# Patient Record
Sex: Male | Born: 1985
Health system: Southern US, Community
[De-identification: ages and names within clinical notes are randomized; demographics above are authoritative.]

## PROBLEM LIST (undated history)

## (undated) DIAGNOSIS — E7800 Pure hypercholesterolemia, unspecified: Secondary | ICD-10-CM

## (undated) DIAGNOSIS — I10 Essential (primary) hypertension: Secondary | ICD-10-CM

## (undated) DIAGNOSIS — F909 Attention-deficit hyperactivity disorder, unspecified type: Secondary | ICD-10-CM

## (undated) DIAGNOSIS — Z992 Dependence on renal dialysis: Secondary | ICD-10-CM

## (undated) DIAGNOSIS — N186 End stage renal disease: Secondary | ICD-10-CM

## (undated) HISTORY — PX: FRACTURE SURGERY: SHX138

## (undated) HISTORY — DX: End stage renal disease: N18.6

## (undated) HISTORY — DX: End stage renal disease: Z99.2

---
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
## 2006-03-03 ENCOUNTER — Emergency Department (HOSPITAL_COMMUNITY): Admission: EM | Admit: 2006-03-03 | Discharge: 2006-03-03 | Payer: Self-pay | Admitting: Emergency Medicine

## 2007-10-11 ENCOUNTER — Inpatient Hospital Stay (HOSPITAL_COMMUNITY): Admission: EM | Admit: 2007-10-11 | Discharge: 2007-10-14 | Payer: Self-pay | Admitting: Emergency Medicine

## 2008-05-30 ENCOUNTER — Emergency Department (HOSPITAL_COMMUNITY): Admission: EM | Admit: 2008-05-30 | Discharge: 2008-05-30 | Payer: Self-pay | Admitting: Emergency Medicine

## 2009-02-17 ENCOUNTER — Observation Stay (HOSPITAL_COMMUNITY): Admission: EM | Admit: 2009-02-17 | Discharge: 2009-02-17 | Payer: Self-pay | Admitting: Emergency Medicine

## 2010-10-17 LAB — POCT I-STAT, CHEM 8
Glucose, Bld: 643 mg/dL (ref 70–99)
HCT: 47 % (ref 39.0–52.0)
Hemoglobin: 16 g/dL (ref 13.0–17.0)
Potassium: 4 mEq/L (ref 3.5–5.1)
TCO2: 22 mmol/L (ref 0–100)

## 2010-10-17 LAB — URINALYSIS, ROUTINE W REFLEX MICROSCOPIC
Leukocytes, UA: NEGATIVE
Nitrite: NEGATIVE
Specific Gravity, Urine: 1.042 — ABNORMAL HIGH (ref 1.005–1.030)
Urobilinogen, UA: 0.2 mg/dL (ref 0.0–1.0)
pH: 5 (ref 5.0–8.0)

## 2010-10-17 LAB — CBC
HCT: 42.7 % (ref 39.0–52.0)
MCHC: 33.5 g/dL (ref 30.0–36.0)
MCV: 80.9 fL (ref 78.0–100.0)
Platelets: 276 10*3/uL (ref 150–400)
RBC: 5.27 MIL/uL (ref 4.22–5.81)

## 2010-10-17 LAB — DIFFERENTIAL
Basophils Relative: 0 % (ref 0–1)
Eosinophils Absolute: 0 10*3/uL (ref 0.0–0.7)
Eosinophils Relative: 1 % (ref 0–5)
Monocytes Relative: 7 % (ref 3–12)
Neutrophils Relative %: 69 % (ref 43–77)

## 2010-10-17 LAB — GLUCOSE, CAPILLARY
Glucose-Capillary: 161 mg/dL — ABNORMAL HIGH (ref 70–99)
Glucose-Capillary: 290 mg/dL — ABNORMAL HIGH (ref 70–99)
Glucose-Capillary: 351 mg/dL — ABNORMAL HIGH (ref 70–99)

## 2010-10-17 LAB — POCT I-STAT 3, VENOUS BLOOD GAS (G3P V)
Bicarbonate: 25.1 mEq/L — ABNORMAL HIGH (ref 20.0–24.0)
TCO2: 26 mmol/L (ref 0–100)
pCO2, Ven: 40 mmHg — ABNORMAL LOW (ref 45.0–50.0)
pH, Ven: 7.405 — ABNORMAL HIGH (ref 7.250–7.300)
pO2, Ven: 48 mmHg — ABNORMAL HIGH (ref 30.0–45.0)

## 2010-10-17 LAB — BASIC METABOLIC PANEL
CO2: 24 mEq/L (ref 19–32)
Chloride: 95 mEq/L — ABNORMAL LOW (ref 96–112)
Creatinine, Ser: 0.79 mg/dL (ref 0.4–1.5)
GFR calc Af Amer: >60 mL/min (ref >60)
Potassium: 4 mEq/L (ref 3.5–5.1)
Sodium: 130 mEq/L — ABNORMAL LOW (ref 135–145)

## 2010-10-17 LAB — CULTURE, ROUTINE-ABSCESS

## 2010-10-17 LAB — URINE MICROSCOPIC-ADD ON

## 2010-10-17 LAB — KETONES, QUALITATIVE: Acetone, Bld: NEGATIVE

## 2010-11-24 NOTE — H&P (Signed)
NAME:  [name], [name]               ACCOUNT NO.:  [id number]   MEDICAL RECORD NO.:  [id number]          PATIENT TYPE:  INP   LOCATION:  [id number]                         FACILITY:  [facility]   PHYSICIAN:  Hind I Elsaid, MD      DATE OF BIRTH:  [birth year]   DATE OF ADMISSION:  10/11/2007  DATE OF DISCHARGE:                              HISTORY & PHYSICAL   PRIMARY CARE PHYSICIAN:  Has no primary care.   CHIEF COMPLAINT:  _weakness  and polyuria for three weeks.   HISTORY OF PRESENT ILLNESS:  This is a 25 year old African-American male  with no previous medical history admitted through the emergency room  today with chief complaint of excessive thirst and fatigue.  Also the  patient noticed polyuria and dry mouth.  The patient has to check his  finger stick from his grandfather.  Yesterday he was found elevated at  400 and decided to come to the emergency room for further evaluation.  The patient denies any nausea, vomiting, or abdominal pain.  He denies  any fever.   PAST MEDICAL HISTORY:  None.   ALLERGIES:  No known drug allergies.   PAST SURGICAL HISTORY:  Status post left wrist fracture four years ago.   FAMILY HISTORY:  Significant for diabetes mellitus in his grandfather  and his sister.   SOCIAL HISTORY:  He lives with his grandfather.  No history of smoking,  no history of drug abuse, no history of alcohol.  He drinks alcohol  occasionally.   PHYSICAL EXAMINATION:  VITAL SIGNS:  Temperature 97, blood pressure  155/69, pulse rate 83, respiratory rate 20, saturating 97% on room air.  HEENT:  No pallor, no jaundice, not cyanotic.  RESPIRATORY:  _normal .  HEART:  S1/S2.  No added sound on examination.  LUNGS:  Normal vesicular breathing with equal air entry.  ABDOMEN:  Soft, nontender.  Bowel sounds positive.  EXTREMITIES:  Peripheral pulses intact.  No edema.  CENTRAL NERVOUS SYSTEM:  The patient is alert and oriented x3 with no  focal neurological findings.   LABORATORY  DATA:  Ouk blood cells 6.7, hemoglobin 13.7, hematocrit  40.5, platelets 267.  Sodium 134, potassium 4.7, chloride 100, carbon  dioxide 27, glucose 494, BUN 7, creatinine 0.99, calcium 8.6, total  protein 6.6, and albumin 3.8.   ASSESSMENT/PLAN:  1. New onset diabetes mellitus.  The patient will be admitted to      Med/Surg floor.  Continue with Glucommander.  Aggressive IV fluids.      Will start the patient on Lantus insulin 20 units subcu at bedtime.      Will get hemoglobin      123456, metabolic profile, and TSH.  The __________ location will be      addressed as inpatient.  2. Mild elevated blood pressure.  Will hold off on any blood pressure      medication at this time.  We may need to start ACE inhibitors.  DVT      and GI prophylaxis.      Hind I Laurie Panda, MD  Electronically Signed  HIE/MEDQ  D:  10/11/2007  T:  10/11/2007  Job:  [id number]
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
## 2010-11-27 NOTE — Discharge Summary (Signed)
NAME:  [name], [name]               ACCOUNT NO.:  [id number]   MEDICAL RECORD NO.:  [id number]          PATIENT TYPE:  INP   LOCATION:  [id number]                         FACILITY:  [facility]   PHYSICIAN:  Hind I Elsaid, MD      DATE OF BIRTH:  [birth year]   DATE OF ADMISSION:  10/11/2007  DATE OF DISCHARGE:  10/14/2007                               DISCHARGE SUMMARY   DISCHARGE DIAGNOSES:  1. Diabetic ketoacidosis.  2. New-onset diabetes mellitus.  3. Hyperlipidemia.   DISCHARGE MEDICATIONS:  1. Insulin Lantus 75 units subcu nightly.  2. Zocor 20 mg daily.   CONSULTATIONS:  None.   PROCEDURES:  None.   HISTORY OF PRESENT ILLNESS:  Please review the history and physical done  by Dr. Donia Ast.   SUMMARY:  This is a 26 year old African American male with no  significant medical history, admitted to the emergency room with chief  complaint excessive thirst and fatigue, found to have elevated blood  sugar around 400.  The patient was admitted for evaluation of elevated  diabetes and the patient was diagnosed as new-onset diabetes mellitus.  The patient was started on aggressive IV fluids with gluco monitor.  Hemoglobin C1 was found to be 12.8.  The patient was started on insulin  Lantus 75 units subcu nightly.  With insulin sliding scale with coverage  t.i.d. a.c. and nightly.  Diabetic education was done during  hospitalization.  The patient was prescribed Accu-Chek machine and  advised to follow with his primary care physician for further adjustment  of his insulin.  Diabetic education was done during hospitalization.  Hyperlipidemia, the patient found to have LDL of 127 and HDL of 22.  Accordingly, the patient was started on Zocor 20 mg p.o. nightly.  The  patient was advised and informed about complications of statin and the  patient was advised to follow regular LFTs with his primary care  physician.  It was felt that the patient received enough hospital  medical management,  further recommendation to be addressed as  outpatient.      Hind Franco Collet, MD  Electronically Signed     HIE/MEDQ  D:  11/09/2007  T:  11/09/2007  Job:  [id number]

## 2011-04-06 LAB — URINALYSIS, ROUTINE W REFLEX MICROSCOPIC
Bilirubin Urine: NEGATIVE
Glucose, UA: >1000 — AB
Ketones, ur: >80 — AB
Protein, ur: NEGATIVE
pH: 5.5

## 2011-04-06 LAB — COMPREHENSIVE METABOLIC PANEL
ALT: 42
ALT: 42
AST: 25
AST: 29
Albumin: 3.1 — ABNORMAL LOW
Alkaline Phosphatase: 83
CO2: 25
CO2: 27
Calcium: 8.3 — ABNORMAL LOW
Calcium: 8.6
GFR calc Af Amer: >60
GFR calc Af Amer: >60
GFR calc non Af Amer: >60
GFR calc non Af Amer: >60
Glucose, Bld: 494 — ABNORMAL HIGH
Potassium: 4.7
Sodium: 134 — ABNORMAL LOW
Sodium: 136
Total Protein: 6.1
Total Protein: 6.6

## 2011-04-06 LAB — CBC
HCT: 38.3 — ABNORMAL LOW
Hemoglobin: 12.8 — ABNORMAL LOW
Hemoglobin: 13.7
MCHC: 33.7
RBC: 4.87
RBC: 5.18
WBC: 6.7
WBC: 7

## 2011-04-06 LAB — URINE MICROSCOPIC-ADD ON

## 2011-04-06 LAB — DIFFERENTIAL
Basophils Relative: 0
Eosinophils Absolute: 0.1
Eosinophils Relative: 1
Lymphs Abs: 2.1
Monocytes Relative: 6
Neutrophils Relative %: 62

## 2011-04-06 LAB — BASIC METABOLIC PANEL
BUN: 7
CO2: 26
Chloride: 103
Chloride: 98
Glucose, Bld: 129 — ABNORMAL HIGH
Glucose, Bld: 310 — ABNORMAL HIGH
Potassium: 3.2 — ABNORMAL LOW
Potassium: 3.4 — ABNORMAL LOW
Sodium: 137

## 2011-04-06 LAB — TSH: TSH: 0.957

## 2011-04-06 LAB — LIPID PANEL: Cholesterol: 165

## 2011-04-06 LAB — PHOSPHORUS: Phosphorus: 4

## 2011-04-06 LAB — PROTIME-INR
INR: 1
Prothrombin Time: 13

## 2011-04-13 LAB — GLUCOSE, CAPILLARY

## 2011-10-25 ENCOUNTER — Emergency Department (HOSPITAL_COMMUNITY)
Admission: EM | Admit: 2011-10-25 | Discharge: 2011-10-25 | Disposition: A | Discharge status: Home / Self Care | Payer: Self-pay | Attending: Emergency Medicine | Admitting: Emergency Medicine

## 2011-10-25 ENCOUNTER — Encounter (HOSPITAL_COMMUNITY): Payer: Self-pay | Admitting: Emergency Medicine

## 2011-10-25 DIAGNOSIS — R739 Hyperglycemia, unspecified: Secondary | ICD-10-CM

## 2011-10-25 DIAGNOSIS — E119 Type 2 diabetes mellitus without complications: Secondary | ICD-10-CM | POA: Insufficient documentation

## 2011-10-25 DIAGNOSIS — Z794 Long term (current) use of insulin: Secondary | ICD-10-CM | POA: Insufficient documentation

## 2011-10-25 HISTORY — DX: Pure hypercholesterolemia, unspecified: E78.00

## 2011-10-25 LAB — CBC
HCT: 47.7 % (ref 39.0–52.0)
Hemoglobin: 16.1 g/dL (ref 13.0–17.0)
RDW: 13 % (ref 11.5–15.5)
WBC: 5.8 10*3/uL (ref 4.0–10.5)

## 2011-10-25 LAB — DIFFERENTIAL
Basophils Absolute: 0 10*3/uL (ref 0.0–0.1)
Lymphocytes Relative: 43 % (ref 12–46)
Monocytes Absolute: 0.5 10*3/uL (ref 0.1–1.0)
Monocytes Relative: 8 % (ref 3–12)
Neutro Abs: 2.8 10*3/uL (ref 1.7–7.7)
Neutrophils Relative %: 48 % (ref 43–77)

## 2011-10-25 LAB — BASIC METABOLIC PANEL
CO2: 27 mEq/L (ref 19–32)
Chloride: 94 mEq/L — ABNORMAL LOW (ref 96–112)
Creatinine, Ser: 0.64 mg/dL (ref 0.50–1.35)
GFR calc Af Amer: >90 mL/min (ref >90)
Potassium: 4.1 mEq/L (ref 3.5–5.1)

## 2011-10-25 LAB — GLUCOSE, CAPILLARY: Glucose-Capillary: 397 mg/dL — ABNORMAL HIGH (ref 70–99)

## 2011-10-25 MED ORDER — SODIUM CHLORIDE 0.9 % IV BOLUS (SEPSIS)
1000.0000 mL | Freq: Once | INTRAVENOUS | Status: AC
  Administered 2011-10-25: 1000 mL via INTRAVENOUS

## 2011-10-25 MED ORDER — INSULIN ASPART 100 UNIT/ML ~~LOC~~ SOLN
6.0000 [IU] | Freq: Once | SUBCUTANEOUS | Status: AC
  Administered 2011-10-25: 6 [IU] via SUBCUTANEOUS
  Filled 2011-10-25: qty 1

## 2011-10-25 MED ORDER — INSULIN GLARGINE 100 UNIT/ML ~~LOC~~ SOLN: 10.0000 [IU] | Freq: Every day | SUBCUTANEOUS | Status: DC

## 2011-10-25 NOTE — ED Provider Notes (Signed)
History     CSN: [id number]  Arrival date & time 10/25/11  1253   First MD Initiated Contact with Patient 10/25/11 1444      Chief Complaint  Patient presents with  . Hyperglycemia     The history is provided by the patient.  hyperglycemia Onset - an unknown time ago Course - worsening Improved by - nothing Worsened by - nothing Associated symptoms - nausea, polyuria, polydipsia  Pt reports his glucose has been elevated for "awhile" with associated polyuria/polydipsia Also reports mild nausea No cp/sob/cough Reports mild abd pain He also reports weight loss but does not quantify this He is supposed to take lantus 35 units at bedtime but has not done this recently. Denies vomiting/diarrhea   Past Medical History  Diagnosis Date  . Diabetes mellitus   . High cholesterol     No past surgical history on file.  No family history on file.  History  Substance Use Topics  . Smoking status: Never Smoker   . Smokeless tobacco: Not on file  . Alcohol Use: Yes      Review of Systems  All other systems reviewed and are negative.    Allergies  Review of patient's allergies indicates no known allergies.  Home Medications  No current outpatient prescriptions on file.  BP 127/83  Pulse 109  Temp(Src) 98 F (36.7 C) (Oral)  SpO2 100% BP 122/74  Pulse 100  Temp(Src) 98 F (36.7 C) (Oral)  Resp 20  SpO2 100%  Physical Exam CONSTITUTIONAL: Well developed/well nourished HEAD AND FACE: Normocephalic/atraumatic EYES: EOMI/PERRL ENMT: Mucous membranes dry NECK: supple no meningeal signs SPINE:entire spine nontender CV: S1/S2 noted, no murmurs/rubs/gallops noted LUNGS: Lungs are clear to auscultation bilaterally, no apparent distress ABDOMEN: soft, nontender, no rebound or guarding GU:no cva tenderness NEURO: Pt is awake/alert, moves all extremitiesx4 EXTREMITIES: pulses normal, full ROM.  No abscess/cellultiis to his extremities SKIN: warm, color  normal PSYCH: no abnormalities of mood noted  ED Course  Procedures   Labs Reviewed  GLUCOSE, CAPILLARY - Abnormal; Notable for the following:    Glucose-Capillary 397 (*)    All other components within normal limits  CBC  DIFFERENTIAL  BASIC METABOLIC PANEL   [id number] PM Pt with hyperglycemia due to medical noncompliance Will check labs and reassess   Pt well appearing, he feels improved no signs of DKA I refilled his lantus (started at 10units QHS) and advised close f/u with PCP and referral info given  MDM  Nursing notes reviewed and considered in documentation All labs/vitals reviewed and considered         Sharyon Cable, MD 10/25/11 1836

## 2011-10-25 NOTE — Discharge Instructions (Signed)
RESOURCE GUIDE  Dental Problems  Patients with Medicaid: [place] [facility] Phone:  [phone]                                                  Phone:  [phone]  If unable to pay or uninsured, contact:  [facility] or [facility]. to become qualified for the adult dental clinic.  Chronic Pain Problems Contact [facility]  [phone] Patients need to be referred by their primary care doctor.  Insufficient Money for Medicine Contact [employer]:  call "211" or [place] [phone].  No Primary Care Doctor Call [facility]  [phone] Other agencies that provide inexpensive medical care    [place]  [phone]    [facility]  [place]  [phone]    [facility]  [phone]    [facility]  [place]  [place]  [phone] [place]   [phone] (emergency services [phone])  [facility]  [phone] [facility] [phone] [facility] [place] [phone] [place] [phone] Residential & Outpatient Substance Abuse Program  [phone]  Abuse/Neglect [place] [phone] [place] [phone] (After Hours)  Emergency [place] [phone]  [place] [facility] [place] [phone] [place] [phone]  MRSA Hotline #:   6406149115    [facility] Dept. [address]      [place] Phone:  [phone]                                   Phone:  [phone]                 Phone:  [place] Phone:  [place] [phone] (After Hours)

## 2011-10-25 NOTE — ED Notes (Signed)
For 3-4 week has had freq urination has lost weight has  Been voiding a lot. States was dx w/ high sugar  3 years ago does not flollow up w/anyone

## 2013-10-02 ENCOUNTER — Encounter (HOSPITAL_COMMUNITY): Payer: Self-pay | Admitting: Emergency Medicine

## 2013-10-02 ENCOUNTER — Emergency Department (HOSPITAL_COMMUNITY)
Admission: EM | Admit: 2013-10-02 | Discharge: 2013-10-02 | Disposition: A | Discharge status: Home / Self Care | Payer: Self-pay | Attending: Emergency Medicine | Admitting: Emergency Medicine

## 2013-10-02 DIAGNOSIS — R739 Hyperglycemia, unspecified: Secondary | ICD-10-CM

## 2013-10-02 DIAGNOSIS — Z9119 Patient's noncompliance with other medical treatment and regimen: Secondary | ICD-10-CM | POA: Insufficient documentation

## 2013-10-02 DIAGNOSIS — Z794 Long term (current) use of insulin: Secondary | ICD-10-CM | POA: Insufficient documentation

## 2013-10-02 DIAGNOSIS — Z91199 Patient's noncompliance with other medical treatment and regimen due to unspecified reason: Secondary | ICD-10-CM | POA: Insufficient documentation

## 2013-10-02 DIAGNOSIS — E1165 Type 2 diabetes mellitus with hyperglycemia: Principal | ICD-10-CM

## 2013-10-02 DIAGNOSIS — E119 Type 2 diabetes mellitus without complications: Secondary | ICD-10-CM

## 2013-10-02 DIAGNOSIS — E860 Dehydration: Secondary | ICD-10-CM | POA: Insufficient documentation

## 2013-10-02 DIAGNOSIS — E7800 Pure hypercholesterolemia, unspecified: Secondary | ICD-10-CM | POA: Insufficient documentation

## 2013-10-02 DIAGNOSIS — R630 Anorexia: Secondary | ICD-10-CM | POA: Insufficient documentation

## 2013-10-02 DIAGNOSIS — IMO0001 Reserved for inherently not codable concepts without codable children: Secondary | ICD-10-CM | POA: Insufficient documentation

## 2013-10-02 LAB — COMPREHENSIVE METABOLIC PANEL
ALBUMIN: 4.1 g/dL (ref 3.5–5.2)
ALT: 16 U/L (ref 0–53)
AST: 14 U/L (ref 0–37)
Alkaline Phosphatase: 84 U/L (ref 39–117)
BUN: 15 mg/dL (ref 6–23)
CALCIUM: 9.4 mg/dL (ref 8.4–10.5)
CO2: 23 mEq/L (ref 19–32)
CREATININE: 0.59 mg/dL (ref 0.50–1.35)
Chloride: 93 mEq/L — ABNORMAL LOW (ref 96–112)
GFR calc Af Amer: >90 mL/min (ref >90)
GFR calc non Af Amer: >90 mL/min (ref >90)
Glucose, Bld: 316 mg/dL — ABNORMAL HIGH (ref 70–99)
Potassium: 4.1 mEq/L (ref 3.7–5.3)
Sodium: 135 mEq/L — ABNORMAL LOW (ref 137–147)
TOTAL PROTEIN: 7.6 g/dL (ref 6.0–8.3)
Total Bilirubin: 0.5 mg/dL (ref 0.3–1.2)

## 2013-10-02 LAB — CBC
HCT: 43.3 % (ref 39.0–52.0)
Hemoglobin: 15 g/dL (ref 13.0–17.0)
MCH: 27.4 pg (ref 26.0–34.0)
MCHC: 34.6 g/dL (ref 30.0–36.0)
MCV: 79 fL (ref 78.0–100.0)
PLATELETS: 293 10*3/uL (ref 150–400)
RBC: 5.48 MIL/uL (ref 4.22–5.81)
RDW: 13.5 % (ref 11.5–15.5)
WBC: 7.9 10*3/uL (ref 4.0–10.5)

## 2013-10-02 LAB — URINALYSIS, ROUTINE W REFLEX MICROSCOPIC
Bilirubin Urine: NEGATIVE
HGB URINE DIPSTICK: NEGATIVE
Ketones, ur: >80 mg/dL — AB
Leukocytes, UA: NEGATIVE
Nitrite: NEGATIVE
Protein, ur: 30 mg/dL — AB
SPECIFIC GRAVITY, URINE: 1.03 (ref 1.005–1.030)
UROBILINOGEN UA: 0.2 mg/dL (ref 0.0–1.0)
pH: 5 (ref 5.0–8.0)

## 2013-10-02 LAB — CBG MONITORING, ED
GLUCOSE-CAPILLARY: 256 mg/dL — AB (ref 70–99)
GLUCOSE-CAPILLARY: 266 mg/dL — AB (ref 70–99)
Glucose-Capillary: 302 mg/dL — ABNORMAL HIGH (ref 70–99)

## 2013-10-02 LAB — URINE MICROSCOPIC-ADD ON

## 2013-10-02 MED ORDER — SODIUM CHLORIDE 0.9 % IV BOLUS (SEPSIS)
1000.0000 mL | Freq: Once | INTRAVENOUS | Status: AC
  Administered 2013-10-02: 1000 mL via INTRAVENOUS

## 2013-10-02 NOTE — ED Notes (Addendum)
Pt reports not feeling well for a few days, feels like cbg is high but doesn't check it at home. Reports that he hasnt taken his lantus in over a month bc he doesn't like sticking himself. Having thirst, fatigue and nausea. cbg 302 at triage

## 2013-10-02 NOTE — Discharge Instructions (Signed)
Please call and set-up an appointment with [facility] [place] Please rest and stay hydrated Please continue to monitor glucose levels daily - this is very important in diabetic patients.  Please continue to take Lantus as prescribed daily  Please continue to stick with a proper diet for diabetes Please continue to monitor symptoms and if symptoms are to worsen or change (fever greater than 101, chills, sweating, nausea, vomiting, diarrhea, chest pain, shortness of breath difficulty breathing, elevated blood glucose levels, neck pain, neck stiffness, stomach pain, weakness, numbness, tingling, fainting, blurred vision, sudden loss of vision, headache) please report back to the ED immediately    Blood Glucose Monitoring, Adult Monitoring your blood glucose (also know as blood sugar) helps you to manage your diabetes. It also helps you and your health care provider monitor your diabetes and determine how well your treatment plan is working. WHY SHOULD YOU MONITOR YOUR BLOOD GLUCOSE?  It can help you understand how food, exercise, and medicine affect your blood glucose.  It allows you to know what your blood glucose is at any given moment. You can quickly tell if you are having low blood glucose (hypoglycemia) or high blood glucose (hyperglycemia).  It can help you and your health care provider know how to adjust your medicines.  It can help you understand how to manage an illness or adjust medicine for exercise. WHEN SHOULD YOU TEST? Your health care provider will help you decide how often you should check your blood glucose. This may depend on the type of diabetes you have, your diabetes control, or the types of medicines you are taking. Be sure to write down all of your blood glucose readings so that this information can be reviewed with your health care provider. See below for examples of testing times that your health care provider may suggest. Type 1 Diabetes  Test 4 times a day if  you are in good control, using an insulin pump, or perform multiple daily injections.  If your diabetes is not well-controlled or if you are sick, you may need to monitor more often.  It is a good idea to also monitor:  Before and after exercise.  Between meals and 2 hours after a meal.  Occasionally between 2:00 to 3:00 am. Type 2 Diabetes  It can vary with each person, but generally, if you are on insulin, test 4 times a day.  If you take medicines by mouth (orally), test 2 times a day.  If you are on a controlled diet, test once a day.  If your diabetes is not well controlled or if you are sick, you may need to monitor more often. HOW TO MONITOR YOUR BLOOD GLUCOSE Supplies Needed  Blood glucose meter.  Test strips for your meter. Each meter has its own strips. You must use the strips that go with your own meter.  A pricking needle (lancet).  A device that holds the lancet (lancing device).  A journal or log book to write down your results. Procedure  Wash your hands with soap and water. Alcohol is not preferred.  Prick the side of your finger (not the tip) with the lancet.  Gently milk the finger until a small drop of blood appears.  Follow the instructions that come with your meter for inserting the test strip, applying blood to the strip, and using your blood glucose meter. Other Areas to Get Blood for Testing Some meters allow you to use other areas of your body (other than your  finger) to test your blood. These areas are called alternative sites. The most common alternative sites are:  The forearm.  The thigh.  The back area of the lower leg.  The palm of the hand. The blood flow in these areas is slower. Therefore, the blood glucose values you get may be delayed, and the numbers are different from what you would get from your fingers. Do not use alternative sites if you think you are having hypoglycemia. Your reading will not be accurate. Always use a finger  if you are having hypoglycemia. Also, if you cannot feel your lows (hypoglycemia unawareness), always use your fingers for your blood glucose checks. ADDITIONAL TIPS FOR GLUCOSE MONITORING  Do not reuse lancets.  Always carry your supplies with you.  All blood glucose meters have a 24-hour "hotline" number to call if you have questions or need help.  Adjust (calibrate) your blood glucose meter with a control solution after finishing a few boxes of strips. BLOOD GLUCOSE RECORD KEEPING It is a good idea to keep a daily record or log of your blood glucose readings. Most glucose meters, if not all, keep your glucose records stored in the meter. Some meters come with the ability to download your records to your home computer. Keeping a record of your blood glucose readings is especially helpful if you are wanting to look for patterns. Make notes to go along with the blood glucose readings because you might forget what happened at that exact time. Keeping good records helps you and your health care provider to work together to achieve good diabetes management.  Document Released: 07/01/2003 Document Revised: 02/28/2013 Document Reviewed: 11/20/2012 [facility] Patient Information 2014 [place]. Type 2 Diabetes Mellitus, Adult Type 2 diabetes mellitus, often simply referred to as type 2 diabetes, is a long-lasting (chronic) disease. In type 2 diabetes, the pancreas does not make enough insulin (a hormone), the cells are less responsive to the insulin that is made (insulin resistance), or both. Normally, insulin moves sugars from food into the tissue cells. The tissue cells use the sugars for energy. The lack of insulin or the lack of normal response to insulin causes excess sugars to build up in the blood instead of going into the tissue cells. As a result, high blood sugar (hyperglycemia) develops. The effect of high sugar (glucose) levels can cause many complications. Type 2 diabetes was also  previously called adult-onset diabetes but it can occur at any age.  RISK FACTORS  A person is predisposed to developing type 2 diabetes if someone in the family has the disease and also has one or more of the following primary risk factors:  Overweight.  An inactive lifestyle.  A history of consistently eating high-calorie foods. Maintaining a normal weight and regular physical activity can reduce the chance of developing type 2 diabetes. SYMPTOMS  A person with type 2 diabetes may not show symptoms initially. The symptoms of type 2 diabetes appear slowly. The symptoms include:  Increased thirst (polydipsia).  Increased urination (polyuria).  Increased urination during the night (nocturia).  Weight loss. This weight loss may be rapid.  Frequent, recurring infections.  Tiredness (fatigue).  Weakness.  Vision changes, such as blurred vision.  Fruity smell to your breath.  Abdominal pain.  Nausea or vomiting.  Cuts or bruises which are slow to heal.  Tingling or numbness in the hands or feet. DIAGNOSIS Type 2 diabetes is frequently not diagnosed until complications of diabetes are present. Type 2 diabetes is diagnosed  when symptoms or complications are present and when blood glucose levels are increased. Your blood glucose level may be checked by one or more of the following blood tests:  A fasting blood glucose test. You will not be allowed to eat for at least 8 hours before a blood sample is taken.  A random blood glucose test. Your blood glucose is checked at any time of the day regardless of when you ate.  A hemoglobin A1c blood glucose test. A hemoglobin A1c test provides information about blood glucose control over the previous 3 months.  An oral glucose tolerance test (OGTT). Your blood glucose is measured after you have not eaten (fasted) for 2 hours and then after you drink a glucose-containing beverage. TREATMENT   You may need to take insulin or diabetes  medicine daily to keep blood glucose levels in the desired range.  You will need to match insulin dosing with exercise and healthy food choices. The treatment goal is to maintain the before meal blood sugar (preprandial glucose) level at 70 130 mg/dL. HOME CARE INSTRUCTIONS   Have your hemoglobin A1c level checked twice a year.  Perform daily blood glucose monitoring as directed by your caregiver.  Monitor urine ketones when you are ill and as directed by your caregiver.  Take your diabetes medicine or insulin as directed by your caregiver to maintain your blood glucose levels in the desired range.  Never run out of diabetes medicine or insulin. It is needed every day.  Adjust insulin based on your intake of carbohydrates. Carbohydrates can raise blood glucose levels but need to be included in your diet. Carbohydrates provide vitamins, minerals, and fiber which are an essential part of a healthy diet. Carbohydrates are found in fruits, vegetables, whole grains, dairy products, legumes, and foods containing added sugars.    Eat healthy foods. Alternate 3 meals with 3 snacks.  Lose weight if overweight.  Carry a medical alert card or wear your medical alert jewelry.  Carry a 15 gram carbohydrate snack with you at all times to treat low blood glucose (hypoglycemia). Some examples of 15 gram carbohydrate snacks include:  Glucose tablets, 3 or 4   Glucose gel, 15 gram tube  Raisins, 2 tablespoons (24 grams)  Jelly beans, 6  Animal crackers, 8  Regular pop, 4 ounces (120 mL)  Gummy treats, 9  Recognize hypoglycemia. Hypoglycemia occurs with blood glucose levels of 70 mg/dL and below. The risk for hypoglycemia increases when fasting or skipping meals, during or after intense exercise, and during sleep. Hypoglycemia symptoms can include:  Tremors or shakes.  Decreased ability to concentrate.  Sweating.  Increased heart rate.  Headache.  Dry  mouth.  Hunger.  Irritability.  Anxiety.  Restless sleep.  Altered speech or coordination.  Confusion.  Treat hypoglycemia promptly. If you are alert and able to safely swallow, follow the 15:15 rule:  Take 15 20 grams of rapid-acting glucose or carbohydrate. Rapid-acting options include glucose gel, glucose tablets, or 4 ounces (120 mL) of fruit juice, regular soda, or low fat milk.  Check your blood glucose level 15 minutes after taking the glucose.  Take 15 20 grams more of glucose if the repeat blood glucose level is still 70 mg/dL or below.  Eat a meal or snack within 1 hour once blood glucose levels return to normal.    Be alert to polyuria and polydipsia which are early signs of hyperglycemia. An early awareness of hyperglycemia allows for prompt treatment. Treat hyperglycemia as directed  by your caregiver.  Engage in at least 150 minutes of moderate-intensity physical activity a week, spread over at least 3 days of the week or as directed by your caregiver. In addition, you should engage in resistance exercise at least 2 times a week or as directed by your caregiver.  Adjust your medicine and food intake as needed if you start a new exercise or sport.  Follow your sick day plan at any time you are unable to eat or drink as usual.  Avoid tobacco use.  Limit alcohol intake to no more than 1 drink per day for nonpregnant women and 2 drinks per day for men. You should drink alcohol only when you are also eating food. Talk with your caregiver whether alcohol is safe for you. Tell your caregiver if you drink alcohol several times a week.  Follow up with your caregiver regularly.  Schedule an eye exam soon after the diagnosis of type 2 diabetes and then annually.  Perform daily skin and foot care. Examine your skin and feet daily for cuts, bruises, redness, nail problems, bleeding, blisters, or sores. A foot exam by a caregiver should be done annually.  Brush your teeth  and gums at least twice a day and floss at least once a day. Follow up with your dentist regularly.  Share your diabetes management plan with your workplace or school.  Stay up-to-date with immunizations.  Learn to manage stress.  Obtain ongoing diabetes education and support as needed.  Participate in, or seek rehabilitation as needed to maintain or improve independence and quality of life. Request a physical or occupational therapy referral if you are having foot or hand numbness or difficulties with grooming, dressing, eating, or physical activity. SEEK MEDICAL CARE IF:   You are unable to eat food or drink fluids for more than 6 hours.  You have nausea and vomiting for more than 6 hours.  Your blood glucose level is over 240 mg/dL.  There is a change in mental status.  You develop an additional serious illness.  You have diarrhea for more than 6 hours.  You have been sick or have had a fever for a couple of days and are not getting better.  You have pain during any physical activity.  SEEK IMMEDIATE MEDICAL CARE IF:  You have difficulty breathing.  You have moderate to large ketone levels. MAKE SURE YOU:  Understand these instructions.  Will watch your condition.  Will get help right away if you are not doing well or get worse. Document Released: 06/28/2005 Document Revised: 03/22/2012 Document Reviewed: 01/25/2012 [facility] Patient Information 2014 [place]. Diabetes Meal Planning Guide The diabetes meal planning guide is a tool to help you plan your meals and snacks. It is important for people with diabetes to manage their blood glucose (sugar) levels. Choosing the right foods and the right amounts throughout your day will help control your blood glucose. Eating right can even help you improve your blood pressure and reach or maintain a healthy weight. CARBOHYDRATE COUNTING MADE EASY When you eat carbohydrates, they turn to sugar. This raises your blood  glucose level. Counting carbohydrates can help you control this level so you feel better. When you plan your meals by counting carbohydrates, you can have more flexibility in what you eat and balance your medicine with your food intake. Carbohydrate counting simply means adding up the total amount of carbohydrate grams in your meals and snacks. Try to eat about the same amount at each meal.  Foods with carbohydrates are listed below. Each portion below is 1 carbohydrate serving or 15 grams of carbohydrates. Ask your dietician how many grams of carbohydrates you should eat at each meal or snack. Grains and Starches  1 slice bread.   English muffin or hotdog/hamburger bun.   cup cold cereal (unsweetened).   cup cooked pasta or rice.   cup starchy vegetables (corn, potatoes, peas, beans, winter squash).  1 tortilla (6 inches).   bagel.  1 waffle or pancake (size of a CD).   cup cooked cereal.  4 to 6 small crackers. *Whole grain is recommended. Fruit  1 cup fresh unsweetened berries, melon, papaya, pineapple.  1 small fresh fruit.   banana or mango.   cup fruit juice (4 oz unsweetened).   cup canned fruit in natural juice or water.  2 tbs dried fruit.  12 to 15 grapes or cherries. Milk and Yogurt  1 cup fat-free or 1% milk.  1 cup soy milk.  6 oz light yogurt with sugar-free sweetener.  6 oz low-fat soy yogurt.  6 oz plain yogurt. Vegetables  1 cup raw or  cup cooked is counted as 0 carbohydrates or a "free" food.  If you eat 3 or more servings at 1 meal, count them as 1 carbohydrate serving. Other Carbohydrates   oz chips or pretzels.   cup ice cream or frozen yogurt.   cup sherbet or sorbet.  2 inch square cake, no frosting.  1 tbs honey, sugar, jam, jelly, or syrup.  2 small cookies.  3 squares of graham crackers.  3 cups popcorn.  6 crackers.  1 cup broth-based soup.  Count 1 cup casserole or other mixed foods as 2 carbohydrate  servings.  Foods with less than 20 calories in a serving may be counted as 0 carbohydrates or a "free" food. You may want to purchase a book or computer software that lists the carbohydrate gram counts of different foods. In addition, the nutrition facts panel on the labels of the foods you eat are a good source of this information. The label will tell you how big the serving size is and the total number of carbohydrate grams you will be eating per serving. Divide this number by 15 to obtain the number of carbohydrate servings in a portion. Remember, 1 carbohydrate serving equals 15 grams of carbohydrate. SERVING SIZES Measuring foods and serving sizes helps you make sure you are getting the right amount of food. The list below tells how big or small some common serving sizes are.  1 oz.........4 stacked dice.  3 oz........Marland KitchenDeck of cards.  1 tsp.......Marland KitchenTip of little finger.  1 tbs......Marland KitchenMarland KitchenThumb.  2 tbs.......Marland KitchenGolf ball.   cup......Marland KitchenHalf of a fist.  1 cup.......Marland KitchenA fist. SAMPLE DIABETES MEAL PLAN Below is a sample meal plan that includes foods from the grain and starches, dairy, vegetable, fruit, and meat groups. A dietician can individualize a meal plan to fit your calorie needs and tell you the number of servings needed from each food group. However, controlling the total amount of carbohydrates in your meal or snack is more important than making sure you include all of the food groups at every meal. You may interchange carbohydrate containing foods (dairy, starches, and fruits). The meal plan below is an example of a 2000 calorie diet using carbohydrate counting. This meal plan has 17 carbohydrate servings. Breakfast  1 cup oatmeal (2 carb servings).   cup light yogurt (1 carb serving).  1 cup blueberries (1 carb  serving).   cup almonds. Snack  1 large apple (2 carb servings).  1 low-fat string cheese stick. Lunch  Chicken breast salad.  1 cup spinach.   cup chopped  tomatoes.  2 oz chicken breast, sliced.  2 tbs low-fat [place] dressing.  12 whole-wheat crackers (2 carb servings).  12 to 15 grapes (1 carb serving).  1 cup low-fat milk (1 carb serving). Snack  1 cup carrots.   cup hummus (1 carb serving). Dinner  3 oz broiled salmon.  1 cup brown rice (3 carb servings). Snack  1  cups steamed broccoli (1 carb serving) drizzled with 1 tsp olive oil and lemon juice.  1 cup light pudding (2 carb servings). DIABETES MEAL PLANNING WORKSHEET Your dietician can use this worksheet to help you decide how many servings of foods and what types of foods are right for you.  BREAKFAST Food Group and Servings / Carb Servings Grain/Starches __________________________________ Dairy __________________________________________ Vegetable ______________________________________ Fruit ___________________________________________ Meat __________________________________________ Fat ____________________________________________ LUNCH Food Group and Servings / Carb Servings Grain/Starches ___________________________________ Dairy ___________________________________________ Fruit ____________________________________________ Meat ___________________________________________ Fat _____________________________________________ Wonda Cheng Food Group and Servings / Carb Servings Grain/Starches ___________________________________ Dairy ___________________________________________ Fruit ____________________________________________ Meat ___________________________________________ Fat _____________________________________________ SNACKS Food Group and Servings / Carb Servings Grain/Starches ___________________________________ Dairy ___________________________________________ Vegetable _______________________________________ Fruit ____________________________________________ Meat ___________________________________________ Fat  _____________________________________________ DAILY TOTALS Starches _________________________ Vegetable ________________________ Fruit ____________________________ Dairy ____________________________ Meat ____________________________ Fat ______________________________ Document Released: 03/25/2005 Document Revised: 09/20/2011 Document Reviewed: 02/03/2009 [employer] Patient Information 2014 [employer].   Emergency Department Resource Guide 1) Find a Doctor and Pay Out of Pocket Although you won't have to find out who is covered by your insurance plan, it is a good idea to ask around and get recommendations. You will then need to call the office and see if the doctor you have chosen will accept you as a new patient and what types of options they offer for patients who are self-pay. Some doctors offer discounts or will set up payment plans for their patients who do not have insurance, but you will need to ask so you aren't surprised when you get to your appointment.  2) Contact Your Local Health Department Not all health departments have doctors that can see patients for sick visits, but many do, so it is worth a call to see if yours does. If you don't know where your local health department is, you can check in your phone book. The CDC also has a tool to help you locate your state's health department, and many state websites also have listings of all of their local health departments.  3) Find a [facility] If your illness is not likely to be very severe or complicated, you may want to try a walk in clinic. These are popping up all over the country in pharmacies, drugstores, and shopping centers. They're usually staffed by nurse practitioners or physician assistants that have been trained to treat common illnesses and complaints. They're usually fairly quick and inexpensive. However, if you have serious medical issues or chronic medical problems, these are probably not your best option.  No  Primary Care Doctor: - Call [facility] at  [phone] - they can help you locate a primary care doctor that  accepts your insurance, provides certain services, etc. - Physician Referral Service- [phone]  Chronic Pain Problems: Organization         Address  Phone   Notes  [facility]  [phone] Patients need to be referred by  their primary care doctor.   Medication Assistance: Organization         Address  Phone   Notes  [facility] Medication [facility] [address] [place], [place] [address] [phone] --Must be a resident of [facility] -- Must have NO insurance coverage whatsoever (no Medicaid/ Medicare, etc.) -- The pt. MUST have a primary care doctor that directs their care regularly and follows them in the community   [facility]  [phone]   [employer]  [phone]    Agencies that provide inexpensive medical care: Organization         Address  Phone   Notes  [facility]  [phone]   [facility]    [phone]   [facility] [place], [place] [address] [phone]   [place] [address] [place] [phone]   [facility]    [phone]   [facility]    [phone]   [place] [address], [place] Phone:  [phone], Fax:  [phone] Hours of Operation:  9 am - 6 pm, M-F.  Also accepts Medicaid/Medicare and self-pay.  [facility] for [place] [address] [place], [address], [place] Phone: [phone], Fax: [phone]. Hours of Operation:  8:30 am - 5:30 pm, M-F.  Also accepts Medicaid and self-pay.  [facility] [place] [address] [place] Phone: [phone]   [place], [place], [place] [phone], Ext. [phone] Mondays & Thursdays: 7-9 AM.  First 15 patients are seen on  a first come, first serve basis.    [place] Providers:  Organization         Address  Phone   Notes  [facility] [address], [place] [phone] Also accepts self-pay patients.  [facility] [id number] [place], [place]  [phone]   [place], [address], [place] [phone]   [facility] [address], [place] [phone]   Lucianne Lei [address], [place]   [phone] Only accepts [place] patients after they have their name applied to their card.   Self-Pay (no insurance) in [facility]:  Organization         Address  Phone   Notes  Sickle Cell Patients, [facility] [place] [phone]   [facility] [place] [phone]   [facility] [place]  [place], [place], [place] [phone]   [facility]/Dr. Osei-Bonsu  [address] [place] or [address], [place] [phone] Phone number for both [place] and [place] locations is the same.  [facility] [address] [place] [phone]   [facility] [address] [place] or [address] [phone]   [facility] [address] [place] [phone], phone; [phone], fax Sees patients 1st and 3rd Saturday of every month.  Must not qualify for public or private insurance (i.e. Medicaid, Medicare, [place] Health Choice, Veterans' Benefits)  Household income should be no more than 200%  of the poverty level The clinic cannot treat you if you are pregnant or think you are pregnant  Sexually transmitted diseases are not treated at the clinic.    Dental Care: Organization          Address  Phone  Notes  [facility] Department of [facility] [phone] Accepts children up to age 59 who are enrolled in [place] or [place]; pregnant women with a Medicaid card; and children who have applied for Medicaid or [place] Health Choice, but were declined, whose parents can pay a reduced fee at time of service.  [facility] Department of [facility]  [address], [place] [phone] Accepts children up to age 37 who are enrolled in [place] or [place]; pregnant women with a Medicaid card; and children who have applied for Medicaid or [place] Health Choice, but were declined, whose parents can pay a reduced fee at time of service.  [place] [facility]  [place] [phone] Patients are seen by appointment only. Walk-ins are not accepted. [place] will see patients 48 years of age and older. Monday - Tuesday (8am-5pm) Most Wednesdays (8:30-5pm) $30 per visit, cash only  [facility] Adult Dental Access PROGRAM  [address], [facility] [phone] Patients are seen by appointment only. Walk-ins are not accepted. [place] will see patients 62 years of age and older. One Wednesday Evening (Monthly: Volunteer Based).  $30 per visit, cash only  [place]  [phone] for adults; Children under age 16, call Graduate Pediatric Dentistry at [phone]. Children aged 70-14, please call [phone] to request a pediatric application.  Dental services are provided in all areas of dental care including fillings, crowns and bridges, complete and partial dentures, implants, gum treatment, root canals, and extractions. Preventive care is also provided. Treatment is provided to both adults and children. Patients are selected via a lottery and there is often a waiting list.   [facility] [address], [place]  [phone] [email or web address]   [facility] [address] [place], [place] [phone], Ext. [phone] Second and Fourth Thursday of each month, opens at 6:30 AM; Clinic ends at 9 AM.  Patients are seen on a first-come first-served basis, and a limited number are seen during each clinic.   [facility]  [address] Hillard Danker [place], [place] [phone]   Eligibility Requirements You must have lived in [place], [place], or [place] counties for at least the last three months.   You cannot be eligible for state or federal sponsored [employer], including [employer], [place], or [employer].   You generally cannot be eligible for healthcare insurance through your employer.    How to apply: Eligibility screenings are held every Tuesday and Wednesday afternoon from 1:00 pm until 4:00 pm. You do not need an appointment for the interview!  [facility] [address] [place], [employer]   [place]  [place] Department  [place]  [phone]    Behavioral Health Resources in the Community: Intensive Outpatient Programs Organization         Address  Phone  Notes  [place]. [address] [place], [place]   [facility]  [address] [place], [place]   [facility] [address], [place], [place]   [place] [address]  [place], [place] or [phone]   Substance Abuse Resources Organization         Address  Phone  Notes  [facility]  [phone]   [place]  [phone]   [facility]   Chinita Pester  [phone]   Residential & Outpatient Substance Abuse Program  [phone]   Psychological  Services Organization         Address  Phone  Notes  [facility] [place]  [place]  [phone]   [place] [address], [place] or [phone]    Mobile Crisis Teams Organization         Address  Phone  Notes  [facility], Mobile Crisis Care Unit  [phone]   [facility]  [address] [place], [place]   [place] [address], [place] [phone]    Self-Help/Support Groups Organization         Address  Phone             Notes  [place]. of [place] - variety of support groups  [place] Call for more information  Narcotics Anonymous (NA), [facility] [address], [employer] [place]  2 meetings at this location   Special educational needs teacher         Address  Phone  Notes  [facility] [place],    [place]  [phone]   [facility]  [address] [place] [address], [place] [id number], [place], [place]   [place], [place] [phone] Admissions: 8am-3pm M-F  Incentives Substance [place] [address]    [place], [place] [id number]   [facility] [address] [place], [place], [place]   [facility] [address],  [place], [place]   [facility] - Intensive Outpatient [address] Kristeen Mans [address], [place], [place]   [facility] ([place].,  [place], [place] [phone] or [phone]   [facility] ([facility]) [address], [place], [place] Accepts Medicaid  [facility] [place] [address],  [place] [phone] Substance Abuse/Addiction Treatment   [facility] Organization         Address  Phone  Notes  [facility]  [phone]   Domenic Schwab, PhD [address] Arlis Porta [place], [place]   [phone] or [phone]   [place], [place] [phone]   [facility] [address] [place], [place] [phone] Insurance/Medicaid/sponsorship through [facility] and Families [address] [place]                                    [place], [place] [phone] [place] [address], [place] [phone]    Dr. Adele Schilder  [phone]   [facility] [place] Dept. 1) [address], [place] 2) [place] 3)  [address]  [place], [place] [phone]  [phone]   [facility] Child Abuse Hotline 934-019-4671 or [phone] (After Hours)

## 2013-10-02 NOTE — ED Provider Notes (Signed)
CSN: [id number]     Arrival date & time 10/02/13  1545 History   First MD Initiated Contact with Patient 10/02/13 1705     Chief Complaint  Patient presents with  . Hyperglycemia     (Consider location/radiation/quality/duration/timing/severity/associated sxs/prior Treatment) The history is provided by the patient. No language interpreter was used.  [name] is a 28 year old male with past history of diabetes type 2 and hypercholesterolemia presenting to the ED with hyperglycemia. Patient reported that he's been feeling dizzy, weak, decreased appetite starting yesterday. Stated that he normally runs his blood sugar levels at 140. When asked when patient last checked his glucose level he said in December 2014. Reported that he takes his Lantus 30 units IM before bedtime every day. Reported that he has not seen a physician regarding his diabetes and has not seen anyone regarding monitoring. Denied nausea, vomiting, dizziness, abdominal pain, chest pain, shortness of breath, difficulty breathing, loss of vision, visual distortions, syncopal episode. PCP none  Past Medical History  Diagnosis Date  . Diabetes mellitus   . High cholesterol    History reviewed. No pertinent past surgical history. History reviewed. No pertinent family history. History  Substance Use Topics  . Smoking status: Never Smoker   . Smokeless tobacco: Not on file  . Alcohol Use: Yes    Review of Systems  Constitutional: Negative for fever, chills and fatigue.  Eyes: Negative for visual disturbance.  Respiratory: Negative for chest tightness and shortness of breath.   Cardiovascular: Negative for chest pain.  Gastrointestinal: Negative for nausea, vomiting, abdominal pain, diarrhea, constipation and blood in stool.  Genitourinary: Negative for hematuria and decreased urine volume.  Musculoskeletal: Negative for back pain and neck pain.  Neurological: Positive for weakness. Negative for headaches.  All  other systems reviewed and are negative.      Allergies  Review of patient's allergies indicates no known allergies.  Home Medications  No current outpatient prescriptions on file. BP 133/79  Pulse 77  Temp(Src) 98.5 F (36.9 C) (Oral)  Resp 15  SpO2 99% Physical Exam  Nursing note and vitals reviewed. Constitutional: He is oriented to person, place, and time. He appears well-developed and well-nourished. No distress.  HENT:  Head: Normocephalic and atraumatic.  Mouth/Throat: Oropharynx is clear and moist. No oropharyngeal exudate.  Dry mucus membranes noted  Eyes: Conjunctivae and EOM are normal. Pupils are equal, round, and reactive to light. Right eye exhibits no discharge. Left eye exhibits no discharge.  Negative nystagmus Visual fields grossly intact  Neck: Normal range of motion. Neck supple. No tracheal deviation present.  Negative neck stiffness Negative nuchal rigidity Negative cervical lymphadenopathy Negative meningeal signs  Cardiovascular: Normal rate, regular rhythm and normal heart sounds.  Exam reveals no friction rub.   No murmur heard. Pulses:      Radial pulses are 2+ on the right side, and 2+ on the left side.       Dorsalis pedis pulses are 2+ on the right side, and 2+ on the left side.  Cap refill less than 3 seconds  Pulmonary/Chest: Effort normal and breath sounds normal. No respiratory distress. He has no wheezes. He has no rales.  Abdominal: Soft. Bowel sounds are normal. There is no tenderness. There is no guarding.  Musculoskeletal: Normal range of motion.  Full ROM to upper and lower extremities without difficulty noted, negative ataxia noted.  Lymphadenopathy:    He has no cervical adenopathy.  Neurological: He is alert and oriented to person,  place, and time. No cranial nerve deficit. He exhibits normal muscle tone. Coordination normal.  Cranial nerves III-XII grossly intact Strength 5+/5+ to upper and lower extremities bilaterally with  resistance applied, equal distribution noted Sensation intact with differentiation sharp and dull touch Equal grip strength Negative arm drift Fine motor skills intact Heel to knee down shin normal bilaterally Gait proper, proper balance - negative sway, negative drift, negative step-offs  Skin: Skin is warm and dry. No rash noted. He is not diaphoretic. No erythema.  Psychiatric: He has a normal mood and affect. His behavior is normal. Thought content normal.    ED Course  Procedures (including critical care time)  6:43 PM Patient seen and assessed by attending physician who recommended that patient can be discharged - as per attending physician, cleared patient for discharge.  Results for orders placed during the hospital encounter of 10/02/13  CBC      Result Value Ref Range   WBC 7.9  4.0 - 10.5 K/uL   RBC 5.48  4.22 - 5.81 MIL/uL   Hemoglobin 15.0  13.0 - 17.0 g/dL   HCT 43.3  39.0 - 52.0 %   MCV 79.0  78.0 - 100.0 fL   MCH 27.4  26.0 - 34.0 pg   MCHC 34.6  30.0 - 36.0 g/dL   RDW 13.5  11.5 - 15.5 %   Platelets 293  150 - 400 K/uL  COMPREHENSIVE METABOLIC PANEL      Result Value Ref Range   Sodium 135 (*) 137 - 147 mEq/L   Potassium 4.1  3.7 - 5.3 mEq/L   Chloride 93 (*) 96 - 112 mEq/L   CO2 23  19 - 32 mEq/L   Glucose, Bld 316 (*) 70 - 99 mg/dL   BUN 15  6 - 23 mg/dL   Creatinine, Ser 0.59  0.50 - 1.35 mg/dL   Calcium 9.4  8.4 - 10.5 mg/dL   Total Protein 7.6  6.0 - 8.3 g/dL   Albumin 4.1  3.5 - 5.2 g/dL   AST 14  0 - 37 U/L   ALT 16  0 - 53 U/L   Alkaline Phosphatase 84  39 - 117 U/L   Total Bilirubin 0.5  0.3 - 1.2 mg/dL   GFR calc non Af Amer >90  >90 mL/min   GFR calc Af Amer >90  >90 mL/min  URINALYSIS, ROUTINE W REFLEX MICROSCOPIC      Result Value Ref Range   Color, Urine YELLOW  YELLOW   APPearance CLEAR  CLEAR   Specific Gravity, Urine 1.030  1.005 - 1.030   pH 5.0  5.0 - 8.0   Glucose, UA >1000 (*) NEGATIVE mg/dL   Hgb urine dipstick NEGATIVE   NEGATIVE   Bilirubin Urine NEGATIVE  NEGATIVE   Ketones, ur >80 (*) NEGATIVE mg/dL   Protein, ur 30 (*) NEGATIVE mg/dL   Urobilinogen, UA 0.2  0.0 - 1.0 mg/dL   Nitrite NEGATIVE  NEGATIVE   Leukocytes, UA NEGATIVE  NEGATIVE  URINE MICROSCOPIC-ADD ON      Result Value Ref Range   WBC, UA 0-2  <3 WBC/hpf   RBC / HPF 0-2  <3 RBC/hpf   Bacteria, UA RARE  RARE  CBG MONITORING, ED      Result Value Ref Range   Glucose-Capillary 302 (*) 70 - 99 mg/dL  CBG MONITORING, ED      Result Value Ref Range   Glucose-Capillary 256 (*) 70 - 99 mg/dL  CBG  MONITORING, ED      Result Value Ref Range   Glucose-Capillary 266 (*) 70 - 99 mg/dL    Labs Review Labs Reviewed  COMPREHENSIVE METABOLIC PANEL - Abnormal; Notable for the following:    Sodium 135 (*)    Chloride 93 (*)    Glucose, Bld 316 (*)    All other components within normal limits  URINALYSIS, ROUTINE W REFLEX MICROSCOPIC - Abnormal; Notable for the following:    Glucose, UA >1000 (*)    Ketones, ur >80 (*)    Protein, ur 30 (*)    All other components within normal limits  CBG MONITORING, ED - Abnormal; Notable for the following:    Glucose-Capillary 302 (*)    All other components within normal limits  CBG MONITORING, ED - Abnormal; Notable for the following:    Glucose-Capillary 256 (*)    All other components within normal limits  CBG MONITORING, ED - Abnormal; Notable for the following:    Glucose-Capillary 266 (*)    All other components within normal limits  CBC  URINE MICROSCOPIC-ADD ON   Imaging Review No results found.   EKG Interpretation None      MDM   Final diagnoses:  Hyperglycemia  Dehydration  DM (diabetes mellitus)  Noncompliance    Filed Vitals:   10/02/13 2030 10/02/13 2045 10/02/13 2100 10/02/13 2115  BP: 145/71 139/75 140/68 132/71  Pulse: 69 62 72 71  Temp:      TempSrc:      Resp:      SpO2: 95% 99% 99% 98%   Patient presenting to the ED with episode of hyperglycemia with a  glucose level of 302. Patient's history of diabetes type 2 and currently is on Lantus 30 units IM before bedtime. Stated he's been feeling dizzy, weak, decreased appetite that started last night into this morning. When asked the last time patient checked his blood glucose level he reported that was back in December 2014. Stated that he does not have a primary care provider for following his glucose level. Alert and oriented. GCS 15. Heart rate and rhythm normal. Lungs clear to auscultation to upper and lower lobes bilaterally. Radial and DP pulses 2+ bilaterally. Cap refill less than 3 seconds. Full range of motion to upper and lower extremities bilaterally without difficulty or ataxia noted. Negative nystagmus, visual fields grossly intact. Equal grip strength. Cranial nerves grossly intact. Sensation intact with differentiation sharp and dull touch. Patient able to bring finger to nose bilaterally. Patient didn't bring heel to knee down shin bilaterally without difficulty or ataxia. CBC negative elevated [place] blood cell count. CMP negative findings-normal functioning kidneys and liver. Anion gap of 19.0 mEq/L. CBG 302. After 1 L of fluid CBG down to 256. UA noted mild ketones with elevated glucose - negative nitrites or leukocytes noted.  Doubt DKA. Suspicion to be hyperglycemia secondary to poor control and monitoring of diabetes-noncompliance. Patient has not checked his blood sugar since December 2014. Patient stable, afebrile. Patient aseptic-appearing. This provider wanted to admit the patient secondary to poor complaince and recurrent hyperglycemia - attending physician saw and assessed patient who recommended patient to be discharged. Discharge patient. Discussed with patient importance of monitoring diabetes and importance of following medication and diet. Discussed consequences of poor diabetes control with patient in great detail and possible fatality-patient understood. Referred patient to health  and wellness Center. Referral for Diabetes counseling. Discussed with patient to closely monitor symptoms and if symptoms are to worsen or  change to report back to the ED - strict return instructions given.  Patient agreed to plan of care, understood, all questions answered.   As this provider reviewed the patient's chart further - patient has a long history of poor medical compliance. Patient has been seen in the ED setting numerous times for not taking medication and for elevated glucose levels. Glucose levels have been as high as 646. This provider reviewed patient's chart again and patient has not been on medications - this provider was under the assumption that patient has been taking Lantus, since patient reported that he has been taking his Lantus everyday 30 Units before bedtime. This provider called the patient and spoke with the patient via telephone - patient reported that he has not taken any medication for DM since the end of last year. This provider prescribed patient Lantus 10 Units to be administered before bedtime - patient reported that he will pickup the medications. Patient understood and agreed to plan. Patient verbally agreed to picking up medications. Recommended patient to get re-assessed.   Jamse Mead, PA-C 10/03/13 [phone]

## 2013-10-03 ENCOUNTER — Encounter (HOSPITAL_COMMUNITY): Payer: Self-pay | Admitting: Emergency Medicine

## 2013-10-03 ENCOUNTER — Emergency Department (HOSPITAL_COMMUNITY)
Admission: EM | Admit: 2013-10-03 | Discharge: 2013-10-03 | Disposition: A | Discharge status: Home / Self Care | Payer: Self-pay | Attending: Emergency Medicine | Admitting: Emergency Medicine

## 2013-10-03 DIAGNOSIS — Z91199 Patient's noncompliance with other medical treatment and regimen due to unspecified reason: Secondary | ICD-10-CM | POA: Insufficient documentation

## 2013-10-03 DIAGNOSIS — R739 Hyperglycemia, unspecified: Secondary | ICD-10-CM

## 2013-10-03 DIAGNOSIS — E119 Type 2 diabetes mellitus without complications: Secondary | ICD-10-CM | POA: Insufficient documentation

## 2013-10-03 DIAGNOSIS — Z794 Long term (current) use of insulin: Secondary | ICD-10-CM | POA: Insufficient documentation

## 2013-10-03 DIAGNOSIS — Z9119 Patient's noncompliance with other medical treatment and regimen: Secondary | ICD-10-CM

## 2013-10-03 LAB — CBG MONITORING, ED: GLUCOSE-CAPILLARY: 243 mg/dL — AB (ref 70–99)

## 2013-10-03 MED ORDER — INSULIN GLARGINE 100 UNIT/ML ~~LOC~~ SOLN: 10.0000 [IU] | Freq: Every day | SUBCUTANEOUS | Status: DC

## 2013-10-03 MED ORDER — METFORMIN HCL 1000 MG PO TABS: 1000.0000 mg | ORAL_TABLET | Freq: Two times a day (BID) | ORAL | Status: DC

## 2013-10-03 MED ORDER — GLYBURIDE 5 MG PO TABS: 5.0000 mg | ORAL_TABLET | Freq: Two times a day (BID) | ORAL | Status: DC

## 2013-10-03 NOTE — ED Notes (Signed)
Pt was seen here 3/24 for same. Carlynn Purl, PA-c, saw and evaluated him at that time; called him after he left to let him know she had a prescription for him for Lantus that he needed to pick up and wanted him to be re-evaluated for possible hospital amission.  Reports he has been out of his Lantus since the end of 2014 "I don't like taking medicines."  Reviewed some of the complications that come along with diabetes.  He stated he was aware "I have read a lot about it."

## 2013-10-03 NOTE — ED Provider Notes (Signed)
CSN: [id number]     Arrival date & time 10/03/13  [id number] History   First MD Initiated Contact with Patient 10/03/13 0554     Chief Complaint  Patient presents with  . Hyperglycemia     (Consider location/radiation/quality/duration/timing/severity/associated sxs/prior Treatment) HPI Patient is a 28 yo type II diabetic man who has a very long history of medical non-compliance and is, unfortunately, uninsured. He was seen in the ED yesterday and treated for hyperglycemia. He was discharged with script for Lantus. However, he says the prescription costs > $300 and he can't afford the medication. He is asymptomatic. He came back to the ED to request a less expensive prescription medication.   Patient does not follow an ADA diet. He says, "I feel fine". No polyuria or polydypsia.   Past Medical History  Diagnosis Date  . Diabetes mellitus   . High cholesterol    History reviewed. No pertinent past surgical history. No family history on file. History  Substance Use Topics  . Smoking status: Never Smoker   . Smokeless tobacco: Not on file  . Alcohol Use: Yes    Review of Systems  Ten point review of symptoms performed and is negative with the exception of symptoms noted above.   Allergies  Review of patient's allergies indicates no known allergies.  Home Medications   Current Outpatient Rx  Name  Route  Sig  Dispense  Refill  . insulin glargine (LANTUS) 100 UNIT/ML injection   Subcutaneous   Inject 0.1 mLs (10 Units total) into the skin at bedtime.   10 mL   3    BP 132/70  Pulse 81  Temp(Src) 98.2 F (36.8 C) (Oral)  Resp 18  SpO2 98% Physical Exam Gen: well developed and well nourished appearing Head: NCAT Eyes: PERL, EOMI Nose: no epistaixis or rhinorrhea Mouth/throat: mucosa is moist and pink Neck: supple, no stridor Lungs: CTA B, no wheezing, rhonchi or rales CV: RRR, no murmur, extremities appear well perfused.  Abd: soft, notender, nondistended Back: no  ttp, no cva ttp Skin: warm and dry Ext: normal to inspection, no dependent edema Neuro: CN ii-xii grossly intact, no focal deficits Psyche; normal affect,  calm and cooperative.   ED Course  Procedures (including critical care time) Labs Review Labs Reviewed  CBG MONITORING, ED - Abnormal; Notable for the following:    Glucose-Capillary 243 (*)    All other components within normal limits    MDM   I counseled the patient at length regarding the ultimate consequences of end organ damage secondary to prolonged and persistent hyperglycemia. He really seemed quite indifferent. HE said he intended to continue to drink sodas and juice. Says that diet products give him a headache. Says he does not like to take medications. However, I suggested that we try a combination of metformin and glyburide which will provide an affordable price point. I have referred the patient to the [facility] for outpatient f/u.    Elyn Peers, MD 10/03/13 [phone]

## 2013-10-03 NOTE — ED Notes (Signed)
Pt. requesting blood sugar check seen here yesterday for hyperglycemia , denies SOB / alert and oriented / no pain or discomfort.

## 2013-10-04 NOTE — ED Provider Notes (Signed)
Medical screening examination/treatment/procedure(s) were conducted as a shared visit with non-physician practitioner(s) or resident and myself. I personally evaluated the patient during the encounter and agree with the findings and plan unless otherwise indicated.  I have personally reviewed any xrays and/ or EKG's with the provider and I agree with interpretation.  28 year old male with diabetic type II history presents with mild fatigue, polydipsia, polyuria. Patient has been noncompliant with his Lantus for the past 3-1/2 months. Long discussion with patient regarding noncompliance with diabetes treatment and the long-term risks including renal damage, stroke, heart attack, etc. Patient has a mild anion gap in the ER fluid bolus given. Patient feels improved in the ER and glucose improving. I feel at this time this is more noncompliance and dehydration issue. Stress close followup outpatient and reasons to return. Exam dry mucous membranes, normal heart rate and rhythm, abdomen soft nontender, no rashes visualized, well-appearing, cranial nerves intact.  Diabetes uncontrolled, noncompliant with medications, hyperglycemia, dehydration   Mariea Clonts, MD 10/04/13 1301

## 2013-12-11 ENCOUNTER — Ambulatory Visit: Payer: Self-pay | Admitting: *Deleted

## 2014-01-21 ENCOUNTER — Encounter (HOSPITAL_COMMUNITY): Payer: Self-pay | Admitting: Emergency Medicine

## 2014-01-21 DIAGNOSIS — Y929 Unspecified place or not applicable: Secondary | ICD-10-CM | POA: Insufficient documentation

## 2014-01-21 DIAGNOSIS — E7800 Pure hypercholesterolemia, unspecified: Secondary | ICD-10-CM | POA: Insufficient documentation

## 2014-01-21 DIAGNOSIS — S6290XA Unspecified fracture of unspecified wrist and hand, initial encounter for closed fracture: Secondary | ICD-10-CM | POA: Insufficient documentation

## 2014-01-21 DIAGNOSIS — E119 Type 2 diabetes mellitus without complications: Secondary | ICD-10-CM | POA: Insufficient documentation

## 2014-01-21 DIAGNOSIS — W2209XA Striking against other stationary object, initial encounter: Secondary | ICD-10-CM | POA: Insufficient documentation

## 2014-01-21 DIAGNOSIS — Y939 Activity, unspecified: Secondary | ICD-10-CM | POA: Insufficient documentation

## 2014-01-21 DIAGNOSIS — Z794 Long term (current) use of insulin: Secondary | ICD-10-CM | POA: Insufficient documentation

## 2014-01-21 DIAGNOSIS — Z79899 Other long term (current) drug therapy: Secondary | ICD-10-CM | POA: Insufficient documentation

## 2014-01-21 NOTE — ED Notes (Signed)
Pt states that he hit a wall with his hand and is now having right hand pain

## 2014-01-22 ENCOUNTER — Emergency Department (HOSPITAL_COMMUNITY)
Admission: EM | Admit: 2014-01-22 | Discharge: 2014-01-22 | Disposition: A | Discharge status: Home / Self Care | Payer: Self-pay | Attending: Emergency Medicine | Admitting: Emergency Medicine

## 2014-01-22 ENCOUNTER — Emergency Department (HOSPITAL_COMMUNITY): Payer: Self-pay

## 2014-01-22 DIAGNOSIS — S62318A Displaced fracture of base of other metacarpal bone, initial encounter for closed fracture: Secondary | ICD-10-CM

## 2014-01-22 DIAGNOSIS — S62308A Unspecified fracture of other metacarpal bone, initial encounter for closed fracture: Secondary | ICD-10-CM

## 2014-01-22 MED ORDER — OXYCODONE-ACETAMINOPHEN 5-325 MG PO TABS: 2.0000 | ORAL_TABLET | Freq: Four times a day (QID) | ORAL | Status: DC | PRN

## 2014-01-22 NOTE — ED Provider Notes (Signed)
Medical screening examination/treatment/procedure(s) were performed by non-physician practitioner and as supervising physician I was immediately available for consultation/collaboration.    Johnna Acosta, MD 01/22/14 [phone]

## 2014-01-22 NOTE — ED Provider Notes (Signed)
CSN: [id number]     Arrival date & time 01/21/14  2336 History   First MD Initiated Contact with Patient 01/22/14 0022     Chief Complaint  Patient presents with  . Hand Pain     (Consider location/radiation/quality/duration/timing/severity/associated sxs/prior Treatment) HPI Comments: Patient presents emergency department with chief complaint of hand pain. Patient states that he punched a wall. He is now complaining of right hand pain. Pain is mild. There is obvious swelling to the hand. He has not taken anything to alleviate his symptoms. Pain is aggravated with movement. It does not radiate.  The history is provided by the patient. No language interpreter was used.    Past Medical History  Diagnosis Date  . Diabetes mellitus   . High cholesterol    History reviewed. No pertinent past surgical history. No family history on file. History  Substance Use Topics  . Smoking status: Never Smoker   . Smokeless tobacco: Not on file  . Alcohol Use: Yes    Review of Systems  All other systems reviewed and are negative.     Allergies  Review of patient's allergies indicates no known allergies.  Home Medications   Prior to Admission medications   Medication Sig Start Date End Date Taking? Authorizing Provider  glyBURIDE (DIABETA) 5 MG tablet Take 1 tablet (5 mg total) by mouth 2 (two) times daily with a meal. 10/03/13   Elyn Peers, MD  insulin glargine (LANTUS) 100 UNIT/ML injection Inject 0.1 mLs (10 Units total) into the skin at bedtime. 10/03/13   Marissa Sciacca, PA-C  metFORMIN (GLUCOPHAGE) 1000 MG tablet Take 1 tablet (1,000 mg total) by mouth 2 (two) times daily. 10/03/13   Elyn Peers, MD   BP 145/80  Temp(Src) 98.8 F (37.1 C) (Oral)  Resp 18  Wt 174 lb 7 oz (79.124 kg)  SpO2 100% Physical Exam  Nursing note and vitals reviewed. Constitutional: He is oriented to person, place, and time. He appears well-developed and well-nourished.  HENT:  Head: Normocephalic and  atraumatic.  Eyes: Conjunctivae and EOM are normal. Pupils are equal, round, and reactive to light. Right eye exhibits no discharge. Left eye exhibits no discharge. No scleral icterus.  Neck: Normal range of motion. Neck supple. No JVD present.  Cardiovascular: Normal rate, regular rhythm, normal heart sounds and intact distal pulses.  Exam reveals no gallop and no friction rub.   No murmur heard. Intact distal pulses with brisk capillary refill  Pulmonary/Chest: Effort normal and breath sounds normal. No respiratory distress. He has no wheezes. He has no rales. He exhibits no tenderness.  Abdominal: Soft. He exhibits no distension and no mass. There is no tenderness. There is no rebound and no guarding.  Musculoskeletal: Normal range of motion. He exhibits no edema and no tenderness.  Right hand tender to palpation over the fourth and fifth MCP, and metacarpals, range of motion and strength limited secondary to pain  Neurological: He is alert and oriented to person, place, and time.  Sensation intact  Skin: Skin is warm and dry.  Small scrape on right fifth metacarpal, but this is shallow, no evidence of open fracture  Psychiatric: He has a normal mood and affect. His behavior is normal. Judgment and thought content normal.    ED Course  Procedures (including critical care time) Labs Review Labs Reviewed - No data to display  Imaging Review No results found.   EKG Interpretation None      MDM   Final diagnoses:  Fracture of fourth metacarpal bone, closed, initial encounter  Fracture of fifth metacarpal bone, closed, initial encounter    Patient with fractures of the distal fourth and fifth metacarpals. Will splint the patient in an ulnar gutter length, and recommend hand followup. Neurovascularly intact.  Patient with call Dr. Grandville Silos tomorrow for an appointment. Will discharge with pain medicine. Patient understands and agrees to plan. He is stable and ready for  discharge.    Montine Circle, PA-C 01/22/14 [phone]

## 2014-01-22 NOTE — Discharge Instructions (Signed)
Boxer's Fracture You have a break (fracture) of the fifth metacarpal bone. This is commonly called a boxer's fracture. This is the bone in the hand where the little finger attaches. The fracture is in the end of that bone, closest to the little finger. It is usually caused when you hit an object with a clenched fist. Often, the knuckle is pushed down by the impact. Sometimes, the fracture rotates out of position. A boxer's fracture will usually heal within 6 weeks, if it is treated properly and protected from re-injury. Surgery is sometimes needed. A cast, splint, or bulky hand dressing may be used to protect and immobilize a boxer's fracture. Do not remove this device or dressing until your caregiver approves. Keep your hand elevated, and apply ice packs for 15-20 minutes every 2 hours, for the first 2 days. Elevation and ice help reduce swelling and relieve pain. See your caregiver, or an orthopedic specialist, for follow-up care within the next 10 days. This is to make sure your fracture is healing properly. Document Released: 06/28/2005 Document Revised: 09/20/2011 Document Reviewed: 12/16/2006 [facility] Patient Information 2015 [place], [place]. This information is not intended to replace advice given to you by your health care provider. Make sure you discuss any questions you have with your health care provider.

## 2014-07-16 ENCOUNTER — Encounter (HOSPITAL_COMMUNITY)
Admission: EM | Disposition: A | Discharge status: Home / Self Care | Payer: Self-pay | Source: Home / Self Care | Attending: Emergency Medicine

## 2014-07-16 ENCOUNTER — Emergency Department (HOSPITAL_COMMUNITY): Payer: Self-pay

## 2014-07-16 ENCOUNTER — Emergency Department (HOSPITAL_COMMUNITY): Payer: Self-pay | Admitting: Certified Registered"

## 2014-07-16 ENCOUNTER — Ambulatory Visit (HOSPITAL_COMMUNITY)
Admission: EM | Admit: 2014-07-16 | Discharge: 2014-07-16 | Disposition: A | Discharge status: Home / Self Care | Payer: Self-pay | Attending: Emergency Medicine | Admitting: Emergency Medicine

## 2014-07-16 ENCOUNTER — Encounter (HOSPITAL_COMMUNITY): Payer: Self-pay

## 2014-07-16 DIAGNOSIS — E780 Pure hypercholesterolemia: Secondary | ICD-10-CM | POA: Insufficient documentation

## 2014-07-16 DIAGNOSIS — L02512 Cutaneous abscess of left hand: Secondary | ICD-10-CM

## 2014-07-16 DIAGNOSIS — Z794 Long term (current) use of insulin: Secondary | ICD-10-CM | POA: Insufficient documentation

## 2014-07-16 DIAGNOSIS — L03012 Cellulitis of left finger: Secondary | ICD-10-CM | POA: Insufficient documentation

## 2014-07-16 DIAGNOSIS — E119 Type 2 diabetes mellitus without complications: Secondary | ICD-10-CM | POA: Insufficient documentation

## 2014-07-16 HISTORY — PX: I & D EXTREMITY: SHX5045

## 2014-07-16 LAB — CBC WITH DIFFERENTIAL/PLATELET
BASOS PCT: 0 % (ref 0–1)
Basophils Absolute: 0 10*3/uL (ref 0.0–0.1)
EOS ABS: 0 10*3/uL (ref 0.0–0.7)
EOS PCT: 0 % (ref 0–5)
HCT: 43.5 % (ref 39.0–52.0)
HEMOGLOBIN: 14.5 g/dL (ref 13.0–17.0)
Lymphocytes Relative: 26 % (ref 12–46)
Lymphs Abs: 1.7 10*3/uL (ref 0.7–4.0)
MCH: 26.1 pg (ref 26.0–34.0)
MCHC: 33.3 g/dL (ref 30.0–36.0)
MCV: 78.2 fL (ref 78.0–100.0)
Monocytes Absolute: 0.5 10*3/uL (ref 0.1–1.0)
Monocytes Relative: 7 % (ref 3–12)
Neutro Abs: 4.4 10*3/uL (ref 1.7–7.7)
Neutrophils Relative %: 67 % (ref 43–77)
Platelets: 315 10*3/uL (ref 150–400)
RBC: 5.56 MIL/uL (ref 4.22–5.81)
RDW: 13 % (ref 11.5–15.5)
WBC: 6.6 10*3/uL (ref 4.0–10.5)

## 2014-07-16 LAB — URINALYSIS, ROUTINE W REFLEX MICROSCOPIC
Bilirubin Urine: NEGATIVE
HGB URINE DIPSTICK: NEGATIVE
KETONES UR: NEGATIVE mg/dL
LEUKOCYTES UA: NEGATIVE
NITRITE: NEGATIVE
PH: 6.5 (ref 5.0–8.0)
Protein, ur: NEGATIVE mg/dL
Specific Gravity, Urine: 1.01 (ref 1.005–1.030)
UROBILINOGEN UA: 0.2 mg/dL (ref 0.0–1.0)

## 2014-07-16 LAB — COMPREHENSIVE METABOLIC PANEL
ALK PHOS: 102 U/L (ref 39–117)
ALT: 18 U/L (ref 0–53)
ANION GAP: 8 (ref 5–15)
AST: 16 U/L (ref 0–37)
Albumin: 3.7 g/dL (ref 3.5–5.2)
BILIRUBIN TOTAL: 0.6 mg/dL (ref 0.3–1.2)
BUN: 12 mg/dL (ref 6–23)
CHLORIDE: 99 meq/L (ref 96–112)
CO2: 28 mmol/L (ref 19–32)
CREATININE: 0.62 mg/dL (ref 0.50–1.35)
Calcium: 9.1 mg/dL (ref 8.4–10.5)
GFR calc Af Amer: >90 mL/min (ref >90)
GFR calc non Af Amer: >90 mL/min (ref >90)
Glucose, Bld: 442 mg/dL — ABNORMAL HIGH (ref 70–99)
Potassium: 4.3 mmol/L (ref 3.5–5.1)
Sodium: 135 mmol/L (ref 135–145)
Total Protein: 7.1 g/dL (ref 6.0–8.3)

## 2014-07-16 LAB — CBG MONITORING, ED
GLUCOSE-CAPILLARY: 283 mg/dL — AB (ref 70–99)
Glucose-Capillary: 384 mg/dL — ABNORMAL HIGH (ref 70–99)
Glucose-Capillary: 275 mg/dL — ABNORMAL HIGH (ref 70–99)

## 2014-07-16 LAB — URINE MICROSCOPIC-ADD ON

## 2014-07-16 LAB — GLUCOSE, CAPILLARY: Glucose-Capillary: 285 mg/dL — ABNORMAL HIGH (ref 70–99)

## 2014-07-16 SURGERY — IRRIGATION AND DEBRIDEMENT EXTREMITY
Anesthesia: Monitor Anesthesia Care | Site: Hand | Laterality: Left

## 2014-07-16 MED ORDER — LIDOCAINE HCL (CARDIAC) 20 MG/ML IV SOLN
INTRAVENOUS | Status: DC | PRN
  Administered 2014-07-16: 100 mg via INTRAVENOUS

## 2014-07-16 MED ORDER — BUPIVACAINE HCL (PF) 0.25 % IJ SOLN
INTRAMUSCULAR | Status: DC | PRN
  Administered 2014-07-16: 5 mL

## 2014-07-16 MED ORDER — 0.9 % SODIUM CHLORIDE (POUR BTL) OPTIME
TOPICAL | Status: DC | PRN
  Administered 2014-07-16: 1000 mL

## 2014-07-16 MED ORDER — LIDOCAINE HCL (PF) 1 % IJ SOLN
INTRAMUSCULAR | Status: AC
  Filled 2014-07-16: qty 30

## 2014-07-16 MED ORDER — BUPIVACAINE HCL (PF) 0.25 % IJ SOLN
INTRAMUSCULAR | Status: AC
  Filled 2014-07-16: qty 30

## 2014-07-16 MED ORDER — SODIUM CHLORIDE 0.9 % IV BOLUS (SEPSIS)
500.0000 mL | Freq: Once | INTRAVENOUS | Status: AC
  Administered 2014-07-16: 500 mL via INTRAVENOUS

## 2014-07-16 MED ORDER — ONDANSETRON HCL 4 MG/2ML IJ SOLN
INTRAMUSCULAR | Status: DC | PRN
  Administered 2014-07-16: 4 mg via INTRAVENOUS

## 2014-07-16 MED ORDER — SUFENTANIL CITRATE 50 MCG/ML IV SOLN
INTRAVENOUS | Status: AC
  Filled 2014-07-16: qty 1

## 2014-07-16 MED ORDER — LACTATED RINGERS IV SOLN
INTRAVENOUS | Status: DC | PRN
  Administered 2014-07-16: 19:00:00 via INTRAVENOUS

## 2014-07-16 MED ORDER — PROPOFOL 10 MG/ML IV BOLUS
INTRAVENOUS | Status: AC
  Filled 2014-07-16: qty 20

## 2014-07-16 MED ORDER — MIDAZOLAM HCL 2 MG/2ML IJ SOLN
INTRAMUSCULAR | Status: DC | PRN
  Administered 2014-07-16: 2 mg via INTRAVENOUS

## 2014-07-16 MED ORDER — LIDOCAINE HCL (CARDIAC) 20 MG/ML IV SOLN
INTRAVENOUS | Status: AC
  Filled 2014-07-16: qty 5

## 2014-07-16 MED ORDER — CEFAZOLIN SODIUM-DEXTROSE 2-3 GM-% IV SOLR
INTRAVENOUS | Status: DC | PRN
  Administered 2014-07-16: 2 g via INTRAVENOUS

## 2014-07-16 MED ORDER — MIDAZOLAM HCL 2 MG/2ML IJ SOLN
INTRAMUSCULAR | Status: AC
  Filled 2014-07-16: qty 2

## 2014-07-16 MED ORDER — ONDANSETRON HCL 4 MG/2ML IJ SOLN
INTRAMUSCULAR | Status: AC
  Filled 2014-07-16: qty 2

## 2014-07-16 MED ORDER — LIDOCAINE HCL (PF) 1 % IJ SOLN
INTRAMUSCULAR | Status: DC | PRN
  Administered 2014-07-16: 5 mL

## 2014-07-16 MED ORDER — PROPOFOL 10 MG/ML IV BOLUS
INTRAVENOUS | Status: DC | PRN
  Administered 2014-07-16: 50 mg via INTRAVENOUS

## 2014-07-16 MED ORDER — CEFAZOLIN SODIUM-DEXTROSE 2-3 GM-% IV SOLR
INTRAVENOUS | Status: AC
  Filled 2014-07-16: qty 50

## 2014-07-16 MED ORDER — SULFAMETHOXAZOLE-TRIMETHOPRIM 800-160 MG PO TABS: 1.0000 | ORAL_TABLET | Freq: Two times a day (BID) | ORAL | Status: DC

## 2014-07-16 MED ORDER — OXYCODONE-ACETAMINOPHEN 5-325 MG PO TABS: ORAL_TABLET | ORAL | Status: DC

## 2014-07-16 SURGICAL SUPPLY — 53 items
BANDAGE COBAN STERILE 2 (GAUZE/BANDAGES/DRESSINGS) IMPLANT
BANDAGE ELASTIC 3 VELCRO ST LF (GAUZE/BANDAGES/DRESSINGS) ×1 IMPLANT
BANDAGE ELASTIC 4 VELCRO ST LF (GAUZE/BANDAGES/DRESSINGS) ×1 IMPLANT
BNDG CMPR 9X4 STRL LF SNTH (GAUZE/BANDAGES/DRESSINGS) ×1
BNDG COHESIVE 1X5 TAN STRL LF (GAUZE/BANDAGES/DRESSINGS) ×2 IMPLANT
BNDG CONFORM 2 STRL LF (GAUZE/BANDAGES/DRESSINGS) ×2 IMPLANT
BNDG ESMARK 4X9 LF (GAUZE/BANDAGES/DRESSINGS) ×2 IMPLANT
BNDG GAUZE ELAST 4 BULKY (GAUZE/BANDAGES/DRESSINGS) ×1 IMPLANT
CORDS BIPOLAR (ELECTRODE) ×3 IMPLANT
COVER SURGICAL LIGHT HANDLE (MISCELLANEOUS) ×3 IMPLANT
DECANTER SPIKE VIAL GLASS SM (MISCELLANEOUS) ×3 IMPLANT
DRAIN PENROSE 1/4X12 LTX STRL (WOUND CARE) IMPLANT
DRSG ADAPTIC 3X8 NADH LF (GAUZE/BANDAGES/DRESSINGS) IMPLANT
DRSG EMULSION OIL 3X3 NADH (GAUZE/BANDAGES/DRESSINGS) ×1 IMPLANT
DRSG PAD ABDOMINAL 8X10 ST (GAUZE/BANDAGES/DRESSINGS) ×2 IMPLANT
GAUZE PACKING IODOFORM 1/4X15 (GAUZE/BANDAGES/DRESSINGS) ×4 IMPLANT
GAUZE SPONGE 4X4 12PLY STRL (GAUZE/BANDAGES/DRESSINGS) ×3 IMPLANT
GAUZE XEROFORM 1X8 LF (GAUZE/BANDAGES/DRESSINGS) ×3 IMPLANT
GLOVE BIO SURGEON STRL SZ7.5 (GLOVE) ×3 IMPLANT
GLOVE BIOGEL PI IND STRL 8 (GLOVE) ×1 IMPLANT
GLOVE BIOGEL PI INDICATOR 8 (GLOVE) ×2
GOWN STRL REIN XL XLG (GOWN DISPOSABLE) ×3 IMPLANT
HANDPIECE INTERPULSE COAX TIP (DISPOSABLE)
KIT BASIN OR (CUSTOM PROCEDURE TRAY) ×3 IMPLANT
KIT ROOM TURNOVER OR (KITS) ×3 IMPLANT
LOOP VESSEL MAXI BLUE (MISCELLANEOUS) IMPLANT
LOOP VESSEL MINI RED (MISCELLANEOUS) IMPLANT
MANIFOLD NEPTUNE II (INSTRUMENTS) ×3 IMPLANT
NDL HYPO 25X1 1.5 SAFETY (NEEDLE) IMPLANT
NEEDLE HYPO 25X1 1.5 SAFETY (NEEDLE) ×3 IMPLANT
NS IRRIG 1000ML POUR BTL (IV SOLUTION) ×3 IMPLANT
PACK ORTHO EXTREMITY (CUSTOM PROCEDURE TRAY) ×3 IMPLANT
PAD ARMBOARD 7.5X6 YLW CONV (MISCELLANEOUS) ×6 IMPLANT
SCRUB BETADINE 4OZ XXX (MISCELLANEOUS) ×1 IMPLANT
SET HNDPC FAN SPRY TIP SCT (DISPOSABLE) IMPLANT
SOLUTION BETADINE 4OZ (MISCELLANEOUS) ×1 IMPLANT
SPLINT FINGER (SOFTGOODS) ×2 IMPLANT
SPONGE GAUZE 4X4 12PLY STER LF (GAUZE/BANDAGES/DRESSINGS) ×2 IMPLANT
SPONGE LAP 18X18 X RAY DECT (DISPOSABLE) ×3 IMPLANT
SPONGE LAP 4X18 X RAY DECT (DISPOSABLE) ×1 IMPLANT
SUCTION FRAZIER TIP 10 FR DISP (SUCTIONS) ×3 IMPLANT
SUT ETHILON 4 0 PS 2 18 (SUTURE) ×3 IMPLANT
SUT MON AB 5-0 P3 18 (SUTURE) IMPLANT
SYR CONTROL 10ML LL (SYRINGE) IMPLANT
TOWEL OR 17X24 6PK STRL BLUE (TOWEL DISPOSABLE) ×3 IMPLANT
TOWEL OR 17X26 10 PK STRL BLUE (TOWEL DISPOSABLE) ×3 IMPLANT
TUBE ANAEROBIC SPECIMEN COL (MISCELLANEOUS) ×2 IMPLANT
TUBE CONNECTING 12'X1/4 (SUCTIONS) ×1
TUBE CONNECTING 12X1/4 (SUCTIONS) ×2 IMPLANT
TUBE FEEDING 5FR 15 INCH (TUBING) IMPLANT
UNDERPAD 30X30 INCONTINENT (UNDERPADS AND DIAPERS) ×3 IMPLANT
WATER STERILE IRR 1000ML POUR (IV SOLUTION) ×1 IMPLANT
YANKAUER SUCT BULB TIP NO VENT (SUCTIONS) ×3 IMPLANT

## 2014-07-16 NOTE — Transfer of Care (Signed)
Immediate Anesthesia Transfer of Care Note  Patient: [name]  Procedure(s) Performed: Procedure(s): IRRIGATION AND DEBRIDEMENT EXTREMITY/LEFT INDEX FINGER (Left)  Patient Location: PACU  Anesthesia Type:MAC and Left index digital block  Level of Consciousness: awake, alert , oriented and patient cooperative  Airway & Oxygen Therapy: Patient Spontanous Breathing  Post-op Assessment: Report given to PACU RN, Post -op Vital signs reviewed and stable and Patient moving all extremities X 4  Post vital signs: Reviewed and stable  Complications: No apparent anesthesia complications
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
## 2014-07-16 NOTE — Op Note (Signed)
[id number] 
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
## 2014-07-16 NOTE — Anesthesia Preprocedure Evaluation (Addendum)
Anesthesia Evaluation  Patient identified by MRN, date of birth, ID band Patient awake    Reviewed: Allergy & Precautions, NPO status Preop documentation limited or incomplete due to emergent nature of procedure.  History of Anesthesia Complications Negative for: history of anesthetic complications  Airway Mallampati: II  TM Distance: >3 FB Neck ROM: Full    Dental  (+) Poor Dentition, Dental Advisory Given   Pulmonary neg pulmonary ROS,    Pulmonary exam normal       Cardiovascular negative cardio ROS      Neuro/Psych negative neurological ROS  negative psych ROS   GI/Hepatic negative GI ROS, Neg liver ROS,   Endo/Other  diabetes, Insulin Dependent  Renal/GU negative Renal ROS     Musculoskeletal   Abdominal   Peds  Hematology   Anesthesia Other Findings   Reproductive/Obstetrics                            Anesthesia Physical Anesthesia Plan  ASA: II and emergent  Anesthesia Plan: MAC   Post-op Pain Management:    Induction: Intravenous  Airway Management Planned: Simple Face Mask  Additional Equipment:   Intra-op Plan:   Post-operative Plan:   Informed Consent: I have reviewed the patients History and Physical, chart, labs and discussed the procedure including the risks, benefits and alternatives for the proposed anesthesia with the patient or authorized representative who has indicated his/her understanding and acceptance.   Dental advisory given  Plan Discussed with: CRNA, Anesthesiologist and Surgeon  Anesthesia Plan Comments:         Anesthesia Quick Evaluation

## 2014-07-16 NOTE — Discharge Instructions (Signed)

## 2014-07-16 NOTE — Anesthesia Postprocedure Evaluation (Signed)
Anesthesia Post Note  Patient: [name]  Procedure(s) Performed: Procedure(s) (LRB): IRRIGATION AND DEBRIDEMENT EXTREMITY/LEFT INDEX FINGER (Left)  Anesthesia type: MAC  Patient location: PACU  Post pain: Pain level controlled  Post assessment: Patient's Cardiovascular Status Stable  Last Vitals:  Filed Vitals:   07/16/14 2021  BP: 131/82  Pulse: 74  Temp: 36.7 C  Resp: 16    Post vital signs: Reviewed and stable  Level of consciousness: sedated  Complications: No apparent anesthesia complications

## 2014-07-16 NOTE — Brief Op Note (Signed)
07/16/2014  8:12 PM  PATIENT:  Carlus Pavlov Munn  29 y.o. male  PRE-OPERATIVE DIAGNOSIS:  Infected Left Index Finger  POST-OPERATIVE DIAGNOSIS: Left Index finger felon and paronychia  PROCEDURE:  Procedure(s): IRRIGATION AND DEBRIDEMENT EXTREMITY/LEFT INDEX FINGER (Left)  SURGEON:  Surgeon(s) and Role:    * Leanora Cover, MD - Primary  PHYSICIAN ASSISTANT:   ASSISTANTS: none   ANESTHESIA:   local and MAC  EBL:     BLOOD ADMINISTERED:none  DRAINS: iodoform packing  LOCAL MEDICATIONS USED:  MARCAINE    and LIDOCAINE   SPECIMEN:  Source of Specimen:  left index finger  DISPOSITION OF SPECIMEN:  micro  COUNTS:  YES  TOURNIQUET:   Total Tourniquet Time Documented: Upper Arm (Left) - 15 minutes Total: Upper Arm (Left) - 15 minutes   DICTATION: .Other Dictation: Dictation Number [id number]  PLAN OF CARE: Discharge to home after PACU  PATIENT DISPOSITION:  PACU - hemodynamically stable.

## 2014-07-16 NOTE — ED Notes (Signed)
Pt's left index finger has been painful and swollen since this past weekend. Squeezed some pus out of it and the finger is swollen. Working outside hasn't made it feel any better.

## 2014-07-17 NOTE — Op Note (Signed)
[name], [name]               ACCOUNT NO.:  [id number]  MEDICAL RECORD NO.:  [id number]  LOCATION:  [facility]                         FACILITY:  [facility]  PHYSICIAN:  Leanora Cover, MD        DATE OF BIRTH:  [birth year]  DATE OF PROCEDURE:  07/16/2014 DATE OF DISCHARGE:  07/16/2014                              OPERATIVE REPORT   PREOPERATIVE DIAGNOSIS:  Left index finger felon and paronychia.  POSTOPERATIVE DIAGNOSIS:  Left index finger felon and paronychia.  PROCEDURE:  Left index finger incision and drainage of felon and paronychia with removal of nail plate.  SURGEON:  Leanora Cover, MD  ASSISTANT:  None.  ANESTHESIA:  MAC with local.  IV FLUIDS:  Per anesthesia flow sheet.  ESTIMATED BLOOD LOSS:  Minimal.  COMPLICATIONS:  None.  SPECIMENS:  Left index finger cultures to Micro.  TOURNIQUET TIME:  15 minutes.  DISPOSITION:  Stable to PACU.  INDICATIONS:  [name] is a 29 year old right-hand dominant male who states he has had issues with left index finger for approximately 1 week.  It has been swollen and painful.  His sister poked it with a needle approximately 5 days ago and purulence drained, the swelling decreased, but has since recurred.  No fevers, chills or night sweats. He presented to the [facility] Emergency Department, was evaluated and I was consulted for management of the injury.  I recommended incision and drainage in the operating room.  Risks, benefits and alternatives of the surgery were discussed including the risk of blood loss; infection; damage to nerves, vessels, tendons, ligaments, bone; failure of surgery; need for additional surgery; complications with wound healing; continued pain; continued infection; need for repeat irrigation and debridement and nail deformity.  He voiced understanding of these risks and elected to proceed.  OPERATIVE COURSE:  After being identified preoperatively by myself, the patient and I agreed upon the procedure and  site of procedure.  Surgical site was marked.  The risks, benefits, and alternatives of the surgery were reviewed and he wished to proceed.  Surgical consent had been signed.  He was transferred to the operating room and placed on the operating room table in supine position with left upper extremity on an armboard.  MAC anesthesia was induced by anesthesiologist.  Digital block was performed in the left index finger with 10 mL of half and half solution of 1% lidocaine and 0.25% plain Marcaine.  A surgical pause had been performed by the surgeons, anesthesia and operating room staff, and all were in agreement as to the patient, procedure and site or procedure.  The left upper extremity was prepped and draped in normal sterile orthopedic fashion.  A surgical pause was again performed between the surgeons, anesthesia, [place] room staff, and all were in agreement as to the patient, procedure, and site of procedure. Tourniquet at the proximal aspect of the extremity was inflated to 250 mmHg after exsanguination of the limb with an Esmarch bandage.  The area was palpated.  There was gross purulence from under the nailfold.  An incision was made at the ulnar side of the distal phalanx and carried into the subcutaneous tissues by spreading technique.  [name]  purulence was encountered.  Cultures were taken for aerobes, anaerobes, and Gram stain.  The septi of the pad of the finger were spread with a hemostat.  The nail was removed with a Soil scientist.  It already been elevated from the infection on the ulnar side.  A tract from the ulnar side of the undersurface of the nail to the pad of the finger was found. An incision was made underneath the nailfold and no purulence was encountered in this area.  The wounds were copiously irrigated with sterile saline, then packed with 0.25-inch iodoform gauze.  A Xeroform was placed in nail fold and the piece of Xeroform placed in the incision underneath the  nailfold as well.  The wounds were all dressed with sterile Xeroform, 4x4s, and wrapped with a Coban, Kling dressing lightly.  An AlumaFoam splint was placed and wrapped with the Coban dressing lightly.  Tourniquet was deflated at 15 minutes.  Fingertips were pink with brisk capillary refill after deflation of the tourniquet. The operative drapes were broken down and the patient was awoken from anesthesia safely.  He was transferred back to the stretcher and taken to PACU in stable condition.  I will see him back in the office in 3 days for postoperative followup.  I will give him Percocet 5/325, 1-2 p.o. q.6 hours p.r.n. pain, dispensed #30 and Bactrim DS 1 p.o. b.i.d. x7 days.     Leanora Cover, MD     KK/MEDQ  D:  07/16/2014  T:  07/17/2014  Job:  [id number]

## 2014-07-18 ENCOUNTER — Encounter (HOSPITAL_COMMUNITY): Payer: Self-pay | Admitting: Orthopedic Surgery

## 2014-07-19 LAB — CULTURE, ROUTINE-ABSCESS

## 2014-07-22 LAB — ANAEROBIC CULTURE

## 2015-11-18 ENCOUNTER — Ambulatory Visit (INDEPENDENT_AMBULATORY_CARE_PROVIDER_SITE_OTHER): Payer: Self-pay

## 2015-11-18 ENCOUNTER — Ambulatory Visit
Admission: EM | Admit: 2015-11-18 | Discharge: 2015-11-18 | Disposition: A | Discharge status: Home / Self Care | Payer: Self-pay | Attending: Family Medicine | Admitting: Family Medicine

## 2015-11-18 ENCOUNTER — Encounter: Payer: Self-pay | Admitting: *Deleted

## 2015-11-18 DIAGNOSIS — H0011 Chalazion right upper eyelid: Secondary | ICD-10-CM

## 2015-11-18 DIAGNOSIS — S39012A Strain of muscle, fascia and tendon of lower back, initial encounter: Secondary | ICD-10-CM

## 2015-11-18 MED ORDER — NAPROXEN 500 MG PO TABS: 500.0000 mg | ORAL_TABLET | Freq: Two times a day (BID) | ORAL | Status: DC

## 2015-11-18 MED ORDER — METAXALONE 800 MG PO TABS: 800.0000 mg | ORAL_TABLET | Freq: Three times a day (TID) | ORAL | Status: DC

## 2015-11-18 NOTE — Discharge Instructions (Signed)
Back Injury Prevention °Back injuries can be very painful. They can also be difficult to heal. After having one back injury, you are more likely to injure your back again. It is important to learn how to avoid injuring or re-injuring your back. The following tips can help you to prevent a back injury. °WHAT SHOULD I KNOW ABOUT PHYSICAL FITNESS? °· Exercise for 30 minutes per day on most days of the week or as directed by your health care provider. Make sure to: °· Do aerobic exercises, such as walking, jogging, biking, or swimming. °· Do exercises that increase balance and strength, such as tai chi and yoga. These can decrease your risk of falling and injuring your back. °· Do stretching exercises to help with flexibility. °· Try to develop strong abdominal muscles. Your abdominal muscles provide a lot of the support that is needed by your back. °· Maintain a healthy weight.  This helps to decrease your risk of a back injury. °WHAT SHOULD I KNOW ABOUT MY DIET? °· Talk with your health care provider about your overall diet. Take supplements and vitamins only as directed by your health care provider. °· Talk with your health care provider about how much calcium and vitamin D you need each day. These nutrients help to prevent weakening of the bones (osteoporosis). Osteoporosis can cause broken (fractured) bones, which lead to back pain. °· Include good sources of calcium in your diet, such as dairy products, green leafy vegetables, and products that have had calcium added to them (fortified). °· Include good sources of vitamin D in your diet, such as milk and foods that are fortified with vitamin D. °WHAT SHOULD I KNOW ABOUT MY POSTURE? °· Sit up straight and stand up straight. Avoid leaning forward when you sit or hunching over when you stand. °· Choose chairs that have good low-back (lumbar) support. °· If you work at a desk, sit close to it so you do not need to lean over. Keep your chin tucked in. Keep your neck  drawn back, and keep your elbows bent at a right angle. Your arms should look like the letter "L." °· Sit high and close to the steering wheel when you drive. Add a lumbar support to your car seat, if needed. °· Avoid sitting or standing in one position for very long. Take breaks to get up, stretch, and walk around at least one time every hour. Take breaks every hour if you are driving for long periods of time. °· Sleep on your side with your knees slightly bent, or sleep on your back with a pillow under your knees. Do not lie on the front of your body to sleep. °WHAT SHOULD I KNOW ABOUT LIFTING, TWISTING, AND REACHING? °Lifting and Heavy Lifting °· Avoid heavy lifting, especially repetitive heavy lifting. If you must do heavy lifting: °· Stretch before lifting. °· Work slowly. °· Rest between lifts. °· Use a tool such as a cart or a dolly to move objects if one is available. °· Make several small trips instead of carrying one heavy load. °· Ask for help when you need it, especially when moving big objects. °· Follow these steps when lifting: °· Stand with your feet shoulder-width apart. °· Get as close to the object as you can. Do not try to pick up a heavy object that is far from your body. °· Use handles or lifting straps if they are available. °· Bend at your knees. Squat down, but keep your heels off the floor. °·   Keep your shoulders pulled back, your chin tucked in, and your back straight.  Lift the object slowly while you tighten the muscles in your legs, abdomen, and buttocks. Keep the object as close to the center of your body as possible.  Follow these steps when putting down a heavy load:  Stand with your feet shoulder-width apart.  Lower the object slowly while you tighten the muscles in your legs, abdomen, and buttocks. Keep the object as close to the center of your body as possible.  Keep your shoulders pulled back, your chin tucked in, and your back straight.  Bend at your knees. Squat  down, but keep your heels off the floor.  Use handles or lifting straps if they are available. Twisting and Reaching  Avoid lifting heavy objects above your waist.  Do not twist at your waist while you are lifting or carrying a load. If you need to turn, move your feet.  Do not bend over without bending at your knees.  Avoid reaching over your head, across a table, or for an object on a high surface. WHAT ARE SOME OTHER TIPS?  Avoid wet floors and icy ground. Keep sidewalks clear of ice to prevent falls.  Do not sleep on a mattress that is too soft or too hard.  Keep items that are used frequently within easy reach.  Put heavier objects on shelves at waist level, and put lighter objects on lower or higher shelves.  Find ways to decrease your stress, such as exercise, massage, or relaxation techniques. Stress can build up in your muscles. Tense muscles are more vulnerable to injury.  Talk with your health care provider if you feel anxious or depressed. These conditions can make back pain worse.  Wear flat heel shoes with cushioned soles.  Avoid sudden movements.  Use both shoulder straps when carrying a backpack.  Do not use any tobacco products, including cigarettes, chewing tobacco, or electronic cigarettes. If you need help quitting, ask your health care provider.   This information is not intended to replace advice given to you by your health care provider. Make sure you discuss any questions you have with your health care provider.   Document Released: 08/05/2004 Document Revised: 11/12/2014 Document Reviewed: 07/02/2014 Elsevier Interactive Patient Education 2016 [place] A chalazion is a swelling or lump on the eyelid. It can affect the upper or lower eyelid. CAUSES This condition may be caused by:  Long-lasting (chronic) inflammation of the eyelid glands.  A blocked oil gland in the eyelid. SYMPTOMS Symptoms of this condition include:  A  swelling on the eyelid. The swelling may spread to areas around the eye.  A hard lump on the eyelid. This lump may make it hard to see out of the eye. DIAGNOSIS This condition is diagnosed with an examination of the eye. TREATMENT This condition is treated by applying a warm compress to the eyelid. If the condition does not improve after two days, it may be treated with:  Surgery.  Medicine that is injected into the chalazion by a health care provider.  Medicine that is applied to the eye. HOME CARE INSTRUCTIONS  Do not touch the chalazion.  Do not try to remove the pus, such as by squeezing the chalazion or sticking it with a pin or needle.  Do not rub your eyes.  Wash your hands often. Dry your hands with a clean towel.  Keep your face, scalp, and eyebrows clean.  Avoid wearing eye makeup.  Apply a warm, moist compress to the eyelid 4-6 times a day for 10-15 minutes at a time. This will help to open any blocked glands and help to reduce redness and swelling.  Apply over-the-counter and prescription medicines only as told by your health care provider.  If the chalazion does not break open (rupture) on its own in a month, return to your health care provider.  Keep all follow-up appointments as told by your health care provider. This is important. SEEK MEDICAL CARE IF:  Your eyelid has not improved in 4 weeks.  Your eyelid is getting worse.  You have a fever.  The chalazion does not rupture on its own with home treatment in a month. SEEK IMMEDIATE MEDICAL CARE IF:  You have pain in your eye.  Your vision changes.  The chalazion becomes painful or red  The chalazion gets bigger.   This information is not intended to replace advice given to you by your health care provider. Make sure you discuss any questions you have with your health care provider.   Document Released: 06/25/2000 Document Revised: 03/19/2015 Document Reviewed: 10/21/2014 Elsevier Interactive  Patient Education 2016 Elsevier Inc[employer]  Lumbosacral Strain Lumbosacral strain is a strain of any of the parts that make up your lumbosacral vertebrae. Your lumbosacral vertebrae are the bones that make up the lower third of your backbone. Your lumbosacral vertebrae are held together by muscles and tough, fibrous tissue (ligaments).  CAUSES  A sudden blow to your back can cause lumbosacral strain. Also, anything that causes an excessive stretch of the muscles in the low back can cause this strain. This is typically seen when people exert themselves strenuously, fall, lift heavy objects, bend, or crouch repeatedly. RISK FACTORS  Physically demanding work.  Participation in pushing or pulling sports or sports that require a sudden twist of the back (tennis, golf, baseball).  Weight lifting.  Excessive lower back curvature.  Forward-tilted pelvis.  Weak back or abdominal muscles or both.  Tight hamstrings. SIGNS AND SYMPTOMS  Lumbosacral strain may cause pain in the area of your injury or pain that moves (radiates) down your leg.  DIAGNOSIS Your health care provider can often diagnose lumbosacral strain through a physical exam. In some cases, you may need tests such as X-ray exams.  TREATMENT  Treatment for your lower back injury depends on many factors that your clinician will have to evaluate. However, most treatment will include the use of anti-inflammatory medicines. HOME CARE INSTRUCTIONS   Avoid hard physical activities (tennis, racquetball, waterskiing) if you are not in proper physical condition for it. This may aggravate or create problems.  If you have a back problem, avoid sports requiring sudden body movements. Swimming and walking are generally safer activities.  Maintain good posture.  Maintain a healthy weight.  For acute conditions, you may put ice on the injured area.  Put ice in a plastic bag.  Place a towel between your skin and the bag.  Leave the ice on for  20 minutes, 2-3 times a day.  When the low back starts healing, stretching and strengthening exercises may be recommended. SEEK MEDICAL CARE IF:  Your back pain is getting worse.  You experience severe back pain not relieved with medicines. SEEK IMMEDIATE MEDICAL CARE IF:   You have numbness, tingling, weakness, or problems with the use of your arms or legs.  There is a change in bowel or bladder control.  You have increasing pain in any area of the body, including  your belly (abdomen).  You notice shortness of breath, dizziness, or feel faint.  You feel sick to your stomach (nauseous), are throwing up (vomiting), or become sweaty.  You notice discoloration of your toes or legs, or your feet get very cold. MAKE SURE YOU:   Understand these instructions.  Will watch your condition.  Will get help right away if you are not doing well or get worse.   This information is not intended to replace advice given to you by your health care provider. Make sure you discuss any questions you have with your health care provider.   Document Released: 04/07/2005 Document Revised: 07/19/2014 Document Reviewed: 02/14/2013 Elsevier Interactive Patient Education [employer].

## 2015-11-18 NOTE — ED Notes (Signed)
Pt fell last Sunday, landed on back. C/o low back pain. Also "bump" in right eyelid x3 months.

## 2015-11-18 NOTE — ED Provider Notes (Signed)
CSN: [id number]     Arrival date & time 11/18/15  1821 History   First MD Initiated Contact with Patient 11/18/15 1854     Chief Complaint  Patient presents with  . Back Pain  . Eye Problem   (Consider location/radiation/quality/duration/timing/severity/associated sxs/prior Treatment) HPI   This a 30 year old male who presents with low back pain. 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 At that time the pain has just worsened. Nonradiating. Worse whenever he extends his back. He has no incontinence.  Second problem is a bump that he has on his upper eyelid. It does not affect his vision and vision was recorded as normal today.     Past Medical History  Diagnosis Date  . Diabetes mellitus   . High cholesterol    Past Surgical History  Procedure Laterality Date  . I&d extremity Left 07/16/2014    Procedure: IRRIGATION AND DEBRIDEMENT EXTREMITY/LEFT INDEX FINGER;  Surgeon: James Cover, MD;  Location: [facility];  Service: Orthopedics;  Laterality: Left;  . Fracture surgery     Family History  Problem Relation Age of Onset  . Cancer Mother   . Stroke Father   . Diabetes Father   . Hypertension Father    Social History  Substance Use Topics  . Smoking status: Never Smoker   . Smokeless tobacco: None  . Alcohol Use: Yes    Review of Systems  Constitutional: Positive for activity change. Negative for fever, chills and fatigue.  Eyes: Positive for pain.  All other systems reviewed and are negative.   Allergies  Review of patient's allergies indicates no known allergies.  Home Medications   Prior to Admission medications   Medication Sig Start Date End Date Taking? Authorizing Provider  glyBURIDE (DIABETA) 5 MG tablet Take 1 tablet (5 mg total) by mouth 2 (two) times daily with a meal. Patient not taking: Reported on 07/16/2014 10/03/13   James Peers, MD  insulin glargine (LANTUS)  100 UNIT/ML injection Inject 0.1 mLs (10 Units total) into the skin at bedtime. Patient not taking: Reported on 07/16/2014 10/03/13   James Sciacca, PA-C  metaxalone (SKELAXIN) 800 MG tablet Take 1 tablet (800 mg total) by mouth 3 (three) times daily. 11/18/15   James Picket, PA-C  metFORMIN (GLUCOPHAGE) 1000 MG tablet Take 1 tablet (1,000 mg total) by mouth 2 (two) times daily. Patient not taking: Reported on 07/16/2014 10/03/13   James Peers, MD  naproxen (NAPROSYN) 500 MG tablet Take 1 tablet (500 mg total) by mouth 2 (two) times daily with a meal. 11/18/15   James Picket, PA-C  oxyCODONE-acetaminophen (PERCOCET) 5-325 MG per tablet 1-2 tabs po q6 hours prn pain 07/16/14   James Cover, MD  sulfamethoxazole-trimethoprim (BACTRIM DS) 800-160 MG per tablet Take 1 tablet by mouth 2 (two) times daily. 07/16/14   James Cover, MD   Meds Ordered and Administered this Visit  Medications - No data to display  BP 132/86 mmHg  Pulse 83  Temp(Src) 98.1 F (36.7 C) (Oral)  Resp 16  Ht 6' (1.829 m)  Wt 175 lb (79.379 kg)  BMI 23.73 kg/m2  SpO2 100% No data found.   Physical Exam  Constitutional: He is oriented to person, place, and time. He appears well-developed and well-nourished. No distress.  HENT:  Head: Normocephalic and atraumatic.  Eyes: Conjunctivae are normal. Pupils are equal, round, and reactive to  light.  Neck: Normal range of motion. Neck supple.  Musculoskeletal: Normal range of motion. He exhibits no edema or tenderness.  Examination of lumbar spine shows a level pelvis in stance. Patient is able to forward flex fully and lateral flexion without discomfort. Able to toe and heel walk adequately. DTRs are trace over 4 with reinforcement. There is tenderness to the lower lumbar segments.  Neurological: He is alert and oriented to person, place, and time.  Skin: Skin is warm and dry. He is not diaphoretic.  Psychiatric: He has a normal mood and affect. His behavior is normal. Judgment  and thought content normal.  Nursing note and vitals reviewed.   ED Course  Procedures (including critical care time)  Labs Review Labs Reviewed - No data to display  Imaging Review Dg Lumbar Spine Complete  11/18/2015  CLINICAL DATA:  30 year old male with fall and low back pain. EXAM: LUMBAR SPINE - COMPLETE 4+ VIEW COMPARISON:  None. FINDINGS: There is no acute fracture or subluxation. There is minimal loss of vertebral body height and anterior wedging of the T11-L1, age indeterminate, likely old. Clinical correlation is recommended. The visualized transverse and spinous processes are intact. The soft tissues appear unremarkable. IMPRESSION: No definite acute fracture or subluxation. Minimal compression deformity of the T11-L1 vertebrae, age indeterminate, likely old. Clinical correlation is recommended. Electronically Signed   By: [name] M.D.   On: 11/18/2015 20:09     Visual Acuity Review  Right Eye Distance: 20/25 Left Eye Distance: 20/25 Bilateral Distance: 20/25  Right Eye Near:   Left Eye Near:    Bilateral Near:         MDM   1. Chalazion of right upper eyelid   2. Lumbar strain, initial encounter    Discharge Medication List as of 11/18/2015  8:29 PM    START taking these medications   Details  metaxalone (SKELAXIN) 800 MG tablet Take 1 tablet (800 mg total) by mouth 3 (three) times daily., Starting 11/18/2015, Until Discontinued, Print    naproxen (NAPROSYN) 500 MG tablet Take 1 tablet (500 mg total) by mouth 2 (two) times daily with a meal., Starting 11/18/2015, Until Discontinued, Print      Plan: 1. Test/x-ray results and diagnosis reviewed with patient 2. rx as per orders; risks, benefits, potential side effects reviewed with patient 3. Recommend supportive treatment with Rest and symptom avoidance. Recommended heat or ice as necessary. Replacement Naprosyn and Skelaxin. Not to take the Skelaxin with activities are required judgment and concentration  certainly while not driving. For his chalazion, recommended continued warm compresses which he is not doing and if it does not to decompress he should see an ophthalmologist for drainage. Given him the name of [place] [facility]. 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 4. F/u prn if symptoms worsen or don't improve     James Picket, PA-C 11/18/15 2037

## 2017-03-23 DIAGNOSIS — R109 Unspecified abdominal pain: Secondary | ICD-10-CM | POA: Insufficient documentation

## 2017-03-23 DIAGNOSIS — R6881 Early satiety: Secondary | ICD-10-CM | POA: Insufficient documentation

## 2017-03-23 DIAGNOSIS — R197 Diarrhea, unspecified: Secondary | ICD-10-CM | POA: Insufficient documentation

## 2017-03-23 DIAGNOSIS — R1084 Generalized abdominal pain: Secondary | ICD-10-CM | POA: Insufficient documentation

## 2019-12-02 ENCOUNTER — Encounter (HOSPITAL_COMMUNITY): Payer: Self-pay

## 2019-12-02 ENCOUNTER — Inpatient Hospital Stay (HOSPITAL_COMMUNITY)
Admission: EM | Admit: 2019-12-02 | Discharge: 2019-12-05 | DRG: 682 | Disposition: A | Discharge status: Home / Self Care | Payer: BC Managed Care – PPO | Attending: Family Medicine | Admitting: Family Medicine

## 2019-12-02 ENCOUNTER — Other Ambulatory Visit: Payer: Self-pay

## 2019-12-02 ENCOUNTER — Emergency Department (HOSPITAL_COMMUNITY): Payer: BC Managed Care – PPO

## 2019-12-02 ENCOUNTER — Inpatient Hospital Stay (HOSPITAL_COMMUNITY): Payer: BC Managed Care – PPO

## 2019-12-02 DIAGNOSIS — I10 Essential (primary) hypertension: Secondary | ICD-10-CM

## 2019-12-02 DIAGNOSIS — Z803 Family history of malignant neoplasm of breast: Secondary | ICD-10-CM

## 2019-12-02 DIAGNOSIS — Z8616 Personal history of COVID-19: Secondary | ICD-10-CM | POA: Diagnosis not present

## 2019-12-02 DIAGNOSIS — N2881 Hypertrophy of kidney: Secondary | ICD-10-CM | POA: Diagnosis present

## 2019-12-02 DIAGNOSIS — N1831 Chronic kidney disease, stage 3a: Secondary | ICD-10-CM | POA: Diagnosis present

## 2019-12-02 DIAGNOSIS — E119 Type 2 diabetes mellitus without complications: Secondary | ICD-10-CM | POA: Diagnosis not present

## 2019-12-02 DIAGNOSIS — Z794 Long term (current) use of insulin: Secondary | ICD-10-CM

## 2019-12-02 DIAGNOSIS — R0902 Hypoxemia: Secondary | ICD-10-CM

## 2019-12-02 DIAGNOSIS — N184 Chronic kidney disease, stage 4 (severe): Secondary | ICD-10-CM | POA: Diagnosis present

## 2019-12-02 DIAGNOSIS — E7800 Pure hypercholesterolemia, unspecified: Secondary | ICD-10-CM | POA: Diagnosis present

## 2019-12-02 DIAGNOSIS — Z20822 Contact with and (suspected) exposure to covid-19: Secondary | ICD-10-CM | POA: Diagnosis present

## 2019-12-02 DIAGNOSIS — I5033 Acute on chronic diastolic (congestive) heart failure: Secondary | ICD-10-CM | POA: Diagnosis present

## 2019-12-02 DIAGNOSIS — Z823 Family history of stroke: Secondary | ICD-10-CM

## 2019-12-02 DIAGNOSIS — Z8249 Family history of ischemic heart disease and other diseases of the circulatory system: Secondary | ICD-10-CM | POA: Diagnosis not present

## 2019-12-02 DIAGNOSIS — R61 Generalized hyperhidrosis: Secondary | ICD-10-CM | POA: Diagnosis present

## 2019-12-02 DIAGNOSIS — Z79899 Other long term (current) drug therapy: Secondary | ICD-10-CM | POA: Diagnosis not present

## 2019-12-02 DIAGNOSIS — I130 Hypertensive heart and chronic kidney disease with heart failure and stage 1 through stage 4 chronic kidney disease, or unspecified chronic kidney disease: Secondary | ICD-10-CM | POA: Diagnosis present

## 2019-12-02 DIAGNOSIS — Z833 Family history of diabetes mellitus: Secondary | ICD-10-CM | POA: Diagnosis not present

## 2019-12-02 DIAGNOSIS — J9811 Atelectasis: Secondary | ICD-10-CM | POA: Diagnosis present

## 2019-12-02 DIAGNOSIS — E1122 Type 2 diabetes mellitus with diabetic chronic kidney disease: Secondary | ICD-10-CM

## 2019-12-02 DIAGNOSIS — E109 Type 1 diabetes mellitus without complications: Secondary | ICD-10-CM

## 2019-12-02 DIAGNOSIS — N179 Acute kidney failure, unspecified: Principal | ICD-10-CM

## 2019-12-02 DIAGNOSIS — R0602 Shortness of breath: Secondary | ICD-10-CM

## 2019-12-02 DIAGNOSIS — K30 Functional dyspepsia: Secondary | ICD-10-CM | POA: Diagnosis present

## 2019-12-02 DIAGNOSIS — R112 Nausea with vomiting, unspecified: Secondary | ICD-10-CM | POA: Diagnosis present

## 2019-12-02 DIAGNOSIS — M7989 Other specified soft tissue disorders: Secondary | ICD-10-CM

## 2019-12-02 DIAGNOSIS — I160 Hypertensive urgency: Secondary | ICD-10-CM | POA: Diagnosis present

## 2019-12-02 DIAGNOSIS — R609 Edema, unspecified: Secondary | ICD-10-CM

## 2019-12-02 DIAGNOSIS — D638 Anemia in other chronic diseases classified elsewhere: Secondary | ICD-10-CM

## 2019-12-02 DIAGNOSIS — D649 Anemia, unspecified: Secondary | ICD-10-CM

## 2019-12-02 DIAGNOSIS — D631 Anemia in chronic kidney disease: Secondary | ICD-10-CM | POA: Diagnosis present

## 2019-12-02 LAB — BASIC METABOLIC PANEL
Anion gap: 8 (ref 5–15)
BUN: 33 mg/dL — ABNORMAL HIGH (ref 6–20)
CO2: 24 mmol/L (ref 22–32)
Calcium: 8 mg/dL — ABNORMAL LOW (ref 8.9–10.3)
Chloride: 109 mmol/L (ref 98–111)
Creatinine, Ser: 2.58 mg/dL — ABNORMAL HIGH (ref 0.61–1.24)
GFR calc Af Amer: 36 mL/min — ABNORMAL LOW (ref >60)
GFR calc non Af Amer: 31 mL/min — ABNORMAL LOW (ref >60)
Glucose, Bld: 132 mg/dL — ABNORMAL HIGH (ref 70–99)
Potassium: 4.1 mmol/L (ref 3.5–5.1)
Sodium: 141 mmol/L (ref 135–145)

## 2019-12-02 LAB — LIPID PANEL
Cholesterol: 248 mg/dL — ABNORMAL HIGH (ref 0–200)
HDL: 39 mg/dL — ABNORMAL LOW (ref >40)
LDL Cholesterol: 187 mg/dL — ABNORMAL HIGH (ref 0–99)
Total CHOL/HDL Ratio: 6.4 RATIO
Triglycerides: 108 mg/dL (ref <150)
VLDL: 22 mg/dL (ref 0–40)

## 2019-12-02 LAB — CBC WITH DIFFERENTIAL/PLATELET
Abs Immature Granulocytes: 0.02 10*3/uL (ref 0.00–0.07)
Basophils Absolute: 0 10*3/uL (ref 0.0–0.1)
Basophils Relative: 1 %
Eosinophils Absolute: 0.2 10*3/uL (ref 0.0–0.5)
Eosinophils Relative: 2 %
HCT: 24.5 % — ABNORMAL LOW (ref 39.0–52.0)
Hemoglobin: 7.5 g/dL — ABNORMAL LOW (ref 13.0–17.0)
Immature Granulocytes: 0 %
Lymphocytes Relative: 17 %
Lymphs Abs: 1.5 10*3/uL (ref 0.7–4.0)
MCH: 25.2 pg — ABNORMAL LOW (ref 26.0–34.0)
MCHC: 30.6 g/dL (ref 30.0–36.0)
MCV: 82.2 fL (ref 80.0–100.0)
Monocytes Absolute: 0.7 10*3/uL (ref 0.1–1.0)
Monocytes Relative: 8 %
Neutro Abs: 6.2 10*3/uL (ref 1.7–7.7)
Neutrophils Relative %: 72 %
Platelets: 383 10*3/uL (ref 150–400)
RBC: 2.98 MIL/uL — ABNORMAL LOW (ref 4.22–5.81)
RDW: 15 % (ref 11.5–15.5)
WBC: 8.6 10*3/uL (ref 4.0–10.5)
nRBC: 0 % (ref 0.0–0.2)

## 2019-12-02 LAB — SARS CORONAVIRUS 2 BY RT PCR (HOSPITAL ORDER, PERFORMED IN ~~LOC~~ HOSPITAL LAB): SARS Coronavirus 2: NEGATIVE

## 2019-12-02 LAB — TSH: TSH: 1.953 u[IU]/mL (ref 0.350–4.500)

## 2019-12-02 LAB — HEMOGLOBIN AND HEMATOCRIT, BLOOD
HCT: 24.3 % — ABNORMAL LOW (ref 39.0–52.0)
Hemoglobin: 7.5 g/dL — ABNORMAL LOW (ref 13.0–17.0)

## 2019-12-02 LAB — GLUCOSE, CAPILLARY
Glucose-Capillary: 113 mg/dL — ABNORMAL HIGH (ref 70–99)
Glucose-Capillary: 111 mg/dL — ABNORMAL HIGH (ref 70–99)

## 2019-12-02 LAB — IRON AND TIBC
Iron: 42 ug/dL — ABNORMAL LOW (ref 45–182)
Saturation Ratios: 15 % — ABNORMAL LOW (ref 17.9–39.5)
TIBC: 271 ug/dL (ref 250–450)
UIBC: 229 ug/dL

## 2019-12-02 LAB — FOLATE: Folate: 9.9 ng/mL (ref >5.9)

## 2019-12-02 LAB — HEMOGLOBIN A1C
Hgb A1c MFr Bld: 6.8 % — ABNORMAL HIGH (ref 4.8–5.6)
Mean Plasma Glucose: 148.46 mg/dL

## 2019-12-02 LAB — VITAMIN B12: Vitamin B-12: 392 pg/mL (ref 180–914)

## 2019-12-02 LAB — FERRITIN: Ferritin: 196 ng/mL (ref 24–336)

## 2019-12-02 LAB — POC OCCULT BLOOD, ED: Fecal Occult Bld: NEGATIVE

## 2019-12-02 LAB — BRAIN NATRIURETIC PEPTIDE: B Natriuretic Peptide: 451.7 pg/mL — ABNORMAL HIGH (ref 0.0–100.0)

## 2019-12-02 MED ORDER — INSULIN ASPART 100 UNIT/ML ~~LOC~~ SOLN: 0.0000 [IU] | Freq: Three times a day (TID) | SUBCUTANEOUS | Status: DC

## 2019-12-02 MED ORDER — HYDRALAZINE HCL 25 MG PO TABS: 25.0000 mg | ORAL_TABLET | ORAL | Status: DC | PRN

## 2019-12-02 MED ORDER — ACETAMINOPHEN 325 MG PO TABS
650.0000 mg | ORAL_TABLET | ORAL | Status: DC | PRN
  Administered 2019-12-02: 650 mg via ORAL
  Filled 2019-12-02: qty 2

## 2019-12-02 MED ORDER — LABETALOL HCL 5 MG/ML IV SOLN
10.0000 mg | INTRAVENOUS | Status: DC | PRN
  Administered 2019-12-02 – 2019-12-03 (×3): 10 mg via INTRAVENOUS
  Filled 2019-12-02 (×3): qty 4

## 2019-12-02 MED ORDER — INSULIN GLARGINE 100 UNIT/ML ~~LOC~~ SOLN
30.0000 [IU] | Freq: Every day | SUBCUTANEOUS | Status: DC
  Filled 2019-12-02: qty 0.3

## 2019-12-02 MED ORDER — SODIUM CHLORIDE 0.9% FLUSH
3.0000 mL | Freq: Two times a day (BID) | INTRAVENOUS | Status: DC
  Administered 2019-12-02 – 2019-12-04 (×4): 3 mL via INTRAVENOUS

## 2019-12-02 MED ORDER — SODIUM CHLORIDE 0.9 % IV SOLN: INTRAVENOUS | Status: DC

## 2019-12-02 MED ORDER — ONDANSETRON HCL 4 MG/2ML IJ SOLN
4.0000 mg | Freq: Four times a day (QID) | INTRAMUSCULAR | Status: DC | PRN
  Administered 2019-12-02: 4 mg via INTRAVENOUS
  Filled 2019-12-02 (×2): qty 2

## 2019-12-02 MED ORDER — HEPARIN SODIUM (PORCINE) 5000 UNIT/ML IJ SOLN
5000.0000 [IU] | Freq: Three times a day (TID) | INTRAMUSCULAR | Status: DC
  Filled 2019-12-02 (×2): qty 1

## 2019-12-02 MED ORDER — AMLODIPINE BESYLATE 10 MG PO TABS
10.0000 mg | ORAL_TABLET | Freq: Every day | ORAL | Status: DC
  Administered 2019-12-02 – 2019-12-05 (×4): 10 mg via ORAL
  Filled 2019-12-02 (×2): qty 1
  Filled 2019-12-02: qty 2
  Filled 2019-12-02: qty 1

## 2019-12-02 MED ORDER — INSULIN GLARGINE 100 UNIT/ML ~~LOC~~ SOLN
30.0000 [IU] | Freq: Every day | SUBCUTANEOUS | Status: DC
  Administered 2019-12-03: 30 [IU] via SUBCUTANEOUS
  Filled 2019-12-02 (×2): qty 0.3

## 2019-12-02 NOTE — Progress Notes (Signed)
Pt has reported emesis x2 since 1900 this evening. Reports that he does not feel nauseas, but as soon as he eats anything he vomits. Refuses medication for nausea and vomiting at this time. Pt encouraged to let this RN know if he changes his mind about medication. During nighttime medication administration, pt reported that he takes his Lantus in the morning at home. NP on call, [place], notified and administration time changed to tomorrow morning.

## 2019-12-02 NOTE — ED Notes (Signed)
Vascular at bedside

## 2019-12-02 NOTE — Assessment & Plan Note (Signed)
-  Last available records in care everywhere, patient had normal creatinine in September 2018 and does not appear to have had renal dysfunction prior to that either.  Differential includes prerenal/volume depletion from ongoing nausea/vomiting and diarrhea over the last several weeks vs hypertensive nephropathy from uncontrolled hypertension (elevated today) vs diabetic nephropathy from uncontrolled diabetes over the years vs combo -Obtain renal ultrasound -Start on IV fluids -will avoid nephrotoxic agents as able; will control hypertension and glucose levels as indicated

## 2019-12-02 NOTE — Plan of Care (Signed)
  Problem: Education: Goal: Knowledge of General Education information will improve Description: Including pain rating scale, medication(s)/side effects and non-pharmacologic comfort measures Outcome: Progressing   Problem: Health Behavior/Discharge Planning: Goal: Ability to manage health-related needs will improve Outcome: Progressing   Problem: Clinical Measurements: Goal: Ability to maintain clinical measurements within normal limits will improve Outcome: Progressing Goal: Will remain free from infection Outcome: Progressing Goal: Diagnostic test results will improve Outcome: Progressing Goal: Respiratory complications will improve Outcome: Progressing Goal: Cardiovascular complication will be avoided Outcome: Progressing   Problem: Activity: Goal: Risk for activity intolerance will decrease Outcome: Progressing   Problem: Coping: Goal: Level of anxiety will decrease Outcome: Progressing   Problem: Elimination: Goal: Will not experience complications related to urinary retention Outcome: Progressing   Problem: Pain Managment: Goal: General experience of comfort will improve Outcome: Progressing   Problem: Safety: Goal: Ability to remain free from injury will improve Outcome: Progressing   Problem: Skin Integrity: Goal: Risk for impaired skin integrity will decrease Outcome: Progressing   

## 2019-12-02 NOTE — Assessment & Plan Note (Addendum)
-  Hemoglobin 7.5 g/dL on admission.  No prior history of anemia seen in epic however patient does state he was put on an oral iron pill through his PCP semirecently.  No records available for review.  He does follow with [facility] in [facility].  He has not transitioned care here yet -Trend H/H every 6 hours -Larger suspicion at this time that his shortness of breath over the last couple weeks is due to his anemia rather than an underlying PE (but this still is possible) -Check iron levels, ferritin, folate, B12. Will replete as necessary. No FH of colon cancer and no concerning B symptoms except for night sweats; may still warrant further GI workup given substantial Hgb drop -Transfuse for Hgb <7 g/dL

## 2019-12-02 NOTE — Hospital Course (Signed)
[name] is a 34 yo AA male with PMH obesity, DMII, HTN who presents to the ER with worsening shortness of breath over the past 2 weeks.  He does endorse that he had COVID-19 in September 2020, he did not require hospitalization and recovered well at home.  He has not received vaccinations.  He did not develop any clots associated either. He is mainly endorsing an intermittent cough, shortness of breath, nausea with occasional vomiting in the mornings.  If he does not pack lunch for work and eats poorly, he has runny stools occasionally.  He also takes Pepto-Bismol occasionally for indigestion and does note that his stools turn dark/black after Pepto-Bismol but have been brown at other times otherwise.   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  He has tried to manage his diabetes over the years (diagnosed 10 years ago).  He did not tolerate Metformin and stopped it in December 2020 due to GI symptoms.  He also stopped taking his blood pressure medication in January 2021, also due to associated diarrheal symptoms that he perceived.  He states he was on 3 medications but cannot recall the names.  Family history is notable for dad having hypertension, diabetes, stroke, MI.  Sister has hypertension.  Maternal grandma has breast cancer.  There is no family history of colon cancers.  He has no allergies.  He does not use tobacco products nor illicit drug use.  He endorses rare/occasional alcohol use. He does endorse intermittent night sweats which he associates with possible fluctuations in his glucose levels.  He denies any unintentional weight loss and states that he has in fact been gaining some weight and today he is at his heaviest he says.  He underwent CXR in the ER which was notable for some vascular congestion but no obvious interstitial edema nor effusions.  A lower extremity duplex was also obtained which was negative for DVT. Notable labs include: BUN 33, creatinine 2.58 Hemoglobin  7.5, hematocrit 24.5 BNP 451 Bedside FOBT performed by ER provider, negative  Vitals: Temp 99.1, heart rate 92, respirations 16, BP 186/109, 92% on room air  He is admitted for further work-up of his anemia and AKI.

## 2019-12-02 NOTE — ED Provider Notes (Signed)
[facility] DEPT Provider Note   CSN: [id number] Arrival date & time: 12/02/19  9024     History Chief Complaint  Patient presents with  . Shortness of Breath    [name] is a 34 y.o. male with a past medical history of diabetes, hyperlipidemia presenting to the ED with a chief complaint of shortness of breath.  Patient had a Covid infection in September 2020.  States that he recovered and was back to his baseline.  2 weeks ago started having cold-like symptoms including runny nose, congestion and cough.  States that this gradually improved but he continues to have significant shortness of breath worse with exertion.  He reports chest pressure when he experiences shortness of breath.  States that he cannot walk or last for long periods of time without feeling short of breath.  He has been taking DayQuil and NyQuil with some improvement in his cold symptoms.  States that his family members also told him that his left leg looks swollen. He has also been having nausea/vomiting almost every morning since his cold symptoms began.  He denies chest pain at rest, history of DVT, PE, MI, recent immobilization, abdominal pain, urinary symptoms, fever or sick contacts with similar symptoms. He has noticed dark stools for the past 2-3 days but he thought this was due to taking Pepto-Bismol.  HPI     Past Medical History:  Diagnosis Date  . Diabetes mellitus   . High cholesterol     There are no problems to display for this patient.   Past Surgical History:  Procedure Laterality Date  . FRACTURE SURGERY    . I & D EXTREMITY Left 07/16/2014   Procedure: IRRIGATION AND DEBRIDEMENT EXTREMITY/LEFT INDEX FINGER;  Surgeon: Leanora Cover, MD;  Location: [facility];  Service: Orthopedics;  Laterality: Left;       Family History  Problem Relation Age of Onset  . Cancer Mother   . Stroke Father   . Diabetes Father   . Hypertension Father     Social History    Tobacco Use  . Smoking status: Never Smoker  Substance Use Topics  . Alcohol use: Yes  . Drug use: No    Home Medications Prior to Admission medications   Medication Sig Start Date End Date Taking? Authorizing Provider  insulin detemir (LEVEMIR) 100 UNIT/ML injection Inject 30 Units into the skin daily.   Yes [provider]  glyBURIDE (DIABETA) 5 MG tablet Take 1 tablet (5 mg total) by mouth 2 (two) times daily with a meal. Patient not taking: Reported on 12/02/2019 10/03/13   Elyn Peers, MD  insulin glargine (LANTUS) 100 UNIT/ML injection Inject 0.1 mLs (10 Units total) into the skin at bedtime. Patient not taking: Reported on 07/16/2014 10/03/13   Jamse Mead, PA-C  metaxalone (SKELAXIN) 800 MG tablet Take 1 tablet (800 mg total) by mouth 3 (three) times daily. Patient not taking: Reported on 12/02/2019 11/18/15   Lorin Picket, PA-C  metFORMIN (GLUCOPHAGE) 1000 MG tablet Take 1 tablet (1,000 mg total) by mouth 2 (two) times daily. Patient not taking: Reported on 07/16/2014 10/03/13   Elyn Peers, MD  naproxen (NAPROSYN) 500 MG tablet Take 1 tablet (500 mg total) by mouth 2 (two) times daily with a meal. Patient not taking: Reported on 12/02/2019 11/18/15   Lorin Picket, PA-C  oxyCODONE-acetaminophen (PERCOCET) 5-325 MG per tablet 1-2 tabs po q6 hours prn pain Patient not taking: Reported on 12/02/2019 07/16/14  Leanora Cover, MD  sulfamethoxazole-trimethoprim (BACTRIM DS) 800-160 MG per tablet Take 1 tablet by mouth 2 (two) times daily. Patient not taking: Reported on 12/02/2019 07/16/14   Leanora Cover, MD    Allergies    Patient has no known allergies.  Review of Systems   Review of Systems  Constitutional: Negative for appetite change, chills and fever.  HENT: Negative for ear pain, rhinorrhea, sneezing and sore throat.   Eyes: Negative for photophobia and visual disturbance.  Respiratory: Positive for cough, chest tightness and shortness of breath. Negative for  wheezing.   Cardiovascular: Negative for chest pain and palpitations.  Gastrointestinal: Positive for nausea and vomiting. Negative for abdominal pain, blood in stool, constipation and diarrhea.  Genitourinary: Negative for dysuria, hematuria and urgency.  Musculoskeletal: Negative for myalgias.  Skin: Negative for rash.  Neurological: Negative for dizziness, weakness and light-headedness.    Physical Exam Updated Vital Signs BP (!) 153/108   Pulse 98   Temp 99.1 F (37.3 C) (Oral)   Resp 15   Ht 6' (1.829 m)   Wt 105.5 kg   SpO2 95%   BMI 31.55 kg/m   Physical Exam Vitals and nursing note reviewed.  Constitutional:      General: He is not in acute distress.    Appearance: He is well-developed.  HENT:     Head: Normocephalic and atraumatic.     Nose: Nose normal.  Eyes:     General: No scleral icterus.       Right eye: No discharge.        Left eye: No discharge.     Conjunctiva/sclera: Conjunctivae normal.  Cardiovascular:     Rate and Rhythm: Normal rate and regular rhythm.     Heart sounds: Normal heart sounds. No murmur. No friction rub. No gallop.   Pulmonary:     Effort: Pulmonary effort is normal. Tachypnea present. No respiratory distress.     Breath sounds: Normal breath sounds.  Abdominal:     General: Bowel sounds are normal. There is no distension.     Palpations: Abdomen is soft.     Tenderness: There is no abdominal tenderness. There is no guarding.  Genitourinary:    Comments: Chaperone present for DRE.  Brown stool noted on exam. Musculoskeletal:        General: Normal range of motion.     Cervical back: Normal range of motion and neck supple.     Left lower leg: Edema present.     Comments: 2+ DP pulses noted bilaterally.  No calf tenderness.  Normal temperature.  Skin:    General: Skin is warm and dry.     Findings: No rash.  Neurological:     Mental Status: He is alert.     Motor: No abnormal muscle tone.     Coordination: Coordination  normal.     ED Results / Procedures / Treatments   Labs (all labs ordered are listed, but only abnormal results are displayed) Labs Reviewed  BASIC METABOLIC PANEL - Abnormal; Notable for the following components:      Result Value   Glucose, Bld 132 (*)    BUN 33 (*)    Creatinine, Ser 2.58 (*)    Calcium 8.0 (*)    GFR calc non Af Amer 31 (*)    GFR calc Af Amer 36 (*)    All other components within normal limits  CBC WITH DIFFERENTIAL/PLATELET - Abnormal; Notable for the following components:   RBC 2.98 (*)  Hemoglobin 7.5 (*)    HCT 24.5 (*)    MCH 25.2 (*)    All other components within normal limits  BRAIN NATRIURETIC PEPTIDE - Abnormal; Notable for the following components:   B Natriuretic Peptide 451.7 (*)    All other components within normal limits  POC OCCULT BLOOD, ED    EKG EKG Interpretation  Date/Time:  Sunday Dec 02 2019 07:38:23 EDT Ventricular Rate:  91 PR Interval:    QRS Duration: 94 QT Interval:  370 QTC Calculation: 456 R Axis:   85 Text Interpretation: Sinus rhythm Nonspecific T abnormalities, lateral leads No old tracing to compare Confirmed by Aletta Edouard [id number]) on 12/02/2019 7:40:40 AM   Radiology DG Chest 2 View  Result Date: 12/02/2019 CLINICAL DATA:  Shortness of breath EXAM: CHEST - 2 VIEW COMPARISON:  None. FINDINGS: Mild bibasilar opacities, favoring atelectasis. Pulmonary vascular congestion without frank interstitial edema. No pleural effusion or pneumothorax. The heart is normal in size. Visualized osseous structures are within normal limits. IMPRESSION: Pulmonary vascular congestion without frank interstitial edema. Mild bibasilar opacities favor atelectasis. Electronically Signed   By: Julian Hy M.D.   On: 12/02/2019 07:39   VAS [place] LOWER EXTREMITY VENOUS (DVT) (ONLY [facility] & [facility] 7a-7p)  Result Date: 12/02/2019  Lower Venous DVTStudy Indications: Swelling, and SOB.  Comparison Study: No prior study Performing Technologist:  Maudry Mayhew MHA, RDMS, RVT, RDCS  Examination Guidelines: A complete evaluation includes B-mode imaging, spectral Doppler, color Doppler, and power Doppler as needed of all accessible portions of each vessel. Bilateral testing is considered an integral part of a complete examination. Limited examinations for reoccurring indications may be performed as noted. The reflux portion of the exam is performed with the patient in reverse Trendelenburg.  +-----+---------------+---------+-----------+----------+--------------+ RIGHTCompressibilityPhasicitySpontaneityPropertiesThrombus Aging +-----+---------------+---------+-----------+----------+--------------+ CFV  Full           Yes      Yes                                 +-----+---------------+---------+-----------+----------+--------------+   +---------+---------------+---------+-----------+----------+--------------+ LEFT     CompressibilityPhasicitySpontaneityPropertiesThrombus Aging +---------+---------------+---------+-----------+----------+--------------+ CFV      Full           Yes      Yes                                 +---------+---------------+---------+-----------+----------+--------------+ SFJ      Full                                                        +---------+---------------+---------+-----------+----------+--------------+ FV Prox  Full                                                        +---------+---------------+---------+-----------+----------+--------------+ FV Mid   Full                                                        +---------+---------------+---------+-----------+----------+--------------+  FV DistalFull                                                        +---------+---------------+---------+-----------+----------+--------------+ PFV      Full                                                         +---------+---------------+---------+-----------+----------+--------------+ POP      Full           Yes      Yes                                 +---------+---------------+---------+-----------+----------+--------------+ PTV      Full                                                        +---------+---------------+---------+-----------+----------+--------------+ PERO     Full                                                        +---------+---------------+---------+-----------+----------+--------------+     Summary: RIGHT: - No evidence of common femoral vein obstruction.  LEFT: - There is no evidence of deep vein thrombosis in the lower extremity.  - No cystic structure found in the popliteal fossa.  *See table(s) above for measurements and observations.    Preliminary     Procedures .Critical Care Performed by: Delia Heady, PA-C Authorized by: Delia Heady, PA-C   Critical care provider statement:    Critical care time (minutes):  35   Critical care time was exclusive of:  Separately billable procedures and treating other patients   Critical care was necessary to treat or prevent imminent or life-threatening deterioration of the following conditions:  Circulatory failure, renal failure, respiratory failure and cardiac failure   Critical care was time spent personally by me on the following activities:  Development of treatment plan with patient or surrogate, discussions with consultants, evaluation of patient's response to treatment, examination of patient, obtaining history from patient or surrogate, ordering and review of laboratory studies, ordering and review of radiographic studies, pulse oximetry, re-evaluation of patient's condition and review of old charts   I assumed direction of critical care for this patient from another provider in my specialty: no     (including critical care time)  Medications Ordered in ED Medications - No data to display  ED Course  I  have reviewed the triage vital signs and the nursing notes.  Pertinent labs & imaging results that were available during my care of the patient were reviewed by me and considered in my medical decision making (see chart for details).  Clinical Course as of Dec 02 950  Sun Dec 01, 8253  6085 34 year old male here with few weeks of  increased shortness of breath dyspnea on exertion.  Remote history of Covid.  States he had a cold with a cough and thinks that is what his shortness of breath is from.  Transiently desats at times in the room off of oxygen and with a good waveform.  Lab work showing low hemoglobin.  He said he is been told he is anemic before.   [MB]  [id number] Fecal Occult Blood, POC: NEGATIVE [HK]  0924 Negative for DVT.  VAS [place] LOWER EXTREMITY VENOUS (DVT) (ONLY [facility] & [facility] 7a-7p) [HK]  0925 Up from 1 in 2018.  Creatinine(!): 2.58 [HK]    Clinical Course User Index [HK] Delia Heady, PA-C [MB] Hayden Rasmussen, MD   MDM Rules/Calculators/A&P                      34 year old male with past medical history of diabetes, hyperlipidemia presenting to the ED with a chief complaint of shortness of breath.  Patient had Covid infection in September 2020.  States that he recovered and was back to baseline until about 2 weeks ago.  Reports cold-like symptoms including runny nose, congestion and cough.  He reports shortness of breath that is been significant for the past week and worse with exertion.  Reports chest pressure when he has a shortness of breath.  Also reports nausea, vomiting almost every morning since cold symptoms began.  On exam there is some edema noted of the left lower extremity.  No calf tenderness.  Patient oxygen saturations 89 to 93% on room air with good waveform.  EKG shows sinus rhythm with nonspecific T wave abnormalities.  Chest x-ray shows pulmonary congestion without edema.  DVT study is negative.  Lab work significant for hemoglobin of 7.5, most recent for comparison was  13 back in 2019.  BMP significant for creatinine of 2.5, BUN of 33, elevated from normal in 2019.  Hemoccult is negative.  BNP slightly elevated to 450. Patient will require admission for his AKI, symptomatic anemia and possible new onset of CHF requiring an echo. We will asked hospitalist to admit.   All imaging, if done today, including plain films, CT scans, and ultrasounds, independently reviewed by me, and interpretations confirmed via formal radiology reads.   Portions of this note were generated with [employer]. Dictation errors may occur despite best attempts at proofreading.  Final Clinical Impression(s) / ED Diagnoses Final diagnoses:  Hypoxia  AKI (acute kidney injury) ([place])  Peripheral edema  Symptomatic anemia    Rx / DC Orders ED Discharge Orders    None       Delia Heady, PA-C 12/02/19 [id number]    Hayden Rasmussen, MD 12/02/19 Einar Crow

## 2019-12-02 NOTE — Assessment & Plan Note (Signed)
-   check A1c, last A1c 12.8% in 2009 -Start on Lantus 30 units nightly for now and adjust further as necessary -Start on SSI -Carb consistent diet

## 2019-12-02 NOTE — Assessment & Plan Note (Addendum)
-   see anemia and HTN - differential includes viral illness (cold like sxms) vs anemia vs CHF vs uncontrolled HTN vs PE (unable to obtain CTA due to renal function, LE duplex negative for DVT) - treating above disease states

## 2019-12-02 NOTE — ED Triage Notes (Signed)
Pt reports shortness of breath and nausea in the morning for the last couple of weeks. He reports that he had COVID in Sept and recovered. About 2 weeks ago, he states that he had a cold and that's when the breathing issues started. He states that it is specifically worse at night. A&Ox4. Ambulatory.

## 2019-12-02 NOTE — H&P (Addendum)
History and Physical    [name]  UEA:[id number]  DOB: Oct 25, 1985  DOA: 12/02/2019  PCP: Patient, No Pcp Per---Previously following with [facility] in [place], [place] Patient coming from: home  Chief Complaint: SOB x 2 weeks  HPI:  [name] is a 34 yo AA male with PMH obesity, DMII, HTN who presents to the ER with worsening shortness of breath over the past 2 weeks.  He does endorse that he had COVID-19 in September 2020, he did not require hospitalization and recovered well at home.  He has not received vaccinations.  He did not develop any clots associated either. He is mainly endorsing an intermittent cough, shortness of breath, nausea with occasional vomiting in the mornings.  If he does not pack lunch for work and eats poorly, he has runny stools occasionally.  He also takes Pepto-Bismol occasionally for indigestion and does note that his stools turn dark/black after Pepto-Bismol but have been brown at other times otherwise.   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  He has tried to manage his diabetes over the years (diagnosed 10 years ago).  He did not tolerate Metformin and stopped it in December 2020 due to GI symptoms.  He also stopped taking his blood pressure medication in January 2021, also due to associated diarrheal symptoms that he perceived.  He states he was on 3 medications but cannot recall the names.  Family history is notable for dad having hypertension, diabetes, stroke, MI.  Sister has hypertension.  Maternal grandma has breast cancer.  There is no family history of colon cancers.  He has no allergies.  He does not use tobacco products nor illicit drug use.  He endorses rare/occasional alcohol use. He does endorse intermittent night sweats which he associates with possible fluctuations in his glucose levels.  He denies any unintentional weight loss and states that he has in fact been gaining some weight and today he is at his  heaviest he says.  He underwent CXR in the ER which was notable for some vascular congestion but no obvious interstitial edema nor effusions.  A lower extremity duplex was also obtained which was negative for DVT. Notable labs include: BUN 33, creatinine 2.58 Hemoglobin 7.5, hematocrit 24.5 BNP 451 Bedside FOBT performed by ER provider, negative  Vitals: Temp 99.1, heart rate 92, respirations 16, BP 186/109, 92% on room air  He is admitted for further work-up of his anemia and AKI.    I have personally briefly reviewed patient's old medical records in [place]  Assessment/Plan: AKI (acute kidney injury) ([place]) -Last available records in care everywhere, patient had normal creatinine in September 2018 and does not appear to have had renal dysfunction prior to that either.  Differential includes prerenal/volume depletion from ongoing nausea/vomiting and diarrhea over the last several weeks vs hypertensive nephropathy from uncontrolled hypertension (elevated today) vs diabetic nephropathy from uncontrolled diabetes over the years vs combo -Obtain renal ultrasound -Start on IV fluids -will avoid nephrotoxic agents as able; will control hypertension and glucose levels as indicated  Anemia -Hemoglobin 7.5 g/dL on admission.  No prior history of anemia seen in epic however patient does state he was put on an oral iron pill through his PCP semirecently.  No records available for review.  He does follow with [facility] in [facility].  He has not transitioned care here yet -Trend H/H every 6 hours -Larger suspicion at this time  that his shortness of breath over the last couple weeks is due to his anemia rather than an underlying PE (but this still is possible) -Check iron levels, ferritin, folate, B12. Will replete as necessary. No FH of colon cancer and no concerning B symptoms except for night sweats; may still warrant further GI workup given substantial  Hgb drop -Transfuse for Hgb <7 g/dL  Essential hypertension - BP uncontrolled on admission; patient states he stopped taking 3 blood pressure pills in approximately January 2021 due to GI symptoms, however this may have been lingering GI symptoms from his Metformin which he had also just stopped the month prior -Will resume back on a blood pressure regimen and adjust as necessary. Start with amlodipine (unable to use ACEi/ARB nor diuretic at this time, but possibly in future if renal fxn improves) -Also use labetalol, hydralazine as needed - no LVH on EKG; BNP elevated which may be in setting of renal dysfunction;  Hold off on echo at this time; if develops worsening SOB or edema after IVF for AKI then may need to further consider CHF also and obtain echo   SOB (shortness of breath) - see anemia and HTN - differential includes viral illness (cold like sxms) vs anemia vs CHF vs uncontrolled HTN vs PE (unable to obtain CTA due to renal function, LE duplex negative for DVT) - treating above disease states  Type 2 diabetes mellitus without complication ([place]) - check A1c, last A1c 12.8% in 2009 -Start on Lantus 30 units nightly for now and adjust further as necessary -Start on SSI -Carb consistent diet   Code Status: Full DVT Prophylaxis:unfractionated SQ heparin 5000 units 2 hours prior to surgery then every 12 hours Anticipated disposition is to home  History: Past Medical History:  Diagnosis Date  . Diabetes mellitus   . High cholesterol     Past Surgical History:  Procedure Laterality Date  . FRACTURE SURGERY    . I & D EXTREMITY Left 07/16/2014   Procedure: IRRIGATION AND DEBRIDEMENT EXTREMITY/LEFT INDEX FINGER;  Surgeon: Leanora Cover, MD;  Location: [facility];  Service: Orthopedics;  Laterality: Left;     reports that he has never smoked. He does not have any smokeless tobacco history on file. He reports current alcohol use. He reports that he does not use drugs.  No Known  Allergies  Family History  Problem Relation Age of Onset  . Cancer Mother   . Stroke Father   . Diabetes Father   . Hypertension Father    Home Medications: Prior to Admission medications   Medication Sig Start Date End Date Taking? Authorizing Provider  insulin detemir (LEVEMIR) 100 UNIT/ML injection Inject 30 Units into the skin daily.   Yes [provider]  glyBURIDE (DIABETA) 5 MG tablet Take 1 tablet (5 mg total) by mouth 2 (two) times daily with a meal. Patient not taking: Reported on 12/02/2019 10/03/13   Elyn Peers, MD  insulin glargine (LANTUS) 100 UNIT/ML injection Inject 0.1 mLs (10 Units total) into the skin at bedtime. Patient not taking: Reported on 07/16/2014 10/03/13   Jamse Mead, PA-C  metaxalone (SKELAXIN) 800 MG tablet Take 1 tablet (800 mg total) by mouth 3 (three) times daily. Patient not taking: Reported on 12/02/2019 11/18/15   Lorin Picket, PA-C  metFORMIN (GLUCOPHAGE) 1000 MG tablet Take 1 tablet (1,000 mg total) by mouth 2 (two) times daily. Patient not taking: Reported on 07/16/2014 10/03/13   Elyn Peers, MD  naproxen (NAPROSYN) 500 MG tablet  Take 1 tablet (500 mg total) by mouth 2 (two) times daily with a meal. Patient not taking: Reported on 12/02/2019 11/18/15   Lorin Picket, PA-C  oxyCODONE-acetaminophen (PERCOCET) 5-325 MG per tablet 1-2 tabs po q6 hours prn pain Patient not taking: Reported on 12/02/2019 07/16/14   Leanora Cover, MD  sulfamethoxazole-trimethoprim (BACTRIM DS) 800-160 MG per tablet Take 1 tablet by mouth 2 (two) times daily. Patient not taking: Reported on 12/02/2019 07/16/14   Leanora Cover, MD    Review of Systems:  Pertinent items noted in HPI and remainder of comprehensive ROS otherwise negative.  Physical Exam: Vitals:   12/02/19 0627 12/02/19 0637 12/02/19 0941 12/02/19 1000  BP: (!) 186/109  (!) 153/108 (!) 175/107  Pulse: 92  98 91  Resp: 16  15 (!) 21  Temp: 99.1 F (37.3 C)     TempSrc: Oral     SpO2: 92% (!)  89% 95% (!) 89%  Weight: 105.5 kg     Height: 6' (1.829 m)      General appearance: alert, cooperative, fatigued and no distress Head: Normocephalic, without obvious abnormality Eyes: EOMI Lungs: clear to auscultation bilaterally Heart: regular rate and rhythm and S1, S2 normal Abdomen: normal findings: bowel sounds normal and soft, non-tender Extremities: no edema Skin: mobility and turgor normal Neurologic: Grossly normal  Labs on Admission:  I have personally reviewed following labs and imaging studies Results for orders placed or performed during the hospital encounter of 12/02/19 (from the past 24 hour(s))  Basic metabolic panel     Status: Abnormal   Collection Time: 12/02/19  7:37 AM  Result Value Ref Range   Sodium 141 135 - 145 mmol/L   Potassium 4.1 3.5 - 5.1 mmol/L   Chloride 109 98 - 111 mmol/L   CO2 24 22 - 32 mmol/L   Glucose, Bld 132 (H) 70 - 99 mg/dL   BUN 33 (H) 6 - 20 mg/dL   Creatinine, Ser 2.58 (H) 0.61 - 1.24 mg/dL   Calcium 8.0 (L) 8.9 - 10.3 mg/dL   GFR calc non Af Amer 31 (L) >60 mL/min   GFR calc Af Amer 36 (L) >60 mL/min   Anion gap 8 5 - 15  CBC with Differential     Status: Abnormal   Collection Time: 12/02/19  7:37 AM  Result Value Ref Range   WBC 8.6 4.0 - 10.5 K/uL   RBC 2.98 (L) 4.22 - 5.81 MIL/uL   Hemoglobin 7.5 (L) 13.0 - 17.0 g/dL   HCT 24.5 (L) 39.0 - 52.0 %   MCV 82.2 80.0 - 100.0 fL   MCH 25.2 (L) 26.0 - 34.0 pg   MCHC 30.6 30.0 - 36.0 g/dL   RDW 15.0 11.5 - 15.5 %   Platelets 383 150 - 400 K/uL   nRBC 0.0 0.0 - 0.2 %   Neutrophils Relative % 72 %   Neutro Abs 6.2 1.7 - 7.7 K/uL   Lymphocytes Relative 17 %   Lymphs Abs 1.5 0.7 - 4.0 K/uL   Monocytes Relative 8 %   Monocytes Absolute 0.7 0.1 - 1.0 K/uL   Eosinophils Relative 2 %   Eosinophils Absolute 0.2 0.0 - 0.5 K/uL   Basophils Relative 1 %   Basophils Absolute 0.0 0.0 - 0.1 K/uL   Immature Granulocytes 0 %   Abs Immature Granulocytes 0.02 0.00 - 0.07 K/uL  Brain  natriuretic peptide     Status: Abnormal   Collection Time: 12/02/19  7:44 AM  Result Value Ref Range   B Natriuretic Peptide 451.7 (H) 0.0 - 100.0 pg/mL  POC occult blood, ED     Status: None   Collection Time: 12/02/19  9:09 AM  Result Value Ref Range   Fecal Occult Bld NEGATIVE NEGATIVE     Radiological Exams on Admission: DG Chest 2 View  Result Date: 12/02/2019 CLINICAL DATA:  Shortness of breath EXAM: CHEST - 2 VIEW COMPARISON:  None. FINDINGS: Mild bibasilar opacities, favoring atelectasis. Pulmonary vascular congestion without frank interstitial edema. No pleural effusion or pneumothorax. The heart is normal in size. Visualized osseous structures are within normal limits. IMPRESSION: Pulmonary vascular congestion without frank interstitial edema. Mild bibasilar opacities favor atelectasis. Electronically Signed   By: Julian Hy M.D.   On: 12/02/2019 07:39   VAS [place] LOWER EXTREMITY VENOUS (DVT) (ONLY [facility] & [facility] 7a-7p)  Result Date: 12/02/2019  Lower Venous DVTStudy Indications: Swelling, and SOB.  Comparison Study: No prior study Performing Technologist: Maudry Mayhew MHA, RDMS, RVT, RDCS  Examination Guidelines: A complete evaluation includes B-mode imaging, spectral Doppler, color Doppler, and power Doppler as needed of all accessible portions of each vessel. Bilateral testing is considered an integral part of a complete examination. Limited examinations for reoccurring indications may be performed as noted. The reflux portion of the exam is performed with the patient in reverse Trendelenburg.  +-----+---------------+---------+-----------+----------+--------------+ RIGHTCompressibilityPhasicitySpontaneityPropertiesThrombus Aging +-----+---------------+---------+-----------+----------+--------------+ CFV  Full           Yes      Yes                                 +-----+---------------+---------+-----------+----------+--------------+    +---------+---------------+---------+-----------+----------+--------------+ LEFT     CompressibilityPhasicitySpontaneityPropertiesThrombus Aging +---------+---------------+---------+-----------+----------+--------------+ CFV      Full           Yes      Yes                                 +---------+---------------+---------+-----------+----------+--------------+ SFJ      Full                                                        +---------+---------------+---------+-----------+----------+--------------+ FV Prox  Full                                                        +---------+---------------+---------+-----------+----------+--------------+ FV Mid   Full                                                        +---------+---------------+---------+-----------+----------+--------------+ FV DistalFull                                                        +---------+---------------+---------+-----------+----------+--------------+  PFV      Full                                                        +---------+---------------+---------+-----------+----------+--------------+ POP      Full           Yes      Yes                                 +---------+---------------+---------+-----------+----------+--------------+ PTV      Full                                                        +---------+---------------+---------+-----------+----------+--------------+ PERO     Full                                                        +---------+---------------+---------+-----------+----------+--------------+     Summary: RIGHT: - No evidence of common femoral vein obstruction.  LEFT: - There is no evidence of deep vein thrombosis in the lower extremity.  - No cystic structure found in the popliteal fossa.  *See table(s) above for measurements and observations.    Preliminary    VAS [place] LOWER EXTREMITY VENOUS (DVT) (ONLY [facility] & [facility] 7a-7p)      DG Chest 2  View  Final Result      Consults called:  n/a   EKG: Independently reviewed. NSR    Dwyane Dee, MD [facility] Pager: Secure chat via [place] pager # : [phone]  If 7PM-7AM, please contact night-coverage [email or web address] Use universal [place] password for that web site. If you do not have the password, please call the hospital operator.  12/02/2019, 11:54 AM

## 2019-12-02 NOTE — Assessment & Plan Note (Addendum)
-   BP uncontrolled on admission; patient states he stopped taking 3 blood pressure pills in approximately January 2021 due to GI symptoms, however this may have been lingering GI symptoms from his Metformin which he had also just stopped the month prior -Will resume back on a blood pressure regimen and adjust as necessary. Start with amlodipine (unable to use ACEi/ARB nor diuretic at this time, but possibly in future if renal fxn improves) -Also use labetalol, hydralazine as needed - no LVH on EKG; BNP elevated which may be in setting of renal dysfunction;  Hold off on echo at this time; if develops worsening SOB or edema after IVF for AKI then may need to further consider CHF also and obtain echo

## 2019-12-02 NOTE — Progress Notes (Signed)
Left lower extremity venous duplex completed. Refer to "CV Proc" under chart review to view preliminary results.  12/02/2019 9:33 AM Kelby Aline., MHA, RVT, RDCS, RDMS

## 2019-12-03 ENCOUNTER — Encounter (HOSPITAL_COMMUNITY): Payer: Self-pay | Admitting: Internal Medicine

## 2019-12-03 ENCOUNTER — Other Ambulatory Visit (HOSPITAL_COMMUNITY): Payer: BC Managed Care – PPO

## 2019-12-03 LAB — PREPARE RBC (CROSSMATCH)

## 2019-12-03 LAB — CBC
HCT: 29.5 % — ABNORMAL LOW (ref 39.0–52.0)
Hemoglobin: 9.2 g/dL — ABNORMAL LOW (ref 13.0–17.0)
MCH: 25.5 pg — ABNORMAL LOW (ref 26.0–34.0)
MCHC: 31.2 g/dL (ref 30.0–36.0)
MCV: 81.7 fL (ref 80.0–100.0)
Platelets: 355 10*3/uL (ref 150–400)
RBC: 3.61 MIL/uL — ABNORMAL LOW (ref 4.22–5.81)
RDW: 15.2 % (ref 11.5–15.5)
WBC: 13.9 10*3/uL — ABNORMAL HIGH (ref 4.0–10.5)
nRBC: 0 % (ref 0.0–0.2)

## 2019-12-03 LAB — GLUCOSE, CAPILLARY
Glucose-Capillary: 109 mg/dL — ABNORMAL HIGH (ref 70–99)
Glucose-Capillary: 105 mg/dL — ABNORMAL HIGH (ref 70–99)
Glucose-Capillary: 103 mg/dL — ABNORMAL HIGH (ref 70–99)
Glucose-Capillary: 91 mg/dL (ref 70–99)

## 2019-12-03 LAB — BASIC METABOLIC PANEL
Anion gap: 9 (ref 5–15)
BUN: 29 mg/dL — ABNORMAL HIGH (ref 6–20)
CO2: 26 mmol/L (ref 22–32)
Calcium: 8.3 mg/dL — ABNORMAL LOW (ref 8.9–10.3)
Chloride: 107 mmol/L (ref 98–111)
Creatinine, Ser: 2.53 mg/dL — ABNORMAL HIGH (ref 0.61–1.24)
GFR calc Af Amer: 37 mL/min — ABNORMAL LOW (ref >60)
GFR calc non Af Amer: 32 mL/min — ABNORMAL LOW (ref >60)
Glucose, Bld: 106 mg/dL — ABNORMAL HIGH (ref 70–99)
Potassium: 3.6 mmol/L (ref 3.5–5.1)
Sodium: 142 mmol/L (ref 135–145)

## 2019-12-03 LAB — ABO/RH: ABO/RH(D): O POS

## 2019-12-03 LAB — HEMOGLOBIN AND HEMATOCRIT, BLOOD
HCT: 21.9 % — ABNORMAL LOW (ref 39.0–52.0)
Hemoglobin: 6.7 g/dL — CL (ref 13.0–17.0)

## 2019-12-03 MED ORDER — HYDRALAZINE HCL 50 MG PO TABS
50.0000 mg | ORAL_TABLET | Freq: Three times a day (TID) | ORAL | Status: DC
  Administered 2019-12-03 – 2019-12-05 (×7): 50 mg via ORAL
  Filled 2019-12-03 (×7): qty 1

## 2019-12-03 MED ORDER — SODIUM CHLORIDE 0.9% IV SOLUTION: Freq: Once | INTRAVENOUS | Status: DC

## 2019-12-03 MED ORDER — SODIUM CHLORIDE 0.9% IV SOLUTION: Freq: Once | INTRAVENOUS | Status: AC

## 2019-12-03 MED ORDER — HYDRALAZINE HCL 10 MG PO TABS: 10.0000 mg | ORAL_TABLET | Freq: Three times a day (TID) | ORAL | Status: DC

## 2019-12-03 MED ORDER — PANTOPRAZOLE SODIUM 40 MG PO TBEC
40.0000 mg | DELAYED_RELEASE_TABLET | Freq: Every day | ORAL | Status: DC
  Administered 2019-12-03 – 2019-12-05 (×3): 40 mg via ORAL
  Filled 2019-12-03 (×3): qty 1

## 2019-12-03 MED ORDER — FUROSEMIDE 10 MG/ML IJ SOLN
60.0000 mg | Freq: Once | INTRAMUSCULAR | Status: AC
  Administered 2019-12-03: 60 mg via INTRAVENOUS
  Filled 2019-12-03: qty 6

## 2019-12-03 NOTE — Progress Notes (Signed)
PROGRESS NOTE    RHYDER KOEGEL  SWN:[id number] DOB: 1986-02-28 DOA: 12/02/2019 PCP: Patient, No Pcp Per    Brief Narrative:  Patient was admitted to the hospital with a working diagnosis of volume overload due to acute kidney injury on chronic kidney disease, in the setting of uncontrolled hypertension, hypertensive urgency.   34 year old male with past medical history of type 2 diabetes mellitus, hypertension, obesity and chronic kidney disease.  Patient presents with 2 weeks of dyspnea, apparently he stopped taking his antihypertensive in January 2021 along with Metformin due to gastrointestinal side effects. On his initial physical examination his blood pressure was 186/109, heart rate 98, respiratory rate 21, temperature 99.1, oxygen saturation 89%, his lungs had scattered rales, heart S1-S2, present rhythm, soft abdomen, positive pitting lower extremity edema. Sodium 141, potassium 4.1, chloride 109, bicarb 24, glucose 132, BUN 33, creatinine 2.58, [place] count 8.6, hemoglobin 7.5, hematocrit 24.5, platelets 383.  SARS COVID-19 was negative.  His chest radiograph had bilateral interstitial infiltrates and bilateral hilar vascular congestion.  EKG 91 bpm, normal axis, normal intervals, sinus rhythm, J-point elevation V1 through V3, poor R wave progression, V6 T wave inversion.    Assessment & Plan:   Principal Problem:   AKI (acute kidney injury) ([place]) Active Problems:   Anemia   Essential hypertension   SOB (shortness of breath)   Type 2 diabetes mellitus without complication ([place])   1. AKI on suspected CKD (not clear stage). Patient with signs of volume overload. Will hold on IV fluids and will order 60 mg IV furosemide.   Will follow on renal panel in am, avoid hypotension and nephrotoxic medications.   2. Uncontrolled HTN/ hypertensive urgency. Will continue blood pressure control with amlodipine 10 mg daily, will add hydralazine 50 mg tid and will start patient on diuresis  with furosemide.   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 (acute on chronic diastolic heart  failure), continue blood pressure control.   3. T2DM. Capillary glucose in the lowe 100's, will hold on long acting insulin and will continue glucose control and monitoring with insulin sliding scale.   Patient is tolerating po well.   4. Symptomatic anemia, anemia of chronic renal disease associated with iron deficiency. Patient will get 2 units PRBC transfusion, will continue to follow up on Hgb and Hct.   Will plan for IV iron on this admission.    Status is: Inpatient  Remains inpatient appropriate because:IV treatments appropriate due to intensity of illness or inability to take PO   Dispo: The patient is from: Home              Anticipated d/c is to: Home              Anticipated d/c date is: 2 days              Patient currently is not medically stable to d/c.   DVT prophylaxis: scd  (patient declined heparin) Code Status:    full  Family Communication:  No family at the bedside      Subjective: Patient continue to have dyspnea, moderate in intensity, no chest pain, no nausea or vomiting.   Objective: Vitals:   12/03/19 1139 12/03/19 1200 12/03/19 1201 12/03/19 1307  BP: (!) 196/111 (!) 164/101  (!) 164/95  Pulse: 87 90  91  Resp: (!) 23 20    Temp: 98.4 F (36.9 C) 98.5 F (36.9 C)    TempSrc: Oral Oral  SpO2: 90% (!) 86% 97%   Weight:      Height:        Intake/Output Summary (Last 24 hours) at 12/03/2019 1529 Last data filed at 12/03/2019 0805 Gross per 24 hour  Intake 2114.48 ml  Output --  Net 2114.48 ml   Filed Weights   12/02/19 0627  Weight: 105.5 kg    Examination:   General: Not in pain. Deconditioned  Neurology: Awake and alert, non focal  E ENT: no pallor, no icterus, oral mucosa moist Cardiovascular: No JVD. S1-S2 present, rhythmic, no gallops, rubs, or murmurs. No lower extremity  edema. Pulmonary: positive breath sounds bilaterally, no wheezing, or rhonchi positive rales at bases. Gastrointestinal. Abdomen with no organomegaly, non tender, no rebound or guarding Skin. No rashes Musculoskeletal: no joint deformities     Data Reviewed: I have personally reviewed following labs and imaging studies  CBC: Recent Labs  Lab 12/02/19 0737 12/02/19 1800 12/03/19 0025  WBC 8.6  --   --   NEUTROABS 6.2  --   --   HGB 7.5* 7.5* 6.7*  HCT 24.5* 24.3* 21.9*  MCV 82.2  --   --   PLT 383  --   --    Basic Metabolic Panel: Recent Labs  Lab 12/02/19 0737  NA 141  K 4.1  CL 109  CO2 24  GLUCOSE 132*  BUN 33*  CREATININE 2.58*  CALCIUM 8.0*   GFR: Estimated Creatinine Clearance: 50.7 mL/min (A) (by C-G formula based on SCr of 2.58 mg/dL (H)). Liver Function Tests: No results for input(s): AST, ALT, ALKPHOS, BILITOT, PROT, ALBUMIN in the last 168 hours. No results for input(s): LIPASE, AMYLASE in the last 168 hours. No results for input(s): AMMONIA in the last 168 hours. Coagulation Profile: No results for input(s): INR, PROTIME in the last 168 hours. Cardiac Enzymes: No results for input(s): CKTOTAL, CKMB, CKMBINDEX, TROPONINI in the last 168 hours. BNP (last 3 results) No results for input(s): PROBNP in the last 8760 hours. HbA1C: Recent Labs    12/02/19 1311  HGBA1C 6.8*   CBG: Recent Labs  Lab 12/02/19 1633 12/02/19 2118 12/03/19 0720 12/03/19 1121  GLUCAP 113* 111* 109* 105*   Lipid Profile: Recent Labs    12/02/19 1311  CHOL 248*  HDL 39*  LDLCALC 187*  TRIG 108  CHOLHDL 6.4   Thyroid Function Tests: Recent Labs    12/02/19 1311  TSH 1.953   Anemia Panel: Recent Labs    12/02/19 1311  VITAMINB12 392  FOLATE 9.9  FERRITIN 196  TIBC 271  IRON 42*      Radiology Studies: I have reviewed all of the imaging during this hospital visit personally     Scheduled Meds: . sodium chloride   Intravenous Once  .  amLODipine  10 mg Oral Daily  . hydrALAZINE  50 mg Oral Q8H  . insulin aspart  0-20 Units Subcutaneous TID AC & HS  . sodium chloride flush  3 mL Intravenous Q12H   Continuous Infusions:   LOS: 1 day        Samera Macy Gerome Apley, MD

## 2019-12-03 NOTE — Progress Notes (Addendum)
Patient complained of sudden upper epigastric pain. Nausea was denied. Antiemetic offered, pt refused. Hospitalist paged to assist.

## 2019-12-03 NOTE — Progress Notes (Signed)
PROGRESS NOTE  KERNEY HOPFENSPERGER ZOX:[id number] DOB: 1986/07/10 DOA: 12/02/2019 PCP: Patient, No Pcp Per   LOS: 1 day   Brief Narrative / Interim history: [name] is a 34 y.o. A.A. male with PMH obesity, DMII, HTN who presents to the ER with worsening shortness of breath over the past 2 weeks.  He does endorse that he had COVID-19 in September 2020, he did not require hospitalization and recovered well at home.  He has not received vaccinations.  He did not develop any clots associated either.   Patient recently moved from [place] to [place] after losing his position with [employer] along with healthcare coverage. He has not established primary care in [place] yet. He is mainly endorsing an intermittent cough, shortness of breath, nausea with occasional vomiting in the mornings.  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  He has tried to manage his diabetes over the years (diagnosed 10 years ago).  He did not tolerate Metformin and stopped it in December 2020 due to GI symptoms.  He also stopped taking his blood pressure medication in January 2021, also due to associated diarrheal symptoms that he perceived.  He states he was on 3 medications but cannot recall the names.  Subjective / 24h Interval events: Pt sitting out of bed on morning rounds. Pt noted for breathing pauses while speaking.   Assessment & Plan: Principal Problem 1. Acute kidney injury - Per care everywhere, last available creatinine was at baseline on 7/18 without renal dysfunction. On 5/23, BUN 33, creatinine 2.58. Differential includes hypertensive nephropathy from uncontrolled hypertension vs. diabetic nephropathy from uncontrolled T2DM vs. a combination of the above. Renal ultrasound indicates an asymmetrically enlarged left kidney and diffusely increased echogenicity of the renal parenchyma likely secondary to medical renal disease. IVF initiated in ED and d/c on 5/24. Repeat BMP today not resulted yet.  Administer furosemide 60mg  IV. Avoid nephrotoxic agents, and address hypertension and diabetes as indicated.   Active Problems 2. Shortness of breath - ED O2 saturation 92%. Greater suspicion for SOB secondary to CHF (DG CXR indicative of pulmonary vascular congestion without frank interstitial edema; BNP 451.7; bilateral pedal edema) vs. severe anemia vs. PE (unable to order CTA due to AKI - LE duplex negative for DVT).  Administer Lasix 60 IV push. Supplement O2 if O2 saturation <93.  3. Anemia - Hemoglobin 7.5 on admission on 5/23 and 6.7 on 5/24, today. FOBT negative. Patient states he has been previously diagnosed with anemia by PCP in [place] and placed on iron pills. Patient has not been followed for "6 months" since unemployment and move from [place] to [place]. Iron 42, Ferritin 196, Folate 9.9, Vit B 392 and TIBC 271. Transfuse for Hgb <7.  4. Essential hypertension - BP uncontrolled since admission. Patient endorses PCP placing him on antihypertensives but patient stopped his medication in January/February 2021 due to adverse GI symptoms (along with his Metformin on 06/2019). Resume treatment with amplodipine and titrate as needed with labetalol and hydralizine as needed (ACEi/ARB/diuretics currently contraindicated due to poor renal function).   5. Type 2 diabetes mellitus - A1c on 5/23 is 6.8. Serum glucose 132. Hold long acting insuling due to AKI, continue sliding scale insulin. Carb modified diet.     Scheduled Meds: . amLODipine  10 mg Oral Daily  . heparin  5,000 Units Subcutaneous Q8H  . insulin aspart  0-20 Units Subcutaneous TID AC & HS  . insulin glargine  30 Units Subcutaneous Daily  .  sodium chloride flush  3 mL Intravenous Q12H   Continuous Infusions: . sodium chloride Stopped (12/03/19 0440)   PRN Meds:.acetaminophen, hydrALAZINE, labetalol, ondansetron (ZOFRAN) IV  DVT prophylaxis: Heparin SQ Code Status: Full code Family Communication: No family at  bedside.  Status is: Inpatient  Remains inpatient appropriate because:Inpatient level of care appropriate due to severity of illness   Dispo: The patient is from: Home              Anticipated d/c is to: Home              Anticipated d/c date is: 2 days              Patient currently is not medically stable to d/c.   Consultants:  None  Procedures:  2D echo: None Foley: None BiPAP: None HD: None  Microbiology  Covid negative.  Antimicrobials: None   Objective: Vitals:   12/03/19 0241 12/03/19 0441 12/03/19 0505 12/03/19 0804  BP: (!) 150/91 (!) 154/86 (!) 155/91 (!) 167/100  Pulse: 82 91 92 88  Resp:  18  20  Temp:  98.6 F (37 C) 98.3 F (36.8 C) 98.3 F (36.8 C)  TempSrc:  Oral Oral Oral  SpO2:  96% 96% 99%  Weight:      Height:        Intake/Output Summary (Last 24 hours) at 12/03/2019 1021 Last data filed at 12/03/2019 0805 Gross per 24 hour  Intake 2214.48 ml  Output --  Net 2214.48 ml   Filed Weights   12/02/19 0627  Weight: 105.5 kg    Examination:  Constitutional: NAD, but speech noted to be mildly limited by breathing requirement.  Eyes: no scleral icterus ENMT: Mucous membranes are moist.  Neck: normal, supple Respiratory: clear to auscultation bilaterally, no wheezing, no crackles. Normal respiratory effort. No accessory muscle use.  Cardiovascular: Regular rate and rhythm, no murmurs / rubs / gallops. Mild bilateral LE non-pitting edema. Good peripheral pulses Abdomen: non distended, no tenderness. Bowel sounds positive.  Musculoskeletal: no clubbing / cyanosis.  Skin: no rashes Neurologic: grossly non-focal. Psychiatric: Normal judgment and insight. Alert and oriented x 3. Normal mood.    Data Reviewed: I have independently reviewed following labs and imaging studies  CBC: Recent Labs  Lab 12/02/19 0737 12/02/19 1800 12/03/19 0025  WBC 8.6  --   --   NEUTROABS 6.2  --   --   HGB 7.5* 7.5* 6.7*  HCT 24.5* 24.3* 21.9*  MCV  82.2  --   --   PLT 383  --   --    Basic Metabolic Panel: Recent Labs  Lab 12/02/19 0737  NA 141  K 4.1  CL 109  CO2 24  GLUCOSE 132*  BUN 33*  CREATININE 2.58*  CALCIUM 8.0*   Liver Function Tests: No results for input(s): AST, ALT, ALKPHOS, BILITOT, PROT, ALBUMIN in the last 168 hours. Coagulation Profile: No results for input(s): INR, PROTIME in the last 168 hours. HbA1C: Recent Labs    12/02/19 1311  HGBA1C 6.8*   CBG: Recent Labs  Lab 12/02/19 1633 12/02/19 2118 12/03/19 0720  GLUCAP 113* 111* 109*    Recent Results (from the past 240 hour(s))  SARS Coronavirus 2 by RT PCR (hospital order, performed in [facility] hospital lab) Nasopharyngeal Nasopharyngeal Swab     Status: None   Collection Time: 12/02/19  9:53 AM   Specimen: Nasopharyngeal Swab  Result Value Ref Range Status   SARS Coronavirus 2 NEGATIVE  NEGATIVE Final    Comment: (NOTE) SARS-CoV-2 target nucleic acids are NOT DETECTED. The SARS-CoV-2 RNA is generally detectable in upper and lower respiratory specimens during the acute phase of infection. The lowest concentration of SARS-CoV-2 viral copies this assay can detect is 250 copies / mL. A negative result does not preclude SARS-CoV-2 infection and should not be used as the sole basis for treatment or other patient management decisions.  A negative result may occur with improper specimen collection / handling, submission of specimen other than nasopharyngeal swab, presence of viral mutation(s) within the areas targeted by this assay, and inadequate number of viral copies (<250 copies / mL). A negative result must be combined with clinical observations, patient history, and epidemiological information. Fact Sheet for Patients:   StrictlyIdeas.no Fact Sheet for Healthcare Providers: BankingDealers.co.za This test is not yet approved or cleared  by the [place] FDA and has been authorized for  detection and/or diagnosis of SARS-CoV-2 by FDA under an Emergency Use Authorization (EUA).  This EUA will remain in effect (meaning this test can be used) for the duration of the COVID-19 declaration under Section 564(b)(1) of the Act, 21 U.S.C. section 360bbb-3(b)(1), unless the authorization is terminated or revoked sooner. Performed at [facility], [place] [address], [place], [place] [address]      Radiology Studies: US RENAL  Result Date: 12/02/2019 CLINICAL DATA:  Acute renal insufficiency. EXAM: RENAL / URINARY TRACT ULTRASOUND COMPLETE COMPARISON:  None. FINDINGS: Right Kidney: Renal measurements: 12.3 cm x 7.6 cm x 5.3 cm = volume: 26.1 mL. There is diffusely increased echogenicity of the renal parenchyma. 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 No mass or hydronephrosis visualized. Left Kidney: Renal measurements: 16.3 cm x 8.0 cm x 6.9 cm = volume: 467.5 mL. There is diffusely increased echogenicity of the renal parenchyma. No mass or hydronephrosis visualized. Bladder: Appears normal for degree of bladder distention. Other: None. IMPRESSION: 1. Asymmetrically enlarged left kidney. 2. Diffusely increased echogenicity of the renal parenchyma likely secondary to medical renal disease. Electronically Signed   By: Virgina Norfolk M.D.   On: 12/02/2019 15:04

## 2019-12-03 NOTE — Progress Notes (Signed)
CRITICAL VALUE ALERT  Critical Value:  hgb 6.7  Date & Time Notied: 12/03/19 0052  Provider Notified: Kennon Holter, NP  Orders Received/Actions taken: Awaiting new orders.

## 2019-12-03 NOTE — TOC Initial Note (Signed)
Transition of Care [facility]) - Initial/Assessment Note    Patient Details  Name: [name] MRN: [id number] Date of Birth: 14-Aug-1985  Transition [facility]) CM/SW Contact:    Joaquin Courts, RN Phone Number: 12/03/2019, 3:23 PM  Clinical Narrative:   Patient has been set up with [facility] for pcp follow-up.  Information placed on AVS.                 Expected Discharge Plan: Home/Self Care Barriers to Discharge: Continued Medical Work up   Patient Goals and CMS Choice Patient states their goals for this hospitalization and ongoing recovery are:: to go home      Expected Discharge Plan and Services Expected Discharge Plan: Home/Self Care   Discharge Planning Services: CM Consult                     DME Arranged: N/A DME Agency: NA       HH Arranged: NA [place] Agency: NA        Prior Living Arrangements/Services     Patient language and need for interpreter reviewed:: Yes Do you feel safe going back to the place where you live?: Yes      Need for Family Participation in Patient Care: Yes (Comment) Care giver support system in place?: Yes (comment)   Criminal Activity/Legal Involvement Pertinent to Current Situation/Hospitalization: No - Comment as needed  Activities of Daily Living Home Assistive Devices/Equipment: None ADL Screening (condition at time of admission) Patient's cognitive ability adequate to safely complete daily activities?: Yes Is the patient deaf or have difficulty hearing?: No Does the patient have difficulty seeing, even when wearing glasses/contacts?: No Does the patient have difficulty concentrating, remembering, or making decisions?: No Patient able to express need for assistance with ADLs?: Yes Does the patient have difficulty dressing or bathing?: No Independently performs ADLs?: Yes (appropriate for developmental age) Does the patient have difficulty walking or climbing stairs?: No Weakness of Legs: None Weakness of Arms/Hands:  None  Permission Sought/Granted                  Emotional Assessment           Psych Involvement: No (comment)  Admission diagnosis:  Peripheral edema [R60.9] Hypoxia [R09.02] AKI (acute kidney injury) ([place]) [N17.9] Symptomatic anemia [D64.9] Patient Active Problem List   Diagnosis Date Noted  . AKI (acute kidney injury) ([place]) 12/02/2019  . Anemia 12/02/2019  . Essential hypertension 12/02/2019  . SOB (shortness of breath) 12/02/2019  . Type 2 diabetes mellitus without complication ([place]) 49/67/5916   PCP:  Patient, No Pcp Per Pharmacy:   [facility] [place] [address] [place] ([place]) [place] [address] Phone: [phone] Fax: [phone]  [facility] [address] [place] [address] Phone: [phone] Fax: [place], [place] [address] [place] [address] Phone: [phone] Fax: [phone]  [id number] - [place], [place] [address] [place] [address] Phone: [phone] Fax: [phone]     Social Determinants of Health (SDOH) Interventions    Readmission Risk Interventions No flowsheet data found.

## 2019-12-04 ENCOUNTER — Inpatient Hospital Stay (HOSPITAL_COMMUNITY): Payer: BC Managed Care – PPO

## 2019-12-04 DIAGNOSIS — R0602 Shortness of breath: Secondary | ICD-10-CM

## 2019-12-04 LAB — TYPE AND SCREEN
ABO/RH(D): O POS
Antibody Screen: NEGATIVE
Unit division: 0
Unit division: 0

## 2019-12-04 LAB — BPAM RBC
Blood Product Expiration Date: 202106222359
Blood Product Expiration Date: 202106222359
ISSUE DATE / TIME: 202105240445
ISSUE DATE / TIME: 202105241131
Unit Type and Rh: 5100
Unit Type and Rh: 5100

## 2019-12-04 LAB — GLUCOSE, CAPILLARY
Glucose-Capillary: 79 mg/dL (ref 70–99)
Glucose-Capillary: 95 mg/dL (ref 70–99)
Glucose-Capillary: 110 mg/dL — ABNORMAL HIGH (ref 70–99)
Glucose-Capillary: 103 mg/dL — ABNORMAL HIGH (ref 70–99)

## 2019-12-04 LAB — CBC WITH DIFFERENTIAL/PLATELET
Abs Immature Granulocytes: 0.02 10*3/uL (ref 0.00–0.07)
Basophils Absolute: 0 10*3/uL (ref 0.0–0.1)
Basophils Relative: 0 %
Eosinophils Absolute: 0.2 10*3/uL (ref 0.0–0.5)
Eosinophils Relative: 2 %
HCT: 29.4 % — ABNORMAL LOW (ref 39.0–52.0)
Hemoglobin: 9 g/dL — ABNORMAL LOW (ref 13.0–17.0)
Immature Granulocytes: 0 %
Lymphocytes Relative: 18 %
Lymphs Abs: 2 10*3/uL (ref 0.7–4.0)
MCH: 25.4 pg — ABNORMAL LOW (ref 26.0–34.0)
MCHC: 30.6 g/dL (ref 30.0–36.0)
MCV: 83.1 fL (ref 80.0–100.0)
Monocytes Absolute: 0.8 10*3/uL (ref 0.1–1.0)
Monocytes Relative: 7 %
Neutro Abs: 7.9 10*3/uL — ABNORMAL HIGH (ref 1.7–7.7)
Neutrophils Relative %: 73 %
Platelets: 359 10*3/uL (ref 150–400)
RBC: 3.54 MIL/uL — ABNORMAL LOW (ref 4.22–5.81)
RDW: 15.4 % (ref 11.5–15.5)
WBC: 11 10*3/uL — ABNORMAL HIGH (ref 4.0–10.5)
nRBC: 0 % (ref 0.0–0.2)

## 2019-12-04 LAB — BASIC METABOLIC PANEL
Anion gap: 4 — ABNORMAL LOW (ref 5–15)
BUN: 29 mg/dL — ABNORMAL HIGH (ref 6–20)
CO2: 28 mmol/L (ref 22–32)
Calcium: 8 mg/dL — ABNORMAL LOW (ref 8.9–10.3)
Chloride: 108 mmol/L (ref 98–111)
Creatinine, Ser: 2.72 mg/dL — ABNORMAL HIGH (ref 0.61–1.24)
GFR calc Af Amer: 34 mL/min — ABNORMAL LOW (ref >60)
GFR calc non Af Amer: 29 mL/min — ABNORMAL LOW (ref >60)
Glucose, Bld: 116 mg/dL — ABNORMAL HIGH (ref 70–99)
Potassium: 3.5 mmol/L (ref 3.5–5.1)
Sodium: 140 mmol/L (ref 135–145)

## 2019-12-04 LAB — ECHOCARDIOGRAM COMPLETE
Height: 72 in
Weight: 3721.6 oz

## 2019-12-04 MED ORDER — FERROUS SULFATE 325 (65 FE) MG PO TABS
325.0000 mg | ORAL_TABLET | Freq: Every day | ORAL | Status: DC
  Administered 2019-12-05: 325 mg via ORAL
  Filled 2019-12-04: qty 1

## 2019-12-04 NOTE — Progress Notes (Signed)
PROGRESS NOTE    [name]  AST:[id number] DOB: 05/05/1986 DOA: 12/02/2019 PCP: Patient, No Pcp Per    Brief Narrative:  Patient was admitted to the hospital with a working diagnosis of volume overload due to acute kidney injury on chronic kidney disease, in the setting of uncontrolled hypertension, hypertensive urgency.   34 year old male with past medical history of type 2 diabetes mellitus, hypertension, obesity and chronic kidney disease.  Patient presents with 2 weeks of dyspnea, apparently he stopped taking his antihypertensive in January 2021 along with Metformin due to gastrointestinal side effects. On his initial physical examination his blood pressure was 186/109, heart rate 98, respiratory rate 21, temperature 99.1, oxygen saturation 89%, his lungs had scattered rales, heart S1-S2, present rhythm, soft abdomen, positive pitting lower extremity edema. Sodium 141, potassium 4.1, chloride 109, bicarb 24, glucose 132, BUN 33, creatinine 2.58, [place] count 8.6, hemoglobin 7.5, hematocrit 24.5, platelets 383.  SARS COVID-19 was negative.  His chest radiograph had bilateral interstitial infiltrates and bilateral hilar vascular congestion.  EKG 91 bpm, normal axis, normal intervals, sinus rhythm, J-point elevation V1 through V3, poor R wave progression, V6 T wave inversion.  Patient's blood pressure has improved, continue to have elevated serum cr. Improvement in dyspnea and edema.   If continue to be stable renal function in am, plan for dc home with close follow up.    Assessment & Plan:   Principal Problem:   AKI (acute kidney injury) ([place]) Active Problems:   Anemia   Essential hypertension   SOB (shortness of breath)   Type 2 diabetes mellitus without complication ([place])   1. AKI on suspected CKD (not clear stage). Improved volume status, decreased dyspnea and edema. Renal function with serum cr at 2.72 with K at 3,5 and serum bicarbonate at 28. Patient tolerating po well,  with no nausea or vomiting. Documented urine output over last 24 H 500 cc.   I suspect patient has CKD, possible base cr 2,5 to 2,7, consistent with CKD stage 3b. Will follow on renal function in am, if continue to be stable will plan for dc home with close follow up. Will check P in am.   2. Uncontrolled HTN/ hypertensive urgency. Blood pressure improved down to 154/85 this am.  Echocardiogram with preserved LV systolic function and moderate left ventricle hypertrophy.   Will continue blood pressure control with amlodipine 10 mg daily and TID hydralazine 50 mg. Will hold on furosemide for now.  3. T2DM. Fastin glucose this am 116, will continue with insulin sliding scale for glucose cover and monitoring.   Patient is tolerating po well.   4. Symptomatic anemia, anemia of chronic renal disease associated with iron deficiency. SP 2 units PRBC transfusion. Hgb is 9.0 and Hct at 29.4  Iron panel with serum iron 42, TIBC 271, Ferritin 196 and transferrin saturation 15.   Will add oral iron supplementation, and follow up as outpatient. If no improvement may need IV iron.   Status is: Inpatient  Remains inpatient appropriate because:Ongoing active pain requiring inpatient pain management and Ongoing diagnostic testing needed not appropriate for outpatient work up, patient need close monitoring of renal function before discharge.    Dispo: The patient is from: Home              Anticipated d/c is to: Home              Anticipated d/c date is: 1 day  Patient currently is not medically stable to d/c.   DVT prophylaxis: scd   Code Status:   full  Family Communication:  No family at the bedside      Subjective: Patient is feeling better with improved dyspnea and lower extremity edema, but not yet back to baseline, no nausea or vomiting and tolerating po well.   Objective: Vitals:   12/03/19 2028 12/04/19 0510 12/04/19 1010 12/04/19 1306  BP: (!) 167/96 (!) 145/83 (!)  159/94 (!) 154/85  Pulse: 93 91 93 88  Resp: 18 17 18 18   Temp: 99.5 F (37.5 C) 99.2 F (37.3 C) 98.4 F (36.9 C) 98.5 F (36.9 C)  TempSrc: Oral Oral Oral Oral  SpO2: 98% 93% 91% 93%  Weight:      Height:        Intake/Output Summary (Last 24 hours) at 12/04/2019 1431 Last data filed at 12/04/2019 1344 Gross per 24 hour  Intake 950 ml  Output 1575 ml  Net -625 ml   Filed Weights   12/02/19 6063  Weight: 105.5 kg    Examination:   General: Not in pain or dyspnea, deconditioned  Neurology: Awake and alert, non focal  E ENT: mild pallor, no icterus, oral mucosa moist Cardiovascular: No JVD. S1-S2 present, rhythmic, no gallops, rubs, or murmurs. tarce lower extremity edema more at the left foot.  Pulmonary: positive breath sounds bilaterally, adequate air movement, no wheezing, rhonchi or rales. Gastrointestinal. Abdomen soft , no organomegaly, non tender, no rebound or guarding Skin. No rashes Musculoskeletal: no joint deformities     Data Reviewed: I have personally reviewed following labs and imaging studies  CBC: Recent Labs  Lab 12/02/19 0737 12/02/19 1800 12/03/19 0025 12/03/19 2006 12/04/19 0428  WBC 8.6  --   --  13.9* 11.0*  NEUTROABS 6.2  --   --   --  7.9*  HGB 7.5* 7.5* 6.7* 9.2* 9.0*  HCT 24.5* 24.3* 21.9* 29.5* 29.4*  MCV 82.2  --   --  81.7 83.1  PLT 383  --   --  355 016   Basic Metabolic Panel: Recent Labs  Lab 12/02/19 0737 12/03/19 1631 12/04/19 0428  NA 141 142 140  K 4.1 3.6 3.5  CL 109 107 108  CO2 24 26 28   GLUCOSE 132* 106* 116*  BUN 33* 29* 29*  CREATININE 2.58* 2.53* 2.72*  CALCIUM 8.0* 8.3* 8.0*   GFR: Estimated Creatinine Clearance: 48.1 mL/min (A) (by C-G formula based on SCr of 2.72 mg/dL (H)). Liver Function Tests: No results for input(s): AST, ALT, ALKPHOS, BILITOT, PROT, ALBUMIN in the last 168 hours. No results for input(s): LIPASE, AMYLASE in the last 168 hours. No results for input(s): AMMONIA in the last 168  hours. Coagulation Profile: No results for input(s): INR, PROTIME in the last 168 hours. Cardiac Enzymes: No results for input(s): CKTOTAL, CKMB, CKMBINDEX, TROPONINI in the last 168 hours. BNP (last 3 results) No results for input(s): PROBNP in the last 8760 hours. HbA1C: Recent Labs    12/02/19 1311  HGBA1C 6.8*   CBG: Recent Labs  Lab 12/03/19 1121 12/03/19 1611 12/03/19 2110 12/04/19 0735 12/04/19 1114  GLUCAP 105* 103* 91 79 95   Lipid Profile: Recent Labs    12/02/19 1311  CHOL 248*  HDL 39*  LDLCALC 187*  TRIG 108  CHOLHDL 6.4   Thyroid Function Tests: Recent Labs    12/02/19 1311  TSH 1.953   Anemia Panel: Recent Labs    12/02/19  1311  VITAMINB12 392  FOLATE 9.9  FERRITIN 196  TIBC 271  IRON 42*      Radiology Studies: I have reviewed all of the imaging during this hospital visit personally     Scheduled Meds: . sodium chloride   Intravenous Once  . amLODipine  10 mg Oral Daily  . hydrALAZINE  50 mg Oral Q8H  . insulin aspart  0-20 Units Subcutaneous TID AC & HS  . pantoprazole  40 mg Oral Daily  . sodium chloride flush  3 mL Intravenous Q12H   Continuous Infusions:   LOS: 2 days        Elanah Osmanovic Gerome Apley, MD

## 2019-12-04 NOTE — Progress Notes (Signed)
  Echocardiogram 2D Echocardiogram has been performed.  [name] 12/04/2019, 8:43 AM

## 2019-12-05 DIAGNOSIS — R0602 Shortness of breath: Secondary | ICD-10-CM

## 2019-12-05 LAB — GLUCOSE, CAPILLARY
Glucose-Capillary: 96 mg/dL (ref 70–99)
Glucose-Capillary: 110 mg/dL — ABNORMAL HIGH (ref 70–99)

## 2019-12-05 LAB — BASIC METABOLIC PANEL
Anion gap: 6 (ref 5–15)
BUN: 32 mg/dL — ABNORMAL HIGH (ref 6–20)
CO2: 26 mmol/L (ref 22–32)
Calcium: 7.9 mg/dL — ABNORMAL LOW (ref 8.9–10.3)
Chloride: 105 mmol/L (ref 98–111)
Creatinine, Ser: 2.73 mg/dL — ABNORMAL HIGH (ref 0.61–1.24)
GFR calc Af Amer: 34 mL/min — ABNORMAL LOW (ref >60)
GFR calc non Af Amer: 29 mL/min — ABNORMAL LOW (ref >60)
Glucose, Bld: 92 mg/dL (ref 70–99)
Potassium: 3.7 mmol/L (ref 3.5–5.1)
Sodium: 137 mmol/L (ref 135–145)

## 2019-12-05 MED ORDER — AMLODIPINE BESYLATE 10 MG PO TABS: 10.0000 mg | ORAL_TABLET | Freq: Every day | ORAL | 0 refills | Status: DC

## 2019-12-05 MED ORDER — HYDRALAZINE HCL 50 MG PO TABS: 50.0000 mg | ORAL_TABLET | Freq: Three times a day (TID) | ORAL | 0 refills | Status: DC

## 2019-12-05 MED ORDER — FERROUS SULFATE 325 (65 FE) MG PO TABS: 325.0000 mg | ORAL_TABLET | Freq: Every day | ORAL | 0 refills | Status: DC

## 2019-12-05 MED FILL — hydrALAZINE HCL 50 MG TABS: 50 | 30 days supply | Qty: 90 | Fill #0

## 2019-12-05 MED FILL — AMLODIPINE BESYLATE 10 MG T: 10 | 30 days supply | Qty: 30 | Fill #0

## 2019-12-05 NOTE — Progress Notes (Signed)
No change from am assessment. Pt is A&Ox4, ambulatory without assistance. Discharge instructions reviewed. Questions, concerns denied at this time. Pt encouraged to keep follow up visit and pick up medications from the [facility].

## 2019-12-05 NOTE — Discharge Summary (Signed)
Physician Discharge Summary  PRESS CASALE XIH:[id number] DOB: Dec 18, 1985 DOA: 12/02/2019  PCP: Patient, No Pcp Per  Admit date: 12/02/2019 Discharge date: 12/05/2019  Admitted From: Home Disposition: Home   Recommendations for Outpatient Follow-up:  1. Follow up with PCP to establish care as scheduled prior to discharge.  2. Please obtain BMP and CBC at follow up. Recommend addition/change to ACE or ARB given HTN and DM, though with prolonged time to follow up and stage III CKD, these are not prescribed anew at discharge.  3. continue T2DM management.  Home Health: None Equipment/Devices: None Discharge Condition: Stable CODE STATUS: Full Diet recommendation: Heart healthy, carb-modfied  Brief/Interim Summary: Patient was admitted to the hospital with a working diagnosis of volume overload due to acute kidney injury on chronic kidney disease,in the setting of uncontrolled hypertension, hypertensiveurgency.  34 year old male with past medical history of type 2 diabetes mellitus, hypertension, obesity and chronic kidney disease. Patient presents with 2 weeks of dyspnea, apparently he stopped taking his antihypertensive in January 2021 along with Metformin due to gastrointestinal side effects. On his initial physical examination his blood pressure was 186/109, heart rate 98, respiratory rate 21, temperature 99.1, oxygen saturation 89%, his lungs had scattered rales, heart S1-S2, present rhythm, soft abdomen, positivepittinglower extremity edema. Sodium 141, potassium 4.1, chloride 109, bicarb 24, glucose 132, BUN 33, creatinine 2.58, Dykman count 8.6, hemoglobin 7.5, hematocrit 24.5, platelets 383. SARS COVID-19 was negative. His chest radiograph had bilateral interstitial infiltrates and bilateral hilar vascular congestion. EKG 91 bpm, normal axis, normal intervals, sinus rhythm, J-point elevation V1 through V3, poor R wave progression,V6 T wave inversion.  Patient's blood  pressure has improved, continue to have elevated serum cr. Improvement in dyspnea and edema.   He did continue to be stable with unchanged renal function and was discharged on 12/05/2019.  Discharge Diagnoses:  Principal Problem:   AKI (acute kidney injury) ([place]) Active Problems:   Anemia   Essential hypertension   SOB (shortness of breath)   Type 2 diabetes mellitus without complication ([place])  AKI on suspected CKD stage IIIa: This is based on available creatinine values. Improved volume status, decreased dyspnea and edema. Renal function stable on day of discharge. CrCl is ~47 at time of discharge. Avoid nephrotoxins.  Uncontrolled HTN/ hypertensive urgency: Improved with norvasc and hydralazine.  - Recommend adding/substituting ACE or ARB per PCP (given need for lab monitoring which is not available until establishing with PCP).   T2DM: Continue home medications and PCP follow up.  Symptomatic anemia, anemia of chronic renal disease associated with iron deficiency. SP 2 units PRBC transfusion. Hgb is 9.0. Iron panel with serum iron 42, TIBC 271, Ferritin 196 and transferrin saturation 15.  - Continue iron and PCP follow up.  Discharge Instructions Discharge Instructions    Diet - low sodium heart healthy   Complete by: As directed    Discharge instructions   Complete by: As directed    Continue taking norvasc and hydralazine to treat high blood pressure and medications as you were taking for diabetes. Follow up if very important in managing these conditions to prevent progression of chronic kidney disease. A follow up appointment has been scheduled and [facility] on Jun 17. You can also go there to fill the prescriptions. You should also take iron supplement daily to help rebuild your blood counts. If your symptoms return, seek medical attention right away.   Increase activity slowly   Complete by: As directed  Allergies as of 12/05/2019   No Known  Allergies     Medication List    STOP taking these medications   glyBURIDE 5 MG tablet Commonly known as: DIABETA   insulin glargine 100 UNIT/ML injection Commonly known as: LANTUS   metaxalone 800 MG tablet Commonly known as: SKELAXIN   metFORMIN 1000 MG tablet Commonly known as: GLUCOPHAGE   naproxen 500 MG tablet Commonly known as: NAPROSYN   oxyCODONE-acetaminophen 5-325 MG tablet Commonly known as: Percocet   sulfamethoxazole-trimethoprim 800-160 MG tablet Commonly known as: Bactrim DS     TAKE these medications   amLODipine 10 MG tablet Commonly known as: NORVASC Take 1 tablet (10 mg total) by mouth daily.   ferrous sulfate 325 (65 FE) MG tablet Take 1 tablet (325 mg total) by mouth daily with breakfast.   hydrALAZINE 50 MG tablet Commonly known as: APRESOLINE Take 1 tablet (50 mg total) by mouth every 8 (eight) hours.   insulin detemir 100 UNIT/ML injection Commonly known as: LEVEMIR Inject 30 Units into the skin daily.      Follow-up [facility] on 12/27/2019.   Why: you have an appointment on June 17 at 9 am to establish primary care services. Contact information: [place] [phone]         No Known Allergies  Consultations:  None  Procedures/Studies: DG Chest 2 View  Result Date: 12/02/2019 CLINICAL DATA:  Shortness of breath EXAM: CHEST - 2 VIEW COMPARISON:  None. FINDINGS: Mild bibasilar opacities, favoring atelectasis. Pulmonary vascular congestion without frank interstitial edema. No pleural effusion or pneumothorax. The heart is normal in size. Visualized osseous structures are within normal limits. IMPRESSION: Pulmonary vascular congestion without frank interstitial edema. Mild bibasilar opacities favor atelectasis. Electronically Signed   By: Julian Hy M.D.   On: 12/02/2019 07:39   US RENAL  Result Date:  12/02/2019 CLINICAL DATA:  Acute renal insufficiency. EXAM: RENAL / URINARY TRACT ULTRASOUND COMPLETE COMPARISON:  None. FINDINGS: Right Kidney: Renal measurements: 12.3 cm x 7.6 cm x 5.3 cm = volume: 26.1 mL. There is diffusely increased echogenicity of the renal parenchyma. 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 No mass or hydronephrosis visualized. Left Kidney: Renal measurements: 16.3 cm x 8.0 cm x 6.9 cm = volume: 467.5 mL. There is diffusely increased echogenicity of the renal parenchyma. No mass or hydronephrosis visualized. Bladder: Appears normal for degree of bladder distention. Other: None. IMPRESSION: 1. Asymmetrically enlarged left kidney. 2. Diffusely increased echogenicity of the renal parenchyma likely secondary to medical renal disease. Electronically Signed   By: Virgina Norfolk M.D.   On: 12/02/2019 15:04   ECHOCARDIOGRAM COMPLETE  Result Date: 12/04/2019    ECHOCARDIOGRAM REPORT   Patient Name:   LISTER BRIZZI Ing Date of Exam: 12/04/2019 Medical Rec #:  [id number]       Height:       72.0 in Accession #:    [id number]      Weight:       232.6 lb Date of Birth:  1986-06-01       BSA:          2.272 m Patient Age:    33 years        BP:           145/83 mmHg Patient Gender: M               HR:  91 bpm. Exam Location:  Inpatient Procedure: 2D Echo, Cardiac Doppler and Color Doppler Indications:     Dyspnea  History:         Patient has no prior history of Echocardiogram examinations.                  Signs/Symptoms:Shortness of Breath; Risk Factors:Hypertension                  and Diabetes. Covid, Anemia.  Sonographer:     Dustin Flock Referring Phys:  [id number] [place] Diagnosing Phys: Loralie Champagne MD IMPRESSIONS  1. Left ventricular ejection fraction, by estimation, is 55 to 60%. The left ventricle has normal function. The left ventricle has no regional wall motion abnormalities. There is moderate left ventricular hypertrophy. Left ventricular  diastolic parameters were normal.  2. Right ventricular systolic function is normal. The right ventricular size is normal. There is moderately elevated pulmonary artery systolic pressure. The estimated right ventricular systolic pressure is 28.4 mmHg.  3. Left atrial size was mildly dilated.  4. The mitral valve is normal in structure. Trivial mitral valve regurgitation. No evidence of mitral stenosis.  5. The aortic valve is tricuspid. Aortic valve regurgitation is not visualized. No aortic stenosis is present.  6. The inferior vena cava is normal in size with <50% respiratory variability, suggesting right atrial pressure of 8 mmHg. FINDINGS  Left Ventricle: Left ventricular ejection fraction, by estimation, is 55 to 60%. The left ventricle has normal function. The left ventricle has no regional wall motion abnormalities. The left ventricular internal cavity size was normal in size. There is  moderate left ventricular hypertrophy. Left ventricular diastolic parameters were normal. Right Ventricle: The right ventricular size is normal. No increase in right ventricular wall thickness. Right ventricular systolic function is normal. There is moderately elevated pulmonary artery systolic pressure. The tricuspid regurgitant velocity is 3.07 m/s, and with an assumed right atrial pressure of 8 mmHg, the estimated right ventricular systolic pressure is 13.2 mmHg. Left Atrium: Left atrial size was mildly dilated. Right Atrium: Right atrial size was normal in size. Pericardium: A small pericardial effusion is present. There is no evidence of cardiac tamponade. Mitral Valve: The mitral valve is normal in structure. Trivial mitral valve regurgitation. No evidence of mitral valve stenosis. Tricuspid Valve: The tricuspid valve is normal in structure. Tricuspid valve regurgitation is trivial. Aortic Valve: The aortic valve is tricuspid. Aortic valve regurgitation is not visualized. No aortic stenosis is present. Pulmonic Valve:  The pulmonic valve was normal in structure. Pulmonic valve regurgitation is not visualized. Aorta: The aortic root is normal in size and structure. Venous: The inferior vena cava is normal in size with less than 50% respiratory variability, suggesting right atrial pressure of 8 mmHg. IAS/Shunts: No atrial level shunt detected by color flow Doppler.  LEFT VENTRICLE PLAX 2D LVIDd:         5.00 cm  Diastology LVIDs:         3.80 cm  LV e' lateral:   12.20 cm/s LV PW:         1.60 cm  LV E/e' lateral: 10.2 LV IVS:        1.50 cm  LV e' medial:    9.03 cm/s LVOT diam:     2.50 cm  LV E/e' medial:  13.7 LV SV:         103 LV SV Index:   45 LVOT Area:     4.91 cm  RIGHT  VENTRICLE RV Basal diam:  3.10 cm RV S prime:     11.20 cm/s TAPSE (M-mode): 2.8 cm LEFT ATRIUM             Index       RIGHT ATRIUM           Index LA diam:        3.90 cm 1.72 cm/m  RA Area:     17.10 cm LA Vol (A2C):   88.1 ml 38.78 ml/m RA Volume:   52.10 ml  22.93 ml/m LA Vol (A4C):   88.8 ml 39.09 ml/m LA Biplane Vol: 93.6 ml 41.20 ml/m  AORTIC VALVE LVOT Vmax:   110.00 cm/s LVOT Vmean:  76.600 cm/s LVOT VTI:    0.210 m  AORTA Ao Root diam: 3.20 cm MITRAL VALVE                TRICUSPID VALVE MV Area (PHT): 4.21 cm     TR Peak grad:   37.7 mmHg MV Decel Time: 180 msec     TR Vmax:        307.00 cm/s MV E velocity: 124.00 cm/s                             SHUNTS                             Systemic VTI:  0.21 m                             Systemic Diam: 2.50 cm Loralie Champagne MD Electronically signed by Loralie Champagne MD Signature Date/Time: 12/04/2019/10:00:18 AM    Final (Updated)    VAS [place] LOWER EXTREMITY VENOUS (DVT) (ONLY [facility] & [facility] 7a-7p)  Result Date: 12/03/2019  Lower Venous DVTStudy Indications: Swelling, and SOB.  Comparison Study: No prior study Performing Technologist: Maudry Mayhew MHA, RDMS, RVT, RDCS  Examination Guidelines: A complete evaluation includes B-mode imaging, spectral Doppler, color Doppler, and power Doppler as  needed of all accessible portions of each vessel. Bilateral testing is considered an integral part of a complete examination. Limited examinations for reoccurring indications may be performed as noted. The reflux portion of the exam is performed with the patient in reverse Trendelenburg.  +-----+---------------+---------+-----------+----------+--------------+ RIGHTCompressibilityPhasicitySpontaneityPropertiesThrombus Aging +-----+---------------+---------+-----------+----------+--------------+ CFV  Full           Yes      Yes                                 +-----+---------------+---------+-----------+----------+--------------+   +---------+---------------+---------+-----------+----------+--------------+ LEFT     CompressibilityPhasicitySpontaneityPropertiesThrombus Aging +---------+---------------+---------+-----------+----------+--------------+ CFV      Full           Yes      Yes                                 +---------+---------------+---------+-----------+----------+--------------+ SFJ      Full                                                        +---------+---------------+---------+-----------+----------+--------------+  FV Prox  Full                                                        +---------+---------------+---------+-----------+----------+--------------+ FV Mid   Full                                                        +---------+---------------+---------+-----------+----------+--------------+ FV DistalFull                                                        +---------+---------------+---------+-----------+----------+--------------+ PFV      Full                                                        +---------+---------------+---------+-----------+----------+--------------+ POP      Full           Yes      Yes                                 +---------+---------------+---------+-----------+----------+--------------+ PTV       Full                                                        +---------+---------------+---------+-----------+----------+--------------+ PERO     Full                                                        +---------+---------------+---------+-----------+----------+--------------+     Summary: RIGHT: - No evidence of common femoral vein obstruction.  LEFT: - There is no evidence of deep vein thrombosis in the lower extremity.  - No cystic structure found in the popliteal fossa.  *See table(s) above for measurements and observations. Electronically signed by Servando Snare MD on 12/03/2019 at 12:52:48 AM.    Final        Subjective: Feels better, swelling in legs significantly improved. No chest pain or dyspnea. No HA, vision changes. Tolerating diet.   Discharge Exam: Vitals:   12/05/19 0609 12/05/19 1036  BP: (!) 151/86 (!) 164/95  Pulse: 84 93  Resp: 16 16  Temp: 98.3 F (36.8 C) 98.4 F (36.9 C)  SpO2: 96% 98%   General: Pt is alert, awake, not in acute distress Cardiovascular: RRR, S1/S2 +, no rubs, no gallops Respiratory: CTA bilaterally, no wheezing, no rhonchi Abdominal: Soft, NT, ND, bowel sounds + Extremities: Trace nonpitting LE edema, no cyanosis  Labs: BNP (last 3 results) Recent Labs  12/02/19 0744  BNP 350.0*   Basic Metabolic Panel: Recent Labs  Lab 12/02/19 0737 12/03/19 1631 12/04/19 0428 12/05/19 0352  NA 141 142 140 137  K 4.1 3.6 3.5 3.7  CL 109 107 108 105  CO2 24 26 28 26   GLUCOSE 132* 106* 116* 92  BUN 33* 29* 29* 32*  CREATININE 2.58* 2.53* 2.72* 2.73*  CALCIUM 8.0* 8.3* 8.0* 7.9*   Liver Function Tests: No results for input(s): AST, ALT, ALKPHOS, BILITOT, PROT, ALBUMIN in the last 168 hours. No results for input(s): LIPASE, AMYLASE in the last 168 hours. No results for input(s): AMMONIA in the last 168 hours. CBC: Recent Labs  Lab 12/02/19 0737 12/02/19 1800 12/03/19 0025 12/03/19 2006 12/04/19 0428  WBC 8.6  --   --   13.9* 11.0*  NEUTROABS 6.2  --   --   --  7.9*  HGB 7.5* 7.5* 6.7* 9.2* 9.0*  HCT 24.5* 24.3* 21.9* 29.5* 29.4*  MCV 82.2  --   --  81.7 83.1  PLT 383  --   --  355 359   Cardiac Enzymes: No results for input(s): CKTOTAL, CKMB, CKMBINDEX, TROPONINI in the last 168 hours. BNP: Invalid input(s): POCBNP CBG: Recent Labs  Lab 12/04/19 0735 12/04/19 1114 12/04/19 1615 12/04/19 2108 12/05/19 0703  GLUCAP 79 95 110* 103* 96   D-Dimer No results for input(s): DDIMER in the last 72 hours. Hgb A1c Recent Labs    12/02/19 1311  HGBA1C 6.8*   Lipid Profile Recent Labs    12/02/19 1311  CHOL 248*  HDL 39*  LDLCALC 187*  TRIG 108  CHOLHDL 6.4   Thyroid function studies Recent Labs    12/02/19 1311  TSH 1.953   Anemia work up Recent Labs    12/02/19 1311  VITAMINB12 392  FOLATE 9.9  FERRITIN 196  TIBC 271  IRON 42*   Urinalysis    Component Value Date/Time   COLORURINE YELLOW 07/16/2014 [place] 07/16/2014 1747   LABSPEC 1.010 07/16/2014 1747   PHURINE 6.5 07/16/2014 1747   GLUCOSEU >1000 (A) 07/16/2014 1747   HGBUR NEGATIVE 07/16/2014 1747   BILIRUBINUR NEGATIVE 07/16/2014 1747   KETONESUR NEGATIVE 07/16/2014 1747   PROTEINUR NEGATIVE 07/16/2014 1747   UROBILINOGEN 0.2 07/16/2014 1747   NITRITE NEGATIVE 07/16/2014 1747   LEUKOCYTESUR NEGATIVE 07/16/2014 1747    Microbiology Recent Results (from the past 240 hour(s))  SARS Coronavirus 2 by RT PCR (hospital order, performed in [place] hospital lab) Nasopharyngeal Nasopharyngeal Swab     Status: None   Collection Time: 12/02/19  9:53 AM   Specimen: Nasopharyngeal Swab  Result Value Ref Range Status   SARS Coronavirus 2 NEGATIVE NEGATIVE Final    Comment: (NOTE) SARS-CoV-2 target nucleic acids are NOT DETECTED. The SARS-CoV-2 RNA is generally detectable in upper and lower respiratory specimens during the acute phase of infection. The lowest concentration of SARS-CoV-2 viral copies  this assay can detect is 250 copies / mL. A negative result does not preclude SARS-CoV-2 infection and should not be used as the sole basis for treatment or other patient management decisions.  A negative result may occur with improper specimen collection / handling, submission of specimen other than nasopharyngeal swab, presence of viral mutation(s) within the areas targeted by this assay, and inadequate number of viral copies (<250 copies / mL). A negative result must be combined with clinical observations, patient history, and epidemiological information. Fact Sheet for Patients:   StrictlyIdeas.no Fact  Sheet for Healthcare Providers: BankingDealers.co.za This test is not yet approved or cleared  by the [place] and has been authorized for detection and/or diagnosis of SARS-CoV-2 by FDA under an Emergency Use Authorization (EUA).  This EUA will remain in effect (meaning this test can be used) for the duration of the COVID-19 declaration under Section 564(b)(1) of the Act, 21 U.S.C. section 360bbb-3(b)(1), unless the authorization is terminated or revoked sooner. Performed at [facility], [place] [address], [place], [place] [address]     Time coordinating discharge: Approximately 40 minutes  Patrecia Pour, MD  [facility] 12/05/2019, 10:38 AM

## 2019-12-05 NOTE — TOC Progression Note (Signed)
Transition of Care [facility]) - Progression Note    Patient Details  Name: [name] MRN: [id number] Date of Birth: [birth year]  Transition of Care [facility]) CM/SW Contact  Purcell Mouton, RN Phone Number: 12/05/2019, 11:59 AM  Clinical Narrative:    Pt may pick his medications up at [facility] on [address].   Expected Discharge Plan: Home/Self Care Barriers to Discharge: Continued Medical Work up  Expected Discharge Plan and Services Expected Discharge Plan: Home/Self Care   Discharge Planning Services: CM Consult     Expected Discharge Date: 12/05/19               DME Arranged: N/A DME Agency: NA       HH Arranged: NA HH Agency: NA         Social Determinants of Health (SDOH) Interventions    Readmission Risk Interventions No flowsheet data found.

## 2019-12-27 ENCOUNTER — Inpatient Hospital Stay: Payer: BC Managed Care – PPO | Admitting: Critical Care Medicine

## 2019-12-27 NOTE — Progress Notes (Deleted)
   Subjective:    Patient ID: [name], male    DOB: Sep 05, 1985, 34 y.o.   MRN: [id number]   Admit date: 12/02/2019 Discharge date: 12/05/2019  Admitted From: Home Disposition: Home   Recommendations for Outpatient Follow-up:  1. Follow up with PCP to establish care as scheduled prior to discharge.  2. Please obtain BMP and CBC at follow up. Recommend addition/change to ACE or ARB given HTN and DM, though with prolonged time to follow up and stage III CKD, these are not prescribed anew at discharge.  3. continue T2DM management.  Home Health: None Equipment/Devices: None Discharge Condition: Stable CODE STATUS: Full Diet recommendation: Heart healthy, carb-modfied  Brief/Interim Summary: Patient was admitted to the hospital with a working diagnosis of volume overload due to acute kidney injury on chronic kidney disease,in the setting of uncontrolled hypertension, hypertensiveurgency.  34 year old male with past medical history of type 2 diabetes mellitus, hypertension, obesity and chronic kidney disease. Patient presents with 2 weeks of dyspnea, apparently he stopped taking his antihypertensive in January 2021 along with Metformin due to gastrointestinal side effects. On his initial physical examination his blood pressure was 186/109, heart rate 98, respiratory rate 21, temperature 99.1, oxygen saturation 89%, his lungs had scattered rales, heart S1-S2, present rhythm, soft abdomen, positivepittinglower extremity edema. Sodium 141, potassium 4.1, chloride 109, bicarb 24, glucose 132, BUN 33, creatinine 2.58, Ferreri count 8.6, hemoglobin 7.5, hematocrit 24.5, platelets 383. SARS COVID-19 was negative. His chest radiograph had bilateral interstitial infiltrates and bilateral hilar vascular congestion. EKG 91 bpm, normal axis, normal intervals, sinus rhythm, J-point elevation V1 through V3, poor R wave progression,V6 T wave inversion.  Patient's blood pressure has  improved, continue to have elevated serum cr. Improvement in dyspnea and edema.   He did continue to be stable with unchanged renal function and was discharged on 12/05/2019.  Discharge Diagnoses:  Principal Problem:   AKI (acute kidney injury) ([place]) Active Problems:   Anemia   Essential hypertension   SOB (shortness of breath)   Type 2 diabetes mellitus without complication ([place])  AKI on suspected CKD stage IIIa: This is based on available creatinine values. Improved volume status, decreased dyspnea and edema. Renal function stable on day of discharge. CrCl is ~47 at time of discharge. Avoid nephrotoxins.  Uncontrolled HTN/ hypertensive urgency: Improved with norvasc and hydralazine.  - Recommend adding/substituting ACE or ARB per PCP (given need for lab monitoring which is not available until establishing with PCP).   T2DM: Continue home medications and PCP follow up.  Symptomatic anemia, anemia of chronic renal disease associated with iron deficiency.SP2 units PRBC transfusion. Hgb is 9.0. Iron panel with serum iron 42, TIBC 271, Ferritin 196 and transferrin saturation 15.  - Continue iron and PCP follow up.       Review of Systems     Objective:   Physical Exam        Assessment & Plan:

## 2020-02-19 ENCOUNTER — Inpatient Hospital Stay: Payer: BC Managed Care – PPO | Admitting: Critical Care Medicine

## 2020-02-19 NOTE — Progress Notes (Deleted)
   Subjective:    Patient ID: [name], male    DOB: 30-Dec-1985, 34 y.o.   MRN: [id number]  Admit date: 12/02/2019 Discharge date: 12/05/2019  Admitted From: Home Disposition: Home   Recommendations for Outpatient Follow-up:  1. Follow up with PCP to establish care as scheduled prior to discharge.  2. Please obtain BMP and CBC at follow up. Recommend addition/change to ACE or ARB given HTN and DM, though with prolonged time to follow up and stage III CKD, these are not prescribed anew at discharge.  3. continue T2DM management.  Home Health: None Equipment/Devices: None Discharge Condition: Stable CODE STATUS: Full Diet recommendation: Heart healthy, carb-modfied  Brief/Interim Summary: Patient was admitted to the hospital with a working diagnosis of volume overload due to acute kidney injury on chronic kidney disease,in the setting of uncontrolled hypertension, hypertensiveurgency.  34 year old male with past medical history of type 2 diabetes mellitus, hypertension, obesity and chronic kidney disease. Patient presents with 2 weeks of dyspnea, apparently he stopped taking his antihypertensive in January 2021 along with Metformin due to gastrointestinal side effects. On his initial physical examination his blood pressure was 186/109, heart rate 98, respiratory rate 21, temperature 99.1, oxygen saturation 89%, his lungs had scattered rales, heart S1-S2, present rhythm, soft abdomen, positivepittinglower extremity edema. Sodium 141, potassium 4.1, chloride 109, bicarb 24, glucose 132, BUN 33, creatinine 2.58, Span count 8.6, hemoglobin 7.5, hematocrit 24.5, platelets 383. SARS COVID-19 was negative. His chest radiograph had bilateral interstitial infiltrates and bilateral hilar vascular congestion. EKG 91 bpm, normal axis, normal intervals, sinus rhythm, J-point elevation V1 through V3, poor R wave progression,V6 T wave inversion.  Patient's blood pressure has improved,  continue to have elevated serum cr. Improvement in dyspnea and edema.   He did continue to be stable with unchanged renal function and was discharged on 12/05/2019.  Discharge Diagnoses:  Principal Problem:   AKI (acute kidney injury) ([place]) Active Problems:   Anemia   Essential hypertension   SOB (shortness of breath)   Type 2 diabetes mellitus without complication ([place])  AKI on suspected CKD stage IIIa: This is based on available creatinine values. Improved volume status, decreased dyspnea and edema. Renal function stable on day of discharge. CrCl is ~47 at time of discharge. Avoid nephrotoxins.  Uncontrolled HTN/ hypertensive urgency: Improved with norvasc and hydralazine.  - Recommend adding/substituting ACE or ARB per PCP (given need for lab monitoring which is not available until establishing with PCP).   T2DM: Continue home medications and PCP follow up.  Symptomatic anemia, anemia of chronic renal disease associated with iron deficiency.SP2 units PRBC transfusion. Hgb is 9.0. Iron panel with serum iron 42, TIBC 271, Ferritin 196 and transferrin saturation 15.  - Continue iron and PCP follow up.     Review of Systems     Objective:   Physical Exam        Assessment & Plan:

## 2020-02-25 ENCOUNTER — Emergency Department (HOSPITAL_COMMUNITY): Payer: BC Managed Care – PPO

## 2020-02-25 ENCOUNTER — Inpatient Hospital Stay (HOSPITAL_COMMUNITY)
Admission: EM | Admit: 2020-02-25 | Discharge: 2020-03-01 | DRG: 305 | Disposition: A | Discharge status: Home / Self Care | Payer: BC Managed Care – PPO | Attending: Internal Medicine | Admitting: Internal Medicine

## 2020-02-25 ENCOUNTER — Encounter (HOSPITAL_COMMUNITY): Payer: Self-pay | Admitting: Emergency Medicine

## 2020-02-25 ENCOUNTER — Other Ambulatory Visit: Payer: Self-pay

## 2020-02-25 DIAGNOSIS — I160 Hypertensive urgency: Secondary | ICD-10-CM | POA: Diagnosis not present

## 2020-02-25 DIAGNOSIS — E1165 Type 2 diabetes mellitus with hyperglycemia: Secondary | ICD-10-CM | POA: Diagnosis present

## 2020-02-25 DIAGNOSIS — Z823 Family history of stroke: Secondary | ICD-10-CM

## 2020-02-25 DIAGNOSIS — N1832 Chronic kidney disease, stage 3b: Secondary | ICD-10-CM | POA: Diagnosis present

## 2020-02-25 DIAGNOSIS — Z79899 Other long term (current) drug therapy: Secondary | ICD-10-CM

## 2020-02-25 DIAGNOSIS — Z20822 Contact with and (suspected) exposure to covid-19: Secondary | ICD-10-CM | POA: Diagnosis present

## 2020-02-25 DIAGNOSIS — E860 Dehydration: Secondary | ICD-10-CM | POA: Diagnosis not present

## 2020-02-25 DIAGNOSIS — D649 Anemia, unspecified: Secondary | ICD-10-CM | POA: Diagnosis present

## 2020-02-25 DIAGNOSIS — E109 Type 1 diabetes mellitus without complications: Secondary | ICD-10-CM

## 2020-02-25 DIAGNOSIS — Z794 Long term (current) use of insulin: Secondary | ICD-10-CM

## 2020-02-25 DIAGNOSIS — Z9119 Patient's noncompliance with other medical treatment and regimen: Secondary | ICD-10-CM | POA: Diagnosis not present

## 2020-02-25 DIAGNOSIS — I10 Essential (primary) hypertension: Secondary | ICD-10-CM | POA: Diagnosis not present

## 2020-02-25 DIAGNOSIS — I129 Hypertensive chronic kidney disease with stage 1 through stage 4 chronic kidney disease, or unspecified chronic kidney disease: Secondary | ICD-10-CM | POA: Diagnosis present

## 2020-02-25 DIAGNOSIS — N179 Acute kidney failure, unspecified: Secondary | ICD-10-CM | POA: Diagnosis not present

## 2020-02-25 DIAGNOSIS — R7989 Other specified abnormal findings of blood chemistry: Secondary | ICD-10-CM | POA: Diagnosis present

## 2020-02-25 DIAGNOSIS — E1122 Type 2 diabetes mellitus with diabetic chronic kidney disease: Secondary | ICD-10-CM

## 2020-02-25 DIAGNOSIS — R042 Hemoptysis: Secondary | ICD-10-CM

## 2020-02-25 DIAGNOSIS — J811 Chronic pulmonary edema: Secondary | ICD-10-CM | POA: Diagnosis present

## 2020-02-25 DIAGNOSIS — N184 Chronic kidney disease, stage 4 (severe): Secondary | ICD-10-CM | POA: Diagnosis present

## 2020-02-25 DIAGNOSIS — E611 Iron deficiency: Secondary | ICD-10-CM | POA: Diagnosis present

## 2020-02-25 DIAGNOSIS — Z833 Family history of diabetes mellitus: Secondary | ICD-10-CM | POA: Diagnosis not present

## 2020-02-25 DIAGNOSIS — R140 Abdominal distension (gaseous): Secondary | ICD-10-CM

## 2020-02-25 DIAGNOSIS — R319 Hematuria, unspecified: Secondary | ICD-10-CM | POA: Diagnosis present

## 2020-02-25 DIAGNOSIS — E119 Type 2 diabetes mellitus without complications: Secondary | ICD-10-CM

## 2020-02-25 DIAGNOSIS — Z809 Family history of malignant neoplasm, unspecified: Secondary | ICD-10-CM

## 2020-02-25 DIAGNOSIS — R809 Proteinuria, unspecified: Secondary | ICD-10-CM

## 2020-02-25 DIAGNOSIS — Z8249 Family history of ischemic heart disease and other diseases of the circulatory system: Secondary | ICD-10-CM | POA: Diagnosis not present

## 2020-02-25 HISTORY — DX: Essential (primary) hypertension: I10

## 2020-02-25 LAB — COMPREHENSIVE METABOLIC PANEL
ALT: 18 U/L (ref 0–44)
AST: 17 U/L (ref 15–41)
Albumin: 2.9 g/dL — ABNORMAL LOW (ref 3.5–5.0)
Alkaline Phosphatase: 70 U/L (ref 38–126)
Anion gap: 10 (ref 5–15)
BUN: 55 mg/dL — ABNORMAL HIGH (ref 6–20)
CO2: 25 mmol/L (ref 22–32)
Calcium: 8.1 mg/dL — ABNORMAL LOW (ref 8.9–10.3)
Chloride: 105 mmol/L (ref 98–111)
Creatinine, Ser: 4.26 mg/dL — ABNORMAL HIGH (ref 0.61–1.24)
GFR calc Af Amer: 20 mL/min — ABNORMAL LOW (ref >60)
GFR calc non Af Amer: 17 mL/min — ABNORMAL LOW (ref >60)
Glucose, Bld: 119 mg/dL — ABNORMAL HIGH (ref 70–99)
Potassium: 4.2 mmol/L (ref 3.5–5.1)
Sodium: 140 mmol/L (ref 135–145)
Total Bilirubin: 0.8 mg/dL (ref 0.3–1.2)
Total Protein: 6.5 g/dL (ref 6.5–8.1)

## 2020-02-25 LAB — CBC
HCT: 24.7 % — ABNORMAL LOW (ref 39.0–52.0)
Hemoglobin: 7.8 g/dL — ABNORMAL LOW (ref 13.0–17.0)
MCH: 25.4 pg — ABNORMAL LOW (ref 26.0–34.0)
MCHC: 31.6 g/dL (ref 30.0–36.0)
MCV: 80.5 fL (ref 80.0–100.0)
Platelets: 301 10*3/uL (ref 150–400)
RBC: 3.07 MIL/uL — ABNORMAL LOW (ref 4.22–5.81)
RDW: 15 % (ref 11.5–15.5)
WBC: 7.7 10*3/uL (ref 4.0–10.5)
nRBC: 0 % (ref 0.0–0.2)

## 2020-02-25 LAB — GLUCOSE, CAPILLARY: Glucose-Capillary: 110 mg/dL — ABNORMAL HIGH (ref 70–99)

## 2020-02-25 LAB — URINALYSIS, ROUTINE W REFLEX MICROSCOPIC
Bacteria, UA: NONE SEEN
Bilirubin Urine: NEGATIVE
Glucose, UA: NEGATIVE mg/dL
Ketones, ur: NEGATIVE mg/dL
Leukocytes,Ua: NEGATIVE
Nitrite: NEGATIVE
Protein, ur: >=300 mg/dL — AB
Specific Gravity, Urine: 1.011 (ref 1.005–1.030)
pH: 5 (ref 5.0–8.0)

## 2020-02-25 LAB — CBG MONITORING, ED: Glucose-Capillary: 91 mg/dL (ref 70–99)

## 2020-02-25 LAB — HEMOGLOBIN A1C
Hgb A1c MFr Bld: 6.5 % — ABNORMAL HIGH (ref 4.8–5.6)
Mean Plasma Glucose: 139.85 mg/dL

## 2020-02-25 LAB — POC OCCULT BLOOD, ED: Fecal Occult Bld: NEGATIVE

## 2020-02-25 LAB — SARS CORONAVIRUS 2 BY RT PCR (HOSPITAL ORDER, PERFORMED IN ~~LOC~~ HOSPITAL LAB): SARS Coronavirus 2: NEGATIVE

## 2020-02-25 LAB — CREATININE, URINE, RANDOM: Creatinine, Urine: 100.65 mg/dL

## 2020-02-25 LAB — SODIUM, URINE, RANDOM: Sodium, Ur: 48 mmol/L

## 2020-02-25 LAB — BRAIN NATRIURETIC PEPTIDE: B Natriuretic Peptide: 694.9 pg/mL — ABNORMAL HIGH (ref 0.0–100.0)

## 2020-02-25 LAB — HIV ANTIBODY (ROUTINE TESTING W REFLEX): HIV Screen 4th Generation wRfx: NONREACTIVE

## 2020-02-25 LAB — LIPASE, BLOOD: Lipase: 21 U/L (ref 11–51)

## 2020-02-25 MED ORDER — ONDANSETRON 4 MG PO TBDP
4.0000 mg | ORAL_TABLET | Freq: Once | ORAL | Status: AC
  Administered 2020-02-25: 4 mg via ORAL
  Filled 2020-02-25: qty 1

## 2020-02-25 MED ORDER — INSULIN DETEMIR 100 UNIT/ML ~~LOC~~ SOLN
5.0000 [IU] | Freq: Two times a day (BID) | SUBCUTANEOUS | Status: DC
  Administered 2020-02-25 – 2020-03-01 (×10): 5 [IU] via SUBCUTANEOUS
  Filled 2020-02-25 (×11): qty 0.05

## 2020-02-25 MED ORDER — AMLODIPINE BESYLATE 5 MG PO TABS
10.0000 mg | ORAL_TABLET | Freq: Once | ORAL | Status: AC
  Administered 2020-02-25: 10 mg via ORAL
  Filled 2020-02-25: qty 2

## 2020-02-25 MED ORDER — SODIUM CHLORIDE 0.9 % IV SOLN
1000.0000 mL | INTRAVENOUS | Status: DC
  Administered 2020-02-25: 1000 mL via INTRAVENOUS

## 2020-02-25 MED ORDER — POLYETHYLENE GLYCOL 3350 17 G PO PACK
17.0000 g | PACK | Freq: Every day | ORAL | Status: DC | PRN
  Filled 2020-02-25: qty 1

## 2020-02-25 MED ORDER — INSULIN ASPART 100 UNIT/ML ~~LOC~~ SOLN
2.0000 [IU] | Freq: Three times a day (TID) | SUBCUTANEOUS | Status: DC
  Administered 2020-02-25 – 2020-02-27 (×2): 2 [IU] via SUBCUTANEOUS
  Filled 2020-02-25: qty 0.02

## 2020-02-25 MED ORDER — CARVEDILOL 6.25 MG PO TABS
6.2500 mg | ORAL_TABLET | Freq: Two times a day (BID) | ORAL | Status: DC
  Administered 2020-02-25 – 2020-03-01 (×11): 6.25 mg via ORAL
  Filled 2020-02-25 (×7): qty 1
  Filled 2020-02-25: qty 2
  Filled 2020-02-25 (×3): qty 1

## 2020-02-25 MED ORDER — ALBUTEROL SULFATE (2.5 MG/3ML) 0.083% IN NEBU: 2.5000 mg | INHALATION_SOLUTION | RESPIRATORY_TRACT | Status: DC | PRN

## 2020-02-25 MED ORDER — ACETAMINOPHEN 325 MG PO TABS
650.0000 mg | ORAL_TABLET | Freq: Four times a day (QID) | ORAL | Status: DC | PRN
  Administered 2020-02-26: 16:00:00 650 mg via ORAL
  Filled 2020-02-25: qty 2

## 2020-02-25 MED ORDER — SODIUM CHLORIDE 0.9 % IV SOLN: INTRAVENOUS | Status: DC

## 2020-02-25 MED ORDER — SODIUM CHLORIDE 0.9 % IV SOLN: 1000.0000 mL | INTRAVENOUS | Status: DC

## 2020-02-25 MED ORDER — ISOSORB DINITRATE-HYDRALAZINE 20-37.5 MG PO TABS
1.0000 | ORAL_TABLET | Freq: Three times a day (TID) | ORAL | Status: DC
  Administered 2020-02-26 – 2020-03-01 (×14): 1 via ORAL
  Filled 2020-02-25 (×15): qty 1

## 2020-02-25 MED ORDER — ONDANSETRON HCL 4 MG PO TABS: 4.0000 mg | ORAL_TABLET | Freq: Four times a day (QID) | ORAL | Status: DC | PRN

## 2020-02-25 MED ORDER — LABETALOL HCL 5 MG/ML IV SOLN
20.0000 mg | Freq: Once | INTRAVENOUS | Status: AC
  Administered 2020-02-25: 20 mg via INTRAVENOUS
  Filled 2020-02-25: qty 4

## 2020-02-25 MED ORDER — ENOXAPARIN SODIUM 40 MG/0.4ML ~~LOC~~ SOLN
40.0000 mg | SUBCUTANEOUS | Status: DC
  Administered 2020-02-25 – 2020-02-26 (×2): 40 mg via SUBCUTANEOUS
  Filled 2020-02-25 (×2): qty 0.4

## 2020-02-25 MED ORDER — ACETAMINOPHEN 650 MG RE SUPP: 650.0000 mg | Freq: Four times a day (QID) | RECTAL | Status: DC | PRN

## 2020-02-25 MED ORDER — CARVEDILOL 3.125 MG PO TABS: 3.1250 mg | ORAL_TABLET | Freq: Two times a day (BID) | ORAL | Status: DC

## 2020-02-25 MED ORDER — HYDRALAZINE HCL 20 MG/ML IJ SOLN
10.0000 mg | Freq: Four times a day (QID) | INTRAMUSCULAR | Status: DC | PRN
  Administered 2020-02-29 – 2020-03-01 (×3): 10 mg via INTRAVENOUS
  Filled 2020-02-25 (×3): qty 1

## 2020-02-25 MED ORDER — ONDANSETRON HCL 4 MG/2ML IJ SOLN
4.0000 mg | Freq: Four times a day (QID) | INTRAMUSCULAR | Status: DC | PRN
  Administered 2020-02-26: 16:00:00 4 mg via INTRAVENOUS
  Filled 2020-02-25: qty 2

## 2020-02-25 MED ORDER — SODIUM CHLORIDE 0.9 % IV BOLUS (SEPSIS)
1000.0000 mL | Freq: Once | INTRAVENOUS | Status: AC
  Administered 2020-02-25: 1000 mL via INTRAVENOUS

## 2020-02-25 MED ORDER — INSULIN ASPART 100 UNIT/ML ~~LOC~~ SOLN
0.0000 [IU] | Freq: Three times a day (TID) | SUBCUTANEOUS | Status: DC
  Administered 2020-02-27: 15:00:00 1 [IU] via SUBCUTANEOUS
  Filled 2020-02-25: qty 0.09

## 2020-02-25 NOTE — ED Provider Notes (Signed)
[facility] DEPT Provider Note   CSN: [id number] Arrival date & time: 02/25/20  1025     History Chief Complaint  Patient presents with  . Emesis  . Fatigue    [name] is a 34 y.o. male.  HPI   Pt states for the last week he has been feeling hungry but every time he eats he will vomit.  This morning he started vomiting again and he noticed some blood in his emesis.    Over the past week he has vomited 5 times.  No diarrhea.  No abd pain but states he feels hunger pains.    No pain with eating.  Pt was in the hospital in May and was admitted for troubles with his kidney and diabetes..  Pt has not seen anyone since then but has an appointment in September.  No covid vaccination.  Patient states he was started on blood pressure medications but has not been taking any because of the side effects.  Past Medical History:  Diagnosis Date  . Diabetes mellitus   . High cholesterol   . Hypertension     Patient Active Problem List   Diagnosis Date Noted  . AKI (acute kidney injury) ([place]) 12/02/2019  . Anemia 12/02/2019  . Essential hypertension 12/02/2019  . SOB (shortness of breath) 12/02/2019  . Type 2 diabetes mellitus without complication ([place]) 58/52/7782    Past Surgical History:  Procedure Laterality Date  . FRACTURE SURGERY    . I & D EXTREMITY Left 07/16/2014   Procedure: IRRIGATION AND DEBRIDEMENT EXTREMITY/LEFT INDEX FINGER;  Surgeon: Leanora Cover, MD;  Location: [facility];  Service: Orthopedics;  Laterality: Left;       Family History  Problem Relation Age of Onset  . Cancer Mother   . Stroke Father   . Diabetes Father   . Hypertension Father     Social History   Tobacco Use  . Smoking status: Never Smoker  . Smokeless tobacco: Never Used  Vaping Use  . Vaping Use: Never used  Substance Use Topics  . Alcohol use: Yes  . Drug use: No    Home Medications Prior to Admission medications   Medication Sig Start Date End Date  Taking? Authorizing Provider  insulin detemir (LEVEMIR) 100 UNIT/ML injection Inject 30 Units into the skin daily.   Yes [provider]  amLODipine (NORVASC) 10 MG tablet Take 1 tablet (10 mg total) by mouth daily. Patient not taking: Reported on 02/25/2020 12/05/19   Patrecia Pour, MD  ferrous sulfate 325 (65 FE) MG tablet Take 1 tablet (325 mg total) by mouth daily with breakfast. Patient not taking: Reported on 02/25/2020 12/05/19   Patrecia Pour, MD  hydrALAZINE (APRESOLINE) 50 MG tablet Take 1 tablet (50 mg total) by mouth every 8 (eight) hours. Patient not taking: Reported on 02/25/2020 12/05/19   Patrecia Pour, MD    Allergies    Patient has no known allergies.  Review of Systems   Review of Systems  Constitutional: Negative for fever.  Respiratory: Positive for shortness of breath.   Gastrointestinal: Negative for diarrhea.  Genitourinary: Negative for dysuria.  All other systems reviewed and are negative.   Physical Exam Updated Vital Signs BP (!) 191/118   Pulse 88   Temp 97.8 F (36.6 C) (Oral)   Resp (!) 22   Ht 1.829 m (6')   Wt 106.6 kg   SpO2 96%   BMI 31.87 kg/m   Physical  Exam Vitals and nursing note reviewed.  Constitutional:      General: He is not in acute distress.    Appearance: He is well-developed.  HENT:     Head: Normocephalic and atraumatic.     Right Ear: External ear normal.     Left Ear: External ear normal.  Eyes:     General: No scleral icterus.       Right eye: No discharge.        Left eye: No discharge.     Conjunctiva/sclera: Conjunctivae normal.  Neck:     Trachea: No tracheal deviation.  Cardiovascular:     Rate and Rhythm: Normal rate and regular rhythm.  Pulmonary:     Effort: Pulmonary effort is normal. No respiratory distress.     Breath sounds: Normal breath sounds. No stridor. No wheezing or rales.  Abdominal:     General: Bowel sounds are normal. There is no distension.     Palpations: Abdomen is soft.      Tenderness: There is no abdominal tenderness. There is no guarding or rebound.  Musculoskeletal:        General: No tenderness.     Cervical back: Neck supple.  Skin:    General: Skin is warm and dry.     Findings: No rash.  Neurological:     Mental Status: He is alert.     Cranial Nerves: No cranial nerve deficit (no facial droop, extraocular movements intact, no slurred speech).     Sensory: No sensory deficit.     Motor: No abnormal muscle tone or seizure activity.     Coordination: Coordination normal.     ED Results / Procedures / Treatments   Labs (all labs ordered are listed, but only abnormal results are displayed) Labs Reviewed  COMPREHENSIVE METABOLIC PANEL - Abnormal; Notable for the following components:      Result Value   Glucose, Bld 119 (*)    BUN 55 (*)    Creatinine, Ser 4.26 (*)    Calcium 8.1 (*)    Albumin 2.9 (*)    GFR calc non Af Amer 17 (*)    GFR calc Af Amer 20 (*)    All other components within normal limits  CBC - Abnormal; Notable for the following components:   RBC 3.07 (*)    Hemoglobin 7.8 (*)    HCT 24.7 (*)    MCH 25.4 (*)    All other components within normal limits  URINALYSIS, ROUTINE W REFLEX MICROSCOPIC - Abnormal; Notable for the following components:   Color, Urine STRAW (*)    Hgb urine dipstick MODERATE (*)    Protein, ur >=300 (*)    All other components within normal limits  SARS CORONAVIRUS 2 BY RT PCR (HOSPITAL ORDER, [facility])  LIPASE, BLOOD  POC OCCULT BLOOD, ED    EKG EKG Interpretation  Date/Time:  Monday February 25 2020 12:26:09 EDT Ventricular Rate:  88 PR Interval:    QRS Duration: 97 QT Interval:  383 QTC Calculation: 464 R Axis:   77 Text Interpretation: Sinus rhythm Probable left ventricular hypertrophy Abnormal T, consider ischemia, lateral leads Anterior ST elevation, probably due to LVH No significant change since last tracing Confirmed by Dorie Rank [phone]) on 02/25/2020  12:35:47 PM   Radiology DG Chest Portable 1 View  Result Date: 02/25/2020 CLINICAL DATA:  Dyspnea, vomiting EXAM: PORTABLE CHEST 1 VIEW COMPARISON:  12/02/2019 FINDINGS: The lungs are well expanded and are  symmetric. Trace interstitial pulmonary edema within the lung bases persists, similar to that noted on prior examination. Mild cardiomegaly, however, has slightly progressed since prior exam. No pneumothorax or pleural effusion. No acute bone abnormality. IMPRESSION: Mild progressive cardiomegaly. Stable trace interstitial pulmonary edema, possibly cardiogenic in nature. Electronically Signed   By: Fidela Salisbury MD   On: 02/25/2020 13:09    Procedures .Critical Care Performed by: Dorie Rank, MD Authorized by: Dorie Rank, MD   Critical care provider statement:    Critical care time (minutes):  30   Critical care was time spent personally by me on the following activities:  Discussions with consultants, evaluation of patient's response to treatment, examination of patient, ordering and performing treatments and interventions, ordering and review of laboratory studies, ordering and review of radiographic studies, pulse oximetry, re-evaluation of patient's condition, obtaining history from patient or surrogate and review of old charts   (including critical care time)  Medications Ordered in ED Medications  sodium chloride 0.9 % bolus 1,000 mL (has no administration in time range)    Followed by  0.9 %  sodium chloride infusion (has no administration in time range)  labetalol (NORMODYNE) injection 20 mg (has no administration in time range)  ondansetron (ZOFRAN-ODT) disintegrating tablet 4 mg (4 mg Oral Given 02/25/20 1220)  amLODipine (NORVASC) tablet 10 mg (10 mg Oral Given 02/25/20 1220)    ED Course  I have reviewed the triage vital signs and the nursing notes.  Pertinent labs & imaging results that were available during my care of the patient were reviewed by me and considered in my  medical decision making (see chart for details).  Clinical Course as of Feb 24 1402  Mon Feb 25, 2020  1158 Hemoglobin decreased at 7.8   [JK]  1158 Down from 2 months ago   [JK]  1332 Labs reviewed.  Hemoccult is negative   [JK]  1332 Renal function has worsened significantly   [JK]  1333 Have ordered IV fluids   [JK]    Clinical Course User Index [JK] Dorie Rank, MD   MDM Rules/Calculators/A&P                         Patient presented to ED for evaluation of nausea vomiting.  Patient does have history of diabetes as well as chronic kidney disease and hypertension.  Patient has not been compliant with his medications since his hospital discharge.  Patient is notably hypertensive.  His laboratory tests do show worsening anemia but no signs of melena his Hemoccult is negative.  Still could consider GI consultation.  Patient does have worsening renal insufficiency.  Patient also was notably hypertensive.  I suspect his hypertension is contributing to his kidney disease.  Is possible his nausea vomiting may be related to his uremia although the vomiting and dehydration could be causing his acute kidney injury as well.  I have ordered IV fluids.  I will consult with the medical service for admission and further treatment.  Final Clinical Impression(s) / ED Diagnoses Final diagnoses:  AKI (acute kidney injury) ([place])  Dehydration  Hypertensive urgency     Dorie Rank, MD 02/25/20 [phone]

## 2020-02-25 NOTE — H&P (Addendum)
History and Physical  LEWIS KEATS NLG:[id number] DOB: 20-Mar-1986 DOA: 02/25/2020  PCP: Patient, No Pcp Per Patient coming from: Home  I have personally briefly reviewed patient's old medical records in [facility]   Chief Complaint: SOB  HPI: [name] is a 34 y.o. male past medical history of hypertension and diabetes he was recently discharged from the hospital on 12/05/2019 for acute kidney injury and hypertensive emergency due to noncompliance who comes in to the hospital because he has been vomiting over the past week at least five or six times he denies any diarrhea abdominal pain or chest pain. Patient has not seen a primary care doctor since discharge he relates he quit taking his medication as it was making him feel bad.  In the ED: He was found to have a blood pressure of 190/129 the satting greater 94% on room air, weight, UA negative chest x-ray showed a medically stable interstitial pulmonary edema.   Review of Systems: All systems reviewed and apart from history of presenting illness, are negative.  Past Medical History:  Diagnosis Date  . Diabetes mellitus   . High cholesterol   . Hypertension    Past Surgical History:  Procedure Laterality Date  . FRACTURE SURGERY    . I & D EXTREMITY Left 07/16/2014   Procedure: IRRIGATION AND DEBRIDEMENT EXTREMITY/LEFT INDEX FINGER;  Surgeon: Leanora Cover, MD;  Location: [facility];  Service: Orthopedics;  Laterality: Left;   Social History:  reports that he has never smoked. He has never used smokeless tobacco. He reports current alcohol use. He reports that he does not use drugs.   No Known Allergies  Family History  Problem Relation Age of Onset  . Cancer Mother   . Stroke Father   . Diabetes Father   . Hypertension Father     Prior to Admission medications   Medication Sig Start Date End Date Taking? Authorizing Provider  insulin detemir (LEVEMIR) 100 UNIT/ML injection Inject 30 Units into the skin daily.    Yes [provider]  amLODipine (NORVASC) 10 MG tablet Take 1 tablet (10 mg total) by mouth daily. Patient not taking: Reported on 02/25/2020 12/05/19   Patrecia Pour, MD  ferrous sulfate 325 (65 FE) MG tablet Take 1 tablet (325 mg total) by mouth daily with breakfast. Patient not taking: Reported on 02/25/2020 12/05/19   Patrecia Pour, MD  hydrALAZINE (APRESOLINE) 50 MG tablet Take 1 tablet (50 mg total) by mouth every 8 (eight) hours. Patient not taking: Reported on 02/25/2020 12/05/19   Patrecia Pour, MD   Physical Exam: Vitals:   02/25/20 1315 02/25/20 1345 02/25/20 1438 02/25/20 1500  BP: (!) 193/123 (!) 191/118 140/82 (!) 149/95  Pulse: 94 88 83 89  Resp: 16 (!) 22 (!) 21 18  Temp:      TempSrc:      SpO2: 95% 96% 94% 94%  Weight:      Height:         General exam: Obese male moderately built  Head, eyes and ENT: Nontraumatic and normocephalic. Pupils equally reacting to light and accommodation. Oral mucosa moist.  Neck: Supple. No JVD  Lymphatics: No lymphadenopathy.  Respiratory system: Clear to auscultation. Crackles at bases  Cardiovascular system: S1 and S2 heard, RRR. No JVD.  Gastrointestinal system: Soft nontender nondistended  Central nervous system: Alert and oriented. No focal neurological deficits.  Extremities: Symmetric 5 x 5 power. Peripheral pulses symmetrically felt.   Skin:  No rashes or acute findings.  Musculoskeletal system: Negative exam.  Psychiatry: Pleasant and cooperative.   Labs on Admission:  Basic Metabolic Panel: Recent Labs  Lab 02/25/20 1127  NA 140  K 4.2  CL 105  CO2 25  GLUCOSE 119*  BUN 55*  CREATININE 4.26*  CALCIUM 8.1*   Liver Function Tests: Recent Labs  Lab 02/25/20 1127  AST 17  ALT 18  ALKPHOS 70  BILITOT 0.8  PROT 6.5  ALBUMIN 2.9*   Recent Labs  Lab 02/25/20 1127  LIPASE 21   No results for input(s): AMMONIA in the last 168 hours. CBC: Recent Labs  Lab 02/25/20 1127  WBC 7.7  HGB  7.8*  HCT 24.7*  MCV 80.5  PLT 301   Cardiac Enzymes: No results for input(s): CKTOTAL, CKMB, CKMBINDEX, TROPONINI in the last 168 hours.  BNP (last 3 results) No results for input(s): PROBNP in the last 8760 hours. CBG: No results for input(s): GLUCAP in the last 168 hours.  Radiological Exams on Admission: DG Chest Portable 1 View  Result Date: 02/25/2020 CLINICAL DATA:  Dyspnea, vomiting EXAM: PORTABLE CHEST 1 VIEW COMPARISON:  12/02/2019 FINDINGS: The lungs are well expanded and are symmetric. Trace interstitial pulmonary edema within the lung bases persists, similar to that noted on prior examination. Mild cardiomegaly, however, has slightly progressed since prior exam. No pneumothorax or pleural effusion. No acute bone abnormality. IMPRESSION: Mild progressive cardiomegaly. Stable trace interstitial pulmonary edema, possibly cardiogenic in nature. Electronically Signed   By: Fidela Salisbury MD   On: 02/25/2020 13:09    EKG: Independently reviewed. Sinus rhythm normal axis LVH preserved intervals  Assessment/Plan Hypertensive urgency/  Essential hypertension: I think there is probably contributing to his nausea and vomiting it becomes a difficult situation as he is crackles on Chest x-ray that shows mild pulmonary edema and he has new acute renal failure which is probably due to his uncontrolled hypertension and diabetes. Pressure is improved here in the ED I will go ahead and start on Coreg and BiDil. Use hydralazine IV as needed for blood pressure greater than 160/100. And continue to monitor his blood pressure closely we will try not to decrease more than 25% as he relates last time they put him on two blood pressure medications and that was making him throw up, will use this was because of the sudden drop with his blood pressure.  AKI (acute kidney injury) ([place]) Probably secondary to noncompliance of his diabetes and hypertension medication. Renal US on 5/ 23 2021 showed diffuse  increased echogenicity likely due to chronic medical disease. We will start him on gentle IV fluid hydration he received a liter bolus of normal saline in the ED monitor strict I's and O's and daily weights. We will try to add a urinary sodium and urine creatinine to his UA. UA shows specific gravity of 1011 which does not point towards dehydration showed there is acute kidney injury is most likely due to his uncontrolled hypertension.  Type 2 diabetes mellitus without complication ([facility]) 2 months ago his last A1c was 6.8 (higher, will go ahead and start on long-acting insulin plus sliding scale.   DVT Prophylaxis: lovenox Code Status: full  Family Communication: none  Disposition Plan: inpatient       It is my clinical opinion that admission to INPATIENT is reasonable and necessary in this 34 y.o. male past medical history of hypertension uncontrolled diabetes mellitus comes in with hypertensive emergency with endorgan damage.  Given the  aforementioned, the predictability of an adverse outcome is felt to be significant. I expect that the patient will require at least 2 midnights in the hospital to treat this condition.  Charlynne Cousins MD [facility]   02/25/2020, 3:16 PM

## 2020-02-25 NOTE — ED Triage Notes (Signed)
Pt reports for week been vomiting everyday. Reports felling weak and tired as well. Denies any urinary or bowel problems. Reports noticed red blood in emesis this morning.  Reports hx HTn but not able to take the medications due to making him feel bad.

## 2020-02-25 NOTE — ED Notes (Signed)
ED TO INPATIENT HANDOFF REPORT  Name/Age/Gender [name] 34 y.o. male  Code Status    Code Status Orders  (From admission, onward)         Start     Ordered   02/25/20 1559  Full code  Continuous        02/25/20 1558        Code Status History    Date Active Date Inactive Code Status Order ID Comments User Context   12/02/2019 1253 12/05/2019 1858 Full Code [id number]  Dwyane Dee, MD Inpatient   Advance Care Planning Activity      Home/SNF/Other Home  Chief Complaint Hypertensive urgency [I16.0]  Level of Care/Admitting Diagnosis ED Disposition    ED Disposition Condition [facility] Area: [facility] [[id number]]  Level of Care: Med-Surg [16]  Covid Evaluation: Asymptomatic Screening Protocol (No Symptoms)  Diagnosis: Hypertensive urgency [683419]  Admitting Physician: Charlynne Cousins [[id number]]  Attending Physician: Charlynne Cousins [[id number]]  Estimated length of stay: past midnight tomorrow  Certification:: I certify this patient will need inpatient services for at least 2 midnights       Medical History Past Medical History:  Diagnosis Date  . Diabetes mellitus   . High cholesterol   . Hypertension     Allergies No Known Allergies  IV Location/Drains/Wounds Patient Lines/Drains/Airways Status    Active Line/Drains/Airways    Name Placement date Placement time Site Days   Peripheral IV 02/25/20 Right;Posterior Forearm 02/25/20  1418  Forearm  less than 1   Incision (Closed) 07/16/14 Finger (Comment which one) Left 07/16/14  2009   2050          Labs/Imaging Results for orders placed or performed during the hospital encounter of 02/25/20 (from the past 48 hour(s))  Lipase, blood     Status: None   Collection Time: 02/25/20 11:27 AM  Result Value Ref Range   Lipase 21 11 - 51 U/L    Comment: Performed at [facility], [place] [address] [place], [place] [address]  Comprehensive  metabolic panel     Status: Abnormal   Collection Time: 02/25/20 11:27 AM  Result Value Ref Range   Sodium 140 135 - 145 mmol/L   Potassium 4.2 3.5 - 5.1 mmol/L   Chloride 105 98 - 111 mmol/L   CO2 25 22 - 32 mmol/L   Glucose, Bld 119 (H) 70 - 99 mg/dL    Comment: Glucose reference range applies only to samples taken after fasting for at least 8 hours.   BUN 55 (H) 6 - 20 mg/dL   Creatinine, Ser 4.26 (H) 0.61 - 1.24 mg/dL   Calcium 8.1 (L) 8.9 - 10.3 mg/dL   Total Protein 6.5 6.5 - 8.1 g/dL   Albumin 2.9 (L) 3.5 - 5.0 g/dL   AST 17 15 - 41 U/L   ALT 18 0 - 44 U/L   Alkaline Phosphatase 70 38 - 126 U/L   Total Bilirubin 0.8 0.3 - 1.2 mg/dL   GFR calc non Af Amer 17 (L) >60 mL/min   GFR calc Af Amer 20 (L) >60 mL/min   Anion gap 10 5 - 15    Comment: Performed at [facility], [place] [address] [place], [place] [address]  CBC     Status: Abnormal   Collection Time: 02/25/20 11:27 AM  Result Value Ref Range   WBC 7.7 4.0 - 10.5 K/uL   RBC 3.07 (L)  4.22 - 5.81 MIL/uL   Hemoglobin 7.8 (L) 13.0 - 17.0 g/dL   HCT 24.7 (L) 39 - 52 %   MCV 80.5 80.0 - 100.0 fL   MCH 25.4 (L) 26.0 - 34.0 pg   MCHC 31.6 30.0 - 36.0 g/dL   RDW 15.0 11.5 - 15.5 %   Platelets 301 150 - 400 K/uL   nRBC 0.0 0.0 - 0.2 %    Comment: Performed at [facility], [place] [address], [place], [place], [place] [address]  Brain natriuretic peptide     Status: Abnormal   Collection Time: 02/25/20 11:27 AM  Result Value Ref Range   B Natriuretic Peptide 694.9 (H) 0.0 - 100.0 pg/mL    Comment: Performed at [facility], [place] [address] [place], [place] [address]  SARS Coronavirus 2 by RT PCR (hospital order, performed in [facility] hospital lab) Nasopharyngeal Nasopharyngeal Swab     Status: None   Collection Time: 02/25/20 11:49 AM   Specimen: Nasopharyngeal Swab  Result Value Ref Range   SARS Coronavirus 2 NEGATIVE NEGATIVE    Comment: (NOTE) SARS-CoV-2 target nucleic  acids are NOT DETECTED.  The SARS-CoV-2 RNA is generally detectable in upper and lower respiratory specimens during the acute phase of infection. The lowest concentration of SARS-CoV-2 viral copies this assay can detect is 250 copies / mL. A negative result does not preclude SARS-CoV-2 infection and should not be used as the sole basis for treatment or other patient management decisions.  A negative result may occur with improper specimen collection / handling, submission of specimen other than nasopharyngeal swab, presence of viral mutation(s) within the areas targeted by this assay, and inadequate number of viral copies (<250 copies / mL). A negative result must be combined with clinical observations, patient history, and epidemiological information.  Fact Sheet for Patients:   StrictlyIdeas.no  Fact Sheet for Healthcare Providers: BankingDealers.co.za  This test is not yet approved or  cleared by the [place] FDA and has been authorized for detection and/or diagnosis of SARS-CoV-2 by FDA under an Emergency Use Authorization (EUA).  This EUA will remain in effect (meaning this test can be used) for the duration of the COVID-19 declaration under Section 564(b)(1) of the Act, 21 U.S.C. section 360bbb-3(b)(1), unless the authorization is terminated or revoked sooner.  Performed at [facility], [place] [address], [place], [place] [address]   Urinalysis, Routine w reflex microscopic Urine, Clean Catch     Status: Abnormal   Collection Time: 02/25/20 12:37 PM  Result Value Ref Range   Color, Urine STRAW (A) YELLOW   APPearance CLEAR CLEAR   Specific Gravity, Urine 1.011 1.005 - 1.030   pH 5.0 5.0 - 8.0   Glucose, UA NEGATIVE NEGATIVE mg/dL   Hgb urine dipstick MODERATE (A) NEGATIVE   Bilirubin Urine NEGATIVE NEGATIVE   Ketones, ur NEGATIVE NEGATIVE mg/dL   Protein, ur >=300 (A) NEGATIVE mg/dL   Nitrite NEGATIVE  NEGATIVE   Leukocytes,Ua NEGATIVE NEGATIVE   RBC / HPF 6-10 0 - 5 RBC/hpf   WBC, UA 6-10 0 - 5 WBC/hpf   Bacteria, UA NONE SEEN NONE SEEN   Mucus PRESENT     Comment: Performed at [facility], [place] [address], [place], [place] [address]  POC occult blood, ED     Status: None   Collection Time: 02/25/20 12:57 PM  Result Value Ref Range   Fecal Occult Bld NEGATIVE NEGATIVE  CBG monitoring, ED  Status: None   Collection Time: 02/25/20  4:46 PM  Result Value Ref Range   Glucose-Capillary 91 70 - 99 mg/dL    Comment: Glucose reference range applies only to samples taken after fasting for at least 8 hours.  Creatinine, urine, random     Status: None   Collection Time: 02/25/20  5:56 PM  Result Value Ref Range   Creatinine, Urine 100.65 mg/dL    Comment: Performed at [facility], [place] [address], [place], [place] [address]  Sodium, urine, random     Status: None   Collection Time: 02/25/20  5:56 PM  Result Value Ref Range   Sodium, Ur 48 mmol/L    Comment: Performed at [facility], [place] [address], [place], [place] [address]   DG Chest Portable 1 View  Result Date: 02/25/2020 CLINICAL DATA:  Dyspnea, vomiting EXAM: PORTABLE CHEST 1 VIEW COMPARISON:  12/02/2019 FINDINGS: The lungs are well expanded and are symmetric. Trace interstitial pulmonary edema within the lung bases persists, similar to that noted on prior examination. Mild cardiomegaly, however, has slightly progressed since prior exam. No pneumothorax or pleural effusion. No acute bone abnormality. IMPRESSION: Mild progressive cardiomegaly. Stable trace interstitial pulmonary edema, possibly cardiogenic in nature. Electronically Signed   By: Fidela Salisbury MD   On: 02/25/2020 13:09    Pending Labs Unresulted Labs (From admission, onward) Comment          Start     Ordered   03/03/20 0500  Creatinine, serum  (enoxaparin (LOVENOX)    CrCl >/= 30 ml/min)  Weekly,   R      Comments: while on enoxaparin therapy    02/25/20 1558   02/26/20 0500  CBC  Tomorrow morning,   R        02/25/20 1558   02/26/20 4034  Basic metabolic panel  Tomorrow morning,   R        02/25/20 1558   02/25/20 1559  HIV Antibody (routine testing w rflx)  (HIV Antibody (Routine testing w reflex) panel)  Once,   STAT        02/25/20 1558   02/25/20 1559  Hemoglobin A1c  Once,   STAT       Comments: To assess prior glycemic control    02/25/20 1558          Vitals/Pain Today's Vitals   02/25/20 1741 02/25/20 1745 02/25/20 1857 02/25/20 2011  BP: (!) 167/106 (!) 158/99 (!) 150/95 (!) 157/99  Pulse: 92 93 93 87  Resp:  18 16 20   Temp:    98.6 F (37 C)  TempSrc:    Oral  SpO2:  99% 100% 100%  Weight:      Height:      PainSc:        Isolation Precautions No active isolations  Medications Medications  hydrALAZINE (APRESOLINE) injection 10 mg (has no administration in time range)  isosorbide-hydrALAZINE (BIDIL) 20-37.5 MG per tablet 1 tablet (has no administration in time range)  enoxaparin (LOVENOX) injection 40 mg (40 mg Subcutaneous Given 02/25/20 1743)  0.9 %  sodium chloride infusion ( Intravenous New Bag/Given 02/25/20 1747)  acetaminophen (TYLENOL) tablet 650 mg (has no administration in time range)    Or  acetaminophen (TYLENOL) suppository 650 mg (has no administration in time range)  polyethylene glycol (MIRALAX / GLYCOLAX) packet 17 g (has no administration in time range)  ondansetron (ZOFRAN) tablet 4 mg (has no administration in time range)  Or  ondansetron (ZOFRAN) injection 4 mg (has no administration in time range)  albuterol (PROVENTIL) (2.5 MG/3ML) 0.083% nebulizer solution 2.5 mg (has no administration in time range)  insulin detemir (LEVEMIR) injection 5 Units (has no administration in time range)  insulin aspart (novoLOG) injection 0-9 Units (0 Units Subcutaneous Not Given 02/25/20 1722)  insulin aspart (novoLOG) injection 2 Units (2 Units  Subcutaneous Given 02/25/20 1743)  carvedilol (COREG) tablet 6.25 mg (6.25 mg Oral Given 02/25/20 1741)  ondansetron (ZOFRAN-ODT) disintegrating tablet 4 mg (4 mg Oral Given 02/25/20 1220)  amLODipine (NORVASC) tablet 10 mg (10 mg Oral Given 02/25/20 1220)  sodium chloride 0.9 % bolus 1,000 mL (0 mLs Intravenous Stopped 02/25/20 1538)  labetalol (NORMODYNE) injection 20 mg (20 mg Intravenous Given 02/25/20 1420)    Mobility walks

## 2020-02-26 ENCOUNTER — Inpatient Hospital Stay (HOSPITAL_COMMUNITY): Payer: BC Managed Care – PPO

## 2020-02-26 DIAGNOSIS — E860 Dehydration: Secondary | ICD-10-CM

## 2020-02-26 LAB — BASIC METABOLIC PANEL
Anion gap: 7 (ref 5–15)
BUN: 47 mg/dL — ABNORMAL HIGH (ref 6–20)
CO2: 23 mmol/L (ref 22–32)
Calcium: 7.5 mg/dL — ABNORMAL LOW (ref 8.9–10.3)
Chloride: 109 mmol/L (ref 98–111)
Creatinine, Ser: 4.28 mg/dL — ABNORMAL HIGH (ref 0.61–1.24)
GFR calc Af Amer: 19 mL/min — ABNORMAL LOW (ref >60)
GFR calc non Af Amer: 17 mL/min — ABNORMAL LOW (ref >60)
Glucose, Bld: 105 mg/dL — ABNORMAL HIGH (ref 70–99)
Potassium: 3.9 mmol/L (ref 3.5–5.1)
Sodium: 139 mmol/L (ref 135–145)

## 2020-02-26 LAB — HEPATITIS C ANTIBODY: HCV Ab: NONREACTIVE

## 2020-02-26 LAB — GLUCOSE, CAPILLARY
Glucose-Capillary: 87 mg/dL (ref 70–99)
Glucose-Capillary: 94 mg/dL (ref 70–99)
Glucose-Capillary: 116 mg/dL — ABNORMAL HIGH (ref 70–99)
Glucose-Capillary: 111 mg/dL — ABNORMAL HIGH (ref 70–99)

## 2020-02-26 LAB — CBC
HCT: 22.3 % — ABNORMAL LOW (ref 39.0–52.0)
Hemoglobin: 7 g/dL — ABNORMAL LOW (ref 13.0–17.0)
MCH: 25.6 pg — ABNORMAL LOW (ref 26.0–34.0)
MCHC: 31.4 g/dL (ref 30.0–36.0)
MCV: 81.7 fL (ref 80.0–100.0)
Platelets: 263 10*3/uL (ref 150–400)
RBC: 2.73 MIL/uL — ABNORMAL LOW (ref 4.22–5.81)
RDW: 15.1 % (ref 11.5–15.5)
WBC: 7.6 10*3/uL (ref 4.0–10.5)
nRBC: 0 % (ref 0.0–0.2)

## 2020-02-26 LAB — IRON AND TIBC
Iron: 40 ug/dL — ABNORMAL LOW (ref 45–182)
Saturation Ratios: 17 % — ABNORMAL LOW (ref 17.9–39.5)
TIBC: 240 ug/dL — ABNORMAL LOW (ref 250–450)
UIBC: 200 ug/dL

## 2020-02-26 LAB — HEPATITIS B CORE ANTIBODY, TOTAL: Hep B Core Total Ab: NONREACTIVE

## 2020-02-26 LAB — RETICULOCYTES
Immature Retic Fract: 15.7 % (ref 2.3–15.9)
RBC.: 2.85 MIL/uL — ABNORMAL LOW (ref 4.22–5.81)
Retic Count, Absolute: 30.5 10*3/uL (ref 19.0–186.0)
Retic Ct Pct: 1.1 % (ref 0.4–3.1)

## 2020-02-26 LAB — RAPID URINE DRUG SCREEN, HOSP PERFORMED
Amphetamines: NOT DETECTED
Barbiturates: NOT DETECTED
Benzodiazepines: NOT DETECTED
Cocaine: NOT DETECTED
Opiates: NOT DETECTED
Tetrahydrocannabinol: NOT DETECTED

## 2020-02-26 LAB — VITAMIN B12: Vitamin B-12: 506 pg/mL (ref 180–914)

## 2020-02-26 LAB — LACTATE DEHYDROGENASE: LDH: 251 U/L — ABNORMAL HIGH (ref 98–192)

## 2020-02-26 LAB — FERRITIN: Ferritin: 262 ng/mL (ref 24–336)

## 2020-02-26 LAB — PROTEIN / CREATININE RATIO, URINE
Creatinine, Urine: 128.02 mg/dL
Protein Creatinine Ratio: 2.86 mg/mg{Cre} — ABNORMAL HIGH (ref 0.00–0.15)
Total Protein, Urine: 366 mg/dL

## 2020-02-26 LAB — PREPARE RBC (CROSSMATCH)

## 2020-02-26 LAB — HEPATITIS B SURFACE ANTIGEN: Hepatitis B Surface Ag: NONREACTIVE

## 2020-02-26 LAB — FOLATE: Folate: 7.3 ng/mL (ref >5.9)

## 2020-02-26 MED ORDER — SODIUM CHLORIDE 0.9 % IV SOLN: INTRAVENOUS | Status: DC

## 2020-02-26 MED ORDER — SODIUM CHLORIDE 0.9% IV SOLUTION: Freq: Once | INTRAVENOUS | Status: AC

## 2020-02-26 MED ORDER — PROMETHAZINE HCL 25 MG/ML IJ SOLN: 12.5000 mg | Freq: Four times a day (QID) | INTRAMUSCULAR | Status: DC | PRN

## 2020-02-26 MED ORDER — HYDROMORPHONE HCL 1 MG/ML IJ SOLN
0.5000 mg | Freq: Once | INTRAMUSCULAR | Status: DC
  Filled 2020-02-26: qty 0.5

## 2020-02-26 MED ORDER — FUROSEMIDE 10 MG/ML IJ SOLN
40.0000 mg | Freq: Two times a day (BID) | INTRAMUSCULAR | Status: DC
  Administered 2020-02-26 – 2020-02-27 (×2): 40 mg via INTRAVENOUS
  Filled 2020-02-26 (×2): qty 4

## 2020-02-26 NOTE — Consult Note (Signed)
Nephrology Consult  [facility]  Requesting provider: Georgette Shell, MD Reason for consult: [name], ckd   Assessment/Recommendations: [name] is a/an 34 y.o. male with a past medical history notable for AKI    AKI, possible underlying CKD3: -CKD can likely be secondary to diabetic nephropathy however I am concerned that his AKI is secondary to hypertensive emergency vs collapsing fsgs from HTN +/- diabetes -significant proteinuria on UA, will quantify with UPC and UACR -renal ultrasound -give hematuria will proceed with ANA and ANCA -if no improvement in renal function, will proceed with native renal biopsy in the next day or so with IR. Informed the patient of the possibility of needing this and will think about  -Continue to monitor daily Cr, Dose meds for GFR<15 -Monitor Daily I/Os, Daily weight  -Maintain MAP>65 for optimal renal perfusion.  -Agree with holding ACE-I, avoid further nephrotoxins including NSAIDS, Morphine.  Unless absolutely necessary, avoid CT with contrast and/or MRI with gadolinium.     Pulmonary edema -on lasix 40mg  iv bid  Proteinuria, workup as above  Hematuria -unknown etiology, will proceed with anca and ana serologies  Hypertension, improved: -on coreg and bidil as well as lasix 40mg  iv bid -can uptitrate coreg to 12.5mg  bid as the next step if needed  Worsening anemia (normocytic) Given hypertensive, will pursue hemolytic workup. plts stable -Transfuse for Hgb<7 g/dL  Uncontrolled Diabetes Mellitus Type 2 with Hyperglycemia -mgmt per primary service  Recommendations conveyed to primary service.   [facility] 02/26/2020 2:34 PM _____________________________________________________________________________________   History of Present Illness: [name] is a 34 y.o. black male with a past medical history of longstanding/uncontrolled diabetes mellitus, diabetic retinopathy, recent  diagnosis of hypertension, medication noncompliance/nonadherence who presents to [facility] with nausea/vomiting for the last week prior to admission along with some "hunger pains".  He had a similar presentation back in May for which she had acute kidney injury and hypertensive emergency due to noncompliance and at that time his creatinine was around 2.  He did have an echocardiogram done back then which revealed an EF of 55 to 60% with moderate left ventricular hypertrophy.  His renal ultrasound at that time also showed asymmetrically enlarged left kidney and Diffusely increased echogenicity of renal parenchyma likely secondary to medical renal disease.asdf No NSAID use. No family history of AI disease or kidney diseases. Does report some orthopnea and DOE. Denies hematuria, rash, epistaxis, hemoptysis, changes in vision, flank pain, diarrhea.   Medications:  Current Facility-Administered Medications  Medication Dose Route Frequency Provider Last Rate Last Admin  . 0.9 %  sodium chloride infusion (Manually program via Guardrails IV Fluids)   Intravenous Once Georgette Shell, MD      . acetaminophen (TYLENOL) tablet 650 mg  650 mg Oral Q6H PRN Charlynne Cousins, MD       Or  . acetaminophen (TYLENOL) suppository 650 mg  650 mg Rectal Q6H PRN Charlynne Cousins, MD      . albuterol (PROVENTIL) (2.5 MG/3ML) 0.083% nebulizer solution 2.5 mg  2.5 mg Nebulization Q2H PRN Charlynne Cousins, MD      . carvedilol (COREG) tablet 6.25 mg  6.25 mg Oral BID WC Charlynne Cousins, MD   6.25 mg at 02/26/20 0924  . enoxaparin (LOVENOX) injection 40 mg  40 mg Subcutaneous Q24H Charlynne Cousins, MD   40 mg at 02/25/20 1743  . furosemide (LASIX) injection 40 mg  40 mg Intravenous BID Landis Gandy  G, MD      . hydrALAZINE (APRESOLINE) injection 10 mg  10 mg Intravenous Q6H PRN Charlynne Cousins, MD      . insulin aspart (novoLOG) injection 0-9 Units  0-9 Units Subcutaneous TID WC Charlynne Cousins, MD      . insulin aspart (novoLOG) injection 2 Units  2 Units Subcutaneous TID WC Charlynne Cousins, MD   2 Units at 02/25/20 1743  . insulin detemir (LEVEMIR) injection 5 Units  5 Units Subcutaneous BID Charlynne Cousins, MD   5 Units at 02/26/20 [phone]  . isosorbide-hydrALAZINE (BIDIL) 20-37.5 MG per tablet 1 tablet  1 tablet Oral TID Charlynne Cousins, MD   1 tablet at 02/26/20 [phone]  . ondansetron (ZOFRAN) tablet 4 mg  4 mg Oral Q6H PRN Charlynne Cousins, MD       Or  . ondansetron [facility]) injection 4 mg  4 mg Intravenous Q6H PRN Charlynne Cousins, MD      . polyethylene glycol [facility] Floria Raveling) packet 17 g  17 g Oral Daily PRN Charlynne Cousins, MD         ALLERGIES Patient has no known allergies.  MEDICAL HISTORY Past Medical History:  Diagnosis Date  . Diabetes mellitus   . High cholesterol   . Hypertension      SOCIAL HISTORY Social History   Socioeconomic History  . Marital status: Single    Spouse name: Not on file  . Number of children: Not on file  . Years of education: Not on file  . Highest education level: Not on file  Occupational History  . Not on file  Tobacco Use  . Smoking status: Never Smoker  . Smokeless tobacco: Never Used  Vaping Use  . Vaping Use: Never used  Substance and Sexual Activity  . Alcohol use: Yes  . Drug use: No  . Sexual activity: Yes  Other Topics Concern  . Not on file  Social History Narrative  . Not on file   Social Determinants of Health   Financial Resource Strain:   . Difficulty of Paying Living Expenses:   Food Insecurity:   . Worried About Charity fundraiser in the Last Year:   . Arboriculturist in the Last Year:   Transportation Needs:   . Film/video editor (Medical):   Marland Kitchen Lack of Transportation (Non-Medical):   Physical Activity:   . Days of Exercise per Week:   . Minutes of Exercise per Session:   Stress:   . Feeling of Stress :   Social Connections:   . Frequency of  Communication with Friends and Family:   . Frequency of Social Gatherings with Friends and Family:   . Attends Religious Services:   . Active Member of Clubs or Organizations:   . Attends Archivist Meetings:   Marland Kitchen Marital Status:   Intimate Partner Violence:   . Fear of Current or Ex-Partner:   . Emotionally Abused:   Marland Kitchen Physically Abused:   . Sexually Abused:      FAMILY HISTORY Family History  Problem Relation Age of Onset  . Cancer Mother   . Stroke Father   . Diabetes Father   . Hypertension Father      No family history of kidney disease  Review of Systems: 12 systems reviewed Otherwise as per HPI, all other systems reviewed and negative  Physical Exam: Vitals:   02/26/20 0921 02/26/20 1345  BP: (!) 165/110 138/86  Pulse: 87 81  Resp: 16 18  Temp: 98.5 F (36.9 C) 97.8 F (36.6 C)  SpO2: 99% 96%   Total I/O In: 1288.8 [P.O.:480; I.V.:808.8] Out: -   Intake/Output Summary (Last 24 hours) at 02/26/2020 1434 Last data filed at 02/26/2020 1400 Gross per 24 hour  Intake 3406.26 ml  Output 0 ml  Net 3406.26 ml   General: well-appearing, no acute distress HEENT: anicteric sclera, oropharynx clear without lesions, MMM, no oral ulcers CV: regular rate, normal rhythm, no murmurs, no gallops, no rubs Lungs: clear to auscultation bilaterally, normal work of breathing Abd: soft, non-tender, non-distended Skin: no visible lesions or rashes Psych: alert, engaged, appropriate mood and affect Musculoskeletal: trace edema b/l LEs Neuro: normal speech, no gross focal deficits, aaox3  Test Results Reviewed Lab Results  Component Value Date   NA 139 02/26/2020   K 3.9 02/26/2020   CL 109 02/26/2020   CO2 23 02/26/2020   BUN 47 (H) 02/26/2020   CREATININE 4.28 (H) 02/26/2020   CALCIUM 7.5 (L) 02/26/2020   ALBUMIN 2.9 (L) 02/25/2020   PHOS 4.0 10/12/2007     I have reviewed all relevant outside healthcare records related to the patient's kidney injury.

## 2020-02-26 NOTE — Progress Notes (Addendum)
PROGRESS NOTE    [name]  NKN:[id number] DOB: 1986-01-18 DOA: 02/25/2020 PCP: Patient, No Pcp Per    Brief Narrative:[name] is a 34 y.o. male past medical history of hypertension and diabetes he was recently discharged from the hospital on 12/05/2019 for acute kidney injury and hypertensive emergency due to noncompliance who comes in to the hospital because he has been vomiting over the past week at least five or six times he denies any diarrhea abdominal pain or chest pain. Patient has not seen a primary care doctor since discharge he relates he quit taking his medication as it was making him feel bad.  In the ED: He was found to have a blood pressure of 190/129 the satting greater 94% on room air, weight, UA negative chest x-ray showed a medically stable interstitial pulmonary edema.   Assessment & Plan:   Active Problems:   AKI (acute kidney injury) ([place])   Essential hypertension   Type 2 diabetes mellitus without complication ([place])   Hypertensive urgency  #1 AKI on CKD stage IIIb likely-DC IV fluids due to elevated BNP and chest x-ray findings consistent with stable pulmonary edema. Consulted nephrology. He is on 2 L of oxygen saturating 96%. Patient continues to complain of nausea and decreased appetite which could be related to AKI.   Added a urine drug screen. Renal ultrasound May 2021 showed consistent with medical renal disease.  #2 hypertensive urgency he is on hydralazine 50 mg every 8 and Norvasc 10 mg at home. His blood pressure is 138/86 currently on Coreg 6.25 mg twice a day, BiDil 3 times a day, added Lasix 40 mg twice a day.  He is positive by 2.6 L.  #3 type 2 diabetes he is on Levemir insulin 30 units daily at home.  Currently on Levemir 5 units and NovoLog prior to meals.  Check A1c.  CBG (last 3)  Recent Labs    02/25/20 2105 02/26/20 0749 02/26/20 1138  GLUCAP 110* 87 94   #4 normocytic anemia hemoglobin 7.0 down from 9.0 in May 2021.   This is likely related to CKD. Will check FOBT and anemia panel  and arrange for 1 unit of blood transfusion. Patient received blood transfusion during his recent hospital stay in May 2021.  Estimated body mass index is 31.87 kg/m as calculated from the following:   Height as of this encounter: 6' (1.829 m).   Weight as of this encounter: 106.6 kg.  DVT prophylaxis: Lovenox  code Status: Full code Family Communication: None at bedside Disposition Plan:  Status is: Inpatient  Dispo: The patient is from: Home              Anticipated d/c is to: Home              Anticipated d/c date is: 2 days              Patient currently is not medically stable to d/c.  He is admitted with hypertensive urgency and AKI still need to be on hydration to resolve AKI.  Continues to be nauseated   Consultants: None  Procedures: None Antimicrobials: None Subjective: Patient is resting in bed continues to complain of nausea with decreased p.o. intake decreased appetite. C/o sob  Objective: Vitals:   02/25/20 2229 02/26/20 0212 02/26/20 0517 02/26/20 0921  BP: (!) 154/91 (!) 140/92 140/85 (!) 165/110  Pulse: 93 89 83 87  Resp: 18 20 18 16   Temp: 98.5 F (36.9 C) 98.9  F (37.2 C) 98.5 F (36.9 C) 98.5 F (36.9 C)  TempSrc: Oral Oral Oral Oral  SpO2: 95% 100% 94% 99%  Weight:      Height:        Intake/Output Summary (Last 24 hours) at 02/26/2020 1326 Last data filed at 02/26/2020 1000 Gross per 24 hour  Intake 2668.43 ml  Output 0 ml  Net 2668.43 ml   Filed Weights   02/25/20 1227  Weight: 106.6 kg    Examination:  General exam: Appears calm and comfortable  Respiratory system: Crackles to auscultation. Respiratory effort normal. Cardiovascular system: S1 & S2 heard, RRR. No JVD, murmurs, rubs, gallops or clicks. No pedal edema. Gastrointestinal system: Abdomen is nondistended, soft and nontender. No organomegaly or masses felt. Normal bowel sounds heard. Central nervous system:  Alert and oriented. No focal neurological deficits. Extremities: Symmetric 5 x 5 power. Skin: No rashes, lesions or ulcers Psychiatry: Judgement and insight appear normal. Mood & affect appropriate.     Data Reviewed: I have personally reviewed following labs and imaging studies  CBC: Recent Labs  Lab 02/25/20 1127 02/26/20 0524  WBC 7.7 7.6  HGB 7.8* 7.0*  HCT 24.7* 22.3*  MCV 80.5 81.7  PLT 301 017   Basic Metabolic Panel: Recent Labs  Lab 02/25/20 1127 02/26/20 0524  NA 140 139  K 4.2 3.9  CL 105 109  CO2 25 23  GLUCOSE 119* 105*  BUN 55* 47*  CREATININE 4.26* 4.28*  CALCIUM 8.1* 7.5*   GFR: Estimated Creatinine Clearance: 30.4 mL/min (A) (by C-G formula based on SCr of 4.28 mg/dL (H)). Liver Function Tests: Recent Labs  Lab 02/25/20 1127  AST 17  ALT 18  ALKPHOS 70  BILITOT 0.8  PROT 6.5  ALBUMIN 2.9*   Recent Labs  Lab 02/25/20 1127  LIPASE 21   No results for input(s): AMMONIA in the last 168 hours. Coagulation Profile: No results for input(s): INR, PROTIME in the last 168 hours. Cardiac Enzymes: No results for input(s): CKTOTAL, CKMB, CKMBINDEX, TROPONINI in the last 168 hours. BNP (last 3 results) No results for input(s): PROBNP in the last 8760 hours. HbA1C: Recent Labs    02/25/20 1730  HGBA1C 6.5*   CBG: Recent Labs  Lab 02/25/20 1646 02/25/20 2105 02/26/20 0749 02/26/20 1138  GLUCAP 91 110* 87 94   Lipid Profile: No results for input(s): CHOL, HDL, LDLCALC, TRIG, CHOLHDL, LDLDIRECT in the last 72 hours. Thyroid Function Tests: No results for input(s): TSH, T4TOTAL, FREET4, T3FREE, THYROIDAB in the last 72 hours. Anemia Panel: No results for input(s): VITAMINB12, FOLATE, FERRITIN, TIBC, IRON, RETICCTPCT in the last 72 hours. Sepsis Labs: No results for input(s): PROCALCITON, LATICACIDVEN in the last 168 hours.  Recent Results (from the past 240 hour(s))  SARS Coronavirus 2 by RT PCR (hospital order, performed in [facility] hospital lab) Nasopharyngeal Nasopharyngeal Swab     Status: None   Collection Time: 02/25/20 11:49 AM   Specimen: Nasopharyngeal Swab  Result Value Ref Range Status   SARS Coronavirus 2 NEGATIVE NEGATIVE Final    Comment: (NOTE) SARS-CoV-2 target nucleic acids are NOT DETECTED.  The SARS-CoV-2 RNA is generally detectable in upper and lower respiratory specimens during the acute phase of infection. The lowest concentration of SARS-CoV-2 viral copies this assay can detect is 250 copies / mL. A negative result does not preclude SARS-CoV-2 infection and should not be used as the sole basis for treatment or other patient management decisions.  A negative result  may occur with improper specimen collection / handling, submission of specimen other than nasopharyngeal swab, presence of viral mutation(s) within the areas targeted by this assay, and inadequate number of viral copies (<250 copies / mL). A negative result must be combined with clinical observations, patient history, and epidemiological information.  Fact Sheet for Patients:   StrictlyIdeas.no  Fact Sheet for Healthcare Providers: BankingDealers.co.za  This test is not yet approved or  cleared by the [place] FDA and has been authorized for detection and/or diagnosis of SARS-CoV-2 by FDA under an Emergency Use Authorization (EUA).  This EUA will remain in effect (meaning this test can be used) for the duration of the COVID-19 declaration under Section 564(b)(1) of the Act, 21 U.S.C. section 360bbb-3(b)(1), unless the authorization is terminated or revoked sooner.  Performed at [facility], [place] [address] [place], [place] [address]          Radiology Studies: DG Chest Portable 1 View  Result Date: 02/25/2020 CLINICAL DATA:  Dyspnea, vomiting EXAM: PORTABLE CHEST 1 VIEW COMPARISON:  12/02/2019 FINDINGS: The lungs are well expanded and are  symmetric. Trace interstitial pulmonary edema within the lung bases persists, similar to that noted on prior examination. Mild cardiomegaly, however, has slightly progressed since prior exam. No pneumothorax or pleural effusion. No acute bone abnormality. IMPRESSION: Mild progressive cardiomegaly. Stable trace interstitial pulmonary edema, possibly cardiogenic in nature. Electronically Signed   By: Fidela Salisbury MD   On: 02/25/2020 13:09        Scheduled Meds: . carvedilol  6.25 mg Oral BID WC  . enoxaparin (LOVENOX) injection  40 mg Subcutaneous Q24H  . insulin aspart  0-9 Units Subcutaneous TID WC  . insulin aspart  2 Units Subcutaneous TID WC  . insulin detemir  5 Units Subcutaneous BID  . isosorbide-hydrALAZINE  1 tablet Oral TID   Continuous Infusions: . sodium chloride 125 mL/hr at 02/26/20 0942     LOS: 1 day     Georgette Shell, MD 02/26/2020, 1:26 PM

## 2020-02-27 LAB — PROTEIN ELECTROPHORESIS, SERUM
A/G Ratio: 0.8 (ref 0.7–1.7)
Albumin ELP: 2.4 g/dL — ABNORMAL LOW (ref 2.9–4.4)
Alpha-1-Globulin: 0.2 g/dL (ref 0.0–0.4)
Alpha-2-Globulin: 0.8 g/dL (ref 0.4–1.0)
Beta Globulin: 0.8 g/dL (ref 0.7–1.3)
Gamma Globulin: 1.2 g/dL (ref 0.4–1.8)
Globulin, Total: 3.1 g/dL (ref 2.2–3.9)
Total Protein ELP: 5.5 g/dL — ABNORMAL LOW (ref 6.0–8.5)

## 2020-02-27 LAB — CBC
HCT: 23.9 % — ABNORMAL LOW (ref 39.0–52.0)
Hemoglobin: 7.6 g/dL — ABNORMAL LOW (ref 13.0–17.0)
MCH: 26 pg (ref 26.0–34.0)
MCHC: 31.8 g/dL (ref 30.0–36.0)
MCV: 81.8 fL (ref 80.0–100.0)
Platelets: 267 10*3/uL (ref 150–400)
RBC: 2.92 MIL/uL — ABNORMAL LOW (ref 4.22–5.81)
RDW: 15.4 % (ref 11.5–15.5)
WBC: 8 10*3/uL (ref 4.0–10.5)
nRBC: 0 % (ref 0.0–0.2)

## 2020-02-27 LAB — GLUCOSE, CAPILLARY
Glucose-Capillary: 76 mg/dL (ref 70–99)
Glucose-Capillary: 122 mg/dL — ABNORMAL HIGH (ref 70–99)
Glucose-Capillary: 81 mg/dL (ref 70–99)
Glucose-Capillary: 141 mg/dL — ABNORMAL HIGH (ref 70–99)

## 2020-02-27 LAB — TYPE AND SCREEN
ABO/RH(D): O POS
Antibody Screen: NEGATIVE
Unit division: 0

## 2020-02-27 LAB — BPAM RBC
Blood Product Expiration Date: 202109152359
ISSUE DATE / TIME: 202108171856
Unit Type and Rh: 5100

## 2020-02-27 LAB — ANA W/REFLEX IF POSITIVE: Anti Nuclear Antibody (ANA): NEGATIVE

## 2020-02-27 LAB — RENAL FUNCTION PANEL
Albumin: 2.6 g/dL — ABNORMAL LOW (ref 3.5–5.0)
Anion gap: 8 (ref 5–15)
BUN: 47 mg/dL — ABNORMAL HIGH (ref 6–20)
CO2: 22 mmol/L (ref 22–32)
Calcium: 7.7 mg/dL — ABNORMAL LOW (ref 8.9–10.3)
Chloride: 109 mmol/L (ref 98–111)
Creatinine, Ser: 4.42 mg/dL — ABNORMAL HIGH (ref 0.61–1.24)
GFR calc Af Amer: 19 mL/min — ABNORMAL LOW (ref >60)
GFR calc non Af Amer: 16 mL/min — ABNORMAL LOW (ref >60)
Glucose, Bld: 81 mg/dL (ref 70–99)
Phosphorus: 4.7 mg/dL — ABNORMAL HIGH (ref 2.5–4.6)
Potassium: 3.8 mmol/L (ref 3.5–5.1)
Sodium: 139 mmol/L (ref 135–145)

## 2020-02-27 LAB — MICROALBUMIN / CREATININE URINE RATIO
Creatinine, Urine: 106.4 mg/dL
Microalb Creat Ratio: 2189 mg/g creat — ABNORMAL HIGH (ref 0–29)
Microalb, Ur: 2328.8 ug/mL — ABNORMAL HIGH

## 2020-02-27 LAB — KAPPA/LAMBDA LIGHT CHAINS
Kappa free light chain: 131.2 mg/L — ABNORMAL HIGH (ref 3.3–19.4)
Kappa, lambda light chain ratio: 2.96 — ABNORMAL HIGH (ref 0.26–1.65)
Lambda free light chains: 44.3 mg/L — ABNORMAL HIGH (ref 5.7–26.3)

## 2020-02-27 LAB — ANTI-DNA ANTIBODY, DOUBLE-STRANDED: ds DNA Ab: <1 IU/mL (ref 0–9)

## 2020-02-27 LAB — ANCA TITERS
Atypical P-ANCA titer: <1:20 {titer}
C-ANCA: <1:20 {titer}
P-ANCA: <1:20 {titer}

## 2020-02-27 LAB — HAPTOGLOBIN: Haptoglobin: 170 mg/dL (ref 17–317)

## 2020-02-27 LAB — HEMOGLOBIN A1C
Hgb A1c MFr Bld: 6 % — ABNORMAL HIGH (ref 4.8–5.6)
Mean Plasma Glucose: 125.5 mg/dL

## 2020-02-27 LAB — HEPATITIS B SURFACE ANTIBODY, QUANTITATIVE: Hep B S AB Quant (Post): 40.8 m[IU]/mL (ref >9.9)

## 2020-02-27 MED ORDER — ENOXAPARIN SODIUM 30 MG/0.3ML ~~LOC~~ SOLN
30.0000 mg | SUBCUTANEOUS | Status: DC
  Filled 2020-02-27: qty 0.3

## 2020-02-27 NOTE — Consult Note (Signed)
Chief Complaint: AKI. Request is for native renal biopsy  Referring Physician(s): Dr. Loyal Gambler  Supervising Physician: Jacqulynn Cadet  Patient Status: [facility] - In-pt  History of Present Illness: [name] is a 34 y.o. male History of HTN, DM. Discharged from the hospital on 5.26.21. for hypertensive emergency and AKI. Presented to the ED with vomiting X 1 week. Found to be in hypertensive urgency with AKI with proteinuria and hematuria. US renal from 8.17.21 reads Diffusely increased renal echogenicity consistent with chronic medical renal disease. Prominent sized kidneys, with left larger than right, also seen on prior exam Team is requesting native kidney biopsy for further evaluation of the patient's AKI.Marland Kitchen  Past Medical History:  Diagnosis Date  . Diabetes mellitus   . High cholesterol   . Hypertension     Past Surgical History:  Procedure Laterality Date  . FRACTURE SURGERY    . I & D EXTREMITY Left 07/16/2014   Procedure: IRRIGATION AND DEBRIDEMENT EXTREMITY/LEFT INDEX FINGER;  Surgeon: Leanora Cover, MD;  Location: [place];  Service: Orthopedics;  Laterality: Left;    Allergies: Patient has no known allergies.  Medications: Prior to Admission medications   Medication Sig Start Date End Date Taking? Authorizing Provider  insulin detemir (LEVEMIR) 100 UNIT/ML injection Inject 30 Units into the skin daily.   Yes [provider]  amLODipine (NORVASC) 10 MG tablet Take 1 tablet (10 mg total) by mouth daily. Patient not taking: Reported on 02/25/2020 12/05/19   Patrecia Pour, MD  ferrous sulfate 325 (65 FE) MG tablet Take 1 tablet (325 mg total) by mouth daily with breakfast. Patient not taking: Reported on 02/25/2020 12/05/19   Patrecia Pour, MD  hydrALAZINE (APRESOLINE) 50 MG tablet Take 1 tablet (50 mg total) by mouth every 8 (eight) hours. Patient not taking: Reported on 02/25/2020 12/05/19   Patrecia Pour, MD     Family History  Problem Relation Age of Onset    . Cancer Mother   . Stroke Father   . Diabetes Father   . Hypertension Father     Social History   Socioeconomic History  . Marital status: Single    Spouse name: Not on file  . Number of children: Not on file  . Years of education: Not on file  . Highest education level: Not on file  Occupational History  . Not on file  Tobacco Use  . Smoking status: Never Smoker  . Smokeless tobacco: Never Used  Vaping Use  . Vaping Use: Never used  Substance and Sexual Activity  . Alcohol use: Yes  . Drug use: No  . Sexual activity: Yes  Other Topics Concern  . Not on file  Social History Narrative  . Not on file   Social Determinants of Health   Financial Resource Strain:   . Difficulty of Paying Living Expenses:   Food Insecurity:   . Worried About Charity fundraiser in the Last Year:   . Arboriculturist in the Last Year:   Transportation Needs:   . Film/video editor (Medical):   Marland Kitchen Lack of Transportation (Non-Medical):   Physical Activity:   . Days of Exercise per Week:   . Minutes of Exercise per Session:   Stress:   . Feeling of Stress :   Social Connections:   . Frequency of Communication with Friends and Family:   . Frequency of Social Gatherings with Friends and Family:   . Attends Religious Services:   .  Active Member of Clubs or Organizations:   . Attends Archivist Meetings:   Marland Kitchen Marital Status:     Review of Systems: A 12 point ROS discussed and pertinent positives are indicated in the HPI above.  All other systems are negative.  Review of Systems  Constitutional: Positive for fatigue. Negative for fever.  HENT: Negative for congestion.   Respiratory: Negative for cough and shortness of breath.   Cardiovascular: Negative for chest pain.  Gastrointestinal: Negative for abdominal pain.  Neurological: Negative for headaches.  Psychiatric/Behavioral: Negative for behavioral problems and confusion.    Vital Signs: BP (!) 147/85 (BP Location:  Left Arm)   Pulse 84   Temp 98.3 F (36.8 C)   Resp 16   Ht 6' (1.829 m)   Wt 235 lb (106.6 kg)   SpO2 99%   BMI 31.87 kg/m   Physical Exam Vitals and nursing note reviewed.  Constitutional:      Appearance: He is well-developed.  HENT:     Head: Normocephalic.  Cardiovascular:     Rate and Rhythm: Normal rate and regular rhythm.     Heart sounds: Normal heart sounds.  Pulmonary:     Effort: Pulmonary effort is normal.     Breath sounds: Normal breath sounds.  Musculoskeletal:        General: Normal range of motion.     Cervical back: Normal range of motion.  Skin:    General: Skin is dry.  Neurological:     Mental Status: He is alert and oriented to person, place, and time.     Imaging: DG Abd 1 View  Result Date: 02/26/2020 CLINICAL DATA:  34 year old male with abdominal distension, nausea vomiting. EXAM: ABDOMEN - 1 VIEW COMPARISON:  Portable chest radiograph yesterday. FINDINGS: Portable AP supine view at 1638 hours. Non obstructed bowel gas pattern. External artifact projects over the pelvis on the initial view, not present on image #2. Incidental pelvic phleboliths. Normal abdominal and pelvic visceral contours. No osseous abnormality identified. IMPRESSION: Normal bowel gas pattern. Electronically Signed   By: Genevie Ann M.D.   On: 02/26/2020 16:59   US RENAL  Result Date: 02/26/2020 CLINICAL DATA:  Acute kidney injury. EXAM: RENAL / URINARY TRACT ULTRASOUND COMPLETE COMPARISON:  Renal ultrasound 12/02/2019 FINDINGS: Right Kidney: Renal measurements: 13.9 x 5.0 x 7.2 cm = volume: 260 mL. Diffusely increased renal echogenicity. No mass or hydronephrosis visualized. Suspect an area of cortical scarring in the mid lower kidney. Left Kidney: Renal measurements: 17.6 x 7.0 x 7.3 cm = volume: 474 mL. Diffusely increased renal echogenicity. No mass or hydronephrosis visualized. Bladder: Appears normal for degree of bladder distention. Other: None. IMPRESSION: 1. No obstructive  uropathy. 2. Diffusely increased renal echogenicity consistent with chronic medical renal disease. Prominent sized kidneys, with left larger than right, also seen on prior exam. 3. Suspect an area of cortical scarring in the mid lower right kidney. Electronically Signed   By: Keith Rake M.D.   On: 02/26/2020 19:59   DG Chest Portable 1 View  Result Date: 02/25/2020 CLINICAL DATA:  Dyspnea, vomiting EXAM: PORTABLE CHEST 1 VIEW COMPARISON:  12/02/2019 FINDINGS: The lungs are well expanded and are symmetric. Trace interstitial pulmonary edema within the lung bases persists, similar to that noted on prior examination. Mild cardiomegaly, however, has slightly progressed since prior exam. No pneumothorax or pleural effusion. No acute bone abnormality. IMPRESSION: Mild progressive cardiomegaly. Stable trace interstitial pulmonary edema, possibly cardiogenic in nature. Electronically Signed  By: Fidela Salisbury MD   On: 02/25/2020 13:09    Labs:  CBC: Recent Labs    12/04/19 0428 02/25/20 1127 02/26/20 0524 02/27/20 0725  WBC 11.0* 7.7 7.6 8.0  HGB 9.0* 7.8* 7.0* 7.6*  HCT 29.4* 24.7* 22.3* 23.9*  PLT 359 301 263 267    COAGS: No results for input(s): INR, APTT in the last 8760 hours.  BMP: Recent Labs    12/05/19 0352 02/25/20 1127 02/26/20 0524 02/27/20 0725  NA 137 140 139 139  K 3.7 4.2 3.9 3.8  CL 105 105 109 109  CO2 26 25 23 22   GLUCOSE 92 119* 105* 81  BUN 32* 55* 47* 47*  CALCIUM 7.9* 8.1* 7.5* 7.7*  CREATININE 2.73* 4.26* 4.28* 4.42*  GFRNONAA 29* 17* 17* 16*  GFRAA 34* 20* 19* 19*    LIVER FUNCTION TESTS: Recent Labs    02/25/20 1127 02/27/20 0725  BILITOT 0.8  --   AST 17  --   ALT 18  --   ALKPHOS 70  --   PROT 6.5  --   ALBUMIN 2.9* 2.6*    Assessment and Plan:  34 y.o, male inpatient. History of HTN, DM. Discharged from the hospital on 5.26.21. for hypertensive emergency and AKI. Presented to the ED with vomiting X 1 week. Found to be in  hypertensive urgency with AKI with proteinuria and hematuria. US renal from 8.17.21 reads Diffusely increased renal echogenicity consistent with chronic medical renal disease. Prominent sized kidneys, with left larger than right, also seen on prior exam Team is requesting native kidney biopsy for further evaluation of the patient's AKI.Marland Kitchen  BUN 47, Cr 4.42, Hgb 7.6, Phosphorus 4.7. All other labs and medications are within acceptable parameters. Patient has no known drug allergies.  IR consulted for possible native kidney biopsy. Case has been reviewed and procedure approved by Dr. Laurence Ferrari.  Patient tentatively scheduled for 8.20.21.  Team instructed to: Keep Patient to be NPO after midnight Hold prophylactic anticoagulation 24 hours prior to scheduled procedure if restarted prior to the procedure.  IR will call patient when ready.  Risks and benefits of native kidney biopsy was discussed with the patient and/or patient's family including, but not limited to bleeding, infection, damage to adjacent structures or low yield requiring additional tests.  All of the questions were answered and there is agreement to proceed.  Consent signed and in chart.    Thank you for this interesting consult.  I greatly enjoyed meeting [name] and look forward to participating in their care.  A copy of this report was sent to the requesting provider on this date.  Electronically Signed: Jacqualine Mau, NP 02/27/2020, 6:19 PM   I spent a total of 40 Minutes  in face to face in clinical consultation, greater than 50% of which was counseling/coordinating care for native kidney biopsy

## 2020-02-27 NOTE — Progress Notes (Signed)
[facility] Progress Note    Assessment/ Plan:   AKI, possible underlying CKD3: -CKD can likely be secondary to diabetic nephropathy however I am concerned that his AKI is secondary to hypertensive emergency vs collapsing fsgs from HTN +/- diabetes. May very well be secondary to progression of disease but I would like to prove that especially given his age -upc 2.7g, uacr 2328 -renal ultrasound neg for obstruction, interestingly prominent sized kidneys (left>right) which could be from diabetes along with a suspicious area of cortical scarring in the mid lower right kidney -given hematuria will proceed with ANA and ANCA -will proceed with renal biopsy, hopefully tomorrow with IR. Consult placed. Hold lovenox after midnight. NPO after midnight. Recommend still giving him his anti-htn meds in AM. Patient agreeable to getting a biopsy -Continue to monitor daily Cr, Dose meds for GFR<15 -Monitor Daily I/Os, Daily weight  -Maintain MAP>65 for optimal renal perfusion.  -Agree with holding ACE-I, avoid further nephrotoxins including NSAIDS, Morphine.  Unless absolutely necessary, avoid CT with contrast and/or MRI with gadolinium.     Pulmonary edema -stopping lasix, lungs clear on exam on RA. Give as needed  Proteinuria, macroalbuminuria, workup as above -spep w/ if, FLC pending -hep panel and hiv neg  Hematuria -ana and anca pending  Hypertension, improved (uncontrolled in the setting of noncompliance/nonadherence): -on coreg and bidil -can uptitrate coreg to 12.5mg  bid as the next step if needed  Worsening anemia (normocytic) -Neg hemolytic panel. plts stable -check iron panel -Transfuse for Hgb<7 g/dL  Uncontrolled Diabetes Mellitus -mgmt per primary service  Recommendations conveyed to primary service.   Gean Quint, MD [facility] 02/27/2020, 1:31 PM   Subjective:   No complaints, upset with liquid diet. Tearful today when discussing  biopsy. Denies sob, chest pain, dizziness   Objective:   BP (!) 147/85 (BP Location: Left Arm)   Pulse 84   Temp 98.3 F (36.8 C)   Resp 16   Ht 6' (1.829 m)   Wt 106.6 kg   SpO2 99%   BMI 31.87 kg/m   Intake/Output Summary (Last 24 hours) at 02/27/2020 1331 Last data filed at 02/27/2020 0600 Gross per 24 hour  Intake 1081.58 ml  Output 200 ml  Net 881.58 ml   Weight change:   Physical Exam: Gen:nad, well appearing CVS:s1s2, rrr Resp:cta bl, no w/r/r/c, unlabored, bl chest expansion SVX:BLTJ, nt/nd, bs+ QZE:SPQZR pedal edema bl le's Neuro: speech clear and coherent, aaox3, no focal deficits  Imaging: DG Abd 1 View  Result Date: 02/26/2020 CLINICAL DATA:  33 year old male with abdominal distension, nausea vomiting. EXAM: ABDOMEN - 1 VIEW COMPARISON:  Portable chest radiograph yesterday. FINDINGS: Portable AP supine view at 1638 hours. Non obstructed bowel gas pattern. External artifact projects over the pelvis on the initial view, not present on image #2. Incidental pelvic phleboliths. Normal abdominal and pelvic visceral contours. No osseous abnormality identified. IMPRESSION: Normal bowel gas pattern. Electronically Signed   By: Genevie Ann M.D.   On: 02/26/2020 16:59   US RENAL  Result Date: 02/26/2020 CLINICAL DATA:  Acute kidney injury. EXAM: RENAL / URINARY TRACT ULTRASOUND COMPLETE COMPARISON:  Renal ultrasound 12/02/2019 FINDINGS: Right Kidney: Renal measurements: 13.9 x 5.0 x 7.2 cm = volume: 260 mL. Diffusely increased renal echogenicity. No mass or hydronephrosis visualized. Suspect an area of cortical scarring in the mid lower kidney. Left Kidney: Renal measurements: 17.6 x 7.0 x 7.3 cm = volume: 474 mL. Diffusely increased renal echogenicity. No mass or hydronephrosis visualized.  Bladder: Appears normal for degree of bladder distention. Other: None. IMPRESSION: 1. No obstructive uropathy. 2. Diffusely increased renal echogenicity consistent with chronic medical renal  disease. Prominent sized kidneys, with left larger than right, also seen on prior exam. 3. Suspect an area of cortical scarring in the mid lower right kidney. Electronically Signed   By: Keith Rake M.D.   On: 02/26/2020 19:59    Labs: BMET Recent Labs  Lab 02/25/20 1127 02/26/20 0524 02/27/20 0725  NA 140 139 139  K 4.2 3.9 3.8  CL 105 109 109  CO2 25 23 22   GLUCOSE 119* 105* 81  BUN 55* 47* 47*  CREATININE 4.26* 4.28* 4.42*  CALCIUM 8.1* 7.5* 7.7*  PHOS  --   --  4.7*   CBC Recent Labs  Lab 02/25/20 1127 02/26/20 0524 02/27/20 0725  WBC 7.7 7.6 8.0  HGB 7.8* 7.0* 7.6*  HCT 24.7* 22.3* 23.9*  MCV 80.5 81.7 81.8  PLT 301 263 267    Medications:    . sodium chloride   Intravenous Once  . carvedilol  6.25 mg Oral BID WC  . enoxaparin (LOVENOX) injection  30 mg Subcutaneous Q24H  . furosemide  40 mg Intravenous BID  .  HYDROmorphone (DILAUDID) injection  0.5 mg Intravenous Once  . insulin aspart  0-9 Units Subcutaneous TID WC  . insulin aspart  2 Units Subcutaneous TID WC  . insulin detemir  5 Units Subcutaneous BID  . isosorbide-hydrALAZINE  1 tablet Oral TID

## 2020-02-27 NOTE — Plan of Care (Signed)

## 2020-02-27 NOTE — Progress Notes (Signed)
PROGRESS NOTE    [name]  KZS:[id number] DOB: [birth year] DOA: 02/25/2020 PCP: Patient, No Pcp Per    Brief Narrative: [name] is a 34 y.o. male past medical history of hypertension and diabetes, was recently discharged from the hospital on 12/05/2019 for acute kidney injury and hypertensive emergency due to noncompliance who comes in to the hospital because he has been vomiting over the past week at least five or six times he denies any diarrhea abdominal pain or chest pain. Patient has not seen a primary care doctor since discharge he relates he quit taking his medication as it was making him feel bad. In the ED, was found to have a blood pressure of 190/129, creatinine 4.26, UA negative chest x-ray showed a medically stable interstitial pulmonary edema.  Patient admitted for further management.   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 Hepatitis panel and HIV negative Renal ultrasound negative for obstruction Urine noted with hematuria UDS unremarkable Nephrology on board, further work-up pending, ANA/ANCA, plan for renal biopsy with IR on 02/28/2020 Daily renal function panel  Hypertensive crisis/pulmonary edema BP improved, no longer requiring O2 Continue Coreg, bidil Lasix as needed  Diabetes mellitus type 2 A1c 6 Continue SSI, Levemir, NovoLog 3 times daily, Accu-Cheks, hypoglycemic protocol  Normocytic anemia of likely chronic kidney disease  Hemoglobin on a downward trend Anemia panel showed iron 40, sats 17 S/p 1 unit of PRBC on 02/26/2020          Estimated body mass index is 31.87 kg/m as calculated from the following:   Height as of this encounter: 6' (1.829 m).   Weight as  of this encounter: 106.6 kg.  DVT prophylaxis: Lovenox  Code Status: Full code Family Communication: None at bedside Disposition Plan:  Status is: Inpatient  Dispo: The patient is from: Home              Anticipated d/c is to: Home              Anticipated d/c date is: 2 days              Patient currently is not medically stable to d/c.        Consultants: None Procedures: None Antimicrobials: None    Subjective: Patient seen and examined at bedside.  Denies any new complaints.  Requesting to eat more solid food.  Educated patient extensively on the need for medication and appointment compliance.   Objective: Vitals:   02/26/20 2110 02/26/20 2155 02/27/20 0530 02/27/20 1324  BP: (!) 141/90 138/76 (!) 142/92 (!) 147/85  Pulse: 79 76 85 84  Resp: 16 16 17 16   Temp: 98.9 F (37.2 C) 97.7 F (36.5 C) 98.5 F (36.9 C) 98.3 F (36.8 C)  TempSrc: Oral Axillary Oral   SpO2: 100% 100% 95% 99%  Weight:      Height:        Intake/Output Summary (Last 24 hours) at 02/27/2020 1808 Last data filed at 02/27/2020 1300 Gross per 24 hour  Intake 1303.75 ml  Output --  Net 1303.75 ml   Filed Weights   02/25/20 1227  Weight: 106.6 kg    Examination:  General: NAD   Cardiovascular: S1, S2 present  Respiratory: CTAB  Abdomen: Soft, nontender, nondistended, bowel sounds present  Musculoskeletal: No bilateral pedal edema noted  Skin: Normal  Psychiatry: Normal mood    Data Reviewed: I have personally reviewed following labs and imaging studies  CBC: Recent Labs  Lab 02/25/20 1127 02/26/20 0524 02/27/20 0725  WBC 7.7 7.6 8.0  HGB 7.8* 7.0* 7.6*  HCT 24.7* 22.3* 23.9*  MCV 80.5 81.7 81.8  PLT 301 263 025   Basic Metabolic Panel: Recent Labs  Lab 02/25/20 1127 02/26/20 0524 02/27/20 0725  NA 140 139 139  K 4.2 3.9 3.8  CL 105 109 109  CO2 25 23 22   GLUCOSE 119* 105* 81  BUN 55* 47* 47*  CREATININE 4.26* 4.28* 4.42*  CALCIUM 8.1* 7.5* 7.7*  PHOS   --   --  4.7*   GFR: Estimated Creatinine Clearance: 29.4 mL/min (A) (by C-G formula based on SCr of 4.42 mg/dL (H)). Liver Function Tests: Recent Labs  Lab 02/25/20 1127 02/27/20 0725  AST 17  --   ALT 18  --   ALKPHOS 70  --   BILITOT 0.8  --   PROT 6.5  --   ALBUMIN 2.9* 2.6*   Recent Labs  Lab 02/25/20 1127  LIPASE 21   No results for input(s): AMMONIA in the last 168 hours. Coagulation Profile: No results for input(s): INR, PROTIME in the last 168 hours. Cardiac Enzymes: No results for input(s): CKTOTAL, CKMB, CKMBINDEX, TROPONINI in the last 168 hours. BNP (last 3 results) No results for input(s): PROBNP in the last 8760 hours. HbA1C: Recent Labs    02/25/20 1730 02/26/20 1504  HGBA1C 6.5* 6.0*   CBG: Recent Labs  Lab 02/26/20 1632 02/26/20 2107 02/27/20 0720 02/27/20 1128 02/27/20 1706  GLUCAP 116* 111* 76 122* 81   Lipid Profile: No results for input(s): CHOL, HDL, LDLCALC, TRIG, CHOLHDL, LDLDIRECT in the last 72 hours. Thyroid Function Tests: No results for input(s): TSH, T4TOTAL, FREET4, T3FREE, THYROIDAB in the last 72 hours. Anemia Panel: Recent Labs    02/26/20 1504  VITAMINB12 506  FOLATE 7.3  FERRITIN 262  TIBC 240*  IRON 40*  RETICCTPCT 1.1   Sepsis Labs: No results for input(s): PROCALCITON, LATICACIDVEN in the last 168 hours.  Recent Results (from the past 240 hour(s))  SARS Coronavirus 2 by RT PCR (hospital order, performed in [facility] hospital lab) Nasopharyngeal Nasopharyngeal Swab     Status: None   Collection Time: 02/25/20 11:49 AM   Specimen: Nasopharyngeal Swab  Result Value Ref Range Status   SARS Coronavirus 2 NEGATIVE NEGATIVE Final    Comment: (NOTE) SARS-CoV-2 target nucleic acids are NOT DETECTED.  The SARS-CoV-2 RNA is generally detectable in upper and lower respiratory specimens during the acute phase of infection. The lowest concentration of SARS-CoV-2 viral copies this assay can detect is 250 copies /  mL. A negative result does not preclude SARS-CoV-2 infection and should not be used as the sole basis for treatment or other patient management decisions.  A negative result may occur with improper specimen collection / handling, submission of specimen other than nasopharyngeal swab, presence of viral mutation(s) within the areas targeted by this assay, and inadequate number of viral copies (<250 copies / mL). A negative result must be combined with clinical observations, patient history, and epidemiological information.  Fact Sheet for Patients:   StrictlyIdeas.no  Fact Sheet for Healthcare Providers: BankingDealers.co.za  This test is not yet approved or  cleared by the [place] FDA and has been authorized  for detection and/or diagnosis of SARS-CoV-2 by FDA under an Emergency Use Authorization (EUA).  This EUA will remain in effect (meaning this test can be used) for the duration of the COVID-19 declaration under Section 564(b)(1) of the Act, 21 U.S.C. section 360bbb-3(b)(1), unless the authorization is terminated or revoked sooner.  Performed at [facility], [place] [address], [place], [place] [address]          Radiology Studies: DG Abd 1 View  Result Date: 02/26/2020 CLINICAL DATA:  34 year old male with abdominal distension, nausea vomiting. EXAM: ABDOMEN - 1 VIEW COMPARISON:  Portable chest radiograph yesterday. FINDINGS: Portable AP supine view at 1638 hours. Non obstructed bowel gas pattern. External artifact projects over the pelvis on the initial view, not present on image #2. Incidental pelvic phleboliths. Normal abdominal and pelvic visceral contours. No osseous abnormality identified. IMPRESSION: Normal bowel gas pattern. Electronically Signed   By: Genevie Ann M.D.   On: 02/26/2020 16:59   US RENAL  Result Date: 02/26/2020 CLINICAL DATA:  Acute kidney injury. EXAM: RENAL / URINARY TRACT ULTRASOUND  COMPLETE COMPARISON:  Renal ultrasound 12/02/2019 FINDINGS: Right Kidney: Renal measurements: 13.9 x 5.0 x 7.2 cm = volume: 260 mL. Diffusely increased renal echogenicity. No mass or hydronephrosis visualized. Suspect an area of cortical scarring in the mid lower kidney. Left Kidney: Renal measurements: 17.6 x 7.0 x 7.3 cm = volume: 474 mL. Diffusely increased renal echogenicity. No mass or hydronephrosis visualized. Bladder: Appears normal for degree of bladder distention. Other: None. IMPRESSION: 1. No obstructive uropathy. 2. Diffusely increased renal echogenicity consistent with chronic medical renal disease. Prominent sized kidneys, with left larger than right, also seen on prior exam. 3. Suspect an area of cortical scarring in the mid lower right kidney. Electronically Signed   By: Keith Rake M.D.   On: 02/26/2020 19:59        Scheduled Meds: . sodium chloride   Intravenous Once  . carvedilol  6.25 mg Oral BID WC  . enoxaparin (LOVENOX) injection  30 mg Subcutaneous Q24H  .  HYDROmorphone (DILAUDID) injection  0.5 mg Intravenous Once  . insulin aspart  0-9 Units Subcutaneous TID WC  . insulin aspart  2 Units Subcutaneous TID WC  . insulin detemir  5 Units Subcutaneous BID  . isosorbide-hydrALAZINE  1 tablet Oral TID   Continuous Infusions:    LOS: 2 days     Alma Friendly, MD 02/27/2020, 6:08 PM

## 2020-02-28 LAB — CBC WITH DIFFERENTIAL/PLATELET
Abs Immature Granulocytes: 0.03 10*3/uL (ref 0.00–0.07)
Basophils Absolute: 0 10*3/uL (ref 0.0–0.1)
Basophils Relative: 0 %
Eosinophils Absolute: 0.2 10*3/uL (ref 0.0–0.5)
Eosinophils Relative: 2 %
HCT: 24.8 % — ABNORMAL LOW (ref 39.0–52.0)
Hemoglobin: 7.9 g/dL — ABNORMAL LOW (ref 13.0–17.0)
Immature Granulocytes: 0 %
Lymphocytes Relative: 19 %
Lymphs Abs: 1.6 10*3/uL (ref 0.7–4.0)
MCH: 26.2 pg (ref 26.0–34.0)
MCHC: 31.9 g/dL (ref 30.0–36.0)
MCV: 82.4 fL (ref 80.0–100.0)
Monocytes Absolute: 0.6 10*3/uL (ref 0.1–1.0)
Monocytes Relative: 8 %
Neutro Abs: 5.9 10*3/uL (ref 1.7–7.7)
Neutrophils Relative %: 71 %
Platelets: 268 10*3/uL (ref 150–400)
RBC: 3.01 MIL/uL — ABNORMAL LOW (ref 4.22–5.81)
RDW: 15.5 % (ref 11.5–15.5)
WBC: 8.3 10*3/uL (ref 4.0–10.5)
nRBC: 0 % (ref 0.0–0.2)

## 2020-02-28 LAB — RENAL FUNCTION PANEL
Albumin: 2.7 g/dL — ABNORMAL LOW (ref 3.5–5.0)
Anion gap: 5 (ref 5–15)
BUN: 43 mg/dL — ABNORMAL HIGH (ref 6–20)
CO2: 24 mmol/L (ref 22–32)
Calcium: 7.9 mg/dL — ABNORMAL LOW (ref 8.9–10.3)
Chloride: 110 mmol/L (ref 98–111)
Creatinine, Ser: 4.57 mg/dL — ABNORMAL HIGH (ref 0.61–1.24)
GFR calc Af Amer: 18 mL/min — ABNORMAL LOW (ref >60)
GFR calc non Af Amer: 15 mL/min — ABNORMAL LOW (ref >60)
Glucose, Bld: 99 mg/dL (ref 70–99)
Phosphorus: 4.8 mg/dL — ABNORMAL HIGH (ref 2.5–4.6)
Potassium: 3.9 mmol/L (ref 3.5–5.1)
Sodium: 139 mmol/L (ref 135–145)

## 2020-02-28 LAB — GLUCOSE, CAPILLARY
Glucose-Capillary: 92 mg/dL (ref 70–99)
Glucose-Capillary: 87 mg/dL (ref 70–99)
Glucose-Capillary: 96 mg/dL (ref 70–99)
Glucose-Capillary: 93 mg/dL (ref 70–99)

## 2020-02-28 MED ORDER — ALUM & MAG HYDROXIDE-SIMETH 200-200-20 MG/5ML PO SUSP
15.0000 mL | Freq: Four times a day (QID) | ORAL | Status: DC | PRN
  Administered 2020-02-28: 15 mL via ORAL
  Filled 2020-02-28: qty 30

## 2020-02-28 MED ORDER — SODIUM CHLORIDE 0.9 % IV SOLN
125.0000 mg | Freq: Once | INTRAVENOUS | Status: AC
  Administered 2020-02-28: 18:00:00 125 mg via INTRAVENOUS
  Filled 2020-02-28: qty 10

## 2020-02-28 NOTE — Progress Notes (Signed)
PROGRESS NOTE    [name]  POE:[id number] DOB: 13-Sep-1985 DOA: 02/25/2020 PCP: Patient, No Pcp Per    Brief Narrative: [name] is a 34 y.o. male past medical history of hypertension and diabetes, was recently discharged from the hospital on 12/05/2019 for acute kidney injury and hypertensive emergency due to noncompliance who comes in to the hospital because he has been vomiting over the past week at least five or six times he denies any diarrhea abdominal pain or chest pain. Patient has not seen a primary care doctor since discharge he relates he quit taking his medication as it was making him feel bad. In the ED, was found to have a blood pressure of 190/129, creatinine 4.26, UA negative chest x-ray showed a medically stable interstitial pulmonary edema.  Patient admitted for further management.   Assessment & Plan:   Active Problems:   AKI (acute kidney injury) ([place])   Essential hypertension   Type 2 diabetes mellitus without complication ([place])   Hypertensive urgency   Dehydration   AKI on CKD stage IIIb Proteinuria, macroalbuminuria Unknown etiology, likely 2/2 diabetes/hypertensive nephropathy Hepatitis panel and HIV negative Renal ultrasound negative for obstruction Urine noted with hematuria UDS unremarkable Nephrology on board, further work-up pending, ANA/ANCA, plan for renal biopsy with IR on 02/29/2020 Daily renal function panel  Hypertensive crisis/pulmonary edema BP improved, no longer requiring O2 Continue Coreg, bidil Lasix as needed  Diabetes mellitus type 2 A1c 6 Continue SSI, Levemir, NovoLog 3 times daily, Accu-Cheks, hypoglycemic protocol  Normocytic anemia of likely chronic kidney disease  Hemoglobin on a downward trend Anemia panel showed iron 40, sats 17 S/p 1 unit of PRBC on 02/26/2020          Estimated body mass index is 31.87 kg/m as calculated from the following:   Height as of this encounter: 6' (1.829 m).   Weight as  of this encounter: 106.6 kg.  DVT prophylaxis: Lovenox  Code Status: Full code Family Communication: None at bedside Disposition Plan:  Status is: Inpatient  Dispo: The patient is from: Home              Anticipated d/c is to: Home              Anticipated d/c date is: 2 days              Patient currently is not medically stable to d/c.        Consultants: None Procedures: None Antimicrobials: None    Subjective: Denies any new complaints   Objective: Vitals:   02/27/20 1324 02/27/20 2121 02/28/20 0519 02/28/20 1247  BP: (!) 147/85 (!) 144/75 (!) 149/80 (!) 142/79  Pulse: 84 78 87 85  Resp: 16 17 17 18   Temp: 98.3 F (36.8 C) 98.3 F (36.8 C) 98.4 F (36.9 C) 98.2 F (36.8 C)  TempSrc:      SpO2: 99% 99% 97% 99%  Weight:      Height:        Intake/Output Summary (Last 24 hours) at 02/28/2020 1615 Last data filed at 02/28/2020 3154 Gross per 24 hour  Intake 510 ml  Output --  Net 510 ml   Filed Weights   02/25/20 1227  Weight: 106.6 kg    Examination:  General: NAD   Cardiovascular: S1, S2 present  Respiratory: CTAB  Abdomen: Soft, nontender, nondistended, bowel sounds present  Musculoskeletal: No bilateral pedal edema noted  Skin: Normal  Psychiatry: Normal mood    Data  Reviewed: I have personally reviewed following labs and imaging studies  CBC: Recent Labs  Lab 02/25/20 1127 02/26/20 0524 02/27/20 0725 02/28/20 0517  WBC 7.7 7.6 8.0 8.3  NEUTROABS  --   --   --  5.9  HGB 7.8* 7.0* 7.6* 7.9*  HCT 24.7* 22.3* 23.9* 24.8*  MCV 80.5 81.7 81.8 82.4  PLT 301 263 267 034   Basic Metabolic Panel: Recent Labs  Lab 02/25/20 1127 02/26/20 0524 02/27/20 0725 02/28/20 0517  NA 140 139 139 139  K 4.2 3.9 3.8 3.9  CL 105 109 109 110  CO2 25 23 22 24   GLUCOSE 119* 105* 81 99  BUN 55* 47* 47* 43*  CREATININE 4.26* 4.28* 4.42* 4.57*  CALCIUM 8.1* 7.5* 7.7* 7.9*  PHOS  --   --  4.7* 4.8*   GFR: Estimated Creatinine Clearance:  28.5 mL/min (A) (by C-G formula based on SCr of 4.57 mg/dL (H)). Liver Function Tests: Recent Labs  Lab 02/25/20 1127 02/27/20 0725 02/28/20 0517  AST 17  --   --   ALT 18  --   --   ALKPHOS 70  --   --   BILITOT 0.8  --   --   PROT 6.5  --   --   ALBUMIN 2.9* 2.6* 2.7*   Recent Labs  Lab 02/25/20 1127  LIPASE 21   No results for input(s): AMMONIA in the last 168 hours. Coagulation Profile: No results for input(s): INR, PROTIME in the last 168 hours. Cardiac Enzymes: No results for input(s): CKTOTAL, CKMB, CKMBINDEX, TROPONINI in the last 168 hours. BNP (last 3 results) No results for input(s): PROBNP in the last 8760 hours. HbA1C: Recent Labs    02/25/20 1730 02/26/20 1504  HGBA1C 6.5* 6.0*   CBG: Recent Labs  Lab 02/27/20 1128 02/27/20 1706 02/27/20 2122 02/28/20 0717 02/28/20 1150  GLUCAP 122* 81 141* 92 87   Lipid Profile: No results for input(s): CHOL, HDL, LDLCALC, TRIG, CHOLHDL, LDLDIRECT in the last 72 hours. Thyroid Function Tests: No results for input(s): TSH, T4TOTAL, FREET4, T3FREE, THYROIDAB in the last 72 hours. Anemia Panel: Recent Labs    02/26/20 1504  VITAMINB12 506  FOLATE 7.3  FERRITIN 262  TIBC 240*  IRON 40*  RETICCTPCT 1.1   Sepsis Labs: No results for input(s): PROCALCITON, LATICACIDVEN in the last 168 hours.  Recent Results (from the past 240 hour(s))  SARS Coronavirus 2 by RT PCR (hospital order, performed in [facility] hospital lab) Nasopharyngeal Nasopharyngeal Swab     Status: None   Collection Time: 02/25/20 11:49 AM   Specimen: Nasopharyngeal Swab  Result Value Ref Range Status   SARS Coronavirus 2 NEGATIVE NEGATIVE Final    Comment: (NOTE) SARS-CoV-2 target nucleic acids are NOT DETECTED.  The SARS-CoV-2 RNA is generally detectable in upper and lower respiratory specimens during the acute phase of infection. The lowest concentration of SARS-CoV-2 viral copies this assay can detect is 250 copies / mL. A negative  result does not preclude SARS-CoV-2 infection and should not be used as the sole basis for treatment or other patient management decisions.  A negative result may occur with improper specimen collection / handling, submission of specimen other than nasopharyngeal swab, presence of viral mutation(s) within the areas targeted by this assay, and inadequate number of viral copies (<250 copies / mL). A negative result must be combined with clinical observations, patient history, and epidemiological information.  Fact Sheet for Patients:   StrictlyIdeas.no  Fact Sheet  for Healthcare Providers: BankingDealers.co.za  This test is not yet approved or  cleared by the [place] and has been authorized for detection and/or diagnosis of SARS-CoV-2 by FDA under an Emergency Use Authorization (EUA).  This EUA will remain in effect (meaning this test can be used) for the duration of the COVID-19 declaration under Section 564(b)(1) of the Act, 21 U.S.C. section 360bbb-3(b)(1), unless the authorization is terminated or revoked sooner.  Performed at [facility], [place] [address] [place], [place] [address]          Radiology Studies: DG Abd 1 View  Result Date: 02/26/2020 CLINICAL DATA:  34 year old male with abdominal distension, nausea vomiting. EXAM: ABDOMEN - 1 VIEW COMPARISON:  Portable chest radiograph yesterday. FINDINGS: Portable AP supine view at 1638 hours. Non obstructed bowel gas pattern. External artifact projects over the pelvis on the initial view, not present on image #2. Incidental pelvic phleboliths. Normal abdominal and pelvic visceral contours. No osseous abnormality identified. IMPRESSION: Normal bowel gas pattern. Electronically Signed   By: Genevie Ann M.D.   On: 02/26/2020 16:59   US RENAL  Result Date: 02/26/2020 CLINICAL DATA:  Acute kidney injury. EXAM: RENAL / URINARY TRACT ULTRASOUND COMPLETE  COMPARISON:  Renal ultrasound 12/02/2019 FINDINGS: Right Kidney: Renal measurements: 13.9 x 5.0 x 7.2 cm = volume: 260 mL. Diffusely increased renal echogenicity. No mass or hydronephrosis visualized. Suspect an area of cortical scarring in the mid lower kidney. Left Kidney: Renal measurements: 17.6 x 7.0 x 7.3 cm = volume: 474 mL. Diffusely increased renal echogenicity. No mass or hydronephrosis visualized. Bladder: Appears normal for degree of bladder distention. Other: None. IMPRESSION: 1. No obstructive uropathy. 2. Diffusely increased renal echogenicity consistent with chronic medical renal disease. Prominent sized kidneys, with left larger than right, also seen on prior exam. 3. Suspect an area of cortical scarring in the mid lower right kidney. Electronically Signed   By: Keith Rake M.D.   On: 02/26/2020 19:59        Scheduled Meds: . sodium chloride   Intravenous Once  . carvedilol  6.25 mg Oral BID WC  . insulin aspart  0-9 Units Subcutaneous TID WC  . insulin aspart  2 Units Subcutaneous TID WC  . insulin detemir  5 Units Subcutaneous BID  . isosorbide-hydrALAZINE  1 tablet Oral TID   Continuous Infusions: . ferric gluconate (FERRLECIT/NULECIT) IV       LOS: 3 days     Alma Friendly, MD 02/28/2020, 4:15 PM

## 2020-02-28 NOTE — Progress Notes (Signed)
[facility] Progress Note    Assessment/ Plan:   AKI, possible underlying CKD3: -CKD can likely be secondary to diabetic nephropathy however I am concerned that his AKI is secondary to hypertensive emergency vs collapsing fsgs from HTN +/- diabetes. May very well be secondary to progression of disease but I would like to prove that especially given his age -upc 2.7g, uacr 2328 -renal ultrasound neg for obstruction, interestingly prominent sized kidneys (left>right) which could be from diabetes along with a suspicious area of cortical scarring in the mid lower right kidney -given hematuria will proceed with ANA and ANCA--> negative -will proceed with renal biopsy, hopefully tomorrow with IR. Consult placed. Hold lovenox after midnight. NPO after midnight. Recommend still giving him his anti-htn meds in AM. Patient agreeable to getting a biopsy -Continue to monitor daily Cr, Dose meds for GFR<15 -Monitor Daily I/Os, Daily weight  -Maintain MAP>65 for optimal renal perfusion.  -Agree with holding ACE-I, avoid further nephrotoxins including NSAIDS, Morphine.  Unless absolutely necessary, avoid CT with contrast and/or MRI with gadolinium.     Pulmonary edema -stopping lasix, lungs clear on exam on RA. Give as needed  Proteinuria, macroalbuminuria, workup as above -spep w/ if, FLC pending -hep panel and hiv neg  Hematuria -ana and anca negative  Hypertension, improved (uncontrolled in the setting of noncompliance/nonadherence): -on coreg and bidil -can uptitrate coreg to 12.5mg  bid as the next step if needed  Worsening anemia (normocytic) -Neg hemolytic panel. plts stable -check iron panel--> will give venofer -Transfuse for Hgb<7 g/dL  Uncontrolled Diabetes Mellitus -mgmt per primary service  Madelon Lips MD [facility]  Subjective:    For bx tomorrow.  Cr 4.57.  No uremic symptoms.    Objective:   BP (!) 142/79 (BP Location: Left  Arm)   Pulse 85   Temp 98.2 F (36.8 C)   Resp 18   Ht 6' (1.829 m)   Wt 106.6 kg   SpO2 99%   BMI 31.87 kg/m   Intake/Output Summary (Last 24 hours) at 02/28/2020 1603 Last data filed at 02/28/2020 0519 Gross per 24 hour  Intake 510 ml  Output --  Net 510 ml   Weight change:   Physical Exam: Gen:nad, well appearing CVS:s1s2, rrr Resp:clear YYT:KPTW, nt/nd, bs+ Ext: no LE edema Neuro: speech clear and coherent, aaox3, no focal deficits  Imaging: DG Abd 1 View  Result Date: 02/26/2020 CLINICAL DATA:  34 year old male with abdominal distension, nausea vomiting. EXAM: ABDOMEN - 1 VIEW COMPARISON:  Portable chest radiograph yesterday. FINDINGS: Portable AP supine view at 1638 hours. Non obstructed bowel gas pattern. External artifact projects over the pelvis on the initial view, not present on image #2. Incidental pelvic phleboliths. Normal abdominal and pelvic visceral contours. No osseous abnormality identified. IMPRESSION: Normal bowel gas pattern. Electronically Signed   By: Genevie Ann M.D.   On: 02/26/2020 16:59   US RENAL  Result Date: 02/26/2020 CLINICAL DATA:  Acute kidney injury. EXAM: RENAL / URINARY TRACT ULTRASOUND COMPLETE COMPARISON:  Renal ultrasound 12/02/2019 FINDINGS: Right Kidney: Renal measurements: 13.9 x 5.0 x 7.2 cm = volume: 260 mL. Diffusely increased renal echogenicity. No mass or hydronephrosis visualized. Suspect an area of cortical scarring in the mid lower kidney. Left Kidney: Renal measurements: 17.6 x 7.0 x 7.3 cm = volume: 474 mL. Diffusely increased renal echogenicity. No mass or hydronephrosis visualized. Bladder: Appears normal for degree of bladder distention. Other: None. IMPRESSION: 1. No obstructive uropathy. 2. Diffusely increased renal echogenicity  consistent with chronic medical renal disease. Prominent sized kidneys, with left larger than right, also seen on prior exam. 3. Suspect an area of cortical scarring in the mid lower right kidney.  Electronically Signed   By: Keith Rake M.D.   On: 02/26/2020 19:59    Labs: BMET Recent Labs  Lab 02/25/20 1127 02/26/20 0524 02/27/20 0725 02/28/20 0517  NA 140 139 139 139  K 4.2 3.9 3.8 3.9  CL 105 109 109 110  CO2 25 23 22 24   GLUCOSE 119* 105* 81 99  BUN 55* 47* 47* 43*  CREATININE 4.26* 4.28* 4.42* 4.57*  CALCIUM 8.1* 7.5* 7.7* 7.9*  PHOS  --   --  4.7* 4.8*   CBC Recent Labs  Lab 02/25/20 1127 02/26/20 0524 02/27/20 0725 02/28/20 0517  WBC 7.7 7.6 8.0 8.3  NEUTROABS  --   --   --  5.9  HGB 7.8* 7.0* 7.6* 7.9*  HCT 24.7* 22.3* 23.9* 24.8*  MCV 80.5 81.7 81.8 82.4  PLT 301 263 267 268    Medications:    . sodium chloride   Intravenous Once  . carvedilol  6.25 mg Oral BID WC  . insulin aspart  0-9 Units Subcutaneous TID WC  . insulin aspart  2 Units Subcutaneous TID WC  . insulin detemir  5 Units Subcutaneous BID  . isosorbide-hydrALAZINE  1 tablet Oral TID

## 2020-02-29 ENCOUNTER — Inpatient Hospital Stay (HOSPITAL_COMMUNITY): Payer: BC Managed Care – PPO

## 2020-02-29 ENCOUNTER — Other Ambulatory Visit (HOSPITAL_COMMUNITY): Payer: BC Managed Care – PPO

## 2020-02-29 LAB — CBC WITH DIFFERENTIAL/PLATELET
Abs Immature Granulocytes: 0.03 10*3/uL (ref 0.00–0.07)
Basophils Absolute: 0 10*3/uL (ref 0.0–0.1)
Basophils Relative: 0 %
Eosinophils Absolute: 0.1 10*3/uL (ref 0.0–0.5)
Eosinophils Relative: 2 %
HCT: 22.8 % — ABNORMAL LOW (ref 39.0–52.0)
Hemoglobin: 7.1 g/dL — ABNORMAL LOW (ref 13.0–17.0)
Immature Granulocytes: 0 %
Lymphocytes Relative: 20 %
Lymphs Abs: 1.5 10*3/uL (ref 0.7–4.0)
MCH: 26 pg (ref 26.0–34.0)
MCHC: 31.1 g/dL (ref 30.0–36.0)
MCV: 83.5 fL (ref 80.0–100.0)
Monocytes Absolute: 0.8 10*3/uL (ref 0.1–1.0)
Monocytes Relative: 11 %
Neutro Abs: 5.2 10*3/uL (ref 1.7–7.7)
Neutrophils Relative %: 67 %
Platelets: 276 10*3/uL (ref 150–400)
RBC: 2.73 MIL/uL — ABNORMAL LOW (ref 4.22–5.81)
RDW: 15.4 % (ref 11.5–15.5)
WBC: 7.7 10*3/uL (ref 4.0–10.5)
nRBC: 0 % (ref 0.0–0.2)

## 2020-02-29 LAB — PROTIME-INR
INR: 1 (ref 0.8–1.2)
Prothrombin Time: 13.1 seconds (ref 11.4–15.2)

## 2020-02-29 LAB — RENAL FUNCTION PANEL
Albumin: 2.6 g/dL — ABNORMAL LOW (ref 3.5–5.0)
Anion gap: 8 (ref 5–15)
BUN: 41 mg/dL — ABNORMAL HIGH (ref 6–20)
CO2: 23 mmol/L (ref 22–32)
Calcium: 8.1 mg/dL — ABNORMAL LOW (ref 8.9–10.3)
Chloride: 107 mmol/L (ref 98–111)
Creatinine, Ser: 4.3 mg/dL — ABNORMAL HIGH (ref 0.61–1.24)
GFR calc Af Amer: 19 mL/min — ABNORMAL LOW (ref >60)
GFR calc non Af Amer: 17 mL/min — ABNORMAL LOW (ref >60)
Glucose, Bld: 71 mg/dL (ref 70–99)
Phosphorus: 4.9 mg/dL — ABNORMAL HIGH (ref 2.5–4.6)
Potassium: 3.9 mmol/L (ref 3.5–5.1)
Sodium: 138 mmol/L (ref 135–145)

## 2020-02-29 LAB — GLUCOSE, CAPILLARY
Glucose-Capillary: 77 mg/dL (ref 70–99)
Glucose-Capillary: 83 mg/dL (ref 70–99)
Glucose-Capillary: 61 mg/dL — ABNORMAL LOW (ref 70–99)
Glucose-Capillary: 87 mg/dL (ref 70–99)
Glucose-Capillary: 107 mg/dL — ABNORMAL HIGH (ref 70–99)

## 2020-02-29 LAB — C4 COMPLEMENT: Complement C4, Body Fluid: 32 mg/dL (ref 12–38)

## 2020-02-29 LAB — C3 COMPLEMENT: C3 Complement: 122 mg/dL (ref 82–167)

## 2020-02-29 MED ORDER — FENTANYL CITRATE (PF) 100 MCG/2ML IJ SOLN
INTRAMUSCULAR | Status: AC
  Filled 2020-02-29: qty 2

## 2020-02-29 MED ORDER — GELATIN ABSORBABLE 12-7 MM EX MISC
CUTANEOUS | Status: AC
  Filled 2020-02-29: qty 1

## 2020-02-29 MED ORDER — MIDAZOLAM HCL 2 MG/2ML IJ SOLN
INTRAMUSCULAR | Status: AC
  Filled 2020-02-29: qty 2

## 2020-02-29 MED ORDER — MIDAZOLAM HCL 2 MG/2ML IJ SOLN
INTRAMUSCULAR | Status: AC
  Filled 2020-02-29: qty 4

## 2020-02-29 MED ORDER — FENTANYL CITRATE (PF) 100 MCG/2ML IJ SOLN
INTRAMUSCULAR | Status: AC | PRN
Start: 2020-02-29 — End: 2020-02-29
  Administered 2020-02-29 (×2): 50 ug via INTRAVENOUS

## 2020-02-29 MED ORDER — LIDOCAINE HCL 1 % IJ SOLN
INTRAMUSCULAR | Status: AC
  Filled 2020-02-29: qty 20

## 2020-02-29 MED ORDER — FENTANYL CITRATE (PF) 100 MCG/2ML IJ SOLN
INTRAMUSCULAR | Status: DC
Start: 2020-02-29 — End: 2020-02-29
  Filled 2020-02-29: qty 2

## 2020-02-29 MED ORDER — MIDAZOLAM HCL 2 MG/2ML IJ SOLN
INTRAMUSCULAR | Status: AC | PRN
  Administered 2020-02-29 (×3): 1 mg via INTRAVENOUS

## 2020-02-29 NOTE — TOC Initial Note (Signed)
Transition of Care [facility]) - Initial/Assessment Note    Patient Details  Name: [name] MRN: [id number] Date of Birth: Aug 05, 1985  Transition of [facility]) CM/SW Contact:    Servando Snare, LCSW Phone Number: 02/29/2020, 9:32 AM  Clinical Narrative:    TOC received consult for PCP and assistance with meds. LCSW met bedside with patient. Patient works full time and has Film/video editor. PT stopped meds due to how they were making him feel. Patient stated that things got "hectic" when he was discharged in May and he was delayed in getting a PCP. Patient reports that he has an appointment scheduled for September 2, but ended up in the hospital prior to the appointment. LCSW discussed the importance of keeping the appointment. TOC signing off.                 Expected Discharge Plan: Home/Self Care Barriers to Discharge: Continued Medical Work up   Patient Goals and CMS Choice Patient states their goals for this hospitalization and ongoing recovery are:: Go home and get better   Choice offered to / list presented to : NA  Expected Discharge Plan and Services Expected Discharge Plan: Home/Self Care In-house Referral: NA Discharge Planning Services: NA Post Acute Care Choice: NA                   DME Arranged: N/A DME Agency: NA       HH Arranged: NA          Prior Living Arrangements/Services   Lives with:: Significant Other Patient language and need for interpreter reviewed:: Yes Do you feel safe going back to the place where you live?: Yes      Need for Family Participation in Patient Care: No (Comment) Care giver support system in place?: Yes (comment)   Criminal Activity/Legal Involvement Pertinent to Current Situation/Hospitalization: No - Comment as needed  Activities of Daily Living Home Assistive Devices/Equipment: None ADL Screening (condition at time of admission) Patient's cognitive ability adequate to safely complete daily activities?: Yes Is  the patient deaf or have difficulty hearing?: No Does the patient have difficulty seeing, even when wearing glasses/contacts?: No Does the patient have difficulty concentrating, remembering, or making decisions?: Yes Patient able to express need for assistance with ADLs?: Yes Does the patient have difficulty dressing or bathing?: No Independently performs ADLs?: Yes (appropriate for developmental age) Does the patient have difficulty walking or climbing stairs?: No Weakness of Legs: Both Weakness of Arms/Hands: Both  Permission Sought/Granted   Permission granted to share information with : No              Emotional Assessment Appearance:: Appears stated age Attitude/Demeanor/Rapport: Engaged Affect (typically observed): Pleasant Orientation: : Oriented to Self, Oriented to Place, Oriented to Situation, Oriented to  Time Alcohol / Substance Use: Not Applicable Psych Involvement: No (comment)  Admission diagnosis:  Dehydration [E86.0] Hypertensive urgency [I16.0] AKI (acute kidney injury) ([place]) [N17.9] Patient Active Problem List   Diagnosis Date Noted   Dehydration    Hypertensive urgency 02/25/2020   AKI (acute kidney injury) ([place]) 12/02/2019   Anemia 12/02/2019   Essential hypertension 12/02/2019   SOB (shortness of breath) 12/02/2019   Type 2 diabetes mellitus without complication ([place]) 34/74/2595   PCP:  Patient, No Pcp Per Pharmacy:   [facility] [place], [address] AT [address] [place] [address] Phone: [phone] Fax:  [place], [address] [place] [address] Phone: [phone] Fax: [phone]     Social Determinants of Health (SDOH) Interventions    Readmission Risk Interventions Readmission Risk Prevention Plan 02/29/2020  Transportation Screening Complete  Home Care Screening Complete   Some recent data might be hidden

## 2020-02-29 NOTE — Sedation Documentation (Signed)
TC to [facility] in [place] to request patient be transferred to CT for procedure as per Dr Anselm Pancoast. Approval for transfer obtained from [employer].

## 2020-02-29 NOTE — Progress Notes (Signed)
PROGRESS NOTE    [name]  EPP:[id number] DOB: April 18, 1986 DOA: 02/25/2020 PCP: Patient, No Pcp Per    Brief Narrative: [name] is a 34 y.o. male past medical history of hypertension and diabetes, was recently discharged from the hospital on 12/05/2019 for acute kidney injury and hypertensive emergency due to noncompliance who comes in to the hospital because he has been vomiting over the past week at least five or six times he denies any diarrhea abdominal pain or chest pain. Patient has not seen a primary care doctor since discharge he relates he quit taking his medication as it was making him feel bad. In the ED, was found to have a blood pressure of 190/129, creatinine 4.26, UA negative chest x-ray showed a medically stable interstitial pulmonary edema.  Patient admitted for further management.   Assessment & Plan:   Active Problems:   AKI (acute kidney injury) ([place])   Essential hypertension   Type 2 diabetes mellitus without complication (H[facility])   Hypertensive urgency   Dehydration   AKI on CKD stage IIIb Proteinuria, macroalbuminuria Unknown etiology, likely 2/2 diabetes/hypertensive nephropathy Hepatitis panel and HIV negative Renal ultrasound negative for obstruction Urine noted with hematuria UDS unremarkable Nephrology on board, further work-up pending, ANA/ANCA Renal biopsy with IR on 02/29/2020, was canceled based on increased risk of bleeding due to uncontrolled BP and horseshoes kidney Daily renal function panel  Hypertensive crisis/pulmonary edema BP improved, no longer requiring O2 Continue Coreg, bidil Lasix as needed  Diabetes mellitus type 2 A1c 6 Continue SSI, Levemir, NovoLog 3 times daily, Accu-Cheks, hypoglycemic protocol  Normocytic anemia of likely chronic kidney disease  Hemoglobin on a downward trend Anemia panel showed iron 40, sats 17 S/p 1 unit of PRBC on 02/26/2020          Estimated body mass index is 31.87 kg/m as  calculated from the following:   Height as of this encounter: 6' (1.829 m).   Weight as of this encounter: 106.6 kg.  DVT prophylaxis: Lovenox  Code Status: Full code Family Communication: None at bedside Disposition Plan:  Status is: Inpatient  Dispo: The patient is from: Home              Anticipated d/c is to: Home              Anticipated d/c date is: 2 days              Patient currently is not medically stable to d/c.        Consultants: None Procedures: None Antimicrobials: None    Subjective: Patient denies any new complaints.  Just reports he is hungry.   Objective: Vitals:   02/29/20 1305 02/29/20 1310 02/29/20 1320 02/29/20 1348  BP: (!) 157/95 (!) 158/92 (!) 158/96 129/65  Pulse: 84 85 80 82  Resp: 20 20 14 15   Temp:    97.7 F (36.5 C)  TempSrc:      SpO2: 98% 97% 97% 90%  Weight:      Height:        Intake/Output Summary (Last 24 hours) at 02/29/2020 1613 Last data filed at 02/29/2020 1351 Gross per 24 hour  Intake 242.93 ml  Output 0 ml  Net 242.93 ml   Filed Weights   02/25/20 1227  Weight: 106.6 kg    Examination:  General: NAD   Cardiovascular: S1, S2 present  Respiratory: CTAB  Abdomen: Soft, nontender, nondistended, bowel sounds present  Musculoskeletal: No bilateral pedal edema noted  Skin: Normal  Psychiatry: Normal mood    Data Reviewed: I have personally reviewed following labs and imaging studies  CBC: Recent Labs  Lab 02/25/20 1127 02/26/20 0524 02/27/20 0725 02/28/20 0517 02/29/20 0449  WBC 7.7 7.6 8.0 8.3 7.7  NEUTROABS  --   --   --  5.9 5.2  HGB 7.8* 7.0* 7.6* 7.9* 7.1*  HCT 24.7* 22.3* 23.9* 24.8* 22.8*  MCV 80.5 81.7 81.8 82.4 83.5  PLT 301 263 267 268 373   Basic Metabolic Panel: Recent Labs  Lab 02/25/20 1127 02/26/20 0524 02/27/20 0725 02/28/20 0517 02/29/20 0449  NA 140 139 139 139 138  K 4.2 3.9 3.8 3.9 3.9  CL 105 109 109 110 107  CO2 25 23 22 24 23   GLUCOSE 119* 105* 81 99 71  BUN  55* 47* 47* 43* 41*  CREATININE 4.26* 4.28* 4.42* 4.57* 4.30*  CALCIUM 8.1* 7.5* 7.7* 7.9* 8.1*  PHOS  --   --  4.7* 4.8* 4.9*   GFR: Estimated Creatinine Clearance: 30.3 mL/min (A) (by C-G formula based on SCr of 4.3 mg/dL (H)). Liver Function Tests: Recent Labs  Lab 02/25/20 1127 02/27/20 0725 02/28/20 0517 02/29/20 0449  AST 17  --   --   --   ALT 18  --   --   --   ALKPHOS 70  --   --   --   BILITOT 0.8  --   --   --   PROT 6.5  --   --   --   ALBUMIN 2.9* 2.6* 2.7* 2.6*   Recent Labs  Lab 02/25/20 1127  LIPASE 21   No results for input(s): AMMONIA in the last 168 hours. Coagulation Profile: Recent Labs  Lab 02/29/20 0843  INR 1.0   Cardiac Enzymes: No results for input(s): CKTOTAL, CKMB, CKMBINDEX, TROPONINI in the last 168 hours. BNP (last 3 results) No results for input(s): PROBNP in the last 8760 hours. HbA1C: No results for input(s): HGBA1C in the last 72 hours. CBG: Recent Labs  Lab 02/28/20 1150 02/28/20 1725 02/28/20 2217 02/29/20 0723 02/29/20 1109  GLUCAP 87 96 93 77 83   Lipid Profile: No results for input(s): CHOL, HDL, LDLCALC, TRIG, CHOLHDL, LDLDIRECT in the last 72 hours. Thyroid Function Tests: No results for input(s): TSH, T4TOTAL, FREET4, T3FREE, THYROIDAB in the last 72 hours. Anemia Panel: No results for input(s): VITAMINB12, FOLATE, FERRITIN, TIBC, IRON, RETICCTPCT in the last 72 hours. Sepsis Labs: No results for input(s): PROCALCITON, LATICACIDVEN in the last 168 hours.  Recent Results (from the past 240 hour(s))  SARS Coronavirus 2 by RT PCR (hospital order, performed in [facility] hospital lab) Nasopharyngeal Nasopharyngeal Swab     Status: None   Collection Time: 02/25/20 11:49 AM   Specimen: Nasopharyngeal Swab  Result Value Ref Range Status   SARS Coronavirus 2 NEGATIVE NEGATIVE Final    Comment: (NOTE) SARS-CoV-2 target nucleic acids are NOT DETECTED.  The SARS-CoV-2 RNA is generally detectable in upper and  lower respiratory specimens during the acute phase of infection. The lowest concentration of SARS-CoV-2 viral copies this assay can detect is 250 copies / mL. A negative result does not preclude SARS-CoV-2 infection and should not be used as the sole basis for treatment or other patient management decisions.  A negative result may occur with improper specimen collection / handling, submission of specimen other than nasopharyngeal swab, presence of viral mutation(s) within the areas targeted by this assay, and inadequate number of viral  copies (<250 copies / mL). A negative result must be combined with clinical observations, patient history, and epidemiological information.  Fact Sheet for Patients:   StrictlyIdeas.no  Fact Sheet for Healthcare Providers: BankingDealers.co.za  This test is not yet approved or  cleared by the [place] FDA and has been authorized for detection and/or diagnosis of SARS-CoV-2 by FDA under an Emergency Use Authorization (EUA).  This EUA will remain in effect (meaning this test can be used) for the duration of the COVID-19 declaration under Section 564(b)(1) of the Act, 21 U.S.C. section 360bbb-3(b)(1), unless the authorization is terminated or revoked sooner.  Performed at [facility], [place] [address], [place], [place] [address]          Radiology Studies: CT ABDOMEN LIMITED WO CONTRAST  Result Date: 02/29/2020 CLINICAL DATA:  49 year old with acute kidney injury and request for a renal biopsy. Kidneys are difficult to visualize with ultrasound. Therefore, patient was taken to the CT suite for renal biopsy. EXAM: CT ABDOMEN WITHOUT CONTRAST LIMITED TECHNIQUE: Multidetector CT imaging of the abdomen was performed following the standard protocol without IV contrast. Procedure was performed prone in anticipation of biopsy. COMPARISON:  Renal ultrasound 02/26/2020 FINDINGS: Lower chest:  Basilar lung densities are suggestive for atelectasis. There may be trace right pleural fluid. Hepatobiliary: Partial visualization of the liver. No gross abnormality to the gallbladder. Pancreas: Unremarkable Spleen: Normal in size without focal abnormality. Adrenals/Urinary Tract: Adrenal glands appear to be within normal limits. 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 Position and configuration of the left kidney/moiety is near anatomic. Right kidney/moiety is malrotated and more caudal than the left. Right renal pelvis is anterior to the renal parenchyma. Mild fullness in the right renal pelvis. Partial visualization of the urinary bladder. Stomach/Bowel: Visualized bowel structures are unremarkable. Vascular/Lymphatic: Atherosclerotic calcifications involving the SMA. No evidence for an aortic aneurysm. No significant abdominal or pelvic lymphadenopathy. Musculoskeletal: No acute abnormality. IMPRESSION: 1. Horseshoe kidney. 2. Based on the horseshoe kidney and patient's elevated blood pressure, random renal biopsy was not performed. Electronically Signed   By: Markus Daft M.D.   On: 02/29/2020 16:09        Scheduled Meds:  sodium chloride   Intravenous Once   carvedilol  6.25 mg Oral BID WC   gelatin adsorbable       insulin aspart  0-9 Units Subcutaneous TID WC   insulin aspart  2 Units Subcutaneous TID WC   insulin detemir  5 Units Subcutaneous BID   isosorbide-hydrALAZINE  1 tablet Oral TID   lidocaine       lidocaine       Continuous Infusions:    LOS: 4 days     Alma Friendly, MD 02/29/2020, 4:13 PM

## 2020-02-29 NOTE — Progress Notes (Signed)
Pt leaving to IR

## 2020-02-29 NOTE — Sedation Documentation (Signed)
Patient transported by this RN to CT via bed as per Dr Anselm Pancoast. Patient responds to voice and moved self from bed to CT table.

## 2020-02-29 NOTE — Progress Notes (Signed)
Patient ID: [name], male   DOB: 09/14/1985, 34 y.o.   MRN: [id number]  Patient was scheduled for image guided random renal biopsy.  Both kidneys were evaluated with [place].  Right kidney was difficult to see and left kidney had some bowel overlying it.  Therefore, patient was taken to CT. CT demonstrates a horseshoe kidney.  Left mid kidney is amendable for biopsy but probably increased risk of bleeding.  Patient's blood pressure remains elevated with diastolic pressures in upper 90s.  The renal biopsy was canceled based on increased risk of bleeding due to blood pressure and horseshoes kidney.  Discussed with Dr. Candiss Norse in Nephrology.

## 2020-02-29 NOTE — Sedation Documentation (Signed)
Procedure stopped per Dr Anselm Pancoast.

## 2020-02-29 NOTE — Progress Notes (Signed)
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 May very well be secondary to progression of disease but I would like to prove that especially given his age hence biopsy was ordered. Despite this, unable to get a biopsy today given horseshoe kidney and poor windows and BP was a limiting factor. Therefore, if renal function stable would be okay to discharge him home if renal function remains stable especially as he is not having uremic symptoms nor is there an indication to start dialysis right now. -upc 2.7g, uacr 2328 -renal ultrasound neg for obstruction, interestingly prominent sized kidneys (left>right) which could be from diabetes along with a suspicious area of cortical scarring in the mid lower right kidney. Discussed with IR today (Dr. Anselm Pancoast) who demonstrated that he has a horseshoe kidney on CT. -given hematuria will proceed with ANA and ANCA--> negative -will contact our office for follow up appt -Continue to monitor daily Cr, Dose meds for GFR<15 -Monitor Daily I/Os, Daily weight  -Maintain MAP>65 for optimal renal perfusion.  -Agree with holding ACE-I, avoid further nephrotoxins including NSAIDS, Morphine.  Unless absolutely necessary, avoid CT with contrast and/or MRI with gadolinium.     Pulmonary edema -stopped lasix, lungs clear on exam on RA. Give as needed  Proteinuria, macroalbuminuria, workup as above -spep w/ if wnl, FLC ratio 2.96 -hep panel and hiv neg  Hematuria -ana and anca negative. Complements wnl. Dsdna ab wnl  Hypertension, improved (uncontrolled in the setting of noncompliance/nonadherence): -on coreg and bidil -can uptitrate coreg to 12.5mg  bid as the next step if needed  Worsening anemia (normocytic) -Neg  hemolytic panel. plts stable -check iron panel--> will give venofer -Transfuse for Hgb<7 g/dL  Uncontrolled Diabetes Mellitus -mgmt per primary service  Gean Quint, MD [facility]  Subjective:    Unable to do biopsy today given horseshoe kidney, difficult window, and high diastolic pressures therefore procedure aborted (have been in contact with IR) which is reasonable. Patient did receive moderate sedation and currently tired. No complaints, no acute events.  Objective:   BP 129/65 (BP Location: Left Arm)   Pulse 82   Temp 97.7 F (36.5 C)   Resp 15   Ht 6' (1.829 m)   Wt 106.6 kg   SpO2 90%   BMI 31.87 kg/m   Intake/Output Summary (Last 24 hours) at 02/29/2020 1617 Last data filed at 02/29/2020 1351 Gross per 24 hour  Intake 242.93 ml  Output 0 ml  Net 242.93 ml   Weight change:   Physical Exam: Gen:nad, well appearing CVS:s1s2, rrr Resp:clear DQQ:IWLN, nt/nd, bs+ Ext: no LE edema Neuro: speech clear and coherent, aaox3, no focal deficits  Imaging: CT ABDOMEN LIMITED WO CONTRAST  Result Date: 02/29/2020 CLINICAL DATA:  34 year old with acute kidney injury and request for a renal biopsy. Kidneys are difficult to visualize with ultrasound. Therefore, patient was taken to the CT suite for renal biopsy. EXAM: CT ABDOMEN WITHOUT CONTRAST LIMITED TECHNIQUE: Multidetector CT imaging of the abdomen was performed following the standard protocol without IV contrast. Procedure was performed prone in anticipation of biopsy. COMPARISON:  Renal ultrasound 02/26/2020 FINDINGS: Lower chest: Basilar lung densities are suggestive for atelectasis. There may be trace right pleural fluid. Hepatobiliary: Partial visualization of the liver. No gross abnormality to the gallbladder. Pancreas: Unremarkable  Spleen: Normal in size without focal abnormality. Adrenals/Urinary Tract: Adrenal glands appear to be within normal limits. Horseshoe kidney configuration with renal fusion in  the lower abdomen. Position and configuration of the left kidney/moiety is near anatomic. Right kidney/moiety is malrotated and more caudal than the left. Right renal pelvis is anterior to the renal parenchyma. Mild fullness in the right renal pelvis. Partial visualization of the urinary bladder. Stomach/Bowel: Visualized bowel structures are unremarkable. Vascular/Lymphatic: Atherosclerotic calcifications involving the SMA. No evidence for an aortic aneurysm. No significant abdominal or pelvic lymphadenopathy. Musculoskeletal: No acute abnormality. IMPRESSION: 1. Horseshoe kidney. 2. Based on the horseshoe kidney and patient's elevated blood pressure, random renal biopsy was not performed. Electronically Signed   By: Markus Daft M.D.   On: 02/29/2020 16:09    Labs: BMET Recent Labs  Lab 02/25/20 1127 02/26/20 0524 02/27/20 0725 02/28/20 0517 02/29/20 0449  NA 140 139 139 139 138  K 4.2 3.9 3.8 3.9 3.9  CL 105 109 109 110 107  CO2 25 23 22 24 23   GLUCOSE 119* 105* 81 99 71  BUN 55* 47* 47* 43* 41*  CREATININE 4.26* 4.28* 4.42* 4.57* 4.30*  CALCIUM 8.1* 7.5* 7.7* 7.9* 8.1*  PHOS  --   --  4.7* 4.8* 4.9*   CBC Recent Labs  Lab 02/26/20 0524 02/27/20 0725 02/28/20 0517 02/29/20 0449  WBC 7.6 8.0 8.3 7.7  NEUTROABS  --   --  5.9 5.2  HGB 7.0* 7.6* 7.9* 7.1*  HCT 22.3* 23.9* 24.8* 22.8*  MCV 81.7 81.8 82.4 83.5  PLT 263 267 268 276    Medications:    . sodium chloride   Intravenous Once  . carvedilol  6.25 mg Oral BID WC  . gelatin adsorbable      . insulin aspart  0-9 Units Subcutaneous TID WC  . insulin aspart  2 Units Subcutaneous TID WC  . insulin detemir  5 Units Subcutaneous BID  . isosorbide-hydrALAZINE  1 tablet Oral TID  . lidocaine      . lidocaine

## 2020-03-01 ENCOUNTER — Inpatient Hospital Stay (HOSPITAL_COMMUNITY): Payer: BC Managed Care – PPO

## 2020-03-01 LAB — CBC WITH DIFFERENTIAL/PLATELET
Abs Immature Granulocytes: 0.02 10*3/uL (ref 0.00–0.07)
Basophils Absolute: 0 10*3/uL (ref 0.0–0.1)
Basophils Relative: 0 %
Eosinophils Absolute: 0.2 10*3/uL (ref 0.0–0.5)
Eosinophils Relative: 2 %
HCT: 23 % — ABNORMAL LOW (ref 39.0–52.0)
Hemoglobin: 7.1 g/dL — ABNORMAL LOW (ref 13.0–17.0)
Immature Granulocytes: 0 %
Lymphocytes Relative: 17 %
Lymphs Abs: 1.2 10*3/uL (ref 0.7–4.0)
MCH: 25.9 pg — ABNORMAL LOW (ref 26.0–34.0)
MCHC: 30.9 g/dL (ref 30.0–36.0)
MCV: 83.9 fL (ref 80.0–100.0)
Monocytes Absolute: 0.6 10*3/uL (ref 0.1–1.0)
Monocytes Relative: 9 %
Neutro Abs: 5 10*3/uL (ref 1.7–7.7)
Neutrophils Relative %: 72 %
Platelets: 272 10*3/uL (ref 150–400)
RBC: 2.74 MIL/uL — ABNORMAL LOW (ref 4.22–5.81)
RDW: 15.6 % — ABNORMAL HIGH (ref 11.5–15.5)
WBC: 7 10*3/uL (ref 4.0–10.5)
nRBC: 0 % (ref 0.0–0.2)

## 2020-03-01 LAB — CBC
HCT: 28 % — ABNORMAL LOW (ref 39.0–52.0)
Hemoglobin: 9.1 g/dL — ABNORMAL LOW (ref 13.0–17.0)
MCH: 27.2 pg (ref 26.0–34.0)
MCHC: 32.5 g/dL (ref 30.0–36.0)
MCV: 83.8 fL (ref 80.0–100.0)
Platelets: 321 10*3/uL (ref 150–400)
RBC: 3.34 MIL/uL — ABNORMAL LOW (ref 4.22–5.81)
RDW: 15.6 % — ABNORMAL HIGH (ref 11.5–15.5)
WBC: 8.4 10*3/uL (ref 4.0–10.5)
nRBC: 0 % (ref 0.0–0.2)

## 2020-03-01 LAB — RENAL FUNCTION PANEL
Albumin: 2.6 g/dL — ABNORMAL LOW (ref 3.5–5.0)
Anion gap: 7 (ref 5–15)
BUN: 41 mg/dL — ABNORMAL HIGH (ref 6–20)
CO2: 24 mmol/L (ref 22–32)
Calcium: 7.9 mg/dL — ABNORMAL LOW (ref 8.9–10.3)
Chloride: 109 mmol/L (ref 98–111)
Creatinine, Ser: 4.52 mg/dL — ABNORMAL HIGH (ref 0.61–1.24)
GFR calc Af Amer: 18 mL/min — ABNORMAL LOW (ref >60)
GFR calc non Af Amer: 16 mL/min — ABNORMAL LOW (ref >60)
Glucose, Bld: 91 mg/dL (ref 70–99)
Phosphorus: 5.1 mg/dL — ABNORMAL HIGH (ref 2.5–4.6)
Potassium: 4.2 mmol/L (ref 3.5–5.1)
Sodium: 140 mmol/L (ref 135–145)

## 2020-03-01 LAB — PREPARE RBC (CROSSMATCH)

## 2020-03-01 LAB — GLUCOSE, CAPILLARY
Glucose-Capillary: 80 mg/dL (ref 70–99)
Glucose-Capillary: 107 mg/dL — ABNORMAL HIGH (ref 70–99)
Glucose-Capillary: 84 mg/dL (ref 70–99)

## 2020-03-01 MED ORDER — FERROUS SULFATE 325 (65 FE) MG PO TABS
325.0000 mg | ORAL_TABLET | Freq: Every day | ORAL | 0 refills | Status: DC
Start: 2020-03-01 — End: 2020-08-07

## 2020-03-01 MED ORDER — SODIUM CHLORIDE 0.9% IV SOLUTION: Freq: Once | INTRAVENOUS | Status: AC

## 2020-03-01 MED ORDER — ISOSORB DINITRATE-HYDRALAZINE 20-37.5 MG PO TABS: 1.0000 | ORAL_TABLET | Freq: Three times a day (TID) | ORAL | 0 refills | Status: AC

## 2020-03-01 MED ORDER — INSULIN DETEMIR 100 UNIT/ML ~~LOC~~ SOLN: 30.0000 [IU] | Freq: Every day | SUBCUTANEOUS | 0 refills | Status: DC

## 2020-03-01 MED ORDER — CARVEDILOL 6.25 MG PO TABS: 6.2500 mg | ORAL_TABLET | Freq: Two times a day (BID) | ORAL | 0 refills | Status: DC

## 2020-03-01 NOTE — Plan of Care (Signed)
  Problem: Education: Goal: Knowledge of General Education information will improve Description: Including pain rating scale, medication(s)/side effects and non-pharmacologic comfort measures Outcome: Adequate for Discharge   Problem: Health Behavior/Discharge Planning: Goal: Ability to manage health-related needs will improve Outcome: Adequate for Discharge   Problem: Clinical Measurements: Goal: Ability to maintain clinical measurements within normal limits will improve Outcome: Adequate for Discharge Goal: Diagnostic test results will improve Outcome: Adequate for Discharge   Problem: Coping: Goal: Level of anxiety will decrease Outcome: Adequate for Discharge   Problem: Pain Managment: Goal: General experience of comfort will improve Outcome: Adequate for Discharge   Problem: Safety: Goal: Ability to remain free from injury will improve Outcome: Adequate for Discharge   Problem: Skin Integrity: Goal: Risk for impaired skin integrity will decrease Outcome: Adequate for Discharge

## 2020-03-01 NOTE — Progress Notes (Signed)
[name] Progress Note    Assessment/ Plan:   AKI, possible underlying CKD3, stable: -CKD can likely be secondary to diabetic nephropathy/secondary FSGS however I am concerned that his AKI is secondary to hypertensive emergency vs collapsing fsgs from HTN +/- diabetes. May very well be secondary to progression of disease but I would like to prove that especially given his age hence biopsy was ordered. 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 Okay from nephrology perspective for discharge. Setting him up for an outpatient follow up appointment at our office in the couple of weeks. -no indication for renal replacement therapy, not exhibiting any uremic symptoms -upc 2.7g, uacr 2328 -given hematuria will proceed with ANA and ANCA--> negative -will contact our office for follow up appt -Continue to monitor daily Cr, Dose meds for GFR<15 -Monitor Daily I/Os, Daily weight  -Maintain MAP>65 for optimal renal perfusion.  -Agree with holding ACE-I, avoid further nephrotoxins including NSAIDS, Morphine.  Unless absolutely necessary, avoid CT with contrast and/or MRI with gadolinium.     Pulmonary edema, improved -stopped lasix, lungs clear on exam on RA. Give as needed  Hemoptysis -etiology unclear, cxr clear. Mgmt per primary service  Proteinuria, macroalbuminuria, workup as above -spep w/ if wnl, FLC ratio 2.96 -hep panel and hiv neg  Hematuria -ana and anca negative. Complements wnl. Dsdna ab wnl  Hypertension, improved & stable (uncontrolled in the setting of noncompliance/nonadherence): -on coreg and bidil -can uptitrate coreg to 12.5mg  bid as the next step if needed  Worsening anemia (normocytic) -Neg hemolytic panel. plts stable -agree with 1u prbc -iron deficient. S/p nulecit 125mg  on 8/19 -Transfuse for Hgb<7 g/dL  Uncontrolled Diabetes Mellitus -mgmt per primary service  James Quint,  MD [facility]  Subjective:    Unable to do biopsy today given horseshoe kidney, difficult window, and high diastolic pressures therefore procedure aborted (have been in contact with IR) which is reasonable. Patient did receive moderate sedation and currently tired. No complaints, no acute events.  Objective:   BP 121/66   Pulse 80   Temp 98.7 F (37.1 C) (Oral)   Resp 17   Ht 6' (1.829 m)   Wt 106.6 kg   SpO2 92%   BMI 31.87 kg/m   Intake/Output Summary (Last 24 hours) at 03/01/2020 1330 Last data filed at 03/01/2020 1443 Gross per 24 hour  Intake 720 ml  Output --  Net 720 ml   Weight change:   Physical Exam: Gen:nad, well appearing CVS:s1s2, rrr Resp:clear XVQ:MGQQ, nt/nd, bs+ Ext: no LE edema Neuro: speech clear and coherent, aaox3, no focal deficits  Imaging: CT ABDOMEN LIMITED WO CONTRAST  Result Date: 02/29/2020 CLINICAL DATA:  34 year old with acute kidney injury and request for a renal biopsy. Kidneys are difficult to visualize with ultrasound. Therefore, patient was taken to the CT suite for renal biopsy. EXAM: CT ABDOMEN WITHOUT CONTRAST LIMITED TECHNIQUE: Multidetector CT imaging of the abdomen was performed following the standard protocol without IV contrast. Procedure was performed prone in anticipation of biopsy. COMPARISON:  Renal ultrasound 02/26/2020 FINDINGS: Lower chest: Basilar lung densities are suggestive for atelectasis. There may be trace right pleural fluid. Hepatobiliary: Partial visualization of the liver. No gross abnormality to the gallbladder. Pancreas: Unremarkable Spleen: Normal in size without focal abnormality. Adrenals/Urinary Tract: Adrenal glands appear to be within normal limits. 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 Position and configuration of the left  kidney/moiety is near anatomic. Right kidney/moiety is malrotated and more caudal than the left. Right renal pelvis is anterior to the renal  parenchyma. Mild fullness in the right renal pelvis. Partial visualization of the urinary bladder. Stomach/Bowel: Visualized bowel structures are unremarkable. Vascular/Lymphatic: Atherosclerotic calcifications involving the SMA. No evidence for an aortic aneurysm. No significant abdominal or pelvic lymphadenopathy. Musculoskeletal: No acute abnormality. IMPRESSION: 1. Horseshoe kidney. 2. Based on the horseshoe kidney and patient's elevated blood pressure, random renal biopsy was not performed. Electronically Signed   By: Markus Daft M.D.   On: 02/29/2020 16:09   DG Chest Port 1 View  Result Date: 03/01/2020 CLINICAL DATA:  Nasal congestion.  Dry cough. EXAM: PORTABLE CHEST 1 VIEW COMPARISON:  February 25, 2020 FINDINGS: The cardiac silhouette remains enlarged. No pneumothorax. No nodules or masses. No focal infiltrates. Possible tiny effusions. IMPRESSION: 1. The cardiac silhouette appears enlarged but stable. Evaluation of true cardiac size is difficult on AP imaging. Cardiac silhouette enlargement could be due to cardiomegaly or pericardial effusion. Recommend clinical correlation. 2. Possible tiny pleural effusions.  No overt edema. Electronically Signed   By: Dorise Bullion III M.D   On: 03/01/2020 11:11    Labs: BMET Recent Labs  Lab 02/25/20 1127 02/26/20 3825 02/27/20 0725 02/28/20 0517 02/29/20 0449 03/01/20 0545  NA 140 139 139 139 138 140  K 4.2 3.9 3.8 3.9 3.9 4.2  CL 105 109 109 110 107 109  CO2 25 23 22 24 23 24   GLUCOSE 119* 105* 81 99 71 91  BUN 55* 47* 47* 43* 41* 41*  CREATININE 4.26* 4.28* 4.42* 4.57* 4.30* 4.52*  CALCIUM 8.1* 7.5* 7.7* 7.9* 8.1* 7.9*  PHOS  --   --  4.7* 4.8* 4.9* 5.1*   CBC Recent Labs  Lab 02/27/20 0725 02/28/20 0517 02/29/20 0449 03/01/20 0545  WBC 8.0 8.3 7.7 7.0  NEUTROABS  --  5.9 5.2 5.0  HGB 7.6* 7.9* 7.1* 7.1*  HCT 23.9* 24.8* 22.8* 23.0*  MCV 81.8 82.4 83.5 83.9  PLT 267 268 276 272    Medications:    . carvedilol  6.25 mg Oral  BID WC  . insulin aspart  0-9 Units Subcutaneous TID WC  . insulin aspart  2 Units Subcutaneous TID WC  . insulin detemir  5 Units Subcutaneous BID  . isosorbide-hydrALAZINE  1 tablet Oral TID

## 2020-03-01 NOTE — Discharge Summary (Addendum)
Discharge Summary  [name] MPN:[id number] DOB: 1986-02-25  PCP: Patient, No Pcp Per  Admit date: 02/25/2020 Discharge date: 03/01/2020  Time spent: 40 mins  Recommendations for Outpatient Follow-up:  1. PCP in 1 week 2. Nephrology as scheduled    Discharge Diagnoses:  Active Hospital Problems   Diagnosis Date Noted  . Dehydration   . Hypertensive urgency 02/25/2020  . AKI (acute kidney injury) ([place]) 12/02/2019  . Essential hypertension 12/02/2019  . Type 2 diabetes mellitus without complication ([place]) 00/86/7619    Resolved Hospital Problems  No resolved problems to display.    Discharge Condition: Stable  Diet recommendation: Renal diet/mod carb  Vitals:   03/01/20 1305 03/01/20 1345  BP: 121/66 138/88  Pulse: 80 80  Resp: 16 17  Temp: 98.7 F (37.1 C) 98 F (36.7 C)  SpO2: 92% 98%    History of present illness:  [name] a 34 y.o.malepast medical history of hypertension and diabetes, was recently discharged from the hospital on 12/05/2019 for acute kidney injury and hypertensive emergency due to noncompliance who comes in to the hospital because he has been vomiting over the past week at least five or six times he denies any diarrhea abdominal pain or chest pain. Patient has not seen a primary care doctor since discharge he relates he quit taking his medication as it was making him feel bad. In the ED, was found to have a blood pressure of 190/129, creatinine 4.26, UA negative chest x-ray showed a medically stable interstitial pulmonary edema.  Patient admitted for further management.    Yesterday, patient reported an episode of dime to penny size hemoptysis, denied any worsening shortness of breath, saturating well on room air, no further episodes this morning.  Denies any chest pain, abdominal pain, nausea/vomiting, fever/chills.  Patient eager to be discharged.   Hospital Course:  Active Problems:   AKI (acute kidney injury) ([place])    Essential hypertension   Type 2 diabetes mellitus without complication (H[facility])   Hypertensive urgency   Dehydration  AKI/progression of CKD stage IIIb Proteinuria, macroalbuminuria No signs of uremia Unknown etiology, likely 2/2 diabetes/hypertensive nephropathy Hepatitis panel and HIV negative Renal ultrasound negative for obstruction Urine noted with hematuria UDS unremarkable ANA/ANCA negative Nephrology consulted, follow-up as an outpatient as creatinine has stabilized  Renal biopsy with IR on 02/29/2020, was canceled based on increased risk of bleeding due to uncontrolled BP and horseshoes kidney PCP with repeat labs in 1 week Nephrology follow-up as scheduled  ?Hemoptysis 1 episode on 02/29/2020 (patient informed me on 03/01/2020) ANA, ANCA negative Chest x-ray unremarkable Advised to return to the ER if recurrent/persistent  Hypertensive crisis/pulmonary edema BP improved, no longer requiring O2 Continue Coreg, bidil  Diabetes mellitus type 2 A1c 6 Continue home regimen  Normocytic anemia of likely chronic kidney disease  Negative hemolytic panel, platelets stable Hemoglobin on a downward trend Anemia panel showed iron 40, sats 17 S/p 1 unit of PRBC on 02/26/2020 and on 03/01/2020 Follow-up with PCP/nephrology        Malnutrition Type:      Malnutrition Characteristics:      Nutrition Interventions:      Estimated body mass index is 31.87 kg/m as calculated from the following:   Height as of this encounter: 6' (1.829 m).   Weight as of this encounter: 106.6 kg.    Procedures:  None  Consultations:  Nephrology  IR    Discharge Exam: BP 138/88   Pulse 80   Temp  69 F (36.7 C) (Oral)   Resp 17   Ht 6' (1.829 m)   Wt 106.6 kg   SpO2 98%   BMI 31.87 kg/m   General: NAD Cardiovascular: S1, S2 present Respiratory: CTA B  Discharge Instructions You were cared for by a hospitalist during your hospital stay. If you have any  questions about your discharge medications or the care you received while you were in the hospital after you are discharged, you can call the unit and asked to speak with the hospitalist on call if the hospitalist that took care of you is not available. Once you are discharged, your primary care physician will handle any further medical issues. Please note that NO REFILLS for any discharge medications will be authorized once you are discharged, as it is imperative that you return to your primary care physician (or establish a relationship with a primary care physician if you do not have one) for your aftercare needs so that they can reassess your need for medications and monitor your lab values.  Discharge Instructions    Diet - low sodium heart healthy   Complete by: As directed    Increase activity slowly   Complete by: As directed      Allergies as of 03/01/2020   No Known Allergies     Medication List    STOP taking these medications   amLODipine 10 MG tablet Commonly known as: NORVASC   hydrALAZINE 50 MG tablet Commonly known as: APRESOLINE     TAKE these medications   carvedilol 6.25 MG tablet Commonly known as: COREG Take 1 tablet (6.25 mg total) by mouth 2 (two) times daily with a meal.   ferrous sulfate 325 (65 FE) MG tablet Take 1 tablet (325 mg total) by mouth daily with breakfast.   insulin detemir 100 UNIT/ML injection Commonly known as: LEVEMIR Inject 0.3 mLs (30 Units total) into the skin daily.   isosorbide-hydrALAZINE 20-37.5 MG tablet Commonly known as: BIDIL Take 1 tablet by mouth 3 (three) times daily.      No Known Allergies  Follow-up Information    [facility]. Go to.   Contact information: [address] [place] [address] [phone]               The results of significant diagnostics from this hospitalization (including imaging, microbiology, ancillary and laboratory) are listed  below for reference.    Significant Diagnostic Studies: DG Abd 1 View  Result Date: 02/26/2020 CLINICAL DATA:  34 year old male with abdominal distension, nausea vomiting. EXAM: ABDOMEN - 1 VIEW COMPARISON:  Portable chest radiograph yesterday. FINDINGS: Portable AP supine view at 1638 hours. Non obstructed bowel gas pattern. External artifact projects over the pelvis on the initial view, not present on image #2. Incidental pelvic phleboliths. Normal abdominal and pelvic visceral contours. No osseous abnormality identified. IMPRESSION: Normal bowel gas pattern. Electronically Signed   By: Genevie Ann M.D.   On: 02/26/2020 16:59   US RENAL  Result Date: 02/26/2020 CLINICAL DATA:  Acute kidney injury. EXAM: RENAL / URINARY TRACT ULTRASOUND COMPLETE COMPARISON:  Renal ultrasound 12/02/2019 FINDINGS: Right Kidney: Renal measurements: 13.9 x 5.0 x 7.2 cm = volume: 260 mL. Diffusely increased renal echogenicity. No mass or hydronephrosis visualized. Suspect an area of cortical scarring in the mid lower kidney. Left Kidney: Renal measurements: 17.6 x 7.0 x 7.3 cm = volume: 474 mL. Diffusely increased renal echogenicity. No mass or hydronephrosis visualized. Bladder: Appears normal for  degree of bladder distention. Other: None. IMPRESSION: 1. No obstructive uropathy. 2. Diffusely increased renal echogenicity consistent with chronic medical renal disease. Prominent sized kidneys, with left larger than right, also seen on prior exam. 3. Suspect an area of cortical scarring in the mid lower right kidney. Electronically Signed   By: Keith Rake M.D.   On: 02/26/2020 19:59   CT ABDOMEN LIMITED WO CONTRAST  Result Date: 02/29/2020 CLINICAL DATA:  41 year old with acute kidney injury and request for a renal biopsy. Kidneys are difficult to visualize with ultrasound. Therefore, patient was taken to the CT suite for renal biopsy. EXAM: CT ABDOMEN WITHOUT CONTRAST LIMITED TECHNIQUE: Multidetector CT imaging of the  abdomen was performed following the standard protocol without IV contrast. Procedure was performed prone in anticipation of biopsy. COMPARISON:  Renal ultrasound 02/26/2020 FINDINGS: Lower chest: Basilar lung densities are suggestive for atelectasis. There may be trace right pleural fluid. Hepatobiliary: Partial visualization of the liver. No gross abnormality to the gallbladder. Pancreas: Unremarkable Spleen: Normal in size without focal abnormality. Adrenals/Urinary Tract: Adrenal glands appear to be within normal limits. 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 Position and configuration of the left kidney/moiety is near anatomic. Right kidney/moiety is malrotated and more caudal than the left. Right renal pelvis is anterior to the renal parenchyma. Mild fullness in the right renal pelvis. Partial visualization of the urinary bladder. Stomach/Bowel: Visualized bowel structures are unremarkable. Vascular/Lymphatic: Atherosclerotic calcifications involving the SMA. No evidence for an aortic aneurysm. No significant abdominal or pelvic lymphadenopathy. Musculoskeletal: No acute abnormality. IMPRESSION: 1. Horseshoe kidney. 2. Based on the horseshoe kidney and patient's elevated blood pressure, random renal biopsy was not performed. Electronically Signed   By: Markus Daft M.D.   On: 02/29/2020 16:09   DG Chest Port 1 View  Result Date: 03/01/2020 CLINICAL DATA:  Nasal congestion.  Dry cough. EXAM: PORTABLE CHEST 1 VIEW COMPARISON:  February 25, 2020 FINDINGS: The cardiac silhouette remains enlarged. No pneumothorax. No nodules or masses. No focal infiltrates. Possible tiny effusions. IMPRESSION: 1. The cardiac silhouette appears enlarged but stable. Evaluation of true cardiac size is difficult on AP imaging. Cardiac silhouette enlargement could be due to cardiomegaly or pericardial effusion. Recommend clinical correlation. 2. Possible tiny pleural effusions.  No overt edema.  Electronically Signed   By: Dorise Bullion III M.D   On: 03/01/2020 11:11   DG Chest Portable 1 View  Result Date: 02/25/2020 CLINICAL DATA:  Dyspnea, vomiting EXAM: PORTABLE CHEST 1 VIEW COMPARISON:  12/02/2019 FINDINGS: The lungs are well expanded and are symmetric. Trace interstitial pulmonary edema within the lung bases persists, similar to that noted on prior examination. Mild cardiomegaly, however, has slightly progressed since prior exam. No pneumothorax or pleural effusion. No acute bone abnormality. IMPRESSION: Mild progressive cardiomegaly. Stable trace interstitial pulmonary edema, possibly cardiogenic in nature. Electronically Signed   By: Fidela Salisbury MD   On: 02/25/2020 13:09    Microbiology: Recent Results (from the past 240 hour(s))  SARS Coronavirus 2 by RT PCR (hospital order, performed in [facility] hospital lab) Nasopharyngeal Nasopharyngeal Swab     Status: None   Collection Time: 02/25/20 11:49 AM   Specimen: Nasopharyngeal Swab  Result Value Ref Range Status   SARS Coronavirus 2 NEGATIVE NEGATIVE Final    Comment: (NOTE) SARS-CoV-2 target nucleic acids are NOT DETECTED.  The SARS-CoV-2 RNA is generally detectable in upper and lower respiratory specimens during the acute phase of infection. The lowest concentration of SARS-CoV-2  viral copies this assay can detect is 250 copies / mL. A negative result does not preclude SARS-CoV-2 infection and should not be used as the sole basis for treatment or other patient management decisions.  A negative result may occur with improper specimen collection / handling, submission of specimen other than nasopharyngeal swab, presence of viral mutation(s) within the areas targeted by this assay, and inadequate number of viral copies (<250 copies / mL). A negative result must be combined with clinical observations, patient history, and epidemiological information.  Fact Sheet for Patients:    StrictlyIdeas.no  Fact Sheet for Healthcare Providers: BankingDealers.co.za  This test is not yet approved or  cleared by the [place] FDA and has been authorized for detection and/or diagnosis of SARS-CoV-2 by FDA under an Emergency Use Authorization (EUA).  This EUA will remain in effect (meaning this test can be used) for the duration of the COVID-19 declaration under Section 564(b)(1) of the Act, 21 U.S.C. section 360bbb-3(b)(1), unless the authorization is terminated or revoked sooner.  Performed at [facility], [place] [address], [place], [place] [address]      Labs: Basic Metabolic Panel: Recent Labs  Lab 02/26/20 0524 02/27/20 0725 02/28/20 0517 02/29/20 0449 03/01/20 0545  NA 139 139 139 138 140  K 3.9 3.8 3.9 3.9 4.2  CL 109 109 110 107 109  CO2 23 22 24 23 24   GLUCOSE 105* 81 99 71 91  BUN 47* 47* 43* 41* 41*  CREATININE 4.28* 4.42* 4.57* 4.30* 4.52*  CALCIUM 7.5* 7.7* 7.9* 8.1* 7.9*  PHOS  --  4.7* 4.8* 4.9* 5.1*   Liver Function Tests: Recent Labs  Lab 02/25/20 1127 02/27/20 0725 02/28/20 0517 02/29/20 0449 03/01/20 0545  AST 17  --   --   --   --   ALT 18  --   --   --   --   ALKPHOS 70  --   --   --   --   BILITOT 0.8  --   --   --   --   PROT 6.5  --   --   --   --   ALBUMIN 2.9* 2.6* 2.7* 2.6* 2.6*   Recent Labs  Lab 02/25/20 1127  LIPASE 21   No results for input(s): AMMONIA in the last 168 hours. CBC: Recent Labs  Lab 02/26/20 0524 02/27/20 0725 02/28/20 0517 02/29/20 0449 03/01/20 0545  WBC 7.6 8.0 8.3 7.7 7.0  NEUTROABS  --   --  5.9 5.2 5.0  HGB 7.0* 7.6* 7.9* 7.1* 7.1*  HCT 22.3* 23.9* 24.8* 22.8* 23.0*  MCV 81.7 81.8 82.4 83.5 83.9  PLT 263 267 268 276 272   Cardiac Enzymes: No results for input(s): CKTOTAL, CKMB, CKMBINDEX, TROPONINI in the last 168 hours. BNP: BNP (last 3 results) Recent Labs    12/02/19 0744 02/25/20 1127  BNP 451.7* 694.9*     ProBNP (last 3 results) No results for input(s): PROBNP in the last 8760 hours.  CBG: Recent Labs  Lab 02/29/20 1614 02/29/20 1630 02/29/20 2243 03/01/20 0731 03/01/20 1201  GLUCAP 61* 87 107* 80 107*       Signed:  Alma Friendly, MD [facility] 03/01/2020, 3:51 PM

## 2020-03-02 LAB — BPAM RBC
Blood Product Expiration Date: 202109222359
ISSUE DATE / TIME: 202108211258
Unit Type and Rh: 5100

## 2020-03-02 LAB — TYPE AND SCREEN
ABO/RH(D): O POS
Antibody Screen: NEGATIVE
Unit division: 0

## 2020-03-10 ENCOUNTER — Other Ambulatory Visit: Payer: Self-pay

## 2020-03-10 ENCOUNTER — Emergency Department (HOSPITAL_COMMUNITY)
Admission: EM | Admit: 2020-03-10 | Discharge: 2020-03-10 | Disposition: A | Discharge status: Home / Self Care | Payer: BC Managed Care – PPO | Attending: Emergency Medicine | Admitting: Emergency Medicine

## 2020-03-10 ENCOUNTER — Emergency Department (HOSPITAL_COMMUNITY): Payer: BC Managed Care – PPO

## 2020-03-10 DIAGNOSIS — R0602 Shortness of breath: Secondary | ICD-10-CM | POA: Insufficient documentation

## 2020-03-10 DIAGNOSIS — M7989 Other specified soft tissue disorders: Secondary | ICD-10-CM | POA: Insufficient documentation

## 2020-03-10 DIAGNOSIS — R0789 Other chest pain: Secondary | ICD-10-CM

## 2020-03-10 DIAGNOSIS — Z794 Long term (current) use of insulin: Secondary | ICD-10-CM | POA: Diagnosis not present

## 2020-03-10 DIAGNOSIS — I10 Essential (primary) hypertension: Secondary | ICD-10-CM | POA: Insufficient documentation

## 2020-03-10 DIAGNOSIS — R0989 Other specified symptoms and signs involving the circulatory and respiratory systems: Secondary | ICD-10-CM | POA: Diagnosis not present

## 2020-03-10 DIAGNOSIS — E119 Type 2 diabetes mellitus without complications: Secondary | ICD-10-CM | POA: Diagnosis not present

## 2020-03-10 LAB — BASIC METABOLIC PANEL
Anion gap: 9 (ref 5–15)
BUN: 49 mg/dL — ABNORMAL HIGH (ref 6–20)
CO2: 21 mmol/L — ABNORMAL LOW (ref 22–32)
Calcium: 8 mg/dL — ABNORMAL LOW (ref 8.9–10.3)
Chloride: 105 mmol/L (ref 98–111)
Creatinine, Ser: 4.44 mg/dL — ABNORMAL HIGH (ref 0.61–1.24)
GFR calc Af Amer: 19 mL/min — ABNORMAL LOW (ref >60)
GFR calc non Af Amer: 16 mL/min — ABNORMAL LOW (ref >60)
Glucose, Bld: 123 mg/dL — ABNORMAL HIGH (ref 70–99)
Potassium: 4.4 mmol/L (ref 3.5–5.1)
Sodium: 135 mmol/L (ref 135–145)

## 2020-03-10 LAB — TROPONIN I (HIGH SENSITIVITY)
Troponin I (High Sensitivity): 8 ng/L (ref <18)
Troponin I (High Sensitivity): 8 ng/L (ref <18)

## 2020-03-10 LAB — CBC
HCT: 27 % — ABNORMAL LOW (ref 39.0–52.0)
Hemoglobin: 8.3 g/dL — ABNORMAL LOW (ref 13.0–17.0)
MCH: 25.7 pg — ABNORMAL LOW (ref 26.0–34.0)
MCHC: 30.7 g/dL (ref 30.0–36.0)
MCV: 83.6 fL (ref 80.0–100.0)
Platelets: 328 10*3/uL (ref 150–400)
RBC: 3.23 MIL/uL — ABNORMAL LOW (ref 4.22–5.81)
RDW: 15 % (ref 11.5–15.5)
WBC: 7.8 10*3/uL (ref 4.0–10.5)
nRBC: 0 % (ref 0.0–0.2)

## 2020-03-10 LAB — BRAIN NATRIURETIC PEPTIDE: B Natriuretic Peptide: 617 pg/mL — ABNORMAL HIGH (ref 0.0–100.0)

## 2020-03-10 MED ORDER — FUROSEMIDE 10 MG/ML IJ SOLN
40.0000 mg | Freq: Once | INTRAMUSCULAR | Status: AC
  Administered 2020-03-10: 40 mg via INTRAVENOUS
  Filled 2020-03-10: qty 4

## 2020-03-10 NOTE — ED Triage Notes (Signed)
Pt reports 1.5 weeks of intermittent, central, squeezing chest pain and DOE. Bilateral legs are swollen. Went to PCP who advised to come here if pain recurred. Hx hypertension, did not take rx for same yesterday or today.

## 2020-03-10 NOTE — Discharge Instructions (Signed)
As we discussed, your work-up today was reassuring but did show that you have some signs of fluid overload.  As we discussed, it is very important that you control your high blood pressure by taking your medications.  Additionally, take the medication that was prescribed by your kidney doctor to help with fluid overload.  As we discussed, it is important to minimize your salt intake.  I provided you some information to help with dietary choices.  Follow-up with referred to cardiology.  Return the emergency department any worsening chest pain, difficulty breathing or any other worsening concerning symptoms.

## 2020-03-10 NOTE — ED Provider Notes (Signed)
[name] Provider Note   CSN: [id number] Arrival date & time: 03/10/20  1351     History Chief Complaint  Patient presents with  . Chest Pain  . Shortness of Breath  . Leg Swelling    [name] is a 34 y.o. male history of diabetes, hypertension who presents for evaluation of 1.5-week of intermittent chest pain, dyspnea on exertion, bilateral lower extremity swelling.  He reports that he was recently admitted to the hospital for high blood pressure.  He was discharged on 03/01/2020.  He reports that since then, he has had intermittent chest pain.  He states that this pain occurs randomly.  He states that it is a sharp pain in the left chest.  It does not radiate.  He has no associated diaphoresis, nausea/vomiting.  He states he has had it both with activity and with rest and states that it is not associated with any particular exertional component.  His chest pain is not worse with deep inspiration.  He noticed that he was doing his laundry and had to carry his laundry basket up the stairs and became very fatigued and tired.  He also reports that this made him short of breath.  He states that he does not take any medication for the pain.  He states that it will eventually ease off by itself.  He states he is also had bilateral lower extremity edema.  He states that this has been ongoing for the same amount of time.  He states that sometimes, he will have leg swelling but then it usually resolves.  He states the legs have not been red or hot.  He thinks he has gained about 20-25 pounds over the last month or so.  He does not smoke.  Denies any alcohol history. He denies any exogenous hormone use, recent immobilization, prior history of DVT/PE, recent surgery, leg swelling, or long travel.  He does not any prior history of MI.  No family history of 28.  The history is provided by the patient.       Past Medical History:  Diagnosis Date  . Diabetes  mellitus   . High cholesterol   . Hypertension     Patient Active Problem List   Diagnosis Date Noted  . Dehydration   . Hypertensive urgency 02/25/2020  . AKI (acute kidney injury) ([place]) 12/02/2019  . Anemia 12/02/2019  . Essential hypertension 12/02/2019  . SOB (shortness of breath) 12/02/2019  . Type 2 diabetes mellitus without complication ([place]) 40/98/1191    Past Surgical History:  Procedure Laterality Date  . FRACTURE SURGERY    . I & D EXTREMITY Left 07/16/2014   Procedure: IRRIGATION AND DEBRIDEMENT EXTREMITY/LEFT INDEX FINGER;  Surgeon: Leanora Cover, MD;  Location: [place];  Service: Orthopedics;  Laterality: Left;       Family History  Problem Relation Age of Onset  . Cancer Mother   . Stroke Father   . Diabetes Father   . Hypertension Father     Social History   Tobacco Use  . Smoking status: Never Smoker  . Smokeless tobacco: Never Used  Vaping Use  . Vaping Use: Never used  Substance Use Topics  . Alcohol use: Yes  . Drug use: No    Home Medications Prior to Admission medications   Medication Sig Start Date End Date Taking? Authorizing Provider  acetaminophen (TYLENOL) 500 MG tablet Take 500-1,000 mg by mouth every 6 (six) hours as needed for  mild pain or headache.    Yes [provider]  carvedilol (COREG) 6.25 MG tablet Take 1 tablet (6.25 mg total) by mouth 2 (two) times daily with a meal. 03/01/20 03/31/20 Yes Alma Friendly, MD  diphenhydramine-acetaminophen (TYLENOL PM) 25-500 MG TABS tablet Take 2 tablets by mouth at bedtime as needed (for sleep).   Yes [provider]  ferrous sulfate 325 (65 FE) MG tablet Take 1 tablet (325 mg total) by mouth daily with breakfast. 03/01/20  Yes Alma Friendly, MD  insulin detemir (LEVEMIR) 100 UNIT/ML injection Inject 0.3 mLs (30 Units total) into the skin daily. Patient taking differently: Inject 30 Units into the skin in the morning.  03/01/20  Yes Alma Friendly, MD   isosorbide-hydrALAZINE (BIDIL) 20-37.5 MG tablet Take 1 tablet by mouth 3 (three) times daily. 03/01/20 03/31/20 Yes Alma Friendly, MD    Allergies    Amlodipine and Hydralazine  Review of Systems   Review of Systems  Constitutional: Negative for fever.  Respiratory: Positive for shortness of breath. Negative for cough.   Cardiovascular: Positive for chest pain and leg swelling.  Gastrointestinal: Negative for abdominal pain, nausea and vomiting.  Genitourinary: Negative for dysuria and hematuria.  Neurological: Negative for headaches.  All other systems reviewed and are negative.   Physical Exam Updated Vital Signs BP (!) 175/94   Pulse 83   Temp 98.1 F (36.7 C) (Oral)   Resp 18   SpO2 100%   Physical Exam Vitals and nursing note reviewed.  Constitutional:      Appearance: Normal appearance. He is well-developed.     Comments: Sitting comfortably on examination table  HENT:     Head: Normocephalic and atraumatic.  Eyes:     General: Lids are normal.     Conjunctiva/sclera: Conjunctivae normal.     Pupils: Pupils are equal, round, and reactive to light.  Cardiovascular:     Rate and Rhythm: Normal rate and regular rhythm.     Pulses: Normal pulses.     Heart sounds: Normal heart sounds. No murmur heard.  No friction rub. No gallop.   Pulmonary:     Effort: Pulmonary effort is normal.     Breath sounds: Rales present.     Comments: Faint crackles noted at bilateral bases.  No evidence of respiratory distress. Abdominal:     Palpations: Abdomen is soft. Abdomen is not rigid.     Tenderness: There is no abdominal tenderness. There is no guarding.  Musculoskeletal:        General: Normal range of motion.     Cervical back: Full passive range of motion without pain.     Comments: 2+ pitting edema noted bilateral lower extremities.  No overlying warmth, erythema.  Skin:    General: Skin is warm and dry.     Capillary Refill: Capillary refill takes less than 2  seconds.  Neurological:     Mental Status: He is alert and oriented to person, place, and time.  Psychiatric:        Speech: Speech normal.     ED Results / Procedures / Treatments   Labs (all labs ordered are listed, but only abnormal results are displayed) Labs Reviewed  BASIC METABOLIC PANEL - Abnormal; Notable for the following components:      Result Value   CO2 21 (*)    Glucose, Bld 123 (*)    BUN 49 (*)    Creatinine, Ser 4.44 (*)    Calcium  8.0 (*)    GFR calc non Af Amer 16 (*)    GFR calc Af Amer 19 (*)    All other components within normal limits  CBC - Abnormal; Notable for the following components:   RBC 3.23 (*)    Hemoglobin 8.3 (*)    HCT 27.0 (*)    MCH 25.7 (*)    All other components within normal limits  BRAIN NATRIURETIC PEPTIDE - Abnormal; Notable for the following components:   B Natriuretic Peptide 617.0 (*)    All other components within normal limits  TROPONIN I (HIGH SENSITIVITY)  TROPONIN I (HIGH SENSITIVITY)    EKG EKG Interpretation  Date/Time:  Monday March 10 2020 14:01:54 EDT Ventricular Rate:  84 PR Interval:  176 QRS Duration: 88 QT Interval:  372 QTC Calculation: 439 R Axis:   89 Text Interpretation: Normal sinus rhythm Cannot rule out Anterior infarct , age undetermined Abnormal ECG since last tracing no significant change Confirmed by Noemi Chapel [phone]) on 03/10/2020 3:55:32 PM   Radiology DG Chest 2 View  Result Date: 03/10/2020 CLINICAL DATA:  Chest pain and shortness of breath. LOWER extremity swelling. EXAM: CHEST - 2 VIEW COMPARISON:  02/29/2020 chest radiograph and prior studies FINDINGS: Enlargement of the cardiopericardial silhouette again noted with pulmonary vascular congestion. Mild bibasilar opacities are noted which may represent atelectasis versus airspace disease. No pleural effusion or pneumothorax. No acute bony abnormalities are present. IMPRESSION: 1. Enlargement of the cardiopericardial silhouette with  pulmonary vascular congestion. 2. Mild bibasilar opacities which may represent atelectasis versus airspace disease. Electronically Signed   By: Margarette Canada M.D.   On: 03/10/2020 14:22    Procedures Procedures (including critical care time)  Medications Ordered in ED Medications  furosemide (LASIX) injection 40 mg (40 mg Intravenous Given 03/10/20 1726)    ED Course  I have reviewed the triage vital signs and the nursing notes.  Pertinent labs & imaging results that were available during my care of the patient were reviewed by me and considered in my medical decision making (see chart for details).    MDM Rules/Calculators/A&P                          34 year old male who presents for evaluation of chest pain, bilateral lower extremity swelling, shortness of breath x1.5 weeks.  Recent admission for hypertension on 02/25/2020.  He was discharged on 03/01/20.  At that time, he had signs of AKI consistent with CKD stage III.  He was started on Coreg and BiDil.  He comes in today for symptoms have been ongoing for 1.5 weeks.  On arrival, he is afebrile, nontoxic-appearing.  He is slightly hypertensive.  On exam, he has some faint crackles noted at the bases.  He has 2+ pitting edema noted bilateral lower extremities.  Concern for fluid overload such as he has excessive exacerbation.  History/physical exam concerning for DVT, cellulitis.  Suspicion for ACS etiology as it sounds atypical.  Additionally, do not suspect PE.  He does not have any risk factors and is not tachycardic or hypoxic here in the ED.  Labs ordered at triage.  He had a CMP done on 02/25/2020 that showed a creatinine of 4.26 and BUN of 55.  His last blood work was about 3 months ago which showed a BUN/creatinine of 32/2.73 respectively.  BMP shows BUN of 49, creatinine 4.44.  CBC shows no leukocytosis.  Hemoglobin is 8.3.  This is consistent  with his baseline.  Troponin is negative.  Chest x-ray shows enlargement of the  cardiopericardial silhouette with pulmonary vascular congestion.  BNP is elevated at 617.  He has had previous elevations in his BNP previously.  He was 400 about 2 weeks ago.  Delta troponin is negative.  Patient given a dose of IV Lasix here in the ED.  On reevaluation.  Patient is sitting comfortably on bed.  He reports feeling better.  I discussed with him at length regarding blood pressure control by taking his medications.  Additionally, he states that his kidney doctor saw him this morning and started him on 20 mg of Lasix which he has not had a chance to take yet.  I discussed with him the importance of his diet changes, including limiting his salt intake as this can contribute to fluid overload.  At this time, patient is well-appearing after diuresis with no signs of hypoxia, respiratory distress.  Feel that patient can be managed from an outpatient basis.  At this time, given 2 - troponins, reassuring EKG and atypical nature, do not feel that this is ACS etiology that would require admission.  We will plan to give him outpatient cardiology follow-up. At this time, patient exhibits no emergent life-threatening condition that require further evaluation in ED. Discussed patient with Dr. Sabra Heck who is agreeable to plan. Patient had ample opportunity for questions and discussion. All patient's questions were answered with full understanding. Strict return precautions discussed. Patient expresses understanding and agreement to plan.    Portions of this note were generated with [employer]. Dictation errors may occur despite best attempts at proofreading.   Final Clinical Impression(s) / ED Diagnoses Final diagnoses:  Leg swelling  Atypical chest pain  Hypertension, unspecified type    Rx / DC Orders ED Discharge Orders    None       Desma Mcgregor 03/10/20 1917    Noemi Chapel, MD 03/15/20 1255

## 2020-03-13 ENCOUNTER — Inpatient Hospital Stay: Payer: BC Managed Care – PPO | Admitting: Critical Care Medicine

## 2020-03-14 ENCOUNTER — Inpatient Hospital Stay: Payer: BC Managed Care – PPO | Admitting: Family Medicine

## 2020-03-24 ENCOUNTER — Other Ambulatory Visit (HOSPITAL_COMMUNITY): Payer: Self-pay | Admitting: *Deleted

## 2020-03-24 NOTE — Discharge Instructions (Signed)

## 2020-03-25 ENCOUNTER — Encounter (HOSPITAL_COMMUNITY): Payer: Self-pay

## 2020-03-25 ENCOUNTER — Inpatient Hospital Stay (HOSPITAL_COMMUNITY)
Admission: RE | Admit: 2020-03-25 | Discharge: 2020-03-25 | Disposition: A | Discharge status: Home / Self Care | Payer: BC Managed Care – PPO | Source: Ambulatory Visit | Attending: Nephrology | Admitting: Nephrology

## 2020-04-01 ENCOUNTER — Encounter (HOSPITAL_COMMUNITY): Payer: BC Managed Care – PPO

## 2020-05-07 ENCOUNTER — Inpatient Hospital Stay (HOSPITAL_COMMUNITY)
Admission: EM | Admit: 2020-05-07 | Discharge: 2020-05-10 | DRG: 683 | Disposition: A | Discharge status: Home / Self Care | Payer: BC Managed Care – PPO | Attending: Internal Medicine | Admitting: Internal Medicine

## 2020-05-07 ENCOUNTER — Encounter (HOSPITAL_COMMUNITY): Payer: Self-pay

## 2020-05-07 ENCOUNTER — Emergency Department (HOSPITAL_COMMUNITY): Payer: BC Managed Care – PPO

## 2020-05-07 ENCOUNTER — Other Ambulatory Visit: Payer: Self-pay

## 2020-05-07 DIAGNOSIS — Z79899 Other long term (current) drug therapy: Secondary | ICD-10-CM | POA: Diagnosis not present

## 2020-05-07 DIAGNOSIS — E1122 Type 2 diabetes mellitus with diabetic chronic kidney disease: Secondary | ICD-10-CM | POA: Diagnosis present

## 2020-05-07 DIAGNOSIS — E1121 Type 2 diabetes mellitus with diabetic nephropathy: Secondary | ICD-10-CM | POA: Diagnosis present

## 2020-05-07 DIAGNOSIS — N289 Disorder of kidney and ureter, unspecified: Secondary | ICD-10-CM | POA: Diagnosis present

## 2020-05-07 DIAGNOSIS — K3184 Gastroparesis: Secondary | ICD-10-CM | POA: Diagnosis present

## 2020-05-07 DIAGNOSIS — R112 Nausea with vomiting, unspecified: Secondary | ICD-10-CM | POA: Diagnosis present

## 2020-05-07 DIAGNOSIS — E119 Type 2 diabetes mellitus without complications: Secondary | ICD-10-CM

## 2020-05-07 DIAGNOSIS — N189 Chronic kidney disease, unspecified: Secondary | ICD-10-CM

## 2020-05-07 DIAGNOSIS — Z20822 Contact with and (suspected) exposure to covid-19: Secondary | ICD-10-CM | POA: Diagnosis present

## 2020-05-07 DIAGNOSIS — N179 Acute kidney failure, unspecified: Secondary | ICD-10-CM | POA: Diagnosis present

## 2020-05-07 DIAGNOSIS — I120 Hypertensive chronic kidney disease with stage 5 chronic kidney disease or end stage renal disease: Secondary | ICD-10-CM | POA: Diagnosis present

## 2020-05-07 DIAGNOSIS — D631 Anemia in chronic kidney disease: Secondary | ICD-10-CM | POA: Diagnosis present

## 2020-05-07 DIAGNOSIS — Q631 Lobulated, fused and horseshoe kidney: Secondary | ICD-10-CM

## 2020-05-07 DIAGNOSIS — Z888 Allergy status to other drugs, medicaments and biological substances status: Secondary | ICD-10-CM

## 2020-05-07 DIAGNOSIS — Z794 Long term (current) use of insulin: Secondary | ICD-10-CM | POA: Diagnosis not present

## 2020-05-07 DIAGNOSIS — E7800 Pure hypercholesterolemia, unspecified: Secondary | ICD-10-CM | POA: Diagnosis present

## 2020-05-07 DIAGNOSIS — Z8249 Family history of ischemic heart disease and other diseases of the circulatory system: Secondary | ICD-10-CM

## 2020-05-07 DIAGNOSIS — Z833 Family history of diabetes mellitus: Secondary | ICD-10-CM

## 2020-05-07 DIAGNOSIS — D509 Iron deficiency anemia, unspecified: Secondary | ICD-10-CM | POA: Diagnosis present

## 2020-05-07 DIAGNOSIS — E1143 Type 2 diabetes mellitus with diabetic autonomic (poly)neuropathy: Secondary | ICD-10-CM | POA: Diagnosis present

## 2020-05-07 DIAGNOSIS — N186 End stage renal disease: Secondary | ICD-10-CM | POA: Diagnosis present

## 2020-05-07 DIAGNOSIS — E1165 Type 2 diabetes mellitus with hyperglycemia: Secondary | ICD-10-CM | POA: Diagnosis present

## 2020-05-07 DIAGNOSIS — I10 Essential (primary) hypertension: Secondary | ICD-10-CM | POA: Diagnosis not present

## 2020-05-07 DIAGNOSIS — N184 Chronic kidney disease, stage 4 (severe): Secondary | ICD-10-CM | POA: Diagnosis not present

## 2020-05-07 DIAGNOSIS — E11319 Type 2 diabetes mellitus with unspecified diabetic retinopathy without macular edema: Secondary | ICD-10-CM | POA: Diagnosis present

## 2020-05-07 DIAGNOSIS — N250 Renal osteodystrophy: Secondary | ICD-10-CM | POA: Diagnosis present

## 2020-05-07 DIAGNOSIS — E109 Type 1 diabetes mellitus without complications: Secondary | ICD-10-CM

## 2020-05-07 LAB — COMPREHENSIVE METABOLIC PANEL
ALT: 18 U/L (ref 0–44)
AST: 14 U/L — ABNORMAL LOW (ref 15–41)
Albumin: 2.9 g/dL — ABNORMAL LOW (ref 3.5–5.0)
Alkaline Phosphatase: 73 U/L (ref 38–126)
Anion gap: 14 (ref 5–15)
BUN: 52 mg/dL — ABNORMAL HIGH (ref 6–20)
CO2: 22 mmol/L (ref 22–32)
Calcium: 8.3 mg/dL — ABNORMAL LOW (ref 8.9–10.3)
Chloride: 105 mmol/L (ref 98–111)
Creatinine, Ser: 6.5 mg/dL — ABNORMAL HIGH (ref 0.61–1.24)
GFR, Estimated: 11 mL/min — ABNORMAL LOW (ref >60)
Glucose, Bld: 118 mg/dL — ABNORMAL HIGH (ref 70–99)
Potassium: 4.9 mmol/L (ref 3.5–5.1)
Sodium: 141 mmol/L (ref 135–145)
Total Bilirubin: 0.7 mg/dL (ref 0.3–1.2)
Total Protein: 6.8 g/dL (ref 6.5–8.1)

## 2020-05-07 LAB — CK: Total CK: 967 U/L — ABNORMAL HIGH (ref 49–397)

## 2020-05-07 LAB — CBC
HCT: 22.2 % — ABNORMAL LOW (ref 39.0–52.0)
Hemoglobin: 6.9 g/dL — CL (ref 13.0–17.0)
MCH: 26.1 pg (ref 26.0–34.0)
MCHC: 31.1 g/dL (ref 30.0–36.0)
MCV: 84.1 fL (ref 80.0–100.0)
Platelets: 307 10*3/uL (ref 150–400)
RBC: 2.64 MIL/uL — ABNORMAL LOW (ref 4.22–5.81)
RDW: 14.6 % (ref 11.5–15.5)
WBC: 12.9 10*3/uL — ABNORMAL HIGH (ref 4.0–10.5)
nRBC: 0 % (ref 0.0–0.2)

## 2020-05-07 LAB — RESPIRATORY PANEL BY RT PCR (FLU A&B, COVID)
Influenza A by PCR: NEGATIVE
Influenza B by PCR: NEGATIVE
SARS Coronavirus 2 by RT PCR: NEGATIVE

## 2020-05-07 LAB — PREPARE RBC (CROSSMATCH)

## 2020-05-07 LAB — LIPASE, BLOOD: Lipase: 19 U/L (ref 11–51)

## 2020-05-07 LAB — RETICULOCYTES
Immature Retic Fract: 20.9 % — ABNORMAL HIGH (ref 2.3–15.9)
RBC.: 2.37 MIL/uL — ABNORMAL LOW (ref 4.22–5.81)
Retic Count, Absolute: 24.4 10*3/uL (ref 19.0–186.0)
Retic Ct Pct: 1 % (ref 0.4–3.1)

## 2020-05-07 MED ORDER — HEPARIN SODIUM (PORCINE) 5000 UNIT/ML IJ SOLN
5000.0000 [IU] | Freq: Three times a day (TID) | INTRAMUSCULAR | Status: DC
  Administered 2020-05-08 – 2020-05-09 (×4): 5000 [IU] via SUBCUTANEOUS
  Filled 2020-05-07 (×3): qty 1

## 2020-05-07 MED ORDER — INSULIN DETEMIR 100 UNIT/ML ~~LOC~~ SOLN
15.0000 [IU] | Freq: Every day | SUBCUTANEOUS | Status: DC
  Administered 2020-05-08 – 2020-05-10 (×3): 15 [IU] via SUBCUTANEOUS
  Filled 2020-05-07 (×3): qty 0.15

## 2020-05-07 MED ORDER — SODIUM CHLORIDE 0.9 % IV SOLN: INTRAVENOUS | Status: DC

## 2020-05-07 MED ORDER — ACETAMINOPHEN 325 MG PO TABS: 650.0000 mg | ORAL_TABLET | Freq: Four times a day (QID) | ORAL | Status: DC | PRN

## 2020-05-07 MED ORDER — SODIUM CHLORIDE 0.9 % IV BOLUS
500.0000 mL | Freq: Once | INTRAVENOUS | Status: AC
  Administered 2020-05-07: 500 mL via INTRAVENOUS

## 2020-05-07 MED ORDER — ONDANSETRON HCL 4 MG/2ML IJ SOLN
4.0000 mg | Freq: Once | INTRAMUSCULAR | Status: AC
  Administered 2020-05-07: 4 mg via INTRAVENOUS
  Filled 2020-05-07: qty 2

## 2020-05-07 MED ORDER — ONDANSETRON HCL 4 MG PO TABS: 4.0000 mg | ORAL_TABLET | Freq: Four times a day (QID) | ORAL | Status: DC | PRN

## 2020-05-07 MED ORDER — LABETALOL HCL 5 MG/ML IV SOLN: 10.0000 mg | INTRAVENOUS | Status: DC | PRN

## 2020-05-07 MED ORDER — SODIUM CHLORIDE 0.9% IV SOLUTION: Freq: Once | INTRAVENOUS | Status: DC

## 2020-05-07 MED ORDER — ACETAMINOPHEN 650 MG RE SUPP: 650.0000 mg | Freq: Four times a day (QID) | RECTAL | Status: DC | PRN

## 2020-05-07 MED ORDER — FERROUS SULFATE 325 (65 FE) MG PO TABS
325.0000 mg | ORAL_TABLET | Freq: Every day | ORAL | Status: DC
  Administered 2020-05-08 – 2020-05-10 (×3): 325 mg via ORAL
  Filled 2020-05-07 (×3): qty 1

## 2020-05-07 MED ORDER — ACETAMINOPHEN 325 MG PO TABS
650.0000 mg | ORAL_TABLET | Freq: Once | ORAL | Status: AC
  Administered 2020-05-07: 650 mg via ORAL
  Filled 2020-05-07: qty 2

## 2020-05-07 MED ORDER — INSULIN ASPART 100 UNIT/ML ~~LOC~~ SOLN
0.0000 [IU] | Freq: Three times a day (TID) | SUBCUTANEOUS | Status: DC
  Administered 2020-05-08: 1 [IU] via SUBCUTANEOUS
  Filled 2020-05-07: qty 0.09

## 2020-05-07 MED ORDER — CARVEDILOL 6.25 MG PO TABS
6.2500 mg | ORAL_TABLET | Freq: Two times a day (BID) | ORAL | Status: DC
  Administered 2020-05-08 – 2020-05-10 (×4): 6.25 mg via ORAL
  Filled 2020-05-07 (×2): qty 1
  Filled 2020-05-07: qty 2
  Filled 2020-05-07: qty 1
  Filled 2020-05-07: qty 2

## 2020-05-07 MED ORDER — ONDANSETRON HCL 4 MG/2ML IJ SOLN
4.0000 mg | Freq: Four times a day (QID) | INTRAMUSCULAR | Status: DC | PRN
  Filled 2020-05-07: qty 2

## 2020-05-07 NOTE — H&P (Signed)
History and Physical    DARREN CALDRON JOA:[id number] DOB: 06-01-1986 DOA: 05/07/2020  PCP: Pcp, No  Patient coming from: Home  I have personally briefly reviewed patient's old medical records in [place]  Chief Complaint: N/V/D  HPI: [name] is a 34 y.o. male with medical history significant of DM, HTN, CKD.  Pt had admission in Aug with creat 4.x up from 2.x earlier this year.  Initially believed to be AKI but ultimately suspected to be progression of CKD.  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  See Dr. Candiss Norse note on 8/21 for more details.  Pt had 1 office visit follow up with nephrologist.  Supposed to be seen again but hasnt been able to get in he says.  He presents to the ED today with c/o N/V for past several days.  Vomiting NBNB.  No abd pain.  No dysuria.  Symptoms are persistent, says he cant keep anything down.  No treatment tried PTA.  Does have BLE swelling that is ongoing, had gotten better after last hospital stay for a bit but now worse again.  No fever, does have some muscle aches.  No cough nor congestion.   ED Course: Given zofran and 500cc NS bolus.  HGB 6.9 down slightly from prior baseline running in the 7s last admit.  Creat is up to 6.5 today.  BUN 52.  K 4.9.   Review of Systems: As per HPI, otherwise all review of systems negative.  Past Medical History:  Diagnosis Date  . Diabetes mellitus   . High cholesterol   . Hypertension     Past Surgical History:  Procedure Laterality Date  . FRACTURE SURGERY    . I & D EXTREMITY Left 07/16/2014   Procedure: IRRIGATION AND DEBRIDEMENT EXTREMITY/LEFT INDEX FINGER;  Surgeon: Leanora Cover, MD;  Location: [facility];  Service: Orthopedics;  Laterality: Left;     reports that he has never smoked. He has never used smokeless tobacco. He reports current alcohol use. He reports that he does not use drugs.  Allergies  Allergen Reactions   . Amlodipine Nausea And Vomiting and Other (See Comments)    Patient was taking Amlodipine and Hydralazine at the same time, so the reactions came from one of the 2: Lethargy and an all-over feeling of NOT feeling well (also)  . Hydralazine Nausea And Vomiting and Other (See Comments)    Patient was taking Hydralazine AND Amlodipine at the same time, so the reactions came from one of the 2: Lethargy and an all-over feeling of NOT feeling well (also)    Family History  Problem Relation Age of Onset  . Cancer Mother   . Stroke Father   . Diabetes Father   . Hypertension Father      Prior to Admission medications   Medication Sig Start Date End Date Taking? Authorizing Provider  carvedilol (COREG) 6.25 MG tablet Take 1 tablet (6.25 mg total) by mouth 2 (two) times daily with a meal. 03/01/20 05/07/20 Yes Alma Friendly, MD  diphenhydramine-acetaminophen (TYLENOL PM) 25-500 MG TABS tablet Take 2 tablets by mouth at bedtime as needed (for sleep).   Yes [provider]  ferrous sulfate 325 (65 FE) MG tablet Take 1 tablet (325 mg total) by mouth daily with breakfast. 03/01/20  Yes Alma Friendly, MD  insulin detemir (LEVEMIR) 100 UNIT/ML injection Inject 0.3 mLs (30 Units total) into the skin daily. Patient  taking differently: Inject 20 Units into the skin in the morning.  03/01/20  Yes Alma Friendly, MD    Physical Exam: Vitals:   05/07/20 1750 05/07/20 2213  BP: (!) 169/99 (!) 168/95  Pulse: 100 100  Resp: 17 16  Temp: 100.3 F (37.9 C)   TempSrc: Oral   SpO2: 94% 97%    Constitutional: NAD, calm, comfortable Eyes: PERRL, lids and conjunctivae normal ENMT: Mucous membranes are moist. Posterior pharynx clear of any exudate or lesions.Normal dentition.  Neck: normal, supple, no masses, no thyromegaly Respiratory: clear to auscultation bilaterally, no wheezing, no crackles. Normal respiratory effort. No accessory muscle use.  Cardiovascular: Regular rate  and rhythm, no murmurs / rubs / gallops. 2+ BLE edema. 2+ pedal pulses. No carotid bruits.  Abdomen: no tenderness, no masses palpated. No hepatosplenomegaly. Bowel sounds positive.  Musculoskeletal: no clubbing / cyanosis. No joint deformity upper and lower extremities. Good ROM, no contractures. Normal muscle tone.  Skin: no rashes, lesions, ulcers. No induration Neurologic: CN 2-12 grossly intact. Sensation intact, DTR normal. Strength 5/5 in all 4.  Psychiatric: Normal judgment and insight. Alert and oriented x 3. Normal mood.    Labs on Admission: I have personally reviewed following labs and imaging studies  CBC: Recent Labs  Lab 05/07/20 1824  WBC 12.9*  HGB 6.9*  HCT 22.2*  MCV 84.1  PLT 549   Basic Metabolic Panel: Recent Labs  Lab 05/07/20 1824  NA 141  K 4.9  CL 105  CO2 22  GLUCOSE 118*  BUN 52*  CREATININE 6.50*  CALCIUM 8.3*   GFR: CrCl cannot be calculated (Unknown ideal weight.). Liver Function Tests: Recent Labs  Lab 05/07/20 1824  AST 14*  ALT 18  ALKPHOS 73  BILITOT 0.7  PROT 6.8  ALBUMIN 2.9*   Recent Labs  Lab 05/07/20 1824  LIPASE 19   No results for input(s): AMMONIA in the last 168 hours. Coagulation Profile: No results for input(s): INR, PROTIME in the last 168 hours. Cardiac Enzymes: Recent Labs  Lab 05/07/20 1824  CKTOTAL 967*   BNP (last 3 results) No results for input(s): PROBNP in the last 8760 hours. HbA1C: No results for input(s): HGBA1C in the last 72 hours. CBG: No results for input(s): GLUCAP in the last 168 hours. Lipid Profile: No results for input(s): CHOL, HDL, LDLCALC, TRIG, CHOLHDL, LDLDIRECT in the last 72 hours. Thyroid Function Tests: No results for input(s): TSH, T4TOTAL, FREET4, T3FREE, THYROIDAB in the last 72 hours. Anemia Panel: No results for input(s): VITAMINB12, FOLATE, FERRITIN, TIBC, IRON, RETICCTPCT in the last 72 hours. Urine analysis:    Component Value Date/Time   COLORURINE STRAW (A)  02/25/2020 1237   APPEARANCEUR CLEAR 02/25/2020 1237   LABSPEC 1.011 02/25/2020 1237   PHURINE 5.0 02/25/2020 1237   GLUCOSEU NEGATIVE 02/25/2020 1237   HGBUR MODERATE (A) 02/25/2020 1237   BILIRUBINUR NEGATIVE 02/25/2020 1237   KETONESUR NEGATIVE 02/25/2020 1237   PROTEINUR >=300 (A) 02/25/2020 1237   UROBILINOGEN 0.2 07/16/2014 1747   NITRITE NEGATIVE 02/25/2020 1237   LEUKOCYTESUR NEGATIVE 02/25/2020 1237    Radiological Exams on Admission: No results found.  EKG: Independently reviewed.  Assessment/Plan Principal Problem:   Acute renal failure superimposed on stage 4 chronic kidney disease (HCC) Active Problems:   Essential hypertension   DM2 (diabetes mellitus, type 2) (HCC)   Nausea and vomiting   Anemia of chronic kidney failure    1. AKF on CKD4 - 1. Worried that this may  just represent progression of his CKD to stage 5 2. Renal US pending to r/o obstruction (seems less likely) 3. IVF: 500cc bolus in ED + 1u PRBC transfusion overnight 4. UA pending 5. Needs nephrology consult in AM 2. N/V - 1. Zofran PRN 2. Biggest concern right now is that this may be a symptom of his advanced renal disease. 3. Could also be diabetic gastroparesis, but this would be a new diagnosis for patient. 4. Or could be another acute illness, enteritis, etc. 5. IVF: 500cc bolus in ED + 1u PRBC transfusion overnight. 3. Anemia of CKD - 1. 1u PRBC transfusion as above 2. Repeat CBC in AM 4. DM2 - 1. Lantus 15 2. Sensitive SSI AC 5. HTN - 1. Cont coreg  DVT prophylaxis: Heparin [place] Code Status: Full Family Communication: No family in room Disposition Plan: Home after renal function addressed Consults called: Call nephrology in AM Admission status: Admit to inpatient  Severity of Illness: The appropriate patient status for this patient is INPATIENT. Inpatient status is judged to be reasonable and necessary in order to provide the required intensity of service to ensure the patient's  safety. The patient's presenting symptoms, physical exam findings, and initial radiographic and laboratory data in the context of their chronic comorbidities is felt to place them at high risk for further clinical deterioration. Furthermore, it is not anticipated that the patient will be medically stable for discharge from the hospital within 2 midnights of admission. The following factors support the patient status of inpatient.   IP status due to renal failure with creat of 6.5.   * I certify that at the point of admission it is my clinical judgment that the patient will require inpatient hospital care spanning beyond 2 midnights from the point of admission due to high intensity of service, high risk for further deterioration and high frequency of surveillance required.*    Daryle Boyington M. DO [facility]  How to contact the [facility] Attending or Consulting provider [place] or covering provider during after hours [place], for this patient?  1. Check the care team in [facility] and look for a) attending/consulting [facility] provider listed and b) the [facility] team listed 2. Log into [email or web address]  Amion Physician Scheduling and messaging for groups and whole hospitals  On call and physician scheduling software for group practices, residents, hospitalists and other medical providers for call, clinic, rotation and shift schedules. On[employer] is a hospital-wide system for scheduling doctors and paging doctors on call. EasyPlot is for scientific plotting and data analysis.  [email or web address]  and use [place]'s universal password to access. If you do not have the password, please contact the hospital operator.  3. Locate the [facility] provider you are looking for under [facility] and page to a number that you can be directly reached. 4. If you still have difficulty reaching the provider, please page the [facility] (Director on Call) for the Hospitalists listed on amion for assistance.  05/07/2020, 10:53 PM

## 2020-05-07 NOTE — ED Notes (Signed)
Ultrasound at bedside

## 2020-05-07 NOTE — ED Provider Notes (Signed)
[facility] DEPT Provider Note   CSN: [id number] Arrival date & time: 05/07/20  1734     History Chief Complaint  Patient presents with  . Emesis  . Diarrhea    [name] is a 34 y.o. male.  34 year old male with history of chronic kidney disease who presents with emesis times several days.  Did have diarrhea a few days ago but none currently.  Patient's emesis has been nonbilious and nonbloody.  Denies any fever but has had some muscle aches.  Denies any cough or congestion.  States he cannot keep anything down at this time.  Denies any abdominal pain.  Denies any urinary symptoms.  No treatment use prior to arrival        Past Medical History:  Diagnosis Date  . Diabetes mellitus   . High cholesterol   . Hypertension     Patient Active Problem List   Diagnosis Date Noted  . Dehydration   . Hypertensive urgency 02/25/2020  . AKI (acute kidney injury) ([place]) 12/02/2019  . Anemia 12/02/2019  . Essential hypertension 12/02/2019  . SOB (shortness of breath) 12/02/2019  . Type 2 diabetes mellitus without complication ([place]) 36/14/4315    Past Surgical History:  Procedure Laterality Date  . FRACTURE SURGERY    . I & D EXTREMITY Left 07/16/2014   Procedure: IRRIGATION AND DEBRIDEMENT EXTREMITY/LEFT INDEX FINGER;  Surgeon: Leanora Cover, MD;  Location: [facility];  Service: Orthopedics;  Laterality: Left;       Family History  Problem Relation Age of Onset  . Cancer Mother   . Stroke Father   . Diabetes Father   . Hypertension Father     Social History   Tobacco Use  . Smoking status: Never Smoker  . Smokeless tobacco: Never Used  Vaping Use  . Vaping Use: Never used  Substance Use Topics  . Alcohol use: Yes  . Drug use: No    Home Medications Prior to Admission medications   Medication Sig Start Date End Date Taking? Authorizing Provider  acetaminophen (TYLENOL) 500 MG tablet Take 500-1,000 mg by mouth every 6 (six) hours  as needed for mild pain or headache.     [provider]  carvedilol (COREG) 6.25 MG tablet Take 1 tablet (6.25 mg total) by mouth 2 (two) times daily with a meal. 03/01/20 03/31/20  Alma Friendly, MD  diphenhydramine-acetaminophen (TYLENOL PM) 25-500 MG TABS tablet Take 2 tablets by mouth at bedtime as needed (for sleep).    [provider]  ferrous sulfate 325 (65 FE) MG tablet Take 1 tablet (325 mg total) by mouth daily with breakfast. 03/01/20   Alma Friendly, MD  insulin detemir (LEVEMIR) 100 UNIT/ML injection Inject 0.3 mLs (30 Units total) into the skin daily. Patient taking differently: Inject 30 Units into the skin in the morning.  03/01/20   Alma Friendly, MD    Allergies    Amlodipine and Hydralazine  Review of Systems   Review of Systems  All other systems reviewed and are negative.   Physical Exam Updated Vital Signs BP (!) 169/99 (BP Location: Right Arm)   Pulse 100   Temp 100.3 F (37.9 C) (Oral)   Resp 17   SpO2 94%   Physical Exam Vitals and nursing note reviewed.  Constitutional:      General: He is not in acute distress.    Appearance: Normal appearance. He is well-developed. He is not toxic-appearing.  HENT:  Head: Normocephalic and atraumatic.  Eyes:     General: Lids are normal.     Conjunctiva/sclera: Conjunctivae normal.     Pupils: Pupils are equal, round, and reactive to light.  Neck:     Thyroid: No thyroid mass.     Trachea: No tracheal deviation.  Cardiovascular:     Rate and Rhythm: Normal rate and regular rhythm.     Heart sounds: Normal heart sounds. No murmur heard.  No gallop.   Pulmonary:     Effort: Pulmonary effort is normal. No respiratory distress.     Breath sounds: Normal breath sounds. No stridor. No decreased breath sounds, wheezing, rhonchi or rales.  Abdominal:     General: Bowel sounds are normal. There is no distension.     Palpations: Abdomen is soft.     Tenderness: There is no  abdominal tenderness. There is no rebound.  Musculoskeletal:        General: No tenderness. Normal range of motion.     Cervical back: Normal range of motion and neck supple.  Skin:    General: Skin is warm and dry.     Findings: No abrasion or rash.  Neurological:     Mental Status: He is alert and oriented to person, place, and time.     GCS: GCS eye subscore is 4. GCS verbal subscore is 5. GCS motor subscore is 6.     Cranial Nerves: No cranial nerve deficit.     Sensory: No sensory deficit.  Psychiatric:        Speech: Speech normal.        Behavior: Behavior normal.     ED Results / Procedures / Treatments   Labs (all labs ordered are listed, but only abnormal results are displayed) Labs Reviewed  COMPREHENSIVE METABOLIC PANEL - Abnormal; Notable for the following components:      Result Value   Glucose, Bld 118 (*)    BUN 52 (*)    Creatinine, Ser 6.50 (*)    Calcium 8.3 (*)    Albumin 2.9 (*)    AST 14 (*)    GFR, Estimated 11 (*)    All other components within normal limits  CBC - Abnormal; Notable for the following components:   WBC 12.9 (*)    RBC 2.64 (*)    Hemoglobin 6.9 (*)    HCT 22.2 (*)    All other components within normal limits  RESPIRATORY PANEL BY RT PCR (FLU A&B, COVID)  LIPASE, BLOOD  URINALYSIS, ROUTINE W REFLEX MICROSCOPIC  TYPE AND SCREEN    EKG None  Radiology No results found.  Procedures Procedures (including critical care time)  Medications Ordered in ED Medications - No data to display  ED Course  I have reviewed the triage vital signs and the nursing notes.  Pertinent labs & imaging results that were available during my care of the patient were reviewed by me and considered in my medical decision making (see chart for details).    MDM Rules/Calculators/A&P                          Patient's Covid test is negative here.  His hemoglobin is low at 6.9.  Suspect this is chronic.  He is guaiac negative.  Renal function is  also worse to.  He has been having emesis times several days suspect he is dehydrated.  Will consult hospitalist for admission Final Clinical Impression(s) / ED Diagnoses Final  diagnoses:  None    Rx / DC Orders ED Discharge Orders    None       Lacretia Leigh, MD 05/07/20 2214

## 2020-05-07 NOTE — ED Triage Notes (Signed)
Pt c/o diarrhea and vomiting x1 week, states he is unable to tolerate any PO's. Denies any other sx. Denies any sick contacts. Denies any new food/ medications.

## 2020-05-08 LAB — URINALYSIS, ROUTINE W REFLEX MICROSCOPIC
Bilirubin Urine: NEGATIVE
Glucose, UA: NEGATIVE mg/dL
Hgb urine dipstick: NEGATIVE
Ketones, ur: NEGATIVE mg/dL
Leukocytes,Ua: NEGATIVE
Nitrite: NEGATIVE
Protein, ur: >=300 mg/dL — AB
Specific Gravity, Urine: 1.012 (ref 1.005–1.030)
pH: 5 (ref 5.0–8.0)

## 2020-05-08 LAB — FERRITIN: Ferritin: 358 ng/mL — ABNORMAL HIGH (ref 24–336)

## 2020-05-08 LAB — BASIC METABOLIC PANEL
Anion gap: 8 (ref 5–15)
BUN: 51 mg/dL — ABNORMAL HIGH (ref 6–20)
CO2: 22 mmol/L (ref 22–32)
Calcium: 7.5 mg/dL — ABNORMAL LOW (ref 8.9–10.3)
Chloride: 110 mmol/L (ref 98–111)
Creatinine, Ser: 6.5 mg/dL — ABNORMAL HIGH (ref 0.61–1.24)
GFR, Estimated: 11 mL/min — ABNORMAL LOW (ref >60)
Glucose, Bld: 127 mg/dL — ABNORMAL HIGH (ref 70–99)
Potassium: 4.7 mmol/L (ref 3.5–5.1)
Sodium: 140 mmol/L (ref 135–145)

## 2020-05-08 LAB — HEMOGLOBIN A1C
Hgb A1c MFr Bld: 6.1 % — ABNORMAL HIGH (ref 4.8–5.6)
Mean Plasma Glucose: 128 mg/dL

## 2020-05-08 LAB — CBC
HCT: 22.8 % — ABNORMAL LOW (ref 39.0–52.0)
Hemoglobin: 7.2 g/dL — ABNORMAL LOW (ref 13.0–17.0)
MCH: 26.9 pg (ref 26.0–34.0)
MCHC: 31.6 g/dL (ref 30.0–36.0)
MCV: 85.1 fL (ref 80.0–100.0)
Platelets: 275 10*3/uL (ref 150–400)
RBC: 2.68 MIL/uL — ABNORMAL LOW (ref 4.22–5.81)
RDW: 14.6 % (ref 11.5–15.5)
WBC: 11.3 10*3/uL — ABNORMAL HIGH (ref 4.0–10.5)
nRBC: 0 % (ref 0.0–0.2)

## 2020-05-08 LAB — CBG MONITORING, ED
Glucose-Capillary: 125 mg/dL — ABNORMAL HIGH (ref 70–99)
Glucose-Capillary: 119 mg/dL — ABNORMAL HIGH (ref 70–99)
Glucose-Capillary: 106 mg/dL — ABNORMAL HIGH (ref 70–99)
Glucose-Capillary: 85 mg/dL (ref 70–99)

## 2020-05-08 LAB — SODIUM, URINE, RANDOM: Sodium, Ur: 31 mmol/L

## 2020-05-08 LAB — IRON AND TIBC
Iron: 12 ug/dL — ABNORMAL LOW (ref 45–182)
Saturation Ratios: 6 % — ABNORMAL LOW (ref 17.9–39.5)
TIBC: 192 ug/dL — ABNORMAL LOW (ref 250–450)
UIBC: 180 ug/dL

## 2020-05-08 LAB — HEPATITIS B SURFACE ANTIGEN: Hepatitis B Surface Ag: NONREACTIVE

## 2020-05-08 LAB — PHOSPHORUS: Phosphorus: 5.5 mg/dL — ABNORMAL HIGH (ref 2.5–4.6)

## 2020-05-08 LAB — MAGNESIUM: Magnesium: 1.9 mg/dL (ref 1.7–2.4)

## 2020-05-08 LAB — GLUCOSE, CAPILLARY: Glucose-Capillary: 98 mg/dL (ref 70–99)

## 2020-05-08 LAB — FOLATE: Folate: 8.9 ng/mL (ref >5.9)

## 2020-05-08 LAB — VITAMIN B12: Vitamin B-12: 564 pg/mL (ref 180–914)

## 2020-05-08 LAB — CREATININE, URINE, RANDOM: Creatinine, Urine: 181.81 mg/dL

## 2020-05-08 LAB — HEPATITIS C ANTIBODY: HCV Ab: NONREACTIVE

## 2020-05-08 LAB — HEPATITIS B CORE ANTIBODY, TOTAL: Hep B Core Total Ab: NONREACTIVE

## 2020-05-08 MED ORDER — SODIUM CHLORIDE 0.9 % IV SOLN
510.0000 mg | Freq: Once | INTRAVENOUS | Status: AC
  Administered 2020-05-08: 510 mg via INTRAVENOUS
  Filled 2020-05-08: qty 510

## 2020-05-08 MED ORDER — MUPIROCIN 2 % EX OINT
TOPICAL_OINTMENT | CUTANEOUS | Status: AC
  Filled 2020-05-08: qty 22

## 2020-05-08 MED ORDER — MUPIROCIN CALCIUM 2 % EX CREA
TOPICAL_CREAM | Freq: Every day | CUTANEOUS | Status: DC
  Filled 2020-05-08: qty 15

## 2020-05-08 NOTE — Consult Note (Signed)
Reason for Consult: Renal failure Referring Physician: Dr. Sloan Leiter  Chief Complaint: Nausea, vomiting  Assessment/Plan: 1. 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 UPC 2.7 with neg ANA, ANCA, DSDNA, neg SPEP with no ACEI bec of advanced renal disease.  - No e/o obstruction on ultrasound. - He's had laser therapy for retinopathy and known DM for at least 10 years.  - He may have progressed to ESRD; unfortunately that we could not biopsy him because of the horseshoe kidney and poor window. - Reasonable to hydrate with isotonic fluids given history of not being able to keep fluids or solids down but if no improvement in renal function then likely has progression of renal disease. 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 - Will also send urine lytes. - If no improvement in renal function by tomorrow then will need to be transfered to [facility] to initiate RRT and CLIP. I am concerned that the nausea and vomiting is from uremia; his appetite has also not been good. 2. Anemia - will need to be loaded with IV iron as well; transfuse as needed. 3. Renal osteodystrophy - will check a phos and PTH. 4. HTN 5. DM   HPI: [name] is an 34 y.o. male IDDM HTN CKD4 with a baseline creatinine of ~4 presenting with nausea and vomiting for a few days. He's had diarrhea as well but that has since resolved. He's also had prior episodes of nausea and vomiting + AKI on CKD. Of note he has bilateral leg swelling and because he was not able to maintain any PO intake he came to the ED. He has been getting fluid boluses and Zofran + creatinine was up to 6.5 He denies cough, fever, chills, myalgias, abdominal pain or dysuria. He is anemic with a negative guaiac in the ED. He is followed by Dr. 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   ROS Pertinent  items are noted in HPI.  Chemistry and CBC: Creatinine, Ser  Date/Time Value Ref Range Status  05/08/2020 07:59 AM 6.50 (H) 0.61 - 1.24 mg/dL Final  05/07/2020 06:24 PM 6.50 (H) 0.61 - 1.24 mg/dL Final  03/10/2020 02:10 PM 4.44 (H) 0.61 - 1.24 mg/dL Final  03/01/2020 05:45 AM 4.52 (H) 0.61 - 1.24 mg/dL Final  02/29/2020 04:49 AM 4.30 (H) 0.61 - 1.24 mg/dL Final  02/28/2020 05:17 AM 4.57 (H) 0.61 - 1.24 mg/dL Final  02/27/2020 07:25 AM 4.42 (H) 0.61 - 1.24 mg/dL Final  02/26/2020 05:24 AM 4.28 (H) 0.61 - 1.24 mg/dL Final  02/25/2020 11:27 AM 4.26 (H) 0.61 - 1.24 mg/dL Final  12/05/2019 03:52 AM 2.73 (H) 0.61 - 1.24 mg/dL Final  12/04/2019 04:28 AM 2.72 (H) 0.61 - 1.24 mg/dL Final  12/03/2019 04:31 PM 2.53 (H) 0.61 - 1.24 mg/dL Final  12/02/2019 07:37 AM 2.58 (H) 0.61 - 1.24 mg/dL Final  07/16/2014 03:28 PM 0.62 0.50 - 1.35 mg/dL Final  10/02/2013 04:07 PM 0.59 0.50 - 1.35 mg/dL Final  10/25/2011 02:45 PM 0.64 0.50 - 1.35 mg/dL Final  02/17/2009 06:35 AM 0.79 0.40 - 1.50 mg/dL Final  02/17/2009 05:40 AM 0.7 0.40 - 1.50 mg/dL Final  10/12/2007 06:15 PM 0.82  Final  10/12/2007 12:02 PM 0.68  Final  10/12/2007 05:30 AM 0.84  Final  10/11/2007 04:46 PM 0.99  Final   Recent  Labs  Lab 05/07/20 1824 05/08/20 0759  NA 141 140  K 4.9 4.7  CL 105 110  CO2 22 22  GLUCOSE 118* 127*  BUN 52* 51*  CREATININE 6.50* 6.50*  CALCIUM 8.3* 7.5*  PHOS  --  5.5*   Recent Labs  Lab 05/07/20 1824 05/08/20 0639  WBC 12.9* 11.3*  HGB 6.9* 7.2*  HCT 22.2* 22.8*  MCV 84.1 85.1  PLT 307 275   Liver Function Tests: Recent Labs  Lab 05/07/20 1824  AST 14*  ALT 18  ALKPHOS 73  BILITOT 0.7  PROT 6.8  ALBUMIN 2.9*   Recent Labs  Lab 05/07/20 1824  LIPASE 19   No results for input(s): AMMONIA in the last 168 hours. Cardiac Enzymes: Recent Labs  Lab 05/07/20 1824  CKTOTAL 967*   Iron Studies:  Recent Labs    05/07/20 2244  IRON 12*  TIBC 192*  FERRITIN 358*    PT/INR: @LABRCNTIP (inr:5)  Xrays/Other Studies: ) Results for orders placed or performed during the hospital encounter of 05/07/20 (from the past 48 hour(s))  Lipase, blood     Status: None   Collection Time: 05/07/20  6:24 PM  Result Value Ref Range   Lipase 19 11 - 51 U/L    Comment: Performed at [facility], [place] [address], [place], [place] [address]  Comprehensive metabolic panel     Status: Abnormal   Collection Time: 05/07/20  6:24 PM  Result Value Ref Range   Sodium 141 135 - 145 mmol/L   Potassium 4.9 3.5 - 5.1 mmol/L   Chloride 105 98 - 111 mmol/L   CO2 22 22 - 32 mmol/L   Glucose, Bld 118 (H) 70 - 99 mg/dL    Comment: Glucose reference range applies only to samples taken after fasting for at least 8 hours.   BUN 52 (H) 6 - 20 mg/dL   Creatinine, Ser 6.50 (H) 0.61 - 1.24 mg/dL   Calcium 8.3 (L) 8.9 - 10.3 mg/dL   Total Protein 6.8 6.5 - 8.1 g/dL   Albumin 2.9 (L) 3.5 - 5.0 g/dL   AST 14 (L) 15 - 41 U/L   ALT 18 0 - 44 U/L   Alkaline Phosphatase 73 38 - 126 U/L   Total Bilirubin 0.7 0.3 - 1.2 mg/dL   GFR, Estimated 11 (L) >60 mL/min    Comment: (NOTE) Calculated using the CKD-EPI Creatinine Equation (2021)    Anion gap 14 5 - 15    Comment: Performed at [facility], [place] [address], [place], [place] [address]  CBC     Status: Abnormal   Collection Time: 05/07/20  6:24 PM  Result Value Ref Range   WBC 12.9 (H) 4.0 - 10.5 K/uL   RBC 2.64 (L) 4.22 - 5.81 MIL/uL   Hemoglobin 6.9 (LL) 13.0 - 17.0 g/dL    Comment: REPEATED TO VERIFY THIS CRITICAL RESULT HAS VERIFIED AND BEEN CALLED TO P.DOWD BY NATHAN THOMPSON ON 10 27 2021 AT 1848, AND HAS BEEN READ BACK. CRITICAL RESULT VERIFIED    HCT 22.2 (L) 39 - 52 %   MCV 84.1 80.0 - 100.0 fL   MCH 26.1 26.0 - 34.0 pg   MCHC 31.1 30.0 - 36.0 g/dL   RDW 14.6 11.5 - 15.5 %   Platelets 307 150 - 400 K/uL   nRBC 0.0 0.0 - 0.2 %    Comment: Performed at [facility],  [address] [place], [place]  [address]  CK     Status: Abnormal   Collection Time: 05/07/20  6:24 PM  Result Value Ref Range   Total CK 967 (H) 49.0 - 397.0 U/L    Comment: Performed at [facility], [place] [address] [place], [place] [address]  Type and screen     Status: None (Preliminary result)   Collection Time: 05/07/20  8:12 PM  Result Value Ref Range   ABO/RH(D) O POS    Antibody Screen NEG    Sample Expiration 05/10/2020,2359    Unit Number [id number]    Blood Component Type RED CELLS,LR    Unit division 00    Status of Unit ISSUED    Transfusion Status OK TO TRANSFUSE    Crossmatch Result      Compatible Performed at [place] [address] [place], [place] [address]   Prepare RBC (crossmatch)     Status: None   Collection Time: 05/07/20  8:12 PM  Result Value Ref Range   Order Confirmation      ORDER PROCESSED BY BLOOD BANK Performed at [place] [address] [place],  [address]   Respiratory Panel by RT PCR (Flu A&B, Covid) - Nasopharyngeal Swab     Status: None   Collection Time: 05/07/20  8:24 PM   Specimen: Nasopharyngeal Swab  Result Value Ref Range   SARS Coronavirus 2 by RT PCR NEGATIVE NEGATIVE    Comment: (NOTE) SARS-CoV-2 target nucleic acids are NOT DETECTED.  The SARS-CoV-2 RNA is generally detectable in upper respiratoy specimens during the acute phase of infection. The lowest concentration of SARS-CoV-2 viral copies this assay can detect is 131 copies/mL. A negative result does not preclude SARS-Cov-2 infection and should not be used as the sole basis for treatment or other patient management decisions. A negative result may occur with  improper specimen collection/handling, submission of specimen other than nasopharyngeal swab, presence of viral mutation(s) within the areas targeted by this assay, and inadequate number of viral copies (<131 copies/mL). A negative  result must be combined with clinical observations, patient history, and epidemiological information. The expected result is Negative.  Fact Sheet for Patients:  PinkCheek.be  Fact Sheet for Healthcare Providers:  GravelBags.it  This test is no t yet approved or cleared by the [place] FDA and  has been authorized for detection and/or diagnosis of SARS-CoV-2 by FDA under an Emergency Use Authorization (EUA). This EUA will remain  in effect (meaning this test can be used) for the duration of the COVID-19 declaration under Section 564(b)(1) of the Act, 21 U.S.C. section 360bbb-3(b)(1), unless the authorization is terminated or revoked sooner.     Influenza A by PCR NEGATIVE NEGATIVE   Influenza B by PCR NEGATIVE NEGATIVE    Comment: (NOTE) The Xpert Xpress SARS-CoV-2/FLU/RSV assay is intended as an aid in  the diagnosis of influenza from Nasopharyngeal swab specimens and  should not be used as a sole basis for treatment. Nasal washings and  aspirates are unacceptable for Xpert Xpress SARS-CoV-2/FLU/RSV  testing.  Fact Sheet for Patients: PinkCheek.be  Fact Sheet for Healthcare Providers: GravelBags.it  This test is not yet approved or cleared by the [place] FDA and  has been authorized for detection and/or diagnosis of SARS-CoV-2 by  FDA under an Emergency Use Authorization (EUA). This EUA will remain  in effect (meaning this test can be used) for the duration of the  Covid-19 declaration under Section 564(b)(1) of the Act,  21  U.S.C. section 360bbb-3(b)(1), unless the authorization is  terminated or revoked. Performed at [facility], [place] [address] [place], [place] [address]   Vitamin B12     Status: None   Collection Time: 05/07/20 10:44 PM  Result Value Ref Range   Vitamin B-12 564 180 - 914 pg/mL    Comment: (NOTE) This  assay is not validated for testing neonatal or myeloproliferative syndrome specimens for Vitamin B12 levels. Performed at [facility], [place] [address], [place], [place] [address]   Folate     Status: None   Collection Time: 05/07/20 10:44 PM  Result Value Ref Range   Folate 8.9 >5.9 ng/mL    Comment: Performed at [facility], [place] [address] [place], [place] [address]  Iron and TIBC     Status: Abnormal   Collection Time: 05/07/20 10:44 PM  Result Value Ref Range   Iron 12 (L) 45 - 182 ug/dL   TIBC 192 (L) 250 - 450 ug/dL   Saturation Ratios 6 (L) 17.9 - 39.5 %   UIBC 180 ug/dL    Comment: Performed at [facility], [place] [address], [place], [place] [address]  Ferritin     Status: Abnormal   Collection Time: 05/07/20 10:44 PM  Result Value Ref Range   Ferritin 358 (H) 24 - 336 ng/mL    Comment: Performed at [facility], [place] [address], [place], [place] [address]  Reticulocytes     Status: Abnormal   Collection Time: 05/07/20 10:44 PM  Result Value Ref Range   Retic Ct Pct 1.0 0.4 - 3.1 %   RBC. 2.37 (L) 4.22 - 5.81 MIL/uL   Retic Count, Absolute 24.4 19.0 - 186.0 K/uL   Immature Retic Fract 20.9 (H) 2.3 - 15.9 %    Comment: Performed at [facility], [place] [address] [place], [place] [address]  Urinalysis, Routine w reflex microscopic Urine, Random     Status: Abnormal   Collection Time: 05/08/20  6:39 AM  Result Value Ref Range   Color, Urine YELLOW YELLOW   APPearance HAZY (A) CLEAR   Specific Gravity, Urine 1.012 1.005 - 1.030   pH 5.0 5.0 - 8.0   Glucose, UA NEGATIVE NEGATIVE mg/dL   Hgb urine dipstick NEGATIVE NEGATIVE   Bilirubin Urine NEGATIVE NEGATIVE   Ketones, ur NEGATIVE NEGATIVE mg/dL   Protein, ur >=300 (A) NEGATIVE mg/dL   Nitrite NEGATIVE NEGATIVE   Leukocytes,Ua NEGATIVE NEGATIVE   RBC / HPF 6-10 0 - 5 RBC/hpf   WBC, UA 0-5 0 - 5 WBC/hpf   Bacteria, UA RARE (A)  NONE SEEN   Squamous Epithelial / LPF 0-5 0 - 5   Mucus PRESENT     Comment: Performed at [facility], [place] [address] [place], [place] [address]  CBC     Status: Abnormal   Collection Time: 05/08/20  6:39 AM  Result Value Ref Range   WBC 11.3 (H) 4.0 - 10.5 K/uL   RBC 2.68 (L) 4.22 - 5.81 MIL/uL   Hemoglobin 7.2 (L) 13.0 - 17.0 g/dL   HCT 22.8 (L) 39 - 52 %   MCV 85.1 80.0 - 100.0 fL   MCH 26.9 26.0 - 34.0 pg   MCHC 31.6 30.0 - 36.0 g/dL   RDW 14.6 11.5 - 15.5 %   Platelets 275 150 - 400 K/uL   nRBC 0.0 0.0 - 0.2 %    Comment:  Performed at [facility], [place] [address] [place], [place] [address]  CBG monitoring, ED     Status: Abnormal   Collection Time: 05/08/20  7:35 AM  Result Value Ref Range   Glucose-Capillary 125 (H) 70 - 99 mg/dL    Comment: Glucose reference range applies only to samples taken after fasting for at least 8 hours.  Basic metabolic panel     Status: Abnormal   Collection Time: 05/08/20  7:59 AM  Result Value Ref Range   Sodium 140 135 - 145 mmol/L   Potassium 4.7 3.5 - 5.1 mmol/L   Chloride 110 98 - 111 mmol/L   CO2 22 22 - 32 mmol/L   Glucose, Bld 127 (H) 70 - 99 mg/dL    Comment: Glucose reference range applies only to samples taken after fasting for at least 8 hours.   BUN 51 (H) 6 - 20 mg/dL   Creatinine, Ser 6.50 (H) 0.61 - 1.24 mg/dL   Calcium 7.5 (L) 8.9 - 10.3 mg/dL   GFR, Estimated 11 (L) >60 mL/min    Comment: (NOTE) Calculated using the CKD-EPI Creatinine Equation (2021)    Anion gap 8 5 - 15    Comment: Performed at [facility], [place] [address] [place], [place] [address]  Magnesium     Status: None   Collection Time: 05/08/20  7:59 AM  Result Value Ref Range   Magnesium 1.9 1.7 - 2.4 mg/dL    Comment: Performed at [facility], [place] [address], [place], [place] [address]  Phosphorus     Status: Abnormal   Collection Time: 05/08/20  7:59 AM  Result Value Ref  Range   Phosphorus 5.5 (H) 2.5 - 4.6 mg/dL    Comment: Performed at [facility], [place] [address], [place], [place] [address]  CBG monitoring, ED     Status: Abnormal   Collection Time: 05/08/20 11:33 AM  Result Value Ref Range   Glucose-Capillary 119 (H) 70 - 99 mg/dL    Comment: Glucose reference range applies only to samples taken after fasting for at least 8 hours.   US RENAL  Result Date: 05/07/2020 CLINICAL DATA:  Acute renal injury. EXAM: RENAL / URINARY TRACT ULTRASOUND COMPLETE COMPARISON:  Renal ultrasound 02/26/2020, CT from renal biopsy 02/29/2020 FINDINGS: Right Kidney: Renal measurements: 12.1 x 6.5 x 6.2 cm = volume: 256 mL. 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 Mild increased renal echogenicity. No mass or hydronephrosis visualized. Left Kidney: Renal measurements: 16.3 x 7.6 x 6.7 cm = volume: 436 mL. 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 Borderline increased parenchymal echogenicity. No mass or hydronephrosis visualized. Bladder: Layering debris in the bladder.  No bladder wall thickening. Other: None. IMPRESSION: 1. No obstructive uropathy. 2. 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 3. Mildly increased bilateral renal echogenicity suggesting chronic medical renal disease. 4. Layering debris in the bladder. Electronically Signed   By: Keith Rake M.D.   On: 05/07/2020 23:28    PMH:   Past Medical History:  Diagnosis Date   Diabetes mellitus    High cholesterol    Hypertension     PSH:   Past Surgical History:  Procedure Laterality Date   FRACTURE SURGERY     I & D EXTREMITY Left 07/16/2014   Procedure: IRRIGATION AND DEBRIDEMENT EXTREMITY/LEFT INDEX FINGER;  Surgeon: Leanora Cover, MD;  Location: [facility];  Service:  Orthopedics;  Laterality: Left;    Allergies:  Allergies  Allergen Reactions   Amlodipine Nausea And Vomiting and  Other (See Comments)    Patient was taking Amlodipine and Hydralazine at the same time, so the reactions came from one of the 2: Lethargy and an all-over feeling of NOT feeling well (also)   Hydralazine Nausea And Vomiting and Other (See Comments)    Patient was taking Hydralazine AND Amlodipine at the same time, so the reactions came from one of the 2: Lethargy and an all-over feeling of NOT feeling well (also)    Medications:   Prior to Admission medications   Medication Sig Start Date End Date Taking? Authorizing Provider  carvedilol (COREG) 6.25 MG tablet Take 1 tablet (6.25 mg total) by mouth 2 (two) times daily with a meal. 03/01/20 05/07/20 Yes Alma Friendly, MD  diphenhydramine-acetaminophen (TYLENOL PM) 25-500 MG TABS tablet Take 2 tablets by mouth at bedtime as needed (for sleep).   Yes [provider]  ferrous sulfate 325 (65 FE) MG tablet Take 1 tablet (325 mg total) by mouth daily with breakfast. 03/01/20  Yes Alma Friendly, MD  insulin detemir (LEVEMIR) 100 UNIT/ML injection Inject 0.3 mLs (30 Units total) into the skin daily. Patient taking differently: Inject 20 Units into the skin in the morning.  03/01/20  Yes Alma Friendly, MD    Discontinued Meds:   Medications Discontinued During This Encounter  Medication Reason   acetaminophen (TYLENOL) 500 MG tablet Patient Preference   labetalol (NORMODYNE) injection 10 mg     Social History:  reports that he has never smoked. He has never used smokeless tobacco. He reports current alcohol use. He reports that he does not use drugs.  Family History:   Family History  Problem Relation Age of Onset   Cancer Mother    Stroke Father    Diabetes Father    Hypertension Father     Blood pressure (!) 164/107, pulse 91, temperature 98.9 F (37.2 C), temperature source Oral, resp. rate (!) 24, SpO2 99 %. General appearance: alert, cooperative and appears stated age Head: Normocephalic, without  obvious abnormality, atraumatic Eyes: negative Neck: no adenopathy, no carotid bruit, supple, symmetrical, trachea midline and thyroid not enlarged, symmetric, no tenderness/mass/nodules Back: symmetric, no curvature. ROM normal. No CVA tenderness. Resp: clear to auscultation bilaterally Cardio: regular rate and rhythm GI: soft, non-tender; bowel sounds normal; no masses,  no organomegaly Extremities: edema 2+ Pulses: 2+ and symmetric Skin: Skin color, texture, turgor normal. No rashes or lesions       Ancil Dewan, Hunt Oris, MD 05/08/2020, 12:20 PM

## 2020-05-08 NOTE — Progress Notes (Signed)
PROGRESS NOTE    [name]  EPP:[id number] DOB: 09/21/1985 DOA: 05/07/2020 PCP: Pcp, No    Brief Narrative:  34 year old gentleman with history of type 2 diabetes on insulin, hypertension and chronic kidney disease stage IV.  Recent multiple admissions.  Baseline creatinine about 4.  Admitted to the hospital with nausea and vomiting for past several days.  Previous episodes with nausea vomiting and similar problems with AKI on CKD.  Also has bilateral leg swelling.  He was not able to keep anything down so came to the ER. In the emergency room he was given some fluid boluses and Zofran.  Hemoglobin was 6.9.  Creatinine was 6.5.   Assessment & Plan:   Principal Problem:   Acute renal failure superimposed on stage 4 chronic kidney disease (HCC) Active Problems:   Essential hypertension   DM2 (diabetes mellitus, type 2) (HCC)   Nausea and vomiting   Anemia of chronic kidney failure  Acute renal failure superimposed on stage IV chronic kidney disease: Progressive chronic kidney disease, probably aggravated by nausea vomiting and poor oral intake. Potassium is normal.  No uremic features now. Intake and output monitoring. 500 cc bolus given in ER, 1 unit PRBC transfusion given. Continue to monitor due to significant symptoms. Does not look like patient will need emergent dialysis, called and discussed with nephrology consult. Renal ultrasound without any hydronephrosis.  Nausea vomiting: Probably gastroparesis from diabetes.  Symptomatic treatment.  Currently stabilizing.  Anemia of chronic kidney disease: Hemoglobin less than 7.  Given 1 unit of PRBC.  At about baseline.  Type 2 diabetes, poorly controlled: On Lantus and sliding scale.  Continue.  Hypertension: On Coreg.  Continue.   DVT prophylaxis: heparin injection 5,000 Units Start: 05/08/20 0600   Code Status: Full code Family Communication: None Disposition Plan: Status is: Inpatient  Remains inpatient  appropriate because:Inpatient level of care appropriate due to severity of illness   Dispo: The patient is from: Home              Anticipated d/c is to: Home              Anticipated d/c date is: 3 days              Patient currently is not medically stable to d/c.         Consultants:   Nephrology  Procedures:   None  Antimicrobials:   None   Subjective: Patient seen and examined.  He was still in the emergency room waiting for inpatient bed assignment.  I called and discussed case with nephrology about his presentation. Patient still has some nausea but he was able to drink and eat some food today.   Objective: Vitals:   05/08/20 0730 05/08/20 0800 05/08/20 0830 05/08/20 0900  BP: (!) 165/103 (!) 169/112 (!) 172/111 (!) 164/107  Pulse: 87 94 82 91  Resp: 14 (!) 22 20 (!) 24  Temp:      TempSrc:      SpO2: 100% 99% 99% 99%    Intake/Output Summary (Last 24 hours) at 05/08/2020 1119 Last data filed at 05/08/2020 0640 Gross per 24 hour  Intake 630 ml  Output 400 ml  Net 230 ml   There were no vitals filed for this visit.  Examination:  General exam: Appears calm and comfortable, on supplemental oxygen 1 to 2 L with 100% saturation. Chronically sick looking.  Not in any distress. Respiratory system: Clear to auscultation. Respiratory effort normal.  No  added sounds. Cardiovascular system: S1 & S2 heard, RRR. No JVD, murmurs, rubs, gallops or clicks.  2+ bilateral pedal edema.. Gastrointestinal system: Abdomen is nondistended, soft and nontender. No organomegaly or masses felt. Normal bowel sounds heard. Central nervous system: Alert and oriented. No focal neurological deficits. Extremities: Symmetric 5 x 5 power. Skin: No rashes, lesions or ulcers Psychiatry: Judgement and insight appear normal. Mood & affect appropriate.     Data Reviewed: I have personally reviewed following labs and imaging studies  CBC: Recent Labs  Lab 05/07/20 1824  05/08/20 0639  WBC 12.9* 11.3*  HGB 6.9* 7.2*  HCT 22.2* 22.8*  MCV 84.1 85.1  PLT 307 027   Basic Metabolic Panel: Recent Labs  Lab 05/07/20 1824 05/08/20 0759  NA 141 140  K 4.9 4.7  CL 105 110  CO2 22 22  GLUCOSE 118* 127*  BUN 52* 51*  CREATININE 6.50* 6.50*  CALCIUM 8.3* 7.5*  MG  --  1.9  PHOS  --  5.5*   GFR: CrCl cannot be calculated (Unknown ideal weight.). Liver Function Tests: Recent Labs  Lab 05/07/20 1824  AST 14*  ALT 18  ALKPHOS 73  BILITOT 0.7  PROT 6.8  ALBUMIN 2.9*   Recent Labs  Lab 05/07/20 1824  LIPASE 19   No results for input(s): AMMONIA in the last 168 hours. Coagulation Profile: No results for input(s): INR, PROTIME in the last 168 hours. Cardiac Enzymes: Recent Labs  Lab 05/07/20 1824  CKTOTAL 967*   BNP (last 3 results) No results for input(s): PROBNP in the last 8760 hours. HbA1C: No results for input(s): HGBA1C in the last 72 hours. CBG: Recent Labs  Lab 05/08/20 0735  GLUCAP 125*   Lipid Profile: No results for input(s): CHOL, HDL, LDLCALC, TRIG, CHOLHDL, LDLDIRECT in the last 72 hours. Thyroid Function Tests: No results for input(s): TSH, T4TOTAL, FREET4, T3FREE, THYROIDAB in the last 72 hours. Anemia Panel: Recent Labs    05/07/20 2244  VITAMINB12 564  FOLATE 8.9  FERRITIN 358*  TIBC 192*  IRON 12*  RETICCTPCT 1.0   Sepsis Labs: No results for input(s): PROCALCITON, LATICACIDVEN in the last 168 hours.  Recent Results (from the past 240 hour(s))  Respiratory Panel by RT PCR (Flu A&B, Covid) - Nasopharyngeal Swab     Status: None   Collection Time: 05/07/20  8:24 PM   Specimen: Nasopharyngeal Swab  Result Value Ref Range Status   SARS Coronavirus 2 by RT PCR NEGATIVE NEGATIVE Final    Comment: (NOTE) SARS-CoV-2 target nucleic acids are NOT DETECTED.  The SARS-CoV-2 RNA is generally detectable in upper respiratoy specimens during the acute phase of infection. The lowest concentration of SARS-CoV-2  viral copies this assay can detect is 131 copies/mL. A negative result does not preclude SARS-Cov-2 infection and should not be used as the sole basis for treatment or other patient management decisions. A negative result may occur with  improper specimen collection/handling, submission of specimen other than nasopharyngeal swab, presence of viral mutation(s) within the areas targeted by this assay, and inadequate number of viral copies (<131 copies/mL). A negative result must be combined with clinical observations, patient history, and epidemiological information. The expected result is Negative.  Fact Sheet for Patients:  PinkCheek.be  Fact Sheet for Healthcare Providers:  GravelBags.it  This test is no t yet approved or cleared by the [place] FDA and  has been authorized for detection and/or diagnosis of SARS-CoV-2 by FDA under an Emergency Use Authorization (EUA).  This EUA will remain  in effect (meaning this test can be used) for the duration of the COVID-19 declaration under Section 564(b)(1) of the Act, 21 U.S.C. section 360bbb-3(b)(1), unless the authorization is terminated or revoked sooner.     Influenza A by PCR NEGATIVE NEGATIVE Final   Influenza B by PCR NEGATIVE NEGATIVE Final    Comment: (NOTE) The Xpert Xpress SARS-CoV-2/FLU/RSV assay is intended as an aid in  the diagnosis of influenza from Nasopharyngeal swab specimens and  should not be used as a sole basis for treatment. Nasal washings and  aspirates are unacceptable for Xpert Xpress SARS-CoV-2/FLU/RSV  testing.  Fact Sheet for Patients: PinkCheek.be  Fact Sheet for Healthcare Providers: GravelBags.it  This test is not yet approved or cleared by the [place] FDA and  has been authorized for detection and/or diagnosis of SARS-CoV-2 by  FDA under an Emergency Use Authorization (EUA).  This EUA will remain  in effect (meaning this test can be used) for the duration of the  Covid-19 declaration under Section 564(b)(1) of the Act, 21  U.S.C. section 360bbb-3(b)(1), unless the authorization is  terminated or revoked. Performed at [facility], [place] [address] [place], [place] [address]          Radiology Studies: US RENAL  Result Date: 05/07/2020 CLINICAL DATA:  Acute renal injury. EXAM: RENAL / URINARY TRACT ULTRASOUND COMPLETE COMPARISON:  Renal ultrasound 02/26/2020, CT from renal biopsy 02/29/2020 FINDINGS: Right Kidney: Renal measurements: 12.1 x 6.5 x 6.2 cm = volume: 256 mL. 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 Mild increased renal echogenicity. No mass or hydronephrosis visualized. Left Kidney: Renal measurements: 16.3 x 7.6 x 6.7 cm = volume: 436 mL. 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 Borderline increased parenchymal echogenicity. No mass or hydronephrosis visualized. Bladder: Layering debris in the bladder.  No bladder wall thickening. Other: None. IMPRESSION: 1. No obstructive uropathy. 2. 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 3. Mildly increased bilateral renal echogenicity suggesting chronic medical renal disease. 4. Layering debris in the bladder. Electronically Signed   By: Keith Rake M.D.   On: 05/07/2020 23:28        Scheduled Meds:  sodium chloride   Intravenous Once   carvedilol  6.25 mg Oral BID WC   ferrous sulfate  325 mg Oral Q breakfast   heparin  5,000 Units Subcutaneous Q8H   insulin aspart  0-9 Units Subcutaneous TID WC   insulin detemir  15 Units Subcutaneous Daily   mupirocin cream   Topical Daily   Continuous Infusions:  sodium chloride 125 mL/hr at 05/08/20 0508     LOS: 1 day    Time spent: 30 minutes    Barb Merino, MD [facility] Pager [phone]

## 2020-05-08 NOTE — Progress Notes (Signed)
NEW ADMISSION NOTE New Admission Note:   Arrival Method: [facility] stretcher Mental Orientation: Alert and oriented  X 4 Telemetry: #5 NSR Assessment: Completed Skin: Wound on bilateral feet,dorsal surface ,assessed with Renato R.N.See [place] Nurse documentation.Dressing appplied. GX:IVHS A/C SL Pain:Denies Tubes:None. Safety Measures: Safety Fall Prevention Plan has been given, discussed and signed Admission: Completed [facility] Orientation: Patient has been orientated to the room, unit and staff.  Family:  Orders have been reviewed and implemented. Will continue to monitor the patient. Call light has been placed within reach and bed alarm has been activated.   [place], Zenon Mayo, RN

## 2020-05-08 NOTE — Consult Note (Addendum)
[facility] Nurse Consult Note: Reason for Consult: Consult requested for bilat feet.  Pt states his feet developed sudden swelling and blistering recently, then when the blisters ruptured wounds evolved to those locations. Wound type: Left and right anterior feet with full thickness wounds;  left foot 5X8X.2cm, 100% dry yellow woundbed, no odor, drainage, or fluctuance.  Right anterior foot with full thickness wounds; 9X8X.2cm in patchy areas separated by narrow islands of skin , 100% dry yellow woundbed, no odor, drainage, or fluctuance.  Dressing procedure/placement/frequency: Topical treatment orders provided for bedside nurses to perform as follows to promote healing and provide antimicrobial benefits: Apply Bactroban to bilat foot wounds Q day, then cover with gauze and kerlex. Please re-consult if further assistance is needed.  Thank-you,  Julien Girt MSN, [place], [place], [place], [place]

## 2020-05-08 NOTE — Plan of Care (Signed)
  Problem: Activity: Goal: Risk for activity intolerance will decrease Outcome: Progressing   

## 2020-05-08 NOTE — ED Notes (Signed)
Report given to [place], Gwenlyn Perking. Initiating [facility]

## 2020-05-09 LAB — BASIC METABOLIC PANEL
Anion gap: 11 (ref 5–15)
BUN: 48 mg/dL — ABNORMAL HIGH (ref 6–20)
CO2: 19 mmol/L — ABNORMAL LOW (ref 22–32)
Calcium: 8 mg/dL — ABNORMAL LOW (ref 8.9–10.3)
Chloride: 108 mmol/L (ref 98–111)
Creatinine, Ser: 6.48 mg/dL — ABNORMAL HIGH (ref 0.61–1.24)
GFR, Estimated: 11 mL/min — ABNORMAL LOW (ref >60)
Glucose, Bld: 67 mg/dL — ABNORMAL LOW (ref 70–99)
Potassium: 4.3 mmol/L (ref 3.5–5.1)
Sodium: 138 mmol/L (ref 135–145)

## 2020-05-09 LAB — BPAM RBC
Blood Product Expiration Date: 202111252359
ISSUE DATE / TIME: 202110280012
Unit Type and Rh: 5100

## 2020-05-09 LAB — PROTIME-INR
INR: 1.1 (ref 0.8–1.2)
Prothrombin Time: 14.2 seconds (ref 11.4–15.2)

## 2020-05-09 LAB — GLUCOSE, CAPILLARY
Glucose-Capillary: 94 mg/dL (ref 70–99)
Glucose-Capillary: 93 mg/dL (ref 70–99)
Glucose-Capillary: 103 mg/dL — ABNORMAL HIGH (ref 70–99)

## 2020-05-09 LAB — TYPE AND SCREEN
ABO/RH(D): O POS
Antibody Screen: NEGATIVE
Unit division: 0

## 2020-05-09 LAB — PHOSPHORUS: Phosphorus: 5.4 mg/dL — ABNORMAL HIGH (ref 2.5–4.6)

## 2020-05-09 LAB — APTT: aPTT: 38 seconds — ABNORMAL HIGH (ref 24–36)

## 2020-05-09 MED ORDER — DM-GUAIFENESIN ER 30-600 MG PO TB12: 1.0000 | ORAL_TABLET | Freq: Two times a day (BID) | ORAL | Status: DC | PRN

## 2020-05-09 MED ORDER — ACETAMINOPHEN 325 MG PO TABS: 650.0000 mg | ORAL_TABLET | Freq: Four times a day (QID) | ORAL | Status: DC | PRN

## 2020-05-09 MED ORDER — HEPARIN SODIUM (PORCINE) 5000 UNIT/ML IJ SOLN: 5000.0000 [IU] | Freq: Three times a day (TID) | INTRAMUSCULAR | Status: DC

## 2020-05-09 MED ORDER — SENNOSIDES-DOCUSATE SODIUM 8.6-50 MG PO TABS: 1.0000 | ORAL_TABLET | Freq: Every evening | ORAL | Status: DC | PRN

## 2020-05-09 NOTE — Progress Notes (Addendum)
[facility] Progress Note   34 y.o. male IDDM HTN CKD4 with a baseline creatinine of ~4 p/w w n/v for a few days. He has b/l and was not able to maintain any PO.  He has been getting fluid boluses and Zofran + creatinine was up to 6.5 He denies cough, fever, chills, myalgias, abdominal pain or dysuria. He is anemic with a negative guaiac in the ED. He is followed by Dr. 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 UPC 2.7 with neg ANA, ANCA, DSDNA, neg SPEP with no ACEI bec of advanced renal disease. 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 - No e/o obstruction on ultrasound. - He's had laser therapy for retinopathy and known DM for at least 10 years.  - He may have progressed to ESRD; unfortunately that we could not biopsy him because of the horseshoe kidney and poor window. Very likely has progression of renal disease and already with advanced disease with limited reserve.  - Reasonable to hydrate with isotonic fluids given history of not being able to keep fluids or solids down; labs this am ->  Requested this am and stable renal function but not much improvement.  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  Will give another liter of isotonic fluid for now.  @506P  I counseled and educated the patient again about the various modalities and options for dialysis. His understanding is not great but hopefully with repeated education he will come to a good understanding. Even after discussing dialysis and the long term commitment to TIW 4hr treatments at an outpt center he was asking about removing the catheter after 2-3 treatments. Reiterated  the need for an access (cath, graft and fistula). He did not have further questions and I reassured him that I would be rounding this weekend to answer anymore questions that may come up. He is willing to proceed with the dialysis catheter placement tomorrow.   Will request tunneled catheter by VIR and initiate HD tomorrow   2. Anemia - will need to be loaded with IV iron as well; transfuse as needed. Feraheme 510 10/28; will give 2nd dose next week. 3. Renal osteodystrophy - will check a phos and PTH. 4. HTN 5. DM  Subjective:   Still has nausea but able to keep some food down yesterday.   Objective:   BP (!) 147/87 (BP Location: Left Arm)   Pulse 85   Temp 98.4 F (36.9 C)   Resp 18   Ht 5\' 10"  (1.778 m)   Wt 99.8 kg   SpO2 91%   BMI 31.57 kg/m   Intake/Output Summary (Last 24 hours) at 05/09/2020 0723 Last data filed at 05/09/2020 0715 Gross per 24 hour  Intake 1657.07 ml  Output 800 ml  Net 857.07 ml   Weight change:   Physical Exam: GEN: NAD, A&Ox3, NCAT HEENT: No conjunctival pallor, EOMI NECK: Supple, no thyromegaly LUNGS: CTA B/L no rales, rhonchi or wheezing CV: RRR, No M/R/G ABD: SNDNT +BS  EXT: 1+ lower extremity edema, L>R    Imaging: US RENAL  Result Date: 05/07/2020 CLINICAL DATA:  Acute renal injury. EXAM: RENAL / URINARY TRACT ULTRASOUND COMPLETE COMPARISON:  Renal ultrasound 02/26/2020, CT from renal biopsy 02/29/2020 FINDINGS: Right Kidney: Renal measurements: 12.1 x 6.5 x 6.2 cm = volume: 256 mL. 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 Mild increased renal echogenicity. No mass or hydronephrosis visualized. Left Kidney: Renal measurements: 16.3 x 7.6 x 6.7 cm = volume: 436 mL. 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 Borderline increased parenchymal echogenicity. No mass or hydronephrosis visualized. Bladder: Layering debris in the bladder.  No bladder wall thickening. Other:  None. IMPRESSION: 1. No obstructive uropathy. 2. 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 3. Mildly increased bilateral renal echogenicity suggesting chronic medical renal disease. 4. Layering debris in the bladder. Electronically Signed   By: Keith Rake M.D.   On: 05/07/2020 23:28    Labs: BMET Recent Labs  Lab 05/07/20 1824 05/08/20 0759  NA 141 140  K 4.9 4.7  CL 105 110  CO2 22 22  GLUCOSE 118* 127*  BUN 52* 51*  CREATININE 6.50* 6.50*  CALCIUM 8.3* 7.5*  PHOS  --  5.5*   CBC Recent Labs  Lab 05/07/20 1824 05/08/20 0639  WBC 12.9* 11.3*  HGB 6.9* 7.2*  HCT 22.2* 22.8*  MCV 84.1 85.1  PLT 307 275    Medications:    . sodium chloride   Intravenous Once  . carvedilol  6.25 mg Oral BID WC  . ferrous sulfate  325 mg Oral Q breakfast  . heparin  5,000 Units Subcutaneous Q8H  . insulin aspart  0-9 Units Subcutaneous TID WC  . insulin detemir  15 Units Subcutaneous Daily  . mupirocin cream   Topical Daily      Otelia Santee, MD 05/09/2020, 7:23 AM

## 2020-05-09 NOTE — Progress Notes (Signed)
PROGRESS NOTE    CHARELS Velez  OZH:[id number] DOB: 1986-02-28 DOA: 05/07/2020 PCP: Pcp, No   Brief Narrative:  34 year old gentleman with history of type 2 diabetes on insulin, hypertension and chronic kidney disease stage IV.  Recent multiple admissions.  Baseline creatinine about 4.  Admitted to the hospital with nausea and vomiting for past several days.  Previous episodes with nausea vomiting and similar problems with AKI on CKD.  Also has bilateral leg swelling.  He was not able to keep anything down so came to the ER. In the emergency room he was given some fluid boluses and Zofran.  Hemoglobin was 6.9.  Creatinine was 6.5.   Assessment & Plan:   Principal Problem:   Acute renal failure superimposed on stage 4 chronic kidney disease (HCC) Active Problems:   Essential hypertension   DM2 (diabetes mellitus, type 2) (HCC)   Nausea and vomiting   Anemia of chronic kidney failure  Acute renal failure superimposed on stage IV chronic kidney disease: -Unfortunately this has now progressed to ESRD.  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  No obstruction seen on renal ultrasound. -Plans for tunneled catheter today and HD tomorrow -IV fluids, monitor urine output -Check phosphorus and PTH  Nausea vomiting likely secondary to gastroparesis and underlying uremia -Supportive care  Anemia of chronic kidney disease and iron deficiency -Hemoglobin slightly stable.  Iron supplements  Diabetes mellitus type 2, poorly controlled due to hyperglycemia -Levemir 15 units daily, sliding scale Accu-Chek  Essential hypertension -Coreg twice daily      DVT prophylaxis: Subcu heparin Code Status: Full code Family Communication: None  Status is: Inpatient  Remains inpatient appropriate because:Inpatient level of care appropriate due to severity of illness   Dispo: The patient is from: Home              Anticipated d/c is to: Home              Anticipated d/c  date is: 3 days              Patient currently is not medically stable to d/c.  Plan for dialysis session tomorrow depending on his tolerance still that require outpatient dialysis placement.  Anticipate him being in the hospital released next 3-4 days       Body mass index is 31.57 kg/m.     Subjective: Feels okay no new complaints. Resting at the time waiting for his procedure  Review of Systems Otherwise negative except as per HPI, including: General: Denies fever, chills, night sweats or unintended weight loss. Resp: Denies cough, wheezing, shortness of breath. Cardiac: Denies chest pain, palpitations, orthopnea, paroxysmal nocturnal dyspnea. GI: Denies abdominal pain, nausea, vomiting, diarrhea or constipation GU: Denies dysuria, frequency, hesitancy or incontinence MS: Denies muscle aches, joint pain or swelling Neuro: Denies headache, neurologic deficits (focal weakness, numbness, tingling), abnormal gait Psych: Denies anxiety, depression, SI/HI/AVH Skin: Denies new rashes or lesions ID: Denies sick contacts, exotic exposures, travel  Examination:  General exam: Appears calm and comfortable  Respiratory system: Clear to auscultation. Respiratory effort normal. Cardiovascular system: S1 & S2 heard, RRR. No JVD, murmurs, rubs, gallops or clicks. No pedal edema. Gastrointestinal system: Abdomen is nondistended, soft and nontender. No organomegaly or masses felt. Normal bowel sounds heard. Central nervous system: Alert and oriented. No focal neurological deficits. Extremities: Symmetric 5 x 5 power. Skin: No rashes, lesions or ulcers Psychiatry: Judgement and insight appear normal. Mood & affect appropriate.  Objective: Vitals:   05/08/20 2214 05/09/20 0302 05/09/20 0618 05/09/20 0918  BP: (!) 146/81 (!) 150/90 (!) 147/87 (!) 169/91  Pulse: 88 84 85 89  Resp: 18 18 18 17   Temp: 99.6 F (37.6 C) 98.6 F (37 C) 98.4 F (36.9 C) 97.9 F (36.6 C)  TempSrc: Oral  Oral    SpO2: 93% 95% 91% 94%  Weight: 99.8 kg     Height:        Intake/Output Summary (Last 24 hours) at 05/09/2020 1150 Last data filed at 05/09/2020 0800 Gross per 24 hour  Intake 1777.07 ml  Output 800 ml  Net 977.07 ml   Filed Weights   05/08/20 1817 05/08/20 2214  Weight: 99.8 kg 99.8 kg     Data Reviewed:   CBC: Recent Labs  Lab 05/07/20 1824 05/08/20 0639  WBC 12.9* 11.3*  HGB 6.9* 7.2*  HCT 22.2* 22.8*  MCV 84.1 85.1  PLT 307 259   Basic Metabolic Panel: Recent Labs  Lab 05/07/20 1824 05/08/20 0759 05/09/20 0835  NA 141 140 138  K 4.9 4.7 4.3  CL 105 110 108  CO2 22 22 19*  GLUCOSE 118* 127* 67*  BUN 52* 51* 48*  CREATININE 6.50* 6.50* 6.48*  CALCIUM 8.3* 7.5* 8.0*  MG  --  1.9  --   PHOS  --  5.5* 5.4*   GFR: Estimated Creatinine Clearance: 18.8 mL/min (A) (by C-G formula based on SCr of 6.48 mg/dL (H)). Liver Function Tests: Recent Labs  Lab 05/07/20 1824  AST 14*  ALT 18  ALKPHOS 73  BILITOT 0.7  PROT 6.8  ALBUMIN 2.9*   Recent Labs  Lab 05/07/20 1824  LIPASE 19   No results for input(s): AMMONIA in the last 168 hours. Coagulation Profile: Recent Labs  Lab 05/09/20 0835  INR 1.1   Cardiac Enzymes: Recent Labs  Lab 05/07/20 1824  CKTOTAL 967*   BNP (last 3 results) No results for input(s): PROBNP in the last 8760 hours. HbA1C: Recent Labs    05/07/20 2238  HGBA1C 6.1*   CBG: Recent Labs  Lab 05/08/20 1133 05/08/20 1259 05/08/20 1640 05/08/20 2215 05/09/20 1126  GLUCAP 119* 106* 85 98 94   Lipid Profile: No results for input(s): CHOL, HDL, LDLCALC, TRIG, CHOLHDL, LDLDIRECT in the last 72 hours. Thyroid Function Tests: No results for input(s): TSH, T4TOTAL, FREET4, T3FREE, THYROIDAB in the last 72 hours. Anemia Panel: Recent Labs    05/07/20 2244  VITAMINB12 564  FOLATE 8.9  FERRITIN 358*  TIBC 192*  IRON 12*  RETICCTPCT 1.0   Sepsis Labs: No results for input(s): PROCALCITON, LATICACIDVEN in  the last 168 hours.  Recent Results (from the past 240 hour(s))  Respiratory Panel by RT PCR (Flu A&B, Covid) - Nasopharyngeal Swab     Status: None   Collection Time: 05/07/20  8:24 PM   Specimen: Nasopharyngeal Swab  Result Value Ref Range Status   SARS Coronavirus 2 by RT PCR NEGATIVE NEGATIVE Final    Comment: (NOTE) SARS-CoV-2 target nucleic acids are NOT DETECTED.  The SARS-CoV-2 RNA is generally detectable in upper respiratoy specimens during the acute phase of infection. The lowest concentration of SARS-CoV-2 viral copies this assay can detect is 131 copies/mL. A negative result does not preclude SARS-Cov-2 infection and should not be used as the sole basis for treatment or other patient management decisions. A negative result may occur with  improper specimen collection/handling, submission of specimen other than nasopharyngeal swab, presence of  viral mutation(s) within the areas targeted by this assay, and inadequate number of viral copies (<131 copies/mL). A negative result must be combined with clinical observations, patient history, and epidemiological information. The expected result is Negative.  Fact Sheet for Patients:  PinkCheek.be  Fact Sheet for Healthcare Providers:  GravelBags.it  This test is no t yet approved or cleared by the [place] FDA and  has been authorized for detection and/or diagnosis of SARS-CoV-2 by FDA under an Emergency Use Authorization (EUA). This EUA will remain  in effect (meaning this test can be used) for the duration of the COVID-19 declaration under Section 564(b)(1) of the Act, 21 U.S.C. section 360bbb-3(b)(1), unless the authorization is terminated or revoked sooner.     Influenza A by PCR NEGATIVE NEGATIVE Final   Influenza B by PCR NEGATIVE NEGATIVE Final    Comment: (NOTE) The Xpert Xpress SARS-CoV-2/FLU/RSV assay is intended as an aid in  the diagnosis of  influenza from Nasopharyngeal swab specimens and  should not be used as a sole basis for treatment. Nasal washings and  aspirates are unacceptable for Xpert Xpress SARS-CoV-2/FLU/RSV  testing.  Fact Sheet for Patients: PinkCheek.be  Fact Sheet for Healthcare Providers: GravelBags.it  This test is not yet approved or cleared by the [place] FDA and  has been authorized for detection and/or diagnosis of SARS-CoV-2 by  FDA under an Emergency Use Authorization (EUA). This EUA will remain  in effect (meaning this test can be used) for the duration of the  Covid-19 declaration under Section 564(b)(1) of the Act, 21  U.S.C. section 360bbb-3(b)(1), unless the authorization is  terminated or revoked. Performed at [facility], [place] [address], [place], [place] [address]          Radiology Studies: US RENAL  Result Date: 05/07/2020 CLINICAL DATA:  Acute renal injury. EXAM: RENAL / URINARY TRACT ULTRASOUND COMPLETE COMPARISON:  Renal ultrasound 02/26/2020, CT from renal biopsy 02/29/2020 FINDINGS: Right Kidney: Renal measurements: 12.1 x 6.5 x 6.2 cm = volume: 256 mL. 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 Mild increased renal echogenicity. No mass or hydronephrosis visualized. Left Kidney: Renal measurements: 16.3 x 7.6 x 6.7 cm = volume: 436 mL. 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 Borderline increased parenchymal echogenicity. No mass or hydronephrosis visualized. Bladder: Layering debris in the bladder.  No bladder wall thickening. Other: None. IMPRESSION: 1. No obstructive uropathy. 2. 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 3. Mildly increased bilateral renal echogenicity suggesting chronic medical renal disease. 4. Layering debris in the bladder. Electronically Signed   By: Keith Rake M.D.    On: 05/07/2020 23:28        Scheduled Meds: . sodium chloride   Intravenous Once  . carvedilol  6.25 mg Oral BID WC  . ferrous sulfate  325 mg Oral Q breakfast  . [START ON 05/10/2020] heparin  5,000 Units Subcutaneous Q8H  . insulin aspart  0-9 Units Subcutaneous TID WC  . insulin detemir  15 Units Subcutaneous Daily  . mupirocin cream   Topical Daily   Continuous Infusions: . sodium chloride 150 mL/hr at 05/09/20 1130     LOS: 2 days   Time spent= 35 mins    Mataeo Ingwersen Arsenio Loader, MD [facility]  If 7PM-7AM, please contact night-coverage  05/09/2020, 11:50 AM

## 2020-05-09 NOTE — Plan of Care (Signed)
  Problem: Nutrition: Goal: Adequate nutrition will be maintained Outcome: Progressing   

## 2020-05-09 NOTE — Progress Notes (Signed)
IR requested by Dr. Augustin Coupe for possible image-guided tunneled HD catheter placement.  Went to see patient for assessment prior to HD catheter placement. At this time, patient is refusing placement until he has further discussions with nephrology regarding his dialysis (he has questions regarding how long he will be on dialysis, how long he will have this catheter, stating "I do not want to be lied to, is this something I am going to have to live with forever?"). Discussed with Dr. Augustin Coupe- he is aware and states he will speak with patient again today, he requests we keep patient on schedule for tentative procedure tomorrow in IR. Plan for image-guided tunneled HD catheter in IR tentatively for tomorrow pending IR scheduling and patient's agreement with procedure. Patient's diet restarted for today, patient will be NPO at midnight. INR 1.1 today. Patient will be seen/consented for procedure tomorrow AM. 54M RN aware of above.  IR to follow.   Bea Graff Danetta Prom, PA-C 05/09/2020, 1:00 PM

## 2020-05-10 LAB — BASIC METABOLIC PANEL
Anion gap: 8 (ref 5–15)
BUN: 49 mg/dL — ABNORMAL HIGH (ref 6–20)
CO2: 21 mmol/L — ABNORMAL LOW (ref 22–32)
Calcium: 7.8 mg/dL — ABNORMAL LOW (ref 8.9–10.3)
Chloride: 111 mmol/L (ref 98–111)
Creatinine, Ser: 6.46 mg/dL — ABNORMAL HIGH (ref 0.61–1.24)
GFR, Estimated: 11 mL/min — ABNORMAL LOW (ref >60)
Glucose, Bld: 74 mg/dL (ref 70–99)
Potassium: 4.6 mmol/L (ref 3.5–5.1)
Sodium: 140 mmol/L (ref 135–145)

## 2020-05-10 LAB — GLUCOSE, CAPILLARY
Glucose-Capillary: 74 mg/dL (ref 70–99)
Glucose-Capillary: 68 mg/dL — ABNORMAL LOW (ref 70–99)
Glucose-Capillary: 95 mg/dL (ref 70–99)
Glucose-Capillary: 81 mg/dL (ref 70–99)

## 2020-05-10 LAB — MAGNESIUM: Magnesium: 1.7 mg/dL (ref 1.7–2.4)

## 2020-05-10 MED ORDER — SENNOSIDES-DOCUSATE SODIUM 8.6-50 MG PO TABS: 1.0000 | ORAL_TABLET | Freq: Every evening | ORAL | 0 refills | Status: DC | PRN

## 2020-05-10 MED ORDER — DARBEPOETIN ALFA 150 MCG/0.3ML IJ SOSY: 150.0000 ug | PREFILLED_SYRINGE | Freq: Once | INTRAMUSCULAR | 0 refills | Status: AC

## 2020-05-10 MED ORDER — DEXTROSE 50 % IV SOLN
INTRAVENOUS | Status: AC
  Filled 2020-05-10: qty 50

## 2020-05-10 MED ORDER — INSULIN DETEMIR 100 UNIT/ML ~~LOC~~ SOLN: 15.0000 [IU] | Freq: Every day | SUBCUTANEOUS | 11 refills | Status: DC

## 2020-05-10 MED ORDER — DARBEPOETIN ALFA 150 MCG/0.3ML IJ SOSY
150.0000 ug | PREFILLED_SYRINGE | Freq: Once | INTRAMUSCULAR | Status: AC
  Administered 2020-05-10: 150 ug via SUBCUTANEOUS
  Filled 2020-05-10 (×2): qty 0.3

## 2020-05-10 MED ORDER — DEXTROSE 50 % IV SOLN
12.5000 g | INTRAVENOUS | Status: AC
  Administered 2020-05-10: 12.5 g via INTRAVENOUS

## 2020-05-10 NOTE — Progress Notes (Addendum)
[facility] Progress Note   34 y.o.maleIDDM HTN CKD4 with a baseline creatinine of ~4 p/w w n/v for a few days. He has b/l and was not able to maintain any PO.  He has been getting fluid boluses and Zofran + creatinine was up to 6.5 He denies cough, fever, chills, myalgias, abdominal pain or dysuria. He is anemic with a negative guaiac in the ED. He is followed by Dr. 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 UPC 2.7 with neg ANA, ANCA, DSDNA, neg SPEPwith no ACEI bec of advanced renal disease. 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 - No e/o obstruction on ultrasound. - He's had laser therapy for retinopathy and known DM for at least 10 years.  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 Already with advanced disease with limited reserve.   I counseled and educated the patient on multiple occasions  about the various modalities and options for dialysis. His understanding is not great but I am hopeful that with repeated education he will come to a good understanding. I educated again this AM and he understands that he is closing in on dialysis; he agrees to f/u with Dr. Candiss Norse in next 1-2 weeks as well as having an access placement by [facility].  Appreciate [facility] being willing to place a  tunneled catheter on Saturday but fortunately patient is feeling somewhat better with no further nausea and renal function is not improved but fairly stable. I spoke with Dr. Earleen Newport already this AM.  From renal standpoint he can be followed closely at the [facility] office with Dr. Candiss Norse in the next 2 weeks with labs and we will refer to [facility] for permanent access placement as well. He is about  3-6 mths from initiating dialysisand would be great to avoid a catheter.  2. Anemia - will need to be loaded with IV iron as well; transfuse as needed. Feraheme 510 10/28; will give 2nd dose as outpatient if he is discharged o/w will give 2nd dose next week. - Will also give a dose of Aranesp 3. Renal osteodystrophy - phos 5.4-5.5 on repeat -> no binder for now. 4. HTN 5. DM  Subjective:   No longer has nausea and able to keep  food down yesterday. Denies f/c/ dysnpea   Objective:   BP (!) 152/87 (BP Location: Right Arm)   Pulse 82   Temp 99.4 F (37.4 C) (Oral)   Resp 18   Ht 5\' 10"  (1.778 m)   Wt 104.7 kg   SpO2 93%   BMI 33.12 kg/m   Intake/Output Summary (Last 24 hours) at 05/10/2020 0701 Last data filed at 05/10/2020 0600 Gross per 24 hour  Intake 3174.61 ml  Output 500 ml  Net 2674.61 ml   Weight change: 4.9 kg  Physical Exam:   GEN: NAD, A&Ox3, NCAT HEENT: No conjunctival pallor, EOMI NECK: Supple, no thyromegaly LUNGS: CTA B/L no rales, rhonchi or wheezing CV: RRR, No M/R/G ABD: SNDNT +BS  EXT: 1+ lower extremity edema, L>R     Imaging:  Imaging Results (Last 48 hours)  US RENAL  Result Date: 05/07/2020 CLINICAL DATA:  Acute renal injury. EXAM:  RENAL / URINARY TRACT ULTRASOUND COMPLETE COMPARISON:  Renal ultrasound 02/26/2020, CT from renal biopsy 02/29/2020 FINDINGS: Right Kidney: Renal measurements: 12.1 x 6.5 x 6.2 cm = volume: 256 mL. 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 Mild increased renal echogenicity. No mass or hydronephrosis visualized. Left Kidney: Renal measurements: 16.3 x 7.6 x 6.7 cm = volume: 436 mL. 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 Borderline increased parenchymal echogenicity. No mass or hydronephrosis visualized. Bladder: Layering debris in the bladder.  No bladder wall thickening. Other: None. IMPRESSION: 1. No obstructive uropathy. 2. Horseshoe  kidney configuration on CT is not well demonstrated by ultrasound. 3. Mildly increased bilateral renal echogenicity suggesting chronic medical renal disease. 4. Layering debris in the bladder. Electronically Signed   By: Keith Rake M.D.   On: 05/07/2020 23:28     Labs: BMET Last Labs       Recent Labs  Lab 05/07/20 1824 05/08/20 0759  NA 141 140  K 4.9 4.7  CL 105 110  CO2 22 22  GLUCOSE 118* 127*  BUN 52* 51*  CREATININE 6.50* 6.50*  CALCIUM 8.3* 7.5*  PHOS  --  5.5*     CBC Last Labs       Recent Labs  Lab 05/07/20 1824 05/08/20 0639  WBC 12.9* 11.3*  HGB 6.9* 7.2*  HCT 22.2* 22.8*  MCV 84.1 85.1  PLT 307 275      Medications:    . sodium chloride   Intravenous Once  . carvedilol  6.25 mg Oral BID WC  . ferrous sulfate  325 mg Oral Q breakfast  . heparin  5,000 Units Subcutaneous Q8H  . insulin aspart  0-9 Units Subcutaneous TID WC  . insulin detemir  15 Units Subcutaneous Daily  . mupirocin cream   Topical Daily      Otelia Santee, MD 05/09/2020, 7:23 AM      Revision History                               Note Details  Author Dwana Melena, MD File Time 05/09/2020 5:09 PM  Author Type Physician Status Addendum  Last Editor Dwana Melena, MD Service Nephrology  Hospital Acct # [id number] [place] Date 05/07/2020     Imaging: No results found.  Labs: BMET Recent Labs  Lab 05/07/20 1824 05/08/20 0759 05/09/20 0835 05/10/20 0108  NA 141 140 138 140  K 4.9 4.7 4.3 4.6  CL 105 110 108 111  CO2 22 22 19* 21*  GLUCOSE 118* 127* 67* 74  BUN 52* 51* 48* 49*  CREATININE 6.50* 6.50* 6.48* 6.46*  CALCIUM 8.3* 7.5* 8.0* 7.8*  PHOS  --  5.5* 5.4*  --    CBC Recent Labs  Lab 05/07/20 1824 05/08/20 0639  WBC 12.9* 11.3*  HGB 6.9* 7.2*  HCT 22.2* 22.8*  MCV 84.1 85.1  PLT 307 275    Medications:    . sodium chloride   Intravenous Once  . carvedilol  6.25 mg Oral BID WC  . ferrous sulfate  325 mg Oral Q breakfast  .  heparin  5,000 Units Subcutaneous Q8H  . insulin aspart  0-9 Units Subcutaneous TID WC  . insulin detemir  15 Units Subcutaneous Daily  . mupirocin cream   Topical Daily      Otelia Santee, MD 05/10/2020, 7:01 AM

## 2020-05-10 NOTE — Progress Notes (Signed)
Hypoglycemic Event  CBG: 68  Treatment: D50 25 mL (12.5 gm)  Symptoms: Shaky  Follow-up CBG: [id number] CBG Result:95  Possible Reasons for Event: Inadequate meal intake  Comments/MD notified:Per hypoglycemia protocol    Malahki Gasaway Joselita

## 2020-05-10 NOTE — Discharge Summary (Signed)
Physician Discharge Summary  [name] JAS:[id number] DOB: [birth year] DOA: 05/07/2020  PCP: Pcp, No  Admit date: 05/07/2020 Discharge date: 05/10/2020  Admitted From: Home Disposition:  Home  Recommendations for Outpatient Follow-up:  1. Follow up with PCP in 1-2 weeks 2. Please obtain BMP/CBC in one week your next doctors visit.  3. Detemir reduced to 15 units daily 4. Nephrology to coordinate care outpatient for follow-up for hemodialysis and vascular for access placement   Discharge Condition: Stable CODE STATUS: Full code Diet recommendation: Diabetic/renal  Brief/Interim Summary: 34 year old gentleman with history of type 2 diabetes on insulin, hypertension and chronic kidney disease stage IV. Recent multiple admissions. Baseline creatinine about 4. Admitted to the hospital with nausea and vomiting for past several days. Previous episodes with nausea vomiting and similar problems with AKI on CKD. Also has bilateral leg swelling. He was not able to keep anything down so came to the ER. In the emergency room he was given some fluid boluses and Zofran. Hemoglobin was 6.9. Creatinine was 6.5. Patient symptomatically started doing better with IV fluids therefore after couple days of hospital observation, nephrology recommended outpatient follow-up closely with nephrology and vascular for HD catheter placement. Stable for discharge today. Insulin dose has been reduced.   Assessment & Plan:   Principal Problem:   Acute renal failure superimposed on stage 4 chronic kidney disease (HCC) Active Problems:   Essential hypertension   DM2 (diabetes mellitus, type 2) (HCC)   Nausea and vomiting   Anemia of chronic kidney failure  Acute renal failure superimposed on stage IV chronic kidney disease: -Unfortunately this has now progressed to ESRD.  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  No obstruction seen on renal ultrasound. -Initial plans was to place  catheter in-house for hemodialysis but due to stabilization of his kidney function and improvement in nausea vomiting, nephrology deferred this to completed outpatient with close follow-up. They will also arrange for follow-up with vascular.  Nausea vomiting likely secondary to gastroparesis and underlying uremia -Supportive care  Anemia of chronic kidney disease and iron deficiency -Hemoglobin slightly stable.  Iron supplements  Diabetes mellitus type 2, poorly controlled due to hyperglycemia -Levemir 15 units daily, sliding scale Accu-Chek  Essential hypertension -Coreg twice daily      Body mass index is 33.12 kg/m.         Discharge Diagnoses:  Principal Problem:   Acute renal failure superimposed on stage 4 chronic kidney disease (HCC) Active Problems:   Essential hypertension   DM2 (diabetes mellitus, type 2) (HCC)   Nausea and vomiting   Anemia of chronic kidney failure    Subjective: Feels great no complaints, wishes to go home  Discharge Exam: Vitals:   05/10/20 0436 05/10/20 0949  BP: (!) 152/87 (!) 163/98  Pulse: 82 82  Resp: 18 18  Temp: 99.4 F (37.4 C) 99.2 F (37.3 C)  SpO2: 93% 92%   Vitals:   05/09/20 1739 05/09/20 2032 05/10/20 0436 05/10/20 0949  BP: (!) 158/96 (!) 149/82 (!) 152/87 (!) 163/98  Pulse: 79 88 82 82  Resp: 18 20 18 18   Temp: 99.6 F (37.6 C) 100.3 F (37.9 C) 99.4 F (37.4 C) 99.2 F (37.3 C)  TempSrc: Oral Oral Oral Oral  SpO2: 96% 94% 93% 92%  Weight:  104.7 kg    Height:        General: Pt is alert, awake, not in acute distress Cardiovascular: RRR, S1/S2 +, no rubs, no gallops Respiratory:  CTA bilaterally, no wheezing, no rhonchi Abdominal: Soft, NT, ND, bowel sounds + Extremities: no edema, no cyanosis  Discharge Instructions   Allergies as of 05/10/2020      Reactions   Amlodipine Nausea And Vomiting, Other (See Comments)   Patient was taking Amlodipine and Hydralazine at the same time, so  the reactions came from one of the 2: Lethargy and an all-over feeling of NOT feeling well (also)   Hydralazine Nausea And Vomiting, Other (See Comments)   Patient was taking Hydralazine AND Amlodipine at the same time, so the reactions came from one of the 2: Lethargy and an all-over feeling of NOT feeling well (also)      Medication List    TAKE these medications   carvedilol 6.25 MG tablet Commonly known as: COREG Take 1 tablet (6.25 mg total) by mouth 2 (two) times daily with a meal.   Darbepoetin Alfa 150 MCG/0.3ML Sosy injection Commonly known as: ARANESP Inject 0.3 mLs (150 mcg total) into the skin once for 1 dose.   diphenhydramine-acetaminophen 25-500 MG Tabs tablet Commonly known as: TYLENOL PM Take 2 tablets by mouth at bedtime as needed (for sleep).   ferrous sulfate 325 (65 FE) MG tablet Take 1 tablet (325 mg total) by mouth daily with breakfast.   insulin detemir 100 UNIT/ML injection Commonly known as: LEVEMIR Inject 0.15 mLs (15 Units total) into the skin daily. What changed: how much to take   senna-docusate 8.6-50 MG tablet Commonly known as: Senokot-S Take 1 tablet by mouth at bedtime as needed for moderate constipation.       Allergies  Allergen Reactions  . Amlodipine Nausea And Vomiting and Other (See Comments)    Patient was taking Amlodipine and Hydralazine at the same time, so the reactions came from one of the 2: Lethargy and an all-over feeling of NOT feeling well (also)  . Hydralazine Nausea And Vomiting and Other (See Comments)    Patient was taking Hydralazine AND Amlodipine at the same time, so the reactions came from one of the 2: Lethargy and an all-over feeling of NOT feeling well (also)    You were cared for by a hospitalist during your hospital stay. If you have any questions about your discharge medications or the care you received while you were in the hospital after you are discharged, you can call the unit and asked to speak with the  hospitalist on call if the hospitalist that took care of you is not available. Once you are discharged, your primary care physician will handle any further medical issues. Please note that no refills for any discharge medications will be authorized once you are discharged, as it is imperative that you return to your primary care physician (or establish a relationship with a primary care physician if you do not have one) for your aftercare needs so that they can reassess your need for medications and monitor your lab values.   Procedures/Studies: US RENAL  Result Date: 05/07/2020 CLINICAL DATA:  Acute renal injury. EXAM: RENAL / URINARY TRACT ULTRASOUND COMPLETE COMPARISON:  Renal ultrasound 02/26/2020, CT from renal biopsy 02/29/2020 FINDINGS: Right Kidney: Renal measurements: 12.1 x 6.5 x 6.2 cm = volume: 256 mL. 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 Mild increased renal echogenicity. No mass or hydronephrosis visualized. Left Kidney: Renal measurements: 16.3 x 7.6 x 6.7 cm = volume: 436 mL. 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 Borderline increased parenchymal echogenicity. No mass  or hydronephrosis visualized. Bladder: Layering debris in the bladder.  No bladder wall thickening. Other: None. IMPRESSION: 1. No obstructive uropathy. 2. 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 3. Mildly increased bilateral renal echogenicity suggesting chronic medical renal disease. 4. Layering debris in the bladder. Electronically Signed   By: Keith Rake M.D.   On: 05/07/2020 23:28      The results of significant diagnostics from this hospitalization (including imaging, microbiology, ancillary and laboratory) are listed below for reference.     Microbiology: Recent Results (from the past 240 hour(s))  Respiratory Panel by RT PCR (Flu A&B, Covid) - Nasopharyngeal Swab     Status: None   Collection  Time: 05/07/20  8:24 PM   Specimen: Nasopharyngeal Swab  Result Value Ref Range Status   SARS Coronavirus 2 by RT PCR NEGATIVE NEGATIVE Final    Comment: (NOTE) SARS-CoV-2 target nucleic acids are NOT DETECTED.  The SARS-CoV-2 RNA is generally detectable in upper respiratoy specimens during the acute phase of infection. The lowest concentration of SARS-CoV-2 viral copies this assay can detect is 131 copies/mL. A negative result does not preclude SARS-Cov-2 infection and should not be used as the sole basis for treatment or other patient management decisions. A negative result may occur with  improper specimen collection/handling, submission of specimen other than nasopharyngeal swab, presence of viral mutation(s) within the areas targeted by this assay, and inadequate number of viral copies (<131 copies/mL). A negative result must be combined with clinical observations, patient history, and epidemiological information. The expected result is Negative.  Fact Sheet for Patients:  PinkCheek.be  Fact Sheet for Healthcare Providers:  GravelBags.it  This test is no t yet approved or cleared by the [place] FDA and  has been authorized for detection and/or diagnosis of SARS-CoV-2 by FDA under an Emergency Use Authorization (EUA). This EUA will remain  in effect (meaning this test can be used) for the duration of the COVID-19 declaration under Section 564(b)(1) of the Act, 21 U.S.C. section 360bbb-3(b)(1), unless the authorization is terminated or revoked sooner.     Influenza A by PCR NEGATIVE NEGATIVE Final   Influenza B by PCR NEGATIVE NEGATIVE Final    Comment: (NOTE) The Xpert Xpress SARS-CoV-2/FLU/RSV assay is intended as an aid in  the diagnosis of influenza from Nasopharyngeal swab specimens and  should not be used as a sole basis for treatment. Nasal washings and  aspirates are unacceptable for Xpert Xpress  SARS-CoV-2/FLU/RSV  testing.  Fact Sheet for Patients: PinkCheek.be  Fact Sheet for Healthcare Providers: GravelBags.it  This test is not yet approved or cleared by the [place] FDA and  has been authorized for detection and/or diagnosis of SARS-CoV-2 by  FDA under an Emergency Use Authorization (EUA). This EUA will remain  in effect (meaning this test can be used) for the duration of the  Covid-19 declaration under Section 564(b)(1) of the Act, 21  U.S.C. section 360bbb-3(b)(1), unless the authorization is  terminated or revoked. Performed at [facility], [place] [address] [place], [place] [address]      Labs: BNP (last 3 results) Recent Labs    12/02/19 0744 02/25/20 1127 03/10/20 1410  BNP 451.7* 694.9* 604.5*   Basic Metabolic Panel: Recent Labs  Lab 05/07/20 1824 05/08/20 0759 05/09/20 0835 05/10/20 0108  NA 141 140 138 140  K 4.9 4.7 4.3 4.6  CL 105 110 108 111  CO2 22 22 19* 21*  GLUCOSE 118* 127*  67* 74  BUN 52* 51* 48* 49*  CREATININE 6.50* 6.50* 6.48* 6.46*  CALCIUM 8.3* 7.5* 8.0* 7.8*  MG  --  1.9  --  1.7  PHOS  --  5.5* 5.4*  --    Liver Function Tests: Recent Labs  Lab 05/07/20 1824  AST 14*  ALT 18  ALKPHOS 73  BILITOT 0.7  PROT 6.8  ALBUMIN 2.9*   Recent Labs  Lab 05/07/20 1824  LIPASE 19   No results for input(s): AMMONIA in the last 168 hours. CBC: Recent Labs  Lab 05/07/20 1824 05/08/20 0639  WBC 12.9* 11.3*  HGB 6.9* 7.2*  HCT 22.2* 22.8*  MCV 84.1 85.1  PLT 307 275   Cardiac Enzymes: Recent Labs  Lab 05/07/20 1824  CKTOTAL 967*   BNP: Invalid input(s): POCBNP CBG: Recent Labs  Lab 05/09/20 1624 05/09/20 2031 05/10/20 0632 05/10/20 0712 05/10/20 1122  GLUCAP 93 103* 68* 95 81   D-Dimer No results for input(s): DDIMER in the last 72 hours. Hgb A1c Recent Labs    05/07/20 2238  HGBA1C 6.1*   Lipid Profile No results  for input(s): CHOL, HDL, LDLCALC, TRIG, CHOLHDL, LDLDIRECT in the last 72 hours. Thyroid function studies No results for input(s): TSH, T4TOTAL, T3FREE, THYROIDAB in the last 72 hours.  Invalid input(s): FREET3 Anemia work up Recent Labs    05/07/20 2244  VITAMINB12 564  FOLATE 8.9  FERRITIN 358*  TIBC 192*  IRON 12*  RETICCTPCT 1.0   Urinalysis    Component Value Date/Time   COLORURINE YELLOW 05/08/2020 0639   APPEARANCEUR HAZY (A) 05/08/2020 0639   LABSPEC 1.012 05/08/2020 0639   PHURINE 5.0 05/08/2020 0639   GLUCOSEU NEGATIVE 05/08/2020 0639   HGBUR NEGATIVE 05/08/2020 [place] NEGATIVE 05/08/2020 0639   KETONESUR NEGATIVE 05/08/2020 0639   PROTEINUR >=300 (A) 05/08/2020 0639   UROBILINOGEN 0.2 07/16/2014 1747   NITRITE NEGATIVE 05/08/2020 0639   LEUKOCYTESUR NEGATIVE 05/08/2020 0639   Sepsis Labs Invalid input(s): PROCALCITONIN,  WBC,  LACTICIDVEN Microbiology Recent Results (from the past 240 hour(s))  Respiratory Panel by RT PCR (Flu A&B, Covid) - Nasopharyngeal Swab     Status: None   Collection Time: 05/07/20  8:24 PM   Specimen: Nasopharyngeal Swab  Result Value Ref Range Status   SARS Coronavirus 2 by RT PCR NEGATIVE NEGATIVE Final    Comment: (NOTE) SARS-CoV-2 target nucleic acids are NOT DETECTED.  The SARS-CoV-2 RNA is generally detectable in upper respiratoy specimens during the acute phase of infection. The lowest concentration of SARS-CoV-2 viral copies this assay can detect is 131 copies/mL. A negative result does not preclude SARS-Cov-2 infection and should not be used as the sole basis for treatment or other patient management decisions. A negative result may occur with  improper specimen collection/handling, submission of specimen other than nasopharyngeal swab, presence of viral mutation(s) within the areas targeted by this assay, and inadequate number of viral copies (<131 copies/mL). A negative result must be combined with  clinical observations, patient history, and epidemiological information. The expected result is Negative.  Fact Sheet for Patients:  PinkCheek.be  Fact Sheet for Healthcare Providers:  GravelBags.it  This test is no t yet approved or cleared by the [place] FDA and  has been authorized for detection and/or diagnosis of SARS-CoV-2 by FDA under an Emergency Use Authorization (EUA). This EUA will remain  in effect (meaning this test can be used) for the duration of the COVID-19 declaration under Section 564(b)(1)  of the Act, 21 U.S.C. section 360bbb-3(b)(1), unless the authorization is terminated or revoked sooner.     Influenza A by PCR NEGATIVE NEGATIVE Final   Influenza B by PCR NEGATIVE NEGATIVE Final    Comment: (NOTE) The Xpert Xpress SARS-CoV-2/FLU/RSV assay is intended as an aid in  the diagnosis of influenza from Nasopharyngeal swab specimens and  should not be used as a sole basis for treatment. Nasal washings and  aspirates are unacceptable for Xpert Xpress SARS-CoV-2/FLU/RSV  testing.  Fact Sheet for Patients: PinkCheek.be  Fact Sheet for Healthcare Providers: GravelBags.it  This test is not yet approved or cleared by the [place] FDA and  has been authorized for detection and/or diagnosis of SARS-CoV-2 by  FDA under an Emergency Use Authorization (EUA). This EUA will remain  in effect (meaning this test can be used) for the duration of the  Covid-19 declaration under Section 564(b)(1) of the Act, 21  U.S.C. section 360bbb-3(b)(1), unless the authorization is  terminated or revoked. Performed at [facility], [place] [address], [place], [place] [address]      Time coordinating discharge:  I have spent 35 minutes face to face with the patient and on the ward discussing the patients care, assessment, plan and disposition  with other care givers. >50% of the time was devoted counseling the patient about the risks and benefits of treatment/Discharge disposition and coordinating care.   SIGNED:   Damita Lack, MD  [facility] 05/10/2020, 12:50 PM   If 7PM-7AM, please contact night-coverage

## 2020-05-11 LAB — PARATHYROID HORMONE, INTACT (NO CA): PTH: 77 pg/mL — ABNORMAL HIGH (ref 15–65)

## 2020-05-11 LAB — C3 COMPLEMENT: C3 Complement: 154 mg/dL (ref 82–167)

## 2020-05-11 LAB — C4 COMPLEMENT: Complement C4, Body Fluid: 33 mg/dL (ref 12–38)

## 2020-05-22 ENCOUNTER — Other Ambulatory Visit: Payer: Self-pay

## 2020-05-22 DIAGNOSIS — N184 Chronic kidney disease, stage 4 (severe): Secondary | ICD-10-CM

## 2020-05-22 DIAGNOSIS — N179 Acute kidney failure, unspecified: Secondary | ICD-10-CM

## 2020-06-04 ENCOUNTER — Other Ambulatory Visit (HOSPITAL_COMMUNITY): Payer: BC Managed Care – PPO

## 2020-06-04 ENCOUNTER — Encounter: Payer: BC Managed Care – PPO | Admitting: Vascular Surgery

## 2020-06-04 ENCOUNTER — Encounter (HOSPITAL_COMMUNITY): Payer: BC Managed Care – PPO

## 2020-08-06 ENCOUNTER — Encounter (HOSPITAL_COMMUNITY): Payer: Self-pay

## 2020-08-06 ENCOUNTER — Inpatient Hospital Stay (HOSPITAL_COMMUNITY)
Admission: EM | Admit: 2020-08-06 | Discharge: 2020-08-13 | DRG: 628 | Disposition: A | Discharge status: Home / Self Care | Payer: Medicaid Other | Attending: Internal Medicine | Admitting: Internal Medicine

## 2020-08-06 ENCOUNTER — Other Ambulatory Visit: Payer: Self-pay

## 2020-08-06 DIAGNOSIS — I120 Hypertensive chronic kidney disease with stage 5 chronic kidney disease or end stage renal disease: Secondary | ICD-10-CM | POA: Diagnosis present

## 2020-08-06 DIAGNOSIS — I160 Hypertensive urgency: Secondary | ICD-10-CM | POA: Diagnosis present

## 2020-08-06 DIAGNOSIS — R339 Retention of urine, unspecified: Secondary | ICD-10-CM | POA: Diagnosis not present

## 2020-08-06 DIAGNOSIS — Z888 Allergy status to other drugs, medicaments and biological substances status: Secondary | ICD-10-CM

## 2020-08-06 DIAGNOSIS — R0602 Shortness of breath: Secondary | ICD-10-CM

## 2020-08-06 DIAGNOSIS — R079 Chest pain, unspecified: Secondary | ICD-10-CM

## 2020-08-06 DIAGNOSIS — D631 Anemia in chronic kidney disease: Secondary | ICD-10-CM | POA: Diagnosis present

## 2020-08-06 DIAGNOSIS — E8770 Fluid overload, unspecified: Principal | ICD-10-CM

## 2020-08-06 DIAGNOSIS — Z823 Family history of stroke: Secondary | ICD-10-CM

## 2020-08-06 DIAGNOSIS — E872 Acidosis, unspecified: Secondary | ICD-10-CM | POA: Diagnosis present

## 2020-08-06 DIAGNOSIS — E109 Type 1 diabetes mellitus without complications: Secondary | ICD-10-CM

## 2020-08-06 DIAGNOSIS — Z8249 Family history of ischemic heart disease and other diseases of the circulatory system: Secondary | ICD-10-CM

## 2020-08-06 DIAGNOSIS — Z833 Family history of diabetes mellitus: Secondary | ICD-10-CM

## 2020-08-06 DIAGNOSIS — E785 Hyperlipidemia, unspecified: Secondary | ICD-10-CM | POA: Diagnosis present

## 2020-08-06 DIAGNOSIS — Z809 Family history of malignant neoplasm, unspecified: Secondary | ICD-10-CM

## 2020-08-06 DIAGNOSIS — E1122 Type 2 diabetes mellitus with diabetic chronic kidney disease: Secondary | ICD-10-CM

## 2020-08-06 DIAGNOSIS — E7800 Pure hypercholesterolemia, unspecified: Secondary | ICD-10-CM | POA: Diagnosis present

## 2020-08-06 DIAGNOSIS — Z992 Dependence on renal dialysis: Secondary | ICD-10-CM

## 2020-08-06 DIAGNOSIS — Z20822 Contact with and (suspected) exposure to covid-19: Secondary | ICD-10-CM | POA: Diagnosis present

## 2020-08-06 DIAGNOSIS — Z6833 Body mass index (BMI) 33.0-33.9, adult: Secondary | ICD-10-CM

## 2020-08-06 DIAGNOSIS — G2581 Restless legs syndrome: Secondary | ICD-10-CM | POA: Diagnosis present

## 2020-08-06 DIAGNOSIS — E875 Hyperkalemia: Secondary | ICD-10-CM | POA: Diagnosis present

## 2020-08-06 DIAGNOSIS — N19 Unspecified kidney failure: Secondary | ICD-10-CM | POA: Diagnosis present

## 2020-08-06 DIAGNOSIS — E119 Type 2 diabetes mellitus without complications: Secondary | ICD-10-CM

## 2020-08-06 DIAGNOSIS — I509 Heart failure, unspecified: Secondary | ICD-10-CM

## 2020-08-06 DIAGNOSIS — N189 Chronic kidney disease, unspecified: Secondary | ICD-10-CM | POA: Diagnosis present

## 2020-08-06 DIAGNOSIS — N289 Disorder of kidney and ureter, unspecified: Secondary | ICD-10-CM

## 2020-08-06 DIAGNOSIS — Q631 Lobulated, fused and horseshoe kidney: Secondary | ICD-10-CM

## 2020-08-06 DIAGNOSIS — N186 End stage renal disease: Secondary | ICD-10-CM | POA: Diagnosis present

## 2020-08-06 LAB — COMPREHENSIVE METABOLIC PANEL
ALT: 39 U/L (ref 0–44)
AST: 20 U/L (ref 15–41)
Albumin: 2.3 g/dL — ABNORMAL LOW (ref 3.5–5.0)
Alkaline Phosphatase: 113 U/L (ref 38–126)
Anion gap: 12 (ref 5–15)
BUN: 89 mg/dL — ABNORMAL HIGH (ref 6–20)
CO2: 18 mmol/L — ABNORMAL LOW (ref 22–32)
Calcium: 7.4 mg/dL — ABNORMAL LOW (ref 8.9–10.3)
Chloride: 108 mmol/L (ref 98–111)
Creatinine, Ser: 9.9 mg/dL — ABNORMAL HIGH (ref 0.61–1.24)
GFR, Estimated: 6 mL/min — ABNORMAL LOW (ref >60)
Glucose, Bld: 109 mg/dL — ABNORMAL HIGH (ref 70–99)
Potassium: 5.2 mmol/L — ABNORMAL HIGH (ref 3.5–5.1)
Sodium: 138 mmol/L (ref 135–145)
Total Bilirubin: 0.5 mg/dL (ref 0.3–1.2)
Total Protein: 6 g/dL — ABNORMAL LOW (ref 6.5–8.1)

## 2020-08-06 LAB — CBC
HCT: 22.2 % — ABNORMAL LOW (ref 39.0–52.0)
Hemoglobin: 7 g/dL — ABNORMAL LOW (ref 13.0–17.0)
MCH: 26.4 pg (ref 26.0–34.0)
MCHC: 31.5 g/dL (ref 30.0–36.0)
MCV: 83.8 fL (ref 80.0–100.0)
Platelets: 320 10*3/uL (ref 150–400)
RBC: 2.65 MIL/uL — ABNORMAL LOW (ref 4.22–5.81)
RDW: 16.4 % — ABNORMAL HIGH (ref 11.5–15.5)
WBC: 5.9 10*3/uL (ref 4.0–10.5)
nRBC: 0 % (ref 0.0–0.2)

## 2020-08-06 LAB — LIPASE, BLOOD: Lipase: 23 U/L (ref 11–51)

## 2020-08-06 LAB — SARS CORONAVIRUS 2 BY RT PCR (HOSPITAL ORDER, PERFORMED IN ~~LOC~~ HOSPITAL LAB): SARS Coronavirus 2: NEGATIVE

## 2020-08-06 LAB — BRAIN NATRIURETIC PEPTIDE: B Natriuretic Peptide: 2067.3 pg/mL — ABNORMAL HIGH (ref 0.0–100.0)

## 2020-08-06 NOTE — ED Triage Notes (Signed)
Pt reports that he has leg swelling that started last night, SOB, nausea, cold sweats, nausea and diarrhea

## 2020-08-07 ENCOUNTER — Inpatient Hospital Stay (HOSPITAL_COMMUNITY): Payer: Medicaid Other

## 2020-08-07 ENCOUNTER — Other Ambulatory Visit: Payer: Self-pay

## 2020-08-07 ENCOUNTER — Emergency Department (HOSPITAL_COMMUNITY): Payer: Medicaid Other

## 2020-08-07 ENCOUNTER — Encounter (HOSPITAL_COMMUNITY): Payer: Self-pay | Admitting: Family Medicine

## 2020-08-07 DIAGNOSIS — R339 Retention of urine, unspecified: Secondary | ICD-10-CM | POA: Diagnosis not present

## 2020-08-07 DIAGNOSIS — E875 Hyperkalemia: Secondary | ICD-10-CM | POA: Diagnosis present

## 2020-08-07 DIAGNOSIS — N289 Disorder of kidney and ureter, unspecified: Secondary | ICD-10-CM

## 2020-08-07 DIAGNOSIS — N19 Unspecified kidney failure: Secondary | ICD-10-CM

## 2020-08-07 DIAGNOSIS — N185 Chronic kidney disease, stage 5: Secondary | ICD-10-CM

## 2020-08-07 DIAGNOSIS — Z8249 Family history of ischemic heart disease and other diseases of the circulatory system: Secondary | ICD-10-CM | POA: Diagnosis not present

## 2020-08-07 DIAGNOSIS — E7800 Pure hypercholesterolemia, unspecified: Secondary | ICD-10-CM | POA: Diagnosis present

## 2020-08-07 DIAGNOSIS — N186 End stage renal disease: Secondary | ICD-10-CM | POA: Diagnosis present

## 2020-08-07 DIAGNOSIS — E872 Acidosis, unspecified: Secondary | ICD-10-CM | POA: Diagnosis present

## 2020-08-07 DIAGNOSIS — D631 Anemia in chronic kidney disease: Secondary | ICD-10-CM

## 2020-08-07 DIAGNOSIS — Z888 Allergy status to other drugs, medicaments and biological substances status: Secondary | ICD-10-CM | POA: Diagnosis not present

## 2020-08-07 DIAGNOSIS — I120 Hypertensive chronic kidney disease with stage 5 chronic kidney disease or end stage renal disease: Secondary | ICD-10-CM | POA: Diagnosis present

## 2020-08-07 DIAGNOSIS — E8770 Fluid overload, unspecified: Secondary | ICD-10-CM | POA: Diagnosis present

## 2020-08-07 DIAGNOSIS — Z20822 Contact with and (suspected) exposure to covid-19: Secondary | ICD-10-CM | POA: Diagnosis present

## 2020-08-07 DIAGNOSIS — Z809 Family history of malignant neoplasm, unspecified: Secondary | ICD-10-CM | POA: Diagnosis not present

## 2020-08-07 DIAGNOSIS — R0602 Shortness of breath: Secondary | ICD-10-CM | POA: Diagnosis present

## 2020-08-07 DIAGNOSIS — Z823 Family history of stroke: Secondary | ICD-10-CM | POA: Diagnosis not present

## 2020-08-07 DIAGNOSIS — Z794 Long term (current) use of insulin: Secondary | ICD-10-CM

## 2020-08-07 DIAGNOSIS — Z992 Dependence on renal dialysis: Secondary | ICD-10-CM | POA: Diagnosis not present

## 2020-08-07 DIAGNOSIS — G2581 Restless legs syndrome: Secondary | ICD-10-CM | POA: Diagnosis present

## 2020-08-07 DIAGNOSIS — E785 Hyperlipidemia, unspecified: Secondary | ICD-10-CM | POA: Diagnosis present

## 2020-08-07 DIAGNOSIS — Q631 Lobulated, fused and horseshoe kidney: Secondary | ICD-10-CM | POA: Diagnosis not present

## 2020-08-07 DIAGNOSIS — E1122 Type 2 diabetes mellitus with diabetic chronic kidney disease: Secondary | ICD-10-CM | POA: Diagnosis present

## 2020-08-07 DIAGNOSIS — I160 Hypertensive urgency: Secondary | ICD-10-CM

## 2020-08-07 DIAGNOSIS — Z6833 Body mass index (BMI) 33.0-33.9, adult: Secondary | ICD-10-CM | POA: Diagnosis not present

## 2020-08-07 DIAGNOSIS — Z833 Family history of diabetes mellitus: Secondary | ICD-10-CM | POA: Diagnosis not present

## 2020-08-07 HISTORY — PX: IR US GUIDE VASC ACCESS RIGHT: IMG2390

## 2020-08-07 HISTORY — PX: IR PERC TUN PERIT CATH WO PORT S&I /IMAG: IMG2327

## 2020-08-07 LAB — BASIC METABOLIC PANEL
Anion gap: 14 (ref 5–15)
BUN: 91 mg/dL — ABNORMAL HIGH (ref 6–20)
CO2: 15 mmol/L — ABNORMAL LOW (ref 22–32)
Calcium: 7.6 mg/dL — ABNORMAL LOW (ref 8.9–10.3)
Chloride: 107 mmol/L (ref 98–111)
Creatinine, Ser: 9.77 mg/dL — ABNORMAL HIGH (ref 0.61–1.24)
GFR, Estimated: 7 mL/min — ABNORMAL LOW (ref >60)
Glucose, Bld: 151 mg/dL — ABNORMAL HIGH (ref 70–99)
Potassium: 4.9 mmol/L (ref 3.5–5.1)
Sodium: 136 mmol/L (ref 135–145)

## 2020-08-07 LAB — URINALYSIS, COMPLETE (UACMP) WITH MICROSCOPIC
Bilirubin Urine: NEGATIVE
Glucose, UA: NEGATIVE mg/dL
Ketones, ur: NEGATIVE mg/dL
Leukocytes,Ua: NEGATIVE
Nitrite: NEGATIVE
Protein, ur: >=300 mg/dL — AB
Specific Gravity, Urine: 1.012 (ref 1.005–1.030)
pH: 5 (ref 5.0–8.0)

## 2020-08-07 LAB — CBC
HCT: 25.9 % — ABNORMAL LOW (ref 39.0–52.0)
Hemoglobin: 7.8 g/dL — ABNORMAL LOW (ref 13.0–17.0)
MCH: 26 pg (ref 26.0–34.0)
MCHC: 30.1 g/dL (ref 30.0–36.0)
MCV: 86.3 fL (ref 80.0–100.0)
Platelets: 336 10*3/uL (ref 150–400)
RBC: 3 MIL/uL — ABNORMAL LOW (ref 4.22–5.81)
RDW: 16.4 % — ABNORMAL HIGH (ref 11.5–15.5)
WBC: 6.5 10*3/uL (ref 4.0–10.5)
nRBC: 0 % (ref 0.0–0.2)

## 2020-08-07 LAB — CBG MONITORING, ED
Glucose-Capillary: 143 mg/dL — ABNORMAL HIGH (ref 70–99)
Glucose-Capillary: 83 mg/dL (ref 70–99)
Glucose-Capillary: 82 mg/dL (ref 70–99)
Glucose-Capillary: 100 mg/dL — ABNORMAL HIGH (ref 70–99)

## 2020-08-07 LAB — PHOSPHORUS
Phosphorus: 6.5 mg/dL — ABNORMAL HIGH (ref 2.5–4.6)
Phosphorus: 7 mg/dL — ABNORMAL HIGH (ref 2.5–4.6)

## 2020-08-07 LAB — HEMOGLOBIN A1C
Hgb A1c MFr Bld: 6 % — ABNORMAL HIGH (ref 4.8–5.6)
Mean Plasma Glucose: 125.5 mg/dL

## 2020-08-07 LAB — GLUCOSE, CAPILLARY
Glucose-Capillary: 148 mg/dL — ABNORMAL HIGH (ref 70–99)
Glucose-Capillary: 94 mg/dL (ref 70–99)

## 2020-08-07 LAB — MAGNESIUM: Magnesium: 1.8 mg/dL (ref 1.7–2.4)

## 2020-08-07 LAB — CREATININE, URINE, RANDOM: Creatinine, Urine: 85.89 mg/dL

## 2020-08-07 LAB — SODIUM, URINE, RANDOM: Sodium, Ur: 49 mmol/L

## 2020-08-07 MED ORDER — HEPARIN SODIUM (PORCINE) 5000 UNIT/ML IJ SOLN
5000.0000 [IU] | Freq: Three times a day (TID) | INTRAMUSCULAR | Status: DC
  Administered 2020-08-07 – 2020-08-11 (×13): 5000 [IU] via SUBCUTANEOUS
  Filled 2020-08-07 (×13): qty 1

## 2020-08-07 MED ORDER — MIDAZOLAM HCL 2 MG/2ML IJ SOLN
INTRAMUSCULAR | Status: AC
  Filled 2020-08-07: qty 2

## 2020-08-07 MED ORDER — CARVEDILOL 6.25 MG PO TABS
6.2500 mg | ORAL_TABLET | Freq: Two times a day (BID) | ORAL | Status: DC
  Administered 2020-08-07 – 2020-08-13 (×13): 6.25 mg via ORAL
  Filled 2020-08-07 (×11): qty 1

## 2020-08-07 MED ORDER — LABETALOL HCL 5 MG/ML IV SOLN
10.0000 mg | INTRAVENOUS | Status: DC | PRN
  Administered 2020-08-07 – 2020-08-12 (×7): 10 mg via INTRAVENOUS
  Filled 2020-08-07 (×8): qty 4

## 2020-08-07 MED ORDER — MIDAZOLAM HCL 2 MG/2ML IJ SOLN
INTRAMUSCULAR | Status: AC | PRN
  Administered 2020-08-07 (×2): 1 mg via INTRAVENOUS

## 2020-08-07 MED ORDER — ACETAMINOPHEN 325 MG PO TABS
650.0000 mg | ORAL_TABLET | Freq: Four times a day (QID) | ORAL | Status: DC | PRN
  Administered 2020-08-09: 650 mg via ORAL
  Filled 2020-08-07: qty 2

## 2020-08-07 MED ORDER — CEFAZOLIN SODIUM-DEXTROSE 2-4 GM/100ML-% IV SOLN
2.0000 g | INTRAVENOUS | Status: AC
  Filled 2020-08-07: qty 100

## 2020-08-07 MED ORDER — SODIUM BICARBONATE 650 MG PO TABS
650.0000 mg | ORAL_TABLET | Freq: Three times a day (TID) | ORAL | Status: DC
  Administered 2020-08-07 – 2020-08-10 (×10): 650 mg via ORAL
  Filled 2020-08-07 (×11): qty 1

## 2020-08-07 MED ORDER — FENTANYL CITRATE (PF) 100 MCG/2ML IJ SOLN
INTRAMUSCULAR | Status: AC | PRN
  Administered 2020-08-07: 50 ug via INTRAVENOUS

## 2020-08-07 MED ORDER — FUROSEMIDE 10 MG/ML IJ SOLN
120.0000 mg | Freq: Two times a day (BID) | INTRAVENOUS | Status: DC
  Administered 2020-08-07: 120 mg via INTRAVENOUS
  Filled 2020-08-07: qty 10
  Filled 2020-08-07 (×2): qty 12

## 2020-08-07 MED ORDER — CEFAZOLIN SODIUM-DEXTROSE 2-4 GM/100ML-% IV SOLN
INTRAVENOUS | Status: AC
  Administered 2020-08-07: 2 g via INTRAVENOUS
  Filled 2020-08-07: qty 100

## 2020-08-07 MED ORDER — HEPARIN SODIUM (PORCINE) 1000 UNIT/ML IJ SOLN
INTRAMUSCULAR | Status: AC
  Filled 2020-08-07: qty 1

## 2020-08-07 MED ORDER — SODIUM BICARBONATE 650 MG PO TABS: 1300.0000 mg | ORAL_TABLET | Freq: Two times a day (BID) | ORAL | Status: DC

## 2020-08-07 MED ORDER — ONDANSETRON HCL 4 MG PO TABS: 4.0000 mg | ORAL_TABLET | Freq: Four times a day (QID) | ORAL | Status: DC | PRN

## 2020-08-07 MED ORDER — FENTANYL CITRATE (PF) 100 MCG/2ML IJ SOLN
INTRAMUSCULAR | Status: AC
  Filled 2020-08-07: qty 2

## 2020-08-07 MED ORDER — ACETAMINOPHEN 650 MG RE SUPP: 650.0000 mg | Freq: Four times a day (QID) | RECTAL | Status: DC | PRN

## 2020-08-07 MED ORDER — CHLORHEXIDINE GLUCONATE CLOTH 2 % EX PADS
6.0000 | MEDICATED_PAD | Freq: Every day | CUTANEOUS | Status: DC
  Administered 2020-08-08 – 2020-08-13 (×6): 6 via TOPICAL

## 2020-08-07 MED ORDER — INSULIN ASPART 100 UNIT/ML ~~LOC~~ SOLN: 0.0000 [IU] | SUBCUTANEOUS | Status: DC

## 2020-08-07 MED ORDER — CARVEDILOL 12.5 MG PO TABS
6.2500 mg | ORAL_TABLET | Freq: Two times a day (BID) | ORAL | Status: DC
  Filled 2020-08-07: qty 1

## 2020-08-07 MED ORDER — FUROSEMIDE 10 MG/ML IJ SOLN
60.0000 mg | Freq: Two times a day (BID) | INTRAMUSCULAR | Status: DC
  Administered 2020-08-07: 60 mg via INTRAVENOUS
  Filled 2020-08-07: qty 6

## 2020-08-07 MED ORDER — ONDANSETRON HCL 4 MG/2ML IJ SOLN
4.0000 mg | Freq: Four times a day (QID) | INTRAMUSCULAR | Status: DC | PRN
  Administered 2020-08-07 – 2020-08-13 (×2): 4 mg via INTRAVENOUS
  Filled 2020-08-07 (×2): qty 2

## 2020-08-07 MED ORDER — LIDOCAINE HCL 1 % IJ SOLN
INTRAMUSCULAR | Status: AC
  Filled 2020-08-07: qty 20

## 2020-08-07 MED ORDER — LIDOCAINE HCL 1 % IJ SOLN
INTRAMUSCULAR | Status: AC | PRN
  Administered 2020-08-07: 15 mL

## 2020-08-07 NOTE — Sedation Documentation (Signed)
Vital signs stable. 

## 2020-08-07 NOTE — ED Notes (Signed)
Patient transported to X-ray 

## 2020-08-07 NOTE — ED Notes (Signed)
Attempted to call report x 1  

## 2020-08-07 NOTE — ED Notes (Signed)
Attempted to call report x2

## 2020-08-07 NOTE — Sedation Documentation (Signed)
Patient is resting comfortably. 

## 2020-08-07 NOTE — Consult Note (Signed)
[facility]  INPATIENT CONSULTATION  Reason for Consultation: CKD Requesting Provider: Dr. Lupita Leash  HPI: [name] is an 35 y.o. male with HTN, CKD, horseshoe kidney, DM who is seen for evaluation and management of CKD and volume overload.   Hospitalized 04/2020 similar symptoms, Cr ~4, planned outpt nephrology follow up.  Pt unable to keep appts.  Presented last PM with LE edema, nausea, dyspnea. Hypertensive, edematous. Labs showing BUN 91, Cr 9.7, Bicarb 15, K 4.9.  Being given IV lasix for edema.  Nephrology consulted.   Pt with some dysgeusia and variable appetite, lethargy, progressive edema.  We discussed progressive CKD and I recommended initiation of HD for which he is agreeable.    PMH: Past Medical History:  Diagnosis Date  . Diabetes mellitus   . High cholesterol   . Hypertension    PSH: Past Surgical History:  Procedure Laterality Date  . FRACTURE SURGERY    . I & D EXTREMITY Left 07/16/2014   Procedure: IRRIGATION AND DEBRIDEMENT EXTREMITY/LEFT INDEX FINGER;  Surgeon: Leanora Cover, MD;  Location: [place];  Service: Orthopedics;  Laterality: Left;    Past Medical History:  Diagnosis Date  . Diabetes mellitus   . High cholesterol   . Hypertension     Medications:  I have reviewed the patient's current medications.  (Not in a hospital admission)   ALLERGIES:   Allergies  Allergen Reactions  . Amlodipine Nausea And Vomiting and Other (See Comments)    Patient was taking Amlodipine and Hydralazine at the same time, so the reactions came from one of the 2: Lethargy and an all-over feeling of NOT feeling well (also)  . Hydralazine Nausea And Vomiting and Other (See Comments)    Patient was taking Hydralazine AND Amlodipine at the same time, so the reactions came from one of the 2: Lethargy and an all-over feeling of NOT feeling well (also)    FAM HX: Family History  Problem Relation Age of Onset  . Cancer Mother   . Stroke Father   . Diabetes  Father   . Hypertension Father     Social History:   reports that he has never smoked. He has never used smokeless tobacco. He reports current alcohol use. He reports that he does not use drugs.  ROS: 12 system ROS neg except per HPI  Blood pressure (!) 152/99, pulse 82, temperature 98.1 F (36.7 C), temperature source Oral, resp. rate (!) 22, SpO2 100 %. PHYSICAL EXAM: Gen: non toxic on stretcher  Eyes:  anicteric ENT: MMM Neck: supple CV:  RRR, no rub Abd:  Soft, nontender Lungs: normal WOB, clear ant GU: no foley Extr:  3+ pitting edema to waist Neuro: tremor but no frank asterixis    Results for orders placed or performed during the hospital encounter of 08/06/20 (from the past 48 hour(s))  SARS Coronavirus 2 by RT PCR (hospital order, performed in [place] hospital lab) Nasopharyngeal Nasopharyngeal Swab     Status: None   Collection Time: 08/06/20  8:08 PM   Specimen: Nasopharyngeal Swab  Result Value Ref Range   SARS Coronavirus 2 NEGATIVE NEGATIVE    Comment: (NOTE) SARS-CoV-2 target nucleic acids are NOT DETECTED.  The SARS-CoV-2 RNA is generally detectable in upper and lower respiratory specimens during the acute phase of infection. The lowest concentration of SARS-CoV-2 viral copies this assay can detect is 250 copies / mL. A negative result does not preclude SARS-CoV-2 infection and should not be used as the  sole basis for treatment or other patient management decisions.  A negative result may occur with improper specimen collection / handling, submission of specimen other than nasopharyngeal swab, presence of viral mutation(s) within the areas targeted by this assay, and inadequate number of viral copies (<250 copies / mL). A negative result must be combined with clinical observations, patient history, and epidemiological information.  Fact Sheet for Patients:   StrictlyIdeas.no  Fact Sheet for Healthcare  Providers: BankingDealers.co.za  This test is not yet approved or  cleared by the [place] FDA and has been authorized for detection and/or diagnosis of SARS-CoV-2 by FDA under an Emergency Use Authorization (EUA).  This EUA will remain in effect (meaning this test can be used) for the duration of the COVID-19 declaration under Section 564(b)(1) of the Act, 21 U.S.C. section 360bbb-3(b)(1), unless the authorization is terminated or revoked sooner.  Performed at [facility], [place] [address], [place], [place] [address]   Lipase, blood     Status: None   Collection Time: 08/06/20  8:12 PM  Result Value Ref Range   Lipase 23 11 - 51 U/L    Comment: Performed at [facility] [address], [place], [place] [address]  Comprehensive metabolic panel     Status: Abnormal   Collection Time: 08/06/20  8:12 PM  Result Value Ref Range   Sodium 138 135 - 145 mmol/L   Potassium 5.2 (H) 3.5 - 5.1 mmol/L   Chloride 108 98 - 111 mmol/L   CO2 18 (L) 22 - 32 mmol/L   Glucose, Bld 109 (H) 70 - 99 mg/dL    Comment: Glucose reference range applies only to samples taken after fasting for at least 8 hours.   BUN 89 (H) 6 - 20 mg/dL   Creatinine, Ser 9.90 (H) 0.61 - 1.24 mg/dL   Calcium 7.4 (L) 8.9 - 10.3 mg/dL   Total Protein 6.0 (L) 6.5 - 8.1 g/dL   Albumin 2.3 (L) 3.5 - 5.0 g/dL   AST 20 15 - 41 U/L   ALT 39 0 - 44 U/L   Alkaline Phosphatase 113 38 - 126 U/L   Total Bilirubin 0.5 0.3 - 1.2 mg/dL   GFR, Estimated 6 (L) >60 mL/min    Comment: (NOTE) Calculated using the CKD-EPI Creatinine Equation (2021)    Anion gap 12 5 - 15    Comment: Performed at [facility] [address] [place], [place] [address]  CBC     Status: Abnormal   Collection Time: 08/06/20  8:12 PM  Result Value Ref Range   WBC 5.9 4.0 - 10.5 K/uL   RBC 2.65 (L) 4.22 - 5.81 MIL/uL   Hemoglobin 7.0 (L) 13.0 - 17.0 g/dL   HCT 22.2 (L) 39.0 - 52.0 %   MCV 83.8 80.0 - 100.0 fL    MCH 26.4 26.0 - 34.0 pg   MCHC 31.5 30.0 - 36.0 g/dL   RDW 16.4 (H) 11.5 - 15.5 %   Platelets 320 150 - 400 K/uL   nRBC 0.0 0.0 - 0.2 %    Comment: Performed at [facility] [address] [place], [place] [address]  Brain natriuretic peptide     Status: Abnormal   Collection Time: 08/06/20  8:12 PM  Result Value Ref Range   B Natriuretic Peptide 2,067.3 (H) 0.0 - 100.0 pg/mL    Comment: Performed at [facility] [address], [place], [place] [address]  Urinalysis, Complete w Microscopic Urine, Clean Catch     Status: Abnormal   Collection Time: 08/07/20  2:02 AM  Result Value Ref Range   Color, Urine YELLOW YELLOW   APPearance CLOUDY (A) CLEAR   Specific Gravity, Urine 1.012 1.005 - 1.030   pH 5.0 5.0 - 8.0   Glucose, UA NEGATIVE NEGATIVE mg/dL   Hgb urine dipstick MODERATE (A) NEGATIVE   Bilirubin Urine NEGATIVE NEGATIVE   Ketones, ur NEGATIVE NEGATIVE mg/dL   Protein, ur >=300 (A) NEGATIVE mg/dL   Nitrite NEGATIVE NEGATIVE   Leukocytes,Ua NEGATIVE NEGATIVE   RBC / HPF 0-5 0 - 5 RBC/hpf   WBC, UA 11-20 0 - 5 WBC/hpf   Bacteria, UA RARE (A) NONE SEEN   Squamous Epithelial / LPF 0-5 0 - 5   Mucus PRESENT     Comment: Performed at [facility], [address], [place], [place] [address]  Sodium, urine, random     Status: None   Collection Time: 08/07/20  2:20 AM  Result Value Ref Range   Sodium, Ur 49 mmol/L    Comment: Performed at [facility] [address], [place], [place] [address]  Creatinine, urine, random     Status: None   Collection Time: 08/07/20  2:20 AM  Result Value Ref Range   Creatinine, Urine 85.89 mg/dL    Comment: Performed at [facility] [address] [place], [place] [address]  CBG monitoring, ED     Status: Abnormal   Collection Time: 08/07/20  2:43 AM  Result Value Ref Range   Glucose-Capillary 143 (H) 70 - 99 mg/dL    Comment: Glucose reference range applies only to samples taken after fasting for at least 8  hours.  Type and screen [place]     Status: None   Collection Time: 08/07/20  2:44 AM  Result Value Ref Range   ABO/RH(D) O POS    Antibody Screen NEG    Sample Expiration      08/10/2020,2359 Performed at [facility], [place] [address], [place], [place] [id number]   Basic metabolic panel     Status: Abnormal   Collection Time: 08/07/20  2:58 AM  Result Value Ref Range   Sodium 136 135 - 145 mmol/L   Potassium 4.9 3.5 - 5.1 mmol/L   Chloride 107 98 - 111 mmol/L   CO2 15 (L) 22 - 32 mmol/L   Glucose, Bld 151 (H) 70 - 99 mg/dL    Comment: Glucose reference range applies only to samples taken after fasting for at least 8 hours.   BUN 91 (H) 6 - 20 mg/dL   Creatinine, Ser 9.77 (H) 0.61 - 1.24 mg/dL   Calcium 7.6 (L) 8.9 - 10.3 mg/dL   GFR, Estimated 7 (L) >60 mL/min    Comment: (NOTE) Calculated using the CKD-EPI Creatinine Equation (2021)    Anion gap 14 5 - 15    Comment: Performed at [facility] [address], [place], [place] [address]  Magnesium     Status: None   Collection Time: 08/07/20  2:58 AM  Result Value Ref Range   Magnesium 1.8 1.7 - 2.4 mg/dL    Comment: Performed at [facility] [address], [place], [place] [address]  Phosphorus     Status: Abnormal   Collection Time: 08/07/20  2:58 AM  Result Value Ref Range   Phosphorus 6.5 (H) 2.5 - 4.6  mg/dL    Comment: Performed at [facility], [place] [address], [place], [place] [address]  CBC     Status: Abnormal   Collection Time: 08/07/20  2:58 AM  Result Value Ref Range   WBC 6.5 4.0 - 10.5 K/uL   RBC 3.00 (L) 4.22 - 5.81 MIL/uL   Hemoglobin 7.8 (L) 13.0 - 17.0 g/dL   HCT 25.9 (L) 39.0 - 52.0 %   MCV 86.3 80.0 - 100.0 fL   MCH 26.0 26.0 - 34.0 pg   MCHC 30.1 30.0 - 36.0 g/dL   RDW 16.4 (H) 11.5 - 15.5 %   Platelets 336 150 - 400 K/uL   nRBC 0.0 0.0 - 0.2 %    Comment: Performed at [facility], [place] [address], [place], [place] [address]  Hemoglobin A1c      Status: Abnormal   Collection Time: 08/07/20  2:58 AM  Result Value Ref Range   Hgb A1c MFr Bld 6.0 (H) 4.8 - 5.6 %    Comment: (NOTE) Pre diabetes:          5.7%-6.4%  Diabetes:              >6.4%  Glycemic control for   <7.0% adults with diabetes    Mean Plasma Glucose 125.5 mg/dL    Comment: Performed at [facility] [address], [place], [place] [address]  CBG monitoring, ED     Status: None   Collection Time: 08/07/20  7:47 AM  Result Value Ref Range   Glucose-Capillary 83 70 - 99 mg/dL    Comment: Glucose reference range applies only to samples taken after fasting for at least 8 hours.   Comment 1 Notify RN    Comment 2 Document in Chart     DG Chest 2 View  Result Date: 08/07/2020 CLINICAL DATA:  Leg swelling EXAM: CHEST - 2 VIEW COMPARISON:  March 10, 2020 FINDINGS: Again noted is significant cardiomegaly which appears to have increased from prior study. The bronchi are splayed as before. Again noted is vascular congestion with developing interstitial edema. There are small bilateral pleural effusions. No pneumothorax. IMPRESSION: 1. Worsening cardiomegaly. An underlying pericardial effusion is not excluded. 2. Vascular congestion with interstitial edema and small bilateral pleural effusions. Electronically Signed   By: Constance Holster M.D.   On: 08/07/2020 01:05    Assessment/Plan **Progressive CKD:  Follows with Dr. Daiva Huge but no appt since 02/2020 due to missed appts.  This appears to be progressive renal decline to point of needing to initiate dialysis.  Discussed in detail and pt ok to proceed.  TDC per IR, HD #1.  Vein map then consult VVS for perm access.   **volume overload: lasix 120 IV BID for now, UF with HD   **HTN urgency:  In setting of missed meds.  Improved BP now that meds resumed.  **Metabolic acidosis: po bicarb until HD.  **Anemia:  Hb 7.8 which is recent Baseline.  Check iron indices. Start ESA once BP bette.   **BMM: check phos,  PTH.   **DM: per primrya.   Justin Mend 08/07/2020, 9:43 AM

## 2020-08-07 NOTE — Consult Note (Signed)
Chief Complaint: Patient was seen in consultation today for tunneled hemodialysis catheter placement Chief Complaint  Patient presents with  . Leg Swelling  . Nausea    Referring Physician(s): Leanora Cover  Supervising Physician: Corrie Mckusick  Patient Status: [facility] - ED  History of Present Illness: [name] is a 35 y.o. male with PMH DM,HLD,HTN and worsening/progressive CKD who presented to [facility] ED last night with LE edema, nausea, dyspnea, lethargy. Current labs include K 4.9, creat 9.77, WBC 5.9, hgb 7, plts 320k, COVID neg. CXR with vascular congestion/interstitial edema/small bilat effusions. Pt seen by nephrology and request now received for tunneled HD cath placement. He is afebrile.   Past Medical History:  Diagnosis Date  . Diabetes mellitus   . High cholesterol   . Hypertension     Past Surgical History:  Procedure Laterality Date  . FRACTURE SURGERY    . I & D EXTREMITY Left 07/16/2014   Procedure: IRRIGATION AND DEBRIDEMENT EXTREMITY/LEFT INDEX FINGER;  Surgeon: Leanora Cover, MD;  Location: [facility];  Service: Orthopedics;  Laterality: Left;    Allergies: Amlodipine and Hydralazine  Medications: Prior to Admission medications   Medication Sig Start Date End Date Taking? Authorizing Provider  carvedilol (COREG) 6.25 MG tablet Take 1 tablet (6.25 mg total) by mouth 2 (two) times daily with a meal. 03/01/20 05/07/20  Alma Friendly, MD  diphenhydramine-acetaminophen (TYLENOL PM) 25-500 MG TABS tablet Take 2 tablets by mouth at bedtime as needed (for sleep).    [provider]  ferrous sulfate 325 (65 FE) MG tablet Take 1 tablet (325 mg total) by mouth daily with breakfast. 03/01/20   Alma Friendly, MD  insulin detemir (LEVEMIR) 100 UNIT/ML injection Inject 0.15 mLs (15 Units total) into the skin daily. 05/10/20   Amin, Ankit Chirag, MD  senna-docusate (SENOKOT-S) 8.6-50 MG tablet Take 1 tablet by mouth at bedtime as needed for moderate  constipation. 05/10/20   Damita Lack, MD     Family History  Problem Relation Age of Onset  . Cancer Mother   . Stroke Father   . Diabetes Father   . Hypertension Father     Social History   Socioeconomic History  . Marital status: Single    Spouse name: Not on file  . Number of children: Not on file  . Years of education: Not on file  . Highest education level: Not on file  Occupational History  . Not on file  Tobacco Use  . Smoking status: Never Smoker  . Smokeless tobacco: Never Used  Vaping Use  . Vaping Use: Never used  Substance and Sexual Activity  . Alcohol use: Yes  . Drug use: No  . Sexual activity: Yes  Other Topics Concern  . Not on file  Social History Narrative  . Not on file   Social Determinants of Health   Financial Resource Strain: Not on file  Food Insecurity: Not on file  Transportation Needs: Not on file  Physical Activity: Not on file  Stress: Not on file  Social Connections: Not on file      Review of Systems denies fever,HA,CP, cough, abd pain/back pain, or bleeding  Vital Signs: BP (!) 165/94 (BP Location: Left Arm)   Pulse 88   Temp 97.9 F (36.6 C) (Oral)   Resp 11   SpO2 99%   Physical Exam sl drowsy but arousable, answers questions ok; chest- sl dim BS bases; heart- RRR; abd soft,+BS,NT; 2-3+ bilat LE edema  Imaging:  DG Chest 2 View  Result Date: 08/07/2020 CLINICAL DATA:  Leg swelling EXAM: CHEST - 2 VIEW COMPARISON:  March 10, 2020 FINDINGS: Again noted is significant cardiomegaly which appears to have increased from prior study. The bronchi are splayed as before. Again noted is vascular congestion with developing interstitial edema. There are small bilateral pleural effusions. No pneumothorax. IMPRESSION: 1. Worsening cardiomegaly. An underlying pericardial effusion is not excluded. 2. Vascular congestion with interstitial edema and small bilateral pleural effusions. Electronically Signed   By: Constance Holster  M.D.   On: 08/07/2020 01:05    Labs:  CBC: Recent Labs    05/07/20 1824 05/08/20 0639 08/06/20 2012 08/07/20 0258  WBC 12.9* 11.3* 5.9 6.5  HGB 6.9* 7.2* 7.0* 7.8*  HCT 22.2* 22.8* 22.2* 25.9*  PLT 307 275 320 336    COAGS: Recent Labs    02/29/20 0843 05/09/20 0835  INR 1.0 1.1  APTT  --  38*    BMP: Recent Labs    02/28/20 0517 02/29/20 0449 03/01/20 0545 03/10/20 1410 05/07/20 1824 05/09/20 0835 05/10/20 0108 08/06/20 2012 08/07/20 0258  NA 139 138 140 135   < > 138 140 138 136  K 3.9 3.9 4.2 4.4   < > 4.3 4.6 5.2* 4.9  CL 110 107 109 105   < > 108 111 108 107  CO2 '24 23 24 '$ 21*   < > 19* 21* 18* 15*  GLUCOSE 99 71 91 123*   < > 67* 74 109* 151*  BUN 43* 41* 41* 49*   < > 48* 49* 89* 91*  CALCIUM 7.9* 8.1* 7.9* 8.0*   < > 8.0* 7.8* 7.4* 7.6*  CREATININE 4.57* 4.30* 4.52* 4.44*   < > 6.48* 6.46* 9.90* 9.77*  GFRNONAA 15* 17* 16* 16*   < > 11* 11* 6* 7*  GFRAA 18* 19* 18* 19*  --   --   --   --   --    < > = values in this interval not displayed.    LIVER FUNCTION TESTS: Recent Labs    02/25/20 1127 02/27/20 0725 02/29/20 0449 03/01/20 0545 05/07/20 1824 08/06/20 2012  BILITOT 0.8  --   --   --  0.7 0.5  AST 17  --   --   --  14* 20  ALT 18  --   --   --  18 39  ALKPHOS 70  --   --   --  73 113  PROT 6.5  --   --   --  6.8 6.0*  ALBUMIN 2.9*   < > 2.6* 2.6* 2.9* 2.3*   < > = values in this interval not displayed.    TUMOR MARKERS: No results for input(s): AFPTM, CEA, CA199, CHROMGRNA in the last 8760 hours.  Assessment and Plan: 35 y.o. male with PMH DM,HLD,HTN and worsening/progressive CKD who presented to [facility] ED last night with LE edema, nausea, dyspnea, lethargy. Current labs include K 4.9, creat 9.77, WBC 5.9, hgb 7, plts 320k, COVID neg. CXR with vascular congestion/interstitial edema/small bilat effusions. Pt seen by nephrology and request now received for tunneled HD cath placement. He is afebrile.Risks and benefits discussed with the  patient including bleeding, infection, damage to adjacent structures, thrombosis and sepsis.  All of the patient's questions were answered, patient is agreeable to proceed. Consent signed and in chart.  Will tent plan procedure for later this pm schedule permitting   Thank you for this interesting consult.  I greatly enjoyed meeting [name] and look forward to participating in their care.  A copy of this report was sent to the requesting provider on this date.  Electronically Signed: D. Rowe Robert, PA-C 08/07/2020, 1:54 PM   I spent a total of 25 minutes    in face to face in clinical consultation, greater than 50% of which was counseling/coordinating care for hemodialysis catheter placement

## 2020-08-07 NOTE — Procedures (Signed)
Interventional Radiology Procedure Note  Procedure: Right IJ tunneled HD catheter placement  Complications: None  Estimated Blood Loss: 10 mL  Findings: 23 cm tip to cuff length Palindrome catheter placed via right IJ vein with tip in RA. OK to use.  Venetia Night. Kathlene Cote, M.D Pager:  [phone]

## 2020-08-07 NOTE — H&P (Signed)
History and Physical    [name] [id number] DOB: September 19, 1985 DOA: 08/06/2020  PCP: Patient, No Pcp Per   Patient coming from: Home   Chief Complaint: Swelling, SOB, nausea    HPI: [name] is a 35 y.o. 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  Patient has had difficulty keeping his follow-up appointments since his hospitalization in October, does not have dialysis access yet, has had progressive lower extremity swelling now up to his hips.  He has also begun progressively short of breath, initially just with exertion, but now at rest also.  He reports waking up at night unable to catch his breath and begins to feel better after sitting up for a while.  Denies any abdominal pain, fevers, chills, or significant cough.  No dysuria or flank pain but he has had decreased urination.  ED Course: Upon arrival to the ED, patient is found to be afebrile, saturating mid 90s on room air, and hypertensive to 180/120.  EKG features sinus rhythm and chest x-ray notable for worsening cardiomegaly with vascular congestion, interstitial edema, and small bilateral pleural effusion.  Chemistry panel notable for potassium 5.2, bicarbonate 18, BUN 89, creatinine 9.90, and albumin 2.3.  CBC features a normocytic anemia with hemoglobin 7.0.  BNP elevated to 2067.  COVID-19 PCR is negative.  Patient was given Coreg in the ED.  Review of Systems:  All other systems reviewed and apart from HPI, are negative.  Past Medical History:  Diagnosis Date  . Diabetes mellitus   . High cholesterol   . Hypertension     Past Surgical History:  Procedure Laterality Date  . FRACTURE SURGERY    . I & D EXTREMITY Left 07/16/2014   Procedure: IRRIGATION AND DEBRIDEMENT EXTREMITY/LEFT INDEX FINGER;  Surgeon: Leanora Cover, MD;  Location: [facility];  Service:  Orthopedics;  Laterality: Left;    Social History:   reports that he has never smoked. He has never used smokeless tobacco. He reports current alcohol use. He reports that he does not use drugs.  Allergies  Allergen Reactions  . Amlodipine Nausea And Vomiting and Other (See Comments)    Patient was taking Amlodipine and Hydralazine at the same time, so the reactions came from one of the 2: Lethargy and an all-over feeling of NOT feeling well (also)  . Hydralazine Nausea And Vomiting and Other (See Comments)    Patient was taking Hydralazine AND Amlodipine at the same time, so the reactions came from one of the 2: Lethargy and an all-over feeling of NOT feeling well (also)    Family History  Problem Relation Age of Onset  . Cancer Mother   . Stroke Father   . Diabetes Father   . Hypertension Father      Prior to Admission medications   Medication Sig Start Date End Date Taking? Authorizing Provider  carvedilol (COREG) 6.25 MG tablet Take 1 tablet (6.25 mg total) by mouth 2 (two) times daily with a meal. 03/01/20 05/07/20  Alma Friendly, MD  diphenhydramine-acetaminophen (TYLENOL PM) 25-500 MG TABS tablet Take 2 tablets by mouth at bedtime as needed (for sleep).    [provider]  ferrous sulfate 325 (65 FE) MG tablet Take 1 tablet (325 mg total) by mouth daily with breakfast. 03/01/20   Alma Friendly, MD  insulin detemir (LEVEMIR) 100 UNIT/ML injection Inject  0.15 mLs (15 Units total) into the skin daily. 05/10/20   Amin, Ankit Chirag, MD  senna-docusate (SENOKOT-S) 8.6-50 MG tablet Take 1 tablet by mouth at bedtime as needed for moderate constipation. 05/10/20   Damita Lack, MD    Physical Exam: Vitals:   08/06/20 2357 08/07/20 0105 08/07/20 0145 08/07/20 0147  BP: (!) 174/118 (!) 183/116 (!) 186/119 (!) 186/119  Pulse: 89 90 95 97  Resp: (!) 22 (!) 22 (!) 22   Temp: 98.2 F (36.8 C)     TempSrc: Oral     SpO2: 100% 100% 99%     Constitutional:  NAD, calm  Eyes: PERTLA, lids and conjunctivae normal ENMT: Mucous membranes are moist. Posterior pharynx clear of any exudate or lesions.   Neck: normal, supple, no masses, no thyromegaly Respiratory: Mild tachypnea, no wheezing. No pallor or cyanosis.  Cardiovascular: S1 & S2 heard, regular rate and rhythm. LE edema to hips.  Abdomen: No distension, no tenderness, soft. Bowel sounds active.  Musculoskeletal: no clubbing / cyanosis. No joint deformity upper and lower extremities.   Skin: no significant rashes, lesions, ulcers. Warm, dry, well-perfused. Neurologic: CN 2-12 grossly intact. Sensation intact. Moving all extremities.  Psychiatric: Alert and oriented to person, place, and situation. Very pleasant and cooperative.    Labs and Imaging on Admission: I have personally reviewed following labs and imaging studies  CBC: Recent Labs  Lab 08/06/20 2012  WBC 5.9  HGB 7.0*  HCT 22.2*  MCV 83.8  PLT 99991111   Basic Metabolic Panel: Recent Labs  Lab 08/06/20 2012  NA 138  K 5.2*  CL 108  CO2 18*  GLUCOSE 109*  BUN 89*  CREATININE 9.90*  CALCIUM 7.4*   GFR: CrCl cannot be calculated (Unknown ideal weight.). Liver Function Tests: Recent Labs  Lab 08/06/20 2012  AST 20  ALT 39  ALKPHOS 113  BILITOT 0.5  PROT 6.0*  ALBUMIN 2.3*   Recent Labs  Lab 08/06/20 2012  LIPASE 23   No results for input(s): AMMONIA in the last 168 hours. Coagulation Profile: No results for input(s): INR, PROTIME in the last 168 hours. Cardiac Enzymes: No results for input(s): CKTOTAL, CKMB, CKMBINDEX, TROPONINI in the last 168 hours. BNP (last 3 results) No results for input(s): PROBNP in the last 8760 hours. HbA1C: No results for input(s): HGBA1C in the last 72 hours. CBG: No results for input(s): GLUCAP in the last 168 hours. Lipid Profile: No results for input(s): CHOL, HDL, LDLCALC, TRIG, CHOLHDL, LDLDIRECT in the last 72 hours. Thyroid Function Tests: No results for input(s):  TSH, T4TOTAL, FREET4, T3FREE, THYROIDAB in the last 72 hours. Anemia Panel: No results for input(s): VITAMINB12, FOLATE, FERRITIN, TIBC, IRON, RETICCTPCT in the last 72 hours. Urine analysis:    Component Value Date/Time   COLORURINE YELLOW 05/08/2020 0639   APPEARANCEUR HAZY (A) 05/08/2020 0639   LABSPEC 1.012 05/08/2020 0639   PHURINE 5.0 05/08/2020 0639   GLUCOSEU NEGATIVE 05/08/2020 0639   HGBUR NEGATIVE 05/08/2020 0639   BILIRUBINUR NEGATIVE 05/08/2020 0639   KETONESUR NEGATIVE 05/08/2020 0639   PROTEINUR >=300 (A) 05/08/2020 0639   UROBILINOGEN 0.2 07/16/2014 1747   NITRITE NEGATIVE 05/08/2020 0639   LEUKOCYTESUR NEGATIVE 05/08/2020 0639   Sepsis Labs: '@LABRCNTIP'$ (procalcitonin:4,lacticidven:4) ) Recent Results (from the past 240 hour(s))  SARS Coronavirus 2 by RT PCR (hospital order, performed in [facility] hospital lab) Nasopharyngeal Nasopharyngeal Swab     Status: None   Collection Time: 08/06/20  8:08 PM  Specimen: Nasopharyngeal Swab  Result Value Ref Range Status   SARS Coronavirus 2 NEGATIVE NEGATIVE Final    Comment: (NOTE) SARS-CoV-2 target nucleic acids are NOT DETECTED.  The SARS-CoV-2 RNA is generally detectable in upper and lower respiratory specimens during the acute phase of infection. The lowest concentration of SARS-CoV-2 viral copies this assay can detect is 250 copies / mL. A negative result does not preclude SARS-CoV-2 infection and should not be used as the sole basis for treatment or other patient management decisions.  A negative result may occur with improper specimen collection / handling, submission of specimen other than nasopharyngeal swab, presence of viral mutation(s) within the areas targeted by this assay, and inadequate number of viral copies (<250 copies / mL). A negative result must be combined with clinical observations, patient history, and epidemiological information.  Fact Sheet for Patients:    StrictlyIdeas.no  Fact Sheet for Healthcare Providers: BankingDealers.co.za  This test is not yet approved or  cleared by the [place] FDA and has been authorized for detection and/or diagnosis of SARS-CoV-2 by FDA under an Emergency Use Authorization (EUA).  This EUA will remain in effect (meaning this test can be used) for the duration of the COVID-19 declaration under Section 564(b)(1) of the Act, 21 U.S.C. section 360bbb-3(b)(1), unless the authorization is terminated or revoked sooner.  Performed at [facility], [place] [address], [place], [place] [address]      Radiological Exams on Admission: DG Chest 2 View  Result Date: 08/07/2020 CLINICAL DATA:  Leg swelling EXAM: CHEST - 2 VIEW COMPARISON:  March 10, 2020 FINDINGS: Again noted is significant cardiomegaly which appears to have increased from prior study. The bronchi are splayed as before. Again noted is vascular congestion with developing interstitial edema. There are small bilateral pleural effusions. No pneumothorax. IMPRESSION: 1. Worsening cardiomegaly. An underlying pericardial effusion is not excluded. 2. Vascular congestion with interstitial edema and small bilateral pleural effusions. Electronically Signed   By: Constance Holster M.D.   On: 08/07/2020 01:05    EKG: Independently reviewed. Sinus rhythm.   Assessment/Plan  1. CKD V with hypervolemia, uremia, hyperkalemia, acidosis  - Presents with SOB, nausea, and leg swelling to the hips and is found to have BUN 89 and SCr 9.90 (49 and 6.5 in October '21) with hypervolemia, mild acidosis and borderline hyperkalemia  - Echo from May 2021 with normal EF and normal diastolic parameters; albumin is 2.3  - Diurese with IV Lasix 60 mg IV q12h, renally-dose medications, monitor I/Os and chemistries     2. Hypertensive urgency  - BP 180/120 in ED  - Diurese, continue Coreg, use labetalol as needed    3. Anemia   - Hgb is 7.0 on admission, similar to priors, no overt bleeding, likely secondary to CKD  - Type and screen, monitor, transfuse if needed    4. Insulin-dependent DM  - A1c was 6.1% in October 2021  - Check CBGs and use low-intensity SSI     DVT prophylaxis: sq heparin  Code Status: Full  Level of Care: Level of care: Telemetry Medical Family Communication: Discussed with patient  Disposition Plan:  Patient is from: Home  Anticipated d/c is to: Home  Anticipated d/c date is: 08/13/20 Patient currently: Hypervolemic and uremic, will likely need to start HD this admission  Consults called: None  Admission status: Inpatient     Vianne Bulls, MD [facility]  08/07/2020, 2:27 AM

## 2020-08-07 NOTE — ED Provider Notes (Signed)
[facility] EMERGENCY DEPARTMENT Provider Note   CSN: [id number] Arrival date & time: 08/06/20  1913     History Chief Complaint  Patient presents with  . Leg Swelling  . Nausea    [name] is a 35 y.o. male.  35 y/o male with hx of DM, HLD, HTN, ESRD presents to the ED for evaluation of BLE swelling. States he has had a fairly chronic degree of lower extremity swelling which has acutely worsened in the past 48 hours.  States that swelling has progressed to his thighs and genital region.  While he was previously voiding 3-4 times per day, he has only been voiding 1-2 times per day since his swelling worsened.  He has also been feeling more short of breath with dyspnea upon minimal exertion.  Notes weight gain with recent weight ~240 pounds (baseline around 210).  Has been sleeping propped up in his bed.  Feels fatigued with subjective chills, nausea.  No fever, syncope, chest pain, vomiting.  On chart review, patient was hospitalized in October for progression of his CKD to ESRD.  He was discharged when his kidney function was found to be stable with plan to coordinate outpatient vascular follow-up for HD catheter placement.  Patient states that he was unable to make any of these follow-up appointments as he was training for a new job.  He was compliant with his daily prescriptions until he ran out of his medication in early December.  The history is provided by the patient. No language interpreter was used.       Past Medical History:  Diagnosis Date  . Diabetes mellitus   . High cholesterol   . Hypertension     Patient Active Problem List   Diagnosis Date Noted  . Nausea and vomiting 05/07/2020  . Anemia of chronic kidney failure 05/07/2020  . Dehydration   . Hypertensive urgency 02/25/2020  . Acute renal failure superimposed on stage 4 chronic kidney disease ([place]) 12/02/2019  . Anemia 12/02/2019  . Essential hypertension 12/02/2019  . SOB  (shortness of breath) 12/02/2019  . DM2 (diabetes mellitus, type 2) ([place]) 12/02/2019  . Diarrhea 03/23/2017  . Early satiety 03/23/2017  . Generalized abdominal pain 03/23/2017    Past Surgical History:  Procedure Laterality Date  . FRACTURE SURGERY    . I & D EXTREMITY Left 07/16/2014   Procedure: IRRIGATION AND DEBRIDEMENT EXTREMITY/LEFT INDEX FINGER;  Surgeon: Leanora Cover, MD;  Location: [place];  Service: Orthopedics;  Laterality: Left;       Family History  Problem Relation Age of Onset  . Cancer Mother   . Stroke Father   . Diabetes Father   . Hypertension Father     Social History   Tobacco Use  . Smoking status: Never Smoker  . Smokeless tobacco: Never Used  Vaping Use  . Vaping Use: Never used  Substance Use Topics  . Alcohol use: Yes  . Drug use: No    Home Medications Prior to Admission medications   Medication Sig Start Date End Date Taking? Authorizing Provider  carvedilol (COREG) 6.25 MG tablet Take 1 tablet (6.25 mg total) by mouth 2 (two) times daily with a meal. 03/01/20 05/07/20  Alma Friendly, MD  diphenhydramine-acetaminophen (TYLENOL PM) 25-500 MG TABS tablet Take 2 tablets by mouth at bedtime as needed (for sleep).    [provider]  ferrous sulfate 325 (65 FE) MG tablet Take 1 tablet (325 mg total) by mouth daily with  breakfast. 03/01/20   Alma Friendly, MD  insulin detemir (LEVEMIR) 100 UNIT/ML injection Inject 0.15 mLs (15 Units total) into the skin daily. 05/10/20   Amin, Ankit Chirag, MD  senna-docusate (SENOKOT-S) 8.6-50 MG tablet Take 1 tablet by mouth at bedtime as needed for moderate constipation. 05/10/20   Damita Lack, MD    Allergies    Amlodipine and Hydralazine  Review of Systems   Review of Systems  Ten systems reviewed and are negative for acute change, except as noted in the HPI.    Physical Exam Updated Vital Signs BP (!) 186/119   Pulse 97   Temp 98.2 F (36.8 C) (Oral)   Resp (!) 22   SpO2  100%   Physical Exam Vitals and nursing note reviewed.  Constitutional:      General: He is not in acute distress.    Appearance: He is well-developed and well-nourished. He is not diaphoretic.     Comments: Ill appearing, fatigued, but nontoxic.   HENT:     Head: Normocephalic and atraumatic.  Eyes:     General: No scleral icterus.    Extraocular Movements: EOM normal.     Conjunctiva/sclera: Conjunctivae normal.  Cardiovascular:     Rate and Rhythm: Normal rate and regular rhythm.     Pulses: Normal pulses.  Pulmonary:     Effort: Pulmonary effort is normal. No respiratory distress.     Breath sounds: No stridor. No wheezing.     Comments: Decreased BS in b/l bases. Respirations even and unlabored Musculoskeletal:        General: Normal range of motion.     Cervical back: Normal range of motion.     Right lower leg: Edema present.     Left lower leg: Edema present.     Comments: 2+ pitting edema BLE   Skin:    General: Skin is warm and dry.     Coloration: Skin is not pale.     Findings: No erythema or rash.  Neurological:     Mental Status: He is alert and oriented to person, place, and time.     Coordination: Coordination normal.  Psychiatric:        Mood and Affect: Mood and affect normal.        Behavior: Behavior normal.     ED Results / Procedures / Treatments   Labs (all labs ordered are listed, but only abnormal results are displayed) Labs Reviewed  COMPREHENSIVE METABOLIC PANEL - Abnormal; Notable for the following components:      Result Value   Potassium 5.2 (*)    CO2 18 (*)    Glucose, Bld 109 (*)    BUN 89 (*)    Creatinine, Ser 9.90 (*)    Calcium 7.4 (*)    Total Protein 6.0 (*)    Albumin 2.3 (*)    GFR, Estimated 6 (*)    All other components within normal limits  CBC - Abnormal; Notable for the following components:   RBC 2.65 (*)    Hemoglobin 7.0 (*)    HCT 22.2 (*)    RDW 16.4 (*)    All other components within normal limits  BRAIN  NATRIURETIC PEPTIDE - Abnormal; Notable for the following components:   B Natriuretic Peptide 2,067.3 (*)    All other components within normal limits  SARS CORONAVIRUS 2 BY RT PCR (HOSPITAL ORDER, [facility])  LIPASE, BLOOD  URINALYSIS, ROUTINE W REFLEX MICROSCOPIC  EKG EKG Interpretation  Date/Time:  Thursday August 07 2020 01:34:24 EST Ventricular Rate:  96 PR Interval:    QRS Duration: 94 QT Interval:  373 QTC Calculation: 472 R Axis:   117 Text Interpretation: Sinus rhythm Right axis deviation Borderline prolonged QT interval Otherwise no significant change Confirmed by Addison Lank [phone]) on 08/07/2020 1:42:31 AM   Radiology DG Chest 2 View  Result Date: 08/07/2020 CLINICAL DATA:  Leg swelling EXAM: CHEST - 2 VIEW COMPARISON:  March 10, 2020 FINDINGS: Again noted is significant cardiomegaly which appears to have increased from prior study. The bronchi are splayed as before. Again noted is vascular congestion with developing interstitial edema. There are small bilateral pleural effusions. No pneumothorax. IMPRESSION: 1. Worsening cardiomegaly. An underlying pericardial effusion is not excluded. 2. Vascular congestion with interstitial edema and small bilateral pleural effusions. Electronically Signed   By: Constance Holster M.D.   On: 08/07/2020 01:05    Procedures Procedures     EMERGENCY DEPARTMENT [place] CARDIAC EXAM "Study: Limited Ultrasound of the Heart and Pericardium"  INDICATIONS:Dyspnea Multiple views of the heart and pericardium were obtained in real-time with a multi-frequency probe.  PERFORMED [id number] IMAGES ARCHIVED?: No LIMITATIONS:  Emergent procedure VIEWS USED: Subcostal 4 chamber and Apical 4 chamber  INTERPRETATION: Pericardial effusioin absent   Medications Ordered in ED Medications  carvedilol (COREG) tablet 6.25 mg (6.25 mg Oral Given 08/07/20 0147)    ED Course  I have reviewed the triage vital signs and the  nursing notes.  Pertinent labs & imaging results that were available during my care of the patient were reviewed by me and considered in my medical decision making (see chart for details).  Clinical Course as of 08/07/20 0150  Thu Aug 07, 2020  0130 Bedside US without evidence of pericardial effusion. [KH]    Clinical Course User Index [KH] Antonietta Breach, PA-C   MDM Rules/Calculators/A&P                          35 year old male with a history of ESRD presents to the emergency department for evaluation of worsening lower extremity swelling over the past 48 hours with increased shortness of breath.  Has been feeling generally fatigued with chills, no fever.  Noncompliant with daily medications since early December.  His evaluation today is consistent with new onset CHF secondary to ESRD. He has 2+ pitting edema in his BLE. States this swelling has extended to his genital region; GU exam deferred in ED. BNP 2067 and CXR with vascular congestion and interstitial edema. Cardiomegaly appears worse compared to August; no pericardial effusion noted on bedside US. No respiratory distress, hypoxia. Denies chest pain.   Chronic normocytic anemia is stable and likely attributed to his CKD. Was supposed to be started on HD after last admission in October, but never followed up outpatient with nephrology or vascular. Creatinine has worsened to 9.9 since this time. Likely uremic given c/o fatigue/malaise, chills, nausea. No present concern for SIRS/Sepsis. COVID negative.  Will admit for further management and diuresis. Case discussed with Dr. Myna Hidalgo who will admit.   Final Clinical Impression(s) / ED Diagnoses Final diagnoses:  SOB (shortness of breath)  Acute congestive heart failure, unspecified heart failure type ([place])  ESRD with anemia [facility])    Rx / DC Orders ED Discharge Orders    None       Antonietta Breach, PA-C 08/07/20 0203    Fatima Blank, MD 08/07/20 [phone]

## 2020-08-07 NOTE — Plan of Care (Signed)

## 2020-08-07 NOTE — Progress Notes (Signed)
Patient seen and examined personally, I reviewed the chart, history and physical and admission note, done by admitting physician this morning and agree with the same with following addendum.  Please refer to the morning admission note for more detailed plan of care.  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  Hyperkalemia 5.2 metabolic acidosis BUN 89 creatinine 9.9, anemia COVID-19 negative BNP elevated 2067. Patient placed on IV Lasix and admitted.  On my exam this morning appears fluid overloaded with edema up to thigh, not hypoxic or in any respiratory distress.  Feeling cold Nursing reports he has not made urine after last dose of Lasix at 3 AM.  ISSUES CKD 5 with fluid overload uremia hyperkalemia metabolic acidosis: Continue IV Lasix, nephrology consulted keep n.p.o. until then, echo from May 2021 with normal EF and diastolic parameters.  May be heading near dialysis.  K at 4.9, bicarb 15 BUN/creatinine 91/9.7  Hypertensive urgency: BP 150s now. Normocytic anemia with anemia chronic renal disease Insulin-dependent diabetes mellitus hemoglobin A1c 6.11 April 2020 Obesity  Discussed with nursing staff, Dr. Johnney Ou

## 2020-08-07 NOTE — Sedation Documentation (Signed)
Vital signs stable. Procedure continues °

## 2020-08-07 NOTE — Sedation Documentation (Signed)
Vital signs stable. Procedure started 

## 2020-08-08 ENCOUNTER — Inpatient Hospital Stay (HOSPITAL_COMMUNITY): Payer: Medicaid Other

## 2020-08-08 DIAGNOSIS — N186 End stage renal disease: Secondary | ICD-10-CM

## 2020-08-08 DIAGNOSIS — N185 Chronic kidney disease, stage 5: Secondary | ICD-10-CM

## 2020-08-08 LAB — BASIC METABOLIC PANEL
Anion gap: 12 (ref 5–15)
BUN: 91 mg/dL — ABNORMAL HIGH (ref 6–20)
CO2: 18 mmol/L — ABNORMAL LOW (ref 22–32)
Calcium: 7.3 mg/dL — ABNORMAL LOW (ref 8.9–10.3)
Chloride: 109 mmol/L (ref 98–111)
Creatinine, Ser: 10.32 mg/dL — ABNORMAL HIGH (ref 0.61–1.24)
GFR, Estimated: 6 mL/min — ABNORMAL LOW (ref >60)
Glucose, Bld: 106 mg/dL — ABNORMAL HIGH (ref 70–99)
Potassium: 5.1 mmol/L (ref 3.5–5.1)
Sodium: 139 mmol/L (ref 135–145)

## 2020-08-08 LAB — GLUCOSE, CAPILLARY
Glucose-Capillary: 108 mg/dL — ABNORMAL HIGH (ref 70–99)
Glucose-Capillary: 108 mg/dL — ABNORMAL HIGH (ref 70–99)
Glucose-Capillary: 122 mg/dL — ABNORMAL HIGH (ref 70–99)
Glucose-Capillary: 141 mg/dL — ABNORMAL HIGH (ref 70–99)
Glucose-Capillary: 94 mg/dL (ref 70–99)

## 2020-08-08 LAB — IRON AND TIBC
Iron: 42 ug/dL — ABNORMAL LOW (ref 45–182)
Saturation Ratios: 18 % (ref 17.9–39.5)
TIBC: 231 ug/dL — ABNORMAL LOW (ref 250–450)
UIBC: 189 ug/dL

## 2020-08-08 LAB — CBC
HCT: 22.4 % — ABNORMAL LOW (ref 39.0–52.0)
Hemoglobin: 6.8 g/dL — CL (ref 13.0–17.0)
MCH: 26 pg (ref 26.0–34.0)
MCHC: 30.4 g/dL (ref 30.0–36.0)
MCV: 85.5 fL (ref 80.0–100.0)
Platelets: 311 10*3/uL (ref 150–400)
RBC: 2.62 MIL/uL — ABNORMAL LOW (ref 4.22–5.81)
RDW: 16.3 % — ABNORMAL HIGH (ref 11.5–15.5)
WBC: 5.3 10*3/uL (ref 4.0–10.5)
nRBC: 0 % (ref 0.0–0.2)

## 2020-08-08 LAB — HEMOGLOBIN AND HEMATOCRIT, BLOOD
HCT: 24.1 % — ABNORMAL LOW (ref 39.0–52.0)
Hemoglobin: 7.5 g/dL — ABNORMAL LOW (ref 13.0–17.0)

## 2020-08-08 LAB — PREPARE RBC (CROSSMATCH)

## 2020-08-08 LAB — FERRITIN: Ferritin: 328 ng/mL (ref 24–336)

## 2020-08-08 MED ORDER — SEVELAMER CARBONATE 800 MG PO TABS
800.0000 mg | ORAL_TABLET | Freq: Three times a day (TID) | ORAL | Status: DC
  Administered 2020-08-08 – 2020-08-13 (×13): 800 mg via ORAL
  Filled 2020-08-08 (×13): qty 1

## 2020-08-08 MED ORDER — SODIUM CHLORIDE 0.9% IV SOLUTION: Freq: Once | INTRAVENOUS | Status: AC

## 2020-08-08 MED ORDER — TORSEMIDE 100 MG PO TABS
100.0000 mg | ORAL_TABLET | Freq: Two times a day (BID) | ORAL | Status: DC
  Administered 2020-08-08 (×2): 100 mg via ORAL
  Filled 2020-08-08 (×3): qty 1

## 2020-08-08 MED ORDER — METOLAZONE 5 MG PO TABS
5.0000 mg | ORAL_TABLET | Freq: Every day | ORAL | Status: DC
  Administered 2020-08-08 – 2020-08-13 (×5): 5 mg via ORAL
  Filled 2020-08-08 (×6): qty 1

## 2020-08-08 MED ORDER — SODIUM CHLORIDE 0.9 % IV SOLN
125.0000 mg | INTRAVENOUS | Status: DC
  Filled 2020-08-08 (×2): qty 10

## 2020-08-08 NOTE — Progress Notes (Signed)
Marland Kitchen  PROGRESS NOTE    [name]  [id number] DOB: 09-23-1985 DOA: 08/06/2020 PCP: Patient, No Pcp Per   Chief Complaint  Patient presents with  . Leg Swelling  . 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  Hyperkalemia 5.2 metabolic acidosis BUN 89 creatinine 9.9, anemia COVID-19 negative BNP elevated 2067. Patient placed on IV Lasix and admitted. Nephrology was consulted   Subjective: Seen this morning.  Is resting comfortably.  Leg edema improving. Denies nausea vomiting chest pain.  Assessment & Plan:  Progressive CKD: Followed by Dr. Candiss Norse but had missed appointments since 8/21 now with progressive decline appreciate nephrology input, TDC placed by IR  And for vein mapping for permanent access.  HD per Nephro  Volume overload due to progressive CKD continue oral torsemide, continue dialysis   Metabolic acidosis/uremic symptoms in the setting of #1 continue p.o. bicarb monitor labs  Anemia likely from chronic renal disease follow-up iron studies ESA per nephrology.  H&H this morning 6.8 g Fecal occult blood pending, ferritin is normal, serum iron slightly lower side at 42-will benefit with iron replacement.  He will likelyll benefit with blood transfusion in dialysis today, he is agreeable with 1 unit PRBC transfusion after explaining risks/benefits-last transfusion in Nov 2021. 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  Blood pressure stabilized continue home meds  Insulin-dependent diabetes mellitus hemoglobin A1c 6.11 April 2020, continue to monitor CBG  Morbid obesity BMI 33.  Will benefit with weight loss.  Hopefully weight  will improve after addressing fluid overload.    Nutrition: Diet Order            Diet renal/carb modified with fluid restriction Diet-HS Snack? Nothing; Fluid restriction: 1200 mL Fluid; Room service appropriate? Yes; Fluid consistency: Thin  Diet effective now                 Body mass index is 33.49 kg/m.  DVT prophylaxis: heparin injection 5,000 Units Start: 08/07/20 0600 Code Status:   Code Status: Full Code  Family Communication: plan of care discussed with patient at bedside.  Status is: Inpatient Remains inpatient appropriate because:Inpatient level of care appropriate due to severity of illness and For ongoing management of fluid overload CKD, anemia  Dispo: The patient is from: Home              Anticipated d/c is to: Home              Anticipated d/c date is: 2 days              Patient currently is not medically stable to d/c.   Difficult to place patient No  Consultants:see note  Procedures:see note  Culture/Microbiology    Component Value Date/Time   SDES ABSCESS FINGER LEFT 07/16/2014 2000   SDES ABSCESS FINGER LEFT 07/16/2014 2000   SPECREQUEST NONE 07/16/2014 2000   [place] NONE 07/16/2014 2000   CULT  07/16/2014 2000    NO ANAEROBES ISOLATED Performed at  [facility]    CULT  07/16/2014 2000    MODERATE STREPTOCOCCUS GROUP F Performed at [facility] 07/22/2014 FINAL 07/16/2014 2000   REPTSTATUS 07/19/2014 FINAL 07/16/2014 2000    Other culture-see note  Medications: Scheduled Meds: . carvedilol  6.25 mg Oral BID WC  . Chlorhexidine Gluconate Cloth  6 each Topical Q0600  . heparin  5,000 Units Subcutaneous Q8H  . insulin aspart  0-6 Units Subcutaneous Q4H  .  sevelamer carbonate  800 mg Oral TID WC  . sodium bicarbonate  650 mg Oral TID  . torsemide  100 mg Oral BID   Continuous Infusions: . ferric gluconate (FERRLECIT/NULECIT) IV      Antimicrobials: Anti-infectives (From admission, onward)   Start     Dose/Rate Route Frequency Ordered Stop   08/07/20 1415  ceFAZolin (ANCEF) IVPB 2g/100 mL premix        2 g 200 mL/hr over 30 Minutes Intravenous To Radiology 08/07/20 1405 08/07/20 1649     Objective: Vitals: Today's Vitals   08/07/20 2153 08/07/20 2355 08/08/20 0356 08/08/20 0729  BP:  (!) 151/98 (!) 153/96   Pulse:  72 79   Resp:  16 16   Temp:  98.2 F (36.8 C) 98.1 F (36.7 C)   TempSrc:  Oral Oral   SpO2:  95% 94%   Weight: 112 kg     Height: 6' (1.829 m)     PainSc:    0-No pain    Intake/Output Summary (Last 24 hours) at 08/08/2020 0800 Last data filed at 08/07/2020 2200 Gross per 24 hour  Intake 398.35 ml  Output 1375 ml  Net -976.65 ml   Filed Weights   08/07/20 2153  Weight: 112 kg   Weight change:   Intake/Output from previous day: 01/27 0701 - 01/28 0700 In: 398.4 [P.O.:240; IV Piggyback:158.4] Out: [id number] [Urine:1375] Intake/Output this shift: No intake/output data recorded. Filed Weights   08/07/20 2153  Weight: 112 kg    Examination: General exam: AAOx3 ,NAD, weak appearing. HEENT:Oral mucosa moist, Ear/Nose WNL grossly,dentition normal. Respiratory system: bilaterally diminished,no wheezing or crackles,no use of accessory muscle, non tender. Cardiovascular system: S1 & S2 +, regular, No JVD. Gastrointestinal system: Abdomen soft, NT,ND, BS+. Nervous System:Alert, awake, moving extremities and grossly nonfocal Extremities: Bilateral lower extremity edema improving , distal peripheral pulses palpable.  Skin: No rashes,no icterus. MSK: Normal muscle bulk,tone, power   Data Reviewed: I have personally reviewed following labs and imaging studies CBC: Recent Labs  Lab 08/06/20 2012  08/07/20 0258 08/08/20 0534  WBC 5.9 6.5 5.3  HGB 7.0* 7.8* 6.8*  HCT 22.2* 25.9* 22.4*  MCV 83.8 86.3 85.5  PLT 320 336 [id number]   Basic Metabolic Panel: Recent Labs  Lab 08/06/20 2012 08/07/20 0258 08/07/20 1806 08/08/20 0534  NA 138 136  --  139  K 5.2* 4.9  --  5.1  CL 108 107  --  109  CO2 18* 15*  --  18*  GLUCOSE 109* 151*  --  106*  BUN 89* 91*  --  91*  CREATININE 9.90* 9.77*  --  10.32*  CALCIUM 7.4* 7.6*  --  7.3*  MG  --  1.8  --   --   PHOS  --  6.5* 7.0*  --    GFR: Estimated Creatinine Clearance: 12.9 mL/min (A) (by C-G formula based on SCr of 10.32 mg/dL (H)). Liver Function Tests: Recent Labs  Lab 08/06/20 2012  AST 20  ALT 39  ALKPHOS 113  BILITOT 0.5  PROT 6.0*  ALBUMIN 2.3*   Recent Labs  Lab 08/06/20 2012  LIPASE 23   No results for input(s): AMMONIA in the last 168 hours. Coagulation Profile: No results for input(s): INR, PROTIME in the last 168 hours. Cardiac Enzymes: No results for input(s): CKTOTAL, CKMB, CKMBINDEX, TROPONINI in the last 168 hours. BNP (last 3 results) No results for input(s): PROBNP in the last 8760 hours. HbA1C: Recent Labs    08/07/20 0258  HGBA1C 6.0*   CBG: Recent Labs  Lab 08/07/20 1145 08/07/20 1656 08/07/20 2141 08/07/20 2352 08/08/20 0353  GLUCAP 82 100* 94 148* 108*   Lipid Profile: No results for input(s): CHOL, HDL, LDLCALC, TRIG, CHOLHDL, LDLDIRECT in the last 72 hours. Thyroid Function Tests: No results for input(s): TSH, T4TOTAL, FREET4, T3FREE, THYROIDAB in the last 72 hours. Anemia Panel: Recent Labs    08/08/20 0534  FERRITIN 328  TIBC 231*  IRON 42*   Sepsis Labs: No results for input(s): PROCALCITON, LATICACIDVEN in the last 168 hours.  Recent Results (from the past 240 hour(s))  SARS Coronavirus 2 by RT PCR (hospital order, performed in [facility] hospital lab) Nasopharyngeal Nasopharyngeal Swab     Status: None   Collection Time: 08/06/20  8:08 PM   Specimen:  Nasopharyngeal Swab  Result Value Ref Range Status   SARS Coronavirus 2 NEGATIVE NEGATIVE Final    Comment: (NOTE) SARS-CoV-2 target nucleic acids are NOT DETECTED.  The SARS-CoV-2 RNA is generally detectable in upper and lower respiratory specimens during the acute phase of infection. The lowest concentration of SARS-CoV-2 viral copies this assay can detect is 250 copies / mL. A negative result does not preclude SARS-CoV-2 infection and should not be used as the sole basis for treatment or other patient management decisions.  A negative result may occur with improper specimen collection / handling, submission of specimen other than nasopharyngeal swab, presence of viral mutation(s) within the areas targeted by this assay, and inadequate number of viral copies (<250 copies / mL). A negative result must be combined with clinical observations, patient history, and epidemiological information.  Fact Sheet for Patients:   StrictlyIdeas.no  Fact Sheet for Healthcare Providers: BankingDealers.co.za  This test is not yet approved or  cleared by the [place] FDA and has been authorized for detection and/or diagnosis of SARS-CoV-2 by FDA under an Emergency Use Authorization (EUA).  This EUA will remain in effect (meaning this test can be used) for the duration of the COVID-19 declaration under Section 564(b)(1) of the Act, 21 U.S.C. section 360bbb-3(b)(1), unless the authorization is terminated or revoked sooner.  Performed at [facility], [place] [address] [place], [place] [address]      Radiology Studies: DG Chest 2 View  Result Date: 08/07/2020 CLINICAL DATA:  Leg swelling EXAM: CHEST - 2 VIEW COMPARISON:  March 10, 2020 FINDINGS: Again noted is significant cardiomegaly which appears to have increased from prior study. The bronchi are splayed as before. Again noted is vascular congestion with developing interstitial edema.  There are small bilateral pleural effusions. No pneumothorax. IMPRESSION: 1. Worsening cardiomegaly. An underlying pericardial effusion is not excluded. 2. Vascular congestion with interstitial edema and small bilateral pleural effusions. Electronically Signed   By: Constance Holster M.D.   On: 08/07/2020 01:05   IR US Guide Vasc Access Right  Result Date: 08/07/2020 CLINICAL DATA:  Renal failure and need for tunneled hemodialysis catheter to begin hemodialysis treatment. EXAM:  TUNNELED CENTRAL VENOUS HEMODIALYSIS CATHETER PLACEMENT WITH ULTRASOUND AND FLUOROSCOPIC GUIDANCE ANESTHESIA/SEDATION: 2.0 mg IV Versed; 50 mcg IV Fentanyl. Total Moderate Sedation Time:   28 minutes. The patient's level of consciousness and physiologic status were continuously monitored during the procedure by Radiology nursing. MEDICATIONS: 2 g IV Ancef. FLUOROSCOPY TIME:  36 seconds. 6.1 mGy. PROCEDURE: The procedure, risks, benefits, and alternatives were explained to the patient. Questions regarding the procedure were encouraged and answered. The patient understands and consents to the procedure. A timeout was performed prior to initiating the procedure. Ultrasound was utilized to confirm patency of the right internal jugular vein. The right neck and chest were prepped with chlorhexidine in a sterile fashion, and a sterile drape was applied covering the operative field. Maximum barrier sterile technique with sterile gowns and gloves were used for the procedure. Local anesthesia was provided with 1% lidocaine. After creating a small venotomy incision, a 21 gauge needle was advanced into the right internal jugular vein under direct, real-time ultrasound guidance. Ultrasound image documentation was performed. After securing guidewire access, an 8 Fr dilator was placed. A J-wire was kinked to measure appropriate catheter length. A Palindrome tunneled hemodialysis catheter measuring 23 cm from tip to cuff was chosen for placement. This  was tunneled in a retrograde fashion from the chest wall to the venotomy incision. At the venotomy, serial dilatation was performed and a 15 Fr peel-away sheath was placed over a guidewire. The catheter was then placed through the sheath and the sheath removed. Final catheter positioning was confirmed and documented with a fluoroscopic spot image. The catheter was aspirated, flushed with saline, and injected with appropriate volume heparin dwells. The venotomy incision was closed with subcuticular 4-0 Vicryl. Dermabond was applied to the incision. The catheter exit site was secured with 0-Prolene retention sutures. COMPLICATIONS: None.  No pneumothorax. FINDINGS: After catheter placement, the tip lies in the right atrium. The catheter aspirates normally and is ready for immediate use. IMPRESSION: Placement of tunneled hemodialysis catheter via the right internal jugular vein. The catheter tip lies in the right atrium. The catheter is ready for immediate use. Electronically Signed   By: Aletta Edouard M.D.   On: 08/07/2020 17:01   IR TUNNELED CENTRAL VENOUS CATHETER PLACEMENT  Result Date: 08/07/2020 CLINICAL DATA:  Renal failure and need for tunneled hemodialysis catheter to begin hemodialysis treatment. EXAM: TUNNELED CENTRAL VENOUS HEMODIALYSIS CATHETER PLACEMENT WITH ULTRASOUND AND FLUOROSCOPIC GUIDANCE ANESTHESIA/SEDATION: 2.0 mg IV Versed; 50 mcg IV Fentanyl. Total Moderate Sedation Time:   28 minutes. The patient's level of consciousness and physiologic status were continuously monitored during the procedure by Radiology nursing. MEDICATIONS: 2 g IV Ancef. FLUOROSCOPY TIME:  36 seconds. 6.1 mGy. PROCEDURE: The procedure, risks, benefits, and alternatives were explained to the patient. Questions regarding the procedure were encouraged and answered. The patient understands and consents to the procedure. A timeout was performed prior to initiating the procedure. Ultrasound was utilized to confirm patency of  the right internal jugular vein. The right neck and chest were prepped with chlorhexidine in a sterile fashion, and a sterile drape was applied covering the operative field. Maximum barrier sterile technique with sterile gowns and gloves were used for the procedure. Local anesthesia was provided with 1% lidocaine. After creating a small venotomy incision, a 21 gauge needle was advanced into the right internal jugular vein under direct, real-time ultrasound guidance. Ultrasound image documentation was performed. After securing guidewire access, an 8 Fr dilator was placed. A J-wire was kinked to measure  appropriate catheter length. A Palindrome tunneled hemodialysis catheter measuring 23 cm from tip to cuff was chosen for placement. This was tunneled in a retrograde fashion from the chest wall to the venotomy incision. At the venotomy, serial dilatation was performed and a 15 Fr peel-away sheath was placed over a guidewire. The catheter was then placed through the sheath and the sheath removed. Final catheter positioning was confirmed and documented with a fluoroscopic spot image. The catheter was aspirated, flushed with saline, and injected with appropriate volume heparin dwells. The venotomy incision was closed with subcuticular 4-0 Vicryl. Dermabond was applied to the incision. The catheter exit site was secured with 0-Prolene retention sutures. COMPLICATIONS: None.  No pneumothorax. FINDINGS: After catheter placement, the tip lies in the right atrium. The catheter aspirates normally and is ready for immediate use. IMPRESSION: Placement of tunneled hemodialysis catheter via the right internal jugular vein. The catheter tip lies in the right atrium. The catheter is ready for immediate use. Electronically Signed   By: Aletta Edouard M.D.   On: 08/07/2020 17:01     LOS: 1 day   Antonieta Pert, MD [facility]  08/08/2020, 8:00 AM

## 2020-08-08 NOTE — Progress Notes (Signed)
Lab notified me of critical Hgb 6.8. Saw the doctor was already putting in orders and passed along to day shift nurse Ranger, RN.

## 2020-08-08 NOTE — Progress Notes (Signed)
@   2007 Floor RN Blanch [employer] A. made aware patient will not be done until tomorrow.

## 2020-08-08 NOTE — Progress Notes (Signed)
[facility] Progress Note   Subjective:   Feeling relatively ok just with ongoing LE edema.  No emesis; tolerating po intake ok.   Objective Vitals:   08/07/20 2108 08/07/20 2153 08/07/20 2355 08/08/20 0356  BP: (!) 153/107  (!) 151/98 (!) 153/96  Pulse: 73  72 79  Resp: '18  16 16  '$ Temp: 98.1 F (36.7 C)  98.2 F (36.8 C) 98.1 F (36.7 C)  TempSrc: Oral  Oral Oral  SpO2: 99%  95% 94%  Weight:  112 kg    Height:  6' (1.829 m)     Physical Exam General: comfortable flat in bed Heart: RRR no rub Lungs: normal WOB on RA Abdomen: soft Extremities:3+ edema to hips Dialysis Access:  [facility] c/d/i  Additional Objective Labs: Basic Metabolic Panel: Recent Labs  Lab 08/06/20 2012 08/07/20 0258 08/07/20 1806 08/08/20 0534  NA 138 136  --  139  K 5.2* 4.9  --  5.1  CL 108 107  --  109  CO2 18* 15*  --  18*  GLUCOSE 109* 151*  --  106*  BUN 89* 91*  --  91*  CREATININE 9.90* 9.77*  --  10.32*  CALCIUM 7.4* 7.6*  --  7.3*  PHOS  --  6.5* 7.0*  --    Liver Function Tests: Recent Labs  Lab 08/06/20 2012  AST 20  ALT 39  ALKPHOS 113  BILITOT 0.5  PROT 6.0*  ALBUMIN 2.3*   Recent Labs  Lab 08/06/20 2012  LIPASE 23   CBC: Recent Labs  Lab 08/06/20 2012 08/07/20 0258  WBC 5.9 6.5  HGB 7.0* 7.8*  HCT 22.2* 25.9*  MCV 83.8 86.3  PLT 320 336   Blood Culture    Component Value Date/Time   SDES ABSCESS FINGER LEFT 07/16/2014 2000   SDES ABSCESS FINGER LEFT 07/16/2014 2000   SPECREQUEST NONE 07/16/2014 2000   SPECREQUEST NONE 07/16/2014 2000   CULT  07/16/2014 2000    NO ANAEROBES ISOLATED Performed at [place]  07/16/2014 2000    MODERATE STREPTOCOCCUS GROUP F Performed at [place] 07/22/2014 FINAL 07/16/2014 2000   REPTSTATUS 07/19/2014 FINAL 07/16/2014 2000    Cardiac Enzymes: No results for input(s): CKTOTAL, CKMB, CKMBINDEX, TROPONINI in the last 168 hours. CBG: Recent Labs  Lab  08/07/20 1145 08/07/20 1656 08/07/20 2141 08/07/20 2352 08/08/20 0353  GLUCAP 82 100* 94 148* 108*   Iron Studies: No results for input(s): IRON, TIBC, TRANSFERRIN, FERRITIN in the last 72 hours. '@lablastinr3'$ @ Studies/Results: DG Chest 2 View  Result Date: 08/07/2020 CLINICAL DATA:  Leg swelling EXAM: CHEST - 2 VIEW COMPARISON:  March 10, 2020 FINDINGS: Again noted is significant cardiomegaly which appears to have increased from prior study. The bronchi are splayed as before. Again noted is vascular congestion with developing interstitial edema. There are small bilateral pleural effusions. No pneumothorax. IMPRESSION: 1. Worsening cardiomegaly. An underlying pericardial effusion is not excluded. 2. Vascular congestion with interstitial edema and small bilateral pleural effusions. Electronically Signed   By: Constance Holster M.D.   On: 08/07/2020 01:05   IR US Guide Vasc Access Right  Result Date: 08/07/2020 CLINICAL DATA:  Renal failure and need for tunneled hemodialysis catheter to begin hemodialysis treatment. EXAM: TUNNELED CENTRAL VENOUS HEMODIALYSIS CATHETER PLACEMENT WITH ULTRASOUND AND FLUOROSCOPIC GUIDANCE ANESTHESIA/SEDATION: 2.0 mg IV Versed; 50 mcg IV Fentanyl. Total Moderate Sedation Time:   28 minutes. The patient's level  of consciousness and physiologic status were continuously monitored during the procedure by Radiology nursing. MEDICATIONS: 2 g IV Ancef. FLUOROSCOPY TIME:  36 seconds. 6.1 mGy. PROCEDURE: The procedure, risks, benefits, and alternatives were explained to the patient. Questions regarding the procedure were encouraged and answered. The patient understands and consents to the procedure. A timeout was performed prior to initiating the procedure. Ultrasound was utilized to confirm patency of the right internal jugular vein. The right neck and chest were prepped with chlorhexidine in a sterile fashion, and a sterile drape was applied covering the operative field. Maximum  barrier sterile technique with sterile gowns and gloves were used for the procedure. Local anesthesia was provided with 1% lidocaine. After creating a small venotomy incision, a 21 gauge needle was advanced into the right internal jugular vein under direct, real-time ultrasound guidance. Ultrasound image documentation was performed. After securing guidewire access, an 8 Fr dilator was placed. A J-wire was kinked to measure appropriate catheter length. A Palindrome tunneled hemodialysis catheter measuring 23 cm from tip to cuff was chosen for placement. This was tunneled in a retrograde fashion from the chest wall to the venotomy incision. At the venotomy, serial dilatation was performed and a 15 Fr peel-away sheath was placed over a guidewire. The catheter was then placed through the sheath and the sheath removed. Final catheter positioning was confirmed and documented with a fluoroscopic spot image. The catheter was aspirated, flushed with saline, and injected with appropriate volume heparin dwells. The venotomy incision was closed with subcuticular 4-0 Vicryl. Dermabond was applied to the incision. The catheter exit site was secured with 0-Prolene retention sutures. COMPLICATIONS: None.  No pneumothorax. FINDINGS: After catheter placement, the tip lies in the right atrium. The catheter aspirates normally and is ready for immediate use. IMPRESSION: Placement of tunneled hemodialysis catheter via the right internal jugular vein. The catheter tip lies in the right atrium. The catheter is ready for immediate use. Electronically Signed   By: Aletta Edouard M.D.   On: 08/07/2020 17:01   IR TUNNELED CENTRAL VENOUS CATHETER PLACEMENT  Result Date: 08/07/2020 CLINICAL DATA:  Renal failure and need for tunneled hemodialysis catheter to begin hemodialysis treatment. EXAM: TUNNELED CENTRAL VENOUS HEMODIALYSIS CATHETER PLACEMENT WITH ULTRASOUND AND FLUOROSCOPIC GUIDANCE ANESTHESIA/SEDATION: 2.0 mg IV Versed; 50 mcg IV  Fentanyl. Total Moderate Sedation Time:   28 minutes. The patient's level of consciousness and physiologic status were continuously monitored during the procedure by Radiology nursing. MEDICATIONS: 2 g IV Ancef. FLUOROSCOPY TIME:  36 seconds. 6.1 mGy. PROCEDURE: The procedure, risks, benefits, and alternatives were explained to the patient. Questions regarding the procedure were encouraged and answered. The patient understands and consents to the procedure. A timeout was performed prior to initiating the procedure. Ultrasound was utilized to confirm patency of the right internal jugular vein. The right neck and chest were prepped with chlorhexidine in a sterile fashion, and a sterile drape was applied covering the operative field. Maximum barrier sterile technique with sterile gowns and gloves were used for the procedure. Local anesthesia was provided with 1% lidocaine. After creating a small venotomy incision, a 21 gauge needle was advanced into the right internal jugular vein under direct, real-time ultrasound guidance. Ultrasound image documentation was performed. After securing guidewire access, an 8 Fr dilator was placed. A J-wire was kinked to measure appropriate catheter length. A Palindrome tunneled hemodialysis catheter measuring 23 cm from tip to cuff was chosen for placement. This was tunneled in a retrograde fashion from the chest wall to  the venotomy incision. At the venotomy, serial dilatation was performed and a 15 Fr peel-away sheath was placed over a guidewire. The catheter was then placed through the sheath and the sheath removed. Final catheter positioning was confirmed and documented with a fluoroscopic spot image. The catheter was aspirated, flushed with saline, and injected with appropriate volume heparin dwells. The venotomy incision was closed with subcuticular 4-0 Vicryl. Dermabond was applied to the incision. The catheter exit site was secured with 0-Prolene retention sutures.  COMPLICATIONS: None.  No pneumothorax. FINDINGS: After catheter placement, the tip lies in the right atrium. The catheter aspirates normally and is ready for immediate use. IMPRESSION: Placement of tunneled hemodialysis catheter via the right internal jugular vein. The catheter tip lies in the right atrium. The catheter is ready for immediate use. Electronically Signed   By: Aletta Edouard M.D.   On: 08/07/2020 17:01   Medications: . furosemide Stopped (08/07/20 1800)   . carvedilol  6.25 mg Oral BID WC  . Chlorhexidine Gluconate Cloth  6 each Topical Q0600  . heparin  5,000 Units Subcutaneous Q8H  . insulin aspart  0-6 Units Subcutaneous Q4H  . sodium bicarbonate  650 mg Oral TID    Assessment/Plan **new ESRD:  Follows with Dr. Daiva Huge but no appt since 02/2020 due to missed appts.  This appears to be progressive renal decline to point of needing to initiate dialysis > ESRD.  S/p [facility] placement 08/08/20.  HD #1 tomorrow due to dialysis census.  Will consult [facility] for perm access today, appreciate.  **volume overload: torsemide 100 BID and metolazone 5 daily, UF with HD; if UOP inadequate will switch back to IV furosemide.  **HTN urgency:  In setting of missed meds.  Improved BP now that meds resumed. Has PRN labetalol.  Will follow for additional med needs but I suspect as we diurese his BP wil improve.   **Metabolic acidosis: po bicarb until HD.  **Anemia:  Hb 7.8 which is recent Baseline.  Iron deficient > will give course of IV iron.  Hemoccult.  Start ESA in a few days after iron on board.   **BMM: phos 7.0, PTH pending.  Renal diet and sevelamer 1 TIDAC.   **DM: per primary.  Jannifer Hick MD 08/08/2020, 7:18 AM  [facility] Pager: [phone]

## 2020-08-08 NOTE — Progress Notes (Signed)
Bilateral upper vein map completed    Please see CV Proc for preliminary results.   Vonzell Schlatter, RVT

## 2020-08-08 NOTE — Plan of Care (Signed)
Patient admitted for initiation of HD treatment with progressively worse CKD. Patient has a right tunneled subclavian catheter in place by IR that will be used for dialysis. Patient alert and oriented and agrees with POC. Bilateral lower extremity edema observed. Gave IV labetalol as ordered PRN and per administration parameters. BP has slightly improved. Up ad lib and has voided once so far on my shift. NAD at this time. Says that he is sad and trying to cope with everything but does not want to die. Will continue to monitor and continue current POC.

## 2020-08-08 NOTE — Consult Note (Signed)
Hospital Consult    Reason for Consult:  dialysis access Requesting Physician:  Johnney Ou MRN #:  [id number]  History of Present Illness: This is a 35 y.o. male with PMH significant for hypertension, DM, CKD 5 who has had progressive swelling, SOB and nausea now in need of dialysis access.  Pt had tunneled dialysis catheter placed yesterday by IR.  [facility] has been consulted for permanent HD access.  Vein mapping has been ordered and is pending. He is right dominant.  He feels some better since admission. Hasn't ambulated much.  Past surgical history is significant for ORIF of left wrist/forearm at age 16.  The pt is not on a statin for cholesterol management.  The pt is not on a daily aspirin.   Other AC:  Sq heparin for DVT prophylaxis The pt is on BB for hypertension.   The pt is diabetic.   Tobacco hx:  never  Past Medical History:  Diagnosis Date  . Diabetes mellitus   . High cholesterol   . Hypertension     Past Surgical History:  Procedure Laterality Date  . FRACTURE SURGERY    . I & D EXTREMITY Left 07/16/2014   Procedure: IRRIGATION AND DEBRIDEMENT EXTREMITY/LEFT INDEX FINGER;  Surgeon: Leanora Cover, MD;  Location: [place];  Service: Orthopedics;  Laterality: Left;  . IR PERC TUN PERIT CATH WO PORT S&I Dartha Lodge  08/07/2020  . IR US GUIDE VASC ACCESS RIGHT  08/07/2020    Allergies  Allergen Reactions  . Amlodipine Nausea And Vomiting and Other (See Comments)    Patient was taking Amlodipine and Hydralazine at the same time, so the reactions came from one of the 2: Lethargy and an all-over feeling of NOT feeling well (also)  . Hydralazine Nausea And Vomiting and Other (See Comments)    Patient was taking Hydralazine AND Amlodipine at the same time, so the reactions came from one of the 2: Lethargy and an all-over feeling of NOT feeling well (also)    Prior to Admission medications   Medication Sig Start Date End Date Taking? Authorizing Provider  insulin detemir (LEVEMIR) 100  UNIT/ML injection Inject 0.15 mLs (15 Units total) into the skin daily. Patient taking differently: Inject 10 Units into the skin daily. 05/10/20  Yes Damita Lack, MD    Social History   Socioeconomic History  . Marital status: Single    Spouse name: Not on file  . Number of children: Not on file  . Years of education: Not on file  . Highest education level: Not on file  Occupational History  . Not on file  Tobacco Use  . Smoking status: Never Smoker  . Smokeless tobacco: Never Used  Vaping Use  . Vaping Use: Never used  Substance and Sexual Activity  . Alcohol use: Yes  . Drug use: No  . Sexual activity: Yes  Other Topics Concern  . Not on file  Social History Narrative  . Not on file   Social Determinants of Health   Financial Resource Strain: Not on file  Food Insecurity: Not on file  Transportation Needs: Not on file  Physical Activity: Not on file  Stress: Not on file  Social Connections: Not on file  Intimate Partner Violence: Not on file     Family History  Problem Relation Age of Onset  . Cancer Mother   . Stroke Father   . Diabetes Father   . Hypertension Father     ROS: '[x]'$  Positive   '[ ]'$   Negative   '[ ]'$  All sytems reviewed and are negative  Cardiac: '[x]'$  dyspnea on exertion  Vascular: '[x]'$  swelling in legs  Pulmonary: '[]'$  asthma/wheezing '[]'$  home O2  Neurologic: '[]'$  hx of CVA '[]'$  mini stroke   Hematologic: '[]'$  hx of cancer  Endocrine:   '[x]'$  diabetes '[]'$  thyroid disease  GI '[]'$  GERD  GU: '[x]'$  CKD/renal failure '[]'$  HD--'[]'$  M/W/F or '[]'$  T/T/S  Psychiatric: '[]'$  anxiety '[]'$  depression  Musculoskeletal: '[]'$  arthritis '[]'$  joint pain  Integumentary: '[]'$  rashes '[]'$  ulcers  Constitutional: '[]'$  fever  '[]'$  chills   Physical Examination  Vitals:   08/08/20 0356 08/08/20 0828  BP: (!) 153/96 (!) 165/101  Pulse: 79 76  Resp: 16 18  Temp: 98.1 F (36.7 C) 98.3 F (36.8 C)  SpO2: 94% 100%   Body mass index is 33.49 kg/m.  General:   WDWN in NAD Gait: Not observed. He is currently OOB standing by bed without dyspnea HENT: WNL, normocephalic Pulmonary: normal non-labored breathing Cardiac: irregular, without Murmur Skin: with rashes (dorsum left foot) Vascular Exam/Pulses: 2+ radial and brachial pulse, 1+ ulnar pulse of left Extremities: without ischemic changes, without Gangrene , without cellulitis; without open wounds Musculoskeletal: no muscle wasting or atrophy. 5/5 left hand grip strength. He has good caliber veins at [facility]  Neurologic: A&O X 3; speech is fluent/normal Psychiatric:  The pt has Normal affect.   CBC    Component Value Date/Time   WBC 5.3 08/08/2020 0534   RBC 2.62 (L) 08/08/2020 0534   HGB 6.8 (LL) 08/08/2020 0534   HCT 22.4 (L) 08/08/2020 0534   PLT 311 08/08/2020 0534   MCV 85.5 08/08/2020 0534   MCH 26.0 08/08/2020 0534   MCHC 30.4 08/08/2020 0534   RDW 16.3 (H) 08/08/2020 0534   LYMPHSABS 1.2 03/01/2020 0545   MONOABS 0.6 03/01/2020 0545   EOSABS 0.2 03/01/2020 0545   BASOSABS 0.0 03/01/2020 0545    BMET    Component Value Date/Time   NA 139 08/08/2020 0534   K 5.1 08/08/2020 0534   CL 109 08/08/2020 0534   CO2 18 (L) 08/08/2020 0534   GLUCOSE 106 (H) 08/08/2020 0534   BUN 91 (H) 08/08/2020 0534   CREATININE 10.32 (H) 08/08/2020 0534   CALCIUM 7.3 (L) 08/08/2020 0534   GFRNONAA 6 (L) 08/08/2020 0534   GFRAA 19 (L) 03/10/2020 1410    COAGS: Lab Results  Component Value Date   INR 1.1 05/09/2020   INR 1.0 02/29/2020   INR 1.0 10/12/2007     Non-Invasive Vascular Imaging:   BUE Vein Mapping on 08/08/2020: pending   ASSESSMENT/PLAN: This is a 35 y.o. male with CKD 5 now AKI in need of permanent dialysis access   -pt is right hand dominant -will likely plan for left AV fistula on Monday -will restrict left upper extremity -The risks and rationale of the procedure were explained. Risks, including, but not limited to, bleeding infection, steal syndrome, nerve damage,  failure to mature, thrombosis, cardiac or respiratory event or need for additional procedure(s) discussed.    Dr. Stanford Breed, vascular surgeon on-cal, will evaluate the patient later today and make further recommendations  Barbie Banner, PA-C [facility] [phone] \

## 2020-08-09 ENCOUNTER — Inpatient Hospital Stay (HOSPITAL_COMMUNITY): Payer: Medicaid Other

## 2020-08-09 LAB — BPAM RBC
Blood Product Expiration Date: 202202262359
ISSUE DATE / TIME: 202201281613
Unit Type and Rh: 5100

## 2020-08-09 LAB — BASIC METABOLIC PANEL
Anion gap: 12 (ref 5–15)
BUN: 89 mg/dL — ABNORMAL HIGH (ref 6–20)
CO2: 18 mmol/L — ABNORMAL LOW (ref 22–32)
Calcium: 7.1 mg/dL — ABNORMAL LOW (ref 8.9–10.3)
Chloride: 109 mmol/L (ref 98–111)
Creatinine, Ser: 10.18 mg/dL — ABNORMAL HIGH (ref 0.61–1.24)
GFR, Estimated: 6 mL/min — ABNORMAL LOW (ref >60)
Glucose, Bld: 114 mg/dL — ABNORMAL HIGH (ref 70–99)
Potassium: 4.8 mmol/L (ref 3.5–5.1)
Sodium: 139 mmol/L (ref 135–145)

## 2020-08-09 LAB — TYPE AND SCREEN
ABO/RH(D): O POS
Antibody Screen: NEGATIVE
Unit division: 0

## 2020-08-09 LAB — GLUCOSE, CAPILLARY
Glucose-Capillary: 111 mg/dL — ABNORMAL HIGH (ref 70–99)
Glucose-Capillary: 92 mg/dL (ref 70–99)
Glucose-Capillary: 98 mg/dL (ref 70–99)
Glucose-Capillary: 116 mg/dL — ABNORMAL HIGH (ref 70–99)

## 2020-08-09 LAB — COMPREHENSIVE METABOLIC PANEL
ALT: 12 U/L (ref 0–44)
AST: 11 U/L — ABNORMAL LOW (ref 15–41)
Albumin: 2.1 g/dL — ABNORMAL LOW (ref 3.5–5.0)
Alkaline Phosphatase: 99 U/L (ref 38–126)
Anion gap: 12 (ref 5–15)
BUN: 68 mg/dL — ABNORMAL HIGH (ref 6–20)
CO2: 22 mmol/L (ref 22–32)
Calcium: 6.9 mg/dL — ABNORMAL LOW (ref 8.9–10.3)
Chloride: 103 mmol/L (ref 98–111)
Creatinine, Ser: 8.53 mg/dL — ABNORMAL HIGH (ref 0.61–1.24)
GFR, Estimated: 8 mL/min — ABNORMAL LOW (ref >60)
Glucose, Bld: 121 mg/dL — ABNORMAL HIGH (ref 70–99)
Potassium: 4.5 mmol/L (ref 3.5–5.1)
Sodium: 137 mmol/L (ref 135–145)
Total Bilirubin: 0.5 mg/dL (ref 0.3–1.2)
Total Protein: 5.4 g/dL — ABNORMAL LOW (ref 6.5–8.1)

## 2020-08-09 LAB — CBC
HCT: 22.6 % — ABNORMAL LOW (ref 39.0–52.0)
Hemoglobin: 7.3 g/dL — ABNORMAL LOW (ref 13.0–17.0)
MCH: 26.9 pg (ref 26.0–34.0)
MCHC: 32.3 g/dL (ref 30.0–36.0)
MCV: 83.4 fL (ref 80.0–100.0)
Platelets: 305 10*3/uL (ref 150–400)
RBC: 2.71 MIL/uL — ABNORMAL LOW (ref 4.22–5.81)
RDW: 16.3 % — ABNORMAL HIGH (ref 11.5–15.5)
WBC: 6.8 10*3/uL (ref 4.0–10.5)
nRBC: 0 % (ref 0.0–0.2)

## 2020-08-09 LAB — PTH, INTACT AND CALCIUM
Calcium, Total (PTH): 7.2 mg/dL — ABNORMAL LOW (ref 8.7–10.2)
PTH: 113 pg/mL — ABNORMAL HIGH (ref 15–65)

## 2020-08-09 LAB — TROPONIN I (HIGH SENSITIVITY): Troponin I (High Sensitivity): 5 ng/L (ref <18)

## 2020-08-09 MED ORDER — LIDOCAINE HCL (PF) 1 % IJ SOLN: 5.0000 mL | INTRAMUSCULAR | Status: DC | PRN

## 2020-08-09 MED ORDER — HEPARIN SODIUM (PORCINE) 1000 UNIT/ML IJ SOLN
INTRAMUSCULAR | Status: AC
  Filled 2020-08-09: qty 1

## 2020-08-09 MED ORDER — SODIUM CHLORIDE 0.9 % IV SOLN: 100.0000 mL | INTRAVENOUS | Status: DC | PRN

## 2020-08-09 MED ORDER — HEPARIN SODIUM (PORCINE) 1000 UNIT/ML IJ SOLN
INTRAMUSCULAR | Status: AC
  Administered 2020-08-09: 1000 [IU] via INTRAVENOUS_CENTRAL
  Filled 2020-08-09: qty 3

## 2020-08-09 MED ORDER — ALTEPLASE 2 MG IJ SOLR: 2.0000 mg | Freq: Once | INTRAMUSCULAR | Status: DC | PRN

## 2020-08-09 MED ORDER — LOSARTAN POTASSIUM 50 MG PO TABS
25.0000 mg | ORAL_TABLET | Freq: Every day | ORAL | Status: DC
  Administered 2020-08-09 – 2020-08-12 (×3): 25 mg via ORAL
  Filled 2020-08-09 (×4): qty 1

## 2020-08-09 MED ORDER — PENTAFLUOROPROP-TETRAFLUOROETH EX AERO: 1.0000 "application " | INHALATION_SPRAY | CUTANEOUS | Status: DC | PRN

## 2020-08-09 MED ORDER — NITROGLYCERIN 0.4 MG SL SUBL
0.4000 mg | SUBLINGUAL_TABLET | SUBLINGUAL | Status: AC | PRN
  Administered 2020-08-09 (×2): 0.4 mg via SUBLINGUAL
  Filled 2020-08-09 (×2): qty 1

## 2020-08-09 MED ORDER — HEPARIN SODIUM (PORCINE) 1000 UNIT/ML DIALYSIS: 1000.0000 [IU] | INTRAMUSCULAR | Status: DC | PRN

## 2020-08-09 MED ORDER — LIDOCAINE-PRILOCAINE 2.5-2.5 % EX CREA: 1.0000 "application " | TOPICAL_CREAM | CUTANEOUS | Status: DC | PRN

## 2020-08-09 MED ORDER — FUROSEMIDE 10 MG/ML IJ SOLN
160.0000 mg | Freq: Two times a day (BID) | INTRAVENOUS | Status: DC
  Administered 2020-08-09 – 2020-08-13 (×8): 160 mg via INTRAVENOUS
  Filled 2020-08-09 (×6): qty 16
  Filled 2020-08-09: qty 10
  Filled 2020-08-09 (×5): qty 16

## 2020-08-09 NOTE — Progress Notes (Signed)
[facility] Progress Note   Subjective:   1st HD today.  Has had RLS symptoms lately - we discussed his low iron.  No new issues.  UOP only 392m yesterday but incomplete collection - asked he do full collection.   Objective Vitals:   08/09/20 0409 08/09/20 0600 08/09/20 0815 08/09/20 0826  BP: (!) 170/108 (!) 170/109 (!) 189/112 (!) 186/113  Pulse: 72  76 76  Resp: '18  12 13  '$ Temp: 98.6 F (37 C)  98.4 F (36.9 C)   TempSrc: Oral  Oral   SpO2: 98%  94%   Weight:   112 kg   Height:       Physical Exam General: comfortable flat in bed Heart: RRR no rub Lungs: normal WOB on RA Abdomen: soft Extremities:3+ edema to hips bit some improvement [facility]  Additional Objective Labs: Basic Metabolic Panel: Recent Labs  Lab 08/07/20 0258 08/07/20 1806 08/08/20 0534 08/09/20 0228  NA 136  --  139 139  K 4.9  --  5.1 4.8  CL 107  --  109 109  CO2 15*  --  18* 18*  GLUCOSE 151*  --  106* 114*  BUN 91*  --  91* 89*  CREATININE 9.77*  --  10.32* 10.18*  CALCIUM 7.6*  --  7.3*  7.2* 7.1*  PHOS 6.5* 7.0*  --   --    Liver Function Tests: Recent Labs  Lab 08/06/20 2012  AST 20  ALT 39  ALKPHOS 113  BILITOT 0.5  PROT 6.0*  ALBUMIN 2.3*   Recent Labs  Lab 08/06/20 2012  LIPASE 23   CBC: Recent Labs  Lab 08/06/20 2012 08/07/20 0258 08/08/20 0534 08/08/20 2150 08/09/20 0228  WBC 5.9 6.5 5.3  --  6.8  HGB 7.0* 7.8* 6.8* 7.5* 7.3*  HCT 22.2* 25.9* 22.4* 24.1* 22.6*  MCV 83.8 86.3 85.5  --  83.4  PLT 320 336 311  --  305   Blood Culture    Component Value Date/Time   SDES ABSCESS FINGER LEFT 07/16/2014 2000   SDES ABSCESS FINGER LEFT 07/16/2014 2000   SPECREQUEST NONE 07/16/2014 2000   SPECREQUEST NONE 07/16/2014 2000   CULT  07/16/2014 2000    [place] A[place]EROBES ISOLATED [place] 07[place]16[place] 2000    MODERATE STRE[place]OCOCCUS GROUP F [place] 07/[place]/2014 FINAL 07/16/2014 2000   REPTSTATUS 07/19/2014 FINAL 07/16/2014 2000    Cardiac Enzymes: No results for input(s): CKTOTAL, CKMB, CKMBINDEX, TROPONINI in the last 168 hours. CBG: Recent Labs  Lab 08/08/20 1220 08/08/20 1630 08/08/20 2045 08/08/20 2330 08/09/20 0402  GLUCAP 122* 141* 94 108* 111*   Iron Studies:  Recent Labs    08/08/20 0534  IRON 42*  TIBC 231*  FERRITIN 328   '@lablastinr3'$ @ Studies/Results: IR [place] Vasc Access Right  Result Date: 08/07/2020 CLINICAL DATA:  Renal failure and need for tunneled hemodialysis catheter to begin hemodialysis treatment. EXAM: TUNNELED CENTRAL VENOUS HEMODIALYSIS CATHETER PLACEMENT WITH ULTRASOUND AND FLUOROSCOPIC GUIDANCE ANESTHESIA/SEDATION: 2.0 mg IV Versed; 50 mcg IV Fentanyl. Total Moderate Sedation Time:   28 minutes. The patient's level of consciousness and physiologic status were continuously monitored during the procedure by Radiology nursing. MEDICATIONS: 2 g IV Ancef. FLUOROSCOPY TIME:  36 seconds. 6.1 mGy. PROCEDURE: The procedure, risks, benefits, and alternatives were explained to the patient. Questions regarding the procedure were encouraged and answered.  The patient understands and consents to the procedure. A timeout was performed prior to initiating the procedure. Ultrasound was utilized to confirm patency of the right internal jugular vein. The right neck and chest were prepped with chlorhexidine in a sterile fashion, and a sterile drape was applied covering the operative field. Maximum barrier sterile technique with sterile gowns and gloves were used for the procedure. Local anesthesia was provided with 1% lidocaine. After creating a small venotomy incision, a 21 gauge needle was advanced into the right internal jugular vein under direct, real-time ultrasound guidance. Ultrasound image documentation was performed. After securing guidewire access, an 8 Fr dilator was placed. A J-wire was kinked to measure  appropriate catheter length. A Palindrome tunneled hemodialysis catheter measuring 23 cm from tip to cuff was chosen for placement. This was tunneled in a retrograde fashion from the chest wall to the venotomy incision. At the venotomy, serial dilatation was performed and a 15 Fr peel-away sheath was placed over a guidewire. The catheter was then placed through the sheath and the sheath removed. Final catheter positioning was confirmed and documented with a fluoroscopic spot image. The catheter was aspirated, flushed with saline, and injected with appropriate volume heparin dwells. The venotomy incision was closed with subcuticular 4-0 Vicryl. Dermabond was applied to the incision. The catheter exit site was secured with 0-Prolene retention sutures. COMPLICATIONS: None.  No pneumothorax. FINDINGS: After catheter placement, the tip lies in the right atrium. The catheter aspirates normally and is ready for immediate use. IMPRESSION: Placement of tunneled hemodialysis catheter via the right internal jugular vein. The catheter tip lies in the right atrium. The catheter is ready for immediate use. Electronically Signed   By: Aletta Edouard M.D.   On: 08/07/2020 17:01   VAS [place] UPPER EXT VEIN MAPPING (PRE-OP AVF)  Result Date: 08/08/2020 UPPER EXTREMITY VEIN MAPPING  Indications: Pre-access. Comparison Study: No previous Performing Technologist: Vonzell Schlatter AVF  Examination Guidelines: A complete evaluation includes B-mode imaging, spectral Doppler, color Doppler, and power Doppler as needed of all accessible portions of each vessel. Bilateral testing is considered an integral part of a complete examination. Limited examinations for reoccurring indications may be performed as noted. +-----------------+-------------+----------+---------+ Right Cephalic   Diameter (cm)Depth (cm)Findings  +-----------------+-------------+----------+---------+ Shoulder             0.43        0.44   branching  +-----------------+-------------+----------+---------+ Prox upper arm       0.35        0.38             +-----------------+-------------+----------+---------+ Mid upper arm        0.37        0.28             +-----------------+-------------+----------+---------+ Dist upper arm       0.37        0.23   branching +-----------------+-------------+----------+---------+ Antecubital fossa    0.70        0.23             +-----------------+-------------+----------+---------+ Prox forearm         0.41        0.27             +-----------------+-------------+----------+---------+ Mid forearm          0.32        0.23             +-----------------+-------------+----------+---------+ Dist forearm  0.33        0.26   branching +-----------------+-------------+----------+---------+ Wrist                0.33        0.21   branching +-----------------+-------------+----------+---------+ +-----------------+-------------+----------+---------+ Left Cephalic    Diameter (cm)Depth (cm)Findings  +-----------------+-------------+----------+---------+ Shoulder             0.40        0.57             +-----------------+-------------+----------+---------+ Prox upper arm       0.32        0.27             +-----------------+-------------+----------+---------+ Mid upper arm        0.26        0.29   branching +-----------------+-------------+----------+---------+ Dist upper arm       0.27        0.24             +-----------------+-------------+----------+---------+ Antecubital fossa    0.30        0.18             +-----------------+-------------+----------+---------+ Prox forearm         0.30        0.18             +-----------------+-------------+----------+---------+ Mid forearm          0.33        0.22             +-----------------+-------------+----------+---------+ Dist forearm         0.23        0.23   branching  +-----------------+-------------+----------+---------+ Wrist                0.33        0.30             +-----------------+-------------+----------+---------+ *See table(s) above for measurements and observations.  Diagnosing physician: Jamelle Haring Electronically signed by Jamelle Haring on 08/08/2020 at 5:15:08 PM.    Final    IR TUNNELED CENTRAL VENOUS CATHETER PLACEMENT  Result Date: 08/07/2020 CLINICAL DATA:  Renal failure and need for tunneled hemodialysis catheter to begin hemodialysis treatment. EXAM: TUNNELED CENTRAL VENOUS HEMODIALYSIS CATHETER PLACEMENT WITH ULTRASOUND AND FLUOROSCOPIC GUIDANCE ANESTHESIA/SEDATION: 2.0 mg IV Versed; 50 mcg IV Fentanyl. Total Moderate Sedation Time:   28 minutes. The patient's level of consciousness and physiologic status were continuously monitored during the procedure by Radiology nursing. MEDICATIONS: 2 g IV Ancef. FLUOROSCOPY TIME:  36 seconds. 6.1 mGy. PROCEDURE: The procedure, risks, benefits, and alternatives were explained to the patient. Questions regarding the procedure were encouraged and answered. The patient understands and consents to the procedure. A timeout was performed prior to initiating the procedure. Ultrasound was utilized to confirm patency of the right internal jugular vein. The right neck and chest were prepped with chlorhexidine in a sterile fashion, and a sterile drape was applied covering the operative field. Maximum barrier sterile technique with sterile gowns and gloves were used for the procedure. Local anesthesia was provided with 1% lidocaine. After creating a small venotomy incision, a 21 gauge needle was advanced into the right internal jugular vein under direct, real-time ultrasound guidance. Ultrasound image documentation was performed. After securing guidewire access, an 8 Fr dilator was placed. A J-wire was kinked to measure appropriate catheter length. A Palindrome tunneled hemodialysis catheter measuring 23 cm from tip to  cuff was chosen for placement. This was tunneled in  a retrograde fashion from the chest wall to the venotomy incision. At the venotomy, serial dilatation was performed and a 15 Fr peel-away sheath was placed over a guidewire. The catheter was then placed through the sheath and the sheath removed. Final catheter positioning was confirmed and documented with a fluoroscopic spot image. The catheter was aspirated, flushed with saline, and injected with appropriate volume heparin dwells. The venotomy incision was closed with subcuticular 4-0 Vicryl. Dermabond was applied to the incision. The catheter exit site was secured with 0-Prolene retention sutures. COMPLICATIONS: None.  No pneumothorax. FINDINGS: After catheter placement, the tip lies in the right atrium. The catheter aspirates normally and is ready for immediate use. IMPRESSION: Placement of tunneled hemodialysis catheter via the right internal jugular vein. The catheter tip lies in the right atrium. The catheter is ready for immediate use. Electronically Signed   By: Aletta Edouard M.D.   On: 08/07/2020 17:01   Medications: . sodium chloride    . sodium chloride    . ferric gluconate (FERRLECIT/NULECIT) IV     . carvedilol  6.25 mg Oral BID WC  . Chlorhexidine Gluconate Cloth  6 each Topical Q0600  . heparin  5,000 Units Subcutaneous Q8H  . insulin aspart  0-6 Units Subcutaneous Q4H  . metolazone  5 mg Oral Daily  . sevelamer carbonate  800 mg Oral TID WC  . sodium bicarbonate  650 mg Oral TID  . torsemide  100 mg Oral BID    Assessment/Plan **new ESRD:  Follows with Dr. Daiva Huge but no appt since 02/2020 due to missed appts.  This appears to be progressive renal decline to point of needing to initiate dialysis > ESRD.  S/p [facility] placement 08/08/20.  HD #1 today. VVS for perm access - saw and plan AVF 08/11/20, appreciate.  **volume overload: torsemide 100 BID and metolazone 5 daily, UF with HD; if UOP inadequate will switch back to IV  furosemide. Reiterated need for I/Os with him.   **HTN urgency:  Remains high.  Should improve with UF but in the meantime will add losartan 25 daily - K has been ok and he has intolerance to hydralazine and amlodipine; cont coreg.   **Metabolic acidosis: cont bicarb until HD fully on  **Anemia:  Hb 7.3 which is recent Baseline.  Iron deficient > will give course of IV iron.  Hemoccult.  Start ESA in a few days after iron on board.   **BMM: phos 7.0, PTH pending.  Renal diet and sevelamer 1 TIDAC.   **DM: per primary.  **RLS: replete iron  Jannifer Hick MD 08/09/2020, 9:28 AM  [facility] Pager: [phone]

## 2020-08-09 NOTE — Progress Notes (Signed)
Bladder scanned for >648 ml patient refuse to be I&O cath stated that he will urinate I told him not to empty the urinal so that out put could be measured. Will continue to monitor. Arthor Captain LPN

## 2020-08-09 NOTE — Plan of Care (Signed)

## 2020-08-09 NOTE — Progress Notes (Signed)
[facility]  PROGRESS NOTE    [name]  [id number] DOB: September 30, 1985 DOA: 08/06/2020 PCP: Patient, No Pcp Per   Chief Complaint  Patient presents with  . Leg Swelling  . 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  Hyperkalemia 5.2 metabolic acidosis BUN 89 creatinine 9.9, anemia COVID-19 negative BNP elevated 2067. Patient placed on IV Lasix and admitted. Nephrology was consulted Patient continued ON diuresis  Subjective: Seen this morning after dialysis-tolerated well. Reports he is about to go and pee Has had urine retention but refusing in and out cath this am.  Assessment & Plan:  New ESRD:Followed by Dr. Candiss Norse but had missed appointments since 8/21 now with progressive decline appreciate nephrology input, TDC placed by IR  And s/p vein mapping and AVF planned 1/31.  Had dialysis first session today.  He tolerated well.  Volume overload due to progressive CKD D ESRD, continue oral torsemide/Zaroxylyn, continue dialysis as above.  Urine retention refusing in and out cath, add Flomax  Metabolic acidosis/uremic symptoms in the setting of #1 continue oral bicarb, improved at 18.  Monitor labs.  Anemia likely from chronic renal disease follow-up iron studies with low serum iron. H&H dropped to 6.8, s/p 1 unit PRBC 1/28 H&H improved to 7.5 again downtrending.  IV iron and ESA being planned by  nephrology.  Continue to monitor H&H. 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    Hypertension urgency in the setting of fluid overload and missed meds.  Blood pressure 140s to 170s, borderline controlled . Cont home Coreg 6.25 mg, now on diuresis.  Insulin-dependent diabetes mellitus hemoglobin A1c 6.11 April 2020, continue to monitor CBG  Morbid obesity BMI 33.  Will benefit with weight loss.  Hopefully weight  will improve after addressing fluid overload.    Nutrition: Diet Order            Diet renal/carb modified with fluid restriction Diet-HS Snack? Nothing; Fluid restriction: 1200 mL Fluid; Room service appropriate? Yes; Fluid consistency: Thin  Diet effective now                 Body mass index is 33.49 kg/m.  DVT prophylaxis: heparin injection 5,000 Units Start: 08/07/20 0600 Code Status:   Code Status: Full Code  Family Communication: plan of care discussed with patient at bedside.  Status is: Inpatient Remains inpatient appropriate because:Inpatient level of care appropriate due to severity of illness and For ongoing management of fluid overload CKD, anemia  Dispo: The patient is from: Home              Anticipated d/c is to: Home              Anticipated d/c date is:2-3 days              Patient currently is not medically stable to d/c.   Difficult to place patient No  Consultants:see note  Procedures:see note  Culture/Microbiology    Component Value Date/Time  SDES ABSCESS FINGER LEFT 07/16/2014 2000   SDES ABSCESS FINGER LEFT 07/16/2014 2000   SPECREQUEST NONE 07/16/2014 2000   [place] NONE 07/16/2014 2000   CULT  07/16/2014 2000    NO ANAEROBES ISOLATED Performed at [place]  07/16/2014 2000    MODERATE STREPTOCOCCUS GROUP F Performed at [place] 07/22/2014 FINAL 07/16/2014 2000   REPTSTATUS 07/19/2014 FINAL 07/16/2014 2000    Other culture-see note  Medications: Scheduled Meds: . carvedilol  6.25 mg  Oral BID WC  . Chlorhexidine Gluconate Cloth  6 each Topical Q0600  . heparin  5,000 Units Subcutaneous Q8H  . insulin aspart  0-6 Units Subcutaneous Q4H  . metolazone  5 mg Oral Daily  . sevelamer carbonate  800 mg Oral TID WC  . sodium bicarbonate  650 mg Oral TID  . torsemide  100 mg Oral BID   Continuous Infusions: . sodium chloride    . sodium chloride    . ferric gluconate (FERRLECIT/NULECIT) IV      Antimicrobials: Anti-infectives (From admission, onward)   Start     Dose/Rate Route Frequency Ordered Stop   08/07/20 1415  ceFAZolin (ANCEF) IVPB 2g/100 mL premix        2 g 200 mL/hr over 30 Minutes Intravenous To Radiology 08/07/20 1405 08/07/20 1649     Objective: Vitals: Today's Vitals   08/08/20 1900 08/08/20 2049 08/09/20 0409 08/09/20 0600  BP: (!) 157/101 (!) 154/92 (!) 170/108 (!) 170/109  Pulse: 80 78 72   Resp: '16 18 18   '$ Temp: 97.9 F (36.6 C) 98.7 F (37.1 C) 98.6 F (37 C)   TempSrc: Oral Oral Oral   SpO2: 100% 100% 98%   Weight:      Height:      PainSc:        Intake/Output Summary (Last 24 hours) at 08/09/2020 0815 Last data filed at 08/09/2020 0600 Gross per 24 hour  Intake 802 ml  Output 300 ml  Net 502 ml   Filed Weights   08/07/20 2153  Weight: 112 kg   Weight change:   Intake/Output from previous day: 01/28 0701 - 01/29 0700 In: 802 [P.O.:480; Blood:322] Out: [id number] Intake/Output this shift: No intake/output data recorded. Filed Weights   08/07/20 2153  Weight: 112 kg    Examination: General exam: AAOx3 , NAD, weak appearing. HEENT:Oral mucosa moist, Ear/Nose WNL grossly, dentition normal. Respiratory system: bilaterally clear,no wheezing or crackles,no use of accessory muscle. TDC+ ON CHEST Cardiovascular system: S1 & S2 +, No JVD,. Gastrointestinal system: Abdomen soft, NT,ND, BS+ Nervous System:Alert, awake, moving extremities and grossly nonfocal Extremities: No edema, distal peripheral pulses palpable.   Skin: No rashes,no icterus. MSK: Normal muscle bulk,tone, power   Data Reviewed: I have personally reviewed following labs and imaging studies CBC: Recent Labs  Lab 08/06/20 2012 08/07/20 0258 08/08/20 0534 08/08/20 2150 08/09/20 0228  WBC 5.9 6.5 5.3  --  6.8  HGB 7.0* 7.8* 6.8* 7.5* 7.3*  HCT 22.2* 25.9* 22.4* 24.1* 22.6*  MCV 83.8 86.3 85.5  --  83.4  PLT 320 336 311  --  123456   Basic Metabolic Panel: Recent Labs  Lab 08/06/20 2012 08/07/20 0258 08/07/20 1806 08/08/20 0534 08/09/20 0228  NA 138 136  --  139 139  K 5.2* 4.9  --  5.1 4.8  CL 108 107  --  109 109  CO2 18* 15*  --  18* 18*  GLUCOSE 109* 151*  --  106* 114*  BUN 89* 91*  --  91* 89*  CREATININE 9.90* 9.77*  --  10.32* 10.18*  CALCIUM 7.4* 7.6*  --  7.3* 7.1*  MG  --  1.8  --   --   --   PHOS  --  6.5* 7.0*  --   --    GFR: Estimated Creatinine Clearance: 13.1 mL/min (A) (by C-G formula based on SCr of 10.18 mg/dL (H)). Liver Function Tests: Recent Labs  Lab 08/06/20 2012  AST 20  ALT 39  ALKPHOS 113  BILITOT 0.5  PROT 6.0*  ALBUMIN 2.3*   Recent Labs  Lab 08/06/20 2012  LIPASE 23   No results for input(s): AMMONIA in the last 168 hours. Coagulation Profile: No results for input(s): INR, PROTIME in the last 168 hours. Cardiac Enzymes: No results for input(s): CKTOTAL, CKMB, CKMBINDEX, TROPONINI in the last 168 hours. BNP (last 3 results) No results for input(s): PROBNP in the last 8760 hours. HbA1C: Recent Labs    08/07/20 0258  HGBA1C 6.0*   CBG: Recent Labs  Lab 08/08/20 1220 08/08/20 1630 08/08/20 2045 08/08/20 2330 08/09/20 0402  GLUCAP 122* 141* 94 108* 111*   Lipid Profile: No results for input(s): CHOL, HDL, LDLCALC, TRIG, CHOLHDL, LDLDIRECT in the last 72 hours. Thyroid Function Tests: No results for input(s): TSH, T4TOTAL, FREET4, T3FREE, THYROIDAB in the last 72 hours. Anemia Panel: Recent Labs    08/08/20 0534  FERRITIN 328  TIBC 231*  IRON 42*    Sepsis Labs: No results for input(s): PROCALCITON, LATICACIDVEN in the last 168 hours.  Recent Results (from the past 240 hour(s))  SARS Coronavirus 2 by RT PCR (hospital order, performed in [facility] hospital lab) Nasopharyngeal Nasopharyngeal Swab     Status: None   Collection Time: 08/06/20  8:08 PM   Specimen: Nasopharyngeal Swab  Result Value Ref Range Status   SARS Coronavirus 2 NEGATIVE NEGATIVE Final    Comment: (NOTE) SARS-CoV-2 target nucleic acids are NOT DETECTED.  The SARS-CoV-2 RNA is generally detectable in upper and lower respiratory specimens during the acute phase of infection. The lowest concentration of SARS-CoV-2 viral copies this assay can detect is 250 copies / mL. A negative result does not preclude SARS-CoV-2 infection and should not be used as the sole basis for treatment or other patient management decisions.  A negative result may occur with improper specimen collection / handling, submission of specimen other than nasopharyngeal swab, presence of viral mutation(s) within the areas targeted by this assay, and inadequate number of viral copies (<250 copies / mL). A negative result must be combined with clinical observations, patient history, and epidemiological information.  Fact Sheet for Patients:   StrictlyIdeas.no  Fact Sheet for Healthcare Providers: BankingDealers.co.za  This test is not yet approved or  cleared by the [place] FDA and has been authorized for detection and/or diagnosis of SARS-CoV-2 by FDA under an Emergency Use Authorization (EUA).  This EUA will remain in effect (meaning this test can be used) for the duration of the COVID-19 declaration under Section 564(b)(1) of the Act, 21 U.S.C. section 360bbb-3(b)(1), unless the authorization is terminated or revoked sooner.  Performed at [facility], [place] [address], [place], [place] [address]      Radiology Studies: IR  US Guide Vasc Access Right  Result Date: 08/07/2020 CLINICAL DATA:  Renal failure and need for tunneled hemodialysis catheter to begin hemodialysis treatment. EXAM: TUNNELED CENTRAL VENOUS  HEMODIALYSIS CATHETER PLACEMENT WITH ULTRASOUND AND FLUOROSCOPIC GUIDANCE ANESTHESIA/SEDATION: 2.0 mg IV Versed; 50 mcg IV Fentanyl. Total Moderate Sedation Time:   28 minutes. The patient's level of consciousness and physiologic status were continuously monitored during the procedure by Radiology nursing. MEDICATIONS: 2 g IV Ancef. FLUOROSCOPY TIME:  36 seconds. 6.1 mGy. PROCEDURE: The procedure, risks, benefits, and alternatives were explained to the patient. Questions regarding the procedure were encouraged and answered. The patient understands and consents to the procedure. A timeout was performed prior to initiating the procedure. Ultrasound was utilized to confirm patency of the right internal jugular vein. The right neck and chest were prepped with chlorhexidine in a sterile fashion, and a sterile drape was applied covering the operative field. Maximum barrier sterile technique with sterile gowns and gloves were used for the procedure. Local anesthesia was provided with 1% lidocaine. After creating a small venotomy incision, a 21 gauge needle was advanced into the right internal jugular vein under direct, real-time ultrasound guidance. Ultrasound image documentation was performed. After securing guidewire access, an 8 Fr dilator was placed. A J-wire was kinked to measure appropriate catheter length. A Palindrome tunneled hemodialysis catheter measuring 23 cm from tip to cuff was chosen for placement. This was tunneled in a retrograde fashion from the chest wall to the venotomy incision. At the venotomy, serial dilatation was performed and a 15 Fr peel-away sheath was placed over a guidewire. The catheter was then placed through the sheath and the sheath removed. Final catheter positioning was confirmed and documented with  a fluoroscopic spot image. The catheter was aspirated, flushed with saline, and injected with appropriate volume heparin dwells. The venotomy incision was closed with subcuticular 4-0 Vicryl. Dermabond was applied to the incision. The catheter exit site was secured with 0-Prolene retention sutures. COMPLICATIONS: None.  No pneumothorax. FINDINGS: After catheter placement, the tip lies in the right atrium. The catheter aspirates normally and is ready for immediate use. IMPRESSION: Placement of tunneled hemodialysis catheter via the right internal jugular vein. The catheter tip lies in the right atrium. The catheter is ready for immediate use. Electronically Signed   By: Aletta Edouard M.D.   On: 08/07/2020 17:01   VAS [place] UPPER EXT VEIN MAPPING (PRE-OP AVF)  Result Date: 08/08/2020 UPPER EXTREMITY VEIN MAPPING  Indications: Pre-access. Comparison Study: No previous Performing Technologist: Vonzell Schlatter AVF  Examination Guidelines: A complete evaluation includes B-mode imaging, spectral Doppler, color Doppler, and power Doppler as needed of all accessible portions of each vessel. Bilateral testing is considered an integral part of a complete examination. Limited examinations for reoccurring indications may be performed as noted. +-----------------+-------------+----------+---------+ Right Cephalic   Diameter (cm)Depth (cm)Findings  +-----------------+-------------+----------+---------+ Shoulder             0.43        0.44   branching +-----------------+-------------+----------+---------+ Prox upper arm       0.35        0.38             +-----------------+-------------+----------+---------+ Mid upper arm        0.37        0.28             +-----------------+-------------+----------+---------+ Dist upper arm       0.37        0.23   branching +-----------------+-------------+----------+---------+ Antecubital fossa    0.70        0.23              +-----------------+-------------+----------+---------+ Prox  forearm         0.41        0.27             +-----------------+-------------+----------+---------+ Mid forearm          0.32        0.23             +-----------------+-------------+----------+---------+ Dist forearm         0.33        0.26   branching +-----------------+-------------+----------+---------+ Wrist                0.33        0.21   branching +-----------------+-------------+----------+---------+ +-----------------+-------------+----------+---------+ Left Cephalic    Diameter (cm)Depth (cm)Findings  +-----------------+-------------+----------+---------+ Shoulder             0.40        0.57             +-----------------+-------------+----------+---------+ Prox upper arm       0.32        0.27             +-----------------+-------------+----------+---------+ Mid upper arm        0.26        0.29   branching +-----------------+-------------+----------+---------+ Dist upper arm       0.27        0.24             +-----------------+-------------+----------+---------+ Antecubital fossa    0.30        0.18             +-----------------+-------------+----------+---------+ Prox forearm         0.30        0.18             +-----------------+-------------+----------+---------+ Mid forearm          0.33        0.22             +-----------------+-------------+----------+---------+ Dist forearm         0.23        0.23   branching +-----------------+-------------+----------+---------+ Wrist                0.33        0.30             +-----------------+-------------+----------+---------+ *See table(s) above for measurements and observations.  Diagnosing physician: Jamelle Haring Electronically signed by Jamelle Haring on 08/08/2020 at 5:15:08 PM.    Final    IR TUNNELED CENTRAL VENOUS CATHETER PLACEMENT  Result Date: 08/07/2020 CLINICAL DATA:  Renal failure and need for tunneled  hemodialysis catheter to begin hemodialysis treatment. EXAM: TUNNELED CENTRAL VENOUS HEMODIALYSIS CATHETER PLACEMENT WITH ULTRASOUND AND FLUOROSCOPIC GUIDANCE ANESTHESIA/SEDATION: 2.0 mg IV Versed; 50 mcg IV Fentanyl. Total Moderate Sedation Time:   28 minutes. The patient's level of consciousness and physiologic status were continuously monitored during the procedure by Radiology nursing. MEDICATIONS: 2 g IV Ancef. FLUOROSCOPY TIME:  36 seconds. 6.1 mGy. PROCEDURE: The procedure, risks, benefits, and alternatives were explained to the patient. Questions regarding the procedure were encouraged and answered. The patient understands and consents to the procedure. A timeout was performed prior to initiating the procedure. Ultrasound was utilized to confirm patency of the right internal jugular vein. The right neck and chest were prepped with chlorhexidine in a sterile fashion, and a sterile drape was applied covering the operative field. Maximum barrier sterile technique with sterile gowns and gloves were used for the procedure. Local  anesthesia was provided with 1% lidocaine. After creating a small venotomy incision, a 21 gauge needle was advanced into the right internal jugular vein under direct, real-time ultrasound guidance. Ultrasound image documentation was performed. After securing guidewire access, an 8 Fr dilator was placed. A J-wire was kinked to measure appropriate catheter length. A Palindrome tunneled hemodialysis catheter measuring 23 cm from tip to cuff was chosen for placement. This was tunneled in a retrograde fashion from the chest wall to the venotomy incision. At the venotomy, serial dilatation was performed and a 15 Fr peel-away sheath was placed over a guidewire. The catheter was then placed through the sheath and the sheath removed. Final catheter positioning was confirmed and documented with a fluoroscopic spot image. The catheter was aspirated, flushed with saline, and injected with appropriate  volume heparin dwells. The venotomy incision was closed with subcuticular 4-0 Vicryl. Dermabond was applied to the incision. The catheter exit site was secured with 0-Prolene retention sutures. COMPLICATIONS: None.  No pneumothorax. FINDINGS: After catheter placement, the tip lies in the right atrium. The catheter aspirates normally and is ready for immediate use. IMPRESSION: Placement of tunneled hemodialysis catheter via the right internal jugular vein. The catheter tip lies in the right atrium. The catheter is ready for immediate use. Electronically Signed   By: Aletta Edouard M.D.   On: 08/07/2020 17:01     LOS: 2 days   Antonieta Pert, MD [facility]  08/09/2020, 8:15 AM

## 2020-08-09 NOTE — Progress Notes (Addendum)
1/29 2025 Pt complains of left shoulder pain, chest pain.Bp [id number]), P/81, RO:[id number]. Blount,NP notified. RN awaiting orders. RN will continue to monitor pt.  1/29 2038 Orders received.  1/29 Pt given two doses of nitroglycerin. Pt is observed to be comfortably sleeping. RN will continue to monitor pt.

## 2020-08-10 LAB — CBC
HCT: 22.5 % — ABNORMAL LOW (ref 39.0–52.0)
Hemoglobin: 7.3 g/dL — ABNORMAL LOW (ref 13.0–17.0)
MCH: 26.8 pg (ref 26.0–34.0)
MCHC: 32.4 g/dL (ref 30.0–36.0)
MCV: 82.7 fL (ref 80.0–100.0)
Platelets: 296 10*3/uL (ref 150–400)
RBC: 2.72 MIL/uL — ABNORMAL LOW (ref 4.22–5.81)
RDW: 16.2 % — ABNORMAL HIGH (ref 11.5–15.5)
WBC: 6.2 10*3/uL (ref 4.0–10.5)
nRBC: 0 % (ref 0.0–0.2)

## 2020-08-10 LAB — BASIC METABOLIC PANEL
Anion gap: 11 (ref 5–15)
BUN: 69 mg/dL — ABNORMAL HIGH (ref 6–20)
CO2: 22 mmol/L (ref 22–32)
Calcium: 6.9 mg/dL — ABNORMAL LOW (ref 8.9–10.3)
Chloride: 106 mmol/L (ref 98–111)
Creatinine, Ser: 8.43 mg/dL — ABNORMAL HIGH (ref 0.61–1.24)
GFR, Estimated: 8 mL/min — ABNORMAL LOW (ref >60)
Glucose, Bld: 120 mg/dL — ABNORMAL HIGH (ref 70–99)
Potassium: 4.5 mmol/L (ref 3.5–5.1)
Sodium: 139 mmol/L (ref 135–145)

## 2020-08-10 LAB — GLUCOSE, CAPILLARY
Glucose-Capillary: 108 mg/dL — ABNORMAL HIGH (ref 70–99)
Glucose-Capillary: 111 mg/dL — ABNORMAL HIGH (ref 70–99)
Glucose-Capillary: 111 mg/dL — ABNORMAL HIGH (ref 70–99)
Glucose-Capillary: 116 mg/dL — ABNORMAL HIGH (ref 70–99)
Glucose-Capillary: 84 mg/dL (ref 70–99)
Glucose-Capillary: 86 mg/dL (ref 70–99)

## 2020-08-10 LAB — TROPONIN I (HIGH SENSITIVITY): Troponin I (High Sensitivity): 6 ng/L (ref <18)

## 2020-08-10 MED ORDER — CEFAZOLIN SODIUM-DEXTROSE 2-4 GM/100ML-% IV SOLN
2.0000 g | INTRAVENOUS | Status: DC
  Filled 2020-08-10: qty 100

## 2020-08-10 MED ORDER — TAMSULOSIN HCL 0.4 MG PO CAPS
0.4000 mg | ORAL_CAPSULE | Freq: Every day | ORAL | Status: DC
  Administered 2020-08-10 – 2020-08-12 (×3): 0.4 mg via ORAL
  Filled 2020-08-10 (×3): qty 1

## 2020-08-10 MED ORDER — CEFAZOLIN SODIUM-DEXTROSE 1-4 GM/50ML-% IV SOLN: 1.0000 g | INTRAVENOUS | Status: DC

## 2020-08-10 NOTE — Progress Notes (Signed)
[facility]  PROGRESS NOTE    [name]  [id number] DOB: [birth year] DOA: 08/06/2020 PCP: Patient, No Pcp Per   Chief Complaint  Patient presents with  . Leg Swelling  . 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  Hyperkalemia 5.2 metabolic acidosis BUN 89 creatinine 9.9, anemia COVID-19 negative BNP elevated 2067. Patient placed on IV Lasix and admitted. Nephrology was consulted Patient continued ON diuresis Patient underwent first dialysis 08/09/2020  Subjective: Seen and examined this morning.  Resting comfortably in the bedside chair.  Reports his leg swelling is much improved. Overnight blood pressure 160s to 170s  Assessment & Plan:  New ESRD: Status post HD first session 1/29 with TDC. vvs following for vein mapping aVF access Monday, nephrology following closely for ongoing dialysis needs.  Volume overload due to progressive CKD D ESRD, continue diuresis w/ iv lasix '160mg'$  bed w/Zaroxylyn and HD as epr nephro, I/o and wt improving as below Net IO Since Admission: -3,266.68 mL [08/10/20 0826]  Filed Weights   08/09/20 0815 08/09/20 1029 08/10/20 0441  Weight: 112 kg (P) 109.8 kg 107.4 kg   Hypertension urgency in the setting of fluid overload and missed meds.  Blood pressure borderline controlled continue Coreg, losartan ADDED. Monitor and adjust  Urine retention refusing in and out cath, add Flomax  Metabolic acidosis/uremic symptoms in the setting of #1 continue oral bicarb, bicarb has improved.    Anemia likely from chronic renal disease low serum iron, on ESA and IV iron as per nephrology.  H&H dropped to 6.8, s/p 1 unit PRBC  1/28 H&H.  Monitor hemoglobin and transfuse if less than 7 g.  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    Insulin-dependent diabetes mellitus hemoglobin A1c 6.11 April 2020, blood sugar stable  Morbid obesity BMI 33.  BMI improving at 32.  Advised outpatient weight loss.  Hopefully weight  will improve after addressing fluid overload.    Nutrition: Diet Order            Diet renal/carb modified with fluid restriction Diet-HS Snack? Nothing; Fluid restriction: 1200 mL Fluid; Room service appropriate? Yes; Fluid consistency: Thin  Diet effective now                 Body mass index is 32.11 kg/m.  DVT prophylaxis: heparin injection 5,000 Units Start: 08/07/20 0600 Code Status:   Code Status: Full Code  Family Communication: plan of care discussed with patient at bedside.  Status is: Inpatient Remains inpatient appropriate because:Inpatient level of care appropriate due to severity of illness and For ongoing management of fluid overload CKD, anemia  Dispo: The patient is from: Home              Anticipated d/c is to: Home              Anticipated d/c date is:2 days              Patient currently is not medically stable to d/c.   Difficult  to place patient No  Consultants:see note  Procedures:see note  Culture/Microbiology    Component Value Date/Time   SDES ABSCESS FINGER LEFT 07/16/2014 2000   SDES ABSCESS FINGER LEFT 07/16/2014 2000   SPECREQUEST NONE 07/16/2014 2000   [place] NONE 07/16/2014 2000   CULT  07/16/2014 2000    NO ANAEROBES ISOLATED Performed at [place]  07/16/2014 2000    MODERATE STREPTOCOCCUS GROUP F Performed at [place] 07/22/2014 FINAL 07/16/2014 2000    REPTSTATUS 07/19/2014 FINAL 07/16/2014 2000    Other culture-see note  Medications: Scheduled Meds: . carvedilol  6.25 mg Oral BID WC  . Chlorhexidine Gluconate Cloth  6 each Topical Q0600  . heparin  5,000 Units Subcutaneous Q8H  . insulin aspart  0-6 Units Subcutaneous Q4H  . losartan  25 mg Oral Daily  . metolazone  5 mg Oral Daily  . sevelamer carbonate  800 mg Oral TID WC  . sodium bicarbonate  650 mg Oral TID   Continuous Infusions: . ferric gluconate (FERRLECIT/NULECIT) IV    . furosemide 160 mg (08/09/20 1835)    Antimicrobials: Anti-infectives (From admission, onward)   Start     Dose/Rate Route Frequency Ordered Stop   08/07/20 1415  ceFAZolin (ANCEF) IVPB 2g/100 mL premix        2 g 200 mL/hr over 30 Minutes Intravenous To Radiology 08/07/20 1405 08/07/20 1649     Objective: Vitals: Today's Vitals   08/10/20 0418 08/10/20 0441 08/10/20 0743 08/10/20 0813  BP: (!) 167/109   (!) 167/102  Pulse: 77   75  Resp: 16   17  Temp: 98.4 F (36.9 C)   98.6 F (37 C)  TempSrc: Oral   Oral  SpO2: 97%   100%  Weight:  107.4 kg    Height:      PainSc:   0-No pain     Intake/Output Summary (Last 24 hours) at 08/10/2020 0822 Last data filed at 08/10/2020 0428 Gross per 24 hour  Intake 84.97 ml  Output 2877 ml  Net -2792.03 ml   Filed Weights   08/09/20 0815 08/09/20 1029 08/10/20 0441  Weight: 112 kg (P) 109.8 kg 107.4 kg   Weight change:   Intake/Output from previous day: 01/29 0701 - 01/30 0700 In: 85 [IV Piggyback:85] Out: 2877 [Urine:2100] Intake/Output this shift: No intake/output data recorded. Filed Weights   08/09/20 0815 08/09/20 1029 08/10/20 0441  Weight: 112 kg (P) 109.8 kg 107.4 kg    Examination: General exam: AAO X3, NAD, weak appearing. HEENT:Oral mucosa moist, Ear/Nose WNL grossly, dentition normal. Respiratory system: bilaterally clear,no wheezing or crackles,no use of accessory muscle Cardiovascular system: S1 & S2 +, No  JVD,. Gastrointestinal system: Abdomen soft, NT,ND, BS+ Nervous System:Alert, awake, moving extremities and grossly nonfocal Extremities: Mild lower leg edema present but significantly improved, distal peripheral pulses palpable.  Skin: No rashes,no icterus. MSK: Normal muscle bulk,tone, power TDC catheter present on the chest.  Data Reviewed: I have personally reviewed following labs and imaging studies CBC: Recent Labs  Lab 08/06/20 2012 08/07/20 0258 08/08/20 0534 08/08/20 2150 08/09/20 0228 08/09/20 2342  WBC 5.9 6.5 5.3  --  6.8 6.2  HGB 7.0* 7.8* 6.8* 7.5* 7.3* 7.3*  HCT 22.2* 25.9* 22.4* 24.1* 22.6* 22.5*  MCV 83.8 86.3 85.5  --  83.4 82.7  PLT 320 336 311  --  305 0000000   Basic Metabolic Panel: Recent Labs  Lab 08/07/20 0258  08/07/20 1806 08/08/20 0534 08/09/20 0228 08/09/20 2114 08/09/20 2342  NA 136  --  139 139 137 139  K 4.9  --  5.1 4.8 4.5 4.5  CL 107  --  109 109 103 106  CO2 15*  --  18* 18* 22 22  GLUCOSE 151*  --  106* 114* 121* 120*  BUN 91*  --  91* 89* 68* 69*  CREATININE 9.77*  --  10.32* 10.18* 8.53* 8.43*  CALCIUM 7.6*  --  7.3*  7.2* 7.1* 6.9* 6.9*  MG 1.8  --   --   --   --   --   PHOS 6.5* 7.0*  --   --   --   --    GFR: Estimated Creatinine Clearance: 15.5 mL/min (A) (by C-G formula based on SCr of 8.43 mg/dL (H)). Liver Function Tests: Recent Labs  Lab 08/06/20 2012 08/09/20 2114  AST 20 11*  ALT 39 12  ALKPHOS 113 99  BILITOT 0.5 0.5  PROT 6.0* 5.4*  ALBUMIN 2.3* 2.1*   Recent Labs  Lab 08/06/20 2012  LIPASE 23   No results for input(s): AMMONIA in the last 168 hours. Coagulation Profile: No results for input(s): INR, PROTIME in the last 168 hours. Cardiac Enzymes: No results for input(s): CKTOTAL, CKMB, CKMBINDEX, TROPONINI in the last 168 hours. BNP (last 3 results) No results for input(s): PROBNP in the last 8760 hours. HbA1C: No results for input(s): HGBA1C in the last 72 hours. CBG: Recent Labs  Lab  08/09/20 1610 08/09/20 2001 08/10/20 0013 08/10/20 0417 08/10/20 0815  GLUCAP 98 116* 108* 84 111*   Lipid Profile: No results for input(s): CHOL, HDL, LDLCALC, TRIG, CHOLHDL, LDLDIRECT in the last 72 hours. Thyroid Function Tests: No results for input(s): TSH, T4TOTAL, FREET4, T3FREE, THYROIDAB in the last 72 hours. Anemia Panel: Recent Labs    08/08/20 0534  FERRITIN 328  TIBC 231*  IRON 42*   Sepsis Labs: No results for input(s): PROCALCITON, LATICACIDVEN in the last 168 hours.  Recent Results (from the past 240 hour(s))  SARS Coronavirus 2 by RT PCR (hospital order, performed in [facility] hospital lab) Nasopharyngeal Nasopharyngeal Swab     Status: None   Collection Time: 08/06/20  8:08 PM   Specimen: Nasopharyngeal Swab  Result Value Ref Range Status   SARS Coronavirus 2 NEGATIVE NEGATIVE Final    Comment: (NOTE) SARS-CoV-2 target nucleic acids are NOT DETECTED.  The SARS-CoV-2 RNA is generally detectable in upper and lower respiratory specimens during the acute phase of infection. The lowest concentration of SARS-CoV-2 viral copies this assay can detect is 250 copies / mL. A negative result does not preclude SARS-CoV-2 infection and should not be used as the sole basis for treatment or other patient management decisions.  A negative result may occur with improper specimen collection / handling, submission of specimen other than nasopharyngeal swab, presence of viral mutation(s) within the areas targeted by this assay, and inadequate number of viral copies (<250 copies / mL). A negative result must be combined with clinical observations, patient history, and epidemiological information.  Fact Sheet for Patients:   StrictlyIdeas.no  Fact Sheet for Healthcare Providers: BankingDealers.co.za  This test is not yet approved or  cleared by the [place] FDA and has been authorized for detection and/or diagnosis of  SARS-CoV-2 by FDA under an Emergency Use Authorization (EUA).  This EUA will remain in effect (meaning this test can be used) for the duration of the COVID-19  declaration under Section 564(b)(1) of the Act, 21 U.S.C. section 360bbb-3(b)(1), unless the authorization is terminated or revoked sooner.  Performed at [facility], [place] [address] [place], [place] [address]      Radiology Studies: DG CHEST PORT 1 VIEW  Result Date: 08/09/2020 CLINICAL DATA:  Chest pain in. EXAM: PORTABLE CHEST 1 VIEW COMPARISON:  Radiograph 2 days ago 08/07/2020 FINDINGS: Interval placement of right-sided dialysis catheter with tip overlying the atrial caval junction. Unchanged cardiomegaly. Increase fluid in the fissures. Small pleural effusions. Mild vascular congestion. No pneumothorax or confluent airspace disease. No acute osseous abnormalities are seen. IMPRESSION: 1. Interval placement of right-sided dialysis catheter with tip overlying the atrial caval junction. 2. Unchanged cardiomegaly. Vascular congestion. Increasing fluid in the fissures. Small pleural effusions are grossly stable. Electronically Signed   By: Keith Rake M.D.   On: 08/09/2020 21:10   VAS [place] UPPER EXT VEIN MAPPING (PRE-OP AVF)  Result Date: 08/08/2020 UPPER EXTREMITY VEIN MAPPING  Indications: Pre-access. Comparison Study: No previous Performing Technologist: Vonzell Schlatter AVF  Examination Guidelines: A complete evaluation includes B-mode imaging, spectral Doppler, color Doppler, and power Doppler as needed of all accessible portions of each vessel. Bilateral testing is considered an integral part of a complete examination. Limited examinations for reoccurring indications may be performed as noted. +-----------------+-------------+----------+---------+ Right Cephalic   Diameter (cm)Depth (cm)Findings  +-----------------+-------------+----------+---------+ Shoulder             0.43        0.44   branching  +-----------------+-------------+----------+---------+ Prox upper arm       0.35        0.38             +-----------------+-------------+----------+---------+ Mid upper arm        0.37        0.28             +-----------------+-------------+----------+---------+ Dist upper arm       0.37        0.23   branching +-----------------+-------------+----------+---------+ Antecubital fossa    0.70        0.23             +-----------------+-------------+----------+---------+ Prox forearm         0.41        0.27             +-----------------+-------------+----------+---------+ Mid forearm          0.32        0.23             +-----------------+-------------+----------+---------+ Dist forearm         0.33        0.26   branching +-----------------+-------------+----------+---------+ Wrist                0.33        0.21   branching +-----------------+-------------+----------+---------+ +-----------------+-------------+----------+---------+ Left Cephalic    Diameter (cm)Depth (cm)Findings  +-----------------+-------------+----------+---------+ Shoulder             0.40        0.57             +-----------------+-------------+----------+---------+ Prox upper arm       0.32        0.27             +-----------------+-------------+----------+---------+ Mid upper arm        0.26        0.29   branching +-----------------+-------------+----------+---------+ Dist upper arm  0.27        0.24             +-----------------+-------------+----------+---------+ Antecubital fossa    0.30        0.18             +-----------------+-------------+----------+---------+ Prox forearm         0.30        0.18             +-----------------+-------------+----------+---------+ Mid forearm          0.33        0.22             +-----------------+-------------+----------+---------+ Dist forearm         0.23        0.23   branching  +-----------------+-------------+----------+---------+ Wrist                0.33        0.30             +-----------------+-------------+----------+---------+ *See table(s) above for measurements and observations.  Diagnosing physician: Jamelle Haring Electronically signed by Jamelle Haring on 08/08/2020 at 5:15:08 PM.    Final      LOS: 3 days   Antonieta Pert, MD [facility]  08/10/2020, 8:22 AM

## 2020-08-10 NOTE — Progress Notes (Signed)
[facility] Progress Note   Subjective:   1st HD yesterday.  Has had RLS symptoms lately - we discussed his low iron.  No new issues.  UOP 2174m yesterday, UF 0.858m  No new complaints.  Objective Vitals:   08/09/20 2000 08/10/20 0418 08/10/20 0441 08/10/20 0813  BP: (!) 163/97 (!) 167/109  (!) 167/102  Pulse: 81 77  75  Resp: '18 16  17  '$ Temp: 98.7 F (37.1 C) 98.4 F (36.9 C)  98.6 F (37 C)  TempSrc: Oral Oral  Oral  SpO2: 100% 97%  100%  Weight:   107.4 kg   Height:       Physical Exam General: comfortable flat in bed Heart: RRR no rub Lungs: normal WOB on RA Abdomen: soft Extremities:3+ edema to hips bit some improvement; chronic wounds on dorsum of feet, no erythema or drainage [facility]  Additional Objective Labs: Basic Metabolic Panel: Recent Labs  Lab 08/07/20 0258 08/07/20 1806 08/08/20 0534 08/09/20 0228 08/09/20 2114 08/09/20 2342  NA 136  --    < > 139 137 139  K 4.9  --    < > 4.8 4.5 4.5  CL 107  --    < > 109 103 106  CO2 15*  --    < > 18* 22 22  GLUCOSE 151*  --    < > 114* 121* 120*  BUN 91*  --    < > 89* 68* 69*  CREATININE 9.77*  --    < > 10.18* 8.53* 8.43*  CALCIUM 7.6*  --    < > 7.1* 6.9* 6.9*  PHOS 6.5* 7.0*  --   --   --   --    < > = values in this interval not displayed.   Liver Function Tests: Recent Labs  Lab 08/06/20 2012 08/09/20 2114  AST 20 11*  ALT 39 12  ALKPHOS 113 99  BILITOT 0.5 0.5  PROT 6.0* 5.4*  ALBUMIN 2.3* 2.1*   Recent Labs  Lab 08/06/20 2012  LIPASE 23   CBC: Recent Labs  Lab 08/06/20 2012 08/07/20 0258 08/08/20 0534 08/08/20 2150 08/09/20 0228 08/09/20 2342  WBC 5.9 6.5 5.3  --  6.8 6.2  HGB 7.0* 7.8* 6.8* 7.5* 7.3* 7.3*  HCT 22.2* 25.9* 22.4* 24.1* 22.6* 22.5*  MCV 83.8 86.3 85.5  --  83.4 82.7  PLT 320 336 311  --  305 296   Blood Culture    Component Value Date/Time   SDES ABSCESS FINGER LEFT 07/16/2014 2000   SDES ABSCESS FINGER LEFT  07/16/2014 2000   SPECREQUEST NONE 07/16/2014 2000   [place] 2000   CULT  07/16/2014 2000    NO ANAEROBES ISOLATED [facility]formed at [facility]2014 2000    MODERATE STREPTOCOCCUS GROUP F [place] [facility] FINAL 07/16/2014 2000   REPTSTATUS 07/19/2014 FINAL 07/16/2014 2000    Cardiac Enzymes: No results for input(s): CKTOTAL, CKMB, CKMBINDEX, TROPONINI in the last 168 hours. CBG: Recent Labs  Lab 08/09/20 1610 08/09/20 2001 08/10/20 0013 08/10/20 0417 08/10/20 0815  GLUCAP 98 116* 108* 84 111*   Iron Studies:  Recent Labs    08/08/20 0534  IRON 42*  TIBC 231*  FERRITIN 328   '@lablastinr3'$ @ Studies/Results: DG CHEST PORT 1 VIEW  Result Date: 08/09/2020 CLINICAL DATA:  Chest pain in. EXAM: PORTABLE CHEST 1 VIEW COMPARISON:  Radiograph 2 days ago 08/07/2020 FINDINGS: Interval placement of right-sided dialysis catheter with tip overlying the atrial caval junction. Unchanged cardiomegaly. Increase fluid in the fissures. Small pleural effusions. Mild vascular congestion. No pneumothorax or confluent airspace disease. No acute osseous abnormalities are seen. IMPRESSION: 1. Interval placement of right-sided dialysis catheter with tip overlying the atrial caval junction. 2. Unchanged cardiomegaly. Vascular congestion. Increasing fluid in the fissures. Small pleural effusions are grossly stable. Electronically Signed   By: Keith Rake M.D.   On: 08/09/2020 21:10   Medications: . [START ON 08/11/2020]  ceFAZolin (ANCEF) IV    . ferric gluconate (FERRLECIT/NULECIT) IV    . furosemide 160 mg (08/10/20 1020)   . carvedilol  6.25 mg Oral BID WC  . Chlorhexidine Gluconate Cloth  6 each Topical Q0600  . heparin  5,000 Units Subcutaneous Q8H  . insulin aspart  0-6 Units Subcutaneous Q4H  . losartan  25 mg Oral Daily  . metolazone  5 mg Oral Daily  . sevelamer carbonate  800 mg Oral TID WC  . sodium  bicarbonate  650 mg Oral TID  . tamsulosin  0.4 mg Oral QPC supper    Assessment/Plan **new ESRD:  Follows with Dr. Daiva Huge but no appt since 02/2020 due to missed appts.  This appears to be progressive renal decline to point of needing to initiate dialysis > ESRD.  S/p [facility] placement 08/08/20.  HD #1 1/29. VVS for perm access - saw and plan AVF 08/11/20, appreciate. Plan HD #2 2/1, ordered. CLIP underway.  **volume overload: cont torsemide 100 BID and metolazone 5 daily for diuresis in between HD treatments given marked overload; UF with HD;  Strict I/Os. Restrict sodium, fluid.   **HTN urgency:  Remains high.  Should improve with UF but in the meantime added losartan 25 daily - K has been ok and he has intolerance to hydralazine and amlodipine; cont coreg.   **Metabolic acidosis: resolved, d/c po bicarb  **Anemia:  Hb 7.3 which is recent Baseline.  Iron deficient > giving course of IV iron.  Hemoccult ordered.  Start ESA in a few days after iron on board.   **BMM: phos 7.0, PTH pending.  Renal diet and sevelamer 1 TIDAC.   **DM: per primary.  **RLS: replete iron  Jannifer Hick MD 08/10/2020, 11:40 AM  [facility] Pager: [phone]

## 2020-08-10 NOTE — Plan of Care (Signed)

## 2020-08-10 NOTE — Plan of Care (Signed)
  Problem: Health Behavior/Discharge Planning: Goal: Ability to manage health-related needs will improve Outcome: Progressing   Problem: Clinical Measurements: Goal: Ability to maintain clinical measurements within normal limits will improve Outcome: Progressing Goal: Diagnostic test results will improve Outcome: Progressing   

## 2020-08-10 NOTE — Anesthesia Preprocedure Evaluation (Signed)
Anesthesia Evaluation  Patient identified by MRN, date of birth, ID band Patient awake    Reviewed: NPO status , Patient's Chart, lab work & pertinent test results  Airway Mallampati: II  TM Distance: >3 FB Neck ROM: Full    Dental  (+) Missing   Pulmonary neg pulmonary ROS,    Pulmonary exam normal        Cardiovascular hypertension, Pt. on medications  Rhythm:Regular Rate:Normal     Neuro/Psych negative neurological ROS  negative psych ROS   GI/Hepatic negative GI ROS, Neg liver ROS,   Endo/Other  diabetes, Poorly Controlled, Insulin Dependent  Renal/GU ESRFRenal disease  negative genitourinary   Musculoskeletal negative musculoskeletal ROS (+)   Abdominal (+)  Abdomen: soft. Bowel sounds: normal.  Peds  Hematology  (+) anemia ,   Anesthesia Other Findings   Reproductive/Obstetrics                            Anesthesia Physical Anesthesia Plan  ASA: III  Anesthesia Plan: [facility] and [facility]   Post-op Pain Management:    Induction: Intravenous  PONV Risk Score and Plan: 1 and Ondansetron and Propofol infusion  Airway Management Planned: Simple Face Mask, Natural Airway and Nasal Cannula  Additional Equipment: None  Intra-op Plan:   Post-operative Plan:   Informed Consent: I have reviewed the patients History and Physical, chart, labs and discussed the procedure including the risks, benefits and alternatives for the proposed anesthesia with the patient or authorized representative who has indicated his/her understanding and acceptance.     Dental advisory given  Plan Discussed with: CRNA  Anesthesia Plan Comments: (Lab Results      Component                Value               Date                      WBC                      6.2                 08/09/2020                HGB                      7.3 (L)             08/09/2020                HCT                       22.5 (L)            08/09/2020                MCV                      82.7                08/09/2020                PLT                      296                 08/09/2020  Lab Results      Component                Value               Date                      NA                       139                 08/09/2020                K                        4.5                 08/09/2020                CO2                      22                  08/09/2020                GLUCOSE                  120 (H)             08/09/2020                BUN                      69 (H)              08/09/2020                CREATININE               8.43 (H)            08/09/2020                CALCIUM                  6.9 (L)             08/09/2020                GFRNONAA                 8 (L)               08/09/2020                GFRAA                    19 (L)              03/10/2020          )       Anesthesia Quick Evaluation

## 2020-08-10 NOTE — Progress Notes (Signed)
   ASSESSMENT & PLAN:  [name] is a 35 y.o. male nearing ESRD. In need of permanent HD access. [place] already in place. Left wrist cephalic vein appears appropriate for AVF. Will plan on left radiocephalic AVF tomorrow in OR. NPO after midnight. Update COVID screen.  SUBJECTIVE:  No complaints. Ready for OR.  OBJECTIVE:  BP (!) 167/102   Pulse 75   Temp 98.6 F (37 C) (Oral)   Resp 17   Ht 6' (1.829 m)   Wt 107.4 kg   SpO2 100%   BMI 32.11 kg/m   Intake/Output Summary (Last 24 hours) at 08/10/2020 1215 Last data filed at 08/10/2020 0428 Gross per 24 hour  Intake 84.97 ml  Output 2100 ml  Net -2015.03 ml    2+ radial pulses Cephalic vein appears healthy in L arm  CBC Latest Ref Rng & Units 08/09/2020 08/09/2020 08/08/2020  WBC 4.0 - 10.5 K/uL 6.2 6.8 -  Hemoglobin 13.0 - 17.0 g/dL 7.3(L) 7.3(L) 7.5(L)  Hematocrit 39.0 - 52.0 % 22.5(L) 22.6(L) 24.1(L)  Platelets 150 - 400 K/uL 296 305 -     CMP Latest Ref Rng & Units 08/09/2020 08/09/2020 08/09/2020  Glucose 70 - 99 mg/dL 120(H) 121(H) 114(H)  BUN 6 - 20 mg/dL 69(H) 68(H) 89(H)  Creatinine 0.61 - 1.24 mg/dL 8.43(H) 8.53(H) 10.18(H)  Sodium 135 - 145 mmol/L 139 137 139  Potassium 3.5 - 5.1 mmol/L 4.5 4.5 4.8  Chloride 98 - 111 mmol/L 106 103 109  CO2 22 - 32 mmol/L 22 22 18(L)  Calcium 8.9 - 10.3 mg/dL 6.9(L) 6.9(L) 7.1(L)  Total Protein 6.5 - 8.1 g/dL - 5.4(L) -  Total Bilirubin 0.3 - 1.2 mg/dL - 0.5 -  Alkaline Phos 38 - 126 U/L - 99 -  AST 15 - 41 U/L - 11(L) -  ALT 0 - 44 U/L - 12 -    Estimated Creatinine Clearance: 15.5 mL/min (A) (by C-G formula based on SCr of 8.43 mg/dL (H)).  [name]. James Breed, MD [facility] Phone Number: [phone] 08/10/2020 12:15 PM

## 2020-08-11 ENCOUNTER — Encounter (HOSPITAL_COMMUNITY): Payer: Self-pay | Admitting: Family Medicine

## 2020-08-11 ENCOUNTER — Inpatient Hospital Stay (HOSPITAL_COMMUNITY): Payer: Medicaid Other | Admitting: Anesthesiology

## 2020-08-11 ENCOUNTER — Encounter (HOSPITAL_COMMUNITY)
Admission: EM | Disposition: A | Discharge status: Home / Self Care | Payer: Self-pay | Source: Home / Self Care | Attending: Internal Medicine

## 2020-08-11 DIAGNOSIS — N185 Chronic kidney disease, stage 5: Secondary | ICD-10-CM

## 2020-08-11 HISTORY — PX: AV FISTULA PLACEMENT: SHX1204

## 2020-08-11 LAB — BASIC METABOLIC PANEL
Anion gap: 11 (ref 5–15)
BUN: 74 mg/dL — ABNORMAL HIGH (ref 6–20)
CO2: 23 mmol/L (ref 22–32)
Calcium: 7.1 mg/dL — ABNORMAL LOW (ref 8.9–10.3)
Chloride: 102 mmol/L (ref 98–111)
Creatinine, Ser: 8.89 mg/dL — ABNORMAL HIGH (ref 0.61–1.24)
GFR, Estimated: 7 mL/min — ABNORMAL LOW (ref >60)
Glucose, Bld: 113 mg/dL — ABNORMAL HIGH (ref 70–99)
Potassium: 4.2 mmol/L (ref 3.5–5.1)
Sodium: 136 mmol/L (ref 135–145)

## 2020-08-11 LAB — CBC
HCT: 24.4 % — ABNORMAL LOW (ref 39.0–52.0)
Hemoglobin: 7.8 g/dL — ABNORMAL LOW (ref 13.0–17.0)
MCH: 26.6 pg (ref 26.0–34.0)
MCHC: 32 g/dL (ref 30.0–36.0)
MCV: 83.3 fL (ref 80.0–100.0)
Platelets: 306 10*3/uL (ref 150–400)
RBC: 2.93 MIL/uL — ABNORMAL LOW (ref 4.22–5.81)
RDW: 16.2 % — ABNORMAL HIGH (ref 11.5–15.5)
WBC: 6.5 10*3/uL (ref 4.0–10.5)
nRBC: 0 % (ref 0.0–0.2)

## 2020-08-11 LAB — GLUCOSE, CAPILLARY
Glucose-Capillary: 102 mg/dL — ABNORMAL HIGH (ref 70–99)
Glucose-Capillary: 107 mg/dL — ABNORMAL HIGH (ref 70–99)
Glucose-Capillary: 108 mg/dL — ABNORMAL HIGH (ref 70–99)
Glucose-Capillary: 113 mg/dL — ABNORMAL HIGH (ref 70–99)
Glucose-Capillary: 121 mg/dL — ABNORMAL HIGH (ref 70–99)
Glucose-Capillary: 132 mg/dL — ABNORMAL HIGH (ref 70–99)
Glucose-Capillary: 92 mg/dL (ref 70–99)

## 2020-08-11 LAB — PROTIME-INR
INR: 1.1 (ref 0.8–1.2)
Prothrombin Time: 13.8 seconds (ref 11.4–15.2)

## 2020-08-11 LAB — HEPATITIS B CORE ANTIBODY, IGM: Hep B C IgM: NONREACTIVE

## 2020-08-11 LAB — HEPATITIS B SURFACE ANTIGEN: Hepatitis B Surface Ag: NONREACTIVE

## 2020-08-11 LAB — MRSA PCR SCREENING: MRSA by PCR: NEGATIVE

## 2020-08-11 SURGERY — ARTERIOVENOUS (AV) FISTULA CREATION
Anesthesia: Monitor Anesthesia Care | Site: Arm Upper | Laterality: Left

## 2020-08-11 MED ORDER — ONDANSETRON HCL 4 MG/2ML IJ SOLN: 4.0000 mg | Freq: Once | INTRAMUSCULAR | Status: DC | PRN

## 2020-08-11 MED ORDER — ONDANSETRON HCL 4 MG/2ML IJ SOLN
INTRAMUSCULAR | Status: DC | PRN
  Administered 2020-08-11: 4 mg via INTRAVENOUS

## 2020-08-11 MED ORDER — FENTANYL CITRATE (PF) 250 MCG/5ML IJ SOLN
INTRAMUSCULAR | Status: AC
  Filled 2020-08-11: qty 5

## 2020-08-11 MED ORDER — 0.9 % SODIUM CHLORIDE (POUR BTL) OPTIME
TOPICAL | Status: DC | PRN
  Administered 2020-08-11: 1000 mL

## 2020-08-11 MED ORDER — HEPARIN SODIUM (PORCINE) 5000 UNIT/ML IJ SOLN
5000.0000 [IU] | Freq: Three times a day (TID) | INTRAMUSCULAR | Status: DC
  Administered 2020-08-11 – 2020-08-13 (×5): 5000 [IU] via SUBCUTANEOUS
  Filled 2020-08-11 (×5): qty 1

## 2020-08-11 MED ORDER — PROPOFOL 1000 MG/100ML IV EMUL
INTRAVENOUS | Status: AC
  Filled 2020-08-11: qty 100

## 2020-08-11 MED ORDER — FENTANYL CITRATE (PF) 100 MCG/2ML IJ SOLN: 25.0000 ug | INTRAMUSCULAR | Status: DC | PRN

## 2020-08-11 MED ORDER — HEMOSTATIC AGENTS (NO CHARGE) OPTIME
TOPICAL | Status: DC | PRN
  Administered 2020-08-11: 1 via TOPICAL

## 2020-08-11 MED ORDER — DARBEPOETIN ALFA 150 MCG/0.3ML IJ SOSY: 150.0000 ug | PREFILLED_SYRINGE | INTRAMUSCULAR | Status: DC

## 2020-08-11 MED ORDER — ACETAMINOPHEN 10 MG/ML IV SOLN: 1000.0000 mg | Freq: Once | INTRAVENOUS | Status: DC | PRN

## 2020-08-11 MED ORDER — BUPIVACAINE HCL (PF) 0.5 % IJ SOLN
INTRAMUSCULAR | Status: DC | PRN
  Administered 2020-08-11: 10 mL via PERINEURAL

## 2020-08-11 MED ORDER — LIDOCAINE HCL (PF) 2 % IJ SOLN
INTRAMUSCULAR | Status: DC | PRN
  Administered 2020-08-11: 400 mg via PERINEURAL

## 2020-08-11 MED ORDER — HEPARIN SODIUM (PORCINE) 1000 UNIT/ML IJ SOLN
INTRAMUSCULAR | Status: AC
  Filled 2020-08-11: qty 1

## 2020-08-11 MED ORDER — SODIUM CHLORIDE 0.9 % IV SOLN: INTRAVENOUS | Status: DC | PRN

## 2020-08-11 MED ORDER — OXYCODONE-ACETAMINOPHEN 5-325 MG PO TABS: 1.0000 | ORAL_TABLET | Freq: Four times a day (QID) | ORAL | Status: AC | PRN

## 2020-08-11 MED ORDER — HEPARIN SODIUM (PORCINE) 1000 UNIT/ML IJ SOLN
INTRAMUSCULAR | Status: DC | PRN
  Administered 2020-08-11: 5000 [IU] via INTRAVENOUS

## 2020-08-11 MED ORDER — ONDANSETRON HCL 4 MG/2ML IJ SOLN
INTRAMUSCULAR | Status: AC
  Filled 2020-08-11: qty 2

## 2020-08-11 MED ORDER — FENTANYL CITRATE (PF) 250 MCG/5ML IJ SOLN
INTRAMUSCULAR | Status: DC | PRN
  Administered 2020-08-11 (×2): 50 ug via INTRAVENOUS

## 2020-08-11 MED ORDER — PROPOFOL 500 MG/50ML IV EMUL
INTRAVENOUS | Status: DC | PRN
  Administered 2020-08-11: 75 ug/kg/min via INTRAVENOUS

## 2020-08-11 MED ORDER — MIDAZOLAM HCL 2 MG/2ML IJ SOLN
INTRAMUSCULAR | Status: AC
  Filled 2020-08-11: qty 2

## 2020-08-11 MED ORDER — CEFAZOLIN SODIUM-DEXTROSE 2-4 GM/100ML-% IV SOLN
2.0000 g | INTRAVENOUS | Status: AC
  Administered 2020-08-11: 2 g via INTRAVENOUS
  Filled 2020-08-11: qty 100

## 2020-08-11 MED ORDER — MIDAZOLAM HCL 2 MG/2ML IJ SOLN
INTRAMUSCULAR | Status: DC | PRN
  Administered 2020-08-11: 2 mg via INTRAVENOUS

## 2020-08-11 MED ORDER — SODIUM CHLORIDE 0.9 % IV SOLN
INTRAVENOUS | Status: DC | PRN
  Administered 2020-08-11: 500 mL

## 2020-08-11 SURGICAL SUPPLY — 37 items
APL PRP STRL LF DISP 70% ISPRP (MISCELLANEOUS) ×1
APL SKNCLS STERI-STRIP NONHPOA (GAUZE/BANDAGES/DRESSINGS) ×1
ARMBAND PINK RESTRICT EXTREMIT (MISCELLANEOUS) ×2 IMPLANT
BENZOIN TINCTURE PRP APPL 2/3 (GAUZE/BANDAGES/DRESSINGS) ×2 IMPLANT
CANISTER SUCT 3000ML PPV (MISCELLANEOUS) ×2 IMPLANT
CANNULA VESSEL 3MM 2 BLNT TIP (CANNULA) ×2 IMPLANT
CHLORAPREP W/TINT 26 (MISCELLANEOUS) ×2 IMPLANT
CLIP VESOCCLUDE MED 6/CT (CLIP) ×2 IMPLANT
CLIP VESOCCLUDE SM WIDE 6/CT (CLIP) ×2 IMPLANT
COVER PROBE W GEL 5X96 (DRAPES) IMPLANT
COVER WAND RF STERILE (DRAPES) ×2 IMPLANT
DRAPE EXTREMITY T 121X128X90 (DISPOSABLE) ×2 IMPLANT
DRSG TEGADERM 4X4.5 CHG (GAUZE/BANDAGES/DRESSINGS) ×1 IMPLANT
ELECT REM PT RETURN 9FT ADLT (ELECTROSURGICAL) ×2
ELECTRODE REM PT RTRN 9FT ADLT (ELECTROSURGICAL) ×1 IMPLANT
GAUZE SPONGE 4X4 12PLY STRL (GAUZE/BANDAGES/DRESSINGS) ×1 IMPLANT
GLOVE SURG SS PI 8.0 STRL IVOR (GLOVE) ×2 IMPLANT
GOWN STRL REUS W/ TWL LRG LVL3 (GOWN DISPOSABLE) ×2 IMPLANT
GOWN STRL REUS W/ TWL XL LVL3 (GOWN DISPOSABLE) ×1 IMPLANT
GOWN STRL REUS W/TWL LRG LVL3 (GOWN DISPOSABLE) ×4
GOWN STRL REUS W/TWL XL LVL3 (GOWN DISPOSABLE) ×2
HEMOSTAT SNOW SURGICEL 2X4 (HEMOSTASIS) ×1 IMPLANT
INSERT FOGARTY SM (MISCELLANEOUS) IMPLANT
KIT BASIN OR (CUSTOM PROCEDURE TRAY) ×2 IMPLANT
KIT TURNOVER KIT B (KITS) ×2 IMPLANT
NS IRRIG 1000ML POUR BTL (IV SOLUTION) ×2 IMPLANT
PACK CV ACCESS (CUSTOM PROCEDURE TRAY) ×2 IMPLANT
PAD ARMBOARD 7.5X6 YLW CONV (MISCELLANEOUS) ×4 IMPLANT
PENCIL SMOKE EVACUATOR (MISCELLANEOUS) ×2 IMPLANT
STRIP CLOSURE SKIN 1/2X4 (GAUZE/BANDAGES/DRESSINGS) ×2 IMPLANT
SUT MNCRL AB 4-0 PS2 18 (SUTURE) ×2 IMPLANT
SUT PROLENE 6 0 BV (SUTURE) ×3 IMPLANT
SUT VIC AB 3-0 SH 27 (SUTURE) ×2
SUT VIC AB 3-0 SH 27X BRD (SUTURE) ×1 IMPLANT
TOWEL GREEN STERILE (TOWEL DISPOSABLE) ×2 IMPLANT
UNDERPAD 30X36 HEAVY ABSORB (UNDERPADS AND DIAPERS) ×2 IMPLANT
WATER STERILE IRR 1000ML POUR (IV SOLUTION) ×2 IMPLANT

## 2020-08-11 NOTE — Progress Notes (Signed)
Marland Kitchen  PROGRESS NOTE    [name]  [id number] DOB: [birth year] DOA: 08/06/2020 PCP: Patient, No Pcp Per   Chief Complaint  Patient presents with  . Leg Swelling  . 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  Hyperkalemia 5.2 metabolic acidosis BUN 89 creatinine 9.9, anemia COVID-19 negative BNP elevated 2067. Patient placed on IV Lasix and admitted. Nephrology was consulted Patient continued ON diuresis Patient underwent first dialysis 08/09/2020 1/31: s/p AVF  Subjective: Patient seen this afternoon was feeling tired after work this morning, had his meal and was sleeping. Patient is s/p  AV fistula in OR this am No new complaints.  Assessment & Plan:  New ESRD: Status post HD first session 1/29 with [facility], status post aVF 1/31st, for HD #2 2/1,  [employer] Education officer, museum following.  Nephrology following closely.    Volume overload due to progressive CKD/ESRD, with improving nicely, continue IV iv lasix high dose along with /Zaroxylyn and HD as per nephrology team.  Monitor intake output and daily weight as below.   Net IO Since Admission: -3,822.4 mL [08/11/20 1146]  Filed Weights   08/09/20 1029 08/10/20 0441 08/11/20 0500  Weight: (P) 109.8 kg 107.4 kg 105.9 kg   Hypertension urgency in the setting of fluid overload and missed meds.  Blood pressure is relatively stable. Continue Coreg, continue losartan (new), unable to tolerate amlodipine and hydralazine.   Urine retention continue Flomax  Metabolic acidosis/uremic symptoms in the setting of #1 resolved, stopping oral bicarb.    Anemia likely from chronic renal disease low  serum iron, on ESA and IV iron as per nephrology.  H&H dropped to 6.8, s/p 1 unit PRBC 1/28 H&H overall stable.  Continue to monitor and transfuse if less than 7 g.  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  Will benefit with outpatient weight loss strategy.    Nutrition: Diet Order            Diet renal with fluid restriction Fluid restriction: 1200 mL Fluid; Room service appropriate? Yes; Fluid consistency: Thin  Diet effective now                 Body mass index is 31.66 kg/m.  DVT prophylaxis: heparin injection 5,000 Units Start: 08/11/20 2200 Code Status:   Code Status: Full Code  Family Communication: plan of care discussed with patient at bedside.  Status is: Inpatient Remains inpatient appropriate because:Inpatient level of care appropriate due to severity of illness and For ongoing management of fluid overload CKD, anemia  Dispo: The patient is from: Home              Anticipated d/c is to: Home  Anticipated d/c date is: once outpatient dialysis set up with negative              Patient currently is not medically stable to d/c.   Difficult to place patient No  Consultants:see note  Procedures:see note  Culture/Microbiology    Component Value Date/Time   SDES ABSCESS FINGER LEFT 07/16/2014 2000   SDES ABSCESS FINGER LEFT 07/16/2014 2000   SPECREQUEST NONE 07/16/2014 2000   [place] NONE 07/16/2014 2000   CULT  07/16/2014 2000    NO ANAEROBES  ISOLATED Performed at [place]  07/16/2014 2000    MODERATE STREPTOCOCCUS GROUP F Performed at [place] 07/22/2014 FINAL 07/16/2014 2000   REPTSTATUS 07/19/2014 FINAL 07/16/2014 2000    Other culture-see note  Medications: Scheduled Meds: . carvedilol  6.25 mg Oral BID WC  . Chlorhexidine Gluconate Cloth  6 each Topical Q0600  . [START ON 08/12/2020] darbepoetin (ARANESP) injection - DIALYSIS  150 mcg Intravenous Q Tue-HD  . heparin  5,000 Units Subcutaneous Q8H  . insulin aspart  0-6 Units Subcutaneous Q4H  . losartan  25 mg Oral Daily  . metolazone  5 mg Oral Daily  . sevelamer carbonate  800 mg Oral TID WC  . tamsulosin  0.4 mg Oral QPC supper   Continuous Infusions: . ferric gluconate (FERRLECIT/NULECIT) IV    . furosemide Stopped (08/10/20 2147)    Antimicrobials: Anti-infectives (From admission, onward)   Start     Dose/Rate Route Frequency Ordered Stop   08/11/20 0800  ceFAZolin (ANCEF) IVPB 2g/100 mL premix       Note to Pharmacy: Send with pt to OR   2 g 200 mL/hr over 30 Minutes Intravenous To Short Stay 08/11/20 0152 08/11/20 0754   08/11/20 0000  ceFAZolin (ANCEF) IVPB 1 g/50 mL premix  Status:  Discontinued       Note to Pharmacy: Send with pt to OR   1 g 100 mL/hr over 30 Minutes Intravenous On call 08/10/20 0937 08/10/20 1319   08/11/20 0000  ceFAZolin (ANCEF) IVPB 2g/100 mL premix  Status:  Discontinued       Note to Pharmacy: Send with pt to OR   2 g 200 mL/hr over 30 Minutes Intravenous On call 08/10/20 1319 08/11/20 0152   08/07/20 1415  ceFAZolin (ANCEF) IVPB 2g/100 mL premix        2 g 200 mL/hr over 30 Minutes Intravenous To Radiology 08/07/20 1405 08/07/20 1649     Objective: Vitals: Today's Vitals   08/11/20 0945 08/11/20 1013 08/11/20 1100 08/11/20 1145  BP: (!) 149/99 (!) 161/105    Pulse: 71 75    Resp: 18 17    Temp:  97.7 F (36.5 C)    TempSrc:  Oral    SpO2: 93% 96%    Weight:       Height:      PainSc:  0-No pain 0-No pain Asleep    Intake/Output Summary (Last 24 hours) at 08/11/2020 1146 Last data filed at 08/11/2020 1144 Gross per 24 hour  Intake 304.28 ml  Output 860 ml  Net -555.72 ml   Filed Weights   08/09/20 1029 08/10/20 0441 08/11/20 0500  Weight: (P) 109.8 kg 107.4 kg 105.9 kg   Weight change: -6.1 kg  Intake/Output from previous day: 01/30 0701 - 01/31 0700 In: -  Out: 850 [Urine:850] Intake/Output this shift: Total I/O In: 304.3 [I.V.:100; IV Piggyback:204.3] Out: 10 [Blood:10]  Filed Weights   08/09/20 1029 08/10/20 0441 08/11/20 0500  Weight: (P) 109.8 kg 107.4 kg 105.9 kg    Examination: General exam: Old for his age,NAD,sleeping HEENT:Oral mucosa moist, Ear/Nose WNL grossly, dentition normal. Respiratory system: bilaterally clear, TDC+ no wheezing or crackles,no use of accessory muscle Cardiovascular system: S1 & S2 +, No JVD,. Gastrointestinal system: Abdomen soft, NT,ND, BS+ Nervous System:Alert, awake, moving extremities and grossly nonfocal Extremities: mild leg edema, distal peripheral pulses palpable.  Skin: No rashes,no icterus. MSK: Normal muscle bulk,tone, power  Data Reviewed: I have personally reviewed following labs and imaging studies CBC: Recent Labs  Lab 08/07/20 0258 08/08/20 0534 08/08/20 2150 08/09/20 0228 08/09/20 2342 08/11/20 0449  WBC 6.5 5.3  --  6.8 6.2 6.5  HGB 7.8* 6.8* 7.5* 7.3* 7.3* 7.8*  HCT 25.9* 22.4* 24.1* 22.6* 22.5* 24.4*  MCV 86.3 85.5  --  83.4 82.7 83.3  PLT 336 311  --  305 296 [id number]   Basic Metabolic Panel: Recent Labs  Lab 08/07/20 0258 08/07/20 1806 08/08/20 0534 08/09/20 0228 08/09/20 2114 08/09/20 2342 08/11/20 0449  NA 136  --  139 139 137 139 136  K 4.9  --  5.1 4.8 4.5 4.5 4.2  CL 107  --  109 109 103 106 102  CO2 15*  --  18* 18* '22 22 23  '$ GLUCOSE 151*  --  106* 114* 121* 120* 113*  BUN 91*  --  91* 89* 68* 69* 74*  CREATININE 9.77*  --  10.32* 10.18* 8.53* 8.43*  8.89*  CALCIUM 7.6*  --  7.3*  7.2* 7.1* 6.9* 6.9* 7.1*  MG 1.8  --   --   --   --   --   --   PHOS 6.5* 7.0*  --   --   --   --   --    GFR: Estimated Creatinine Clearance: 14.6 mL/min (A) (by C-G formula based on SCr of 8.89 mg/dL (H)). Liver Function Tests: Recent Labs  Lab 08/06/20 2012 08/09/20 2114  AST 20 11*  ALT 39 12  ALKPHOS 113 99  BILITOT 0.5 0.5  PROT 6.0* 5.4*  ALBUMIN 2.3* 2.1*   Recent Labs  Lab 08/06/20 2012  LIPASE 23   No results for input(s): AMMONIA in the last 168 hours. Coagulation Profile: Recent Labs  Lab 08/11/20 0449  INR 1.1   Cardiac Enzymes: No results for input(s): CKTOTAL, CKMB, CKMBINDEX, TROPONINI in the last 168 hours. BNP (last 3 results) No results for input(s): PROBNP in the last 8760 hours. HbA1C: No results for input(s): HGBA1C in the last 72 hours. CBG: Recent Labs  Lab 08/10/20 2017 08/11/20 0009 08/11/20 0332 08/11/20 0907 08/11/20 1010  GLUCAP 116* 132* 108* 107* 102*   Lipid Profile: No results for input(s): CHOL, HDL, LDLCALC, TRIG, CHOLHDL, LDLDIRECT in the last 72 hours. Thyroid Function Tests: No results for input(s): TSH, T4TOTAL, FREET4, T3FREE, THYROIDAB in the last 72 hours. Anemia Panel: No results for input(s): VITAMINB12, FOLATE, FERRITIN, TIBC, IRON, RETICCTPCT in the last 72 hours. Sepsis Labs: No results for input(s): PROCALCITON, LATICACIDVEN in the last 168 hours.  Recent Results (from the past 240 hour(s))  SARS Coronavirus 2 by RT PCR (hospital order, performed in [facility] hospital lab) Nasopharyngeal Nasopharyngeal Swab     Status: None   Collection Time: 08/06/20  8:08 PM   Specimen: Nasopharyngeal Swab  Result Value Ref Range Status   SARS Coronavirus 2 NEGATIVE NEGATIVE Final    Comment: (  NOTE) SARS-CoV-2 target nucleic acids are NOT DETECTED.  The SARS-CoV-2 RNA is generally detectable in upper and lower respiratory specimens during the acute phase of infection. The  lowest concentration of SARS-CoV-2 viral copies this assay can detect is 250 copies / mL. A negative result does not preclude SARS-CoV-2 infection and should not be used as the sole basis for treatment or other patient management decisions.  A negative result may occur with improper specimen collection / handling, submission of specimen other than nasopharyngeal swab, presence of viral mutation(s) within the areas targeted by this assay, and inadequate number of viral copies (<250 copies / mL). A negative result must be combined with clinical observations, patient history, and epidemiological information.  Fact Sheet for Patients:   StrictlyIdeas.no  Fact Sheet for Healthcare Providers: BankingDealers.co.za  This test is not yet approved or  cleared by the [place] FDA and has been authorized for detection and/or diagnosis of SARS-CoV-2 by FDA under an Emergency Use Authorization (EUA).  This EUA will remain in effect (meaning this test can be used) for the duration of the COVID-19 declaration under Section 564(b)(1) of the Act, 21 U.S.C. section 360bbb-3(b)(1), unless the authorization is terminated or revoked sooner.  Performed at [facility], [place] [address], [place], [place] [address]   MRSA PCR Screening     Status: None   Collection Time: 08/11/20  5:15 AM   Specimen: Nasopharyngeal  Result Value Ref Range Status   MRSA by PCR NEGATIVE NEGATIVE Final    Comment:        The GeneXpert MRSA Assay (FDA approved for NASAL specimens only), is one component of a comprehensive MRSA colonization surveillance program. It is not intended to diagnose MRSA infection nor to guide or monitor treatment for MRSA infections. Performed at [facility], [place] [address], [place], [place] [address]      Radiology Studies: DG CHEST PORT 1 VIEW  Result Date: 08/09/2020 CLINICAL DATA:  Chest pain in. EXAM: PORTABLE CHEST 1  VIEW COMPARISON:  Radiograph 2 days ago 08/07/2020 FINDINGS: Interval placement of right-sided dialysis catheter with tip overlying the atrial caval junction. Unchanged cardiomegaly. Increase fluid in the fissures. Small pleural effusions. Mild vascular congestion. No pneumothorax or confluent airspace disease. No acute osseous abnormalities are seen. IMPRESSION: 1. Interval placement of right-sided dialysis catheter with tip overlying the atrial caval junction. 2. Unchanged cardiomegaly. Vascular congestion. Increasing fluid in the fissures. Small pleural effusions are grossly stable. Electronically Signed   By: Keith Rake M.D.   On: 08/09/2020 21:10     LOS: 4 days   Antonieta Pert, MD [facility]  08/11/2020, 11:46 AM

## 2020-08-11 NOTE — Plan of Care (Signed)

## 2020-08-11 NOTE — Progress Notes (Signed)
Renal Navigator received notification from Nephrologist that patient needs outpatient HD for ESRD. Navigator attempted to reach patient, however, notes that he was in the OR this morning. He could not be reached on room phone or cell. Referral submitted to [facility] as patient follows with Dr. Candiss Norse at [facility]. [facility] appears to be closest to [name] unsure if this clinic is able to accommodate a new start at this time, but will follow closely. Navigator notes that HepB panel was discontinued and has asked that it be re-ordered, as HepB surface antigen is required for referral to be complete. Navigator notes that seat will be delayed as patient is uninsured and will require financial clearance.  Navigator will follow closely.  [name], [place] Renal Navigator [phone]

## 2020-08-11 NOTE — Plan of Care (Signed)
  Problem: Health Behavior/Discharge Planning: Goal: Ability to manage health-related needs will improve Outcome: Progressing   Problem: Clinical Measurements: Goal: Ability to maintain clinical measurements within normal limits will improve Outcome: Progressing Goal: Will remain free from infection Outcome: Progressing Goal: Diagnostic test results will improve Outcome: Progressing   

## 2020-08-11 NOTE — Progress Notes (Signed)
01/31 0618 Pt transported to OR. Pt denies any pain. Pt stable. Attempted call to CRNA Bonnell Public, Merrilyn Puma and Eugenia Pancoast for report  unsuccessful.

## 2020-08-11 NOTE — Discharge Instructions (Signed)
° °  [facility]  Discharge Instructions  AV Fistula or Graft Surgery for Dialysis Access  Please refer to the following instructions for your post-procedure care. Your surgeon or physician assistant will discuss any changes with you.  Activity  You may drive the day following your surgery, if you are comfortable and no longer taking prescription pain medication. Resume full activity as the soreness in your incision resolves.  Bathing/Showering  You may shower after you go home. Keep your incision dry for 48 hours. Do not soak in a bathtub, hot tub, or swim until the incision heals completely. You may not shower if you have a hemodialysis catheter.  Incision Care  Clean your incision with mild soap and water after 48 hours. Pat the area dry with a clean towel. You do not need a bandage unless otherwise instructed. Do not apply any ointments or creams to your incision. You may have skin glue on your incision. Do not peel it off. It will come off on its own in about one week. Your arm may swell a bit after surgery. To reduce swelling use pillows to elevate your arm so it is above your heart. Your doctor will tell you if you need to lightly wrap your arm with an ACE bandage.  Diet  Resume your normal diet. There are not special food restrictions following this procedure. In order to heal from your surgery, it is CRITICAL to get adequate nutrition. Your body requires vitamins, minerals, and protein. Vegetables are the best source of vitamins and minerals. Vegetables also provide the perfect balance of protein. Processed food has little nutritional value, so try to avoid this.  Medications  Resume taking all of your medications. If your incision is causing pain, you may take over-the counter pain relievers such as acetaminophen (Tylenol). If you were prescribed a stronger pain medication, please be aware these medications can cause nausea and constipation. Prevent  nausea by taking the medication with a snack or meal. Avoid constipation by drinking plenty of fluids and eating foods with high amount of fiber, such as fruits, vegetables, and grains.  Do not take Tylenol if you are taking prescription pain medications.  Follow up Your surgeon may want to see you in the office following your access surgery. If so, this will be arranged at the time of your surgery.  Please call us immediately for any of the following conditions:   Increased pain, redness, drainage (pus) from your incision site  Fever of 101 degrees or higher  Severe or worsening pain at your incision site  Hand pain or numbness.   Reduce your risk of vascular disease:   Stop smoking. If you would like help, call [facility] at 1-800-QUIT-NOW 714-335-3912) or [place] at [place] your cholesterol  Maintain a desired weight  Control your diabetes  Keep your blood pressure down  Dialysis  It will take several weeks to several months for your new dialysis access to be ready for use. Your surgeon will determine when it is okay to use it. Your nephrologist will continue to direct your dialysis. You can continue to use your Permcath until your new access is ready for use.   08/11/2020 TAHJ EWOLDT [id number] 1985/10/13  Surgeon(s): Cherre Robins, MD  Procedure(s): Left radial cephalic AV fistula creation  x Do not stick fistula for 12 weeks    If you have any questions, please call the office at [phone].

## 2020-08-11 NOTE — Progress Notes (Signed)
   ASSESSMENT & PLAN:  [name] is a 35 y.o. male nearing ESRD. In need of permanent HD access. [place] already in place. Left wrist cephalic vein appears appropriate for AVF. Will plan on left radiocephalic AVF today in OR.   SUBJECTIVE:  No complaints. Ready for OR.  OBJECTIVE:  BP (!) 165/100 (BP Location: Left Arm) Comment: RN notified  Pulse 79   Temp 98 F (36.7 C) (Oral)   Resp 16   Ht 6' (1.829 m)   Wt 105.9 kg   SpO2 99%   BMI 31.66 kg/m   Intake/Output Summary (Last 24 hours) at 08/11/2020 0729 Last data filed at 08/10/2020 1600 Gross per 24 hour  Intake --  Output 850 ml  Net -850 ml    2+ radial pulses Cephalic vein appears healthy in L arm  CBC Latest Ref Rng & Units 08/11/2020 08/09/2020 08/09/2020  WBC 4.0 - 10.5 K/uL 6.5 6.2 6.8  Hemoglobin 13.0 - 17.0 g/dL 7.8(L) 7.3(L) 7.3(L)  Hematocrit 39.0 - 52.0 % 24.4(L) 22.5(L) 22.6(L)  Platelets 150 - 400 K/uL 306 296 305     CMP Latest Ref Rng & Units 08/11/2020 08/09/2020 08/09/2020  Glucose 70 - 99 mg/dL 113(H) 120(H) 121(H)  BUN 6 - 20 mg/dL 74(H) 69(H) 68(H)  Creatinine 0.61 - 1.24 mg/dL 8.89(H) 8.43(H) 8.53(H)  Sodium 135 - 145 mmol/L 136 139 137  Potassium 3.5 - 5.1 mmol/L 4.2 4.5 4.5  Chloride 98 - 111 mmol/L 102 106 103  CO2 22 - 32 mmol/L '23 22 22  '$ Calcium 8.9 - 10.3 mg/dL 7.1(L) 6.9(L) 6.9(L)  Total Protein 6.5 - 8.1 g/dL - - 5.4(L)  Total Bilirubin 0.3 - 1.2 mg/dL - - 0.5  Alkaline Phos 38 - 126 U/L - - 99  AST 15 - 41 U/L - - 11(L)  ALT 0 - 44 U/L - - 12    Estimated Creatinine Clearance: 14.6 mL/min (A) (by C-G formula based on SCr of 8.89 mg/dL (H)).  [name]. Stanford Breed, MD [facility] Phone Number: [phone] 08/11/2020 7:29 AM

## 2020-08-11 NOTE — Transfer of Care (Signed)
Immediate Anesthesia Transfer of Care Note  Patient: [name]  Procedure(s) Performed: LEFT UPPER EXTREMITY ARTERIOVENOUS (AV) FISTULA CREATION (Left Arm Upper)  Patient Location: PACU  Anesthesia Type:MAC and Regional  Level of Consciousness: awake, alert  and oriented  Airway & Oxygen Therapy: Patient Spontanous Breathing  Post-op Assessment: Report given to RN and Post -op Vital signs reviewed and stable  Post vital signs: Reviewed and stable  Last Vitals:  Vitals Value Taken Time  BP 158/86 08/11/20 0904  Temp    Pulse 77 08/11/20 0904  Resp 15 08/11/20 0904  SpO2 94 % 08/11/20 0904  Vitals shown include unvalidated device data.  Last Pain:  Vitals:   08/10/20 2014  TempSrc: Oral  PainSc:          Complications: No complications documented.

## 2020-08-11 NOTE — Progress Notes (Signed)
[facility] Progress Note   Subjective:    At least 850 of UOP recorded for yesterday-  S/p AVF this AM-  He is resting without complaints at present  Objective Vitals:   08/11/20 0939 08/11/20 0941 08/11/20 0945 08/11/20 1013  BP:   (!) 149/99 (!) 161/105  Pulse:  72 71 75  Resp:  (!) '25 18 17  '$ Temp: (!) 97 F (36.1 C)   97.7 F (36.5 C)  TempSrc:    Oral  SpO2:  96% 93% 96%  Weight:      Height:       Physical Exam General: comfortable flat in bed Heart: RRR no rub Lungs: normal WOB on RA Abdomen: soft Extremities:3+ edema to hips bit some improvement; chronic wounds on dorsum of feet, no erythema or drainage Dialysis Access:  RIJ TDC c/d/i-  New left AVF-  Can feel bruit  Additional Objective Labs: Basic Metabolic Panel: Recent Labs  Lab 08/07/20 0258 08/07/20 1806 08/08/20 0534 08/09/20 2114 08/09/20 2342 08/11/20 0449  NA 136  --    < > 137 139 136  K 4.9  --    < > 4.5 4.5 4.2  CL 107  --    < > 103 106 102  CO2 15*  --    < > '22 22 23  '$ GLUCOSE 151*  --    < > 121* 120* 113*  BUN 91*  --    < > 68* 69* 74*  CREATININE 9.77*  --    < > 8.53* 8.43* 8.89*  CALCIUM 7.6*  --    < > 6.9* 6.9* 7.1*  PHOS 6.5* 7.0*  --   --   --   --    < > = values in this interval not displayed.   Liver Function Tests: Recent Labs  Lab 08/06/20 2012 08/09/20 2114  AST 20 11*  ALT 39 12  ALKPHOS 113 99  BILITOT 0.5 0.5  PROT 6.0* 5.4*  ALBUMIN 2.3* 2.1*   Recent Labs  Lab 08/06/20 2012  LIPASE 23   CBC: Recent Labs  Lab 08/07/20 0258 08/08/20 0534 08/08/20 2150 08/09/20 0228 08/09/20 2342 08/11/20 0449  WBC 6.5 5.3  --  6.8 6.2 6.5  HGB 7.8* 6.8*   < > 7.3* 7.3* 7.8*  HCT 25.9* 22.4*   < > 22.6* 22.5* 24.4*  MCV 86.3 85.5  --  83.4 82.7 83.3  PLT 336 311  --  305 296 306   < > = values in this interval not displayed.   Blood Culture    Component Value Date/Time   SDES ABSCESS FINGER LEFT 07/16/2014 2000   SDES ABSCESS FINGER LEFT  07/16/2014 2000   SPECREQUEST NONE 07/16/2014 2000   SPECREQUEST NONE 07/16/2014 2000   CULT  07/16/2014 2000    NO ANAEROBES ISOLATED Performed at [place]  07/16/2014 2000    MODERATE STREPTOCOCCUS GROUP F Performed at [place] 07/22/2014 FINAL 07/16/2014 2000   REPTSTATUS 07/19/2014 FINAL 07/16/2014 2000    Cardiac Enzymes: No results for input(s): CKTOTAL, CKMB, CKMBINDEX, TROPONINI in the last 168 hours. CBG: Recent Labs  Lab 08/10/20 2017 08/11/20 0009 08/11/20 0332 08/11/20 0907 08/11/20 1010  GLUCAP 116* 132* 108* 107* 102*   Iron Studies:  No results for input(s): IRON, TIBC, TRANSFERRIN, FERRITIN in the last 72 hours. '@lablastinr3'$ @ Studies/Results: DG CHEST PORT 1 VIEW  Result Date: 08/09/2020 CLINICAL DATA:  Chest pain in. EXAM: PORTABLE CHEST 1 VIEW COMPARISON:  Radiograph 2 days ago 08/07/2020 FINDINGS: Interval placement of right-sided dialysis catheter with tip overlying the atrial caval junction. Unchanged cardiomegaly. Increase fluid in the fissures. Small pleural effusions. Mild vascular congestion. No pneumothorax or confluent airspace disease. No acute osseous abnormalities are seen. IMPRESSION: 1. Interval placement of right-sided dialysis catheter with tip overlying the atrial caval junction. 2. Unchanged cardiomegaly. Vascular congestion. Increasing fluid in the fissures. Small pleural effusions are grossly stable. Electronically Signed   By: Keith Rake M.D.   On: 08/09/2020 21:10   Medications: . ferric gluconate (FERRLECIT/NULECIT) IV    . furosemide 160 mg (08/10/20 1824)   . carvedilol  6.25 mg Oral BID WC  . Chlorhexidine Gluconate Cloth  6 each Topical Q0600  . heparin  5,000 Units Subcutaneous Q8H  . insulin aspart  0-6 Units Subcutaneous Q4H  . losartan  25 mg Oral Daily  . metolazone  5 mg Oral Daily  . sevelamer carbonate  800 mg Oral TID WC  . tamsulosin  0.4 mg Oral QPC supper     Assessment/Plan **new ESRD:  Follows with Dr. Daiva Huge but no appt since 02/2020 due to missed appts.  This appears to be progressive renal decline to point of needing to initiate dialysis > ESRD.  S/p [facility] placement 08/08/20.  HD #1 1/29. VVS for perm access - saw and plan AVF 08/11/20, appreciate. Plan HD #2 2/1, ordered. CLIP underway.  **volume overload: cont IV lasix and metolazone 5 daily for diuresis in between HD treatments given marked overload; UF with HD;  Strict I/Os. Restrict sodium, fluid.   **HTN urgency:  Remains high.  Should improve with UF but in the meantime added losartan 25 daily - K has been ok and he has intolerance to hydralazine and amlodipine; cont coreg.   **Metabolic acidosis: resolved, d/c po bicarb  **Anemia:  Hb 7.3 which is recent Baseline.  Iron deficient > giving course of IV iron.  Hemoccult ordered.  Start ESA    **BMM: phos 7.0, PTH 113.  Renal diet and sevelamer 1 TIDAC.   **DM: per primary.  **RLS: replete iron  Louis Meckel  08/11/2020, 10:33 AM  [facility] Pager: [phone]

## 2020-08-11 NOTE — Anesthesia Postprocedure Evaluation (Signed)
Anesthesia Post Note  Patient: [name]  Procedure(s) Performed: LEFT UPPER EXTREMITY ARTERIOVENOUS (AV) FISTULA CREATION (Left Arm Upper)     Patient location during evaluation: PACU Anesthesia Type: Regional and MAC Level of consciousness: awake and alert Pain management: pain level controlled Vital Signs Assessment: post-procedure vital signs reviewed and stable Respiratory status: spontaneous breathing, nonlabored ventilation, respiratory function stable and patient connected to nasal cannula oxygen Cardiovascular status: stable and blood pressure returned to baseline Postop Assessment: no apparent nausea or vomiting Anesthetic complications: no   No complications documented.  Last Vitals:  Vitals:   08/11/20 0945 08/11/20 1013  BP: (!) 149/99 (!) 161/105  Pulse: 71 75  Resp: 18 17  Temp:  36.5 C  SpO2: 93% 96%    Last Pain:  Vitals:   08/11/20 1323  TempSrc:   PainSc: Asleep                 [name]

## 2020-08-11 NOTE — Op Note (Signed)
DATE OF SERVICE: 08/11/2020  PATIENT:  [name]  35 y.o. male  PRE-OPERATIVE DIAGNOSIS:  CKDV nearing ESRD  POST-OPERATIVE DIAGNOSIS:  Same  PROCEDURE:   Left upper extremity radiocephalic arteriovenous fistula  SURGEON:  Surgeon(s) and Role:    * Cherre Robins, MD - Primary  ASSISTANT: Leontine Locket, PA-C  An assistant was required to facilitate exposure and expedite the case.  ANESTHESIA:   regional and IV sedation  EBL: min  BLOOD ADMINISTERED:none  DRAINS: none   LOCAL MEDICATIONS USED:  NONE  SPECIMEN:  none  COUNTS: confirmed correct .  TOURNIQUET:  none  PATIENT DISPOSITION:  PACU - hemodynamically stable.   Delay start of Pharmacological VTE agent (>24hrs) due to surgical blood loss or risk of bleeding: no  INDICATION FOR PROCEDURE: [name] is a 35 y.o. male with acute on chronic kidney disease nearing ESRD. After careful discussion of risks, benefits, and alternatives the patient was offered left upper extremity arteriovenous fistula. We specifically discussed risk of steal syndrome. The patient understood and wished to proceed.  OPERATIVE FINDINGS: adequate   DESCRIPTION OF PROCEDURE: After identification of the patient in the pre-operative holding area, the patient was transferred to the operating room. The patient was positioned supine on the operating room table. Anesthesia was induced. The left arm was prepped and draped in standard fashion. A surgical pause was performed confirming correct patient, procedure, and operative location.  Using intraoperative ultrasound, the course of the left upper extremity cephalic and radial artery were mapped.  A longitudinal incision was planned in the left wrist to expose both the cephalic vein and the radial artery.  Incision was carried down through subcutaneous tissue until the cephalic vein was identified and skeletonized.  Incision was carried through the fascia.  The radial tear bundle was  identified.  The radial artery was encircled proximally distally with Silastic Vesseloops.  Patient was heparinized with 5000 units of IV heparin.  The distal cephalic vein was clamped with a right angle and transected.  The distal stump was oversewn with a 2-0 silk.  A bulldog clamp was applied to the proximal cephalic vein.  The Vesseloops were secured.  A longitudinal arteriotomy was made with an 11 blade.  An end-to-side anastomosis was performed with a 6-0 Prolene suture.  Immediately prior to completion the anastomosis was flushed and de-aired.  Clamps were released.  Hemostasis was achieved.  The fistula was interrogated with Doppler.  Audible thrill was heard over the fistula.  A diminished but present radial pulse was noted distal to the fistula.  Wound was closed in layers using 3-0 Vicryl and 4-0 Monocryl.  Steri-Strips were applied.  A clean dressing was applied  Upon completion of the case instrument and sharps counts were confirmed correct. The patient was transferred to the PACU in good condition. I was present for all portions of the procedure.  [name]. Stanford Breed, MD [facility] Phone Number: [phone] 08/11/2020 8:53 AM

## 2020-08-11 NOTE — Anesthesia Procedure Notes (Signed)
Procedure Name: MAC Date/Time: 08/11/2020 7:45 AM Performed by: Dorthea Cove, CRNA Pre-anesthesia Checklist: Patient identified, Emergency Drugs available, Suction available, Patient being monitored and Timeout performed Patient Re-evaluated:Patient Re-evaluated prior to induction Oxygen Delivery Method: Simple face mask Preoxygenation: Pre-oxygenation with 100% oxygen Induction Type: IV induction Placement Confirmation: positive ETCO2 and CO2 detector Dental Injury: Teeth and Oropharynx as per pre-operative assessment

## 2020-08-11 NOTE — Anesthesia Procedure Notes (Signed)
Anesthesia Regional Block: Supraclavicular block   Pre-Anesthetic Checklist: ,, timeout performed, Correct Patient, Correct Site, Correct Laterality, Correct Procedure, Correct Position, site marked, Risks and benefits discussed,  Surgical consent,  Pre-op evaluation,  At surgeon's request and post-op pain management  Laterality: Left  Prep: Dura Prep       Needles:  Injection technique: Single-shot  Needle Type: Echogenic Stimulator Needle     Needle Length: 5cm  Needle Gauge: 20     Additional Needles:   Procedures:,,,, ultrasound used (permanent image in chart),,,,  Narrative:  Start time: 08/11/2020 7:00 AM End time: 08/11/2020 7:05 AM Injection made incrementally with aspirations every 5 mL.  Performed by: Personally  Anesthesiologist: Darral Dash, DO  Additional Notes: Patient identified. Risks/Benefits/Options discussed with patient including but not limited to bleeding, infection, nerve damage, failed block, incomplete pain control. Patient expressed understanding and wished to proceed. All questions were answered. Sterile technique was used throughout the entire procedure. Please see nursing notes for vital signs. Aspirated in 5cc intervals with injection for negative confirmation. Patient was given instructions on fall risk and not to get out of bed. All questions and concerns addressed with instructions to call with any issues or inadequate analgesia.

## 2020-08-12 ENCOUNTER — Encounter (HOSPITAL_COMMUNITY): Payer: Self-pay | Admitting: Vascular Surgery

## 2020-08-12 LAB — GLUCOSE, CAPILLARY
Glucose-Capillary: 112 mg/dL — ABNORMAL HIGH (ref 70–99)
Glucose-Capillary: 114 mg/dL — ABNORMAL HIGH (ref 70–99)
Glucose-Capillary: 120 mg/dL — ABNORMAL HIGH (ref 70–99)
Glucose-Capillary: 122 mg/dL — ABNORMAL HIGH (ref 70–99)
Glucose-Capillary: 127 mg/dL — ABNORMAL HIGH (ref 70–99)
Glucose-Capillary: 133 mg/dL — ABNORMAL HIGH (ref 70–99)

## 2020-08-12 MED ORDER — DARBEPOETIN ALFA 150 MCG/0.3ML IJ SOSY
PREFILLED_SYRINGE | INTRAMUSCULAR | Status: AC
  Administered 2020-08-12: 150 ug via INTRAVENOUS
  Filled 2020-08-12: qty 0.3

## 2020-08-12 MED ORDER — SODIUM CHLORIDE 0.9 % IV SOLN
125.0000 mg | INTRAVENOUS | Status: DC
  Administered 2020-08-12: 125 mg via INTRAVENOUS
  Filled 2020-08-12: qty 10

## 2020-08-12 MED ORDER — SODIUM CHLORIDE 0.9 % IV SOLN
INTRAVENOUS | Status: DC | PRN
  Administered 2020-08-12: 250 mL via INTRAVENOUS

## 2020-08-12 MED ORDER — HEPARIN SODIUM (PORCINE) 1000 UNIT/ML IJ SOLN
INTRAMUSCULAR | Status: AC
  Filled 2020-08-12: qty 4

## 2020-08-12 MED ORDER — INSULIN ASPART 100 UNIT/ML ~~LOC~~ SOLN: 0.0000 [IU] | Freq: Three times a day (TID) | SUBCUTANEOUS | Status: DC

## 2020-08-12 NOTE — Plan of Care (Signed)
  Problem: Education: Goal: Knowledge of General Education information will improve Description: Including pain rating scale, medication(s)/side effects and non-pharmacologic comfort measures Outcome: Progressing   Problem: Clinical Measurements: Goal: Respiratory complications will improve Outcome: Progressing Goal: Cardiovascular complication will be avoided Outcome: Progressing   Problem: Activity: Goal: Risk for activity intolerance will decrease Outcome: Progressing   Problem: Nutrition: Goal: Adequate nutrition will be maintained Outcome: Progressing   Problem: Coping: Goal: Level of anxiety will decrease Outcome: Progressing   Problem: Pain Managment: Goal: General experience of comfort will improve Outcome: Progressing

## 2020-08-12 NOTE — Progress Notes (Signed)
[facility]  PROGRESS NOTE    [name]  [id number] DOB: 1985/12/16 DOA: 08/06/2020 PCP: Patient, No Pcp Per   Chief Complaint  Patient presents with  . Leg Swelling  . 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  Hyperkalemia 5.2 metabolic acidosis BUN 89 creatinine 9.9, anemia COVID-19 negative BNP elevated 2067. Patient placed on IV Lasix and admitted. Nephrology was consulted Patient continued ON diuresis Patient underwent first dialysis 08/09/2020 1/31: s/p AVF  Subjective: Seen this a.m.  He is asking when he can go home.  No new complaints.  Assessment & Plan:  New ESRD: Status post HD first session 1/29 with [facility], status post aVF 1/31st, going for  HD #2 today - CLIP underway social worker following.  Blood pressure nephrology input  Volume overload due to ESRD.  Nicely improved.  Continue diuretic regimen and dialysis as per nephrology.  Monitor intake output Daily weight as below.    Net IO Since Admission: -4,964.33 mL [08/12/20 1441]  Filed Weights   08/11/20 0500 08/12/20 0404 08/12/20 1145  Weight: 105.9 kg 105.9 kg 106.9 kg   Hypertension urgency in the setting of fluid overload and missed meds.  Blood pressure remains poorly controlled-will likely improve with ongoing dialysis.Continue Coreg, continue losartan (new), unable to tolerate amlodipine and hydralazine.  Continue per nephrology  Urine retention continue Flomax.  Voiding well.  Metabolic acidosis/uremic symptoms in the setting of #1 resolved.  Off oral bicarb.  Anemia likely from chronic renal disease low serum iron, on ESA and IV iron as per nephrology.  H&H  dropped to 6.8, s/p 1 unit PRBC 1/28 H&H overall stable.  Monitor H&H, transfuse if less than 7 g.  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  Morbid obesity BMI 31.BMI improving with diuresis.  He will benefit with outpatient weight loss strategy and PCP follow-up.    Restless leg syndrome replete iron  Nutrition: Diet Order            Diet renal with fluid restriction Fluid restriction: 1200 mL Fluid; Room service appropriate? Yes; Fluid consistency: Thin  Diet effective now                 Body mass index is 31.96 kg/m.  DVT prophylaxis: heparin injection 5,000 Units Start: 08/11/20 2200 Code Status:   Code Status: Full Code  Family Communication: plan of care discussed with patient at bedside.  Status is: Inpatient Remains inpatient appropriate because:Inpatient level of care appropriate due to severity of illness and For ongoing management of fluid overload CKD, anemia  Dispo: The patient is from: Home              Anticipated d/c is to: Home              Anticipated  d/c date is: once outpatient dialysis set up with negative              Patient currently is medically stable for discahrge   Difficult to place patient No  Consultants:see note  Procedures:see note  Culture/Microbiology    Component Value Date/Time   SDES ABSCESS FINGER LEFT 07/16/2014 2000   SDES ABSCESS FINGER LEFT 07/16/2014 2000   SPECREQUEST NONE 07/16/2014 2000   [place] NONE 07/16/2014 2000   CULT  07/16/2014 2000    NO ANAEROBES  ISOLATED Performed at [place]  07/16/2014 [place] F Performed at [place] 07/22/2014 FINAL 07/16/2014 2000   REPTSTATUS 07/19/2014 FINAL 07/16/2014 2000    Other culture-see note  Medications: Scheduled Meds: . carvedilol  6.25 mg Oral BID WC  . Chlorhexidine Gluconate Cloth  6 each Topical Q0600  . darbepoetin (ARANESP) injection - DIALYSIS  150 mcg Intravenous Q Tue-HD  . heparin  5,000 Units Subcutaneous Q8H  . heparin sodium (porcine)      . insulin aspart  0-6 Units Subcutaneous Q4H  . losartan  25 mg Oral Daily  . metolazone  5 mg Oral Daily  . sevelamer carbonate  800 mg Oral TID WC  . tamsulosin  0.4 mg Oral QPC supper   Continuous Infusions: . sodium chloride Stopped (08/12/20 1117)  . ferric gluconate (FERRLECIT/NULECIT) IV 125 mg (08/12/20 1404)  . furosemide Stopped (08/12/20 1042)    Antimicrobials: Anti-infectives (From admission, onward)   Start     Dose/Rate Route Frequency Ordered Stop   08/11/20 0800  ceFAZolin (ANCEF) IVPB 2g/100 mL premix       Note to Pharmacy: Send with pt to OR   2 g 200 mL/hr over 30 Minutes Intravenous To Short Stay 08/11/20 0152 08/11/20 0754   08/11/20 0000  ceFAZolin (ANCEF) IVPB 1 g/50 mL premix  Status:  Discontinued       Note to Pharmacy: Send with pt to OR   1 g 100 mL/hr over 30 Minutes Intravenous On call 08/10/20 0937 08/10/20 1319   08/11/20 0000  ceFAZolin (ANCEF) IVPB 2g/100 mL premix  Status:  Discontinued       Note to Pharmacy: Send with pt to OR   2 g 200 mL/hr over 30 Minutes Intravenous On call 08/10/20 1319 08/11/20 0152   08/07/20 1415  ceFAZolin (ANCEF) IVPB 2g/100 mL premix        2 g 200 mL/hr over 30 Minutes Intravenous To Radiology 08/07/20 1405 08/07/20 1649     Objective: Vitals: Today's Vitals   08/12/20 1152 08/12/20 1200 08/12/20 1230 08/12/20 1423  BP: (!) 172/102 (!) 176/100 (!) 187/107   Pulse:      Resp: (!)  22 (!) '23 19 18  '$ Temp:    98.6 F (37 C)  TempSrc:    Oral  SpO2:      Weight:      Height:      PainSc:        Intake/Output Summary (Last 24 hours) at 08/12/2020 1441 Last data filed at 08/12/2020 1423 Gross per 24 hour  Intake 858.07 ml  Output 2000 ml  Net -1141.93 ml   Filed Weights   08/11/20 0500 08/12/20 0404 08/12/20 1145  Weight: 105.9 kg 105.9 kg 106.9 kg   Weight change: 0 kg  Intake/Output from previous day: 01/31 0701 - 02/01 0700 In: 370.3 [I.V.:100; IV Piggyback:270.3] Out:  10 [Blood:10] Intake/Output this shift: Total I/O In: 792.1 [P.O.:720; I.V.:6.1; IV Piggyback:66] Out: 2000 [Other:2000] Filed Weights   08/11/20 0500 08/12/20 0404 08/12/20 1145  Weight: 105.9 kg 105.9 kg 106.9 kg    Examination: General exam: AAOx3, old for age,NAD, weak appearing. HEENT:Oral mucosa moist, Ear/Nose WNL grossly, dentition normal. Respiratory system: bilaterally clear,no wheezing or crackles,no use of accessory muscle Cardiovascular system: S1 & S2 +, No JVD,. Gastrointestinal system: Abdomen soft, NT,ND, BS+ Nervous System:Alert, awake, moving extremities and grossly nonfocal Extremities: No edema, distal peripheral pulses palpable.  Skin: No rashes,no icterus. MSK: Normal muscle bulk,tone, power Fistula present on the arm.  Chest  W/ HD catheter.  Data Reviewed: I have personally reviewed following labs and imaging studies CBC: Recent Labs  Lab 08/07/20 0258 08/08/20 0534 08/08/20 2150 08/09/20 0228 08/09/20 2342 08/11/20 0449  WBC 6.5 5.3  --  6.8 6.2 6.5  HGB 7.8* 6.8* 7.5* 7.3* 7.3* 7.8*  HCT 25.9* 22.4* 24.1* 22.6* 22.5* 24.4*  MCV 86.3 85.5  --  83.4 82.7 83.3  PLT 336 311  --  305 296 [id number]   Basic Metabolic Panel: Recent Labs  Lab 08/07/20 0258 08/07/20 1806 08/08/20 0534 08/09/20 0228 08/09/20 2114 08/09/20 2342 08/11/20 0449  NA 136  --  139 139 137 139 136  K 4.9  --  5.1 4.8 4.5 4.5 4.2  CL 107  --  109 109 103 106 102  CO2 15*  --   18* 18* '22 22 23  '$ GLUCOSE 151*  --  106* 114* 121* 120* 113*  BUN 91*  --  91* 89* 68* 69* 74*  CREATININE 9.77*  --  10.32* 10.18* 8.53* 8.43* 8.89*  CALCIUM 7.6*  --  7.3*  7.2* 7.1* 6.9* 6.9* 7.1*  MG 1.8  --   --   --   --   --   --   PHOS 6.5* 7.0*  --   --   --   --   --    GFR: Estimated Creatinine Clearance: 14.6 mL/min (A) (by C-G formula based on SCr of 8.89 mg/dL (H)). Liver Function Tests: Recent Labs  Lab 08/06/20 2012 08/09/20 2114  AST 20 11*  ALT 39 12  ALKPHOS 113 99  BILITOT 0.5 0.5  PROT 6.0* 5.4*  ALBUMIN 2.3* 2.1*   Recent Labs  Lab 08/06/20 2012  LIPASE 23   No results for input(s): AMMONIA in the last 168 hours. Coagulation Profile: Recent Labs  Lab 08/11/20 0449  INR 1.1   Cardiac Enzymes: No results for input(s): CKTOTAL, CKMB, CKMBINDEX, TROPONINI in the last 168 hours. BNP (last 3 results) No results for input(s): PROBNP in the last 8760 hours. HbA1C: No results for input(s): HGBA1C in the last 72 hours. CBG: Recent Labs  Lab 08/11/20 2053 08/12/20 0017 08/12/20 0401 08/12/20 0809 08/12/20 1109  GLUCAP 113* 112* 114* 120* 127*   Lipid Profile: No results for input(s): CHOL, HDL, LDLCALC, TRIG, CHOLHDL, LDLDIRECT in the last 72 hours. Thyroid Function Tests: No results for input(s): TSH, T4TOTAL, FREET4, T3FREE, THYROIDAB in the last 72 hours. Anemia Panel: No results for input(s): VITAMINB12, FOLATE, FERRITIN, TIBC, IRON, RETICCTPCT in the last 72 hours. Sepsis Labs: No results for input(s): PROCALCITON, LATICACIDVEN in the last 168 hours.  Recent Results (from the past 240 hour(s))  SARS Coronavirus 2 by RT PCR (hospital order, performed in [facility] hospital lab) Nasopharyngeal Nasopharyngeal Swab     Status: None   Collection Time: 08/06/20  8:08 PM   Specimen: Nasopharyngeal Swab  Result Value Ref Range Status   SARS Coronavirus 2 NEGATIVE NEGATIVE Final    Comment: (NOTE) SARS-CoV-2 target nucleic acids are NOT  DETECTED.  The SARS-CoV-2 RNA is generally detectable in upper and lower respiratory specimens during the acute phase of infection. The lowest concentration of SARS-CoV-2 viral copies this assay can detect is 250 copies / mL. A negative result does not preclude SARS-CoV-2 infection and should not be used as the sole basis for treatment or other patient management decisions.  A negative result may occur with improper specimen collection / handling, submission of specimen other than nasopharyngeal swab, presence of viral mutation(s) within the areas targeted by this assay, and inadequate number of viral copies (<250 copies / mL). A negative result must be combined with clinical observations, patient history, and epidemiological information.  Fact Sheet for Patients:   StrictlyIdeas.no  Fact Sheet for Healthcare Providers: BankingDealers.co.za  This test is not yet approved or  cleared by the [place] FDA and has been authorized for detection and/or diagnosis of SARS-CoV-2 by FDA under an Emergency Use Authorization (EUA).  This EUA will remain in effect (meaning this test can be used) for the duration of the COVID-19 declaration under Section 564(b)(1) of the Act, 21 U.S.C. section 360bbb-3(b)(1), unless the authorization is terminated or revoked sooner.  Performed at [facility], [place] [address] [place], [place] [address]   MRSA PCR Screening     Status: None   Collection Time: 08/11/20  5:15 AM   Specimen: Nasopharyngeal  Result Value Ref Range Status   MRSA by PCR NEGATIVE NEGATIVE Final    Comment:        The GeneXpert MRSA Assay (FDA approved for NASAL specimens only), is one component of a comprehensive MRSA colonization surveillance program. It is not intended to diagnose MRSA infection nor to guide or monitor treatment for MRSA infections. Performed at [facility], [place] [address], [place], [place]  [address]      Radiology Studies: No results found.   LOS: 5 days   Antonieta Pert, MD [facility]  08/12/2020, 2:41 PM

## 2020-08-12 NOTE — Progress Notes (Signed)
[facility] Progress Note   Subjective:    no UOP recorded for yesterday-    He is resting without complaints at present.  Due for HD later today   Objective Vitals:   08/12/20 0028 08/12/20 0404 08/12/20 0808 08/12/20 0932  BP: (!) 147/89  (!) 169/107 (!) 155/90  Pulse: 83  82 83  Resp: 17  16   Temp: 98 F (36.7 C)  98.7 F (37.1 C)   TempSrc: Oral  Oral   SpO2: 99%  98%   Weight:  105.9 kg    Height:       Physical Exam General: comfortable flat in bed Heart: RRR no rub Lungs: normal WOB on RA Abdomen: soft Extremities:3+ edema to hips bit some improvement; chronic wounds on dorsum of feet, no erythema or drainage Dialysis Access:  RIJ TDC c/d/i-  New left AVF-  Can feel bruit  Additional Objective Labs: Basic Metabolic Panel: Recent Labs  Lab 08/07/20 0258 08/07/20 1806 08/08/20 0534 08/09/20 2114 08/09/20 2342 08/11/20 0449  NA 136  --    < > 137 139 136  K 4.9  --    < > 4.5 4.5 4.2  CL 107  --    < > 103 106 102  CO2 15*  --    < > '22 22 23  '$ GLUCOSE 151*  --    < > 121* 120* 113*  BUN 91*  --    < > 68* 69* 74*  CREATININE 9.77*  --    < > 8.53* 8.43* 8.89*  CALCIUM 7.6*  --    < > 6.9* 6.9* 7.1*  PHOS 6.5* 7.0*  --   --   --   --    < > = values in this interval not displayed.   Liver Function Tests: Recent Labs  Lab 08/06/20 2012 08/09/20 2114  AST 20 11*  ALT 39 12  ALKPHOS 113 99  BILITOT 0.5 0.5  PROT 6.0* 5.4*  ALBUMIN 2.3* 2.1*   Recent Labs  Lab 08/06/20 2012  LIPASE 23   CBC: Recent Labs  Lab 08/07/20 0258 08/08/20 0534 08/08/20 2150 08/09/20 0228 08/09/20 2342 08/11/20 0449  WBC 6.5 5.3  --  6.8 6.2 6.5  HGB 7.8* 6.8*   < > 7.3* 7.3* 7.8*  HCT 25.9* 22.4*   < > 22.6* 22.5* 24.4*  MCV 86.3 85.5  --  83.4 82.7 83.3  PLT 336 311  --  305 296 306   < > = values in this interval not displayed.   Blood Culture    Component Value Date/Time   SDES ABSCESS FINGER LEFT 07/16/2014 2000   SDES ABSCESS FINGER  LEFT 07/16/2014 2000   SPECREQUEST NONE 07/16/2014 2000   SPECREQUEST NONE 07/16/2014 2000   CULT  07/16/2014 2000    NO ANAEROBES ISOLATED Performed at [facility]  07/16/2014 2000    MODERATE STREPTOCOCCUS GROUP F Performed at [place] 07/22/2014 FINAL 07/16/2014 2000   REPTSTATUS 07/19/2014 FINAL 07/16/2014 2000    Cardiac Enzymes: No results for input(s): CKTOTAL, CKMB, CKMBINDEX, TROPONINI in the last 168 hours. CBG: Recent Labs  Lab 08/11/20 1631 08/11/20 2053 08/12/20 0017 08/12/20 0401 08/12/20 0809  GLUCAP 92 113* 112* 114* 120*   Iron Studies:  No results for input(s): IRON, TIBC, TRANSFERRIN, FERRITIN in the last 72 hours. '@lablastinr3'$ @ Studies/Results: No results found. Medications: . sodium chloride 250 mL (08/12/20 0940)  .  ferric gluconate (FERRLECIT/NULECIT) IV    . furosemide 160 mg (08/12/20 0942)   . carvedilol  6.25 mg Oral BID WC  . Chlorhexidine Gluconate Cloth  6 each Topical Q0600  . darbepoetin (ARANESP) injection - DIALYSIS  150 mcg Intravenous Q Tue-HD  . heparin  5,000 Units Subcutaneous Q8H  . insulin aspart  0-6 Units Subcutaneous Q4H  . losartan  25 mg Oral Daily  . metolazone  5 mg Oral Daily  . sevelamer carbonate  800 mg Oral TID WC  . tamsulosin  0.4 mg Oral QPC supper    Assessment/Plan **new ESRD:  Follows with Dr. Daiva Huge but no appt since 02/2020 due to missed appts.  This appears to be progressive renal decline to point of needing to initiate dialysis > ESRD.  S/p [facility] placement 08/08/20.  HD #1 1/29. VVS for perm access - saw and plan AVF 08/11/20, appreciate. Plan HD #2 2/1- today.  CLIP underway.  He is ready to go but needs financial clearance unfortunately   **volume overload: cont IV lasix and metolazone 5 daily for diuresis in between HD treatments given marked overload; UF with HD;  Strict I/Os. Restrict sodium, fluid. Weight down 6 kg-  He can tell a big difference. Will stop  high dose diuretics upon discharge  **HTN urgency:  Remains high.  Should improve with UF but in the meantime added losartan 25 daily - K has been ok and he has intolerance to hydralazine and amlodipine; cont coreg.   **Metabolic acidosis: resolved, d/c po bicarb  **Anemia:  Hb 7.3 which is recent Baseline.  Iron deficient > giving course of IV iron.  Hemoccult ordered.  Start ESA    **BMM: phos 7.0, PTH 113.  Renal diet and sevelamer 1 TIDAC.   **DM: per primary.  **RLS: replete iron  [name]  08/12/2020, 11:10 AM   [facility] Pager: [phone]

## 2020-08-12 NOTE — Progress Notes (Signed)
   ASSESSMENT & PLAN:  [name] is a 35 y.o. male status post LUE RC AVF on 08/11/20, no issues postop. Follow up with me in 6-8 weeks.  SUBJECTIVE:  No complaints. No pain in L hand. Mild incisional pain.   OBJECTIVE:  BP (!) 147/89 (BP Location: Right Arm)   Pulse 83   Temp 98 F (36.7 C) (Oral)   Resp 17   Ht 6' (1.829 m)   Wt 105.9 kg   SpO2 99%   BMI 31.66 kg/m   Intake/Output Summary (Last 24 hours) at 08/12/2020 0737 Last data filed at 08/11/2020 1846 Gross per 24 hour  Intake 370.28 ml  Output 10 ml  Net 360.28 ml    Constitutional: well appearing. no acute distress. Cardiac: RRR. Vascular: LUE dressing clean and dry. Hand warm and well perfused. Palpable thrill in AVF.  CBC Latest Ref Rng & Units 08/11/2020 08/09/2020 08/09/2020  WBC 4.0 - 10.5 K/uL 6.5 6.2 6.8  Hemoglobin 13.0 - 17.0 g/dL 7.8(L) 7.3(L) 7.3(L)  Hematocrit 39.0 - 52.0 % 24.4(L) 22.5(L) 22.6(L)  Platelets 150 - 400 K/uL 306 296 305     CMP Latest Ref Rng & Units 08/11/2020 08/09/2020 08/09/2020  Glucose 70 - 99 mg/dL 113(H) 120(H) 121(H)  BUN 6 - 20 mg/dL 74(H) 69(H) 68(H)  Creatinine 0.61 - 1.24 mg/dL 8.89(H) 8.43(H) 8.53(H)  Sodium 135 - 145 mmol/L 136 139 137  Potassium 3.5 - 5.1 mmol/L 4.2 4.5 4.5  Chloride 98 - 111 mmol/L 102 106 103  CO2 22 - 32 mmol/L '23 22 22  '$ Calcium 8.9 - 10.3 mg/dL 7.1(L) 6.9(L) 6.9(L)  Total Protein 6.5 - 8.1 g/dL - - 5.4(L)  Total Bilirubin 0.3 - 1.2 mg/dL - - 0.5  Alkaline Phos 38 - 126 U/L - - 99  AST 15 - 41 U/L - - 11(L)  ALT 0 - 44 U/L - - 12    Estimated Creatinine Clearance: 14.6 mL/min (A) (by C-G formula based on SCr of 8.89 mg/dL (H)).  [name]. Stanford Breed, MD [facility] Phone Number: [phone] 08/12/2020 7:37 AM

## 2020-08-12 NOTE — Progress Notes (Signed)
Renal Navigator awaiting financial clearance, since patient is uninsured, and seat schedule for outpatient HD. Navigator updated patient's bedside RN and will attempt to reach patient again once seat is confirmed and patient is safe for discharge.   Alphonzo Cruise, [place] [facility]igator [phone]

## 2020-08-13 ENCOUNTER — Other Ambulatory Visit (HOSPITAL_COMMUNITY): Payer: Self-pay | Admitting: Internal Medicine

## 2020-08-13 LAB — GLUCOSE, CAPILLARY
Glucose-Capillary: 104 mg/dL — ABNORMAL HIGH (ref 70–99)
Glucose-Capillary: 94 mg/dL (ref 70–99)

## 2020-08-13 MED ORDER — CARVEDILOL 6.25 MG PO TABS: 6.2500 mg | ORAL_TABLET | Freq: Two times a day (BID) | ORAL | 1 refills | Status: DC

## 2020-08-13 MED ORDER — LOSARTAN POTASSIUM 50 MG PO TABS
50.0000 mg | ORAL_TABLET | Freq: Every day | ORAL | Status: DC
  Administered 2020-08-13: 50 mg via ORAL
  Filled 2020-08-13: qty 1

## 2020-08-13 MED ORDER — TORSEMIDE 20 MG PO TABS: ORAL_TABLET | ORAL | 1 refills | Status: DC

## 2020-08-13 MED ORDER — TORSEMIDE 20 MG PO TABS: 40.0000 mg | ORAL_TABLET | Freq: Every day | ORAL | 1 refills | Status: DC

## 2020-08-13 MED ORDER — SEVELAMER CARBONATE 800 MG PO TABS: 800.0000 mg | ORAL_TABLET | Freq: Three times a day (TID) | ORAL | 0 refills | Status: DC

## 2020-08-13 MED ORDER — LOSARTAN POTASSIUM 50 MG PO TABS: 50.0000 mg | ORAL_TABLET | Freq: Every day | ORAL | 0 refills | Status: DC

## 2020-08-13 MED ORDER — METOLAZONE 5 MG PO TABS: 5.0000 mg | ORAL_TABLET | ORAL | 1 refills | Status: DC

## 2020-08-13 MED FILL — TORSEMIDE 20 MG TABLET: 20 | 30 days supply | Qty: 48 | Fill #0

## 2020-08-13 MED FILL — LOSARTAN POTASSIUM 50 MG TA: 50 | 30 days supply | Qty: 30 | Fill #0

## 2020-08-13 MED FILL — SEVELAMER CARBONATE 800 MG: 800 | 30 days supply | Qty: 90 | Fill #0

## 2020-08-13 MED FILL — CARVEDILOL 6.25 MG TABLET: 6.25 | 30 days supply | Qty: 60 | Fill #0

## 2020-08-13 NOTE — Discharge Summary (Signed)
Physician Discharge Summary  [name] [id number] DOB: 1985/12/24 DOA: 08/06/2020  PCP: Patient, No Pcp Per  Admit date: 08/06/2020 Discharge date: 08/13/2020  Admitted From:home Disposition: home  Recommendations for Outpatient Follow-up:  1. Follow up with PCP in 1-2 weeks 2. Please cont HD MWF as scheduled 3. Please follow up on the following pending results:  Home Health:no  Equipment/Devices: none  Discharge Condition: Stable Code Status:   Code Status: Full Code Diet recommendation:  Diet Order            Diet - low sodium heart healthy           Diet renal with fluid restriction Fluid restriction: 1200 mL Fluid; Room service appropriate? 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  Hyperkalemia 5.2 metabolic acidosis BUN 89 creatinine 9.9, anemia COVID-19 negative BNP elevated 2067. Patient is admitted and treated for new ESRD, HD started on 1/29 with [facility], aVF placed 1/31 by vascular. Having issue with hypertension blood pressure regimen being adjusted by nephrology.  At this time patient waiting for outpatient dialysis set up for discharge home.  Discharge Diagnoses:   New ESRD: Status post HD first session 1/29 with [facility], status post aVF 1/31st, s/p  HD #2 on 2/2.  Patient waiting for outpatient HD set up-CLIP underway social worker following.   Volume overload due to ESRD: Weight improving, leg edema much better, he will continue oral torsemide and with dialysis Monday Wednesday Friday.   Net IO Since Admission: -5,395.29 mL [08/13/20  0834]  Filed Weights   08/12/20 0404 08/12/20 1145 08/13/20 0500  Weight: 105.9 kg 106.9 kg 104.8 kg   Hypertension urgency in the setting of fluid overload and missed meds.  Blood pressure remains poorly controlled- On Coreg 6.25 mg bid, losartan new 25 mg, I will increase to 50 mg 08/13/20.  Continue diuresis, dialysis and torsemide.He is unable to tolerate amlodipine and hydralazine.  Urine retention;Voiding well.  Metabolic acidosis/uremic symptoms in the setting of #1 resolved.  Off oral bicarb.  Anemia likely from chronic renal disease low serum iron, on ESA and s/p IV iron as per nephrology.  H&H dropped to 6.8, s/p 1 unit PRBC 1/28 H&H overall stable.  Monitor and transfuse for less than 7 g.   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  Morbid obesity BMI 31.he will benefit with weight loss and outpatient follow-up.  Restless leg syndrome replete iron   Body mass index is 31.33 kg/m.  Consults:  Nephrology  Subjective: Alert awake oriented this morning aware about dialysis scheduled for Friday and agrees for discharge home today. Has no new complaints.  Discharge Exam:  Vitals:   08/13/20 0930 08/13/20 1300  BP: (!) 161/84 (!) 152/90  Pulse: 81 80  Resp: 18 17  Temp:  98.8 F (37.1 C)  SpO2:  99%   General: Pt is alert, awake, not in acute distress Cardiovascular: RRR, S1/S2 +, no rubs, no gallops Respiratory: CTA bilaterally, no wheezing, no rhonchi Abdominal: Soft, NT, ND, bowel sounds + Extremities: no edema, no  cyanosis  Discharge Instructions  Discharge Instructions    Diet - low sodium heart healthy   Complete by: As directed    Renal diet   Discharge instructions   Complete by: As directed    Cont on HD as per schedule Cont torsemide  Please call call MD or return to ER for similar or worsening recurring problem that brought you to hospital or if any fever,nausea/vomiting,abdominal pain, uncontrolled pain, chest pain,  shortness of breath or any other alarming symptoms.  Please follow-up your doctor as instructed in a week time and call the office for appointment.  Please avoid alcohol, smoking, or any other illicit substance and maintain healthy habits including taking your regular medications as prescribed.  You were cared for by a hospitalist during your hospital stay. If you have any questions about your discharge medications or the care you received while you were in the hospital after you are discharged, you can call the unit and ask to speak with the hospitalist on call if the hospitalist that took care of you is not available.  Once you are discharged, your primary care physician will handle any further medical issues. Please note that NO REFILLS for any discharge medications will be authorized once you are discharged, as it is imperative that you return to your primary care physician (or establish a relationship with a primary care physician if you do not have one) for your aftercare needs so that they can reassess your need for medications and monitor your lab values   Discharge wound care:   Complete by: As directed    Reinforce dressing   Increase activity slowly   Complete by: As directed      Allergies as of 08/13/2020      Reactions   Amlodipine Nausea And Vomiting, Other (See Comments)   Patient was taking Amlodipine and Hydralazine at the same time, so the reactions came from one of the 2: Lethargy and an all-over feeling of NOT feeling well (also)   Hydralazine Nausea And  Vomiting, Other (See Comments)   Patient was taking Hydralazine AND Amlodipine at the same time, so the reactions came from one of the 2: Lethargy and an all-over feeling of NOT feeling well (also)      Medication List    TAKE these medications   carvedilol 6.25 MG tablet Commonly known as: COREG Take 1 tablet (6.25 mg total) by mouth 2 (two) times daily with a meal.   insulin detemir 100 UNIT/ML injection Commonly known as: LEVEMIR Inject 0.15 mLs (15 Units total) into the skin daily. What changed: how much to take   losartan 50 MG tablet Commonly known as: COZAAR Take 1 tablet (50 mg total) by mouth daily. Start taking on: August 14, 2020   sevelamer carbonate 800 MG tablet Commonly known as: RENVELA Take 1 tablet (800 mg total) by mouth 3 (three) times daily with meals.   torsemide 20 MG tablet Commonly known as: Demadex Take 2 tablets (40 mg) daily on nonHD days            Discharge  Care Instructions  (From admission, onward)         Start     Ordered   08/13/20 0000  Discharge wound care:       Comments: Reinforce dressing   08/13/20 1130          Follow-up Information    [facility] Follow up on 09/03/2020.   Why:  Post hospital follow up scheduled for 09/03/2020 at 9:30 am with Geryl Rankins. Please screen patient for card  Contact information: [address] [place] [phone]       [facility] In 6 weeks.   Specialty: Vascular Surgery Why: Office will call you to arrange your appt (sent) Contact information: [address] [place], [place] Follow up on 08/15/2020.   Why: Your outpatient dialysis schedule is MWF at 10:55am. Please go to your clinic on Friday 2/4 at 10:00 am to sign papers and have your first outpatient treatment. Bring ID and SS card. You may want to bring a blanket and pillow.   Contact information: [place] [address] [phone]              Allergies  Allergen Reactions  . Amlodipine Nausea And Vomiting and Other (See Comments)    Patient was taking Amlodipine and Hydralazine at the same time, so the reactions came from one of the 2: Lethargy and an all-over feeling of NOT feeling well (also)  . Hydralazine Nausea And Vomiting and Other (See Comments)    Patient was taking Hydralazine AND Amlodipine at the same time, so the reactions came from one of the 2: Lethargy and an all-over feeling of NOT feeling well (also)    The results of significant diagnostics from this hospitalization (including imaging, microbiology, ancillary and laboratory) are listed below for reference.    Microbiology: Recent Results (from the past 240 hour(s))  SARS Coronavirus 2 by RT PCR (hospital order, performed in [facility] hospital lab) Nasopharyngeal Nasopharyngeal Swab     Status: None   Collection Time: 08/06/20  8:08 PM   Specimen: Nasopharyngeal Swab  Result Value Ref Range Status   SARS Coronavirus 2 NEGATIVE NEGATIVE Final    Comment: (NOTE) SARS-CoV-2 target nucleic acids are NOT DETECTED.  The SARS-CoV-2 RNA is generally detectable in upper and lower respiratory specimens during the acute phase of infection. The lowest concentration of SARS-CoV-2 viral copies this assay can detect is 250 copies / mL. A negative result does not preclude SARS-CoV-2 infection and should not be used as the sole basis for treatment or other patient management decisions.  A negative result may occur with improper specimen collection / handling, submission of specimen other than nasopharyngeal swab, presence of viral mutation(s) within the areas targeted by this assay, and inadequate number of viral copies (<250 copies / mL). A negative result must be combined with clinical observations, patient history, and epidemiological information.  Fact Sheet for Patients:    StrictlyIdeas.no  Fact Sheet for Healthcare Providers: BankingDealers.co.za  This test is not yet approved or  cleared by the [place] FDA and has been authorized for detection and/or diagnosis of SARS-CoV-2 by FDA under an Emergency Use Authorization (EUA).  This EUA will remain in effect (meaning this test can be used) for the duration of the COVID-19 declaration under Section 564(b)(1) of the Act, 21 U.S.C. section 360bbb-3(b)(1), unless the authorization is terminated  or revoked sooner.  Performed at [facility], [place] [address] [place], [place] [address]   MRSA PCR Screening     Status: None   Collection Time: 08/11/20  5:15 AM   Specimen: Nasopharyngeal  Result Value Ref Range Status   MRSA by PCR NEGATIVE NEGATIVE Final    Comment:        The GeneXpert MRSA Assay (FDA approved for NASAL specimens only), is one component of a comprehensive MRSA colonization surveillance program. It is not intended to diagnose MRSA infection nor to guide or monitor treatment for MRSA infections. Performed at [facility], [place] [address], [place], [place] [address]     Procedures/Studies: DG Chest 2 View  Result Date: 08/07/2020 CLINICAL DATA:  Leg swelling EXAM: CHEST - 2 VIEW COMPARISON:  March 10, 2020 FINDINGS: Again noted is significant cardiomegaly which appears to have increased from prior study. The bronchi are splayed as before. Again noted is vascular congestion with developing interstitial edema. There are small bilateral pleural effusions. No pneumothorax. IMPRESSION: 1. Worsening cardiomegaly. An underlying pericardial effusion is not excluded. 2. Vascular congestion with interstitial edema and small bilateral pleural effusions. Electronically Signed   By: Constance Holster M.D.   On: 08/07/2020 01:05   IR US Guide Vasc Access Right  Result Date: 08/07/2020 CLINICAL DATA:  Renal failure and need for tunneled  hemodialysis catheter to begin hemodialysis treatment. EXAM: TUNNELED CENTRAL VENOUS HEMODIALYSIS CATHETER PLACEMENT WITH ULTRASOUND AND FLUOROSCOPIC GUIDANCE ANESTHESIA/SEDATION: 2.0 mg IV Versed; 50 mcg IV Fentanyl. Total Moderate Sedation Time:   28 minutes. The patient's level of consciousness and physiologic status were continuously monitored during the procedure by Radiology nursing. MEDICATIONS: 2 g IV Ancef. FLUOROSCOPY TIME:  36 seconds. 6.1 mGy. PROCEDURE: The procedure, risks, benefits, and alternatives were explained to the patient. Questions regarding the procedure were encouraged and answered. The patient understands and consents to the procedure. A timeout was performed prior to initiating the procedure. Ultrasound was utilized to confirm patency of the right internal jugular vein. The right neck and chest were prepped with chlorhexidine in a sterile fashion, and a sterile drape was applied covering the operative field. Maximum barrier sterile technique with sterile gowns and gloves were used for the procedure. Local anesthesia was provided with 1% lidocaine. After creating a small venotomy incision, a 21 gauge needle was advanced into the right internal jugular vein under direct, real-time ultrasound guidance. Ultrasound image documentation was performed. After securing guidewire access, an 8 Fr dilator was placed. A J-wire was kinked to measure appropriate catheter length. A Palindrome tunneled hemodialysis catheter measuring 23 cm from tip to cuff was chosen for placement. This was tunneled in a retrograde fashion from the chest wall to the venotomy incision. At the venotomy, serial dilatation was performed and a 15 Fr peel-away sheath was placed over a guidewire. The catheter was then placed through the sheath and the sheath removed. Final catheter positioning was confirmed and documented with a fluoroscopic spot image. The catheter was aspirated, flushed with saline, and injected with appropriate  volume heparin dwells. The venotomy incision was closed with subcuticular 4-0 Vicryl. Dermabond was applied to the incision. The catheter exit site was secured with 0-Prolene retention sutures. COMPLICATIONS: None.  No pneumothorax. FINDINGS: After catheter placement, the tip lies in the right atrium. The catheter aspirates normally and is ready for immediate use. IMPRESSION: Placement of tunneled hemodialysis catheter via the right internal jugular vein. The catheter tip lies in the right  atrium. The catheter is ready for immediate use. Electronically Signed   By: Aletta Edouard M.D.   On: 08/07/2020 17:01   DG CHEST PORT 1 VIEW  Result Date: 08/09/2020 CLINICAL DATA:  Chest pain in. EXAM: PORTABLE CHEST 1 VIEW COMPARISON:  Radiograph 2 days ago 08/07/2020 FINDINGS: Interval placement of right-sided dialysis catheter with tip overlying the atrial caval junction. Unchanged cardiomegaly. Increase fluid in the fissures. Small pleural effusions. Mild vascular congestion. No pneumothorax or confluent airspace disease. No acute osseous abnormalities are seen. IMPRESSION: 1. Interval placement of right-sided dialysis catheter with tip overlying the atrial caval junction. 2. Unchanged cardiomegaly. Vascular congestion. Increasing fluid in the fissures. Small pleural effusions are grossly stable. Electronically Signed   By: Keith Rake M.D.   On: 08/09/2020 21:10   VAS [place] UPPER EXT VEIN MAPPING (PRE-OP AVF)  Result Date: 08/08/2020 UPPER EXTREMITY VEIN MAPPING  Indications: Pre-access. Comparison Study: No previous Performing Technologist: Vonzell Schlatter AVF  Examination Guidelines: A complete evaluation includes B-mode imaging, spectral Doppler, color Doppler, and power Doppler as needed of all accessible portions of each vessel. Bilateral testing is considered an integral part of a complete examination. Limited examinations for reoccurring indications may be performed as noted.  +-----------------+-------------+----------+---------+ Right Cephalic   Diameter (cm)Depth (cm)Findings  +-----------------+-------------+----------+---------+ Shoulder             0.43        0.44   branching +-----------------+-------------+----------+---------+ Prox upper arm       0.35        0.38             +-----------------+-------------+----------+---------+ Mid upper arm        0.37        0.28             +-----------------+-------------+----------+---------+ Dist upper arm       0.37        0.23   branching +-----------------+-------------+----------+---------+ Antecubital fossa    0.70        0.23             +-----------------+-------------+----------+---------+ Prox forearm         0.41        0.27             +-----------------+-------------+----------+---------+ Mid forearm          0.32        0.23             +-----------------+-------------+----------+---------+ Dist forearm         0.33        0.26   branching +-----------------+-------------+----------+---------+ Wrist                0.33        0.21   branching +-----------------+-------------+----------+---------+ +-----------------+-------------+----------+---------+ Left Cephalic    Diameter (cm)Depth (cm)Findings  +-----------------+-------------+----------+---------+ Shoulder             0.40        0.57             +-----------------+-------------+----------+---------+ Prox upper arm       0.32        0.27             +-----------------+-------------+----------+---------+ Mid upper arm        0.26        0.29   branching +-----------------+-------------+----------+---------+ Dist upper arm       0.27        0.24             +-----------------+-------------+----------+---------+  Antecubital fossa    0.30        0.18             +-----------------+-------------+----------+---------+ Prox forearm         0.30        0.18              +-----------------+-------------+----------+---------+ Mid forearm          0.33        0.22             +-----------------+-------------+----------+---------+ Dist forearm         0.23        0.23   branching +-----------------+-------------+----------+---------+ Wrist                0.33        0.30             +-----------------+-------------+----------+---------+ *See table(s) above for measurements and observations.  Diagnosing physician: Jamelle Haring Electronically signed by Jamelle Haring on 08/08/2020 at 5:15:08 PM.    Final    IR TUNNELED CENTRAL VENOUS CATHETER PLACEMENT  Result Date: 08/07/2020 CLINICAL DATA:  Renal failure and need for tunneled hemodialysis catheter to begin hemodialysis treatment. EXAM: TUNNELED CENTRAL VENOUS HEMODIALYSIS CATHETER PLACEMENT WITH ULTRASOUND AND FLUOROSCOPIC GUIDANCE ANESTHESIA/SEDATION: 2.0 mg IV Versed; 50 mcg IV Fentanyl. Total Moderate Sedation Time:   28 minutes. The patient's level of consciousness and physiologic status were continuously monitored during the procedure by Radiology nursing. MEDICATIONS: 2 g IV Ancef. FLUOROSCOPY TIME:  36 seconds. 6.1 mGy. PROCEDURE: The procedure, risks, benefits, and alternatives were explained to the patient. Questions regarding the procedure were encouraged and answered. The patient understands and consents to the procedure. A timeout was performed prior to initiating the procedure. Ultrasound was utilized to confirm patency of the right internal jugular vein. The right neck and chest were prepped with chlorhexidine in a sterile fashion, and a sterile drape was applied covering the operative field. Maximum barrier sterile technique with sterile gowns and gloves were used for the procedure. Local anesthesia was provided with 1% lidocaine. After creating a small venotomy incision, a 21 gauge needle was advanced into the right internal jugular vein under direct, real-time ultrasound guidance. Ultrasound image  documentation was performed. After securing guidewire access, an 8 Fr dilator was placed. A J-wire was kinked to measure appropriate catheter length. A Palindrome tunneled hemodialysis catheter measuring 23 cm from tip to cuff was chosen for placement. This was tunneled in a retrograde fashion from the chest wall to the venotomy incision. At the venotomy, serial dilatation was performed and a 15 Fr peel-away sheath was placed over a guidewire. The catheter was then placed through the sheath and the sheath removed. Final catheter positioning was confirmed and documented with a fluoroscopic spot image. The catheter was aspirated, flushed with saline, and injected with appropriate volume heparin dwells. The venotomy incision was closed with subcuticular 4-0 Vicryl. Dermabond was applied to the incision. The catheter exit site was secured with 0-Prolene retention sutures. COMPLICATIONS: None.  No pneumothorax. FINDINGS: After catheter placement, the tip lies in the right atrium. The catheter aspirates normally and is ready for immediate use. IMPRESSION: Placement of tunneled hemodialysis catheter via the right internal jugular vein. The catheter tip lies in the right atrium. The catheter is ready for immediate use. Electronically Signed   By: Aletta Edouard M.D.   On: 08/07/2020 17:01    Labs: BNP (last 3 results) Recent Labs    02/25/20  1127 03/10/20 1410 08/06/20 2012  BNP 694.9* 617.0* 123XX123*   Basic Metabolic Panel: Recent Labs  Lab 08/07/20 0258 08/07/20 1806 08/08/20 0534 08/09/20 0228 08/09/20 2114 08/09/20 2342 08/11/20 0449  NA 136  --  139 139 137 139 136  K 4.9  --  5.1 4.8 4.5 4.5 4.2  CL 107  --  109 109 103 106 102  CO2 15*  --  18* 18* '22 22 23  '$ GLUCOSE 151*  --  106* 114* 121* 120* 113*  BUN 91*  --  91* 89* 68* 69* 74*  CREATININE 9.77*  --  10.32* 10.18* 8.53* 8.43* 8.89*  CALCIUM 7.6*  --  7.3*  7.2* 7.1* 6.9* 6.9* 7.1*  MG 1.8  --   --   --   --   --   --   PHOS  6.5* 7.0*  --   --   --   --   --    Liver Function Tests: Recent Labs  Lab 08/06/20 2012 08/09/20 2114  AST 20 11*  ALT 39 12  ALKPHOS 113 99  BILITOT 0.5 0.5  PROT 6.0* 5.4*  ALBUMIN 2.3* 2.1*   Recent Labs  Lab 08/06/20 2012  LIPASE 23   No results for input(s): AMMONIA in the last 168 hours. CBC: Recent Labs  Lab 08/07/20 0258 08/08/20 0534 08/08/20 2150 08/09/20 0228 08/09/20 2342 08/11/20 0449  WBC 6.5 5.3  --  6.8 6.2 6.5  HGB 7.8* 6.8* 7.5* 7.3* 7.3* 7.8*  HCT 25.9* 22.4* 24.1* 22.6* 22.5* 24.4*  MCV 86.3 85.5  --  83.4 82.7 83.3  PLT 336 311  --  305 296 306   Cardiac Enzymes: No results for input(s): CKTOTAL, CKMB, CKMBINDEX, TROPONINI in the last 168 hours. BNP: Invalid input(s): POCBNP CBG: Recent Labs  Lab 08/12/20 1109 08/12/20 1617 08/12/20 2013 08/13/20 0735 08/13/20 1123  GLUCAP 127* 122* 133* 104* 94   D-Dimer No results for input(s): DDIMER in the last 72 hours. Hgb A1c No results for input(s): HGBA1C in the last 72 hours. Lipid Profile No results for input(s): CHOL, HDL, LDLCALC, TRIG, CHOLHDL, LDLDIRECT in the last 72 hours. Thyroid function studies No results for input(s): TSH, T4TOTAL, T3FREE, THYROIDAB in the last 72 hours.  Invalid input(s): FREET3 Anemia work up No results for input(s): VITAMINB12, FOLATE, FERRITIN, TIBC, IRON, RETICCTPCT in the last 72 hours. Urinalysis    Component Value Date/Time   COLORURINE YELLOW 08/07/2020 0202   APPEARANCEUR CLOUDY (A) 08/07/2020 0202   LABSPEC 1.012 08/07/2020 0202   PHURINE 5.0 08/07/2020 0202   GLUCOSEU NEGATIVE 08/07/2020 0202   HGBUR MODERATE (A) 08/07/2020 0202   BILIRUBINUR NEGATIVE 08/07/2020 0202   KETONESUR NEGATIVE 08/07/2020 0202   PROTEINUR >=300 (A) 08/07/2020 0202   UROBILINOGEN 0.2 07/16/2014 1747   NITRITE NEGATIVE 08/07/2020 0202   LEUKOCYTESUR NEGATIVE 08/07/2020 0202   Sepsis Labs Invalid input(s): PROCALCITONIN,  WBC,  LACTICIDVEN Microbiology Recent  Results (from the past 240 hour(s))  SARS Coronavirus 2 by RT PCR (hospital order, performed in [place] hospital lab) Nasopharyngeal Nasopharyngeal Swab     Status: None   Collection Time: 08/06/20  8:08 PM   Specimen: Nasopharyngeal Swab  Result Value Ref Range Status   SARS Coronavirus 2 NEGATIVE NEGATIVE Final    Comment: (NOTE) SARS-CoV-2 target nucleic acids are NOT DETECTED.  The SARS-CoV-2 RNA is generally detectable in upper and lower respiratory specimens during the acute phase of infection. The lowest concentration of SARS-CoV-2 viral  copies this assay can detect is 250 copies / mL. A negative result does not preclude SARS-CoV-2 infection and should not be used as the sole basis for treatment or other patient management decisions.  A negative result may occur with improper specimen collection / handling, submission of specimen other than nasopharyngeal swab, presence of viral mutation(s) within the areas targeted by this assay, and inadequate number of viral copies (<250 copies / mL). A negative result must be combined with clinical observations, patient history, and epidemiological information.  Fact Sheet for Patients:   StrictlyIdeas.no  Fact Sheet for Healthcare Providers: [email or web address]  This test is not yet approved or  cleared by the [place] FDA and has been authorized for detection and/or diagnosis of SARS-CoV-2 by FDA under an Emergency Use Authorization (EUA).  This EUA will remain in effect (meaning this test can be used) for the duration of the COVID-19 declaration under Section 564(b)(1) of the Act, 21 U.S.C. section 360bbb-3(b)(1), unless the authorization is terminated or revoked sooner.  Performed at [facility], [place] [address] [place], [place] [address]   MRSA PCR Screening     Status: None   Collection Time: 08/11/20  5:15 AM   Specimen: Nasopharyngeal  Result Value Ref Range  Status   MRSA by PCR NEGATIVE NEGATIVE Final    Comment:        The GeneXpert MRSA Assay (FDA approved for NASAL specimens only), is one component of a comprehensive MRSA colonization surveillance program. It is not intended to diagnose MRSA infection nor to guide or monitor treatment for MRSA infections. Performed at [facility], [place] [address] [place], [place] [address]      Time coordinating discharge: 25 minutes  SIGNED: Antonieta Pert, MD  [facility] 08/13/2020, 2:14 PM  If 7PM-7AM, please contact night-coverage [email or web address]

## 2020-08-13 NOTE — Progress Notes (Signed)
Patient has been accepted at [facility] clinic on a MWF schedule with a seat time of 10:55am. We are still awaiting financial clearance, but given that patient is medically ready for discharge and the hospital is at capacity, patient needs to discharge. Navigator spoke with patient, who states he has worked "my whole life" (from high school until about a week ago-patient is 35 years old-confirming that he has paid into the Social Security system all these years. This should mean that with a diagnosis of ESRD, patient should qualify for Medicare, which will cover HD. Navigator asked him to please follow up with the insurance coordinator at the outpatient clinic.  Patient needs to arrive to his first appointment, Friday, 08/15/20 at 10:00am to complete intake paperwork prior to his first treatment. Patient states he has his own transportation, but may need a ride home from the hospital because he did not drive himself here. Navigator notified CM/A. Cole. Navigator also informed patient that he can ask his clinic to assist him in applying for [facility] [place] if transportation is ever needed in the future. He was appreciative of the information. Navigator informed patient on the importance of attending all 3 HD appointments per week. Patient agrees.  No barriers to discharge. Navigator updated Attending and bedside RN.  Alphonzo Cruise, [facility] [phone]

## 2020-08-13 NOTE — Plan of Care (Signed)

## 2020-08-13 NOTE — Progress Notes (Signed)
[name] to be Discharged home per MD order.  Discussed prescriptions and follow up appointments with the patient. Prescriptions delivered to patient from our transitions of care pharmacy, medication list explained in detail. Pt verbalized understanding.  Allergies as of 08/13/2020      Reactions   Amlodipine Nausea And Vomiting, Other (See Comments)   Patient was taking Amlodipine and Hydralazine at the same time, so the reactions came from one of the 2: Lethargy and an all-over feeling of NOT feeling well (also)   Hydralazine Nausea And Vomiting, Other (See Comments)   Patient was taking Hydralazine AND Amlodipine at the same time, so the reactions came from one of the 2: Lethargy and an all-over feeling of NOT feeling well (also)      Medication List    TAKE these medications   carvedilol 6.25 MG tablet Commonly known as: COREG Take 1 tablet (6.25 mg total) by mouth 2 (two) times daily with a meal.   insulin detemir 100 UNIT/ML injection Commonly known as: LEVEMIR Inject 0.15 mLs (15 Units total) into the skin daily. What changed: how much to take   losartan 50 MG tablet Commonly known as: COZAAR Take 1 tablet (50 mg total) by mouth daily. Start taking on: August 14, 2020   sevelamer carbonate 800 MG tablet Commonly known as: RENVELA Take 1 tablet (800 mg total) by mouth 3 (three) times daily with meals.   torsemide 20 MG tablet Commonly known as: Demadex Take 2 tablets (40 mg) daily on nonHD days            Discharge Care Instructions  (From admission, onward)         Start     Ordered   08/13/20 0000  Discharge wound care:       Comments: Reinforce dressing   08/13/20 1130          Vitals:   08/13/20 0930 08/13/20 1300  BP: (!) 161/84 (!) 152/90  Pulse: 81 80  Resp: 18 17  Temp:  98.8 F (37.1 C)  SpO2:  99%    Skin clean, dry and intact without evidence of skin break down, no evidence of skin tears noted. IV catheter discontinued. Site  without signs and symptoms of complications. Dressing and pressure applied.  Perm cath to right upper chest left in place- dressing intact, dressing on left forearm fistula changed. Pt denies pain at this time. No complaints noted.  An After Visit Summary was printed and given to the patient. Patient escorted via Wheelchair, and Discharged home via private auto with brother.  [place] 08/13/2020 4:30pm

## 2020-08-13 NOTE — Progress Notes (Addendum)
[facility] Progress Note    Assessment/ Plan:   1. New ESRD: S/p TDC placement 08/08/2020.  HD #1 1/29.  aVF 08/11/2020.  HD #2 yesterday, 2/1. Patient now has scheduled outpatient HD for Friday.  -Can DC from nephrology standpoint, primary team aware  -Continue renvela on DC  - Recommend DC on '40mg'$  torsemide on non-HD days  2. Volume overload: Weight down just over 7kg since 1/27. Continues on lasix '160mg'$  BID while in house, will be torsemide as OP, urine 1,377m plus one unmeasured yesterday.   3. Anemia: Hgb 7.8 on 1/31, consistent with recent baseline. Started ESA and gave some iron here  4. HTN: BP 145-168/90-111 over last 24 hours. On losartan '50mg'$  daily, metolazone '5mg'$ , and Coreg 6.'25mg'$  BID.   5. DM per primary   6. Bones-  PTH 113- phos high- placed on renvela for OP use    Patient seen and examined, agree with above note with above modifications. No issues overnight-  Resting again with lights off during the day.  Have OP HD spot and working on expediting financial clearance so feel OK to discharge so he can start there on Friday.  Will cont to strive toward EDW-  Not there yet-  He will take diuretic torsemide 40 on non HD days-  Anemia will be treated per protocol at the kidney center -  As well as PTH-  Send out with script for renvela - will follow up at kidney center.  I also reiterated the improtance of attending these OP HD treatments   KCorliss Parish MD 08/13/2020      Subjective:   Patient with no complaints this morning.  States he is anxious to discharge.  We reiterated the importance of following up with HD as scheduled on Friday at the kidney center.  Patient stated that he plans to do so.   Objective:   BP (!) 168/111 (BP Location: Right Arm)   Pulse 80   Temp 98.7 F (37.1 C) (Oral)   Resp 18   Ht 6' (1.829 m)   Wt 104.8 kg   SpO2 96%   BMI 31.33 kg/m   Intake/Output Summary (Last 24 hours) at 08/13/2020 0859 Last data filed at  08/13/2020 [id number] per 24 hour  Intake 1471.11 ml  Output 3350 ml  Net -1878.89 ml   Weight change: 1 kg  Physical Exam: General: Alert and oriented in no apparent distress Heart: S1, S2 present, regular rate Lungs: CTA bilaterally, no wheezing Skin: Warm and dry Extremities: Trace lower limb edema  Imaging: No results found.  Labs: BMET Recent Labs  Lab 08/06/20 2012 08/07/20 0258 08/07/20 1806 08/08/20 0534 08/09/20 0228 08/09/20 2114 08/09/20 2342 08/11/20 0449  NA 138 136  --  139 139 137 139 136  K 5.2* 4.9  --  5.1 4.8 4.5 4.5 4.2  CL 108 107  --  109 109 103 106 102  CO2 18* 15*  --  18* 18* '22 22 23  '$ GLUCOSE 109* 151*  --  106* 114* 121* 120* 113*  BUN 89* 91*  --  91* 89* 68* 69* 74*  CREATININE 9.90* 9.77*  --  10.32* 10.18* 8.53* 8.43* 8.89*  CALCIUM 7.4* 7.6*  --  7.3*  7.2* 7.1* 6.9* 6.9* 7.1*  PHOS  --  6.5* 7.0*  --   --   --   --   --    CBC Recent Labs  Lab 08/08/20 0534 08/08/20 2150 08/09/20 0228 08/09/20  2342 08/11/20 0449  WBC 5.3  --  6.8 6.2 6.5  HGB 6.8* 7.5* 7.3* 7.3* 7.8*  HCT 22.4* 24.1* 22.6* 22.5* 24.4*  MCV 85.5  --  83.4 82.7 83.3  PLT 311  --  305 296 306    Medications:    . carvedilol  6.25 mg Oral BID WC  . Chlorhexidine Gluconate Cloth  6 each Topical Q0600  . darbepoetin (ARANESP) injection - DIALYSIS  150 mcg Intravenous Q Tue-HD  . heparin  5,000 Units Subcutaneous Q8H  . insulin aspart  0-6 Units Subcutaneous TID AC & HS  . losartan  50 mg Oral Daily  . metolazone  5 mg Oral Daily  . sevelamer carbonate  800 mg Oral TID WC  . tamsulosin  0.4 mg Oral QPC supper     Lurline Del, DO [facility] Family Medicine Resident, PGY-2 08/13/2020, 8:59 AM

## 2020-08-13 NOTE — TOC Transition Note (Signed)
Transition of Care [facility]) - CM/SW Discharge Note   Patient Details  Name: [name] MRN: [id number] Date of Birth: 1986/02/20  [facility] CM/SW Contact:  Sharin Mons, RN Phone Number: 08/13/2020, 12:32 PM   Clinical Narrative:   Presented with SOB, nausea and leg swelling.Hx of  hypertension, diabetes mellitus, and CKD 5. Pt from home alone. States PTA independent with ADL's, no DME usage. Supportive family ( grandmother, brother). Pt without insurance. Jobless. Limited income. Referral made with Shanon Rosser ( Financial Navigator Team Lead) , voice message left, for Medicaid screening. Pt without PCP. NCM shared [address], pt interested. NCM scheduled post hospital f/u @ [facility] ( requested orange card screening to help with Rx med cost).  Hospital course: pt progressed to HD need   Per MD patient ready for DC today. Pt will d/c to home with outpatient HD session, [facility] clinic on a MWF schedule with a seat time of 10:55am. RN, patient, and family notified of DC. Pt states has transportation to home and will not have any issues getting to outpatient HD sessions.   RNCM will sign off for now as intervention is no longer needed. Please consult [place] again if new needs arise.   Final next level of care: Home/Self Care Barriers to Discharge: No Barriers Identified   Patient Goals and CMS Choice Patient states their goals for this hospitalization and ongoing recovery are:: to get better      Discharge Placement                       Discharge Plan and Services   Discharge Planning Services: CM [facility] Program,Follow-up appt [facility]                                 Social Determinants of Health (SDOH) Interventions     Readmission Risk Interventions Readmission Risk Prevention Plan 02/29/2020  Transportation Screening Complete  Home Care Screening Complete  Some recent data might be hidden

## 2020-09-03 ENCOUNTER — Other Ambulatory Visit: Payer: Self-pay

## 2020-09-03 ENCOUNTER — Encounter: Payer: Self-pay | Admitting: Nurse Practitioner

## 2020-09-03 ENCOUNTER — Ambulatory Visit: Payer: Self-pay | Attending: Nurse Practitioner | Admitting: Nurse Practitioner

## 2020-09-03 VITALS — Ht 72.0 in | Wt 218.3 lb

## 2020-09-03 DIAGNOSIS — E1122 Type 2 diabetes mellitus with diabetic chronic kidney disease: Secondary | ICD-10-CM

## 2020-09-03 DIAGNOSIS — E785 Hyperlipidemia, unspecified: Secondary | ICD-10-CM

## 2020-09-03 DIAGNOSIS — F32A Depression, unspecified: Secondary | ICD-10-CM

## 2020-09-03 DIAGNOSIS — F419 Anxiety disorder, unspecified: Secondary | ICD-10-CM

## 2020-09-03 DIAGNOSIS — N186 End stage renal disease: Secondary | ICD-10-CM

## 2020-09-03 DIAGNOSIS — Z992 Dependence on renal dialysis: Secondary | ICD-10-CM

## 2020-09-03 DIAGNOSIS — Z7689 Persons encountering health services in other specified circumstances: Secondary | ICD-10-CM

## 2020-09-03 NOTE — Progress Notes (Signed)
Virtual Visit via Telephone Note Due to national recommendations of social distancing due to [place] 19, telehealth visit is felt to be most appropriate for this patient at this time.  I discussed the limitations, risks, security and privacy concerns of performing an evaluation and management service by telephone and the availability of in person appointments. I also discussed with the patient that there may be a patient responsible charge related to this service. The patient expressed understanding and agreed to proceed.    I connected with [name] on 09/03/20  at   9:30 AM EST  EDT by telephone and verified that I am speaking with the correct person using two identifiers.   Consent I discussed the limitations, risks, security and privacy concerns of performing an evaluation and management service by telephone and the availability of in person appointments. I also discussed with the patient that there may be a patient responsible charge related to this service. The patient expressed understanding and agreed to proceed.   Location of Patient: Private Residence    Location of Provider: [place] and [employer] Office    Persons participating in Telemedicine visit: Geryl Rankins FNP-BC [place]    History of Present Illness: Telemedicine visit for: Establish Care  has a past medical history of Diabetes mellitus, ESRD on hemodialysis ([place]), High cholesterol, and Hypertension.   Currently goes to HD on M-W-F   DM2 Well controlled diabetes although LDL not at goal. Will start atorvastatin 40 mg today. He is currently not taking any diabetic medications. Does not monitor his blood glucose levels.  Lab Results  Component Value Date   HGBA1C 6.0 (H) 08/07/2020   Lab Results  Component Value Date   LDLCALC 187 (H) 12/02/2019   Essential Hypertension Not well controlled. He is currently taking carvedilol 6.25 mg BID and losartan 50 mg daily as  prescribed. Denies chest pain, shortness of breath, palpitations, lightheadedness, dizziness, headaches or BLE edema.  BP Readings from Last 3 Encounters:  08/13/20 (!) 152/90  05/10/20 (!) 163/98  03/10/20 (!) 175/94   Depression Will start tofranil 25 mg at bedtime for depression and anxiety. He currently denies any thoughts of self harm.  Depression screen [facility] 2/9 09/03/2020  Decreased Interest 1  Down, Depressed, Hopeless 3  PHQ - 2 Score 4  Altered sleeping 3  Tired, decreased energy 3  Change in appetite 2  Feeling bad or failure about yourself  3  Trouble concentrating 3  Moving slowly or fidgety/restless 0  Suicidal thoughts 0  PHQ-9 Score 18   GAD 7 : Generalized Anxiety Score 09/03/2020  Nervous, Anxious, on Edge 3  Control/stop worrying 3  Worry too much - different things 3  Trouble relaxing 2  Restless 2  Easily annoyed or irritable 1  Afraid - awful might happen 0  Total GAD 7 Score 14      Past Medical History:  Diagnosis Date  . Diabetes mellitus   . ESRD on hemodialysis ([place])   . High cholesterol   . Hypertension     Past Surgical History:  Procedure Laterality Date  . AV FISTULA PLACEMENT Left 08/11/2020   Procedure: LEFT UPPER EXTREMITY ARTERIOVENOUS (AV) FISTULA CREATION;  Surgeon: Cherre Robins, MD;  Location: [facility];  Service: Vascular;  Laterality: Left;  . FRACTURE SURGERY    . I & D EXTREMITY Left 07/16/2014   Procedure: IRRIGATION AND DEBRIDEMENT EXTREMITY/LEFT INDEX FINGER;  Surgeon: Leanora Cover, MD;  Location[facility]  OR;  Service: Orthopedics;  Laterality: Left;  . IR PERC TUN PERIT CATH WO PORT S&I Dartha [facility]  08/07/2020  . IR US GUIDE VASC ACCESS RIGHT  08/07/2020    Family History  Problem Relation Age of Onset  . Cancer Mother   . Stroke Father   . Diabetes Father   . Hypertension Father     Social History   Socioeconomic History  . Marital status: Single    Spouse name: Not on file  . Number of children: Not on file  . Years of  education: Not on file  . Highest education level: Not on file  Occupational History  . Not on file  Tobacco Use  . Smoking status: Never Smoker  . Smokeless tobacco: Never Used  Vaping Use  . Vaping Use: Never used  Substance and Sexual Activity  . Alcohol use: Not Currently  . Drug use: No  . Sexual activity: Yes  Other Topics Concern  . Not on file  Social History Narrative  . Not on file   Social Determinants of Health   Financial Resource Strain: Not on file  Food Insecurity: Not on file  Transportation Needs: Not on file  Physical Activity: Not on file  Stress: Not on file  Social Connections: Not on file     Observations/Objective: Awake, alert and oriented x 3   Review of Systems  Constitutional: Negative for fever, malaise/fatigue and weight loss.  HENT: Negative.  Negative for nosebleeds.   Eyes: Negative.  Negative for blurred vision, double vision and photophobia.  Respiratory: Negative.  Negative for cough and shortness of breath.   Cardiovascular: Negative.  Negative for chest pain, palpitations and leg swelling.  Gastrointestinal: Negative.  Negative for heartburn, nausea and vomiting.  Musculoskeletal: Negative.  Negative for myalgias.  Neurological: Negative.  Negative for dizziness, focal weakness, seizures and headaches.  Psychiatric/Behavioral: Positive for depression. Negative for suicidal ideas. The patient is nervous/anxious.     Assessment and Plan: [name] was seen today for establish care.  Diagnoses and all orders for this visit:  Encounter to establish care  Essential Hypertension Continue all antihypertensives as prescribed.  Remember to bring in your blood pressure log with you for your follow up appointment.  DASH/Mediterranean Diets are healthier choices for HTN.     Dyslipidemia, goal LDL below 70 -     atorvastatin (LIPITOR) 40 MG tablet; Take 1 tablet (40 mg total) by mouth daily. INSTRUCTIONS: Work on a low fat, heart  healthy diet and participate in regular aerobic exercise program by working out at least 150 minutes per week; 5 days a week-30 minutes per day. Avoid red meat/beef/steak,  fried foods. junk foods, sodas, sugary drinks, unhealthy snacking, alcohol and smoking.  Drink at least 80 oz of water per day and monitor your carbohydrate intake daily.    Anxiety and depression -     imipramine (TOFRANIL) 25 MG tablet; Take 1 tablet (25 mg total) by mouth at bedtime. FOR DEPRESSION  Type 2 diabetes mellitus with chronic kidney disease on chronic dialysis, without long-term current use of insulin (HCC) -     Blood Glucose Monitoring Suppl (TRUE METRIX METER) w/Device KIT; Use as instructed. Check blood glucose level by fingerstick once per day. -     glucose blood (TRUE METRIX BLOOD GLUCOSE TEST) test strip; Use as instructed. Check blood glucose level by fingerstick once per day. -     TRUEplus Lancets 28G MISC; Use as instructed. Check blood glucose level  by fingerstick once per day. Continue blood sugar control as discussed in office today, low carbohydrate diet, and regular physical exercise as tolerated, 150 minutes per week (30 min each day, 5 days per week, or 50 min 3 days per week). Keep blood sugar logs with fasting goal of 90-130 mg/dl, post prandial (after you eat) less than 180.  For Hypoglycemia: BS <60 and Hyperglycemia BS >400; contact the clinic ASAP. Annual eye exams and foot exams are recommended.     Follow Up Instructions Return in about 3 months (around 12/01/2020).     I discussed the assessment and treatment plan with the patient. The patient was provided an opportunity to ask questions and all were answered. The patient agreed with the plan and demonstrated an understanding of the instructions.   The patient was advised to call back or seek an in-person evaluation if the symptoms worsen or if the condition fails to improve as anticipated.  I provided 14 minutes of non-face-to-face  time during this encounter including median intraservice time, reviewing previous notes, labs, imaging, medications and explaining diagnosis and management.  Gildardo Pounds, FNP-BC

## 2020-09-06 ENCOUNTER — Other Ambulatory Visit: Payer: Self-pay | Admitting: Nurse Practitioner

## 2020-09-06 ENCOUNTER — Encounter: Payer: Self-pay | Admitting: Nurse Practitioner

## 2020-09-06 MED ORDER — ATORVASTATIN CALCIUM 40 MG PO TABS: 40.0000 mg | ORAL_TABLET | Freq: Every day | ORAL | 3 refills | Status: DC

## 2020-09-06 MED ORDER — TRUE METRIX METER W/DEVICE KIT: PACK | 0 refills | Status: DC

## 2020-09-06 MED ORDER — TRUEPLUS LANCETS 28G MISC: 3 refills | Status: DC

## 2020-09-06 MED ORDER — IMIPRAMINE HCL 25 MG PO TABS: 25.0000 mg | ORAL_TABLET | Freq: Every day | ORAL | 1 refills | Status: DC

## 2020-09-06 MED ORDER — TRUE METRIX BLOOD GLUCOSE TEST VI STRP: ORAL_STRIP | 12 refills | Status: DC

## 2020-09-08 MED FILL — TRUE METRIX GLUCOSE TEST ST: 100 days supply | Qty: 100 | Fill #0

## 2020-09-08 MED FILL — !TRUE METRIX BLOOD GLUCOSE: 1 days supply | Qty: 1 | Fill #0

## 2020-09-08 MED FILL — TRUEplus LANCETS 28G MISC: 100 days supply | Qty: 100 | Fill #0

## 2020-09-08 MED FILL — IMIPRAMINE HCL 25 MG TABLET: 25 | 30 days supply | Qty: 30 | Fill #0

## 2020-09-08 MED FILL — ATORVASTATIN CALCIUM 40 MG: 40 | 30 days supply | Qty: 30 | Fill #0

## 2020-09-16 ENCOUNTER — Ambulatory Visit: Payer: Self-pay | Attending: Nurse Practitioner

## 2020-09-16 ENCOUNTER — Other Ambulatory Visit: Payer: Self-pay

## 2020-09-16 ENCOUNTER — Other Ambulatory Visit: Payer: Self-pay | Admitting: Nurse Practitioner

## 2020-09-16 DIAGNOSIS — I10 Essential (primary) hypertension: Secondary | ICD-10-CM

## 2020-09-16 DIAGNOSIS — E785 Hyperlipidemia, unspecified: Secondary | ICD-10-CM

## 2020-09-16 DIAGNOSIS — D649 Anemia, unspecified: Secondary | ICD-10-CM

## 2020-09-17 LAB — CMP14+EGFR
ALT: 27 IU/L (ref 0–44)
AST: 14 IU/L (ref 0–40)
Albumin/Globulin Ratio: 1.2 (ref 1.2–2.2)
Albumin: 3.6 g/dL — ABNORMAL LOW (ref 4.0–5.0)
Alkaline Phosphatase: 172 IU/L — ABNORMAL HIGH (ref 44–121)
BUN/Creatinine Ratio: 7 — ABNORMAL LOW (ref 9–20)
BUN: 40 mg/dL — ABNORMAL HIGH (ref 6–20)
Bilirubin Total: 0.4 mg/dL (ref 0.0–1.2)
CO2: 26 mmol/L (ref 20–29)
Calcium: 7.9 mg/dL — ABNORMAL LOW (ref 8.7–10.2)
Chloride: 99 mmol/L (ref 96–106)
Creatinine, Ser: 6.09 mg/dL — ABNORMAL HIGH (ref 0.76–1.27)
Globulin, Total: 3.1 g/dL (ref 1.5–4.5)
Glucose: 145 mg/dL — ABNORMAL HIGH (ref 65–99)
Potassium: 4.6 mmol/L (ref 3.5–5.2)
Sodium: 143 mmol/L (ref 134–144)
Total Protein: 6.7 g/dL (ref 6.0–8.5)
eGFR: 12 mL/min/{1.73_m2} — ABNORMAL LOW (ref >59)

## 2020-09-17 LAB — CBC
Hematocrit: 26.5 % — ABNORMAL LOW (ref 37.5–51.0)
Hemoglobin: 8.1 g/dL — ABNORMAL LOW (ref 13.0–17.7)
MCH: 26.1 pg — ABNORMAL LOW (ref 26.6–33.0)
MCHC: 30.6 g/dL — ABNORMAL LOW (ref 31.5–35.7)
MCV: 86 fL (ref 79–97)
Platelets: 272 10*3/uL (ref 150–450)
RBC: 3.1 x10E6/uL — ABNORMAL LOW (ref 4.14–5.80)
RDW: 15.3 % (ref 11.6–15.4)
WBC: 6.3 10*3/uL (ref 3.4–10.8)

## 2020-09-17 LAB — LIPID PANEL
Chol/HDL Ratio: 2.6 ratio (ref 0.0–5.0)
Cholesterol, Total: 146 mg/dL (ref 100–199)
HDL: 56 mg/dL (ref >39)
LDL Chol Calc (NIH): 74 mg/dL (ref 0–99)
Triglycerides: 82 mg/dL (ref 0–149)
VLDL Cholesterol Cal: 16 mg/dL (ref 5–40)

## 2020-09-19 ENCOUNTER — Other Ambulatory Visit: Payer: Self-pay | Admitting: *Deleted

## 2020-09-19 DIAGNOSIS — N184 Chronic kidney disease, stage 4 (severe): Secondary | ICD-10-CM

## 2020-09-19 DIAGNOSIS — N179 Acute kidney failure, unspecified: Secondary | ICD-10-CM

## 2020-09-22 ENCOUNTER — Other Ambulatory Visit: Payer: Self-pay | Admitting: Nurse Practitioner

## 2020-09-22 DIAGNOSIS — R748 Abnormal levels of other serum enzymes: Secondary | ICD-10-CM

## 2020-09-23 ENCOUNTER — Ambulatory Visit (HOSPITAL_COMMUNITY): Payer: Self-pay | Attending: Vascular Surgery

## 2020-10-11 ENCOUNTER — Other Ambulatory Visit: Payer: Self-pay

## 2020-10-11 DIAGNOSIS — N179 Acute kidney failure, unspecified: Secondary | ICD-10-CM

## 2020-10-20 ENCOUNTER — Other Ambulatory Visit: Payer: Self-pay

## 2020-10-20 MED ORDER — SEVELAMER CARBONATE 800 MG PO TABS
1600.0000 mg | ORAL_TABLET | Freq: Three times a day (TID) | ORAL | 0 refills | Status: AC
  Filled 2020-10-20 – 2020-11-13 (×2): qty 180, 30d supply, fill #0

## 2020-10-27 ENCOUNTER — Other Ambulatory Visit: Payer: Self-pay

## 2020-10-28 ENCOUNTER — Other Ambulatory Visit: Payer: Self-pay

## 2020-10-28 ENCOUNTER — Encounter: Payer: Self-pay | Admitting: Physician Assistant

## 2020-10-28 ENCOUNTER — Ambulatory Visit (HOSPITAL_COMMUNITY)
Admission: RE | Admit: 2020-10-28 | Discharge: 2020-10-28 | Disposition: A | Discharge status: Home / Self Care | Payer: Medicaid Other | Source: Ambulatory Visit | Attending: Vascular Surgery | Admitting: Vascular Surgery

## 2020-10-28 ENCOUNTER — Ambulatory Visit (INDEPENDENT_AMBULATORY_CARE_PROVIDER_SITE_OTHER): Payer: Self-pay | Admitting: Physician Assistant

## 2020-10-28 VITALS — BP 176/112 | HR 93 | Temp 98.4°F | Resp 20 | Ht 72.0 in | Wt 205.0 lb

## 2020-10-28 DIAGNOSIS — N179 Acute kidney failure, unspecified: Secondary | ICD-10-CM | POA: Diagnosis not present

## 2020-10-28 DIAGNOSIS — N186 End stage renal disease: Secondary | ICD-10-CM

## 2020-10-28 DIAGNOSIS — N184 Chronic kidney disease, stage 4 (severe): Secondary | ICD-10-CM | POA: Insufficient documentation

## 2020-10-28 DIAGNOSIS — Z992 Dependence on renal dialysis: Secondary | ICD-10-CM

## 2020-10-28 MED FILL — Carvedilol Tab 6.25 MG: ORAL | 30 days supply | Qty: 60 | Fill #0 | Status: CN

## 2020-10-28 MED FILL — Torsemide Tab 20 MG: ORAL | 24 days supply | Qty: 48 | Fill #0 | Status: CN

## 2020-10-28 NOTE — Progress Notes (Signed)
    Postoperative Access Visit   History of Present Illness   [name] is a 35 y.o. year old male who presents for postoperative follow-up for: left upper extremity radiocephalic AV fistula by Dr. Stanford Breed on 08/11/20. The patient's wounds are well healed.  The patient notes no steal symptoms.  He is currently dialyzing via right IJ TDC on MWF at [facility].  Physical Examination   Vitals:   10/28/20 1525  BP: (!) 176/112  Pulse: 93  Resp: 20  Temp: 98.4 F (36.9 C)  SpO2: 96%  Weight: 205 lb 0.4 oz (93 kg)  Height: 6' (1.829 m)   Body mass index is 27.81 kg/m.  left arm Incision is well healed, 2+ radial pulse, hand grip is 5/5, sensation in digits is intact, palpable thrill, bruit can be auscultated     Non- Invasive Vascular lab: 10/28/20 Findings:  +--------------------+----------+-----------------+--------+  AVF         PSV (cm/s)Flow Vol (mL/min)Comments  +--------------------+----------+-----------------+--------+  Native artery inflow  271     1083          +--------------------+----------+-----------------+--------+  AVF Anastomosis     689                 +--------------------+----------+-----------------+--------+     +------------+----------+-------------+----------+-------------------------  ----+  OUTFLOW VEINPSV (cm/s)Diameter (cm)Depth (cm)     Describe        +------------+----------+-------------+----------+-------------------------  ----+  AC Fossa    15    0.51     0.18      Retained valve       +------------+----------+-------------+----------+-------------------------  ----+  Prox Forearm  70    0.48     0.18                   +------------+----------+-------------+----------+-------------------------  ----+  Mid Forearm   173    0.48     0.18                    +------------+----------+-------------+----------+-------------------------  ----+  Dist Forearm  249    0.51     0.24   competing branch 70  cm/s,                           0.13 dia , retained  valve   +------------+----------+-------------+----------+-------------------------  ----+   Medical Decision Making    [name] is a 35 y.o. year old male who presents s/p left upper extremity radiocephalic AV fistula by Dr. Stanford Breed on 08/11/20.  His incision is well healed. Duplex today shows adequate flow volume, fistula is of adequate depth and diameter. Patent is without signs or symptoms of steal syndrome  The patient's access will be ready for use after 11/08/20  The patient's tunneled dialysis catheter can be removed when Nephrology is comfortable with the performance of the left radiocephalic AV fistula  The patient may follow up on a prn basis   Karoline Caldwell, PA-C [facility] Office: [facility] MD: Stanford Breed

## 2020-11-03 ENCOUNTER — Telehealth: Payer: Self-pay | Admitting: Licensed Clinical Social Worker

## 2020-11-03 NOTE — Telephone Encounter (Signed)
CSW received referral from [facility] staff for medication assistance. CSW reached out to patient and message left for return call. [name], [name], [name]

## 2020-11-04 ENCOUNTER — Other Ambulatory Visit: Payer: Self-pay

## 2020-11-04 ENCOUNTER — Other Ambulatory Visit: Payer: Self-pay | Admitting: Nurse Practitioner

## 2020-11-04 NOTE — Telephone Encounter (Signed)
Medication Refill - Medication:  .swsevelamer carbonate (RENVELA) 800 MG tablet, carvedilol (COREG) 6.25 MG tablet and torsemide (DEMADEX) 20 MG tablet   Has the patient contacted their pharmacy? Yes.    (Agent: If yes, when and what did the pharmacy advise?) The pharmacy advised medication was left on the shelf to long and to contact PCP office  Preferred Pharmacy (with phone number or street name):  [facility] Phone:  [phone]  Fax:  [phone]      Agent: Please be advised that RX refills may take up to 3 business days. We ask that you follow-up with your pharmacy.

## 2020-11-04 NOTE — Telephone Encounter (Signed)
Requested medication (s) are due for refill today - yes  Requested medication (s) are on the active medication list -yes  Future visit scheduled -yes  Last refill: 2 months ago, 2 weeks ago  Notes to clinic: Patient is requesting Rx refill of medication prescribed by outside provider- sent for PCP review   Requested Prescriptions  Pending Prescriptions Disp Refills   carvedilol (COREG) 6.25 MG tablet 60 tablet 1    Sig: TAKE 1 TABLET (6.25 MG TOTAL) BY MOUTH TWO TIMES DAILY WITH A MEAL.      Cardiovascular:  Beta Blockers Failed - 11/04/2020  3:58 PM      Failed - Last BP in normal range    BP Readings from Last 1 Encounters:  10/28/20 (!) 176/112          Passed - Last Heart Rate in normal range    Pulse Readings from Last 1 Encounters:  10/28/20 93          Passed - Valid encounter within last 6 months    Recent Outpatient Visits           2 months ago Encounter to establish care   [place], Vernia Buff, NP       Future Appointments             In 3 weeks Gildardo Pounds, NP [place]               sevelamer carbonate (RENVELA) 800 MG tablet 180 tablet 0    Sig: Take 2 tablets (1,600 mg total) by mouth 3 (three) times daily with meals      Endocrinology:  Phosphate Binders Failed - 11/04/2020  3:58 PM      Failed - Ca in normal range and within 360 days    Calcium  Date Value Ref Range Status  09/16/2020 7.9 (L) 8.7 - 10.2 mg/dL Final   Calcium, Total (PTH)  Date Value Ref Range Status  08/08/2020 7.2 (L) 8.7 - 10.2 mg/dL Final   Calcium, Ion  Date Value Ref Range Status  02/17/2009 1.07 (L) 1.12 - 1.32 mmol/L Final          Failed - Phosphate in normal range and within 360 days    Phosphorus  Date Value Ref Range Status  08/07/2020 7.0 (H) 2.5 - 4.6 mg/dL Final    Comment:    Performed at [facility], [address], [place], [place] [address]          Failed -  Albumin in normal range and within 360 days    Albumin  Date Value Ref Range Status  09/16/2020 3.6 (L) 4.0 - 5.0 g/dL Final   Albumin ELP  Date Value Ref Range Status  02/26/2020 2.4 (L) 2.9 - 4.4 g/dL Final          Failed - Cr in normal range and within 360 days    Creatinine, Ser  Date Value Ref Range Status  09/16/2020 6.09 (H) 0.76 - 1.27 mg/dL Final   Creatinine, Urine  Date Value Ref Range Status  08/07/2020 85.89 mg/dL Final    Comment:    Performed at [facility], [place] [address] [place], [place] [address]          Failed - PTH in normal range and within 360 days    PTH Interp  Date Value Ref Range Status  08/08/2020 Comment  Final  Comment:    (NOTE) Interpretation                 Intact PTH    Calcium                                (pg/mL)      (mg/dL) Normal                          15 - 65     8.6 - 10.2 Primary Hyperparathyroidism         >65          >10.2 Secondary Hyperparathyroidism       >65          <10.2 Non-Parathyroid Hypercalcemia       <65          >10.2 Hypoparathyroidism                  <15          < 8.6 Non-Parathyroid Hypocalcemia    15 - 65          < 8.6 Performed At: [facility] [address] [place], [place] [id number] Rush Farmer MD [id number]    PTH  Date Value Ref Range Status  08/08/2020 113 (H) 15 - 65 pg/mL Final          Passed - Valid encounter within last 12 months    Recent Outpatient Visits           2 months ago Encounter to establish care   [place], Vernia Buff, NP       Future Appointments             In 3 weeks Gildardo Pounds, NP [place]               torsemide (DEMADEX) 20 MG tablet 48 tablet 1    Sig: TAKE 2 TABLETS (40 MG) DAILY ON NON HEMODIALYSIS DAYS      Cardiovascular:  Diuretics - Loop Failed - 11/04/2020  3:58 PM      Failed - Ca in normal range and within 360 days    Calcium  Date  Value Ref Range Status  09/16/2020 7.9 (L) 8.7 - 10.2 mg/dL Final   Calcium, Total (PTH)  Date Value Ref Range Status  08/08/2020 7.2 (L) 8.7 - 10.2 mg/dL Final   Calcium, Ion  Date Value Ref Range Status  02/17/2009 1.07 (L) 1.12 - 1.32 mmol/L Final          Failed - Cr in normal range and within 360 days    Creatinine, Ser  Date Value Ref Range Status  09/16/2020 6.09 (H) 0.76 - 1.27 mg/dL Final   Creatinine, Urine  Date Value Ref Range Status  08/07/2020 85.89 mg/dL Final    Comment:    Performed at [facility], [address], [place], [place] [address]          Failed - Last BP in normal range    BP Readings from Last 1 Encounters:  10/28/20 (!) 176/112          Passed - K in normal range and within 360 days    Potassium  Date Value Ref Range Status  09/16/2020 4.6 3.5 - 5.2 mmol/L Final  Passed - Na in normal range and within 360 days    Sodium  Date Value Ref Range Status  09/16/2020 143 134 - 144 mmol/L Final          Passed - Valid encounter within last 6 months    Recent Outpatient Visits           2 months ago Encounter to establish care   [place], Zelda W, NP       Future Appointments             In 3 weeks Gildardo Pounds, NP [place]                 Requested Prescriptions  Pending Prescriptions Disp Refills   carvedilol (COREG) 6.25 MG tablet 60 tablet 1    Sig: TAKE 1 TABLET (6.25 MG TOTAL) BY MOUTH TWO TIMES DAILY WITH A MEAL.      Cardiovascular:  Beta Blockers Failed - 11/04/2020  3:58 PM      Failed - Last BP in normal range    BP Readings from Last 1 Encounters:  10/28/20 (!) 176/112          Passed - Last Heart Rate in normal range    Pulse Readings from Last 1 Encounters:  10/28/20 93          Passed - Valid encounter within last 6 months    Recent Outpatient Visits           2 months ago Encounter to establish  care   [place], Vernia Buff, NP       Future Appointments             In 3 weeks Gildardo Pounds, NP [place]               sevelamer carbonate (RENVELA) 800 MG tablet 180 tablet 0    Sig: Take 2 tablets (1,600 mg total) by mouth 3 (three) times daily with meals      Endocrinology:  Phosphate Binders Failed - 11/04/2020  3:58 PM      Failed - Ca in normal range and within 360 days    Calcium  Date Value Ref Range Status  09/16/2020 7.9 (L) 8.7 - 10.2 mg/dL Final   Calcium, Total (PTH)  Date Value Ref Range Status  08/08/2020 7.2 (L) 8.7 - 10.2 mg/dL Final   Calcium, Ion  Date Value Ref Range Status  02/17/2009 1.07 (L) 1.12 - 1.32 mmol/L Final          Failed - Phosphate in normal range and within 360 days    Phosphorus  Date Value Ref Range Status  08/07/2020 7.0 (H) 2.5 - 4.6 mg/dL Final    Comment:    Performed at [facility], [address], [place], [place] [address]          Failed - Albumin in normal range and within 360 days    Albumin  Date Value Ref Range Status  09/16/2020 3.6 (L) 4.0 - 5.0 g/dL Final   Albumin ELP  Date Value Ref Range Status  02/26/2020 2.4 (L) 2.9 - 4.4 g/dL Final          Failed - Cr in normal range and within 360 days    Creatinine, Ser  Date Value Ref Range Status  09/16/2020 6.09 (H) 0.76 - 1.27  mg/dL Final   Creatinine, Urine  Date Value Ref Range Status  08/07/2020 85.89 mg/dL Final    Comment:    Performed at [facility], [place] [address], [place], [place] [address]          Failed - PTH in normal range and within 360 days    PTH Interp  Date Value Ref Range Status  08/08/2020 Comment  Final    Comment:    (NOTE) Interpretation                 Intact PTH    Calcium                                (pg/mL)      (mg/dL) Normal                          15 - 65     8.6 - 10.2 Primary Hyperparathyroidism         >65           >10.2 Secondary Hyperparathyroidism       >65          <10.2 Non-Parathyroid Hypercalcemia       <65          >10.2 Hypoparathyroidism                  <15          < 8.6 Non-Parathyroid Hypocalcemia    15 - 65          < 8.6 Performed At: [facility] [address] [place], [place] [id number] Rush Farmer MD [id number]    PTH  Date Value Ref Range Status  08/08/2020 113 (H) 15 - 65 pg/mL Final          Passed - Valid encounter within last 12 months    Recent Outpatient Visits           2 months ago Encounter to establish care   [place], Vernia Buff, NP       Future Appointments             In 3 weeks Gildardo Pounds, NP [place]               torsemide (DEMADEX) 20 MG tablet 48 tablet 1    Sig: TAKE 2 TABLETS (40 MG) DAILY ON NON HEMODIALYSIS DAYS      Cardiovascular:  Diuretics - Loop Failed - 11/04/2020  3:58 PM      Failed - Ca in normal range and within 360 days    Calcium  Date Value Ref Range Status  09/16/2020 7.9 (L) 8.7 - 10.2 mg/dL Final   Calcium, Total (PTH)  Date Value Ref Range Status  08/08/2020 7.2 (L) 8.7 - 10.2 mg/dL Final   Calcium, Ion  Date Value Ref Range Status  02/17/2009 1.07 (L) 1.12 - 1.32 mmol/L Final          Failed - Cr in normal range and within 360 days    Creatinine, Ser  Date Value Ref Range Status  09/16/2020 6.09 (H) 0.76 - 1.27 mg/dL Final   Creatinine, Urine  Date Value Ref Range Status  08/07/2020 85.89 mg/dL Final    Comment:    Performed at [facility], [address] [place], [place] [address]          Failed - Last BP in normal range    BP Readings from Last 1 Encounters:  10/28/20 (!) 176/112          Passed - K in normal range and within 360 days    Potassium  Date Value Ref Range Status  09/16/2020 4.6 3.5 - 5.2 mmol/L Final          Passed - Na in normal range and within 360 days    Sodium  Date  Value Ref Range Status  09/16/2020 143 134 - 144 mmol/L Final          Passed - Valid encounter within last 6 months    Recent Outpatient Visits           2 months ago Encounter to establish care   [place], Zelda W, NP       Future Appointments             In 3 weeks Gildardo Pounds, NP [place]

## 2020-11-06 ENCOUNTER — Telehealth: Payer: Self-pay | Admitting: Licensed Clinical Social Worker

## 2020-11-06 NOTE — Telephone Encounter (Signed)
CSW received referral to assist patient with direction on obtaining his diabetic medications. CSW attempted contact with no success and message left. CSW encouraged patient to contact his PCP at [facility] where he could get the needed medications and further advice on patient assistance programs for diabetic needs. CSW left contact info if needed. Raquel Sarna, [place], [place]

## 2020-11-12 ENCOUNTER — Other Ambulatory Visit: Payer: Self-pay

## 2020-11-13 ENCOUNTER — Other Ambulatory Visit: Payer: Self-pay

## 2020-11-13 MED FILL — Carvedilol Tab 6.25 MG: ORAL | 30 days supply | Qty: 60 | Fill #0 | Status: AC

## 2020-11-13 MED FILL — Losartan Potassium Tab 50 MG: ORAL | 30 days supply | Qty: 30 | Fill #0 | Status: AC

## 2020-11-13 MED FILL — Torsemide Tab 20 MG: ORAL | 24 days supply | Qty: 48 | Fill #0 | Status: AC

## 2020-11-13 MED FILL — Atorvastatin Calcium Tab 40 MG (Base Equivalent): ORAL | 30 days supply | Qty: 30 | Fill #0 | Status: AC

## 2020-11-13 MED FILL — Imipramine HCl Tab 25 MG: ORAL | 30 days supply | Qty: 30 | Fill #0 | Status: AC

## 2020-11-17 ENCOUNTER — Other Ambulatory Visit: Payer: Self-pay

## 2020-11-24 ENCOUNTER — Inpatient Hospital Stay (HOSPITAL_COMMUNITY)
Admission: EM | Admit: 2020-11-24 | Discharge: 2020-11-27 | DRG: 304 | Disposition: A | Discharge status: Home / Self Care | Payer: Medicaid Other | Attending: Internal Medicine | Admitting: Internal Medicine

## 2020-11-24 ENCOUNTER — Emergency Department (HOSPITAL_COMMUNITY): Payer: Medicaid Other

## 2020-11-24 ENCOUNTER — Other Ambulatory Visit: Payer: Self-pay

## 2020-11-24 ENCOUNTER — Emergency Department (HOSPITAL_COMMUNITY)
Admission: EM | Admit: 2020-11-24 | Discharge: 2020-11-24 | Disposition: A | Discharge status: Home / Self Care | Payer: Medicaid Other | Attending: Emergency Medicine | Admitting: Emergency Medicine

## 2020-11-24 ENCOUNTER — Encounter (HOSPITAL_COMMUNITY): Payer: Self-pay

## 2020-11-24 ENCOUNTER — Encounter (HOSPITAL_COMMUNITY): Payer: Self-pay | Admitting: *Deleted

## 2020-11-24 DIAGNOSIS — E875 Hyperkalemia: Secondary | ICD-10-CM | POA: Diagnosis present

## 2020-11-24 DIAGNOSIS — I120 Hypertensive chronic kidney disease with stage 5 chronic kidney disease or end stage renal disease: Secondary | ICD-10-CM | POA: Diagnosis present

## 2020-11-24 DIAGNOSIS — E109 Type 1 diabetes mellitus without complications: Secondary | ICD-10-CM

## 2020-11-24 DIAGNOSIS — H532 Diplopia: Secondary | ICD-10-CM | POA: Diagnosis present

## 2020-11-24 DIAGNOSIS — Z9115 Patient's noncompliance with renal dialysis: Secondary | ICD-10-CM

## 2020-11-24 DIAGNOSIS — L97511 Non-pressure chronic ulcer of other part of right foot limited to breakdown of skin: Secondary | ICD-10-CM

## 2020-11-24 DIAGNOSIS — N186 End stage renal disease: Secondary | ICD-10-CM | POA: Insufficient documentation

## 2020-11-24 DIAGNOSIS — Z5321 Procedure and treatment not carried out due to patient leaving prior to being seen by health care provider: Secondary | ICD-10-CM | POA: Insufficient documentation

## 2020-11-24 DIAGNOSIS — N185 Chronic kidney disease, stage 5: Secondary | ICD-10-CM | POA: Diagnosis not present

## 2020-11-24 DIAGNOSIS — H538 Other visual disturbances: Secondary | ICD-10-CM | POA: Insufficient documentation

## 2020-11-24 DIAGNOSIS — E1122 Type 2 diabetes mellitus with diabetic chronic kidney disease: Secondary | ICD-10-CM

## 2020-11-24 DIAGNOSIS — E7800 Pure hypercholesterolemia, unspecified: Secondary | ICD-10-CM | POA: Diagnosis present

## 2020-11-24 DIAGNOSIS — E785 Hyperlipidemia, unspecified: Secondary | ICD-10-CM | POA: Diagnosis present

## 2020-11-24 DIAGNOSIS — L97509 Non-pressure chronic ulcer of other part of unspecified foot with unspecified severity: Secondary | ICD-10-CM | POA: Diagnosis not present

## 2020-11-24 DIAGNOSIS — L089 Local infection of the skin and subcutaneous tissue, unspecified: Secondary | ICD-10-CM | POA: Diagnosis present

## 2020-11-24 DIAGNOSIS — H547 Unspecified visual loss: Secondary | ICD-10-CM | POA: Diagnosis present

## 2020-11-24 DIAGNOSIS — Z79899 Other long term (current) drug therapy: Secondary | ICD-10-CM

## 2020-11-24 DIAGNOSIS — E119 Type 2 diabetes mellitus without complications: Secondary | ICD-10-CM | POA: Insufficient documentation

## 2020-11-24 DIAGNOSIS — I161 Hypertensive emergency: Secondary | ICD-10-CM | POA: Diagnosis present

## 2020-11-24 DIAGNOSIS — Z20822 Contact with and (suspected) exposure to covid-19: Secondary | ICD-10-CM | POA: Diagnosis present

## 2020-11-24 DIAGNOSIS — Q631 Lobulated, fused and horseshoe kidney: Secondary | ICD-10-CM

## 2020-11-24 DIAGNOSIS — N189 Chronic kidney disease, unspecified: Secondary | ICD-10-CM | POA: Diagnosis present

## 2020-11-24 DIAGNOSIS — N2581 Secondary hyperparathyroidism of renal origin: Secondary | ICD-10-CM | POA: Diagnosis present

## 2020-11-24 DIAGNOSIS — Z992 Dependence on renal dialysis: Secondary | ICD-10-CM | POA: Diagnosis not present

## 2020-11-24 DIAGNOSIS — E11621 Type 2 diabetes mellitus with foot ulcer: Secondary | ICD-10-CM | POA: Diagnosis present

## 2020-11-24 DIAGNOSIS — Z833 Family history of diabetes mellitus: Secondary | ICD-10-CM | POA: Diagnosis not present

## 2020-11-24 DIAGNOSIS — I10 Essential (primary) hypertension: Secondary | ICD-10-CM

## 2020-11-24 DIAGNOSIS — Z888 Allergy status to other drugs, medicaments and biological substances status: Secondary | ICD-10-CM | POA: Diagnosis not present

## 2020-11-24 DIAGNOSIS — J9601 Acute respiratory failure with hypoxia: Secondary | ICD-10-CM | POA: Diagnosis present

## 2020-11-24 DIAGNOSIS — Z9114 Patient's other noncompliance with medication regimen: Secondary | ICD-10-CM

## 2020-11-24 DIAGNOSIS — D631 Anemia in chronic kidney disease: Secondary | ICD-10-CM | POA: Diagnosis present

## 2020-11-24 DIAGNOSIS — Z794 Long term (current) use of insulin: Secondary | ICD-10-CM

## 2020-11-24 DIAGNOSIS — L97519 Non-pressure chronic ulcer of other part of right foot with unspecified severity: Secondary | ICD-10-CM | POA: Diagnosis present

## 2020-11-24 LAB — CBC WITH DIFFERENTIAL/PLATELET
Abs Immature Granulocytes: 0.02 10*3/uL (ref 0.00–0.07)
Basophils Absolute: 0.1 10*3/uL (ref 0.0–0.1)
Basophils Relative: 1 %
Eosinophils Absolute: 0.1 10*3/uL (ref 0.0–0.5)
Eosinophils Relative: 2 %
HCT: 30.9 % — ABNORMAL LOW (ref 39.0–52.0)
Hemoglobin: 9.5 g/dL — ABNORMAL LOW (ref 13.0–17.0)
Immature Granulocytes: 0 %
Lymphocytes Relative: 12 %
Lymphs Abs: 0.8 10*3/uL (ref 0.7–4.0)
MCH: 26.8 pg (ref 26.0–34.0)
MCHC: 30.7 g/dL (ref 30.0–36.0)
MCV: 87.3 fL (ref 80.0–100.0)
Monocytes Absolute: 0.5 10*3/uL (ref 0.1–1.0)
Monocytes Relative: 7 %
Neutro Abs: 5.2 10*3/uL (ref 1.7–7.7)
Neutrophils Relative %: 78 %
Platelets: 352 10*3/uL (ref 150–400)
RBC: 3.54 MIL/uL — ABNORMAL LOW (ref 4.22–5.81)
RDW: 21.2 % — ABNORMAL HIGH (ref 11.5–15.5)
WBC: 6.7 10*3/uL (ref 4.0–10.5)
nRBC: 0 % (ref 0.0–0.2)

## 2020-11-24 LAB — COMPREHENSIVE METABOLIC PANEL
ALT: 58 U/L — ABNORMAL HIGH (ref 0–44)
AST: 19 U/L (ref 15–41)
Albumin: 3.2 g/dL — ABNORMAL LOW (ref 3.5–5.0)
Alkaline Phosphatase: 243 U/L — ABNORMAL HIGH (ref 38–126)
Anion gap: 13 (ref 5–15)
BUN: 84 mg/dL — ABNORMAL HIGH (ref 6–20)
CO2: 25 mmol/L (ref 22–32)
Calcium: 8.4 mg/dL — ABNORMAL LOW (ref 8.9–10.3)
Chloride: 100 mmol/L (ref 98–111)
Creatinine, Ser: 12.6 mg/dL — ABNORMAL HIGH (ref 0.61–1.24)
GFR, Estimated: 5 mL/min — ABNORMAL LOW (ref >60)
Glucose, Bld: 120 mg/dL — ABNORMAL HIGH (ref 70–99)
Potassium: 6.3 mmol/L (ref 3.5–5.1)
Sodium: 138 mmol/L (ref 135–145)
Total Bilirubin: 1.3 mg/dL — ABNORMAL HIGH (ref 0.3–1.2)
Total Protein: 7.6 g/dL (ref 6.5–8.1)

## 2020-11-24 LAB — RESP PANEL BY RT-PCR (FLU A&B, COVID) ARPGX2
Influenza A by PCR: NEGATIVE
Influenza B by PCR: NEGATIVE
SARS Coronavirus 2 by RT PCR: NEGATIVE

## 2020-11-24 LAB — CBG MONITORING, ED
Glucose-Capillary: 113 mg/dL — ABNORMAL HIGH (ref 70–99)
Glucose-Capillary: 121 mg/dL — ABNORMAL HIGH (ref 70–99)
Glucose-Capillary: 159 mg/dL — ABNORMAL HIGH (ref 70–99)

## 2020-11-24 LAB — LACTIC ACID, PLASMA
Lactic Acid, Venous: 0.8 mmol/L (ref 0.5–1.9)
Lactic Acid, Venous: 0.9 mmol/L (ref 0.5–1.9)

## 2020-11-24 MED ORDER — LIDOCAINE HCL (PF) 1 % IJ SOLN: 5.0000 mL | INTRAMUSCULAR | Status: DC | PRN

## 2020-11-24 MED ORDER — ACETAMINOPHEN 650 MG RE SUPP: 650.0000 mg | Freq: Four times a day (QID) | RECTAL | Status: DC | PRN

## 2020-11-24 MED ORDER — PIPERACILLIN-TAZOBACTAM 3.375 G IVPB 30 MIN
3.3750 g | Freq: Once | INTRAVENOUS | Status: AC
  Administered 2020-11-24: 3.375 g via INTRAVENOUS
  Filled 2020-11-24: qty 50

## 2020-11-24 MED ORDER — SODIUM CHLORIDE 0.9% FLUSH: 3.0000 mL | INTRAVENOUS | Status: DC | PRN

## 2020-11-24 MED ORDER — SODIUM CHLORIDE 0.9 % IV SOLN: 100.0000 mL | INTRAVENOUS | Status: DC | PRN

## 2020-11-24 MED ORDER — INSULIN DETEMIR 100 UNIT/ML ~~LOC~~ SOLN
10.0000 [IU] | Freq: Every day | SUBCUTANEOUS | Status: DC
  Administered 2020-11-26 – 2020-11-27 (×2): 10 [IU] via SUBCUTANEOUS
  Filled 2020-11-24 (×3): qty 0.1

## 2020-11-24 MED ORDER — PENTAFLUOROPROP-TETRAFLUOROETH EX AERO
1.0000 "application " | INHALATION_SPRAY | CUTANEOUS | Status: DC | PRN
  Filled 2020-11-24: qty 116

## 2020-11-24 MED ORDER — ALTEPLASE 2 MG IJ SOLR: 2.0000 mg | Freq: Once | INTRAMUSCULAR | Status: DC | PRN

## 2020-11-24 MED ORDER — SODIUM CHLORIDE 0.9 % IV SOLN: 250.0000 mL | INTRAVENOUS | Status: DC | PRN

## 2020-11-24 MED ORDER — VANCOMYCIN HCL 2000 MG/400ML IV SOLN
2000.0000 mg | Freq: Once | INTRAVENOUS | Status: AC
  Administered 2020-11-25: 2000 mg via INTRAVENOUS
  Filled 2020-11-24: qty 400

## 2020-11-24 MED ORDER — INSULIN ASPART 100 UNIT/ML IJ SOLN
0.0000 [IU] | INTRAMUSCULAR | Status: DC
  Administered 2020-11-25: 1 [IU] via SUBCUTANEOUS

## 2020-11-24 MED ORDER — LIDOCAINE-PRILOCAINE 2.5-2.5 % EX CREA
1.0000 "application " | TOPICAL_CREAM | CUTANEOUS | Status: DC | PRN
  Filled 2020-11-24: qty 5

## 2020-11-24 MED ORDER — HYDROCODONE-ACETAMINOPHEN 5-325 MG PO TABS: 1.0000 | ORAL_TABLET | ORAL | Status: DC | PRN

## 2020-11-24 MED ORDER — METRONIDAZOLE 500 MG/100ML IV SOLN
500.0000 mg | Freq: Three times a day (TID) | INTRAVENOUS | Status: DC
  Administered 2020-11-24 – 2020-11-26 (×4): 500 mg via INTRAVENOUS
  Filled 2020-11-24 (×4): qty 100

## 2020-11-24 MED ORDER — SODIUM ZIRCONIUM CYCLOSILICATE 10 G PO PACK
10.0000 g | PACK | Freq: Once | ORAL | Status: AC
  Administered 2020-11-24: 10 g via ORAL
  Filled 2020-11-24: qty 1

## 2020-11-24 MED ORDER — SODIUM CHLORIDE 0.9 % IV SOLN
2.0000 g | INTRAVENOUS | Status: DC
  Filled 2020-11-24: qty 20

## 2020-11-24 MED ORDER — SEVELAMER CARBONATE 800 MG PO TABS
800.0000 mg | ORAL_TABLET | Freq: Three times a day (TID) | ORAL | Status: DC
  Administered 2020-11-25 – 2020-11-27 (×7): 800 mg via ORAL
  Filled 2020-11-24 (×8): qty 1

## 2020-11-24 MED ORDER — SODIUM CHLORIDE 0.9% FLUSH
3.0000 mL | Freq: Two times a day (BID) | INTRAVENOUS | Status: DC
  Administered 2020-11-24 – 2020-11-27 (×5): 3 mL via INTRAVENOUS

## 2020-11-24 MED ORDER — VANCOMYCIN HCL 1000 MG/200ML IV SOLN: 1000.0000 mg | INTRAVENOUS | Status: DC

## 2020-11-24 MED ORDER — ACETAMINOPHEN 325 MG PO TABS: 650.0000 mg | ORAL_TABLET | Freq: Four times a day (QID) | ORAL | Status: DC | PRN

## 2020-11-24 MED ORDER — CARVEDILOL 6.25 MG PO TABS
6.2500 mg | ORAL_TABLET | Freq: Two times a day (BID) | ORAL | Status: DC
  Administered 2020-11-25 – 2020-11-27 (×5): 6.25 mg via ORAL
  Filled 2020-11-24 (×4): qty 1
  Filled 2020-11-24: qty 2

## 2020-11-24 MED ORDER — CHLORHEXIDINE GLUCONATE CLOTH 2 % EX PADS
6.0000 | MEDICATED_PAD | Freq: Every day | CUTANEOUS | Status: DC
  Administered 2020-11-26 – 2020-11-27 (×2): 6 via TOPICAL

## 2020-11-24 MED ORDER — LABETALOL HCL 5 MG/ML IV SOLN
10.0000 mg | INTRAVENOUS | Status: DC | PRN
  Administered 2020-11-25: 10 mg via INTRAVENOUS
  Filled 2020-11-24: qty 4

## 2020-11-24 MED ORDER — ATORVASTATIN CALCIUM 40 MG PO TABS
40.0000 mg | ORAL_TABLET | Freq: Every day | ORAL | Status: DC
  Administered 2020-11-24 – 2020-11-27 (×3): 40 mg via ORAL
  Filled 2020-11-24 (×3): qty 1

## 2020-11-24 NOTE — ED Notes (Signed)
Dr. Francia Greaves notified of critical potassium

## 2020-11-24 NOTE — ED Provider Notes (Signed)
Emergency Medicine Provider Triage Evaluation Note  [name] , a 35 y.o. male  was evaluated in triage.  Pt complains of toe pain and drainage x yesterday. States he sat in the waiting room since 0300, has not eaten and also missed dialysis.   Review of Systems  Positive: Wound, blurred vision Negative: fever  Physical Exam  BP (!) 176/117 (BP Location: Right Arm)   Pulse 87   Temp 98.6 F (37 C) (Oral)   Resp 18   SpO2 100%  Gen:   Awake, no distress   Resp:  Normal effort  MSK:   Moves extremities without difficulty  Other:  Foul odor noted right foot, 1+ pitting edema noted.  Medical Decision Making  Medically screening exam initiated at 6:00 PM.  Appropriate orders placed.  [name] was informed that the remainder of the evaluation will be completed by another provider, this initial triage assessment does not replace that evaluation, and the importance of remaining in the ED until their evaluation is complete.  Patient reports he was at 3 AM , States that he fell asleep while in the waiting room.  Prior history of diabetes, on dialysis but missed dialysis today but was last dialyzed on Friday.  No fever, wound noted to the right foot with active drainage.  1+ pitting edema.  Labs along with blood cultures have been ordered.   [name], PA-C 11/24/20 1806    Valarie Merino, MD 11/25/20 1515

## 2020-11-24 NOTE — H&P (Signed)
[name] TDV:[id number] DOB: 1986-07-02 DOA: 11/24/2020     PCP: Gildardo Pounds, NP   Outpatient Specialists:  NONE   Patient arrived to ER on 11/24/20 at 1559 Referred by Attending Valarie Merino, MD   Patient coming from: home Lives alone,        Chief Complaint:   Chief Complaint  Patient presents with  . Blurred Vision    HPI: [name] is a 35 y.o. male with medical history significant of ESRD HD on Monday Wednesday Friday, HLD, DM2, Anemia of chronic disease,     Presented with blurry vision that started for the past 4 days ago. Last hemodialysis was on Friday did not have 1 done today because he was sitting in the waiting room since 3 AM and fell asleep so he did not wake up when his name was called.Marland Kitchen Has been off of his blood pressure medications for months. Also reports he had injured his toe now complaining of some drainage since yesterday. Patient reports that he ran out of his medications and has not been taking them he did get finally his medications refilled but has not taken them because when he did try it made him nauseous so he does not want to take it anymore He is not on any insulin either anymore In January patient progressed from CKD to needing hemodialysis.  History of noncompliance and not following.  Was discharged on the second for February with plan to continue hemodialysis Monday Wednesday Friday and continue oral torsemide He has significant hypertension is poorly controlled for which he is supposed to be on Coreg losartan He could not tolerate in the past amlodipine or hydralazine. Longstanding history of anemia secondary to chronic kidney disease with low iron have had iron infusion by nephrology in the past hemoglobin dropped as low as 6.8 needing 1 unit blood transfusion 8 Infectious risk factors:  Reports chills      Initial COVID TEST  NEGATIVE   Lab Results  Component Value Date   SARSCOV2NAA NEGATIVE 11/24/2020    [place] NEGATIVE 08/06/2020   [place] NEGATIVE 05/07/2020   [place] NEGATIVE 02/25/2020     Regarding pertinent Chronic problems:      Hyperlipidemia -supposed to be on Lipitor  Lipid Panel     Component Value Date/Time   CHOL 146 09/16/2020 1356   TRIG 82 09/16/2020 1356   HDL 56 09/16/2020 1356   CHOLHDL 2.6 09/16/2020 1356   CHOLHDL 6.4 12/02/2019 1311   VLDL 22 12/02/2019 1311   LDLCALC 74 09/16/2020 1356   LABVLDL 16 09/16/2020 1356     HTN not compliant Supposed to be on Coreg,     DM 2 -  Lab Results  Component Value Date   HGBA1C 6.0 (H) 08/07/2020  Supposed to be on insulin not taking     ESRD on HD M,W,F  Lab Results  Component Value Date   CREATININE 12.60 (H) 11/24/2020   CREATININE 6.09 (H) 09/16/2020   CREATININE 8.89 (H) 08/11/2020       Chronic anemia - baseline hg Hemoglobin & Hematocrit  Recent Labs    08/11/20 0449 09/16/20 1356 11/24/20 1807  HGB 7.8* 8.1* 9.5*     While in ER: Noted to have potassium up to 6.3 Nephrology has been consulted Plan to dialyze tomorrow patient did become transiently hypoxic to high 80s Chest x-ray showing mild interstitial edema we will continue 2 L of oxygen and plan to hemodialyzed in a.m.  ED Triage Vitals  Enc Vitals Group     BP 11/24/20 1607 (!) 176/117     Pulse Rate 11/24/20 1607 87     Resp 11/24/20 1607 18     Temp 11/24/20 1607 98.6 F (37 C)     Temp Source 11/24/20 1607 Oral     SpO2 11/24/20 1607 100 %     Weight --      Height --      Head Circumference --      Peak Flow --      Pain Score 11/24/20 1756 0     Pain Loc --      Pain Edu? --      Excl. in [place]? --   TMAX(24)@     _________________________________________ Significant initial  Findings: Abnormal Labs Reviewed  CBC WITH DIFFERENTIAL/PLATELET - Abnormal; Notable for the following components:      Result Value   RBC 3.54 (*)    Hemoglobin 9.5 (*)    HCT 30.9 (*)    RDW 21.2 (*)    All other components  within normal limits  COMPREHENSIVE METABOLIC PANEL - Abnormal; Notable for the following components:   Potassium 6.3 (*)    Glucose, Bld 120 (*)    BUN 84 (*)    Creatinine, Ser 12.60 (*)    Calcium 8.4 (*)    Albumin 3.2 (*)    ALT 58 (*)    Alkaline Phosphatase 243 (*)    Total Bilirubin 1.3 (*)    GFR, Estimated 5 (*)    All other components within normal limits  CBG MONITORING, ED - Abnormal; Notable for the following components:   Glucose-Capillary 121 (*)    All other components within normal limits   ____________________________________________ Ordered   CXR -mild interstitial edema   ECG: Ordered Personally reviewed by me showing: HR : 90 Rhythm:  NSR,   no evidence of ischemic changes QTC 467 _______________  The recent clinical data is shown below. Vitals:   11/24/20 2015 11/24/20 2022 11/24/20 2045 11/24/20 2049  BP: (!) 183/118  (!) 184/116   Pulse: 91 89 90 90  Resp: (!) 8 12 (!) 21 16  Temp:      TempSrc:      SpO2: 91% 90% 95% 91%   WBC      Component Value Date/Time   WBC 6.7 11/24/2020 1807   LYMPHSABS 0.8 11/24/2020 1807   MONOABS 0.5 11/24/2020 1807   EOSABS 0.1 11/24/2020 1807   BASOSABS 0.1 11/24/2020 1807   Lactic Acid, Venous    Component Value Date/Time   LATICACIDVEN 0.8 11/24/2020 1852        UA not ordered     Results for orders placed or performed during the hospital encounter of 11/24/20  Resp Panel by RT-PCR (Flu A&B, Covid) Nasopharyngeal Swab     Status: None   Collection Time: 11/24/20  7:41 PM   Specimen: Nasopharyngeal Swab; Nasopharyngeal(NP) swabs in vial transport medium  Result Value Ref Range Status   SARS Coronavirus 2 by RT PCR NEGATIVE NEGATIVE Final         Influenza A by PCR NEGATIVE NEGATIVE Final   Influenza B by PCR NEGATIVE NEGATIVE Final          _______________________________________________________ ER Provider Called:  Nephrology   Dr. Hollie Salk They Recommend admit to medicine  Order lokelma Will  see in AM    _______________________________________________ Hospitalist was called for admission for hyperkalemia  The following  Work up has been ordered so far:  Orders Placed This Encounter  Procedures  . Blood culture (routine x 2)  . Resp Panel by RT-PCR (Flu A&B, Covid) Nasopharyngeal Swab  . DG Foot Complete Right  . DG Chest Port 1 View  . CBC with Differential  . Comprehensive metabolic panel  . Lactic acid, plasma  . Place Patient on a Cardiac Monitor  . Initiate Carrier Fluid Protocol  . Consult to nephrology  Missed dialysis - K 6.3  . Consult to hospitalist  Missed dialysis, hyperkalemia, right foot infection  . POC CBG, ED  . ED EKG  . EKG 12-Lead     Following Medications were ordered in ER: Medications  piperacillin-tazobactam (ZOSYN) IVPB 3.375 g (3.375 g Intravenous New Bag/Given 11/24/20 2041)  sodium zirconium cyclosilicate (LOKELMA) packet 10 g (has no administration in time range)        Consult Orders  (From admission, onward)         Start     Ordered   11/24/20 2048  Consult to hospitalist  Missed dialysis, hyperkalemia, right foot infection Paged by Jerene Pitch  Once       Comments: Missed dialysis, hyperkalemia, right foot infection  Provider:  (Not yet assigned)  Question Answer Comment  Place call to: [facility]   Reason for Consult Admit      11/24/20 2048            OTHER Significant initial  Findings:  labs showing:    Recent Labs  Lab 11/24/20 1807  NA 138  K 6.3*  CO2 25  GLUCOSE 120*  BUN 84*  CREATININE 12.60*  CALCIUM 8.4*    Cr    Up from baseline see below Lab Results  Component Value Date   CREATININE 12.60 (H) 11/24/2020   CREATININE 6.09 (H) 09/16/2020   CREATININE 8.89 (H) 08/11/2020    Recent Labs  Lab 11/24/20 1807  AST 19  ALT 58*  ALKPHOS 243*  BILITOT 1.3*  PROT 7.6  ALBUMIN 3.2*   Lab Results  Component Value Date   CALCIUM 8.4 (L) 11/24/2020   PHOS 7.0 (H) 08/07/2020    Plt: Lab Results  Component Value Date   PLT 352 11/24/2020       Venous  Blood Gas result:  pH 7.395  PCO2 42.7    ABG    Component Value Date/Time   HCO3 25.1 (H) 02/17/2009 0736   TCO2 26 02/17/2009 0736   O2SAT 83.0 02/17/2009 0736      Recent Labs  Lab 11/24/20 1807  WBC 6.7  NEUTROABS 5.2  HGB 9.5*  HCT 30.9*  MCV 87.3  PLT 352    HG/HCT * stable,  Down *Up from baseline see below    Component Value Date/Time   HGB 9.5 (L) 11/24/2020 1807   HGB 8.1 (L) 09/16/2020 1356   HCT 30.9 (L) 11/24/2020 1807   HCT 26.5 (L) 09/16/2020 1356   MCV 87.3 11/24/2020 1807   MCV 86 09/16/2020 1356    No results for input(s): LIPASE, AMYLASE in the last 168 hours. No results for input(s): AMMONIA in the last 168 hours.   Cardiac Panel (last 3 results) No results for input(s): CKTOTAL, CKMB, TROPONINI, RELINDX in the last 72 hours.   BNP (last 3 results) Recent Labs    02/25/20 1127 03/10/20 1410 08/06/20 2012  BNP 694.9* 617.0* 2,067.3*     DM  labs:  HbA1C: Recent Labs    02/26/20 1504  05/07/20 2238 08/07/20 0258  HGBA1C 6.0* 6.1* 6.0*     CBG (last 3)  Recent Labs    11/24/20 1807  GLUCAP 121*         Cultures:    Component Value Date/Time   SDES ABSCESS FINGER LEFT 07/16/2014 2000   SDES ABSCESS FINGER LEFT 07/16/2014 2000   SPECREQUEST NONE 07/16/2014 2000   [place] NONE 07/16/2014 2000   CULT  07/16/2014 2000    NO ANAEROBES ISOLATED Performed at [place]  07/16/2014 2000    MODERATE STREPTOCOCCUS GROUP F Performed at [facility] 07/22/2014 FINAL 07/16/2014 2000   REPTSTATUS 07/19/2014 FINAL 07/16/2014 2000     Radiological Exams on Admission: DG Chest Port 1 View  Result Date: 11/24/2020 CLINICAL DATA:  Shortness of breath. EXAM: PORTABLE CHEST 1 VIEW COMPARISON:  08/09/2020 FINDINGS: There is a right chest wall dual lumen catheter with tip at the cavoatrial junction. Stable cardiac  enlargement. No pleural effusion or edema. No airspace densities identified. Visualized osseous structures are unremarkable. IMPRESSION: 1. Cardiac enlargement 2. No acute abnormality. Electronically Signed   By: Kerby Moors M.D.   On: 11/24/2020 20:07   DG Foot Complete Right  Result Date: 11/24/2020 CLINICAL DATA:  Recent toe injury. EXAM: RIGHT FOOT COMPLETE - 3+ VIEW COMPARISON:  None. FINDINGS: Remote fracture involving the distal aspect of the fifth metatarsal bone identified. There is mild diffuse soft tissue swelling. No underlying acute or dislocation fracture. No focal bone erosions. Vascular calcifications noted. IMPRESSION: 1. No acute osseous findings. 2. Soft tissue swelling 3. Remote fracture involving the distal aspect of the fifth metatarsal bone. Electronically Signed   By: Kerby Moors M.D.   On: 11/24/2020 19:13   _______________________________________________________________________________________________________ Latest  Blood pressure (!) 184/116, pulse 90, temperature 98.2 F (36.8 C), temperature source Oral, resp. rate 16, SpO2 91 %.   Review of Systems:    Pertinent positives include: chills, fatigue,  Constitutional:  No weight loss, night sweats, Fevers,  weight loss  HEENT:  No headaches, Difficulty swallowing,Tooth/dental problems,Sore throat,  No sneezing, itching, ear ache, nasal congestion, post nasal drip,  Cardio-vascular:  No chest pain, Orthopnea, PND, anasarca, dizziness, palpitations.no Bilateral lower extremity swelling  GI:  No heartburn, indigestion, abdominal pain, nausea, vomiting, diarrhea, change in bowel habits, loss of appetite, melena, blood in stool, hematemesis Resp:  no shortness of breath at rest. No dyspnea on exertion, No excess mucus, no productive cough, No non-productive cough, No coughing up of blood.No change in color of mucus.No wheezing. Skin:  no rash or lesions. No jaundice GU:  no dysuria, change in color of urine, no  urgency or frequency. No straining to urinate.  No flank pain.  Musculoskeletal:  No joint pain or no joint swelling. No decreased range of motion. No back pain.  Psych:  No change in mood or affect. No depression or anxiety. No memory loss.  Neuro: no localizing neurological complaints, no tingling, no weakness, no double vision, no gait abnormality, no slurred speech, no confusion  All systems reviewed and apart from [place] all are negative _______________________________________________________________________________________________ Past Medical History:   Past Medical History:  Diagnosis Date  . Diabetes mellitus   . ESRD on hemodialysis ([place])   . High cholesterol   . Hypertension        Past Surgical History:  Procedure Laterality Date  . AV FISTULA PLACEMENT Left 08/11/2020   Procedure: LEFT UPPER EXTREMITY ARTERIOVENOUS (AV)  FISTULA CREATION;  Surgeon: Cherre Robins, MD;  Location: [facility];  Service: Vascular;  Laterality: Left;  . FRACTURE SURGERY    . I & D EXTREMITY Left 07/16/2014   Procedure: IRRIGATION AND DEBRIDEMENT EXTREMITY/LEFT INDEX FINGER;  Surgeon: Leanora Cover, MD;  Location: [facility];  Service: Orthopedics;  Laterality: Left;  . IR PERC TUN PERIT CATH WO PORT S&I [facility]  08/07/2020  . IR US GUIDE VASC ACCESS RIGHT  08/07/2020    Social History:  Ambulatory  Independently     reports that he has never smoked. He has never used smokeless tobacco. He reports previous alcohol use. He reports that he does not use drugs.   Family History:   Family History  Problem Relation Age of Onset  . Cancer Mother   . Stroke Father   . Diabetes Father   . Hypertension Father    ______________________________________________________________________________________________ Allergies: Allergies  Allergen Reactions  . Amlodipine Nausea And Vomiting and Other (See Comments)    Patient was taking Amlodipine and Hydralazine at the same time, so the reactions came from one of  the 2: Lethargy and an all-over feeling of NOT feeling well (also)  . Hydralazine Nausea And Vomiting and Other (See Comments)    Patient was taking Hydralazine AND Amlodipine at the same time, so the reactions came from one of the 2: Lethargy and an all-over feeling of NOT feeling well (also)     Prior to Admission medications   Medication Sig Start Date End Date Taking? Authorizing Provider  atorvastatin (LIPITOR) 40 MG tablet Take 1 tablet (40 mg total) by mouth daily. 09/06/20   Gildardo Pounds, NP  atorvastatin (LIPITOR) 40 MG tablet TAKE 1 TABLET (40 MG TOTAL) BY MOUTH DAILY. 09/06/20 09/06/21  Gildardo Pounds, NP  Blood Glucose Monitoring Suppl (TRUE METRIX METER) w/Device KIT Use as instructed. Check blood glucose level by fingerstick once per day. 09/06/20   Gildardo Pounds, NP  carvedilol (COREG) 6.25 MG tablet TAKE 1 TABLET (6.25 MG TOTAL) BY MOUTH TWO TIMES DAILY WITH A MEAL. 08/13/20 08/13/21  Antonieta Pert, MD  glucose blood (TRUE METRIX BLOOD GLUCOSE TEST) test strip Use as instructed. Check blood glucose level by fingerstick once per day. 09/06/20   Gildardo Pounds, NP  glucose blood test strip USE AS INSTRUCTED. CHECK BLOOD GLUCOSE LEVEL BY FINGERSTICK ONCE PER DAY. 09/06/20 09/06/21  Gildardo Pounds, NP  imipramine (TOFRANIL) 25 MG tablet Take 1 tablet (25 mg total) by mouth at bedtime. FOR DEPRESSION 09/06/20   Gildardo Pounds, NP  imipramine (TOFRANIL) 25 MG tablet TAKE 1 TABLET (25 MG TOTAL) BY MOUTH AT BEDTIME. FOR DEPRESSION 09/06/20 09/06/21  Gildardo Pounds, NP  insulin detemir (LEVEMIR) 100 UNIT/ML injection Inject 0.15 mLs (15 Units total) into the skin daily. Patient taking differently: Inject 10 Units into the skin daily. 05/10/20   Amin, Jeanella Flattery, MD  losartan (COZAAR) 50 MG tablet TAKE 1 TABLET (50 MG TOTAL) BY MOUTH DAILY. 08/13/20 08/13/21  Antonieta Pert, MD  sevelamer carbonate (RENVELA) 800 MG tablet TAKE 1 TABLET (800 MG TOTAL) BY MOUTH THREE TIMES DAILY WITH MEALS. 08/13/20  08/13/21  Antonieta Pert, MD  sevelamer carbonate (RENVELA) 800 MG tablet Take 2 tablets (1,600 mg total) by mouth 3 (three) times daily with meals 10/20/20 12/17/20    torsemide (DEMADEX) 20 MG tablet TAKE 2 TABLETS (40 MG) DAILY ON NON HEMODIALYSIS DAYS 08/13/20 08/13/21  Antonieta Pert, MD  TRUEplus Lancets 28G MISC Use  as instructed. Check blood glucose level by fingerstick once per day. 09/06/20   Gildardo Pounds, NP  TRUEplus Lancets 28G MISC USE AS INSTRUCTED. CHECK BLOOD GLUCOSE LEVEL BY FINGERSTICK ONCE PER DAY. 09/06/20 09/06/21  Gildardo Pounds, NP    ___________________________________________________________________________________________________ Physical Exam: Vitals with BMI 11/24/2020 11/24/2020 11/24/2020  Height - - -  Weight - - -  BMI - - -  Systolic - 568 -  Diastolic - 127 -  Pulse 90 90 89    1. General:  in No Acute distress    Chronically ill -appearing 2. Psychological: Alert and  Oriented 3. Head/ENT:   Moist  Mucous Membranes                          Head Non traumatic, neck supple                            Poor Dentition 4. SKIN:  decreased Skin turgor,  Skin clean Dry and intact no rash draining ulcer on the bottom of right foot 5. Heart: Regular rate and rhythm no  Murmur, no Rub or gallop 6. Lungs:   no wheezes or crackles   7. Abdomen: Soft,  non-tender, Non distended bowel sounds present 8. Lower extremities: no clubbing, cyanosis, no edema 9. Neurologically Grossly intact, moving all 4 extremities equally   10. MSK: Normal range of motion    Chart has been reviewed  ______________________________________________________________________________________________  Assessment/Plan  35 y.o. male with medical history significant of ESRD HD on Monday Wednesday Friday, HLD, DM2, Anemia of chronic disease,   Admitted for right foot ulcer, hyperkalemia and fluid overload in setting of not getting hemodialysis  Present on Admission: . Hyperkalemia ER provider discussed  with nephrology who recommended Lokelma and hemodialysis in the morning. Has been also getting insulin Since patient still makes some urine we will try dose of Lasix as well No EKG changes at this time Appreciate nephrology consult and recommendations Repeat K up from 6.5 will repeat lokelma  . Essential hypertension -start home medications as able Allow some question for HD in the morning  Acute respiratory failure hypoxia patient needing 2 L of O2.  Chest x-ray shown mild pulmonary interstitial edema.  Plan for hemodialysis in a.m. continue oxygen administration As patient has been in the past making urine we will try dose of Lasix  . Anemia of chronic kidney failure stable no indication for blood transfusion at this Time   . 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  DVT prophylaxis:  SCD        Code Status:    Code Status: Prior FULL CODE  as per patient  I had personally discussed CODE STATUS with patient     Family Communication:   Family not at  Bedside   Disposition Plan:     To home once workup is complete and patient is stable   Following barriers for discharge:  Electrolytes corrected                                                             able to transition to PO antibiotics                             Will need to be able to tolerate PO                            Will likely need home health                            Will need consultants to evaluate patient prior to discharge                      Would benefit from PT/OT eval prior to DC  Ordered                                     Wound care  consulted                                      Consults called: Nephrology aware plan to see in a.m. and dialyze  Admission status:  ED Disposition    ED Disposition Condition [place]: [place] [[id number]]  Level of Care: Progressive [102]  Admit to Progressive based on following criteria: CARDIOVASCULAR & THORACIC of moderate stability with acute coronary syndrome symptoms/low risk myocardial infarction/hypertensive urgency/arrhythmias/heart failure potentially compromising stability and stable post cardiovascular intervention patients.  Admit to Progressive based on following criteria: NEPHROLOGY stable condition requiring close monitoring for AKI, requiring Hemodialysis or Peritoneal Dialysis either from expected electrolyte imbalance, acidosis, or fluid overload that can be managed by NIPPV or high flow oxygen.  May admit patient to [facility] or [facility] if equivalent level of care is available:: No  Covid Evaluation: Confirmed COVID Negative  Diagnosis: Hyperkalemia [[id number]]  Admitting Physician: Toy Baker [[id number]]  Attending Physician: Toy Baker [[id number]]  Estimated length of stay: past midnight tomorrow  Certification:: I certify this patient will need inpatient services for at least 2 midnights         inpatient     I Expect 2 midnight stay secondary to severity of patient's current illness need for inpatient interventions justified by the following:     Severe lab/radiological/exam abnormalities including:   Evidence of fluid overload and hypercalcemia and extensive comorbidities including:    DM2   ESD  .      That are currently affecting medical management.   I expect  patient to be hospitalized for 2 midnights requiring inpatient medical care.  Patient is at high risk for adverse outcome (such as loss of life or disability) if not treated.  Indication for inpatient stay as follows:      Need for operative/procedural  intervention New  or worsening hypoxia  Need for IV antibiotics,  IV diuretics    Level of  care   Progressive tele indefinitely please discontinue once patient no longer qualifies COVID-19 Labs    Lab Results  Component Value Date   [place] NEGATIVE 11/24/2020     Precautions: admitted as  Covid Negative     PPE: Used by the provider:   N95   eye Goggles,  Gloves     Evanna Washinton 11/25/2020, 1:33 AM    [facility]     after 2 AM please page floor coverage PA If 7AM-7PM, please contact the day team taking care of the patient using [email or web address]   Patient was evaluated in the context of the global COVID-19 pandemic, which necessitated consideration that the patient might be at risk for infection with the SARS-CoV-2 virus that causes COVID-19. Institutional protocols and algorithms that pertain to the evaluation of patients at risk for COVID-19 are in a state of rapid change based on information released by regulatory bodies including the CDC and federal and state organizations. These policies and algorithms were followed during the patient's care.

## 2020-11-24 NOTE — ED Provider Notes (Signed)
[facility] EMERGENCY DEPARTMENT Provider Note   CSN: [id number] Arrival date & time: 11/24/20  1559     History Chief Complaint  Patient presents with  . Blurred Vision    HOBY KAWAI is a 35 y.o. male.  35 year old male with prior medical history as detailed below presents for evaluation.  Patient reports that his right great toe and foot have become swollen and painful after he picked off some skin near the big toe.  This occurred yesterday.  Apparently, the patient has been present in the ED since about 3 AM when he arrived initially.  He reports that he then went to sleep in the waiting room and did not hear his name being called until this afternoon.  He missed dialysis because he was due for today --instead he was apparently asleep in the ED waiting room.  Patient denies shortness of breath.  He denies fever.  He reports increased swelling to his right foot after he removed the dead skin from near his Great Toe.  The history is provided by the patient and medical records.  Illness Location:  Missed dialysis, right great toe and right foot infection Severity:  Moderate Onset quality:  Gradual Duration:  2 days Timing:  Constant Progression:  Worsening Chronicity:  New      Past Medical History:  Diagnosis Date  . Diabetes mellitus   . ESRD on hemodialysis ([place])   . High cholesterol   . Hypertension     Patient Active Problem List   Diagnosis Date Noted  . Hypervolemia associated with renal insufficiency 08/07/2020  . Uremia 08/07/2020  . Metabolic acidosis 39/76/7341  . Hyperkalemia 08/07/2020  . Nausea and vomiting 05/07/2020  . Anemia of chronic kidney failure 05/07/2020  . Dehydration   . Hypertensive urgency 02/25/2020  . Acute renal failure superimposed on stage 4 chronic kidney disease ([place]) 12/02/2019  . Anemia 12/02/2019  . Essential hypertension 12/02/2019  . SOB (shortness of breath) 12/02/2019  . DM2 (diabetes mellitus,  type 2) ([place]) 12/02/2019  . Diarrhea 03/23/2017  . Early satiety 03/23/2017  . Generalized abdominal pain 03/23/2017    Past Surgical History:  Procedure Laterality Date  . AV FISTULA PLACEMENT Left 08/11/2020   Procedure: LEFT UPPER EXTREMITY ARTERIOVENOUS (AV) FISTULA CREATION;  Surgeon: Cherre Robins, MD;  Location: [facility];  Service: Vascular;  Laterality: Left;  . FRACTURE SURGERY    . I & D EXTREMITY Left 07/16/2014   Procedure: IRRIGATION AND DEBRIDEMENT EXTREMITY/LEFT INDEX FINGER;  Surgeon: Leanora Cover, MD;  Location: [facility];  Service: Orthopedics;  Laterality: Left;  . IR PERC TUN PERIT CATH WO PORT S&I Dartha Lodge  08/07/2020  . IR US GUIDE VASC ACCESS RIGHT  08/07/2020       Family History  Problem Relation Age of Onset  . Cancer Mother   . Stroke Father   . Diabetes Father   . Hypertension Father     Social History   Tobacco Use  . Smoking status: Never Smoker  . Smokeless tobacco: Never Used  Vaping Use  . Vaping Use: Never used  Substance Use Topics  . Alcohol use: Not Currently  . Drug use: No    Home Medications Prior to Admission medications   Medication Sig Start Date End Date Taking? Authorizing Provider  atorvastatin (LIPITOR) 40 MG tablet Take 1 tablet (40 mg total) by mouth daily. 09/06/20   Gildardo Pounds, NP  atorvastatin (LIPITOR) 40 MG tablet TAKE 1 TABLET (  40 MG TOTAL) BY MOUTH DAILY. 09/06/20 09/06/21  Gildardo Pounds, NP  Blood Glucose Monitoring Suppl (TRUE METRIX METER) w/Device KIT Use as instructed. Check blood glucose level by fingerstick once per day. 09/06/20   Gildardo Pounds, NP  carvedilol (COREG) 6.25 MG tablet TAKE 1 TABLET (6.25 MG TOTAL) BY MOUTH TWO TIMES DAILY WITH A MEAL. 08/13/20 08/13/21  Antonieta Pert, MD  glucose blood (TRUE METRIX BLOOD GLUCOSE TEST) test strip Use as instructed. Check blood glucose level by fingerstick once per day. 09/06/20   Gildardo Pounds, NP  glucose blood test strip USE AS INSTRUCTED. CHECK BLOOD GLUCOSE LEVEL BY  FINGERSTICK ONCE PER DAY. 09/06/20 09/06/21  Gildardo Pounds, NP  imipramine (TOFRANIL) 25 MG tablet Take 1 tablet (25 mg total) by mouth at bedtime. FOR DEPRESSION 09/06/20   Gildardo Pounds, NP  imipramine (TOFRANIL) 25 MG tablet TAKE 1 TABLET (25 MG TOTAL) BY MOUTH AT BEDTIME. FOR DEPRESSION 09/06/20 09/06/21  Gildardo Pounds, NP  insulin detemir (LEVEMIR) 100 UNIT/ML injection Inject 0.15 mLs (15 Units total) into the skin daily. Patient taking differently: Inject 10 Units into the skin daily. 05/10/20   Amin, Jeanella Flattery, MD  losartan (COZAAR) 50 MG tablet TAKE 1 TABLET (50 MG TOTAL) BY MOUTH DAILY. 08/13/20 08/13/21  Antonieta Pert, MD  sevelamer carbonate (RENVELA) 800 MG tablet TAKE 1 TABLET (800 MG TOTAL) BY MOUTH THREE TIMES DAILY WITH MEALS. 08/13/20 08/13/21  Antonieta Pert, MD  sevelamer carbonate (RENVELA) 800 MG tablet Take 2 tablets (1,600 mg total) by mouth 3 (three) times daily with meals 10/20/20 12/17/20    torsemide (DEMADEX) 20 MG tablet TAKE 2 TABLETS (40 MG) DAILY ON NON HEMODIALYSIS DAYS 08/13/20 08/13/21  Antonieta Pert, MD  TRUEplus Lancets 28G MISC Use as instructed. Check blood glucose level by fingerstick once per day. 09/06/20   Gildardo Pounds, NP  TRUEplus Lancets 28G MISC USE AS INSTRUCTED. CHECK BLOOD GLUCOSE LEVEL BY FINGERSTICK ONCE PER DAY. 09/06/20 09/06/21  Gildardo Pounds, NP    Allergies    Amlodipine and Hydralazine  Review of Systems   Review of Systems  All other systems reviewed and are negative.   Physical Exam Updated Vital Signs BP (!) 184/116   Pulse 90   Temp 98.2 F (36.8 C) (Oral)   Resp 16   SpO2 91%   Physical Exam Vitals and nursing note reviewed.  Constitutional:      General: He is not in acute distress.    Appearance: Normal appearance. He is well-developed.  HENT:     Head: Normocephalic and atraumatic.  Eyes:     Conjunctiva/sclera: Conjunctivae normal.     Pupils: Pupils are equal, round, and reactive to light.  Cardiovascular:     Rate and  Rhythm: Normal rate and regular rhythm.     Heart sounds: Normal heart sounds.  Pulmonary:     Effort: Pulmonary effort is normal. No respiratory distress.     Breath sounds: Normal breath sounds.  Abdominal:     General: There is no distension.     Palpations: Abdomen is soft.     Tenderness: There is no abdominal tenderness.  Musculoskeletal:        General: No deformity. Normal range of motion.     Cervical back: Normal range of motion and neck supple.  Skin:    General: Skin is warm and dry.     Comments: Moderate edema of the right great toe and right foot  with wound overlying the medial aspect of the right great toe.  Wound itself is foul-smelling with scant purulent drainage and minimal bloody drainage.  Neurological:     Mental Status: He is alert and oriented to person, place, and time.     ED Results / Procedures / Treatments   Labs (all labs ordered are listed, but only abnormal results are displayed) Labs Reviewed  CBC WITH DIFFERENTIAL/PLATELET - Abnormal; Notable for the following components:      Result Value   RBC 3.54 (*)    Hemoglobin 9.5 (*)    HCT 30.9 (*)    RDW 21.2 (*)    All other components within normal limits  COMPREHENSIVE METABOLIC PANEL - Abnormal; Notable for the following components:   Potassium 6.3 (*)    Glucose, Bld 120 (*)    BUN 84 (*)    Creatinine, Ser 12.60 (*)    Calcium 8.4 (*)    Albumin 3.2 (*)    ALT 58 (*)    Alkaline Phosphatase 243 (*)    Total Bilirubin 1.3 (*)    GFR, Estimated 5 (*)    All other components within normal limits  CBG MONITORING, ED - Abnormal; Notable for the following components:   Glucose-Capillary 121 (*)    All other components within normal limits  RESP PANEL BY RT-PCR (FLU A&B, COVID) ARPGX2  CULTURE, BLOOD (ROUTINE X 2)  CULTURE, BLOOD (ROUTINE X 2)  LACTIC ACID, PLASMA  LACTIC ACID, PLASMA  URINALYSIS, ROUTINE W REFLEX MICROSCOPIC  RAPID URINE DRUG SCREEN, HOSP PERFORMED    EKG EKG  Interpretation  Date/Time:  Monday Nov 24 2020 19:23:13 EDT Ventricular Rate:  90 PR Interval:  184 QRS Duration: 90 QT Interval:  381 QTC Calculation: 467 R Axis:   -69 Text Interpretation: Sinus tachycardia Multiform ventricular premature complexes Probable left atrial enlargement Left anterior fascicular block Probable left ventricular hypertrophy Anterior Q waves, possibly due to LVH Confirmed by Dene Gentry [phone]) on 11/24/2020 8:44:45 PM   Radiology DG Chest Port 1 View  Result Date: 11/24/2020 CLINICAL DATA:  Shortness of breath. EXAM: PORTABLE CHEST 1 VIEW COMPARISON:  08/09/2020 FINDINGS: There is a right chest wall dual lumen catheter with tip at the cavoatrial junction. Stable cardiac enlargement. No pleural effusion or edema. No airspace densities identified. Visualized osseous structures are unremarkable. IMPRESSION: 1. Cardiac enlargement 2. No acute abnormality. Electronically Signed   By: Kerby Moors M.D.   On: 11/24/2020 20:07   DG Foot Complete Right  Result Date: 11/24/2020 CLINICAL DATA:  Recent toe injury. EXAM: RIGHT FOOT COMPLETE - 3+ VIEW COMPARISON:  None. FINDINGS: Remote fracture involving the distal aspect of the fifth metatarsal bone identified. There is mild diffuse soft tissue swelling. No underlying acute or dislocation fracture. No focal bone erosions. Vascular calcifications noted. IMPRESSION: 1. No acute osseous findings. 2. Soft tissue swelling 3. Remote fracture involving the distal aspect of the fifth metatarsal bone. Electronically Signed   By: Kerby Moors M.D.   On: 11/24/2020 19:13    Procedures Procedures   Medications Ordered in ED Medications  sodium zirconium cyclosilicate (LOKELMA) packet 10 g (has no administration in time range)  Chlorhexidine Gluconate Cloth 2 % PADS 6 each (has no administration in time range)  pentafluoroprop-tetrafluoroeth (GEBAUERS) aerosol 1 application (has no administration in time range)  lidocaine (PF)  (XYLOCAINE) 1 % injection 5 mL (has no administration in time range)  lidocaine-prilocaine (EMLA) cream 1 application (has no administration in time  range)  0.9 %  sodium chloride infusion (has no administration in time range)  0.9 %  sodium chloride infusion (has no administration in time range)  alteplase (CATHFLO ACTIVASE) injection 2 mg (has no administration in time range)  piperacillin-tazobactam (ZOSYN) IVPB 3.375 g (3.375 g Intravenous New Bag/Given 11/24/20 2041)    ED Course  I have reviewed the triage vital signs and the nursing notes.  Pertinent labs & imaging results that were available during my care of the patient were reviewed by me and considered in my medical decision making (see chart for details).    MDM Rules/Calculators/A&P                          MDM  MSE complete  OSBORN PULLIN was evaluated in Emergency Department on 11/24/2020 for the symptoms described in the history of present illness. He was evaluated in the context of the global COVID-19 pandemic, which necessitated consideration that the patient might be at risk for infection with the SARS-CoV-2 virus that causes COVID-19. Institutional protocols and algorithms that pertain to the evaluation of patients at risk for COVID-19 are in a state of rapid change based on information released by regulatory bodies including the CDC and federal and state organizations. These policies and algorithms were followed during the patient's care in the ED.  Patient presents with complaints of missed dialysis and likely infection of the right great toe.  Patient is noted to have hyperkalemia.  Dr. Hollie Salk of nephrology is aware of case.  She agrees with plan for inpatient dialysis and Lokelma at this time.  Hospitalist services aware of case and will evaluate for admission.  Broad-spectrum antibiotics initiated in the ED for treatment of suspected infection in the right foot.  Final Clinical Impression(s) / ED  Diagnoses Final diagnoses:  ESRD (end stage renal disease) ([place])  Hyperkalemia  Foot infection    Rx / DC Orders ED Discharge Orders    None       Valarie Merino, MD 11/24/20 2119

## 2020-11-24 NOTE — ED Triage Notes (Addendum)
Pt reports recent toe injury that he was seen for. Woke up Friday morning and had blurred vision since then. Came in last night and states he has been skipped over in lobby since 0300 bc he fell asleep. No distress is noted at triage.dialysis pt, last treatment was Friday. Missed todays treatment since he was sleeping in lobby.

## 2020-11-24 NOTE — ED Notes (Signed)
Pt called multiple times with no answer, moving OTF.

## 2020-11-24 NOTE — ED Triage Notes (Signed)
Patient arrives with PTAR from home for blurry vision starting on Friday, hx of ESRD M/W/F, last HD Friday, also with DM 2 and HTN, states he has been off his HTN meds for several months

## 2020-11-24 NOTE — ED Notes (Signed)
Pt given [place] sandwich and cranberry juice (118 ml)

## 2020-11-24 NOTE — Progress Notes (Signed)
Pharmacy Antibiotic Note  [name] is a 35 y.o. male admitted on 11/24/2020 presenting with R-foot wound, concern for DFI.  Pharmacy has been consulted for vancomycin dosing.  ESRD-HD usually MWF, missed HD today Zosyn per MD  Plan: Vancomycin 2000 mg IV x 1, then 1000 mg qHD Monitor HD schedule, clinical progression and ability to narrow Vancomycin random level as needed     Temp (24hrs), Avg:98.7 F (37.1 C), Min:98.2 F (36.8 C), Max:99.2 F (37.3 C)  Recent Labs  Lab 11/24/20 1807 11/24/20 1852  WBC 6.7  --   CREATININE 12.60*  --   LATICACIDVEN  --  0.8    Estimated Creatinine Clearance: 9 mL/min (A) (by C-G formula based on SCr of 12.6 mg/dL (H)).    Allergies  Allergen Reactions  . Amlodipine Nausea And Vomiting and Other (See Comments)    Patient was taking Amlodipine and Hydralazine at the same time, so the reactions came from one of the 2: Lethargy and an all-over feeling of NOT feeling well (also)  . Hydralazine Nausea And Vomiting and Other (See Comments)    Patient was taking Hydralazine AND Amlodipine at the same time, so the reactions came from one of the 2: Lethargy and an all-over feeling of NOT feeling well (also)    [name], PharmD Clinical Pharmacist ED Pharmacist Phone # [phone] 11/24/2020 9:17 PM

## 2020-11-25 ENCOUNTER — Ambulatory Visit: Payer: Self-pay | Admitting: Nurse Practitioner

## 2020-11-25 ENCOUNTER — Inpatient Hospital Stay (HOSPITAL_COMMUNITY): Payer: Medicaid Other

## 2020-11-25 DIAGNOSIS — Z794 Long term (current) use of insulin: Secondary | ICD-10-CM

## 2020-11-25 DIAGNOSIS — E11621 Type 2 diabetes mellitus with foot ulcer: Secondary | ICD-10-CM

## 2020-11-25 DIAGNOSIS — L97509 Non-pressure chronic ulcer of other part of unspecified foot with unspecified severity: Secondary | ICD-10-CM

## 2020-11-25 DIAGNOSIS — J9601 Acute respiratory failure with hypoxia: Secondary | ICD-10-CM | POA: Diagnosis present

## 2020-11-25 LAB — RAPID URINE DRUG SCREEN, HOSP PERFORMED
Amphetamines: NOT DETECTED
Barbiturates: NOT DETECTED
Benzodiazepines: NOT DETECTED
Cocaine: NOT DETECTED
Opiates: NOT DETECTED
Tetrahydrocannabinol: NOT DETECTED

## 2020-11-25 LAB — URINALYSIS, ROUTINE W REFLEX MICROSCOPIC
Bacteria, UA: NONE SEEN
Bilirubin Urine: NEGATIVE
Glucose, UA: 50 mg/dL — AB
Ketones, ur: NEGATIVE mg/dL
Leukocytes,Ua: NEGATIVE
Nitrite: NEGATIVE
Protein, ur: >=300 mg/dL — AB
Specific Gravity, Urine: 1.016 (ref 1.005–1.030)
pH: 7 (ref 5.0–8.0)

## 2020-11-25 LAB — GLUCOSE, CAPILLARY
Glucose-Capillary: 184 mg/dL — ABNORMAL HIGH (ref 70–99)
Glucose-Capillary: 152 mg/dL — ABNORMAL HIGH (ref 70–99)
Glucose-Capillary: 155 mg/dL — ABNORMAL HIGH (ref 70–99)

## 2020-11-25 LAB — CBC WITH DIFFERENTIAL/PLATELET
Abs Immature Granulocytes: 0.03 10*3/uL (ref 0.00–0.07)
Basophils Absolute: 0.1 10*3/uL (ref 0.0–0.1)
Basophils Relative: 1 %
Eosinophils Absolute: 0.2 10*3/uL (ref 0.0–0.5)
Eosinophils Relative: 2 %
HCT: 31.9 % — ABNORMAL LOW (ref 39.0–52.0)
Hemoglobin: 9.9 g/dL — ABNORMAL LOW (ref 13.0–17.0)
Immature Granulocytes: 0 %
Lymphocytes Relative: 15 %
Lymphs Abs: 1.1 10*3/uL (ref 0.7–4.0)
MCH: 27.2 pg (ref 26.0–34.0)
MCHC: 31 g/dL (ref 30.0–36.0)
MCV: 87.6 fL (ref 80.0–100.0)
Monocytes Absolute: 0.5 10*3/uL (ref 0.1–1.0)
Monocytes Relative: 7 %
Neutro Abs: 5.4 10*3/uL (ref 1.7–7.7)
Neutrophils Relative %: 75 %
Platelets: 357 10*3/uL (ref 150–400)
RBC: 3.64 MIL/uL — ABNORMAL LOW (ref 4.22–5.81)
RDW: 21.1 % — ABNORMAL HIGH (ref 11.5–15.5)
WBC: 7.3 10*3/uL (ref 4.0–10.5)
nRBC: 0 % (ref 0.0–0.2)

## 2020-11-25 LAB — COMPREHENSIVE METABOLIC PANEL
ALT: 56 U/L — ABNORMAL HIGH (ref 0–44)
AST: 20 U/L (ref 15–41)
Albumin: 3.1 g/dL — ABNORMAL LOW (ref 3.5–5.0)
Alkaline Phosphatase: 250 U/L — ABNORMAL HIGH (ref 38–126)
Anion gap: 15 (ref 5–15)
BUN: 86 mg/dL — ABNORMAL HIGH (ref 6–20)
CO2: 24 mmol/L (ref 22–32)
Calcium: 8.1 mg/dL — ABNORMAL LOW (ref 8.9–10.3)
Chloride: 98 mmol/L (ref 98–111)
Creatinine, Ser: 13.35 mg/dL — ABNORMAL HIGH (ref 0.61–1.24)
GFR, Estimated: 4 mL/min — ABNORMAL LOW (ref >60)
Glucose, Bld: 120 mg/dL — ABNORMAL HIGH (ref 70–99)
Potassium: 5.8 mmol/L — ABNORMAL HIGH (ref 3.5–5.1)
Sodium: 137 mmol/L (ref 135–145)
Total Bilirubin: 1.6 mg/dL — ABNORMAL HIGH (ref 0.3–1.2)
Total Protein: 7.6 g/dL (ref 6.5–8.1)

## 2020-11-25 LAB — I-STAT VENOUS BLOOD GAS, ED
Acid-Base Excess: 1 mmol/L (ref 0.0–2.0)
Bicarbonate: 26.2 mmol/L (ref 20.0–28.0)
Calcium, Ion: 0.97 mmol/L — ABNORMAL LOW (ref 1.15–1.40)
HCT: 31 % — ABNORMAL LOW (ref 39.0–52.0)
Hemoglobin: 10.5 g/dL — ABNORMAL LOW (ref 13.0–17.0)
O2 Saturation: 79 %
Potassium: 6.6 mmol/L (ref 3.5–5.1)
Sodium: 137 mmol/L (ref 135–145)
TCO2: 27 mmol/L (ref 22–32)
pCO2, Ven: 42.7 mmHg — ABNORMAL LOW (ref 44.0–60.0)
pH, Ven: 7.395 (ref 7.250–7.430)
pO2, Ven: 44 mmHg (ref 32.0–45.0)

## 2020-11-25 LAB — HEMOGLOBIN A1C
Hgb A1c MFr Bld: 7.2 % — ABNORMAL HIGH (ref 4.8–5.6)
Mean Plasma Glucose: 159.94 mg/dL

## 2020-11-25 LAB — PREALBUMIN: Prealbumin: 25.1 mg/dL (ref 18–38)

## 2020-11-25 LAB — MAGNESIUM: Magnesium: 2.6 mg/dL — ABNORMAL HIGH (ref 1.7–2.4)

## 2020-11-25 LAB — BASIC METABOLIC PANEL
Anion gap: 15 (ref 5–15)
BUN: 86 mg/dL — ABNORMAL HIGH (ref 6–20)
CO2: 24 mmol/L (ref 22–32)
Calcium: 8.1 mg/dL — ABNORMAL LOW (ref 8.9–10.3)
Chloride: 97 mmol/L — ABNORMAL LOW (ref 98–111)
Creatinine, Ser: 13.26 mg/dL — ABNORMAL HIGH (ref 0.61–1.24)
GFR, Estimated: 5 mL/min — ABNORMAL LOW (ref >60)
Glucose, Bld: 156 mg/dL — ABNORMAL HIGH (ref 70–99)
Potassium: 6.5 mmol/L (ref 3.5–5.1)
Sodium: 136 mmol/L (ref 135–145)

## 2020-11-25 LAB — TSH: TSH: 1.364 u[IU]/mL (ref 0.350–4.500)

## 2020-11-25 LAB — MRSA PCR SCREENING: MRSA by PCR: NEGATIVE

## 2020-11-25 LAB — C-REACTIVE PROTEIN: CRP: <0.5 mg/dL (ref <1.0)

## 2020-11-25 LAB — PHOSPHORUS: Phosphorus: 7.5 mg/dL — ABNORMAL HIGH (ref 2.5–4.6)

## 2020-11-25 LAB — SEDIMENTATION RATE: Sed Rate: 60 mm/hr — ABNORMAL HIGH (ref 0–16)

## 2020-11-25 MED ORDER — CLONIDINE HCL 0.1 MG PO TABS
0.1000 mg | ORAL_TABLET | Freq: Once | ORAL | Status: AC
  Administered 2020-11-25: 0.1 mg via ORAL
  Filled 2020-11-25: qty 1

## 2020-11-25 MED ORDER — HEPARIN SODIUM (PORCINE) 5000 UNIT/ML IJ SOLN
5000.0000 [IU] | Freq: Three times a day (TID) | INTRAMUSCULAR | Status: DC
  Administered 2020-11-25 – 2020-11-27 (×7): 5000 [IU] via SUBCUTANEOUS
  Filled 2020-11-25 (×7): qty 1

## 2020-11-25 MED ORDER — INSULIN ASPART 100 UNIT/ML IJ SOLN
0.0000 [IU] | Freq: Three times a day (TID) | INTRAMUSCULAR | Status: DC
  Administered 2020-11-25 (×2): 1 [IU] via SUBCUTANEOUS
  Administered 2020-11-26 – 2020-11-27 (×3): 2 [IU] via SUBCUTANEOUS

## 2020-11-25 MED ORDER — FUROSEMIDE 10 MG/ML IJ SOLN
120.0000 mg | Freq: Once | INTRAVENOUS | Status: AC
  Administered 2020-11-25: 120 mg via INTRAVENOUS
  Filled 2020-11-25: qty 2

## 2020-11-25 MED ORDER — HEPARIN SODIUM (PORCINE) 1000 UNIT/ML IJ SOLN
INTRAMUSCULAR | Status: AC
  Administered 2020-11-25: 3800 [IU] via INTRAVENOUS
  Filled 2020-11-25: qty 4

## 2020-11-25 MED ORDER — LIVING WELL WITH DIABETES BOOK
Freq: Once | Status: DC
  Filled 2020-11-25: qty 1

## 2020-11-25 MED ORDER — TORSEMIDE 20 MG PO TABS
40.0000 mg | ORAL_TABLET | ORAL | Status: DC
  Filled 2020-11-25 (×2): qty 2

## 2020-11-25 MED ORDER — SODIUM ZIRCONIUM CYCLOSILICATE 10 G PO PACK
10.0000 g | PACK | Freq: Once | ORAL | Status: AC
  Administered 2020-11-25: 10 g via ORAL
  Filled 2020-11-25: qty 1

## 2020-11-25 MED ORDER — INSULIN ASPART 100 UNIT/ML IJ SOLN: 0.0000 [IU] | Freq: Every day | INTRAMUSCULAR | Status: DC

## 2020-11-25 MED ORDER — HEPARIN SODIUM (PORCINE) 1000 UNIT/ML IJ SOLN
1000.0000 [IU] | INTRAMUSCULAR | Status: DC
  Filled 2020-11-25 (×2): qty 1

## 2020-11-25 MED ORDER — HEPARIN SODIUM (PORCINE) 1000 UNIT/ML IJ SOLN: 1000.0000 [IU] | INTRAMUSCULAR | Status: DC | PRN

## 2020-11-25 MED ORDER — LOSARTAN POTASSIUM 50 MG PO TABS
50.0000 mg | ORAL_TABLET | Freq: Every day | ORAL | Status: DC
  Administered 2020-11-25 – 2020-11-27 (×3): 50 mg via ORAL
  Filled 2020-11-25 (×3): qty 1

## 2020-11-25 MED ORDER — CHLORHEXIDINE GLUCONATE CLOTH 2 % EX PADS: 6.0000 | MEDICATED_PAD | Freq: Every day | CUTANEOUS | Status: DC

## 2020-11-25 MED ORDER — VANCOMYCIN HCL 1000 MG/200ML IV SOLN
1000.0000 mg | Freq: Once | INTRAVENOUS | Status: AC
  Administered 2020-11-25: 1000 mg via INTRAVENOUS
  Filled 2020-11-25: qty 200

## 2020-11-25 MED ORDER — HYDROXYZINE HCL 25 MG PO TABS
25.0000 mg | ORAL_TABLET | Freq: Three times a day (TID) | ORAL | Status: DC | PRN
  Administered 2020-11-25: 25 mg via ORAL
  Filled 2020-11-25: qty 1

## 2020-11-25 NOTE — ED Notes (Signed)
Per Dr. Roel Cluck - pls let me or floor cover know if VBG is abnormal

## 2020-11-25 NOTE — Consult Note (Signed)
[facility] Renal Consultation Note    Indication for Consultation:  Management of ESRD/hemodialysis; anemia, hypertension/volume and secondary hyperparathyroidism  OEU:MPNTIRW, Vernia Buff, NP  HPI: [name] is a 35 y.o. male. ESRD on HD MWF at [facility].  Past medical history significant for DMT2, horseshoe kidney, HTN, and medical noncompliance.  Patient presented to the ED due to blurred vision and bleeding from his toe.  Reports he woke up Friday with blurred vision which has not improved over the last few days so he decided he needed to be seen. Denies pain, HA, fever and chills.  Blood pressure has been especially high recently because he ran out of blood pressure meds and has not been able to pick them up.  As for the toe, reports he "pulled some dead skin off of it and it started bleeding and would not stop".   Denies pain in foot/toe.  No CP, SOB, edema, orthopnea, abdominal pain, or n/v/d.  Of note patient is mostly compliant with prescribed dialysis regimen and has had his dry weight lowered 2kg over the last 2 weeks.  Missed HD yesterday due to being in the ED.  Unsure if he has lost weight but states it is a possibility. He also reports successful use LU AVF at dialysis for the last 2 weeks.   Pertinent findings on admission include hypertension with SBP>200, K 6.5, BUN 86, SCr 13.35, phos 7.5, sed rate 60, xray R foot with no acute findings, CXR this AM with trace b/l pleural effusions and mild interstitial edema.  Patient admitted for further evaluation and management.     Past Medical History:  Diagnosis Date  . Diabetes mellitus   . ESRD on hemodialysis ([place])   . High cholesterol   . Hypertension    Past Surgical History:  Procedure Laterality Date  . AV FISTULA PLACEMENT Left 08/11/2020   Procedure: LEFT UPPER EXTREMITY ARTERIOVENOUS (AV) FISTULA CREATION;  Surgeon: Cherre Robins, MD;  Location: [place];  Service: Vascular;  Laterality: Left;  .  FRACTURE SURGERY    . I & D EXTREMITY Left 07/16/2014   Procedure: IRRIGATION AND DEBRIDEMENT EXTREMITY/LEFT INDEX FINGER;  Surgeon: Leanora Cover, MD;  Location: [place];  Service: Orthopedics;  Laterality: Left;  . IR PERC TUN PERIT CATH WO PORT S&I Dartha Lodge  08/07/2020  . IR US GUIDE VASC ACCESS RIGHT  08/07/2020   Family History  Problem Relation Age of Onset  . Cancer Mother   . Stroke Father   . Diabetes Father   . Hypertension Father    Social History:  reports that he has never smoked. He has never used smokeless tobacco. He reports previous alcohol use. He reports that he does not use drugs. Allergies  Allergen Reactions  . Amlodipine Nausea And Vomiting and Other (See Comments)    Patient was taking Amlodipine and Hydralazine at the same time, so the reactions came from one of the 2: Lethargy and an all-over feeling of NOT feeling well (also)  . Hydralazine Nausea And Vomiting and Other (See Comments)    Patient was taking Hydralazine AND Amlodipine at the same time, so the reactions came from one of the 2: Lethargy and an all-over feeling of NOT feeling well (also)   Prior to Admission medications   Medication Sig Start Date End Date Taking? Authorizing Provider  atorvastatin (LIPITOR) 40 MG tablet TAKE 1 TABLET (40 MG TOTAL) BY MOUTH DAILY. 09/06/20 09/06/21 Yes Gildardo Pounds, NP  carvedilol (COREG)  6.25 MG tablet TAKE 1 TABLET (6.25 MG TOTAL) BY MOUTH TWO TIMES DAILY WITH A MEAL. 08/13/20 08/13/21 Yes Kc, Maren Beach, MD  imipramine (TOFRANIL) 25 MG tablet TAKE 1 TABLET (25 MG TOTAL) BY MOUTH AT BEDTIME. FOR DEPRESSION 09/06/20 09/06/21 Yes Gildardo Pounds, NP  losartan (COZAAR) 50 MG tablet TAKE 1 TABLET (50 MG TOTAL) BY MOUTH DAILY. 08/13/20 08/13/21 Yes Antonieta Pert, MD  sevelamer carbonate (RENVELA) 800 MG tablet Take 2 tablets (1,600 mg total) by mouth 3 (three) times daily with meals 10/20/20 12/17/20 Yes   torsemide (DEMADEX) 20 MG tablet TAKE 2 TABLETS (40 MG) DAILY ON NON HEMODIALYSIS DAYS  08/13/20 08/13/21 Yes Antonieta Pert, MD  atorvastatin (LIPITOR) 40 MG tablet Take 1 tablet (40 mg total) by mouth daily. Patient not taking: No sig reported 09/06/20   Gildardo Pounds, NP  Blood Glucose Monitoring Suppl (TRUE METRIX METER) w/Device KIT Use as instructed. Check blood glucose level by fingerstick once per day. 09/06/20   Gildardo Pounds, NP  glucose blood (TRUE METRIX BLOOD GLUCOSE TEST) test strip Use as instructed. Check blood glucose level by fingerstick once per day. 09/06/20   Gildardo Pounds, NP  glucose blood test strip USE AS INSTRUCTED. CHECK BLOOD GLUCOSE LEVEL BY FINGERSTICK ONCE PER DAY. Patient taking differently: USE AS INSTRUCTED. CHECK BLOOD GLUCOSE LEVEL BY FINGERSTICK ONCE PER DAY. 09/06/20 09/06/21  Gildardo Pounds, NP  imipramine (TOFRANIL) 25 MG tablet Take 1 tablet (25 mg total) by mouth at bedtime. FOR DEPRESSION Patient not taking: No sig reported 09/06/20   Gildardo Pounds, NP  insulin detemir (LEVEMIR) 100 UNIT/ML injection Inject 0.15 mLs (15 Units total) into the skin daily. Patient taking differently: Inject 10 Units into the skin daily. 05/10/20   Amin, Jeanella Flattery, MD  sevelamer carbonate (RENVELA) 800 MG tablet TAKE 1 TABLET (800 MG TOTAL) BY MOUTH THREE TIMES DAILY WITH MEALS. Patient not taking: No sig reported 08/13/20 08/13/21  Antonieta Pert, MD  TRUEplus Lancets 28G MISC Use as instructed. Check blood glucose level by fingerstick once per day. 09/06/20   Gildardo Pounds, NP  TRUEplus Lancets 28G MISC USE AS INSTRUCTED. CHECK BLOOD GLUCOSE LEVEL BY FINGERSTICK ONCE PER DAY. 09/06/20 09/06/21  Gildardo Pounds, NP   Current Facility-Administered Medications  Medication Dose Route Frequency Provider Last Rate Last Admin  . 0.9 %  sodium chloride infusion  100 mL Intravenous PRN Doutova, Anastassia, MD      . 0.9 %  sodium chloride infusion  100 mL Intravenous PRN Doutova, Anastassia, MD      . 0.9 %  sodium chloride infusion  250 mL Intravenous PRN Doutova,  Anastassia, MD      . acetaminophen (TYLENOL) tablet 650 mg  650 mg Oral Q6H PRN Doutova, Anastassia, MD       Or  . acetaminophen (TYLENOL) suppository 650 mg  650 mg Rectal Q6H PRN Doutova, Anastassia, MD      . alteplase (CATHFLO ACTIVASE) injection 2 mg  2 mg Intracatheter Once PRN Toy Baker, MD      . atorvastatin (LIPITOR) tablet 40 mg  40 mg Oral Daily Doutova, Anastassia, MD   40 mg at 11/24/20 2233  . carvedilol (COREG) tablet 6.25 mg  6.25 mg Oral BID WC Doutova, Anastassia, MD   6.25 mg at 11/25/20 0800  . cefTRIAXone (ROCEPHIN) 2 g in sodium chloride 0.9 % 100 mL IVPB  2 g Intravenous Q24H Toy Baker, MD  And  . metroNIDAZOLE (FLAGYL) IVPB 500 mg  500 mg Intravenous Q8H Doutova, Anastassia, MD 100 mL/hr at 11/25/20 1520 500 mg at 11/25/20 1520  . Chlorhexidine Gluconate Cloth 2 % PADS 6 each  6 each Topical Q0600 Doutova, Anastassia, MD      . heparin injection 5,000 Units  5,000 Units Subcutaneous Q8H Alma Friendly, MD   5,000 Units at 11/25/20 1518  . heparin sodium (porcine) injection 1,000 Units  1,000 Units Intravenous Q T,Th,Sa-HD Alma Friendly, MD      . HYDROcodone-acetaminophen (NORCO/VICODIN) 5-325 MG per tablet 1-2 tablet  1-2 tablet Oral Q4H PRN Toy Baker, MD      . hydrOXYzine (ATARAX/VISTARIL) tablet 25 mg  25 mg Oral TID PRN Toy Baker, MD   25 mg at 11/25/20 0211  . insulin aspart (novoLOG) injection 0-5 Units  0-5 Units Subcutaneous QHS Alma Friendly, MD      . insulin aspart (novoLOG) injection 0-6 Units  0-6 Units Subcutaneous TID WC Alma Friendly, MD   1 Units at 11/25/20 1243  . insulin detemir (LEVEMIR) injection 10 Units  10 Units Subcutaneous Daily Doutova, Anastassia, MD      . labetalol (NORMODYNE) injection 10 mg  10 mg Intravenous Q2H PRN Toy Baker, MD   10 mg at 11/25/20 0943  . lidocaine (PF) (XYLOCAINE) 1 % injection 5 mL  5 mL Intradermal PRN Doutova, Anastassia, MD      .  lidocaine-prilocaine (EMLA) cream 1 application  1 application Topical PRN Doutova, Anastassia, MD      . living well with diabetes book MISC   Does not apply Once Alma Friendly, MD      . losartan (COZAAR) tablet 50 mg  50 mg Oral Daily Alma Friendly, MD   50 mg at 11/25/20 1003  . pentafluoroprop-tetrafluoroeth (GEBAUERS) aerosol 1 application  1 application Topical PRN Doutova, Anastassia, MD      . sevelamer carbonate (RENVELA) tablet 800 mg  800 mg Oral TID WC Doutova, Anastassia, MD   800 mg at 11/25/20 1244  . sodium chloride flush (NS) 0.9 % injection 3 mL  3 mL Intravenous Q12H Doutova, Anastassia, MD   3 mL at 11/24/20 2233  . sodium chloride flush (NS) 0.9 % injection 3 mL  3 mL Intravenous PRN Toy Baker, MD      . Derrill Memo ON 11/26/2020] torsemide (DEMADEX) tablet 40 mg  40 mg Oral Once per day on Sun Mon Wed Fri Sat Alma Friendly, MD       Labs: Basic Metabolic Panel: Recent Labs  Lab 11/24/20 1807 11/25/20 0006 11/25/20 0032 11/25/20 0258  NA 138 136 137 137  K 6.3* 6.5* 6.6* 5.8*  CL 100 97*  --  98  CO2 25 24  --  24  GLUCOSE 120* 156*  --  120*  BUN 84* 86*  --  86*  CREATININE 12.60* 13.26*  --  13.35*  CALCIUM 8.4* 8.1*  --  8.1*  PHOS  --   --   --  7.5*   Liver Function Tests: Recent Labs  Lab 11/24/20 1807 11/25/20 0258  AST 19 20  ALT 58* 56*  ALKPHOS 243* 250*  BILITOT 1.3* 1.6*  PROT 7.6 7.6  ALBUMIN 3.2* 3.1*   CBC: Recent Labs  Lab 11/24/20 1807 11/25/20 0032 11/25/20 0258  WBC 6.7  --  7.3  NEUTROABS 5.2  --  5.4  HGB 9.5* 10.5* 9.9*  HCT 30.9* 31.0*  31.9*  MCV 87.3  --  87.6  PLT 352  --  357   CBG: Recent Labs  Lab 11/24/20 1807 11/24/20 2218 11/24/20 2357 11/25/20 1238  GLUCAP 121* 113* 159* 184*   Iron Studies: No results for input(s): IRON, TIBC, TRANSFERRIN, FERRITIN in the last 72 hours. Studies/Results: DG CHEST PORT 1 VIEW  Result Date: 11/25/2020 CLINICAL DATA:  Acute respiratory failure  with hypoxia EXAM: PORTABLE CHEST 1 VIEW COMPARISON:  Nov 24, 2020 FINDINGS: Right chest wall dual lumen catheter with tip at the cavoatrial junction. Similar cardiac enlargement. Central vascular congestion. Diffuse interstitial opacities. Fluid in the bilateral fissures. No visible pneumothorax. Osseous structures are unchanged. IMPRESSION: Similar cardiac enlargement with central vascular congestion, probable trace bilateral pleural effusions and mild interstitial edema. Electronically Signed   By: Dahlia Bailiff MD   On: 11/25/2020 01:11   DG Chest Port 1 View  Result Date: 11/24/2020 CLINICAL DATA:  Shortness of breath. EXAM: PORTABLE CHEST 1 VIEW COMPARISON:  08/09/2020 FINDINGS: There is a right chest wall dual lumen catheter with tip at the cavoatrial junction. Stable cardiac enlargement. No pleural effusion or edema. No airspace densities identified. Visualized osseous structures are unremarkable. IMPRESSION: 1. Cardiac enlargement 2. No acute abnormality. Electronically Signed   By: Kerby Moors M.D.   On: 11/24/2020 20:07   DG Foot Complete Right  Result Date: 11/24/2020 CLINICAL DATA:  Recent toe injury. EXAM: RIGHT FOOT COMPLETE - 3+ VIEW COMPARISON:  None. FINDINGS: Remote fracture involving the distal aspect of the fifth metatarsal bone identified. There is mild diffuse soft tissue swelling. No underlying acute or dislocation fracture. No focal bone erosions. Vascular calcifications noted. IMPRESSION: 1. No acute osseous findings. 2. Soft tissue swelling 3. Remote fracture involving the distal aspect of the fifth metatarsal bone. Electronically Signed   By: Kerby Moors M.D.   On: 11/24/2020 19:13   VAS [place] ABI WITH/WO TBI  Result Date: 11/25/2020  LOWER EXTREMITY DOPPLER STUDY Patient Name:  [name]  Date of Exam:   11/25/2020 Medical Rec #: [id number]        Accession #:    [id number] Date of Birth: 04-24-1986        Patient Gender: M Patient Age:   16Y Exam Location:  [facility] Procedure:      VAS [place] ABI WITH/WO TBI Referring Phys: [id number] ANASTASSIA DOUTOVA --------------------------------------------------------------------------------  Indications: DM foot ulcer - RLE 1st and 3rd toes. High Risk Factors: Hypertension, hyperlipidemia, Diabetes, no history of                    smoking. Other Factors: ESRD/HD.  Vascular Interventions: LUE- AVF. Performing Technologist: Rogelia Rohrer RVT/RDMS  Examination Guidelines: A complete evaluation includes at minimum, Doppler waveform signals and systolic blood pressure reading at the level of bilateral brachial, anterior tibial, and posterior tibial arteries, when vessel segments are accessible. Bilateral testing is considered an integral part of a complete examination. Photoelectric Plethysmograph (PPG) waveforms and toe systolic pressure readings are included as required and additional duplex testing as needed. Limited examinations for reoccurring indications may be performed as noted.  ABI Findings: +---------+------------------+-----+---------+--------+ Right    Rt Pressure (mmHg)IndexWaveform Comment  +---------+------------------+-----+---------+--------+ Brachial 166                    triphasic         +---------+------------------+-----+---------+--------+ PTA  triphasic>254     +---------+------------------+-----+---------+--------+ DP                              triphasic>254     +---------+------------------+-----+---------+--------+ Lona Millard               1.13 Normal            +---------+------------------+-----+---------+--------+ +---------+------------------+-----+---------+--------+ Left     Lt Pressure (mmHg)IndexWaveform Comment  +---------+------------------+-----+---------+--------+ Brachial                                 AVF- DIA +---------+------------------+-----+---------+--------+ PTA                             triphasic>254      +---------+------------------+-----+---------+--------+ DP       190               1.14 triphasic         +---------+------------------+-----+---------+--------+ Jamas Lav               1.19 Normal            +---------+------------------+-----+---------+--------+  Summary: Right: Resting right ankle-brachial index indicates noncompressible right lower extremity arteries. The right toe-brachial index is normal. ABIs are unreliable. RT great toe pressure = 187 mmHg. Left: Resting left ankle-brachial index is within normal range. No evidence of significant left lower extremity arterial disease. The left toe-brachial index is normal. ABIs are unreliable. LT Great toe pressure = 197 mmHg.  *See table(s) above for measurements and observations.  Electronically signed by Deitra Mayo MD on 11/25/2020 at 1:05:56 PM.    Final     ROS: All others negative except those listed in HPI.  Physical Exam: Vitals:   11/25/20 0940 11/25/20 0950 11/25/20 0955 11/25/20 1035  BP: (!) 198/118 (!) 191/111 (!) 187/109 (!) 187/117  Pulse: (!) 103 93 91 93  Resp: 16   18  Temp:    98.7 F (37.1 C)  TempSrc:    Oral  SpO2: 100% 100% 99% 100%     General: WDWN male in NAD Head: NCAT sclera not icteric MMM Neck: Supple. Lungs: CTA bilaterally. No wheeze, rales or rhonchi. Breathing is unlabored on 2L O2. Heart: +tachycardia. No murmur, rubs or gallops appreciated.  Abdomen: soft, nontender, +BS, no guarding, no rebound tenderness M/S:  Equal strength b/l in upper and lower extremities.  Lower extremities:trace edema, ischemic changes, or open wounds  Neuro: AAOx3. Moves all extremities spontaneously. Psych:  Responds to questions appropriately with a normal affect. Dialysis Access: TDC in use, LU AVF +b/t  Dialysis Orders:  MWF - SW  4hrs, BFR 450, DFR AF 1.5,  EDW 92kg, 2K/ 2Ca  Access: TDC, LU AVF  Heparin none Mircera 225 mcg q2wks - last 263mcg on 5/2 Venofer $RemoveBef'50mg'tdtUxHxZRY$  qwk [name] 37mcg IV  qHD    Assessment/Plan: 1.  Hyperkalemia - K improved to 5.8 after lokelma.  Orders written for urgent HD this AM.   2. Blurred vision - ongoing. Etiology unclear. Needs to see ophthalmology.  3. Foot ulcer - ABX started.  No evidence of osteomyelitis on xray.  Per primary.  4.  ESRD -  On HD MWF.  Urgent HD today due to elevated K.  Orders written for HD tomorrow per regular schedule using AVF.   5.  Hypertension -  BP elevated, has been out of meds.  Stressed compliance.  Not improving with dialysis. Continue to follow.  6. Volume - CXR this AM with interstitial edema.  EDW lowered at OP.  Will challenge tomorrow with HD. Standing weights.  7.  Anemia of CKD - Hgb 9.9  ESA due yesterday.  Will order with HD tomorrow.  8.  Secondary Hyperparathyroidism -  CCa at goal.  Phos elevated.  Continue binders and VDRA. 9.  Nutrition - Renal diet w/fluid restrictions.  10.  DMT2 - per primary  Jen Mow, PA-C [facility] 11/25/2020, 3:21 PM

## 2020-11-25 NOTE — Progress Notes (Signed)
ABI exam has been completed.  Results can be found under chart review under CV PROC. 11/25/2020 12:54 PM Ashlie Mcmenamy RVT, RDMS

## 2020-11-25 NOTE — ED Notes (Signed)
Pt ambulatory to bathroom

## 2020-11-25 NOTE — Progress Notes (Signed)
Inpatient Diabetes Program Recommendations  AACE/ADA: New Consensus Statement on Inpatient Glycemic Control (2015)  Target Ranges:  Prepandial:   less than 140 mg/dL      Peak postprandial:   less than 180 mg/dL (1-2 hours)      Critically ill patients:  140 - 180 mg/dL   Lab Results  Component Value Date   GLUCAP 159 (H) 11/24/2020   HGBA1C 7.2 (H) 11/25/2020    Review of Glycemic Control  Diabetes history: DM2 Outpatient Diabetes medications: Levemir 10 units qd Current orders for Inpatient glycemic control: Levemir 10 units + Novolog 0-6 units tid  Inpatient Diabetes Program Recommendations:   Received consult regarding diabetes management. A1c 7.2 which is good control for ESRD patients. Since patient is now eating: -Change Novolog correction to 0-6 units tid + hs 0-5 units Secure chat sent to Dr. Horris Latino. Ordered Living Well With Diabetes book for patient review.  Thank you, Nani Gasser. Binyomin Brann, RN, MSN, CDE  Diabetes Coordinator Inpatient Glycemic Control Team Team Pager [phone] (8am-5pm) 11/25/2020 7:57 AM

## 2020-11-25 NOTE — Consult Note (Signed)
[facility] Nurse Consult Note: Reason for Consult: foot wound Patient endorses "pulling off loose skin", no pain at site Wound type: full and partial thickness skin loss secondary to trauma in the presence of ESRD and DM  Pressure Injury POA: NA Measurement: less than 1cm areas on the dorsal great toe, area to the lateral aspect of the nail bed of the great toe, partial thickness skin loss over the dorsal surface 3rd toe (less than 0.5cm ) Wound bed: all clean, pink, do not appear infected  Drainage (amount, consistency, odor) none Periwound: mild edema  Dressing procedure/placement/frequency: Non adherent with xeroform, as well as antibacterial effects. Top with dry dressing. Change daily to assess for acute changes in the wounds.   Discussed POC with patient and bedside nurse.  Re consult if needed, will not follow at this time. Thanks  Sylvain Hasten [employer], RN,CWOCN, CNS, [place] [phone])

## 2020-11-25 NOTE — Significant Event (Signed)
Rapid Response Event Note   Reason for Call :  Hypertension 201/118. 3.5L removed during HD treatment.    Initial Focused Assessment:  Pt alert, oriented. C/o blurry vision. Denies headache. PERRLA, 31m. States vision is unchanged from admission. Purposeful movement in all four extremities.   Pt tachycardic. Pulse is 2+, regular.   Skin is warm, dry. Pt in no distress.   VS: T 98.7, BP 201/118, HR 101, RR 16, SpO2 100% on 2LNC  Interventions:  -PRN '10mg'$  IV Labetalol given -Scheduled Losartan and Clonidine given  Plan of Care:  -Q1H VS check x2 hours, notify if BP remains elevated  Call rapid response for additional needs  Event Summary:  MD Notified: EHorris LatinoCall Time: [id number] Time: 0940 End Time: 1[place] RN

## 2020-11-25 NOTE — Procedures (Signed)
   I was present at this dialysis session, have reviewed the session itself and made  appropriate changes Kelly Splinter MD [place] pager [phone]   11/25/2020, 4:12 PM

## 2020-11-25 NOTE — Progress Notes (Addendum)
PROGRESS NOTE  [name] [id number] DOB: 27-Nov-1985 DOA: 11/24/2020 PCP: Gildardo Pounds, NP  HPI/Recap of past 24 hours: [name] is a 35 y.o. 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 Pt presented to the ED, complaining of blurry vision for the past 4 days, denied any other focal neurologic deficits.  Patient reports he has been off his blood pressure medications for months, he ran out and did not want to take it anymore because it made him nauseous.  Patient also reports that he was not compliant with his insulin as well.  He has been compliant to his HD sessions, but missed on 11/24/2020 as he was here in the waiting room and fell asleep.  Patient also reports a wound/ulcer on his right great toe and right third toe.  Reports he was trying to peel off dead skin, causing the wound/ulcer to bleed/drain, denies any pus drainage, noted some warmth, erythema.  Patient denies any chest pain, shortness of breath, fever/chills, headaches, no other focal neurologic deficits noted.  In the ED, vital signs stable except for BP 176/117, labs showed potassium of 6.3, chest x-ray showed mild interstitial edema.  Nephrology consulted.  Patient admitted for further management.    Today, saw patient during dialysis, still reporting blurry vision, denies any headaches, diplopia, dizziness, no other focal neurologic deficits noted.  Denies any chest pain, shortness of breath, abdominal pain, nausea/vomiting, fever/chills.     Assessment/Plan: Active Problems:   Essential hypertension   DM2 (diabetes mellitus, type 2) (HCC)   Anemia of chronic kidney failure   Hyperkalemia   Diabetic foot ulcer (HCC)   Acute respiratory failure with hypoxia (HCC)   Volume overload ESRD on HD M/W/F, oliguric Hyperkalemia Chest x-ray showed mild interstitial edema Missed HD on 11/24/2020 Nephrology consulted, had HD on 11/25/2020 Daily renal panel  Hypertensive  crisis Likely 2/2 noncompliance with meds Continue Coreg, losartan, torsemide (on non HD days), labetalol as needed  Blurry vision Likely 2/2 possibly uncontrolled hypertension No other focal neurologic deficits noted If worsening, may obtain a CT head Outpatient evaluation for possible hypertensive retinopathy Monitor closely  Right foot ulcers Noted ulcer/wound on right great toe, and third toe, no pus drainage noted, erythema noted, does not appear overly infected WOC consulted Continue ceftriaxone, Flagyl, vancomycin for now, plan to de-escalate or discontinue  Diabetes mellitus type 2 Last A1c 7.2 Continue SSI, Levemir, Accu-Cheks, hypoglycemic protocol  Anemia of chronic kidney disease Hemoglobin at baseline Daily CBC    Estimated body mass index is 26.31 kg/m as calculated from the following:   Height as of an earlier encounter on 11/24/20: 6' (1.829 m).   Weight as of an earlier encounter on 11/24/20: 88 kg.      Code Status: Full  Family Communication: None at bedside  Disposition Plan: Status is: Inpatient  Remains inpatient appropriate because:Inpatient level of care appropriate due to severity of illness   Dispo: The patient is from: Home              Anticipated d/c is to: Home              Patient currently is not medically stable to d/c.   Difficult to place patient No   Consultants:  Nephrology  Procedures:  None  Antimicrobials: Ceftriaxone Flagyl Vancomycin  DVT prophylaxis: Heparin [place]   Objective: Vitals:   11/25/20 0700 11/25/20 0730 11/25/20 0800 11/25/20 0830  BP: (!) 185/118 (!) 185/111 Marland Kitchen)  189/116 (!) 186/125  Pulse:   94   Resp: '15 13 16   '$ Temp:      TempSrc:      SpO2:   100%     Intake/Output Summary (Last 24 hours) at 11/25/2020 0841 Last data filed at 11/25/2020 0359 Gross per 24 hour  Intake 608.08 ml  Output --  Net 608.08 ml   There were no vitals filed for this visit.  Exam:  General: NAD    Cardiovascular: S1, S2 present  Respiratory: CTAB  Abdomen: Soft, nontender, nondistended, bowel sounds present  Musculoskeletal: No bilateral pedal edema noted, ulcer noted on right great toe and third toe on the right, with erythema, warmth, no pus drainage noted  Skin:  As above  Psychiatry: Normal mood  Neurology: Strength equal in all extremities, sensation intact, noted blurry vision, no other focal neurologic deficits noted   Data Reviewed: CBC: Recent Labs  Lab 11/24/20 1807 11/25/20 0032 11/25/20 0258  WBC 6.7  --  7.3  NEUTROABS 5.2  --  5.4  HGB 9.5* 10.5* 9.9*  HCT 30.9* 31.0* 31.9*  MCV 87.3  --  87.6  PLT 352  --  [id number]   Basic Metabolic Panel: Recent Labs  Lab 11/24/20 1807 11/25/20 0006 11/25/20 0032 11/25/20 0258  NA 138 136 137 137  K 6.3* 6.5* 6.6* 5.8*  CL 100 97*  --  98  CO2 25 24  --  24  GLUCOSE 120* 156*  --  120*  BUN 84* 86*  --  86*  CREATININE 12.60* 13.26*  --  13.35*  CALCIUM 8.4* 8.1*  --  8.1*  MG  --   --   --  2.6*  PHOS  --   --   --  7.5*   GFR: Estimated Creatinine Clearance: 8.5 mL/min (A) (by C-G formula based on SCr of 13.35 mg/dL (H)). Liver Function Tests: Recent Labs  Lab 11/24/20 1807 11/25/20 0258  AST 19 20  ALT 58* 56*  ALKPHOS 243* 250*  BILITOT 1.3* 1.6*  PROT 7.6 7.6  ALBUMIN 3.2* 3.1*   No results for input(s): LIPASE, AMYLASE in the last 168 hours. No results for input(s): AMMONIA in the last 168 hours. Coagulation Profile: No results for input(s): INR, PROTIME in the last 168 hours. Cardiac Enzymes: No results for input(s): CKTOTAL, CKMB, CKMBINDEX, TROPONINI in the last 168 hours. BNP (last 3 results) No results for input(s): PROBNP in the last 8760 hours. HbA1C: Recent Labs    11/25/20 0006  HGBA1C 7.2*   CBG: Recent Labs  Lab 11/24/20 1807 11/24/20 2218 11/24/20 2357  GLUCAP 121* 113* 159*   Lipid Profile: No results for input(s): CHOL, HDL, LDLCALC, TRIG, CHOLHDL, LDLDIRECT  in the last 72 hours. Thyroid Function Tests: Recent Labs    11/25/20 0258  TSH 1.364   Anemia Panel: No results for input(s): VITAMINB12, FOLATE, FERRITIN, TIBC, IRON, RETICCTPCT in the last 72 hours. Urine analysis:    Component Value Date/Time   COLORURINE YELLOW 11/25/2020 0301   APPEARANCEUR CLEAR 11/25/2020 0301   LABSPEC 1.016 11/25/2020 0301   PHURINE 7.0 11/25/2020 0301   GLUCOSEU 50 (A) 11/25/2020 0301   HGBUR SMALL (A) 11/25/2020 0301   BILIRUBINUR NEGATIVE 11/25/2020 0301   KETONESUR NEGATIVE 11/25/2020 0301   PROTEINUR >=300 (A) 11/25/2020 0301   UROBILINOGEN 0.2 07/16/2014 1747   NITRITE NEGATIVE 11/25/2020 0301   LEUKOCYTESUR NEGATIVE 11/25/2020 0301   Sepsis Labs: '@LABRCNTIP'$ (procalcitonin:4,lacticidven:4)  ) Recent Results (from the past  240 hour(s))  Blood culture (routine x 2)     Status: None (Preliminary result)   Collection Time: 11/24/20  7:30 PM   Specimen: BLOOD  Result Value Ref Range Status   Specimen Description BLOOD RIGHT ANTECUBITAL  Final   Special Requests   Final    BOTTLES DRAWN AEROBIC AND ANAEROBIC Blood Culture adequate volume   Culture   Final    NO GROWTH < 12 HOURS Performed at [facility], [place] [address], [place], [place] [address]    Report Status PENDING  Incomplete  Resp Panel by RT-PCR (Flu A&B, Covid) Nasopharyngeal Swab     Status: None   Collection Time: 11/24/20  7:41 PM   Specimen: Nasopharyngeal Swab; Nasopharyngeal(NP) swabs in vial transport medium  Result Value Ref Range Status   SARS Coronavirus 2 by RT PCR NEGATIVE NEGATIVE Final    Comment: (NOTE) SARS-CoV-2 target nucleic acids are NOT DETECTED.  The SARS-CoV-2 RNA is generally detectable in upper respiratory specimens during the acute phase of infection. The lowest concentration of SARS-CoV-2 viral copies this assay can detect is 138 copies/mL. A negative result does not preclude SARS-Cov-2 infection and should not be used as the sole basis for  treatment or other patient management decisions. A negative result may occur with  improper specimen collection/handling, submission of specimen other than nasopharyngeal swab, presence of viral mutation(s) within the areas targeted by this assay, and inadequate number of viral copies(<138 copies/mL). A negative result must be combined with clinical observations, patient history, and epidemiological information. The expected result is Negative.  Fact Sheet for Patients:  EntrepreneurPulse.com.au  Fact Sheet for Healthcare Providers:  IncredibleEmployment.be  This test is no t yet approved or cleared by the [place] FDA and  has been authorized for detection and/or diagnosis of SARS-CoV-2 by FDA under an Emergency Use Authorization (EUA). This EUA will remain  in effect (meaning this test can be used) for the duration of the COVID-19 declaration under Section 564(b)(1) of the Act, 21 U.S.C.section 360bbb-3(b)(1), unless the authorization is terminated  or revoked sooner.       Influenza A by PCR NEGATIVE NEGATIVE Final   Influenza B by PCR NEGATIVE NEGATIVE Final    Comment: (NOTE) The Xpert Xpress SARS-CoV-2/FLU/RSV plus assay is intended as an aid in the diagnosis of influenza from Nasopharyngeal swab specimens and should not be used as a sole basis for treatment. Nasal washings and aspirates are unacceptable for Xpert Xpress SARS-CoV-2/FLU/RSV testing.  Fact Sheet for Patients: EntrepreneurPulse.com.au  Fact Sheet for Healthcare Providers: IncredibleEmployment.be  This test is not yet approved or cleared by the [place] FDA and has been authorized for detection and/or diagnosis of SARS-CoV-2 by FDA under an Emergency Use Authorization (EUA). This EUA will remain in effect (meaning this test can be used) for the duration of the COVID-19 declaration under Section 564(b)(1) of the Act, 21  U.S.C. section 360bbb-3(b)(1), unless the authorization is terminated or revoked.  Performed at [facility], [place] [address], [place], [place] [address]   Blood culture (routine x 2)     Status: None (Preliminary result)   Collection Time: 11/24/20  7:55 PM   Specimen: BLOOD  Result Value Ref Range Status   Specimen Description BLOOD BLOOD RIGHT FOREARM  Final   Special Requests   Final    BOTTLES DRAWN AEROBIC AND ANAEROBIC Blood Culture adequate volume   Culture   Final    NO GROWTH < 12 HOURS Performed  at [facility], [place] [address] [place], [place] [address]    Report Status PENDING  Incomplete  MRSA PCR Screening     Status: None   Collection Time: 11/24/20 11:59 PM   Specimen: Nasal Mucosa; Nasopharyngeal  Result Value Ref Range Status   MRSA by PCR NEGATIVE NEGATIVE Final    Comment:        The GeneXpert MRSA Assay (FDA approved for NASAL specimens only), is one component of a comprehensive MRSA colonization surveillance program. It is not intended to diagnose MRSA infection nor to guide or monitor treatment for MRSA infections. Performed at [facility], [place] [address] [place],  [address]       Studies: DG CHEST PORT 1 VIEW  Result Date: 11/25/2020 CLINICAL DATA:  Acute respiratory failure with hypoxia EXAM: PORTABLE CHEST 1 VIEW COMPARISON:  Nov 24, 2020 FINDINGS: Right chest wall dual lumen catheter with tip at the cavoatrial junction. Similar cardiac enlargement. Central vascular congestion. Diffuse interstitial opacities. Fluid in the bilateral fissures. No visible pneumothorax. Osseous structures are unchanged. IMPRESSION: Similar cardiac enlargement with central vascular congestion, probable trace bilateral pleural effusions and mild interstitial edema. Electronically Signed   By: Dahlia Bailiff MD   On: 11/25/2020 01:11   DG Chest Port 1 View  Result Date: 11/24/2020 CLINICAL DATA:  Shortness of breath. EXAM: PORTABLE CHEST 1  VIEW COMPARISON:  08/09/2020 FINDINGS: There is a right chest wall dual lumen catheter with tip at the cavoatrial junction. Stable cardiac enlargement. No pleural effusion or edema. No airspace densities identified. Visualized osseous structures are unremarkable. IMPRESSION: 1. Cardiac enlargement 2. No acute abnormality. Electronically Signed   By: Kerby Moors M.D.   On: 11/24/2020 20:07   DG Foot Complete Right  Result Date: 11/24/2020 CLINICAL DATA:  Recent toe injury. EXAM: RIGHT FOOT COMPLETE - 3+ VIEW COMPARISON:  None. FINDINGS: Remote fracture involving the distal aspect of the fifth metatarsal bone identified. There is mild diffuse soft tissue swelling. No underlying acute or dislocation fracture. No focal bone erosions. Vascular calcifications noted. IMPRESSION: 1. No acute osseous findings. 2. Soft tissue swelling 3. Remote fracture involving the distal aspect of the fifth metatarsal bone. Electronically Signed   By: Kerby Moors M.D.   On: 11/24/2020 19:13    Scheduled Meds: . atorvastatin  40 mg Oral Daily  . carvedilol  6.25 mg Oral BID WC  . Chlorhexidine Gluconate Cloth  6 each Topical Q0600  . insulin aspart  0-5 Units Subcutaneous QHS  . insulin aspart  0-6 Units Subcutaneous TID WC  . insulin detemir  10 Units Subcutaneous Daily  . living well with diabetes book   Does not apply Once  . sevelamer carbonate  800 mg Oral TID WC  . sodium chloride flush  3 mL Intravenous Q12H    Continuous Infusions: . sodium chloride    . sodium chloride    . sodium chloride    . cefTRIAXone (ROCEPHIN)  IV     And  . metronidazole Stopped (11/25/20 0019)  . [START ON 11/26/2020] vancomycin       LOS: 1 day     Alma Friendly, MD [facility]  If 7PM-7AM, please contact night-coverage [email or web address] 11/25/2020, 8:41 AM

## 2020-11-25 NOTE — ED Notes (Signed)
Placed Breakfast order 

## 2020-11-25 NOTE — ED Notes (Addendum)
Pt sleeping - desatting in to the 80s - this RN started on 2L [place] - pt back up to 100%

## 2020-11-26 ENCOUNTER — Inpatient Hospital Stay (HOSPITAL_COMMUNITY): Payer: Medicaid Other

## 2020-11-26 LAB — COMPREHENSIVE METABOLIC PANEL
ALT: 44 U/L (ref 0–44)
AST: 17 U/L (ref 15–41)
Albumin: 2.7 g/dL — ABNORMAL LOW (ref 3.5–5.0)
Alkaline Phosphatase: 226 U/L — ABNORMAL HIGH (ref 38–126)
Anion gap: 11 (ref 5–15)
BUN: 54 mg/dL — ABNORMAL HIGH (ref 6–20)
CO2: 26 mmol/L (ref 22–32)
Calcium: 7.9 mg/dL — ABNORMAL LOW (ref 8.9–10.3)
Chloride: 97 mmol/L — ABNORMAL LOW (ref 98–111)
Creatinine, Ser: 9.01 mg/dL — ABNORMAL HIGH (ref 0.61–1.24)
GFR, Estimated: 7 mL/min — ABNORMAL LOW (ref >60)
Glucose, Bld: 199 mg/dL — ABNORMAL HIGH (ref 70–99)
Potassium: 4.8 mmol/L (ref 3.5–5.1)
Sodium: 134 mmol/L — ABNORMAL LOW (ref 135–145)
Total Bilirubin: 1 mg/dL (ref 0.3–1.2)
Total Protein: 6.6 g/dL (ref 6.5–8.1)

## 2020-11-26 LAB — CBC WITH DIFFERENTIAL/PLATELET
Abs Immature Granulocytes: 0.03 10*3/uL (ref 0.00–0.07)
Basophils Absolute: 0 10*3/uL (ref 0.0–0.1)
Basophils Relative: 1 %
Eosinophils Absolute: 0.2 10*3/uL (ref 0.0–0.5)
Eosinophils Relative: 3 %
HCT: 29.4 % — ABNORMAL LOW (ref 39.0–52.0)
Hemoglobin: 9 g/dL — ABNORMAL LOW (ref 13.0–17.0)
Immature Granulocytes: 0 %
Lymphocytes Relative: 11 %
Lymphs Abs: 0.8 10*3/uL (ref 0.7–4.0)
MCH: 26.7 pg (ref 26.0–34.0)
MCHC: 30.6 g/dL (ref 30.0–36.0)
MCV: 87.2 fL (ref 80.0–100.0)
Monocytes Absolute: 0.5 10*3/uL (ref 0.1–1.0)
Monocytes Relative: 8 %
Neutro Abs: 5.6 10*3/uL (ref 1.7–7.7)
Neutrophils Relative %: 77 %
Platelets: 326 10*3/uL (ref 150–400)
RBC: 3.37 MIL/uL — ABNORMAL LOW (ref 4.22–5.81)
RDW: 20.5 % — ABNORMAL HIGH (ref 11.5–15.5)
WBC: 7.2 10*3/uL (ref 4.0–10.5)
nRBC: 0 % (ref 0.0–0.2)

## 2020-11-26 LAB — GLUCOSE, CAPILLARY
Glucose-Capillary: 243 mg/dL — ABNORMAL HIGH (ref 70–99)
Glucose-Capillary: 150 mg/dL — ABNORMAL HIGH (ref 70–99)
Glucose-Capillary: 203 mg/dL — ABNORMAL HIGH (ref 70–99)
Glucose-Capillary: 126 mg/dL — ABNORMAL HIGH (ref 70–99)

## 2020-11-26 MED ORDER — NEPRO/CARBSTEADY PO LIQD
237.0000 mL | Freq: Three times a day (TID) | ORAL | Status: DC
  Administered 2020-11-26 – 2020-11-27 (×2): 237 mL via ORAL

## 2020-11-26 MED ORDER — SACCHAROMYCES BOULARDII 250 MG PO CAPS
250.0000 mg | ORAL_CAPSULE | Freq: Two times a day (BID) | ORAL | Status: DC
  Administered 2020-11-26 – 2020-11-27 (×3): 250 mg via ORAL
  Filled 2020-11-26 (×3): qty 1

## 2020-11-26 MED ORDER — RENA-VITE PO TABS
1.0000 | ORAL_TABLET | Freq: Every day | ORAL | Status: DC
  Administered 2020-11-26: 1 via ORAL
  Filled 2020-11-26: qty 1

## 2020-11-26 MED ORDER — DARBEPOETIN ALFA 150 MCG/0.3ML IJ SOSY
150.0000 ug | PREFILLED_SYRINGE | INTRAMUSCULAR | Status: DC
  Administered 2020-11-26: 150 ug via INTRAVENOUS

## 2020-11-26 MED ORDER — DARBEPOETIN ALFA 150 MCG/0.3ML IJ SOSY
PREFILLED_SYRINGE | INTRAMUSCULAR | Status: AC
  Filled 2020-11-26: qty 0.3

## 2020-11-26 NOTE — Progress Notes (Addendum)
[facility] Progress Note  Patient: [name]    [id number]  DOA: 11/24/2020     Date of Service: the patient was seen and examined on 11/26/2020  Brief hospital course: Patient with past medical history of  ESRDHD on M/W/F, HLD, DM2.  Presents with inability to go for hemodialysis due to pain resulting in volume overload and hypertensive urgency.  Has complaint of blurry vision ongoing since presentation.  CT head unremarkable.  No other focal deficit. Currently plan is HD.  Assessment and Plan: Volume overload ESRD on HD M/W/F, oliguric Hyperkalemia Chest x-ray showed mild interstitial edema Missed HD on 11/24/2020 Nephrology consulted, had HD on 11/25/2020 Daily renal panel  Hypertensive crisis Likely 2/2 noncompliance with meds Continue Coreg, losartan, torsemide (on non HD days), labetalol as needed  Blurry vision, peripheral vision loss Likely 2/2 possibly uncontrolled hypertension No other focal neurologic deficits noted Patient reports persistent blurry vision despite improvement in blood pressure.  CT head unremarkable. Outpatient evaluation for possible hypertensive retinopathy Will discuss with ophthalmology tomorrow.  Right foot ulcers Noted ulcer/wound on right great toe, and third toe, no pus drainage noted, erythema noted, does not appear overly infected WOC consulted Patient was on ceftriaxone, Flagyl, vancomycin  we will discontinue  Diabetes mellitus type 2 Last A1c 7.2 Continue SSI, Levemir, Accu-Cheks, hypoglycemic protocol  Anemia of chronic kidney disease Hemoglobin at baseline Daily CBC  Body mass index is 26.4 kg/m.  Nutrition Problem: Increased nutrient needs Etiology: chronic illness (ESRD/HD) Interventions: Interventions: [name] shake,MVI      Diet: Renal diet DVT Prophylaxis:   heparin injection 5,000 Units Start: 11/25/20 1400 SCDs Start: 11/24/20 2141    Advance goals of care discussion: Full code  Family  Communication: no family was present at bedside, at the time of interview.   Disposition:  Status is: Inpatient  Remains inpatient appropriate because:Ongoing diagnostic testing needed not appropriate for outpatient work up   Dispo: The patient is from: Home              Anticipated d/c is to: Home              Patient currently is not medically stable to d/c.   Difficult to place patient No        Subjective: Continues to have blurred vision.  No nausea no vomiting but no fever no chills.  No other focal deficit.  Reports that there was no complaint of blurred vision prior to this admission.  Physical Exam:  General: Appear in mild distress, no Rash; Oral Mucosa Clear, moist. no Abnormal Neck Mass Or lumps, Conjunctiva normal  Cardiovascular: S1 and S2 Present, no Murmur, Respiratory: good respiratory effort, Bilateral Air entry present and CTA, no Crackles, no wheezes Abdomen: Bowel Sound present, Soft and no tenderness Extremities: no Pedal edema Neurology: alert and oriented to time, place, and person affect appropriate. no new focal deficit Mental status AAOx3, speech normal, attention normal,  Cranial Nerves PERRL, EOM normal and present, facial sensation to light touch present,  Motor strength bilateral equal strength 5/5,  Sensation present to light touch,  reflexes present knee and biceps, babinski negative,  Proprioception normal,  Cerebellar test normal finger nose finger. On peripheral field of vision examination the patient is exhibiting generalized decrease in acuity across all visual fields other than central vision.  Gait not checked due to patient safety concerns  Vitals:   11/26/20 1210 11/26/20 1250 11/26/20 1652 11/26/20 1943  BP: 136/83 (!) 154/97 (!) 141/90 Marland Kitchen)  144/99  Pulse: 81 86 81 78  Resp: '18 15 16 16  '$ Temp: (!) 97.5 F (36.4 C) 97.6 F (36.4 C) 97.7 F (36.5 C) 98.9 F (37.2 C)  TempSrc: Oral Oral Oral Oral  SpO2: 98% 99% 99% 100%   Weight: 88.3 kg       Intake/Output Summary (Last 24 hours) at 11/26/2020 2023 Last data filed at 11/26/2020 1210 Gross per 24 hour  Intake 1840.04 ml  Output 3000 ml  Net -1159.96 ml   Filed Weights   11/26/20 0840 11/26/20 1210  Weight: 91.5 kg 88.3 kg    Data Reviewed: I have personally reviewed and interpreted daily labs, tele strips, imaging. I reviewed all nursing notes, pharmacy notes, vitals, pertinent old records I have discussed plan of care as described above with RN and patient/family.  CBC: Recent Labs  Lab 11/24/20 1807 11/25/20 0032 11/25/20 0258 11/26/20 0233  WBC 6.7  --  7.3 7.2  NEUTROABS 5.2  --  5.4 5.6  HGB 9.5* 10.5* 9.9* 9.0*  HCT 30.9* 31.0* 31.9* 29.4*  MCV 87.3  --  87.6 87.2  PLT 352  --  357 [id number]   Basic Metabolic Panel: Recent Labs  Lab 11/24/20 1807 11/25/20 0006 11/25/20 0032 11/25/20 0258 11/26/20 0233  NA 138 136 137 137 134*  K 6.3* 6.5* 6.6* 5.8* 4.8  CL 100 97*  --  98 97*  CO2 25 24  --  24 26  GLUCOSE 120* 156*  --  120* 199*  BUN 84* 86*  --  86* 54*  CREATININE 12.60* 13.26*  --  13.35* 9.01*  CALCIUM 8.4* 8.1*  --  8.1* 7.9*  MG  --   --   --  2.6*  --   PHOS  --   --   --  7.5*  --     Studies: CT HEAD WO CONTRAST  Result Date: 11/26/2020 CLINICAL DATA:  Diplopia. EXAM: CT HEAD WITHOUT CONTRAST TECHNIQUE: Contiguous axial images were obtained from the base of the skull through the vertex without intravenous contrast. COMPARISON:  None. FINDINGS: Brain: There is no evidence of an acute infarct, intracranial hemorrhage, mass, midline shift, or extra-axial fluid collection. The ventricles and sulci are normal. Vascular: No hyperdense vessel. Skull: No fracture or suspicious osseous lesion. Sinuses/Orbits: Visualized paranasal sinuses and mastoid air cells are clear. Unremarkable orbits. Other: Nonspecific thickening of the skin and subcutaneous soft tissues in the suboccipital region near the origin of the nuchal  ligament. IMPRESSION: Unremarkable CT appearance of the brain. Electronically Signed   By: Logan Bores M.D.   On: 11/26/2020 16:39    Scheduled Meds: . atorvastatin  40 mg Oral Daily  . carvedilol  6.25 mg Oral BID WC  . Chlorhexidine Gluconate Cloth  6 each Topical Q0600  . Chlorhexidine Gluconate Cloth  6 each Topical Q0600  . darbepoetin (ARANESP) injection - DIALYSIS  150 mcg Intravenous Q Wed-HD  . feeding supplement ([name] CARB STEADY)  237 mL Oral TID BM  . heparin injection (subcutaneous)  5,000 Units Subcutaneous Q8H  . heparin sodium (porcine)  1,000 Units Intravenous Q T,Th,Sa-HD  . insulin aspart  0-5 Units Subcutaneous QHS  . insulin aspart  0-6 Units Subcutaneous TID WC  . insulin detemir  10 Units Subcutaneous Daily  . living well with diabetes book   Does not apply Once  . losartan  50 mg Oral Daily  . multivitamin  1 tablet Oral QHS  . saccharomyces boulardii  250  mg Oral BID  . sevelamer carbonate  800 mg Oral TID WC  . sodium chloride flush  3 mL Intravenous Q12H  . torsemide  40 mg Oral Once per day on Sun Mon Wed Fri Sat   Continuous Infusions: . sodium chloride    . sodium chloride    . sodium chloride     PRN Meds: sodium chloride, sodium chloride, sodium chloride, acetaminophen **OR** acetaminophen, alteplase, HYDROcodone-acetaminophen, hydrOXYzine, labetalol, lidocaine (PF), lidocaine-prilocaine, pentafluoroprop-tetrafluoroeth, sodium chloride flush  Time spent: 35 minutes  Author: Berle Mull, MD [facility] 11/26/2020 8:23 PM  To reach On-call, see care teams to locate the attending and reach out via [email or web address]. Between 7PM-7AM, please contact night-coverage If you still have difficulty reaching the attending provider, please page the [facility] (Director on Call) for [facility] on amion for assistance.

## 2020-11-26 NOTE — Progress Notes (Signed)
[facility] Progress Note   Subjective:   Patient seen and examined at bedside during dialysis.  Tolerating treatment well so far using 16G needles in LU AVF.  BP improving with HD.  Net UF goal 3L. Reports continued blurred vision, believes it may be a little worse today.  Denies eye pain, HA, n/v/d, abdominal pain, CP and SOB.    Objective Vitals:   11/26/20 0845 11/26/20 0900 11/26/20 0930 11/26/20 1000  BP: (!) 150/101 (!) 154/98 (!) 157/99 132/82  Pulse: 79 76 80 82  Resp: '18  20 18  '$ Temp: 97.7 F (36.5 C)     TempSrc: Oral     SpO2: 97%     Weight:       Physical Exam General:well appearing male in NAD Heart:RRR, no mrg Lungs:CTAB, nml WOB Abdomen:soft, NTND Extremities:trace LE edema, +ulcers on multiple toes of R foot Dialysis Access: LU AVF in use, TDC c/d/i   [facility] Weights   11/26/20 0840  Weight: 91.5 kg    Intake/Output Summary (Last 24 hours) at 11/26/2020 1026 Last data filed at 11/26/2020 0659 Gross per 24 hour  Intake 1120.04 ml  Output --  Net 1120.04 ml    Additional Objective Labs: Basic Metabolic Panel: Recent Labs  Lab 11/25/20 0006 11/25/20 0032 11/25/20 0258 11/26/20 0233  NA 136 137 137 134*  K 6.5* 6.6* 5.8* 4.8  CL 97*  --  98 97*  CO2 24  --  24 26  GLUCOSE 156*  --  120* 199*  BUN 86*  --  86* 54*  CREATININE 13.26*  --  13.35* 9.01*  CALCIUM 8.1*  --  8.1* 7.9*  PHOS  --   --  7.5*  --    Liver Function Tests: Recent Labs  Lab 11/24/20 1807 11/25/20 0258 11/26/20 0233  AST '19 20 17  '$ ALT 58* 56* 44  ALKPHOS 243* 250* 226*  BILITOT 1.3* 1.6* 1.0  PROT 7.6 7.6 6.6  ALBUMIN 3.2* 3.1* 2.7*   CBC: Recent Labs  Lab 11/24/20 1807 11/25/20 0032 11/25/20 0258 11/26/20 0233  WBC 6.7  --  7.3 7.2  NEUTROABS 5.2  --  5.4 5.6  HGB 9.5* 10.5* 9.9* 9.0*  HCT 30.9* 31.0* 31.9* 29.4*  MCV 87.3  --  87.6 87.2  PLT 352  --  357 326   Blood Culture    Component Value Date/Time   SDES BLOOD BLOOD RIGHT  FOREARM 11/24/2020 1955   SPECREQUEST  11/24/2020 1955    BOTTLES DRAWN AEROBIC AND ANAEROBIC Blood Culture adequate volume   CULT  11/24/2020 1955    NO GROWTH 2 DAYS Performed at [facility], [address], [place], [place] [address]    REPTSTATUS PENDING 11/24/2020 1955    CBG: Recent Labs  Lab 11/24/20 2357 11/25/20 1238 11/25/20 1702 11/25/20 2133 11/26/20 0620  GLUCAP 159* 184* 152* 155* 243*   Studies/Results: DG CHEST PORT 1 VIEW  Result Date: 11/25/2020 CLINICAL DATA:  Acute respiratory failure with hypoxia EXAM: PORTABLE CHEST 1 VIEW COMPARISON:  Nov 24, 2020 FINDINGS: Right chest wall dual lumen catheter with tip at the cavoatrial junction. Similar cardiac enlargement. Central vascular congestion. Diffuse interstitial opacities. Fluid in the bilateral fissures. No visible pneumothorax. Osseous structures are unchanged. IMPRESSION: Similar cardiac enlargement with central vascular congestion, probable trace bilateral pleural effusions and mild interstitial edema. Electronically Signed   By: Dahlia Bailiff MD   On: 11/25/2020 01:11   DG Chest Port 1 [address]  Result Date: 11/24/2020 CLINICAL DATA:  Shortness of breath. EXAM: PORTABLE CHEST 1 VIEW COMPARISON:  08/09/2020 FINDINGS: There is a right chest wall dual lumen catheter with tip at the cavoatrial junction. Stable cardiac enlargement. No pleural effusion or edema. No airspace densities identified. Visualized osseous structures are unremarkable. IMPRESSION: 1. Cardiac enlargement 2. No acute abnormality. Electronically Signed   By: Kerby Moors M.D.   On: 11/24/2020 20:07   DG Foot Complete Right  Result Date: 11/24/2020 CLINICAL DATA:  Recent toe injury. EXAM: RIGHT FOOT COMPLETE - 3+ VIEW COMPARISON:  None. FINDINGS: Remote fracture involving the distal aspect of the fifth metatarsal bone identified. There is mild diffuse soft tissue swelling. No underlying acute or dislocation fracture. No focal bone erosions.  Vascular calcifications noted. IMPRESSION: 1. No acute osseous findings. 2. Soft tissue swelling 3. Remote fracture involving the distal aspect of the fifth metatarsal bone. Electronically Signed   By: Kerby Moors M.D.   On: 11/24/2020 19:13   VAS [place] ABI WITH/WO TBI  Result Date: 11/25/2020  LOWER EXTREMITY DOPPLER STUDY Patient Name:  JORDAAN CURET  Date of Exam:   11/25/2020 Medical Rec #: [id number]        Accession #:    [id number] Date of Birth: 06-22-1986        Patient Gender: M Patient Age:   32Y Exam Location:  [facility] Procedure:      VAS [place] ABI WITH/WO TBI Referring Phys: [id number] ANASTASSIA DOUTOVA --------------------------------------------------------------------------------  Indications: DM foot ulcer - RLE 1st and 3rd toes. High Risk Factors: Hypertension, hyperlipidemia, Diabetes, no history of                    smoking. Other Factors: ESRD/HD.  Vascular Interventions: LUE- AVF. Performing Technologist: Rogelia Rohrer RVT/RDMS  Examination Guidelines: A complete evaluation includes at minimum, Doppler waveform signals and systolic blood pressure reading at the level of bilateral brachial, anterior tibial, and posterior tibial arteries, when vessel segments are accessible. Bilateral testing is considered an integral part of a complete examination. Photoelectric Plethysmograph (PPG) waveforms and toe systolic pressure readings are included as required and additional duplex testing as needed. Limited examinations for reoccurring indications may be performed as noted.  ABI Findings: +---------+------------------+-----+---------+--------+ Right    Rt Pressure (mmHg)IndexWaveform Comment  +---------+------------------+-----+---------+--------+ Brachial 166                    triphasic         +---------+------------------+-----+---------+--------+ PTA                             triphasic>254     +---------+------------------+-----+---------+--------+ DP                               triphasic>254     +---------+------------------+-----+---------+--------+ Lona Millard               1.13 Normal            +---------+------------------+-----+---------+--------+ +---------+------------------+-----+---------+--------+ Left     Lt Pressure (mmHg)IndexWaveform Comment  +---------+------------------+-----+---------+--------+ Brachial                                 AVF- DIA +---------+------------------+-----+---------+--------+ PTA  triphasic>254     +---------+------------------+-----+---------+--------+ DP       190               1.14 triphasic         +---------+------------------+-----+---------+--------+ Jamas Lav               1.19 Normal            +---------+------------------+-----+---------+--------+  Summary: Right: Resting right ankle-brachial index indicates noncompressible right lower extremity arteries. The right toe-brachial index is normal. ABIs are unreliable. RT great toe pressure = 187 mmHg. Left: Resting left ankle-brachial index is within normal range. No evidence of significant left lower extremity arterial disease. The left toe-brachial index is normal. ABIs are unreliable. LT Great toe pressure = 197 mmHg.  *See table(s) above for measurements and observations.  Electronically signed by Deitra Mayo MD on 11/25/2020 at 1:05:56 PM.    Final     Medications: . sodium chloride    . sodium chloride    . sodium chloride     . atorvastatin  40 mg Oral Daily  . carvedilol  6.25 mg Oral BID WC  . Chlorhexidine Gluconate Cloth  6 each Topical Q0600  . Chlorhexidine Gluconate Cloth  6 each Topical Q0600  . heparin injection (subcutaneous)  5,000 Units Subcutaneous Q8H  . heparin sodium (porcine)  1,000 Units Intravenous Q T,Th,Sa-HD  . insulin aspart  0-5 Units Subcutaneous QHS  . insulin aspart  0-6 Units Subcutaneous TID WC  . insulin detemir  10 Units Subcutaneous Daily  . living  well with diabetes book   Does not apply Once  . losartan  50 mg Oral Daily  . saccharomyces boulardii  250 mg Oral BID  . sevelamer carbonate  800 mg Oral TID WC  . sodium chloride flush  3 mL Intravenous Q12H  . torsemide  40 mg Oral Once per day on Sun Mon Wed Fri Sat    Dialysis Orders: MWF - SW  4hrs, BFR 450, DFR AF 1.5,  EDW 92kg, 2K/ 2Ca  Access: TDC, LU AVF  Heparin none Mircera 225 mcg q2wks - last 258mg on 5/2 Venofer '50mg'$  qwk [name] 346m IV qHD    Assessment/Plan: 1.  Hyperkalemia - Resolved with lokelma and HD.  2. Blurred vision - ongoing, he believes it is worsening. Likely related to HTN emergency.  Needs to see ophthalmology at some point.  3. Foot ulcer - ABX started.  No evidence of osteomyelitis on xray.  Per primary.  4.  ESRD -  On HD MWF. HD today per regular schedule using LU AVF.  5.  Hypertension - BP elevated on admit, has been out of meds.   Stressed compliance. Currently on carvedilol 6.'25mg'$  BID, losartan '50mg'$  qd, torsemide '40mg'$  qd and prn labetalol. Now improving with dialysis. Continue to follow.  6. Volume - CXR 5/17 with interstitial edema.  EDW lowered at OP HD.  Will challenge tomorrow with HD. Under EDW if weights correct pre HD, will need lower weight on d/c.  7.  Anemia of CKD - Hgb 9.0  ESA ordered with HD today.  8.  Secondary Hyperparathyroidism -  CCa at goal.  Phos elevated.  Continue binders and VDRA. 9.  Nutrition - Renal diet w/fluid restrictions.  10.  DMT2 - per primary   LiJen MowPA-C CaMuncy/18/2022,10:26 AM  LOS: 2 days

## 2020-11-26 NOTE — Progress Notes (Signed)
Initial Nutrition Assessment  DOCUMENTATION CODES:   Not applicable  INTERVENTION:    [name] Shake po TID each supplement provides 425 kcal and 19 grams protein  Renal MVI daily   NUTRITION DIAGNOSIS:   Increased nutrient needs related to chronic illness (ESRD/HD) as evidenced by estimated needs.  GOAL:   Patient will meet greater than or equal to 90% of their needs  MONITOR:   PO intake,Supplement acceptance,Weight trends,Labs,I & O's  REASON FOR ASSESSMENT:   Consult Wound healing  ASSESSMENT:   Patient with PMH significant for ESRD on HD, DM, horseshoe kidney, and HTN. Presents this admission volume overload after missed HD treatment and R foot wound.   Patient unavailable upon RD visit. Will need to obtain nutrition/weight history if possible. Last two meal completions charted as 100%. RD to provide supplementation to maximize kcal and protein this admission.   Noted patient under his dry weight. Will need to obtain NFPE to assess for malnutrition.   EDW: 92 kg  Current weight: 88.3 kg   Medications: aranesp, SS novolog, levemir, renvela, demadex Labs: Na 134 (L) Phosphorus 7.5 (H) CBG 113-243  Diet Order:   Diet Order            Diet renal/carb modified with fluid restriction Diet-HS Snack? Nothing; Fluid restriction: 1200 mL Fluid; Room service appropriate? Yes; Fluid consistency: Thin  Diet effective now                 EDUCATION NEEDS:   Not appropriate for education at this time  Skin:  Skin Assessment: Skin Integrity Issues: Skin Integrity Issues:: Other (Comment) Other: R foot wound  Last BM:  5/17  Height:   Ht Readings from Last 1 Encounters:  11/24/20 6' (1.829 m)    Weight:   Wt Readings from Last 1 Encounters:  11/26/20 88.3 kg    BMI:  Body mass index is 26.4 kg/m.  Estimated Nutritional Needs:   Kcal:  2300-2500 kcal  Protein:  115-130 grams  Fluid:  1000 ml + UOP  Mariana Single RD, LDN Clinical Nutrition Pager  listed in [place]

## 2020-11-26 NOTE — Progress Notes (Signed)
Pt received from HD. VSS. Call light in reach.  Mesiah Manzo, RN  

## 2020-11-27 ENCOUNTER — Other Ambulatory Visit: Payer: Self-pay

## 2020-11-27 LAB — CBC
HCT: 30.7 % — ABNORMAL LOW (ref 39.0–52.0)
Hemoglobin: 9.7 g/dL — ABNORMAL LOW (ref 13.0–17.0)
MCH: 26.7 pg (ref 26.0–34.0)
MCHC: 31.6 g/dL (ref 30.0–36.0)
MCV: 84.6 fL (ref 80.0–100.0)
Platelets: 358 10*3/uL (ref 150–400)
RBC: 3.63 MIL/uL — ABNORMAL LOW (ref 4.22–5.81)
RDW: 19.9 % — ABNORMAL HIGH (ref 11.5–15.5)
WBC: 5.5 10*3/uL (ref 4.0–10.5)
nRBC: 0 % (ref 0.0–0.2)

## 2020-11-27 LAB — BASIC METABOLIC PANEL
Anion gap: 12 (ref 5–15)
BUN: 47 mg/dL — ABNORMAL HIGH (ref 6–20)
CO2: 25 mmol/L (ref 22–32)
Calcium: 8 mg/dL — ABNORMAL LOW (ref 8.9–10.3)
Chloride: 98 mmol/L (ref 98–111)
Creatinine, Ser: 8.03 mg/dL — ABNORMAL HIGH (ref 0.61–1.24)
GFR, Estimated: 8 mL/min — ABNORMAL LOW (ref >60)
Glucose, Bld: 209 mg/dL — ABNORMAL HIGH (ref 70–99)
Potassium: 4.6 mmol/L (ref 3.5–5.1)
Sodium: 135 mmol/L (ref 135–145)

## 2020-11-27 LAB — GLUCOSE, CAPILLARY
Glucose-Capillary: 243 mg/dL — ABNORMAL HIGH (ref 70–99)
Glucose-Capillary: 180 mg/dL — ABNORMAL HIGH (ref 70–99)

## 2020-11-27 LAB — MAGNESIUM: Magnesium: 2.2 mg/dL (ref 1.7–2.4)

## 2020-11-27 MED ORDER — CHLORHEXIDINE GLUCONATE CLOTH 2 % EX PADS: 6.0000 | MEDICATED_PAD | Freq: Every day | CUTANEOUS | Status: DC

## 2020-11-27 MED ORDER — RENA-VITE PO TABS
1.0000 | ORAL_TABLET | Freq: Every day | ORAL | 0 refills | Status: DC
  Filled 2020-11-27: qty 30, 30d supply, fill #0

## 2020-11-27 MED ORDER — HYDRALAZINE HCL 25 MG PO TABS
25.0000 mg | ORAL_TABLET | Freq: Three times a day (TID) | ORAL | 0 refills | Status: DC
  Filled 2020-11-27: qty 90, 30d supply, fill #0

## 2020-11-27 MED ORDER — CARVEDILOL 12.5 MG PO TABS
12.5000 mg | ORAL_TABLET | Freq: Two times a day (BID) | ORAL | 0 refills | Status: DC
  Filled 2020-11-27: qty 60, 30d supply, fill #0

## 2020-11-27 MED ORDER — NEPRO/CARBSTEADY PO LIQD
237.0000 mL | Freq: Three times a day (TID) | ORAL | 0 refills | Status: DC
  Filled 2020-11-27: qty 10000, 14d supply, fill #0

## 2020-11-27 MED ORDER — CARVEDILOL 12.5 MG PO TABS
12.5000 mg | ORAL_TABLET | Freq: Two times a day (BID) | ORAL | Status: DC
  Administered 2020-11-27: 12.5 mg via ORAL
  Filled 2020-11-27: qty 1

## 2020-11-27 MED ORDER — HYDROXYZINE HCL 25 MG PO TABS
25.0000 mg | ORAL_TABLET | Freq: Three times a day (TID) | ORAL | 0 refills | Status: DC | PRN
  Filled 2020-11-27: qty 30, 10d supply, fill #0

## 2020-11-27 MED ORDER — HYDRALAZINE HCL 25 MG PO TABS
25.0000 mg | ORAL_TABLET | Freq: Three times a day (TID) | ORAL | Status: DC
  Administered 2020-11-27: 25 mg via ORAL
  Filled 2020-11-27 (×2): qty 1

## 2020-11-27 NOTE — Discharge Summary (Signed)
[facility] Discharge Summary   Patient: [name] VWP:[id number]  PCP: Gildardo Pounds, NP  Date of admission: 11/24/2020   Date of discharge:  11/27/2020     Discharge Diagnoses:  Principal diagnosis Hypertensive urgency  Active Problems:   Essential hypertension   DM2 (diabetes mellitus, type 2) ([place])   Anemia of chronic kidney failure   Hyperkalemia   Diabetic foot ulcer ([place])   Acute respiratory failure with hypoxia ([place])   Admitted From: Home Disposition:  Home   Recommendations for Outpatient Follow-up:  1. PCP: Follow-up with PCP in 1 week. 2. Follow-up with ophthalmology on 11/28/2020 for slit-lamp examination. 3. Follow up LABS/TEST: None 4. New Meds: Hydralazine 5. Changed meds: Coreg dose increased 6. Stopped meds: None   Follow-up Information    Gildardo Pounds, NP. Schedule an appointment as soon as possible for a visit in 1 week(s).   Specialty: Nurse Practitioner Contact information: [place] [address] [phone]        Katy Apo, MD. Daphane Shepherd on 11/28/2020.   Specialty: Ophthalmology Why: call office to schedule appointment Contact information: [place]  [phone]              Discharge Instructions    Diet - low sodium heart healthy   Complete by: As directed    Increase activity slowly   Complete by: As directed    No wound care   Complete by: As directed       Diet recommendation: Renal diet  Activity: The patient is advised to gradually reintroduce usual activities, as tolerated  Discharge Condition: stable  Code Status: Full code   History of present illness: As per the H and P dictated on admission, "[name] is a 35 y.o. male with medical history significant of ESRD HD on Monday Wednesday Friday, HLD, DM2, Anemia of chronic disease,     Presented with blurry vision that started for the past 4 days ago. Last hemodialysis was on Friday did not have 1  done today because he was sitting in the waiting room since 3 AM and fell asleep so he did not wake up when his name was called.Marland Kitchen Has been off of his blood pressure medications for months. Also reports he had injured his toe now complaining of some drainage since yesterday. Patient reports that he ran out of his medications and has not been taking them he did get finally his medications refilled but has not taken them because when he did try it made him nauseous so he does not want to take it anymore He is not on any insulin either anymore In January patient progressed from CKD to needing hemodialysis.  History of noncompliance and not following.  Was discharged on the second for February with plan to continue hemodialysis Monday Wednesday Friday and continue oral torsemide He has significant hypertension is poorly controlled for which he is supposed to be on Coreg losartan He could not tolerate in the past amlodipine or hydralazine. Longstanding history of anemia secondary to chronic kidney disease with low iron have had iron infusion by nephrology in the past hemoglobin dropped as low as 6.8 needing 1 unit blood transfusion 8"  Hospital Course:  Summary of his active problems in the hospital is as following.   Volume overload ESRD on HD M/W/F,oliguric Hyperkalemia Chest x-ray showed mild interstitial edema Missed HD on 11/24/2020 Nephrology consulted tolerating HD. Per nephrology patient can be discharged from their  perspective and continue HD outpatient.  Hypertensive crisis Likely 2/2 noncompliance with meds Continue Coreg,losartan,torsemide (on non HD days),and hydralazine started by nephrology.  Blurry vision, peripheral vision loss Likely 2/2 possibly uncontrolled hypertension No other focal neurologic deficits noted Patient reports persistent blurry vision despite improvement in blood pressure.  CT head unremarkable. Differential include hypertensive retinopathy, open-angle  glaucoma, pres like syndrome in the setting of hypertension. Although blood pressure is improving and the patient's vision is not improving therefore press leg syndrome is less likely. Discussed with ophthalmology highly appreciate their assistance. patient will require slit-lamp examination outpatient follow-up recommended.  Right foot ulcers Noted ulcer/wound on right great toe, andthird toe, no pusdrainage noted,erythema noted,does not appear overly infected WOC consulted, healing well.  Outpatient follow-up.  No significant dressing requirement. Patient was on ceftriaxone, Flagyl, vancomycin we will discontinue  Diabetes mellitus type 2 Last A1c7.2 Continue SSI, Levemir, Accu-Cheks, hypoglycemic protocol  Anemia of chronic kidney disease Hemoglobin at baseline Daily CBC  Body mass index is 26.4 kg/m.  Nutrition Problem: Increased nutrient needs Etiology: chronic illness (ESRD/HD) Nutrition Interventions: Interventions: [name] shake,MVI      Patient was ambulatory without any assistance. On the day of the discharge the patient's vitals were stable, and no other acute medical condition were reported by patient. The patient was felt safe to be discharge at Home with no therapy needed on discharge.  Consultants: Nephrology, phone consultation with ophthalmology Procedures: HD  DISCHARGE MEDICATION: Allergies as of 11/27/2020      Reactions   Amlodipine Nausea And Vomiting, Other (See Comments)   Patient was taking Amlodipine and Hydralazine at the same time, so the reactions came from one of the 2: Lethargy and an all-over feeling of NOT feeling well (also)   Hydralazine Nausea And Vomiting, Other (See Comments)   Patient was taking Hydralazine AND Amlodipine at the same time, so the reactions came from one of the 2: Lethargy and an all-over feeling of NOT feeling well (also)      Medication List    STOP taking these medications   True Metrix Blood Glucose Test test  strip Generic drug: glucose blood     TAKE these medications   atorvastatin 40 MG tablet Commonly known as: LIPITOR Take 1 tablet (40 mg total) by mouth daily.   atorvastatin 40 MG tablet Commonly known as: LIPITOR TAKE 1 TABLET (40 MG TOTAL) BY MOUTH DAILY.   carvedilol 12.5 MG tablet Commonly known as: COREG Take 1 tablet (12.5 mg total) by mouth 2 (two) times daily with a meal. What changed:   medication strength  how much to take   feeding supplement ([name] CARB STEADY) Liqd Take 237 mLs by mouth 3 (three) times daily between meals.   hydrALAZINE 25 MG tablet Commonly known as: APRESOLINE Take 1 tablet (25 mg total) by mouth 3 (three) times daily.   hydrOXYzine 25 MG tablet Commonly known as: ATARAX/VISTARIL Take 1 tablet (25 mg total) by mouth 3 (three) times daily as needed for itching.   imipramine 25 MG tablet Commonly known as: TOFRANIL TAKE 1 TABLET (25 MG TOTAL) BY MOUTH AT BEDTIME. FOR DEPRESSION What changed: Another medication with the same name was removed. Continue taking this medication, and follow the directions you see here.   insulin detemir 100 UNIT/ML injection Commonly known as: LEVEMIR Inject 0.15 mLs (15 Units total) into the skin daily. What changed: how much to take   losartan 50 MG tablet Commonly known as: COZAAR TAKE 1 TABLET (50  MG TOTAL) BY MOUTH DAILY.   multivitamin Tabs tablet Take 1 tablet by mouth at bedtime.   sevelamer carbonate 800 MG tablet Commonly known as: RENVELA TAKE 1 TABLET (800 MG TOTAL) BY MOUTH THREE TIMES DAILY WITH MEALS.   sevelamer carbonate 800 MG tablet Commonly known as: RENVELA Take 2 tablets (1,600 mg total) by mouth 3 (three) times daily with meals   torsemide 20 MG tablet Commonly known as: DEMADEX TAKE 2 TABLETS (40 MG) DAILY ON NON HEMODIALYSIS DAYS   True Metrix Meter w/Device Kit Use as instructed. Check blood glucose level by fingerstick once per day.   TRUEplus Lancets 28G Misc Use as  instructed. Check blood glucose level by fingerstick once per day.   TRUEplus Lancets 28G Misc USE AS INSTRUCTED. CHECK BLOOD GLUCOSE LEVEL BY FINGERSTICK ONCE PER DAY.       Discharge Exam: Filed Weights   11/26/20 0840 11/26/20 1210  Weight: 91.5 kg 88.3 kg   Vitals:   11/27/20 0759 11/27/20 1150  BP: (!) 156/103 (!) 157/98  Pulse: 78 78  Resp: 16 18  Temp: 99.2 F (37.3 C) 98.7 F (37.1 C)  SpO2: 100% 100%   General: Appear in mild distress, no Rash; Oral Mucosa Clear, moist. no Abnormal Neck Mass Or lumps, Conjunctiva normal  Cardiovascular: S1 and S2 Present, no Murmur, Respiratory: good respiratory effort, Bilateral Air entry present and CTA, no Crackles, no wheezes Abdomen: Bowel Sound present, Soft and no tenderness Extremities: no Pedal edema Neurology: alert and oriented to time, place, and person affect appropriate. no new focal deficit stable blurred vision.  No other focal deficit. Gait not checked due to patient safety concerns   The results of significant diagnostics from this hospitalization (including imaging, microbiology, ancillary and laboratory) are listed below for reference.    Significant Diagnostic Studies: CT HEAD WO CONTRAST  Result Date: 11/26/2020 CLINICAL DATA:  [place]. EXAM: CT HEAD WITHOUT CONTRAST TECHNIQUE: Contiguous axial images were obtained from the base of the skull through the vertex without intravenous contrast. COMPARISON:  None. FINDINGS: Brain: There is no evidence of an acute infarct, intracranial hemorrhage, mass, midline shift, or extra-axial fluid collection. The ventricles and sulci are normal. Vascular: No hyperdense vessel. Skull: No fracture or suspicious osseous lesion. Sinuses/Orbits: Visualized paranasal sinuses and mastoid air cells are clear. Unremarkable orbits. Other: Nonspecific thickening of the skin and subcutaneous soft tissues in the suboccipital region near the origin of the nuchal ligament. IMPRESSION:  Unremarkable CT appearance of the brain. Electronically Signed   By: Logan Bores M.D.   On: 11/26/2020 16:39   DG CHEST PORT 1 VIEW  Result Date: 11/25/2020 CLINICAL DATA:  Acute respiratory failure with hypoxia EXAM: PORTABLE CHEST 1 VIEW COMPARISON:  Nov 24, 2020 FINDINGS: Right chest wall dual lumen catheter with tip at the cavoatrial junction. Similar cardiac enlargement. Central vascular congestion. Diffuse interstitial opacities. Fluid in the bilateral fissures. No visible pneumothorax. Osseous structures are unchanged. IMPRESSION: Similar cardiac enlargement with central vascular congestion, probable trace bilateral pleural effusions and mild interstitial edema. Electronically Signed   By: Dahlia Bailiff MD   On: 11/25/2020 01:11   DG Chest Port 1 View  Result Date: 11/24/2020 CLINICAL DATA:  Shortness of breath. EXAM: PORTABLE CHEST 1 VIEW COMPARISON:  08/09/2020 FINDINGS: There is a right chest wall dual lumen catheter with tip at the cavoatrial junction. Stable cardiac enlargement. No pleural effusion or edema. No airspace densities identified. Visualized osseous structures are unremarkable. IMPRESSION: 1. Cardiac enlargement 2.  No acute abnormality. Electronically Signed   By: Kerby Moors M.D.   On: 11/24/2020 20:07   DG Foot Complete Right  Result Date: 11/24/2020 CLINICAL DATA:  Recent toe injury. EXAM: RIGHT FOOT COMPLETE - 3+ VIEW COMPARISON:  None. FINDINGS: Remote fracture involving the distal aspect of the fifth metatarsal bone identified. There is mild diffuse soft tissue swelling. No underlying acute or dislocation fracture. No focal bone erosions. Vascular calcifications noted. IMPRESSION: 1. No acute osseous findings. 2. Soft tissue swelling 3. Remote fracture involving the distal aspect of the fifth metatarsal bone. Electronically Signed   By: Kerby Moors M.D.   On: 11/24/2020 19:13   VAS [place] ABI WITH/WO TBI  Result Date: 11/25/2020  LOWER EXTREMITY DOPPLER STUDY Patient  Name:  [name]  Date of Exam:   11/25/2020 Medical Rec #: [id number]        Accession #:    [id number] Date of Birth: [birth year]        Patient Gender: M Patient Age:   72Y Exam Location:  [facility] Procedure:      VAS [place] ABI WITH/WO TBI Referring Phys: [id number] ANASTASSIA DOUTOVA --------------------------------------------------------------------------------  Indications: DM foot ulcer - RLE 1st and 3rd toes. High Risk Factors: Hypertension, hyperlipidemia, Diabetes, no history of                    smoking. Other Factors: ESRD/HD.  Vascular Interventions: LUE- AVF. Performing Technologist: Rogelia Rohrer RVT/RDMS  Examination Guidelines: A complete evaluation includes at minimum, Doppler waveform signals and systolic blood pressure reading at the level of bilateral brachial, anterior tibial, and posterior tibial arteries, when vessel segments are accessible. Bilateral testing is considered an integral part of a complete examination. Photoelectric Plethysmograph (PPG) waveforms and toe systolic pressure readings are included as required and additional duplex testing as needed. Limited examinations for reoccurring indications may be performed as noted.  ABI Findings: +---------+------------------+-----+---------+--------+ Right    Rt Pressure (mmHg)IndexWaveform Comment  +---------+------------------+-----+---------+--------+ Brachial 166                    triphasic         +---------+------------------+-----+---------+--------+ PTA                             triphasic>254     +---------+------------------+-----+---------+--------+ DP                              triphasic>254     +---------+------------------+-----+---------+--------+ Great Toe187               1.13 Normal            +---------+------------------+-----+---------+--------+ +---------+------------------+-----+---------+--------+ Left     Lt Pressure (mmHg)IndexWaveform Comment   +---------+------------------+-----+---------+--------+ Brachial                                 AVF- DIA +---------+------------------+-----+---------+--------+ PTA                             triphasic>254     +---------+------------------+-----+---------+--------+ DP       190               1.14 triphasic         +---------+------------------+-----+---------+--------+ Jamas Lav  1.19 Normal            +---------+------------------+-----+---------+--------+  Summary: Right: Resting right ankle-brachial index indicates noncompressible right lower extremity arteries. The right toe-brachial index is normal. ABIs are unreliable. RT great toe pressure = 187 mmHg. Left: Resting left ankle-brachial index is within normal range. No evidence of significant left lower extremity arterial disease. The left toe-brachial index is normal. ABIs are unreliable. LT Great toe pressure = 197 mmHg.  *See table(s) above for measurements and observations.  Electronically signed by Deitra Mayo MD on 11/25/2020 at 1:05:56 PM.    Final     Microbiology: Recent Results (from the past 240 hour(s))  Blood culture (routine x 2)     Status: None (Preliminary result)   Collection Time: 11/24/20  7:30 PM   Specimen: BLOOD  Result Value Ref Range Status   Specimen Description BLOOD RIGHT ANTECUBITAL  Final   Special Requests   Final    BOTTLES DRAWN AEROBIC AND ANAEROBIC Blood Culture adequate volume   Culture   Final    NO GROWTH 3 DAYS Performed at [facility], [address] [place], [place] [address]    Report Status PENDING  Incomplete  Resp Panel by RT-PCR (Flu A&B, Covid) Nasopharyngeal Swab     Status: None   Collection Time: 11/24/20  7:41 PM   Specimen: Nasopharyngeal Swab; Nasopharyngeal(NP) swabs in vial transport medium  Result Value Ref Range Status   SARS Coronavirus 2 by RT PCR NEGATIVE NEGATIVE Final    Comment: (NOTE) SARS-CoV-2 target nucleic acids are  NOT DETECTED.  The SARS-CoV-2 RNA is generally detectable in upper respiratory specimens during the acute phase of infection. The lowest concentration of SARS-CoV-2 viral copies this assay can detect is 138 copies/mL. A negative result does not preclude SARS-Cov-2 infection and should not be used as the sole basis for treatment or other patient management decisions. A negative result may occur with  improper specimen collection/handling, submission of specimen other than nasopharyngeal swab, presence of viral mutation(s) within the areas targeted by this assay, and inadequate number of viral copies(<138 copies/mL). A negative result must be combined with clinical observations, patient history, and epidemiological information. The expected result is Negative.  Fact Sheet for Patients:  EntrepreneurPulse.com.au  Fact Sheet for Healthcare Providers:  IncredibleEmployment.be  This test is no t yet approved or cleared by the [place] FDA and  has been authorized for detection and/or diagnosis of SARS-CoV-2 by FDA under an Emergency Use Authorization (EUA). This EUA will remain  in effect (meaning this test can be used) for the duration of the COVID-19 declaration under Section 564(b)(1) of the Act, 21 U.S.C.section 360bbb-3(b)(1), unless the authorization is terminated  or revoked sooner.       Influenza A by PCR NEGATIVE NEGATIVE Final   Influenza B by PCR NEGATIVE NEGATIVE Final    Comment: (NOTE) The Xpert Xpress SARS-CoV-2/FLU/RSV plus assay is intended as an aid in the diagnosis of influenza from Nasopharyngeal swab specimens and should not be used as a sole basis for treatment. Nasal washings and aspirates are unacceptable for Xpert Xpress SARS-CoV-2/FLU/RSV testing.  Fact Sheet for Patients: EntrepreneurPulse.com.au  Fact Sheet for Healthcare Providers: IncredibleEmployment.be  This test is not  yet approved or cleared by the [place] FDA and has been authorized for detection and/or diagnosis of SARS-CoV-2 by FDA under an Emergency Use Authorization (EUA). This EUA will remain in effect (meaning this test can be used) for the duration of the  COVID-19 declaration under Section 564(b)(1) of the Act, 21 U.S.C. section 360bbb-3(b)(1), unless the authorization is terminated or revoked.  Performed at [facility], [place] [address], [place], [place] [address]   Blood culture (routine x 2)     Status: None (Preliminary result)   Collection Time: 11/24/20  7:55 PM   Specimen: BLOOD  Result Value Ref Range Status   Specimen Description BLOOD BLOOD RIGHT FOREARM  Final   Special Requests   Final    BOTTLES DRAWN AEROBIC AND ANAEROBIC Blood Culture adequate volume   Culture   Final    NO GROWTH 3 DAYS Performed at [facility], [place] [address], [place], [place] [address]    Report Status PENDING  Incomplete  MRSA PCR Screening     Status: None   Collection Time: 11/24/20 11:59 PM   Specimen: Nasal Mucosa; Nasopharyngeal  Result Value Ref Range Status   MRSA by PCR NEGATIVE NEGATIVE Final    Comment:        The GeneXpert MRSA Assay (FDA approved for NASAL specimens only), is one component of a comprehensive MRSA colonization surveillance program. It is not intended to diagnose MRSA infection nor to guide or monitor treatment for MRSA infections. Performed at [facility], [place] [address] [place], [place] [address]      Labs: CBC: Recent Labs  Lab 11/24/20 1807 11/25/20 0032 11/25/20 0258 11/26/20 0233 11/27/20 0525  WBC 6.7  --  7.3 7.2 5.5  NEUTROABS 5.2  --  5.4 5.6  --   HGB 9.5* 10.5* 9.9* 9.0* 9.7*  HCT 30.9* 31.0* 31.9* 29.4* 30.7*  MCV 87.3  --  87.6 87.2 84.6  PLT 352  --  357 326 559   Basic Metabolic Panel: Recent Labs  Lab 11/24/20 1807 11/25/20 0006 11/25/20 0032 11/25/20 0258 11/26/20 0233 11/27/20 0525  NA 138 136 137  137 134* 135  K 6.3* 6.5* 6.6* 5.8* 4.8 4.6  CL 100 97*  --  98 97* 98  CO2 25 24  --  _0 GLUCOSE 120* 156*  --  120* 199* 209*  BUN 84* 86*  --  86* 54* 47*  CREATININE 12.60* 13.26*  --  13.35* 9.01* 8.03*  CALCIUM 8.4* 8.1*  --  8.1* 7.9* 8.0*  MG  --   --   --  2.6*  --  2.2  PHOS  --   --   --  7.5*  --   --    Liver Function Tests: Recent Labs  Lab 11/24/20 1807 11/25/20 0258 11/26/20 0233  AST _1 ALT 58* 56* 44  ALKPHOS 243* 250* 226*  BILITOT 1.3* 1.6* 1.0  PROT 7.6 7.6 6.6  ALBUMIN 3.2* 3.1* 2.7*   CBG: Recent Labs  Lab 11/26/20 1251 11/26/20 1650 11/26/20 2108 11/27/20 0601 11/27/20 1147  GLUCAP 150* 203* 126* 243* 180*    Time spent: 35 minutes  Signed:  Berle Mull  [facility]  11/27/2020 7:51 PM

## 2020-11-27 NOTE — Progress Notes (Addendum)
[name] Progress Note   Subjective:   Patient seen and examined at bedside.  Denies foot pain, eye pain,  CP, SOB, orthopnea, n/v/d, abdominal pain and fatigue.  No changes in vision.   Objective Vitals:   11/26/20 1943 11/26/20 2317 11/27/20 0429 11/27/20 0759  BP: (!) 144/99 126/76 (!) 150/99 (!) 156/103  Pulse: 78 77 77 78  Resp: '16 13 17 16  '$ Temp: 98.9 F (37.2 C) 98.4 F (36.9 C) 98.9 F (37.2 C) 99.2 F (37.3 C)  TempSrc: Oral Oral Oral Oral  SpO2: 100% 97% 97% 100%  Weight:       Physical Exam General: well appearing male in NAD Heart:RRR, no mrg Lungs:CTAB, nml WOB Abdomen:soft, NTND Extremities:trace LE edema, +ulcers on multiple toes on R foot Dialysis Access: LU AVF +b/t, [facility]    Filed Weights   11/26/20 0840 11/26/20 1210  Weight: 91.5 kg 88.3 kg    Intake/Output Summary (Last 24 hours) at 11/27/2020 1021 Last data filed at 11/27/2020 0817 Gross per 24 hour  Intake 1450 ml  Output 3000 ml  Net -1550 ml    Additional Objective Labs: Basic Metabolic Panel: Recent Labs  Lab 11/25/20 0258 11/26/20 0233 11/27/20 0525  NA 137 134* 135  K 5.8* 4.8 4.6  CL 98 97* 98  CO2 '24 26 25  '$ GLUCOSE 120* 199* 209*  BUN 86* 54* 47*  CREATININE 13.35* 9.01* 8.03*  CALCIUM 8.1* 7.9* 8.0*  PHOS 7.5*  --   --    Liver Function Tests: Recent Labs  Lab 11/24/20 1807 11/25/20 0258 11/26/20 0233  AST '19 20 17  '$ ALT 58* 56* 44  ALKPHOS 243* 250* 226*  BILITOT 1.3* 1.6* 1.0  PROT 7.6 7.6 6.6  ALBUMIN 3.2* 3.1* 2.7*   CBC: Recent Labs  Lab 11/24/20 1807 11/25/20 0032 11/25/20 0258 11/26/20 0233 11/27/20 0525  WBC 6.7  --  7.3 7.2 5.5  NEUTROABS 5.2  --  5.4 5.6  --   HGB 9.5*   < > 9.9* 9.0* 9.7*  HCT 30.9*   < > 31.9* 29.4* 30.7*  MCV 87.3  --  87.6 87.2 84.6  PLT 352  --  357 326 358   < > = values in this interval not displayed.   Blood Culture    Component Value Date/Time   SDES BLOOD BLOOD RIGHT FOREARM 11/24/2020 1955    SPECREQUEST  11/24/2020 1955    BOTTLES DRAWN AEROBIC AND ANAEROBIC Blood Culture adequate volume   CULT  11/24/2020 1955    NO GROWTH 3 DAYS Performed at [facility], [place] [address], [place], [place] [address]    REPTSTATUS PENDING 11/24/2020 1955   CBG: Recent Labs  Lab 11/26/20 0620 11/26/20 1251 11/26/20 1650 11/26/20 2108 11/27/20 0601  GLUCAP 243* 150* 203* 126* 243*   Studies/Results: CT HEAD WO CONTRAST  Result Date: 11/26/2020 CLINICAL DATA:  [place]. EXAM: CT HEAD WITHOUT CONTRAST TECHNIQUE: Contiguous axial images were obtained from the base of the skull through the vertex without intravenous contrast. COMPARISON:  None. FINDINGS: Brain: There is no evidence of an acute infarct, intracranial hemorrhage, mass, midline shift, or extra-axial fluid collection. The ventricles and sulci are normal. Vascular: No hyperdense vessel. Skull: No fracture or suspicious osseous lesion. Sinuses/Orbits: Visualized paranasal sinuses and mastoid air cells are clear. Unremarkable orbits. Other: Nonspecific thickening of the skin and subcutaneous soft tissues in the suboccipital region near the origin of the nuchal ligament. IMPRESSION: Unremarkable CT appearance of  the brain. Electronically Signed   By: Logan Bores M.D.   On: 11/26/2020 16:39   VAS [place] ABI WITH/WO TBI  Result Date: 11/25/2020  LOWER EXTREMITY DOPPLER STUDY Patient Name:  [name]  Date of Exam:   11/25/2020 Medical Rec #: [id number]        Accession #:    [id number] Date of Birth: [birth year]        Patient Gender: M Patient Age:   77Y Exam Location:  [facility] Procedure:      VAS [place] ABI WITH/WO TBI Referring Phys: [id number] ANASTASSIA DOUTOVA --------------------------------------------------------------------------------  Indications: DM foot ulcer - RLE 1st and 3rd toes. High Risk Factors: Hypertension, hyperlipidemia, Diabetes, no history of                    smoking. Other Factors: ESRD/HD.  Vascular  Interventions: LUE- AVF. Performing Technologist: Rogelia Rohrer RVT/RDMS  Examination Guidelines: A complete evaluation includes at minimum, Doppler waveform signals and systolic blood pressure reading at the level of bilateral brachial, anterior tibial, and posterior tibial arteries, when vessel segments are accessible. Bilateral testing is considered an integral part of a complete examination. Photoelectric Plethysmograph (PPG) waveforms and toe systolic pressure readings are included as required and additional duplex testing as needed. Limited examinations for reoccurring indications may be performed as noted.  ABI Findings: +---------+------------------+-----+---------+--------+ Right    Rt Pressure (mmHg)IndexWaveform Comment  +---------+------------------+-----+---------+--------+ Brachial 166                    triphasic         +---------+------------------+-----+---------+--------+ PTA                             triphasic>254     +---------+------------------+-----+---------+--------+ DP                              triphasic>254     +---------+------------------+-----+---------+--------+ Great Toe187               1.13 Normal            +---------+------------------+-----+---------+--------+ +---------+------------------+-----+---------+--------+ Left     Lt Pressure (mmHg)IndexWaveform Comment  +---------+------------------+-----+---------+--------+ Brachial                                 AVF- DIA +---------+------------------+-----+---------+--------+ PTA                             triphasic>254     +---------+------------------+-----+---------+--------+ DP       190               1.14 triphasic         +---------+------------------+-----+---------+--------+ Great Toe197               1.19 Normal            +---------+------------------+-----+---------+--------+  Summary: Right: Resting right ankle-brachial index indicates noncompressible right  lower extremity arteries. The right toe-brachial index is normal. ABIs are unreliable. RT great toe pressure = 187 mmHg. Left: Resting left ankle-brachial index is within normal range. No evidence of significant left lower extremity arterial disease. The left toe-brachial index is normal. ABIs are unreliable. LT Great toe pressure = 197 mmHg.  *See table(s) above for measurements and observations.  Electronically signed by  Deitra Mayo MD on 11/25/2020 at 1:05:56 PM.    Final     Medications: . sodium chloride    . sodium chloride    . sodium chloride     . atorvastatin  40 mg Oral Daily  . carvedilol  6.25 mg Oral BID WC  . Chlorhexidine Gluconate Cloth  6 each Topical Q0600  . Chlorhexidine Gluconate Cloth  6 each Topical Q0600  . darbepoetin (ARANESP) injection - DIALYSIS  150 mcg Intravenous Q Wed-HD  . feeding supplement ([name] CARB STEADY)  237 mL Oral TID BM  . heparin injection (subcutaneous)  5,000 Units Subcutaneous Q8H  . heparin sodium (porcine)  1,000 Units Intravenous Q T,Th,Sa-HD  . insulin aspart  0-5 Units Subcutaneous QHS  . insulin aspart  0-6 Units Subcutaneous TID WC  . insulin detemir  10 Units Subcutaneous Daily  . living well with diabetes book   Does not apply Once  . losartan  50 mg Oral Daily  . multivitamin  1 tablet Oral QHS  . saccharomyces boulardii  250 mg Oral BID  . sevelamer carbonate  800 mg Oral TID WC  . sodium chloride flush  3 mL Intravenous Q12H  . torsemide  40 mg Oral Once per day on Sun Mon Wed Fri Sat    Dialysis Orders: MWF -SW 4hrs, BFR450, DFRAF 1.5, EDW 92kg,2K/2Ca  Access:TDC, LU AVF Heparinnone Mircera247mg q2wks - last 2082m on 5/2 Venofer '50mg'$  qwk [name]3m16mIV qHD   Assessment/Plan: 1. Hyperkalemia- Resolved with lokelma and HD. 2. Blurred vision- ongoing, he believes it is worsening. Possibly related to HTN, although no improvement with improved BP.  Needs to see ophthalmology at some point.  PMD discussing with them today. 3. Foot ulcer- ABX d/c. No evidence of osteomyelitis on xray. Per primary.  4. ESRD- On HD MWF. HD tomorrow per regular schedue.  Tolerated using AVF yesterday with 16G needles, will try 15G tomorrow. Can be completed at OP if d/c.    5. Hypertension- BP elevated on admit, had been out of meds.  Stressed compliance. BP improved to goal yesterday.  Currently on carvedilol 6.'25mg'$  BID, losartan '50mg'$  qd, torsemide '40mg'$  qd and prn labetalol.  Have ^'d coreg today. Cont to lower volume.  6. Volume - CXR 5/17 with interstitial edema. EDW lowered at OP HD. Continue to challenge and adjust dry weight on d/c.  7. Anemiaof CKD- Hgb 9.7. ESA ordered with HD tomorrow.  8. Secondary Hyperparathyroidism -CCa at goal. Phos elevated. Continue binders and VDRA. 9. Nutrition- Renal diet w/fluid restrictions.  10. DMT2 - per primary    LinJen MowA-C CarHarlan19/2022,10:21 AM  LOS: 3 days   Pt seen, examined and agree w assess/plan as above with additions as indicated.  [facility] 11/27/2020, 11:27 AM

## 2020-11-27 NOTE — TOC Transition Note (Signed)
Transition of Care (TOC) - CM/SW Discharge Note Marvetta Gibbons RN, BSN Transitions of Care Unit 4E- RN Case Manager See Treatment Team for direct phone #    Patient Details  Name: [name] MRN: [id number] Date of Birth: 1986/04/08  [facility]) CM/SW Contact:  Dawayne Patricia, RN Phone Number: 11/27/2020, 3:16 PM   Clinical Narrative:    Pt stable for transition home today, has PCP at [facility]. ESRD with ED-M/W/F. From home, independent. Has Medicaid- drug coverage under plan- no medication needs for discharge.  No TOC needs noted.    Final next level of care: Home/Self Care Barriers to Discharge: No Barriers Identified   Patient Goals and CMS Choice     Choice offered to / list presented to : NA  Discharge Placement                Home        Discharge Plan and Services   Discharge Planning Services: CM Consult Post Acute Care Choice: NA          DME Arranged: N/A DME Agency: NA       HH Arranged: NA HH Agency: NA        Social Determinants of Health (SDOH) Interventions     Readmission Risk Interventions Readmission Risk Prevention Plan 11/27/2020 02/29/2020  Transportation Screening Complete Complete  Home Care Screening - Complete  Medication Review (RN Care Manager) Complete -  PCP or Specialist appointment within 3-5 days of discharge Complete -  [place] or Home Care Consult Complete -  SW Recovery Care/Counseling Consult Complete -  [place] Not Applicable -  Some recent data might be hidden

## 2020-11-28 NOTE — Progress Notes (Signed)
Pt was transferred by wheelchair with a NT staff accompany to a taxi cab. All Pt's belongings in plastic bag and AVS in envelope were given back to Pt by RN day shift. Pt has been stable and no distress before leaving the unit.   Kennyth Lose, RN

## 2020-11-30 LAB — CULTURE, BLOOD (ROUTINE X 2)
Culture: NO GROWTH
Culture: NO GROWTH
Special Requests: ADEQUATE
Special Requests: ADEQUATE

## 2020-12-01 ENCOUNTER — Telehealth: Payer: Self-pay

## 2020-12-01 NOTE — Telephone Encounter (Signed)
Transition Care Management Unsuccessful Follow-up Telephone Call  Date of discharge and from where:  11/27/2020, [facility]   Attempts:  1st Attempt  Reason for unsuccessful TCM follow-up call:  Unable to leave message- # [phone] called twice, the message stated that the call cannot be completed at this time. Call placed to # [phone], the phone just rings, no option for voicemail.  Need to discuss scheduling a follow up appointment with PCP

## 2020-12-02 ENCOUNTER — Telehealth: Payer: Self-pay

## 2020-12-02 NOTE — Telephone Encounter (Signed)
Transition Care Management Unsuccessful Follow-up Telephone Call  Date of discharge and from where:  11/27/2020, [facility]   Attempts:  2nd Attempt  Reason for unsuccessful TCM follow-up call:  Unable to leave message # [phone] and #  [phone]  the recordings stated that the call cannot be completed at this time.  Need to discuss scheduling a follow up appointment with PCP

## 2020-12-03 ENCOUNTER — Telehealth: Payer: Self-pay

## 2020-12-03 NOTE — Telephone Encounter (Signed)
Transition Care Management Unsuccessful Follow-up Telephone Call  Date of discharge and from where:  11/27/2020, [facility]   Attempts:  3rd Attempt  Reason for unsuccessful TCM follow-up call:  Left voice message on # [phone].  Letter sent to patient requesting he call [place] to schedule a follow up appointment

## 2020-12-04 ENCOUNTER — Other Ambulatory Visit: Payer: Self-pay

## 2020-12-23 ENCOUNTER — Encounter (HOSPITAL_COMMUNITY): Payer: Self-pay

## 2020-12-23 ENCOUNTER — Other Ambulatory Visit: Payer: Self-pay

## 2020-12-23 ENCOUNTER — Emergency Department (HOSPITAL_COMMUNITY)
Admission: EM | Admit: 2020-12-23 | Discharge: 2020-12-24 | Disposition: A | Discharge status: Home / Self Care | Payer: Medicaid Other | Attending: Emergency Medicine | Admitting: Emergency Medicine

## 2020-12-23 DIAGNOSIS — Z79899 Other long term (current) drug therapy: Secondary | ICD-10-CM | POA: Insufficient documentation

## 2020-12-23 DIAGNOSIS — N186 End stage renal disease: Secondary | ICD-10-CM | POA: Insufficient documentation

## 2020-12-23 DIAGNOSIS — Z992 Dependence on renal dialysis: Secondary | ICD-10-CM | POA: Diagnosis not present

## 2020-12-23 DIAGNOSIS — I120 Hypertensive chronic kidney disease with stage 5 chronic kidney disease or end stage renal disease: Secondary | ICD-10-CM | POA: Diagnosis not present

## 2020-12-23 DIAGNOSIS — E1122 Type 2 diabetes mellitus with diabetic chronic kidney disease: Secondary | ICD-10-CM | POA: Insufficient documentation

## 2020-12-23 DIAGNOSIS — S91109A Unspecified open wound of unspecified toe(s) without damage to nail, initial encounter: Secondary | ICD-10-CM

## 2020-12-23 DIAGNOSIS — Z794 Long term (current) use of insulin: Secondary | ICD-10-CM | POA: Diagnosis not present

## 2020-12-23 DIAGNOSIS — S99921A Unspecified injury of right foot, initial encounter: Secondary | ICD-10-CM | POA: Diagnosis present

## 2020-12-23 DIAGNOSIS — X58XXXA Exposure to other specified factors, initial encounter: Secondary | ICD-10-CM | POA: Diagnosis not present

## 2020-12-23 DIAGNOSIS — S91101A Unspecified open wound of right great toe without damage to nail, initial encounter: Secondary | ICD-10-CM | POA: Diagnosis not present

## 2020-12-23 LAB — CBC WITH DIFFERENTIAL/PLATELET
Abs Immature Granulocytes: 0.02 10*3/uL (ref 0.00–0.07)
Basophils Absolute: 0 10*3/uL (ref 0.0–0.1)
Basophils Relative: 1 %
Eosinophils Absolute: 0.1 10*3/uL (ref 0.0–0.5)
Eosinophils Relative: 2 %
HCT: 31.1 % — ABNORMAL LOW (ref 39.0–52.0)
Hemoglobin: 9.6 g/dL — ABNORMAL LOW (ref 13.0–17.0)
Immature Granulocytes: 0 %
Lymphocytes Relative: 16 %
Lymphs Abs: 1.1 10*3/uL (ref 0.7–4.0)
MCH: 26.3 pg (ref 26.0–34.0)
MCHC: 30.9 g/dL (ref 30.0–36.0)
MCV: 85.2 fL (ref 80.0–100.0)
Monocytes Absolute: 0.5 10*3/uL (ref 0.1–1.0)
Monocytes Relative: 8 %
Neutro Abs: 4.8 10*3/uL (ref 1.7–7.7)
Neutrophils Relative %: 73 %
Platelets: 342 10*3/uL (ref 150–400)
RBC: 3.65 MIL/uL — ABNORMAL LOW (ref 4.22–5.81)
RDW: 18.2 % — ABNORMAL HIGH (ref 11.5–15.5)
WBC: 6.6 10*3/uL (ref 4.0–10.5)
nRBC: 0 % (ref 0.0–0.2)

## 2020-12-23 LAB — BASIC METABOLIC PANEL
Anion gap: 12 (ref 5–15)
BUN: 37 mg/dL — ABNORMAL HIGH (ref 6–20)
CO2: 30 mmol/L (ref 22–32)
Calcium: 7.9 mg/dL — ABNORMAL LOW (ref 8.9–10.3)
Chloride: 93 mmol/L — ABNORMAL LOW (ref 98–111)
Creatinine, Ser: 8.38 mg/dL — ABNORMAL HIGH (ref 0.61–1.24)
GFR, Estimated: 8 mL/min — ABNORMAL LOW (ref >60)
Glucose, Bld: 217 mg/dL — ABNORMAL HIGH (ref 70–99)
Potassium: 4.2 mmol/L (ref 3.5–5.1)
Sodium: 135 mmol/L (ref 135–145)

## 2020-12-23 NOTE — ED Provider Notes (Signed)
[facility] EMERGENCY DEPARTMENT Provider Note   CSN: [id number] Arrival date & time: 12/23/20  1116     History Chief Complaint  Patient presents with   Foot Swelling    [name] is a 35 y.o. male.  Patient is a 35 year old male with a history of diabetes, hypertension, high cholesterol, end-stage renal disease on dialysis 3 times a week who is presenting today due to a wound on his right foot.  He reports that its been there for a few weeks but he has poor vision and cannot see well.  He initially ran into something which is what started the problem.  It has been bleeding intermittently and the skin has been raw.  He has no pain in the foot.  His mom thinks that his feet have looked swollen but he is going to dialysis regularly.  Patient reports approximately 2 months ago his vision started going and now he just sees shadows and has very poor vision.  Nothing has changed recently.  He has no headaches, eye pain.  He denies any chest pain, shortness of breath.  No recent fevers.  He does report getting a call from the podiatrist and has an appointment with them on Thursday.  He is also following up with the eye doctor next month.  The history is provided by the patient and a parent.      Past Medical History:  Diagnosis Date   Diabetes mellitus    ESRD on hemodialysis ([place])    High cholesterol    Hypertension     Patient Active Problem List   Diagnosis Date Noted   Acute respiratory failure with hypoxia ([place]) 11/25/2020   Diabetic foot ulcer ([place]) 11/24/2020   Hypervolemia associated with renal insufficiency 08/07/2020   Uremia 01/05/9484   Metabolic acidosis 46/27/0350   Hyperkalemia 08/07/2020   Nausea and vomiting 05/07/2020   Anemia of chronic kidney failure 05/07/2020   Dehydration    Hypertensive urgency 02/25/2020   Acute renal failure superimposed on stage 4 chronic kidney disease ([place]) 12/02/2019   Anemia 12/02/2019   Essential  hypertension 12/02/2019   SOB (shortness of breath) 12/02/2019   DM2 (diabetes mellitus, type 2) ([place]) 12/02/2019   Diarrhea 03/23/2017   Early satiety 03/23/2017   Generalized abdominal pain 03/23/2017    Past Surgical History:  Procedure Laterality Date   AV FISTULA PLACEMENT Left 08/11/2020   Procedure: LEFT UPPER EXTREMITY ARTERIOVENOUS (AV) FISTULA CREATION;  Surgeon: Cherre Robins, MD;  Location: [place];  Service: Vascular;  Laterality: Left;   FRACTURE SURGERY     I & D EXTREMITY Left 07/16/2014   Procedure: IRRIGATION AND DEBRIDEMENT EXTREMITY/LEFT INDEX FINGER;  Surgeon: Leanora Cover, MD;  Location: [place];  Service: Orthopedics;  Laterality: Left;   IR PERC TUN PERIT CATH WO PORT S&I /IMAG  08/07/2020   IR US GUIDE VASC ACCESS RIGHT  08/07/2020       Family History  Problem Relation Age of Onset   Cancer Mother    Stroke Father    Diabetes Father    Hypertension Father     Social History   Tobacco Use   Smoking status: Never   Smokeless tobacco: Never  Vaping Use   Vaping Use: Never used  Substance Use Topics   Alcohol use: Not Currently   Drug use: No    Home Medications Prior to Admission medications   Medication Sig Start Date End Date Taking? Authorizing Provider  atorvastatin (LIPITOR)  40 MG tablet Take 1 tablet (40 mg total) by mouth daily. Patient not taking: No sig reported 09/06/20   Gildardo Pounds, NP  atorvastatin (LIPITOR) 40 MG tablet TAKE 1 TABLET (40 MG TOTAL) BY MOUTH DAILY. 09/06/20 09/06/21  Gildardo Pounds, NP  Blood Glucose Monitoring Suppl (TRUE METRIX METER) w/Device KIT Use as instructed. Check blood glucose level by fingerstick once per day. 09/06/20   Gildardo Pounds, NP  carvedilol (COREG) 12.5 MG tablet Take 1 tablet (12.5 mg total) by mouth 2 (two) times daily with a meal. 11/27/20   Lavina Hamman, MD  hydrALAZINE (APRESOLINE) 25 MG tablet Take 1 tablet (25 mg total) by mouth 3 (three) times daily. 11/27/20   Lavina Hamman, MD   hydrOXYzine (ATARAX/VISTARIL) 25 MG tablet Take 1 tablet (25 mg total) by mouth 3 (three) times daily as needed for itching. 11/27/20   Lavina Hamman, MD  imipramine (TOFRANIL) 25 MG tablet TAKE 1 TABLET (25 MG TOTAL) BY MOUTH AT BEDTIME. FOR DEPRESSION 09/06/20 09/06/21  Gildardo Pounds, NP  insulin detemir (LEVEMIR) 100 UNIT/ML injection Inject 0.15 mLs (15 Units total) into the skin daily. Patient taking differently: Inject 10 Units into the skin daily. 05/10/20   Amin, Jeanella Flattery, MD  losartan (COZAAR) 50 MG tablet TAKE 1 TABLET (50 MG TOTAL) BY MOUTH DAILY. 08/13/20 08/13/21  Antonieta Pert, MD  multivitamin (RENA-VIT) TABS tablet Take 1 tablet by mouth at bedtime. 11/27/20   Lavina Hamman, MD  Nutritional Supplements (FEEDING SUPPLEMENT, [name] CARB STEADY,) LIQD Take 237 mLs by mouth 3 (three) times daily between meals. 11/27/20   Lavina Hamman, MD  sevelamer carbonate (RENVELA) 800 MG tablet TAKE 1 TABLET (800 MG TOTAL) BY MOUTH THREE TIMES DAILY WITH MEALS. Patient not taking: No sig reported 08/13/20 08/13/21  Antonieta Pert, MD  torsemide (DEMADEX) 20 MG tablet TAKE 2 TABLETS (40 MG) DAILY ON NON HEMODIALYSIS DAYS 08/13/20 08/13/21  Antonieta Pert, MD  TRUEplus Lancets 28G MISC Use as instructed. Check blood glucose level by fingerstick once per day. 09/06/20   Gildardo Pounds, NP  TRUEplus Lancets 28G MISC USE AS INSTRUCTED. CHECK BLOOD GLUCOSE LEVEL BY FINGERSTICK ONCE PER DAY. 09/06/20 09/06/21  Gildardo Pounds, NP    Allergies    Amlodipine and Hydralazine  Review of Systems   Review of Systems  All other systems reviewed and are negative.  Physical Exam Updated Vital Signs BP (!) 183/120 (BP Location: Right Arm)   Pulse 84   Temp 98.6 F (37 C)   Resp 20   SpO2 100%   Physical Exam Vitals and nursing note reviewed.  Constitutional:      General: He is not in acute distress.    Appearance: He is not ill-appearing.     Comments: Chronically ill-appearing  Cardiovascular:     Rate and  Rhythm: Normal rate.     Pulses: Normal pulses.  Pulmonary:     Effort: Pulmonary effort is normal.  Musculoskeletal:        General: No swelling.     Right lower leg: No edema.     Left lower leg: No edema.       Feet:     Comments: 2+ DP pulse of the right foot.  Normal capillary refill less than 2  Neurological:     Mental Status: He is alert and oriented to person, place, and time. Mental status is at baseline.  Psychiatric:  Mood and Affect: Mood normal.        Behavior: Behavior normal.    ED Results / Procedures / Treatments   Labs (all labs ordered are listed, but only abnormal results are displayed) Labs Reviewed  CBC WITH DIFFERENTIAL/PLATELET - Abnormal; Notable for the following components:      Result Value   RBC 3.65 (*)    Hemoglobin 9.6 (*)    HCT 31.1 (*)    RDW 18.2 (*)    All other components within normal limits  BASIC METABOLIC PANEL - Abnormal; Notable for the following components:   Chloride 93 (*)    Glucose, Bld 217 (*)    BUN 37 (*)    Creatinine, Ser 8.38 (*)    Calcium 7.9 (*)    GFR, Estimated 8 (*)    All other components within normal limits    EKG None  Radiology No results found.  Procedures Procedures   Medications Ordered in ED Medications - No data to display  ED Course  I have reviewed the triage vital signs and the nursing notes.  Pertinent labs & imaging results that were available during my care of the patient were reviewed by me and considered in my medical decision making (see chart for details).    MDM Rules/Calculators/A&P                          Patient is a 35 year old male with multiple medical problems who is presenting today due to a wound on his right foot.  It is now been there for several weeks and mom reports that it is starting to bleed more and she was afraid his toenail may come off.  Patient states it started after an injury to the foot.  He has no pain to the foot and denies any infectious  symptoms.  Patient has palpable pulses and low suspicion for vascular injury.  He does have ulcerative wound to the top of the right toe and the side of the third toe but no evidence of infection such as erythema, swelling, foul smell.  Wound care was provided.  Patient has follow-up with podiatry on Thursday and will need evaluation and may need wound care in the future.  He has no significant edema in his feet today.  His left foot appears normal.  Patient has been waiting for 12 hours and has not taken his blood pressure medication which would explain his hypotension clear.  He is going to take his blood pressure medication when he gets home.  Also patient has complained of significant decrease in his vision over the last 2 months and is planning on following up with an ophthalmologist but has no specific complaints about his vision today.  Mom is requesting home health and an aide because he she and his dad work and she is not sure that he is safe at home during the day.  He has been living with them for the last 2 months.  Social work will be consulted and home health was ordered.  Patient's mom's name is Moshe Salisbury when she can be contacted at [phone]  Labs today showed stable hb of 9.6 with normal Constancio count and BMP unchanged with normal K.  MDM   Amount and/or Complexity of Data Reviewed Tests in the radiology section of CPT: ordered and reviewed     Final Clinical Impression(s) / ED Diagnoses Final diagnoses:  Open toe wound, initial encounter  Rx / DC Orders ED Discharge Orders     None        Blanchie Dessert, MD 12/23/20 [phone]

## 2020-12-23 NOTE — ED Triage Notes (Signed)
Pt reports he is visually impaired, his uncle told him today he had bilateral feet cracking and swelling.

## 2020-12-23 NOTE — ED Provider Notes (Signed)
Emergency Medicine Provider Triage Evaluation Note  OLAOLUWA FERNS , a 35 y.o. male  was evaluated in triage.  Pt complains of foot swelling both sides.  Review of Systems  Positive: Sores on feet Negative: fever  Physical Exam  BP (!) 173/118 (BP Location: Right Arm)   Pulse 83   Temp 98.9 F (37.2 C) (Oral)   Resp 20   SpO2 100%  Gen:   Awake, no distress   Resp:  Normal effort  MSK:   Moves extremities without difficulty  Other:  Open sores bilat feet   Medical Decision Making  Medically screening exam initiated at 12:30 PM.  Appropriate orders placed.  MADOC POSA was informed that the remainder of the evaluation will be completed by another provider, this initial triage assessment does not replace that evaluation, and the importance of remaining in the ED until their evaluation is complete.     [name] 12/23/20 1232    Carmin Muskrat, MD 12/25/20 [phone]

## 2020-12-23 NOTE — ED Notes (Signed)
called pt for VS no answer

## 2020-12-23 NOTE — Discharge Instructions (Addendum)
You can leave the bandage on until Thursday when you see the podiatrist.  Make sure you are wearing a appropriately fitting shoe so it does not rub in the wrong spots.  You also should call the eye doctor to see if you can be seen sooner.  Home health aide and RN were ordered today as well as the social worker being made aware so someone should contact you tomorrow.

## 2021-01-08 ENCOUNTER — Ambulatory Visit: Payer: Medicaid Other | Attending: Physician Assistant | Admitting: Physician Assistant

## 2021-01-08 ENCOUNTER — Other Ambulatory Visit: Payer: Self-pay

## 2021-01-08 NOTE — Progress Notes (Deleted)
Patient ID: [name], male   DOB: April 16, 1986, 35 y.o.   MRN: [id number]  After  Patient is a 35 year old male with multiple medical problems who is presenting today due to a wound on his right foot.  It is now been there for several weeks and mom reports that it is starting to bleed more and she was afraid his toenail may come off.  Patient states it started after an injury to the foot.  He has no pain to the foot and denies any infectious symptoms.  Patient has palpable pulses and low suspicion for vascular injury.  He does have ulcerative wound to the top of the right toe and the side of the third toe but no evidence of infection such as erythema, swelling, foul smell.  Wound care was provided.  Patient has follow-up with podiatry on Thursday and will need evaluation and may need wound care in the future.  He has no significant edema in his feet today.  His left foot appears normal.  Patient has been waiting for 12 hours and has not taken his blood pressure medication which would explain his hypotension clear.  He is going to take his blood pressure medication when he gets home.  Also patient has complained of significant decrease in his vision over the last 2 months and is planning on following up with an ophthalmologist but has no specific complaints about his vision today.  Mom is requesting home health and an aide because he she and his dad work and she is not sure that he is safe at home during the day.  He has been living with them for the last 2 months.  Social work will be consulted and home health was ordered.

## 2021-02-04 ENCOUNTER — Other Ambulatory Visit: Payer: Self-pay

## 2021-02-04 MED ORDER — CARVEDILOL 12.5 MG PO TABS
12.5000 mg | ORAL_TABLET | Freq: Two times a day (BID) | ORAL | 3 refills | Status: DC
  Filled 2021-02-04: qty 60, 30d supply, fill #0

## 2021-02-04 MED ORDER — LOSARTAN POTASSIUM 100 MG PO TABS
100.0000 mg | ORAL_TABLET | Freq: Every day | ORAL | 3 refills | Status: DC
  Filled 2021-02-04: qty 30, 30d supply, fill #0

## 2021-02-05 ENCOUNTER — Other Ambulatory Visit: Payer: Self-pay

## 2021-02-05 MED ORDER — DOXYCYCLINE HYCLATE 100 MG PO TABS
ORAL_TABLET | ORAL | 0 refills | Status: DC
  Filled 2021-02-05: qty 14, 7d supply, fill #0

## 2021-02-06 ENCOUNTER — Other Ambulatory Visit: Payer: Self-pay

## 2021-02-08 ENCOUNTER — Inpatient Hospital Stay (HOSPITAL_COMMUNITY)
Admission: EM | Admit: 2021-02-08 | Discharge: 2021-03-05 | DRG: 637 | Disposition: A | Discharge status: Skilled Nursing Facility | Payer: Medicaid Other | Attending: Internal Medicine | Admitting: Internal Medicine

## 2021-02-08 ENCOUNTER — Emergency Department (HOSPITAL_COMMUNITY): Payer: Medicaid Other

## 2021-02-08 DIAGNOSIS — M898X9 Other specified disorders of bone, unspecified site: Secondary | ICD-10-CM | POA: Diagnosis present

## 2021-02-08 DIAGNOSIS — J9601 Acute respiratory failure with hypoxia: Secondary | ICD-10-CM

## 2021-02-08 DIAGNOSIS — N185 Chronic kidney disease, stage 5: Secondary | ICD-10-CM | POA: Diagnosis not present

## 2021-02-08 DIAGNOSIS — L97511 Non-pressure chronic ulcer of other part of right foot limited to breakdown of skin: Secondary | ICD-10-CM | POA: Diagnosis not present

## 2021-02-08 DIAGNOSIS — Z992 Dependence on renal dialysis: Secondary | ICD-10-CM | POA: Diagnosis not present

## 2021-02-08 DIAGNOSIS — L97519 Non-pressure chronic ulcer of other part of right foot with unspecified severity: Secondary | ICD-10-CM | POA: Diagnosis present

## 2021-02-08 DIAGNOSIS — R569 Unspecified convulsions: Secondary | ICD-10-CM

## 2021-02-08 DIAGNOSIS — I422 Other hypertrophic cardiomyopathy: Secondary | ICD-10-CM

## 2021-02-08 DIAGNOSIS — N186 End stage renal disease: Secondary | ICD-10-CM | POA: Diagnosis present

## 2021-02-08 DIAGNOSIS — E8809 Other disorders of plasma-protein metabolism, not elsewhere classified: Secondary | ICD-10-CM | POA: Diagnosis present

## 2021-02-08 DIAGNOSIS — D509 Iron deficiency anemia, unspecified: Secondary | ICD-10-CM | POA: Diagnosis present

## 2021-02-08 DIAGNOSIS — E1121 Type 2 diabetes mellitus with diabetic nephropathy: Secondary | ICD-10-CM

## 2021-02-08 DIAGNOSIS — E1110 Type 2 diabetes mellitus with ketoacidosis without coma: Secondary | ICD-10-CM | POA: Diagnosis present

## 2021-02-08 DIAGNOSIS — I5043 Acute on chronic combined systolic (congestive) and diastolic (congestive) heart failure: Secondary | ICD-10-CM | POA: Diagnosis present

## 2021-02-08 DIAGNOSIS — N2581 Secondary hyperparathyroidism of renal origin: Secondary | ICD-10-CM | POA: Diagnosis present

## 2021-02-08 DIAGNOSIS — R739 Hyperglycemia, unspecified: Secondary | ICD-10-CM

## 2021-02-08 DIAGNOSIS — E11621 Type 2 diabetes mellitus with foot ulcer: Secondary | ICD-10-CM | POA: Diagnosis present

## 2021-02-08 DIAGNOSIS — E875 Hyperkalemia: Secondary | ICD-10-CM | POA: Diagnosis present

## 2021-02-08 DIAGNOSIS — R55 Syncope and collapse: Secondary | ICD-10-CM | POA: Diagnosis not present

## 2021-02-08 DIAGNOSIS — I132 Hypertensive heart and chronic kidney disease with heart failure and with stage 5 chronic kidney disease, or end stage renal disease: Secondary | ICD-10-CM | POA: Diagnosis present

## 2021-02-08 DIAGNOSIS — D631 Anemia in chronic kidney disease: Secondary | ICD-10-CM | POA: Diagnosis present

## 2021-02-08 DIAGNOSIS — Z79899 Other long term (current) drug therapy: Secondary | ICD-10-CM

## 2021-02-08 DIAGNOSIS — H548 Legal blindness, as defined in USA: Secondary | ICD-10-CM | POA: Diagnosis present

## 2021-02-08 DIAGNOSIS — Q631 Lobulated, fused and horseshoe kidney: Secondary | ICD-10-CM | POA: Diagnosis not present

## 2021-02-08 DIAGNOSIS — E1101 Type 2 diabetes mellitus with hyperosmolarity with coma: Secondary | ICD-10-CM | POA: Diagnosis present

## 2021-02-08 DIAGNOSIS — E871 Hypo-osmolality and hyponatremia: Secondary | ICD-10-CM | POA: Diagnosis present

## 2021-02-08 DIAGNOSIS — I10 Essential (primary) hypertension: Secondary | ICD-10-CM | POA: Diagnosis present

## 2021-02-08 DIAGNOSIS — G9341 Metabolic encephalopathy: Secondary | ICD-10-CM | POA: Diagnosis present

## 2021-02-08 DIAGNOSIS — U071 COVID-19: Secondary | ICD-10-CM | POA: Diagnosis present

## 2021-02-08 DIAGNOSIS — G40909 Epilepsy, unspecified, not intractable, without status epilepticus: Secondary | ICD-10-CM | POA: Diagnosis present

## 2021-02-08 DIAGNOSIS — E1122 Type 2 diabetes mellitus with diabetic chronic kidney disease: Secondary | ICD-10-CM

## 2021-02-08 DIAGNOSIS — I161 Hypertensive emergency: Secondary | ICD-10-CM | POA: Diagnosis present

## 2021-02-08 DIAGNOSIS — H6691 Otitis media, unspecified, right ear: Secondary | ICD-10-CM | POA: Diagnosis present

## 2021-02-08 DIAGNOSIS — Z833 Family history of diabetes mellitus: Secondary | ICD-10-CM

## 2021-02-08 DIAGNOSIS — E119 Type 2 diabetes mellitus without complications: Secondary | ICD-10-CM

## 2021-02-08 DIAGNOSIS — E7800 Pure hypercholesterolemia, unspecified: Secondary | ICD-10-CM | POA: Diagnosis present

## 2021-02-08 DIAGNOSIS — E872 Acidosis: Secondary | ICD-10-CM | POA: Diagnosis present

## 2021-02-08 DIAGNOSIS — E8770 Fluid overload, unspecified: Secondary | ICD-10-CM

## 2021-02-08 DIAGNOSIS — E109 Type 1 diabetes mellitus without complications: Secondary | ICD-10-CM

## 2021-02-08 DIAGNOSIS — Z888 Allergy status to other drugs, medicaments and biological substances status: Secondary | ICD-10-CM

## 2021-02-08 DIAGNOSIS — Z794 Long term (current) use of insulin: Secondary | ICD-10-CM | POA: Diagnosis not present

## 2021-02-08 HISTORY — DX: Unspecified convulsions: R56.9

## 2021-02-08 LAB — BASIC METABOLIC PANEL
Anion gap: 17 — ABNORMAL HIGH (ref 5–15)
Anion gap: 16 — ABNORMAL HIGH (ref 5–15)
Anion gap: 19 — ABNORMAL HIGH (ref 5–15)
BUN: 71 mg/dL — ABNORMAL HIGH (ref 6–20)
BUN: 71 mg/dL — ABNORMAL HIGH (ref 6–20)
BUN: 72 mg/dL — ABNORMAL HIGH (ref 6–20)
CO2: 20 mmol/L — ABNORMAL LOW (ref 22–32)
CO2: 22 mmol/L (ref 22–32)
CO2: 22 mmol/L (ref 22–32)
Calcium: 8.2 mg/dL — ABNORMAL LOW (ref 8.9–10.3)
Calcium: 8.5 mg/dL — ABNORMAL LOW (ref 8.9–10.3)
Calcium: 8.7 mg/dL — ABNORMAL LOW (ref 8.9–10.3)
Chloride: 87 mmol/L — ABNORMAL LOW (ref 98–111)
Chloride: 90 mmol/L — ABNORMAL LOW (ref 98–111)
Chloride: 92 mmol/L — ABNORMAL LOW (ref 98–111)
Creatinine, Ser: 13.19 mg/dL — ABNORMAL HIGH (ref 0.61–1.24)
Creatinine, Ser: 13.13 mg/dL — ABNORMAL HIGH (ref 0.61–1.24)
Creatinine, Ser: 13.06 mg/dL — ABNORMAL HIGH (ref 0.61–1.24)
GFR, Estimated: 5 mL/min — ABNORMAL LOW (ref >60)
GFR, Estimated: 5 mL/min — ABNORMAL LOW (ref >60)
GFR, Estimated: 5 mL/min — ABNORMAL LOW (ref >60)
Glucose, Bld: 783 mg/dL (ref 70–99)
Glucose, Bld: 500 mg/dL — ABNORMAL HIGH (ref 70–99)
Glucose, Bld: 99 mg/dL (ref 70–99)
Potassium: 6.2 mmol/L — ABNORMAL HIGH (ref 3.5–5.1)
Potassium: 5 mmol/L (ref 3.5–5.1)
Potassium: 4.4 mmol/L (ref 3.5–5.1)
Sodium: 124 mmol/L — ABNORMAL LOW (ref 135–145)
Sodium: 128 mmol/L — ABNORMAL LOW (ref 135–145)
Sodium: 133 mmol/L — ABNORMAL LOW (ref 135–145)

## 2021-02-08 LAB — D-DIMER, QUANTITATIVE: D-Dimer, Quant: 1.38 ug/mL-FEU — ABNORMAL HIGH (ref 0.00–0.50)

## 2021-02-08 LAB — CBC WITH DIFFERENTIAL/PLATELET
Abs Immature Granulocytes: 0.03 10*3/uL (ref 0.00–0.07)
Basophils Absolute: 0 10*3/uL (ref 0.0–0.1)
Basophils Relative: 0 %
Eosinophils Absolute: 0.1 10*3/uL (ref 0.0–0.5)
Eosinophils Relative: 1 %
HCT: 34.8 % — ABNORMAL LOW (ref 39.0–52.0)
Hemoglobin: 10.6 g/dL — ABNORMAL LOW (ref 13.0–17.0)
Immature Granulocytes: 0 %
Lymphocytes Relative: 5 %
Lymphs Abs: 0.4 10*3/uL — ABNORMAL LOW (ref 0.7–4.0)
MCH: 25.7 pg — ABNORMAL LOW (ref 26.0–34.0)
MCHC: 30.5 g/dL (ref 30.0–36.0)
MCV: 84.3 fL (ref 80.0–100.0)
Monocytes Absolute: 0.4 10*3/uL (ref 0.1–1.0)
Monocytes Relative: 5 %
Neutro Abs: 6 10*3/uL (ref 1.7–7.7)
Neutrophils Relative %: 89 %
Platelets: 377 10*3/uL (ref 150–400)
RBC: 4.13 MIL/uL — ABNORMAL LOW (ref 4.22–5.81)
RDW: 18.6 % — ABNORMAL HIGH (ref 11.5–15.5)
WBC: 6.9 10*3/uL (ref 4.0–10.5)
nRBC: 0 % (ref 0.0–0.2)

## 2021-02-08 LAB — I-STAT VENOUS BLOOD GAS, ED
Acid-base deficit: 1 mmol/L (ref 0.0–2.0)
Bicarbonate: 22.8 mmol/L (ref 20.0–28.0)
Calcium, Ion: 0.92 mmol/L — ABNORMAL LOW (ref 1.15–1.40)
HCT: 34 % — ABNORMAL LOW (ref 39.0–52.0)
Hemoglobin: 11.6 g/dL — ABNORMAL LOW (ref 13.0–17.0)
O2 Saturation: 91 %
Potassium: 6 mmol/L — ABNORMAL HIGH (ref 3.5–5.1)
Sodium: 124 mmol/L — ABNORMAL LOW (ref 135–145)
TCO2: 24 mmol/L (ref 22–32)
pCO2, Ven: 34.9 mmHg — ABNORMAL LOW (ref 44.0–60.0)
pH, Ven: 7.423 (ref 7.250–7.430)
pO2, Ven: 58 mmHg — ABNORMAL HIGH (ref 32.0–45.0)

## 2021-02-08 LAB — CBG MONITORING, ED
Glucose-Capillary: >600 mg/dL (ref 70–99)
Glucose-Capillary: >600 mg/dL (ref 70–99)
Glucose-Capillary: >600 mg/dL (ref 70–99)
Glucose-Capillary: 553 mg/dL (ref 70–99)
Glucose-Capillary: 453 mg/dL — ABNORMAL HIGH (ref 70–99)
Glucose-Capillary: 356 mg/dL — ABNORMAL HIGH (ref 70–99)
Glucose-Capillary: 229 mg/dL — ABNORMAL HIGH (ref 70–99)
Glucose-Capillary: 304 mg/dL — ABNORMAL HIGH (ref 70–99)

## 2021-02-08 LAB — TROPONIN I (HIGH SENSITIVITY): Troponin I (High Sensitivity): 14 ng/L (ref <18)

## 2021-02-08 LAB — COMPREHENSIVE METABOLIC PANEL
ALT: 41 U/L (ref 0–44)
AST: 26 U/L (ref 15–41)
Albumin: 3.3 g/dL — ABNORMAL LOW (ref 3.5–5.0)
Alkaline Phosphatase: 183 U/L — ABNORMAL HIGH (ref 38–126)
Anion gap: 18 — ABNORMAL HIGH (ref 5–15)
BUN: 69 mg/dL — ABNORMAL HIGH (ref 6–20)
CO2: 20 mmol/L — ABNORMAL LOW (ref 22–32)
Calcium: 8.2 mg/dL — ABNORMAL LOW (ref 8.9–10.3)
Chloride: 86 mmol/L — ABNORMAL LOW (ref 98–111)
Creatinine, Ser: 13.19 mg/dL — ABNORMAL HIGH (ref 0.61–1.24)
GFR, Estimated: 5 mL/min — ABNORMAL LOW (ref >60)
Glucose, Bld: 789 mg/dL (ref 70–99)
Potassium: 5.7 mmol/L — ABNORMAL HIGH (ref 3.5–5.1)
Sodium: 124 mmol/L — ABNORMAL LOW (ref 135–145)
Total Bilirubin: 1.2 mg/dL (ref 0.3–1.2)
Total Protein: 7.5 g/dL (ref 6.5–8.1)

## 2021-02-08 LAB — I-STAT CHEM 8, ED
BUN: 63 mg/dL — ABNORMAL HIGH (ref 6–20)
Calcium, Ion: 0.99 mmol/L — ABNORMAL LOW (ref 1.15–1.40)
Chloride: 89 mmol/L — ABNORMAL LOW (ref 98–111)
Creatinine, Ser: 13.6 mg/dL — ABNORMAL HIGH (ref 0.61–1.24)
Glucose, Bld: >700 mg/dL (ref 70–99)
HCT: 37 % — ABNORMAL LOW (ref 39.0–52.0)
Hemoglobin: 12.6 g/dL — ABNORMAL LOW (ref 13.0–17.0)
Potassium: 5.6 mmol/L — ABNORMAL HIGH (ref 3.5–5.1)
Sodium: 124 mmol/L — ABNORMAL LOW (ref 135–145)
TCO2: 21 mmol/L — ABNORMAL LOW (ref 22–32)

## 2021-02-08 LAB — FERRITIN: Ferritin: 773 ng/mL — ABNORMAL HIGH (ref 24–336)

## 2021-02-08 LAB — RESP PANEL BY RT-PCR (FLU A&B, COVID) ARPGX2
Influenza A by PCR: NEGATIVE
Influenza B by PCR: NEGATIVE
SARS Coronavirus 2 by RT PCR: POSITIVE — AB

## 2021-02-08 LAB — BETA-HYDROXYBUTYRIC ACID: Beta-Hydroxybutyric Acid: 0.53 mmol/L — ABNORMAL HIGH (ref 0.05–0.27)

## 2021-02-08 LAB — HEPATITIS B SURFACE ANTIGEN: Hepatitis B Surface Ag: NONREACTIVE

## 2021-02-08 LAB — LACTIC ACID, PLASMA
Lactic Acid, Venous: 3.9 mmol/L (ref 0.5–1.9)
Lactic Acid, Venous: 2.6 mmol/L (ref 0.5–1.9)

## 2021-02-08 LAB — TYPE AND SCREEN
ABO/RH(D): O POS
Antibody Screen: NEGATIVE

## 2021-02-08 LAB — OSMOLALITY: Osmolality: 323 mOsm/kg (ref 275–295)

## 2021-02-08 LAB — BRAIN NATRIURETIC PEPTIDE: B Natriuretic Peptide: >4500 pg/mL — ABNORMAL HIGH (ref 0.0–100.0)

## 2021-02-08 LAB — ETHANOL: Alcohol, Ethyl (B): <10 mg/dL (ref <10)

## 2021-02-08 LAB — FIBRINOGEN: Fibrinogen: 386 mg/dL (ref 210–475)

## 2021-02-08 LAB — LACTATE DEHYDROGENASE: LDH: 245 U/L — ABNORMAL HIGH (ref 98–192)

## 2021-02-08 LAB — C-REACTIVE PROTEIN: CRP: 0.8 mg/dL (ref <1.0)

## 2021-02-08 LAB — PROCALCITONIN: Procalcitonin: 1.1 ng/mL

## 2021-02-08 MED ORDER — SODIUM CHLORIDE 0.9% FLUSH
3.0000 mL | INTRAVENOUS | Status: DC | PRN
  Administered 2021-02-27: 3 mL via INTRAVENOUS

## 2021-02-08 MED ORDER — ALBUTEROL SULFATE HFA 108 (90 BASE) MCG/ACT IN AERS
2.0000 | INHALATION_SPRAY | Freq: Once | RESPIRATORY_TRACT | Status: DC | PRN
  Filled 2021-02-08: qty 6.7

## 2021-02-08 MED ORDER — DIPHENHYDRAMINE HCL 50 MG/ML IJ SOLN: 50.0000 mg | Freq: Once | INTRAMUSCULAR | Status: DC | PRN

## 2021-02-08 MED ORDER — SODIUM CHLORIDE 0.9 % IV SOLN: 250.0000 mL | INTRAVENOUS | Status: DC | PRN

## 2021-02-08 MED ORDER — ATORVASTATIN CALCIUM 40 MG PO TABS
40.0000 mg | ORAL_TABLET | Freq: Every day | ORAL | Status: DC
  Administered 2021-02-09 – 2021-03-05 (×25): 40 mg via ORAL
  Filled 2021-02-08 (×24): qty 1

## 2021-02-08 MED ORDER — INSULIN ASPART 100 UNIT/ML IJ SOLN
0.0000 [IU] | Freq: Three times a day (TID) | INTRAMUSCULAR | Status: DC
  Administered 2021-02-09: 2 [IU] via SUBCUTANEOUS
  Administered 2021-02-09: 3 [IU] via SUBCUTANEOUS
  Administered 2021-02-09 – 2021-02-10 (×3): 1 [IU] via SUBCUTANEOUS
  Administered 2021-02-11: 2 [IU] via SUBCUTANEOUS
  Administered 2021-02-11: 3 [IU] via SUBCUTANEOUS
  Administered 2021-02-11 – 2021-02-12 (×2): 1 [IU] via SUBCUTANEOUS
  Administered 2021-02-12: 4 [IU] via SUBCUTANEOUS
  Administered 2021-02-12: 3 [IU] via SUBCUTANEOUS
  Administered 2021-02-13 (×3): 1 [IU] via SUBCUTANEOUS
  Administered 2021-02-14: 6 [IU] via SUBCUTANEOUS
  Administered 2021-02-14: 1 [IU] via SUBCUTANEOUS
  Administered 2021-02-14: 5 [IU] via SUBCUTANEOUS
  Administered 2021-02-15: 3 [IU] via SUBCUTANEOUS
  Administered 2021-02-15: 6 [IU] via SUBCUTANEOUS
  Administered 2021-02-15: 2 [IU] via SUBCUTANEOUS
  Administered 2021-02-16: 5 [IU] via SUBCUTANEOUS
  Administered 2021-02-16: 1 [IU] via SUBCUTANEOUS
  Administered 2021-02-16: 4 [IU] via SUBCUTANEOUS
  Administered 2021-02-18: 3 [IU] via SUBCUTANEOUS
  Administered 2021-02-19: 2 [IU] via SUBCUTANEOUS
  Administered 2021-02-20 – 2021-02-22 (×5): 1 [IU] via SUBCUTANEOUS
  Administered 2021-02-22 – 2021-02-24 (×2): 2 [IU] via SUBCUTANEOUS
  Administered 2021-02-24: 1 [IU] via SUBCUTANEOUS
  Administered 2021-02-26: 2 [IU] via SUBCUTANEOUS
  Administered 2021-02-27: 1 [IU] via SUBCUTANEOUS
  Administered 2021-02-27: 2 [IU] via SUBCUTANEOUS
  Administered 2021-03-01 – 2021-03-03 (×3): 1 [IU] via SUBCUTANEOUS

## 2021-02-08 MED ORDER — INSULIN ASPART 100 UNIT/ML IJ SOLN
0.0000 [IU] | Freq: Every day | INTRAMUSCULAR | Status: DC
  Administered 2021-02-09: 3 [IU] via SUBCUTANEOUS
  Administered 2021-02-10: 2 [IU] via SUBCUTANEOUS
  Administered 2021-02-11: 4 [IU] via SUBCUTANEOUS
  Administered 2021-02-12: 5 [IU] via SUBCUTANEOUS
  Administered 2021-02-13 – 2021-02-15 (×2): 4 [IU] via SUBCUTANEOUS
  Administered 2021-02-16 – 2021-02-21 (×5): 2 [IU] via SUBCUTANEOUS
  Administered 2021-02-22: 3 [IU] via SUBCUTANEOUS
  Administered 2021-02-23 – 2021-02-24 (×2): 2 [IU] via SUBCUTANEOUS
  Administered 2021-02-25: 3 [IU] via SUBCUTANEOUS
  Administered 2021-02-27 – 2021-03-04 (×2): 2 [IU] via SUBCUTANEOUS

## 2021-02-08 MED ORDER — FUROSEMIDE 10 MG/ML IJ SOLN
40.0000 mg | Freq: Two times a day (BID) | INTRAMUSCULAR | Status: DC
  Administered 2021-02-08 – 2021-02-09 (×2): 40 mg via INTRAVENOUS
  Filled 2021-02-08 (×2): qty 4

## 2021-02-08 MED ORDER — INSULIN GLARGINE-YFGN 100 UNIT/ML ~~LOC~~ SOLN
5.0000 [IU] | Freq: Every day | SUBCUTANEOUS | Status: DC
  Administered 2021-02-09 – 2021-02-10 (×3): 5 [IU] via SUBCUTANEOUS
  Filled 2021-02-08 (×5): qty 0.05

## 2021-02-08 MED ORDER — SODIUM CHLORIDE 0.9% FLUSH
3.0000 mL | Freq: Two times a day (BID) | INTRAVENOUS | Status: DC
  Administered 2021-02-08 – 2021-03-05 (×40): 3 mL via INTRAVENOUS

## 2021-02-08 MED ORDER — SODIUM CHLORIDE 0.9 % IV SOLN: INTRAVENOUS | Status: DC | PRN

## 2021-02-08 MED ORDER — SEVELAMER CARBONATE 800 MG PO TABS
1600.0000 mg | ORAL_TABLET | Freq: Three times a day (TID) | ORAL | Status: DC
  Administered 2021-02-09 – 2021-03-05 (×65): 1600 mg via ORAL
  Filled 2021-02-08 (×65): qty 2

## 2021-02-08 MED ORDER — ACETAMINOPHEN 325 MG PO TABS
650.0000 mg | ORAL_TABLET | Freq: Four times a day (QID) | ORAL | Status: DC | PRN
  Administered 2021-02-08: 650 mg via ORAL
  Filled 2021-02-08: qty 2

## 2021-02-08 MED ORDER — METHYLPREDNISOLONE SODIUM SUCC 125 MG IJ SOLR: 125.0000 mg | Freq: Once | INTRAMUSCULAR | Status: DC | PRN

## 2021-02-08 MED ORDER — BEBTELOVIMAB 175 MG/2 ML IV (EUA)
175.0000 mg | Freq: Once | INTRAMUSCULAR | Status: AC
  Administered 2021-02-08: 175 mg via INTRAVENOUS
  Filled 2021-02-08: qty 2

## 2021-02-08 MED ORDER — ACETAMINOPHEN 325 MG PO TABS
650.0000 mg | ORAL_TABLET | ORAL | Status: DC | PRN
  Filled 2021-02-08: qty 2

## 2021-02-08 MED ORDER — HEPARIN SODIUM (PORCINE) 5000 UNIT/ML IJ SOLN
5000.0000 [IU] | Freq: Two times a day (BID) | INTRAMUSCULAR | Status: DC
  Administered 2021-02-09 – 2021-03-05 (×43): 5000 [IU] via SUBCUTANEOUS
  Filled 2021-02-08 (×46): qty 1

## 2021-02-08 MED ORDER — HYDRALAZINE HCL 20 MG/ML IJ SOLN
5.0000 mg | Freq: Four times a day (QID) | INTRAMUSCULAR | Status: DC | PRN
  Administered 2021-02-08: 5 mg via INTRAVENOUS
  Filled 2021-02-08: qty 1

## 2021-02-08 MED ORDER — ONDANSETRON HCL 4 MG/2ML IJ SOLN
4.0000 mg | Freq: Four times a day (QID) | INTRAMUSCULAR | Status: DC | PRN
  Filled 2021-02-08: qty 2

## 2021-02-08 MED ORDER — SODIUM CHLORIDE 0.9 % IV SOLN
25.0000 mg | Freq: Four times a day (QID) | INTRAVENOUS | Status: DC | PRN
  Filled 2021-02-08 (×5): qty 1

## 2021-02-08 MED ORDER — HYDROXYZINE HCL 25 MG PO TABS
25.0000 mg | ORAL_TABLET | Freq: Three times a day (TID) | ORAL | Status: DC | PRN
  Administered 2021-02-08 – 2021-02-12 (×3): 25 mg via ORAL
  Filled 2021-02-08 (×3): qty 1

## 2021-02-08 MED ORDER — METOPROLOL TARTRATE 5 MG/5ML IV SOLN
2.5000 mg | INTRAVENOUS | Status: AC
  Administered 2021-02-08: 2.5 mg via INTRAVENOUS
  Filled 2021-02-08: qty 5

## 2021-02-08 MED ORDER — FENTANYL CITRATE (PF) 100 MCG/2ML IJ SOLN
50.0000 ug | Freq: Once | INTRAMUSCULAR | Status: AC
  Administered 2021-02-08: 50 ug via INTRAVENOUS
  Filled 2021-02-08: qty 2

## 2021-02-08 MED ORDER — CARVEDILOL 12.5 MG PO TABS
12.5000 mg | ORAL_TABLET | Freq: Two times a day (BID) | ORAL | Status: DC
  Administered 2021-02-08 – 2021-02-13 (×8): 12.5 mg via ORAL
  Filled 2021-02-08 (×8): qty 1

## 2021-02-08 MED ORDER — HYDRALAZINE HCL 25 MG PO TABS
25.0000 mg | ORAL_TABLET | Freq: Three times a day (TID) | ORAL | Status: DC
  Administered 2021-02-09 (×3): 25 mg via ORAL
  Filled 2021-02-08 (×3): qty 1

## 2021-02-08 MED ORDER — DOXYCYCLINE HYCLATE 100 MG PO TABS
100.0000 mg | ORAL_TABLET | Freq: Two times a day (BID) | ORAL | Status: AC
  Administered 2021-02-09 – 2021-02-21 (×20): 100 mg via ORAL
  Filled 2021-02-08 (×27): qty 1

## 2021-02-08 MED ORDER — IMIPRAMINE HCL 25 MG PO TABS
25.0000 mg | ORAL_TABLET | Freq: Every day | ORAL | Status: DC
  Administered 2021-02-09 – 2021-02-28 (×5): 25 mg via ORAL
  Filled 2021-02-08 (×28): qty 1

## 2021-02-08 MED ORDER — RENA-VITE PO TABS
1.0000 | ORAL_TABLET | Freq: Every day | ORAL | Status: DC
  Administered 2021-02-09 – 2021-03-04 (×25): 1 via ORAL
  Filled 2021-02-08 (×26): qty 1

## 2021-02-08 MED ORDER — INSULIN REGULAR(HUMAN) IN NACL 100-0.9 UT/100ML-% IV SOLN
INTRAVENOUS | Status: DC
  Administered 2021-02-08: 6.5 [IU]/h via INTRAVENOUS
  Filled 2021-02-08: qty 100

## 2021-02-08 MED ORDER — HYDRALAZINE HCL 20 MG/ML IJ SOLN
10.0000 mg | Freq: Four times a day (QID) | INTRAMUSCULAR | Status: DC | PRN
  Administered 2021-02-23 – 2021-03-01 (×4): 10 mg via INTRAVENOUS
  Filled 2021-02-08 (×4): qty 1

## 2021-02-08 MED ORDER — DEXTROSE 50 % IV SOLN: 0.0000 mL | INTRAVENOUS | Status: DC | PRN

## 2021-02-08 MED ORDER — HYDRALAZINE HCL 20 MG/ML IJ SOLN
10.0000 mg | INTRAMUSCULAR | Status: AC
  Administered 2021-02-08: 10 mg via INTRAVENOUS
  Filled 2021-02-08: qty 1

## 2021-02-08 MED ORDER — FAMOTIDINE IN NACL 20-0.9 MG/50ML-% IV SOLN
20.0000 mg | Freq: Once | INTRAVENOUS | Status: DC | PRN
  Filled 2021-02-08: qty 50

## 2021-02-08 MED ORDER — DEXTROSE IN LACTATED RINGERS 5 % IV SOLN
INTRAVENOUS | Status: DC
Start: 2021-02-08 — End: 2021-02-08

## 2021-02-08 MED ORDER — LACTATED RINGERS IV BOLUS: 20.0000 mL/kg | Freq: Once | INTRAVENOUS | Status: DC

## 2021-02-08 MED ORDER — DEXTROSE IN LACTATED RINGERS 5 % IV SOLN
INTRAVENOUS | Status: DC
Start: 2021-02-08 — End: 2021-02-09

## 2021-02-08 MED ORDER — EPINEPHRINE 0.3 MG/0.3ML IJ SOAJ
0.3000 mg | Freq: Once | INTRAMUSCULAR | Status: DC | PRN
  Filled 2021-02-08: qty 0.6

## 2021-02-08 MED ORDER — BUTALBITAL-APAP-CAFFEINE 50-325-40 MG PO TABS
1.0000 | ORAL_TABLET | Freq: Four times a day (QID) | ORAL | Status: DC | PRN
  Administered 2021-02-09: 1 via ORAL
  Filled 2021-02-08: qty 1

## 2021-02-08 MED ORDER — INSULIN REGULAR(HUMAN) IN NACL 100-0.9 UT/100ML-% IV SOLN
INTRAVENOUS | Status: DC
  Administered 2021-02-08: 5 [IU]/h via INTRAVENOUS

## 2021-02-08 MED ORDER — LACTATED RINGERS IV SOLN: INTRAVENOUS | Status: DC

## 2021-02-08 MED ORDER — DEXTROSE IN LACTATED RINGERS 5 % IV SOLN: INTRAVENOUS | Status: DC

## 2021-02-08 MED ORDER — NEPRO/CARBSTEADY PO LIQD
237.0000 mL | Freq: Three times a day (TID) | ORAL | Status: DC
  Administered 2021-02-09 – 2021-02-22 (×16): 237 mL via ORAL
  Filled 2021-02-08: qty 237

## 2021-02-08 NOTE — Consult Note (Signed)
WOC Nurse Consult Note: Reason for Consult:Right foot, 1st and 3rd digits, distal aspect. Patient is blind.He states toenail was removed by podiatric medicine, but no note from that Encounter is found in the EHR. Denies trauma. Wound type:neuropathic, trauma Pressure Injury POA: N/A Measurement:1st and 3rd digits at nail Wound bed:red, dried serum Drainage (amount, consistency, odor) small serosanguinous Periwound: discolored Dressing procedure/placement/frequency: I will provide conservative care orders for Nursing twice daily to prevent infection and reinjury. Twice daily soap and water cleanse with thorough, but gentle drying will be followed by painting the affected sites with a betadine swabstick and allowing it to air-dry. When dry, a dry gauze dressing will be applied and secured.  Dressing wil continue for 14 days.  Patient is to return to the care of the Podiatrist seen last week or if this provider cannot be located, referral made to podiatric medicine for follow up and input on the patient's foot care POC. Additionally, patient should always wear protective shoe wear and never walk barefoot-even in the home.  [facility] nursing team will not follow, but will remain available to this patient, the nursing and medical teams.  Please re-consult if needed. Thanks, Maudie Flakes, MSN, RN, [facility] [place], Arther Abbott  Pager# [phone]

## 2021-02-08 NOTE — ED Provider Notes (Signed)
[facility] EMERGENCY DEPARTMENT Provider Note   CSN: [id number] Arrival date & time: 02/08/21  1315     History Chief Complaint  Patient presents with   Seizures         [name] is a 36 y.o. male with a hx of ESRD on dialysis MWF, DM, hypertension, and hypercholesterolemia who presents to the ED via EMS S/p seizure activity shortly PTA. Patient states he does not know what happened, he is asking for family, and is confused, level 5 caveat secondary to AMS.   Per EMS received a call the patient was having seizure activity, upon fire department arrival patient was having agonal respirations with a weak pulse they bagged the patient with improvement.  Per family patient was sitting on the couch watching TV when he lost consciousness and had generalized shaking that they estimate lasted for about 10 seconds.  He currently seems confused to them.  He is on dialysis and missed treatment on Friday, last dialysis was Wednesday, has had a cough, but has otherwise been in his normal state of health.    HPI     Past Medical History:  Diagnosis Date   Diabetes mellitus    ESRD on hemodialysis ([place])    High cholesterol    Hypertension     Patient Active Problem List   Diagnosis Date Noted   Acute respiratory failure with hypoxia ([place]) 11/25/2020   Diabetic foot ulcer ([place]) 11/24/2020   Hypervolemia associated with renal insufficiency 08/07/2020   Uremia 54/27/0623   Metabolic acidosis 76/28/3151   Hyperkalemia 08/07/2020   Nausea and vomiting 05/07/2020   Anemia of chronic kidney failure 05/07/2020   Dehydration    Hypertensive urgency 02/25/2020   Acute renal failure superimposed on stage 4 chronic kidney disease ([place]) 12/02/2019   Anemia 12/02/2019   Essential hypertension 12/02/2019   SOB (shortness of breath) 12/02/2019   DM2 (diabetes mellitus, type 2) ([place]) 12/02/2019   Diarrhea 03/23/2017   Early satiety 03/23/2017   Generalized abdominal pain  03/23/2017    Past Surgical History:  Procedure Laterality Date   AV FISTULA PLACEMENT Left 08/11/2020   Procedure: LEFT UPPER EXTREMITY ARTERIOVENOUS (AV) FISTULA CREATION;  Surgeon: Cherre Robins, MD;  Location: [facility];  Service: Vascular;  Laterality: Left;   FRACTURE SURGERY     I & D EXTREMITY Left 07/16/2014   Procedure: IRRIGATION AND DEBRIDEMENT EXTREMITY/LEFT INDEX FINGER;  Surgeon: Leanora Cover, MD;  Location: [facility];  Service: Orthopedics;  Laterality: Left;   IR PERC TUN PERIT CATH WO PORT S&I /IMAG  08/07/2020   IR US GUIDE VASC ACCESS RIGHT  08/07/2020       Family History  Problem Relation Age of Onset   Cancer Mother    Stroke Father    Diabetes Father    Hypertension Father     Social History   Tobacco Use   Smoking status: Never   Smokeless tobacco: Never  Vaping Use   Vaping Use: Never used  Substance Use Topics   Alcohol use: Not Currently   Drug use: No    Home Medications Prior to Admission medications   Medication Sig Start Date End Date Taking? Authorizing Provider  atorvastatin (LIPITOR) 40 MG tablet Take 1 tablet (40 mg total) by mouth daily. Patient not taking: No sig reported 09/06/20   Gildardo Pounds, NP  atorvastatin (LIPITOR) 40 MG tablet TAKE 1 TABLET (40 MG TOTAL) BY MOUTH DAILY. 09/06/20 09/06/21  Geryl Rankins  W, NP  Blood Glucose Monitoring Suppl (TRUE METRIX METER) w/Device KIT Use as instructed. Check blood glucose level by fingerstick once per day. 09/06/20   Gildardo Pounds, NP  carvedilol (COREG) 12.5 MG tablet Take 1 tablet (12.5 mg total) by mouth 2 (two) times daily with a meal. 11/27/20   Lavina Hamman, MD  carvedilol (COREG) 12.5 MG tablet Take 1 tablet by mouth twice a day with meals 02/04/21     doxycycline (VIBRA-TABS) 100 MG tablet Take 1 tablet by mouth twice daily for 7 days 02/05/21     hydrALAZINE (APRESOLINE) 25 MG tablet Take 1 tablet (25 mg total) by mouth 3 (three) times daily. 11/27/20   Lavina Hamman, MD   hydrOXYzine (ATARAX/VISTARIL) 25 MG tablet Take 1 tablet (25 mg total) by mouth 3 (three) times daily as needed for itching. 11/27/20   Lavina Hamman, MD  imipramine (TOFRANIL) 25 MG tablet TAKE 1 TABLET (25 MG TOTAL) BY MOUTH AT BEDTIME. FOR DEPRESSION 09/06/20 09/06/21  Gildardo Pounds, NP  insulin detemir (LEVEMIR) 100 UNIT/ML injection Inject 0.15 mLs (15 Units total) into the skin daily. Patient taking differently: Inject 10 Units into the skin daily. 05/10/20   Amin, Jeanella Flattery, MD  losartan (COZAAR) 100 MG tablet Take 1 tablet by mouth once a day 02/04/21     losartan (COZAAR) 50 MG tablet TAKE 1 TABLET (50 MG TOTAL) BY MOUTH DAILY. 08/13/20 08/13/21  Antonieta Pert, MD  multivitamin (RENA-VIT) TABS tablet Take 1 tablet by mouth at bedtime. 11/27/20   Lavina Hamman, MD  Nutritional Supplements (FEEDING SUPPLEMENT, [name] CARB STEADY,) LIQD Take 237 mLs by mouth 3 (three) times daily between meals. 11/27/20   Lavina Hamman, MD  sevelamer carbonate (RENVELA) 800 MG tablet TAKE 1 TABLET (800 MG TOTAL) BY MOUTH THREE TIMES DAILY WITH MEALS. Patient not taking: No sig reported 08/13/20 08/13/21  Antonieta Pert, MD  torsemide (DEMADEX) 20 MG tablet TAKE 2 TABLETS (40 MG) DAILY ON NON HEMODIALYSIS DAYS 08/13/20 08/13/21  Antonieta Pert, MD  TRUEplus Lancets 28G MISC Use as instructed. Check blood glucose level by fingerstick once per day. 09/06/20   Gildardo Pounds, NP  TRUEplus Lancets 28G MISC USE AS INSTRUCTED. CHECK BLOOD GLUCOSE LEVEL BY FINGERSTICK ONCE PER DAY. 09/06/20 09/06/21  Gildardo Pounds, NP    Allergies    Amlodipine and Hydralazine  Review of Systems   Review of Systems  Unable to perform ROS: Mental status change   Physical Exam Updated Vital Signs BP (!) 182/116   Pulse 93   Temp 98 F (36.7 C) (Oral)   Resp 19   SpO2 93%   Physical Exam Vitals and nursing note reviewed.  Constitutional:      Appearance: He is not toxic-appearing.  HENT:     Head: Normocephalic.     Comments: No  raccoon eyes or battle sign. Cardiovascular:     Rate and Rhythm: Normal rate and regular rhythm.  Pulmonary:     Breath sounds: No wheezing.     Comments: Does not appear in respiratory distress, spontaneous respirations being maintained, currently 92% on RA.  Decreased breath sounds at the bases. Abdominal:     General: There is no distension.     Palpations: Abdomen is soft.     Tenderness: There is no abdominal tenderness. There is no guarding or rebound.  Musculoskeletal:     Comments: Mild edema to bilateral lower legs.   Skin:  General: Skin is warm and dry.  Neurological:     Mental Status: He is alert.     Comments: Alert. Patient seems confused, disoriented to situation, having trouble answering questions. He is partially blind at baseline, pupils are symmetric and reactive. EOM intact when prompted to look certain directions. Sensation grossly intact x 4. Able to move all extremities against resistance, some difficulty falling commands, no drift noted.     ED Results / Procedures / Treatments   Labs (all labs ordered are listed, but only abnormal results are displayed) Labs Reviewed  COMPREHENSIVE METABOLIC PANEL - Abnormal; Notable for the following components:      Result Value   Sodium 124 (*)    Potassium 5.7 (*)    Chloride 86 (*)    CO2 20 (*)    Glucose, Bld 789 (*)    BUN 69 (*)    Creatinine, Ser 13.19 (*)    Calcium 8.2 (*)    Albumin 3.3 (*)    Alkaline Phosphatase 183 (*)    GFR, Estimated 5 (*)    Anion gap 18 (*)    All other components within normal limits  CBC WITH DIFFERENTIAL/PLATELET - Abnormal; Notable for the following components:   RBC 4.13 (*)    Hemoglobin 10.6 (*)    HCT 34.8 (*)    MCH 25.7 (*)    RDW 18.6 (*)    Lymphs Abs 0.4 (*)    All other components within normal limits  I-STAT CHEM 8, ED - Abnormal; Notable for the following components:   Sodium 124 (*)    Potassium 5.6 (*)    Chloride 89 (*)    BUN 63 (*)     Creatinine, Ser 13.60 (*)    Glucose, Bld >700 (*)    Calcium, Ion 0.99 (*)    TCO2 21 (*)    Hemoglobin 12.6 (*)    HCT 37.0 (*)    All other components within normal limits  CBG MONITORING, ED - Abnormal; Notable for the following components:   Glucose-Capillary >600 (*)    All other components within normal limits  RESP PANEL BY RT-PCR (FLU A&B, COVID) ARPGX2  ETHANOL  URINALYSIS, ROUTINE W REFLEX MICROSCOPIC  LACTIC ACID, PLASMA  LACTIC ACID, PLASMA  RAPID URINE DRUG SCREEN, HOSP PERFORMED  BASIC METABOLIC PANEL  BASIC METABOLIC PANEL  BASIC METABOLIC PANEL  BASIC METABOLIC PANEL  BETA-HYDROXYBUTYRIC ACID  BETA-HYDROXYBUTYRIC ACID  CBG MONITORING, ED  I-STAT VENOUS BLOOD GAS, ED    EKG None  Radiology CT Head Wo Contrast  Result Date: 02/08/2021 CLINICAL DATA:  Nontraumatic seizure. EXAM: CT HEAD WITHOUT CONTRAST TECHNIQUE: Contiguous axial images were obtained from the base of the skull through the vertex without intravenous contrast. COMPARISON:  Nov 26, 2020 FINDINGS: Brain: No evidence of acute infarction, hemorrhage, hydrocephalus, extra-axial collection or mass lesion/mass effect. Vascular: No hyperdense vessel or unexpected calcification. Skull: Normal. Negative for fracture or focal lesion. Sinuses/Orbits: No acute finding. Other: None. IMPRESSION: No acute intracranial abnormalities are identified. Electronically Signed   By: Dorise Bullion III M.D   On: 02/08/2021 15:09   DG Chest Portable 1 View  Result Date: 02/08/2021 CLINICAL DATA:  Cough,CP. EXAM: PORTABLE CHEST 1 VIEW COMPARISON:  Chest radiograph 11/25/2020 FINDINGS: Interval removal of a right central venous catheter. Stable cardiomediastinal contours with enlarged heart size. Low lung volumes. There is central vascular congestion and slight diffuse bilateral interstitial thickening. No new focal consolidation. No pneumothorax or large pleural effusion.  No acute finding in the visualized skeleton.  IMPRESSION: Findings suggestive of mild interstitial edema. No new focal consolidation. Electronically Signed   By: Audie Pinto M.D.   On: 02/08/2021 14:08    Procedures .Critical Care  Date/Time: 02/08/2021 3:54 PM Performed by: Amaryllis Dyke, PA-C Authorized by: Amaryllis Dyke, PA-C    CRITICAL CARE Performed by: Kennith Maes   Total critical care time: 35 minutes  Critical care time was exclusive of separately billable procedures and treating other patients.  Critical care was necessary to treat or prevent imminent or life-threatening deterioration.  Critical care was time spent personally by me on the following activities: development of treatment plan with patient and/or surrogate as well as nursing, discussions with consultants, evaluation of patient's response to treatment, examination of patient, obtaining history from patient or surrogate, ordering and performing treatments and interventions, ordering and review of laboratory studies, ordering and review of radiographic studies, pulse oximetry and re-evaluation of patient's condition.  Medications Ordered in ED Medications  insulin regular, human (MYXREDLIN) 100 units/ 100 mL infusion (has no administration in time range)  dextrose 5 % in lactated ringers infusion (has no administration in time range)  dextrose 50 % solution 0-50 mL (has no administration in time range)    ED Course  I have reviewed the triage vital signs and the nursing notes.  Pertinent labs & imaging results that were available during my care of the patient were reviewed by me and considered in my medical decision making (see chart for details).    MDM Rules/Calculators/A&P                           Patient presents to the ED for evaluation following seizure activity shortly prior to arrival.  Patient alert but confused with some difficulty following commands on initial emergency department assessment.  He does not seem  to have any focal neurologic deficits.  SPO2 in the low 90s, respirations are spontaneous, will continue to monitor closely. BP elevated.   Additional history obtained:  Additional history obtained from chart review & nursing note review.   Lab Tests:  I Ordered, reviewed, and interpreted labs, which included:  CBC: Mild anemia similar to prior CMP: Hyperglycemia with glucose of 789, bicarb is 20, gap 18.  Potassium is 5.7.  Lactic acid elevated at 3.9. Ethanol level: Negative  Imaging Studies ordered:  I ordered imaging studies which included CT head & CXR, I independently reviewed, formal radiology impression shows:  CT head: No acute intracranial abnormalities are identified CXR: Findings suggestive of mild interstitial edema. No new focal consolidation  ED Course:  Will start insulin drip given hyperglycemia with some concern for DKA, beta hydroxybutyrate acid and VBG ordered and pending.  No additional fluids at this time given ESRD patient, we have ordered a reduced rate of dextrose containing fluids for CBG < 250.  Patient has desaturated in the emergency department to 83% on room air-placed on 2 L via nasal cannula with improvement.  He has had improvement in his mental status throughout emergency department stay, now able to tell me that he remembers sitting on the couch after taking a shower and is not sure what happened after this.  He does not have any complaints of pain at this time.  He denies drug or alcohol use.  Plan to discuss with nephrology and subsequently medicine for admission.   15:44: CONSULT: Discussed with nephrologist Dr. Jonnie Finner- aware of  patient.   15:59: Updated patient's aunt via telephone on results & plan of care, she is in agreement, would like to be updated when the patient moves to a room upstairs if possible.   Patient care signed out to Providence Lanius PA-C at change of shift pending consult for admission.   This is a shared visit with supervising  physician Dr. Billy Fischer who has independently evaluated patient & provided guidance in evaluation/management/disposition, in agreement with care   Portions of this note were generated with Dragon dictation software. Dictation errors may occur despite best attempts at proofreading.  Final Clinical Impression(s) / ED Diagnoses Final diagnoses:  Seizure-like activity ([place])  Hyperglycemia  Acute hypoxemic respiratory failure ([place])  ESRD (end stage renal disease) [facility])    Rx / DC Orders ED Discharge Orders     None        Amaryllis Dyke, PA-C 02/08/21 1601    Gareth Morgan, MD 02/08/21 2355

## 2021-02-08 NOTE — ED Triage Notes (Signed)
Pt arrived via [facility] with c/c of seizure. Per EMS pt is from home, pt was watchign tv and had a seizure that happened about 10 sec. Pt missed last dialysis on Friday. Fire states that pt had agonal breathing and weak pulses so they bagged pt then placed on NRB. Per family baseline is A&Ox4. Upon arrival pt is lethargic and not following commands.   220/120 --> 152/98, BS HI!, 100HR, 100% RA

## 2021-02-08 NOTE — H&P (Signed)
History and Physical    MARIK SEDORE DSK:[id number] DOB: April 08, 1986 DOA: 02/08/2021  PCP: Gildardo Pounds, NP (Confirm with patient/family/NH records and if not entered, this has to be entered at [facility] point of entry) Patient coming from: Home  I have personally briefly reviewed patient's old medical records in [place]  Chief Complaint:   HPI: [name] is a 35 y.o. male with medical history significant of ESRD on HD, IDDM off insulin, HTN, HLD, legally blind, presented with syncope versus seizure.  Patient postictal and confused, most history provided by patients family including his aunt and niece.  Patient missed his Friday dialysis saying "they forgot me".  He had a toenail peeled off last Thursday he went to see podiatry who started patient on doxycycline twice daily.  This morning, family reported the patient woke up complaining about headache, then sat down on the sofa and listen to TV (he is blind and and cannot watch TV but only listening).  Few minutes later, niece found the patient started to become less responsive and slide down and lying on the ground arms and legs twitching, no loss of urine or bowel movement.  Episode lasted about 15 minutes and patient woke up by himself but remained confused.  31-monthago, insulin was discontinued by one of his physicians, " they told him his diabetes is controlled, no need for insulin".  Family also reported the patient found of sweet food including regular sodas, fruits and sweet and might have drunk some Sprite in the fridge this morning while watching TV.  Right now, patient complains about globalized headache, aching-like, no feeling of nauseous or vomiting.  Denies any chest pain, no abdominal pain diarrhea no cough no fever chills.  ED Course: Patient is awake, blood pressure significantly elevated, blood work showed glucose> 700, CT head negative for acute findings.  Insulin drip started in ED.  COVID test positive in the  ED.  Review of Systems: As per HPI otherwise 14 point review of systems negative.    Past Medical History:  Diagnosis Date   Diabetes mellitus    ESRD on hemodialysis ([place]    High cholesterol    Hypertension     Past Surgical History:  Procedure Laterality Date   AV FISTULA PLACEMENT Left 08/11/2020   Procedure: LEFT UPPER EXTREMITY ARTERIOVENOUS (AV) FISTULA CREATION;  Surgeon: HCherre Robins MD;  [place] [facility]  Service: Vascular;  Laterality: Left;   FRACTURE SURGERY     I & D EXTREMITY Left 07/16/2014   Procedure: IRRIGATION AND DEBRIDEMENT EXTREMITY/LEFT INDEX FINGER;  Surgeon: KLeanora Cover MD;  Location[place] [facility]  Service: Orthopedics;  Laterality: Left;   IR PERC TUN PERIT CATH WO PORT S&I /IMAG  08/07/2020   IR [place] VASC ACCESS RIGHT  08/07/2020     reports that he has never smoked. He has never used smokeless tobacco. He reports previous alcohol use. He reports that he does not use drugs.  Allergies  Allergen Reactions   Amlodipine Nausea And Vomiting and Other (See Comments)    Patient was taking Amlodipine and Hydralazine at the same time, so the reactions came from one of the 2: Lethargy and an all-over feeling of NOT feeling well (also)   Hydralazine Nausea And Vomiting and Other (See Comments)    Patient was taking Hydralazine AND Amlodipine at the same time, so the reactions came from one of the 2: Lethargy and an all-over feeling of NOT feeling well (also)  Family History  Problem Relation Age of Onset   Cancer Mother    Stroke Father    Diabetes Father    Hypertension Father      Prior to Admission medications   Medication Sig Start Date End Date Taking? Authorizing Provider  atorvastatin (LIPITOR) 40 MG tablet Take 1 tablet (40 mg total) by mouth daily. Patient not taking: No sig reported 09/06/20   Gildardo Pounds, NP  atorvastatin (LIPITOR) 40 MG tablet TAKE 1 TABLET (40 MG TOTAL) BY MOUTH DAILY. 09/06/20 09/06/21  Gildardo Pounds, NP  Blood  Glucose Monitoring Suppl (TRUE METRIX METER) w/Device KIT Use as instructed. Check blood glucose level by fingerstick once per day. 09/06/20   Gildardo Pounds, NP  carvedilol (COREG) 12.5 MG tablet Take 1 tablet (12.5 mg total) by mouth 2 (two) times daily with a meal. 11/27/20   Lavina Hamman, MD  carvedilol (COREG) 12.5 MG tablet Take 1 tablet by mouth twice a day with meals 02/04/21     doxycycline (VIBRA-TABS) 100 MG tablet Take 1 tablet by mouth twice daily for 7 days 02/05/21     hydrALAZINE (APRESOLINE) 25 MG tablet Take 1 tablet (25 mg total) by mouth 3 (three) times daily. 11/27/20   Lavina Hamman, MD  hydrOXYzine (ATARAX/VISTARIL) 25 MG tablet Take 1 tablet (25 mg total) by mouth 3 (three) times daily as needed for itching. 11/27/20   Lavina Hamman, MD  imipramine (TOFRANIL) 25 MG tablet TAKE 1 TABLET (25 MG TOTAL) BY MOUTH AT BEDTIME. FOR DEPRESSION 09/06/20 09/06/21  Gildardo Pounds, NP  insulin detemir (LEVEMIR) 100 UNIT/ML injection Inject 0.15 mLs (15 Units total) into the skin daily. Patient taking differently: Inject 10 Units into the skin daily. 05/10/20   Amin, Jeanella Flattery, MD  losartan (COZAAR) 100 MG tablet Take 1 tablet by mouth once a day 02/04/21     losartan (COZAAR) 50 MG tablet TAKE 1 TABLET (50 MG TOTAL) BY MOUTH DAILY. 08/13/20 08/13/21  Antonieta Pert, MD  multivitamin (RENA-VIT) TABS tablet Take 1 tablet by mouth at bedtime. 11/27/20   Lavina Hamman, MD  Nutritional Supplements (FEEDING SUPPLEMENT, [name] CARB STEADY,) LIQD Take 237 mLs by mouth 3 (three) times daily between meals. 11/27/20   Lavina Hamman, MD  sevelamer carbonate (RENVELA) 800 MG tablet TAKE 1 TABLET (800 MG TOTAL) BY MOUTH THREE TIMES DAILY WITH MEALS. Patient not taking: No sig reported 08/13/20 08/13/21  Antonieta Pert, MD  torsemide (DEMADEX) 20 MG tablet TAKE 2 TABLETS (40 MG) DAILY ON NON HEMODIALYSIS DAYS 08/13/20 08/13/21  Antonieta Pert, MD  TRUEplus Lancets 28G MISC Use as instructed. Check blood glucose level by  fingerstick once per day. 09/06/20   Gildardo Pounds, NP  TRUEplus Lancets 28G MISC USE AS INSTRUCTED. CHECK BLOOD GLUCOSE LEVEL BY FINGERSTICK ONCE PER DAY. 09/06/20 09/06/21  Gildardo Pounds, NP    Physical Exam: Vitals:   02/08/21 1420 02/08/21 1430 02/08/21 1609 02/08/21 1700  BP:  (!) 186/127 (!) 182/117 (!) 194/118  Pulse: 93 90 87 89  Resp: (!) 22 (!) _0 Temp:   97.6 F (36.4 C) 97.9 F (36.6 C)  TempSrc:   Oral Oral  SpO2: 99% 91% 97% 100%  Weight: 49.9 kg     Height: 6' (1.829 m)       Constitutional: NAD, calm, comfortable Vitals:   02/08/21 1420 02/08/21 1430 02/08/21 1609 02/08/21 1700  BP:  (!) 186/127 (!) 182/117 Marland Kitchen)  194/118  Pulse: 93 90 87 89  Resp: (!) 22 (!) _0 Temp:   97.6 F (36.4 C) 97.9 F (36.6 C)  TempSrc:   Oral Oral  SpO2: 99% 91% 97% 100%  Weight: 49.9 kg     Height: 6' (1.829 m)      Eyes: Blind ENMT: Mucous membranes are moist. Posterior pharynx clear of any exudate or lesions.Normal dentition.  Neck: normal, supple, no masses, no thyromegaly Respiratory: clear to auscultation bilaterally, no wheezing, no crackles. Normal respiratory effort. No accessory muscle use.  Cardiovascular: Regular rate and rhythm, no murmurs / rubs / gallops. No extremity edema. 2+ pedal pulses. No carotid bruits.  Abdomen: no tenderness, no masses palpated. No hepatosplenomegaly. Bowel sounds positive.  Musculoskeletal: no clubbing / cyanosis. No joint deformity upper and lower extremities. Good ROM, no contractures. Normal muscle tone.  Skin: no rashes, lesions, ulcers. No induration Neurologic: CN 2-12 grossly intact. Sensation intact, DTR normal. Strength 5/5 in all 4.  Psychiatric: Normal judgment and insight. Alert and oriented x 3. Normal mood.   (  Labs on Admission: I have personally reviewed following labs and imaging studies  CBC: Recent Labs  Lab 02/08/21 1351 02/08/21 1401 02/08/21 1641  WBC 6.9  --   --   NEUTROABS 6.0  --   --    HGB 10.6* 12.6* 11.6*  HCT 34.8* 37.0* 34.0*  MCV 84.3  --   --   PLT 377  --   --    Basic Metabolic Panel: Recent Labs  Lab 02/08/21 1351 02/08/21 1401 02/08/21 1641  NA 124* 124* 124*  K 5.7* 5.6* 6.0*  CL 86* 89*  --   CO2 20*  --   --   GLUCOSE 789* >700*  --   BUN 69* 63*  --   CREATININE 13.19* 13.60*  --   CALCIUM 8.2*  --   --    GFR: Estimated Creatinine Clearance: 5.3 mL/min (A) (by C-G formula based on SCr of 13.6 mg/dL (H)). Liver Function Tests: Recent Labs  Lab 02/08/21 1351  AST 26  ALT 41  ALKPHOS 183*  BILITOT 1.2  PROT 7.5  ALBUMIN 3.3*   No results for input(s): LIPASE, AMYLASE in the last 168 hours. No results for input(s): AMMONIA in the last 168 hours. Coagulation Profile: No results for input(s): INR, PROTIME in the last 168 hours. Cardiac Enzymes: No results for input(s): CKTOTAL, CKMB, CKMBINDEX, TROPONINI in the last 168 hours. BNP (last 3 results) No results for input(s): PROBNP in the last 8760 hours. HbA1C: No results for input(s): HGBA1C in the last 72 hours. CBG: Recent Labs  Lab 02/08/21 1411 02/08/21 1609 02/08/21 1659  GLUCAP >600* >600* >600*   Lipid Profile: No results for input(s): CHOL, HDL, LDLCALC, TRIG, CHOLHDL, LDLDIRECT in the last 72 hours. Thyroid Function Tests: No results for input(s): TSH, T4TOTAL, FREET4, T3FREE, THYROIDAB in the last 72 hours. Anemia Panel: No results for input(s): VITAMINB12, FOLATE, FERRITIN, TIBC, IRON, RETICCTPCT in the last 72 hours. Urine analysis:    Component Value Date/Time   COLORURINE YELLOW 11/25/2020 0301   APPEARANCEUR CLEAR 11/25/2020 0301   LABSPEC 1.016 11/25/2020 0301   PHURINE 7.0 11/25/2020 0301   GLUCOSEU 50 (A) 11/25/2020 0301   HGBUR SMALL (A) 11/25/2020 0301   BILIRUBINUR NEGATIVE 11/25/2020 0301   KETONESUR NEGATIVE 11/25/2020 0301   PROTEINUR >=300 (A) 11/25/2020 0301   UROBILINOGEN 0.2 07/16/2014 1747   NITRITE NEGATIVE 11/25/2020 0301  LEUKOCYTESUR  NEGATIVE 11/25/2020 0301    Radiological Exams on Admission: CT Head Wo Contrast  Result Date: 02/08/2021 CLINICAL DATA:  Nontraumatic seizure. EXAM: CT HEAD WITHOUT CONTRAST TECHNIQUE: Contiguous axial images were obtained from the base of the skull through the vertex without intravenous contrast. COMPARISON:  Nov 26, 2020 FINDINGS: Brain: No evidence of acute infarction, hemorrhage, hydrocephalus, extra-axial collection or mass lesion/mass effect. Vascular: No hyperdense vessel or unexpected calcification. Skull: Normal. Negative for fracture or focal lesion. Sinuses/Orbits: No acute finding. Other: None. IMPRESSION: No acute intracranial abnormalities are identified. Electronically Signed   By: Dorise Bullion III M.D   On: 02/08/2021 15:09   DG Chest Portable 1 View  Result Date: 02/08/2021 CLINICAL DATA:  Cough,CP. EXAM: PORTABLE CHEST 1 VIEW COMPARISON:  Chest radiograph 11/25/2020 FINDINGS: Interval removal of a right central venous catheter. Stable cardiomediastinal contours with enlarged heart size. Low lung volumes. There is central vascular congestion and slight diffuse bilateral interstitial thickening. No new focal consolidation. No pneumothorax or large pleural effusion. No acute finding in the visualized skeleton. IMPRESSION: Findings suggestive of mild interstitial edema. No new focal consolidation. Electronically Signed   By: Audie Pinto M.D.   On: 02/08/2021 14:08    EKG: Independently reviewed.  Sinus, no PR or QTc interval issue.  Assessment/Plan Active Problems:   Syncope   DKA (diabetic ketoacidosis) ([place])  (please populate well all problems here in Problem List. (For example, if patient is on BP meds at home and you resume or decide to hold them, it is a problem that needs to be her. Same for CAD, COPD, HLD and so on)  AMS -HHS/DKA with coma vs Syncope vs seizure -Suspect HHS with coma, given significant elevated of glucose which might have happened in short period  time given possible ingestion of large amount of sugar this morning.  Insulin drip, without IV bolus or maintenance fluids for ESRD and fluid overload. -Syncope is also possible given the significant elevated blood pressure, patient insisted that he has been compliant with all his BP meds but cannot give me the name of the medication he took this morning.  Check orthostatic vital signs, echocardiogram.  Stringent control with blood pressure.  Resume home BP meds, as needed IV hydralazine.  Emergency dialysis, nephrology aware. -Seizure considerably less likely given other significant findings such as HHS/DKA and HTN emergency.  EEG for baseline, seizure precautions.  HHS versus DKA -As above.  Hx of IDDM -HbA1C 7.4 in May 2022, likely today status post caused by ingestion of large amount of carbohydrate food this morning, recheck A1c, may need as least a PO DM meds to go home. Also consulted diabetic coordinator for diet recommendation to prevent future uncontrolled diabetes/DKA/HHS.  HTN emergency -Emergency dialysis -Resume home BP meds Coreg, hydralazine hold ARB for hyperkalemia -Change p.o. Lasix to IV.  Acute on chronic diastolic CHF decompensation -As above.  COVID-19 infection -No symptoms or signs of COVID-pneumonia, chest x-ray showed signs more compatible with fluid overload/diastolic CHF decompensation. -Given significant comorbidities at baseline, decided to give monoclonal antibody infusion x1,  Hyperkalemia -Emergency dialysis.  Combined non-anion gap and anion gap metabolic acidosis -Secondary to missed HD and DKA, insulin drip to treat DKA and emergency HD.  Right toes ulcer -Consult wound care, continue doxycycline as per podiatry patient.  ESRD on HD -As above.  DVT prophylaxis: Heparin subQ Code Status: Full Code Family Communication: Aunt over phone Disposition Plan: Expect 1 to 2 days hospital stay for insulin drip,  fluid overload/CHF  decompensation Consults called: Nephro Admission status: PCU   Lequita Halt MD [facility] Pager [phone]  02/08/2021, 5:21 PM

## 2021-02-08 NOTE — ED Notes (Signed)
E.S. MD made aware of Covid+ result. Labs drawn and sent and insulin drip started as ordered

## 2021-02-08 NOTE — ED Provider Notes (Signed)
Care assumed from [facility], PA-C at shift change with admission pending.   In brief, this patient is a 35 y.o. M past ministry of diabetes, end-stage renal disease who presents for evaluation of possible seizure activity.  He missed his dialysis session on Friday.  Today had a 10-second episode of possible seizure activity.  On EMS arrival, he was confused, had some agonal respirations.  He was bagged.  He started having improve mentation but was still drowsy.  Please see note from previous provider for full history/physical exam.   Friday missed dialysis Today had a 10 sec sezxiure was agonal ... gag res ... bagged Confused better a little still confused  No focal deficits   Improved mental   HHS  K 5/7   [place]at while he was here ... on 2 L O2 no baseline  [place] ... nephro dialysis  Insulin drip  First time seizure    Physical Exam  BP (!) 189/125 (BP Location: Right Arm)   Pulse 89   Temp 97.9 F (36.6 C) (Oral)   Resp 19   Ht 6' (1.829 m)   Wt 49.9 kg   SpO2 95%   BMI 14.92 kg/m   Physical Exam  ED Course/Procedures     Procedures  Results for orders placed or performed during the hospital encounter of 02/08/21 (from the past 24 hour(s))  Comprehensive metabolic panel     Status: Abnormal   Collection Time: 02/08/21  1:51 PM  Result Value Ref Range   Sodium 124 (L) 135 - 145 mmol/L   Potassium 5.7 (H) 3.5 - 5.1 mmol/L   Chloride 86 (L) 98 - 111 mmol/L   CO2 20 (L) 22 - 32 mmol/L   Glucose, Bld 789 (HH) 70 - 99 mg/dL   BUN 69 (H) 6 - 20 mg/dL   Creatinine, Ser 13.19 (H) 0.61 - 1.24 mg/dL   Calcium 8.2 (L) 8.9 - 10.3 mg/dL   Total Protein 7.5 6.5 - 8.1 g/dL   Albumin 3.3 (L) 3.5 - 5.0 g/dL   AST 26 15 - 41 U/L   ALT 41 0 - 44 U/L   Alkaline Phosphatase 183 (H) 38 - 126 U/L   Total Bilirubin 1.2 0.3 - 1.2 mg/dL   GFR, Estimated 5 (L) >60 mL/min   Anion gap 18 (H) 5 - 15  CBC with Differential     Status: Abnormal   Collection Time: 02/08/21   1:51 PM  Result Value Ref Range   WBC 6.9 4.0 - 10.5 K/uL   RBC 4.13 (L) 4.22 - 5.81 MIL/uL   Hemoglobin 10.6 (L) 13.0 - 17.0 g/dL   HCT 34.8 (L) 39.0 - 52.0 %   MCV 84.3 80.0 - 100.0 fL   MCH 25.7 (L) 26.0 - 34.0 pg   MCHC 30.5 30.0 - 36.0 g/dL   RDW 18.6 (H) 11.5 - 15.5 %   Platelets 377 150 - 400 K/uL   nRBC 0.0 0.0 - 0.2 %   Neutrophils Relative % 89 %   Neutro Abs 6.0 1.7 - 7.7 K/uL   Lymphocytes Relative 5 %   Lymphs Abs 0.4 (L) 0.7 - 4.0 K/uL   Monocytes Relative 5 %   Monocytes Absolute 0.4 0.1 - 1.0 K/uL   Eosinophils Relative 1 %   Eosinophils Absolute 0.1 0.0 - 0.5 K/uL   Basophils Relative 0 %   Basophils Absolute 0.0 0.0 - 0.1 K/uL   Immature Granulocytes 0 %   Abs Immature Granulocytes 0.03  0.00 - 0.07 K/uL  I-stat chem 8, ED (not at [facility] or [facility])     Status: Abnormal   Collection Time: 02/08/21  2:01 PM  Result Value Ref Range   Sodium 124 (L) 135 - 145 mmol/L   Potassium 5.6 (H) 3.5 - 5.1 mmol/L   Chloride 89 (L) 98 - 111 mmol/L   BUN 63 (H) 6 - 20 mg/dL   Creatinine, Ser 13.60 (H) 0.61 - 1.24 mg/dL   Glucose, Bld >700 (HH) 70 - 99 mg/dL   Calcium, Ion 0.99 (L) 1.15 - 1.40 mmol/L   TCO2 21 (L) 22 - 32 mmol/L   Hemoglobin 12.6 (L) 13.0 - 17.0 g/dL   HCT 37.0 (L) 39.0 - 52.0 %   Comment NOTIFIED PHYSICIAN   Resp Panel by RT-PCR (Flu A&B, Covid) Nasopharyngeal Swab     Status: Abnormal   Collection Time: 02/08/21  2:08 PM   Specimen: Nasopharyngeal Swab; Nasopharyngeal(NP) swabs in vial transport medium  Result Value Ref Range   SARS Coronavirus 2 by RT PCR POSITIVE (A) NEGATIVE   Influenza A by PCR NEGATIVE NEGATIVE   Influenza B by PCR NEGATIVE NEGATIVE  POC CBG, ED     Status: Abnormal   Collection Time: 02/08/21  2:11 PM  Result Value Ref Range   Glucose-Capillary >600 (HH) 70 - 99 mg/dL   Comment 1 Notify RN    Comment 2 Document in Chart   Lactic acid, plasma     Status: Abnormal   Collection Time: 02/08/21  2:25 PM  Result Value Ref Range    Lactic Acid, Venous 3.9 (HH) 0.5 - 1.9 mmol/L  Ethanol     Status: None   Collection Time: 02/08/21  2:25 PM  Result Value Ref Range   Alcohol, Ethyl (B) <10 <10 mg/dL  CBG monitoring, ED     Status: Abnormal   Collection Time: 02/08/21  4:09 PM  Result Value Ref Range   Glucose-Capillary >600 (HH) 70 - 99 mg/dL  Lactic acid, plasma     Status: Abnormal   Collection Time: 02/08/21  4:30 PM  Result Value Ref Range   Lactic Acid, Venous 2.6 (HH) 0.5 - 1.9 mmol/L  Basic metabolic panel     Status: Abnormal   Collection Time: 02/08/21  4:30 PM  Result Value Ref Range   Sodium 124 (L) 135 - 145 mmol/L   Potassium 6.2 (H) 3.5 - 5.1 mmol/L   Chloride 87 (L) 98 - 111 mmol/L   CO2 20 (L) 22 - 32 mmol/L   Glucose, Bld 783 (HH) 70 - 99 mg/dL   BUN 71 (H) 6 - 20 mg/dL   Creatinine, Ser 13.19 (H) 0.61 - 1.24 mg/dL   Calcium 8.2 (L) 8.9 - 10.3 mg/dL   GFR, Estimated 5 (L) >60 mL/min   Anion gap 17 (H) 5 - 15  I-Stat Venous Blood Gas, ED     Status: Abnormal   Collection Time: 02/08/21  4:41 PM  Result Value Ref Range   pH, Ven 7.423 7.250 - 7.430   pCO2, Ven 34.9 (L) 44.0 - 60.0 mmHg   pO2, Ven 58.0 (H) 32.0 - 45.0 mmHg   Bicarbonate 22.8 20.0 - 28.0 mmol/L   TCO2 24 22 - 32 mmol/L   O2 Saturation 91.0 %   Acid-base deficit 1.0 0.0 - 2.0 mmol/L   Sodium 124 (L) 135 - 145 mmol/L   Potassium 6.0 (H) 3.5 - 5.1 mmol/L   Calcium, Ion 0.92 (L) 1.15 -  1.40 mmol/L   HCT 34.0 (L) 39.0 - 52.0 %   Hemoglobin 11.6 (L) 13.0 - 17.0 g/dL   Sample type VENOUS   CBG monitoring, ED     Status: Abnormal   Collection Time: 02/08/21  4:59 PM  Result Value Ref Range   Glucose-Capillary >600 (HH) 70 - 99 mg/dL  Beta-hydroxybutyric acid     Status: Abnormal   Collection Time: 02/08/21  5:12 PM  Result Value Ref Range   Beta-Hydroxybutyric Acid 0.53 (H) 0.05 - 0.27 mmol/L    MDM    PLAN: Plan for admission.   MDM: Potassium is 5.7.  Creatinine elevated.  He is hyperglycemic with a high anion gap.   Bicarb is 20.  Patient started on insulin drip.  Previous provider has talked to Dr. Soyla Murphy with nephrology.  They will plan for dialysis.  Patient is COVID-positive.  He is requiring 2 L of O2.  He does not have oxygen at baseline.  We will plan for admission.  Discussed patient with Dr. Markus Jarvis (hospitalist) who accepts patient for admission.   1. Seizure-like activity ([place])   2. Hyperglycemia   3. Acute hypoxemic respiratory failure (H[facility])   4. ESRD (end stage renal disease) ([place])     Portions of this note were generated with [employer]. Dictation errors may occur despite best attempts at proofreading.     Volanda Napoleon, PA-C 02/08/21 [place], [place], DO 02/08/21 [phone]

## 2021-02-08 NOTE — ED Notes (Signed)
Report given to Britany W, RN  

## 2021-02-08 NOTE — ED Notes (Signed)
PT was heard vomiting all over the floor and side of bed. Pt was then cleaned up with all linens changed and put back in the bed. MD messaged for IV meds

## 2021-02-08 NOTE — ED Notes (Signed)
Tried to call report to floor. Not taking the pt on 4E. Per charge RN pt not appropriate for that floor

## 2021-02-08 NOTE — ED Notes (Signed)
PT placed on 2L [place]. He drops to mid 80's when he is sleeping

## 2021-02-08 NOTE — ED Notes (Signed)
RN has been in the pt room for over an hour. Pt had a BM all in the bed and on the floor. PT trying to pull leads off and walk around asking for a shower. PT very hard to redirect but was able to clean him and get him back in the bed.

## 2021-02-09 ENCOUNTER — Inpatient Hospital Stay (HOSPITAL_COMMUNITY): Payer: Medicaid Other

## 2021-02-09 DIAGNOSIS — R55 Syncope and collapse: Secondary | ICD-10-CM

## 2021-02-09 DIAGNOSIS — N186 End stage renal disease: Secondary | ICD-10-CM | POA: Diagnosis not present

## 2021-02-09 DIAGNOSIS — R569 Unspecified convulsions: Secondary | ICD-10-CM | POA: Diagnosis not present

## 2021-02-09 DIAGNOSIS — R739 Hyperglycemia, unspecified: Secondary | ICD-10-CM | POA: Diagnosis not present

## 2021-02-09 LAB — COMPREHENSIVE METABOLIC PANEL
ALT: 36 U/L (ref 0–44)
AST: 19 U/L (ref 15–41)
Albumin: 3.1 g/dL — ABNORMAL LOW (ref 3.5–5.0)
Alkaline Phosphatase: 134 U/L — ABNORMAL HIGH (ref 38–126)
Anion gap: 17 — ABNORMAL HIGH (ref 5–15)
BUN: 73 mg/dL — ABNORMAL HIGH (ref 6–20)
CO2: 23 mmol/L (ref 22–32)
Calcium: 8.5 mg/dL — ABNORMAL LOW (ref 8.9–10.3)
Chloride: 91 mmol/L — ABNORMAL LOW (ref 98–111)
Creatinine, Ser: 13.43 mg/dL — ABNORMAL HIGH (ref 0.61–1.24)
GFR, Estimated: 4 mL/min — ABNORMAL LOW (ref >60)
Glucose, Bld: 153 mg/dL — ABNORMAL HIGH (ref 70–99)
Potassium: 4.9 mmol/L (ref 3.5–5.1)
Sodium: 131 mmol/L — ABNORMAL LOW (ref 135–145)
Total Bilirubin: 1.2 mg/dL (ref 0.3–1.2)
Total Protein: 6.7 g/dL (ref 6.5–8.1)

## 2021-02-09 LAB — CBC WITH DIFFERENTIAL/PLATELET
Abs Immature Granulocytes: 0.05 10*3/uL (ref 0.00–0.07)
Basophils Absolute: 0 10*3/uL (ref 0.0–0.1)
Basophils Relative: 0 %
Eosinophils Absolute: 0.1 10*3/uL (ref 0.0–0.5)
Eosinophils Relative: 1 %
HCT: 28.7 % — ABNORMAL LOW (ref 39.0–52.0)
Hemoglobin: 9.7 g/dL — ABNORMAL LOW (ref 13.0–17.0)
Immature Granulocytes: 1 %
Lymphocytes Relative: 7 %
Lymphs Abs: 0.7 10*3/uL (ref 0.7–4.0)
MCH: 26.9 pg (ref 26.0–34.0)
MCHC: 33.8 g/dL (ref 30.0–36.0)
MCV: 79.7 fL — ABNORMAL LOW (ref 80.0–100.0)
Monocytes Absolute: 0.7 10*3/uL (ref 0.1–1.0)
Monocytes Relative: 6 %
Neutro Abs: 9.2 10*3/uL — ABNORMAL HIGH (ref 1.7–7.7)
Neutrophils Relative %: 85 %
Platelets: 367 10*3/uL (ref 150–400)
RBC: 3.6 MIL/uL — ABNORMAL LOW (ref 4.22–5.81)
RDW: 18.4 % — ABNORMAL HIGH (ref 11.5–15.5)
WBC: 10.8 10*3/uL — ABNORMAL HIGH (ref 4.0–10.5)
nRBC: 0 % (ref 0.0–0.2)

## 2021-02-09 LAB — GLUCOSE, CAPILLARY
Glucose-Capillary: 125 mg/dL — ABNORMAL HIGH (ref 70–99)
Glucose-Capillary: 179 mg/dL — ABNORMAL HIGH (ref 70–99)
Glucose-Capillary: 225 mg/dL — ABNORMAL HIGH (ref 70–99)
Glucose-Capillary: 300 mg/dL — ABNORMAL HIGH (ref 70–99)
Glucose-Capillary: 298 mg/dL — ABNORMAL HIGH (ref 70–99)

## 2021-02-09 LAB — D-DIMER, QUANTITATIVE: D-Dimer, Quant: 1.31 ug/mL-FEU — ABNORMAL HIGH (ref 0.00–0.50)

## 2021-02-09 LAB — BASIC METABOLIC PANEL
Anion gap: 16 — ABNORMAL HIGH (ref 5–15)
BUN: 75 mg/dL — ABNORMAL HIGH (ref 6–20)
CO2: 22 mmol/L (ref 22–32)
Calcium: 8.4 mg/dL — ABNORMAL LOW (ref 8.9–10.3)
Chloride: 92 mmol/L — ABNORMAL LOW (ref 98–111)
Creatinine, Ser: 13.58 mg/dL — ABNORMAL HIGH (ref 0.61–1.24)
GFR, Estimated: 4 mL/min — ABNORMAL LOW (ref >60)
Glucose, Bld: 154 mg/dL — ABNORMAL HIGH (ref 70–99)
Potassium: 4.9 mmol/L (ref 3.5–5.1)
Sodium: 130 mmol/L — ABNORMAL LOW (ref 135–145)

## 2021-02-09 LAB — ECHOCARDIOGRAM LIMITED
Calc EF: 49 %
Height: 72 in
S' Lateral: 3.7 cm
Single Plane A2C EF: 50.5 %
Single Plane A4C EF: 46 %
Weight: 3294.55 oz

## 2021-02-09 LAB — CBG MONITORING, ED: Glucose-Capillary: 113 mg/dL — ABNORMAL HIGH (ref 70–99)

## 2021-02-09 LAB — HEMOGLOBIN A1C
Hgb A1c MFr Bld: 12.9 % — ABNORMAL HIGH (ref 4.8–5.6)
Mean Plasma Glucose: 324 mg/dL

## 2021-02-09 LAB — BETA-HYDROXYBUTYRIC ACID
Beta-Hydroxybutyric Acid: 0.09 mmol/L (ref 0.05–0.27)
Beta-Hydroxybutyric Acid: 0.57 mmol/L — ABNORMAL HIGH (ref 0.05–0.27)

## 2021-02-09 LAB — MAGNESIUM: Magnesium: 2.4 mg/dL (ref 1.7–2.4)

## 2021-02-09 LAB — FERRITIN: Ferritin: 732 ng/mL — ABNORMAL HIGH (ref 24–336)

## 2021-02-09 LAB — C-REACTIVE PROTEIN: CRP: 0.8 mg/dL (ref <1.0)

## 2021-02-09 LAB — PHOSPHORUS: Phosphorus: 7.1 mg/dL — ABNORMAL HIGH (ref 2.5–4.6)

## 2021-02-09 MED ORDER — CHLORHEXIDINE GLUCONATE CLOTH 2 % EX PADS
6.0000 | MEDICATED_PAD | Freq: Every day | CUTANEOUS | Status: DC
  Administered 2021-02-09 – 2021-02-10 (×2): 6 via TOPICAL

## 2021-02-09 MED ORDER — TORSEMIDE 20 MG PO TABS
40.0000 mg | ORAL_TABLET | ORAL | Status: DC
  Administered 2021-02-10 – 2021-02-17 (×5): 40 mg via ORAL
  Filled 2021-02-09 (×6): qty 2

## 2021-02-09 MED ORDER — DOXERCALCIFEROL 4 MCG/2ML IV SOLN
5.0000 ug | INTRAVENOUS | Status: DC
  Filled 2021-02-09 (×4): qty 4

## 2021-02-09 NOTE — Progress Notes (Addendum)
Inpatient Diabetes Program Recommendations  AACE/ADA: New Consensus Statement on Inpatient Glycemic Control   Target Ranges:  Prepandial:   less than 140 mg/dL      Peak postprandial:   less than 180 mg/dL (1-2 hours)      Critically ill patients:  140 - 180 mg/dL  Results for ARNAV, GARRITY (MRN [id number]) as of 02/09/2021 07:40  Ref. Range 02/08/2021 23:05 02/09/2021 00:50 02/09/2021 06:34  Glucose-Capillary Latest Ref Range: 70 - 99 mg/dL 113 (H) 125 (H) 179 (H)   Results for MARDELL, HILLERS (MRN [id number]) as of 02/09/2021 07:40  Ref. Range 02/08/2021 13:51 02/08/2021 14:01 02/08/2021 16:30 02/08/2021 17:12 02/08/2021 19:05 02/08/2021 23:17  Beta-Hydroxybutyric Acid Latest Ref Range: 0.05 - 0.27 mmol/L    0.53 (H)  0.09  Glucose Latest Ref Range: 70 - 99 mg/dL 789 (HH) >700 (HH) 783 (HH)  500 (H) 99   Results for ABDULMOHSEN, SIEFKEN (MRN [id number]) as of 02/09/2021 07:40  Ref. Range 08/07/2020 02:58 11/25/2020 00:06  Hemoglobin A1C Latest Ref Range: 4.8 - 5.6 % 6.0 (H) 7.2 (H)   Review of Glycemic Control  Diabetes history: DM2 Outpatient Diabetes medications: Levemir 10 units daily Current orders for Inpatient glycemic control: Semglee 5 units QHS, Novlog 0-6 units TID with meals, Novolog 0-5 units QHS  Inpatient Diabetes Program Recommendations:    Insulin: Noted patient received Semglee 5 units at 6:45 am today.   NOTE: Noted consult for diabetes coordinator to diet education for patient's aunt. Will place consult for RD regarding diet education. In reviewing chart, noted patient has DM2 and has Levemir 10 units daily ordered for glycemic control. Per H&P, "82-monthago, insulin was discontinued by one of his physicians, " they told him his diabetes is controlled, no need for insulin".  Family also reported the patient found of sweet food including regular sodas, fruits and sweet and might have drunk some Sprite in the fridge this morning while watching TV."  Patient with hx of ESRD with HD (missed  HD on Friday) and is blind. Per chart, patient has Medicaid and [facility](last televist was 09/03/20; was no show for 01/08/21 televist).  Patient's initial lab glucose 783 mg/dl on 02/08/21 and patient was started on IV insulin which has been transitioned to SQ insulin. Will follow trends.  Addendum 02/09/21'@13'$ :05-Diabetes coordinator working remotely. Called CMarchelle Folks(patient's aunt at [phone] twice but no answer and not able to leave message due to mailbox full.  Called patient's room and spoke with patient briefly before he hung up. Patient states that his aunt (Marchelle Folks helps him with his DM management and that he has not been taking any DM medication recently. Patient confirms that he was on Levemir in the past but when asked how long it has been since Levemir taken patient stated, "I don't know."  Informed patient I tried to call patient's aunt CAngela Nevinbut she did not answer. Patient states she is probably at work; asked about her work hours and he said it changes but pretty much 12-12.  Asked patient about glucose monitoring at home and he stated that he has not been checking glucose at home since he has been on dialysis. When asked when he last checked glucose and if he has needed supplies for glucose monitoring at home, the phone was disconnected (patient hung up). No answer on room phone when called back. Will attempt to try to call patient's aunt CMarchelle Folksagain later today.  Thanks, Barnie Alderman, RN, MSN, CDE Diabetes Coordinator Inpatient Diabetes Program [phone] (Team Pager from 8am to 5pm)

## 2021-02-09 NOTE — Progress Notes (Signed)
Progress Note    [name]  [id number] DOB: 10-09-1985  DOA: 02/08/2021 PCP: Gildardo Pounds, NP    Brief Narrative:     Medical records reviewed and are as summarized below:  [name] is an 35 y.o. male with medical history significant of ESRD on HD, IDDM off insulin, HTN, HLD, legally blind, presented with syncope versus seizure.  Triage in the ER: Pt arrived via [facility] with c/c of seizure. Per EMS pt is from home, pt was watchign tv and had a seizure that happened about 10 sec. Pt missed last dialysis on Friday. Fire states that pt had agonal breathing and weak pulses so they bagged pt then placed on NRB. Per family baseline is A&Ox4. Upon arrival pt is lethargic and not following commands   Assessment/Plan:   Active Problems:   Acute hypoxemic respiratory failure (HCC)   Syncope   DKA (diabetic ketoacidosis) (HCC)   AMS -? multifactorial -Suspect HHS with coma, given significant elevated of glucose which might have happened in short period time given possible ingestion of large amount of sugar this morning.  -EEG: ?  dysfunction on left temporal region-- neurology consult  HHS  -type 2 DM per chart -transitioned to SQ insulin -diabetic coordinator consult   HTN  -resume home meds  Acute on chronic diastolic CHF decompensation -volume controlled in HD.   COVID-19 infection -No symptoms or signs of COVID-pneumonia, chest x-ray showed signs more compatible with fluid overload/diastolic CHF decompensation. -Given significant comorbidities at baseline, decided to give monoclonal antibody infusion x1 by admitter   Hyperkalemia -resolved   Right toes ulcer -continue doxycycline as per podiatry -? Need for further imaging and WOC consult   ESRD on HD -nephrology consult    Family Communication/Anticipated D/C date and plan/Code Status   Code Status: Full Code.  Disposition Plan: Status is: Inpatient  Remains inpatient appropriate  because:Inpatient level of care appropriate due to severity of illness  Dispo: The patient is from: Home              Anticipated d/c is to: Home              Patient currently is not medically stable to d/c.   Difficult to place patient No         Medical Consultants:   Renal neurology   Subjective:   Getting echo  Objective:    Vitals:   02/08/21 2203 02/09/21 0034 02/09/21 0500 02/09/21 0824  BP: (!) 147/91 (!) 151/97  (!) 148/101  Pulse: 93 91  91  Resp: 15 20    Temp: 98.2 F (36.8 C) 98.1 F (36.7 C)  98.1 F (36.7 C)  TempSrc: Axillary Axillary  Oral  SpO2: 98% 99%  99%  Weight:   93.4 kg   Height:        Intake/Output Summary (Last 24 hours) at 02/09/2021 1431 Last data filed at 02/09/2021 [id number] Gross per 24 hour  Intake 60 ml  Output --  Net 60 ml   Filed Weights   02/08/21 1420 02/09/21 0500  Weight: 49.9 kg 93.4 kg    Exam:  General: Appearance:     Overweight male in no acute distress- getting echo     Lungs:    respirations unlabored       Data Reviewed:   I have personally reviewed following labs and imaging studies:  Labs: Labs show the following:   Basic Metabolic Panel: Recent Labs  Lab  02/08/21 1351 02/08/21 1401 02/08/21 1630 02/08/21 1641 02/08/21 1905 02/08/21 2317 02/09/21 0256  NA 124* 124* 124* 124* 128* 133* 131*  130*  K 5.7* 5.6* 6.2* 6.0* 5.0 4.4 4.9  4.9  CL 86* 89* 87*  --  90* 92* 91*  92*  CO2 20*  --  20*  --  '22 22 23  22  '$ GLUCOSE 789* >700* 783*  --  500* 99 153*  154*  BUN 69* 63* 71*  --  71* 72* 73*  75*  CREATININE 13.19* 13.60* 13.19*  --  13.13* 13.06* 13.43*  13.58*  CALCIUM 8.2*  --  8.2*  --  8.5* 8.7* 8.5*  8.4*  MG  --   --   --   --   --   --  2.4  PHOS  --   --   --   --   --   --  7.1*   GFR Estimated Creatinine Clearance: 9 mL/min (A) (by C-G formula based on SCr of 13.43 mg/dL (H)). Liver Function Tests: Recent Labs  Lab 02/08/21 1351 02/09/21 0256  AST 26 19  ALT 41  36  ALKPHOS 183* 134*  BILITOT 1.2 1.2  PROT 7.5 6.7  ALBUMIN 3.3* 3.1*   No results for input(s): LIPASE, AMYLASE in the last 168 hours. No results for input(s): AMMONIA in the last 168 hours. Coagulation profile No results for input(s): INR, PROTIME in the last 168 hours.  CBC: Recent Labs  Lab 02/08/21 1351 02/08/21 1401 02/08/21 1641 02/09/21 0256  WBC 6.9  --   --  10.8*  NEUTROABS 6.0  --   --  9.2*  HGB 10.6* 12.6* 11.6* 9.7*  HCT 34.8* 37.0* 34.0* 28.7*  MCV 84.3  --   --  79.7*  PLT 377  --   --  367   Cardiac Enzymes: No results for input(s): CKTOTAL, CKMB, CKMBINDEX, TROPONINI in the last 168 hours. BNP (last 3 results) No results for input(s): PROBNP in the last 8760 hours. CBG: Recent Labs  Lab 02/08/21 2126 02/08/21 2305 02/09/21 0050 02/09/21 0634 02/09/21 1255  GLUCAP 229* 113* 125* 179* 225*   D-Dimer: Recent Labs    02/08/21 1905 02/09/21 0256  DDIMER 1.38* 1.31*   Hgb A1c: No results for input(s): HGBA1C in the last 72 hours. Lipid Profile: No results for input(s): CHOL, HDL, LDLCALC, TRIG, CHOLHDL, LDLDIRECT in the last 72 hours. Thyroid function studies: No results for input(s): TSH, T4TOTAL, T3FREE, THYROIDAB in the last 72 hours.  Invalid input(s): FREET3 Anemia work up: Recent Labs    02/08/21 1905 02/09/21 0256  FERRITIN 773* 732*   Sepsis Labs: Recent Labs  Lab 02/08/21 1351 02/08/21 1425 02/08/21 1630 02/08/21 1905 02/09/21 0256  PROCALCITON  --   --   --  1.10  --   WBC 6.9  --   --   --  10.8*  LATICACIDVEN  --  3.9* 2.6*  --   --     Microbiology Recent Results (from the past 240 hour(s))  Resp Panel by RT-PCR (Flu A&B, Covid) Nasopharyngeal Swab     Status: Abnormal   Collection Time: 02/08/21  2:08 PM   Specimen: Nasopharyngeal Swab; Nasopharyngeal(NP) swabs in vial transport medium  Result Value Ref Range Status   SARS Coronavirus 2 by RT PCR POSITIVE (A) NEGATIVE Final    Comment: RESULT CALLED TO, READ  BACK BY AND VERIFIED WITH: B,WRIGHT '@1613'$  02/08/21 EB (NOTE) SARS-CoV-2 target nucleic acids are  DETECTED.  The SARS-CoV-2 RNA is generally detectable in upper respiratory specimens during the acute phase of infection. Positive results are indicative of the presence of the identified virus, but do not rule out bacterial infection or co-infection with other pathogens not detected by the test. Clinical correlation with patient history and other diagnostic information is necessary to determine patient infection status. The expected result is Negative.  Fact Sheet for Patients: EntrepreneurPulse.com.au  Fact Sheet for Healthcare Providers: IncredibleEmployment.be  This test is not yet approved or cleared by the [place] FDA and  has been authorized for detection and/or diagnosis of SARS-CoV-2 by FDA under an Emergency Use Authorization (EUA).  This EUA will remain in effect (meaning this test can be used) f or the duration of  the COVID-19 declaration under Section 564(b)(1) of the Act, 21 U.S.C. section 360bbb-3(b)(1), unless the authorization is terminated or revoked sooner.     Influenza A by PCR NEGATIVE NEGATIVE Final   Influenza B by PCR NEGATIVE NEGATIVE Final    Comment: (NOTE) The Xpert Xpress SARS-CoV-2/FLU/RSV plus assay is intended as an aid in the diagnosis of influenza from Nasopharyngeal swab specimens and should not be used as a sole basis for treatment. Nasal washings and aspirates are unacceptable for Xpert Xpress SARS-CoV-2/FLU/RSV testing.  Fact Sheet for Patients: EntrepreneurPulse.com.au  Fact Sheet for Healthcare Providers: IncredibleEmployment.be  This test is not yet approved or cleared by the [place] FDA and has been authorized for detection and/or diagnosis of SARS-CoV-2 by FDA under an Emergency Use Authorization (EUA). This EUA will remain in effect (meaning this test  can be used) for the duration of the COVID-19 declaration under Section 564(b)(1) of the Act, 21 U.S.C. section 360bbb-3(b)(1), unless the authorization is terminated or revoked.  Performed at [facility], [place] [address], [place], [place] [address]     Procedures and diagnostic studies:  CT Head Wo Contrast  Result Date: 02/08/2021 CLINICAL DATA:  Nontraumatic seizure. EXAM: CT HEAD WITHOUT CONTRAST TECHNIQUE: Contiguous axial images were obtained from the base of the skull through the vertex without intravenous contrast. COMPARISON:  Nov 26, 2020 FINDINGS: Brain: No evidence of acute infarction, hemorrhage, hydrocephalus, extra-axial collection or mass lesion/mass effect. Vascular: No hyperdense vessel or unexpected calcification. Skull: Normal. Negative for fracture or focal lesion. Sinuses/Orbits: No acute finding. Other: None. IMPRESSION: No acute intracranial abnormalities are identified. Electronically Signed   By: Dorise Bullion III M.D   On: 02/08/2021 15:09   DG Chest Portable 1 View  Result Date: 02/08/2021 CLINICAL DATA:  Cough,CP. EXAM: PORTABLE CHEST 1 VIEW COMPARISON:  Chest radiograph 11/25/2020 FINDINGS: Interval removal of a right central venous catheter. Stable cardiomediastinal contours with enlarged heart size. Low lung volumes. There is central vascular congestion and slight diffuse bilateral interstitial thickening. No new focal consolidation. No pneumothorax or large pleural effusion. No acute finding in the visualized skeleton. IMPRESSION: Findings suggestive of mild interstitial edema. No new focal consolidation. Electronically Signed   By: Audie Pinto M.D.   On: 02/08/2021 14:08   EEG adult  Result Date: 02/09/2021 Lora Havens, MD     02/09/2021 12:58 PM Patient Name: KEREL ZEIDERS MRN: [id number] Epilepsy Attending: Lora Havens Referring Physician/Provider: Dr Wynetta Fines Date: 02/09/2021 Duration: 21.41 mins Patient history: 34 year old male  presented with syncope, seizure-like activity and altered mental status.  EEG to evaluate for seizures. Level of alertness: Awake, asleep AEDs during EEG study: None Technical aspects: This EEG study was done with  scalp electrodes positioned according to the 10-20 International system of electrode placement. Electrical activity was acquired at a sampling rate of '500Hz'$  and reviewed with a high frequency filter of '70Hz'$  and a low frequency filter of '1Hz'$ . EEG data were recorded continuously and digitally stored. Description: The posterior dominant rhythm consists of 8 Hz activity of moderate voltage (25-35 uV) seen predominantly in posterior head regions, symmetric and reactive to eye opening and eye closing. Sleep was characterized by sleep spindles (12 to 14 Hz), maximal frontocentral region. EEG showed continuous generalized and maximal left temporal 3 to 6 Hz theta-delta slowing. Hyperventilation and photic stimulation were not performed.   ABNORMALITY - Continuous slow, generalized and maximal left temporal region IMPRESSION: This study is suggestive of cortical dysfunction in left temporal region which could be secondary to underlying structural abnormality, postictal state.  There is also mild diffuse encephalopathy, nonspecific etiology.  No seizures or epileptiform discharges were seen throughout the recording. Lora Havens   ECHOCARDIOGRAM LIMITED  Result Date: 02/09/2021    ECHOCARDIOGRAM LIMITED REPORT   Patient Name:   JYLON BRONIKOWSKI Petrov Date of Exam: 02/09/2021 Medical Rec #:  [id number]       Height:       72.0 in Accession #:    [id number]      Weight:       205.9 lb Date of Birth:  July 12, 1986       BSA:          2.157 m Patient Age:    56 years        BP:           148/101 mmHg Patient Gender: M               HR:           85 bpm. Exam Location:  Inpatient Procedure: Limited Echo, 3D Echo, Cardiac Doppler, Color Doppler and Strain            Analysis Indications:    R55 Syncope  History:        Patient  has prior history of Echocardiogram examinations, most                 recent 12/04/2019. Signs/Symptoms:Syncope, Altered Mental Status                 and Dyspnea; Risk Factors:Diabetes and Hypertension. Covid                 positive. ESRD. Hypoxia.  Sonographer:    Roseanna Rainbow RDCS Referring Phys: [id number] Lequita Halt  Sonographer Comments: Global longitudinal strain was attempted. IMPRESSIONS  1. Left ventricular ejection fraction, by estimation, is 45 to 50%. The left ventricle has mildly decreased function. The left ventricle demonstrates global hypokinesis. There is severe left ventricular hypertrophy. Left ventricular diastolic parameters  are indeterminate.  2. Right ventricular systolic function is normal. The right ventricular size is mildly enlarged. Moderately increased right ventricular wall thickness. There is moderately elevated pulmonary artery systolic pressure. The estimated right ventricular systolic pressure is [id number] mmHg.  3. The mitral valve is normal in structure. Trivial mitral valve regurgitation.  4. Tricuspid valve regurgitation is moderate.  5. The aortic valve is tricuspid. Aortic valve regurgitation is not visualized.  6. The inferior vena cava is dilated in size with <50% respiratory variability, suggesting right atrial pressure of 15 mmHg. FINDINGS  Left Ventricle: Left ventricular ejection fraction, by estimation, is 45 to 50%. The left ventricle has  mildly decreased function. The left ventricle demonstrates global hypokinesis. The left ventricular internal cavity size was normal in size. There is  severe left ventricular hypertrophy. Left ventricular diastolic parameters are indeterminate. Right Ventricle: The right ventricular size is mildly enlarged. Moderately increased right ventricular wall thickness. Right ventricular systolic function is normal. There is moderately elevated pulmonary artery systolic pressure. The tricuspid regurgitant velocity is 3.05 m/s, and with an assumed  right atrial pressure of 15 mmHg, the estimated right ventricular systolic pressure is [id number] mmHg. Pericardium: Trivial pericardial effusion is present. Mitral Valve: The mitral valve is normal in structure. Trivial mitral valve regurgitation. Tricuspid Valve: The tricuspid valve is normal in structure. Tricuspid valve regurgitation is moderate. Aortic Valve: The aortic valve is tricuspid. Aortic valve regurgitation is not visualized. Aorta: The aortic root is normal in size and structure. Venous: The inferior vena cava is dilated in size with less than 50% respiratory variability, suggesting right atrial pressure of 15 mmHg. LEFT VENTRICLE PLAX 2D LVIDd:         4.50 cm LVIDs:         3.70 cm LV PW:         2.30 cm LV IVS:        2.30 cm LVOT diam:     2.50 cm LVOT Area:     4.91 cm  LV Volumes (MOD) LV vol d, MOD A2C: 185.0 ml LV vol d, MOD A4C: 164.0 ml LV vol s, MOD A2C: 91.6 ml LV vol s, MOD A4C: 88.6 ml LV SV MOD A2C:     93.4 ml LV SV MOD A4C:     164.0 ml LV SV MOD BP:      87.3 ml IVC IVC diam: 2.90 cm LEFT ATRIUM         Index LA diam:    4.10 cm 1.90 cm/m   AORTA Ao Root diam: 3.90 cm Ao Asc diam:  3.30 cm TRICUSPID VALVE TR Peak grad:   37.2 mmHg TR Vmax:        305.00 cm/s  SHUNTS Systemic Diam: 2.50 cm Oswaldo Milian MD Electronically signed by Oswaldo Milian MD Signature Date/Time: 02/09/2021/1:41:58 PM    Final     Medications:    atorvastatin  40 mg Oral Daily   carvedilol  12.5 mg Oral BID WC   Chlorhexidine Gluconate Cloth  6 each Topical Q0600   doxercalciferol  5 mcg Intravenous Q M,W,F-HD   doxycycline  100 mg Oral Q12H   feeding supplement ([name] CARB STEADY)  237 mL Oral TID BM   heparin  5,000 Units Subcutaneous Q12H   hydrALAZINE  25 mg Oral TID   imipramine  25 mg Oral QHS   insulin aspart  0-5 Units Subcutaneous QHS   insulin aspart  0-6 Units Subcutaneous TID WC   insulin glargine-yfgn  5 Units Subcutaneous QHS   multivitamin  1 tablet Oral QHS   sevelamer  carbonate  1,600 mg Oral TID WC   sodium chloride flush  3 mL Intravenous Q12H   [START ON 02/10/2021] torsemide  40 mg Oral Q T,Th,S,Su   Continuous Infusions:  sodium chloride     promethazine (PHENERGAN) injection (IM or IVPB)       LOS: 1 day   Geradine Girt  [facility]   How to contact the [facility] Attending or Consulting provider [place] or covering provider during after hours [place], for this patient?  Check the care team in [facility] and look  for a) attending/consulting [place] provider listed and b) the [facility] team listed Log into [email or web address] and use [place] universal password to access. If you do not have the password, please contact the hospital operator. Locate the [facility] provider you are looking for under [facility] and page to a number that you can be directly reached. If you still have difficulty reaching the provider, please page the [facility] (Director on Call) for the Hospitalists listed on amion for assistance.  02/09/2021, 2:31 PM

## 2021-02-09 NOTE — Consult Note (Addendum)
Neurology Consultation Reason for Consult: ams, seizure-like activity Referring Physician: Dr Eulogio Bear  CC: ams, seizure-like activity  History is obtained from:patient, chart review  HPI: [name] is a 35 y.o. male with end-stage renal disease on hemodialysis Monday Wednesday Friday, hypertension, oral diabetes, horseshoe kidney, legally blind who presented after on 02/08/2021 with chief complaint seizure.  Per review of ED notes, patient was sitting on the couch watching TV when he suddenly lost consciousness and had generalized shaking lasting for about 10 seconds followed by confusion.  On arrival in the ED, blood glucose was 789, bicarb 20, anion gap of 18 with potassium of 5.7, lactic acid was elevated at 3.9. of note patient also missed his hemodialysis on 02/06/2021. Patient was started on insulin drip. CT head without contrast was performed on which did not show any acute abnormality.  Chest x-ray showed mild interstitial edema.  EEG was performed this morning which showed slowing in left temporal region concerning for underlying structural abnormality, postictal state.  Neurology was consulted for further management.  Patient denies ever having seizures in the past.  Denies any current headache, focal neurological deficits.  ROS: All other systems reviewed and negative except as noted in the HPI.   Past Medical History:  Diagnosis Date   Diabetes mellitus    ESRD on hemodialysis ([place])    High cholesterol    Hypertension     Family History  Problem Relation Age of Onset   Cancer Mother    Stroke Father    Diabetes Father    Hypertension Father     Social History:  reports that he has never smoked. He has never used smokeless tobacco. He reports previous alcohol use. He reports that he does not use drugs.   Medications Prior to Admission  Medication Sig Dispense Refill Last Dose   atorvastatin (LIPITOR) 40 MG tablet TAKE 1 TABLET (40 MG TOTAL) BY MOUTH DAILY. 90  tablet 3 Unknown   Blood Glucose Monitoring Suppl (TRUE METRIX METER) w/Device KIT Use as instructed. Check blood glucose level by fingerstick once per day. 1 kit 0    carvedilol (COREG) 6.25 MG tablet Take 6.25 mg by mouth 2 (two) times daily with a meal.   Unknown   doxycycline (VIBRA-TABS) 100 MG tablet Take 1 tablet by mouth twice daily for 7 days 14 tablet 0 Unknown   hydrALAZINE (APRESOLINE) 25 MG tablet Take 1 tablet (25 mg total) by mouth 3 (three) times daily. 90 tablet 0 Unknown   hydrOXYzine (ATARAX/VISTARIL) 25 MG tablet Take 1 tablet (25 mg total) by mouth 3 (three) times daily as needed for itching. 30 tablet 0 Unknown   imipramine (TOFRANIL) 25 MG tablet TAKE 1 TABLET (25 MG TOTAL) BY MOUTH AT BEDTIME. FOR DEPRESSION 90 tablet 1    insulin detemir (LEVEMIR) 100 UNIT/ML injection Inject 0.15 mLs (15 Units total) into the skin daily. (Patient taking differently: Inject 10 Units into the skin daily.) 10 mL 11    losartan (COZAAR) 50 MG tablet TAKE 1 TABLET (50 MG TOTAL) BY MOUTH DAILY. 60 tablet 0 Unknown   multivitamin (RENA-VIT) TABS tablet Take 1 tablet by mouth at bedtime. 30 tablet 0    Nutritional Supplements (FEEDING SUPPLEMENT, [name] CARB STEADY,) LIQD Take 237 mLs by mouth 3 (three) times daily between meals. 10000 mL 0    sevelamer carbonate (RENVELA) 800 MG tablet TAKE 1 TABLET (800 MG TOTAL) BY MOUTH THREE TIMES DAILY WITH MEALS. (Patient not taking: No sig reported)  90 tablet 0 Unknown   torsemide (DEMADEX) 20 MG tablet TAKE 2 TABLETS (40 MG) DAILY ON NON HEMODIALYSIS DAYS 48 tablet 1 Unknown   TRUEplus Lancets 28G MISC Use as instructed. Check blood glucose level by fingerstick once per day. 100 each 3    TRUEplus Lancets 28G MISC USE AS INSTRUCTED. CHECK BLOOD GLUCOSE LEVEL BY FINGERSTICK ONCE PER DAY. 100 each 3       Exam: Current vital signs: BP (!) 148/101 (BP Location: Right Arm)   Pulse 91   Temp 98.1 F (36.7 C) (Oral)   Resp 20   Ht 6' (1.829 m)   Wt 93.4  kg   SpO2 99%   BMI 27.93 kg/m  Vital signs in last 24 hours: Temp:  [97.9 F (36.6 C)-98.2 F (36.8 C)] 98.1 F (36.7 C) (08/01 0824) Pulse Rate:  [89-93] 91 (08/01 0824) Resp:  [15-20] 20 (08/01 0034) BP: (147-194)/(91-126) 148/101 (08/01 0824) SpO2:  [94 %-100 %] 99 % (08/01 0824) Weight:  [93.4 kg] 93.4 kg (08/01 0500)   Physical Exam  Constitutional: Appears well-developed and well-nourished.  Psych: Affect appropriate to situation Eyes: No scleral injection HENT: No OP obstrucion Head: Normocephalic.  Cardiovascular: Normal rate and regular rhythm.  Respiratory: Effort normal, non-labored breathing GI: Soft.  No distension. There is no tenderness.  Skin: Warm Neuro: AOX3, legally blind, rest of the CN 2-12 grossly intact, spontaneously moving all 4 extremities  I have reviewed labs in epic and the results pertinent to this consultation are: CBC:  Recent Labs  Lab 02/08/21 1351 02/08/21 1401 02/08/21 1641 02/09/21 0256  WBC 6.9  --   --  10.8*  NEUTROABS 6.0  --   --  9.2*  HGB 10.6*   < > 11.6* 9.7*  HCT 34.8*   < > 34.0* 28.7*  MCV 84.3  --   --  79.7*  PLT 377  --   --  367   < > = values in this interval not displayed.    Basic Metabolic Panel:  Lab Results  Component Value Date   NA 131 (L) 02/09/2021   NA 130 (L) 02/09/2021   K 4.9 02/09/2021   K 4.9 02/09/2021   CO2 23 02/09/2021   CO2 22 02/09/2021   GLUCOSE 153 (H) 02/09/2021   GLUCOSE 154 (H) 02/09/2021   BUN 73 (H) 02/09/2021   BUN 75 (H) 02/09/2021   CREATININE 13.43 (H) 02/09/2021   CREATININE 13.58 (H) 02/09/2021   CALCIUM 8.5 (L) 02/09/2021   CALCIUM 8.4 (L) 02/09/2021   GFRNONAA 4 (L) 02/09/2021   GFRNONAA 4 (L) 02/09/2021   GFRAA 19 (L) 03/10/2020   Lipid Panel:  Lab Results  Component Value Date   LDLCALC 74 09/16/2020   HgbA1c:  Lab Results  Component Value Date   HGBA1C 7.2 (H) 11/25/2020   Urine Drug Screen:     Component Value Date/Time   LABOPIA NONE DETECTED  11/25/2020 0301   COCAINSCRNUR NONE DETECTED 11/25/2020 0301   LABBENZ NONE DETECTED 11/25/2020 0301   AMPHETMU NONE DETECTED 11/25/2020 0301   THCU NONE DETECTED 11/25/2020 0301   LABBARB NONE DETECTED 11/25/2020 0301    Alcohol Level     Component Value Date/Time   ETH <10 02/08/2021 1425     I have reviewed the images obtained  CT Head without contrast 02/08/2021: No acute abnormality   ASSESSMENT/PLAN: 35 year old male with history of end-stage renal disease, diabetes who presented with new onset seizure like episode in  the setting of DKA.  Provoked seizure Diabetic ketoacidosis COVID-19 End-stage renal disease on hemodialysis Hyponatremia Hypoalbuminemia Leukocytosis Microcytic anemia -Routine EEG showed continuous slowing generalized, maximal left temporal region which is suggestive of cortical dysfunction in left temporal region likely secondary generalized acute abnormality or postictal state.  No definite ictal or interictal activity was noted. -This could be provoked seizure in the setting of DKA -Leukocytosis likely secondary to seizure, COVID-19.  Hyponatremia likely due to DKA  Recommendations: -Recommend MRI brain without contrast to assess for any acute abnormality -Will not start any AEDs yet unless patient has any further seizures and this was most likely a provoked seizure -Continue seizure precautions - As needed IV Ativan 2 mg for clinical seizure-like activity -Management of rest of comorbidities per primary team  I have spent a total of  125 minutes with the patient reviewing hospital notes,  test results, labs and examining the patient as well as establishing an assessment and plan that was discussed personally with the patient.  > 50% of time was spent in direct patient care.   Thank you for allowing [place] to participate in the care of this patient. If you have any further questions, please contact  me or neurohospitalist.   Zeb Comfort Epilepsy [facility] neurohospitalist

## 2021-02-09 NOTE — ED Notes (Signed)
Report given to Brenda RN

## 2021-02-09 NOTE — ED Notes (Signed)
PT changed, sheets changed. Pt and bed completely covered in stool. PT cleaned with towels soap and water

## 2021-02-09 NOTE — Consult Note (Addendum)
[facility] Renal Consultation Note    Indication for Consultation:  Management of ESRD/hemodialysis; anemia, hypertension/volume and secondary hyperparathyroidism PCP: Geryl Rankins NP  HPI: [name] is a 35 y.o. male with ESRD on hemodialysis MWF at [place]: PMH: HTN, DM, [place] kidney, legally blind. H/O medical noncompliance but this has improved since starting HD. Last HD 02/04/2021.  Ran full tx. Antihypertensive medications adjusted upward at this treatment.   Patient presented to ED 02/08/2021 for syncope VS seizure, HTN urgency, hyperkalemia. He missed HD 02/06/2021 D/T transportation issues. Upon arrival to ED, Na 128 K+5.0 BS 500 CO2 22 AG 16 SCr 13.13 BUN 71. WBC 6.9 HGB 10.6 PLT 377. CT of head: No acute intracranial abnormalities are identified. CXR with mild interstitial edema. Insulin gtt started in ED. Tested positive for COVID 19. He has been admitted per primary with HHS VS DKA, hypertensive emergency. I saw him in his room.  He did not remember what happened and seems to be back to baseline MS wise.  He says his vision which was not good is pretty much gone now-  worried that he had a CVA.  but has no other focal neurologic sxms.  His sugar is back down  Past Medical History:  Diagnosis Date   Diabetes mellitus    ESRD on hemodialysis ([place])    High cholesterol    Hypertension    Past Surgical History:  Procedure Laterality Date   AV FISTULA PLACEMENT Left 08/11/2020   Procedure: LEFT UPPER EXTREMITY ARTERIOVENOUS (AV) FISTULA CREATION;  Surgeon: Cherre Robins, MD;  Location: [facility];  Service: Vascular;  Laterality: Left;   FRACTURE SURGERY     I & D EXTREMITY Left 07/16/2014   Procedure: IRRIGATION AND DEBRIDEMENT EXTREMITY/LEFT INDEX FINGER;  Surgeon: Leanora Cover, MD;  Location: [facility];  Service: Orthopedics;  Laterality: Left;   IR PERC TUN PERIT CATH WO PORT S&I /IMAG  08/07/2020   IR US GUIDE VASC ACCESS RIGHT   08/07/2020   Family History  Problem Relation Age of Onset   Cancer Mother    Stroke Father    Diabetes Father    Hypertension Father    Social History:  reports that he has never smoked. He has never used smokeless tobacco. He reports previous alcohol use. He reports that he does not use drugs. Allergies  Allergen Reactions   Amlodipine Nausea And Vomiting and Other (See Comments)    Patient was taking Amlodipine and Hydralazine at the same time, so the reactions came from one of the 2: Lethargy and an all-over feeling of NOT feeling well (also)   Hydralazine Nausea And Vomiting and Other (See Comments)    Patient was taking Hydralazine AND Amlodipine at the same time, so the reactions came from one of the 2: Lethargy and an all-over feeling of NOT feeling well (also)   Prior to Admission medications   Medication Sig Start Date End Date Taking? Authorizing Provider  atorvastatin (LIPITOR) 40 MG tablet TAKE 1 TABLET (40 MG TOTAL) BY MOUTH DAILY. 09/06/20 09/06/21  Gildardo Pounds, NP  Blood Glucose Monitoring Suppl (TRUE METRIX METER) w/Device KIT Use as instructed. Check blood glucose level by fingerstick once per day. 09/06/20   Gildardo Pounds, NP  carvedilol (COREG) 6.25 MG tablet Take 6.25 mg by mouth 2 (two) times daily with a meal.    [provider]  doxycycline (VIBRA-TABS) 100 MG tablet Take 1 tablet by mouth twice daily for 7  days 02/05/21     hydrALAZINE (APRESOLINE) 25 MG tablet Take 1 tablet (25 mg total) by mouth 3 (three) times daily. 11/27/20   Lavina Hamman, MD  hydrOXYzine (ATARAX/VISTARIL) 25 MG tablet Take 1 tablet (25 mg total) by mouth 3 (three) times daily as needed for itching. 11/27/20   Lavina Hamman, MD  imipramine (TOFRANIL) 25 MG tablet TAKE 1 TABLET (25 MG TOTAL) BY MOUTH AT BEDTIME. FOR DEPRESSION 09/06/20 09/06/21  Gildardo Pounds, NP  insulin detemir (LEVEMIR) 100 UNIT/ML injection Inject 0.15 mLs (15 Units total) into the skin daily. Patient  taking differently: Inject 10 Units into the skin daily. 05/10/20   Amin, Jeanella Flattery, MD  losartan (COZAAR) 50 MG tablet TAKE 1 TABLET (50 MG TOTAL) BY MOUTH DAILY. 08/13/20 08/13/21  Antonieta Pert, MD  multivitamin (RENA-VIT) TABS tablet Take 1 tablet by mouth at bedtime. 11/27/20   Lavina Hamman, MD  Nutritional Supplements (FEEDING SUPPLEMENT, [name] CARB STEADY,) LIQD Take 237 mLs by mouth 3 (three) times daily between meals. 11/27/20   Lavina Hamman, MD  sevelamer carbonate (RENVELA) 800 MG tablet TAKE 1 TABLET (800 MG TOTAL) BY MOUTH THREE TIMES DAILY WITH MEALS. Patient not taking: No sig reported 08/13/20 08/13/21  Antonieta Pert, MD  torsemide (DEMADEX) 20 MG tablet TAKE 2 TABLETS (40 MG) DAILY ON NON HEMODIALYSIS DAYS 08/13/20 08/13/21  Antonieta Pert, MD  TRUEplus Lancets 28G MISC Use as instructed. Check blood glucose level by fingerstick once per day. 09/06/20   Gildardo Pounds, NP  TRUEplus Lancets 28G MISC USE AS INSTRUCTED. CHECK BLOOD GLUCOSE LEVEL BY FINGERSTICK ONCE PER DAY. 09/06/20 09/06/21  Gildardo Pounds, NP   Current Facility-Administered Medications  Medication Dose Route Frequency Provider Last Rate Last Admin   0.9 %  sodium chloride infusion  250 mL Intravenous PRN Wynetta Fines T, MD       acetaminophen (TYLENOL) tablet 650 mg  650 mg Oral Q4H PRN Wynetta Fines T, MD       albuterol (VENTOLIN HFA) 108 (90 Base) MCG/ACT inhaler 2 puff  2 puff Inhalation Once PRN Wynetta Fines T, MD       atorvastatin (LIPITOR) tablet 40 mg  40 mg Oral Daily Wynetta Fines T, MD   40 mg at 02/09/21 0947   butalbital-acetaminophen-caffeine (FIORICET) 50-325-40 MG per tablet 1 tablet  1 tablet Oral Q6H PRN Lequita Halt, MD   1 tablet at 02/09/21 0653   carvedilol (COREG) tablet 12.5 mg  12.5 mg Oral BID WC Wynetta Fines T, MD   12.5 mg at 02/09/21 0947   Chlorhexidine Gluconate Cloth 2 % PADS 6 each  6 each Topical Q0600 Valentina Gu, NP   6 each at 02/09/21 0948   dextrose 50 % solution 0-50 mL  0-50 mL  Intravenous PRN Petrucelli, Samantha R, PA-C       diphenhydrAMINE (BENADRYL) injection 50 mg  50 mg Intravenous Once PRN Wynetta Fines T, MD       doxercalciferol ([name]) injection 5 mcg  5 mcg Intravenous Q M,W,F-HD Valentina Gu, NP       doxycycline (VIBRA-TABS) tablet 100 mg  100 mg Oral Q12H Wynetta Fines T, MD   100 mg at 02/09/21 0947   EPINEPHrine (EPI-PEN) injection 0.3 mg  0.3 mg Intramuscular Once PRN Wynetta Fines T, MD       famotidine (PEPCID) IVPB 20 mg premix  20 mg Intravenous Once PRN Lequita Halt, MD  feeding supplement ([name] CARB STEADY) liquid 237 mL  237 mL Oral TID BM Wynetta Fines T, MD   237 mL at 02/09/21 1052   furosemide (LASIX) injection 40 mg  40 mg Intravenous BID Wynetta Fines T, MD   40 mg at 02/09/21 0948   heparin injection 5,000 Units  5,000 Units Subcutaneous Q12H Lequita Halt, MD   5,000 Units at 02/09/21 7253   hydrALAZINE (APRESOLINE) injection 10 mg  10 mg Intravenous Q6H PRN Irene Pap N, DO       hydrALAZINE (APRESOLINE) tablet 25 mg  25 mg Oral TID Wynetta Fines T, MD   25 mg at 02/09/21 6644   hydrOXYzine (ATARAX/VISTARIL) tablet 25 mg  25 mg Oral TID PRN Lequita Halt, MD   25 mg at 02/08/21 1930   imipramine (TOFRANIL) tablet 25 mg  25 mg Oral QHS Wynetta Fines T, MD       insulin aspart (novoLOG) injection 0-5 Units  0-5 Units Subcutaneous QHS Hall, Carole N, DO       insulin aspart (novoLOG) injection 0-6 Units  0-6 Units Subcutaneous TID WC Irene Pap N, DO   1 Units at 02/09/21 0645   insulin glargine-yfgn (SEMGLEE) injection 5 Units  5 Units Subcutaneous QHS Irene Pap N, DO   5 Units at 02/09/21 0347   methylPREDNISolone sodium succinate (SOLU-MEDROL) 125 mg/2 mL injection 125 mg  125 mg Intravenous Once PRN Wynetta Fines T, MD       multivitamin (RENA-VIT) tablet 1 tablet  1 tablet Oral QHS Wynetta Fines T, MD   1 tablet at 02/09/21 0646   promethazine (PHENERGAN) 25 mg in sodium chloride 0.9 % 50 mL IVPB  25 mg Intravenous Q6H PRN Wynetta Fines T, MD       sevelamer carbonate (RENVELA) tablet 1,600 mg  1,600 mg Oral TID WC Wynetta Fines T, MD   1,600 mg at 02/09/21 0947   sodium chloride flush (NS) 0.9 % injection 3 mL  3 mL Intravenous Q12H Wynetta Fines T, MD   3 mL at 02/09/21 0948   sodium chloride flush (NS) 0.9 % injection 3 mL  3 mL Intravenous PRN Lequita Halt, MD       Labs: Basic Metabolic Panel: Recent Labs  Lab 02/08/21 1905 02/08/21 2317 02/09/21 0256  NA 128* 133* 131*  130*  K 5.0 4.4 4.9  4.9  CL 90* 92* 91*  92*  CO2 _0 GLUCOSE 500* 99 153*  154*  BUN 71* 72* 73*  75*  CREATININE 13.13* 13.06* 13.43*  13.58*  CALCIUM 8.5* 8.7* 8.5*  8.4*  PHOS  --   --  7.1*   Liver Function Tests: Recent Labs  Lab 02/08/21 1351 02/09/21 0256  AST 26 19  ALT 41 36  ALKPHOS 183* 134*  BILITOT 1.2 1.2  PROT 7.5 6.7  ALBUMIN 3.3* 3.1*   No results for input(s): LIPASE, AMYLASE in the last 168 hours. No results for input(s): AMMONIA in the last 168 hours. CBC: Recent Labs  Lab 02/08/21 1351 02/08/21 1401 02/08/21 1641 02/09/21 0256  WBC 6.9  --   --  10.8*  NEUTROABS 6.0  --   --  9.2*  HGB 10.6* 12.6* 11.6* 9.7*  HCT 34.8* 37.0* 34.0* 28.7*  MCV 84.3  --   --  79.7*  PLT 377  --   --  367   Cardiac Enzymes: No results for input(s): CKTOTAL, CKMB, CKMBINDEX, TROPONINI  in the last 168 hours. CBG: Recent Labs  Lab 02/08/21 2038 02/08/21 2126 02/08/21 2305 02/09/21 0050 02/09/21 0634  GLUCAP 304* 229* 113* 125* 179*   Iron Studies:  Recent Labs    02/09/21 0256  FERRITIN 732*   Studies/Results: CT Head Wo Contrast  Result Date: 02/08/2021 CLINICAL DATA:  Nontraumatic seizure. EXAM: CT HEAD WITHOUT CONTRAST TECHNIQUE: Contiguous axial images were obtained from the base of the skull through the vertex without intravenous contrast. COMPARISON:  Nov 26, 2020 FINDINGS: Brain: No evidence of acute infarction, hemorrhage, hydrocephalus, extra-axial collection or mass lesion/mass  effect. Vascular: No hyperdense vessel or unexpected calcification. Skull: Normal. Negative for fracture or focal lesion. Sinuses/Orbits: No acute finding. Other: None. IMPRESSION: No acute intracranial abnormalities are identified. Electronically Signed   By: Dorise Bullion III M.D   On: 02/08/2021 15:09   DG Chest Portable 1 View  Result Date: 02/08/2021 CLINICAL DATA:  Cough,CP. EXAM: PORTABLE CHEST 1 VIEW COMPARISON:  Chest radiograph 11/25/2020 FINDINGS: Interval removal of a right central venous catheter. Stable cardiomediastinal contours with enlarged heart size. Low lung volumes. There is central vascular congestion and slight diffuse bilateral interstitial thickening. No new focal consolidation. No pneumothorax or large pleural effusion. No acute finding in the visualized skeleton. IMPRESSION: Findings suggestive of mild interstitial edema. No new focal consolidation. Electronically Signed   By: Audie Pinto M.D.   On: 02/08/2021 14:08    ROS: As per HPI otherwise negative.   Physical Exam: Vitals:   02/08/21 2203 02/09/21 0034 02/09/21 0500 02/09/21 0824  BP: (!) 147/91 (!) 151/97  (!) 148/101  Pulse: 93 91  91  Resp: 15 20    Temp: 98.2 F (36.8 C) 98.1 F (36.7 C)  98.1 F (36.7 C)  TempSrc: Axillary Axillary  Oral  SpO2: 98% 99%  99%  Weight:   93.4 kg   Height:         General: disheveled, c/o worsening vision- in no acute distress. Head: Normocephalic, atraumatic, sclera non-icteric, mucus membranes are moist Neck: Supple. JVD not elevated. Lungs: Clear bilaterally to auscultation without wheezes, rales, or rhonchi. Breathing is unlabored. Heart: RRR with S1 S2. No murmurs, rubs, or gallops appreciated. Abdomen: Soft, non-tender, non-distended with normoactive bowel sounds. No rebound/guarding. No obvious abdominal masses. M-S:  Strength and tone appear normal for age. Lower extremities: some chronic edema - no ischemic changes, no open wounds  Neuro: Alert and  oriented X 3. Moves all extremities spontaneously. Psych:  Responds to questions appropriately with a normal affect. Dialysis Access:  left AVF-  good thrill and bruit  Dialysis Orders: [facility] MWF 4 hrs 180NRe 450/800 86.5 kg 2.0 K/2.0 Ca L AVF -Heparin-none -[name] 5 mcg IV TIW -Venofer 100 mcg IV X 10 doses 3/7 doses given last dose 02/04/21 -Mircera 225 mcg IV q 2 weeks (last dose 02/02/2021)   Assessment/Plan:  DKA VS HHS: Started on insulin gtt per primary with improvement in BS and K+. Per primary AMS: Thought to be related to elevated glucose +/- seizure. CT of head without acute changes. Per primary.  Vision is pretty much gone but pt admits that it wasn't great prior- ? Small CVA COVID 6- Per primary-  on minimal O2 Hypertensive Emergency: Medications and EDW adjusted at OP HD center last week. Still elevated but improved with meds. HD today-continue to lower volume as tolerated. Change daily furosemide back to Torsemide 20 mg 2 tabs on non HD days.  Hyperkalemia-resolved with correction of BS.  R toe ulcer: On Doxycycline, WC consult per primary  ESRD -  MWF-missed HD 07/29 D/T transportation issues. No heparin. 2.0 K bath.  Planning on HD today   Hypertension/volume  - As noted above. UF as tolerated today.   Anemia  - HGB 9.7. Recent OP max ESA dose. Continue Fe load. Consulted with pharmacy-no issues with receiving Fe while on oral doxy.   Metabolic bone disease -  C Ca OK, PO4 elevated. Increase Renvela to 2 tabs PO TID AC. Continue VDRA.   Nutrition -Albumin 3.1. Renal Carb mod diet, add nepro, renal vits.   Rita H. Owens Shark, NP-C 02/09/2021, 10:59 AM  [employer] [phone]  Patient seen and examined, agree with above note with above modifications. ESRD pt well known to me-  I saw him on Wednesday 7/27-- BP was high-  BP meds were adjusted up-  he presented with dec MS-  insulin had been stopped-  his sugar was over 700 and he appeared post ictal-   SZ vs CVA as vision now appears to be gone but no other focal neurologic sxms-  Plan for HD today and appropriate titration of dialysis related meds-  UF as able-  interesting that BP is much better after not that aggressive BP reg has been added  Corliss Parish, MD 02/09/2021

## 2021-02-09 NOTE — ED Notes (Signed)
Bed placement called about pt bed. PT off drips and BP controlled

## 2021-02-09 NOTE — Procedures (Addendum)
Patient Name: [name]  MRN: [id number]  Epilepsy Attending: Lora Havens  Referring Physician/Provider: Dr Wynetta Fines Date: 02/09/2021 Duration: 21.41 mins  Patient history: 35 year old male presented with syncope, seizure-like activity and altered mental status.  EEG to evaluate for seizures.  Level of alertness: Awake, asleep  AEDs during EEG study: None  Technical aspects: This EEG study was done with scalp electrodes positioned according to the 10-20 International system of electrode placement. Electrical activity was acquired at a sampling rate of '500Hz'$  and reviewed with a high frequency filter of '70Hz'$  and a low frequency filter of '1Hz'$ . EEG data were recorded continuously and digitally stored.   Description: The posterior dominant rhythm consists of 8 Hz activity of moderate voltage (25-35 uV) seen predominantly in posterior head regions, symmetric and reactive to eye opening and eye closing. Sleep was characterized by sleep spindles (12 to 14 Hz), maximal frontocentral region. EEG showed continuous generalized and maximal left temporal 3 to 6 Hz theta-delta slowing. Hyperventilation and photic stimulation were not performed.     ABNORMALITY - Continuous slow, generalized and maximal left temporal region  IMPRESSION: This study is suggestive of cortical dysfunction in left temporal region which could be secondary to underlying structural abnormality, postictal state.  There is also mild diffuse encephalopathy, nonspecific etiology.  No seizures or epileptiform discharges were seen throughout the recording.  Clark Clowdus Barbra Sarks

## 2021-02-09 NOTE — Progress Notes (Signed)
Pts has personal belongings with medicine bottles, pt to send home with family today. They are in pts closet in room.

## 2021-02-09 NOTE — Progress Notes (Signed)
  Echocardiogram 2D Echocardiogram has been performed.  [name] 02/09/2021, 10:49 AM

## 2021-02-09 NOTE — Progress Notes (Signed)
EEG complete - results pending 

## 2021-02-09 NOTE — Plan of Care (Signed)
  Problem: Education: Goal: Knowledge of General Education information will improve Description: Including pain rating scale, medication(s)/side effects and non-pharmacologic comfort measures Outcome: Progressing   Problem: Health Behavior/Discharge Planning: Goal: Ability to manage health-related needs will improve Outcome: Progressing   Problem: Clinical Measurements: Goal: Ability to maintain clinical measurements within normal limits will improve Outcome: Progressing Goal: Will remain free from infection Outcome: Progressing Goal: Diagnostic test results will improve Outcome: Progressing Goal: Respiratory complications will improve Outcome: Progressing Goal: Cardiovascular complication will be avoided Outcome: Progressing   Problem: Coping: Goal: Level of anxiety will decrease Outcome: Progressing   Problem: Pain Managment: Goal: General experience of comfort will improve Outcome: Progressing   Problem: Safety: Goal: Ability to remain free from injury will improve Outcome: Progressing   Problem: Education: Goal: Expressions of having a comfortable level of knowledge regarding the disease process will increase Outcome: Progressing   Problem: Coping: Goal: Ability to adjust to condition or change in health will improve Outcome: Progressing Goal: Ability to identify appropriate support needs will improve Outcome: Progressing   Problem: Medication: Goal: Risk for medication side effects will decrease Outcome: Progressing   Problem: Clinical Measurements: Goal: Complications related to the disease process, condition or treatment will be avoided or minimized Outcome: Progressing Goal: Diagnostic test results will improve Outcome: Progressing   Problem: Self-Concept: Goal: Level of anxiety will decrease Outcome: Progressing Goal: Ability to verbalize feelings about condition will improve Outcome: Progressing

## 2021-02-09 NOTE — Plan of Care (Signed)
ON admission pt was drowsy but oriented x 4. However, some questions were half answered. This morning pt ambulated with nursing assistant to bathroom with +1 assist. Pt is Alert oriented x 4, This morning. Pt answering question completely. Pt states he doesn't know what happened. He also states he can not see out of both eyes. Pt Also states this occurred after his seizure.   Problem: Education: Goal: Knowledge of General Education information will improve Description: Including pain rating scale, medication(s)/side effects and non-pharmacologic comfort measures Outcome: Progressing   Problem: Health Behavior/Discharge Planning: Goal: Ability to manage health-related needs will improve Outcome: Progressing   Problem: Clinical Measurements: Goal: Ability to maintain clinical measurements within normal limits will improve Outcome: Progressing Goal: Will remain free from infection Outcome: Progressing Goal: Diagnostic test results will improve Outcome: Progressing Goal: Respiratory complications will improve Outcome: Progressing Goal: Cardiovascular complication will be avoided Outcome: Progressing   Problem: Activity: Goal: Risk for activity intolerance will decrease Outcome: Progressing   Problem: Nutrition: Goal: Adequate nutrition will be maintained Outcome: Progressing   Problem: Coping: Goal: Level of anxiety will decrease Outcome: Progressing   Problem: Elimination: Goal: Will not experience complications related to bowel motility Outcome: Progressing Goal: Will not experience complications related to urinary retention Outcome: Progressing   Problem: Pain Managment: Goal: General experience of comfort will improve Outcome: Progressing   Problem: Safety: Goal: Ability to remain free from injury will improve Outcome: Progressing   Problem: Skin Integrity: Goal: Risk for impaired skin integrity will decrease Outcome: Progressing   Problem: Education: Goal:  Expressions of having a comfortable level of knowledge regarding the disease process will increase Outcome: Progressing   Problem: Coping: Goal: Ability to adjust to condition or change in health will improve Outcome: Progressing Goal: Ability to identify appropriate support needs will improve Outcome: Progressing   Problem: Health Behavior/Discharge Planning: Goal: Compliance with prescribed medication regimen will improve Outcome: Progressing   Problem: Medication: Goal: Risk for medication side effects will decrease Outcome: Progressing   Problem: Clinical Measurements: Goal: Complications related to the disease process, condition or treatment will be avoided or minimized Outcome: Progressing Goal: Diagnostic test results will improve Outcome: Progressing   Problem: Safety: Goal: Verbalization of understanding the information provided will improve Outcome: Progressing   Problem: Self-Concept: Goal: Level of anxiety will decrease Outcome: Progressing Goal: Ability to verbalize feelings about condition will improve Outcome: Progressing

## 2021-02-10 DIAGNOSIS — R569 Unspecified convulsions: Secondary | ICD-10-CM | POA: Diagnosis not present

## 2021-02-10 DIAGNOSIS — E1110 Type 2 diabetes mellitus with ketoacidosis without coma: Secondary | ICD-10-CM

## 2021-02-10 DIAGNOSIS — N186 End stage renal disease: Secondary | ICD-10-CM | POA: Diagnosis not present

## 2021-02-10 DIAGNOSIS — R739 Hyperglycemia, unspecified: Secondary | ICD-10-CM | POA: Diagnosis not present

## 2021-02-10 LAB — COMPREHENSIVE METABOLIC PANEL
ALT: 25 U/L (ref 0–44)
AST: 13 U/L — ABNORMAL LOW (ref 15–41)
Albumin: 2.8 g/dL — ABNORMAL LOW (ref 3.5–5.0)
Alkaline Phosphatase: 127 U/L — ABNORMAL HIGH (ref 38–126)
Anion gap: 18 — ABNORMAL HIGH (ref 5–15)
BUN: 90 mg/dL — ABNORMAL HIGH (ref 6–20)
CO2: 22 mmol/L (ref 22–32)
Calcium: 8 mg/dL — ABNORMAL LOW (ref 8.9–10.3)
Chloride: 90 mmol/L — ABNORMAL LOW (ref 98–111)
Creatinine, Ser: 15.01 mg/dL — ABNORMAL HIGH (ref 0.61–1.24)
GFR, Estimated: 4 mL/min — ABNORMAL LOW (ref >60)
Glucose, Bld: 204 mg/dL — ABNORMAL HIGH (ref 70–99)
Potassium: 4.8 mmol/L (ref 3.5–5.1)
Sodium: 130 mmol/L — ABNORMAL LOW (ref 135–145)
Total Bilirubin: 1 mg/dL (ref 0.3–1.2)
Total Protein: 6.2 g/dL — ABNORMAL LOW (ref 6.5–8.1)

## 2021-02-10 LAB — CBC WITH DIFFERENTIAL/PLATELET
Abs Immature Granulocytes: 0.02 10*3/uL (ref 0.00–0.07)
Basophils Absolute: 0 10*3/uL (ref 0.0–0.1)
Basophils Relative: 0 %
Eosinophils Absolute: 0.1 10*3/uL (ref 0.0–0.5)
Eosinophils Relative: 2 %
HCT: 27.9 % — ABNORMAL LOW (ref 39.0–52.0)
Hemoglobin: 9.1 g/dL — ABNORMAL LOW (ref 13.0–17.0)
Immature Granulocytes: 0 %
Lymphocytes Relative: 14 %
Lymphs Abs: 1.1 10*3/uL (ref 0.7–4.0)
MCH: 26.5 pg (ref 26.0–34.0)
MCHC: 32.6 g/dL (ref 30.0–36.0)
MCV: 81.1 fL (ref 80.0–100.0)
Monocytes Absolute: 0.5 10*3/uL (ref 0.1–1.0)
Monocytes Relative: 7 %
Neutro Abs: 6.1 10*3/uL (ref 1.7–7.7)
Neutrophils Relative %: 77 %
Platelets: 356 10*3/uL (ref 150–400)
RBC: 3.44 MIL/uL — ABNORMAL LOW (ref 4.22–5.81)
RDW: 18.6 % — ABNORMAL HIGH (ref 11.5–15.5)
WBC: 7.8 10*3/uL (ref 4.0–10.5)
nRBC: 0 % (ref 0.0–0.2)

## 2021-02-10 LAB — FERRITIN: Ferritin: 554 ng/mL — ABNORMAL HIGH (ref 24–336)

## 2021-02-10 LAB — MAGNESIUM: Magnesium: 2.5 mg/dL — ABNORMAL HIGH (ref 1.7–2.4)

## 2021-02-10 LAB — GLUCOSE, CAPILLARY
Glucose-Capillary: 197 mg/dL — ABNORMAL HIGH (ref 70–99)
Glucose-Capillary: 192 mg/dL — ABNORMAL HIGH (ref 70–99)
Glucose-Capillary: 189 mg/dL — ABNORMAL HIGH (ref 70–99)
Glucose-Capillary: 212 mg/dL — ABNORMAL HIGH (ref 70–99)

## 2021-02-10 LAB — PHOSPHORUS: Phosphorus: 7.5 mg/dL — ABNORMAL HIGH (ref 2.5–4.6)

## 2021-02-10 LAB — D-DIMER, QUANTITATIVE: D-Dimer, Quant: 0.97 ug/mL-FEU — ABNORMAL HIGH (ref 0.00–0.50)

## 2021-02-10 LAB — C-REACTIVE PROTEIN: CRP: 0.9 mg/dL (ref <1.0)

## 2021-02-10 MED ORDER — LIDOCAINE-PRILOCAINE 2.5-2.5 % EX CREA: 1.0000 "application " | TOPICAL_CREAM | CUTANEOUS | Status: DC | PRN

## 2021-02-10 MED ORDER — SODIUM CHLORIDE 0.9 % IV SOLN: 100.0000 mL | INTRAVENOUS | Status: DC | PRN

## 2021-02-10 MED ORDER — LOSARTAN POTASSIUM 50 MG PO TABS
50.0000 mg | ORAL_TABLET | Freq: Every day | ORAL | Status: DC
  Administered 2021-02-10 – 2021-03-04 (×23): 50 mg via ORAL
  Filled 2021-02-10 (×23): qty 1

## 2021-02-10 MED ORDER — PENTAFLUOROPROP-TETRAFLUOROETH EX AERO: 1.0000 "application " | INHALATION_SPRAY | CUTANEOUS | Status: DC | PRN

## 2021-02-10 MED ORDER — LIDOCAINE HCL (PF) 1 % IJ SOLN: 5.0000 mL | INTRAMUSCULAR | Status: DC | PRN

## 2021-02-10 NOTE — Progress Notes (Signed)
Pt transported off unit to dialysis for treatment. Francis Gaines Kayleeann Huxford RN.

## 2021-02-10 NOTE — Progress Notes (Addendum)
Progress Note    [name]  [id number] DOB: 07-15-1985  DOA: 02/08/2021 PCP: Gildardo Pounds, NP    Brief Narrative:     Medical records reviewed and are as summarized below:  [name] is an 35 y.o. male with medical history significant of ESRD on HD, IDDM off insulin, HTN, HLD, legally blind, presented with syncope versus seizure. Appears to have had a seizure from high blood sugars.  Issue for d/c now is aunt will not take back home.  TOC consulted.   Assessment/Plan:   Active Problems:   Acute hypoxemic respiratory failure (HCC)   Syncope   DKA (diabetic ketoacidosis) ([place])   AMS due to seizures -? Multifactorial: elevated sugar plus seizure -EEG: ?  dysfunction on left temporal region--- MRI negative so most likely seizure from high blood sugars- no need for seizure meds -echo done when there was ? Of syncope: EF at 45%- outpatient follow up  HHS  -type 2 DM per chart -transitioned to SQ insulin -diabetic coordinator consult appreciated   HTN  -resume home meds  Acute on chronic diastolic CHF decompensation -volume controlled in HD.   COVID-19 infection- incidental -No symptoms or signs of COVID-pneumonia, chest x-ray showed signs more compatible with fluid overload/diastolic CHF decompensation. -Given significant comorbidities at baseline, ER decided to give monoclonal antibody infusion x1 by admitter   Hyperkalemia -resolved   Right toes ulcer -continue doxycycline as per podiatry -no fever, no WBC count   ESRD on HD -nephrology consult    Family Communication/Anticipated D/C date and plan/Code Status   Code Status: Full Code.  Disposition Plan: Status is: Inpatient Called aunt, no answer and VM full  Remains inpatient appropriate because:Inpatient level of care appropriate due to severity of illness  Dispo: The patient is from: Home              Anticipated d/c is to: tbd              Patient currently is not medically  stable to d/c.   Difficult to place patient No         Medical Consultants:   Renal neurology   Subjective:   In HD  Objective:    Vitals:   02/10/21 1115 02/10/21 1130 02/10/21 1145 02/10/21 1200  BP: (!) 145/92 (!) 151/86 (!) 150/87 (!) 154/90  Pulse: 79 77 77 79  Resp: (!) 0 (!) '24 18 14  '$ Temp:    98.2 F (36.8 C)  TempSrc:      SpO2: 96% 96% 97% 95%  Weight:    87.8 kg  Height:        Intake/Output Summary (Last 24 hours) at 02/10/2021 1406 Last data filed at 02/10/2021 1145 Gross per 24 hour  Intake 33.67 ml  Output 4000 ml  Net -3966.33 ml   Filed Weights   02/10/21 0416 02/10/21 0715 02/10/21 1200  Weight: 95.9 kg 91.8 kg 87.8 kg    Exam:   General: Appearance:     Overweight male in no acute distress     Lungs:     respirations unlabored  Heart:    Normal heart rate.        Neurologic:   Disengaged, flat affect      Data Reviewed:   I have personally reviewed following labs and imaging studies:  Labs: Labs show the following:   Basic Metabolic Panel: Recent Labs  Lab 02/08/21 1630 02/08/21 1641 02/08/21 1905 02/08/21 2317 02/09/21 0256  02/10/21 0357  NA 124* 124* 128* 133* 131*  130* 130*  K 6.2* 6.0* 5.0 4.4 4.9  4.9 4.8  CL 87*  --  90* 92* 91*  92* 90*  CO2 20*  --  '22 22 23  22 22  '$ GLUCOSE 783*  --  500* 99 153*  154* 204*  BUN 71*  --  71* 72* 73*  75* 90*  CREATININE 13.19*  --  13.13* 13.06* 13.43*  13.58* 15.01*  CALCIUM 8.2*  --  8.5* 8.7* 8.5*  8.4* 8.0*  MG  --   --   --   --  2.4 2.5*  PHOS  --   --   --   --  7.1* 7.5*   GFR Estimated Creatinine Clearance: 7.5 mL/min (A) (by C-G formula based on SCr of 15.01 mg/dL (H)). Liver Function Tests: Recent Labs  Lab 02/08/21 1351 02/09/21 0256 02/10/21 0357  AST 26 19 13*  ALT 41 36 25  ALKPHOS 183* 134* 127*  BILITOT 1.2 1.2 1.0  PROT 7.5 6.7 6.2*  ALBUMIN 3.3* 3.1* 2.8*   No results for input(s): LIPASE, AMYLASE in the last 168 hours. No results  for input(s): AMMONIA in the last 168 hours. Coagulation profile No results for input(s): INR, PROTIME in the last 168 hours.  CBC: Recent Labs  Lab 02/08/21 1351 02/08/21 1401 02/08/21 1641 02/09/21 0256 02/10/21 0357  WBC 6.9  --   --  10.8* 7.8  NEUTROABS 6.0  --   --  9.2* 6.1  HGB 10.6* 12.6* 11.6* 9.7* 9.1*  HCT 34.8* 37.0* 34.0* 28.7* 27.9*  MCV 84.3  --   --  79.7* 81.1  PLT 377  --   --  367 356   Cardiac Enzymes: No results for input(s): CKTOTAL, CKMB, CKMBINDEX, TROPONINI in the last 168 hours. BNP (last 3 results) No results for input(s): PROBNP in the last 8760 hours. CBG: Recent Labs  Lab 02/09/21 1255 02/09/21 1600 02/09/21 2104 02/10/21 0413 02/10/21 1241  GLUCAP 225* 300* 298* 197* 192*   D-Dimer: Recent Labs    02/09/21 0256 02/10/21 0357  DDIMER 1.31* 0.97*   Hgb A1c: Recent Labs    02/08/21 1905  HGBA1C 12.9*   Lipid Profile: No results for input(s): CHOL, HDL, LDLCALC, TRIG, CHOLHDL, LDLDIRECT in the last 72 hours. Thyroid function studies: No results for input(s): TSH, T4TOTAL, T3FREE, THYROIDAB in the last 72 hours.  Invalid input(s): FREET3 Anemia work up: Recent Labs    02/09/21 0256 02/10/21 0357  FERRITIN 732* 554*   Sepsis Labs: Recent Labs  Lab 02/08/21 1351 02/08/21 1425 02/08/21 1630 02/08/21 1905 02/09/21 0256 02/10/21 0357  PROCALCITON  --   --   --  1.10  --   --   WBC 6.9  --   --   --  10.8* 7.8  LATICACIDVEN  --  3.9* 2.6*  --   --   --     Microbiology Recent Results (from the past 240 hour(s))  Resp Panel by RT-PCR (Flu A&B, Covid) Nasopharyngeal Swab     Status: Abnormal   Collection Time: 02/08/21  2:08 PM   Specimen: Nasopharyngeal Swab; Nasopharyngeal(NP) swabs in vial transport medium  Result Value Ref Range Status   SARS Coronavirus 2 by RT PCR POSITIVE (A) NEGATIVE Final    Comment: RESULT CALLED TO, READ BACK BY AND VERIFIED WITH: B,WRIGHT '@1613'$  02/08/21 EB (NOTE) SARS-CoV-2 target nucleic  acids are DETECTED.  The SARS-CoV-2 RNA is generally  detectable in upper respiratory specimens during the acute phase of infection. Positive results are indicative of the presence of the identified virus, but do not rule out bacterial infection or co-infection with other pathogens not detected by the test. Clinical correlation with patient history and other diagnostic information is necessary to determine patient infection status. The expected result is Negative.  Fact Sheet for Patients: EntrepreneurPulse.com.au  Fact Sheet for Healthcare Providers: IncredibleEmployment.be  This test is not yet approved or cleared by the [place] FDA and  has been authorized for detection and/or diagnosis of SARS-CoV-2 by FDA under an Emergency Use Authorization (EUA).  This EUA will remain in effect (meaning this test can be used) f or the duration of  the COVID-19 declaration under Section 564(b)(1) of the Act, 21 U.S.C. section 360bbb-3(b)(1), unless the authorization is terminated or revoked sooner.     Influenza A by PCR NEGATIVE NEGATIVE Final   Influenza B by PCR NEGATIVE NEGATIVE Final    Comment: (NOTE) The Xpert Xpress SARS-CoV-2/FLU/RSV plus assay is intended as an aid in the diagnosis of influenza from Nasopharyngeal swab specimens and should not be used as a sole basis for treatment. Nasal washings and aspirates are unacceptable for Xpert Xpress SARS-CoV-2/FLU/RSV testing.  Fact Sheet for Patients: EntrepreneurPulse.com.au  Fact Sheet for Healthcare Providers: IncredibleEmployment.be  This test is not yet approved or cleared by the [place] FDA and has been authorized for detection and/or diagnosis of SARS-CoV-2 by FDA under an Emergency Use Authorization (EUA). This EUA will remain in effect (meaning this test can be used) for the duration of the COVID-19 declaration under Section 564(b)(1) of  the Act, 21 U.S.C. section 360bbb-3(b)(1), unless the authorization is terminated or revoked.  Performed at [facility], [place] [address] [place], [place] [address]     Procedures and diagnostic studies:  CT Head Wo Contrast  Result Date: 02/08/2021 CLINICAL DATA:  Nontraumatic seizure. EXAM: CT HEAD WITHOUT CONTRAST TECHNIQUE: Contiguous axial images were obtained from the base of the skull through the vertex without intravenous contrast. COMPARISON:  Nov 26, 2020 FINDINGS: Brain: No evidence of acute infarction, hemorrhage, hydrocephalus, extra-axial collection or mass lesion/mass effect. Vascular: No hyperdense vessel or unexpected calcification. Skull: Normal. Negative for fracture or focal lesion. Sinuses/Orbits: No acute finding. Other: None. IMPRESSION: No acute intracranial abnormalities are identified. Electronically Signed   By: Dorise Bullion III M.D   On: 02/08/2021 15:09   MR BRAIN WO CONTRAST  Result Date: 02/10/2021 CLINICAL DATA:  Seizure EXAM: MRI HEAD WITHOUT CONTRAST TECHNIQUE: Multiplanar, multiecho pulse sequences of the brain and surrounding structures were obtained without intravenous contrast. COMPARISON:  None. FINDINGS: Brain: No acute infarct, mass effect or extra-axial collection. No acute or chronic hemorrhage. There is multifocal hyperintense T2-weighted signal within the [place] matter. Parenchymal volume and CSF spaces are normal. The midline structures are normal. Vascular: Major flow voids are preserved. Skull and upper cervical spine: Normal calvarium and skull base. Visualized upper cervical spine and soft tissues are normal. Sinuses/Orbits:No paranasal sinus fluid levels or advanced mucosal thickening. No mastoid or middle ear effusion. No acute orbital abnormality. 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 No acute intracranial abnormality. 2. Multifocal hyperintense T2-weighted signal within the [place] matter, nonspecific but most  commonly indicating chronic small vessel disease. Electronically Signed   By: Ulyses Jarred M.D.   On: 02/10/2021 00:50   EEG adult  Result Date: 02/09/2021 Lora Havens, MD     02/09/2021 12:58 PM Patient  Name: [name] MRN: [id number] Epilepsy Attending: Lora Havens Referring Physician/Provider: Dr Wynetta Fines Date: 02/09/2021 Duration: 21.41 mins Patient history: 35 year old male presented with syncope, seizure-like activity and altered mental status.  EEG to evaluate for seizures. Level of alertness: Awake, asleep AEDs during EEG study: None Technical aspects: This EEG study was done with scalp electrodes positioned according to the 10-20 International system of electrode placement. Electrical activity was acquired at a sampling rate of '500Hz'$  and reviewed with a high frequency filter of '70Hz'$  and a low frequency filter of '1Hz'$ . EEG data were recorded continuously and digitally stored. Description: The posterior dominant rhythm consists of 8 Hz activity of moderate voltage (25-35 uV) seen predominantly in posterior head regions, symmetric and reactive to eye opening and eye closing. Sleep was characterized by sleep spindles (12 to 14 Hz), maximal frontocentral region. EEG showed continuous generalized and maximal left temporal 3 to 6 Hz theta-delta slowing. Hyperventilation and photic stimulation were not performed.   ABNORMALITY - Continuous slow, generalized and maximal left temporal region IMPRESSION: This study is suggestive of cortical dysfunction in left temporal region which could be secondary to underlying structural abnormality, postictal state.  There is also mild diffuse encephalopathy, nonspecific etiology.  No seizures or epileptiform discharges were seen throughout the recording. Lora Havens   ECHOCARDIOGRAM LIMITED  Result Date: 02/09/2021    ECHOCARDIOGRAM LIMITED REPORT   Patient Name:   [name] Date of Exam: 02/09/2021 Medical Rec #:  [id number]       Height:       72.0  in Accession #:    [id number]      Weight:       205.9 lb Date of Birth:  [birth year]       BSA:          2.157 m Patient Age:    23 years        BP:           148/101 mmHg Patient Gender: M               HR:           85 bpm. Exam Location:  Inpatient Procedure: Limited Echo, 3D Echo, Cardiac Doppler, Color Doppler and Strain            Analysis Indications:    R55 Syncope  History:        Patient has prior history of Echocardiogram examinations, most                 recent 12/04/2019. Signs/Symptoms:Syncope, Altered Mental Status                 and Dyspnea; Risk Factors:Diabetes and Hypertension. Covid                 positive. ESRD. Hypoxia.  Sonographer:    Roseanna Rainbow RDCS Referring Phys: [id number] Lequita Halt  Sonographer Comments: Global longitudinal strain was attempted. IMPRESSIONS  1. Left ventricular ejection fraction, by estimation, is 45 to 50%. The left ventricle has mildly decreased function. The left ventricle demonstrates global hypokinesis. There is severe left ventricular hypertrophy. Left ventricular diastolic parameters  are indeterminate.  2. Right ventricular systolic function is normal. The right ventricular size is mildly enlarged. Moderately increased right ventricular wall thickness. There is moderately elevated pulmonary artery systolic pressure. The estimated right ventricular systolic pressure is [id number] mmHg.  3. The mitral valve is normal in structure. Trivial mitral valve regurgitation.  4. Tricuspid valve regurgitation is moderate.  5. The aortic valve is tricuspid. Aortic valve regurgitation is not visualized.  6. The inferior vena cava is dilated in size with <50% respiratory variability, suggesting right atrial pressure of 15 mmHg. FINDINGS  Left Ventricle: Left ventricular ejection fraction, by estimation, is 45 to 50%. The left ventricle has mildly decreased function. The left ventricle demonstrates global hypokinesis. The left ventricular internal cavity size was normal in size.  There is  severe left ventricular hypertrophy. Left ventricular diastolic parameters are indeterminate. Right Ventricle: The right ventricular size is mildly enlarged. Moderately increased right ventricular wall thickness. Right ventricular systolic function is normal. There is moderately elevated pulmonary artery systolic pressure. The tricuspid regurgitant velocity is 3.05 m/s, and with an assumed right atrial pressure of 15 mmHg, the estimated right ventricular systolic pressure is [id number] mmHg. Pericardium: Trivial pericardial effusion is present. Mitral Valve: The mitral valve is normal in structure. Trivial mitral valve regurgitation. Tricuspid Valve: The tricuspid valve is normal in structure. Tricuspid valve regurgitation is moderate. Aortic Valve: The aortic valve is tricuspid. Aortic valve regurgitation is not visualized. Aorta: The aortic root is normal in size and structure. Venous: The inferior vena cava is dilated in size with less than 50% respiratory variability, suggesting right atrial pressure of 15 mmHg. LEFT VENTRICLE PLAX 2D LVIDd:         4.50 cm LVIDs:         3.70 cm LV PW:         2.30 cm LV IVS:        2.30 cm LVOT diam:     2.50 cm LVOT Area:     4.91 cm  LV Volumes (MOD) LV vol d, MOD A2C: 185.0 ml LV vol d, MOD A4C: 164.0 ml LV vol s, MOD A2C: 91.6 ml LV vol s, MOD A4C: 88.6 ml LV SV MOD A2C:     93.4 ml LV SV MOD A4C:     164.0 ml LV SV MOD BP:      87.3 ml IVC IVC diam: 2.90 cm LEFT ATRIUM         Index LA diam:    4.10 cm 1.90 cm/m   AORTA Ao Root diam: 3.90 cm Ao Asc diam:  3.30 cm TRICUSPID VALVE TR Peak grad:   37.2 mmHg TR Vmax:        305.00 cm/s  SHUNTS Systemic Diam: 2.50 cm Oswaldo Milian MD Electronically signed by Oswaldo Milian MD Signature Date/Time: 02/09/2021/1:41:58 PM    Final     Medications:    atorvastatin  40 mg Oral Daily   carvedilol  12.5 mg Oral BID WC   Chlorhexidine Gluconate Cloth  6 each Topical Q0600   doxercalciferol  5 mcg Intravenous Q  M,W,F-HD   doxycycline  100 mg Oral Q12H   feeding supplement ([name] CARB STEADY)  237 mL Oral TID BM   heparin  5,000 Units Subcutaneous Q12H   imipramine  25 mg Oral QHS   insulin aspart  0-5 Units Subcutaneous QHS   insulin aspart  0-6 Units Subcutaneous TID WC   insulin glargine-yfgn  5 Units Subcutaneous QHS   losartan  50 mg Oral QHS   multivitamin  1 tablet Oral QHS   sevelamer carbonate  1,600 mg Oral TID WC   sodium chloride flush  3 mL Intravenous Q12H   torsemide  40 mg Oral Q T,Th,S,Su   Continuous Infusions:  sodium chloride     promethazine (PHENERGAN)  injection (IM or IVPB)       LOS: 2 days   Geradine Girt  [facility]   How to contact the [facility] Attending or Consulting provider [place] or covering provider during after hours [place], for this patient?  Check the care team in [facility] and look for a) attending/consulting [facility] provider listed and b) the [facility] team listed Log into [email or web address] and use [place]'s universal password to access. If you do not have the password, please contact the hospital operator. Locate the [facility] provider you are looking for under [facility] and page to a number that you can be directly reached. If you still have difficulty reaching the provider, please page the [facility] (Director on Call) for the Hospitalists listed on amion for assistance.  02/10/2021, 2:06 PM

## 2021-02-10 NOTE — Procedures (Signed)
Patient was seen on dialysis and the procedure was supervised.  BFR 400  Via AVF BP is  145/100.   Patient appears to be tolerating treatment well-  challenging goal   Louis Meckel 02/10/2021

## 2021-02-10 NOTE — Progress Notes (Signed)
Subjective:    Seen on HD- No new issues-  HD got bumped to today for dialysis staffing concerns-  neuro consult reviewed-  gettig MRI-  did not feel needed sz meds -  sugar better-  he is hungry asking about breakfast-  vision is a little better   Objective Vital signs in last 24 hours: Vitals:   02/09/21 2058 02/10/21 0005 02/10/21 0416 02/10/21 0417  BP: 126/85 130/88  (!) 145/100  Pulse: 69 74  73  Resp:  17  19  Temp: 98.4 F (36.9 C) 98.4 F (36.9 C)  98.8 F (37.1 C)  TempSrc: Oral Oral  Oral  SpO2: 93% 92%  100%  Weight:   95.9 kg   Height:       Weight change: 46 kg  Intake/Output Summary (Last 24 hours) at 02/10/2021 0830 Last data filed at 02/10/2021 0600 Gross per 24 hour  Intake 33.67 ml  Output --  Net 33.67 ml    Dialysis Orders: [facility] MWF 4 hrs 180NRe 450/800 86.5 kg 2.0 K/2.0 Ca L AVF -Heparin-none -[name] 5 mcg IV TIW -Venofer 100 mcg IV X 10 doses 3/7 doses given last dose 02/04/21 -Mircera 225 mcg IV q 2 weeks (last dose 02/02/2021)    Assessment/ Plan: Pt is a 35 y.o. yo male with ESRD who was admitted on 02/08/2021 with hyperglycemia and questionable sz    Assessment/Plan:  DKA VS HHS: Started on insulin  per primary with improvement in BS and K+. Per primary AMS: Thought to be related to elevated glucose +/- seizure. CT of head without acute changes. Per primary.  Vision is pretty much gone but pt admits that it wasn't great prior, now better ? Small CVA-  neuro involved-  for MRI COVID 19- Per primary-  on minimal O2, no medical treatment required Hypertensive Emergency: Medications and EDW adjusted at OP HD center last week. improved. HD today-continue to lower volume as tolerated. Coreg 12.5 BID-  hydralazine 25 tid which he will never be able to keep up with -  will change to cozaar 50 Torsemide 20 mg 2 tabs on non HD days.  BP is pretty good here-  suspect noncom with meds as OP  Hyperkalemia-resolved with correction of BS. R toe ulcer: On  Doxycycline, WC consult per primary  ESRD -  MWF-missed HD 07/29 D/T transportation issues. No heparin. 2.0 K bath.  Treatment got postponed yest due to HD staffing issues-  will run today and likely tts this week off schedule   Hypertension/volume  - As noted above. UF as tolerated today. seems quite a bit over EDW-  challenging today   Anemia  - HGB 9.7--9.1. Recent OP max ESA dose. Continue Fe load. Consulted with pharmacy-no issues with receiving Fe while on oral doxy.   Metabolic bone disease -  C Ca OK, PO4 elevated. Increase Renvela to 2 tabs PO TID AC. Continue VDRA.   Nutrition -Albumin 3.1. Renal Carb mod diet, add nepro, renal vits.   Louis Meckel    Labs: Basic Metabolic Panel: Recent Labs  Lab 02/08/21 2317 02/09/21 0256 02/10/21 0357  NA 133* 131*  130* 130*  K 4.4 4.9  4.9 4.8  CL 92* 91*  92* 90*  CO2 '22 23  22 22  '$ GLUCOSE 99 153*  154* 204*  BUN 72* 73*  75* 90*  CREATININE 13.06* 13.43*  13.58* 15.01*  CALCIUM 8.7* 8.5*  8.4* 8.0*  PHOS  --  7.1* 7.5*  Liver Function Tests: Recent Labs  Lab 02/08/21 1351 02/09/21 0256 02/10/21 0357  AST 26 19 13*  ALT 41 36 25  ALKPHOS 183* 134* 127*  BILITOT 1.2 1.2 1.0  PROT 7.5 6.7 6.2*  ALBUMIN 3.3* 3.1* 2.8*   No results for input(s): LIPASE, AMYLASE in the last 168 hours. No results for input(s): AMMONIA in the last 168 hours. CBC: Recent Labs  Lab 02/08/21 1351 02/08/21 1401 02/08/21 1641 02/09/21 0256 02/10/21 0357  WBC 6.9  --   --  10.8* 7.8  NEUTROABS 6.0  --   --  9.2* 6.1  HGB 10.6*   < > 11.6* 9.7* 9.1*  HCT 34.8*   < > 34.0* 28.7* 27.9*  MCV 84.3  --   --  79.7* 81.1  PLT 377  --   --  367 356   < > = values in this interval not displayed.   Cardiac Enzymes: No results for input(s): CKTOTAL, CKMB, CKMBINDEX, TROPONINI in the last 168 hours. CBG: Recent Labs  Lab 02/09/21 0634 02/09/21 1255 02/09/21 1600 02/09/21 2104 02/10/21 0413  GLUCAP 179* 225* 300* 298* 197*     Iron Studies:  Recent Labs    02/10/21 0357  FERRITIN 554*   Studies/Results: CT Head Wo Contrast  Result Date: 02/08/2021 CLINICAL DATA:  Nontraumatic seizure. EXAM: CT HEAD WITHOUT CONTRAST TECHNIQUE: Contiguous axial images were obtained from the base of the skull through the vertex without intravenous contrast. COMPARISON:  Nov 26, 2020 FINDINGS: Brain: No evidence of acute infarction, hemorrhage, hydrocephalus, extra-axial collection or mass lesion/mass effect. Vascular: No hyperdense vessel or unexpected calcification. Skull: Normal. Negative for fracture or focal lesion. Sinuses/Orbits: No acute finding. Other: None. IMPRESSION: No acute intracranial abnormalities are identified. Electronically Signed   By: Dorise Bullion III M.D   On: 02/08/2021 15:09   MR BRAIN WO CONTRAST  Result Date: 02/10/2021 CLINICAL DATA:  Seizure EXAM: MRI HEAD WITHOUT CONTRAST TECHNIQUE: Multiplanar, multiecho pulse sequences of the brain and surrounding structures were obtained without intravenous contrast. COMPARISON:  None. FINDINGS: Brain: No acute infarct, mass effect or extra-axial collection. No acute or chronic hemorrhage. There is multifocal hyperintense T2-weighted signal within the [place] matter. Parenchymal volume and CSF spaces are normal. The midline structures are normal. Vascular: Major flow voids are preserved. Skull and upper cervical spine: Normal calvarium and skull base. Visualized upper cervical spine and soft tissues are normal. Sinuses/Orbits:No paranasal sinus fluid levels or advanced mucosal thickening. No mastoid or middle ear effusion. No acute orbital abnormality. 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 No acute intracranial abnormality. 2. Multifocal hyperintense T2-weighted signal within the [place] matter, nonspecific but most commonly indicating chronic small vessel disease. Electronically Signed   By: Ulyses Jarred M.D.   On: 02/10/2021 00:50   DG Chest  Portable 1 View  Result Date: 02/08/2021 CLINICAL DATA:  Cough,CP. EXAM: PORTABLE CHEST 1 VIEW COMPARISON:  Chest radiograph 11/25/2020 FINDINGS: Interval removal of a right central venous catheter. Stable cardiomediastinal contours with enlarged heart size. Low lung volumes. There is central vascular congestion and slight diffuse bilateral interstitial thickening. No new focal consolidation. No pneumothorax or large pleural effusion. No acute finding in the visualized skeleton. IMPRESSION: Findings suggestive of mild interstitial edema. No new focal consolidation. Electronically Signed   By: Audie Pinto M.D.   On: 02/08/2021 14:08   EEG adult  Result Date: 02/09/2021 Lora Havens, MD     02/09/2021 12:58 PM Patient Name: [name] MRN: [id number]  Epilepsy Attending: Lora Havens Referring Physician/Provider: Dr Wynetta Fines Date: 02/09/2021 Duration: 21.41 mins Patient history: 35 year old male presented with syncope, seizure-like activity and altered mental status.  EEG to evaluate for seizures. Level of alertness: Awake, asleep AEDs during EEG study: None Technical aspects: This EEG study was done with scalp electrodes positioned according to the 10-20 International system of electrode placement. Electrical activity was acquired at a sampling rate of '500Hz'$  and reviewed with a high frequency filter of '70Hz'$  and a low frequency filter of '1Hz'$ . EEG data were recorded continuously and digitally stored. Description: The posterior dominant rhythm consists of 8 Hz activity of moderate voltage (25-35 uV) seen predominantly in posterior head regions, symmetric and reactive to eye opening and eye closing. Sleep was characterized by sleep spindles (12 to 14 Hz), maximal frontocentral region. EEG showed continuous generalized and maximal left temporal 3 to 6 Hz theta-delta slowing. Hyperventilation and photic stimulation were not performed.   ABNORMALITY - Continuous slow, generalized and maximal left  temporal region IMPRESSION: This study is suggestive of cortical dysfunction in left temporal region which could be secondary to underlying structural abnormality, postictal state.  There is also mild diffuse encephalopathy, nonspecific etiology.  No seizures or epileptiform discharges were seen throughout the recording. Lora Havens   ECHOCARDIOGRAM LIMITED  Result Date: 02/09/2021    ECHOCARDIOGRAM LIMITED REPORT   Patient Name:   [name] Date of Exam: 02/09/2021 Medical Rec #:  [id number]       Height:       72.0 in Accession #:    [id number]      Weight:       205.9 lb Date of Birth:  [birth year]       BSA:          2.157 m Patient Age:    35 years        BP:           148/101 mmHg Patient Gender: M               HR:           85 bpm. Exam Location:  Inpatient Procedure: Limited Echo, 3D Echo, Cardiac Doppler, Color Doppler and Strain            Analysis Indications:    R55 Syncope  History:        Patient has prior history of Echocardiogram examinations, most                 recent 12/04/2019. Signs/Symptoms:Syncope, Altered Mental Status                 and Dyspnea; Risk Factors:Diabetes and Hypertension. Covid                 positive. ESRD. Hypoxia.  Sonographer:    Roseanna Rainbow RDCS Referring Phys: [id number] Lequita Halt  Sonographer Comments: Global longitudinal strain was attempted. IMPRESSIONS  1. Left ventricular ejection fraction, by estimation, is 45 to 50%. The left ventricle has mildly decreased function. The left ventricle demonstrates global hypokinesis. There is severe left ventricular hypertrophy. Left ventricular diastolic parameters  are indeterminate.  2. Right ventricular systolic function is normal. The right ventricular size is mildly enlarged. Moderately increased right ventricular wall thickness. There is moderately elevated pulmonary artery systolic pressure. The estimated right ventricular systolic pressure is [id number] mmHg.  3. The mitral valve is normal in structure. Trivial mitral  valve regurgitation.  4. Tricuspid valve regurgitation is  moderate.  5. The aortic valve is tricuspid. Aortic valve regurgitation is not visualized.  6. The inferior vena cava is dilated in size with <50% respiratory variability, suggesting right atrial pressure of 15 mmHg. FINDINGS  Left Ventricle: Left ventricular ejection fraction, by estimation, is 45 to 50%. The left ventricle has mildly decreased function. The left ventricle demonstrates global hypokinesis. The left ventricular internal cavity size was normal in size. There is  severe left ventricular hypertrophy. Left ventricular diastolic parameters are indeterminate. Right Ventricle: The right ventricular size is mildly enlarged. Moderately increased right ventricular wall thickness. Right ventricular systolic function is normal. There is moderately elevated pulmonary artery systolic pressure. The tricuspid regurgitant velocity is 3.05 m/s, and with an assumed right atrial pressure of 15 mmHg, the estimated right ventricular systolic pressure is [id number] mmHg. Pericardium: Trivial pericardial effusion is present. Mitral Valve: The mitral valve is normal in structure. Trivial mitral valve regurgitation. Tricuspid Valve: The tricuspid valve is normal in structure. Tricuspid valve regurgitation is moderate. Aortic Valve: The aortic valve is tricuspid. Aortic valve regurgitation is not visualized. Aorta: The aortic root is normal in size and structure. Venous: The inferior vena cava is dilated in size with less than 50% respiratory variability, suggesting right atrial pressure of 15 mmHg. LEFT VENTRICLE PLAX 2D LVIDd:         4.50 cm LVIDs:         3.70 cm LV PW:         2.30 cm LV IVS:        2.30 cm LVOT diam:     2.50 cm LVOT Area:     4.91 cm  LV Volumes (MOD) LV vol d, MOD A2C: 185.0 ml LV vol d, MOD A4C: 164.0 ml LV vol s, MOD A2C: 91.6 ml LV vol s, MOD A4C: 88.6 ml LV SV MOD A2C:     93.4 ml LV SV MOD A4C:     164.0 ml LV SV MOD BP:      87.3 ml IVC IVC  diam: 2.90 cm LEFT ATRIUM         Index LA diam:    4.10 cm 1.90 cm/m   AORTA Ao Root diam: 3.90 cm Ao Asc diam:  3.30 cm TRICUSPID VALVE TR Peak grad:   37.2 mmHg TR Vmax:        305.00 cm/s  SHUNTS Systemic Diam: 2.50 cm Oswaldo Milian MD Electronically signed by Oswaldo Milian MD Signature Date/Time: 02/09/2021/1:41:58 PM    Final    Medications: Infusions:  sodium chloride     sodium chloride     sodium chloride     promethazine (PHENERGAN) injection (IM or IVPB)      Scheduled Medications:  atorvastatin  40 mg Oral Daily   carvedilol  12.5 mg Oral BID WC   Chlorhexidine Gluconate Cloth  6 each Topical Q0600   doxercalciferol  5 mcg Intravenous Q M,W,F-HD   doxycycline  100 mg Oral Q12H   feeding supplement ([name] CARB STEADY)  237 mL Oral TID BM   heparin  5,000 Units Subcutaneous Q12H   hydrALAZINE  25 mg Oral TID   imipramine  25 mg Oral QHS   insulin aspart  0-5 Units Subcutaneous QHS   insulin aspart  0-6 Units Subcutaneous TID WC   insulin glargine-yfgn  5 Units Subcutaneous QHS   multivitamin  1 tablet Oral QHS   sevelamer carbonate  1,600 mg Oral TID WC   sodium chloride flush  3 mL  Intravenous Q12H   torsemide  40 mg Oral Q T,Th,S,Su    have reviewed scheduled and prn medications.  Physical Exam: General:  alert, NAD-  asking about breakfast Heart: RRR Lungs: mostly clear Abdomen:  soft, non tender Extremities: chronic edema Dialysis Access: l avf accessed for HD    02/10/2021,8:30 AM  LOS: 2 days

## 2021-02-10 NOTE — Progress Notes (Signed)
Initial Nutrition Assessment  DOCUMENTATION CODES:   Not applicable  INTERVENTION:  -Continue [name] Shake po TID, each supplement provides 425 kcal and 19 grams protein -Continue renal mvi daily -Provided education on renal/carb modified diet   NUTRITION DIAGNOSIS:   Increased nutrient needs related to chronic illness (ESRD on HD) as evidenced by estimated needs.  GOAL:   Patient will meet greater than or equal to 90% of their needs  MONITOR:   PO intake, Weight trends, Supplement acceptance, Labs, I & O's  REASON FOR ASSESSMENT:   Consult Assessment of nutrition requirement/status, Other (Comment) (MD consult says "call pt's caregiver/aunt about his diet")  ASSESSMENT:   Pt with PMH significant for ESRD on HD, IDDM off insulin, HTN, HLD, and legally blind presented with AMS 2/2 HHS/DKA with coma vs syncope vs seizure. Incidental finding of COVID+.  RD received consult to discuss pt's nutrition with his caregiver/aunt. Pt/Family not in room at time of RD visit. RD attempted to call pt's aunt, but call went directly to voicemail. RD unable to leave voicemail as voicemail box was full. Per RN, pt receiving HD. Left education material on pt's table and plan to follow-up as able to address any questions/concerns regarding diet recommendations.   Per H&P, pt's insulin was discontinued by one of his physicians ~2 months ago. Family also reports pt found to be consuming SSBs, fruits, and candies.  Per RN, pt reported feeling hungry this morning, though no PO intake has been documented. Pt with orders for [name] shakes TID.   EDW: 86.5 kg Current weight: 87.8 kg Admit weight: 93.4 kg  Medications: hectorol, SSI, semglee, rena-vit, renvela, IV phenergan Labs: Na 130 (L), Cr 15.01 (H), PO4 7.5 (H), Mg 2.5 (H), Ferritin 554 (H) CBGs: 197-300 x 24 hours  Hemoglobin A1c: 12.9 (taken 02/08/21)  NUTRITION - FOCUSED PHYSICAL EXAM: Unable to perform at this time. Will attempt at  follow-up.   Diet Order:   Diet Order             Diet renal/carb modified with fluid restriction Diet-HS Snack? Nothing; Fluid restriction: 1200 mL Fluid; Room service appropriate? Yes; Fluid consistency: Thin  Diet effective now                   EDUCATION NEEDS:   Education needs have been addressed  Skin:  Skin Assessment: Reviewed RN Assessment  Last BM:  8/1  Height:   Ht Readings from Last 1 Encounters:  02/08/21 6' (1.829 m)    Weight:   Wt Readings from Last 1 Encounters:  02/10/21 87.8 kg    BMI:  Body mass index is 26.25 kg/m.  Estimated Nutritional Needs:   Kcal:  2150-2350  Protein:  105-120 grams  Fluid:  1L+UOP    Larkin Ina, MS, RD, LDN (she/her/hers) RD pager number and weekend/on-call pager number located in [place].

## 2021-02-10 NOTE — Progress Notes (Addendum)
Inpatient Diabetes Program Recommendations  AACE/ADA: New Consensus Statement on Inpatient Glycemic Control   Target Ranges:  Prepandial:   less than 140 mg/dL      Peak postprandial:   less than 180 mg/dL (1-2 hours)      Critically ill patients:  140 - 180 mg/dL   Results for [name], [name] (MRN [id number]) as of 02/10/2021 07:51  Ref. Range 02/09/2021 06:34 02/09/2021 12:55 02/09/2021 16:00 02/09/2021 21:04 02/10/2021 04:13  Glucose-Capillary Latest Ref Range: 70 - 99 mg/dL 179 (H)     Semglee 5 units'@6'$ :45 225 (H)  Novolog 2 units 300 (H)  Novolog 3 units 298 (H)  Novolog 3 units  Semglee 5 units 197 (H)  Results for [name], [name] (MRN [id number]) as of 02/10/2021 07:51  Ref. Range 11/25/2020 00:06 02/08/2021 19:05  Hemoglobin A1C Latest Ref Range: 4.8 - 5.6 % 7.2 (H) 12.9 (H)   Review of Glycemic Control  Diabetes history: DM2 Outpatient Diabetes medications: Levemir 10 units daily Current orders for Inpatient glycemic control: Semglee 5 units QHS, Novlog 0-6 units TID with meals, Novolog 0-5 units QHS   Inpatient Diabetes Program Recommendations:    Insulin: Please consider increasing Semglee to 8 units QHS and ordering Novolog 3 units TID with meals for meal coverage if patient eats at least 50% of meals.  NOTE: Attempted to call patient's aunt [name] [phone]) 2 times today but no answer and unable to leave a message due to mailbox being full.   Addendum 02/10/21'@13'$ :50-Received a return call from patient's aunt [name]. She reports that the hospital number is coming up as potential spam that is why she has not answered the phone. [name] states that if anyone from the hospital calls she will try to answer it or call right back. I asked that she answer number starting with [phone] as they were likely hospital staff. [name] reports that [name] has been with her and her husband for 4 months and she states that they work full time and are not able to provide adequate care  for patient. She states that she has tried to get an aid or someone to be with patient but that has been unsuccessful. [name] states that she would like to find placement for patient because they can not provide care that patient needs and they are also planning to move to [place] in a couple of months and do not plan to take [place] with them. She states that she has reached out to [place] case worker at [facility] to try to get placement and has been awaiting a call back from them. [name] reports that since Jauan has been with her, she has not checked his glucose or given him any medication for DM as she was not aware that she needed to. [name] states that she has not even seen a glucometer if patient has one. Explained that patient will need to have glucose monitoring and he will also need to consistently take DM medication as his DM is not controlled as evidenced by A1C of 12.9%.  [name] states that patient goes to his appointments himself so she is not aware of how DM control has been. [name] also states that patient is able to do some things at home but he is legally blind and can not see.  She states that patient really needs more help and she would like to [facility] at the hospital to start process for placement. She states that patient does not really want to be placed  but she can not provide the care he needs. Informed [name] that I would communicate with attending provider, TOC, and bedside nursing regarding her request for placement. Carla asked if attending provider could call her to update her about patient and tests that he had yesterday. [name] verbalized understanding of information discussed and she states that she has no other questions at this time. Sent group communication to Dr. Eliseo Squires, Karie Georges, LCSW with [facility], and Geronimo Boot, RN regarding conversation with patient's aunt [name].  Thanks, Barnie Alderman, RN, MSN, CDE Diabetes Coordinator Inpatient Diabetes Program [phone] (Team Pager from 8am to  5pm)

## 2021-02-10 NOTE — Progress Notes (Signed)
Subjective: NAEO. No seizures.   ROS: negative except above  Examination  Vital signs in last 24 hours: Temp:  [96.6 F (35.9 C)-98.8 F (37.1 C)] 98.2 F (36.8 C) (08/02 1200) Pulse Rate:  [69-86] 79 (08/02 1200) Resp:  [0-24] 14 (08/02 1200) BP: (121-165)/(73-117) 154/90 (08/02 1200) SpO2:  [91 %-100 %] 95 % (08/02 1200) Weight:  [87.8 kg-95.9 kg] 87.8 kg (08/02 1200)  General: lying in bed, NAD CVS: pulse-normal rate and rhythm RS: breathing comfortably, CTAB Extremities: normal, warm Neuro: AOX3, legally blind, rest of the CN 2-12 grossly intact, spontaneously moving all 4 extremities  Basic Metabolic Panel: Recent Labs  Lab 02/08/21 1630 02/08/21 1641 02/08/21 1905 02/08/21 2317 02/09/21 0256 02/10/21 0357  NA 124* 124* 128* 133* 131*  130* 130*  K 6.2* 6.0* 5.0 4.4 4.9  4.9 4.8  CL 87*  --  90* 92* 91*  92* 90*  CO2 20*  --  '22 22 23  22 22  '$ GLUCOSE 783*  --  500* 99 153*  154* 204*  BUN 71*  --  71* 72* 73*  75* 90*  CREATININE 13.19*  --  13.13* 13.06* 13.43*  13.58* 15.01*  CALCIUM 8.2*  --  8.5* 8.7* 8.5*  8.4* 8.0*  MG  --   --   --   --  2.4 2.5*  PHOS  --   --   --   --  7.1* 7.5*    CBC: Recent Labs  Lab 02/08/21 1351 02/08/21 1401 02/08/21 1641 02/09/21 0256 02/10/21 0357  WBC 6.9  --   --  10.8* 7.8  NEUTROABS 6.0  --   --  9.2* 6.1  HGB 10.6* 12.6* 11.6* 9.7* 9.1*  HCT 34.8* 37.0* 34.0* 28.7* 27.9*  MCV 84.3  --   --  79.7* 81.1  PLT 377  --   --  367 356     Coagulation Studies: No results for input(s): LABPROT, INR in the last 72 hours.  Imaging MRI brain without contrast 02/09/2021: No acute intracranial abnormality. Multifocal hyperintense T2-weighted signal within the [place] matter, nonspecific but most commonly indicating chronic small vessel disease.  ASSESSMENT AND PLAN: 35 year old male with history of end-stage renal disease, diabetes who presented with new onset seizure like episode in the setting of DKA.   Provoked  seizure Diabetic ketoacidosis COVID-19 End-stage renal disease on hemodialysis -Routine EEG showed continuous slowing generalized, maximal left temporal region which is suggestive of cortical dysfunction in left temporal region likely secondary generalized acute abnormality or postictal state.  No definite ictal or interictal activity was noted. -This was most likely a provoked seizure in the setting of DKA   Recommendations: -Will not start any AEDs yet unless patient has any further seizures and this was most likely a provoked seizure -Continue seizure precautions including do not drive -f/u with neuro in 10-12 weeks  Seizure precautions: Per [facility] statutes, patients with seizures are not allowed to drive until they have been seizure-free for six months and cleared by a physician    Use caution when using heavy equipment or power tools. Avoid working on ladders or at heights. Take showers instead of baths. Ensure the water temperature is not too high on the home water heater. Do not go swimming alone. Do not lock yourself in a room alone (i.e. bathroom). When caring for infants or small children, sit down when holding, feeding, or changing them to minimize risk of injury to the child in the event  you have a seizure. Maintain good sleep hygiene. Avoid alcohol.    If patient has another seizure, call 911 and bring them back to the ED if: A.  The seizure lasts longer than 5 minutes.      B.  The patient doesn't wake shortly after the seizure or has new problems such as difficulty seeing, speaking or moving following the seizure C.  The patient was injured during the seizure D.  The patient has a temperature over 102 F (39C) E.  The patient vomited during the seizure and now is having trouble breathing    During the Seizure   - First, ensure adequate ventilation and place patients on the floor on their left side  Loosen clothing around the neck and ensure the airway is patent.  If the patient is clenching the teeth, do not force the mouth open with any object as this can cause severe damage - Remove all items from the surrounding that can be hazardous. The patient may be oblivious to what's happening and may not even know what he or she is doing. If the patient is confused and wandering, either gently guide him/her away and block access to outside areas - Reassure the individual and be comforting - Call 911. In most cases, the seizure ends before EMS arrives. However, there are cases when seizures may last over 3 to 5 minutes. Or the individual may have developed breathing difficulties or severe injuries. If a pregnant patient or a person with diabetes develops a seizure, it is prudent to call an ambulance. - Finally, if the patient does not regain full consciousness, then call EMS. Most patients will remain confused for about 45 to 90 minutes after a seizure, so you must use judgment in calling for help. - Avoid restraints but make sure the patient is in a bed with padded side rails - Place the individual in a lateral position with the neck slightly flexed; this will help the saliva drain from the mouth and prevent the tongue from falling backward - Remove all nearby furniture and other hazards from the area - Provide verbal assurance as the individual is regaining consciousness - Provide the patient with privacy if possible - Call for help and start treatment as ordered by the caregiver    After the Seizure (Postictal Stage)   After a seizure, most patients experience confusion, fatigue, muscle pain and/or a headache. Thus, one should permit the individual to sleep. For the next few days, reassurance is essential. Being calm and helping reorient the person is also of importance.   Most seizures are painless and end spontaneously. Seizures are not harmful to others but can lead to complications such as stress on the lungs, brain and the heart. Individuals with prior lung  problems may develop labored breathing and respiratory distress.    I have spent a total of  25 minutes with the patient reviewing hospital notes,  test results, labs and examining the patient as well as establishing an assessment and plan that was discussed personally with the patient.  > 50% of time was spent in direct patient care.     Thank you for allowing [place] to participate in the care of this patient.  Neurology will sign off.  If you have any further questions, please contact  me or neurohospitalist.   Zeb Comfort Epilepsy [facility] For questions after 5pm please refer to AMION to reach the Neurologist on call

## 2021-02-10 NOTE — Progress Notes (Signed)
OT Cancellation Note  Patient Details Name: [name] MRN: [id number] DOB: Jan 15, 1986   Cancelled Treatment:    Reason Eval/Treat Not Completed: Patient at procedure or test/ unavailable (at HD. Will attempt later time)  [facility] 02/10/2021, 10:36 AM Maurie Boettcher, OT/L   Acute OT Clinical Specialist Acute Rehabilitation Services Pager [phone] Office [phone]

## 2021-02-11 ENCOUNTER — Other Ambulatory Visit: Payer: Self-pay

## 2021-02-11 DIAGNOSIS — D631 Anemia in chronic kidney disease: Secondary | ICD-10-CM

## 2021-02-11 DIAGNOSIS — I10 Essential (primary) hypertension: Secondary | ICD-10-CM

## 2021-02-11 DIAGNOSIS — E870 Hyperosmolality and hypernatremia: Secondary | ICD-10-CM

## 2021-02-11 DIAGNOSIS — N185 Chronic kidney disease, stage 5: Secondary | ICD-10-CM

## 2021-02-11 DIAGNOSIS — E11621 Type 2 diabetes mellitus with foot ulcer: Secondary | ICD-10-CM

## 2021-02-11 DIAGNOSIS — L97511 Non-pressure chronic ulcer of other part of right foot limited to breakdown of skin: Secondary | ICD-10-CM

## 2021-02-11 DIAGNOSIS — N186 End stage renal disease: Secondary | ICD-10-CM | POA: Diagnosis not present

## 2021-02-11 LAB — COMPREHENSIVE METABOLIC PANEL
ALT: 25 U/L (ref 0–44)
AST: 15 U/L (ref 15–41)
Albumin: 2.8 g/dL — ABNORMAL LOW (ref 3.5–5.0)
Alkaline Phosphatase: 143 U/L — ABNORMAL HIGH (ref 38–126)
Anion gap: 12 (ref 5–15)
BUN: 52 mg/dL — ABNORMAL HIGH (ref 6–20)
CO2: 26 mmol/L (ref 22–32)
Calcium: 7.6 mg/dL — ABNORMAL LOW (ref 8.9–10.3)
Chloride: 94 mmol/L — ABNORMAL LOW (ref 98–111)
Creatinine, Ser: 10.07 mg/dL — ABNORMAL HIGH (ref 0.61–1.24)
GFR, Estimated: 6 mL/min — ABNORMAL LOW (ref >60)
Glucose, Bld: 357 mg/dL — ABNORMAL HIGH (ref 70–99)
Potassium: 4.2 mmol/L (ref 3.5–5.1)
Sodium: 132 mmol/L — ABNORMAL LOW (ref 135–145)
Total Bilirubin: 1.1 mg/dL (ref 0.3–1.2)
Total Protein: 6.5 g/dL (ref 6.5–8.1)

## 2021-02-11 LAB — GLUCOSE, CAPILLARY
Glucose-Capillary: 257 mg/dL — ABNORMAL HIGH (ref 70–99)
Glucose-Capillary: 219 mg/dL — ABNORMAL HIGH (ref 70–99)
Glucose-Capillary: 195 mg/dL — ABNORMAL HIGH (ref 70–99)
Glucose-Capillary: 165 mg/dL — ABNORMAL HIGH (ref 70–99)
Glucose-Capillary: 330 mg/dL — ABNORMAL HIGH (ref 70–99)

## 2021-02-11 LAB — C-REACTIVE PROTEIN: CRP: 0.8 mg/dL (ref <1.0)

## 2021-02-11 LAB — CBC WITH DIFFERENTIAL/PLATELET
Abs Immature Granulocytes: 0.02 10*3/uL (ref 0.00–0.07)
Basophils Absolute: 0 10*3/uL (ref 0.0–0.1)
Basophils Relative: 1 %
Eosinophils Absolute: 0.1 10*3/uL (ref 0.0–0.5)
Eosinophils Relative: 2 %
HCT: 32.8 % — ABNORMAL LOW (ref 39.0–52.0)
Hemoglobin: 10.1 g/dL — ABNORMAL LOW (ref 13.0–17.0)
Immature Granulocytes: 0 %
Lymphocytes Relative: 11 %
Lymphs Abs: 0.7 10*3/uL (ref 0.7–4.0)
MCH: 25.7 pg — ABNORMAL LOW (ref 26.0–34.0)
MCHC: 30.8 g/dL (ref 30.0–36.0)
MCV: 83.5 fL (ref 80.0–100.0)
Monocytes Absolute: 0.6 10*3/uL (ref 0.1–1.0)
Monocytes Relative: 8 %
Neutro Abs: 5.2 10*3/uL (ref 1.7–7.7)
Neutrophils Relative %: 78 %
Platelets: 382 10*3/uL (ref 150–400)
RBC: 3.93 MIL/uL — ABNORMAL LOW (ref 4.22–5.81)
RDW: 19.1 % — ABNORMAL HIGH (ref 11.5–15.5)
WBC: 6.6 10*3/uL (ref 4.0–10.5)
nRBC: 0 % (ref 0.0–0.2)

## 2021-02-11 LAB — D-DIMER, QUANTITATIVE: D-Dimer, Quant: 1.13 ug/mL-FEU — ABNORMAL HIGH (ref 0.00–0.50)

## 2021-02-11 LAB — PROCALCITONIN: Procalcitonin: 1.33 ng/mL

## 2021-02-11 LAB — FERRITIN: Ferritin: 555 ng/mL — ABNORMAL HIGH (ref 24–336)

## 2021-02-11 LAB — MAGNESIUM: Magnesium: 2 mg/dL (ref 1.7–2.4)

## 2021-02-11 LAB — PHOSPHORUS: Phosphorus: 5.4 mg/dL — ABNORMAL HIGH (ref 2.5–4.6)

## 2021-02-11 MED ORDER — INSULIN ASPART 100 UNIT/ML IJ SOLN
2.0000 [IU] | Freq: Three times a day (TID) | INTRAMUSCULAR | Status: DC
  Administered 2021-02-11 – 2021-02-15 (×11): 2 [IU] via SUBCUTANEOUS

## 2021-02-11 MED ORDER — CHLORHEXIDINE GLUCONATE CLOTH 2 % EX PADS
6.0000 | MEDICATED_PAD | Freq: Every day | CUTANEOUS | Status: DC
  Administered 2021-02-12 – 2021-02-28 (×14): 6 via TOPICAL

## 2021-02-11 MED ORDER — INSULIN GLARGINE-YFGN 100 UNIT/ML ~~LOC~~ SOLN
8.0000 [IU] | Freq: Every day | SUBCUTANEOUS | Status: DC
  Administered 2021-02-11: 8 [IU] via SUBCUTANEOUS
  Filled 2021-02-11 (×2): qty 0.08

## 2021-02-11 NOTE — Evaluation (Signed)
Occupational Therapy Evaluation Patient Details Name: [name] MRN: [id number] DOB: [birth year] Today's Date: 02/11/2021    History of Present Illness Pt is a 35yo male presenting to [facility] ED on 7/31 with syncopal episode and seizure. Found to be in acute hypoxemic respiratory failure and diabetic ketoacidosis. Of note, pt is +COVID upon admission. Imaging negative for acute findings. EEG noted cortical dysfunction  in left temporal region. PMH: ESRD on HD MWF, DM1, HTN, legally blind, active wound on right toes.   Clinical Impression   Pt PTA:Pt living with aunt, but not unable to stay with her/move with her. Pt unwilling to participate beyond EOB mobility at this time. Per RN and pt, pt was ambulating in room and performing own ADL with no physical assist. Pt requesting for OT to not come back for therapy sessions. OT signing off. Please re-order as needed.     Follow Up Recommendations  No OT follow up    Equipment Recommendations  None recommended by OT    Recommendations for Other Services       Precautions / Restrictions Precautions Precautions: Fall Precaution Comments: recent syncope; legally blind (reports he can see shadows) Restrictions Weight Bearing Restrictions: No      Mobility Bed Mobility Overal bed mobility: Independent                  Transfers Overall transfer level: Independent Equipment used: None             General transfer comment: pt would not move to test    Balance Overall balance assessment: No apparent balance deficits (not formally assessed)                                         ADL either performed or assessed with clinical judgement   ADL                                         General ADL Comments: Pt reports to be at baseline; pt's strength is WFLs and able to move BUEs/BLEs, but unwilling to get OOB. Pt and RN report that pt has been mobile in room and getting up to go to bathroom.  Pt denies any need for further OT skilled services.     Vision Baseline Vision/History: Legally blind Patient Visual Report: Other (comment) (can see shadows) Vision Assessment?: Vision impaired- to be further tested in functional context Additional Comments: Pt unwilling to open eyes throughout session.     Perception     Praxis      Pertinent Vitals/Pain Pain Assessment: No/denies pain     Hand Dominance Right   Extremity/Trunk Assessment Upper Extremity Assessment Upper Extremity Assessment: Overall WFL for tasks assessed   Lower Extremity Assessment Lower Extremity Assessment: Overall WFL for tasks assessed   Cervical / Trunk Assessment Cervical / Trunk Assessment: Normal   Communication Communication Communication: No difficulties   Cognition Arousal/Alertness: Awake/alert Behavior During Therapy: WFL for tasks assessed/performed Overall Cognitive Status: Within Functional Limits for tasks assessed                                 General Comments: Pt very flat affect and max cues to come to EOB, unable to  get pt motivated to walk a short distance.   General Comments  Pt requesting no further therapy at this time.    Exercises     Shoulder Instructions      Home Living Family/patient expects to be discharged to:: Unsure Living Arrangements: Other relatives (aunt) Available Help at Discharge: Family;Available 24 hours/day Type of Home: House Home Access: Level entry     Home Layout: One level     Bathroom Shower/Tub: Teacher, early years/pre: Standard                Prior Functioning/Environment Level of Independence: Independent        Comments: Transportation to medical appointments like dialysis, pt states he receives no assistance getting inside the doctor's office and reports he does not have any trouble        OT Problem List: Impaired vision/perception      OT Treatment/Interventions:      OT  Goals(Current goals can be found in the care plan section) Acute Rehab OT Goals Patient Stated Goal: to go home OT Goal Formulation: All assessment and education complete, DC therapy  OT Frequency:     Barriers to D/C:            Co-evaluation              AM-PAC OT "6 Clicks" Daily Activity     Outcome Measure Help from another person eating meals?: None Help from another person taking care of personal grooming?: None Help from another person toileting, which includes using toliet, bedpan, or urinal?: None Help from another person bathing (including washing, rinsing, drying)?: None Help from another person to put on and taking off regular upper body clothing?: None Help from another person to put on and taking off regular lower body clothing?: None 6 Click Score: 24   End of Session Nurse Communication: Mobility status  Activity Tolerance: Patient tolerated treatment well Patient left: in bed;with call bell/phone within reach;with nursing/sitter in room  OT Visit Diagnosis: Unsteadiness on feet (R26.81)                Time: [id number] OT Time Calculation (min): 21 min Charges:  OT General Charges $OT Visit: 1 Visit OT Evaluation $OT Eval Low Complexity: 1 Low  Jefferey Pica, OTR/L Acute Rehabilitation Services Pager: [phone] Office: [place] 02/11/2021, 1:59 PM

## 2021-02-11 NOTE — Progress Notes (Signed)
Subjective:    HD yest-  removed 4000-  BP stable for him-  sugar around 190-  260 this AM-  he hit his toe and it is bleeding superficially -  dont know about discharge plans  Objective Vital signs in last 24 hours: Vitals:   02/10/21 1339 02/10/21 1838 02/10/21 2100 02/11/21 0500  BP: (!) 163/97 125/73 126/82 (!) 167/107  Pulse: 85 82  78  Resp: 15 (!) '23 16 16  '$ Temp: 98.1 F (36.7 C)   98.4 F (36.9 C)  TempSrc: Oral   Oral  SpO2: 97% 94%  99%  Weight:      Height:       Weight change: -4.1 kg  Intake/Output Summary (Last 24 hours) at 02/11/2021 0748 Last data filed at 02/10/2021 1145 Gross per 24 hour  Intake --  Output 4000 ml  Net -4000 ml    Dialysis Orders: [facility] MWF 4 hrs 180NRe 450/800 86.5 kg 2.0 K/2.0 Ca L AVF -Heparin-none -[name] 5 mcg IV TIW -Venofer 100 mcg IV X 10 doses 3/7 doses given last dose 02/04/21 -Mircera 225 mcg IV q 2 weeks (last dose 02/02/2021)    Assessment/ Plan: Pt is a 35 y.o. yo male with ESRD who was admitted on 02/08/2021 with hyperglycemia and questionable sz    Assessment/Plan:  DKA VS HHS: Started on insulin  per primary with improvement in BS and K+. Per primary AMS: Thought to be related to elevated glucose +/- seizure. CT of head without acute changes/MRI neg as well. -  neuro involved no anti sz meds req COVID 19- Per primary-  on minimal O2, no medical treatment required Hypertensive Emergency: Medications and EDW adjusted at OP HD center last week. improved. HD today-continue to lower volume as tolerated. Coreg 12.5 BID-  hydralazine 25 tid which he will never be able to keep up with -  will change to cozaar 50 Torsemide 20 mg 2 tabs on non HD days.  BP is pretty good here-  suspect noncom with meds as OP  Hyperkalemia-resolved with correction of BS. R toe ulcer: On Doxycycline, WC consult per primary  ESRD -  MWF-missed HD 07/29 D/T transportation issues. No heparin. 2.0 K bath.  Treatment got postponed Monday to Tuesday-  will  plan for HD next on Thursday off schedule-  not sure of timing of possible discharge but could maybe get him back on his schedule in the OP setting   Hypertension/volume  - As noted above. UF as tolerated today. seems quite a bit over EDW-  challenging with HD -  will cont  Anemia  - HGB 9.7--9.1. Recent OP max ESA dose. Continue Fe load. Now over 10  Metabolic bone disease -  C Ca OK, PO4 elevated. Increase Renvela to 2 tabs PO TID AC-  phos better. Continue VDRA.   Nutrition -Albumin 3.1--2.8. Renal Carb mod diet, add nepro, renal vits.   Louis Meckel    Labs: Basic Metabolic Panel: Recent Labs  Lab 02/09/21 0256 02/10/21 0357 02/11/21 0210  NA 131*  130* 130* 132*  K 4.9  4.9 4.8 4.2  CL 91*  92* 90* 94*  CO2 '23  22 22 26  '$ GLUCOSE 153*  154* 204* 357*  BUN 73*  75* 90* 52*  CREATININE 13.43*  13.58* 15.01* 10.07*  CALCIUM 8.5*  8.4* 8.0* 7.6*  PHOS 7.1* 7.5* 5.4*   Liver Function Tests: Recent Labs  Lab 02/09/21 0256 02/10/21 0357 02/11/21 0210  AST 19  13* 15  ALT 36 25 25  ALKPHOS 134* 127* 143*  BILITOT 1.2 1.0 1.1  PROT 6.7 6.2* 6.5  ALBUMIN 3.1* 2.8* 2.8*   No results for input(s): LIPASE, AMYLASE in the last 168 hours. No results for input(s): AMMONIA in the last 168 hours. CBC: Recent Labs  Lab 02/08/21 1351 02/08/21 1401 02/09/21 0256 02/10/21 0357 02/11/21 0210  WBC 6.9  --  10.8* 7.8 6.6  NEUTROABS 6.0  --  9.2* 6.1 5.2  HGB 10.6*   < > 9.7* 9.1* 10.1*  HCT 34.8*   < > 28.7* 27.9* 32.8*  MCV 84.3  --  79.7* 81.1 83.5  PLT 377  --  367 356 382   < > = values in this interval not displayed.   Cardiac Enzymes: No results for input(s): CKTOTAL, CKMB, CKMBINDEX, TROPONINI in the last 168 hours. CBG: Recent Labs  Lab 02/09/21 2104 02/10/21 0413 02/10/21 1241 02/10/21 1826 02/10/21 2059  GLUCAP 298* 197* 192* 189* 212*    Iron Studies:  Recent Labs    02/11/21 0210  FERRITIN 555*   Studies/Results: MR BRAIN WO  CONTRAST  Result Date: 02/10/2021 CLINICAL DATA:  Seizure EXAM: MRI HEAD WITHOUT CONTRAST TECHNIQUE: Multiplanar, multiecho pulse sequences of the brain and surrounding structures were obtained without intravenous contrast. COMPARISON:  None. FINDINGS: Brain: No acute infarct, mass effect or extra-axial collection. No acute or chronic hemorrhage. There is multifocal hyperintense T2-weighted signal within the [place] matter. Parenchymal volume and CSF spaces are normal. The midline structures are normal. Vascular: Major flow voids are preserved. Skull and upper cervical spine: Normal calvarium and skull base. Visualized upper cervical spine and soft tissues are normal. Sinuses/Orbits:No paranasal sinus fluid levels or advanced mucosal thickening. No mastoid or middle ear effusion. No acute orbital abnormality. 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 No acute intracranial abnormality. 2. Multifocal hyperintense T2-weighted signal within the [place] matter, nonspecific but most commonly indicating chronic small vessel disease. Electronically Signed   By: Ulyses Jarred M.D.   On: 02/10/2021 00:50   EEG adult  Result Date: 02/09/2021 Lora Havens, MD     02/09/2021 12:58 PM Patient Name: [name] MRN: [id number] Epilepsy Attending: Lora Havens Referring Physician/Provider: Dr Wynetta Fines Date: 02/09/2021 Duration: 21.41 mins Patient history: 35 year old male presented with syncope, seizure-like activity and altered mental status.  EEG to evaluate for seizures. Level of alertness: Awake, asleep AEDs during EEG study: None Technical aspects: This EEG study was done with scalp electrodes positioned according to the 10-20 International system of electrode placement. Electrical activity was acquired at a sampling rate of '500Hz'$  and reviewed with a high frequency filter of '70Hz'$  and a low frequency filter of '1Hz'$ . EEG data were recorded continuously and digitally stored. Description: The posterior  dominant rhythm consists of 8 Hz activity of moderate voltage (25-35 uV) seen predominantly in posterior head regions, symmetric and reactive to eye opening and eye closing. Sleep was characterized by sleep spindles (12 to 14 Hz), maximal frontocentral region. EEG showed continuous generalized and maximal left temporal 3 to 6 Hz theta-delta slowing. Hyperventilation and photic stimulation were not performed.   ABNORMALITY - Continuous slow, generalized and maximal left temporal region IMPRESSION: This study is suggestive of cortical dysfunction in left temporal region which could be secondary to underlying structural abnormality, postictal state.  There is also mild diffuse encephalopathy, nonspecific etiology.  No seizures or epileptiform discharges were seen throughout the recording. [place]   ECHOCARDIOGRAM  LIMITED  Result Date: 02/09/2021    ECHOCARDIOGRAM LIMITED REPORT   Patient Name:   [name] Ault Date of Exam: 02/09/2021 Medical Rec #:  [id number]       Height:       72.0 in Accession #:    [id number]      Weight:       205.9 lb Date of Birth:  30-Mar-1986       BSA:          2.157 m Patient Age:    35 years        BP:           148/101 mmHg Patient Gender: M               HR:           85 bpm. Exam Location:  Inpatient Procedure: Limited Echo, 3D Echo, Cardiac Doppler, Color Doppler and Strain            Analysis Indications:    R55 Syncope  History:        Patient has prior history of Echocardiogram examinations, most                 recent 12/04/2019. Signs/Symptoms:Syncope, Altered Mental Status                 and Dyspnea; Risk Factors:Diabetes and Hypertension. Covid                 positive. ESRD. Hypoxia.  Sonographer:    Roseanna Rainbow RDCS Referring Phys: [id number] Lequita Halt  Sonographer Comments: Global longitudinal strain was attempted. IMPRESSIONS  1. Left ventricular ejection fraction, by estimation, is 45 to 50%. The left ventricle has mildly decreased function. The left ventricle  demonstrates global hypokinesis. There is severe left ventricular hypertrophy. Left ventricular diastolic parameters  are indeterminate.  2. Right ventricular systolic function is normal. The right ventricular size is mildly enlarged. Moderately increased right ventricular wall thickness. There is moderately elevated pulmonary artery systolic pressure. The estimated right ventricular systolic pressure is [id number] mmHg.  3. The mitral valve is normal in structure. Trivial mitral valve regurgitation.  4. Tricuspid valve regurgitation is moderate.  5. The aortic valve is tricuspid. Aortic valve regurgitation is not visualized.  6. The inferior vena cava is dilated in size with <50% respiratory variability, suggesting right atrial pressure of 15 mmHg. FINDINGS  Left Ventricle: Left ventricular ejection fraction, by estimation, is 45 to 50%. The left ventricle has mildly decreased function. The left ventricle demonstrates global hypokinesis. The left ventricular internal cavity size was normal in size. There is  severe left ventricular hypertrophy. Left ventricular diastolic parameters are indeterminate. Right Ventricle: The right ventricular size is mildly enlarged. Moderately increased right ventricular wall thickness. Right ventricular systolic function is normal. There is moderately elevated pulmonary artery systolic pressure. The tricuspid regurgitant velocity is 3.05 m/s, and with an assumed right atrial pressure of 15 mmHg, the estimated right ventricular systolic pressure is [id number] mmHg. Pericardium: Trivial pericardial effusion is present. Mitral Valve: The mitral valve is normal in structure. Trivial mitral valve regurgitation. Tricuspid Valve: The tricuspid valve is normal in structure. Tricuspid valve regurgitation is moderate. Aortic Valve: The aortic valve is tricuspid. Aortic valve regurgitation is not visualized. Aorta: The aortic root is normal in size and structure. Venous: The inferior vena cava is dilated  in size with less than 50% respiratory variability, suggesting right atrial pressure of 15 mmHg. LEFT VENTRICLE PLAX 2D  LVIDd:         4.50 cm LVIDs:         3.70 cm LV PW:         2.30 cm LV IVS:        2.30 cm LVOT diam:     2.50 cm LVOT Area:     4.91 cm  LV Volumes (MOD) LV vol d, MOD A2C: 185.0 ml LV vol d, MOD A4C: 164.0 ml LV vol s, MOD A2C: 91.6 ml LV vol s, MOD A4C: 88.6 ml LV SV MOD A2C:     93.4 ml LV SV MOD A4C:     164.0 ml LV SV MOD BP:      87.3 ml IVC IVC diam: 2.90 cm LEFT ATRIUM         Index LA diam:    4.10 cm 1.90 cm/m   AORTA Ao Root diam: 3.90 cm Ao Asc diam:  3.30 cm TRICUSPID VALVE TR Peak grad:   37.2 mmHg TR Vmax:        305.00 cm/s  SHUNTS Systemic Diam: 2.50 cm Oswaldo Milian MD Electronically signed by Oswaldo Milian MD Signature Date/Time: 02/09/2021/1:41:58 PM    Final    Medications: Infusions:  sodium chloride     promethazine (PHENERGAN) injection (IM or IVPB)      Scheduled Medications:  atorvastatin  40 mg Oral Daily   carvedilol  12.5 mg Oral BID WC   doxercalciferol  5 mcg Intravenous Q M,W,F-HD   doxycycline  100 mg Oral Q12H   feeding supplement ([name] CARB STEADY)  237 mL Oral TID BM   heparin  5,000 Units Subcutaneous Q12H   imipramine  25 mg Oral QHS   insulin aspart  0-5 Units Subcutaneous QHS   insulin aspart  0-6 Units Subcutaneous TID WC   insulin glargine-yfgn  5 Units Subcutaneous QHS   losartan  50 mg Oral QHS   multivitamin  1 tablet Oral QHS   sevelamer carbonate  1,600 mg Oral TID WC   sodium chloride flush  3 mL Intravenous Q12H   torsemide  40 mg Oral Q T,Th,S,Su    have reviewed scheduled and prn medications.  Physical Exam: General:  alert, NAD-  asking about breakfast Heart: RRR Lungs: mostly clear Abdomen:  soft, non tender Extremities: chronic edema Dialysis Access: l avf  patent   02/11/2021,7:48 AM  LOS: 3 days

## 2021-02-11 NOTE — Progress Notes (Signed)
Inpatient Diabetes Program Recommendations  AACE/ADA: New Consensus Statement on Inpatient Glycemic Control   Target Ranges:  Prepandial:   less than 140 mg/dL      Peak postprandial:   less than 180 mg/dL (1-2 hours)      Critically ill patients:  140 - 180 mg/dL   Results for JAYION, FAVINGER (MRN [id number]) as of 02/11/2021 09:26  Ref. Range 02/10/2021 04:13 02/10/2021 12:41 02/10/2021 18:26 02/10/2021 20:59 02/11/2021 07:59  Glucose-Capillary Latest Ref Range: 70 - 99 mg/dL 197 (H) 192 (H) 189 (H) 212 (H) 257 (H)   Review of Glycemic Control  Diabetes history: DM2 Outpatient Diabetes medications: Levemir 10 units daily Current orders for Inpatient glycemic control: Semglee 5 units QHS, Novlog 0-6 units TID with meals, Novolog 0-5 units QHS   Inpatient Diabetes Program Recommendations:     Insulin: Please consider increasing Semglee to 8 units QHS and ordering Novolog 2 units TID with meals for meal coverage if patient eats at least 50% of meals.   Thanks, Barnie Alderman, RN, MSN, CDE Diabetes Coordinator Inpatient Diabetes Program [phone] (Team Pager from 8am to 5pm)

## 2021-02-11 NOTE — Progress Notes (Addendum)
Progress Note    [name]  [id number] DOB: January 20, 1986  DOA: 02/08/2021 PCP: Gildardo Pounds, NP    Brief Narrative:   [name] is an 35 y.o. male with medical history significant of ESRD on HD, diabetes mellitus off insulin, hypertension, hyperlipidemia, legally blind presented to hospital with syncope versus seizure.  He was thought to have seizures from elevated blood glucose levels.  He used to live with his aunt but she is unable to take him back home.  TOC consulted and awaiting for disposition plan..  Assessment/Plan:   Principal Problem:   Seizure-like activity (HCC) Active Problems:   Essential hypertension   DM2 (diabetes mellitus, type 2) (HCC)   Anemia of chronic kidney failure   Diabetic foot ulcer ([place])   Syncope   DKA (diabetic ketoacidosis) ([place])   Acute metabolic encephalopathy  Thought to be initially to be secondary to elevated blood glucose level/seizure disorder.  EEG with questionable dysfunction in the left temporal region.  MRI was negative so unlikely seizures was noted to be from elevated blood glucose levels.  2D echocardiogram performed showed EF of 45% which will need outpatient follow-up.    Hyperosmolar state with history of type 2 diabetes.  Currently with hyperglycemia.  Has been transitioned to subcu insulin.  Was not on insulin as outpatient.  Diabetic coordinator on board.  Insulin Regimen has been adjusted.  Essential hypertension, Continue Coreg, torsemide, losartan.  Acute on chronic diastolic CHF  Improved after hemodialysis.   COVID-19 infection- incidental Chest x-ray with fluid overload initially.  No fever or other signs of infection.Given significant comorbidities at baseline, ER decided to give monoclonal antibody infusion x1 by admitter   Hyperkalemia Resolved.  On hemodialysis.  Latest potassium of 4.2.   Right toes ulcer -continue doxycycline as per podiatry no fever or leukocytosis.   ESRD on HD Monday  Wednesday Friday. Continue hemodialysis as per nephrology.   Code Status: Full Code.   DispositionPlan: Status is: Inpatient  Remains inpatient appropriate because:Inpatient level of care appropriate due to severity of illness  Dispo: The patient is from: Home              Anticipated d/c is to: Undetermined at this time.  Physical therapy has been consulted              Patient currently is not medically stable to d/c.   Difficult to place patient No    Medical Consultants:   Nephrology Neurology  Subjective:   Today, patient was seen and examined at bedside.  Denies any nausea vomiting fever shortness of breath chest pain.  Blood glucose levels were noted to be elevated.  Objective:    Vitals:   02/10/21 1838 02/10/21 2100 02/11/21 0500 02/11/21 0800  BP: 125/73 126/82 (!) 167/107 (!) 156/109  Pulse: 82  78 77  Resp: (!) '23 16 16 18  '$ Temp:   98.4 F (36.9 C) 98.4 F (36.9 C)  TempSrc:   Oral Oral  SpO2: 94%  99%   Weight:      Height:       No intake or output data in the 24 hours ending 02/11/21 1202  Filed Weights   02/10/21 0416 02/10/21 0715 02/10/21 1200  Weight: 95.9 kg 91.8 kg 87.8 kg    Physical exam  General: Overweight built, not in obvious distress HENT:   No scleral pallor or icterus noted. Oral mucosa is moist.  Legally blind Chest:  Clear breath sounds.  Diminished breath sounds bilaterally. No crackles or wheezes.  CVS: S1 &S2 heard. No murmur.  Regular rate and rhythm. Abdomen: Soft, nontender, nondistended.  Bowel sounds are heard.   Extremities: No cyanosis, clubbing or edema.  Peripheral pulses are palpable.  Left upper extremity AV fistula Psych: Alert, awake and oriented, flat affect CNS:  No cranial nerve deficits.  Power equal in all extremities.   Skin: Warm and dry.  No rashes noted.  Data Reviewed:   I have personally reviewed the following labs and imaging studies  Labs:  Basic Metabolic Panel: Recent Labs  Lab  02/08/21 1905 02/08/21 2317 02/09/21 0256 02/10/21 0357 02/11/21 0210  NA 128* 133* 131*  130* 130* 132*  K 5.0 4.4 4.9  4.9 4.8 4.2  CL 90* 92* 91*  92* 90* 94*  CO2 '22 22 23  22 22 26  '$ GLUCOSE 500* 99 153*  154* 204* 357*  BUN 71* 72* 73*  75* 90* 52*  CREATININE 13.13* 13.06* 13.43*  13.58* 15.01* 10.07*  CALCIUM 8.5* 8.7* 8.5*  8.4* 8.0* 7.6*  MG  --   --  2.4 2.5* 2.0  PHOS  --   --  7.1* 7.5* 5.4*    GFR Estimated Creatinine Clearance: 11.1 mL/min (A) (by C-G formula based on SCr of 10.07 mg/dL (H)). Liver Function Tests: Recent Labs  Lab 02/08/21 1351 02/09/21 0256 02/10/21 0357 02/11/21 0210  AST 26 19 13* 15  ALT 41 36 25 25  ALKPHOS 183* 134* 127* 143*  BILITOT 1.2 1.2 1.0 1.1  PROT 7.5 6.7 6.2* 6.5  ALBUMIN 3.3* 3.1* 2.8* 2.8*    No results for input(s): LIPASE, AMYLASE in the last 168 hours. No results for input(s): AMMONIA in the last 168 hours. Coagulation profile No results for input(s): INR, PROTIME in the last 168 hours.  CBC: Recent Labs  Lab 02/08/21 1351 02/08/21 1401 02/08/21 1641 02/09/21 0256 02/10/21 0357 02/11/21 0210  WBC 6.9  --   --  10.8* 7.8 6.6  NEUTROABS 6.0  --   --  9.2* 6.1 5.2  HGB 10.6* 12.6* 11.6* 9.7* 9.1* 10.1*  HCT 34.8* 37.0* 34.0* 28.7* 27.9* 32.8*  MCV 84.3  --   --  79.7* 81.1 83.5  PLT 377  --   --  367 356 382    Cardiac Enzymes: No results for input(s): CKTOTAL, CKMB, CKMBINDEX, TROPONINI in the last 168 hours. BNP (last 3 results) No results for input(s): PROBNP in the last 8760 hours. CBG: Recent Labs  Lab 02/10/21 0413 02/10/21 1241 02/10/21 1826 02/10/21 2059 02/11/21 0759  GLUCAP 197* 192* 189* 212* 257*    D-Dimer: Recent Labs    02/10/21 0357 02/11/21 0210  DDIMER 0.97* 1.13*    Hgb A1c: Recent Labs    02/08/21 1905  HGBA1C 12.9*    Lipid Profile: No results for input(s): CHOL, HDL, LDLCALC, TRIG, CHOLHDL, LDLDIRECT in the last 72 hours. Thyroid function studies: No  results for input(s): TSH, T4TOTAL, T3FREE, THYROIDAB in the last 72 hours.  Invalid input(s): FREET3 Anemia work up: Recent Labs    02/10/21 0357 02/11/21 0210  FERRITIN 554* 555*    Sepsis Labs: Recent Labs  Lab 02/08/21 1351 02/08/21 1425 02/08/21 1630 02/08/21 1905 02/09/21 0256 02/10/21 0357 02/11/21 0210  PROCALCITON  --   --   --  1.10  --   --  1.33  WBC 6.9  --   --   --  10.8* 7.8 6.6  LATICACIDVEN  --  3.9* 2.6*  --   --   --   --      Microbiology Recent Results (from the past 240 hour(s))  Resp Panel by RT-PCR (Flu A&B, Covid) Nasopharyngeal Swab     Status: Abnormal   Collection Time: 02/08/21  2:08 PM   Specimen: Nasopharyngeal Swab; Nasopharyngeal(NP) swabs in vial transport medium  Result Value Ref Range Status   SARS Coronavirus 2 by RT PCR POSITIVE (A) NEGATIVE Final    Comment: RESULT CALLED TO, READ BACK BY AND VERIFIED WITH: B,WRIGHT '@1613'$  02/08/21 EB (NOTE) SARS-CoV-2 target nucleic acids are DETECTED.  The SARS-CoV-2 RNA is generally detectable in upper respiratory specimens during the acute phase of infection. Positive results are indicative of the presence of the identified virus, but do not rule out bacterial infection or co-infection with other pathogens not detected by the test. Clinical correlation with patient history and other diagnostic information is necessary to determine patient infection status. The expected result is Negative.  Fact Sheet for Patients: EntrepreneurPulse.com.au  Fact Sheet for Healthcare Providers: IncredibleEmployment.be  This test is not yet approved or cleared by the [place] FDA and  has been authorized for detection and/or diagnosis of SARS-CoV-2 by FDA under an Emergency Use Authorization (EUA).  This EUA will remain in effect (meaning this test can be used) f or the duration of  the COVID-19 declaration under Section 564(b)(1) of the Act, 21 U.S.C. section  360bbb-3(b)(1), unless the authorization is terminated or revoked sooner.     Influenza A by PCR NEGATIVE NEGATIVE Final   Influenza B by PCR NEGATIVE NEGATIVE Final    Comment: (NOTE) The Xpert Xpress SARS-CoV-2/FLU/RSV plus assay is intended as an aid in the diagnosis of influenza from Nasopharyngeal swab specimens and should not be used as a sole basis for treatment. Nasal washings and aspirates are unacceptable for Xpert Xpress SARS-CoV-2/FLU/RSV testing.  Fact Sheet for Patients: EntrepreneurPulse.com.au  Fact Sheet for Healthcare Providers: IncredibleEmployment.be  This test is not yet approved or cleared by the [place] FDA and has been authorized for detection and/or diagnosis of SARS-CoV-2 by FDA under an Emergency Use Authorization (EUA). This EUA will remain in effect (meaning this test can be used) for the duration of the COVID-19 declaration under Section 564(b)(1) of the Act, 21 U.S.C. section 360bbb-3(b)(1), unless the authorization is terminated or revoked.  Performed at [facility], [place] [address] [place],  [address]     Procedures and diagnostic studies:  MR BRAIN WO CONTRAST  Result Date: 02/10/2021 CLINICAL DATA:  Seizure EXAM: MRI HEAD WITHOUT CONTRAST TECHNIQUE: Multiplanar, multiecho pulse sequences of the brain and surrounding structures were obtained without intravenous contrast. COMPARISON:  None. FINDINGS: Brain: No acute infarct, mass effect or extra-axial collection. No acute or chronic hemorrhage. There is multifocal hyperintense T2-weighted signal within the [place] matter. Parenchymal volume and CSF spaces are normal. The midline structures are normal. Vascular: Major flow voids are preserved. Skull and upper cervical spine: Normal calvarium and skull base. Visualized upper cervical spine and soft tissues are normal. Sinuses/Orbits:No paranasal sinus fluid levels or advanced mucosal thickening. No  mastoid or middle ear effusion. No acute orbital abnormality. 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 No acute intracranial abnormality. 2. Multifocal hyperintense T2-weighted signal within the [place] matter, nonspecific but most commonly indicating chronic small vessel disease. Electronically Signed   By: Ulyses Jarred M.D.   On: 02/10/2021 00:50   EEG adult  Result Date: 02/09/2021 Lora Havens, MD  02/09/2021 12:58 PM Patient Name: [name] MRN: [id number] Epilepsy Attending: Lora Havens Referring Physician/Provider: Dr Wynetta Fines Date: 02/09/2021 Duration: 21.41 mins Patient history: 35 year old male presented with syncope, seizure-like activity and altered mental status.  EEG to evaluate for seizures. Level of alertness: Awake, asleep AEDs during EEG study: None Technical aspects: This EEG study was done with scalp electrodes positioned according to the 10-20 International system of electrode placement. Electrical activity was acquired at a sampling rate of '500Hz'$  and reviewed with a high frequency filter of '70Hz'$  and a low frequency filter of '1Hz'$ . EEG data were recorded continuously and digitally stored. Description: The posterior dominant rhythm consists of 8 Hz activity of moderate voltage (25-35 uV) seen predominantly in posterior head regions, symmetric and reactive to eye opening and eye closing. Sleep was characterized by sleep spindles (12 to 14 Hz), maximal frontocentral region. EEG showed continuous generalized and maximal left temporal 3 to 6 Hz theta-delta slowing. Hyperventilation and photic stimulation were not performed.   ABNORMALITY - Continuous slow, generalized and maximal left temporal region IMPRESSION: This study is suggestive of cortical dysfunction in left temporal region which could be secondary to underlying structural abnormality, postictal state.  There is also mild diffuse encephalopathy, nonspecific etiology.  No seizures or epileptiform  discharges were seen throughout the recording. Priyanka Barbra Sarks    Medications:    atorvastatin  40 mg Oral Daily   carvedilol  12.5 mg Oral BID WC   doxercalciferol  5 mcg Intravenous Q M,W,F-HD   doxycycline  100 mg Oral Q12H   feeding supplement ([name] CARB STEADY)  237 mL Oral TID BM   heparin  5,000 Units Subcutaneous Q12H   imipramine  25 mg Oral QHS   insulin aspart  0-5 Units Subcutaneous QHS   insulin aspart  0-6 Units Subcutaneous TID WC   insulin aspart  2 Units Subcutaneous TID WC   insulin glargine-yfgn  8 Units Subcutaneous QHS   losartan  50 mg Oral QHS   multivitamin  1 tablet Oral QHS   sevelamer carbonate  1,600 mg Oral TID WC   sodium chloride flush  3 mL Intravenous Q12H   torsemide  40 mg Oral Q T,Th,S,Su   Continuous Infusions:  sodium chloride     promethazine (PHENERGAN) injection (IM or IVPB)       LOS: 3 days   Flora Lipps, MD [facility] 02/11/2021, 12:02 PM

## 2021-02-11 NOTE — Progress Notes (Signed)
02/11/21 1052  PT Visit Information  Last PT Received On 02/11/21  Assistance Needed +1  History of Present Illness Pt is a 35yo male presenting to [facility] ED on 7/31 with syncopal episode and seizure. Found to be in acute hypoxemic respiratory failure and diabetic ketoacidosis. Of note, pt is +COVID upon admission. Imaging negative for acute findings. EEG noted cortical dysfunction  in left temporal region. PMH: ESRD on HD MWF, DM1, HTN, legally blind, active wound on right toes.  Precautions  Precautions Fall  Precaution Comments recent syncope; legally blind (reports he can see shadows)  Restrictions  Weight Bearing Restrictions No  Home Living  Family/patient expects to be discharged to: Unsure  Living Arrangements Other relatives (Aunt and uncle)  Available Help at Discharge Family;Available 24 hours/day  Type of Home House  Home Access Level entry  Home Layout One level  Bathroom Child psychotherapist  Prior Function  Level of Independence Independent  Comments Transportation to medical appointments like dialysis, pt states he receives no assistance getting inside the doctor's office and reports he does not have any trouble  Communication  Communication No difficulties  Pain Assessment  Pain Assessment No/denies pain  Cognition  Arousal/Alertness Awake/alert  Behavior During Therapy Flat affect  Overall Cognitive Status No family/caregiver present to determine baseline cognitive functioning  General Comments Pt very flat and initially did not want to participate. Required max cues to ambulate short distance.  Upper Extremity Assessment  Upper Extremity Assessment Defer to OT evaluation  Lower Extremity Assessment  Lower Extremity Assessment Overall WFL for tasks assessed  Cervical / Trunk Assessment  Cervical / Trunk Assessment Normal  Bed Mobility  Overal bed mobility Independent  Transfers  Overall transfer level Independent  Equipment  used None  Ambulation/Gait  Ambulation/Gait assistance Supervision  Gait Distance (Feet) 20 Feet  Assistive device None  Gait Pattern/deviations WFL(Within Functional Limits)  General Gait Details Pt walked from bed to door and back and demonstrated some signs of his visual impairment by bumping into PT, chair, but later reports he can see shadows, so unsure if this may have been intentional. Per pt report he has been getting up and going to the bathroom independently and has not been bumping into obstacles.  Gait velocity mildly decreased  Balance  Overall balance assessment No apparent balance deficits (not formally assessed)  General Comments  General comments (skin integrity, edema, etc.) Pt requesting PT to sign off at end of session.  PT - End of Session  Activity Tolerance Treatment limited secondary to agitation  Patient left in bed;with call bell/phone within reach  Nurse Communication Mobility status  PT Assessment  PT Recommendation/Assessment Patent does not need any further PT services  PT Visit Diagnosis Muscle weakness (generalized) (M62.81);Difficulty in walking, not elsewhere classified (R26.2)  No Skilled PT Other (comment) (Pt requested we sign off)  AM-PAC PT "6 Clicks" Mobility Outcome Measure (Version 2)  Help needed turning from your back to your side while in a flat bed without using bedrails? 4  Help needed moving from lying on your back to sitting on the side of a flat bed without using bedrails? 4  Help needed moving to and from a bed to a chair (including a wheelchair)? 4  Help needed standing up from a chair using your arms (e.g., wheelchair or bedside chair)? 4  Help needed to walk in hospital room? 3  Help needed climbing 3-5 steps with a railing?  3  6 Click Score 22  Consider Recommendation of Discharge To: Home with no services  Progressive Mobility  What is the highest level of mobility based on the progressive mobility assessment? Level 5 (Walks with  assist in room/hall) - Balance while stepping forward/back and can walk in room with assist - Complete  Mobility Ambulated with assistance in room  PT Recommendation  Follow Up Recommendations No PT follow up (pt refusing follow up; may benefit from services of the blind)  PT equipment None recommended by PT  Acute Rehab PT Goals  Patient Stated Goal to go home  PT Goal Formulation With patient  Time For Goal Achievement 02/11/21  Potential to Achieve Goals Fair  PT Time Calculation  PT Start Time (ACUTE ONLY) 1044  PT Stop Time (ACUTE ONLY) 1101  PT Time Calculation (min) (ACUTE ONLY) 17 min  PT General Charges  $$ ACUTE PT VISIT 1 Visit  PT Evaluation  $PT Eval Moderate Complexity 1 Mod   Pt presents with the impairments and problems listed above. Pt initially unwilling to participate in therapy but eventually agreed to be seen with max encouragement. Pt independent with bed mobility, transfers. Pt required supervision for ambulation. Demonstrated signs of visual deficit: bumped into PT and a chair (possibly intentional) while walking to door. Per pt report he is able to get out of bed and to the restroom independently and can see shadows and colors. Pt asymptomatic for duration of session. Pt reporting he does not want further acute PT and does not want follow-up. Per pt request, PT is signing off; should his status change please re-consult.   Dawayne Cirri, SPT Dawayne Cirri 02/11/2021, 11:23 AM

## 2021-02-11 NOTE — TOC Initial Note (Signed)
Transition of Care [facility]) - Initial/Assessment Note    Patient Details  Name: [name] MRN: [id number] Date of Birth: [birth year]  [facility] CM/SW Contact:    Pollie Friar, RN Phone Number: 02/11/2021, 12:19 PM  Clinical Narrative:                 No f/u per PT. Pt also states she doesn't want to work with therapy again. CM attempted to reach the patient via phone without success. (Pt is covid +). CM spoke to pts aunt over the phone. She states he needs LTC as they are moving and cant take him with them. She was open to having [facility] reach out to her to assist in the process. Forms sent to [facility] with [facility] and she will reach out to the aunt.  For patient to d/c to SNF prior to LTC he is going to need to participate with therapy. CM has update the aunt with this information. TOC following.  Expected Discharge Plan: Skilled Nursing Facility Barriers to Discharge: Continued Medical Work up   Patient Goals and CMS Choice   CMS Medicare.gov Compare Post Acute Care list provided to:: Patient Choice offered to / list presented to :  (aunt)  Expected Discharge Plan and Services Expected Discharge Plan: [place]   Discharge Planning Services: CM Consult Post Acute Care Choice: [place] Living arrangements for the past 2 months: Single Family Home                                      Prior Living Arrangements/Services Living arrangements for the past 2 months: Single Family Home Lives with:: Relatives Patient language and need for interpreter reviewed:: Yes Do you feel safe going back to the place where you live?: Yes      Need for Family Participation in Patient Care: Yes (Comment) Care giver support system in place?: No (comment)   Criminal Activity/Legal Involvement Pertinent to Current Situation/Hospitalization: No - Comment as needed  Activities of Daily Living Home Assistive  Devices/Equipment: None ADL Screening (condition at time of admission) Patient's cognitive ability adequate to safely complete daily activities?: No Is the patient deaf or have difficulty hearing?: No Does the patient have difficulty seeing, even when wearing glasses/contacts?: Yes Does the patient have difficulty concentrating, remembering, or making decisions?: Yes Patient able to express need for assistance with ADLs?: Yes Does the patient have difficulty dressing or bathing?: No Independently performs ADLs?: Yes (appropriate for developmental age) Does the patient have difficulty walking or climbing stairs?: No Weakness of Legs: Both Weakness of Arms/Hands: Both  Permission Sought/Granted                  Emotional Assessment           Psych Involvement: No (comment)  Admission diagnosis:  DKA (diabetic ketoacidosis) ([place]) [E11.10] Hyperglycemia [R73.9] ESRD (end stage renal disease) ([place]) [N18.6] Seizure-like activity ([place]) [R56.9] Acute hypoxemic respiratory failure ([place]) [J96.01] Patient Active Problem List   Diagnosis Date Noted   Syncope 02/08/2021   DKA (diabetic ketoacidosis) ([place]) 02/08/2021   Seizure-like activity ([place])    Hyperglycemia    Acute hypoxemic respiratory failure ([place]) 11/25/2020   Diabetic foot ulcer ([place]) 11/24/2020   Hypervolemia associated with renal insufficiency 08/07/2020   Uremia [id number]   Metabolic acidosis 123XX123   Hyperkalemia 08/07/2020  Nausea and vomiting 05/07/2020   Anemia of chronic kidney failure 05/07/2020   Dehydration    Hypertensive urgency 02/25/2020   Acute renal failure superimposed on stage 4 chronic kidney disease ([place]) 12/02/2019   Anemia 12/02/2019   Essential hypertension 12/02/2019   SOB (shortness of breath) 12/02/2019   DM2 (diabetes mellitus, type 2) ([place]) 12/02/2019   Diarrhea 03/23/2017   Early satiety 03/23/2017   Generalized abdominal pain 03/23/2017   PCP:  Gildardo Pounds,  NP Pharmacy:   [facility] [place] [address] [place] [address] Phone: [phone] Fax: [phone]     Social Determinants of Health (SDOH) Interventions    Readmission Risk Interventions Readmission Risk Prevention Plan 11/27/2020 02/29/2020  Transportation Screening Complete Complete  Home Care Screening - Complete  Medication Review ([place]) Complete -  PCP or Specialist appointment within 3-5 days of discharge Complete -  [place] or Home Care Consult Complete -  SW Recovery Care/Counseling Consult Complete -  Palliative Care Screening Not Applicable -  [place] Not Applicable -  Some recent data might be hidden

## 2021-02-12 DIAGNOSIS — I10 Essential (primary) hypertension: Secondary | ICD-10-CM | POA: Diagnosis not present

## 2021-02-12 DIAGNOSIS — Z794 Long term (current) use of insulin: Secondary | ICD-10-CM

## 2021-02-12 DIAGNOSIS — Z992 Dependence on renal dialysis: Secondary | ICD-10-CM

## 2021-02-12 DIAGNOSIS — E11621 Type 2 diabetes mellitus with foot ulcer: Secondary | ICD-10-CM | POA: Diagnosis not present

## 2021-02-12 DIAGNOSIS — N185 Chronic kidney disease, stage 5: Secondary | ICD-10-CM | POA: Diagnosis not present

## 2021-02-12 DIAGNOSIS — E1122 Type 2 diabetes mellitus with diabetic chronic kidney disease: Secondary | ICD-10-CM

## 2021-02-12 LAB — COMPREHENSIVE METABOLIC PANEL
ALT: 20 U/L (ref 0–44)
AST: 12 U/L — ABNORMAL LOW (ref 15–41)
Albumin: 2.7 g/dL — ABNORMAL LOW (ref 3.5–5.0)
Alkaline Phosphatase: 130 U/L — ABNORMAL HIGH (ref 38–126)
Anion gap: 16 — ABNORMAL HIGH (ref 5–15)
BUN: 63 mg/dL — ABNORMAL HIGH (ref 6–20)
CO2: 22 mmol/L (ref 22–32)
Calcium: 7.9 mg/dL — ABNORMAL LOW (ref 8.9–10.3)
Chloride: 88 mmol/L — ABNORMAL LOW (ref 98–111)
Creatinine, Ser: 11.37 mg/dL — ABNORMAL HIGH (ref 0.61–1.24)
GFR, Estimated: 5 mL/min — ABNORMAL LOW (ref >60)
Glucose, Bld: 266 mg/dL — ABNORMAL HIGH (ref 70–99)
Potassium: 4.3 mmol/L (ref 3.5–5.1)
Sodium: 126 mmol/L — ABNORMAL LOW (ref 135–145)
Total Bilirubin: 0.7 mg/dL (ref 0.3–1.2)
Total Protein: 6.1 g/dL — ABNORMAL LOW (ref 6.5–8.1)

## 2021-02-12 LAB — CBC WITH DIFFERENTIAL/PLATELET
Abs Immature Granulocytes: 0.02 10*3/uL (ref 0.00–0.07)
Basophils Absolute: 0 10*3/uL (ref 0.0–0.1)
Basophils Relative: 1 %
Eosinophils Absolute: 0.1 10*3/uL (ref 0.0–0.5)
Eosinophils Relative: 2 %
HCT: 31.8 % — ABNORMAL LOW (ref 39.0–52.0)
Hemoglobin: 9.9 g/dL — ABNORMAL LOW (ref 13.0–17.0)
Immature Granulocytes: 0 %
Lymphocytes Relative: 17 %
Lymphs Abs: 1 10*3/uL (ref 0.7–4.0)
MCH: 25.8 pg — ABNORMAL LOW (ref 26.0–34.0)
MCHC: 31.1 g/dL (ref 30.0–36.0)
MCV: 83 fL (ref 80.0–100.0)
Monocytes Absolute: 0.5 10*3/uL (ref 0.1–1.0)
Monocytes Relative: 8 %
Neutro Abs: 4.5 10*3/uL (ref 1.7–7.7)
Neutrophils Relative %: 72 %
Platelets: 348 10*3/uL (ref 150–400)
RBC: 3.83 MIL/uL — ABNORMAL LOW (ref 4.22–5.81)
RDW: 18.6 % — ABNORMAL HIGH (ref 11.5–15.5)
WBC: 6.2 10*3/uL (ref 4.0–10.5)
nRBC: 0 % (ref 0.0–0.2)

## 2021-02-12 LAB — GLUCOSE, CAPILLARY
Glucose-Capillary: 265 mg/dL — ABNORMAL HIGH (ref 70–99)
Glucose-Capillary: 183 mg/dL — ABNORMAL HIGH (ref 70–99)
Glucose-Capillary: 323 mg/dL — ABNORMAL HIGH (ref 70–99)
Glucose-Capillary: 351 mg/dL — ABNORMAL HIGH (ref 70–99)

## 2021-02-12 LAB — C-REACTIVE PROTEIN: CRP: 0.6 mg/dL (ref <1.0)

## 2021-02-12 LAB — FERRITIN: Ferritin: 486 ng/mL — ABNORMAL HIGH (ref 24–336)

## 2021-02-12 LAB — PROCALCITONIN: Procalcitonin: 0.84 ng/mL

## 2021-02-12 LAB — PHOSPHORUS: Phosphorus: 5.8 mg/dL — ABNORMAL HIGH (ref 2.5–4.6)

## 2021-02-12 LAB — MAGNESIUM: Magnesium: 2.4 mg/dL (ref 1.7–2.4)

## 2021-02-12 LAB — D-DIMER, QUANTITATIVE: D-Dimer, Quant: 0.74 ug/mL-FEU — ABNORMAL HIGH (ref 0.00–0.50)

## 2021-02-12 MED ORDER — INSULIN GLARGINE-YFGN 100 UNIT/ML ~~LOC~~ SOLN
11.0000 [IU] | Freq: Every day | SUBCUTANEOUS | Status: DC
  Administered 2021-02-12 – 2021-02-14 (×3): 11 [IU] via SUBCUTANEOUS
  Filled 2021-02-12 (×4): qty 0.11

## 2021-02-12 MED ORDER — DARBEPOETIN ALFA 60 MCG/0.3ML IJ SOSY
60.0000 ug | PREFILLED_SYRINGE | INTRAMUSCULAR | Status: DC
  Filled 2021-02-12: qty 0.3

## 2021-02-12 NOTE — Progress Notes (Signed)
Progress Note    [name]  [id number] DOB: 12-Apr-1986  DOA: 02/08/2021 PCP: James Pounds, NP    Brief Narrative:   [name] is an 35 y.o. male with medical history significant of ESRD on HD, diabetes mellitus off insulin, hypertension, hyperlipidemia, legally blind presented to hospital with syncope versus seizure.  He was thought to have seizures from elevated blood glucose levels.  He used to live with his aunt but she is unable to take him back home.  TOC consulted and awaiting for disposition plan..  Assessment/Plan:   Principal Problem:   Seizure-like activity (HCC) Active Problems:   Essential hypertension   DM2 (diabetes mellitus, type 2) (HCC)   Anemia of chronic kidney failure   Diabetic foot ulcer ([place])   Syncope   DKA (diabetic ketoacidosis) ([place])   Acute metabolic encephalopathy  Thought to be initially to be secondary to elevated blood glucose level/seizure disorder.  EEG with questionable dysfunction in the left temporal region.  MRI was negative so unlikely seizures was noted to be from elevated blood glucose levels.  2D echocardiogram performed showed EF of 45% which will need outpatient follow-up.    Hyperosmolar state with history of type 2 diabetes, legally blind.   Improved hyperglycemia at this time.  Has been transitioned to subcu insulin.  Was not on insulin as outpatient.  Diabetic coordinator on board.  Insulin Regimen has been adjusted.  Essential hypertension, Continue Coreg, torsemide, losartan.  Acute on chronic diastolic CHF  Improved after hemodialysis.  Compensated at this time.   COVID-19 infection- incidental Chest x-ray with fluid overload initially.  No fever or other signs of infection.Given significant comorbidities at baseline, ER decided to give monoclonal antibody infusion x1 by admitter   Hyperkalemia Resolved.  On hemodialysis.  Latest potassium of 4.3   Right toes ulcer -continue doxycycline as per podiatry  no fever or leukocytosis.   ESRD on HD Monday Wednesday Friday. Continue hemodialysis as per nephrology.   Code Status: Full Code.   DispositionPlan: Status is: Inpatient  Remains inpatient appropriate because:Inpatient level of care appropriate due to severity of illness disposition issue.  Dispo: The patient is from: Home              Anticipated d/c is to: Undetermined at this time.  No special PT needs.  Patient does not have a place to go and he is unsafe discharge due to blindness and need for medical intervention..              Patient currently is  medically stable to d/c.   Difficult to place patient No  Medical Consultants:   Nephrology Neurology  Subjective:   Today, was seen and examined.  Denies interval complaints no shortness of breath chest pain palpitation  Objective:    Vitals:   02/12/21 1130 02/12/21 1200 02/12/21 1225 02/12/21 1253  BP: (!) 163/91 (!) 154/82 (!) 168/93 (!) 166/94  Pulse: 80 82 82 87  Resp:    20  Temp:   97.7 F (36.5 C) 98.7 F (37.1 C)  TempSrc:   Oral   SpO2:   100% 100%  Weight:   89 kg   Height:        Intake/Output Summary (Last 24 hours) at 02/12/2021 1358 Last data filed at 02/12/2021 1225 Gross per 24 hour  Intake --  Output 3500 ml  Net -3500 ml    Filed Weights   02/12/21 0500 02/12/21 0810 02/12/21 1225  Weight: 92.5 kg 92.5 kg 89 kg    Physical exam  General: Overweight built, not in obvious distress HENT:   No scleral pallor or icterus noted. Oral mucosa is moist.  Legally blind Chest: Diminished breath sounds bilaterally. CVS: S1 &S2 heard. No murmur.  Regular rate and rhythm. Abdomen: Soft, nontender, nondistended.  Bowel sounds are heard.   Extremities: No cyanosis, clubbing or edema.  Peripheral pulses are palpable.  Left upper extremity AV fistula Psych: Alert, awake and communicative, flat affect CNS:  No cranial nerve deficits.  Power equal in all extremities.   Skin: Warm and dry.  No rashes  noted.  Data Reviewed:   I have personally reviewed the following labs and imaging studies  Labs:  Basic Metabolic Panel: Recent Labs  Lab 02/08/21 2317 02/09/21 0256 02/10/21 0357 02/11/21 0210 02/12/21 0509  NA 133* 131*  130* 130* 132* 126*  K 4.4 4.9  4.9 4.8 4.2 4.3  CL 92* 91*  92* 90* 94* 88*  CO2 '22 23  22 22 26 22  '$ GLUCOSE 99 153*  154* 204* 357* 266*  BUN 72* 73*  75* 90* 52* 63*  CREATININE 13.06* 13.43*  13.58* 15.01* 10.07* 11.37*  CALCIUM 8.7* 8.5*  8.4* 8.0* 7.6* 7.9*  MG  --  2.4 2.5* 2.0 2.4  PHOS  --  7.1* 7.5* 5.4* 5.8*    GFR Estimated Creatinine Clearance: 9.9 mL/min (A) (by C-G formula based on SCr of 11.37 mg/dL (H)). Liver Function Tests: Recent Labs  Lab 02/08/21 1351 02/09/21 0256 02/10/21 0357 02/11/21 0210 02/12/21 0509  AST 26 19 13* 15 12*  ALT 41 36 '25 25 20  '$ ALKPHOS 183* 134* 127* 143* 130*  BILITOT 1.2 1.2 1.0 1.1 0.7  PROT 7.5 6.7 6.2* 6.5 6.1*  ALBUMIN 3.3* 3.1* 2.8* 2.8* 2.7*    No results for input(s): LIPASE, AMYLASE in the last 168 hours. No results for input(s): AMMONIA in the last 168 hours. Coagulation profile No results for input(s): INR, PROTIME in the last 168 hours.  CBC: Recent Labs  Lab 02/08/21 1351 02/08/21 1401 02/08/21 1641 02/09/21 0256 02/10/21 0357 02/11/21 0210 02/12/21 0509  WBC 6.9  --   --  10.8* 7.8 6.6 6.2  NEUTROABS 6.0  --   --  9.2* 6.1 5.2 4.5  HGB 10.6*   < > 11.6* 9.7* 9.1* 10.1* 9.9*  HCT 34.8*   < > 34.0* 28.7* 27.9* 32.8* 31.8*  MCV 84.3  --   --  79.7* 81.1 83.5 83.0  PLT 377  --   --  367 356 382 348   < > = values in this interval not displayed.    Cardiac Enzymes: No results for input(s): CKTOTAL, CKMB, CKMBINDEX, TROPONINI in the last 168 hours. BNP (last 3 results) No results for input(s): PROBNP in the last 8760 hours. CBG: Recent Labs  Lab 02/11/21 1510 02/11/21 1635 02/11/21 2135 02/12/21 0631 02/12/21 1250  GLUCAP 195* 165* 330* 265* 183*     D-Dimer: Recent Labs    02/11/21 0210 02/12/21 0509  DDIMER 1.13* 0.74*    Hgb A1c: No results for input(s): HGBA1C in the last 72 hours.  Lipid Profile: No results for input(s): CHOL, HDL, LDLCALC, TRIG, CHOLHDL, LDLDIRECT in the last 72 hours. Thyroid function studies: No results for input(s): TSH, T4TOTAL, T3FREE, THYROIDAB in the last 72 hours.  Invalid input(s): FREET3 Anemia work up: Recent Labs    02/11/21 0210 02/12/21 0509  FERRITIN 555* 486*  Sepsis Labs: Recent Labs  Lab 02/08/21 1425 02/08/21 1630 02/08/21 1905 02/09/21 0256 02/10/21 0357 02/11/21 0210 02/12/21 0509  PROCALCITON  --   --  1.10  --   --  1.33 0.84  WBC  --   --   --  10.8* 7.8 6.6 6.2  LATICACIDVEN 3.9* 2.6*  --   --   --   --   --      Microbiology Recent Results (from the past 240 hour(s))  Resp Panel by RT-PCR (Flu A&B, Covid) Nasopharyngeal Swab     Status: Abnormal   Collection Time: 02/08/21  2:08 PM   Specimen: Nasopharyngeal Swab; Nasopharyngeal(NP) swabs in vial transport medium  Result Value Ref Range Status   SARS Coronavirus 2 by RT PCR POSITIVE (A) NEGATIVE Final    Comment: RESULT CALLED TO, READ BACK BY AND VERIFIED WITH: B,WRIGHT '@1613'$  02/08/21 EB (NOTE) SARS-CoV-2 target nucleic acids are DETECTED.  The SARS-CoV-2 RNA is generally detectable in upper respiratory specimens during the acute phase of infection. Positive results are indicative of the presence of the identified virus, but do not rule out bacterial infection or co-infection with other pathogens not detected by the test. Clinical correlation with patient history and other diagnostic information is necessary to determine patient infection status. The expected result is Negative.  Fact Sheet for Patients: EntrepreneurPulse.com.au  Fact Sheet for Healthcare Providers: IncredibleEmployment.be  This test is not yet approved or cleared by the [place] FDA  and  has been authorized for detection and/or diagnosis of SARS-CoV-2 by FDA under an Emergency Use Authorization (EUA).  This EUA will remain in effect (meaning this test can be used) f or the duration of  the COVID-19 declaration under Section 564(b)(1) of the Act, 21 U.S.C. section 360bbb-3(b)(1), unless the authorization is terminated or revoked sooner.     Influenza A by PCR NEGATIVE NEGATIVE Final   Influenza B by PCR NEGATIVE NEGATIVE Final    Comment: (NOTE) The Xpert Xpress SARS-CoV-2/FLU/RSV plus assay is intended as an aid in the diagnosis of influenza from Nasopharyngeal swab specimens and should not be used as a sole basis for treatment. Nasal washings and aspirates are unacceptable for Xpert Xpress SARS-CoV-2/FLU/RSV testing.  Fact Sheet for Patients: EntrepreneurPulse.com.au  Fact Sheet for Healthcare Providers: IncredibleEmployment.be  This test is not yet approved or cleared by the [place] FDA and has been authorized for detection and/or diagnosis of SARS-CoV-2 by FDA under an Emergency Use Authorization (EUA). This EUA will remain in effect (meaning this test can be used) for the duration of the COVID-19 declaration under Section 564(b)(1) of the Act, 21 U.S.C. section 360bbb-3(b)(1), unless the authorization is terminated or revoked.  Performed at [facility], [place] [address] [place], [place] [address]     Procedures and diagnostic studies:  No results found.  Medications:    atorvastatin  40 mg Oral Daily   carvedilol  12.5 mg Oral BID WC   Chlorhexidine Gluconate Cloth  6 each Topical Q0600   [START ON 02/14/2021] darbepoetin (ARANESP) injection - DIALYSIS  60 mcg Intravenous Q Sat-HD   doxercalciferol  5 mcg Intravenous Q M,W,F-HD   doxycycline  100 mg Oral Q12H   feeding supplement ([name] CARB STEADY)  237 mL Oral TID BM   heparin  5,000 Units Subcutaneous Q12H   imipramine  25 mg Oral QHS    insulin aspart  0-5 Units Subcutaneous QHS   insulin aspart  0-6 Units Subcutaneous TID WC  insulin aspart  2 Units Subcutaneous TID WC   insulin glargine-yfgn  8 Units Subcutaneous QHS   losartan  50 mg Oral QHS   multivitamin  1 tablet Oral QHS   sevelamer carbonate  1,600 mg Oral TID WC   sodium chloride flush  3 mL Intravenous Q12H   torsemide  40 mg Oral Q T,Th,S,Su   Continuous Infusions:  sodium chloride     promethazine (PHENERGAN) injection (IM or IVPB)       LOS: 4 days   Flora Lipps, MD [facility] 02/12/2021, 1:58 PM

## 2021-02-12 NOTE — TOC Progression Note (Signed)
Transition of Care [facility]) - Progression Note    Patient Details  Name: [name] MRN: [id number] Date of Birth: 1985/10/19  Transition [facility]) CM/SW Contact  Pollie Friar, RN Phone Number: 02/12/2021, 4:28 PM  Clinical Narrative:    CM spoke to the patient and his aunt. Provided aunt with information on Group Homes that patient should qualify for.  Pt would need to participate with therapy to get into SNF rehab. Aunt updated and will speak with the patient.  TOC following.   Expected Discharge Plan: [place] Barriers to Discharge: Continued Medical Work up  Expected Discharge Plan and Services Expected Discharge Plan: [place]   Discharge Planning Services: CM Consult Post Acute Care Choice: [place] Living arrangements for the past 2 months: Single Family Home                                       Social Determinants of Health (SDOH) Interventions    Readmission Risk Interventions Readmission Risk Prevention Plan 11/27/2020 02/29/2020  Transportation Screening Complete Complete  Home Care Screening - Complete  Medication Review ([place]) Complete -  PCP or Specialist appointment within 3-5 days of discharge Complete -  [place] or Home Care Consult Complete -  SW Recovery Care/Counseling Consult Complete -  Palliative Care Screening Not Applicable -  [place] Not Applicable -  Some recent data might be hidden

## 2021-02-12 NOTE — Progress Notes (Signed)
Inpatient Diabetes Program Recommendations  AACE/ADA: New Consensus Statement on Inpatient Glycemic Control   Target Ranges:  Prepandial:   less than 140 mg/dL      Peak postprandial:   less than 180 mg/dL (1-2 hours)      Critically ill patients:  140 - 180 mg/dL   Results for AYDN, BOLIVAR (MRN [id number]) as of 02/12/2021 14:41  Ref. Range 02/11/2021 07:59 02/11/2021 12:18 02/11/2021 15:10 02/11/2021 16:35 02/11/2021 21:35 02/12/2021 06:31 02/12/2021 12:50  Glucose-Capillary Latest Ref Range: 70 - 99 mg/dL 257 (H) 219 (H) 195 (H) 165 (H) 330 (H) 265 (H) 183 (H)    Review of Glycemic Control  Diabetes history: DM2 Outpatient Diabetes medications: Levemir 10 units daily (not taken in at least 4 months) Current orders for Inpatient glycemic control: Semglee 8 units QHS, Novlog 0-6 units TID with meals, Novolog 0-5 units QHS, Novolog 2 units TID with meals   Inpatient Diabetes Program Recommendations:     Insulin: Please consider increasing Semglee to 11 units QHS.   Thanks, Barnie Alderman, RN, MSN, CDE Diabetes Coordinator Inpatient Diabetes Program [phone] (Team Pager from 8am to 5pm)

## 2021-02-12 NOTE — Progress Notes (Signed)
Pt refused doxycycline because "it hurts my stomach".

## 2021-02-12 NOTE — Progress Notes (Signed)
Subjective:    Seen on HD, no c/o's- sugar as high as 330 -  unfortunately the family he had been staying with can no longer care for him-  will need SNF-  he did not know this information -  I had to tell him   Objective Vital signs in last 24 hours: Vitals:   02/11/21 1937 02/11/21 2347 02/12/21 0324 02/12/21 0500  BP: (!) 145/93 (!) 152/87 (!) 151/95   Pulse: 75 78 78   Resp: 18  18   Temp: 98.6 F (37 C) 98.2 F (36.8 C) 99 F (37.2 C)   TempSrc: Oral Oral Oral   SpO2: 100% 100% 100%   Weight:    92.5 kg  Height:       Weight change: 0.7 kg No intake or output data in the 24 hours ending 02/12/21 0816   Dialysis Orders: [facility] MWF 4 hrs 180NRe 450/800 86.5 kg 2.0 K/2.0 Ca L AVF -Heparin-none -[name] 5 mcg IV TIW -Venofer 100 mcg IV X 10 doses 3/7 doses given last dose 02/04/21 -Mircera 225 mcg IV q 2 weeks (last dose 02/02/2021)    Assessment/ Plan: Pt is a 35 y.o. yo male with ESRD who was admitted on 02/08/2021 with hyperglycemia and questionable sz    Assessment/Plan:  DKA VS HHS: Started on insulin  per primary with improvement in BS and K+. Per primary AMS: Thought to be related to elevated glucose +/- seizure. CT of head without acute changes/MRI neg as well. -  neuro involved no anti sz meds req COVID 19- Per primary-  on minimal O2, no medical treatment required Hypertensive Emergency: Medications and EDW adjusted at OP HD center last week. improved. HD today-continue to lower volume as tolerated. Coreg 12.5 BID-   cozaar 50 Torsemide 20 mg 2 tabs on non HD days.  BP is pretty good here-  suspect noncom with meds as OP  Hyperkalemia-resolved with correction of BS. R toe ulcer: On Doxycycline, WC consult per primary  ESRD -  MWF-missed HD 07/29 D/T transportation issues. No heparin. 2.0 K bath.  Treatment got postponed Monday to Tuesday-  will plan for HD next today off schedule-  not sure of timing of possible discharge but could maybe get him back on his schedule  in the OP setting -  likely will do Sat then Mon to get back on schedule   Hypertension/volume  - As noted above. UF as tolerated today. seems quite a bit over EDW-  challenging with HD -  will cont  Anemia  - HGB 9.7--9.1. ESA here weekly   Metabolic bone disease -  C Ca OK, PO4 elevated. Increase Renvela to 2 tabs PO TID AC-  phos better. Continue VDRA.   Nutrition -Albumin 3.1--2.8. Renal Carb mod diet, add nepro, renal vits. Dispo-  will likely need placement at age 35 :(    [name]    Labs: Basic Metabolic Panel: Recent Labs  Lab 02/10/21 0357 02/11/21 0210 02/12/21 0509  NA 130* 132* 126*  K 4.8 4.2 4.3  CL 90* 94* 88*  CO2 '22 26 22  '$ GLUCOSE 204* 357* 266*  BUN 90* 52* 63*  CREATININE 15.01* 10.07* 11.37*  CALCIUM 8.0* 7.6* 7.9*  PHOS 7.5* 5.4* 5.8*   Liver Function Tests: Recent Labs  Lab 02/10/21 0357 02/11/21 0210 02/12/21 0509  AST 13* 15 12*  ALT '25 25 20  '$ ALKPHOS 127* 143* 130*  BILITOT 1.0 1.1 0.7  PROT 6.2* 6.5 6.1*  ALBUMIN 2.8* 2.8* 2.7*   No results for input(s): LIPASE, AMYLASE in the last 168 hours. No results for input(s): AMMONIA in the last 168 hours. CBC: Recent Labs  Lab 02/08/21 1351 02/08/21 1401 02/09/21 0256 02/10/21 0357 02/11/21 0210 02/12/21 0509  WBC 6.9  --  10.8* 7.8 6.6 6.2  NEUTROABS 6.0  --  9.2* 6.1 5.2 4.5  HGB 10.6*   < > 9.7* 9.1* 10.1* 9.9*  HCT 34.8*   < > 28.7* 27.9* 32.8* 31.8*  MCV 84.3  --  79.7* 81.1 83.5 83.0  PLT 377  --  367 356 382 348   < > = values in this interval not displayed.   Cardiac Enzymes: No results for input(s): CKTOTAL, CKMB, CKMBINDEX, TROPONINI in the last 168 hours. CBG: Recent Labs  Lab 02/11/21 1218 02/11/21 1510 02/11/21 1635 02/11/21 2135 02/12/21 0631  GLUCAP 219* 195* 165* 330* 265*    Iron Studies:  Recent Labs    02/12/21 0509  FERRITIN 486*   Studies/Results: No results found. Medications: Infusions:  sodium chloride     promethazine  (PHENERGAN) injection (IM or IVPB)      Scheduled Medications:  atorvastatin  40 mg Oral Daily   carvedilol  12.5 mg Oral BID WC   Chlorhexidine Gluconate Cloth  6 each Topical Q0600   doxercalciferol  5 mcg Intravenous Q M,W,F-HD   doxycycline  100 mg Oral Q12H   feeding supplement ([name] CARB STEADY)  237 mL Oral TID BM   heparin  5,000 Units Subcutaneous Q12H   imipramine  25 mg Oral QHS   insulin aspart  0-5 Units Subcutaneous QHS   insulin aspart  0-6 Units Subcutaneous TID WC   insulin aspart  2 Units Subcutaneous TID WC   insulin glargine-yfgn  8 Units Subcutaneous QHS   losartan  50 mg Oral QHS   multivitamin  1 tablet Oral QHS   sevelamer carbonate  1,600 mg Oral TID WC   sodium chloride flush  3 mL Intravenous Q12H   torsemide  40 mg Oral Q T,Th,S,Su    have reviewed scheduled and prn medications.  Physical Exam: General:  alert, NAD-   c/o ear issue-  have not been able to assess  Heart: RRR Lungs: mostly clear Abdomen:  soft, non tender Extremities: chronic edema Dialysis Access: l avf  patent   02/12/2021,8:16 AM  LOS: 4 days

## 2021-02-12 NOTE — Procedures (Signed)
Patient was seen on dialysis and the procedure was supervised.  BFR 400  Via AVF BP is  151/95.   Patient appears to be tolerating treatment well-   trying for a lot of UF   [name] 02/12/2021

## 2021-02-13 ENCOUNTER — Other Ambulatory Visit: Payer: Self-pay

## 2021-02-13 DIAGNOSIS — E11621 Type 2 diabetes mellitus with foot ulcer: Secondary | ICD-10-CM | POA: Diagnosis not present

## 2021-02-13 DIAGNOSIS — I10 Essential (primary) hypertension: Secondary | ICD-10-CM | POA: Diagnosis not present

## 2021-02-13 DIAGNOSIS — E1122 Type 2 diabetes mellitus with diabetic chronic kidney disease: Secondary | ICD-10-CM | POA: Diagnosis not present

## 2021-02-13 DIAGNOSIS — N185 Chronic kidney disease, stage 5: Secondary | ICD-10-CM | POA: Diagnosis not present

## 2021-02-13 LAB — GLUCOSE, CAPILLARY
Glucose-Capillary: 192 mg/dL — ABNORMAL HIGH (ref 70–99)
Glucose-Capillary: 154 mg/dL — ABNORMAL HIGH (ref 70–99)
Glucose-Capillary: 154 mg/dL — ABNORMAL HIGH (ref 70–99)
Glucose-Capillary: 320 mg/dL — ABNORMAL HIGH (ref 70–99)

## 2021-02-13 LAB — CBC WITH DIFFERENTIAL/PLATELET
Abs Immature Granulocytes: 0.02 10*3/uL (ref 0.00–0.07)
Basophils Absolute: 0 10*3/uL (ref 0.0–0.1)
Basophils Relative: 1 %
Eosinophils Absolute: 0.1 10*3/uL (ref 0.0–0.5)
Eosinophils Relative: 2 %
HCT: 31.5 % — ABNORMAL LOW (ref 39.0–52.0)
Hemoglobin: 10 g/dL — ABNORMAL LOW (ref 13.0–17.0)
Immature Granulocytes: 0 %
Lymphocytes Relative: 20 %
Lymphs Abs: 1.2 10*3/uL (ref 0.7–4.0)
MCH: 26.2 pg (ref 26.0–34.0)
MCHC: 31.7 g/dL (ref 30.0–36.0)
MCV: 82.5 fL (ref 80.0–100.0)
Monocytes Absolute: 0.6 10*3/uL (ref 0.1–1.0)
Monocytes Relative: 10 %
Neutro Abs: 3.8 10*3/uL (ref 1.7–7.7)
Neutrophils Relative %: 67 %
Platelets: 334 10*3/uL (ref 150–400)
RBC: 3.82 MIL/uL — ABNORMAL LOW (ref 4.22–5.81)
RDW: 18.6 % — ABNORMAL HIGH (ref 11.5–15.5)
WBC: 5.7 10*3/uL (ref 4.0–10.5)
nRBC: 0 % (ref 0.0–0.2)

## 2021-02-13 LAB — COMPREHENSIVE METABOLIC PANEL
ALT: 21 U/L (ref 0–44)
AST: 16 U/L (ref 15–41)
Albumin: 2.5 g/dL — ABNORMAL LOW (ref 3.5–5.0)
Alkaline Phosphatase: 120 U/L (ref 38–126)
Anion gap: 9 (ref 5–15)
BUN: 43 mg/dL — ABNORMAL HIGH (ref 6–20)
CO2: 27 mmol/L (ref 22–32)
Calcium: 7.8 mg/dL — ABNORMAL LOW (ref 8.9–10.3)
Chloride: 93 mmol/L — ABNORMAL LOW (ref 98–111)
Creatinine, Ser: 8.25 mg/dL — ABNORMAL HIGH (ref 0.61–1.24)
GFR, Estimated: 8 mL/min — ABNORMAL LOW (ref >60)
Glucose, Bld: 260 mg/dL — ABNORMAL HIGH (ref 70–99)
Potassium: 4.2 mmol/L (ref 3.5–5.1)
Sodium: 129 mmol/L — ABNORMAL LOW (ref 135–145)
Total Bilirubin: 0.8 mg/dL (ref 0.3–1.2)
Total Protein: 5.8 g/dL — ABNORMAL LOW (ref 6.5–8.1)

## 2021-02-13 LAB — PHOSPHORUS: Phosphorus: 4.5 mg/dL (ref 2.5–4.6)

## 2021-02-13 LAB — FERRITIN: Ferritin: 448 ng/mL — ABNORMAL HIGH (ref 24–336)

## 2021-02-13 LAB — C-REACTIVE PROTEIN: CRP: 0.6 mg/dL (ref <1.0)

## 2021-02-13 LAB — MAGNESIUM: Magnesium: 2.1 mg/dL (ref 1.7–2.4)

## 2021-02-13 LAB — D-DIMER, QUANTITATIVE: D-Dimer, Quant: 1.06 ug/mL-FEU — ABNORMAL HIGH (ref 0.00–0.50)

## 2021-02-13 MED ORDER — CARVEDILOL 25 MG PO TABS
25.0000 mg | ORAL_TABLET | Freq: Two times a day (BID) | ORAL | Status: DC
  Administered 2021-02-13 – 2021-03-05 (×33): 25 mg via ORAL
  Filled 2021-02-13: qty 1
  Filled 2021-02-13: qty 2
  Filled 2021-02-13: qty 1
  Filled 2021-02-13: qty 2
  Filled 2021-02-13 (×3): qty 1
  Filled 2021-02-13 (×2): qty 2
  Filled 2021-02-13 (×3): qty 1
  Filled 2021-02-13 (×4): qty 2
  Filled 2021-02-13 (×4): qty 1
  Filled 2021-02-13 (×2): qty 2
  Filled 2021-02-13 (×2): qty 1
  Filled 2021-02-13 (×3): qty 2
  Filled 2021-02-13: qty 1
  Filled 2021-02-13: qty 2
  Filled 2021-02-13: qty 1
  Filled 2021-02-13 (×2): qty 2
  Filled 2021-02-13 (×2): qty 1

## 2021-02-13 NOTE — Progress Notes (Signed)
Progress Note    [name]  [id number] DOB: Jul 30, 1985  DOA: 02/08/2021 PCP: Gildardo Pounds, NP    Brief Narrative:   [name] is an 35 y.o. male with medical history significant of ESRD on HD, diabetes mellitus off insulin, hypertension, hyperlipidemia, legally blind presented to hospital with syncope versus seizure.  He was thought to have seizures from elevated blood glucose levels.  He used to live with his aunt but she is unable to take him back home.  TOC on board and awaiting for disposition plan..  Assessment/Plan:   Principal Problem:   Seizure-like activity (HCC) Active Problems:   Essential hypertension   DM2 (diabetes mellitus, type 2) (HCC)   Anemia of chronic kidney failure   Diabetic foot ulcer ([place])   Syncope   DKA (diabetic ketoacidosis) ([place])   Acute metabolic encephalopathy  Resolved at this time.  Thought to be initially to be secondary to elevated blood glucose level/seizure disorder.  EEG with questionable dysfunction in the left temporal region.  MRI was negative so unlikely seizures was noted to be from elevated blood glucose levels.  2D echocardiogram performed showed EF of 45% which will need outpatient follow-up.    Hyperosmolar state with history of type 2 diabetes, legally blind.   Improved.  Has been transitioned to subcu insulin.  Was not on insulin as outpatient.  Diabetic coordinator on board.  Insulin Regimen has been adjusted.  Latest POC glucose of 192  Essential hypertension, Continue Coreg, torsemide, losartan.  Blood pressure seems to be still accelerated.  We will increase Coreg dose to 5 mg twice daily  Acute on chronic diastolic CHF  Improved after hemodialysis.  Compensated at this time.   COVID-19 infection- incidental Chest x-ray with fluid overload initially.  No fever or other signs of infection.Given significant comorbidities at baseline, ER decided to give monoclonal antibody infusion x1 by admitter.  No chest  pain or shortness of breath.   Hyperkalemia Resolved.  On hemodialysis.  Latest potassium of 4.2   Right toes ulcer -continue doxycycline as per podiatry, no fever or leukocytosis.   ESRD on HD Monday Wednesday Friday. Continue hemodialysis as per nephrology.   Code Status: Full Code.   DispositionPlan: Status is: Inpatient  Remains inpatient appropriate because:Inpatient level of care appropriate due to severity of illness disposition issue.  Dispo: The patient is from: Home              Anticipated d/c is to: Undetermined at this time.  No special PT needs.  Patient does not have a place to go and he is unsafe discharge due to blindness and need for medical intervention..              Patient currently is  medically stable to d/c.   Difficult to place patient No  Medical Consultants:   Nephrology Neurology  Subjective:  Today, patient was seen and examined at bedside.  Denies any interval complaints.  Denies any shortness of breath cough fever chills.  Objective:    Vitals:   02/12/21 1950 02/12/21 2305 02/13/21 0419 02/13/21 0849  BP: (!) 143/96 (!) 160/99 (!) 157/101 (!) 155/116  Pulse: 83 80 77 78  Resp: '18 17 18   '$ Temp: 98.2 F (36.8 C) 98.7 F (37.1 C) 98.5 F (36.9 C) 98.2 F (36.8 C)  TempSrc: Oral Oral Oral Oral  SpO2: 100% 98% 100% 99%  Weight:      Height:  No intake or output data in the 24 hours ending 02/13/21 1259  Filed Weights   02/12/21 0500 02/12/21 0810 02/12/21 1225  Weight: 92.5 kg 92.5 kg 89 kg    Physical exam  General: Overweight, not in obvious distress HENT:   No scleral pallor or icterus noted. Oral mucosa is moist.  Legally blind Chest: Diminished breath sounds bilaterally CVS: S1 &S2 heard. No murmur.  Regular rate and rhythm. Abdomen: Soft, nontender, nondistended.  Bowel sounds are heard.   Extremities: No cyanosis, clubbing or edema.  Peripheral pulses are palpable.  Left upper extremity AV fistula.  Right toe  ulcers Psych: Alert, awake and oriented, flat affect CNS:  No cranial nerve deficits.  Power equal in all extremities.   Skin: Warm and dry  Data Reviewed:   I have personally reviewed the following labs and imaging studies  Labs:  Basic Metabolic Panel: Recent Labs  Lab 02/09/21 0256 02/10/21 0357 02/11/21 0210 02/12/21 0509 02/13/21 0332  NA 131*  130* 130* 132* 126* 129*  K 4.9  4.9 4.8 4.2 4.3 4.2  CL 91*  92* 90* 94* 88* 93*  CO2 '23  22 22 26 22 27  '$ GLUCOSE 153*  154* 204* 357* 266* 260*  BUN 73*  75* 90* 52* 63* 43*  CREATININE 13.43*  13.58* 15.01* 10.07* 11.37* 8.25*  CALCIUM 8.5*  8.4* 8.0* 7.6* 7.9* 7.8*  MG 2.4 2.5* 2.0 2.4 2.1  PHOS 7.1* 7.5* 5.4* 5.8* 4.5    GFR Estimated Creatinine Clearance: 13.6 mL/min (A) (by C-G formula based on SCr of 8.25 mg/dL (H)). Liver Function Tests: Recent Labs  Lab 02/09/21 0256 02/10/21 0357 02/11/21 0210 02/12/21 0509 02/13/21 0332  AST 19 13* 15 12* 16  ALT 36 '25 25 20 21  '$ ALKPHOS 134* 127* 143* 130* 120  BILITOT 1.2 1.0 1.1 0.7 0.8  PROT 6.7 6.2* 6.5 6.1* 5.8*  ALBUMIN 3.1* 2.8* 2.8* 2.7* 2.5*    No results for input(s): LIPASE, AMYLASE in the last 168 hours. No results for input(s): AMMONIA in the last 168 hours. Coagulation profile No results for input(s): INR, PROTIME in the last 168 hours.  CBC: Recent Labs  Lab 02/09/21 0256 02/10/21 0357 02/11/21 0210 02/12/21 0509 02/13/21 0332  WBC 10.8* 7.8 6.6 6.2 5.7  NEUTROABS 9.2* 6.1 5.2 4.5 3.8  HGB 9.7* 9.1* 10.1* 9.9* 10.0*  HCT 28.7* 27.9* 32.8* 31.8* 31.5*  MCV 79.7* 81.1 83.5 83.0 82.5  PLT 367 356 382 348 334    Cardiac Enzymes: No results for input(s): CKTOTAL, CKMB, CKMBINDEX, TROPONINI in the last 168 hours. BNP (last 3 results) No results for input(s): PROBNP in the last 8760 hours. CBG: Recent Labs  Lab 02/12/21 0631 02/12/21 1250 02/12/21 1652 02/12/21 2303 02/13/21 0622  GLUCAP 265* 183* 323* 351* 192*     D-Dimer: Recent Labs    02/12/21 0509 02/13/21 0332  DDIMER 0.74* 1.06*    Hgb A1c: No results for input(s): HGBA1C in the last 72 hours.  Lipid Profile: No results for input(s): CHOL, HDL, LDLCALC, TRIG, CHOLHDL, LDLDIRECT in the last 72 hours. Thyroid function studies: No results for input(s): TSH, T4TOTAL, T3FREE, THYROIDAB in the last 72 hours.  Invalid input(s): FREET3 Anemia work up: Recent Labs    02/12/21 0509 02/13/21 0332  FERRITIN 486* 448*    Sepsis Labs: Recent Labs  Lab 02/08/21 1425 02/08/21 1630 02/08/21 1905 02/09/21 0256 02/10/21 0357 02/11/21 0210 02/12/21 0509 02/13/21 0332  PROCALCITON  --   --  1.10  --   --  1.33 0.84  --   WBC  --   --   --    < > 7.8 6.6 6.2 5.7  LATICACIDVEN 3.9* 2.6*  --   --   --   --   --   --    < > = values in this interval not displayed.     Microbiology Recent Results (from the past 240 hour(s))  Resp Panel by RT-PCR (Flu A&B, Covid) Nasopharyngeal Swab     Status: Abnormal   Collection Time: 02/08/21  2:08 PM   Specimen: Nasopharyngeal Swab; Nasopharyngeal(NP) swabs in vial transport medium  Result Value Ref Range Status   SARS Coronavirus 2 by RT PCR POSITIVE (A) NEGATIVE Final    Comment: RESULT CALLED TO, READ BACK BY AND VERIFIED WITH: B,WRIGHT '@1613'$  02/08/21 EB (NOTE) SARS-CoV-2 target nucleic acids are DETECTED.  The SARS-CoV-2 RNA is generally detectable in upper respiratory specimens during the acute phase of infection. Positive results are indicative of the presence of the identified virus, but do not rule out bacterial infection or co-infection with other pathogens not detected by the test. Clinical correlation with patient history and other diagnostic information is necessary to determine patient infection status. The expected result is Negative.  Fact Sheet for Patients: EntrepreneurPulse.com.au  Fact Sheet for Healthcare  Providers: IncredibleEmployment.be  This test is not yet approved or cleared by the [place] FDA and  has been authorized for detection and/or diagnosis of SARS-CoV-2 by FDA under an Emergency Use Authorization (EUA).  This EUA will remain in effect (meaning this test can be used) f or the duration of  the COVID-19 declaration under Section 564(b)(1) of the Act, 21 U.S.C. section 360bbb-3(b)(1), unless the authorization is terminated or revoked sooner.     Influenza A by PCR NEGATIVE NEGATIVE Final   Influenza B by PCR NEGATIVE NEGATIVE Final    Comment: (NOTE) The Xpert Xpress SARS-CoV-2/FLU/RSV plus assay is intended as an aid in the diagnosis of influenza from Nasopharyngeal swab specimens and should not be used as a sole basis for treatment. Nasal washings and aspirates are unacceptable for Xpert Xpress SARS-CoV-2/FLU/RSV testing.  Fact Sheet for Patients: EntrepreneurPulse.com.au  Fact Sheet for Healthcare Providers: IncredibleEmployment.be  This test is not yet approved or cleared by the [place] FDA and has been authorized for detection and/or diagnosis of SARS-CoV-2 by FDA under an Emergency Use Authorization (EUA). This EUA will remain in effect (meaning this test can be used) for the duration of the COVID-19 declaration under Section 564(b)(1) of the Act, 21 U.S.C. section 360bbb-3(b)(1), unless the authorization is terminated or revoked.  Performed at [facility], [place] [address], [place], [place] [address]     Procedures and diagnostic studies:  No results found.  Medications:    atorvastatin  40 mg Oral Daily   carvedilol  12.5 mg Oral BID WC   Chlorhexidine Gluconate Cloth  6 each Topical Q0600   [START ON 02/14/2021] darbepoetin (ARANESP) injection - DIALYSIS  60 mcg Intravenous Q Sat-HD   doxercalciferol  5 mcg Intravenous Q M,W,F-HD   doxycycline  100 mg Oral Q12H   feeding  supplement ([name] CARB STEADY)  237 mL Oral TID BM   heparin  5,000 Units Subcutaneous Q12H   imipramine  25 mg Oral QHS   insulin aspart  0-5 Units Subcutaneous QHS   insulin aspart  0-6 Units Subcutaneous TID WC   insulin aspart  2 Units Subcutaneous  TID WC   insulin glargine-yfgn  11 Units Subcutaneous QHS   losartan  50 mg Oral QHS   multivitamin  1 tablet Oral QHS   sevelamer carbonate  1,600 mg Oral TID WC   sodium chloride flush  3 mL Intravenous Q12H   torsemide  40 mg Oral Q T,Th,S,Su   Continuous Infusions:  sodium chloride     promethazine (PHENERGAN) injection (IM or IVPB)       LOS: 5 days   Flora Lipps, MD [facility] 02/13/2021, 12:59 PM

## 2021-02-13 NOTE — Progress Notes (Signed)
Inpatient Diabetes Program Recommendations  AACE/ADA: New Consensus Statement on Inpatient Glycemic Control  Target Ranges:  Prepandial:   less than 140 mg/dL      Peak postprandial:   less than 180 mg/dL (1-2 hours)      Critically ill patients:  140 - 180 mg/dL   Results for ARCH, PLANT (MRN [id number]) as of 02/13/2021 08:27  Ref. Range 02/12/2021 06:31 02/12/2021 12:50 02/12/2021 16:52 02/12/2021 23:03 02/13/2021 06:22  Glucose-Capillary Latest Ref Range: 70 - 99 mg/dL 265 (H) 183 (H) 323 (H) 351 (H) 192 (H)    Review of Glycemic Control  Diabetes history: DM2 Outpatient Diabetes medications: Levemir 10 units daily (not taken in at least 4 months) Current orders for Inpatient glycemic control: Semglee 11 units QHS, Novlog 0-6 units TID with meals, Novolog 0-5 units QHS, Novolog 2 units TID with meals   Inpatient Diabetes Program Recommendations:     Insulin: Please consider increasing Semglee to 13 units QHS and meal coverage to Novolog 4 units TID with meals for meal coverage.  Thanks, Barnie Alderman, RN, MSN, CDE Diabetes Coordinator Inpatient Diabetes Program [phone] (Team Pager from 8am to 5pm)

## 2021-02-13 NOTE — TOC Progression Note (Addendum)
Transition of Care [facility]) - Progression Note    Patient Details  Name: [name] MRN: [id number] Date of Birth: 27-Aug-1985  Transition of [facility]) CM/SW Contact  Pollie Friar, RN Phone Number: 02/13/2021, 4:13 PM  Clinical Narrative:    CM spoke to Ms Shanda Bumps at [facility] [phone]). She is going to review pts information and update CM next week.  ToC following.   Expected Discharge Plan: [place] Barriers to Discharge: Continued Medical Work up  Expected Discharge Plan and Services Expected Discharge Plan: [place]   Discharge Planning Services: CM Consult Post Acute Care Choice: [place] Living arrangements for the past 2 months: Single Family Home                                       Social Determinants of Health (SDOH) Interventions    Readmission Risk Interventions Readmission Risk Prevention Plan 11/27/2020 02/29/2020  Transportation Screening Complete Complete  Home Care Screening - Complete  Medication Review ([place]) Complete -  PCP or Specialist appointment within 3-5 days of discharge Complete -  [place] or Home Care Consult Complete -  SW Recovery Care/Counseling Consult Complete -  Palliative Care Screening Not Applicable -  [place] Not Applicable -  Some recent data might be hidden

## 2021-02-13 NOTE — Progress Notes (Signed)
Patient ID: DAXTYN GLOWINSKI, male   DOB: 1986/05/14, 35 y.o.   MRN: [id number] S: No events overnight O:BP (!) 155/116 (BP Location: Right Arm)   Pulse 78   Temp 98.2 F (36.8 C) (Oral)   Resp 18   Ht 6' (1.829 m)   Wt 89 kg   SpO2 99%   BMI 26.61 kg/m   Intake/Output Summary (Last 24 hours) at 02/13/2021 [id number] Last data filed at 02/12/2021 1225 Gross per 24 hour  Intake --  Output 3500 ml  Net -3500 ml   Intake/Output: I/O last 3 completed shifts: In: -  Out: 3500 [Other:3500]  Intake/Output this shift:  No intake/output data recorded. Weight change: 0 kg  Physical exam: unable to complete due to COVID + status.  In order to preserve PPE equipment and to minimize exposure to providers.  Notes from other caregivers reviewed  Recent Labs  Lab 02/08/21 1351 02/08/21 1401 02/08/21 1905 02/08/21 2317 02/09/21 0256 02/10/21 0357 02/11/21 0210 02/12/21 0509 02/13/21 0332  NA 124*   < > 128* 133* 131*  130* 130* 132* 126* 129*  K 5.7*   < > 5.0 4.4 4.9  4.9 4.8 4.2 4.3 4.2  CL 86*   < > 90* 92* 91*  92* 90* 94* 88* 93*  CO2 20*   < > '22 22 23  22 22 26 22 27  '$ GLUCOSE 789*   < > 500* 99 153*  154* 204* 357* 266* 260*  BUN 69*   < > 71* 72* 73*  75* 90* 52* 63* 43*  CREATININE 13.19*   < > 13.13* 13.06* 13.43*  13.58* 15.01* 10.07* 11.37* 8.25*  ALBUMIN 3.3*  --   --   --  3.1* 2.8* 2.8* 2.7* 2.5*  CALCIUM 8.2*   < > 8.5* 8.7* 8.5*  8.4* 8.0* 7.6* 7.9* 7.8*  PHOS  --   --   --   --  7.1* 7.5* 5.4* 5.8* 4.5  AST 26  --   --   --  19 13* 15 12* 16  ALT 41  --   --   --  36 '25 25 20 21   '$ < > = values in this interval not displayed.   Liver Function Tests: Recent Labs  Lab 02/11/21 0210 02/12/21 0509 02/13/21 0332  AST 15 12* 16  ALT '25 20 21  '$ ALKPHOS 143* 130* 120  BILITOT 1.1 0.7 0.8  PROT 6.5 6.1* 5.8*  ALBUMIN 2.8* 2.7* 2.5*   No results for input(s): LIPASE, AMYLASE in the last 168 hours. No results for input(s): AMMONIA in the last 168  hours. CBC: Recent Labs  Lab 02/09/21 0256 02/10/21 0357 02/11/21 0210 02/12/21 0509 02/13/21 0332  WBC 10.8* 7.8 6.6 6.2 5.7  NEUTROABS 9.2* 6.1 5.2 4.5 3.8  HGB 9.7* 9.1* 10.1* 9.9* 10.0*  HCT 28.7* 27.9* 32.8* 31.8* 31.5*  MCV 79.7* 81.1 83.5 83.0 82.5  PLT 367 356 382 348 334   Cardiac Enzymes: No results for input(s): CKTOTAL, CKMB, CKMBINDEX, TROPONINI in the last 168 hours. CBG: Recent Labs  Lab 02/12/21 0631 02/12/21 1250 02/12/21 1652 02/12/21 2303 02/13/21 0622  GLUCAP 265* 183* 323* 351* 192*    Iron Studies:  Recent Labs    02/13/21 0332  FERRITIN 448*   Studies/Results: No results found.  atorvastatin  40 mg Oral Daily   carvedilol  12.5 mg Oral BID WC   Chlorhexidine Gluconate Cloth  6 each Topical Q0600   [START ON  02/14/2021] darbepoetin (ARANESP) injection - DIALYSIS  60 mcg Intravenous Q Sat-HD   doxercalciferol  5 mcg Intravenous Q M,W,F-HD   doxycycline  100 mg Oral Q12H   feeding supplement ([name] CARB STEADY)  237 mL Oral TID BM   heparin  5,000 Units Subcutaneous Q12H   imipramine  25 mg Oral QHS   insulin aspart  0-5 Units Subcutaneous QHS   insulin aspart  0-6 Units Subcutaneous TID WC   insulin aspart  2 Units Subcutaneous TID WC   insulin glargine-yfgn  11 Units Subcutaneous QHS   losartan  50 mg Oral QHS   multivitamin  1 tablet Oral QHS   sevelamer carbonate  1,600 mg Oral TID WC   sodium chloride flush  3 mL Intravenous Q12H   torsemide  40 mg Oral Q T,Th,S,Su    BMET    Component Value Date/Time   NA 129 (L) 02/13/2021 0332   NA 143 09/16/2020 1356   K 4.2 02/13/2021 0332   CL 93 (L) 02/13/2021 0332   CO2 27 02/13/2021 0332   GLUCOSE 260 (H) 02/13/2021 0332   BUN 43 (H) 02/13/2021 0332   BUN 40 (H) 09/16/2020 1356   CREATININE 8.25 (H) 02/13/2021 0332   CALCIUM 7.8 (L) 02/13/2021 0332   CALCIUM 7.2 (L) 08/08/2020 0534   GFRNONAA 8 (L) 02/13/2021 0332   GFRAA 19 (L) 03/10/2020 1410   CBC    Component Value  Date/Time   WBC 5.7 02/13/2021 0332   RBC 3.82 (L) 02/13/2021 0332   HGB 10.0 (L) 02/13/2021 0332   HGB 8.1 (L) 09/16/2020 1356   HCT 31.5 (L) 02/13/2021 0332   HCT 26.5 (L) 09/16/2020 1356   PLT 334 02/13/2021 0332   PLT 272 09/16/2020 1356   MCV 82.5 02/13/2021 0332   MCV 86 09/16/2020 1356   MCH 26.2 02/13/2021 0332   MCHC 31.7 02/13/2021 0332   RDW 18.6 (H) 02/13/2021 0332   RDW 15.3 09/16/2020 1356   LYMPHSABS 1.2 02/13/2021 0332   MONOABS 0.6 02/13/2021 0332   EOSABS 0.1 02/13/2021 0332   BASOSABS 0.0 02/13/2021 0332    Dialysis Orders: [facility] MWF 4 hrs 180NRe 450/800 86.5 kg 2.0 K/2.0 Ca L AVF -Heparin-none -[name] 5 mcg IV TIW -Venofer 100 mcg IV X 10 doses 3/7 doses given last dose 02/04/21 -Mircera 225 mcg IV q 2 weeks (last dose 02/02/2021)     Assessment/ Plan: Pt is a 35 y.o. yo male with ESRD who was admitted on 02/08/2021 with hyperglycemia and questionable sz     Assessment/Plan:  HHS: Started on insulin  per primary with improvement in BS and K+. Per primary AMS: Thought to be related to elevated glucose +/- seizure. CT of head without acute changes/MRI neg as well. -  neuro involved no anti sz meds req COVID 19- Per primary-  on minimal O2, no medical treatment required Hypertensive Emergency: Medications and EDW adjusted at OP HD center last week. improved. HD today-continue to lower volume as tolerated. Coreg 12.5 BID-   cozaar 50 Torsemide 20 mg 2 tabs on non HD days.  BP is pretty good here-  suspect noncom with meds as OP Hyperkalemia-resolved with correction of BS. R toe ulcer: On Doxycycline, WC consult per primary  ESRD -  MWF-missed HD 07/29 D/T transportation issues. No heparin. 2.0 K bath.  Treatment got postponed Monday to Tuesday and has been off schedule.  Had HD yesterday and plan again tomorrow and get back on outpatient schedule  by Monday.  Hypertension/volume  - remains above edw by 3 kg.  Will cont to UF and try to get to edw tomorrow.    Anemia  - HGB 9.7--9.1. ESA here weekly   Metabolic bone disease -  C Ca OK, PO4 elevated. Increase Renvela to 2 tabs PO TID AC-  phos better. Continue VDRA.   Nutrition -Albumin 2.5. Renal Carb mod diet, add nepro, renal vits. Disposition -  will likely need placement since his aunt can no longer care for him.  SW/CM involved and speaking with family and pt.   Donetta Potts, MD [employer] [phone]

## 2021-02-14 DIAGNOSIS — R569 Unspecified convulsions: Secondary | ICD-10-CM | POA: Diagnosis not present

## 2021-02-14 LAB — BASIC METABOLIC PANEL
Anion gap: 11 (ref 5–15)
BUN: 62 mg/dL — ABNORMAL HIGH (ref 6–20)
CO2: 22 mmol/L (ref 22–32)
Calcium: 7.7 mg/dL — ABNORMAL LOW (ref 8.9–10.3)
Chloride: 90 mmol/L — ABNORMAL LOW (ref 98–111)
Creatinine, Ser: 9.72 mg/dL — ABNORMAL HIGH (ref 0.61–1.24)
GFR, Estimated: 7 mL/min — ABNORMAL LOW (ref >60)
Glucose, Bld: 391 mg/dL — ABNORMAL HIGH (ref 70–99)
Potassium: 4.4 mmol/L (ref 3.5–5.1)
Sodium: 123 mmol/L — ABNORMAL LOW (ref 135–145)

## 2021-02-14 LAB — GLUCOSE, CAPILLARY
Glucose-Capillary: 437 mg/dL — ABNORMAL HIGH (ref 70–99)
Glucose-Capillary: 165 mg/dL — ABNORMAL HIGH (ref 70–99)
Glucose-Capillary: 359 mg/dL — ABNORMAL HIGH (ref 70–99)

## 2021-02-14 MED ORDER — LABETALOL HCL 5 MG/ML IV SOLN
10.0000 mg | Freq: Once | INTRAVENOUS | Status: DC
  Filled 2021-02-14: qty 4

## 2021-02-14 MED ORDER — DARBEPOETIN ALFA 60 MCG/0.3ML IJ SOSY
PREFILLED_SYRINGE | INTRAMUSCULAR | Status: AC
  Administered 2021-02-14: 60 ug via INTRAVENOUS
  Filled 2021-02-14: qty 0.3

## 2021-02-14 NOTE — Progress Notes (Signed)
Patient ID: [name], male   DOB: [birth year], 35 y.o.   MRN: [id number]  PROGRESS NOTE    [name]  [id number] DOB: 06-22-1986 DOA: 02/08/2021 PCP: Gildardo Pounds, NP    Brief Narrative:  [name] is an 35 y.o. male with medical history significant of ESRD on HD, diabetes mellitus off insulin, hypertension, hyperlipidemia, legally blind presented to hospital with syncope versus seizure.  He was thought to have seizures from elevated blood glucose levels.  He used to live with his aunt but she is unable to take him back home.  TOC on board and awaiting for disposition plan.   Assessment & Plan:   Principal Problem:   Seizure-like activity ([place]) Active Problems:   Essential hypertension   DM2 (diabetes mellitus, type 2) (HCC)   Anemia of chronic kidney failure   Diabetic foot ulcer ([place])   Syncope   DKA (diabetic ketoacidosis) ([place])  Acute metabolic encephalopathy  Resolved at this time.  Thought to be initially to be secondary to elevated blood glucose level/seizure disorder.  EEG with questionable dysfunction in the left temporal region.  MRI was negative so possibly seizures from elevated blood glucose levels.  2D echocardiogram performed showed EF of 45% which will need outpatient follow-up.     Hyperosmolar state with history of type 2 diabetes, legally blind.   Improved.  Has been transitioned to subcu insulin.  Was not on insulin as outpatient.  Diabetic coordinator on board.  Insulin Regimen has been adjusted.  Latest POC glucose of 165   Essential hypertension, Continue Coreg, torsemide, losartan.  Have increased Coreg dose to 5 mg twice daily, had missed some dialysis and likely some component of fluid overload we will see how he does post fluid removal.   Acute on chronic diastolic CHF Improved after hemodialysis.  Compensated at this time.   COVID-19 infection- incidental Chest x-ray with fluid overload initially.  No fever or other signs of  infection.Given significant comorbidities at baseline, ER decided to give monoclonal antibody infusion x1 by admitter.  No chest pain or shortness of breath.   Hyperkalemia Resolved K today is 4.4   Right toes ulcer -continue doxycycline as per podiatry, no fever or leukocytosis.   ESRD on HD Monday Wednesday Friday. Continue hemodialysis as per nephrology.  Pressure in right ear For flush today  DVT prophylaxis: Heparin SQ Code Status: Full code  Family Communication: Patient at bedside Disposition Plan: Patient remains inpatient due to unsafe discharge plan   Consultants:  Nephrology Neurology  Procedures: EEG Echocardiogram Hemodialysis  Antimicrobials: Anti-infectives (From admission, onward)    Start     Dose/Rate Route Frequency Ordered Stop   02/08/21 2200  doxycycline (VIBRA-TABS) tablet 100 mg        100 mg Oral Every 12 hours 02/08/21 1718          Subjective: Patient reports feeling full in his right ear today he thinks is full of wax.  He is otherwise feeling well  Objective: Vitals:   02/14/21 0021 02/14/21 0410 02/14/21 0725 02/14/21 1300  BP: (!) 156/84 (!) 141/87 (!) 162/103 (!) 162/114  Pulse: 79 76 70 71  Resp: '16 16 16 16  '$ Temp: 98.3 F (36.8 C) 98.1 F (36.7 C) 98.2 F (36.8 C) 97.9 F (36.6 C)  TempSrc: Oral  Oral Oral  SpO2: 100% 100% 100% 100%  Weight:      Height:        Intake/Output Summary (Last 24  hours) at 02/14/2021 1422 Last data filed at 02/13/2021 1800 Gross per 24 hour  Intake 240 ml  Output --  Net 240 ml   Filed Weights   02/12/21 0500 02/12/21 0810 02/12/21 1225  Weight: 92.5 kg 92.5 kg 89 kg    Examination:  General exam: Appears calm and comfortable  Respiratory system: Clear to auscultation. Respiratory effort normal. Cardiovascular system: S1 & S2 heard, RRR.  Gastrointestinal system: Abdomen is nondistended, soft and nontender.  Central nervous system: Alert and oriented. No focal neurological  deficits. Extremities: Symmetric  Skin: No rashes Psychiatry: Judgement and insight appear normal. Mood & affect appropriate.     Data Reviewed: I have personally reviewed following labs and imaging studies  CBC: Recent Labs  Lab 02/09/21 0256 02/10/21 0357 02/11/21 0210 02/12/21 0509 02/13/21 0332  WBC 10.8* 7.8 6.6 6.2 5.7  NEUTROABS 9.2* 6.1 5.2 4.5 3.8  HGB 9.7* 9.1* 10.1* 9.9* 10.0*  HCT 28.7* 27.9* 32.8* 31.8* 31.5*  MCV 79.7* 81.1 83.5 83.0 82.5  PLT 367 356 382 348 [id number]   Basic Metabolic Panel: Recent Labs  Lab 02/09/21 0256 02/10/21 0357 02/11/21 0210 02/12/21 0509 02/13/21 0332 02/14/21 0317  NA 131*  130* 130* 132* 126* 129* 123*  K 4.9  4.9 4.8 4.2 4.3 4.2 4.4  CL 91*  92* 90* 94* 88* 93* 90*  CO2 '23  22 22 26 22 27 22  '$ GLUCOSE 153*  154* 204* 357* 266* 260* 391*  BUN 73*  75* 90* 52* 63* 43* 62*  CREATININE 13.43*  13.58* 15.01* 10.07* 11.37* 8.25* 9.72*  CALCIUM 8.5*  8.4* 8.0* 7.6* 7.9* 7.8* 7.7*  MG 2.4 2.5* 2.0 2.4 2.1  --   PHOS 7.1* 7.5* 5.4* 5.8* 4.5  --    GFR: Estimated Creatinine Clearance: 11.5 mL/min (A) (by C-G formula based on SCr of 9.72 mg/dL (H)). Liver Function Tests: Recent Labs  Lab 02/09/21 0256 02/10/21 0357 02/11/21 0210 02/12/21 0509 02/13/21 0332  AST 19 13* 15 12* 16  ALT 36 '25 25 20 21  '$ ALKPHOS 134* 127* 143* 130* 120  BILITOT 1.2 1.0 1.1 0.7 0.8  PROT 6.7 6.2* 6.5 6.1* 5.8*  ALBUMIN 3.1* 2.8* 2.8* 2.7* 2.5*    CBG: Recent Labs  Lab 02/13/21 1317 02/13/21 1614 02/13/21 2142 02/14/21 0610 02/14/21 1257  GLUCAP 154* 154* 320* 437* 165*   Anemia Panel: Recent Labs    02/12/21 0509 02/13/21 0332  FERRITIN 486* 448*   Sepsis Labs: Recent Labs  Lab 02/08/21 1425 02/08/21 1630 02/08/21 1905 02/11/21 0210 02/12/21 0509  PROCALCITON  --   --  1.10 1.33 0.84  LATICACIDVEN 3.9* 2.6*  --   --   --     Recent Results (from the past 240 hour(s))  Resp Panel by RT-PCR (Flu A&B, Covid)  Nasopharyngeal Swab     Status: Abnormal   Collection Time: 02/08/21  2:08 PM   Specimen: Nasopharyngeal Swab; Nasopharyngeal(NP) swabs in vial transport medium  Result Value Ref Range Status   SARS Coronavirus 2 by RT PCR POSITIVE (A) NEGATIVE Final    Comment: RESULT CALLED TO, READ BACK BY AND VERIFIED WITH: B,WRIGHT '@1613'$  02/08/21 EB (NOTE) SARS-CoV-2 target nucleic acids are DETECTED.  The SARS-CoV-2 RNA is generally detectable in upper respiratory specimens during the acute phase of infection. Positive results are indicative of the presence of the identified virus, but do not rule out bacterial infection or co-infection with other pathogens not detected by the test. Clinical correlation  with patient history and other diagnostic information is necessary to determine patient infection status. The expected result is Negative.  Fact Sheet for Patients: EntrepreneurPulse.com.au  Fact Sheet for Healthcare Providers: IncredibleEmployment.be  This test is not yet approved or cleared by the [place] FDA and  has been authorized for detection and/or diagnosis of SARS-CoV-2 by FDA under an Emergency Use Authorization (EUA).  This EUA will remain in effect (meaning this test can be used) f or the duration of  the COVID-19 declaration under Section 564(b)(1) of the Act, 21 U.S.C. section 360bbb-3(b)(1), unless the authorization is terminated or revoked sooner.     Influenza A by PCR NEGATIVE NEGATIVE Final   Influenza B by PCR NEGATIVE NEGATIVE Final    Comment: (NOTE) The Xpert Xpress SARS-CoV-2/FLU/RSV plus assay is intended as an aid in the diagnosis of influenza from Nasopharyngeal swab specimens and should not be used as a sole basis for treatment. Nasal washings and aspirates are unacceptable for Xpert Xpress SARS-CoV-2/FLU/RSV testing.  Fact Sheet for Patients: EntrepreneurPulse.com.au  Fact Sheet for Healthcare  Providers: IncredibleEmployment.be  This test is not yet approved or cleared by the [place] FDA and has been authorized for detection and/or diagnosis of SARS-CoV-2 by FDA under an Emergency Use Authorization (EUA). This EUA will remain in effect (meaning this test can be used) for the duration of the COVID-19 declaration under Section 564(b)(1) of the Act, 21 U.S.C. section 360bbb-3(b)(1), unless the authorization is terminated or revoked.  Performed at [facility], [place] [address] [place], [place] [address]       Radiology Studies: No results found.   Scheduled Meds:  atorvastatin  40 mg Oral Daily   carvedilol  25 mg Oral BID WC   Chlorhexidine Gluconate Cloth  6 each Topical Q0600   Darbepoetin Alfa       darbepoetin (ARANESP) injection - DIALYSIS  60 mcg Intravenous Q Sat-HD   doxercalciferol  5 mcg Intravenous Q M,W,F-HD   doxycycline  100 mg Oral Q12H   feeding supplement ([name] CARB STEADY)  237 mL Oral TID BM   heparin  5,000 Units Subcutaneous Q12H   imipramine  25 mg Oral QHS   insulin aspart  0-5 Units Subcutaneous QHS   insulin aspart  0-6 Units Subcutaneous TID WC   insulin aspart  2 Units Subcutaneous TID WC   insulin glargine-yfgn  11 Units Subcutaneous QHS   losartan  50 mg Oral QHS   multivitamin  1 tablet Oral QHS   sevelamer carbonate  1,600 mg Oral TID WC   sodium chloride flush  3 mL Intravenous Q12H   torsemide  40 mg Oral Q T,Th,S,Su   Continuous Infusions:  sodium chloride     promethazine (PHENERGAN) injection (IM or IVPB)       LOS: 6 days    Donnamae Jude, MD 02/14/2021 2:22 PM [phone] [facility] If 7PM-7AM, please contact night-coverage 02/14/2021, 2:22 PM

## 2021-02-14 NOTE — Progress Notes (Signed)
Patient ID: [name], male   DOB: [birth year], 35 y.o.   MRN: [id number] S: No events overnight O:BP (!) 162/103 (BP Location: Right Arm)   Pulse 70   Temp 98.2 F (36.8 C) (Oral)   Resp 16   Ht 6' (1.829 m)   Wt 89 kg   SpO2 100%   BMI 26.61 kg/m   Intake/Output Summary (Last 24 hours) at 02/14/2021 1000 Last data filed at 02/13/2021 1800 Gross per 24 hour  Intake 480 ml  Output --  Net 480 ml   Intake/Output: I/O last 3 completed shifts: In: 720 [P.O.:720] Out: -   Intake/Output this shift:  No intake/output data recorded. Weight change:  Physical exam: unable to complete due to COVID + status.  In order to preserve PPE equipment and to minimize exposure to providers.  Notes from other caregivers reviewed   Recent Labs  Lab 02/08/21 1351 02/08/21 1401 02/08/21 2317 02/09/21 0256 02/10/21 0357 02/11/21 0210 02/12/21 0509 02/13/21 0332 02/14/21 0317  NA 124*   < > 133* 131*  130* 130* 132* 126* 129* 123*  K 5.7*   < > 4.4 4.9  4.9 4.8 4.2 4.3 4.2 4.4  CL 86*   < > 92* 91*  92* 90* 94* 88* 93* 90*  CO2 20*   < > '22 23  22 22 26 22 27 22  '$ GLUCOSE 789*   < > 99 153*  154* 204* 357* 266* 260* 391*  BUN 69*   < > 72* 73*  75* 90* 52* 63* 43* 62*  CREATININE 13.19*   < > 13.06* 13.43*  13.58* 15.01* 10.07* 11.37* 8.25* 9.72*  ALBUMIN 3.3*  --   --  3.1* 2.8* 2.8* 2.7* 2.5*  --   CALCIUM 8.2*   < > 8.7* 8.5*  8.4* 8.0* 7.6* 7.9* 7.8* 7.7*  PHOS  --   --   --  7.1* 7.5* 5.4* 5.8* 4.5  --   AST 26  --   --  19 13* 15 12* 16  --   ALT 41  --   --  36 '25 25 20 21  '$ --    < > = values in this interval not displayed.   Liver Function Tests: Recent Labs  Lab 02/11/21 0210 02/12/21 0509 02/13/21 0332  AST 15 12* 16  ALT '25 20 21  '$ ALKPHOS 143* 130* 120  BILITOT 1.1 0.7 0.8  PROT 6.5 6.1* 5.8*  ALBUMIN 2.8* 2.7* 2.5*   No results for input(s): LIPASE, AMYLASE in the last 168 hours. No results for input(s): AMMONIA in the last 168 hours. CBC: Recent Labs   Lab 02/09/21 0256 02/10/21 0357 02/11/21 0210 02/12/21 0509 02/13/21 0332  WBC 10.8* 7.8 6.6 6.2 5.7  NEUTROABS 9.2* 6.1 5.2 4.5 3.8  HGB 9.7* 9.1* 10.1* 9.9* 10.0*  HCT 28.7* 27.9* 32.8* 31.8* 31.5*  MCV 79.7* 81.1 83.5 83.0 82.5  PLT 367 356 382 348 334   Cardiac Enzymes: No results for input(s): CKTOTAL, CKMB, CKMBINDEX, TROPONINI in the last 168 hours. CBG: Recent Labs  Lab 02/13/21 0622 02/13/21 1317 02/13/21 1614 02/13/21 2142 02/14/21 0610  GLUCAP 192* 154* 154* 320* 437*    Iron Studies:  Recent Labs    02/13/21 0332  FERRITIN 448*   Studies/Results: No results found.  atorvastatin  40 mg Oral Daily   carvedilol  25 mg Oral BID WC   Chlorhexidine Gluconate Cloth  6 each Topical Q0600   darbepoetin (ARANESP) injection -  DIALYSIS  60 mcg Intravenous Q Sat-HD   doxercalciferol  5 mcg Intravenous Q M,W,F-HD   doxycycline  100 mg Oral Q12H   feeding supplement ([name] CARB STEADY)  237 mL Oral TID BM   heparin  5,000 Units Subcutaneous Q12H   imipramine  25 mg Oral QHS   insulin aspart  0-5 Units Subcutaneous QHS   insulin aspart  0-6 Units Subcutaneous TID WC   insulin aspart  2 Units Subcutaneous TID WC   insulin glargine-yfgn  11 Units Subcutaneous QHS   losartan  50 mg Oral QHS   multivitamin  1 tablet Oral QHS   sevelamer carbonate  1,600 mg Oral TID WC   sodium chloride flush  3 mL Intravenous Q12H   torsemide  40 mg Oral Q T,Th,S,Su    BMET    Component Value Date/Time   NA 123 (L) 02/14/2021 0317   NA 143 09/16/2020 1356   K 4.4 02/14/2021 0317   CL 90 (L) 02/14/2021 0317   CO2 22 02/14/2021 0317   GLUCOSE 391 (H) 02/14/2021 0317   BUN 62 (H) 02/14/2021 0317   BUN 40 (H) 09/16/2020 1356   CREATININE 9.72 (H) 02/14/2021 0317   CALCIUM 7.7 (L) 02/14/2021 0317   CALCIUM 7.2 (L) 08/08/2020 0534   GFRNONAA 7 (L) 02/14/2021 0317   GFRAA 19 (L) 03/10/2020 1410   CBC    Component Value Date/Time   WBC 5.7 02/13/2021 0332   RBC 3.82 (L)  02/13/2021 0332   HGB 10.0 (L) 02/13/2021 0332   HGB 8.1 (L) 09/16/2020 1356   HCT 31.5 (L) 02/13/2021 0332   HCT 26.5 (L) 09/16/2020 1356   PLT 334 02/13/2021 0332   PLT 272 09/16/2020 1356   MCV 82.5 02/13/2021 0332   MCV 86 09/16/2020 1356   MCH 26.2 02/13/2021 0332   MCHC 31.7 02/13/2021 0332   RDW 18.6 (H) 02/13/2021 0332   RDW 15.3 09/16/2020 1356   LYMPHSABS 1.2 02/13/2021 0332   MONOABS 0.6 02/13/2021 0332   EOSABS 0.1 02/13/2021 0332   BASOSABS 0.0 02/13/2021 0332    Dialysis Orders: [facility] MWF 4 hrs 180NRe 450/800 86.5 kg 2.0 K/2.0 Ca L AVF -Heparin-none -[name] 5 mcg IV TIW -Venofer 100 mcg IV X 10 doses 3/7 doses given last dose 02/04/21 -Mircera 225 mcg IV q 2 weeks (last dose 02/02/2021)  Assessment/Plan:  HHS: Started on insulin  per primary with improvement in BS and K+. Per primary AMS: Thought to be related to elevated glucose +/- seizure. CT of head without acute changes/MRI neg as well. -  neuro involved no anti sz meds req COVID 19- Per primary-  on minimal O2, no medical treatment required Hypertensive Emergency: Medications and EDW adjusted at OP HD center last week. improved. UF with HD and continue to lower volume as tolerated. Coreg 12.5 BID-   cozaar 50 Torsemide 20 mg 2 tabs on non HD days.  BP is pretty good here-  suspect noncom with meds as OP Hyperkalemia-resolved with correction of BS. R toe ulcer: On Doxycycline, WC consult per primary  ESRD -  MWF-missed HD 07/29 D/T transportation issues. No heparin. 2.0 K bath.  Treatment got postponed Monday to Tuesday and has been off schedule.  Had HD Thursday and plan again today.  Will get back on outpatient schedule by Monday.  Hypertension/volume  - remains above edw by 3 kg.  Will cont to UF and try to get to edw tomorrow.  Anemia  - HGB  9.7--9.1. ESA here weekly   Metabolic bone disease -  C Ca OK, PO4 elevated. Increase Renvela to 2 tabs PO TID AC-  phos better. Continue VDRA.   Nutrition -Albumin  2.5. Renal Carb mod diet, add nepro, renal vits. Disposition -  will likely need placement since his aunt can no longer care for him.  SW/CM involved and speaking with family and pt.  Donetta Potts, MD [employer] [phone]

## 2021-02-15 DIAGNOSIS — E1110 Type 2 diabetes mellitus with ketoacidosis without coma: Secondary | ICD-10-CM | POA: Diagnosis not present

## 2021-02-15 LAB — BASIC METABOLIC PANEL
Anion gap: 8 (ref 5–15)
BUN: 44 mg/dL — ABNORMAL HIGH (ref 6–20)
CO2: 25 mmol/L (ref 22–32)
Calcium: 7.8 mg/dL — ABNORMAL LOW (ref 8.9–10.3)
Chloride: 91 mmol/L — ABNORMAL LOW (ref 98–111)
Creatinine, Ser: 7.78 mg/dL — ABNORMAL HIGH (ref 0.61–1.24)
GFR, Estimated: 9 mL/min — ABNORMAL LOW (ref >60)
Glucose, Bld: 411 mg/dL — ABNORMAL HIGH (ref 70–99)
Potassium: 4.3 mmol/L (ref 3.5–5.1)
Sodium: 124 mmol/L — ABNORMAL LOW (ref 135–145)

## 2021-02-15 LAB — GLUCOSE, CAPILLARY
Glucose-Capillary: 399 mg/dL — ABNORMAL HIGH (ref 70–99)
Glucose-Capillary: 240 mg/dL — ABNORMAL HIGH (ref 70–99)
Glucose-Capillary: 258 mg/dL — ABNORMAL HIGH (ref 70–99)
Glucose-Capillary: 314 mg/dL — ABNORMAL HIGH (ref 70–99)

## 2021-02-15 MED ORDER — LORATADINE 10 MG PO TABS
10.0000 mg | ORAL_TABLET | Freq: Every day | ORAL | Status: DC
Start: 2021-02-15 — End: 2021-02-27
  Administered 2021-02-15 – 2021-02-18 (×3): 10 mg via ORAL
  Filled 2021-02-15 (×10): qty 1

## 2021-02-15 MED ORDER — INSULIN GLARGINE-YFGN 100 UNIT/ML ~~LOC~~ SOLN
15.0000 [IU] | Freq: Every day | SUBCUTANEOUS | Status: DC
  Administered 2021-02-15: 15 [IU] via SUBCUTANEOUS
  Filled 2021-02-15 (×2): qty 0.15

## 2021-02-15 MED ORDER — INSULIN ASPART 100 UNIT/ML IJ SOLN
3.0000 [IU] | Freq: Three times a day (TID) | INTRAMUSCULAR | Status: DC
  Administered 2021-02-15 – 2021-02-16 (×3): 3 [IU] via SUBCUTANEOUS

## 2021-02-15 NOTE — Progress Notes (Signed)
PROGRESS NOTE    [name]  [id number] DOB: Jun 22, 1986 DOA: 02/08/2021 PCP: Gildardo Pounds, NP    Chief Complaint  Patient presents with   Seizures         Brief Narrative:  [name] is a 35 y.o. male with medical history significant of ESRD on HD, IDDM off insulin, HTN, HLD, legally blind, presented with syncope versus seizure.  He was thought to have seizures from elevated blood glucose levels.  He used to live with his aunt but she is unable to take him back home.  TOC on board and awaiting for disposition plan  Subjective:  No acute interval changes  Assessment & Plan:   Principal Problem:   Seizure-like activity ([place]) Active Problems:   Essential hypertension   DM2 (diabetes mellitus, type 2) (HCC)   Anemia of chronic kidney failure   Diabetic foot ulcer ([place])   Syncope   DKA (diabetic ketoacidosis) ([place])   Acute metabolic encephalopathy, resolved Initially thought seizure disorders from elevated blood glucose level EEG with questionable dysfunction in the left temporal region.  MRI was negative so possibly seizures from elevated blood glucose Control blood glucose Seen by neurology in consult , she is not on any antiepileptic medication   Insulin-dependent type 2 diabetes, uncontrolled with hyperglycemia, legally blind -Blood glucose elevated, increase long-acting insulin as well as meal coverage -Continue adjust accordingly  Right toe ulcer Continue doxycycline per podiatry Follow-up with podiatry  Acute on chronic combined CHF -Echo obtained this hospitalization showed reduced EF 45%, need outpatient cardiology follow-up -Volume improved by dialysis  ESRD on dialysis Monday Wednesday Friday Nephrology following  COVID infection Does not appear to have symptom Initial chest x-ray consistent with volume overload Received monoclonal antibody x1 on admission  Nutritional Assessment:  The patient's BMI is: Body mass index is 27.82  kg/m.Marland Kitchen  Seen by dietician.  I agree with the assessment and plan as outlined below:  Nutrition Status: Nutrition Problem: Increased nutrient needs Etiology: chronic illness (ESRD on HD) Signs/Symptoms: estimated needs Interventions: MVI, [name] shake, Education  .  Unresulted Labs (From admission, onward)     Start     Ordered   Signed and Held  Renal function panel  Once,   R       Question:  Specimen collection method  Answer:  Lab=Lab collect   Signed and Held   Signed and Held  CBC  Once,   R       Question:  Specimen collection method  Answer:  Lab=Lab collect   Signed and Held   Signed and Held  Renal function panel  Once,   R       Question:  Specimen collection method  Answer:  Lab=Lab collect   Signed and Held   Signed and Held  CBC  Once,   R       Question:  Specimen collection method  Answer:  Lab=Lab collect   Signed and Held   Signed and Held  Renal function panel  Once,   R       Question:  Specimen collection method  Answer:  Lab=Lab collect   Signed and Held   Signed and Held  CBC  Once,   R       Question:  Specimen collection method  Answer:  Lab=Lab collect   Signed and Held              DVT prophylaxis: heparin injection 5,000 Units Start: 02/08/21 2200  Code Status: Full Family Communication: patient Disposition:   Status is: Inpatient   Dispo: The patient is from: home              Anticipated d/c is to: SNF              Anticipated d/c date is: TBD              Patient currently awaiting for disposition, he used to live with his aunt but she is unable to take him back home.   Consultants:  Nephrology Neurology  Procedures:  Dialysis  Antimicrobials:   Anti-infectives (From admission, onward)    Start     Dose/Rate Route Frequency Ordered Stop   02/08/21 2200  doxycycline (VIBRA-TABS) tablet 100 mg        100 mg Oral Every 12 hours 02/08/21 1718             Objective: Vitals:   02/15/21 0108 02/15/21 0425 02/15/21  0756 02/15/21 1126  BP: (!) 159/99 (!) 157/98 (!) 141/80 124/69  Pulse: 80 76 78 76  Resp:  '18 18 18  '$ Temp:  98.2 F (36.8 C) 98.1 F (36.7 C) 99 F (37.2 C)  TempSrc:  Oral Oral Oral  SpO2:  100% 100% 100%  Weight:  93 kg    Height:        Intake/Output Summary (Last 24 hours) at 02/15/2021 1314 Last data filed at 02/15/2021 0948 Gross per 24 hour  Intake 880 ml  Output 4000 ml  Net -3120 ml   Filed Weights   02/14/21 1505 02/14/21 1841 02/15/21 0425  Weight: 95 kg 91 kg 93 kg    Examination:  General exam: calm, NAD, legally blind, only see shadows Respiratory system: Clear to auscultation. Respiratory effort normal. Cardiovascular system: S1 & S2 heard, RRR. No JVD, no murmur, No pedal edema. Gastrointestinal system: Abdomen is nondistended, soft and nontender.  Normal bowel sounds heard. Central nervous system: Alert and oriented. No focal neurological deficits. Extremities: Symmetric 5 x 5 power. Skin: No rashes, lesions or ulcers Psychiatry: Judgement and insight appear normal. Mood & affect appropriate.     Data Reviewed: I have personally reviewed following labs and imaging studies  CBC: Recent Labs  Lab 02/09/21 0256 02/10/21 0357 02/11/21 0210 02/12/21 0509 02/13/21 0332  WBC 10.8* 7.8 6.6 6.2 5.7  NEUTROABS 9.2* 6.1 5.2 4.5 3.8  HGB 9.7* 9.1* 10.1* 9.9* 10.0*  HCT 28.7* 27.9* 32.8* 31.8* 31.5*  MCV 79.7* 81.1 83.5 83.0 82.5  PLT 367 356 382 348 [id number]    Basic Metabolic Panel: Recent Labs  Lab 02/09/21 0256 02/10/21 0357 02/11/21 0210 02/12/21 0509 02/13/21 0332 02/14/21 0317 02/15/21 0537  NA 131*  130* 130* 132* 126* 129* 123* 124*  K 4.9  4.9 4.8 4.2 4.3 4.2 4.4 4.3  CL 91*  92* 90* 94* 88* 93* 90* 91*  CO2 '23  22 22 26 22 27 22 25  '$ GLUCOSE 153*  154* 204* 357* 266* 260* 391* 411*  BUN 73*  75* 90* 52* 63* 43* 62* 44*  CREATININE 13.43*  13.58* 15.01* 10.07* 11.37* 8.25* 9.72* 7.78*  CALCIUM 8.5*  8.4* 8.0* 7.6* 7.9* 7.8* 7.7*  7.8*  MG 2.4 2.5* 2.0 2.4 2.1  --   --   PHOS 7.1* 7.5* 5.4* 5.8* 4.5  --   --     GFR: Estimated Creatinine Clearance: 14.4 mL/min (A) (by C-G formula based on SCr of 7.78 mg/dL (H)).  Liver  Function Tests: Recent Labs  Lab 02/09/21 0256 02/10/21 0357 02/11/21 0210 02/12/21 0509 02/13/21 0332  AST 19 13* 15 12* 16  ALT 36 '25 25 20 21  '$ ALKPHOS 134* 127* 143* 130* 120  BILITOT 1.2 1.0 1.1 0.7 0.8  PROT 6.7 6.2* 6.5 6.1* 5.8*  ALBUMIN 3.1* 2.8* 2.8* 2.7* 2.5*    CBG: Recent Labs  Lab 02/14/21 0610 02/14/21 1257 02/14/21 2126 02/15/21 0628 02/15/21 1122  GLUCAP 437* 165* 359* 399* 240*     Recent Results (from the past 240 hour(s))  Resp Panel by RT-PCR (Flu A&B, Covid) Nasopharyngeal Swab     Status: Abnormal   Collection Time: 02/08/21  2:08 PM   Specimen: Nasopharyngeal Swab; Nasopharyngeal(NP) swabs in vial transport medium  Result Value Ref Range Status   SARS Coronavirus 2 by RT PCR POSITIVE (A) NEGATIVE Final    Comment: RESULT CALLED TO, READ BACK BY AND VERIFIED WITH: B,WRIGHT '@1613'$  02/08/21 EB (NOTE) SARS-CoV-2 target nucleic acids are DETECTED.  The SARS-CoV-2 RNA is generally detectable in upper respiratory specimens during the acute phase of infection. Positive results are indicative of the presence of the identified virus, but do not rule out bacterial infection or co-infection with other pathogens not detected by the test. Clinical correlation with patient history and other diagnostic information is necessary to determine patient infection status. The expected result is Negative.  Fact Sheet for Patients: EntrepreneurPulse.com.au  Fact Sheet for Healthcare Providers: IncredibleEmployment.be  This test is not yet approved or cleared by the [place] FDA and  has been authorized for detection and/or diagnosis of SARS-CoV-2 by FDA under an Emergency Use Authorization (EUA).  This EUA will remain in effect  (meaning this test can be used) f or the duration of  the COVID-19 declaration under Section 564(b)(1) of the Act, 21 U.S.C. section 360bbb-3(b)(1), unless the authorization is terminated or revoked sooner.     Influenza A by PCR NEGATIVE NEGATIVE Final   Influenza B by PCR NEGATIVE NEGATIVE Final    Comment: (NOTE) The Xpert Xpress SARS-CoV-2/FLU/RSV plus assay is intended as an aid in the diagnosis of influenza from Nasopharyngeal swab specimens and should not be used as a sole basis for treatment. Nasal washings and aspirates are unacceptable for Xpert Xpress SARS-CoV-2/FLU/RSV testing.  Fact Sheet for Patients: EntrepreneurPulse.com.au  Fact Sheet for Healthcare Providers: IncredibleEmployment.be  This test is not yet approved or cleared by the [place] FDA and has been authorized for detection and/or diagnosis of SARS-CoV-2 by FDA under an Emergency Use Authorization (EUA). This EUA will remain in effect (meaning this test can be used) for the duration of the COVID-19 declaration under Section 564(b)(1) of the Act, 21 U.S.C. section 360bbb-3(b)(1), unless the authorization is terminated or revoked.  Performed at [facility], [place] [address] [place], [place] [address]          Radiology Studies: No results found.      Scheduled Meds:  atorvastatin  40 mg Oral Daily   carvedilol  25 mg Oral BID WC   Chlorhexidine Gluconate Cloth  6 each Topical Q0600   darbepoetin (ARANESP) injection - DIALYSIS  60 mcg Intravenous Q Sat-HD   doxercalciferol  5 mcg Intravenous Q M,W,F-HD   doxycycline  100 mg Oral Q12H   feeding supplement ([name] CARB STEADY)  237 mL Oral TID BM   heparin  5,000 Units Subcutaneous Q12H   imipramine  25 mg Oral QHS   insulin aspart  0-5 Units Subcutaneous  QHS   insulin aspart  0-6 Units Subcutaneous TID WC   insulin aspart  3 Units Subcutaneous TID WC   insulin glargine-yfgn  15 Units  Subcutaneous QHS   labetalol  10 mg Intravenous Once   losartan  50 mg Oral QHS   multivitamin  1 tablet Oral QHS   sevelamer carbonate  1,600 mg Oral TID WC   sodium chloride flush  3 mL Intravenous Q12H   torsemide  40 mg Oral Q T,Th,S,Su   Continuous Infusions:  sodium chloride     promethazine (PHENERGAN) injection (IM or IVPB)       LOS: 7 days   Time spent: mins Greater than 50% of this time was spent in counseling, explanation of diagnosis, planning of further management, and coordination of care.   Voice Recognition Viviann Spare dictation system was used to create this note, attempts have been made to correct errors. Please contact the author with questions and/or clarifications.   Florencia Reasons, MD PhD FACP [facility]  Available via Epic secure chat 7am-7pm for nonurgent issues Please page for urgent issues To page the attending provider between 7A-7P or the covering provider during after hours 7P-7A, please log into the web site [email or web address] and access using universal [place] password for that web site. If you do not have the password, please call the hospital operator.    02/15/2021, 1:14 PM

## 2021-02-15 NOTE — Progress Notes (Signed)
Patient ID: [name], male   DOB: 08/20/1985, 35 y.o.   MRN: [id number] S:No issues overnight O:BP (!) 141/80 (BP Location: Right Arm)   Pulse 78   Temp 98.1 F (36.7 C) (Oral)   Resp 18   Ht 6' (1.829 m)   Wt 93 kg   SpO2 100%   BMI 27.82 kg/m   Intake/Output Summary (Last 24 hours) at 02/15/2021 1034 Last data filed at 02/15/2021 0948 Gross per 24 hour  Intake 1120 ml  Output 4000 ml  Net -2880 ml   Intake/Output: I/O last 3 completed shifts: In: 3 [P.O.:720] Out: 4000 [Other:4000]  Intake/Output this shift:  Total I/O In: 400 [P.O.:400] Out: -  Weight change:  Physical exam: unable to complete due to COVID + status.  In order to preserve PPE equipment and to minimize exposure to providers.  Notes from other caregivers reviewed   Recent Labs  Lab 02/08/21 1351 02/08/21 1401 02/09/21 0256 02/10/21 0357 02/11/21 0210 02/12/21 0509 02/13/21 0332 02/14/21 0317 02/15/21 0537  NA 124*   < > 131*  130* 130* 132* 126* 129* 123* 124*  K 5.7*   < > 4.9  4.9 4.8 4.2 4.3 4.2 4.4 4.3  CL 86*   < > 91*  92* 90* 94* 88* 93* 90* 91*  CO2 20*   < > '23  22 22 26 22 27 22 25  '$ GLUCOSE 789*   < > 153*  154* 204* 357* 266* 260* 391* 411*  BUN 69*   < > 73*  75* 90* 52* 63* 43* 62* 44*  CREATININE 13.19*   < > 13.43*  13.58* 15.01* 10.07* 11.37* 8.25* 9.72* 7.78*  ALBUMIN 3.3*  --  3.1* 2.8* 2.8* 2.7* 2.5*  --   --   CALCIUM 8.2*   < > 8.5*  8.4* 8.0* 7.6* 7.9* 7.8* 7.7* 7.8*  PHOS  --   --  7.1* 7.5* 5.4* 5.8* 4.5  --   --   AST 26  --  19 13* 15 12* 16  --   --   ALT 41  --  36 '25 25 20 21  '$ --   --    < > = values in this interval not displayed.   Liver Function Tests: Recent Labs  Lab 02/11/21 0210 02/12/21 0509 02/13/21 0332  AST 15 12* 16  ALT '25 20 21  '$ ALKPHOS 143* 130* 120  BILITOT 1.1 0.7 0.8  PROT 6.5 6.1* 5.8*  ALBUMIN 2.8* 2.7* 2.5*   No results for input(s): LIPASE, AMYLASE in the last 168 hours. No results for input(s): AMMONIA in the last 168  hours. CBC: Recent Labs  Lab 02/09/21 0256 02/10/21 0357 02/11/21 0210 02/12/21 0509 02/13/21 0332  WBC 10.8* 7.8 6.6 6.2 5.7  NEUTROABS 9.2* 6.1 5.2 4.5 3.8  HGB 9.7* 9.1* 10.1* 9.9* 10.0*  HCT 28.7* 27.9* 32.8* 31.8* 31.5*  MCV 79.7* 81.1 83.5 83.0 82.5  PLT 367 356 382 348 334   Cardiac Enzymes: No results for input(s): CKTOTAL, CKMB, CKMBINDEX, TROPONINI in the last 168 hours. CBG: Recent Labs  Lab 02/13/21 2142 02/14/21 0610 02/14/21 1257 02/14/21 2126 02/15/21 0628  GLUCAP 320* 437* 165* 359* 399*    Iron Studies:  Recent Labs    02/13/21 0332  FERRITIN 448*   Studies/Results: No results found.  atorvastatin  40 mg Oral Daily   carvedilol  25 mg Oral BID WC   Chlorhexidine Gluconate Cloth  6 each Topical Q0600  darbepoetin (ARANESP) injection - DIALYSIS  60 mcg Intravenous Q Sat-HD   doxercalciferol  5 mcg Intravenous Q M,W,F-HD   doxycycline  100 mg Oral Q12H   feeding supplement ([name] CARB STEADY)  237 mL Oral TID BM   heparin  5,000 Units Subcutaneous Q12H   imipramine  25 mg Oral QHS   insulin aspart  0-5 Units Subcutaneous QHS   insulin aspart  0-6 Units Subcutaneous TID WC   insulin aspart  2 Units Subcutaneous TID WC   insulin glargine-yfgn  11 Units Subcutaneous QHS   labetalol  10 mg Intravenous Once   losartan  50 mg Oral QHS   multivitamin  1 tablet Oral QHS   sevelamer carbonate  1,600 mg Oral TID WC   sodium chloride flush  3 mL Intravenous Q12H   torsemide  40 mg Oral Q T,Th,S,Su    BMET    Component Value Date/Time   NA 124 (L) 02/15/2021 0537   NA 143 09/16/2020 1356   K 4.3 02/15/2021 0537   CL 91 (L) 02/15/2021 0537   CO2 25 02/15/2021 0537   GLUCOSE 411 (H) 02/15/2021 0537   BUN 44 (H) 02/15/2021 0537   BUN 40 (H) 09/16/2020 1356   CREATININE 7.78 (H) 02/15/2021 0537   CALCIUM 7.8 (L) 02/15/2021 0537   CALCIUM 7.2 (L) 08/08/2020 0534   GFRNONAA 9 (L) 02/15/2021 0537   GFRAA 19 (L) 03/10/2020 1410   CBC     Component Value Date/Time   WBC 5.7 02/13/2021 0332   RBC 3.82 (L) 02/13/2021 0332   HGB 10.0 (L) 02/13/2021 0332   HGB 8.1 (L) 09/16/2020 1356   HCT 31.5 (L) 02/13/2021 0332   HCT 26.5 (L) 09/16/2020 1356   PLT 334 02/13/2021 0332   PLT 272 09/16/2020 1356   MCV 82.5 02/13/2021 0332   MCV 86 09/16/2020 1356   MCH 26.2 02/13/2021 0332   MCHC 31.7 02/13/2021 0332   RDW 18.6 (H) 02/13/2021 0332   RDW 15.3 09/16/2020 1356   LYMPHSABS 1.2 02/13/2021 0332   MONOABS 0.6 02/13/2021 0332   EOSABS 0.1 02/13/2021 0332   BASOSABS 0.0 02/13/2021 0332    Dialysis Orders: [facility] MWF 4 hrs 180NRe 450/800 86.5 kg 2.0 K/2.0 Ca L AVF -Heparin-none -[name] 5 mcg IV TIW -Venofer 100 mcg IV X 10 doses 3/7 doses given last dose 02/04/21 -Mircera 225 mcg IV q 2 weeks (last dose 02/02/2021)   Assessment/Plan:  HHS: Started on insulin  per primary with improvement in BS and K+. Per primary AMS: Thought to be related to elevated glucose +/- seizure. CT of head without acute changes/MRI neg as well. -  neuro involved no anti sz meds req COVID 19- Per primary-  on minimal O2, no medical treatment required Hypertensive Emergency: Medications and EDW adjusted at OP HD center last week. improved. UF with HD and continue to lower volume as tolerated. Coreg 12.5 BID-   cozaar 50 Torsemide 20 mg 2 tabs on non HD days.  BP is pretty good here-  suspect noncom with meds as OP Hyperkalemia-resolved with correction of BS. R toe ulcer: On Doxycycline, WC consult per primary  ESRD -  MWF-missed HD 07/29 D/T transportation issues. No heparin. 2.0 K bath.  Treatment got postponed Monday to Tuesday and has been off schedule.  Had HD Thursday and Saturday.  Will get back on outpatient schedule tomorrow, unless staffing would prefer to keep covid patients on TTS schedule.  Hypertension/volume  - remains above  edw by 3 kg.  Will cont to UF and try to get to edw tomorrow.  Anemia  - HGB 9.7--9.1. ESA here weekly    Metabolic bone disease -  C Ca OK, PO4 elevated. Increase Renvela to 2 tabs PO TID AC-  phos better. Continue VDRA.   Nutrition -Albumin 2.5. Renal Carb mod diet, add nepro, renal vits. Disposition -  will likely need placement since his aunt can no longer care for him.  SW/CM involved and speaking with family and pt.  Donetta Potts, MD [employer] [phone]

## 2021-02-16 DIAGNOSIS — E1110 Type 2 diabetes mellitus with ketoacidosis without coma: Secondary | ICD-10-CM | POA: Diagnosis not present

## 2021-02-16 LAB — GLUCOSE, CAPILLARY
Glucose-Capillary: 390 mg/dL — ABNORMAL HIGH (ref 70–99)
Glucose-Capillary: 357 mg/dL — ABNORMAL HIGH (ref 70–99)
Glucose-Capillary: 331 mg/dL — ABNORMAL HIGH (ref 70–99)
Glucose-Capillary: 186 mg/dL — ABNORMAL HIGH (ref 70–99)
Glucose-Capillary: 209 mg/dL — ABNORMAL HIGH (ref 70–99)

## 2021-02-16 LAB — RENAL FUNCTION PANEL
Albumin: 2.7 g/dL — ABNORMAL LOW (ref 3.5–5.0)
Anion gap: 12 (ref 5–15)
BUN: 65 mg/dL — ABNORMAL HIGH (ref 6–20)
CO2: 23 mmol/L (ref 22–32)
Calcium: 8.1 mg/dL — ABNORMAL LOW (ref 8.9–10.3)
Chloride: 90 mmol/L — ABNORMAL LOW (ref 98–111)
Creatinine, Ser: 9.99 mg/dL — ABNORMAL HIGH (ref 0.61–1.24)
GFR, Estimated: 6 mL/min — ABNORMAL LOW (ref >60)
Glucose, Bld: 366 mg/dL — ABNORMAL HIGH (ref 70–99)
Phosphorus: 4.9 mg/dL — ABNORMAL HIGH (ref 2.5–4.6)
Potassium: 4.4 mmol/L (ref 3.5–5.1)
Sodium: 125 mmol/L — ABNORMAL LOW (ref 135–145)

## 2021-02-16 LAB — CBC
HCT: 31.3 % — ABNORMAL LOW (ref 39.0–52.0)
Hemoglobin: 10.1 g/dL — ABNORMAL LOW (ref 13.0–17.0)
MCH: 26.2 pg (ref 26.0–34.0)
MCHC: 32.3 g/dL (ref 30.0–36.0)
MCV: 81.3 fL (ref 80.0–100.0)
Platelets: 281 10*3/uL (ref 150–400)
RBC: 3.85 MIL/uL — ABNORMAL LOW (ref 4.22–5.81)
RDW: 17.8 % — ABNORMAL HIGH (ref 11.5–15.5)
WBC: 5.7 10*3/uL (ref 4.0–10.5)
nRBC: 0 % (ref 0.0–0.2)

## 2021-02-16 MED ORDER — DARBEPOETIN ALFA 60 MCG/0.3ML IJ SOSY
60.0000 ug | PREFILLED_SYRINGE | INTRAMUSCULAR | Status: DC
  Administered 2021-03-02: 60 ug via INTRAVENOUS
  Filled 2021-02-16 (×2): qty 0.3

## 2021-02-16 MED ORDER — INSULIN ASPART 100 UNIT/ML IJ SOLN
4.0000 [IU] | Freq: Three times a day (TID) | INTRAMUSCULAR | Status: DC
  Administered 2021-02-16 – 2021-02-19 (×7): 4 [IU] via SUBCUTANEOUS

## 2021-02-16 MED ORDER — INSULIN GLARGINE-YFGN 100 UNIT/ML ~~LOC~~ SOLN
18.0000 [IU] | Freq: Every day | SUBCUTANEOUS | Status: DC
  Administered 2021-02-16 – 2021-02-18 (×3): 18 [IU] via SUBCUTANEOUS
  Filled 2021-02-16 (×4): qty 0.18

## 2021-02-16 NOTE — Progress Notes (Signed)
Inpatient Diabetes Program Recommendations  AACE/ADA: New Consensus Statement on Inpatient Glycemic Control  Target Ranges:  Prepandial:   less than 140 mg/dL      Peak postprandial:   less than 180 mg/dL (1-2 hours)      Critically ill patients:  140 - 180 mg/dL      Review of Glycemic Control Results for NETANEL, HANELINE (MRN [id number]) as of 02/16/2021 08:58  Ref. Range 02/15/2021 06:28 02/15/2021 11:22 02/15/2021 15:31 02/15/2021 21:20 02/16/2021 06:18  Glucose-Capillary Latest Ref Range: 70 - 99 mg/dL 399 (H) 240 (H) 258 (H) 314 (H) 390 (H)   Diabetes history: DM2 Outpatient Diabetes medications: Levemir 10 units daily (not taken in at least 4 months) Current orders for Inpatient glycemic control: Semglee 15 units QHS, Novlog 0-6 units TID with meals, Novolog 0-5 units QHS, Novolog 3 units TID with meals   [name] tid between meals  Inpatient Diabetes Program Recommendations:   -  Consider increasing Semglee to 18 units QHS -  Increase meal coverage to Novolog 4 units TID with meals for meal coverage.  Thanks, Tama Headings RN, MSN, BC-ADM Inpatient Diabetes Coordinator Team Pager [phone] (8a-5p)

## 2021-02-16 NOTE — Progress Notes (Signed)
[facility] Progress Note   Subjective: Not seen directly D/T COVID 19.     Objective Vitals:   02/16/21 0052 02/16/21 0437 02/16/21 0548 02/16/21 0842  BP: (!) 149/92 133/79  (!) 162/105  Pulse: 80 79  74  Resp: '16 17  17  '$ Temp: 98.2 F (36.8 C) 98.1 F (36.7 C)  98 F (36.7 C)  TempSrc: Oral Oral  Oral  SpO2: 100% 100%  100%  Weight:   95.8 kg   Height:       Physical Exam Physical exam: unable to complete due to COVID + status.  In order to preserve PPE equipment and to minimize exposure to providers.  Notes from other caregivers reviewed   Additional Objective Labs: Basic Metabolic Panel: Recent Labs  Lab 02/12/21 0509 02/13/21 0332 02/14/21 0317 02/15/21 0537 02/16/21 0654  NA 126* 129* 123* 124* 125*  K 4.3 4.2 4.4 4.3 4.4  CL 88* 93* 90* 91* 90*  CO2 '22 27 22 25 23  '$ GLUCOSE 266* 260* 391* 411* 366*  BUN 63* 43* 62* 44* 65*  CREATININE 11.37* 8.25* 9.72* 7.78* 9.99*  CALCIUM 7.9* 7.8* 7.7* 7.8* 8.1*  PHOS 5.8* 4.5  --   --  4.9*   Liver Function Tests: Recent Labs  Lab 02/11/21 0210 02/12/21 0509 02/13/21 0332 02/16/21 0654  AST 15 12* 16  --   ALT '25 20 21  '$ --   ALKPHOS 143* 130* 120  --   BILITOT 1.1 0.7 0.8  --   PROT 6.5 6.1* 5.8*  --   ALBUMIN 2.8* 2.7* 2.5* 2.7*   No results for input(s): LIPASE, AMYLASE in the last 168 hours. CBC: Recent Labs  Lab 02/10/21 0357 02/11/21 0210 02/12/21 0509 02/13/21 0332 02/16/21 0654  WBC 7.8 6.6 6.2 5.7 5.7  NEUTROABS 6.1 5.2 4.5 3.8  --   HGB 9.1* 10.1* 9.9* 10.0* 10.1*  HCT 27.9* 32.8* 31.8* 31.5* 31.3*  MCV 81.1 83.5 83.0 82.5 81.3  PLT 356 382 348 334 281   Blood Culture    Component Value Date/Time   SDES BLOOD BLOOD RIGHT FOREARM 11/24/2020 1955   SPECREQUEST  11/24/2020 1955    BOTTLES DRAWN AEROBIC AND ANAEROBIC Blood Culture adequate volume   CULT  11/24/2020 1955    NO GROWTH 6 DAYS Performed at [facility], [place]. [address] [place], [place] [address]     REPTSTATUS 11/30/2020 FINAL 11/24/2020 1955    Cardiac Enzymes: No results for input(s): CKTOTAL, CKMB, CKMBINDEX, TROPONINI in the last 168 hours. CBG: Recent Labs  Lab 02/15/21 1122 02/15/21 1531 02/15/21 2120 02/16/21 0618 02/16/21 0857  GLUCAP 240* 258* 314* 390* 357*   Iron Studies: No results for input(s): IRON, TIBC, TRANSFERRIN, FERRITIN in the last 72 hours. '@[email or web address]'$ @ Studies/Results: No results found. Medications:  sodium chloride     promethazine (PHENERGAN) injection (IM or IVPB)      atorvastatin  40 mg Oral Daily   carvedilol  25 mg Oral BID WC   Chlorhexidine Gluconate Cloth  6 each Topical Q0600   darbepoetin (ARANESP) injection - DIALYSIS  60 mcg Intravenous Q Sat-HD   doxercalciferol  5 mcg Intravenous Q M,W,F-HD   doxycycline  100 mg Oral Q12H   feeding supplement ([name] CARB STEADY)  237 mL Oral TID BM   heparin  5,000 Units Subcutaneous Q12H   imipramine  25 mg Oral QHS   insulin aspart  0-5 Units Subcutaneous QHS   insulin aspart  0-6 Units  Subcutaneous TID WC   insulin aspart  3 Units Subcutaneous TID WC   insulin glargine-yfgn  15 Units Subcutaneous QHS   labetalol  10 mg Intravenous Once   loratadine  10 mg Oral Daily   losartan  50 mg Oral QHS   multivitamin  1 tablet Oral QHS   sevelamer carbonate  1,600 mg Oral TID WC   sodium chloride flush  3 mL Intravenous Q12H   torsemide  40 mg Oral Q T,Th,S,Su     Dialysis Orders: [facility] MWF 4 hrs 180NRe 450/800 86.5 kg 2.0 K/2.0 Ca L AVF -Heparin-none -[name] 5 mcg IV TIW -Venofer 100 mcg IV X 10 doses 3/7 doses given last dose 02/04/21 -Mircera 225 mcg IV q 2 weeks (last dose 02/02/2021)   Assessment/Plan:  HHS: Started on insulin  per primary with improvement in BS and K+. Per primary AMS: Thought to be related to elevated glucose +/- seizure. CT of head without acute changes/MRI neg as well. -  neuro involved no anti sz meds req COVID 19- Per primary-  on minimal O2, no medical  treatment required Hypertensive Emergency: Medications and EDW adjusted at OP HD center last week. improved. UF with HD and continue to lower volume as tolerated. Coreg 12.5 BID-   cozaar 50 Torsemide 20 mg 2 tabs on non HD days.  BP is pretty good here-  suspect noncom with meds as OP Hyperkalemia-resolved with correction of BS. R toe ulcer: On Doxycycline, WC consult per primary  ESRD -  MWF-Resume regular MWF Schedule. Next HD 02/16/2021  Hypertension/volume  - remains above edw by 3 kg.  Will cont to UF and try to get to edw tomorrow.  Anemia  - HGB 9.7--9.1. ESA here weekly   Metabolic bone disease -  C Ca OK, PO4 elevated. Increase Renvela to 2 tabs PO TID AC-  phos better. Continue VDRA.   Nutrition -Albumin 2.5. Renal Carb mod diet, add nepro, renal vits. Disposition -  will likely need placement since his aunt can no longer care for him.  SW/CM involved and speaking with family and pt.  Armonee Bojanowski H. Emma Schupp NP-C 02/16/2021, 11:03 AM  [employer] [phone]

## 2021-02-16 NOTE — Progress Notes (Signed)
Received Verbal order from  Dr. Tawanna Solo for pt to get in shower.

## 2021-02-16 NOTE — Progress Notes (Signed)
PROGRESS NOTE    [name]  [id number] DOB: 1985/10/07 DOA: 02/08/2021 PCP: Gildardo Pounds, NP   Chief Complain: Seizures  Brief Narrative:  Patient is a 35 year old male with history of ESRD on dialysis,IDDM off insulin, HTN, HLD, legally blind, presented with syncope versus seizure.  Blood glucose were severely elevated on presentation.  He was thought to have seizures from elevated blood glucose levels.  Found to have incidental positive for COVID, no respiratory issues.  He used to live with his aunt but she is unable to take him back home.  TOC on board and awaiting for disposition plan Assessment & Plan:   Principal Problem:   Seizure-like activity ([place]) Active Problems:   Essential hypertension   DM2 (diabetes mellitus, type 2) ([place])   Anemia of chronic kidney failure   Diabetic foot ulcer ([place])   Syncope   DKA (diabetic ketoacidosis) ([place])   Acute metabolic encephalopathy: Initially thought to have seizure from hyperglycemia.  EEG with questionable dysfunction in the left temporal region.  MRI was negative.  Encephalopathy has resolved.  Currently alert and oriented.  Patient is legally blind.  Hyperglycemia/insulin-dependent diabetes type 2: Elevated blood glucose on presentation. Still hyperglycemic, Continue current insulin regimen.  Diabetic coordinator following.  Incidental COVID-positive: Asymptomatic.  Initial chest x-ray was consistent with volume overload.  Received 1 dose of monoclonal antibody on admission.  ESRD on dialysis: Nephrology following.  Volume management as per dialysis.  Chronic combined congestive heart failure: Echo obtained during this hospitalization showed EF of 45%, needs outpatient cardiology follow-up.  Volume management as per dialysis  Right toe ulcer: On doxycycline as per podiatry.  Recommend outpatient follow-up.  Hypertension: Continue current medications.  Monitor blood pressure.  Disposition: Patient is medically  stable for discharge.  Previously was living with aunt but she is not able to take him back.  Disposition issue.  TOC following        Nutrition Problem: Increased nutrient needs Etiology: chronic illness (ESRD on HD)      DVT prophylaxis:Heparin  Code Status: Full Family Communication: None at bedside Status is: Inpatient  Remains inpatient appropriate because:Inpatient level of care appropriate due to severity of illness  Dispo: The patient is from: Home              Anticipated d/c is to: Home              Patient currently is medically stable to d/c.   Difficult to place patient Yes     Consultants: Nephrology  Procedures:Dialysis  Antimicrobials:  Anti-infectives (From admission, onward)    Start     Dose/Rate Route Frequency Ordered Stop   02/08/21 2200  doxycycline (VIBRA-TABS) tablet 100 mg        100 mg Oral Every 12 hours 02/08/21 1718         Subjective:  Patient seen and examined the bedside this afternoon.  Hemodynamically stable comfortable.  No new complaints   Objective: Vitals:   02/16/21 0052 02/16/21 0437 02/16/21 0548 02/16/21 0842  BP: (!) 149/92 133/79  (!) 162/105  Pulse: 80 79  74  Resp: '16 17  17  '$ Temp: 98.2 F (36.8 C) 98.1 F (36.7 C)  98 F (36.7 C)  TempSrc: Oral Oral  Oral  SpO2: 100% 100%  100%  Weight:   95.8 kg   Height:       No intake or output data in the 24 hours ending 02/16/21 [place]  02/14/21 1841 02/15/21 0425 02/16/21 0548  Weight: 91 kg 93 kg 95.8 kg    Examination:  General exam: Overall comfortable, not in distress HEENT: Legally Blind Respiratory system:  no wheezes or crackles  Cardiovascular system: S1 & S2 heard, RRR.  Gastrointestinal system: Abdomen is nondistended, soft and nontender. Central nervous system: Alert and oriented Extremities: No edema, no clubbing ,no cyanosis, dialysis access on the left forearm Skin: No rashes, no ulcers,no icterus       Data Reviewed: I  have personally reviewed following labs and imaging studies  CBC: Recent Labs  Lab 02/10/21 0357 02/11/21 0210 02/12/21 0509 02/13/21 0332 02/16/21 0654  WBC 7.8 6.6 6.2 5.7 5.7  NEUTROABS 6.1 5.2 4.5 3.8  --   HGB 9.1* 10.1* 9.9* 10.0* 10.1*  HCT 27.9* 32.8* 31.8* 31.5* 31.3*  MCV 81.1 83.5 83.0 82.5 81.3  PLT 356 382 348 334 [id number]   Basic Metabolic Panel: Recent Labs  Lab 02/10/21 0357 02/11/21 0210 02/12/21 0509 02/13/21 0332 02/14/21 0317 02/15/21 0537 02/16/21 0654  NA 130* 132* 126* 129* 123* 124* 125*  K 4.8 4.2 4.3 4.2 4.4 4.3 4.4  CL 90* 94* 88* 93* 90* 91* 90*  CO2 '22 26 22 27 22 25 23  '$ GLUCOSE 204* 357* 266* 260* 391* 411* 366*  BUN 90* 52* 63* 43* 62* 44* 65*  CREATININE 15.01* 10.07* 11.37* 8.25* 9.72* 7.78* 9.99*  CALCIUM 8.0* 7.6* 7.9* 7.8* 7.7* 7.8* 8.1*  MG 2.5* 2.0 2.4 2.1  --   --   --   PHOS 7.5* 5.4* 5.8* 4.5  --   --  4.9*   GFR: Estimated Creatinine Clearance: 12.3 mL/min (A) (by C-G formula based on SCr of 9.99 mg/dL (H)). Liver Function Tests: Recent Labs  Lab 02/10/21 0357 02/11/21 0210 02/12/21 0509 02/13/21 0332 02/16/21 0654  AST 13* 15 12* 16  --   ALT '25 25 20 21  '$ --   ALKPHOS 127* 143* 130* 120  --   BILITOT 1.0 1.1 0.7 0.8  --   PROT 6.2* 6.5 6.1* 5.8*  --   ALBUMIN 2.8* 2.8* 2.7* 2.5* 2.7*   No results for input(s): LIPASE, AMYLASE in the last 168 hours. No results for input(s): AMMONIA in the last 168 hours. Coagulation Profile: No results for input(s): INR, PROTIME in the last 168 hours. Cardiac Enzymes: No results for input(s): CKTOTAL, CKMB, CKMBINDEX, TROPONINI in the last 168 hours. BNP (last 3 results) No results for input(s): PROBNP in the last 8760 hours. HbA1C: No results for input(s): HGBA1C in the last 72 hours. CBG: Recent Labs  Lab 02/15/21 1531 02/15/21 2120 02/16/21 0618 02/16/21 0857 02/16/21 1220  GLUCAP 258* 314* 390* 357* 331*   Lipid Profile: No results for input(s): CHOL, HDL, LDLCALC,  TRIG, CHOLHDL, LDLDIRECT in the last 72 hours. Thyroid Function Tests: No results for input(s): TSH, T4TOTAL, FREET4, T3FREE, THYROIDAB in the last 72 hours. Anemia Panel: No results for input(s): VITAMINB12, FOLATE, FERRITIN, TIBC, IRON, RETICCTPCT in the last 72 hours. Sepsis Labs: Recent Labs  Lab 02/11/21 0210 02/12/21 0509  PROCALCITON 1.33 0.84    Recent Results (from the past 240 hour(s))  Resp Panel by RT-PCR (Flu A&B, Covid) Nasopharyngeal Swab     Status: Abnormal   Collection Time: 02/08/21  2:08 PM   Specimen: Nasopharyngeal Swab; Nasopharyngeal(NP) swabs in vial transport medium  Result Value Ref Range Status   SARS Coronavirus 2 by RT PCR POSITIVE (A) NEGATIVE Final    Comment:  RESULT CALLED TO, READ BACK BY AND VERIFIED WITH: B,WRIGHT '@1613'$  02/08/21 EB (NOTE) SARS-CoV-2 target nucleic acids are DETECTED.  The SARS-CoV-2 RNA is generally detectable in upper respiratory specimens during the acute phase of infection. Positive results are indicative of the presence of the identified virus, but do not rule out bacterial infection or co-infection with other pathogens not detected by the test. Clinical correlation with patient history and other diagnostic information is necessary to determine patient infection status. The expected result is Negative.  Fact Sheet for Patients: EntrepreneurPulse.com.au  Fact Sheet for Healthcare Providers: IncredibleEmployment.be  This test is not yet approved or cleared by the [place] FDA and  has been authorized for detection and/or diagnosis of SARS-CoV-2 by FDA under an Emergency Use Authorization (EUA).  This EUA will remain in effect (meaning this test can be used) f or the duration of  the COVID-19 declaration under Section 564(b)(1) of the Act, 21 U.S.C. section 360bbb-3(b)(1), unless the authorization is terminated or revoked sooner.     Influenza A by PCR NEGATIVE NEGATIVE Final    Influenza B by PCR NEGATIVE NEGATIVE Final    Comment: (NOTE) The Xpert Xpress SARS-CoV-2/FLU/RSV plus assay is intended as an aid in the diagnosis of influenza from Nasopharyngeal swab specimens and should not be used as a sole basis for treatment. Nasal washings and aspirates are unacceptable for Xpert Xpress SARS-CoV-2/FLU/RSV testing.  Fact Sheet for Patients: EntrepreneurPulse.com.au  Fact Sheet for Healthcare Providers: IncredibleEmployment.be  This test is not yet approved or cleared by the [place] FDA and has been authorized for detection and/or diagnosis of SARS-CoV-2 by FDA under an Emergency Use Authorization (EUA). This EUA will remain in effect (meaning this test can be used) for the duration of the COVID-19 declaration under Section 564(b)(1) of the Act, 21 U.S.C. section 360bbb-3(b)(1), unless the authorization is terminated or revoked.  Performed at [facility], [place] [address], [place], [place] [address]          Radiology Studies: No results found.      Scheduled Meds:  atorvastatin  40 mg Oral Daily   carvedilol  25 mg Oral BID WC   Chlorhexidine Gluconate Cloth  6 each Topical Q0600   darbepoetin (ARANESP) injection - DIALYSIS  60 mcg Intravenous Q Sat-HD   doxercalciferol  5 mcg Intravenous Q M,W,F-HD   doxycycline  100 mg Oral Q12H   feeding supplement ([name] CARB STEADY)  237 mL Oral TID BM   heparin  5,000 Units Subcutaneous Q12H   imipramine  25 mg Oral QHS   insulin aspart  0-5 Units Subcutaneous QHS   insulin aspart  0-6 Units Subcutaneous TID WC   insulin aspart  4 Units Subcutaneous TID WC   insulin glargine-yfgn  18 Units Subcutaneous QHS   labetalol  10 mg Intravenous Once   loratadine  10 mg Oral Daily   losartan  50 mg Oral QHS   multivitamin  1 tablet Oral QHS   sevelamer carbonate  1,600 mg Oral TID WC   sodium chloride flush  3 mL Intravenous Q12H   torsemide  40 mg Oral Q  T,Th,S,Su   Continuous Infusions:  sodium chloride     promethazine (PHENERGAN) injection (IM or IVPB)       LOS: 8 days    Time spent: 25 mins.More than 50% of that time was spent in counseling and/or coordination of care.      Shelly Coss, MD [facility] P8/02/2021, 1:23 PM

## 2021-02-17 DIAGNOSIS — R569 Unspecified convulsions: Secondary | ICD-10-CM | POA: Diagnosis not present

## 2021-02-17 LAB — GLUCOSE, CAPILLARY
Glucose-Capillary: 148 mg/dL — ABNORMAL HIGH (ref 70–99)
Glucose-Capillary: 163 mg/dL — ABNORMAL HIGH (ref 70–99)

## 2021-02-17 LAB — RENAL FUNCTION PANEL
Albumin: 2.8 g/dL — ABNORMAL LOW (ref 3.5–5.0)
Anion gap: 12 (ref 5–15)
BUN: 85 mg/dL — ABNORMAL HIGH (ref 6–20)
CO2: 20 mmol/L — ABNORMAL LOW (ref 22–32)
Calcium: 8.1 mg/dL — ABNORMAL LOW (ref 8.9–10.3)
Chloride: 93 mmol/L — ABNORMAL LOW (ref 98–111)
Creatinine, Ser: 11.87 mg/dL — ABNORMAL HIGH (ref 0.61–1.24)
GFR, Estimated: 5 mL/min — ABNORMAL LOW (ref >60)
Glucose, Bld: 223 mg/dL — ABNORMAL HIGH (ref 70–99)
Phosphorus: 5.3 mg/dL — ABNORMAL HIGH (ref 2.5–4.6)
Potassium: 4.7 mmol/L (ref 3.5–5.1)
Sodium: 125 mmol/L — ABNORMAL LOW (ref 135–145)

## 2021-02-17 LAB — CBC
HCT: 30.2 % — ABNORMAL LOW (ref 39.0–52.0)
Hemoglobin: 9.7 g/dL — ABNORMAL LOW (ref 13.0–17.0)
MCH: 26.4 pg (ref 26.0–34.0)
MCHC: 32.1 g/dL (ref 30.0–36.0)
MCV: 82.1 fL (ref 80.0–100.0)
Platelets: 295 10*3/uL (ref 150–400)
RBC: 3.68 MIL/uL — ABNORMAL LOW (ref 4.22–5.81)
RDW: 17.9 % — ABNORMAL HIGH (ref 11.5–15.5)
WBC: 5.4 10*3/uL (ref 4.0–10.5)
nRBC: 0 % (ref 0.0–0.2)

## 2021-02-17 MED ORDER — DOXERCALCIFEROL 4 MCG/2ML IV SOLN
5.0000 ug | INTRAVENOUS | Status: DC
  Filled 2021-02-17: qty 4

## 2021-02-17 NOTE — Progress Notes (Signed)
[facility] Progress Note  Subjective: pt seen in room, main c/o is itching. Does have some nasal congestion, minimal SOB.   Vitals:   02/16/21 2050 02/16/21 2353 02/17/21 0402 02/17/21 0500  BP: (!) 150/94 (!) 154/94 131/81   Pulse: 79 79 72   Resp: '16 20 20   '$ Temp: 98.2 F (36.8 C) 98.3 F (36.8 C) 98.1 F (36.7 C)   TempSrc: Oral Oral Oral   SpO2: 100% 100% 99%   Weight:    97.2 kg  Height:        Exam:  alert, nad   no jvd  Chest cta bilat  Cor reg no RG  Abd soft ntnd no ascites   Ext no LE edema   Alert, NF, ox3  LFA AVF +bruit     OP HD: [facility] MWF (TTS here)  4h  450/800  86.5kg  2/2 bath  L AVF  Hep none -[name] 5 mcg IV TIW -Venofer 100 mcg IV X 10 doses 3/7 doses given last dose 02/04/21 -Mircera 225 mcg IV q 2 weeks (last dose 02/02/2021)  Assessment/ Plan  HHS: sp IV insulin, back on SQ insulin now AMS: Thought to be related to elevated glucose +/- seizure. CT of head without acute changes/MRI neg as well. Neuro involved no anti sz meds req COVID 19- Per primary-  on RA at 100%, no medical treatment required Hypertensive Emergency: Medications and EDW adjusted at OP HD center last week. improved. UF with HD and continue to lower volume as tolerated. Coreg 12.5 BID-   cozaar 50 Torsemide 20 mg 2 tabs on non HD days.  BP's slightly high, but better. Suspect noncompliance with meds as OP ESRD -  regular HD is MWF. Getting HD today on TTS schedule to help balance pt load.   R toe ulcer: On Doxycycline, WC consult per primary   Hypertension/volume  - up 9kg by wts, doubt they are accurate. Remains on RA.   Anemia  - HGB 9.7--9.1. ESA here weekly   Metabolic bone disease -  C Ca OK, PO4 elevated. Increase Renvela to 2 tabs PO TID AC-  phos better. Continue VDRA.   Nutrition -Albumin 2.5. Renal Carb mod diet, add nepro, renal vits. Disposition -  will likely need placement since his aunt can no longer care for him.  SW/CM involved and speaking with  family and pt.     Rob Sirena Riddle 02/17/2021, 9:12 AM   Recent Labs  Lab 02/13/21 0332 02/14/21 0317 02/15/21 0537 02/16/21 0654  K 4.2   < > 4.3 4.4  BUN 43*   < > 44* 65*  CREATININE 8.25*   < > 7.78* 9.99*  CALCIUM 7.8*   < > 7.8* 8.1*  PHOS 4.5  --   --  4.9*  HGB 10.0*  --   --  10.1*   < > = values in this interval not displayed.   Inpatient medications:  atorvastatin  40 mg Oral Daily   carvedilol  25 mg Oral BID WC   Chlorhexidine Gluconate Cloth  6 each Topical Q0600   [START ON 02/23/2021] darbepoetin (ARANESP) injection - DIALYSIS  60 mcg Intravenous Q Mon-HD   doxercalciferol  5 mcg Intravenous Q M,W,F-HD   doxycycline  100 mg Oral Q12H   feeding supplement ([name] CARB STEADY)  237 mL Oral TID BM   heparin  5,000 Units Subcutaneous Q12H   imipramine  25 mg Oral QHS   insulin aspart  0-5 Units  Subcutaneous QHS   insulin aspart  0-6 Units Subcutaneous TID WC   insulin aspart  4 Units Subcutaneous TID WC   insulin glargine-yfgn  18 Units Subcutaneous QHS   labetalol  10 mg Intravenous Once   loratadine  10 mg Oral Daily   losartan  50 mg Oral QHS   multivitamin  1 tablet Oral QHS   sevelamer carbonate  1,600 mg Oral TID WC   sodium chloride flush  3 mL Intravenous Q12H   torsemide  40 mg Oral Q T,Th,S,Su    sodium chloride     promethazine (PHENERGAN) injection (IM or IVPB)     sodium chloride, acetaminophen, butalbital-acetaminophen-caffeine, dextrose, hydrALAZINE, hydrOXYzine, promethazine (PHENERGAN) injection (IM or IVPB), sodium chloride flush

## 2021-02-17 NOTE — Progress Notes (Signed)
PROGRESS NOTE    [name]  [id number] DOB: 04/05/1986 DOA: 02/08/2021 PCP: Gildardo Pounds, NP   Chief Complain: Seizures  Brief Narrative:  Patient is a 35 year old male with history of ESRD on dialysis,IDDM on insulin, HTN, HLD, legally blind, presented with syncope versus seizure.  Blood glucose were severely elevated on presentation.  He was thought to have seizures from elevated blood glucose levels.  Found to have incidental positive for COVID, no respiratory issues.  He used to live with his aunt but she is unable to take him back home.  TOC on board and awaiting for disposition plan.  Medically stable for discharge whenever possible.  Assessment & Plan:   Principal Problem:   Seizure-like activity ([place]) Active Problems:   Essential hypertension   DM2 (diabetes mellitus, type 2) ([facility])   Anemia of chronic kidney failure   Diabetic foot ulcer ([place])   Syncope   DKA (diabetic ketoacidosis) ([place])   Acute metabolic encephalopathy: Initially thought to have seizure from hyperglycemia.  EEG with questionable dysfunction in the left temporal region.  MRI was negative.  Encephalopathy has resolved.  Currently alert and oriented.  Patient is legally blind.  Hyperglycemia/insulin-dependent diabetes type 2: Elevated blood glucose on presentation. Monitor blood sugars, Continue current insulin regimen.  Diabetic coordinator following.  Incidental COVID-positive: Asymptomatic.  Initial chest x-ray was consistent with volume overload.  Received 1 dose of monoclonal antibody on admission.  ESRD on dialysis: Nephrology following.  Volume management as per dialysis.  Chronic combined congestive heart failure: Echo obtained during this hospitalization showed EF of 45%, needs outpatient cardiology follow-up.  Volume management as per dialysis  Right toe ulcer: On doxycycline ,follows with  podiatry.  Recommend outpatient follow-up.  Hypertension: Continue current medications.   Monitor blood pressure.  Disposition: Patient is medically stable for discharge.  Previously was living with aunt but she is not able to take him back.  Disposition issue.  TOC following        Nutrition Problem: Increased nutrient needs Etiology: chronic illness (ESRD on HD)      DVT prophylaxis:Heparin [place] Code Status: Full Family Communication: None at bedside Status is: Inpatient  Remains inpatient appropriate because:Inpatient level of care appropriate due to severity of illness  Dispo: The patient is from: Home              Anticipated d/c is to: Home              Patient currently is medically stable to d/c.   Difficult to place patient Yes     Consultants: Nephrology  Procedures:Dialysis  Antimicrobials:  Anti-infectives (From admission, onward)    Start     Dose/Rate Route Frequency Ordered Stop   02/08/21 2200  doxycycline (VIBRA-TABS) tablet 100 mg        100 mg Oral Every 12 hours 02/08/21 1718 02/21/21 2359       Subjective:  Patient seen and examined at the bedside this morning.  Hemodynamically stable.  Comfortable.  No new complaints.  Asking for breakfast   Objective: Vitals:   02/16/21 2050 02/16/21 2353 02/17/21 0402 02/17/21 0500  BP: (!) 150/94 (!) 154/94 131/81   Pulse: 79 79 72   Resp: '16 20 20   '$ Temp: 98.2 F (36.8 C) 98.3 F (36.8 C) 98.1 F (36.7 C)   TempSrc: Oral Oral Oral   SpO2: 100% 100% 99%   Weight:    97.2 kg  Height:  No intake or output data in the 24 hours ending 02/17/21 0747 Filed Weights   02/15/21 0425 02/16/21 0548 02/17/21 0500  Weight: 93 kg 95.8 kg 97.2 kg    Examination:   General exam: Overall comfortable, not in distress HEENT: Legally blind Respiratory system:  no wheezes or crackles  Cardiovascular system: S1 & S2 heard, RRR.  Gastrointestinal system: Abdomen is nondistended, soft and nontender. Central nervous system: Alert and oriented Extremities: No edema, no clubbing ,no  cyanosis, dialysis access on the left forearm, ulceration of the right middle toe Skin: No rashes, no icterus     Data Reviewed: I have personally reviewed following labs and imaging studies  CBC: Recent Labs  Lab 02/11/21 0210 02/12/21 0509 02/13/21 0332 02/16/21 0654  WBC 6.6 6.2 5.7 5.7  NEUTROABS 5.2 4.5 3.8  --   HGB 10.1* 9.9* 10.0* 10.1*  HCT 32.8* 31.8* 31.5* 31.3*  MCV 83.5 83.0 82.5 81.3  PLT 382 348 334 [id number]   Basic Metabolic Panel: Recent Labs  Lab 02/11/21 0210 02/12/21 0509 02/13/21 0332 02/14/21 0317 02/15/21 0537 02/16/21 0654  NA 132* 126* 129* 123* 124* 125*  K 4.2 4.3 4.2 4.4 4.3 4.4  CL 94* 88* 93* 90* 91* 90*  CO2 '26 22 27 22 25 23  '$ GLUCOSE 357* 266* 260* 391* 411* 366*  BUN 52* 63* 43* 62* 44* 65*  CREATININE 10.07* 11.37* 8.25* 9.72* 7.78* 9.99*  CALCIUM 7.6* 7.9* 7.8* 7.7* 7.8* 8.1*  MG 2.0 2.4 2.1  --   --   --   PHOS 5.4* 5.8* 4.5  --   --  4.9*   GFR: Estimated Creatinine Clearance: 12.3 mL/min (A) (by C-G formula based on SCr of 9.99 mg/dL (H)). Liver Function Tests: Recent Labs  Lab 02/11/21 0210 02/12/21 0509 02/13/21 0332 02/16/21 0654  AST 15 12* 16  --   ALT '25 20 21  '$ --   ALKPHOS 143* 130* 120  --   BILITOT 1.1 0.7 0.8  --   PROT 6.5 6.1* 5.8*  --   ALBUMIN 2.8* 2.7* 2.5* 2.7*   No results for input(s): LIPASE, AMYLASE in the last 168 hours. No results for input(s): AMMONIA in the last 168 hours. Coagulation Profile: No results for input(s): INR, PROTIME in the last 168 hours. Cardiac Enzymes: No results for input(s): CKTOTAL, CKMB, CKMBINDEX, TROPONINI in the last 168 hours. BNP (last 3 results) No results for input(s): PROBNP in the last 8760 hours. HbA1C: No results for input(s): HGBA1C in the last 72 hours. CBG: Recent Labs  Lab 02/16/21 0857 02/16/21 1220 02/16/21 1626 02/16/21 2216 02/17/21 0620  GLUCAP 357* 331* 186* 209* 148*   Lipid Profile: No results for input(s): CHOL, HDL, LDLCALC, TRIG,  CHOLHDL, LDLDIRECT in the last 72 hours. Thyroid Function Tests: No results for input(s): TSH, T4TOTAL, FREET4, T3FREE, THYROIDAB in the last 72 hours. Anemia Panel: No results for input(s): VITAMINB12, FOLATE, FERRITIN, TIBC, IRON, RETICCTPCT in the last 72 hours. Sepsis Labs: Recent Labs  Lab 02/11/21 0210 02/12/21 0509  PROCALCITON 1.33 0.84    Recent Results (from the past 240 hour(s))  Resp Panel by RT-PCR (Flu A&B, Covid) Nasopharyngeal Swab     Status: Abnormal   Collection Time: 02/08/21  2:08 PM   Specimen: Nasopharyngeal Swab; Nasopharyngeal(NP) swabs in vial transport medium  Result Value Ref Range Status   SARS Coronavirus 2 by RT PCR POSITIVE (A) NEGATIVE Final    Comment: RESULT CALLED TO, READ BACK BY AND VERIFIED  WITH: B,WRIGHT '@1613'$  02/08/21 EB (NOTE) SARS-CoV-2 target nucleic acids are DETECTED.  The SARS-CoV-2 RNA is generally detectable in upper respiratory specimens during the acute phase of infection. Positive results are indicative of the presence of the identified virus, but do not rule out bacterial infection or co-infection with other pathogens not detected by the test. Clinical correlation with patient history and other diagnostic information is necessary to determine patient infection status. The expected result is Negative.  Fact Sheet for Patients: EntrepreneurPulse.com.au  Fact Sheet for Healthcare Providers: IncredibleEmployment.be  This test is not yet approved or cleared by the [place] FDA and  has been authorized for detection and/or diagnosis of SARS-CoV-2 by FDA under an Emergency Use Authorization (EUA).  This EUA will remain in effect (meaning this test can be used) f or the duration of  the COVID-19 declaration under Section 564(b)(1) of the Act, 21 U.S.C. section 360bbb-3(b)(1), unless the authorization is terminated or revoked sooner.     Influenza A by PCR NEGATIVE NEGATIVE Final    Influenza B by PCR NEGATIVE NEGATIVE Final    Comment: (NOTE) The Xpert Xpress SARS-CoV-2/FLU/RSV plus assay is intended as an aid in the diagnosis of influenza from Nasopharyngeal swab specimens and should not be used as a sole basis for treatment. Nasal washings and aspirates are unacceptable for Xpert Xpress SARS-CoV-2/FLU/RSV testing.  Fact Sheet for Patients: EntrepreneurPulse.com.au  Fact Sheet for Healthcare Providers: IncredibleEmployment.be  This test is not yet approved or cleared by the [place] FDA and has been authorized for detection and/or diagnosis of SARS-CoV-2 by FDA under an Emergency Use Authorization (EUA). This EUA will remain in effect (meaning this test can be used) for the duration of the COVID-19 declaration under Section 564(b)(1) of the Act, 21 U.S.C. section 360bbb-3(b)(1), unless the authorization is terminated or revoked.  Performed at [facility], [place] [address] [place], [place] [address]          Radiology Studies: No results found.      Scheduled Meds:  atorvastatin  40 mg Oral Daily   carvedilol  25 mg Oral BID WC   Chlorhexidine Gluconate Cloth  6 each Topical Q0600   [START ON 02/23/2021] darbepoetin (ARANESP) injection - DIALYSIS  60 mcg Intravenous Q Mon-HD   doxercalciferol  5 mcg Intravenous Q M,W,F-HD   doxycycline  100 mg Oral Q12H   feeding supplement ([name] CARB STEADY)  237 mL Oral TID BM   heparin  5,000 Units Subcutaneous Q12H   imipramine  25 mg Oral QHS   insulin aspart  0-5 Units Subcutaneous QHS   insulin aspart  0-6 Units Subcutaneous TID WC   insulin aspart  4 Units Subcutaneous TID WC   insulin glargine-yfgn  18 Units Subcutaneous QHS   labetalol  10 mg Intravenous Once   loratadine  10 mg Oral Daily   losartan  50 mg Oral QHS   multivitamin  1 tablet Oral QHS   sevelamer carbonate  1,600 mg Oral TID WC   sodium chloride flush  3 mL Intravenous Q12H    torsemide  40 mg Oral Q T,Th,S,Su   Continuous Infusions:  sodium chloride     promethazine (PHENERGAN) injection (IM or IVPB)       LOS: 9 days    Time spent: 25 mins.More than 50% of that time was spent in counseling and/or coordination of care.      Shelly Coss, MD [facility] P8/03/2021, 7:47 AM

## 2021-02-17 NOTE — Progress Notes (Signed)
Nutrition Follow-up  DOCUMENTATION CODES:   Not applicable  INTERVENTION:  -Continue [name] Shake po TID, each supplement provides 425 kcal and 19 grams protein -Continue renal mvi daily  NUTRITION DIAGNOSIS:   Increased nutrient needs related to chronic illness (ESRD on HD) as evidenced by estimated needs. -- ongoing  GOAL:   Patient will meet greater than or equal to 90% of their needs -- progressing  MONITOR:   PO intake, Weight trends, Supplement acceptance, Labs, I & O's  REASON FOR ASSESSMENT:   Consult Assessment of nutrition requirement/status, Other (Comment) (MD consult says "call pt's caregiver/aunt about his diet")  ASSESSMENT:   Pt with PMH significant for ESRD on HD, IDDM off insulin, HTN, HLD, and legally blind presented with AMS 2/2 HHS/DKA with coma vs syncope vs seizure. Incidental finding of COVID+.  Per MD, pt is medically stable for discharge whenever possible. Pt will likely need placement as his aunt is no longer able to care for him. RD attempted to follow-up with pt; however, pt unavailable at time of RD visit x2 attempts. Per RN, pt has had no complaints and has a good appetite. 100% meal intake x last 8 documented meals.  No UOP documented x24 hours  Last HD 8/6, net UF 4L  EDW: 86.5 kg Current weight: 97.2 kg Admit weight: 93.4 kg  Medications: aranesp, nepro shakes TID, hectorol, SSI, novolog, semglee, rena-vit, renvela, IV phenergan Labs: Na 125 (L), Cr 9.99 (H), PO4 4.9 (H) CBGs: [id number]  Diet Order:   Diet Order             Diet renal/carb modified with fluid restriction Diet-HS Snack? Nothing; Fluid restriction: 1200 mL Fluid; Room service appropriate? No; Fluid consistency: Thin  Diet effective now                   EDUCATION NEEDS:   Education needs have been addressed  Skin:  Skin Assessment: Reviewed RN Assessment (non-pressure wounds noted on toes)  Last BM:  8/8  Height:   Ht Readings from Last 1 Encounters:   02/08/21 6' (1.829 m)    Weight:   Wt Readings from Last 1 Encounters:  02/17/21 97.2 kg    BMI:  Body mass index is 29.06 kg/m.  Estimated Nutritional Needs:   Kcal:  2150-2350  Protein:  105-120 grams  Fluid:  1L+UOP    Larkin Ina, MS, RD, LDN (she/her/hers) RD pager number and weekend/on-call pager number located in [place].

## 2021-02-18 DIAGNOSIS — R569 Unspecified convulsions: Secondary | ICD-10-CM | POA: Diagnosis not present

## 2021-02-18 LAB — RENAL FUNCTION PANEL
Albumin: 2.8 g/dL — ABNORMAL LOW (ref 3.5–5.0)
Anion gap: 10 (ref 5–15)
BUN: 51 mg/dL — ABNORMAL HIGH (ref 6–20)
CO2: 25 mmol/L (ref 22–32)
Calcium: 8.1 mg/dL — ABNORMAL LOW (ref 8.9–10.3)
Chloride: 91 mmol/L — ABNORMAL LOW (ref 98–111)
Creatinine, Ser: 8.22 mg/dL — ABNORMAL HIGH (ref 0.61–1.24)
GFR, Estimated: 8 mL/min — ABNORMAL LOW (ref >60)
Glucose, Bld: 392 mg/dL — ABNORMAL HIGH (ref 70–99)
Phosphorus: 4 mg/dL (ref 2.5–4.6)
Potassium: 4.2 mmol/L (ref 3.5–5.1)
Sodium: 126 mmol/L — ABNORMAL LOW (ref 135–145)

## 2021-02-18 LAB — GLUCOSE, CAPILLARY
Glucose-Capillary: 278 mg/dL — ABNORMAL HIGH (ref 70–99)
Glucose-Capillary: 100 mg/dL — ABNORMAL HIGH (ref 70–99)
Glucose-Capillary: 109 mg/dL — ABNORMAL HIGH (ref 70–99)
Glucose-Capillary: 219 mg/dL — ABNORMAL HIGH (ref 70–99)

## 2021-02-18 NOTE — Progress Notes (Signed)
[facility] Progress Note  Subjective: Patient not seen directly today given COVID-19 + status, utilizing data taken from chart +/- discussions w/ providers and staff.    Vitals:   02/17/21 2000 02/18/21 0009 02/18/21 0500 02/18/21 0737  BP: (!) 180/107 (!) 163/100  137/87  Pulse: 79 80  81  Resp: '18 19  18  '$ Temp: 98.8 F (37.1 C) 98.8 F (37.1 C)  98.2 F (36.8 C)  TempSrc: Oral Oral  Oral  SpO2: 100% 99%  98%  Weight:   97 kg   Height:        Exam:  Patient not seen directly today given COVID-19 + status, utilizing data taken from chart +/- discussions w/ providers and staff.       OP HD: SW MWF  4h  450/800  86.5kg  2/2 bath  L AVF  Hep none -[name] 5 mcg IV TIW -Venofer 100 mcg IV X 10 doses 3/7 doses given last dose 02/04/21 -Mircera 225 mcg IV q 2 weeks (last dose 02/02/2021)  Assessment/ Plan  HHS: sp IV insulin, back on SQ insulin now AMS: resolved, thought to be related to elevated glucose +/- seizure. CT of head without acute changes/MRI neg as well. Neuro involved no anti sz meds req COVID 19- Per primary-  on RA at 100%, no medical treatment required. Pt is now off of COVID isolation. ESRD -  regular HD is MWF. Had HD here yesterday off schedule. Due to staffing issues we are doing HD just 2 times this week when possible. Next HD Friday.  R toe ulcer: On Doxycycline, WC consult per primary  Hypertensive Emergency: Medications and EDW adjusted at OP HD center last week. improved. UF with HD and continue to lower volume as tolerated. Coreg 12.5 BID-   cozaar 50 Torsemide 20 mg 2 tabs on non HD days.  BP's slightly high, but better. Suspect noncompliance with meds as OP  Hypertension/volume  - up 9kg by wts, doubt they are accurate. No vol excess on exam. Remains on RA.   Anemia  - HGB 9.7--9.1. ESA here weekly   Metabolic bone disease -  C Ca OK, PO4 elevated. Increase Renvela to 2 tabs PO TID AC-  phos better. Continue VDRA.   Nutrition -Albumin  2.5. Renal Carb mod diet, add nepro, renal vits. Disposition -  will likely need SNF placement since his aunt can no longer care for him.  SW/CM involved and speaking with family and pt.     [name] 02/18/2021, 9:41 AM   Recent Labs  Lab 02/16/21 0654 02/17/21 1622 02/17/21 1623 02/18/21 0259  K 4.4  --  4.7 4.2  BUN 65*  --  85* 51*  CREATININE 9.99*  --  11.87* 8.22*  CALCIUM 8.1*  --  8.1* 8.1*  PHOS 4.9*  --  5.3* 4.0  HGB 10.1* 9.7*  --   --     Inpatient medications:  atorvastatin  40 mg Oral Daily   carvedilol  25 mg Oral BID WC   Chlorhexidine Gluconate Cloth  6 each Topical Q0600   [START ON 02/23/2021] darbepoetin (ARANESP) injection - DIALYSIS  60 mcg Intravenous Q Mon-HD   doxercalciferol  5 mcg Intravenous Q T,Th,Sa-HD   doxycycline  100 mg Oral Q12H   feeding supplement ([name] CARB STEADY)  237 mL Oral TID BM   heparin  5,000 Units Subcutaneous Q12H   imipramine  25 mg Oral QHS   insulin aspart  0-5 Units  Subcutaneous QHS   insulin aspart  0-6 Units Subcutaneous TID WC   insulin aspart  4 Units Subcutaneous TID WC   insulin glargine-yfgn  18 Units Subcutaneous QHS   labetalol  10 mg Intravenous Once   loratadine  10 mg Oral Daily   losartan  50 mg Oral QHS   multivitamin  1 tablet Oral QHS   sevelamer carbonate  1,600 mg Oral TID WC   sodium chloride flush  3 mL Intravenous Q12H    sodium chloride     promethazine (PHENERGAN) injection (IM or IVPB)     sodium chloride, acetaminophen, butalbital-acetaminophen-caffeine, dextrose, hydrALAZINE, hydrOXYzine, promethazine (PHENERGAN) injection (IM or IVPB), sodium chloride flush

## 2021-02-18 NOTE — Progress Notes (Signed)
PROGRESS NOTE    [name]  [id number] DOB: 31-Mar-1986 DOA: 02/08/2021 PCP: Gildardo Pounds, NP   Chief Complain: Seizures  Brief Narrative:  Patient is a 35 year old male with history of ESRD on dialysis,IDDM on insulin, HTN, HLD, legally blind, presented with syncope versus seizure.  Blood glucose were severely elevated on presentation.  He was thought to have seizures from elevated blood glucose levels.  Found to have incidental positive for COVID, no respiratory issues.  He used to live with his aunt but she is unable to take him back home.  TOC on board and awaiting for disposition plan.  Medically stable for discharge whenever possible.  Assessment & Plan:   Principal Problem:   Seizure-like activity ([place]) Active Problems:   Essential hypertension   DM2 (diabetes mellitus, type 2) (HCC)   Anemia of chronic kidney failure   Diabetic foot ulcer ([place])   Syncope   DKA (diabetic ketoacidosis) ([place])   Acute metabolic encephalopathy: Initially thought to have seizure from hyperglycemia.  EEG with questionable dysfunction in the left temporal region.  MRI was negative.  Encephalopathy has resolved.  Currently alert and oriented.  Patient is legally blind.  Hyperglycemia/insulin-dependent diabetes type 2: Elevated blood glucose on presentation. Monitor blood sugars, Continue current insulin regimen.  Diabetic coordinator following.  Incidental COVID-positive: Asymptomatic.  Initial chest x-ray was consistent with volume overload.  Received 1 dose of monoclonal antibody on admission.  ESRD on dialysis: Nephrology following.  Volume management as per dialysis.  Chronic combined congestive heart failure: Echo obtained during this hospitalization showed EF of 45%, needs outpatient cardiology follow-up.  Volume management as per dialysis  Right toe ulcer: On doxycycline ,follows with  podiatry.  Plan for 14 days course.Recommend outpatient follow-up.  Hypertension: Continue  current medications.  Monitor blood pressure.  Disposition: Patient is medically stable for discharge.  Previously was living with aunt but she is not able to take him back.  Disposition issue.  TOC following        Nutrition Problem: Increased nutrient needs Etiology: chronic illness (ESRD on HD)      DVT prophylaxis:Heparin [place] Code Status: Full Family Communication: None at bedside Status is: Inpatient  Remains inpatient appropriate because:Inpatient level of care appropriate due to severity of illness  Dispo: The patient is from: Home              Anticipated d/c is to: Home              Patient currently is medically stable to d/c.   Difficult to place patient Yes     Consultants: Nephrology  Procedures:Dialysis  Antimicrobials:  Anti-infectives (From admission, onward)    Start     Dose/Rate Route Frequency Ordered Stop   02/08/21 2200  doxycycline (VIBRA-TABS) tablet 100 mg        100 mg Oral Every 12 hours 02/08/21 1718 02/21/21 2359       Subjective:  Patient seen and examined the bedside today.  Denies any complaints.   Objective: Vitals:   02/17/21 2000 02/18/21 0009 02/18/21 0500 02/18/21 0737  BP: (!) 180/107 (!) 163/100  137/87  Pulse: 79 80  81  Resp: '18 19  18  '$ Temp: 98.8 F (37.1 C) 98.8 F (37.1 C)  98.2 F (36.8 C)  TempSrc: Oral Oral  Oral  SpO2: 100% 99%  98%  Weight:   97 kg   Height:        Intake/Output Summary (Last 24 hours) at  02/18/2021 0741 Last data filed at 02/17/2021 1230 Gross per 24 hour  Intake 600 ml  Output --  Net 600 ml   Filed Weights   02/17/21 0500 02/17/21 1520 02/18/21 0500  Weight: 97.2 kg 99.3 kg 97 kg    Examination:   General exam: Overall comfortable, not in distress HEENT: PERRL,legally blind Respiratory system:  no wheezes or crackles  Cardiovascular system: S1 & S2 heard, RRR.  Gastrointestinal system: Abdomen is nondistended, soft and nontender. Central nervous system: Alert and  oriented Extremities: No edema, no clubbing ,no cyanosis, ulcer on the right middle toe Skin: No rashes, no ulcers,no icterus    Data Reviewed: I have personally reviewed following labs and imaging studies  CBC: Recent Labs  Lab 02/12/21 0509 02/13/21 0332 02/16/21 0654 02/17/21 1622  WBC 6.2 5.7 5.7 5.4  NEUTROABS 4.5 3.8  --   --   HGB 9.9* 10.0* 10.1* 9.7*  HCT 31.8* 31.5* 31.3* 30.2*  MCV 83.0 82.5 81.3 82.1  PLT 348 334 281 [id number]   Basic Metabolic Panel: Recent Labs  Lab 02/12/21 0509 02/13/21 0332 02/14/21 0317 02/15/21 0537 02/16/21 0654 02/17/21 1623 02/18/21 0259  NA 126* 129* 123* 124* 125* 125* 126*  K 4.3 4.2 4.4 4.3 4.4 4.7 4.2  CL 88* 93* 90* 91* 90* 93* 91*  CO2 '22 27 22 25 23 '$ 20* 25  GLUCOSE 266* 260* 391* 411* 366* 223* 392*  BUN 63* 43* 62* 44* 65* 85* 51*  CREATININE 11.37* 8.25* 9.72* 7.78* 9.99* 11.87* 8.22*  CALCIUM 7.9* 7.8* 7.7* 7.8* 8.1* 8.1* 8.1*  MG 2.4 2.1  --   --   --   --   --   PHOS 5.8* 4.5  --   --  4.9* 5.3* 4.0   GFR: Estimated Creatinine Clearance: 15 mL/min (A) (by C-G formula based on SCr of 8.22 mg/dL (H)). Liver Function Tests: Recent Labs  Lab 02/12/21 0509 02/13/21 0332 02/16/21 0654 02/17/21 1623 02/18/21 0259  AST 12* 16  --   --   --   ALT 20 21  --   --   --   ALKPHOS 130* 120  --   --   --   BILITOT 0.7 0.8  --   --   --   PROT 6.1* 5.8*  --   --   --   ALBUMIN 2.7* 2.5* 2.7* 2.8* 2.8*   No results for input(s): LIPASE, AMYLASE in the last 168 hours. No results for input(s): AMMONIA in the last 168 hours. Coagulation Profile: No results for input(s): INR, PROTIME in the last 168 hours. Cardiac Enzymes: No results for input(s): CKTOTAL, CKMB, CKMBINDEX, TROPONINI in the last 168 hours. BNP (last 3 results) No results for input(s): PROBNP in the last 8760 hours. HbA1C: No results for input(s): HGBA1C in the last 72 hours. CBG: Recent Labs  Lab 02/16/21 1220 02/16/21 1626 02/16/21 2216 02/17/21 0620  02/17/21 1233  GLUCAP 331* 186* 209* 148* 163*   Lipid Profile: No results for input(s): CHOL, HDL, LDLCALC, TRIG, CHOLHDL, LDLDIRECT in the last 72 hours. Thyroid Function Tests: No results for input(s): TSH, T4TOTAL, FREET4, T3FREE, THYROIDAB in the last 72 hours. Anemia Panel: No results for input(s): VITAMINB12, FOLATE, FERRITIN, TIBC, IRON, RETICCTPCT in the last 72 hours. Sepsis Labs: Recent Labs  Lab 02/12/21 0509  PROCALCITON 0.84    Recent Results (from the past 240 hour(s))  Resp Panel by RT-PCR (Flu A&B, Covid) Nasopharyngeal Swab     Status:  Abnormal   Collection Time: 02/08/21  2:08 PM   Specimen: Nasopharyngeal Swab; Nasopharyngeal(NP) swabs in vial transport medium  Result Value Ref Range Status   SARS Coronavirus 2 by RT PCR POSITIVE (A) NEGATIVE Final    Comment: RESULT CALLED TO, READ BACK BY AND VERIFIED WITH: B,WRIGHT '@1613'$  02/08/21 EB (NOTE) SARS-CoV-2 target nucleic acids are DETECTED.  The SARS-CoV-2 RNA is generally detectable in upper respiratory specimens during the acute phase of infection. Positive results are indicative of the presence of the identified virus, but do not rule out bacterial infection or co-infection with other pathogens not detected by the test. Clinical correlation with patient history and other diagnostic information is necessary to determine patient infection status. The expected result is Negative.  Fact Sheet for Patients: EntrepreneurPulse.com.au  Fact Sheet for Healthcare Providers: IncredibleEmployment.be  This test is not yet approved or cleared by the [place] FDA and  has been authorized for detection and/or diagnosis of SARS-CoV-2 by FDA under an Emergency Use Authorization (EUA).  This EUA will remain in effect (meaning this test can be used) f or the duration of  the COVID-19 declaration under Section 564(b)(1) of the Act, 21 U.S.C. section 360bbb-3(b)(1), unless the  authorization is terminated or revoked sooner.     Influenza A by PCR NEGATIVE NEGATIVE Final   Influenza B by PCR NEGATIVE NEGATIVE Final    Comment: (NOTE) The Xpert Xpress SARS-CoV-2/FLU/RSV plus assay is intended as an aid in the diagnosis of influenza from Nasopharyngeal swab specimens and should not be used as a sole basis for treatment. Nasal washings and aspirates are unacceptable for Xpert Xpress SARS-CoV-2/FLU/RSV testing.  Fact Sheet for Patients: EntrepreneurPulse.com.au  Fact Sheet for Healthcare Providers: IncredibleEmployment.be  This test is not yet approved or cleared by the [place] FDA and has been authorized for detection and/or diagnosis of SARS-CoV-2 by FDA under an Emergency Use Authorization (EUA). This EUA will remain in effect (meaning this test can be used) for the duration of the COVID-19 declaration under Section 564(b)(1) of the Act, 21 U.S.C. section 360bbb-3(b)(1), unless the authorization is terminated or revoked.  Performed at [facility], [place] [address], [place], [place] [address]          Radiology Studies: No results found.      Scheduled Meds:  atorvastatin  40 mg Oral Daily   carvedilol  25 mg Oral BID WC   Chlorhexidine Gluconate Cloth  6 each Topical Q0600   [START ON 02/23/2021] darbepoetin (ARANESP) injection - DIALYSIS  60 mcg Intravenous Q Mon-HD   doxercalciferol  5 mcg Intravenous Q T,Th,Sa-HD   doxycycline  100 mg Oral Q12H   feeding supplement ([name] CARB STEADY)  237 mL Oral TID BM   heparin  5,000 Units Subcutaneous Q12H   imipramine  25 mg Oral QHS   insulin aspart  0-5 Units Subcutaneous QHS   insulin aspart  0-6 Units Subcutaneous TID WC   insulin aspart  4 Units Subcutaneous TID WC   insulin glargine-yfgn  18 Units Subcutaneous QHS   labetalol  10 mg Intravenous Once   loratadine  10 mg Oral Daily   losartan  50 mg Oral QHS   multivitamin  1 tablet Oral QHS    sevelamer carbonate  1,600 mg Oral TID WC   sodium chloride flush  3 mL Intravenous Q12H   Continuous Infusions:  sodium chloride     promethazine (PHENERGAN) injection (IM or IVPB)  LOS: 10 days    Time spent: 15 mins.More than 50% of that time was spent in counseling and/or coordination of care.      Shelly Coss, MD [facility] P8/04/2021, 7:41 AM

## 2021-02-18 NOTE — Progress Notes (Signed)
Inpatient Diabetes Program Recommendations  AACE/ADA: New Consensus Statement on Inpatient Glycemic Control  Target Ranges:  Prepandial:   less than 140 mg/dL      Peak postprandial:   less than 180 mg/dL (1-2 hours)      Critically ill patients:  140 - 180 mg/dL    Review of Glycemic Control  Results for [name], [name] (MRN [id number]) as of 02/18/2021 10:34  Ref. Range 02/17/2021 16:23 02/18/2021 02:59  Glucose Latest Ref Range: 70 - 99 mg/dL 223 (H) 392 (H)   Diabetes history: DM2 Outpatient Diabetes medications: Levemir 10 units daily (not taken in at least 4 months) Current orders for Inpatient glycemic control: Semglee 18 units QHS, Novlog 0-6 units TID with meals, Novolog 0-5 units QHS, Novolog 4 units TID with meals   [name] tid between meals  Inpatient Diabetes Program Recommendations:    -  Increase meal coverage to Novolog 5 units TID with meals for meal coverage.  Thanks, Tama Headings RN, MSN, BC-ADM Inpatient Diabetes Coordinator Team Pager [phone] (8a-5p)

## 2021-02-18 NOTE — Plan of Care (Signed)

## 2021-02-18 NOTE — TOC Progression Note (Signed)
Transition of Care [facility]) - Progression Note    Patient Details  Name: [name] MRN: [id number] Date of Birth: [birth year]  [facility] of [facility]) CM/SW Contact  Pollie Friar, RN Phone Number: 02/18/2021, 1:36 PM  Clinical Narrative:    [name] for group home admission. They are asking questions about his income/ disability. Per Aunt and pt he doesn't receive any disability. CM has left message for Ms Shanda Bumps with [name] to call CM . TOC following.   Expected Discharge Plan: [place] Barriers to Discharge: Continued Medical Work up  Expected Discharge Plan and Services Expected Discharge Plan: [place]   Discharge Planning Services: CM Consult Post Acute Care Choice: [place] Living arrangements for the past 2 months: Single Family Home                                       Social Determinants of Health (SDOH) Interventions    Readmission Risk Interventions Readmission Risk Prevention Plan 11/27/2020 02/29/2020  Transportation Screening Complete Complete  Home Care Screening - Complete  Medication Review ([place]) Complete -  PCP or Specialist appointment within 3-5 days of discharge Complete -  [place] or Home Care Consult Complete -  SW Recovery Care/Counseling Consult Complete -  Palliative Care Screening Not Applicable -  [place] Not Applicable -  Some recent data might be hidden

## 2021-02-19 DIAGNOSIS — R569 Unspecified convulsions: Secondary | ICD-10-CM | POA: Diagnosis not present

## 2021-02-19 LAB — GLUCOSE, CAPILLARY
Glucose-Capillary: 252 mg/dL — ABNORMAL HIGH (ref 70–99)
Glucose-Capillary: 243 mg/dL — ABNORMAL HIGH (ref 70–99)
Glucose-Capillary: 128 mg/dL — ABNORMAL HIGH (ref 70–99)
Glucose-Capillary: 149 mg/dL — ABNORMAL HIGH (ref 70–99)
Glucose-Capillary: 227 mg/dL — ABNORMAL HIGH (ref 70–99)

## 2021-02-19 MED ORDER — INSULIN GLARGINE-YFGN 100 UNIT/ML ~~LOC~~ SOLN
20.0000 [IU] | Freq: Every day | SUBCUTANEOUS | Status: DC
  Administered 2021-02-19 – 2021-03-04 (×14): 20 [IU] via SUBCUTANEOUS
  Filled 2021-02-19 (×15): qty 0.2

## 2021-02-19 MED ORDER — INSULIN ASPART 100 UNIT/ML IJ SOLN
3.0000 [IU] | Freq: Three times a day (TID) | INTRAMUSCULAR | Status: DC
  Administered 2021-02-19 – 2021-02-23 (×10): 3 [IU] via SUBCUTANEOUS

## 2021-02-19 NOTE — Progress Notes (Addendum)
[facility] Progress Note  Subjective: pt seen in room, no c/o   Vitals:   02/18/21 2348 02/19/21 0355 02/19/21 0755 02/19/21 1218  BP: 138/82 (!) 155/108 (!) 162/104 139/86  Pulse: 74 73 73 71  Resp:   18 18  Temp:  98.8 F (37.1 C)    TempSrc:  Oral    SpO2: 98% 100% 100% 100%  Weight:  100.1 kg    Height:        Exam:  alert, nad   no jvd  Chest cta bilat  Cor reg no RG  Abd soft ntnd no ascites   Ext 1+ pretib edema   Alert, NF, ox3  LFA AVF +bruit    OP HD: SW MWF  4h  450/800  86.5kg  2/2 bath  L AVF  Hep none -[name] 5 mcg IV TIW -Venofer 100 mcg IV X 10 doses 3/7 doses given last dose 02/04/21 -Mircera 225 mcg IV q 2 weeks (last dose 02/02/2021)  Assessment/ Plan  HHS: sp IV insulin, back on SQ insulin now AMS: resolved, thought to be related to elevated glucose +/- seizure. CT of head without acute changes/MRI neg as well. Neuro involved no anti sz meds req COVID 19- Per primary-  on RA at 100%, no medical treatment required. Pt is now off of COVID isolation. ESRD -  regular HD is MWF. Had HD here yesterday off schedule. Due to staffing issues we are doing HD just 2 times this week when possible. Next HD Friday.  R toe ulcer: On Doxycycline, WC consult per primary  Hypertensive Emergency: Medications and EDW adjusted at OP HD center last week. improved. UF with HD and continue to lower volume as tolerated. Coreg 12.5 BID-   cozaar 50 Torsemide 20 mg 2 tabs on non HD days.  BP's slightly high, but better. Suspect noncompliance with meds as OP  Hypertension/volume  - up 9kg by wts, doubt they are accurate. No vol excess on exam. Remains on RA. Get standing wt pre HD tomorrow.   Anemia  - HGB 9.7--9.1. ESA here weekly   Metabolic bone disease -  C Ca OK, PO4 elevated. Increase Renvela to 2 tabs PO TID AC-  phos better. Continue VDRA.   Nutrition -Albumin 2.5. Renal Carb mod diet, add nepro, renal vits. Disposition -  will likely need SNF placement  since his aunt can no longer care for him.  SW/CM involved, no plan yet.      Rob Shaelin Lalley 02/19/2021, 12:49 PM   Recent Labs  Lab 02/16/21 0654 02/17/21 1622 02/17/21 1623 02/18/21 0259  K 4.4  --  4.7 4.2  BUN 65*  --  85* 51*  CREATININE 9.99*  --  11.87* 8.22*  CALCIUM 8.1*  --  8.1* 8.1*  PHOS 4.9*  --  5.3* 4.0  HGB 10.1* 9.7*  --   --     Inpatient medications:  atorvastatin  40 mg Oral Daily   carvedilol  25 mg Oral BID WC   Chlorhexidine Gluconate Cloth  6 each Topical Q0600   [START ON 02/23/2021] darbepoetin (ARANESP) injection - DIALYSIS  60 mcg Intravenous Q Mon-HD   doxercalciferol  5 mcg Intravenous Q T,Th,Sa-HD   doxycycline  100 mg Oral Q12H   feeding supplement ([name] CARB STEADY)  237 mL Oral TID BM   heparin  5,000 Units Subcutaneous Q12H   imipramine  25 mg Oral QHS   insulin aspart  0-5 Units Subcutaneous QHS  insulin aspart  0-6 Units Subcutaneous TID WC   insulin aspart  3 Units Subcutaneous TID WC   insulin glargine-yfgn  20 Units Subcutaneous QHS   labetalol  10 mg Intravenous Once   loratadine  10 mg Oral Daily   losartan  50 mg Oral QHS   multivitamin  1 tablet Oral QHS   sevelamer carbonate  1,600 mg Oral TID WC   sodium chloride flush  3 mL Intravenous Q12H    sodium chloride     promethazine (PHENERGAN) injection (IM or IVPB)     sodium chloride, acetaminophen, butalbital-acetaminophen-caffeine, dextrose, hydrALAZINE, hydrOXYzine, promethazine (PHENERGAN) injection (IM or IVPB), sodium chloride flush

## 2021-02-19 NOTE — Progress Notes (Signed)
Inpatient Diabetes Program Recommendations  AACE/ADA: New Consensus Statement on Inpatient Glycemic Control  Target Ranges:  Prepandial:   less than 140 mg/dL      Peak postprandial:   less than 180 mg/dL (1-2 hours)      Critically ill patients:  140 - 180 mg/dL    Review of Glycemic Control  Results for CADARRIUS, ANTIGUA (MRN [id number]) as of 02/19/2021 08:36  Ref. Range 02/18/2021 09:54 02/18/2021 12:15 02/18/2021 15:58 02/18/2021 21:31 02/19/2021 06:02 02/19/2021 08:08  Glucose-Capillary Latest Ref Range: 70 - 99 mg/dL 278 (H) 100 (H) 109 (H) 219 (H) 252 (H) 243 (H)   Diabetes history: DM2 Outpatient Diabetes medications: Levemir 10 units daily (not taken in at least 4 months) Current orders for Inpatient glycemic control: Semglee 18 units QHS, Novlog 0-6 units TID with meals, Novolog 0-5 units QHS, Novolog 4 units TID with meals   [name] tid between meals  Inpatient Diabetes Program Recommendations:    Glucose trends elevated in the mornings indicating the need for more basal insulin. Trends decrease throughout the day with meal coverage and correction dose.   -  increase Semglee 20 units -  may need to decrease meal coverage to 2-3 units.  Thanks, Tama Headings RN, MSN, BC-ADM Inpatient Diabetes Coordinator Team Pager [phone] (8a-5p)

## 2021-02-19 NOTE — TOC Progression Note (Signed)
Transition of Care [facility]) - Progression Note    Patient Details  Name: [name] MRN: [id number] Date of Birth: [birth year]  Transition [facility] Care [facility]) CM/SW Contact  Pollie Friar, RN Phone Number: 02/19/2021, 2:56 PM  Clinical Narrative:    [facility] unable to accept as pts disability has not come through. Pt's aunt made aware and will call DSS.  TOC following.   Expected Discharge Plan: [place] Barriers to Discharge: Continued Medical Work up  Expected Discharge Plan and Services Expected Discharge Plan: [place]   Discharge Planning Services: CM Consult Post Acute Care Choice: [place] Living arrangements for the past 2 months: Single Family Home                                       Social Determinants of Health (SDOH) Interventions    Readmission Risk Interventions Readmission Risk Prevention Plan 11/27/2020 02/29/2020  Transportation Screening Complete Complete  Home Care Screening - Complete  Medication Review ([place]) Complete -  PCP or Specialist appointment within 3-5 days of discharge Complete -  [place] or Home Care Consult Complete -  SW Recovery Care/Counseling Consult Complete -  Palliative Care Screening Not Applicable -  [place] Not Applicable -  Some recent data might be hidden

## 2021-02-19 NOTE — Progress Notes (Signed)
PROGRESS NOTE    [name]  [id number] DOB: Nov 27, 1985 DOA: 02/08/2021 PCP: Gildardo Pounds, NP   Chief Complain: Seizures  Brief Narrative:  Patient is a 35 year old male with history of ESRD on dialysis,IDDM on insulin, HTN, HLD, legally blind, presented with syncope versus seizure.  Blood glucose were severely elevated on presentation.  He was thought to have seizures from elevated blood glucose levels.  Found to have incidental positive for COVID, no respiratory issues.  He used to live with his aunt but she is unable to take him back home.  TOC on board and awaiting for disposition plan.  Medically stable for discharge whenever possible.  Assessment & Plan:   Principal Problem:   Seizure-like activity ([place]) Active Problems:   Essential hypertension   DM2 (diabetes mellitus, type 2) (HCC)   Anemia of chronic kidney failure   Diabetic foot ulcer ([place])   Syncope   DKA (diabetic ketoacidosis) ([place])   Acute metabolic encephalopathy: Initially thought to have seizure from hyperglycemia.  EEG with questionable dysfunction in the left temporal region.  MRI was negative.  Encephalopathy has resolved.  Currently alert and oriented.  Patient is legally blind.  Hyperglycemia/insulin-dependent diabetes type 2: Elevated blood glucose on presentation. Monitor blood sugars, Continue current insulin regimen.  Diabetic coordinator following.  Incidental COVID-positive: Asymptomatic.  Initial chest x-ray was consistent with volume overload.  Received 1 dose of monoclonal antibody on admission.  Off isolation  ESRD on dialysis: Nephrology following.  Volume management as per dialysis.  Chronic combined congestive heart failure: Echo obtained during this hospitalization showed EF of 45%, needs outpatient cardiology follow-up.  Volume management as per dialysis  Right toe ulcer: On doxycycline ,follows with  podiatry.  Plan for 14 days course.Recommend outpatient  follow-up.  Hypertension: Continue current medications.  Monitor blood pressure.  Disposition: Patient is medically stable for discharge.  Previously was living with aunt but she is not able to take him back.  Disposition issue.  TOC following        Nutrition Problem: Increased nutrient needs Etiology: chronic illness (ESRD on HD)      DVT prophylaxis:Heparin [place] Code Status: Full Family Communication: None at bedside Status is: Inpatient  Remains inpatient appropriate because:Inpatient level of care appropriate due to severity of illness  Dispo: The patient is from: Home              Anticipated d/c is to: Home              Patient currently is medically stable to d/c.   Difficult to place patient Yes     Consultants: Nephrology  Procedures:Dialysis  Antimicrobials:  Anti-infectives (From admission, onward)    Start     Dose/Rate Route Frequency Ordered Stop   02/08/21 2200  doxycycline (VIBRA-TABS) tablet 100 mg        100 mg Oral Every 12 hours 02/08/21 1718 02/21/21 2359       Subjective:  Patient seen and examined the bedside this morning.  Denies ny complaints to me.  Sitting on the bed.   Objective: Vitals:   02/18/21 2347 02/18/21 2348 02/19/21 0355 02/19/21 0755  BP:  138/82 (!) 155/108 (!) 162/104  Pulse:  74 73 73  Resp:    18  Temp: 98.9 F (37.2 C)  98.8 F (37.1 C)   TempSrc: Oral  Oral   SpO2:  98% 100% 100%  Weight:   100.1 kg   Height:  Intake/Output Summary (Last 24 hours) at 02/19/2021 1158 Last data filed at 02/18/2021 2000 Gross per 24 hour  Intake 240 ml  Output --  Net 240 ml   Filed Weights   02/17/21 1520 02/18/21 0500 02/19/21 0355  Weight: 99.3 kg 97 kg 100.1 kg    Examination:   General exam: Overall comfortable, not in distress HEENT: PERRL,legally blind Respiratory system:  no wheezes or crackles  Cardiovascular system: S1 & S2 heard, RRR.  Gastrointestinal system: Abdomen is nondistended, soft and  nontender. Central nervous system: Alert and oriented Extremities: No edema, no clubbing ,no cyanosis, ulcer on the right middle toe, dialysis access on the left forearm Skin: No rashes,no icterus    Data Reviewed: I have personally reviewed following labs and imaging studies  CBC: Recent Labs  Lab 02/13/21 0332 02/16/21 0654 02/17/21 1622  WBC 5.7 5.7 5.4  NEUTROABS 3.8  --   --   HGB 10.0* 10.1* 9.7*  HCT 31.5* 31.3* 30.2*  MCV 82.5 81.3 82.1  PLT 334 281 [id number]   Basic Metabolic Panel: Recent Labs  Lab 02/13/21 0332 02/14/21 0317 02/15/21 0537 02/16/21 0654 02/17/21 1623 02/18/21 0259  NA 129* 123* 124* 125* 125* 126*  K 4.2 4.4 4.3 4.4 4.7 4.2  CL 93* 90* 91* 90* 93* 91*  CO2 '27 22 25 23 '$ 20* 25  GLUCOSE 260* 391* 411* 366* 223* 392*  BUN 43* 62* 44* 65* 85* 51*  CREATININE 8.25* 9.72* 7.78* 9.99* 11.87* 8.22*  CALCIUM 7.8* 7.7* 7.8* 8.1* 8.1* 8.1*  MG 2.1  --   --   --   --   --   PHOS 4.5  --   --  4.9* 5.3* 4.0   GFR: Estimated Creatinine Clearance: 15.2 mL/min (A) (by C-G formula based on SCr of 8.22 mg/dL (H)). Liver Function Tests: Recent Labs  Lab 02/13/21 0332 02/16/21 0654 02/17/21 1623 02/18/21 0259  AST 16  --   --   --   ALT 21  --   --   --   ALKPHOS 120  --   --   --   BILITOT 0.8  --   --   --   PROT 5.8*  --   --   --   ALBUMIN 2.5* 2.7* 2.8* 2.8*   No results for input(s): LIPASE, AMYLASE in the last 168 hours. No results for input(s): AMMONIA in the last 168 hours. Coagulation Profile: No results for input(s): INR, PROTIME in the last 168 hours. Cardiac Enzymes: No results for input(s): CKTOTAL, CKMB, CKMBINDEX, TROPONINI in the last 168 hours. BNP (last 3 results) No results for input(s): PROBNP in the last 8760 hours. HbA1C: No results for input(s): HGBA1C in the last 72 hours. CBG: Recent Labs  Lab 02/18/21 1215 02/18/21 1558 02/18/21 2131 02/19/21 0602 02/19/21 0808  GLUCAP 100* 109* 219* 252* 243*   Lipid Profile: No  results for input(s): CHOL, HDL, LDLCALC, TRIG, CHOLHDL, LDLDIRECT in the last 72 hours. Thyroid Function Tests: No results for input(s): TSH, T4TOTAL, FREET4, T3FREE, THYROIDAB in the last 72 hours. Anemia Panel: No results for input(s): VITAMINB12, FOLATE, FERRITIN, TIBC, IRON, RETICCTPCT in the last 72 hours. Sepsis Labs: No results for input(s): PROCALCITON, LATICACIDVEN in the last 168 hours.   No results found for this or any previous visit (from the past 240 hour(s)).        Radiology Studies: No results found.      Scheduled Meds:  atorvastatin  40 mg Oral  Daily   carvedilol  25 mg Oral BID WC   Chlorhexidine Gluconate Cloth  6 each Topical Q0600   [START ON 02/23/2021] darbepoetin (ARANESP) injection - DIALYSIS  60 mcg Intravenous Q Mon-HD   doxercalciferol  5 mcg Intravenous Q T,Th,Sa-HD   doxycycline  100 mg Oral Q12H   feeding supplement ([name] CARB STEADY)  237 mL Oral TID BM   heparin  5,000 Units Subcutaneous Q12H   imipramine  25 mg Oral QHS   insulin aspart  0-5 Units Subcutaneous QHS   insulin aspart  0-6 Units Subcutaneous TID WC   insulin aspart  3 Units Subcutaneous TID WC   insulin glargine-yfgn  20 Units Subcutaneous QHS   labetalol  10 mg Intravenous Once   loratadine  10 mg Oral Daily   losartan  50 mg Oral QHS   multivitamin  1 tablet Oral QHS   sevelamer carbonate  1,600 mg Oral TID WC   sodium chloride flush  3 mL Intravenous Q12H   Continuous Infusions:  sodium chloride     promethazine (PHENERGAN) injection (IM or IVPB)       LOS: 11 days    Time spent: 15 mins.More than 50% of that time was spent in counseling and/or coordination of care.      Shelly Coss, MD [facility] P8/05/2021, 11:58 AM

## 2021-02-20 DIAGNOSIS — R569 Unspecified convulsions: Secondary | ICD-10-CM | POA: Diagnosis not present

## 2021-02-20 LAB — CBC
HCT: 28.2 % — ABNORMAL LOW (ref 39.0–52.0)
Hemoglobin: 9.1 g/dL — ABNORMAL LOW (ref 13.0–17.0)
MCH: 26.4 pg (ref 26.0–34.0)
MCHC: 32.3 g/dL (ref 30.0–36.0)
MCV: 81.7 fL (ref 80.0–100.0)
Platelets: 264 10*3/uL (ref 150–400)
RBC: 3.45 MIL/uL — ABNORMAL LOW (ref 4.22–5.81)
RDW: 18 % — ABNORMAL HIGH (ref 11.5–15.5)
WBC: 5.1 10*3/uL (ref 4.0–10.5)
nRBC: 0 % (ref 0.0–0.2)

## 2021-02-20 LAB — RENAL FUNCTION PANEL
Albumin: 2.9 g/dL — ABNORMAL LOW (ref 3.5–5.0)
Anion gap: 15 (ref 5–15)
BUN: 102 mg/dL — ABNORMAL HIGH (ref 6–20)
CO2: 19 mmol/L — ABNORMAL LOW (ref 22–32)
Calcium: 8.1 mg/dL — ABNORMAL LOW (ref 8.9–10.3)
Chloride: 94 mmol/L — ABNORMAL LOW (ref 98–111)
Creatinine, Ser: 11.94 mg/dL — ABNORMAL HIGH (ref 0.61–1.24)
GFR, Estimated: 5 mL/min — ABNORMAL LOW (ref >60)
Glucose, Bld: 149 mg/dL — ABNORMAL HIGH (ref 70–99)
Phosphorus: 5.7 mg/dL — ABNORMAL HIGH (ref 2.5–4.6)
Potassium: 4.7 mmol/L (ref 3.5–5.1)
Sodium: 128 mmol/L — ABNORMAL LOW (ref 135–145)

## 2021-02-20 LAB — GLUCOSE, CAPILLARY
Glucose-Capillary: 199 mg/dL — ABNORMAL HIGH (ref 70–99)
Glucose-Capillary: 103 mg/dL — ABNORMAL HIGH (ref 70–99)
Glucose-Capillary: 153 mg/dL — ABNORMAL HIGH (ref 70–99)
Glucose-Capillary: 231 mg/dL — ABNORMAL HIGH (ref 70–99)

## 2021-02-20 MED ORDER — DOXERCALCIFEROL 4 MCG/2ML IV SOLN
5.0000 ug | INTRAVENOUS | Status: DC
  Administered 2021-02-25 – 2021-03-02 (×3): 5 ug via INTRAVENOUS
  Filled 2021-02-20 (×5): qty 4

## 2021-02-20 NOTE — Progress Notes (Signed)
PROGRESS NOTE    [name]  [id number] DOB: Nov 14, 1985 DOA: 02/08/2021 PCP: Gildardo Pounds, NP   Chief Complain: Seizures  Brief Narrative:  Patient is a 35 year old male with history of ESRD on dialysis,IDDM on insulin, HTN, HLD, legally blind, presented with syncope versus seizure.  Blood glucose were severely elevated on presentation.  He was thought to have seizures from elevated blood glucose levels.  Found to have incidental positive for COVID, no respiratory issues.  He used to live with his aunt but she is unable to take him back home.  TOC on board and awaiting for disposition plan.  Medically stable for discharge whenever possible.  Assessment & Plan:   Principal Problem:   Seizure-like activity ([place]) Active Problems:   Essential hypertension   DM2 (diabetes mellitus, type 2) (HCC)   Anemia of chronic kidney failure   Diabetic foot ulcer ([place])   Syncope   DKA (diabetic ketoacidosis) ([place])   Acute metabolic encephalopathy: Initially thought to have seizure from hyperglycemia.  EEG with questionable dysfunction in the left temporal region.  MRI was negative.  Encephalopathy has resolved.  Currently alert and oriented.  Patient is legally blind.  Hyperglycemia/insulin-dependent diabetes type 2: Elevated blood glucose on presentation. Monitor blood sugars, Continue current insulin regimen.  Diabetic coordinator following.  Incidental COVID-positive: Asymptomatic.  Initial chest x-ray was consistent with volume overload.  Received 1 dose of monoclonal antibody on admission.  Off isolation  ESRD on dialysis: Nephrology following.  Volume management as per dialysis.  Chronic combined congestive heart failure: Echo obtained during this hospitalization showed EF of 45%, needs outpatient cardiology follow-up.  Volume management as per dialysis  Right toe ulcer: On doxycycline ,follows with  podiatry.  Plan for 14 days course.Recommend outpatient  follow-up.  Hypertension: Continue current medications.  Monitor blood pressure.  Disposition: Patient is medically stable for discharge.  Previously was living with aunt but she is not able to take him back.  Disposition issue.  TOC following        Nutrition Problem: Increased nutrient needs Etiology: chronic illness (ESRD on HD)      DVT prophylaxis:Heparin [place] Code Status: Full Family Communication: None at bedside Status is: Inpatient  Remains inpatient appropriate because:Inpatient level of care appropriate due to severity of illness  Dispo: The patient is from: Home              Anticipated d/c is to:Not sure              Patient currently is medically stable to d/c.   Difficult to place patient Yes     Consultants: Nephrology  Procedures:Dialysis  Antimicrobials:  Anti-infectives (From admission, onward)    Start     Dose/Rate Route Frequency Ordered Stop   02/08/21 2200  doxycycline (VIBRA-TABS) tablet 100 mg        100 mg Oral Every 12 hours 02/08/21 1718 02/21/21 2359       Subjective:  Patient seen and examined the bedside this morning.  Hemodynamically stable.  He was seen at the dialysis suite.  Denies any complaints.  Objective: Vitals:   02/19/21 2026 02/20/21 0013 02/20/21 0438 02/20/21 0712  BP: (!) 155/89 (!) 151/91 (!) 141/91 (!) 160/99  Pulse: 77 76 70 70  Resp: '18 17 19 18  '$ Temp: 98.6 F (37 C) 99.4 F (37.4 C) (!) 97.4 F (36.3 C) 97.7 F (36.5 C)  TempSrc: Oral Oral Oral Oral  SpO2: 98% 100% 100% 97%  Weight:  101.6 kg   Height:       No intake or output data in the 24 hours ending 02/20/21 0819  Filed Weights   02/18/21 0500 02/19/21 0355 02/20/21 0438  Weight: 97 kg 100.1 kg 101.6 kg    Examination:  General exam: Overall comfortable, not in distress HEENT: legally blind Respiratory system:  no wheezes or crackles  Cardiovascular system: S1 & S2 heard, RRR.  Gastrointestinal system: Abdomen is nondistended, soft  and nontender. Central nervous system: Alert and oriented Extremities: No edema, no clubbing ,no cyanosis, ulcer on the right middle toe, dialysis access on the left forearm Skin: No rashes, no ulcers,no icterus    Data Reviewed: I have personally reviewed following labs and imaging studies  CBC: Recent Labs  Lab 02/16/21 0654 02/17/21 1622  WBC 5.7 5.4  HGB 10.1* 9.7*  HCT 31.3* 30.2*  MCV 81.3 82.1  PLT 281 [id number]   Basic Metabolic Panel: Recent Labs  Lab 02/14/21 0317 02/15/21 0537 02/16/21 0654 02/17/21 1623 02/18/21 0259  NA 123* 124* 125* 125* 126*  K 4.4 4.3 4.4 4.7 4.2  CL 90* 91* 90* 93* 91*  CO2 '22 25 23 '$ 20* 25  GLUCOSE 391* 411* 366* 223* 392*  BUN 62* 44* 65* 85* 51*  CREATININE 9.72* 7.78* 9.99* 11.87* 8.22*  CALCIUM 7.7* 7.8* 8.1* 8.1* 8.1*  PHOS  --   --  4.9* 5.3* 4.0   GFR: Estimated Creatinine Clearance: 15.3 mL/min (A) (by C-G formula based on SCr of 8.22 mg/dL (H)). Liver Function Tests: Recent Labs  Lab 02/16/21 0654 02/17/21 1623 02/18/21 0259  ALBUMIN 2.7* 2.8* 2.8*   No results for input(s): LIPASE, AMYLASE in the last 168 hours. No results for input(s): AMMONIA in the last 168 hours. Coagulation Profile: No results for input(s): INR, PROTIME in the last 168 hours. Cardiac Enzymes: No results for input(s): CKTOTAL, CKMB, CKMBINDEX, TROPONINI in the last 168 hours. BNP (last 3 results) No results for input(s): PROBNP in the last 8760 hours. HbA1C: No results for input(s): HGBA1C in the last 72 hours. CBG: Recent Labs  Lab 02/19/21 0808 02/19/21 1220 02/19/21 1653 02/19/21 2119 02/20/21 0609  GLUCAP 243* 128* 149* 227* 199*   Lipid Profile: No results for input(s): CHOL, HDL, LDLCALC, TRIG, CHOLHDL, LDLDIRECT in the last 72 hours. Thyroid Function Tests: No results for input(s): TSH, T4TOTAL, FREET4, T3FREE, THYROIDAB in the last 72 hours. Anemia Panel: No results for input(s): VITAMINB12, FOLATE, FERRITIN, TIBC, IRON,  RETICCTPCT in the last 72 hours. Sepsis Labs: No results for input(s): PROCALCITON, LATICACIDVEN in the last 168 hours.   No results found for this or any previous visit (from the past 240 hour(s)).        Radiology Studies: No results found.      Scheduled Meds:  atorvastatin  40 mg Oral Daily   carvedilol  25 mg Oral BID WC   Chlorhexidine Gluconate Cloth  6 each Topical Q0600   [START ON 02/23/2021] darbepoetin (ARANESP) injection - DIALYSIS  60 mcg Intravenous Q Mon-HD   doxercalciferol  5 mcg Intravenous Q T,Th,Sa-HD   doxycycline  100 mg Oral Q12H   feeding supplement ([name] CARB STEADY)  237 mL Oral TID BM   heparin  5,000 Units Subcutaneous Q12H   imipramine  25 mg Oral QHS   insulin aspart  0-5 Units Subcutaneous QHS   insulin aspart  0-6 Units Subcutaneous TID WC   insulin aspart  3 Units Subcutaneous TID WC   insulin  glargine-yfgn  20 Units Subcutaneous QHS   labetalol  10 mg Intravenous Once   loratadine  10 mg Oral Daily   losartan  50 mg Oral QHS   multivitamin  1 tablet Oral QHS   sevelamer carbonate  1,600 mg Oral TID WC   sodium chloride flush  3 mL Intravenous Q12H   Continuous Infusions:  sodium chloride     promethazine (PHENERGAN) injection (IM or IVPB)       LOS: 12 days    Time spent: 15 mins.More than 50% of that time was spent in counseling and/or coordination of care.      Shelly Coss, MD [facility] P8/06/2021, 8:19 AM

## 2021-02-20 NOTE — Progress Notes (Signed)
[facility] Progress Note  Subjective: [name] seen on HD, no c/o   Vitals:   02/20/21 1000 02/20/21 1030 02/20/21 1100 02/20/21 1200  BP: 133/75 130/71 131/81 136/76  Pulse: 68 68 67 70  Resp:      Temp:      TempSrc:      SpO2: 98% 100% 100% 100%  Weight:      Height:        Exam:  alert, nad   no jvd  Chest cta bilat  Cor reg no RG  Abd soft ntnd no ascites   Ext 1+ pretib edema   Alert, NF, ox3  LFA AVF +bruit    OP HD: SW MWF  4h  450/800  86.5kg  2/2 bath  L AVF  Hep none -[name] 5 mcg IV TIW -Venofer 100 mcg IV X 10 doses 3/7 doses given last dose 02/04/21 -Mircera 225 mcg IV q 2 weeks (last dose 02/02/2021)  Assessment/ Plan  HHS: sp IV insulin, back on SQ insulin now AMS: resolved, thought to be related to elevated glucose +/- seizure. CT of head without acute changes/MRI neg as well. Neuro involved no anti sz meds req COVID 19- Per primary-  on RA at 100%, no medical treatment required. [name] is now off of COVID isolation. ESRD -  regular HD is MWF. Had HD here Tuesday off schedule. Next HD today.  R toe ulcer: On Doxycycline, WC consult per primary  Hypertensive Emergency: Medications and EDW adjusted at OP HD center last week. improved. UF with HD and continue to lower volume as tolerated. Coreg 12.5 BID-   cozaar 50 Torsemide 20 mg 2 tabs on non HD days.  BP's slightly high, but better. Suspect noncompliance with meds as OP  Volume  - up 16kg by wts, not sure accurate. Get standing wt. 5 L UF goal today.   Anemia  - HGB 9.7--9.1. ESA here weekly   Metabolic bone disease -  C Ca OK, PO4 elevated. Increase Renvela to 2 tabs PO TID AC-  phos better. Continue VDRA.   Nutrition -Albumin 2.5. Renal Carb mod diet, add nepro, renal vits. Disposition -  will likely need SNF placement since his aunt can no longer care for him.  SW/CM involved, no plan yet.      Rob Florene Brill 02/20/2021, 1:25 PM   Recent Labs  Lab 02/17/21 1622 02/17/21 1623  02/18/21 0259 02/20/21 0900  K  --    < > 4.2 4.7  BUN  --    < > 51* 102*  CREATININE  --    < > 8.22* 11.94*  CALCIUM  --    < > 8.1* 8.1*  PHOS  --    < > 4.0 5.7*  HGB 9.7*  --   --  9.1*   < > = values in this interval not displayed.    Inpatient medications:  atorvastatin  40 mg Oral Daily   carvedilol  25 mg Oral BID WC   Chlorhexidine Gluconate Cloth  6 each Topical Q0600   [START ON 02/23/2021] darbepoetin (ARANESP) injection - DIALYSIS  60 mcg Intravenous Q Mon-HD   doxercalciferol  5 mcg Intravenous Q M,W,F-HD   doxycycline  100 mg Oral Q12H   feeding supplement ([name] CARB STEADY)  237 mL Oral TID BM   heparin  5,000 Units Subcutaneous Q12H   imipramine  25 mg Oral QHS   insulin aspart  0-5 Units Subcutaneous QHS   insulin aspart  0-6 Units Subcutaneous TID WC   insulin aspart  3 Units Subcutaneous TID WC   insulin glargine-yfgn  20 Units Subcutaneous QHS   labetalol  10 mg Intravenous Once   loratadine  10 mg Oral Daily   losartan  50 mg Oral QHS   multivitamin  1 tablet Oral QHS   sevelamer carbonate  1,600 mg Oral TID WC   sodium chloride flush  3 mL Intravenous Q12H    sodium chloride     promethazine (PHENERGAN) injection (IM or IVPB)     sodium chloride, acetaminophen, butalbital-acetaminophen-caffeine, dextrose, hydrALAZINE, hydrOXYzine, promethazine (PHENERGAN) injection (IM or IVPB), sodium chloride flush

## 2021-02-21 DIAGNOSIS — R569 Unspecified convulsions: Secondary | ICD-10-CM | POA: Diagnosis not present

## 2021-02-21 LAB — GLUCOSE, CAPILLARY
Glucose-Capillary: 168 mg/dL — ABNORMAL HIGH (ref 70–99)
Glucose-Capillary: 157 mg/dL — ABNORMAL HIGH (ref 70–99)
Glucose-Capillary: 132 mg/dL — ABNORMAL HIGH (ref 70–99)
Glucose-Capillary: 227 mg/dL — ABNORMAL HIGH (ref 70–99)

## 2021-02-21 NOTE — Progress Notes (Signed)
PROGRESS NOTE    [name]  [id number] DOB: 05/15/1986 DOA: 02/08/2021 PCP: Gildardo Pounds, NP   Chief Complain: Seizures  Brief Narrative:  Patient is a 35 year old male with history of ESRD on dialysis,IDDM on insulin, HTN, HLD, legally blind, presented with syncope versus seizure.  Blood glucose were severely elevated on presentation.  He was thought to have seizures from elevated blood glucose levels.  Found to have incidental positive for COVID, no respiratory issues.  He used to live with his aunt but she is unable to take him back home.  TOC on board and awaiting for disposition plan.  Medically stable for discharge whenever possible.  Assessment & Plan:   Principal Problem:   Seizure-like activity ([place]) Active Problems:   Essential hypertension   DM2 (diabetes mellitus, type 2) (HCC)   Anemia of chronic kidney failure   Diabetic foot ulcer ([place])   Syncope   DKA (diabetic ketoacidosis) ([place])   Acute metabolic encephalopathy: Initially thought to have seizure from hyperglycemia.  EEG with questionable dysfunction in the left temporal region.  MRI was negative.  Encephalopathy has resolved.  Currently alert and oriented.  Patient is legally blind.  Hyperglycemia/insulin-dependent diabetes type 2: Elevated blood glucose on presentation. Monitor blood sugars, Continue current insulin regimen.  Diabetic coordinator following.  Incidental COVID-positive: Asymptomatic.  Initial chest x-ray was consistent with volume overload.  Received 1 dose of monoclonal antibody on admission.  Off isolation  ESRD on dialysis: Nephrology following.  Volume management as per dialysis.  Chronic combined congestive heart failure: Echo obtained during this hospitalization showed EF of 45%, needs outpatient cardiology follow-up.  Volume management as per dialysis  Right toe ulcer: On doxycycline ,follows with  podiatry.  Plan for 14 days course.Recommend outpatient  follow-up.  Hypertension: Continue current medications.  Monitor blood pressure.  Disposition: Patient is medically stable for discharge.  Previously was living with aunt but she is not able to take him back.  Disposition issue.  TOC following        Nutrition Problem: Increased nutrient needs Etiology: chronic illness (ESRD on HD)      DVT prophylaxis:Heparin [place] Code Status: Full Family Communication: None at bedside Status is: Inpatient  Remains inpatient appropriate because:Inpatient level of care appropriate due to severity of illness  Dispo: The patient is from: Home              Anticipated d/c is to:Not sure              Patient currently is medically stable to d/c.   Difficult to place patient Yes     Consultants: Nephrology  Procedures:Dialysis  Antimicrobials:  Anti-infectives (From admission, onward)    Start     Dose/Rate Route Frequency Ordered Stop   02/08/21 2200  doxycycline (VIBRA-TABS) tablet 100 mg        100 mg Oral Every 12 hours 02/08/21 1718 02/21/21 2359       Subjective:  Patient seen and examined the bedside this morning.  Hemodynamically stable.  No new complaints today  Objective: Vitals:   02/20/21 1527 02/20/21 2033 02/21/21 0001 02/21/21 0401  BP: (!) 162/96 133/73 126/74 (!) 171/110  Pulse: 70 76 72 76  Resp: '18 17 18 18  '$ Temp: 99.3 F (37.4 C) 97.9 F (36.6 C) 98.3 F (36.8 C) (!) 97.4 F (36.3 C)  TempSrc: Oral Oral Oral Oral  SpO2: 100% 98% 100% 100%  Weight:      Height:  Intake/Output Summary (Last 24 hours) at 02/21/2021 0809 Last data filed at 02/20/2021 1235 Gross per 24 hour  Intake --  Output 5005 ml  Net -5005 ml    Filed Weights   02/20/21 0438 02/20/21 0850 02/20/21 1235  Weight: 101.6 kg 102.8 kg 97.9 kg    Examination:  General exam: Overall comfortable, not in distress HEENT: legally blind Respiratory system:  no wheezes or crackles  Cardiovascular system: S1 & S2 heard, RRR.   Gastrointestinal system: Abdomen is nondistended, soft and nontender. Central nervous system: Alert and oriented Extremities: No edema, no clubbing ,no cyanosis, ulcer on the right middle toe, dialysis access on the left forearm Skin: No rashes, no ulcers,no icterus    Data Reviewed: I have personally reviewed following labs and imaging studies  CBC: Recent Labs  Lab 02/16/21 0654 02/17/21 1622 02/20/21 0900  WBC 5.7 5.4 5.1  HGB 10.1* 9.7* 9.1*  HCT 31.3* 30.2* 28.2*  MCV 81.3 82.1 81.7  PLT 281 295 [id number]   Basic Metabolic Panel: Recent Labs  Lab 02/15/21 0537 02/16/21 0654 02/17/21 1623 02/18/21 0259 02/20/21 0900  NA 124* 125* 125* 126* 128*  K 4.3 4.4 4.7 4.2 4.7  CL 91* 90* 93* 91* 94*  CO2 25 23 20* 25 19*  GLUCOSE 411* 366* 223* 392* 149*  BUN 44* 65* 85* 51* 102*  CREATININE 7.78* 9.99* 11.87* 8.22* 11.94*  CALCIUM 7.8* 8.1* 8.1* 8.1* 8.1*  PHOS  --  4.9* 5.3* 4.0 5.7*   GFR: Estimated Creatinine Clearance: 10.4 mL/min (A) (by C-G formula based on SCr of 11.94 mg/dL (H)). Liver Function Tests: Recent Labs  Lab 02/16/21 0654 02/17/21 1623 02/18/21 0259 02/20/21 0900  ALBUMIN 2.7* 2.8* 2.8* 2.9*   No results for input(s): LIPASE, AMYLASE in the last 168 hours. No results for input(s): AMMONIA in the last 168 hours. Coagulation Profile: No results for input(s): INR, PROTIME in the last 168 hours. Cardiac Enzymes: No results for input(s): CKTOTAL, CKMB, CKMBINDEX, TROPONINI in the last 168 hours. BNP (last 3 results) No results for input(s): PROBNP in the last 8760 hours. HbA1C: No results for input(s): HGBA1C in the last 72 hours. CBG: Recent Labs  Lab 02/20/21 0609 02/20/21 1358 02/20/21 1827 02/20/21 2135 02/21/21 0647  GLUCAP 199* 103* 153* 231* 168*   Lipid Profile: No results for input(s): CHOL, HDL, LDLCALC, TRIG, CHOLHDL, LDLDIRECT in the last 72 hours. Thyroid Function Tests: No results for input(s): TSH, T4TOTAL, FREET4, T3FREE,  THYROIDAB in the last 72 hours. Anemia Panel: No results for input(s): VITAMINB12, FOLATE, FERRITIN, TIBC, IRON, RETICCTPCT in the last 72 hours. Sepsis Labs: No results for input(s): PROCALCITON, LATICACIDVEN in the last 168 hours.   No results found for this or any previous visit (from the past 240 hour(s)).        Radiology Studies: No results found.      Scheduled Meds:  atorvastatin  40 mg Oral Daily   carvedilol  25 mg Oral BID WC   Chlorhexidine Gluconate Cloth  6 each Topical Q0600   [START ON 02/23/2021] darbepoetin (ARANESP) injection - DIALYSIS  60 mcg Intravenous Q Mon-HD   doxercalciferol  5 mcg Intravenous Q M,W,F-HD   doxycycline  100 mg Oral Q12H   feeding supplement ([name] CARB STEADY)  237 mL Oral TID BM   heparin  5,000 Units Subcutaneous Q12H   imipramine  25 mg Oral QHS   insulin aspart  0-5 Units Subcutaneous QHS   insulin aspart  0-6 Units  Subcutaneous TID WC   insulin aspart  3 Units Subcutaneous TID WC   insulin glargine-yfgn  20 Units Subcutaneous QHS   labetalol  10 mg Intravenous Once   loratadine  10 mg Oral Daily   losartan  50 mg Oral QHS   multivitamin  1 tablet Oral QHS   sevelamer carbonate  1,600 mg Oral TID WC   sodium chloride flush  3 mL Intravenous Q12H   Continuous Infusions:  sodium chloride     promethazine (PHENERGAN) injection (IM or IVPB)       LOS: 13 days    Time spent: 15 mins.More than 50% of that time was spent in counseling and/or coordination of care.      Shelly Coss, MD [facility] P8/13/2022, 8:09 AM

## 2021-02-21 NOTE — Plan of Care (Signed)

## 2021-02-21 NOTE — Progress Notes (Addendum)
[facility] Progress Note  Subjective: pt seen in room, 5 L off w/ Hd yesterday. Wt's down to 97.5kg post HD and that was a standing wt.    Vitals:   02/21/21 0816 02/21/21 1247 02/21/21 1643 02/21/21 1950  BP: (!) 157/103 137/87 (!) 158/101 (!) 141/96  Pulse: 74 72 66 69  Resp: 18   18  Temp:   98.2 F (36.8 C) 97.7 F (36.5 C)  TempSrc:   Oral Oral  SpO2: 98% 99% 100% 100%  Weight:      Height:        Exam:  alert, nad   no jvd  Chest cta bilat  Cor reg no RG  Abd soft ntnd no ascites   Ext 1+ pretib edema   Alert, NF, ox3  LFA AVF +bruit    OP HD: SW MWF  4h  450/800  86.5kg  2/2 bath  L AVF  Hep none -[name] 5 mcg IV TIW -Venofer 100 mcg IV X 10 doses 3/7 doses given last dose 02/04/21 -Mircera 225 mcg IV q 2 weeks (last dose 02/02/2021)  Assessment/ Plan Volume overload - pt was 16kg up at peak and is now 11kg up after HD x 4 here. He was admitted 7kg up. Needs strict fluid restriction, will d/w staff. We did standing wts and they are matching the bed wts. Get CXR for baseline although not hypoxic. And move to renal floor for better dietary discipline.  ESRD - on HD MWF.  Had HD Tues / Friday this week due to low staffing issues. Next HD Monday, cont to lower volume.  HHS: sp IV insulin, back on SQ insulin now AMS: resolved, thought to be related to elevated glucose +/- seizure. CT of head without acute changes/MRI neg as well. Neuro involved no anti sz meds req COVID 19- Per primary-  on RA at 100%, no medical treatment required. Pt is now off of COVID isolation R toe ulcer: On Doxycycline, WC consult per primary  Hypertensive Emergency: Medications and EDW adjusted at OP HD center last week. improved. UF with HD and continue to lower volume as tolerated. Coreg 12.5 BID-   cozaar 50 Torsemide 20 mg 2 tabs on non HD days.  BP's slightly high, but better. Suspect noncompliance with meds as OP  Anemia  - HGB 9.7--9.1. ESA here weekly   Metabolic bone  disease -  C Ca OK, PO4 elevated. Increase Renvela to 2 tabs PO TID AC-  phos better. Continue VDRA.   Nutrition -Albumin 2.5. Renal Carb mod diet, add nepro, renal vits. Disposition -  will likely need SNF placement since his aunt can no longer care for him.  SW/CM involved, no plan yet.      Rob Aniesha Haughn 02/21/2021, 8:47 PM   Recent Labs  Lab 02/17/21 1622 02/17/21 1623 02/18/21 0259 02/20/21 0900  K  --    < > 4.2 4.7  BUN  --    < > 51* 102*  CREATININE  --    < > 8.22* 11.94*  CALCIUM  --    < > 8.1* 8.1*  PHOS  --    < > 4.0 5.7*  HGB 9.7*  --   --  9.1*   < > = values in this interval not displayed.    Inpatient medications:  atorvastatin  40 mg Oral Daily   carvedilol  25 mg Oral BID WC   Chlorhexidine Gluconate Cloth  6 each Topical Q0600   [  START ON 02/23/2021] darbepoetin (ARANESP) injection - DIALYSIS  60 mcg Intravenous Q Mon-HD   doxercalciferol  5 mcg Intravenous Q M,W,F-HD   doxycycline  100 mg Oral Q12H   feeding supplement ([name] CARB STEADY)  237 mL Oral TID BM   heparin  5,000 Units Subcutaneous Q12H   imipramine  25 mg Oral QHS   insulin aspart  0-5 Units Subcutaneous QHS   insulin aspart  0-6 Units Subcutaneous TID WC   insulin aspart  3 Units Subcutaneous TID WC   insulin glargine-yfgn  20 Units Subcutaneous QHS   labetalol  10 mg Intravenous Once   loratadine  10 mg Oral Daily   losartan  50 mg Oral QHS   multivitamin  1 tablet Oral QHS   sevelamer carbonate  1,600 mg Oral TID WC   sodium chloride flush  3 mL Intravenous Q12H    sodium chloride     promethazine (PHENERGAN) injection (IM or IVPB)     sodium chloride, acetaminophen, butalbital-acetaminophen-caffeine, dextrose, hydrALAZINE, hydrOXYzine, promethazine (PHENERGAN) injection (IM or IVPB), sodium chloride flush

## 2021-02-22 ENCOUNTER — Inpatient Hospital Stay (HOSPITAL_COMMUNITY): Payer: Medicaid Other

## 2021-02-22 DIAGNOSIS — R569 Unspecified convulsions: Secondary | ICD-10-CM | POA: Diagnosis not present

## 2021-02-22 LAB — GLUCOSE, CAPILLARY
Glucose-Capillary: 74 mg/dL (ref 70–99)
Glucose-Capillary: 195 mg/dL — ABNORMAL HIGH (ref 70–99)
Glucose-Capillary: 257 mg/dL — ABNORMAL HIGH (ref 70–99)

## 2021-02-22 NOTE — Progress Notes (Signed)
Pt transferring to 5MW and reported given to receiving nurse. Pt informed of the transfer and transported off unit via wheelchair with belongings to the side by Charge RN and NT. Delia Heady RN

## 2021-02-22 NOTE — Progress Notes (Signed)
PROGRESS NOTE    [name]  [id number] DOB: 16-Jul-1985 DOA: 02/08/2021 PCP: Gildardo Pounds, NP   Chief Complain: Seizures  Brief Narrative:  Patient is a 35 year old male with history of ESRD on dialysis,IDDM on insulin, HTN, HLD, legally blind, presented with syncope versus seizure.  Blood glucose were severely elevated on presentation.  He was thought to have seizures from elevated blood glucose levels.  Found to have incidental positive for COVID, no respiratory issues.  He used to live with his aunt but she is unable to take him back home.  TOC on board and awaiting for disposition plan.  Medically stable for discharge whenever possible.  Assessment & Plan:   Principal Problem:   Seizure-like activity ([place]) Active Problems:   Essential hypertension   DM2 (diabetes mellitus, type 2) (HCC)   Anemia of chronic kidney failure   Diabetic foot ulcer ([place])   Syncope   DKA (diabetic ketoacidosis) ([place])   Acute metabolic encephalopathy: Initially thought to have seizure from hyperglycemia.  EEG with questionable dysfunction in the left temporal region.  MRI was negative.  Encephalopathy has resolved.  Currently alert and oriented.  Patient is legally blind.  Hyperglycemia/insulin-dependent diabetes type 2: Elevated blood glucose on presentation. Monitor blood sugars, Continue current insulin regimen.  Diabetic coordinator following.  Incidental COVID-positive: Asymptomatic.  Initial chest x-ray was consistent with volume overload.  Received 1 dose of monoclonal antibody on admission.  Off isolation  ESRD on dialysis: Nephrology following.  Volume management as per dialysis.  Chronic combined congestive heart failure: Echo obtained during this hospitalization showed EF of 45%, needs outpatient cardiology follow-up.  Volume management as per dialysis  Right toe ulcer: On doxycycline ,follows with  podiatry.  Plan for 14 days course.Recommend outpatient  follow-up.  Hypertension: Continue current medications.  Monitor blood pressure.  Disposition: Patient is medically stable for discharge.  Previously was living with aunt but she is not able to take him back.  Disposition issue.  TOC following        Nutrition Problem: Increased nutrient needs Etiology: chronic illness (ESRD on HD)      DVT prophylaxis:Heparin [place] Code Status: Full Family Communication: None at bedside Status is: Inpatient  Remains inpatient appropriate because:Inpatient level of care appropriate due to severity of illness  Dispo: The patient is from: Home              Anticipated d/c is to:Not sure              Patient currently is medically stable to d/c.   Difficult to place patient Yes     Consultants: Nephrology  Procedures:Dialysis  Antimicrobials:  Anti-infectives (From admission, onward)    Start     Dose/Rate Route Frequency Ordered Stop   02/08/21 2200  doxycycline (VIBRA-TABS) tablet 100 mg        100 mg Oral Every 12 hours 02/08/21 1718 02/21/21 2359       Subjective:  Patient seen and examined the bedside this morning.  Hemodynamically stable.  Complains of problem with hearing on the left ear which he said has been present since he was here but did not complain to me earlier.  No discharge or pain on the ear.  Objective: Vitals:   02/21/21 1950 02/22/21 0047 02/22/21 0147 02/22/21 0513  BP: (!) 141/96 (!) 182/93 (!) 151/91 (!) 139/91  Pulse: 69 73 75 72  Resp: '18 14 18 17  '$ Temp: 97.7 F (36.5 C) 98 F (36.7 C)  98.5 F (36.9 C) 98.8 F (37.1 C)  TempSrc: Oral  Oral Oral  SpO2: 100% 98% 98% 100%  Weight:   100.4 kg   Height:        Intake/Output Summary (Last 24 hours) at 02/22/2021 0747 Last data filed at 02/22/2021 0600 Gross per 24 hour  Intake 200 ml  Output --  Net 200 ml    Filed Weights   02/20/21 0850 02/20/21 1235 02/22/21 0147  Weight: 102.8 kg 97.9 kg 100.4 kg    Examination:  General exam: Overall  comfortable, not in distress HEENT: legally blind Respiratory system:  no wheezes or crackles  Cardiovascular system: S1 & S2 heard, RRR.  Gastrointestinal system: Abdomen is nondistended, soft and nontender. Central nervous system: Alert and oriented Extremities: No edema, no clubbing ,no cyanosis, ulcer on the right middle toe of the foot, dialysis access on the left forearm Skin: No rashes, no ulcers,no icterus    Data Reviewed: I have personally reviewed following labs and imaging studies  CBC: Recent Labs  Lab 02/16/21 0654 02/17/21 1622 02/20/21 0900  WBC 5.7 5.4 5.1  HGB 10.1* 9.7* 9.1*  HCT 31.3* 30.2* 28.2*  MCV 81.3 82.1 81.7  PLT 281 295 [id number]   Basic Metabolic Panel: Recent Labs  Lab 02/16/21 0654 02/17/21 1623 02/18/21 0259 02/20/21 0900  NA 125* 125* 126* 128*  K 4.4 4.7 4.2 4.7  CL 90* 93* 91* 94*  CO2 23 20* 25 19*  GLUCOSE 366* 223* 392* 149*  BUN 65* 85* 51* 102*  CREATININE 9.99* 11.87* 8.22* 11.94*  CALCIUM 8.1* 8.1* 8.1* 8.1*  PHOS 4.9* 5.3* 4.0 5.7*   GFR: Estimated Creatinine Clearance: 10.5 mL/min (A) (by C-G formula based on SCr of 11.94 mg/dL (H)). Liver Function Tests: Recent Labs  Lab 02/16/21 0654 02/17/21 1623 02/18/21 0259 02/20/21 0900  ALBUMIN 2.7* 2.8* 2.8* 2.9*   No results for input(s): LIPASE, AMYLASE in the last 168 hours. No results for input(s): AMMONIA in the last 168 hours. Coagulation Profile: No results for input(s): INR, PROTIME in the last 168 hours. Cardiac Enzymes: No results for input(s): CKTOTAL, CKMB, CKMBINDEX, TROPONINI in the last 168 hours. BNP (last 3 results) No results for input(s): PROBNP in the last 8760 hours. HbA1C: No results for input(s): HGBA1C in the last 72 hours. CBG: Recent Labs  Lab 02/21/21 0647 02/21/21 1251 02/21/21 1640 02/21/21 2144 02/22/21 0638  GLUCAP 168* 157* 132* 227* 74   Lipid Profile: No results for input(s): CHOL, HDL, LDLCALC, TRIG, CHOLHDL, LDLDIRECT in the last  72 hours. Thyroid Function Tests: No results for input(s): TSH, T4TOTAL, FREET4, T3FREE, THYROIDAB in the last 72 hours. Anemia Panel: No results for input(s): VITAMINB12, FOLATE, FERRITIN, TIBC, IRON, RETICCTPCT in the last 72 hours. Sepsis Labs: No results for input(s): PROCALCITON, LATICACIDVEN in the last 168 hours.   No results found for this or any previous visit (from the past 240 hour(s)).        Radiology Studies: No results found.      Scheduled Meds:  atorvastatin  40 mg Oral Daily   carvedilol  25 mg Oral BID WC   Chlorhexidine Gluconate Cloth  6 each Topical Q0600   [START ON 02/23/2021] darbepoetin (ARANESP) injection - DIALYSIS  60 mcg Intravenous Q Mon-HD   doxercalciferol  5 mcg Intravenous Q M,W,F-HD   feeding supplement ([name] CARB STEADY)  237 mL Oral TID BM   heparin  5,000 Units Subcutaneous Q12H   imipramine  25 mg  Oral QHS   insulin aspart  0-5 Units Subcutaneous QHS   insulin aspart  0-6 Units Subcutaneous TID WC   insulin aspart  3 Units Subcutaneous TID WC   insulin glargine-yfgn  20 Units Subcutaneous QHS   labetalol  10 mg Intravenous Once   loratadine  10 mg Oral Daily   losartan  50 mg Oral QHS   multivitamin  1 tablet Oral QHS   sevelamer carbonate  1,600 mg Oral TID WC   sodium chloride flush  3 mL Intravenous Q12H   Continuous Infusions:  sodium chloride     promethazine (PHENERGAN) injection (IM or IVPB)       LOS: 14 days    Time spent: 15 mins.More than 50% of that time was spent in counseling and/or coordination of care.      Shelly Coss, MD [facility] P8/14/2022, 7:47 AM

## 2021-02-22 NOTE — Plan of Care (Signed)
  Problem: Clinical Measurements: Goal: Ability to maintain clinical measurements within normal limits will improve Outcome: Progressing   

## 2021-02-22 NOTE — Progress Notes (Signed)
[name]  Subjective: pt seen in room, on c/o's.    Vitals:   02/22/21 0047 02/22/21 0147 02/22/21 0513 02/22/21 0854  BP: (!) 182/93 (!) 151/91 (!) 139/91 133/78  Pulse: 73 75 72 68  Resp: '14 18 17 17  '$ Temp: 98 F (36.7 C) 98.5 F (36.9 C) 98.8 F (37.1 C) 98.3 F (36.8 C)  TempSrc:  Oral Oral   SpO2: 98% 98% 100% 99%  Weight:  100.4 kg    Height:        Exam:  alert, nad   no jvd  Chest cta bilat  Cor reg no RG  Abd soft ntnd no ascites   Ext 1+ pretib edema   Alert, NF, ox3  LFA AVF +bruit     CXR 8/14 - mild vasc congestion    OP HD: SW MWF  4h  450/800  86.5kg  2/2 bath  L AVF  Hep none -[name] 5 mcg IV TIW -Venofer 100 mcg IV X 10 doses 3/7 doses given last dose 02/04/21 -Mircera 225 mcg IV q 2 weeks (last dose 02/02/2021)  Assessment/ Plan Volume overload - pt quite vol overloaded by wts, have done standing wts and they corroborate. He does look overloaded on exam as well but w/o resp issues and CXR 8/14 vasc congestion only. Needs strict fluid restriction. Moved to renal floor for better dietary discipline. Max UF next HD.  ESRD - on HD MWF.  Had HD Tues / Friday this past week due to staffing issues. Next HD Monday to get back on schedule.  HHS: sp IV insulin on SQ insulin now AMS: resolved, thought to be related to elevated glucose +/- seizure. CT of head without acute changes/MRI neg as well. Neuro involved no anti sz meds req COVID 19- Per primary-  on RA at 100%, no medical treatment required. Pt is now off of COVID isolation R toe ulcer: On Doxycycline, WC consult per primary  Hypertensive Emergency: Medications and EDW adjusted at OP HD center last week. improved. UF with HD and continue to lower volume as tolerated. Coreg 12.5 BID-   cozaar 50 Torsemide 20 mg 2 tabs on non HD days.  BP's slightly high, but better. Suspect noncompliance with meds as OP  Anemia  - HGB 9.7--9.1. ESA here weekly   Metabolic bone disease  -  C Ca OK, PO4 elevated. Increase Renvela to 2 tabs PO TID AC-  phos better. Continue VDRA.   Nutrition -Albumin 2.5. Renal Carb mod diet, add nepro, renal vits. Disposition -  will likely need SNF placement since his aunt can no longer care for him.  SW/CM involved, no plan yet.      Rob Diangelo Radel 02/22/2021, 12:43 PM   Recent Labs  Lab 02/17/21 1622 02/17/21 1623 02/18/21 0259 02/20/21 0900  K  --    < > 4.2 4.7  BUN  --    < > 51* 102*  CREATININE  --    < > 8.22* 11.94*  CALCIUM  --    < > 8.1* 8.1*  PHOS  --    < > 4.0 5.7*  HGB 9.7*  --   --  9.1*   < > = values in this interval not displayed.    Inpatient medications:  atorvastatin  40 mg Oral Daily   carvedilol  25 mg Oral BID WC   Chlorhexidine Gluconate Cloth  6 each Topical Q0600   [START ON 02/23/2021] darbepoetin (ARANESP) injection -  DIALYSIS  60 mcg Intravenous Q Mon-HD   doxercalciferol  5 mcg Intravenous Q M,W,F-HD   feeding supplement ([name] CARB STEADY)  237 mL Oral TID BM   heparin  5,000 Units Subcutaneous Q12H   imipramine  25 mg Oral QHS   insulin aspart  0-5 Units Subcutaneous QHS   insulin aspart  0-6 Units Subcutaneous TID WC   insulin aspart  3 Units Subcutaneous TID WC   insulin glargine-yfgn  20 Units Subcutaneous QHS   labetalol  10 mg Intravenous Once   loratadine  10 mg Oral Daily   losartan  50 mg Oral QHS   multivitamin  1 tablet Oral QHS   sevelamer carbonate  1,600 mg Oral TID WC   sodium chloride flush  3 mL Intravenous Q12H    sodium chloride     promethazine (PHENERGAN) injection (IM or IVPB)     sodium chloride, acetaminophen, butalbital-acetaminophen-caffeine, dextrose, hydrALAZINE, hydrOXYzine, promethazine (PHENERGAN) injection (IM or IVPB), sodium chloride flush

## 2021-02-22 NOTE — Progress Notes (Signed)
Received transfer patient from 3W to [phone] via wheelchair. Patient is alert and oriented x4, NAD, assisted to to bed, oriented to room and staff. Needs addressed. Call bell and room phone placed within reach.

## 2021-02-23 DIAGNOSIS — R569 Unspecified convulsions: Secondary | ICD-10-CM | POA: Diagnosis not present

## 2021-02-23 LAB — GLUCOSE, CAPILLARY
Glucose-Capillary: 119 mg/dL — ABNORMAL HIGH (ref 70–99)
Glucose-Capillary: 156 mg/dL — ABNORMAL HIGH (ref 70–99)
Glucose-Capillary: 69 mg/dL — ABNORMAL LOW (ref 70–99)
Glucose-Capillary: 156 mg/dL — ABNORMAL HIGH (ref 70–99)
Glucose-Capillary: 232 mg/dL — ABNORMAL HIGH (ref 70–99)

## 2021-02-23 LAB — BASIC METABOLIC PANEL
Anion gap: 13 (ref 5–15)
BUN: 102 mg/dL — ABNORMAL HIGH (ref 6–20)
CO2: 22 mmol/L (ref 22–32)
Calcium: 8 mg/dL — ABNORMAL LOW (ref 8.9–10.3)
Chloride: 92 mmol/L — ABNORMAL LOW (ref 98–111)
Creatinine, Ser: 12.42 mg/dL — ABNORMAL HIGH (ref 0.61–1.24)
GFR, Estimated: 5 mL/min — ABNORMAL LOW (ref >60)
Glucose, Bld: 207 mg/dL — ABNORMAL HIGH (ref 70–99)
Potassium: 5.5 mmol/L — ABNORMAL HIGH (ref 3.5–5.1)
Sodium: 127 mmol/L — ABNORMAL LOW (ref 135–145)

## 2021-02-23 MED ORDER — CIPROFLOXACIN-FLUOCINOLONE PF 0.3-0.025 % OT SOLN
0.2500 mL | Freq: Two times a day (BID) | OTIC | Status: AC
  Administered 2021-02-23 – 2021-02-27 (×7): 0.25 mL via OTIC
  Filled 2021-02-23 (×2): qty 3.5

## 2021-02-23 MED ORDER — AMOXICILLIN-POT CLAVULANATE 500-125 MG PO TABS
1.0000 | ORAL_TABLET | ORAL | Status: AC
Start: 2021-02-23 — End: 2021-03-01
  Administered 2021-02-23 – 2021-03-01 (×7): 500 mg via ORAL
  Filled 2021-02-23 (×8): qty 1

## 2021-02-23 MED ORDER — DOXERCALCIFEROL 4 MCG/2ML IV SOLN
INTRAVENOUS | Status: AC
  Administered 2021-02-23: 5 ug via INTRAVENOUS
  Filled 2021-02-23: qty 4

## 2021-02-23 MED ORDER — AMOXICILLIN-POT CLAVULANATE 875-125 MG PO TABS: 1.0000 | ORAL_TABLET | Freq: Two times a day (BID) | ORAL | Status: DC

## 2021-02-23 MED ORDER — DARBEPOETIN ALFA 60 MCG/0.3ML IJ SOSY
PREFILLED_SYRINGE | INTRAMUSCULAR | Status: AC
  Administered 2021-02-23: 60 ug via INTRAVENOUS
  Filled 2021-02-23: qty 0.3

## 2021-02-23 NOTE — Progress Notes (Signed)
PHARMACY NOTE:  ANTIMICROBIAL RENAL DOSAGE ADJUSTMENT  Current antimicrobial regimen includes a mismatch between antimicrobial dosage and estimated renal function.  As per policy approved by the Pharmacy & Therapeutics and Medical Executive Committees, the antimicrobial dosage will be adjusted accordingly.  Current antimicrobial dosage:  Augmentin 875 mg PO BID x 7 days  Indication: Toe ulcer/ear infection  Renal Function:  Estimated Creatinine Clearance: 10.1 mL/min (A) (by C-G formula based on SCr of 12.42 mg/dL (H)). '[x]'$      On intermittent HD, scheduled: '[]'$      On CRRT    Antimicrobial dosage has been changed to:  Augmentin '500mg'$  PO q24h x 7 days  Additional comments:   Jaeveon Ashland A. Levada Dy, PharmD, BCPS, FNKF Clinical Pharmacist [place] Please utilize Amion for appropriate phone number to reach the unit pharmacist ([place])  02/23/2021 1:38 PM

## 2021-02-23 NOTE — Progress Notes (Signed)
PROGRESS NOTE    [name]  [id number] DOB: 1985-12-21 DOA: 02/08/2021 PCP: Gildardo Pounds, NP   Chief Complain: Seizures  Brief Narrative:  Patient is a 35 year old male with history of ESRD on dialysis,IDDM on insulin, HTN, HLD, legally blind, presented with syncope versus seizure.  Blood glucose were severely elevated on presentation.  He was thought to have seizures from elevated blood glucose levels.  Found to have incidental positive for COVID, no respiratory issues.  He used to live with his aunt but she is unable to take him back home.  TOC on board and awaiting for disposition plan.  Medically stable for discharge whenever possible.  Assessment & Plan:   Principal Problem:   Seizure-like activity ([place]) Active Problems:   Essential hypertension   DM2 (diabetes mellitus, type 2) (HCC)   Anemia of chronic kidney failure   Diabetic foot ulcer ([place])   Syncope   DKA (diabetic ketoacidosis) ([place])   Acute metabolic encephalopathy: Initially thought to have seizure from hyperglycemia.  EEG with questionable dysfunction in the left temporal region.  MRI was negative.  Encephalopathy has resolved.  Currently alert and oriented.  Patient is legally blind.  Hyperglycemia/insulin-dependent diabetes type 2: Elevated blood glucose on presentation. Monitor blood sugars, Continue current insulin regimen.  Diabetic coordinator following.  Incidental COVID-positive: Asymptomatic.  Initial chest x-ray was consistent with volume overload.  Received 1 dose of monoclonal antibody on admission.  Off isolation  Acute otitis media of the right ear: Complained of fullness, discomfort of right ear.  Otoscopic examination done at the bedside with finding of scant discharge along with erythematous inflammation of the tympanic membrane.  Started on Augmentin.  Also started on ciprofloxacin eardrops  ESRD on dialysis: Nephrology following.  Volume management as per dialysis.  Chronic combined  congestive heart failure: Echo obtained during this hospitalization showed EF of 45%, needs outpatient cardiology follow-up.  Volume management as per dialysis  Right toe ulcer: HE was treated with 14 days course of  ,follows with  podiatry.  Recommend outpatient follow-up.  Hypertension: Continue current medications.  Monitor blood pressure.  Disposition: Patient is medically stable for discharge.  Previously was living with aunt but she is not able to take him back.  Disposition issue.  TOC following        Nutrition Problem: Increased nutrient needs Etiology: chronic illness (ESRD on HD)      DVT prophylaxis:Heparin  Code Status: Full Family Communication: None at bedside Status is: Inpatient  Remains inpatient appropriate because:Inpatient level of care appropriate due to severity of illness  Dispo: The patient is from: Home              Anticipated d/c is to:Not sure              Patient currently is medically stable to d/c.   Difficult to place patient Yes     Consultants: Nephrology  Procedures:Dialysis  Antimicrobials:  Anti-infectives (From admission, onward)    Start     Dose/Rate Route Frequency Ordered Stop   02/08/21 2200  doxycycline (VIBRA-TABS) tablet 100 mg        100 mg Oral Every 12 hours 02/08/21 1718 02/21/21 2359       Subjective:  Patient seen and examined the bedside this morning.  Hemodynamically stable.  Continues to complain of pain, decreased hearing of the left ear.  Left ear examined at the bedside with otoscope with finding of otitis media.  Objective: Vitals:   02/22/21  AR:[id number] 02/22/21 1826 02/22/21 2013 02/23/21 0524  BP: 133/78 131/82 (!) 167/104 138/87  Pulse: 68 71 67 67  Resp: '17 18 16 16  '$ Temp: 98.3 F (36.8 C) 98.2 F (36.8 C) 97.7 F (36.5 C) 98.3 F (36.8 C)  TempSrc:   Oral Oral  SpO2: 99% 99% 99% 94%  Weight:    100.4 kg  Height:        Intake/Output Summary (Last 24 hours) at 02/23/2021 0810 Last data  filed at 02/22/2021 1800 Gross per 24 hour  Intake 600 ml  Output --  Net 600 ml    Filed Weights   02/20/21 1235 02/22/21 0147 02/23/21 0524  Weight: 97.9 kg 100.4 kg 100.4 kg    Examination:  General exam: Overall comfortable, not in distress HEENT: Legally blind, inflamed tympanic membrane, discharge on the external auditory canal Respiratory system:  no wheezes or crackles  Cardiovascular system: S1 & S2 heard, RRR.  Gastrointestinal system: Abdomen is nondistended, soft and nontender. Central nervous system: Alert and oriented Extremities: No edema, no clubbing ,no cyanosis,ulcer of middle toe of the right  foot Skin: No rashes, no ulcers,no icterus    Data Reviewed: I have personally reviewed following labs and imaging studies  CBC: Recent Labs  Lab 02/17/21 1622 02/20/21 0900  WBC 5.4 5.1  HGB 9.7* 9.1*  HCT 30.2* 28.2*  MCV 82.1 81.7  PLT 295 [id number]   Basic Metabolic Panel: Recent Labs  Lab 02/17/21 1623 02/18/21 0259 02/20/21 0900  NA 125* 126* 128*  K 4.7 4.2 4.7  CL 93* 91* 94*  CO2 20* 25 19*  GLUCOSE 223* 392* 149*  BUN 85* 51* 102*  CREATININE 11.87* 8.22* 11.94*  CALCIUM 8.1* 8.1* 8.1*  PHOS 5.3* 4.0 5.7*   GFR: Estimated Creatinine Clearance: 10.5 mL/min (A) (by C-G formula based on SCr of 11.94 mg/dL (H)). Liver Function Tests: Recent Labs  Lab 02/17/21 1623 02/18/21 0259 02/20/21 0900  ALBUMIN 2.8* 2.8* 2.9*   No results for input(s): LIPASE, AMYLASE in the last 168 hours. No results for input(s): AMMONIA in the last 168 hours. Coagulation Profile: No results for input(s): INR, PROTIME in the last 168 hours. Cardiac Enzymes: No results for input(s): CKTOTAL, CKMB, CKMBINDEX, TROPONINI in the last 168 hours. BNP (last 3 results) No results for input(s): PROBNP in the last 8760 hours. HbA1C: No results for input(s): HGBA1C in the last 72 hours. CBG: Recent Labs  Lab 02/21/21 2144 02/22/21 0638 02/22/21 1247 02/22/21 2159  02/23/21 0700  GLUCAP 227* 74 195* 257* 119*   Lipid Profile: No results for input(s): CHOL, HDL, LDLCALC, TRIG, CHOLHDL, LDLDIRECT in the last 72 hours. Thyroid Function Tests: No results for input(s): TSH, T4TOTAL, FREET4, T3FREE, THYROIDAB in the last 72 hours. Anemia Panel: No results for input(s): VITAMINB12, FOLATE, FERRITIN, TIBC, IRON, RETICCTPCT in the last 72 hours. Sepsis Labs: No results for input(s): PROCALCITON, LATICACIDVEN in the last 168 hours.   No results found for this or any previous visit (from the past 240 hour(s)).        Radiology Studies: DG Chest 2 View  Result Date: 02/22/2021 CLINICAL DATA:  Volume overload.  End-stage renal disease. EXAM: CHEST - 2 VIEW COMPARISON:  February 08, 2021 FINDINGS: Stable cardiomegaly. The hila and mediastinum are unchanged. No pneumothorax. No nodules or masses. No focal infiltrates. No overt edema. IMPRESSION: No overt edema.  Suggested mild pulmonary venous congestion. Electronically Signed   By: Dorise Bullion III M.D.   On:  02/22/2021 09:42        Scheduled Meds:  atorvastatin  40 mg Oral Daily   carvedilol  25 mg Oral BID WC   Chlorhexidine Gluconate Cloth  6 each Topical Q0600   darbepoetin (ARANESP) injection - DIALYSIS  60 mcg Intravenous Q Mon-HD   doxercalciferol  5 mcg Intravenous Q M,W,F-HD   feeding supplement ([name] CARB STEADY)  237 mL Oral TID BM   heparin  5,000 Units Subcutaneous Q12H   imipramine  25 mg Oral QHS   insulin aspart  0-5 Units Subcutaneous QHS   insulin aspart  0-6 Units Subcutaneous TID WC   insulin aspart  3 Units Subcutaneous TID WC   insulin glargine-yfgn  20 Units Subcutaneous QHS   labetalol  10 mg Intravenous Once   loratadine  10 mg Oral Daily   losartan  50 mg Oral QHS   multivitamin  1 tablet Oral QHS   sevelamer carbonate  1,600 mg Oral TID WC   sodium chloride flush  3 mL Intravenous Q12H   Continuous Infusions:  sodium chloride     promethazine (PHENERGAN)  injection (IM or IVPB)       LOS: 15 days    Time spent: 15 mins.More than 50% of that time was spent in counseling and/or coordination of care.      Shelly Coss, MD [facility] P8/15/2022, 8:10 AM

## 2021-02-23 NOTE — Progress Notes (Signed)
[facility] Progress Note  Subjective: Seen in room. No new events overnight. Does endorse some sob today.    Vitals:   02/22/21 1826 02/22/21 2013 02/23/21 0524 02/23/21 0948  BP: 131/82 (!) 167/104 138/87 (!) 152/99  Pulse: 71 67 67 68  Resp: '18 16 16 19  '$ Temp: 98.2 F (36.8 C) 97.7 F (36.5 C) 98.3 F (36.8 C) 97.8 F (36.6 C)  TempSrc:  Oral Oral Oral  SpO2: 99% 99% 94% 96%  Weight:   100.4 kg   Height:        Exam: General: well appearing, nad Heart: RRR, no m,r,g Lungs: Clear, bilaterally, normal WOB Abdomen: soft non-tender Extremities: No sig LE edema Access: L forearm AVF +bruit       CXR 8/14 - mild vasc congestion    OP HD: SW MWF  4h  450/800  86.5kg  2/2 bath  L AVF  Hep none -[name] 5 mcg IV TIW -Venofer 100 mcg IV X 10 doses 3/7 doses given last dose 02/04/21 -Mircera 225 mcg IV q 2 weeks (last dose 02/02/2021)  Assessment/ Plan Volume overload - pt quite vol overloaded by wts, have done standing wts and they corroborate. Needs strict fluid restriction. Moved to renal floor for better dietary discipline. Max UF next HD.  ESRD - on HD MWF.  Had HD Tues / Friday this past week due to staffing issues. Back on schedule HD today 8/15.  HHS: sp IV insulin on SQ insulin now AMS: resolved, thought to be related to elevated glucose +/- seizure. CT of head without acute changes/MRI neg as well. Neuro involved no anti sz meds req COVID 19- Pt is now off of COVID isolation R toe ulcer: On Doxycycline, WC consult per primary  HTN: Medications and EDW adjusted at OP HD center last week. improved. UF with HD and continue to lower volume as tolerated. Coreg 12.5 BID-   cozaar 50 Torsemide 20 mg 2 tabs on non HD days.  BP's slightly high, but better. Suspect noncompliance with meds as OP  Anemia  - Hgb 9s. Aranesp 60 q week starting 8/15.   Metabolic bone disease -  Ca ok. Increase Renvela to 2 tabs PO TID AC-  phos better. Continue VDRA.   Nutrition  -Renal Carb mod diet, add nepro, renal vits. Disposition -  Unclear. Prev living with aunt but unable to care for him. SW/CM involved, no plan yet.     Lynnda Child PA-C [facility] 02/23/2021,10:32 AM    Recent Labs  Lab 02/17/21 1622 02/17/21 1623 02/18/21 0259 02/20/21 0900 02/23/21 0909  K  --    < > 4.2 4.7 5.5*  BUN  --    < > 51* 102* 102*  CREATININE  --    < > 8.22* 11.94* 12.42*  CALCIUM  --    < > 8.1* 8.1* 8.0*  PHOS  --    < > 4.0 5.7*  --   HGB 9.7*  --   --  9.1*  --    < > = values in this interval not displayed.    Inpatient medications:  atorvastatin  40 mg Oral Daily   carvedilol  25 mg Oral BID WC   Chlorhexidine Gluconate Cloth  6 each Topical Q0600   darbepoetin (ARANESP) injection - DIALYSIS  60 mcg Intravenous Q Mon-HD   doxercalciferol  5 mcg Intravenous Q M,W,F-HD   feeding supplement ([name] CARB STEADY)  237 mL Oral TID BM  heparin  5,000 Units Subcutaneous Q12H   imipramine  25 mg Oral QHS   insulin aspart  0-5 Units Subcutaneous QHS   insulin aspart  0-6 Units Subcutaneous TID WC   insulin aspart  3 Units Subcutaneous TID WC   insulin glargine-yfgn  20 Units Subcutaneous QHS   labetalol  10 mg Intravenous Once   loratadine  10 mg Oral Daily   losartan  50 mg Oral QHS   multivitamin  1 tablet Oral QHS   sevelamer carbonate  1,600 mg Oral TID WC   sodium chloride flush  3 mL Intravenous Q12H    sodium chloride     promethazine (PHENERGAN) injection (IM or IVPB)     sodium chloride, acetaminophen, butalbital-acetaminophen-caffeine, dextrose, hydrALAZINE, hydrOXYzine, promethazine (PHENERGAN) injection (IM or IVPB), sodium chloride flush

## 2021-02-23 NOTE — Progress Notes (Signed)
Pt off unit to hemodialysis. 

## 2021-02-23 NOTE — Progress Notes (Signed)
Hypoglycemic Event  CBG: 69  Treatment: 8 oz juice/soda, dinner eaten  Symptoms: None  Follow-up CBG: Time:1829 CBG Result:156  Possible Reasons for Event: Inadequate meal intake  Comments/MD notified: hypoglycemic protocol followed    Jalynn Betzold S Akasia Ahmad

## 2021-02-24 DIAGNOSIS — R569 Unspecified convulsions: Secondary | ICD-10-CM | POA: Diagnosis not present

## 2021-02-24 LAB — GLUCOSE, CAPILLARY
Glucose-Capillary: 62 mg/dL — ABNORMAL LOW (ref 70–99)
Glucose-Capillary: 156 mg/dL — ABNORMAL HIGH (ref 70–99)
Glucose-Capillary: 170 mg/dL — ABNORMAL HIGH (ref 70–99)
Glucose-Capillary: 208 mg/dL — ABNORMAL HIGH (ref 70–99)
Glucose-Capillary: 233 mg/dL — ABNORMAL HIGH (ref 70–99)

## 2021-02-24 MED ORDER — PROSOURCE PLUS PO LIQD
30.0000 mL | Freq: Two times a day (BID) | ORAL | Status: DC
  Administered 2021-02-28: 30 mL via ORAL
  Filled 2021-02-24 (×4): qty 30

## 2021-02-24 NOTE — Plan of Care (Signed)
  Problem: Education: Goal: Knowledge of General Education information will improve Description Including pain rating scale, medication(s)/side effects and non-pharmacologic comfort measures Outcome: Progressing   Problem: Health Behavior/Discharge Planning: Goal: Ability to manage health-related needs will improve Outcome: Progressing   

## 2021-02-24 NOTE — Progress Notes (Signed)
CSW attempted to reach patient's aunt Angela Nevin twice without success - no voicemail option available so a text message was sent requesting a return call.  Madilyn Fireman, MSW, LCSW Transitions of Care  Clinical Social Worker II [phone]

## 2021-02-24 NOTE — Progress Notes (Signed)
[facility] Progress Note  Subjective: Completed dialysis yesterday. UF goal 5L  No complaints this am. Denies sob, orthopnea.   Vitals:   02/23/21 1850 02/23/21 2124 02/24/21 0616 02/24/21 0811  BP: (!) 155/76 (!) 143/86 (!) 153/95 (!) 159/97  Pulse: 81 76 75 77  Resp: '18 18 18 18  '$ Temp: 98.1 F (36.7 C) 98.4 F (36.9 C) 98.3 F (36.8 C) 99.4 F (37.4 C)  TempSrc: Oral   Oral  SpO2: 100% 94% 100% 94%  Weight:  100.5 kg    Height:        Exam: General: well appearing, nad Heart: RRR, no m,r,g Lungs: Clear, bilaterally, normal WOB Abdomen: soft non-tender Extremities: No sig LE edema Access: L forearm AVF +bruit       CXR 8/14 - mild vasc congestion    OP HD: SW MWF  4h  450/800  86.5kg  2/2 bath  L AVF  Hep none -[name] 5 mcg IV TIW -Venofer 100 mcg IV X 10 doses 3/7 doses given last dose 02/04/21 -Mircera 225 mcg IV q 2 weeks (last dose 02/02/2021)  Assessment/ Plan Volume overload - pt quite vol overloaded by wts, have done standing wts and they corroborate. Needs strict fluid restriction. Moved to renal floor for better dietary discipline. Max UF as tolerated to get volume down.  ESRD - on HD MWF.   Back on schedule. Next HD 8/17  HHS: sp IV insulin on SQ insulin now AMS: resolved, thought to be related to elevated glucose +/- seizure. CT of head without acute changes/MRI neg as well. Neuro involved no anti sz meds req COVID 19- Pt is now off COVID isolation R toe ulcer: On Doxycycline, WC consult per primary  HTN:  Cont home meds. On Coreg 25 bid, Cozaar 50. UF with HD and continue to lower volume as tolerated.   Anemia  - Hgb 9s. Aranesp 60 q week starting 8/15.   Metabolic bone disease -  Ca ok. Increase Renvela to 2 tabs PO TID AC-  phos better. Continue VDRA.   Nutrition -Renal Carb mod diet, add nepro, renal vits. Disposition -  Unclear. Prev living with aunt but unable to care for him. SW/CM involved, no plan yet.     Lynnda Child PA-C [facility] 02/24/2021,10:11 AM    Recent Labs  Lab 02/17/21 1622 02/17/21 1623 02/18/21 0259 02/20/21 0900 02/23/21 0909  K  --    < > 4.2 4.7 5.5*  BUN  --    < > 51* 102* 102*  CREATININE  --    < > 8.22* 11.94* 12.42*  CALCIUM  --    < > 8.1* 8.1* 8.0*  PHOS  --    < > 4.0 5.7*  --   HGB 9.7*  --   --  9.1*  --    < > = values in this interval not displayed.    Inpatient medications:  amoxicillin-clavulanate  1 tablet Oral Q24H   atorvastatin  40 mg Oral Daily   carvedilol  25 mg Oral BID WC   Chlorhexidine Gluconate Cloth  6 each Topical Q0600   ciprofloxacin-fluocinolone PF  0.25 mL Right EAR BID   darbepoetin (ARANESP) injection - DIALYSIS  60 mcg Intravenous Q Mon-HD   doxercalciferol  5 mcg Intravenous Q M,W,F-HD   feeding supplement ([name] CARB STEADY)  237 mL Oral TID BM   heparin  5,000 Units Subcutaneous Q12H   imipramine  25 mg Oral  QHS   insulin aspart  0-5 Units Subcutaneous QHS   insulin aspart  0-6 Units Subcutaneous TID WC   insulin glargine-yfgn  20 Units Subcutaneous QHS   labetalol  10 mg Intravenous Once   loratadine  10 mg Oral Daily   losartan  50 mg Oral QHS   multivitamin  1 tablet Oral QHS   sevelamer carbonate  1,600 mg Oral TID WC   sodium chloride flush  3 mL Intravenous Q12H    sodium chloride     promethazine (PHENERGAN) injection (IM or IVPB)     sodium chloride, acetaminophen, butalbital-acetaminophen-caffeine, dextrose, hydrALAZINE, hydrOXYzine, promethazine (PHENERGAN) injection (IM or IVPB), sodium chloride flush

## 2021-02-24 NOTE — Progress Notes (Signed)
PROGRESS NOTE    [name]  [id number] DOB: February 09, 1986 DOA: 02/08/2021 PCP: Gildardo Pounds, NP   Chief Complain: Seizures  Brief Narrative:  Patient is a 35 year old male with history of ESRD on dialysis,IDDM on insulin, HTN, HLD, legally blind, presented with syncope versus seizure.  Blood glucose were severely elevated on presentation.  He was thought to have seizures from elevated blood glucose levels.  Found to have incidental positive for COVID, no respiratory issues.  He used to live with his aunt but she is unable to take him back home.  TOC on board and awaiting for disposition plan.  Medically stable for discharge whenever possible.Now LLOS  Assessment & Plan:   Principal Problem:   Seizure-like activity ([place]) Active Problems:   Essential hypertension   DM2 (diabetes mellitus, type 2) (HCC)   Anemia of chronic kidney failure   Diabetic foot ulcer ([place])   Syncope   DKA (diabetic ketoacidosis) ([place])   Acute metabolic encephalopathy: Initially thought to have seizure from hyperglycemia.  EEG with questionable dysfunction in the left temporal region.  MRI was negative.  Encephalopathy has resolved.  Currently alert and oriented.  Patient is legally blind.  Hyperglycemia/insulin-dependent diabetes type 2: Elevated blood glucose on presentation. Monitor blood sugars, Continue current insulin regimen.  Diabetic coordinator following.  Incidental COVID-positive: Asymptomatic.  Initial chest x-ray was consistent with volume overload.  Received 1 dose of monoclonal antibody on admission.  Off isolation  Acute otitis media of the right ear: Complained of fullness, discomfort of right ear.  Otoscopic examination done at the bedside with finding of scant discharge along with erythematous inflammation of the tympanic membrane.  Started on Augmentin.  Also started on ciprofloxacin eardrops.Feels better today  ESRD on dialysis: Nephrology following.  Volume management as per  dialysis.  Chronic combined congestive heart failure: Echo obtained during this hospitalization showed EF of 45%, needs outpatient cardiology follow-up.  Volume management as per dialysis  Right toe ulcer: He was treated with 14 days course of doxycycline ,follows with  podiatry.  Recommend outpatient follow-up.  Right middle toe also has healed very well  Hypertension: Continue current medications.  Monitor blood pressure.  Disposition: Patient is medically stable for discharge.  Previously was living with aunt but she is not able to take him back.  Disposition issue.  TOC following        Nutrition Problem: Increased nutrient needs Etiology: chronic illness (ESRD on HD)      DVT prophylaxis:Heparin [place] Code Status: Full Family Communication: None at bedside Status is: Inpatient  Remains inpatient appropriate because:Inpatient level of care appropriate due to severity of illness  Dispo: The patient is from: Home              Anticipated d/c is to:Not sure              Patient currently is medically stable to d/c.   Difficult to place patient Yes     Consultants: Nephrology  Procedures:Dialysis  Antimicrobials:  Anti-infectives (From admission, onward)    Start     Dose/Rate Route Frequency Ordered Stop   02/23/21 1430  amoxicillin-clavulanate (AUGMENTIN) 875-125 MG per tablet 1 tablet  Status:  Discontinued        1 tablet Oral Every 12 hours 02/23/21 1334 02/23/21 1335   02/23/21 1430  amoxicillin-clavulanate (AUGMENTIN) 500-125 MG per tablet 500 mg        1 tablet Oral Every 24 hours 02/23/21 1335 03/02/21 1429  02/08/21 2200  doxycycline (VIBRA-TABS) tablet 100 mg        100 mg Oral Every 12 hours 02/08/21 1718 02/21/21 2359       Subjective:  Patient seen and examined the bedside this morning.  Hemodynamically stable.  Denies any complaints today.  Right ear pain, fullness better today  Objective: Vitals:   02/23/21 1818 02/23/21 1850 02/23/21 2124  02/24/21 0616  BP: (!) 203/114 (!) 155/76 (!) 143/86 (!) 153/95  Pulse: 75 81 76 75  Resp: '20 18 18 18  '$ Temp: 100 F (37.8 C) 98.1 F (36.7 C) 98.4 F (36.9 C) 98.3 F (36.8 C)  TempSrc: Oral Oral    SpO2: 98% 100% 94% 100%  Weight:   100.5 kg   Height:        Intake/Output Summary (Last 24 hours) at 02/24/2021 0800 Last data filed at 02/24/2021 0616 Gross per 24 hour  Intake 900 ml  Output 0 ml  Net 900 ml    Filed Weights   02/23/21 0524 02/23/21 1334 02/23/21 2124  Weight: 100.4 kg 100.5 kg 100.5 kg    Examination:  General exam: Overall comfortable, not in distress,obese HEENT: PERRL Respiratory system:  no wheezes or crackles  Cardiovascular system: S1 & S2 heard, RRR.  Gastrointestinal system: Abdomen is nondistended, soft and nontender. Central nervous system: Alert and oriented Extremities: No edema, no clubbing ,no cyanosis,healed/dry ulcer on right middle toe Skin: No rashes, no ulcers,no icterus    Data Reviewed: I have personally reviewed following labs and imaging studies  CBC: Recent Labs  Lab 02/17/21 1622 02/20/21 0900  WBC 5.4 5.1  HGB 9.7* 9.1*  HCT 30.2* 28.2*  MCV 82.1 81.7  PLT 295 [id number]   Basic Metabolic Panel: Recent Labs  Lab 02/17/21 1623 02/18/21 0259 02/20/21 0900 02/23/21 0909  NA 125* 126* 128* 127*  K 4.7 4.2 4.7 5.5*  CL 93* 91* 94* 92*  CO2 20* 25 19* 22  GLUCOSE 223* 392* 149* 207*  BUN 85* 51* 102* 102*  CREATININE 11.87* 8.22* 11.94* 12.42*  CALCIUM 8.1* 8.1* 8.1* 8.0*  PHOS 5.3* 4.0 5.7*  --    GFR: Estimated Creatinine Clearance: 10.1 mL/min (A) (by C-G formula based on SCr of 12.42 mg/dL (H)). Liver Function Tests: Recent Labs  Lab 02/17/21 1623 02/18/21 0259 02/20/21 0900  ALBUMIN 2.8* 2.8* 2.9*   No results for input(s): LIPASE, AMYLASE in the last 168 hours. No results for input(s): AMMONIA in the last 168 hours. Coagulation Profile: No results for input(s): INR, PROTIME in the last 168  hours. Cardiac Enzymes: No results for input(s): CKTOTAL, CKMB, CKMBINDEX, TROPONINI in the last 168 hours. BNP (last 3 results) No results for input(s): PROBNP in the last 8760 hours. HbA1C: No results for input(s): HGBA1C in the last 72 hours. CBG: Recent Labs  Lab 02/23/21 1814 02/23/21 1848 02/23/21 2124 02/24/21 0640 02/24/21 0734  GLUCAP 69* 156* 232* 62* 156*   Lipid Profile: No results for input(s): CHOL, HDL, LDLCALC, TRIG, CHOLHDL, LDLDIRECT in the last 72 hours. Thyroid Function Tests: No results for input(s): TSH, T4TOTAL, FREET4, T3FREE, THYROIDAB in the last 72 hours. Anemia Panel: No results for input(s): VITAMINB12, FOLATE, FERRITIN, TIBC, IRON, RETICCTPCT in the last 72 hours. Sepsis Labs: No results for input(s): PROCALCITON, LATICACIDVEN in the last 168 hours.   No results found for this or any previous visit (from the past 240 hour(s)).        Radiology Studies: No results found.  Scheduled Meds:  amoxicillin-clavulanate  1 tablet Oral Q24H   atorvastatin  40 mg Oral Daily   carvedilol  25 mg Oral BID WC   Chlorhexidine Gluconate Cloth  6 each Topical Q0600   ciprofloxacin-fluocinolone PF  0.25 mL Right EAR BID   darbepoetin (ARANESP) injection - DIALYSIS  60 mcg Intravenous Q Mon-HD   doxercalciferol  5 mcg Intravenous Q M,W,F-HD   feeding supplement ([name] CARB STEADY)  237 mL Oral TID BM   heparin  5,000 Units Subcutaneous Q12H   imipramine  25 mg Oral QHS   insulin aspart  0-5 Units Subcutaneous QHS   insulin aspart  0-6 Units Subcutaneous TID WC   insulin aspart  3 Units Subcutaneous TID WC   insulin glargine-yfgn  20 Units Subcutaneous QHS   labetalol  10 mg Intravenous Once   loratadine  10 mg Oral Daily   losartan  50 mg Oral QHS   multivitamin  1 tablet Oral QHS   sevelamer carbonate  1,600 mg Oral TID WC   sodium chloride flush  3 mL Intravenous Q12H   Continuous Infusions:  sodium chloride     promethazine (PHENERGAN)  injection (IM or IVPB)       LOS: 16 days    Time spent: 15 mins.More than 50% of that time was spent in counseling and/or coordination of care.      Shelly Coss, MD [facility] P8/16/2022, 8:00 AM

## 2021-02-24 NOTE — Progress Notes (Signed)
Nutrition Follow-up  DOCUMENTATION CODES:   Not applicable  INTERVENTION:  -d/c [name] shake -PROSource PLUS PO 67ms BID, each supplement provides 100 kcals and 15 grams of protein -Continue renal mvi daily  NUTRITION DIAGNOSIS:   Increased nutrient needs related to chronic illness (ESRD on HD) as evidenced by estimated needs. -- ongoing  GOAL:   Patient will meet greater than or equal to 90% of their needs -- progressing  MONITOR:   PO intake, Weight trends, Supplement acceptance, Labs, I & O's  REASON FOR ASSESSMENT:   Consult Assessment of nutrition requirement/status, Other (Comment) (MD consult says "call pt's caregiver/aunt about his diet")  ASSESSMENT:   Pt with PMH significant for ESRD on HD, IDDM off insulin, HTN, HLD, and legally blind presented with AMS 2/2 HHS/DKA with coma vs syncope vs seizure. Incidental finding of COVID+.  Per MD, pt is hemodynamically stable and continues to be ready for discharge. Pt doing well with meals, but has been declining [name] per RN. Last 8 meal completions documented as 50-100% (~94% average meal intake). Will d/c [name] shakes given po intake is sufficient.   No UOP documented x24 hours  Last HD 8/15, net UF [id number] EDW: 86.5 kg Current weight: 100.5 kg Admit weight: 93.4 kg  Non-pitting edema noted to BLE per RN edema assessment  Medications: aranesp, nepro shakes TID, hectorol, SSI, novolog, semglee, rena-vit, renvela, IV phenergan Labs: Na 127 (L), K+ 5.5 (H), Cr 12.42 (H) CBGs: [id number] Order:   Diet Order             Diet renal/carb modified with fluid restriction Diet-HS Snack? Nothing; Fluid restriction: 1200 mL Fluid; Room service appropriate? No; Fluid consistency: Thin  Diet effective now                   EDUCATION NEEDS:   Education needs have been addressed  Skin:  Skin Assessment: Reviewed RN Assessment (non-pressure wounds on toes)  Last BM:  8/15  Height:   Ht Readings from Last  1 Encounters:  02/08/21 6' (1.829 m)    Weight:   Wt Readings from Last 1 Encounters:  02/23/21 100.5 kg    BMI:  Body mass index is 30.05 kg/m.  Estimated Nutritional Needs:   Kcal:  2150-2350  Protein:  105-120 grams  Fluid:  1L+UOP    AmLarkin InaMS, RD, LDN (she/her/hers) RD pager number and weekend/on-call pager number located in [facility]

## 2021-02-24 NOTE — Progress Notes (Signed)
Inpatient Diabetes Program Recommendations  AACE/ADA: New Consensus Statement on Inpatient Glycemic Control (2015)  Target Ranges:  Prepandial:   less than 140 mg/dL      Peak postprandial:   less than 180 mg/dL (1-2 hours)      Critically ill patients:  140 - 180 mg/dL   Lab Results  Component Value Date   GLUCAP 170 (H) 02/24/2021   HGBA1C 12.9 (H) 02/08/2021    Review of Glycemic Control Results for [name], [name] (MRN [id number]) as of 02/24/2021 12:28  Ref. Range 02/23/2021 21:24 02/24/2021 06:40 02/24/2021 07:34 02/24/2021 11:50  Glucose-Capillary Latest Ref Range: 70 - 99 mg/dL 232 (H) 62 (L) 156 (H) 170 (H)  Diabetes history: DM2 Outpatient Diabetes medications: Levemir 10 units daily (not taken in at least 4 months) Current orders for Inpatient glycemic control: Semglee 20 units QHS, Novlog 0-6 units TID with meals, Novolog 0-5 units QHS  Inpatient Diabetes Program Recommendations:    Consider slightly decreasing Semglee to 18 units QHS.  Thanks, Bronson Curb, MSN, RNC-OB Diabetes Coordinator [phone] (8a-5p)

## 2021-02-25 DIAGNOSIS — R569 Unspecified convulsions: Secondary | ICD-10-CM | POA: Diagnosis not present

## 2021-02-25 DIAGNOSIS — I422 Other hypertrophic cardiomyopathy: Secondary | ICD-10-CM

## 2021-02-25 LAB — GLUCOSE, CAPILLARY
Glucose-Capillary: 123 mg/dL — ABNORMAL HIGH (ref 70–99)
Glucose-Capillary: 142 mg/dL — ABNORMAL HIGH (ref 70–99)
Glucose-Capillary: 206 mg/dL — ABNORMAL HIGH (ref 70–99)
Glucose-Capillary: 262 mg/dL — ABNORMAL HIGH (ref 70–99)

## 2021-02-25 MED ORDER — DOXERCALCIFEROL 4 MCG/2ML IV SOLN
INTRAVENOUS | Status: AC
  Filled 2021-02-25: qty 4

## 2021-02-25 NOTE — Progress Notes (Signed)
[facility] PROGRESS NOTE  MARIS HASTON [id number] DOB: April 16, 1986 DOA: 02/08/2021 PCP: Gildardo Pounds, NP  Status:  Remains inpatient appropriate because:Unsafe d/c plan  Dispo: The patient is from: Home              Anticipated d/c is to:  Unknown- pt disability pending and o/w has no income-may need to look for group home              Patient currently is medically stable to d/c.   Difficult to place patient Yes    Level of care: Med-Surg  Code Status: Full Family Communication: Patient only DVT prophylaxis: Subcutaneous heparin COVID vaccination: Unknown  HPI: 35 year old male with history of ESRD on dialysis,IDDM on insulin, HTN, HLD, legally blind, presented with syncope versus seizure.  Blood glucose were severely elevated on presentation.  He was thought to have seizures from elevated blood glucose levels.  Found to have incidental positive for COVID, no respiratory issues.  He used to live with his aunt but she is unable to take him back home.    Subjective: Alert and sitting on side of bed.  Very frustrated that his aunt refuses to take him home.  States his OP disability case manager has questions for the inpatient social worker.  Objective: Vitals:   02/24/21 2100 02/25/21 0650  BP: 135/81 (!) 160/106  Pulse:  70  Resp:  18  Temp:  98.9 F (37.2 C)  SpO2:  96%    Intake/Output Summary (Last 24 hours) at 02/25/2021 0813 Last data filed at 02/25/2021 0156 Gross per 24 hour  Intake 1200 ml  Output --  Net 1200 ml   Filed Weights   02/23/21 0524 02/23/21 1334 02/23/21 2124  Weight: 100.4 kg 100.5 kg 100.5 kg    Exam:  Constitutional: NAD, calm, comfortable Respiratory: clear to auscultation bilaterally, no wheezing, no crackles. Normal respiratory effort. No accessory muscle use.  Stable on room air Cardiovascular: Regular rate and rhythm, no murmurs / rubs / gallops.  Plus bilateral lower extremity edema. 2+ pedal pulses.  Normotensive.   Has left upper extremity AVF Abdomen: no tenderness, no masses palpated. Bowel sounds positive. LBM 8/15 Neurologic: CN 2-12 grossly intact. Sensation intact, DTR normal. Strength 5/5 x all 4 extremities.  Psychiatric: Normal judgment and insight. Alert and oriented x 3. Normal mood.    Assessment/Plan: Acute problems: Acute metabolic encephalopathy:  Resolved Initially thought to have seizure from hyperglycemia.   EEG with questionable dysfunction in the left temporal region.  MRI was negative Patient is legally blind.   Hyperglycemia/insulin-dependent diabetes type 2:  Ongoing diabetes greater than 10 years and this is likely the etiology to his renal failure CBGs over the past 24 hours have ranged from 62-233 Continue Semglee 20 units HS with very sensitive SSI Hemoglobin A1c was 12.9 02/08/2021; this is up from 7.2 on 11/25/2020 and is likely representative of acute infectious process   Incidental COVID-positive:  Asymptomatic.  Initial chest x-ray was consistent with volume overload.  Received 1 dose of monoclonal antibody on admission.   Off isolation   Acute otitis media of the right ear:  Complained of fullness, discomfort of right ear.   Previous provider documented otoscopic examination done at the bedside with finding of scant discharge along with erythematous inflammation of the tympanic membrane.   Continue p.o. Augmentin and ciprofloxacin eardrops.   ESRD on dialysis:  Patient started on dialysis January 2022; progressed from CKD4  to CKD 5 at  that time.  Has known horseshoe kidney Documented CKD4 in October 2021 Patient tells me that he had been working up until January when his legs started swelling and he was told he would need to go on dialysis Nephrology following   Acute systolic congestive heart failure:  Echo obtained during this hospitalization showed EF of 45% with global hypokinesis, moderate TR with associated elevated pulmonary artery pressure of 52  mmHg Echocardiogram in 2021 with a EF 55 to 60%, moderate LVH and pulmonary artery pressure of 46 mmHg This is likely related to patient's underlying hypertensive heart disease but given change in EF with diffuse global hypokinesis we will ask cardiology to see during hospitalization Volume management as per dialysis Continue carvedilol and Cozaar Rec I/O and daily weight Cardiology consultation placed   Right toe ulcer:  He was treated with 14 days course of doxycycline ,follows with  podiatry.   Recommend outpatient follow-up.   Right middle toe also has healed very well   Hypertension:  Continue Apresoline, carvedilol, and Cozaar      Data Reviewed: Basic Metabolic Panel: Recent Labs  Lab 02/20/21 0900 02/23/21 0909  NA 128* 127*  K 4.7 5.5*  CL 94* 92*  CO2 19* 22  GLUCOSE 149* 207*  BUN 102* 102*  CREATININE 11.94* 12.42*  CALCIUM 8.1* 8.0*  PHOS 5.7*  --    Liver Function Tests: Recent Labs  Lab 02/20/21 0900  ALBUMIN 2.9*   No results for input(s): LIPASE, AMYLASE in the last 168 hours. No results for input(s): AMMONIA in the last 168 hours. CBC: Recent Labs  Lab 02/20/21 0900  WBC 5.1  HGB 9.1*  HCT 28.2*  MCV 81.7  PLT 264   Cardiac Enzymes: No results for input(s): CKTOTAL, CKMB, CKMBINDEX, TROPONINI in the last 168 hours. BNP (last 3 results) Recent Labs    03/10/20 1410 08/06/20 2012 02/08/21 1905  BNP 617.0* 2,067.3* >4,500.0*    ProBNP (last 3 results) No results for input(s): PROBNP in the last 8760 hours.  CBG: Recent Labs  Lab 02/24/21 0734 02/24/21 1150 02/24/21 1657 02/24/21 2034 02/25/21 0644  GLUCAP 156* 170* 208* 233* 123*    No results found for this or any previous visit (from the past 240 hour(s)).   Studies: No results found.  Scheduled Meds:  (feeding supplement) PROSource Plus  30 mL Oral BID BM   amoxicillin-clavulanate  1 tablet Oral Q24H   atorvastatin  40 mg Oral Daily   carvedilol  25 mg Oral BID  WC   Chlorhexidine Gluconate Cloth  6 each Topical Q0600   ciprofloxacin-fluocinolone PF  0.25 mL Right EAR BID   darbepoetin (ARANESP) injection - DIALYSIS  60 mcg Intravenous Q Mon-HD   doxercalciferol  5 mcg Intravenous Q M,W,F-HD   heparin  5,000 Units Subcutaneous Q12H   imipramine  25 mg Oral QHS   insulin aspart  0-5 Units Subcutaneous QHS   insulin aspart  0-6 Units Subcutaneous TID WC   insulin glargine-yfgn  20 Units Subcutaneous QHS   labetalol  10 mg Intravenous Once   loratadine  10 mg Oral Daily   losartan  50 mg Oral QHS   multivitamin  1 tablet Oral QHS   sevelamer carbonate  1,600 mg Oral TID WC   sodium chloride flush  3 mL Intravenous Q12H   Continuous Infusions:  sodium chloride     promethazine (PHENERGAN) injection (IM or IVPB)      Principal Problem:   Seizure-like activity (HCC)  Active Problems:   Essential hypertension   DM2 (diabetes mellitus, type 2) ([place])   Anemia of chronic kidney failure   Diabetic foot ulcer ([place])   Syncope   DKA (diabetic ketoacidosis) [facility])   Consultants: Nephrology Urology  Procedures: Echocardiogram  Antibiotics: Doxycycline 7/31 through 8/2 Doxycycline 8/5 through 8/13 Augmentin 8/15 >>   Time spent: 35 minutes    Erin Hearing ANP  [facility] 7 am - 330 pm/M-F for direct patient care and secure chat Please refer to Amion for contact info 17  days

## 2021-02-25 NOTE — Progress Notes (Signed)
[facility] Progress Note  Subjective: Seen in room. No complaints. No cp, sob. For dialysis today.   Vitals:   02/24/21 1659 02/24/21 1957 02/24/21 2100 02/25/21 0650  BP: 137/86 (!) 152/101 135/81 (!) 160/106  Pulse: 69 69  70  Resp: '20 18  18  '$ Temp: 98.8 F (37.1 C) 98.7 F (37.1 C)  98.9 F (37.2 C)  TempSrc: Oral Oral  Oral  SpO2: 95% 99%  96%  Weight:      Height:        Exam: General: well appearing, nad Heart: RRR, no m,r,g Lungs: Clear, bilaterally, normal WOB Abdomen: soft non-tender Extremities: No sig LE edema Access: L forearm AVF +bruit       CXR 8/14 - mild vasc congestion    OP HD: SW MWF  4h  450/800  86.5kg  2/2 bath  L AVF  Hep none -[name] 5 mcg IV TIW -Venofer 100 mcg IV X 10 doses 3/7 doses given last dose 02/04/21 -Mircera 225 mcg IV q 2 weeks (last dose 02/02/2021)  Assessment/ Plan Volume overload - pt quite vol overloaded by wts, have done standing wts and they corroborate. Needs strict fluid restriction. Moved to renal floor for better dietary discipline. Max UF as tolerated to get volume down.  ESRD - on HD MWF.   Back on schedule. Next HD 8/17  HHS/DMT2: sp IV insulin on SQ insulin now AMS: resolved, thought to be related to elevated glucose +/- seizure. CT of head without acute changes/MRI neg as well. Neuro involved no anti sz meds req COVID 19- Pt is now off COVID isolation HTN:  Cont home meds. On Coreg 25 bid, Cozaar 50. UF with HD and continue to lower volume as tolerated.  Anemia  - Hgb 9s. Aranesp 60 q week starting 8/15.  Metabolic bone disease -  Ca ok. Increase Renvela to 2 tabs PO TID AC. Continue VDRA.  R toe ulcer  -Completed doxycycline  Nutrition -Renal Carb mod diet, add nepro, renal vits. Disposition -  Unclear. Prev living with aunt but unable to care for him. SW/CM involved, no plan yet.     Lynnda Child PA-C [facility] 02/25/2021,9:34 AM    Recent Labs  Lab  02/20/21 0900 02/23/21 0909  K 4.7 5.5*  BUN 102* 102*  CREATININE 11.94* 12.42*  CALCIUM 8.1* 8.0*  PHOS 5.7*  --   HGB 9.1*  --     Inpatient medications:  (feeding supplement) PROSource Plus  30 mL Oral BID BM   amoxicillin-clavulanate  1 tablet Oral Q24H   atorvastatin  40 mg Oral Daily   carvedilol  25 mg Oral BID WC   Chlorhexidine Gluconate Cloth  6 each Topical Q0600   ciprofloxacin-fluocinolone PF  0.25 mL Right EAR BID   darbepoetin (ARANESP) injection - DIALYSIS  60 mcg Intravenous Q Mon-HD   doxercalciferol  5 mcg Intravenous Q M,W,F-HD   heparin  5,000 Units Subcutaneous Q12H   imipramine  25 mg Oral QHS   insulin aspart  0-5 Units Subcutaneous QHS   insulin aspart  0-6 Units Subcutaneous TID WC   insulin glargine-yfgn  20 Units Subcutaneous QHS   labetalol  10 mg Intravenous Once   loratadine  10 mg Oral Daily   losartan  50 mg Oral QHS   multivitamin  1 tablet Oral QHS   sevelamer carbonate  1,600 mg Oral TID WC   sodium chloride flush  3 mL Intravenous Q12H  sodium chloride     promethazine (PHENERGAN) injection (IM or IVPB)     sodium chloride, acetaminophen, butalbital-acetaminophen-caffeine, dextrose, hydrALAZINE, hydrOXYzine, promethazine (PHENERGAN) injection (IM or IVPB), sodium chloride flush

## 2021-02-25 NOTE — Consult Note (Signed)
Cardiology Consultation:   Patient ID: BONNY EGGER MRN: [id number]; DOB: [birth year]  Admit date: 02/08/2021 Date of Consult: 02/25/2021  PCP:  Gildardo Pounds, NP   [facility] Providers Cardiologist:  None        Patient Profile:   KIRTIS CHALLIS is a 35 y.o. male with a hx of ESRD who is being seen 02/25/2021 for the evaluation of abnormal echocardiogram, mildly reduced ejection fraction, severe LVH compatible with hypertrophic cardiomyopathy at the request of Dr. Tawanna Solo  History of Present Illness:   Mr. Hopes is a 35 year old male with end-stage renal disease on hemodialysis Monday Wednesday Friday with diabetes hypertension hyperlipidemia chronic anemia who underwent echocardiogram that demonstrated severe left ventricular wall thickness of 2.3 cm, thickened RV wall, ejection fraction of 45 to 50% mildly reduced, abnormal EKG with LVH pattern T wave abnormalities.  Currently in hemodialysis.  Has had marked fluid overload.  Denies any chest pain.  Recent COVID infection.  Now off of precautions.  Denies any early family history of sudden cardiac death, no unexplained syncope.   Past Medical History:  Diagnosis Date   Diabetes mellitus    ESRD on hemodialysis ([place])    High cholesterol    Hypertension     Past Surgical History:  Procedure Laterality Date   AV FISTULA PLACEMENT Left 08/11/2020   Procedure: LEFT UPPER EXTREMITY ARTERIOVENOUS (AV) FISTULA CREATION;  Surgeon: Cherre Robins, MD;  Location: [place];  Service: Vascular;  Laterality: Left;   FRACTURE SURGERY     I & D EXTREMITY Left 07/16/2014   Procedure: IRRIGATION AND DEBRIDEMENT EXTREMITY/LEFT INDEX FINGER;  Surgeon: Leanora Cover, MD;  Location: [facility];  Service: Orthopedics;  Laterality: Left;   IR PERC TUN PERIT CATH WO PORT S&I /IMAG  08/07/2020   IR US GUIDE VASC ACCESS RIGHT  08/07/2020     Home Medications:  Prior to Admission medications   Medication Sig Start Date End Date Taking? Authorizing  Provider  atorvastatin (LIPITOR) 40 MG tablet TAKE 1 TABLET (40 MG TOTAL) BY MOUTH DAILY. Patient taking differently: Take 40 mg by mouth daily. 09/06/20 09/06/21  Gildardo Pounds, NP  Blood Glucose Monitoring Suppl (TRUE METRIX METER) w/Device KIT Use as instructed. Check blood glucose level by fingerstick once per day. 09/06/20   Gildardo Pounds, NP  carvedilol (COREG) 6.25 MG tablet Take 6.25 mg by mouth 2 (two) times daily with a meal.    [provider]  doxycycline (VIBRA-TABS) 100 MG tablet Take 1 tablet by mouth twice daily for 7 days 02/05/21     hydrALAZINE (APRESOLINE) 25 MG tablet Take 1 tablet (25 mg total) by mouth 3 (three) times daily. 11/27/20   Lavina Hamman, MD  hydrOXYzine (ATARAX/VISTARIL) 25 MG tablet Take 1 tablet (25 mg total) by mouth 3 (three) times daily as needed for itching. 11/27/20   Lavina Hamman, MD  imipramine (TOFRANIL) 25 MG tablet TAKE 1 TABLET (25 MG TOTAL) BY MOUTH AT BEDTIME. FOR DEPRESSION Patient taking differently: Take 25 mg by mouth at bedtime. 09/06/20 09/06/21  Gildardo Pounds, NP  insulin detemir (LEVEMIR) 100 UNIT/ML injection Inject 0.15 mLs (15 Units total) into the skin daily. Patient taking differently: Inject 10 Units into the skin daily. 05/10/20   Amin, Ankit Chirag, MD  losartan (COZAAR) 50 MG tablet TAKE 1 TABLET (50 MG TOTAL) BY MOUTH DAILY. Patient taking differently: Take 50 mg by mouth daily. 08/13/20 08/13/21  Antonieta Pert, MD  multivitamin (RENA-VIT)  TABS tablet Take 1 tablet by mouth at bedtime. 11/27/20   Lavina Hamman, MD  Nutritional Supplements (FEEDING SUPPLEMENT, [name] CARB STEADY,) LIQD Take 237 mLs by mouth 3 (three) times daily between meals. 11/27/20   Lavina Hamman, MD  sevelamer carbonate (RENVELA) 800 MG tablet TAKE 1 TABLET (800 MG TOTAL) BY MOUTH THREE TIMES DAILY WITH MEALS. Patient not taking: No sig reported 08/13/20 08/13/21  Antonieta Pert, MD  torsemide (DEMADEX) 20 MG tablet TAKE 2 TABLETS (40 MG) DAILY ON NON  HEMODIALYSIS DAYS Patient taking differently: Take 40 mg by mouth See admin instructions. TAKE 2 TABLETS (40 MG) DAILY ON NON HEMODIALYSIS DAYS 08/13/20 08/13/21  Antonieta Pert, MD  TRUEplus Lancets 28G MISC Use as instructed. Check blood glucose level by fingerstick once per day. 09/06/20   Gildardo Pounds, NP  TRUEplus Lancets 28G MISC USE AS INSTRUCTED. CHECK BLOOD GLUCOSE LEVEL BY FINGERSTICK ONCE PER DAY. 09/06/20 09/06/21  Gildardo Pounds, NP    Inpatient Medications: Scheduled Meds:  (feeding supplement) PROSource Plus  30 mL Oral BID BM   amoxicillin-clavulanate  1 tablet Oral Q24H   atorvastatin  40 mg Oral Daily   carvedilol  25 mg Oral BID WC   Chlorhexidine Gluconate Cloth  6 each Topical Q0600   ciprofloxacin-fluocinolone PF  0.25 mL Right EAR BID   darbepoetin (ARANESP) injection - DIALYSIS  60 mcg Intravenous Q Mon-HD   doxercalciferol       doxercalciferol  5 mcg Intravenous Q M,W,F-HD   heparin  5,000 Units Subcutaneous Q12H   imipramine  25 mg Oral QHS   insulin aspart  0-5 Units Subcutaneous QHS   insulin aspart  0-6 Units Subcutaneous TID WC   insulin glargine-yfgn  20 Units Subcutaneous QHS   labetalol  10 mg Intravenous Once   loratadine  10 mg Oral Daily   losartan  50 mg Oral QHS   multivitamin  1 tablet Oral QHS   sevelamer carbonate  1,600 mg Oral TID WC   sodium chloride flush  3 mL Intravenous Q12H   Continuous Infusions:  sodium chloride     promethazine (PHENERGAN) injection (IM or IVPB)     PRN Meds: sodium chloride, acetaminophen, butalbital-acetaminophen-caffeine, dextrose, hydrALAZINE, hydrOXYzine, promethazine (PHENERGAN) injection (IM or IVPB), sodium chloride flush  Allergies:    Allergies  Allergen Reactions   Amlodipine Nausea And Vomiting and Other (See Comments)    Patient was taking Amlodipine and Hydralazine at the same time, so the reactions came from one of the 2: Lethargy and an all-over feeling of NOT feeling well (also)   Hydralazine  Nausea And Vomiting and Other (See Comments)    Patient was taking Hydralazine AND Amlodipine at the same time, so the reactions came from one of the 2: Lethargy and an all-over feeling of NOT feeling well (also)    Social History:   Social History   Socioeconomic History   Marital status: Single    Spouse name: Not on file   Number of children: Not on file   Years of education: Not on file   Highest education level: Not on file  Occupational History   Not on file  Tobacco Use   Smoking status: Never   Smokeless tobacco: Never  Vaping Use   Vaping Use: Never used  Substance and Sexual Activity   Alcohol use: Not Currently   Drug use: No   Sexual activity: Yes  Other Topics Concern   Not on file  Social  History Narrative   Not on file   Social Determinants of Health   Financial Resource Strain: Not on file  Food Insecurity: Not on file  Transportation Needs: Not on file  Physical Activity: Not on file  Stress: Not on file  Social Connections: Not on file  Intimate Partner Violence: Not on file    Family History:    Family History  Problem Relation Age of Onset   Cancer Mother    Stroke Father    Diabetes Father    Hypertension Father      ROS:  Please see the history of present illness.   All other ROS reviewed and negative.     Physical Exam/Data:   Vitals:   02/25/21 1530 02/25/21 1600 02/25/21 1630 02/25/21 1657  BP: (!) 140/95 127/80 (!) 160/80 (!) 179/96  Pulse: 73 76 75 76  Resp: $Remo'14 17 17 'JwJSQ$ (!) 23  Temp:      TempSrc:      SpO2:      Weight:      Height:        Intake/Output Summary (Last 24 hours) at 02/25/2021 1722 Last data filed at 02/25/2021 1125 Gross per 24 hour  Intake 920 ml  Output --  Net 920 ml   Last 3 Weights 02/25/2021 02/23/2021 02/23/2021  Weight (lbs) 222 lb 3.6 oz 221 lb 9 oz 221 lb 9 oz  Weight (kg) 100.8 kg 100.5 kg 100.5 kg     Body mass index is 30.14 kg/m.  General:  Well nourished, well developed, in no acute  distress HEENT: normal Lymph: no adenopathy Neck: no JVD Endocrine:  No thryomegaly Vascular: No carotid bruits; FA pulses 2+ bilaterally without bruits  Cardiac:  normal S1, S2; RRR; no murmur  Lungs:  clear to auscultation bilaterally, no wheezing, rhonchi or rales  Abd: soft, nontender, no hepatomegaly  Ext: no edema Musculoskeletal:  No deformities, BUE and BLE strength normal and equal Skin: warm and dry  Neuro:  CNs 2-12 intact, no focal abnormalities noted Psych:  Normal affect   EKG:  The EKG was personally reviewed and demonstrates: Sinus rhythm, 93 bpm, LVH with repolarization T wave abnormalities.  Telemetry:  Telemetry was personally reviewed and demonstrates: Sinus rhythm  Relevant CV Studies:  ECHO 02/09/21:   1. Left ventricular ejection fraction, by estimation, is 45 to 50%. The  left ventricle has mildly decreased function. The left ventricle  demonstrates global hypokinesis. There is severe left ventricular  hypertrophy. Left ventricular diastolic parameters   are indeterminate.   2. Right ventricular systolic function is normal. The right ventricular  size is mildly enlarged. Moderately increased right ventricular wall  thickness. There is moderately elevated pulmonary artery systolic  pressure. The estimated right ventricular  systolic pressure is 29.0 mmHg.   3. The mitral valve is normal in structure. Trivial mitral valve  regurgitation.   4. Tricuspid valve regurgitation is moderate.   5. The aortic valve is tricuspid. Aortic valve regurgitation is not  visualized.   6. The inferior vena cava is dilated in size with <50% respiratory  variability, suggesting right atrial pressure of 15 mmHg.   Prior ECHO 2021 - 55-60% EF  Laboratory Data:  High Sensitivity Troponin:   Recent Labs  Lab 02/08/21 1905  TROPONINIHS 14     Chemistry Recent Labs  Lab 02/20/21 0900 02/23/21 0909  NA 128* 127*  K 4.7 5.5*  CL 94* 92*  CO2 19* 22  GLUCOSE 149*  207*  BUN 102* 102*  CREATININE 11.94* 12.42*  CALCIUM 8.1* 8.0*  GFRNONAA 5* 5*  ANIONGAP 15 13    Recent Labs  Lab 02/20/21 0900  ALBUMIN 2.9*   Hematology Recent Labs  Lab 02/20/21 0900  WBC 5.1  RBC 3.45*  HGB 9.1*  HCT 28.2*  MCV 81.7  MCH 26.4  MCHC 32.3  RDW 18.0*  PLT 264   BNPNo results for input(s): BNP, PROBNP in the last 168 hours.  DDimer No results for input(s): DDIMER in the last 168 hours.   Radiology/Studies:  DG Chest 2 View  Result Date: 02/22/2021 CLINICAL DATA:  Volume overload.  End-stage renal disease. EXAM: CHEST - 2 VIEW COMPARISON:  February 08, 2021 FINDINGS: Stable cardiomegaly. The hila and mediastinum are unchanged. No pneumothorax. No nodules or masses. No focal infiltrates. No overt edema. IMPRESSION: No overt edema.  Suggested mild pulmonary venous congestion. Electronically Signed   By: Dorise Bullion III M.D.   On: 02/22/2021 09:42     Assessment and Plan:   35 year old with end-stage renal disease on hemodialysis Monday Wednesday Friday with diabetes type 2, recent COVID infection, with volume overload who recently had echocardiogram that demonstrated mildly reduced ejection fraction of 45 to 50%, severe left ventricular hypertrophy consistent with hypertrophic cardiomyopathy.  Mildly reduced ejection fraction of 45 to 50% with severe left ventricular hypertrophy consistent with hypertrophic cardiomyopathy - Continue with volume modification with max ultrafiltration as tolerated. - Continue with carvedilol 25 mg twice a day - Not a candidate for Entresto given end-stage renal disease.  He has however currently on losartan 50 mg, angiotensin receptor blocker. -It is unusual to see left ventricular wall thickness to this degree with hypertension alone. -Personally reviewed his echocardiogram, the wall texture thickness as well as RV thickness has a pattern compatible with amyloidosis however his age seems a bit odd for this.  We could always  check a PYP scan in the future.   -Blood pressure does fluctuate quite a bit, maximum seen earlier this morning was 160/106, later last night was 135/81. - Currently no evidence of ventricular arrhythmias.  No evidence of obstructive physiology on echocardiogram. -As outpatient would place a Zio patch monitor for 2 weeks looking for any adverse arrhythmias.  We can help facilitate.  Acute metabolic encephalopathy - He was initially thought to have seizures, EEG was questionable in the temporal region, MRI is negative.  Encephalopathy resolved.  He is legally blind.  Overall okay to discharge from cardiology perspective when appropriate.  For questions or updates, please contact [place] Please consult [email or web address] for contact info under    Signed, Candee Furbish, MD  02/25/2021 5:22 PM

## 2021-02-25 NOTE — Plan of Care (Signed)
°  Problem: Education: °Goal: Knowledge of General Education information will improve °Description: Including pain rating scale, medication(s)/side effects and non-pharmacologic comfort measures °Outcome: Progressing °  °Problem: Clinical Measurements: °Goal: Ability to maintain clinical measurements within normal limits will improve °Outcome: Progressing °  °Problem: Nutrition: °Goal: Adequate nutrition will be maintained °Outcome: Progressing °  °

## 2021-02-25 NOTE — Progress Notes (Signed)
CSW received return call from patient's [name] who states the patient does not have any income. Angela Nevin states patient's case manager from [employer] submitted a disability application on his behalf two months ago. Angela Nevin agreeable for CSW to contact case manager to obtain additional information.  CSW attempted to reach Harlow Ohms at [place] without success - a voicemail was left requesting a return call.  Madilyn Fireman, MSW, LCSW Transitions of Care  Clinical Social Worker II [phone]

## 2021-02-25 NOTE — Progress Notes (Signed)
Pt off unit to hemodialysis. 

## 2021-02-26 ENCOUNTER — Encounter (HOSPITAL_COMMUNITY): Payer: Self-pay | Admitting: Internal Medicine

## 2021-02-26 DIAGNOSIS — R569 Unspecified convulsions: Secondary | ICD-10-CM | POA: Diagnosis not present

## 2021-02-26 DIAGNOSIS — I422 Other hypertrophic cardiomyopathy: Secondary | ICD-10-CM

## 2021-02-26 HISTORY — DX: Other hypertrophic cardiomyopathy: I42.2

## 2021-02-26 LAB — GLUCOSE, CAPILLARY
Glucose-Capillary: 127 mg/dL — ABNORMAL HIGH (ref 70–99)
Glucose-Capillary: 202 mg/dL — ABNORMAL HIGH (ref 70–99)
Glucose-Capillary: 145 mg/dL — ABNORMAL HIGH (ref 70–99)
Glucose-Capillary: 192 mg/dL — ABNORMAL HIGH (ref 70–99)

## 2021-02-26 NOTE — Progress Notes (Signed)
CSW spoke with [place] of [employer] who states she did apply for disability on the patient's behalf but does not have any updated information regarding then application. Hebert Soho states she will contact the patient's aunt Angela Nevin for further discussion and return call to [place].  Madilyn Fireman, MSW, LCSW Transitions of Care  Clinical Social Worker II [phone]

## 2021-02-26 NOTE — Progress Notes (Signed)
[facility] Progress Note  Subjective: Seen in room. Dialysis yesterday -tolerated 5L UF. Has no complaints this am. Dispo pending.   Vitals:   02/25/21 1828 02/25/21 2059 02/26/21 0542 02/26/21 0604  BP: (!) 178/95 (!) 144/84 (!) 166/107   Pulse: 80 83 75   Resp: '18 16 18   '$ Temp: 98.7 F (37.1 C) 98.8 F (37.1 C) 98.4 F (36.9 C)   TempSrc: Oral Oral Oral   SpO2: 97% 97% 96%   Weight:    96.7 kg  Height:        Exam: General: well appearing, nad Heart: RRR, no m,r,g Lungs: Clear, bilaterally, normal WOB Abdomen: soft non-tender Extremities: No sig LE edema Access: L forearm AVF +bruit       CXR 8/14 - mild vasc congestion    OP HD: SW MWF  4h  450/800  86.5kg  2/2 bath  L AVF  Hep none -[name] 5 mcg IV TIW -Venofer 100 mcg IV X 10 doses 3/7 doses given last dose 02/04/21 -Mircera 225 mcg IV q 2 weeks (last dose 02/02/2021)  Assessment/ Plan Volume overload - pt quite vol overloaded by wts, have done standing wts and they corroborate. Needs strict fluid restriction. Moved to renal floor for better dietary discipline. Max UF as tolerated to get volume down.  ESRD - on HD MWF.  Next HD 8/19  HHS/DMT2: sp IV insulin on SQ insulin now AMS: resolved, thought to be related to elevated glucose +/- seizure. CT of head without acute changes/MRI neg as well. Neuro involved no anti sz meds req COVID 19- Pt is now off COVID isolation HTN:  Cont home meds. On Coreg 25 bid, Cozaar 50. UF with HD and continue to lower volume as tolerated.  Anemia  - Hgb 9s. Aranesp 60 q week starting 8/15.  Metabolic bone disease -  Ca ok. Increase Renvela to 2 tabs PO TID AC. Continue VDRA.  R toe ulcer  -Completed doxycycline  Nutrition -Renal Carb mod diet, add nepro, renal vits. Disposition -  Unclear. Prev living with aunt but unable to care for him. SW/CM involved, no plan yet.     Lynnda Child PA-C [facility] 02/26/2021,12:30 PM    Recent Labs   Lab 02/20/21 0900 02/23/21 0909  K 4.7 5.5*  BUN 102* 102*  CREATININE 11.94* 12.42*  CALCIUM 8.1* 8.0*  PHOS 5.7*  --   HGB 9.1*  --     Inpatient medications:  (feeding supplement) PROSource Plus  30 mL Oral BID BM   amoxicillin-clavulanate  1 tablet Oral Q24H   atorvastatin  40 mg Oral Daily   carvedilol  25 mg Oral BID WC   Chlorhexidine Gluconate Cloth  6 each Topical Q0600   ciprofloxacin-fluocinolone PF  0.25 mL Right EAR BID   darbepoetin (ARANESP) injection - DIALYSIS  60 mcg Intravenous Q Mon-HD   doxercalciferol  5 mcg Intravenous Q M,W,F-HD   heparin  5,000 Units Subcutaneous Q12H   imipramine  25 mg Oral QHS   insulin aspart  0-5 Units Subcutaneous QHS   insulin aspart  0-6 Units Subcutaneous TID WC   insulin glargine-yfgn  20 Units Subcutaneous QHS   labetalol  10 mg Intravenous Once   loratadine  10 mg Oral Daily   losartan  50 mg Oral QHS   multivitamin  1 tablet Oral QHS   sevelamer carbonate  1,600 mg Oral TID WC   sodium chloride flush  3 mL Intravenous Q12H  sodium chloride     promethazine (PHENERGAN) injection (IM or IVPB)     sodium chloride, acetaminophen, butalbital-acetaminophen-caffeine, dextrose, hydrALAZINE, hydrOXYzine, promethazine (PHENERGAN) injection (IM or IVPB), sodium chloride flush

## 2021-02-26 NOTE — Progress Notes (Signed)
[facility] PROGRESS NOTE  OLSON FACKRELL [id number] DOB: 09/21/1985 DOA: 02/08/2021 PCP: Gildardo Pounds, NP  Status:  Remains inpatient appropriate because:Unsafe d/c plan  Dispo: The patient is from: Home              Anticipated d/c is to:  Unknown- pt disability pending and o/w has no income-may need to look for group home              Patient currently is medically stable to d/c.   Difficult to place patient Yes    Level of care: Med-Surg  Code Status: Full Family Communication: Patient only DVT prophylaxis: Subcutaneous heparin COVID vaccination: Unknown  HPI: 35 year old male with history of ESRD on dialysis,IDDM on insulin, HTN, HLD, legally blind, presented with syncope versus seizure.  Blood glucose were severely elevated on presentation.  He was thought to have seizures from elevated blood glucose levels.  Found to have incidental positive for COVID, no respiratory issues.  He used to live with his aunt but she is unable to take him back home.    Subjective: Awake and alert.  Has just completed eating breakfast and is sitting on side of bed no physical complaints.  I discussed with him the visit from the cardiologist and his new diagnosis of hypertrophic cardiomyopathy and expected outpatient testing once he is discharged.  Objective: Vitals:   02/25/21 2059 02/26/21 0542  BP: (!) 144/84 (!) 166/107  Pulse: 83 75  Resp: 16 18  Temp: 98.8 F (37.1 C) 98.4 F (36.9 C)  SpO2: 97% 96%    Intake/Output Summary (Last 24 hours) at 02/26/2021 0812 Last data filed at 02/26/2021 0600 Gross per 24 hour  Intake 600 ml  Output 5001 ml  Net -4401 ml   Filed Weights   02/25/21 1355 02/25/21 1740 02/26/21 0604  Weight: 100.8 kg 95.8 kg 96.7 kg    Exam:  Constitutional: NAD, appears to be comfortable Respiratory: Posterior lung sounds remain clear.  Stable on room air. Cardiovascular: S1-S2, no peripheral edema, tense.  Has left upper extremity  AVF Abdomen: no tenderness, not distended.  Bowel sounds positive. LBM 8/16 Neurologic: CN 2-12 grossly intact. Sensation intact, DTR normal. Strength 5/5 x all 4 extremities.  Psychiatric: Normal judgment and insight. Alert and oriented x 3. Normal mood.    Assessment/Plan: Acute problems: Acute metabolic encephalopathy:  Resolved Initially thought to have seizure activity from hyperglycemia.   EEG with questionable dysfunction in the left temporal region.  MRI was negative Patient is legally blind.   Hyperglycemia/insulin-dependent diabetes type 2:  Ongoing diabetes greater than 10 years and this is likely the etiology to his renal failure CBGs over the past 24 hours have ranged from 62-233 Continue Semglee 20 units HS with very sensitive SSI Hemoglobin A1c was 12.9 02/08/2021; this is up from 7.2 on 11/25/2020 and is likely representative of acute infectious process   Incidental COVID-positive:  Asymptomatic.  Initial chest x-ray was consistent with volume overload.  Received 1 dose of monoclonal antibody on admission.   Off isolation   Acute otitis media of the right ear:  Complained of fullness, discomfort of right ear.   Previous provider documented otoscopic examination done at the bedside with finding of scant discharge along with erythematous inflammation of the tympanic membrane.   Continue p.o. Augmentin and ciprofloxacin eardrops.   ESRD on dialysis:  Patient started on dialysis January 2022; progressed from CKD4  to CKD 5 at that time.  Has known horseshoe  kidney Documented CKD4 in October 2021 Patient tells me that he had been working up until January when his legs started swelling and he was told he would need to go on dialysis Nephrology following   Acute systolic congestive heart failure/new dx Hypertrophic CM:  Echo obtained during this hospitalization showed EF of 45% with global hypokinesis, moderate TR with associated elevated pulmonary artery pressure of 52  mmHg Echocardiogram in 2021 with a EF 55 to 60%, moderate LVH and pulmonary artery pressure of 46 mmHg This is likely related to patient's underlying hypertensive heart disease but given change in EF with diffuse global hypokinesis we will ask cardiology to see during hospitalization Volume management as per dialysis Continue carvedilol and Cozaar Rec I/O and daily weight Cardiology consultation/Dr Skains rec:  - Continue with volume modification with max ultrafiltration as tolerated. - Continue with carvedilol 25 mg twice a day - Not a candidate for Entresto given end-stage renal disease.  He has however currently on losartan 50 mg, angiotensin receptor blocker. -It is unusual to see left ventricular wall thickness to this degree with hypertension alone. -Personally reviewed his echocardiogram, the wall texture thickness as well as RV thickness has a pattern compatible with amyloidosis however his age seems a bit odd for this.  We could always check a PYP scan in the future.   -Blood pressure does fluctuate quite a bit, maximum seen earlier this morning was 160/106, later last night was 135/81. - Currently no evidence of ventricular arrhythmias.  No evidence of obstructive physiology on echocardiogram. -As outpatient would place a Zio patch monitor for 2 weeks looking for any adverse arrhythmias.  We can help facilitate.  Right toe ulcer:  He was treated with 14 days course of doxycycline ,follows with  podiatry.   Recommend outpatient follow-up.   Right middle toe also has healed very well   Hypertension:  Continue Apresoline, carvedilol, and Cozaar      Data Reviewed: Basic Metabolic Panel: Recent Labs  Lab 02/20/21 0900 02/23/21 0909  NA 128* 127*  K 4.7 5.5*  CL 94* 92*  CO2 19* 22  GLUCOSE 149* 207*  BUN 102* 102*  CREATININE 11.94* 12.42*  CALCIUM 8.1* 8.0*  PHOS 5.7*  --    Liver Function Tests: Recent Labs  Lab 02/20/21 0900  ALBUMIN 2.9*   No results for  input(s): LIPASE, AMYLASE in the last 168 hours. No results for input(s): AMMONIA in the last 168 hours. CBC: Recent Labs  Lab 02/20/21 0900  WBC 5.1  HGB 9.1*  HCT 28.2*  MCV 81.7  PLT 264   Cardiac Enzymes: No results for input(s): CKTOTAL, CKMB, CKMBINDEX, TROPONINI in the last 168 hours. BNP (last 3 results) Recent Labs    03/10/20 1410 08/06/20 2012 02/08/21 1905  BNP 617.0* 2,067.3* >4,500.0*    ProBNP (last 3 results) No results for input(s): PROBNP in the last 8760 hours.  CBG: Recent Labs  Lab 02/24/21 2034 02/25/21 0644 02/25/21 1148 02/25/21 1826 02/25/21 2100  GLUCAP 233* 123* 142* 206* 262*    No results found for this or any previous visit (from the past 240 hour(s)).   Studies: No results found.  Scheduled Meds:  (feeding supplement) PROSource Plus  30 mL Oral BID BM   amoxicillin-clavulanate  1 tablet Oral Q24H   atorvastatin  40 mg Oral Daily   carvedilol  25 mg Oral BID WC   Chlorhexidine Gluconate Cloth  6 each Topical Q0600   ciprofloxacin-fluocinolone PF  0.25 mL  Right EAR BID   darbepoetin (ARANESP) injection - DIALYSIS  60 mcg Intravenous Q Mon-HD   doxercalciferol  5 mcg Intravenous Q M,W,F-HD   heparin  5,000 Units Subcutaneous Q12H   imipramine  25 mg Oral QHS   insulin aspart  0-5 Units Subcutaneous QHS   insulin aspart  0-6 Units Subcutaneous TID WC   insulin glargine-yfgn  20 Units Subcutaneous QHS   labetalol  10 mg Intravenous Once   loratadine  10 mg Oral Daily   losartan  50 mg Oral QHS   multivitamin  1 tablet Oral QHS   sevelamer carbonate  1,600 mg Oral TID WC   sodium chloride flush  3 mL Intravenous Q12H   Continuous Infusions:  sodium chloride     promethazine (PHENERGAN) injection (IM or IVPB)      Principal Problem:   Seizure-like activity (HCC) Active Problems:   Essential hypertension   DM2 (diabetes mellitus, type 2) ([place])   Anemia of chronic kidney failure   Diabetic foot ulcer ([place])   Syncope    DKA (diabetic ketoacidosis) [facility])   Consultants: Nephrology Urology  Procedures: Echocardiogram  Antibiotics: Doxycycline 7/31 through 8/2 Doxycycline 8/5 through 8/13 Augmentin 8/15 >>   Time spent: 35 minutes    Erin Hearing ANP  [facility] 7 am - 330 pm/M-F for direct patient care and secure chat Please refer to Amion for contact info 18  days

## 2021-02-26 NOTE — Progress Notes (Signed)
Progress Note  Patient Name: [name] Date of Encounter: 02/26/2021  [facility] Cardiologist: None Cienna Dumais  Subjective   Sitting up eating breakfast, no CP  Inpatient Medications    Scheduled Meds:  (feeding supplement) PROSource Plus  30 mL Oral BID BM   amoxicillin-clavulanate  1 tablet Oral Q24H   atorvastatin  40 mg Oral Daily   carvedilol  25 mg Oral BID WC   Chlorhexidine Gluconate Cloth  6 each Topical Q0600   ciprofloxacin-fluocinolone PF  0.25 mL Right EAR BID   darbepoetin (ARANESP) injection - DIALYSIS  60 mcg Intravenous Q Mon-HD   doxercalciferol  5 mcg Intravenous Q M,W,F-HD   heparin  5,000 Units Subcutaneous Q12H   imipramine  25 mg Oral QHS   insulin aspart  0-5 Units Subcutaneous QHS   insulin aspart  0-6 Units Subcutaneous TID WC   insulin glargine-yfgn  20 Units Subcutaneous QHS   labetalol  10 mg Intravenous Once   loratadine  10 mg Oral Daily   losartan  50 mg Oral QHS   multivitamin  1 tablet Oral QHS   sevelamer carbonate  1,600 mg Oral TID WC   sodium chloride flush  3 mL Intravenous Q12H   Continuous Infusions:  sodium chloride     promethazine (PHENERGAN) injection (IM or IVPB)     PRN Meds: sodium chloride, acetaminophen, butalbital-acetaminophen-caffeine, dextrose, hydrALAZINE, hydrOXYzine, promethazine (PHENERGAN) injection (IM or IVPB), sodium chloride flush   Vital Signs    Vitals:   02/25/21 1828 02/25/21 2059 02/26/21 0542 02/26/21 0604  BP: (!) 178/95 (!) 144/84 (!) 166/107   Pulse: 80 83 75   Resp: '18 16 18   '$ Temp: 98.7 F (37.1 C) 98.8 F (37.1 C) 98.4 F (36.9 C)   TempSrc: Oral Oral Oral   SpO2: 97% 97% 96%   Weight:    96.7 kg  Height:        Intake/Output Summary (Last 24 hours) at 02/26/2021 0904 Last data filed at 02/26/2021 0600 Gross per 24 hour  Intake 480 ml  Output 5001 ml  Net -4521 ml   Last 3 Weights 02/26/2021 02/25/2021 02/25/2021  Weight (lbs) 213 lb 3 oz 211 lb 3.2 oz 222 lb 3.6 oz   Weight (kg) 96.7 kg 95.8 kg 100.8 kg      Telemetry    none - Personally Reviewed  ECG    No new - Personally Reviewed  Physical Exam  GEN: No acute distress.   Neck: No JVD Cardiac: RRR, no murmurs, rubs, or gallops.  Respiratory: Clear to auscultation bilaterally. GI: Soft, nontender, non-distended  MS: No edema; No deformity. Neuro:  Nonfocal  Psych: Normal affect   Labs    High Sensitivity Troponin:   Recent Labs  Lab 02/08/21 1905  TROPONINIHS 14      Chemistry Recent Labs  Lab 02/20/21 0900 02/23/21 0909  NA 128* 127*  K 4.7 5.5*  CL 94* 92*  CO2 19* 22  GLUCOSE 149* 207*  BUN 102* 102*  CREATININE 11.94* 12.42*  CALCIUM 8.1* 8.0*  ALBUMIN 2.9*  --   GFRNONAA 5* 5*  ANIONGAP 15 13     Hematology Recent Labs  Lab 02/20/21 0900  WBC 5.1  RBC 3.45*  HGB 9.1*  HCT 28.2*  MCV 81.7  MCH 26.4  MCHC 32.3  RDW 18.0*  PLT 264    BNPNo results for input(s): BNP, PROBNP in the last 168 hours.   DDimer No results for input(s):  DDIMER in the last 168 hours.   Radiology    No results found.  Cardiac Studies   Severe LVH  Patient Profile     35 y.o. male with hypertrophic cardiomyopathy end-stage renal disease diabetes recent COVID anemia right toe ulcer  Assessment & Plan    Hypertrophic cardiomyopathy - Continue with carvedilol 25 mg twice a day -As outpatient, will place ZIO patch monitor.   We will set up follow up.  Signing off. Please let us know if further assistance is needed.       For questions or updates, please contact [place] Please consult [email or web address] for contact info under        Signed, Candee Furbish, MD  02/26/2021, 9:04 AM

## 2021-02-27 DIAGNOSIS — R569 Unspecified convulsions: Secondary | ICD-10-CM | POA: Diagnosis not present

## 2021-02-27 LAB — CBC
HCT: 29.5 % — ABNORMAL LOW (ref 39.0–52.0)
Hemoglobin: 9.2 g/dL — ABNORMAL LOW (ref 13.0–17.0)
MCH: 25.8 pg — ABNORMAL LOW (ref 26.0–34.0)
MCHC: 31.2 g/dL (ref 30.0–36.0)
MCV: 82.9 fL (ref 80.0–100.0)
Platelets: 234 10*3/uL (ref 150–400)
RBC: 3.56 MIL/uL — ABNORMAL LOW (ref 4.22–5.81)
RDW: 17.8 % — ABNORMAL HIGH (ref 11.5–15.5)
WBC: 5.1 10*3/uL (ref 4.0–10.5)
nRBC: 0 % (ref 0.0–0.2)

## 2021-02-27 LAB — RENAL FUNCTION PANEL
Albumin: 3.1 g/dL — ABNORMAL LOW (ref 3.5–5.0)
Anion gap: 13 (ref 5–15)
BUN: 71 mg/dL — ABNORMAL HIGH (ref 6–20)
CO2: 23 mmol/L (ref 22–32)
Calcium: 8.7 mg/dL — ABNORMAL LOW (ref 8.9–10.3)
Chloride: 94 mmol/L — ABNORMAL LOW (ref 98–111)
Creatinine, Ser: 9.97 mg/dL — ABNORMAL HIGH (ref 0.61–1.24)
GFR, Estimated: 6 mL/min — ABNORMAL LOW (ref >60)
Glucose, Bld: 178 mg/dL — ABNORMAL HIGH (ref 70–99)
Phosphorus: 5.3 mg/dL — ABNORMAL HIGH (ref 2.5–4.6)
Potassium: 4.9 mmol/L (ref 3.5–5.1)
Sodium: 130 mmol/L — ABNORMAL LOW (ref 135–145)

## 2021-02-27 LAB — GLUCOSE, CAPILLARY
Glucose-Capillary: 141 mg/dL — ABNORMAL HIGH (ref 70–99)
Glucose-Capillary: 164 mg/dL — ABNORMAL HIGH (ref 70–99)
Glucose-Capillary: 208 mg/dL — ABNORMAL HIGH (ref 70–99)

## 2021-02-27 NOTE — Procedures (Signed)
Patient seen and examined on Hemodialysis. BP (!) 175/93 (BP Location: Right Arm)   Pulse 83   Temp 98.5 F (36.9 C) (Oral)   Resp 18   Ht 6' (1.829 m)   Wt 99.7 kg   SpO2 96%   BMI 29.81 kg/m   QB 400 mL/ min via AVF, UF goal 4L  Tolerating treatment without complaints at this time.   Madelon Lips MD [facility] [phone] 11:27 AM

## 2021-02-27 NOTE — Progress Notes (Signed)
CSW attempted to reach [facility], case manager at [facility] without success - a voicemail was left requesting a return call.  Madilyn Fireman, MSW, LCSW Transitions of Care  Clinical Social Worker II [phone]

## 2021-02-27 NOTE — Progress Notes (Signed)
[facility] PROGRESS NOTE  DELWOOD PHILIPPS [id number] DOB: [birth year] DOA: 02/08/2021 PCP: Gildardo Pounds, NP  Status:  Remains inpatient appropriate because:Unsafe d/c plan  Dispo: The patient is from: Home              Anticipated d/c is to:  Unknown- pt disability pending and o/w has no income-may need to look for group home              Patient currently is medically stable to d/c.   Difficult to place patient Yes    Level of care: Med-Surg  Code Status: Full Family Communication: Patient only DVT prophylaxis: Subcutaneous heparin COVID vaccination: Unknown  HPI: 35 year old male with history of ESRD on dialysis,IDDM on insulin, HTN, HLD, legally blind, presented with syncope versus seizure.  Blood glucose were severely elevated on presentation.  He was thought to have seizures from elevated blood glucose levels.  Found to have incidental positive for COVID, no respiratory issues.  He used to live with his aunt but she is unable to take him back home.    Subjective: Examined during dialysis.  Complaining of abdominal distention without pain, nausea vomiting.  He also reported he had a BM this morning.  We discussed this was likely related to excess volume he agrees.  I told him if things change we would definitely need to get an abdominal x-ray.  Objective: Vitals:   02/26/21 2039 02/27/21 0558  BP: (!) 159/94 (!) 161/99  Pulse: 74 73  Resp: 18 18  Temp: 98 F (36.7 C) 98.7 F (37.1 C)  SpO2: 97% 95%   No intake or output data in the 24 hours ending 02/27/21 0747  Filed Weights   02/25/21 1355 02/25/21 1740 02/26/21 0604  Weight: 100.8 kg 95.8 kg 96.7 kg    Exam:  Constitutional: NAD, mildly uncomfortable secondary to abdominal anasarca Respiratory: [place] lung sounds clear to auscultation.  Stable on room air during dialysis Cardiovascular: Normal heart sounds, normotensive, trace to no peripheral edema.  Has left upper extremity AVF Abdomen: no  tenderness, slightly distended but not tympanitic and nontender bowel sounds positive. LBM 8/16 Neurologic: CN 2-12 grossly intact. Sensation intact, DStrength 5/5 x all 4 extremities.  Psychiatric: Normal judgment and insight. Alert and oriented x 3. Normal mood.    Assessment/Plan: Acute problems: Acute metabolic encephalopathy:  Resolved Initially thought to have seizure activity from hyperglycemia.   EEG with questionable dysfunction in the left temporal region.  MRI was negative Patient is legally blind.   Hyperglycemia/insulin-dependent diabetes type 2:  Ongoing diabetes greater than 10 years and this is likely the etiology to his renal failure Continue Semglee 20 units HS with very sensitive SSI Hemoglobin A1c was 12.9 02/08/2021; this is up from 7.2 on 11/25/2020 and is likely representative of acute infectious process   Incidental COVID-positive:  Asymptomatic.  Initial chest x-ray was consistent with volume overload.  Received 1 dose of monoclonal antibody on admission.   Off isolation   Acute otitis media of the right ear:  Confirmed with otoscopic examination by previous provider Continue p.o. Augmentin and ciprofloxacin eardrops with last dose is due 8/22   ESRD on dialysis:  Patient started on dialysis January 2022; progressed from CKD4  to CKD 5 at that time.  Has known horseshoe kidney Patient tells me that he had been working up until January when his legs started swelling and he was told he would need to go on dialysis Nephrology following   Acute  systolic congestive heart failure/new dx Hypertrophic CM:  Echo obtained during this hospitalization showed EF of 45% with global hypokinesis, moderate TR with associated elevated pulmonary artery pressure of 52 mmHg Echocardiogram in 2021 with a EF 55 to 60%, moderate LVH and pulmonary artery pressure of 46 mmHg This is likely related to patient's underlying hypertensive heart disease but given change in EF with diffuse  global hypokinesis we will ask cardiology to see during hospitalization Volume management as per dialysis Continue carvedilol and Cozaar Continue strict I/O and daily weight Cardiology consultation/Dr Skains rec: - Continue with carvedilol 25 mg twice a day - Not a candidate for Entresto given end-stage renal disease.  He has however currently on losartan 50 mg, angiotensin receptor blocker. -Personally reviewed his echocardiogram, the wall texture thickness as well as RV thickness has a pattern compatible with amyloidosis however his age seems a bit odd for this.  We could always check a PYP scan in the future.   -As outpatient would place a Zio patch monitor for 2 weeks looking for any adverse arrhythmias.  We can help facilitate.  Right toe ulcer:  He was treated with 14 days course of doxycycline ,follows with  podiatry.   Recommend outpatient follow-up.   Right middle toe also has healed very well   Hypertension:  Continue Apresoline, carvedilol, and Cozaar      Data Reviewed: Basic Metabolic Panel: Recent Labs  Lab 02/20/21 0900 02/23/21 0909  NA 128* 127*  K 4.7 5.5*  CL 94* 92*  CO2 19* 22  GLUCOSE 149* 207*  BUN 102* 102*  CREATININE 11.94* 12.42*  CALCIUM 8.1* 8.0*  PHOS 5.7*  --    Liver Function Tests: Recent Labs  Lab 02/20/21 0900  ALBUMIN 2.9*   No results for input(s): LIPASE, AMYLASE in the last 168 hours. No results for input(s): AMMONIA in the last 168 hours. CBC: Recent Labs  Lab 02/20/21 0900  WBC 5.1  HGB 9.1*  HCT 28.2*  MCV 81.7  PLT 264   Cardiac Enzymes: No results for input(s): CKTOTAL, CKMB, CKMBINDEX, TROPONINI in the last 168 hours. BNP (last 3 results) Recent Labs    03/10/20 1410 08/06/20 2012 02/08/21 1905  BNP 617.0* 2,067.3* >4,500.0*    ProBNP (last 3 results) No results for input(s): PROBNP in the last 8760 hours.  CBG: Recent Labs  Lab 02/26/21 0812 02/26/21 1218 02/26/21 1640 02/26/21 2036  02/27/21 0655  GLUCAP 127* 202* 145* 192* 141*    No results found for this or any previous visit (from the past 240 hour(s)).   Studies: No results found.  Scheduled Meds:  (feeding supplement) PROSource Plus  30 mL Oral BID BM   amoxicillin-clavulanate  1 tablet Oral Q24H   atorvastatin  40 mg Oral Daily   carvedilol  25 mg Oral BID WC   Chlorhexidine Gluconate Cloth  6 each Topical Q0600   ciprofloxacin-fluocinolone PF  0.25 mL Right EAR BID   darbepoetin (ARANESP) injection - DIALYSIS  60 mcg Intravenous Q Mon-HD   doxercalciferol  5 mcg Intravenous Q M,W,F-HD   heparin  5,000 Units Subcutaneous Q12H   imipramine  25 mg Oral QHS   insulin aspart  0-5 Units Subcutaneous QHS   insulin aspart  0-6 Units Subcutaneous TID WC   insulin glargine-yfgn  20 Units Subcutaneous QHS   labetalol  10 mg Intravenous Once   loratadine  10 mg Oral Daily   losartan  50 mg Oral QHS   multivitamin  1 tablet Oral QHS   sevelamer carbonate  1,600 mg Oral TID WC   sodium chloride flush  3 mL Intravenous Q12H   Continuous Infusions:  sodium chloride     promethazine (PHENERGAN) injection (IM or IVPB)      Principal Problem:   Seizure-like activity (HCC) Active Problems:   Essential hypertension   DM2 (diabetes mellitus, type 2) (HCC)   Anemia of chronic kidney failure   Diabetic foot ulcer ([place])   Syncope   DKA (diabetic ketoacidosis) ([place])   Hypertrophic cardiomyopathy [facility])   Consultants: Nephrology Urology  Procedures: Echocardiogram  Antibiotics: Doxycycline 7/31 through 8/2 Doxycycline 8/5 through 8/13 Augmentin 8/15 >>   Time spent: 35 minutes    Erin Hearing ANP  [facility] 7 am - 330 pm/M-F for direct patient care and secure chat Please refer to Amion for contact info 19  days

## 2021-02-27 NOTE — Progress Notes (Signed)
[facility] Progress Note  Subjective: no complaints voiced.  On HD today, tolerating well.    Vitals:   02/27/21 0930 02/27/21 1000 02/27/21 1030 02/27/21 1100  BP: (!) 190/107 (!) 174/98 (!) 145/74 (!) 175/93  Pulse: 78 80 82 83  Resp:      Temp:      TempSrc:      SpO2:      Weight:      Height:        Exam: General: well appearing, nad Heart: RRR, no m,r,g Lungs: Clear, bilaterally, normal WOB Abdomen: soft non-tender Extremities: No sig LE edema Access: L forearm AVF +bruit       CXR 8/14 - mild vasc congestion    OP HD: SW MWF  4h  450/800  86.5kg  2/2 bath  L AVF  Hep none -[name] 5 mcg IV TIW -Venofer 100 mcg IV X 10 doses 3/7 doses given last dose 02/04/21 -Mircera 225 mcg IV q 2 weeks (last dose 02/02/2021)  Assessment/ Plan Volume overload - pt quite vol overloaded by wts, have done standing wts and they corroborate. Needs strict fluid restriction. Moved to renal floor for better dietary discipline. Max UF as tolerated to get volume down.  ESRD - on HD MWF.  Next HD 8/19  HHS/DMT2: sp IV insulin on SQ insulin now AMS: resolved, thought to be related to elevated glucose +/- seizure. CT of head without acute changes/MRI neg as well. Neuro involved no anti sz meds req COVID 19- Pt is now off COVID isolation HTN:  Cont home meds. On Coreg 25 bid, Cozaar 50. UF with HD and continue to lower volume as tolerated.   May need increase in Cozaar, once we achieve EDW. Anemia  - Hgb 9s. Aranesp 60 q week starting 8/15.  Metabolic bone disease -  Ca ok. Increase Renvela to 2 tabs PO TID AC. Continue VDRA.  R toe ulcer  -Completed doxycycline  Nutrition -Renal Carb mod diet, add nepro, renal vits. Disposition -  Unclear. Prev living with aunt but unable to care for him. SW/CM involved, no plan yet.     Madelon Lips MD [facility] [phone] 02/27/2021,11:26 AM    Recent Labs  Lab 02/23/21 0909 02/27/21 0840  K 5.5* 4.9   BUN 102* 71*  CREATININE 12.42* 9.97*  CALCIUM 8.0* 8.7*  PHOS  --  5.3*  HGB  --  9.2*   Inpatient medications:  (feeding supplement) PROSource Plus  30 mL Oral BID BM   amoxicillin-clavulanate  1 tablet Oral Q24H   atorvastatin  40 mg Oral Daily   carvedilol  25 mg Oral BID WC   Chlorhexidine Gluconate Cloth  6 each Topical Q0600   ciprofloxacin-fluocinolone PF  0.25 mL Right EAR BID   darbepoetin (ARANESP) injection - DIALYSIS  60 mcg Intravenous Q Mon-HD   doxercalciferol  5 mcg Intravenous Q M,W,F-HD   heparin  5,000 Units Subcutaneous Q12H   imipramine  25 mg Oral QHS   insulin aspart  0-5 Units Subcutaneous QHS   insulin aspart  0-6 Units Subcutaneous TID WC   insulin glargine-yfgn  20 Units Subcutaneous QHS   labetalol  10 mg Intravenous Once   loratadine  10 mg Oral Daily   losartan  50 mg Oral QHS   multivitamin  1 tablet Oral QHS   sevelamer carbonate  1,600 mg Oral TID WC   sodium chloride flush  3 mL Intravenous Q12H    sodium  chloride     promethazine (PHENERGAN) injection (IM or IVPB)     sodium chloride, acetaminophen, butalbital-acetaminophen-caffeine, dextrose, hydrALAZINE, hydrOXYzine, promethazine (PHENERGAN) injection (IM or IVPB), sodium chloride flush

## 2021-02-28 DIAGNOSIS — R569 Unspecified convulsions: Secondary | ICD-10-CM | POA: Diagnosis not present

## 2021-02-28 LAB — GLUCOSE, CAPILLARY
Glucose-Capillary: 202 mg/dL — ABNORMAL HIGH (ref 70–99)
Glucose-Capillary: 122 mg/dL — ABNORMAL HIGH (ref 70–99)
Glucose-Capillary: 147 mg/dL — ABNORMAL HIGH (ref 70–99)
Glucose-Capillary: 191 mg/dL — ABNORMAL HIGH (ref 70–99)
Glucose-Capillary: 194 mg/dL — ABNORMAL HIGH (ref 70–99)

## 2021-02-28 NOTE — Progress Notes (Signed)
[facility] PROGRESS NOTE  [name] [id number] DOB: 05-Jan-1986 DOA: 02/08/2021 PCP: Gildardo Pounds, NP  Status:  Remains inpatient appropriate because:Unsafe d/c plan  Dispo: The patient is from: Home              Anticipated d/c is to:  Unknown- pt disability pending and o/w has no income-may need to look for group home              Patient currently is medically stable to d/c.   Difficult to place patient Yes    Level of care: Med-Surg  Code Status: Full Family Communication: Patient only DVT prophylaxis: Subcutaneous heparin COVID vaccination: Unknown  02/28/2021: Patient seen.  No new changes.  Awaiting disposition.  No complaints today.  Patient denies chest pain, shortness of breath, fever or chills.  HPI: 35 year old male with history of ESRD on dialysis,IDDM on insulin, HTN, HLD, legally blind, presented with syncope versus seizure.  Blood glucose were severely elevated on presentation.  He was thought to have seizures from elevated blood glucose levels.  Found to have incidental positive for COVID, no respiratory issues.  He used to live with his aunt but she is unable to take him back home.    Patient is awaiting disposition.  Subjective: No complaints. No fever or chills. No shortness of breath. No chest pain.  Objective: Vitals:   02/28/21 0523 02/28/21 1007  BP: (!) 151/98 (!) 164/95  Pulse: 72 78  Resp: 18 18  Temp: 98.6 F (37 C)   SpO2: 98% 94%    Intake/Output Summary (Last 24 hours) at 02/28/2021 1143 Last data filed at 02/28/2021 0900 Gross per 24 hour  Intake 1269 ml  Output 0 ml  Net 1269 ml   Filed Weights   02/25/21 1740 02/26/21 0604 02/27/21 0815  Weight: 95.8 kg 96.7 kg 99.7 kg   Exam: Constitutional: NAD, mildly uncomfortable secondary to abdominal anasarca Respiratory: Cheerier lung sounds clear to auscultation.  Stable on room air during dialysis Cardiovascular: Normal heart sounds, normotensive, trace to no  peripheral edema.  Has left upper extremity AVF Abdomen: no tenderness, slightly distended but not tympanitic and nontender bowel sounds positive. LBM 8/16 Neurologic: CN 2-12 grossly intact. Sensation intact, DStrength 5/5 x all 4 extremities.  Psychiatric: Normal judgment and insight. Alert and oriented x 3. Normal mood.    Assessment/Plan: Acute problems: Acute metabolic encephalopathy:  Resolved Initially thought to have seizure activity from hyperglycemia.   EEG with questionable dysfunction in the left temporal region.  MRI was negative Patient is legally blind.   Hyperglycemia/insulin-dependent diabetes type 2:  Ongoing diabetes greater than 10 years and this is likely the etiology to his renal failure Continue Semglee 20 units HS with very sensitive SSI Hemoglobin A1c was 12.9 02/08/2021; this is up from 7.2 on 11/25/2020 and is likely representative of acute infectious process 02/28/2021: Continue to optimize blood sugar control.   Incidental COVID-positive:  Asymptomatic.  Initial chest x-ray was consistent with volume overload.  Received 1 dose of monoclonal antibody on admission.   Off isolation   Acute otitis media of the right ear:  Confirmed with otoscopic examination by previous provider Continue p.o. Augmentin and ciprofloxacin eardrops with last dose is due 8/22   ESRD on dialysis:  Patient started on dialysis January 2022; progressed from CKD4  to CKD 5 at that time.  Has known horseshoe kidney Patient tells me that he had been working up until January when his legs started swelling  and he was told he would need to go on dialysis Nephrology following 02/28/2021: Continue hemodialysis as per schedule.   Acute systolic congestive heart failure/new dx Hypertrophic CM:  Echo obtained during this hospitalization showed EF of 45% with global hypokinesis, moderate TR with associated elevated pulmonary artery pressure of 52 mmHg Echocardiogram in 2021 with a EF 55 to 60%,  moderate LVH and pulmonary artery pressure of 46 mmHg This is likely related to patient's underlying hypertensive heart disease but given change in EF with diffuse global hypokinesis we will ask cardiology to see during hospitalization Volume management as per dialysis Continue carvedilol and Cozaar Continue strict I/O and daily weight Cardiology consultation/Dr Skains rec: - Continue with carvedilol 25 mg twice a day - Not a candidate for Entresto given end-stage renal disease.  He has however currently on losartan 50 mg, angiotensin receptor blocker. -Personally reviewed his echocardiogram, the wall texture thickness as well as RV thickness has a pattern compatible with amyloidosis however his age seems a bit odd for this.  We could always check a PYP scan in the future.   -As outpatient would place a Zio patch monitor for 2 weeks looking for any adverse arrhythmias.  We can help facilitate. 02/28/2021: Compensated.  Right toe ulcer:  He was treated with 14 days course of doxycycline ,follows with  podiatry.   Recommend outpatient follow-up.   Right middle toe also has healed very well   Hypertension:  Continue Apresoline, carvedilol, and Cozaar Data Reviewed: Basic Metabolic Panel: Recent Labs  Lab 02/23/21 0909 02/27/21 0840  NA 127* 130*  K 5.5* 4.9  CL 92* 94*  CO2 22 23  GLUCOSE 207* 178*  BUN 102* 71*  CREATININE 12.42* 9.97*  CALCIUM 8.0* 8.7*  PHOS  --  5.3*   Liver Function Tests:  No results for input(s): LIPASE, AMYLASE in the last 168 hours. No results for input(s): AMMONIA in the last 168 hours. CBC: Recent Labs  Lab 02/27/21 0840  WBC 5.1  HGB 9.2*  HCT 29.5*  MCV 82.9  PLT 234    Cardiac Enzymes: No results for input(s): CKTOTAL, CKMB, CKMBINDEX, TROPONINI in the last 168 hours. BNP (last 3 results) Recent Labs    03/10/20 1410 08/06/20 2012 02/08/21 1905  BNP 617.0* 2,067.3* >4,500.0*     ProBNP (last 3 results) No results for input(s):  PROBNP in the last 8760 hours.  CBG:  No results found for this or any previous visit (from the past 240 hour(s)).   Studies: No results found.  Scheduled Meds:  (feeding supplement) PROSource Plus  30 mL Oral BID BM   amoxicillin-clavulanate  1 tablet Oral Q24H   atorvastatin  40 mg Oral Daily   carvedilol  25 mg Oral BID WC   Chlorhexidine Gluconate Cloth  6 each Topical Q0600   darbepoetin (ARANESP) injection - DIALYSIS  60 mcg Intravenous Q Mon-HD   doxercalciferol  5 mcg Intravenous Q M,W,F-HD   heparin  5,000 Units Subcutaneous Q12H   imipramine  25 mg Oral QHS   insulin aspart  0-5 Units Subcutaneous QHS   insulin aspart  0-6 Units Subcutaneous TID WC   insulin glargine-yfgn  20 Units Subcutaneous QHS   labetalol  10 mg Intravenous Once   losartan  50 mg Oral QHS   multivitamin  1 tablet Oral QHS   sevelamer carbonate  1,600 mg Oral TID WC   sodium chloride flush  3 mL Intravenous Q12H   Continuous Infusions:  sodium  chloride     promethazine (PHENERGAN) injection (IM or IVPB)      Principal Problem:   Seizure-like activity (HCC) Active Problems:   Essential hypertension   DM2 (diabetes mellitus, type 2) ([place])   Anemia of chronic kidney failure   Diabetic foot ulcer ([place])   Syncope   DKA (diabetic ketoacidosis) ([place])   Hypertrophic cardiomyopathy [facility])   Consultants: Nephrology Urology  Procedures: Echocardiogram  Antibiotics: Doxycycline 7/31 through 8/2 Doxycycline 8/5 through 8/13 Augmentin 8/15 >>  Time spent: 25 minutes  Bonnell Public ANP  [facility] Please refer to Amion for contact info 20  days

## 2021-02-28 NOTE — Progress Notes (Addendum)
I have personally seen and examined this patient and agree with the assessment/plan as outlined below.  No complaints today.  Curled up in bed, covers over head but does remove them when we talked.  HD on schedule, next Monday. Herma Mering 02/28/2021 11:35 AM    [facility] Progress Note   Subjective: Seen in room. Sleeping, no C/Os when awakened. HD yesterday, Net UF not recorded, no post wt.   Objective Vitals:   02/27/21 1725 02/27/21 2132 02/28/21 0523 02/28/21 1007  BP: (!) 151/89 (!) 148/92 (!) 151/98 (!) 164/95  Pulse: 88 73 72 78  Resp: '17 18 18 18  '$ Temp: 98.5 F (36.9 C) 97.9 F (36.6 C) 98.6 F (37 C)   TempSrc:  Oral Oral   SpO2: 98% 94% 98% 94%  Weight:      Height:       Physical Exam General: Well appearing male in NAD Heart: S1,S2 RRR no M/R/G Lungs: CTAB A/P Abdomen: Active BS Extremities: No LE edema Dialysis Access: L AVF + T/B   Additional Objective Labs: Basic Metabolic Panel: Recent Labs  Lab 02/23/21 0909 02/27/21 0840  NA 127* 130*  K 5.5* 4.9  CL 92* 94*  CO2 22 23  GLUCOSE 207* 178*  BUN 102* 71*  CREATININE 12.42* 9.97*  CALCIUM 8.0* 8.7*  PHOS  --  5.3*   Liver Function Tests: Recent Labs  Lab 02/27/21 0840  ALBUMIN 3.1*   No results for input(s): LIPASE, AMYLASE in the last 168 hours. CBC: Recent Labs  Lab 02/27/21 0840  WBC 5.1  HGB 9.2*  HCT 29.5*  MCV 82.9  PLT 234   Blood Culture    Component Value Date/Time   SDES BLOOD BLOOD RIGHT FOREARM 11/24/2020 1955   SPECREQUEST  11/24/2020 1955    BOTTLES DRAWN AEROBIC AND ANAEROBIC Blood Culture adequate volume   CULT  11/24/2020 1955    NO GROWTH 6 DAYS Performed at [facility], [place] [address] [place], [place] [address]    REPTSTATUS 11/30/2020 FINAL 11/24/2020 1955    Cardiac Enzymes: No results for input(s): CKTOTAL, CKMB, CKMBINDEX, TROPONINI in the last 168 hours. CBG: Recent Labs  Lab 02/27/21 0655 02/27/21 1335  02/27/21 1650 02/27/21 2137 02/28/21 0742  GLUCAP 141* 164* 208* 202* 122*   Iron Studies: No results for input(s): IRON, TIBC, TRANSFERRIN, FERRITIN in the last 72 hours. '@lablastinr3'$ @ Studies/Results: No results found. Medications:  sodium chloride     promethazine (PHENERGAN) injection (IM or IVPB)      (feeding supplement) PROSource Plus  30 mL Oral BID BM   amoxicillin-clavulanate  1 tablet Oral Q24H   atorvastatin  40 mg Oral Daily   carvedilol  25 mg Oral BID WC   Chlorhexidine Gluconate Cloth  6 each Topical Q0600   darbepoetin (ARANESP) injection - DIALYSIS  60 mcg Intravenous Q Mon-HD   doxercalciferol  5 mcg Intravenous Q M,W,F-HD   heparin  5,000 Units Subcutaneous Q12H   imipramine  25 mg Oral QHS   insulin aspart  0-5 Units Subcutaneous QHS   insulin aspart  0-6 Units Subcutaneous TID WC   insulin glargine-yfgn  20 Units Subcutaneous QHS   labetalol  10 mg Intravenous Once   losartan  50 mg Oral QHS   multivitamin  1 tablet Oral QHS   sevelamer carbonate  1,600 mg Oral TID WC   sodium chloride flush  3 mL Intravenous Q12H     OP HD: SW MWF  Dialysis Orders: [facility] MWF 4 hrs 180NRe 450/800 86.5 kg 2.0 K/2.0 Ca L AVF -Heparin-none -[name] 5 mcg IV TIW -Venofer 100 mcg IV X 10 doses 3/7 doses given last dose 02/04/21 -Mircera 225 mcg IV q 2 weeks (last dose 02/02/2021)     Assessment/ Plan Volume overload - pt quite vol overloaded by wts, have done standing wts and they corroborate. Needs strict fluid restriction. Moved to renal floor for better dietary discipline. Max UF as tolerated to get volume down.  ESRD - on HD MWF.  Next HD 03/02/21 HHS/DMT2: sp IV insulin on SQ insulin now AMS: resolved, thought to be related to elevated glucose +/- seizure. CT of head without acute changes/MRI neg as well. Neuro involved no anti sz meds req COVID 19- Pt is now off COVID isolation HTN:  Cont home meds. On Coreg 25 bid, Cozaar 50. UF with HD and continue to lower  volume as tolerated.   May need increase in Cozaar, once we achieve EDW. Anemia  - Hgb 9s. Aranesp 60 q week starting 8/15.  Metabolic bone disease -  Ca ok. Increase Renvela to 2 tabs PO TID AC. Continue VDRA.  R toe ulcer  -Completed doxycycline  Nutrition -Renal Carb mod diet, add nepro, renal vits. Disposition -  Unclear. Prev living with aunt but unable to care for him. SW/CM involved, no plan yet.     Rita H. Brown NP-C 02/28/2021, 10:26 AM  [employer] [phone]

## 2021-03-01 DIAGNOSIS — R569 Unspecified convulsions: Secondary | ICD-10-CM | POA: Diagnosis not present

## 2021-03-01 LAB — RENAL FUNCTION PANEL
Albumin: 3.2 g/dL — ABNORMAL LOW (ref 3.5–5.0)
Anion gap: 12 (ref 5–15)
BUN: 62 mg/dL — ABNORMAL HIGH (ref 6–20)
CO2: 25 mmol/L (ref 22–32)
Calcium: 8.9 mg/dL (ref 8.9–10.3)
Chloride: 93 mmol/L — ABNORMAL LOW (ref 98–111)
Creatinine, Ser: 8.94 mg/dL — ABNORMAL HIGH (ref 0.61–1.24)
GFR, Estimated: 7 mL/min — ABNORMAL LOW (ref >60)
Glucose, Bld: 144 mg/dL — ABNORMAL HIGH (ref 70–99)
Phosphorus: 5.3 mg/dL — ABNORMAL HIGH (ref 2.5–4.6)
Potassium: 4.3 mmol/L (ref 3.5–5.1)
Sodium: 130 mmol/L — ABNORMAL LOW (ref 135–145)

## 2021-03-01 LAB — GLUCOSE, CAPILLARY
Glucose-Capillary: 84 mg/dL (ref 70–99)
Glucose-Capillary: 123 mg/dL — ABNORMAL HIGH (ref 70–99)
Glucose-Capillary: 191 mg/dL — ABNORMAL HIGH (ref 70–99)

## 2021-03-01 NOTE — Progress Notes (Signed)
[facility] PROGRESS NOTE  [name] [id number] DOB: [birth year] DOA: 02/08/2021 PCP: Gildardo Pounds, NP  Status:  Remains inpatient appropriate because:Unsafe d/c plan  Dispo: The patient is from: Home              Anticipated d/c is to:  Unknown- pt disability pending and o/w has no income-may need to look for group home              Patient currently is medically stable to d/c.   Difficult to place patient Yes    Level of care: Med-Surg  Code Status: Full Family Communication: Patient only DVT prophylaxis: Subcutaneous heparin COVID vaccination: Unknown  03/01/2021: Patient seen.  No new changes.  Awaiting disposition.  No complaints today.  Patient denies chest pain, shortness of breath, fever or chills.  HPI: 35 year old male with history of ESRD on dialysis,IDDM on insulin, HTN, HLD, legally blind, presented with syncope versus seizure.  Blood glucose were severely elevated on presentation.  He was thought to have seizures from elevated blood glucose levels.  Found to have incidental positive for COVID, no respiratory issues.  He used to live with his aunt but she is unable to take him back home.    Patient is awaiting disposition.  Subjective: No complaints. No fever or chills. No shortness of breath. No chest pain.  Objective: Vitals:   03/01/21 0651 03/01/21 0910  BP: (!) 184/116 (!) 154/89  Pulse: 71 78  Resp:  18  Temp: 98.2 F (36.8 C) 98.8 F (37.1 C)  SpO2: 100% 97%    Intake/Output Summary (Last 24 hours) at 03/01/2021 1613 Last data filed at 03/01/2021 1322 Gross per 24 hour  Intake 716 ml  Output --  Net 716 ml    Filed Weights   02/25/21 1740 02/26/21 0604 02/27/21 0815  Weight: 95.8 kg 96.7 kg 99.7 kg   Exam: Constitutional: NAD, mildly uncomfortable secondary to abdominal anasarca Respiratory: Cheerier lung sounds clear to auscultation.  Stable on room air during dialysis Cardiovascular: Normal heart sounds, normotensive,  trace to no peripheral edema.  Has left upper extremity AVF Abdomen: no tenderness, slightly distended but not tympanitic and nontender bowel sounds positive. LBM 8/16 Neurologic: CN 2-12 grossly intact. Sensation intact, DStrength 5/5 x all 4 extremities.  Psychiatric: Normal judgment and insight. Alert and oriented x 3. Normal mood.    Assessment/Plan: Acute problems: Acute metabolic encephalopathy:  Resolved Initially thought to have seizure activity from hyperglycemia.   EEG with questionable dysfunction in the left temporal region.  MRI was negative Patient is legally blind.   Hyperglycemia/insulin-dependent diabetes type 2:  Ongoing diabetes greater than 10 years and this is likely the etiology to his renal failure Continue Semglee 20 units HS with very sensitive SSI Hemoglobin A1c was 12.9 02/08/2021; this is up from 7.2 on 11/25/2020 and is likely representative of acute infectious process 02/28/2021: Continue to optimize blood sugar control.   Incidental COVID-positive:  Asymptomatic.  Initial chest x-ray was consistent with volume overload.  Received 1 dose of monoclonal antibody on admission.   Off isolation   Acute otitis media of the right ear:  Confirmed with otoscopic examination by previous provider Continue p.o. Augmentin and ciprofloxacin eardrops with last dose is due 8/22   ESRD on dialysis:  Patient started on dialysis January 2022; progressed from CKD4  to CKD 5 at that time.  Has known horseshoe kidney Patient tells me that he had been working up until January when his  legs started swelling and he was told he would need to go on dialysis Nephrology following 02/28/2021: Continue hemodialysis as per schedule.   Acute systolic congestive heart failure/new dx Hypertrophic CM:  Echo obtained during this hospitalization showed EF of 45% with global hypokinesis, moderate TR with associated elevated pulmonary artery pressure of 52 mmHg Echocardiogram in 2021 with a EF  55 to 60%, moderate LVH and pulmonary artery pressure of 46 mmHg This is likely related to patient's underlying hypertensive heart disease but given change in EF with diffuse global hypokinesis we will ask cardiology to see during hospitalization Volume management as per dialysis Continue carvedilol and Cozaar Continue strict I/O and daily weight Cardiology consultation/Dr Skains rec: - Continue with carvedilol 25 mg twice a day - Not a candidate for Entresto given end-stage renal disease.  He has however currently on losartan 50 mg, angiotensin receptor blocker. -Personally reviewed his echocardiogram, the wall texture thickness as well as RV thickness has a pattern compatible with amyloidosis however his age seems a bit odd for this.  We could always check a PYP scan in the future.   -As outpatient would place a Zio patch monitor for 2 weeks looking for any adverse arrhythmias.  We can help facilitate. 02/28/2021: Compensated.  Right toe ulcer:  He was treated with 14 days course of doxycycline ,follows with  podiatry.   Recommend outpatient follow-up.   Right middle toe also has healed very well   Hypertension:  Continue Apresoline, carvedilol, and Cozaar Data Reviewed: Basic Metabolic Panel: Recent Labs  Lab 02/23/21 0909 02/27/21 0840 03/01/21 0117  NA 127* 130* 130*  K 5.5* 4.9 4.3  CL 92* 94* 93*  CO2 '22 23 25  '$ GLUCOSE 207* 178* 144*  BUN 102* 71* 62*  CREATININE 12.42* 9.97* 8.94*  CALCIUM 8.0* 8.7* 8.9  PHOS  --  5.3* 5.3*   Liver Function Tests:  No results for input(s): LIPASE, AMYLASE in the last 168 hours. No results for input(s): AMMONIA in the last 168 hours. CBC: Recent Labs  Lab 02/27/21 0840  WBC 5.1  HGB 9.2*  HCT 29.5*  MCV 82.9  PLT 234    Cardiac Enzymes: No results for input(s): CKTOTAL, CKMB, CKMBINDEX, TROPONINI in the last 168 hours. BNP (last 3 results) Recent Labs    03/10/20 1410 08/06/20 2012 02/08/21 1905  BNP 617.0* 2,067.3*  >4,500.0*     ProBNP (last 3 results) No results for input(s): PROBNP in the last 8760 hours.  CBG:  No results found for this or any previous visit (from the past 240 hour(s)).   Studies: No results found.  Scheduled Meds:  (feeding supplement) PROSource Plus  30 mL Oral BID BM   atorvastatin  40 mg Oral Daily   carvedilol  25 mg Oral BID WC   Chlorhexidine Gluconate Cloth  6 each Topical Q0600   darbepoetin (ARANESP) injection - DIALYSIS  60 mcg Intravenous Q Mon-HD   doxercalciferol  5 mcg Intravenous Q M,W,F-HD   heparin  5,000 Units Subcutaneous Q12H   imipramine  25 mg Oral QHS   insulin aspart  0-5 Units Subcutaneous QHS   insulin aspart  0-6 Units Subcutaneous TID WC   insulin glargine-yfgn  20 Units Subcutaneous QHS   labetalol  10 mg Intravenous Once   losartan  50 mg Oral QHS   multivitamin  1 tablet Oral QHS   sevelamer carbonate  1,600 mg Oral TID WC   sodium chloride flush  3 mL Intravenous Q12H  Continuous Infusions:  sodium chloride     promethazine (PHENERGAN) injection (IM or IVPB)      Principal Problem:   Seizure-like activity (HCC) Active Problems:   Essential hypertension   DM2 (diabetes mellitus, type 2) (HCC)   Anemia of chronic kidney failure   Diabetic foot ulcer ([place])   Syncope   DKA (diabetic ketoacidosis) ([place])   Hypertrophic cardiomyopathy [facility])   Consultants: Nephrology Urology  Procedures: Echocardiogram  Antibiotics: Doxycycline 7/31 through 8/2 Doxycycline 8/5 through 8/13 Augmentin 8/15 >>  Time spent: 25 minutes  Bonnell Public ANP  [facility] Please refer to Amion for contact info 21  days

## 2021-03-01 NOTE — Progress Notes (Addendum)
I have personally seen and examined this patient and agree with the assessment/plan as outlined below.  ? If his weights are bed or standing.  Will request standing for accuracy. [name] 03/01/2021 1:46 PM    [facility] Progress Note   Subjective: Seen in room. No C/Os. HD tomorrow on schedule.      Objective Vitals:   02/28/21 1700 02/28/21 2030 03/01/21 0651 03/01/21 0910  BP: (!) 160/90 (!) 146/82 (!) 184/116 (!) 154/89  Pulse: 80  71 78  Resp: 18   18  Temp:  99.3 F (37.4 C) 98.2 F (36.8 C) 98.8 F (37.1 C)  TempSrc:  Oral Oral Oral  SpO2: 95% 100% 100% 97%  Weight:      Height:       Physical Exam General: Well appearing male in NAD Heart: S1,S2 RRR no M/R/G Lungs: CTAB A/P Abdomen: Active BS Extremities: No LE edema Dialysis Access: L AVF + T/B     Additional Objective Labs: Basic Metabolic Panel: Recent Labs  Lab 02/23/21 0909 02/27/21 0840 03/01/21 0117  NA 127* 130* 130*  K 5.5* 4.9 4.3  CL 92* 94* 93*  CO2 '22 23 25  '$ GLUCOSE 207* 178* 144*  BUN 102* 71* 62*  CREATININE 12.42* 9.97* 8.94*  CALCIUM 8.0* 8.7* 8.9  PHOS  --  5.3* 5.3*   Liver Function Tests: Recent Labs  Lab 02/27/21 0840 03/01/21 0117  ALBUMIN 3.1* 3.2*   No results for input(s): LIPASE, AMYLASE in the last 168 hours. CBC: Recent Labs  Lab 02/27/21 0840  WBC 5.1  HGB 9.2*  HCT 29.5*  MCV 82.9  PLT 234   Blood Culture    Component Value Date/Time   SDES BLOOD BLOOD RIGHT FOREARM 11/24/2020 1955   SPECREQUEST  11/24/2020 1955    BOTTLES DRAWN AEROBIC AND ANAEROBIC Blood Culture adequate volume   CULT  11/24/2020 1955    NO GROWTH 6 DAYS Performed at [facility], [place] [address], [place], [place] [address]    REPTSTATUS 11/30/2020 FINAL 11/24/2020 1955    Cardiac Enzymes: No results for input(s): CKTOTAL, CKMB, CKMBINDEX, TROPONINI in the last 168 hours. CBG: Recent Labs  Lab 02/28/21 0742 02/28/21 1147 02/28/21 1629  02/28/21 2044 03/01/21 0649  GLUCAP 122* 147* 191* 194* 84   Iron Studies: No results for input(s): IRON, TIBC, TRANSFERRIN, FERRITIN in the last 72 hours. '@lablastinr3'$ @ Studies/Results: No results found. Medications:  sodium chloride     promethazine (PHENERGAN) injection (IM or IVPB)      (feeding supplement) PROSource Plus  30 mL Oral BID BM   amoxicillin-clavulanate  1 tablet Oral Q24H   atorvastatin  40 mg Oral Daily   carvedilol  25 mg Oral BID WC   Chlorhexidine Gluconate Cloth  6 each Topical Q0600   darbepoetin (ARANESP) injection - DIALYSIS  60 mcg Intravenous Q Mon-HD   doxercalciferol  5 mcg Intravenous Q M,W,F-HD   heparin  5,000 Units Subcutaneous Q12H   imipramine  25 mg Oral QHS   insulin aspart  0-5 Units Subcutaneous QHS   insulin aspart  0-6 Units Subcutaneous TID WC   insulin glargine-yfgn  20 Units Subcutaneous QHS   labetalol  10 mg Intravenous Once   losartan  50 mg Oral QHS   multivitamin  1 tablet Oral QHS   sevelamer carbonate  1,600 mg Oral TID WC   sodium chloride flush  3 mL Intravenous Q12H     OP HD: SW MWF  Dialysis Orders: [facility] MWF 4 hrs 180NRe 450/800 86.5 kg 2.0 K/2.0 Ca L AVF -Heparin-none -[name] 5 mcg IV TIW -Venofer 100 mcg IV X 10 doses 3/7 doses given last dose 02/04/21 -Mircera 225 mcg IV q 2 weeks (last dose 02/02/2021)     Assessment/ Plan Volume overload - pt quite vol overloaded by wts, have done standing wts and they corroborate. Needs strict fluid restriction. Moved to renal floor for better dietary discipline. Max UF as tolerated to get volume down. Still very much above OP EDW.  Hypertrophic CM-new Dx this Admit. Avoid tachycardia. Per primary Acute systolic HF-EF down to [id number] this admission. Manage volume with HD.  ESRD - on HD MWF.  Next HD 03/02/21 HHS/DMT2: sp IV insulin on SQ insulin now AMS: resolved, thought to be related to elevated glucose +/- seizure. CT of head without acute changes/MRI neg as well.  Neuro involved no anti sz meds req COVID 19- Pt is now off COVID isolation.  HTN:  Cont home meds. On Coreg 25 bid, Cozaar 50. UF with HD and continue to lower volume as tolerated.   May need increase in Cozaar, once we achieve EDW. Anemia  - Hgb 9s. Aranesp 60 q week starting 8/15.  Metabolic bone disease -  Ca ok. Increase Renvela to 2 tabs PO TID AC. Continue VDRA.  R toe ulcer  -Completed doxycycline  Nutrition -Renal Carb mod diet, add nepro, renal vits. Disposition -  Unclear. Prev living with aunt but unable to care for him. SW/CM involved, no plan yet.    Rita H. Brown NP-C 03/01/2021, 10:37 AM  [employer] [phone]

## 2021-03-02 DIAGNOSIS — R569 Unspecified convulsions: Secondary | ICD-10-CM | POA: Diagnosis not present

## 2021-03-02 LAB — RENAL FUNCTION PANEL
Albumin: 3.3 g/dL — ABNORMAL LOW (ref 3.5–5.0)
Anion gap: 14 (ref 5–15)
BUN: 83 mg/dL — ABNORMAL HIGH (ref 6–20)
CO2: 22 mmol/L (ref 22–32)
Calcium: 8.6 mg/dL — ABNORMAL LOW (ref 8.9–10.3)
Chloride: 93 mmol/L — ABNORMAL LOW (ref 98–111)
Creatinine, Ser: 10.82 mg/dL — ABNORMAL HIGH (ref 0.61–1.24)
GFR, Estimated: 6 mL/min — ABNORMAL LOW (ref >60)
Glucose, Bld: 88 mg/dL (ref 70–99)
Phosphorus: 6 mg/dL — ABNORMAL HIGH (ref 2.5–4.6)
Potassium: 4.5 mmol/L (ref 3.5–5.1)
Sodium: 129 mmol/L — ABNORMAL LOW (ref 135–145)

## 2021-03-02 LAB — GLUCOSE, CAPILLARY
Glucose-Capillary: 91 mg/dL (ref 70–99)
Glucose-Capillary: 71 mg/dL (ref 70–99)
Glucose-Capillary: 172 mg/dL — ABNORMAL HIGH (ref 70–99)
Glucose-Capillary: 199 mg/dL — ABNORMAL HIGH (ref 70–99)

## 2021-03-02 LAB — CBC
HCT: 28.5 % — ABNORMAL LOW (ref 39.0–52.0)
Hemoglobin: 9 g/dL — ABNORMAL LOW (ref 13.0–17.0)
MCH: 25.9 pg — ABNORMAL LOW (ref 26.0–34.0)
MCHC: 31.6 g/dL (ref 30.0–36.0)
MCV: 82.1 fL (ref 80.0–100.0)
Platelets: 200 10*3/uL (ref 150–400)
RBC: 3.47 MIL/uL — ABNORMAL LOW (ref 4.22–5.81)
RDW: 17.9 % — ABNORMAL HIGH (ref 11.5–15.5)
WBC: 4.9 10*3/uL (ref 4.0–10.5)
nRBC: 0 % (ref 0.0–0.2)

## 2021-03-02 MED ORDER — PENTAFLUOROPROP-TETRAFLUOROETH EX AERO: 1.0000 "application " | INHALATION_SPRAY | CUTANEOUS | Status: DC | PRN

## 2021-03-02 MED ORDER — SODIUM CHLORIDE 0.9 % IV SOLN: 100.0000 mL | INTRAVENOUS | Status: DC | PRN

## 2021-03-02 MED ORDER — LIDOCAINE-PRILOCAINE 2.5-2.5 % EX CREA: 1.0000 "application " | TOPICAL_CREAM | CUTANEOUS | Status: DC | PRN

## 2021-03-02 MED ORDER — ALTEPLASE 2 MG IJ SOLR: 2.0000 mg | Freq: Once | INTRAMUSCULAR | Status: DC | PRN

## 2021-03-02 MED ORDER — HEPARIN SODIUM (PORCINE) 1000 UNIT/ML DIALYSIS
1000.0000 [IU] | INTRAMUSCULAR | Status: DC | PRN
Start: 2021-03-03 — End: 2021-03-02

## 2021-03-02 MED ORDER — HYDRALAZINE HCL 25 MG PO TABS
25.0000 mg | ORAL_TABLET | Freq: Four times a day (QID) | ORAL | Status: DC | PRN
  Administered 2021-03-02 – 2021-03-05 (×3): 25 mg via ORAL
  Filled 2021-03-02 (×2): qty 1

## 2021-03-02 MED ORDER — LIDOCAINE HCL (PF) 1 % IJ SOLN
5.0000 mL | INTRAMUSCULAR | Status: DC | PRN
Start: 2021-03-03 — End: 2021-03-02

## 2021-03-02 MED ORDER — SODIUM CHLORIDE 0.9 % IV SOLN
100.0000 mL | INTRAVENOUS | Status: DC | PRN
Start: 2021-03-03 — End: 2021-03-02

## 2021-03-02 MED ORDER — LIDOCAINE HCL (PF) 1 % IJ SOLN: 5.0000 mL | INTRAMUSCULAR | Status: DC | PRN

## 2021-03-02 NOTE — Progress Notes (Signed)
Subjective: Said tolerated 5 L UF dialysis today, no current complaints resting in bed  Objective Vital signs in last 24 hours: Vitals:   03/02/21 1015 03/02/21 1030 03/02/21 1100 03/02/21 1252  BP: (!) 167/79 (!) 179/111 (!) 161/109 (!) 183/105  Pulse: 77 79 73 75  Resp:    18  Temp:    99.2 F (37.3 C)  TempSrc:    Oral  SpO2:    100%  Weight:      Height:       Weight change:   Physical Exam: General: Adult male appears older than stated  not Much verbal direction but alert NAD  Heart: RRR, no MRG Lungs: CTA nonlabored breathing Abdomen: NABS, soft NTND, some abdominal wall edema Extremities: No pedal edema Dialysis Access: Positive bruit left arm AV fistula  OP HD: SW MWF  Dialysis Orders: [facility] [address] MWF 4 hrs 180NRe 450/800 86.5 kg 2.0 K/2.0 Ca L AVF -Heparin-none -[name] 5 mcg IV TIW -Venofer 100 mcg IV X 10 doses 3/7 doses given last dose 02/04/21 -Mircera 225 mcg IV q 2 weeks (last dose 02/02/2021)     Problem/Plan: Volume overload -5 L UF today no post weight noticed, pt quite vol overloaded by wts, have done standing wts and they corroborate. Needs strict fluid restriction. Moved to renal floor for better dietary discipline. Max UF as tolerated to get volume down. Still very much above OP EDW however not in distress Hypertrophic CM-new Dx this Admit. Avoid tachycardia. Per primary Acute systolic HF-EF down to [id number] this admission. Manage volume with HD.  ESRD - on HD MWF.  HD today on schedule HHS/DMT2: sp IV insulin on SQ insulin now AMS: resolved, thought to be related to elevated glucose +/- seizure. CT of head without acute changes/MRI neg as well. Neuro involved no anti sz meds req COVID 19- Pt is now off COVID isolation.  HTN:  Cont home meds. On Coreg 25 bid, Cozaar 50. UF with HD and continue to lower volume as tolerated.   May need increase in Cozaar, once we achieve EDW. Anemia  - Hgb 9.0 . Aranesp 60 q week starting 8/15.  Metabolic bone disease -  Ca  ok.  Phosphorus 6.0 recently increased Renvela to 2 tabs PO TID AC. Continue VDRA.  R toe ulcer  -Completed doxycycline  Nutrition -Renal Carb mod diet, add nepro, renal vits. Disposition -  Unclear. Prev living with aunt but unable to care for him. SW/CM involved, no plan yet.    Ernest Haber, PA-C [facility] Bee[facility] [phone] 03/02/2021,3:01 PM  LOS: 22 days   Labs: Basic Metabolic Panel: Recent Labs  Lab 02/27/21 0840 03/01/21 0117 03/02/21 0745  NA 130* 130* 129*  K 4.9 4.3 4.5  CL 94* 93* 93*  CO2 '23 25 22  '$ GLUCOSE 178* 144* 88  BUN 71* 62* 83*  CREATININE 9.97* 8.94* 10.82*  CALCIUM 8.7* 8.9 8.6*  PHOS 5.3* 5.3* 6.0*   Liver Function Tests: Recent Labs  Lab 02/27/21 0840 03/01/21 0117 03/02/21 0745  ALBUMIN 3.1* 3.2* 3.3*   No results for input(s): LIPASE, AMYLASE in the last 168 hours. No results for input(s): AMMONIA in the last 168 hours. CBC: Recent Labs  Lab 02/27/21 0840 03/02/21 0745  WBC 5.1 4.9  HGB 9.2* 9.0*  HCT 29.5* 28.5*  MCV 82.9 82.1  PLT 234 200   Cardiac Enzymes: No results for input(s): CKTOTAL, CKMB, CKMBINDEX, TROPONINI in the last 168 hours. CBG: Recent Labs  Lab 03/01/21  [id number] 03/01/21 1647 03/01/21 2121 03/02/21 0636 03/02/21 1251  GLUCAP 84 123* 191* 91 71    Studies/Results: No results found. Medications:  sodium chloride     promethazine (PHENERGAN) injection (IM or IVPB)      (feeding supplement) PROSource Plus  30 mL Oral BID BM   atorvastatin  40 mg Oral Daily   carvedilol  25 mg Oral BID WC   Chlorhexidine Gluconate Cloth  6 each Topical Q0600   darbepoetin (ARANESP) injection - DIALYSIS  60 mcg Intravenous Q Mon-HD   doxercalciferol  5 mcg Intravenous Q M,W,F-HD   heparin  5,000 Units Subcutaneous Q12H   imipramine  25 mg Oral QHS   insulin aspart  0-5 Units Subcutaneous QHS   insulin aspart  0-6 Units Subcutaneous TID WC   insulin glargine-yfgn  20 Units Subcutaneous QHS   labetalol  10  mg Intravenous Once   losartan  50 mg Oral QHS   multivitamin  1 tablet Oral QHS   sevelamer carbonate  1,600 mg Oral TID WC   sodium chloride flush  3 mL Intravenous Q12H

## 2021-03-02 NOTE — Plan of Care (Signed)
  Problem: Clinical Measurements: Goal: Will remain free from infection Outcome: Progressing   Problem: Clinical Measurements: Goal: Respiratory complications will improve Outcome: Progressing   Problem: Nutrition: Goal: Adequate nutrition will be maintained Outcome: Progressing

## 2021-03-02 NOTE — Progress Notes (Signed)
[facility] PROGRESS NOTE  [name] [name] DOB: 11/06/1985 DOA: 02/08/2021 PCP: Gildardo Pounds, NP  Status:  Remains inpatient appropriate because:Unsafe d/c plan  Dispo: The patient is from: Home              Anticipated d/c is to:  Unknown- pt disability pending and o/w has no income-may need to look for group home              Patient currently is medically stable to d/c.   Difficult to place patient Yes    Level of care: Med-Surg  Code Status: Full Family Communication: Patient only DVT prophylaxis: Subcutaneous heparin COVID vaccination: Unknown  HPI: 35 year old male with history of ESRD on dialysis,IDDM on insulin, HTN, HLD, legally blind, presented with syncope versus seizure.  Blood glucose were severely elevated on presentation.  He was thought to have seizures from elevated blood glucose levels.  Found to have incidental positive for COVID, no respiratory issues.  He used to live with his aunt but she is unable to take him back home.    Subjective: Examined during dialysis.  States sensation of abdominal bloating improved after dialysis treatments.  Requested that I call [name] regarding status of pending eye surgery [phone]  Objective: Vitals:   03/01/21 2120 03/02/21 0538  BP: (!) 147/97 (!) 142/91  Pulse: 67 71  Resp: 18 18  Temp: 98.7 F (37.1 C) 98.6 F (37 C)  SpO2: 98% 100%    Intake/Output Summary (Last 24 hours) at 03/02/2021 0755 Last data filed at 03/02/2021 0200 Gross per 24 hour  Intake 600 ml  Output --  Net 600 ml    Filed Weights   02/26/21 0604 02/27/21 0815 03/02/21 0600  Weight: 96.7 kg 99.7 kg 99.9 kg    Exam:  Constitutional: NAD, intermittently napping during dialysis Respiratory: Lung sounds are clear to auscultation, stable on room air, no increased work of breathing Cardiovascular: S1-S2, no peripheral edema, normotensive.  Has left upper extremity AVF Abdomen: Nontender, minimally  distended, normoactive bowel sounds.  LBM 8/20 Neurologic: CN 2-12 grossly intact. Sensation intact, DStrength 5/5 x all 4 extremities.  Psychiatric: Normal judgment and insight. Alert and oriented x 3. Normal mood.    Assessment/Plan: Acute problems: Acute metabolic encephalopathy:  Resolved Initially thought to have seizure activity from hyperglycemia.   EEG with questionable dysfunction in the left temporal region.  MRI was negative Patient is legally blind.   Hyperglycemia/insulin-dependent diabetes type 2:  Ongoing diabetes greater than 10 years and this is likely the etiology to his renal failure Continue Semglee 20 units HS with very sensitive SSI Hemoglobin A1c was 12.9 02/08/2021; this is up from 7.2 on 11/25/2020 and is likely representative of acute infectious process   Incidental COVID-positive:  Asymptomatic.  Initial chest x-ray was consistent with volume overload.  Received 1 dose of monoclonal antibody on admission.   Off isolation   Acute otitis media of the right ear:  Confirmed with otoscopic examination by previous provider Continue p.o. Augmentin and ciprofloxacin eardrops with last dose is due 8/22   ESRD on dialysis:  Patient started on dialysis January 2022; progressed from CKD4  to CKD 5 at that time.  Has known horseshoe kidney Nephrology following   Acute systolic congestive heart failure/new dx Hypertrophic CM:  Echo obtained during this hospitalization showed EF of 45% with global hypokinesis, moderate TR with associated elevated pulmonary artery pressure of 52 mmHg Echocardiogram in 2021 with a EF  55 to 60%, moderate LVH and pulmonary artery pressure of 46 mmHg This is likely related to patient's underlying hypertensive heart disease but given change in EF with diffuse global hypokinesis we will ask cardiology to see during hospitalization Volume management as per dialysis Continue carvedilol and Cozaar Continue strict I/O and daily weights Cardiology  consultation/Dr Skains rec: - Continue with carvedilol 25 mg twice a day- Not a candidate for Entresto given end-stage renal disease.  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  We could always check a PYP scan in the future.   -As outpatient would place a Zio patch monitor for 2 weeks looking for any adverse arrhythmias.  We can help facilitate.  Right toe ulcer:  He was treated with 14 days course of doxycycline ,follows with  podiatry.   Recommend outpatient follow-up.   Right middle toe also has healed very well   Hypertension:  Continue Apresoline, carvedilol, and Cozaar      Data Reviewed: Basic Metabolic Panel: Recent Labs  Lab 02/23/21 0909 02/27/21 0840 03/01/21 0117  NA 127* 130* 130*  K 5.5* 4.9 4.3  CL 92* 94* 93*  CO2 '22 23 25  '$ GLUCOSE 207* 178* 144*  BUN 102* 71* 62*  CREATININE 12.42* 9.97* 8.94*  CALCIUM 8.0* 8.7* 8.9  PHOS  --  5.3* 5.3*   Liver Function Tests: Recent Labs  Lab 02/27/21 0840 03/01/21 0117  ALBUMIN 3.1* 3.2*   No results for input(s): LIPASE, AMYLASE in the last 168 hours. No results for input(s): AMMONIA in the last 168 hours. CBC: Recent Labs  Lab 02/27/21 0840  WBC 5.1  HGB 9.2*  HCT 29.5*  MCV 82.9  PLT 234   Cardiac Enzymes: No results for input(s): CKTOTAL, CKMB, CKMBINDEX, TROPONINI in the last 168 hours. BNP (last 3 results) Recent Labs    03/10/20 1410 08/06/20 2012 02/08/21 1905  BNP 617.0* 2,067.3* >4,500.0*    ProBNP (last 3 results) No results for input(s): PROBNP in the last 8760 hours.  CBG: Recent Labs  Lab 02/28/21 2044 03/01/21 0649 03/01/21 1647 03/01/21 2121 03/02/21 0636  GLUCAP 194* 84 123* 191* 91    No results found for this or any previous visit (from the past 240 hour(s)).   Studies: No results found.  Scheduled Meds:  (feeding supplement) PROSource Plus   30 mL Oral BID BM   atorvastatin  40 mg Oral Daily   carvedilol  25 mg Oral BID WC   Chlorhexidine Gluconate Cloth  6 each Topical Q0600   darbepoetin (ARANESP) injection - DIALYSIS  60 mcg Intravenous Q Mon-HD   doxercalciferol  5 mcg Intravenous Q M,W,F-HD   heparin  5,000 Units Subcutaneous Q12H   imipramine  25 mg Oral QHS   insulin aspart  0-5 Units Subcutaneous QHS   insulin aspart  0-6 Units Subcutaneous TID WC   insulin glargine-yfgn  20 Units Subcutaneous QHS   labetalol  10 mg Intravenous Once   losartan  50 mg Oral QHS   multivitamin  1 tablet Oral QHS   sevelamer carbonate  1,600 mg Oral TID WC   sodium chloride flush  3 mL Intravenous Q12H   Continuous Infusions:  sodium chloride     [START ON 03/03/2021] sodium chloride     promethazine (PHENERGAN) injection (IM or IVPB)      Principal Problem:   Seizure-like activity (HCC) Active Problems:  Essential hypertension   DM2 (diabetes mellitus, type 2) ([place])   Anemia of chronic kidney failure   Diabetic foot ulcer ([place])   Syncope   DKA (diabetic ketoacidosis) ([place])   Hypertrophic cardiomyopathy [facility])   Consultants: Nephrology Urology  Procedures: Echocardiogram  Antibiotics: Doxycycline 7/31 through 8/2 Doxycycline 8/5 through 8/13 Augmentin 8/15 >>   Time spent: 20 minutes    Erin Hearing ANP  [facility] 7 am - 330 pm/M-F for direct patient care and secure chat Please refer to Amion for contact info 22  days

## 2021-03-02 NOTE — Progress Notes (Signed)
Heart Failure Navigator Progress Note  Assessed for Heart & Vascular TOC clinic readiness.  Patient does not meet criteria due to ESRD on HD.   Navigator available for reassessment of patient.   Pricilla Holm, MSN, RN Heart Failure Nurse Navigator [phone]

## 2021-03-02 NOTE — Progress Notes (Signed)
CSW spoke with Network engineer at [facility] who states the patient's case worker, Wylene Men is currently out of the office and will return tomorrow. Secretary states the last note that was documented was on 02/24/2021 stating that case worker made contact with [employer] representative and additional information was provided at that time. CSW will return call to [facility] tomorrow at [phone], [phone]. [phone].  Madilyn Fireman, MSW, LCSW Transitions of Care  Clinical Social Worker II [phone]

## 2021-03-03 DIAGNOSIS — R569 Unspecified convulsions: Secondary | ICD-10-CM | POA: Diagnosis not present

## 2021-03-03 LAB — GLUCOSE, CAPILLARY
Glucose-Capillary: 158 mg/dL — ABNORMAL HIGH (ref 70–99)
Glucose-Capillary: 89 mg/dL (ref 70–99)
Glucose-Capillary: 139 mg/dL — ABNORMAL HIGH (ref 70–99)
Glucose-Capillary: 160 mg/dL — ABNORMAL HIGH (ref 70–99)
Glucose-Capillary: 183 mg/dL — ABNORMAL HIGH (ref 70–99)

## 2021-03-03 NOTE — Plan of Care (Signed)
  Problem: Health Behavior/Discharge Planning: Goal: Ability to manage health-related needs will improve Outcome: Progressing   

## 2021-03-03 NOTE — Plan of Care (Signed)
  Problem: Clinical Measurements: Goal: Will remain free from infection Outcome: Progressing   Problem: Nutrition: Goal: Adequate nutrition will be maintained Outcome: Progressing   Problem: Coping: Goal: Level of anxiety will decrease Outcome: Progressing

## 2021-03-03 NOTE — Progress Notes (Signed)
RD Sign Off Note   RD following up as pt is hospital day 23.   Admitting Dx: DKA (diabetic ketoacidosis) ([place]) [E11.10] Hyperglycemia [R73.9] ESRD (end stage renal disease) ([place]) [N18.6] Seizure-like activity ([place]) [R56.9] Acute hypoxemic respiratory failure ([place]) [J96.01] PMH:  Past Medical History:  Diagnosis Date   Diabetes mellitus    ESRD on hemodialysis ([place])    High cholesterol    Hypertension    Hypertrophic cardiomyopathy ([place]) 02/26/2021   Medications:  Scheduled Meds:  (feeding supplement) PROSource Plus  30 mL Oral BID BM   atorvastatin  40 mg Oral Daily   carvedilol  25 mg Oral BID WC   Chlorhexidine Gluconate Cloth  6 each Topical Q0600   darbepoetin (ARANESP) injection - DIALYSIS  60 mcg Intravenous Q Mon-HD   doxercalciferol  5 mcg Intravenous Q M,W,F-HD   heparin  5,000 Units Subcutaneous Q12H   imipramine  25 mg Oral QHS   insulin aspart  0-5 Units Subcutaneous QHS   insulin aspart  0-6 Units Subcutaneous TID WC   insulin glargine-yfgn  20 Units Subcutaneous QHS   labetalol  10 mg Intravenous Once   losartan  50 mg Oral QHS   multivitamin  1 tablet Oral QHS   sevelamer carbonate  1,600 mg Oral TID WC   sodium chloride flush  3 mL Intravenous Q12H  Continuous Infusions:  sodium chloride     promethazine (PHENERGAN) injection (IM or IVPB)     Labs: Recent Labs  Lab 02/27/21 0840 03/01/21 0117 03/02/21 0745  NA 130* 130* 129*  K 4.9 4.3 4.5  CL 94* 93* 93*  CO2 '23 25 22  '$ BUN 71* 62* 83*  CREATININE 9.97* 8.94* 10.82*  CALCIUM 8.7* 8.9 8.6*  PHOS 5.3* 5.3* 6.0*  GLUCOSE 178* 144* 88  CBGs 199-89-139  Wt Readings from Last 15 Encounters:  03/02/21 99.9 kg  11/26/20 88.3 kg  11/24/20 88 kg  10/28/20 93 kg  09/03/20 99 kg  08/13/20 104.8 kg  05/09/20 104.7 kg  02/25/20 106.6 kg  12/02/19 105.5 kg  11/18/15 79.4 kg  07/16/14 81.6 kg  01/21/14 79.1 kg   Body mass index is 29.87 kg/m. Patient meets criteria for overweight based on  current BMI.   Current diet order is renal/carb modified, patient is consuming approximately 100% of meals at this time. Pt has been offered numerous supplements throughout admission and has declined all supplements offered.  Per MD, pt is hemodynamically stable and continues to be ready for discharge. However, discharge has been difficult due to pt being legally blind, has DM and is on HD and his former care taker (his aunt) is no longer able to care for him and is refusing to allow him to return to her home. Given pt has been refusing supplements, will discontinue Prosource and RD will sign off as no further nutrition interventions warranted at this time. If nutrition issues arise, please re-consult RD.   Larkin Ina, MS, RD, LDN (she/her/hers) RD pager number and weekend/on-call pager number located in [place].

## 2021-03-03 NOTE — Progress Notes (Addendum)
[facility] PROGRESS NOTE  [name] [id number] DOB: 09-07-1985 DOA: 02/08/2021 PCP: Gildardo Pounds, NP  Status:  Remains inpatient appropriate because:Unsafe d/c plan  Dispo: The patient is from: Home              Anticipated d/c is to:  Unknown- pt disability pending and o/w has no income-may need to look for group home              Patient currently is medically stable to d/c.   Difficult to place patient Yes              Barriers to discharge: Legally blind, chronic       hemodialysis, diabetic requiring insulin and most importantly his aunt who he was living with prior to admission refuses to allow him to return    Level of care: Med-Surg  Code Status: Full Family Communication: Patient only DVT prophylaxis: Subcutaneous heparin COVID vaccination: Unknown  HPI: 35 year old male with history of ESRD on dialysis,IDDM on insulin, HTN, HLD, legally blind, presented with syncope versus seizure.  Blood glucose were severely elevated on presentation.  He was thought to have seizures from elevated blood glucose levels.  Found to have incidental positive for COVID, no respiratory issues.  He used to live with his aunt but she is unable to take him back home.    Subjective: Requested that I call [facility] regarding status of pending eye surgery [phone]) [phone].  Patient clarifies that he was referred to this [facility] due to the type of insurance he had but they were waiting on his Medicaid to become approved.  Patient now has active Medicaid. ([id number]).  Subsequently I have called the insurance department of this call office and have updated them on his Medicaid number.  Objective: Vitals:   03/02/21 2034 03/03/21 0542  BP: (!) 151/93 (!) 150/92  Pulse: 73 73  Resp: 18 18  Temp: 98.1 F (36.7 C) 98.8 F (37.1 C)  SpO2: 96% 100%    Intake/Output Summary (Last 24 hours) at 03/03/2021 0817 Last data filed at 03/03/2021 0200 Gross per 24 hour  Intake  717 ml  Output --  Net 717 ml    Filed Weights   02/27/21 0815 03/02/21 0600 03/02/21 0715  Weight: 99.7 kg 99.9 kg 99.9 kg    Exam:  Constitutional: Just completed giving self bath, no acute distress Respiratory: Posterior lung sounds clear to auscultation.  Stable on room air with normal pulse oximetry readings Cardiovascular: Normal heart sounds, trace peripheral edema, normotensive.  Has left upper extremity AVF Abdomen: Nontender, minimally distended, normoactive bowel sounds.  LBM 8/20 Neurologic: CN 2-12 grossly intact. Sensation intact, DStrength 5/5 x all 4 extremities.  Psychiatric: Normal judgment and insight. Alert and oriented x 3. Normal mood.    Assessment/Plan: Acute problems: Acute metabolic encephalopathy:  Resolved Initially thought to have seizure activity from hyperglycemia.   EEG with questionable dysfunction in the left temporal region.  MRI was negative Patient is legally blind.   Hyperglycemia/insulin-dependent diabetes type 2:  Ongoing diabetes greater than 10 years and this is likely the etiology to his renal failure CBGs well controlled with combination of medication and dialysis Continue Semglee 20 units HS with very sensitive SSI Hemoglobin A1c was 12.9 02/08/2021; this is up from 7.2 on 11/25/2020 and is likely representative of acute infectious process   Incidental COVID-positive:  Asymptomatic.  Initial chest x-ray was consistent with volume overload.  Received 1 dose of monoclonal  antibody on admission.   Off isolation   Acute otitis media of the right ear:  Confirmed with otoscopic examination by previous provider Continue p.o. Augmentin and ciprofloxacin eardrops with last dose is due 8/22   ESRD on dialysis:  Patient started on dialysis January 2022; progressed from CKD4  to CKD 5 at that time.  Has known horseshoe kidney Nephrology following   Acute systolic congestive heart failure/new dx Hypertrophic CM:  Echo obtained during this  hospitalization showed EF of 45% with global hypokinesis, moderate TR with associated elevated pulmonary artery pressure of 52 mmHg Echocardiogram in 2021 with EF 55 to 60%, moderate LVH and pulmonary artery pressure of 46 mmHg This is likely related to patient's underlying hypertensive heart disease but given change in EF with diffuse global hypokinesis we will ask cardiology to see during hospitalization Volume management as per dialysis Continue carvedilol and Cozaar Continue strict I/O and daily weights Cardiology consultation/Dr Skains rec: - Continue with carvedilol 25 mg twice a day- Not a candidate for Entresto given end-stage renal disease.  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  We could always check a PYP scan in the future.   -As outpatient would place a Zio patch monitor for 2 weeks looking for any adverse arrhythmias.  We can help facilitate.  Right toe ulcer:  He was treated with 14 days course of doxycycline ,follows with  podiatry.   Recommend outpatient follow-up.   Right middle toe also has healed very well   Hypertension:  Continue Apresoline, carvedilol, and Cozaar      Data Reviewed: Basic Metabolic Panel: Recent Labs  Lab 02/27/21 0840 03/01/21 0117 03/02/21 0745  NA 130* 130* 129*  K 4.9 4.3 4.5  CL 94* 93* 93*  CO2 '23 25 22  '$ GLUCOSE 178* 144* 88  BUN 71* 62* 83*  CREATININE 9.97* 8.94* 10.82*  CALCIUM 8.7* 8.9 8.6*  PHOS 5.3* 5.3* 6.0*   Liver Function Tests: Recent Labs  Lab 02/27/21 0840 03/01/21 0117 03/02/21 0745  ALBUMIN 3.1* 3.2* 3.3*   No results for input(s): LIPASE, AMYLASE in the last 168 hours. No results for input(s): AMMONIA in the last 168 hours. CBC: Recent Labs  Lab 02/27/21 0840 03/02/21 0745  WBC 5.1 4.9  HGB 9.2* 9.0*  HCT 29.5* 28.5*  MCV 82.9 82.1  PLT 234 200   Cardiac Enzymes: No  results for input(s): CKTOTAL, CKMB, CKMBINDEX, TROPONINI in the last 168 hours. BNP (last 3 results) Recent Labs    03/10/20 1410 08/06/20 2012 02/08/21 1905  BNP 617.0* 2,067.3* >4,500.0*    ProBNP (last 3 results) No results for input(s): PROBNP in the last 8760 hours.  CBG: Recent Labs  Lab 03/02/21 0636 03/02/21 1251 03/02/21 1625 03/02/21 2035 03/03/21 0636  GLUCAP 91 71 172* 199* 89    No results found for this or any previous visit (from the past 240 hour(s)).   Studies: No results found.  Scheduled Meds:  (feeding supplement) PROSource Plus  30 mL Oral BID BM   atorvastatin  40 mg Oral Daily   carvedilol  25 mg Oral BID WC   Chlorhexidine Gluconate Cloth  6 each Topical Q0600   darbepoetin (ARANESP) injection - DIALYSIS  60 mcg Intravenous Q Mon-HD   doxercalciferol  5 mcg Intravenous Q M,W,F-HD   heparin  5,000 Units Subcutaneous Q12H   imipramine  25 mg Oral QHS  insulin aspart  0-5 Units Subcutaneous QHS   insulin aspart  0-6 Units Subcutaneous TID WC   insulin glargine-yfgn  20 Units Subcutaneous QHS   labetalol  10 mg Intravenous Once   losartan  50 mg Oral QHS   multivitamin  1 tablet Oral QHS   sevelamer carbonate  1,600 mg Oral TID WC   sodium chloride flush  3 mL Intravenous Q12H   Continuous Infusions:  sodium chloride     promethazine (PHENERGAN) injection (IM or IVPB)      Principal Problem:   Seizure-like activity (HCC) Active Problems:   Essential hypertension   DM2 (diabetes mellitus, type 2) ([place])   Anemia of chronic kidney failure   Diabetic foot ulcer ([place])   Syncope   DKA (diabetic ketoacidosis) ([place])   Hypertrophic cardiomyopathy [facility])   Consultants: Nephrology Urology  Procedures: Echocardiogram  Antibiotics: Doxycycline 7/31 through 8/2 Doxycycline 8/5 through 8/13 Augmentin 8/15 >>   Time spent: 20 minutes    Erin Hearing ANP  [facility] 7 am - 330 pm/M-F for direct patient care and secure  chat Please refer to Amion for contact info 23  days

## 2021-03-03 NOTE — Progress Notes (Addendum)
Subjective: Woken from sleep no complaints said tolerated dialysis yesterday, requesting tomorrow 3-1/2 to 4 L UF  Objective Vital signs in last 24 hours: Vitals:   03/02/21 1809 03/02/21 2034 03/03/21 0542 03/03/21 1001  BP: (!) 169/97 (!) 151/93 (!) 150/92 136/71  Pulse: 77 73 73 71  Resp: '16 18 18 19  '$ Temp: 98.8 F (37.1 C) 98.1 F (36.7 C) 98.8 F (37.1 C) 98.1 F (36.7 C)  TempSrc: Oral  Oral Oral  SpO2: 96% 96% 100% 100%  Weight:      Height:       Weight change: 0 kg  Physical Exam: General: Adult male more interactive today NAD  Heart: RRR, no MRG Lungs: CTA nonlabored breathing Abdomen: NABS, soft NTND, some abdominal wall edema Extremities: No pedal edema Dialysis Access: Positive bruit left arm AV fistula   OP HD: SW MWF  Dialysis Orders: [facility] MWF 4 hrs 180NRe 450/800 86.5 kg 2.0 K/2.0 Ca L AVF -Heparin-none -[name] 5 mcg IV TIW -Venofer 100 mcg IV X 10 doses 3/7 doses given last dose 02/04/21 -Mircera 225 mcg IV q 2 weeks (last dose 02/02/2021)     Problem/Plan: Volume overload -5 L UF last HD 8/22 no post weight noticed, pt quite vol overloaded by wts and has some abdominal wall edema, needs standing wts .  needs strict fluid restriction.  Last week moved to renal floor for better dietary discipline. Max UF as tolerated to get volume down. Still very much above OP EDW however not in distress, he thinks he may have some weight gain eating better in hospital Hypertrophic CM-new Dx this Admit. Avoid tachycardia. Per primary Acute systolic HF-EF down to 123XX123 this admission. Manage volume with HD.  ESRD - on HD MWF.  HD tomorrow on schedule HHS/DMT2: sp IV insulin on SQ insulin now AMS: resolved, thought to be related to elevated glucose +/- seizure. CT of head without acute changes/MRI neg as well. Neuro involved no anti sz meds req COVID 19- Pt is now off COVID isolation.  HTN:  Cont home meds. On Coreg 25 bid, Cozaar 50. UF with HD and continue to lower  volume as tolerated.  Follow-up BP trend with UF on dialysis before raising BP meds Anemia  - Hgb 9.0 . Aranesp 60 q week starting 8/15.  Metabolic bone disease -  Ca ok.  Phosphorus 6.0 recently increased Renvela to 2 tabs PO TID AC. Continue VDRA.  R toe ulcer  -Completed doxycycline  Nutrition -Renal Carb mod diet, on protein supplements, renal vits. Disposition -  Unclear. Prev living with aunt but unable to care for him. SW/CM involved, no plan yet     Ernest Haber, PA-C [facility] [phone] 03/03/2021,11:44 AM  LOS: 23 days   Labs: Basic Metabolic Panel: Recent Labs  Lab 02/27/21 0840 03/01/21 0117 03/02/21 0745  NA 130* 130* 129*  K 4.9 4.3 4.5  CL 94* 93* 93*  CO2 '23 25 22  '$ GLUCOSE 178* 144* 88  BUN 71* 62* 83*  CREATININE 9.97* 8.94* 10.82*  CALCIUM 8.7* 8.9 8.6*  PHOS 5.3* 5.3* 6.0*   Liver Function Tests: Recent Labs  Lab 02/27/21 0840 03/01/21 0117 03/02/21 0745  ALBUMIN 3.1* 3.2* 3.3*   No results for input(s): LIPASE, AMYLASE in the last 168 hours. No results for input(s): AMMONIA in the last 168 hours. CBC: Recent Labs  Lab 02/27/21 0840 03/02/21 0745  WBC 5.1 4.9  HGB 9.2* 9.0*  HCT 29.5* 28.5*  MCV  82.9 82.1  PLT 234 200   Cardiac Enzymes: No results for input(s): CKTOTAL, CKMB, CKMBINDEX, TROPONINI in the last 168 hours. CBG: Recent Labs  Lab 03/02/21 0636 03/02/21 1251 03/02/21 1625 03/02/21 2035 03/03/21 0636  GLUCAP 91 71 172* 199* 89    Studies/Results: No results found. Medications:  sodium chloride     promethazine (PHENERGAN) injection (IM or IVPB)      (feeding supplement) PROSource Plus  30 mL Oral BID BM   atorvastatin  40 mg Oral Daily   carvedilol  25 mg Oral BID WC   Chlorhexidine Gluconate Cloth  6 each Topical Q0600   darbepoetin (ARANESP) injection - DIALYSIS  60 mcg Intravenous Q Mon-HD   doxercalciferol  5 mcg Intravenous Q M,W,F-HD   heparin  5,000 Units Subcutaneous Q12H    imipramine  25 mg Oral QHS   insulin aspart  0-5 Units Subcutaneous QHS   insulin aspart  0-6 Units Subcutaneous TID WC   insulin glargine-yfgn  20 Units Subcutaneous QHS   labetalol  10 mg Intravenous Once   losartan  50 mg Oral QHS   multivitamin  1 tablet Oral QHS   sevelamer carbonate  1,600 mg Oral TID WC   sodium chloride flush  3 mL Intravenous Q12H

## 2021-03-04 DIAGNOSIS — R569 Unspecified convulsions: Secondary | ICD-10-CM | POA: Diagnosis not present

## 2021-03-04 LAB — RENAL FUNCTION PANEL
Albumin: 3.3 g/dL — ABNORMAL LOW (ref 3.5–5.0)
Anion gap: 13 (ref 5–15)
BUN: 81 mg/dL — ABNORMAL HIGH (ref 6–20)
CO2: 23 mmol/L (ref 22–32)
Calcium: 8.4 mg/dL — ABNORMAL LOW (ref 8.9–10.3)
Chloride: 94 mmol/L — ABNORMAL LOW (ref 98–111)
Creatinine, Ser: 10.51 mg/dL — ABNORMAL HIGH (ref 0.61–1.24)
GFR, Estimated: 6 mL/min — ABNORMAL LOW (ref >60)
Glucose, Bld: 131 mg/dL — ABNORMAL HIGH (ref 70–99)
Phosphorus: 5.8 mg/dL — ABNORMAL HIGH (ref 2.5–4.6)
Potassium: 5 mmol/L (ref 3.5–5.1)
Sodium: 130 mmol/L — ABNORMAL LOW (ref 135–145)

## 2021-03-04 LAB — CBC
HCT: 29.3 % — ABNORMAL LOW (ref 39.0–52.0)
Hemoglobin: 9.1 g/dL — ABNORMAL LOW (ref 13.0–17.0)
MCH: 25.6 pg — ABNORMAL LOW (ref 26.0–34.0)
MCHC: 31.1 g/dL (ref 30.0–36.0)
MCV: 82.5 fL (ref 80.0–100.0)
Platelets: 193 10*3/uL (ref 150–400)
RBC: 3.55 MIL/uL — ABNORMAL LOW (ref 4.22–5.81)
RDW: 18 % — ABNORMAL HIGH (ref 11.5–15.5)
WBC: 4.9 10*3/uL (ref 4.0–10.5)
nRBC: 0 % (ref 0.0–0.2)

## 2021-03-04 LAB — GLUCOSE, CAPILLARY
Glucose-Capillary: 94 mg/dL (ref 70–99)
Glucose-Capillary: 136 mg/dL — ABNORMAL HIGH (ref 70–99)
Glucose-Capillary: 220 mg/dL — ABNORMAL HIGH (ref 70–99)

## 2021-03-04 MED ORDER — DOXERCALCIFEROL 4 MCG/2ML IV SOLN
INTRAVENOUS | Status: AC
  Administered 2021-03-04: 5 ug via INTRAVENOUS
  Filled 2021-03-04: qty 4

## 2021-03-04 NOTE — Progress Notes (Signed)
Occupational Therapy Evaluation Patient Details Name: [name] MRN: [id number] DOB: [birth year] Today's Date: 03/04/2021    History of Present Illness Pt is a 35yo male presenting to [facility] recent admission on 7/31 with syncopal episode and seizure. Found to be in acute hypoxemic respiratory failure and diabetic ketoacidosis. Of note, pt was +COVID upon admission. Imaging negative for acute findings. EEG noted cortical dysfunction  in left temporal region. PMH: ESRD on HD MWF, DM1, HTN, legally blind, active wound on right toes.   Clinical Impression   Pt received with above diagnosis and concerns. PTA pt PLOF reports being mostly independent and living at home with family, reports receiving public transportation for doctors visits. Pt currently requires no physical assistance for ADL engagement, however, requires cueing for safety with functional transfers/mobility for navigation and orientation of items to safely perform tasks. Pt will benefit with continued skilled level OT for education in adaptive devices and compensatory strategies to maximize independence with ADLs prior to returning to home setting. RN given "Services for the blind" pamphlet to be given to patient/family members for decreased vision resources. DC recommendation to SNF.     SNF    Equipment Recommendations  None recommended by OT    Recommendations for Other Services       Precautions / Restrictions Precautions Precautions: Fall Precaution Comments: recent syncope; legally blind (reports he can see shadows and light)      Mobility Bed Mobility               General bed mobility comments: received sitting EOB upon arrival.    Transfers Overall transfer level: Independent Equipment used: None             General transfer comment: Pt agreeable to transfer to bathroom and to sink to assess sequencing and safety with navigation. Pt does not required equipment, however, cueing for safety to prevent  trips and falls and orientation of items for mobility and grooming task.    Balance Overall balance assessment: No apparent balance deficits (not formally assessed)                                         ADL either performed or assessed with clinical judgement   ADL Overall ADL's : At baseline                                       General ADL Comments: No physical assistance required to don socks and shirt to prepare functional transfer to toilet with bathroom and standing at sink for washing hands. Pt requires cueing for safety with navigation, Pt reports concerns for administering insulin and IADLs prior to DC. OT given RN pamplet for services for the blind for resources once dc'd from SNF.     Vision Baseline Vision/History: 2 Legally blind Patient Visual Report: Other (comment) (can see shadows) Vision Assessment?: Vision impaired- to be further tested in functional context     Perception     Praxis      Pertinent Vitals/Pain Pain Assessment: No/denies pain     Hand Dominance Right   Extremity/Trunk Assessment Upper Extremity Assessment Upper Extremity Assessment: Overall WFL for tasks assessed   Lower Extremity Assessment Lower Extremity Assessment: Defer to PT evaluation   Cervical / Trunk Assessment Cervical / Trunk Assessment:  Normal   Communication Communication Communication: No difficulties   Cognition Arousal/Alertness: Awake/alert Behavior During Therapy: WFL for tasks assessed/performed Overall Cognitive Status: Within Functional Limits for tasks assessed                                 General Comments: Pt very flat affect and max encouragement to walk to bathroom and sink to assess functional mobility and function with ADLs.   General Comments       Exercises     Shoulder Instructions      Home Living Family/patient expects to be discharged to:: Unsure Living Arrangements: Other relatives  (aunt) Available Help at Discharge: Family;Available 24 hours/day Type of Home: House Home Access: Level entry     Home Layout: One level     Bathroom Shower/Tub: Teacher, early years/pre: Standard                Prior Functioning/Environment Level of Independence: Independent        Comments: Transportation to medical appointments like dialysis, pt states he receives no assistance getting inside the doctor's office and reports he does not have any trouble        OT Problem List: Impaired vision/perception      OT Treatment/Interventions: Visual/perceptual remediation/compensation    OT Goals(Current goals can be found in the care plan section) Acute Rehab OT Goals Patient Stated Goal: to go home OT Goal Formulation: With patient Time For Goal Achievement: 03/18/21 Potential to Achieve Goals: Fair  OT Frequency: Min 1X/week   Barriers to D/C: Inaccessible home environment          Co-evaluation              AM-PAC OT "6 Clicks" Daily Activity     Outcome Measure Help from another person eating meals?: None Help from another person taking care of personal grooming?: None Help from another person toileting, which includes using toliet, bedpan, or urinal?: None Help from another person bathing (including washing, rinsing, drying)?: None Help from another person to put on and taking off regular upper body clothing?: None Help from another person to put on and taking off regular lower body clothing?: None 6 Click Score: 24   End of Session Equipment Utilized During Treatment: Gait belt Nurse Communication: Mobility status  Activity Tolerance: Patient tolerated treatment well Patient left: in bed;with call bell/phone within reach;with nursing/sitter in room  OT Visit Diagnosis: Low vision, both eyes (H54.2)                Time: [id number] OT Time Calculation (min): 17 min Charges:  OT General Charges $OT Visit: 1 Visit OT Evaluation $OT  Eval Low Complexity: [place], MSOT, OTR/L  Supplemental Rehabilitation Services  [phone]   Marius Ditch 03/04/2021, 11:10 AM

## 2021-03-04 NOTE — Progress Notes (Signed)
Subjective: Sitting on side of bed, no complaints awaiting dialysis  Objective Vital signs in last 24 hours: Vitals:   03/03/21 2112 03/04/21 0609 03/04/21 0851 03/04/21 1212  BP: (!) 146/79 (!) 163/97 (!) 173/114 (!) 175/112  Pulse: 72 73 69   Resp: '18 18 17   '$ Temp: 98.4 F (36.9 C) 98.7 F (37.1 C) 98.2 F (36.8 C)   TempSrc:      SpO2: 98% 93% 100%   Weight: 99.9 kg 98 kg    Height:       Weight change: 0 kg  Physical Exam: General: Adult male NAD, interactive today Heart: RRR, no MRG Lungs: CTA nonlabored breathing Abdomen: NABS, soft NTND, some abdominal wall edema Extremities: No pedal edema Dialysis Access: Positive bruit left arm AV fistula   OP HD: SW MWF  Dialysis Orders: [facility] MWF 4 hrs 180NRe 450/800 86.5 kg 2.0 K/2.0 Ca L AVF -Heparin-none -[name] 5 mcg IV TIW -Venofer 100 mcg IV X 10 doses 3/7 doses given last dose 02/04/21 -Mircera 225 mcg IV q 2 weeks (last dose 02/02/2021)     Problem/Plan: Volume overload -5 L UF last HD 8/22 no post weight noticed, pt quite vol overloaded by wts and has some abdominal wall edema, needs standing wts .  needs strict fluid restriction.  Last week moved to renal floor for better dietary discipline. Max UF as tolerated to get volume down. Still very much above OP EDW however not in distress, he thinks he may have some weight gain eating better in hospital Hypertrophic CM-new Dx this Admit.  Acute systolic HF-EF down to 123XX123 this admission. Manage volume with HD.  ESRD - on HD MWF.  on schedule HHS/DMT2: sp IV insulin on SQ insulin now AMS: resolved, thought to be related to elevated glucose +/- seizure. CT of head without acute changes/MRI neg as well. Neuro involved no anti sz meds req COVID 19- Pt is now off COVID isolation.  HTN:  Cont home meds. On Coreg 25 bid, Cozaar 50. UF with HD and continue to lower volume as tolerated.  Follow-up BP trend with UF on dialysis before raising BP meds Anemia  - Hgb 9.0 . Aranesp  60 q week starting 8/15.  Metabolic bone disease -  Ca ok.  Phosphorus 6.0 recently increased Renvela to 2 tabs PO TID AC. Continue VDRA.  R toe ulcer  -Completed doxycycline  Nutrition -Renal Carb mod diet, on protein supplements, renal vits. Disposition -  Unclear. Prev living with aunt but unable to care for him.   Ernest Haber, PA-C [facility]per [phone] 03/04/2021,1:46 PM  LOS: 24 days   Labs: Basic Metabolic Panel: Recent Labs  Lab 02/27/21 0840 03/01/21 0117 03/02/21 0745  NA 130* 130* 129*  K 4.9 4.3 4.5  CL 94* 93* 93*  CO2 '23 25 22  '$ GLUCOSE 178* 144* 88  BUN 71* 62* 83*  CREATININE 9.97* 8.94* 10.82*  CALCIUM 8.7* 8.9 8.6*  PHOS 5.3* 5.3* 6.0*   Liver Function Tests: Recent Labs  Lab 02/27/21 0840 03/01/21 0117 03/02/21 0745  ALBUMIN 3.1* 3.2* 3.3*   No results for input(s): LIPASE, AMYLASE in the last 168 hours. No results for input(s): AMMONIA in the last 168 hours. CBC: Recent Labs  Lab 02/27/21 0840 03/02/21 0745  WBC 5.1 4.9  HGB 9.2* 9.0*  HCT 29.5* 28.5*  MCV 82.9 82.1  PLT 234 200   Cardiac Enzymes: No results for input(s): CKTOTAL, CKMB, CKMBINDEX, TROPONINI in the last  168 hours. CBG: Recent Labs  Lab 03/03/21 1216 03/03/21 1645 03/03/21 2112 03/04/21 0611 03/04/21 1105  GLUCAP 139* 160* 183* 94 136*    Studies/Results: No results found. Medications:  sodium chloride     promethazine (PHENERGAN) injection (IM or IVPB)      atorvastatin  40 mg Oral Daily   carvedilol  25 mg Oral BID WC   Chlorhexidine Gluconate Cloth  6 each Topical Q0600   darbepoetin (ARANESP) injection - DIALYSIS  60 mcg Intravenous Q Mon-HD   doxercalciferol  5 mcg Intravenous Q M,W,F-HD   heparin  5,000 Units Subcutaneous Q12H   imipramine  25 mg Oral QHS   insulin aspart  0-5 Units Subcutaneous QHS   insulin aspart  0-6 Units Subcutaneous TID WC   insulin glargine-yfgn  20 Units Subcutaneous QHS   labetalol  10 mg Intravenous  Once   losartan  50 mg Oral QHS   multivitamin  1 tablet Oral QHS   sevelamer carbonate  1,600 mg Oral TID WC   sodium chloride flush  3 mL Intravenous Q12H

## 2021-03-04 NOTE — NC FL2 (Signed)
[place] LEVEL OF CARE SCREENING TOOL     IDENTIFICATION  Patient Name: [name] Birthdate: 05-Oct-1985 Sex: male Admission Date (Current Location): 02/08/2021  [place] and [place] Number:  Kathleen Argue [id number] Facility and Address:  [facility] [place], [place] [address] [place], [place] [address]      Provider Number: [id number]  Attending Physician Name and Address:  Charlynne Cousins, MD  Relative Name and Phone Number:       Current Level of Care: Hospital Recommended Level of Care: [place] Prior Approval Number:    Date Approved/Denied: 03/04/21 PASRR Number: [id number]  Discharge Plan: SNF    Current Diagnoses: Patient Active Problem List   Diagnosis Date Noted   Hypertrophic cardiomyopathy ([place]) 02/26/2021   Syncope 02/08/2021   DKA (diabetic ketoacidosis) ([place]) 02/08/2021   Seizure-like activity ([place])    Hyperglycemia    Acute hypoxemic respiratory failure ([place]) 11/25/2020   Diabetic foot ulcer ([place]) 11/24/2020   Hypervolemia associated with renal insufficiency 08/07/2020   Uremia 123XX123   Metabolic acidosis 123XX123   Hyperkalemia 08/07/2020   Nausea and vomiting 05/07/2020   Anemia of chronic kidney failure 05/07/2020   Dehydration    Hypertensive urgency 02/25/2020   Acute renal failure superimposed on stage 4 chronic kidney disease ([place]) 12/02/2019   Anemia 12/02/2019   Essential hypertension 12/02/2019   SOB (shortness of breath) 12/02/2019   DM2 (diabetes mellitus, type 2) ([place]) 12/02/2019   Diarrhea 03/23/2017   Early satiety 03/23/2017   Generalized abdominal pain 03/23/2017    Orientation RESPIRATION BLADDER Height & Weight     Self, Time, Situation, Place  Normal Continent Weight: 216 lb 0.8 oz (98 kg) Height:  6' (182.9 cm)  BEHAVIORAL SYMPTOMS/MOOD NEUROLOGICAL BOWEL NUTRITION STATUS      Continent Diet  AMBULATORY STATUS COMMUNICATION OF NEEDS Skin   Independent Verbally  Normal                       Personal Care Assistance Level of Assistance  Bathing, Feeding, Dressing Bathing Assistance: Independent Feeding assistance: Independent Dressing Assistance: Independent     Functional Limitations Info  Sight, Hearing, Speech Sight Info: Impaired (Patient legally blind) Hearing Info: Adequate Speech Info: Adequate    SPECIAL CARE FACTORS FREQUENCY                       Contractures Contractures Info: Not present    Additional Factors Info  Allergies, Code Status Code Status Info: Full Allergies Info: Amlodipine, Hydralazine           Current Medications (03/04/2021):  This is the current hospital active medication list Current Facility-Administered Medications  Medication Dose Route Frequency Provider Last Rate Last Admin   0.9 %  sodium chloride infusion  250 mL Intravenous PRN Wynetta Fines T, MD       acetaminophen (TYLENOL) tablet 650 mg  650 mg Oral Q4H PRN Wynetta Fines T, MD       atorvastatin (LIPITOR) tablet 40 mg  40 mg Oral Daily Wynetta Fines T, MD   40 mg at 03/03/21 0857   butalbital-acetaminophen-caffeine (FIORICET) 50-325-40 MG per tablet 1 tablet  1 tablet Oral Q6H PRN Lequita Halt, MD   1 tablet at 02/09/21 0653   carvedilol (COREG) tablet 25 mg  25 mg Oral BID WC Pokhrel, Laxman, MD   25 mg at 03/03/21 1817   Chlorhexidine Gluconate Cloth 2 % PADS  6 each  6 each Topical Q0600 Corliss Parish, MD   6 each at 02/28/21 0908   Darbepoetin Alfa (ARANESP) injection 60 mcg  60 mcg Intravenous Q Mon-HD Donnamae Jude, RPH   60 mcg at 03/02/21 1048   dextrose 50 % solution 0-50 mL  0-50 mL Intravenous PRN Petrucelli, Samantha R, PA-C       doxercalciferol ([name]) injection 5 mcg  5 mcg Intravenous Q M,W,F-HD Shelly Coss, MD   5 mcg at 03/02/21 1048   heparin injection 5,000 Units  5,000 Units Subcutaneous Q12H Wynetta Fines T, MD   5,000 Units at 03/03/21 2151   hydrALAZINE (APRESOLINE) injection 10 mg  10 mg  Intravenous Q6H PRN Irene Pap N, DO   10 mg at 03/01/21 [id number]   hydrALAZINE (APRESOLINE) tablet 25 mg  25 mg Oral Q6H PRN Samella Parr, NP   25 mg at 03/02/21 1310   hydrOXYzine (ATARAX/VISTARIL) tablet 25 mg  25 mg Oral TID PRN Wynetta Fines T, MD   25 mg at 02/12/21 2324   imipramine (TOFRANIL) tablet 25 mg  25 mg Oral QHS Wynetta Fines T, MD   25 mg at 02/28/21 2046   insulin aspart (novoLOG) injection 0-5 Units  0-5 Units Subcutaneous QHS Irene Pap N, DO   2 Units at 02/27/21 2148   insulin aspart (novoLOG) injection 0-6 Units  0-6 Units Subcutaneous TID WC Irene Pap N, DO   1 Units at 03/03/21 1817   insulin glargine-yfgn (SEMGLEE) injection 20 Units  20 Units Subcutaneous QHS Shelly Coss, MD   20 Units at 03/03/21 2151   labetalol (NORMODYNE) injection 10 mg  10 mg Intravenous Once Zierle-Ghosh, Asia B, DO       losartan (COZAAR) tablet 50 mg  50 mg Oral QHS Corliss Parish, MD   50 mg at 03/03/21 2151   multivitamin (RENA-VIT) tablet 1 tablet  1 tablet Oral QHS Wynetta Fines T, MD   1 tablet at 03/03/21 2151   promethazine (PHENERGAN) 25 mg in sodium chloride 0.9 % 50 mL IVPB  25 mg Intravenous Q6H PRN Wynetta Fines T, MD       sevelamer carbonate (RENVELA) tablet 1,600 mg  1,600 mg Oral TID WC Wynetta Fines T, MD   1,600 mg at 03/03/21 1818   sodium chloride flush (NS) 0.9 % injection 3 mL  3 mL Intravenous Q12H Wynetta Fines T, MD   3 mL at 03/01/21 2156   sodium chloride flush (NS) 0.9 % injection 3 mL  3 mL Intravenous PRN Wynetta Fines T, MD   3 mL at 02/27/21 1353     Discharge Medications: Please see discharge summary for a list of discharge medications.  Relevant Imaging Results:  Relevant Lab Results:   Additional Information SSN: [id number] - patient is MWF Dialysis  Archie Endo, LCSW

## 2021-03-04 NOTE — Progress Notes (Signed)
[facility] PROGRESS NOTE  ESIAS MCTIER [id number] DOB: March 17, 1986 DOA: 02/08/2021 PCP: Gildardo Pounds, NP  Status:  Remains inpatient appropriate because:Unsafe d/c plan  Dispo: The patient is from: Home              Anticipated d/c is to:  Unknown- pt disability pending and o/w has no income-may need to look for group home              Patient currently is medically stable to d/c.   Difficult to place patient Yes              Barriers to discharge: Legally blind, chronic    hemodialysis, diabetic requiring insulin and most importantly his aunt who he was living with prior to admission refuses to allow him to return    Level of care: Med-Surg  Code Status: Full Family Communication: Patient only DVT prophylaxis: Subcutaneous heparin COVID vaccination: Unknown  HPI: 35 year old male with history of ESRD on dialysis,IDDM on insulin, HTN, HLD, legally blind, presented with syncope versus seizure.  Blood glucose were severely elevated on presentation.  He was thought to have seizures from elevated blood glucose levels.  Found to have incidental positive for COVID, no respiratory issues.  He used to live with his aunt but she is unable to take him back home.    Subjective: Alert.  Standing at sink performing ADLs with OT assisting.  Objective: Vitals:   03/03/21 2112 03/04/21 0609  BP: (!) 146/79 (!) 163/97  Pulse: 72 73  Resp: 18 18  Temp: 98.4 F (36.9 C) 98.7 F (37.1 C)  SpO2: 98% 93%    Intake/Output Summary (Last 24 hours) at 03/04/2021 0803 Last data filed at 03/03/2021 2112 Gross per 24 hour  Intake 880 ml  Output 0 ml  Net 880 ml    Filed Weights   03/02/21 0715 03/03/21 2112 03/04/21 0609  Weight: 99.9 kg 99.9 kg 98 kg    Exam:  Constitutional: No acute distress, calm Respiratory: Lung sounds remain clear, stable on room air, no shortness of breath with activity observed Cardiovascular: S1-S2, no JVD or peripheral edema, normotensive.  Has  left upper extremity AVF Abdomen: Nontender, minimally distended, normoactive bowel sounds.  Eating well.  LBM 8/22 Neurologic: CN 2-12 grossly intact. Sensation intact, DStrength 5/5 x all 4 extremities.  Psychiatric: Normal judgment and insight. Alert and oriented x 3. Normal mood.    Assessment/Plan: Acute problems: Acute metabolic encephalopathy:  Resolved Initially thought to have seizure activity from hyperglycemia.   EEG with questionable dysfunction in the left temporal region.  MRI was negative  Legally blind status Patient can basically see shadows and changes in light but otherwise has extremely restricted vision Given patient is diabetic and on hemodialysis in context of underlying legally blind status preferential discharge plan will be to discharge to group home or short-term SNF until can obtain group home placement.  Currently patient does not have disability noting the application is pending and because of this we are unable to start the group home certification process.   Hyperglycemia/insulin-dependent diabetes type 2:  Ongoing diabetes greater than 10 years and this is likely the etiology to his renal failure CBGs well controlled with combination of medication and dialysis Continue Semglee 20 units HS with very sensitive SSI Hemoglobin A1c was 12.9 02/08/2021; this is up from 7.2 on 11/25/2020 and is likely representative of acute infectious process   Incidental COVID-positive:  Asymptomatic.  Initial chest x-ray was consistent  with volume overload.  Received 1 dose of monoclonal antibody on admission.   Off isolation   Acute otitis media of the right ear:  Confirmed with otoscopic examination by previous provider Continue p.o. Augmentin and ciprofloxacin eardrops with last dose is due 8/22   ESRD on dialysis:  Patient started on dialysis January 2022; progressed from CKD4  to CKD 5 at that time.  Has known horseshoe kidney Nephrology following If unable to remain  in the [facility] area will need to locate dialysis center before discharge   Acute systolic congestive heart failure/new dx Hypertrophic CM:  Echo obtained during this hospitalization showed EF of 45% with global hypokinesis, moderate TR with associated elevated pulmonary artery pressure of 52 mmHg Echocardiogram in 2021 with EF 55 to 60%, moderate LVH and pulmonary artery pressure of 46 mmHg This is likely related to patient's underlying hypertensive heart disease but given change in EF with diffuse global hypokinesis we will ask cardiology to see during hospitalization Volume management as per dialysis Continue carvedilol and Cozaar Continue strict I/O and daily weights Cardiology consultation/Dr Skains rec: - Continue with carvedilol 25 mg twice a day- Not a candidate for Entresto given end-stage renal disease.  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  We could always check a PYP scan in the future.   -As outpatient would place a Zio patch monitor for 2 weeks looking for any adverse arrhythmias.  We can help facilitate.  Right toe ulcer:  He was treated with 14 days course of doxycycline ,follows with  podiatry.   Recommend outpatient follow-up.   Right middle toe also has healed very well   Hypertension:  Continue Apresoline, carvedilol, and Cozaar      Data Reviewed: Basic Metabolic Panel: Recent Labs  Lab 02/27/21 0840 03/01/21 0117 03/02/21 0745  NA 130* 130* 129*  K 4.9 4.3 4.5  CL 94* 93* 93*  CO2 '23 25 22  '$ GLUCOSE 178* 144* 88  BUN 71* 62* 83*  CREATININE 9.97* 8.94* 10.82*  CALCIUM 8.7* 8.9 8.6*  PHOS 5.3* 5.3* 6.0*   Liver Function Tests: Recent Labs  Lab 02/27/21 0840 03/01/21 0117 03/02/21 0745  ALBUMIN 3.1* 3.2* 3.3*   No results for input(s): LIPASE, AMYLASE in the last 168 hours. No results for input(s): AMMONIA in the last  168 hours. CBC: Recent Labs  Lab 02/27/21 0840 03/02/21 0745  WBC 5.1 4.9  HGB 9.2* 9.0*  HCT 29.5* 28.5*  MCV 82.9 82.1  PLT 234 200   Cardiac Enzymes: No results for input(s): CKTOTAL, CKMB, CKMBINDEX, TROPONINI in the last 168 hours. BNP (last 3 results) Recent Labs    03/10/20 1410 08/06/20 2012 02/08/21 1905  BNP 617.0* 2,067.3* >4,500.0*    ProBNP (last 3 results) No results for input(s): PROBNP in the last 8760 hours.  CBG: Recent Labs  Lab 03/03/21 0636 03/03/21 1216 03/03/21 1645 03/03/21 2112 03/04/21 0611  GLUCAP 89 139* 160* 183* 94    No results found for this or any previous visit (from the past 240 hour(s)).   Studies: No results found.  Scheduled Meds:  atorvastatin  40 mg Oral Daily   carvedilol  25 mg Oral BID WC   Chlorhexidine Gluconate Cloth  6 each Topical Q0600   darbepoetin (ARANESP) injection - DIALYSIS  60 mcg Intravenous Q Mon-HD   doxercalciferol  5 mcg Intravenous Q M,W,F-HD   heparin  5,000 Units Subcutaneous Q12H   imipramine  25 mg Oral QHS   insulin aspart  0-5 Units Subcutaneous QHS   insulin aspart  0-6 Units Subcutaneous TID WC   insulin glargine-yfgn  20 Units Subcutaneous QHS   labetalol  10 mg Intravenous Once   losartan  50 mg Oral QHS   multivitamin  1 tablet Oral QHS   sevelamer carbonate  1,600 mg Oral TID WC   sodium chloride flush  3 mL Intravenous Q12H   Continuous Infusions:  sodium chloride     promethazine (PHENERGAN) injection (IM or IVPB)      Principal Problem:   Seizure-like activity (HCC) Active Problems:   Essential hypertension   DM2 (diabetes mellitus, type 2) (HCC)   Anemia of chronic kidney failure   Diabetic foot ulcer ([place])   Syncope   DKA (diabetic ketoacidosis) ([place])   Hypertrophic cardiomyopathy [facility])   Consultants: Nephrology Urology  Procedures: Echocardiogram  Antibiotics: Doxycycline 7/31 through 8/2 Doxycycline 8/5 through 8/13 Augmentin 8/15 >>   Time spent:  20 minutes    Erin Hearing ANP  [facility] 7 am - 330 pm/M-F for direct patient care and secure chat Please refer to Amion for contact info 24  days

## 2021-03-04 NOTE — Progress Notes (Addendum)
2:30pm: CSW received bed offer from [place] spoke with Shirlee Limerick at [facility] to confirm the bed offer. Shirlee Limerick will initiate insurance authorization.  10:15am: CSW spoke with [place] who states there is one additional form that needs to be completed by the patient - [place] to fax it to [place] and CSW will assist patient in completing form.  8am: CSW completed FL2 and faxed patient's clinical information out for review.  Madilyn Fireman, MSW, LCSW Transitions of Care  Clinical Social Worker II [phone]

## 2021-03-05 DIAGNOSIS — E1110 Type 2 diabetes mellitus with ketoacidosis without coma: Secondary | ICD-10-CM | POA: Diagnosis not present

## 2021-03-05 DIAGNOSIS — R569 Unspecified convulsions: Secondary | ICD-10-CM | POA: Diagnosis not present

## 2021-03-05 DIAGNOSIS — N185 Chronic kidney disease, stage 5: Secondary | ICD-10-CM | POA: Diagnosis not present

## 2021-03-05 DIAGNOSIS — J9601 Acute respiratory failure with hypoxia: Secondary | ICD-10-CM | POA: Diagnosis not present

## 2021-03-05 LAB — GLUCOSE, CAPILLARY
Glucose-Capillary: 53 mg/dL — ABNORMAL LOW (ref 70–99)
Glucose-Capillary: 75 mg/dL (ref 70–99)
Glucose-Capillary: 92 mg/dL (ref 70–99)

## 2021-03-05 MED ORDER — INSULIN ASPART 100 UNIT/ML IJ SOLN: 3.0000 [IU] | Freq: Three times a day (TID) | INTRAMUSCULAR | 11 refills | Status: DC

## 2021-03-05 MED ORDER — ACETAMINOPHEN 325 MG PO TABS: 650.0000 mg | ORAL_TABLET | ORAL | Status: DC | PRN

## 2021-03-05 MED ORDER — INSULIN ASPART 100 UNIT/ML IJ SOLN: 0.0000 [IU] | Freq: Every day | INTRAMUSCULAR | 11 refills | Status: DC

## 2021-03-05 MED ORDER — IMIPRAMINE HCL 25 MG PO TABS
25.0000 mg | ORAL_TABLET | Freq: Every day | ORAL | 3 refills | Status: DC
Start: 2021-03-05 — End: 2021-12-30

## 2021-03-05 MED ORDER — INSULIN GLARGINE-YFGN 100 UNIT/ML ~~LOC~~ SOLN: 16.0000 [IU] | Freq: Every day | SUBCUTANEOUS | 11 refills | Status: DC

## 2021-03-05 MED ORDER — CARVEDILOL 25 MG PO TABS: 25.0000 mg | ORAL_TABLET | Freq: Two times a day (BID) | ORAL | Status: DC

## 2021-03-05 MED ORDER — DOXERCALCIFEROL 4 MCG/2ML IV SOLN
5.0000 ug | INTRAVENOUS | Status: DC
Start: 2021-03-06 — End: 2021-12-30

## 2021-03-05 MED ORDER — INSULIN ASPART 100 UNIT/ML IJ SOLN
3.0000 [IU] | Freq: Three times a day (TID) | INTRAMUSCULAR | Status: DC
  Administered 2021-03-05: 3 [IU] via SUBCUTANEOUS

## 2021-03-05 MED ORDER — LOSARTAN POTASSIUM 50 MG PO TABS: 50.0000 mg | ORAL_TABLET | Freq: Every day | ORAL | Status: DC

## 2021-03-05 MED ORDER — INSULIN ASPART 100 UNIT/ML IJ SOLN: 0.0000 [IU] | Freq: Three times a day (TID) | INTRAMUSCULAR | 11 refills | Status: DC

## 2021-03-05 MED ORDER — SEVELAMER CARBONATE 800 MG PO TABS: 1600.0000 mg | ORAL_TABLET | Freq: Three times a day (TID) | ORAL | Status: DC

## 2021-03-05 MED ORDER — INSULIN GLARGINE-YFGN 100 UNIT/ML ~~LOC~~ SOLN
16.0000 [IU] | Freq: Every day | SUBCUTANEOUS | Status: DC
  Filled 2021-03-05: qty 0.16

## 2021-03-05 MED ORDER — DARBEPOETIN ALFA 60 MCG/0.3ML IJ SOSY: 60.0000 ug | PREFILLED_SYRINGE | INTRAMUSCULAR | Status: DC

## 2021-03-05 NOTE — Discharge Summary (Signed)
Physician Discharge Summary  [name] YIF:[id number] DOB: [birth year] DOA: 02/08/2021  PCP: Gildardo Pounds, NP  Admit date: 02/08/2021 Discharge date: 03/05/2021  Time spent: 40 minutes  Recommendations for Outpatient Follow-up:  Patient will continue hemodialysis at [facility] every MWF at 11:15 AM Patient needs to follow-up with [facility] regarding previously discussed eye surgery.  Practice will call nursing home with appointment date and time Continue to check glucometer readings AC/HS. he has long-acting insulin, meal coverage insulin and sliding scale insulin available as necessary. He will discharge to [facility], SNF Patient will need to follow-up with cardiologist/Dr. Marlou Porch.  An ambulatory referral will be sent to the practice.  Subsequently they will contact you with an appointment date and time. Once patient is confirmed to be eligible for disability and begins receiving checks likely discharge disposition will be to a group home.  It is recommended that the facility social worker follow-up to begin the process of group home placement once disability status known.   Discharge Diagnoses:  Principal Problem:   Seizure-like activity ([place]) Active Problems:   Essential hypertension   DM2 (diabetes mellitus, type 2) (HCC)   Anemia of chronic kidney failure   Diabetic foot ulcer ([place])   Syncope   DKA (diabetic ketoacidosis) ([place])   Hypertrophic cardiomyopathy ([place])    Discharge Condition: Stable  Diet recommendation: Carbohydrate modified with 1200 cc fluid restriction  Filed Weights   03/04/21 0609 03/04/21 1711 03/04/21 2115  Weight: 98 kg 94 kg 94 kg    History of present illness:  35 year old male with history of ESRD on dialysis,IDDM on insulin, HTN, HLD, legally blind, presented with syncope versus seizure.  Blood glucose were severely elevated on presentation.  He was thought to have seizures from elevated blood glucose  levels.  Found to have incidental positive for COVID, no respiratory issues.  He used to live with his aunt but she is unable to take him back home.    Hospital Course:  Acute problems: Acute metabolic encephalopathy:  Resolved Initially thought to have seizure activity from hyperglycemia.   EEG with questionable dysfunction in the left temporal region.  MRI was negative   Legally blind status Patient can basically see shadows and changes in light but otherwise has extremely restricted vision Given patient is diabetic and on hemodialysis in context of underlying legally blind status preferential discharge plan will be to discharge to group home or short-term SNF until can obtain group home placement.  Currently patient does not have disability noting the application is pending and because of this we are unable to start the group home certification process.   Hyperglycemia/insulin-dependent diabetes type 2:  Ongoing diabetes greater than 10 years and this is likely the etiology to his renal failure CBGs dipped to 53 earlier this morning. HS Semglee decreased from 20 units to 16 units and meal coverage added. Continue very sensitive SSI Hemoglobin A1c was 12.9 02/08/2021; this is up from 7.2 on 11/25/2020 and is likely representative of recent acute infectious process   Incidental COVID-positive:  Asymptomatic.  Initial chest x-ray was consistent with volume overload.  Received 1 dose of monoclonal antibody on admission.   Off isolation In the next 30 to 60 days patient will be eligible for COVID vaccination   Acute otitis media of the right ear:  Confirmed with otoscopic examination by previous provider Completed a course of Augmentin on 8/22   ESRD on dialysis:  Patient started on dialysis January  2022; progressed from CKD4  to CKD 5 at that time.  Has known horseshoe kidney Dialyzes at [facility] every MWF    Acute systolic congestive heart failure/new dx Hypertrophic CM:   Echo obtained during this hospitalization showed EF of 45% with global hypokinesis, moderate TR with associated elevated pulmonary artery pressure of 52 mmHg Echocardiogram in 2021 with EF 55 to 60%, moderate LVH and pulmonary artery pressure of 46 mmHg This is likely related to patient's underlying hypertensive heart disease but given change in EF with diffuse global hypokinesis we will ask cardiology to see during hospitalization Volume management as per dialysis Continue carvedilol and Cozaar Continue  I/O and daily weights Cardiology consultation/Dr Skains rec: - Continue with carvedilol 25 mg twice a day- Not a candidate for Entresto given end-stage renal disease.  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  We could always check a PYP scan in the future.   -As outpatient would place a Zio patch monitor for 2 weeks looking for any adverse arrhythmias.  We can help facilitate.   Right toe ulcer:  He was treated with 14 days course of doxycycline ,follows with  podiatry.   Recommend outpatient follow-up.   Right middle toe also has healed very well   Hypertension:  Continue Apresoline, carvedilol, and Cozaar  Procedures: Echocardiogram  Consultations: Nephrology Urology  Discharge Exam: Vitals:   03/05/21 0625 03/05/21 0800  BP: (!) 170/107 (!) 151/87  Pulse: 69   Resp: 16   Temp: 98.1 F (36.7 C)   SpO2: 100%    Constitutional: No acute distress, calm Respiratory: Lung sounds remain clear, stable on room air, no shortness of breath with activity observed Cardiovascular: S1-S2, no JVD or peripheral edema, normotensive.  Has left upper extremity AVF Abdomen: Nontender, minimally distended, normoactive bowel sounds.  Eating well.  LBM 8/22 Neurologic: CN 2-12 grossly intact. Sensation intact, DStrength 5/5 x all 4 extremities.  Psychiatric: Normal  judgment and insight. Alert and oriented x 3. Normal mood.   Discharge Instructions   Discharge Instructions     Ambulatory referral to Cardiology   Complete by: As directed    Patient evaluated in the hospital and this will be hospital follow-up for struct of cardiomyopathy.  Patient will discharge to [facility] skilled nursing facility.  He also has dialysis on MWF.   Diet Carb Modified   Complete by: As directed    1200 cc fluid restriction per day   Discharge instructions   Complete by: As directed    Check CBGs AC/HS  Continue occupational therapy.  Encourage patient to socialize in facility and participate in scheduled activities.   Discharge wound care:   Complete by: As directed    Patient has open ulcers on toe on left and right foot.  Please apply nonadherent dressing daily and as needed   Increase activity slowly   Complete by: As directed       Allergies as of 03/05/2021       Reactions   Amlodipine Nausea And Vomiting, Other (See Comments)   Patient was taking Amlodipine and Hydralazine at the same time, so the reactions came from one of the 2: Lethargy and an all-over feeling of NOT feeling well (also)   Hydralazine Nausea And Vomiting, Other (See Comments)   Patient was taking Hydralazine AND Amlodipine at the same time, so the reactions came from one  of the 2: Lethargy and an all-over feeling of NOT feeling well (also)        Medication List     STOP taking these medications    doxycycline 100 MG tablet Commonly known as: VIBRA-TABS   feeding supplement ([name] CARB STEADY) Liqd   hydrALAZINE 25 MG tablet Commonly known as: APRESOLINE   insulin detemir 100 UNIT/ML injection Commonly known as: LEVEMIR   torsemide 20 MG tablet Commonly known as: DEMADEX   True Metrix Meter w/Device Kit   TRUEplus Lancets 28G Misc       TAKE these medications    acetaminophen 325 MG tablet Commonly known as: TYLENOL Take 2 tablets (650 mg total) by  mouth every 4 (four) hours as needed for headache or mild pain.   atorvastatin 40 MG tablet Commonly known as: LIPITOR TAKE 1 TABLET (40 MG TOTAL) BY MOUTH DAILY.   carvedilol 25 MG tablet Commonly known as: COREG Take 1 tablet (25 mg total) by mouth 2 (two) times daily with a meal. What changed:  medication strength how much to take Another medication with the same name was removed. Continue taking this medication, and follow the directions you see here.   Darbepoetin Alfa 60 MCG/0.3ML Sosy injection Commonly known as: ARANESP Inject 0.3 mLs (60 mcg total) into the vein every Monday with hemodialysis. Start taking on: March 09, 2021   doxercalciferol 4 MCG/2ML injection Commonly known as: [name] Inject 2.5 mLs (5 mcg total) into the vein every Monday, Wednesday, and Friday with hemodialysis. Start taking on: March 06, 2021   hydrOXYzine 25 MG tablet Commonly known as: ATARAX/VISTARIL Take 1 tablet (25 mg total) by mouth 3 (three) times daily as needed for itching.   imipramine 25 MG tablet Commonly known as: TOFRANIL Take 1 tablet (25 mg total) by mouth at bedtime.   insulin aspart 100 UNIT/ML injection Commonly known as: novoLOG Inject 0-6 Units into the skin 3 (three) times daily with meals. Do NOT hold if patient is NPO.   insulin aspart 100 UNIT/ML injection Commonly known as: novoLOG Inject 0-5 Units into the skin at bedtime. Do NOT hold insulin if patient is NPO.   insulin aspart 100 UNIT/ML injection Commonly known as: novoLOG Inject 3 Units into the skin 3 (three) times daily with meals.   insulin glargine-yfgn 100 UNIT/ML injection Commonly known as: SEMGLEE Inject 0.16 mLs (16 Units total) into the skin at bedtime.   losartan 50 MG tablet Commonly known as: COZAAR Take 1 tablet (50 mg total) by mouth at bedtime. What changed:  how much to take when to take this   multivitamin Tabs tablet Take 1 tablet by mouth at bedtime.   sevelamer carbonate  800 MG tablet Commonly known as: RENVELA Take 2 tablets (1,600 mg total) by mouth 3 (three) times daily with meals. What changed:  how much to take how to take this when to take this               Discharge Care Instructions  (From admission, onward)           Start     Ordered   03/05/21 0000  Discharge wound care:       Comments: Patient has open ulcers on toe on left and right foot.  Please apply nonadherent dressing daily and as needed   03/05/21 1050           Allergies  Allergen Reactions   Amlodipine Nausea And Vomiting and Other (See Comments)  Patient was taking Amlodipine and Hydralazine at the same time, so the reactions came from one of the 2: Lethargy and an all-over feeling of NOT feeling well (also)   Hydralazine Nausea And Vomiting and Other (See Comments)    Patient was taking Hydralazine AND Amlodipine at the same time, so the reactions came from one of the 2: Lethargy and an all-over feeling of NOT feeling well (also)    Follow-up Information     [place], Bhavinkumar, PA Follow up on 04/08/2021.   Specialty: Cardiology Why: Please arrive 15 minutes early for your 2:15pm post-hospital cardiology appointment. Contact information: [address] [place] [address] [phone]         [facility] Follow up.   Why: You are scheduled for dialysis on MWF at 11:15.        [place], [place]. Call.   Specialty: Optometry Why: [facility] will you arranging a follow uo appointment. Contact information: [address] [place] [address] [phone]                  The results of significant diagnostics from this hospitalization (including imaging, microbiology, ancillary and laboratory) are listed below for reference.    Significant Diagnostic Studies: DG Chest 2 View  Result Date: 02/22/2021 CLINICAL DATA:  Volume overload.  End-stage renal disease. EXAM:  CHEST - 2 VIEW COMPARISON:  February 08, 2021 FINDINGS: Stable cardiomegaly. The hila and mediastinum are unchanged. No pneumothorax. No nodules or masses. No focal infiltrates. No overt edema. IMPRESSION: No overt edema.  Suggested mild pulmonary venous congestion. Electronically Signed   By: Dorise Bullion III M.D.   On: 02/22/2021 09:42   CT Head Wo Contrast  Result Date: 02/08/2021 CLINICAL DATA:  Nontraumatic seizure. EXAM: CT HEAD WITHOUT CONTRAST TECHNIQUE: Contiguous axial images were obtained from the base of the skull through the vertex without intravenous contrast. COMPARISON:  Nov 26, 2020 FINDINGS: Brain: No evidence of acute infarction, hemorrhage, hydrocephalus, extra-axial collection or mass lesion/mass effect. Vascular: No hyperdense vessel or unexpected calcification. Skull: Normal. Negative for fracture or focal lesion. Sinuses/Orbits: No acute finding. Other: None. IMPRESSION: No acute intracranial abnormalities are identified. Electronically Signed   By: Dorise Bullion III M.D   On: 02/08/2021 15:09   MR BRAIN WO CONTRAST  Result Date: 02/10/2021 CLINICAL DATA:  Seizure EXAM: MRI HEAD WITHOUT CONTRAST TECHNIQUE: Multiplanar, multiecho pulse sequences of the brain and surrounding structures were obtained without intravenous contrast. COMPARISON:  None. FINDINGS: Brain: No acute infarct, mass effect or extra-axial collection. No acute or chronic hemorrhage. There is multifocal hyperintense T2-weighted signal within the [place] matter. Parenchymal volume and CSF spaces are normal. The midline structures are normal. Vascular: Major flow voids are preserved. Skull and upper cervical spine: Normal calvarium and skull base. Visualized upper cervical spine and soft tissues are normal. Sinuses/Orbits:No paranasal sinus fluid levels or advanced mucosal thickening. No mastoid or middle ear effusion. No acute orbital abnormality. 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 No acute  intracranial abnormality. 2. Multifocal hyperintense T2-weighted signal within the [place] matter, nonspecific but most commonly indicating chronic small vessel disease. Electronically Signed   By: Ulyses Jarred M.D.   On: 02/10/2021 00:50   DG Chest Portable 1 View  Result Date: 02/08/2021 CLINICAL DATA:  Cough,CP. EXAM: PORTABLE CHEST 1 VIEW COMPARISON:  Chest radiograph 11/25/2020 FINDINGS: Interval removal of a right central venous catheter. Stable cardiomediastinal contours with enlarged heart  size. Low lung volumes. There is central vascular congestion and slight diffuse bilateral interstitial thickening. No new focal consolidation. No pneumothorax or large pleural effusion. No acute finding in the visualized skeleton. IMPRESSION: Findings suggestive of mild interstitial edema. No new focal consolidation. Electronically Signed   By: Audie Pinto M.D.   On: 02/08/2021 14:08   EEG adult  Result Date: 02/09/2021 Lora Havens, MD     02/09/2021 12:58 PM Patient Name: [name] MRN: [id number] Epilepsy Attending: Lora Havens Referring Physician/Provider: Dr Wynetta Fines Date: 02/09/2021 Duration: 21.41 mins Patient history: 36 year old male presented with syncope, seizure-like activity and altered mental status.  EEG to evaluate for seizures. Level of alertness: Awake, asleep AEDs during EEG study: None Technical aspects: This EEG study was done with scalp electrodes positioned according to the 10-20 International system of electrode placement. Electrical activity was acquired at a sampling rate of $Remov'500Hz'JzaXMq$  and reviewed with a high frequency filter of $RemoveB'70Hz'sSVHnukM$  and a low frequency filter of $RemoveB'1Hz'NXJiswKm$ . EEG data were recorded continuously and digitally stored. Description: The posterior dominant rhythm consists of 8 Hz activity of moderate voltage (25-35 uV) seen predominantly in posterior head regions, symmetric and reactive to eye opening and eye closing. Sleep was characterized by sleep spindles (12 to 14  Hz), maximal frontocentral region. EEG showed continuous generalized and maximal left temporal 3 to 6 Hz theta-delta slowing. Hyperventilation and photic stimulation were not performed.   ABNORMALITY - Continuous slow, generalized and maximal left temporal region IMPRESSION: This study is suggestive of cortical dysfunction in left temporal region which could be secondary to underlying structural abnormality, postictal state.  There is also mild diffuse encephalopathy, nonspecific etiology.  No seizures or epileptiform discharges were seen throughout the recording. Lora Havens   ECHOCARDIOGRAM LIMITED  Result Date: 02/09/2021    ECHOCARDIOGRAM LIMITED REPORT   Patient Name:   PATRIC BUCKHALTER Seeney Date of Exam: 02/09/2021 Medical Rec #:  [id number]       Height:       72.0 in Accession #:    [id number]      Weight:       205.9 lb Date of Birth:  Nov 24, 1985       BSA:          2.157 m Patient Age:    51 years        BP:           148/101 mmHg Patient Gender: M               HR:           85 bpm. Exam Location:  Inpatient Procedure: Limited Echo, 3D Echo, Cardiac Doppler, Color Doppler and Strain            Analysis Indications:    R55 Syncope  History:        Patient has prior history of Echocardiogram examinations, most                 recent 12/04/2019. Signs/Symptoms:Syncope, Altered Mental Status                 and Dyspnea; Risk Factors:Diabetes and Hypertension. Covid                 positive. ESRD. Hypoxia.  Sonographer:    Roseanna Rainbow RDCS Referring Phys: [id number] Lequita Halt  Sonographer Comments: Global longitudinal strain was attempted. IMPRESSIONS  1. Left ventricular ejection fraction, by estimation, is 45 to 50%. The  left ventricle has mildly decreased function. The left ventricle demonstrates global hypokinesis. There is severe left ventricular hypertrophy. Left ventricular diastolic parameters  are indeterminate.  2. Right ventricular systolic function is normal. The right ventricular size is mildly  enlarged. Moderately increased right ventricular wall thickness. There is moderately elevated pulmonary artery systolic pressure. The estimated right ventricular systolic pressure is 78.2 mmHg.  3. The mitral valve is normal in structure. Trivial mitral valve regurgitation.  4. Tricuspid valve regurgitation is moderate.  5. The aortic valve is tricuspid. Aortic valve regurgitation is not visualized.  6. The inferior vena cava is dilated in size with <50% respiratory variability, suggesting right atrial pressure of 15 mmHg. FINDINGS  Left Ventricle: Left ventricular ejection fraction, by estimation, is 45 to 50%. The left ventricle has mildly decreased function. The left ventricle demonstrates global hypokinesis. The left ventricular internal cavity size was normal in size. There is  severe left ventricular hypertrophy. Left ventricular diastolic parameters are indeterminate. Right Ventricle: The right ventricular size is mildly enlarged. Moderately increased right ventricular wall thickness. Right ventricular systolic function is normal. There is moderately elevated pulmonary artery systolic pressure. The tricuspid regurgitant velocity is 3.05 m/s, and with an assumed right atrial pressure of 15 mmHg, the estimated right ventricular systolic pressure is 95.6 mmHg. Pericardium: Trivial pericardial effusion is present. Mitral Valve: The mitral valve is normal in structure. Trivial mitral valve regurgitation. Tricuspid Valve: The tricuspid valve is normal in structure. Tricuspid valve regurgitation is moderate. Aortic Valve: The aortic valve is tricuspid. Aortic valve regurgitation is not visualized. Aorta: The aortic root is normal in size and structure. Venous: The inferior vena cava is dilated in size with less than 50% respiratory variability, suggesting right atrial pressure of 15 mmHg. LEFT VENTRICLE PLAX 2D LVIDd:         4.50 cm LVIDs:         3.70 cm LV PW:         2.30 cm LV IVS:        2.30 cm LVOT diam:      2.50 cm LVOT Area:     4.91 cm  LV Volumes (MOD) LV vol d, MOD A2C: 185.0 ml LV vol d, MOD A4C: 164.0 ml LV vol s, MOD A2C: 91.6 ml LV vol s, MOD A4C: 88.6 ml LV SV MOD A2C:     93.4 ml LV SV MOD A4C:     164.0 ml LV SV MOD BP:      87.3 ml IVC IVC diam: 2.90 cm LEFT ATRIUM         Index LA diam:    4.10 cm 1.90 cm/m   AORTA Ao Root diam: 3.90 cm Ao Asc diam:  3.30 cm TRICUSPID VALVE TR Peak grad:   37.2 mmHg TR Vmax:        305.00 cm/s  SHUNTS Systemic Diam: 2.50 cm Oswaldo Milian MD Electronically signed by Oswaldo Milian MD Signature Date/Time: 02/09/2021/1:41:58 PM    Final     Microbiology: No results found for this or any previous visit (from the past 240 hour(s)).   Labs: Basic Metabolic Panel: Recent Labs  Lab 02/27/21 0840 03/01/21 0117 03/02/21 0745 03/04/21 1511  NA 130* 130* 129* 130*  K 4.9 4.3 4.5 5.0  CL 94* 93* 93* 94*  CO2 $Re'23 25 22 23  'wXM$ GLUCOSE 178* 144* 88 131*  BUN 71* 62* 83* 81*  CREATININE 9.97* 8.94* 10.82* 10.51*  CALCIUM 8.7* 8.9 8.6* 8.4*  PHOS 5.3*  5.3* 6.0* 5.8*   Liver Function Tests: Recent Labs  Lab 02/27/21 0840 03/01/21 0117 03/02/21 0745 03/04/21 1511  ALBUMIN 3.1* 3.2* 3.3* 3.3*   No results for input(s): LIPASE, AMYLASE in the last 168 hours. No results for input(s): AMMONIA in the last 168 hours. CBC: Recent Labs  Lab 02/27/21 0840 03/02/21 0745 03/04/21 1418  WBC 5.1 4.9 4.9  HGB 9.2* 9.0* 9.1*  HCT 29.5* 28.5* 29.3*  MCV 82.9 82.1 82.5  PLT 234 200 193   Cardiac Enzymes: No results for input(s): CKTOTAL, CKMB, CKMBINDEX, TROPONINI in the last 168 hours. BNP: BNP (last 3 results) Recent Labs    03/10/20 1410 08/06/20 2012 02/08/21 1905  BNP 617.0* 2,067.3* >4,500.0*    ProBNP (last 3 results) No results for input(s): PROBNP in the last 8760 hours.  CBG: Recent Labs  Lab 03/04/21 0611 03/04/21 1105 03/04/21 2115 03/05/21 0600 03/05/21 0628  GLUCAP 94 136* 220* 53* 75       Signed:  Erin Hearing ANP [facility] 03/05/2021, 10:52 AM

## 2021-03-05 NOTE — Progress Notes (Addendum)
CSW faxed signed disability documents to [facility]  CSW received confirmation from [place] at [facility] stating the [employer] authorization has been approved.  CSW spoke with patient's [name] to inform her of discharge plan - all questions answered.  Patient will be transported to [facility] via [place] has been scheduled for pick up at 1pm. The number to call for report is [phone].  RN aware of discharge plan.  Madilyn Fireman, MSW, LCSW Transitions of Care  Clinical Social Worker II [phone]

## 2021-03-05 NOTE — Progress Notes (Signed)
NURSING PROGRESS NOTE  [name] [id number] Discharge Data: 03/05/2021 1:56 PM Attending Provider: Charlynne Cousins, MD TKZ:[id number], Vernia Buff, NP     Windy Kalata to be D/C'd Skilled nursing facility per MD order.  Discussed with the patient the After Visit Summary and all questions fully answered. All IV's discontinued with no bleeding noted. All belongings returned to patient for patient to take home.   HOME MEDICATIONS RETURNED  Last Vital Signs:  Blood pressure (!) 151/87, pulse 69, temperature 98.1 F (36.7 C), temperature source Oral, resp. rate 16, height 6' (1.829 m), weight 94 kg, SpO2 100 %.  Discharge Medication List Allergies as of 03/05/2021       Reactions   Amlodipine Nausea And Vomiting, Other (See Comments)   Patient was taking Amlodipine and Hydralazine at the same time, so the reactions came from one of the 2: Lethargy and an all-over feeling of NOT feeling well (also)   Hydralazine Nausea And Vomiting, Other (See Comments)   Patient was taking Hydralazine AND Amlodipine at the same time, so the reactions came from one of the 2: Lethargy and an all-over feeling of NOT feeling well (also)        Medication List     STOP taking these medications    doxycycline 100 MG tablet Commonly known as: VIBRA-TABS   feeding supplement ([name] CARB STEADY) Liqd   hydrALAZINE 25 MG tablet Commonly known as: APRESOLINE   insulin detemir 100 UNIT/ML injection Commonly known as: LEVEMIR   torsemide 20 MG tablet Commonly known as: DEMADEX   True Metrix Meter w/Device Kit   TRUEplus Lancets 28G Misc       TAKE these medications    acetaminophen 325 MG tablet Commonly known as: TYLENOL Take 2 tablets (650 mg total) by mouth every 4 (four) hours as needed for headache or mild pain.   atorvastatin 40 MG tablet Commonly known as: LIPITOR TAKE 1 TABLET (40 MG TOTAL) BY MOUTH DAILY. Notes to patient: 03/06/2021   carvedilol 25 MG tablet Commonly known  as: COREG Take 1 tablet (25 mg total) by mouth 2 (two) times daily with a meal. What changed:  medication strength how much to take Another medication with the same name was removed. Continue taking this medication, and follow the directions you see here. Notes to patient: 03/05/2021   Darbepoetin Alfa 60 MCG/0.3ML Sosy injection Commonly known as: ARANESP Inject 0.3 mLs (60 mcg total) into the vein every Monday with hemodialysis. Start taking on: March 09, 2021   doxercalciferol 4 MCG/2ML injection Commonly known as: [name] Inject 2.5 mLs (5 mcg total) into the vein every Monday, Wednesday, and Friday with hemodialysis. Start taking on: March 06, 2021   hydrOXYzine 25 MG tablet Commonly known as: ATARAX/VISTARIL Take 1 tablet (25 mg total) by mouth 3 (three) times daily as needed for itching.   imipramine 25 MG tablet Commonly known as: TOFRANIL Take 1 tablet (25 mg total) by mouth at bedtime. Notes to patient: 03/05/2021   insulin aspart 100 UNIT/ML injection Commonly known as: novoLOG Inject 0-6 Units into the skin 3 (three) times daily with meals. Do NOT hold if patient is NPO.   insulin aspart 100 UNIT/ML injection Commonly known as: novoLOG Inject 0-5 Units into the skin at bedtime. Do NOT hold insulin if patient is NPO.   insulin aspart 100 UNIT/ML injection Commonly known as: novoLOG Inject 3 Units into the skin 3 (three) times daily with meals.   insulin glargine-yfgn 100  UNIT/ML injection Commonly known as: SEMGLEE Inject 0.16 mLs (16 Units total) into the skin at bedtime. Notes to patient: 03/05/2021   losartan 50 MG tablet Commonly known as: COZAAR Take 1 tablet (50 mg total) by mouth at bedtime. What changed:  how much to take when to take this   multivitamin Tabs tablet Take 1 tablet by mouth at bedtime.   sevelamer carbonate 800 MG tablet Commonly known as: RENVELA Take 2 tablets (1,600 mg total) by mouth 3 (three) times daily with meals. What  changed:  how much to take how to take this when to take this               Discharge Care Instructions  (From admission, onward)           Start     Ordered   03/05/21 0000  Discharge wound care:       Comments: Patient has open ulcers on toe on left and right foot.  Please apply nonadherent dressing daily and as needed   03/05/21 1050

## 2021-03-05 NOTE — Progress Notes (Signed)
AVS packet ready and report called to Nurse at [facility] and report was received. No IV to remove and all pt belongings are in pt possession. Pt will be leaving via [facility] at 1pm.

## 2021-03-05 NOTE — TOC Transition Note (Addendum)
Transition of Care [facility]) - CM/SW Discharge Note   Patient Details  Name: [name] MRN: [id number] Date of Birth: May 19, 1986  [facility] CM/SW Contact:  Curlene Labrum, RN Phone Number: 03/05/2021, 9:41 AM   Clinical Narrative:    Case management met with the patient at the bedside regarding transitions of care - patient accepted for admission to [facility] skilled nursing facility.  CM spoke with CM at [facility], [place] and the patient is scheduled for HD appointments on MWF at 11:15.  I confirmed this with the facility.  The patient was transported by [facility] transportation prior to his admission to the hospital.  [place] has accepted the patient for admission and will be coordinating transportation to [employer].  CM and MSW will continue to follow the patient for SNF placement.  CM called [facility] [phone] and left a message with the office to have the RN follow up with [facility] regarding outpatient follow up.   Final next level of care: [place] Barriers to Discharge: No Barriers Identified   Patient Goals and CMS Choice Patient states their goals for this hospitalization and ongoing recovery are:: Patient will be discharging in the care of [place] facility. CMS Medicare.gov Compare Post Acute Care list provided to:: Patient Choice offered to / list presented to : Patient  Discharge Placement                       Discharge Plan and Services In-house Referral: Clinical Social Work Discharge Planning Services: CM Consult Post Acute Care Choice: Skilled Nursing Facility                               Social Determinants of Health (SDOH) Interventions     Readmission Risk Interventions Readmission Risk Prevention Plan 03/05/2021 11/27/2020 02/29/2020  Transportation Screening Complete Complete Complete  Home Care Screening - -  Complete  Medication Review ([place]) Complete Complete -  PCP or Specialist appointment within 3-5 days of discharge Complete Complete -  [place] or Home Care Consult Complete Complete -  SW Recovery Care/Counseling Consult Complete Complete -  Palliative Care Screening Not Applicable Not Applicable -  [place] Complete Not Applicable -  Some recent data might be hidden

## 2021-03-05 NOTE — Progress Notes (Signed)
Subjective: Noted for discharge today, tolerated dialysis yesterday on schedule, no complaints  Objective Vital signs in last 24 hours: Vitals:   03/04/21 1711 03/04/21 2115 03/05/21 0625 03/05/21 0800  BP: (!) 170/90 (!) 160/99 (!) 170/107 (!) 151/87  Pulse: 77 74 69   Resp: '18 18 16   '$ Temp: 98.2 F (36.8 C) 98.4 F (36.9 C) 98.1 F (36.7 C)   TempSrc: Oral  Oral   SpO2: 98% 98% 100%   Weight: 94 kg 94 kg    Height:       Weight change: -5.9 kg  Physical Exam: General: Adult male NAD, interactive today Heart: RRR, no MRG Lungs: CTA nonlabored breathing Abdomen: NABS, soft NTND, some abdominal wall edema Extremities: No pedal edema Dialysis Access: Positive bruit left arm AV fistula   OP HD: SW MWF  Dialysis Orders: [facility] MWF 4 hrs 180NRe 450/800 86.5 kg 2.0 K/2.0 Ca L AVF -Heparin-none -[name] 5 mcg IV TIW -Venofer 100 mcg IV X 10 doses 3/7 doses given last dose 02/04/21 -Mircera 225 mcg IV q 2 weeks (last dose 02/02/2021)     Problem/Plan: Volume overload - 3.5 L UF last HD 8/24 weight 94 post weight 94 so does not correlate.  He thinks he is gaining some weight since admitted to the hospital eating more food in hospital.  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  Acute systolic HF-EF down to [id number] this admission. Manage volume with HD.  ESRD - on HD MWF.  on schedule next dialysis tomorrow will notify outpatient center [facility] HHS/DMT2: Stable  AMS: resolved, thought to be related to elevated glucose +/- seizure. CT of head without acute changes/MRI neg as well. Neuro involved no anti sz meds req COVID 19- Pt is now off COVID isolation.  HTN:  Cont home meds. On Coreg 25 bid, Cozaar 50. UF with HD and continue to lower volume as tolerated.  Follow-up BP trend with UF on dialysis before raising BP meds Anemia  - Hgb 9.1 . Aranesp 60 q week last given 8/ 22.  Metabolic bone disease -  Ca ok.  Phosphorus 5.8  recently increased Renvela to 2 tabs PO TID AC. Continue VDRA.  R toe ulcer  -Completed doxycycline  Nutrition -Renal Carb mod diet, on protein supplements, renal vits. Disposition -noted discharge to [place] /Prev living with aunt but unable to care for him  Ernest Haber, [facility] Beeper [phone] 03/05/2021,1:33 PM  LOS: 25 days   Labs: Basic Metabolic Panel: Recent Labs  Lab 03/01/21 0117 03/02/21 0745 03/04/21 1511  NA 130* 129* 130*  K 4.3 4.5 5.0  CL 93* 93* 94*  CO2 '25 22 23  '$ GLUCOSE 144* 88 131*  BUN 62* 83* 81*  CREATININE 8.94* 10.82* 10.51*  CALCIUM 8.9 8.6* 8.4*  PHOS 5.3* 6.0* 5.8*   Liver Function Tests: Recent Labs  Lab 03/01/21 0117 03/02/21 0745 03/04/21 1511  ALBUMIN 3.2* 3.3* 3.3*   No results for input(s): LIPASE, AMYLASE in the last 168 hours. No results for input(s): AMMONIA in the last 168 hours. CBC: Recent Labs  Lab 02/27/21 0840 03/02/21 0745 03/04/21 1418  WBC 5.1 4.9 4.9  HGB 9.2* 9.0* 9.1*  HCT 29.5* 28.5* 29.3*  MCV 82.9 82.1 82.5  PLT 234 200 193   Cardiac Enzymes: No results for input(s): CKTOTAL, CKMB, CKMBINDEX, TROPONINI in the last 168 hours. CBG: Recent Labs  Lab 03/04/21 1105 03/04/21  2115 03/05/21 0600 03/05/21 0628 03/05/21 1255  GLUCAP 136* 220* 53* 75 92    Studies/Results: No results found. Medications:  sodium chloride     promethazine (PHENERGAN) injection (IM or IVPB)      atorvastatin  40 mg Oral Daily   carvedilol  25 mg Oral BID WC   Chlorhexidine Gluconate Cloth  6 each Topical Q0600   darbepoetin (ARANESP) injection - DIALYSIS  60 mcg Intravenous Q Mon-HD   doxercalciferol  5 mcg Intravenous Q M,W,F-HD   heparin  5,000 Units Subcutaneous Q12H   imipramine  25 mg Oral QHS   insulin aspart  0-5 Units Subcutaneous QHS   insulin aspart  0-6 Units Subcutaneous TID WC   insulin aspart  3 Units Subcutaneous TID WC   insulin glargine-yfgn  16 Units Subcutaneous QHS    labetalol  10 mg Intravenous Once   losartan  50 mg Oral QHS   multivitamin  1 tablet Oral QHS   sevelamer carbonate  1,600 mg Oral TID WC   sodium chloride flush  3 mL Intravenous Q12H

## 2021-03-05 NOTE — Progress Notes (Signed)
Hypoglycemic Event  CBG: 53 mg/dL @ 0600  Treatment: 8 oz juice/soda, sandwich, graham crackers ( refused glutose)  Symptoms: weak, feel sick  Follow-up CBG: Time:06:28 CBG Result: 75 mg/dL  Possible Reasons for Event: medication regimen  Comments/MD notified: no. protocol started    Babs Sciara

## 2021-03-11 ENCOUNTER — Observation Stay (HOSPITAL_COMMUNITY)
Admission: EM | Admit: 2021-03-11 | Discharge: 2021-03-18 | Disposition: A | Discharge status: Skilled Nursing Facility | Payer: Medicaid Other | Attending: Internal Medicine | Admitting: Internal Medicine

## 2021-03-11 ENCOUNTER — Emergency Department (HOSPITAL_COMMUNITY): Payer: Medicaid Other

## 2021-03-11 DIAGNOSIS — E8770 Fluid overload, unspecified: Secondary | ICD-10-CM

## 2021-03-11 DIAGNOSIS — Z79899 Other long term (current) drug therapy: Secondary | ICD-10-CM | POA: Insufficient documentation

## 2021-03-11 DIAGNOSIS — N186 End stage renal disease: Secondary | ICD-10-CM | POA: Diagnosis not present

## 2021-03-11 DIAGNOSIS — R0902 Hypoxemia: Secondary | ICD-10-CM

## 2021-03-11 DIAGNOSIS — I422 Other hypertrophic cardiomyopathy: Secondary | ICD-10-CM

## 2021-03-11 DIAGNOSIS — Z20822 Contact with and (suspected) exposure to covid-19: Secondary | ICD-10-CM | POA: Insufficient documentation

## 2021-03-11 DIAGNOSIS — Z23 Encounter for immunization: Secondary | ICD-10-CM | POA: Diagnosis not present

## 2021-03-11 DIAGNOSIS — I120 Hypertensive chronic kidney disease with stage 5 chronic kidney disease or end stage renal disease: Secondary | ICD-10-CM | POA: Insufficient documentation

## 2021-03-11 DIAGNOSIS — K920 Hematemesis: Secondary | ICD-10-CM | POA: Insufficient documentation

## 2021-03-11 DIAGNOSIS — Z992 Dependence on renal dialysis: Secondary | ICD-10-CM | POA: Insufficient documentation

## 2021-03-11 DIAGNOSIS — J9601 Acute respiratory failure with hypoxia: Principal | ICD-10-CM | POA: Diagnosis present

## 2021-03-11 DIAGNOSIS — E1165 Type 2 diabetes mellitus with hyperglycemia: Secondary | ICD-10-CM | POA: Diagnosis not present

## 2021-03-11 DIAGNOSIS — Z794 Long term (current) use of insulin: Secondary | ICD-10-CM | POA: Diagnosis not present

## 2021-03-11 DIAGNOSIS — R1011 Right upper quadrant pain: Secondary | ICD-10-CM | POA: Diagnosis present

## 2021-03-11 DIAGNOSIS — E1122 Type 2 diabetes mellitus with diabetic chronic kidney disease: Secondary | ICD-10-CM

## 2021-03-11 DIAGNOSIS — E109 Type 1 diabetes mellitus without complications: Secondary | ICD-10-CM

## 2021-03-11 DIAGNOSIS — E11621 Type 2 diabetes mellitus with foot ulcer: Secondary | ICD-10-CM | POA: Insufficient documentation

## 2021-03-11 DIAGNOSIS — E119 Type 2 diabetes mellitus without complications: Secondary | ICD-10-CM

## 2021-03-11 DIAGNOSIS — I10 Essential (primary) hypertension: Secondary | ICD-10-CM | POA: Diagnosis present

## 2021-03-11 LAB — TYPE AND SCREEN
ABO/RH(D): O POS
Antibody Screen: NEGATIVE

## 2021-03-11 LAB — COMPREHENSIVE METABOLIC PANEL
ALT: 61 U/L — ABNORMAL HIGH (ref 0–44)
AST: 28 U/L (ref 15–41)
Albumin: 3.6 g/dL (ref 3.5–5.0)
Alkaline Phosphatase: 202 U/L — ABNORMAL HIGH (ref 38–126)
Anion gap: 14 (ref 5–15)
BUN: 67 mg/dL — ABNORMAL HIGH (ref 6–20)
CO2: 27 mmol/L (ref 22–32)
Calcium: 8.8 mg/dL — ABNORMAL LOW (ref 8.9–10.3)
Chloride: 97 mmol/L — ABNORMAL LOW (ref 98–111)
Creatinine, Ser: 12.17 mg/dL — ABNORMAL HIGH (ref 0.61–1.24)
GFR, Estimated: 5 mL/min — ABNORMAL LOW (ref >60)
Glucose, Bld: 118 mg/dL — ABNORMAL HIGH (ref 70–99)
Potassium: 4.8 mmol/L (ref 3.5–5.1)
Sodium: 138 mmol/L (ref 135–145)
Total Bilirubin: 1.2 mg/dL (ref 0.3–1.2)
Total Protein: 7.5 g/dL (ref 6.5–8.1)

## 2021-03-11 LAB — CBC WITH DIFFERENTIAL/PLATELET
Abs Immature Granulocytes: 0.02 10*3/uL (ref 0.00–0.07)
Basophils Absolute: 0 10*3/uL (ref 0.0–0.1)
Basophils Relative: 1 %
Eosinophils Absolute: 0.1 10*3/uL (ref 0.0–0.5)
Eosinophils Relative: 1 %
HCT: 29.4 % — ABNORMAL LOW (ref 39.0–52.0)
Hemoglobin: 9.2 g/dL — ABNORMAL LOW (ref 13.0–17.0)
Immature Granulocytes: 0 %
Lymphocytes Relative: 9 %
Lymphs Abs: 0.6 10*3/uL — ABNORMAL LOW (ref 0.7–4.0)
MCH: 26.2 pg (ref 26.0–34.0)
MCHC: 31.3 g/dL (ref 30.0–36.0)
MCV: 83.8 fL (ref 80.0–100.0)
Monocytes Absolute: 0.6 10*3/uL (ref 0.1–1.0)
Monocytes Relative: 9 %
Neutro Abs: 5.1 10*3/uL (ref 1.7–7.7)
Neutrophils Relative %: 80 %
Platelets: 207 10*3/uL (ref 150–400)
RBC: 3.51 MIL/uL — ABNORMAL LOW (ref 4.22–5.81)
RDW: 19 % — ABNORMAL HIGH (ref 11.5–15.5)
WBC: 6.4 10*3/uL (ref 4.0–10.5)
nRBC: 0 % (ref 0.0–0.2)

## 2021-03-11 LAB — RESP PANEL BY RT-PCR (FLU A&B, COVID) ARPGX2
Influenza A by PCR: NEGATIVE
Influenza B by PCR: NEGATIVE
SARS Coronavirus 2 by RT PCR: NEGATIVE

## 2021-03-11 LAB — POC OCCULT BLOOD, ED: Fecal Occult Bld: NEGATIVE

## 2021-03-11 LAB — LIPASE, BLOOD: Lipase: 26 U/L (ref 11–51)

## 2021-03-11 MED ORDER — IOHEXOL 350 MG/ML SOLN
80.0000 mL | Freq: Once | INTRAVENOUS | Status: AC | PRN
  Administered 2021-03-11: 80 mL via INTRAVENOUS

## 2021-03-11 MED ORDER — ONDANSETRON HCL 4 MG/2ML IJ SOLN
4.0000 mg | Freq: Once | INTRAMUSCULAR | Status: AC
  Administered 2021-03-11: 4 mg via INTRAVENOUS
  Filled 2021-03-11: qty 2

## 2021-03-11 MED ORDER — ONDANSETRON 4 MG PO TBDP: 4.0000 mg | ORAL_TABLET | Freq: Once | ORAL | Status: DC

## 2021-03-11 NOTE — ED Provider Notes (Signed)
[facility] EMERGENCY DEPARTMENT Provider Note   CSN: [id number] Arrival date & time: 03/11/21  0249     History Chief Complaint  Patient presents with   Emesis    James CUPPS is a 35 y.o. male past medical history of hypertension, diabetes type 2, hypertrophic cardiomyopathy, anemia of chronic kidney failure, end-stage renal disease with dialysis on Monday Wednesday Friday, bilateral blindness.  Patient presents to the emergency room via EMS coming from [facility] facility.  Per facility note he had 2 episodes of hematemesis last night.  Both times with bright red blood.  Patient states that he has been feeling nauseous for about 3 days.  He has vomited multiple times each day.  Today's first time anyone is said anything about there being blood present.  He has been having regular bowel movements, however the only difference that he has noted is that it the smell is changed.  He has had not had any loose stools.  He complains of generalized abdominal pain that is constant.  He does not take any blood thinners regularly, however he was in the hospital 2 weeks ago and did have heparin at that time.  Only other complaints are a mild headache and dizziness.  He denies any chest pain, shortness of breath, fever, chills.  He denies drinking any alcohol.  He last went to dialysis on Monday and is due for dialysis again today.   Emesis Associated symptoms: abdominal pain and headaches   Associated symptoms: no chills, no cough, no diarrhea, no fever and no sore throat       Past Medical History:  Diagnosis Date   Diabetes mellitus    ESRD on hemodialysis ([place])    High cholesterol    Hypertension    Hypertrophic cardiomyopathy ([place]) 02/26/2021    Patient Active Problem List   Diagnosis Date Noted   Hypertrophic cardiomyopathy ([place]) 02/26/2021   Syncope 02/08/2021   DKA (diabetic ketoacidosis) ([place]) 02/08/2021   Seizure-like activity ([place])     Hyperglycemia    Acute hypoxemic respiratory failure ([place]) 11/25/2020   Diabetic foot ulcer ([place]) 11/24/2020   Hypervolemia associated with renal insufficiency 08/07/2020   Uremia 66/44/0347   Metabolic acidosis 42/59/5638   Hyperkalemia 08/07/2020   Nausea and vomiting 05/07/2020   Anemia of chronic kidney failure 05/07/2020   Dehydration    Hypertensive urgency 02/25/2020   Acute renal failure superimposed on stage 4 chronic kidney disease ([place]) 12/02/2019   Anemia 12/02/2019   Essential hypertension 12/02/2019   SOB (shortness of breath) 12/02/2019   DM2 (diabetes mellitus, type 2) ([place]) 12/02/2019   Diarrhea 03/23/2017   Early satiety 03/23/2017   Generalized abdominal pain 03/23/2017    Past Surgical History:  Procedure Laterality Date   AV FISTULA PLACEMENT Left 08/11/2020   Procedure: LEFT UPPER EXTREMITY ARTERIOVENOUS (AV) FISTULA CREATION;  Surgeon: Cherre Robins, MD;  Location: [place];  Service: Vascular;  Laterality: Left;   FRACTURE SURGERY     I & D EXTREMITY Left 07/16/2014   Procedure: IRRIGATION AND DEBRIDEMENT EXTREMITY/LEFT INDEX FINGER;  Surgeon: Leanora Cover, MD;  Location: [place];  Service: Orthopedics;  Laterality: Left;   IR PERC TUN PERIT CATH WO PORT S&I /IMAG  08/07/2020   IR US GUIDE VASC ACCESS RIGHT  08/07/2020       Family History  Problem Relation Age of Onset   Cancer Mother    Stroke Father    Diabetes Father    Hypertension  Father     Social History   Tobacco Use   Smoking status: Never   Smokeless tobacco: Never  Vaping Use   Vaping Use: Never used  Substance Use Topics   Alcohol use: Not Currently   Drug use: No    Home Medications Prior to Admission medications   Medication Sig Start Date End Date Taking? Authorizing Provider  acetaminophen (TYLENOL) 325 MG tablet Take 2 tablets (650 mg total) by mouth every 4 (four) hours as needed for headache or mild pain. 03/05/21   Samella Parr, NP  atorvastatin (LIPITOR) 40 MG tablet  TAKE 1 TABLET (40 MG TOTAL) BY MOUTH DAILY. Patient taking differently: Take 40 mg by mouth daily. 09/06/20 09/06/21  Gildardo Pounds, NP  carvedilol (COREG) 25 MG tablet Take 1 tablet (25 mg total) by mouth 2 (two) times daily with a meal. 03/05/21   Samella Parr, NP  Darbepoetin Alfa (ARANESP) 60 MCG/0.3ML SOSY injection Inject 0.3 mLs (60 mcg total) into the vein every Monday with hemodialysis. 03/09/21   Samella Parr, NP  doxercalciferol ([name]) 4 MCG/2ML injection Inject 2.5 mLs (5 mcg total) into the vein every Monday, Wednesday, and Friday with hemodialysis. 03/06/21   Samella Parr, NP  hydrOXYzine (ATARAX/VISTARIL) 25 MG tablet Take 1 tablet (25 mg total) by mouth 3 (three) times daily as needed for itching. 11/27/20   Lavina Hamman, MD  imipramine (TOFRANIL) 25 MG tablet Take 1 tablet (25 mg total) by mouth at bedtime. 03/05/21   Samella Parr, NP  insulin aspart (NOVOLOG) 100 UNIT/ML injection Inject 0-6 Units into the skin 3 (three) times daily with meals. Do NOT hold if patient is NPO. 03/05/21   Samella Parr, NP  insulin aspart (NOVOLOG) 100 UNIT/ML injection Inject 0-5 Units into the skin at bedtime. Do NOT hold insulin if patient is NPO. 03/05/21   Samella Parr, NP  insulin aspart (NOVOLOG) 100 UNIT/ML injection Inject 3 Units into the skin 3 (three) times daily with meals. 03/05/21   Samella Parr, NP  insulin glargine-yfgn (SEMGLEE) 100 UNIT/ML injection Inject 0.16 mLs (16 Units total) into the skin at bedtime. 03/05/21   Samella Parr, NP  losartan (COZAAR) 50 MG tablet Take 1 tablet (50 mg total) by mouth at bedtime. 03/05/21   Samella Parr, NP  multivitamin (RENA-VIT) TABS tablet Take 1 tablet by mouth at bedtime. 11/27/20   Lavina Hamman, MD  sevelamer carbonate (RENVELA) 800 MG tablet Take 2 tablets (1,600 mg total) by mouth 3 (three) times daily with meals. 03/05/21   Samella Parr, NP    Allergies    Amlodipine and Hydralazine  Review of  Systems   Review of Systems  Constitutional:  Negative for chills and fever.  HENT:  Negative for congestion, rhinorrhea and sore throat.   Eyes:        Blind in both eyes at baseline  Respiratory:  Negative for cough, chest tightness and shortness of breath.   Cardiovascular:  Negative for chest pain, palpitations and leg swelling.  Gastrointestinal:  Positive for abdominal pain, nausea and vomiting. Negative for abdominal distention, constipation and diarrhea.       Unknown blood in stool, change in smell +, hematemesis +  Genitourinary:  Negative for flank pain.  Musculoskeletal:  Negative for back pain.  Skin:  Negative for rash and wound.  Neurological:  Positive for dizziness, light-headedness and headaches. Negative for syncope and weakness.  All other  systems reviewed and are negative.  Physical Exam Updated Vital Signs BP (!) 156/100 (BP Location: Right Arm)   Pulse 83   Temp 98.8 F (37.1 C) (Oral)   Resp 18   SpO2 92%   Physical Exam Vitals and nursing note reviewed.  Constitutional:      General: He is not in acute distress.    Appearance: Normal appearance. He is not ill-appearing (Appears to be hunched over and looks uncomfortable), toxic-appearing or diaphoretic.  HENT:     Head: Normocephalic and atraumatic.     Mouth/Throat:     Mouth: Mucous membranes are moist.     Pharynx: Oropharynx is clear. No oropharyngeal exudate or posterior oropharyngeal erythema.  Eyes:     General: No scleral icterus.       Right eye: No discharge.        Left eye: No discharge.     Conjunctiva/sclera: Conjunctivae normal.  Cardiovascular:     Rate and Rhythm: Normal rate and regular rhythm.     Pulses: Normal pulses.     Heart sounds: Normal heart sounds, S1 normal and S2 normal. No murmur heard.   No friction rub. No gallop.  Pulmonary:     Effort: Pulmonary effort is normal. No respiratory distress.     Breath sounds: Normal breath sounds. No wheezing, rhonchi or rales.   Abdominal:     General: Abdomen is flat. Bowel sounds are normal. There is no distension.     Palpations: Abdomen is soft. There is no pulsatile mass.     Tenderness: There is abdominal tenderness in the right upper quadrant. There is no guarding or rebound. Positive signs include Murphy's sign.  Musculoskeletal:     Right lower leg: No edema.     Left lower leg: No edema.  Skin:    General: Skin is warm and dry.     Coloration: Skin is not jaundiced.     Findings: No bruising, erythema, lesion or rash.  Neurological:     General: No focal deficit present.     Mental Status: He is alert and oriented to person, place, and time.  Psychiatric:        Mood and Affect: Mood normal.        Behavior: Behavior normal.    ED Results / Procedures / Treatments   Labs (all labs ordered are listed, but only abnormal results are displayed) Labs Reviewed  CBC WITH DIFFERENTIAL/PLATELET - Abnormal; Notable for the following components:      Result Value   RBC 3.51 (*)    Hemoglobin 9.2 (*)    HCT 29.4 (*)    RDW 19.0 (*)    Lymphs Abs 0.6 (*)    All other components within normal limits  COMPREHENSIVE METABOLIC PANEL - Abnormal; Notable for the following components:   Chloride 97 (*)    Glucose, Bld 118 (*)    BUN 67 (*)    Creatinine, Ser 12.17 (*)    Calcium 8.8 (*)    ALT 61 (*)    Alkaline Phosphatase 202 (*)    GFR, Estimated 5 (*)    All other components within normal limits  RESP PANEL BY RT-PCR (FLU A&B, COVID) ARPGX2  LIPASE, BLOOD  POC OCCULT BLOOD, ED  TYPE AND SCREEN    EKG None  Radiology CT ABDOMEN PELVIS W CONTRAST  Result Date: 03/11/2021 CLINICAL DATA:  Abdominal pain, acute, nonlocalized EXAM: CT ABDOMEN AND PELVIS WITH CONTRAST TECHNIQUE: Multidetector CT imaging of  the abdomen and pelvis was performed using the standard protocol following bolus administration of intravenous contrast. CONTRAST:  60mL OMNIPAQUE IOHEXOL 350 MG/ML SOLN COMPARISON:  Ultrasound  03/11/2021, limited abdominal CT 02/29/2020 FINDINGS: Lower chest: Extensive ground-glass opacity with patchy consolidation within the bilateral lung bases, right slightly worse than left. Cardiomegaly with small pericardial effusion. Hepatobiliary: Heterogeneous appearance of the liver. No focal liver lesion is identified. Unremarkable appearance of the gallbladder. No hyperdense gallstone. No intrahepatic biliary dilatation. Pancreas: Unremarkable. No pancreatic ductal dilatation or surrounding inflammatory changes. Spleen: Normal in size without focal abnormality. Adrenals/Urinary Tract: Unremarkable adrenal glands. Horseshoe kidney. No focal renal lesion, stone, or hydronephrosis. Urinary bladder is within normal limits. Stomach/Bowel: Stomach is within normal limits. Appendix appears normal (series 3, image 63). No evidence of bowel wall thickening, distention, or inflammatory changes. Vascular/Lymphatic: Normal caliber aorta. Small vessel calcifications are seen within the pelvis. Lymphadenopathy. Reproductive: Prostate is unremarkable. Other: Small to moderate volume ascites throughout the abdomen and pelvis. No organized or rim enhancing fluid collections. No pneumoperitoneum. No abdominal wall hernia. Musculoskeletal: Diffuse anasarca.  No acute osseous abnormality. IMPRESSION: 1. Extensive ground-glass opacity with patchy consolidation within the bilateral lung bases, right slightly worse than left. Findings may reflect pulmonary edema, multifocal pneumonia, versus ARDS. 2. Cardiomegaly with small pericardial effusion. 3. Small to moderate volume ascites throughout the abdomen and pelvis. 4. Diffuse anasarca. 5. Heterogeneous appearance of the liver may reflect hepatic venous congestion, acute hepatitis, or possibly fatty infiltration. 6. Horseshoe kidney. 7. Age advanced atherosclerosis. Electronically Signed   By: Davina Poke D.O.   On: 03/11/2021 13:05   DG Abdomen Acute W/Chest  Result Date:  03/11/2021 CLINICAL DATA:  Abdominal pain.  Vomiting blood EXAM: DG ABDOMEN ACUTE WITH 1 VIEW CHEST COMPARISON:  02/29/2020 FINDINGS: Formed stool is seen in the colon. No concerning mass effect or calcification. No gas dilated bowel. Cardiomegaly and vascular pedicle widening with diffuse hazy pulmonary opacity asymmetric to the right lung. No effusion or pneumothorax. IMPRESSION: 1. Negative abdominal radiograph. 2. Airspace disease asymmetric to the right lung, history suggesting edema or aspiration. Electronically Signed   By: Monte Fantasia M.D.   On: 03/11/2021 04:12   US Abdomen Limited RUQ (LIVER/GB)  Result Date: 03/11/2021 CLINICAL DATA:  Right upper quadrant pain EXAM: ULTRASOUND ABDOMEN LIMITED RIGHT UPPER QUADRANT COMPARISON:  CT abdomen/pelvis 02/29/2020 FINDINGS: Gallbladder: No gallstones or wall thickening visualized. No sonographic Murphy sign noted by sonographer. Common bile duct: Diameter: 8 mm. There is no intrahepatic biliary ductal dilatation. No obstructing lesion or stone is seen. Liver: No focal lesion identified. Within normal limits in parenchymal echogenicity. Portal vein is patent on color Doppler imaging with normal direction of blood flow towards the liver. Other: There is mild ascites. IMPRESSION: 1. Mild biliary ductal dilatation measuring up to 8 mm. No stone or other obstructing lesion is seen. Correlate with LFTs, and MRCP may be considered if indicated. 2. Mild ascites. Electronically Signed   By: Valetta Mole M.D.   On: 03/11/2021 10:15    Procedures Procedures   Medications Ordered in ED Medications  ondansetron (ZOFRAN) injection 4 mg (4 mg Intravenous Given 03/11/21 0904)  iohexol (OMNIPAQUE) 350 MG/ML injection 80 mL (80 mLs Intravenous Contrast Given 03/11/21 1222)    ED Course  I have reviewed the triage vital signs and the nursing notes.  Pertinent labs & imaging results that were available during my care of the patient were reviewed by me and  considered in my medical decision  making (see chart for details).    MDM Rules/Calculators/A&P                          Patient appears uncomfortable. His vitals are overall unremarkable.  Does not appear to be acutely ill.  Exam notable for right upper quadrant tenderness.  ALT is slightly elevated along with alk phos.  Plan to do right upper quadrant ultrasound to evaluate for gallbladder disease.  If ultrasound is unremarkable, will obtain CT abdomen pelvis due to patient's high comorbidities and an unknown cause to his symptoms.  Reviewed labs and hemoglobin is stable from per patient's baseline.  Hemoccult done today which is negative.  I have low suspicion for a major GI bleed, however Mallory-Weiss tear from persistent vomiting or gastric ulcer cannot be excluded.  No acute cause of bleeding is identified.  Hemoglobin  Date Value Ref Range Status  03/11/2021 9.2 (L) 13.0 - 17.0 g/dL Final  03/04/2021 9.1 (L) 13.0 - 17.0 g/dL Final  03/02/2021 9.0 (L) 13.0 - 17.0 g/dL Final  02/27/2021 9.2 (L) 13.0 - 17.0 g/dL Final  09/16/2020 8.1 (L) 13.0 - 17.7 g/dL Final   Ultrasound of gallbladder came back with no evidence of gallstone disease.  He did have some mild biliary dilatation.  Will obtain CT abdomen pelvis for further evaluation.  CT results with groundglass opacity with patchy consolidation within bilateral lung bases that is concerning for possible pneumonia.  He also has a small pericardial effusion.  Found small to moderate volume ascites throughout abdomen and pelvis with diffuse anasarca.  Heterogeneous appearance of the liver may reflect hepatic venous congestion, acute hepatitis, or possible fatty infiltration.  Discussed these results with Dr. Sabra Heck.  All of suggested that he needs dialysis.  No acute concerns.  Will obtain COVID swab since signs of pneumonia are noted on CT chest.  Patient is asymptomatic though.  Patient is stable to return back to nursing facility.  He  needs to be set up for dialysis for tonight or tomorrow.  He needs to follow-up with his PCP regarding his GI symptoms.   Portions of this note were generated with [employer]. Dictation errors may occur despite best attempts at proofreading.   Final Clinical Impression(s) / ED Diagnoses Final diagnoses:  Right upper quadrant pain  Hematemesis without nausea    Rx / DC Orders ED Discharge Orders     None        Adolphus Birchwood, PA-C 03/11/21 1518    Noemi Chapel, MD 03/21/21 [phone]

## 2021-03-11 NOTE — ED Notes (Addendum)
Pt transported to Ultrasound.  

## 2021-03-11 NOTE — ED Triage Notes (Signed)
Pt here via [facility] c/o vomiting blood, from [place]. 2 episodes emesis tonight, both times bright red blood. Endorses generalized abd pain, 5/10. Pt endorses similar episode earlier this year, unsure of diagnosis from episode. Dialysis MWF, complaint & completes treatment L arm restricted Legally blind 18R forearm  180/118, all other vitals stable

## 2021-03-11 NOTE — ED Notes (Signed)
Attempted to call report to [employer], and received a voicemail. This RN left a message and asked for the facility to call back but stated we would be sending him back this evening. We Will continue to monitor.

## 2021-03-11 NOTE — ED Provider Notes (Signed)
Dialysis pt - blind -  Has been vomiting last few days - couple of episodes -  No hx of anti coag use - needs dialysis today per schedule Mild anemia on exam - at baseline - K is normal No cough / sob - no CP -  Abd is mild ttp in the RUQ.  RUQ [place] - may need CT if no answers - hemoccult Anti emetics PRN  CT scan shows some pulmonary edema, likely in need of patient needing dialysis, patient otherwise appears well  Medical screening examination/treatment/procedure(s) were conducted as a shared visit with non-physician practitioner(s) and myself.  I personally evaluated the patient during the encounter.  Clinical Impression:   Final diagnoses:  Right upper quadrant pain  Hematemesis without nausea  Hypoxia          Noemi Chapel, MD 03/21/21 [phone]

## 2021-03-11 NOTE — ED Notes (Signed)
Patient transported to CT 

## 2021-03-11 NOTE — ED Provider Notes (Signed)
Emergency Medicine Provider Triage Evaluation Note  [name] , a 35 y.o. male  was evaluated in triage.  Pt complains of several episodes of bloody emesis onset approximately 2 hours ago.  Patient reports he is blind but was reported to him that it was bright red blood.  States he has 5/10 abdominal cramping generalized in nature.  Patient with history of ESRD on dialysis Monday Wednesday Friday.  No missed dialysis sessions.  Reports he does make urine.  No treatments prior to arrival.  No specific aggravating or alleviating factors.  Review of Systems  Positive: Abdominal pain, Nausea and vomiting Negative: diarrhea, Fever, chills  Physical Exam  BP (!) 196/118 (BP Location: Right Arm)   Pulse 80   Temp 98.8 F (37.1 C) (Oral)   Resp 17   SpO2 95%  Gen:   Awake, no distress   Resp:  Normal effort  MSK:   Moves extremities without difficulty  Other:  Abd soft but tender throughout  Medical Decision Making  Medically screening exam initiated at 3:13 AM.  Appropriate orders placed.  RAQUON HARSH was informed that the remainder of the evaluation will be completed by another provider, this initial triage assessment does not replace that evaluation, and the importance of remaining in the ED until their evaluation is complete.  Labs and imaging pending.   Nijae Doyel, Gwenlyn Perking [id number]    Delora Fuel, MD [id number] [phone]

## 2021-03-12 ENCOUNTER — Encounter (HOSPITAL_COMMUNITY): Payer: Self-pay | Admitting: Internal Medicine

## 2021-03-12 DIAGNOSIS — J9601 Acute respiratory failure with hypoxia: Secondary | ICD-10-CM | POA: Diagnosis present

## 2021-03-12 DIAGNOSIS — N186 End stage renal disease: Secondary | ICD-10-CM

## 2021-03-12 LAB — I-STAT ARTERIAL BLOOD GAS, ED
Acid-Base Excess: 4 mmol/L — ABNORMAL HIGH (ref 0.0–2.0)
Bicarbonate: 29.4 mmol/L — ABNORMAL HIGH (ref 20.0–28.0)
Calcium, Ion: 1.07 mmol/L — ABNORMAL LOW (ref 1.15–1.40)
HCT: 32 % — ABNORMAL LOW (ref 39.0–52.0)
Hemoglobin: 10.9 g/dL — ABNORMAL LOW (ref 13.0–17.0)
O2 Saturation: 65 %
Patient temperature: 98.6
Potassium: 4.1 mmol/L (ref 3.5–5.1)
Sodium: 136 mmol/L (ref 135–145)
TCO2: 31 mmol/L (ref 22–32)
pCO2 arterial: 44.3 mmHg (ref 32.0–48.0)
pH, Arterial: 7.429 (ref 7.350–7.450)
pO2, Arterial: 33 mmHg — CL (ref 83.0–108.0)

## 2021-03-12 LAB — BASIC METABOLIC PANEL
Anion gap: 13 (ref 5–15)
BUN: 44 mg/dL — ABNORMAL HIGH (ref 6–20)
CO2: 28 mmol/L (ref 22–32)
Calcium: 8.7 mg/dL — ABNORMAL LOW (ref 8.9–10.3)
Chloride: 95 mmol/L — ABNORMAL LOW (ref 98–111)
Creatinine, Ser: 8.7 mg/dL — ABNORMAL HIGH (ref 0.61–1.24)
GFR, Estimated: 7 mL/min — ABNORMAL LOW (ref >60)
Glucose, Bld: 141 mg/dL — ABNORMAL HIGH (ref 70–99)
Potassium: 4.1 mmol/L (ref 3.5–5.1)
Sodium: 136 mmol/L (ref 135–145)

## 2021-03-12 LAB — RENAL FUNCTION PANEL
Albumin: 3.4 g/dL — ABNORMAL LOW (ref 3.5–5.0)
Anion gap: 16 — ABNORMAL HIGH (ref 5–15)
BUN: 82 mg/dL — ABNORMAL HIGH (ref 6–20)
CO2: 24 mmol/L (ref 22–32)
Calcium: 8.5 mg/dL — ABNORMAL LOW (ref 8.9–10.3)
Chloride: 95 mmol/L — ABNORMAL LOW (ref 98–111)
Creatinine, Ser: 13.59 mg/dL — ABNORMAL HIGH (ref 0.61–1.24)
GFR, Estimated: 4 mL/min — ABNORMAL LOW (ref >60)
Glucose, Bld: 99 mg/dL (ref 70–99)
Phosphorus: 6 mg/dL — ABNORMAL HIGH (ref 2.5–4.6)
Potassium: 5.7 mmol/L — ABNORMAL HIGH (ref 3.5–5.1)
Sodium: 135 mmol/L (ref 135–145)

## 2021-03-12 LAB — CBC
HCT: 28.6 % — ABNORMAL LOW (ref 39.0–52.0)
Hemoglobin: 8.9 g/dL — ABNORMAL LOW (ref 13.0–17.0)
MCH: 26.3 pg (ref 26.0–34.0)
MCHC: 31.1 g/dL (ref 30.0–36.0)
MCV: 84.4 fL (ref 80.0–100.0)
Platelets: 208 10*3/uL (ref 150–400)
RBC: 3.39 MIL/uL — ABNORMAL LOW (ref 4.22–5.81)
RDW: 19 % — ABNORMAL HIGH (ref 11.5–15.5)
WBC: 6.4 10*3/uL (ref 4.0–10.5)
nRBC: 0 % (ref 0.0–0.2)

## 2021-03-12 LAB — TROPONIN I (HIGH SENSITIVITY): Troponin I (High Sensitivity): 33 ng/L — ABNORMAL HIGH (ref <18)

## 2021-03-12 LAB — GLUCOSE, CAPILLARY
Glucose-Capillary: 204 mg/dL — ABNORMAL HIGH (ref 70–99)
Glucose-Capillary: 263 mg/dL — ABNORMAL HIGH (ref 70–99)

## 2021-03-12 LAB — HIV ANTIBODY (ROUTINE TESTING W REFLEX): HIV Screen 4th Generation wRfx: NONREACTIVE

## 2021-03-12 LAB — CBG MONITORING, ED
Glucose-Capillary: 88 mg/dL (ref 70–99)
Glucose-Capillary: 143 mg/dL — ABNORMAL HIGH (ref 70–99)

## 2021-03-12 LAB — BRAIN NATRIURETIC PEPTIDE: B Natriuretic Peptide: 4361.1 pg/mL — ABNORMAL HIGH (ref 0.0–100.0)

## 2021-03-12 MED ORDER — CAMPHOR-MENTHOL 0.5-0.5 % EX LOTN
1.0000 "application " | TOPICAL_LOTION | Freq: Three times a day (TID) | CUTANEOUS | Status: DC | PRN
  Filled 2021-03-12: qty 222

## 2021-03-12 MED ORDER — ALTEPLASE 2 MG IJ SOLR: 2.0000 mg | Freq: Once | INTRAMUSCULAR | Status: DC | PRN

## 2021-03-12 MED ORDER — NITROGLYCERIN 2 % TD OINT
1.0000 [in_us] | TOPICAL_OINTMENT | Freq: Once | TRANSDERMAL | Status: DC
  Filled 2021-03-12: qty 1

## 2021-03-12 MED ORDER — COVID-19 MRNA VAC-TRIS(PFIZER) 30 MCG/0.3ML IM SUSP
0.3000 mL | Freq: Once | INTRAMUSCULAR | Status: DC
  Filled 2021-03-12: qty 0.3

## 2021-03-12 MED ORDER — ACETAMINOPHEN 325 MG PO TABS: 650.0000 mg | ORAL_TABLET | Freq: Four times a day (QID) | ORAL | Status: DC | PRN

## 2021-03-12 MED ORDER — IMIPRAMINE HCL 25 MG PO TABS
25.0000 mg | ORAL_TABLET | Freq: Every day | ORAL | Status: DC
  Administered 2021-03-12 – 2021-03-17 (×5): 25 mg via ORAL
  Filled 2021-03-12 (×7): qty 1

## 2021-03-12 MED ORDER — ACETAMINOPHEN 650 MG RE SUPP: 650.0000 mg | Freq: Four times a day (QID) | RECTAL | Status: DC | PRN

## 2021-03-12 MED ORDER — SODIUM CHLORIDE 0.9 % IV SOLN: 100.0000 mL | INTRAVENOUS | Status: DC | PRN

## 2021-03-12 MED ORDER — PENTAFLUOROPROP-TETRAFLUOROETH EX AERO: 1.0000 "application " | INHALATION_SPRAY | CUTANEOUS | Status: DC | PRN

## 2021-03-12 MED ORDER — LOSARTAN POTASSIUM 50 MG PO TABS
50.0000 mg | ORAL_TABLET | Freq: Once | ORAL | Status: AC
  Administered 2021-03-12: 50 mg via ORAL
  Filled 2021-03-12: qty 1

## 2021-03-12 MED ORDER — HYDROXYZINE HCL 25 MG PO TABS: 25.0000 mg | ORAL_TABLET | Freq: Three times a day (TID) | ORAL | Status: DC | PRN

## 2021-03-12 MED ORDER — ATORVASTATIN CALCIUM 40 MG PO TABS
40.0000 mg | ORAL_TABLET | Freq: Every day | ORAL | Status: DC
  Administered 2021-03-12 – 2021-03-18 (×7): 40 mg via ORAL
  Filled 2021-03-12 (×7): qty 1

## 2021-03-12 MED ORDER — NEPRO/CARBSTEADY PO LIQD: 237.0000 mL | Freq: Three times a day (TID) | ORAL | Status: DC | PRN

## 2021-03-12 MED ORDER — SODIUM CHLORIDE 0.9% FLUSH
3.0000 mL | Freq: Two times a day (BID) | INTRAVENOUS | Status: DC
  Administered 2021-03-12 – 2021-03-17 (×8): 3 mL via INTRAVENOUS

## 2021-03-12 MED ORDER — DOXERCALCIFEROL 4 MCG/2ML IV SOLN
5.0000 ug | INTRAVENOUS | Status: DC
  Administered 2021-03-13 – 2021-03-18 (×2): 5 ug via INTRAVENOUS
  Filled 2021-03-12 (×4): qty 4

## 2021-03-12 MED ORDER — LIDOCAINE HCL (PF) 1 % IJ SOLN: 5.0000 mL | INTRAMUSCULAR | Status: DC | PRN

## 2021-03-12 MED ORDER — HEPARIN SODIUM (PORCINE) 1000 UNIT/ML DIALYSIS
1000.0000 [IU] | INTRAMUSCULAR | Status: DC | PRN
  Filled 2021-03-12: qty 1

## 2021-03-12 MED ORDER — LOSARTAN POTASSIUM 50 MG PO TABS
50.0000 mg | ORAL_TABLET | Freq: Every day | ORAL | Status: DC
  Administered 2021-03-12 – 2021-03-18 (×7): 50 mg via ORAL
  Filled 2021-03-12 (×7): qty 1

## 2021-03-12 MED ORDER — CARVEDILOL 25 MG PO TABS
25.0000 mg | ORAL_TABLET | Freq: Two times a day (BID) | ORAL | Status: DC
  Administered 2021-03-13 – 2021-03-18 (×9): 25 mg via ORAL
  Filled 2021-03-12: qty 1
  Filled 2021-03-12: qty 8
  Filled 2021-03-12 (×7): qty 1

## 2021-03-12 MED ORDER — CHLORHEXIDINE GLUCONATE CLOTH 2 % EX PADS
6.0000 | MEDICATED_PAD | Freq: Every day | CUTANEOUS | Status: DC
  Administered 2021-03-12: 6 via TOPICAL

## 2021-03-12 MED ORDER — CALCIUM CARBONATE ANTACID 1250 MG/5ML PO SUSP
500.0000 mg | Freq: Four times a day (QID) | ORAL | Status: DC | PRN
  Filled 2021-03-12: qty 5

## 2021-03-12 MED ORDER — ZOLPIDEM TARTRATE 5 MG PO TABS
5.0000 mg | ORAL_TABLET | Freq: Every evening | ORAL | Status: DC | PRN
  Administered 2021-03-14 – 2021-03-17 (×4): 5 mg via ORAL
  Filled 2021-03-12 (×4): qty 1

## 2021-03-12 MED ORDER — ONDANSETRON HCL 4 MG PO TABS: 4.0000 mg | ORAL_TABLET | Freq: Four times a day (QID) | ORAL | Status: DC | PRN

## 2021-03-12 MED ORDER — LIDOCAINE-PRILOCAINE 2.5-2.5 % EX CREA: 1.0000 "application " | TOPICAL_CREAM | CUTANEOUS | Status: DC | PRN

## 2021-03-12 MED ORDER — RENA-VITE PO TABS
1.0000 | ORAL_TABLET | Freq: Every day | ORAL | Status: DC
  Administered 2021-03-12 – 2021-03-18 (×7): 1 via ORAL
  Filled 2021-03-12 (×7): qty 1

## 2021-03-12 MED ORDER — SEVELAMER CARBONATE 800 MG PO TABS
1600.0000 mg | ORAL_TABLET | Freq: Three times a day (TID) | ORAL | Status: DC
  Administered 2021-03-13 – 2021-03-18 (×11): 1600 mg via ORAL
  Filled 2021-03-12 (×11): qty 2

## 2021-03-12 MED ORDER — DOCUSATE SODIUM 283 MG RE ENEM
1.0000 | ENEMA | RECTAL | Status: DC | PRN
  Filled 2021-03-12: qty 1

## 2021-03-12 MED ORDER — SORBITOL 70 % SOLN: 30.0000 mL | Status: DC | PRN

## 2021-03-12 MED ORDER — OXYCODONE-ACETAMINOPHEN 5-325 MG PO TABS: 1.0000 | ORAL_TABLET | Freq: Once | ORAL | Status: DC | PRN

## 2021-03-12 MED ORDER — INSULIN ASPART 100 UNIT/ML IJ SOLN
0.0000 [IU] | Freq: Three times a day (TID) | INTRAMUSCULAR | Status: DC
  Administered 2021-03-13: 1 [IU] via SUBCUTANEOUS
  Administered 2021-03-14: 2 [IU] via SUBCUTANEOUS
  Administered 2021-03-15 – 2021-03-16 (×3): 1 [IU] via SUBCUTANEOUS
  Administered 2021-03-18: 2 [IU] via SUBCUTANEOUS

## 2021-03-12 MED ORDER — CARVEDILOL 3.125 MG PO TABS
25.0000 mg | ORAL_TABLET | Freq: Once | ORAL | Status: AC
  Administered 2021-03-12: 25 mg via ORAL
  Filled 2021-03-12: qty 8

## 2021-03-12 MED ORDER — ONDANSETRON HCL 4 MG/2ML IJ SOLN: 4.0000 mg | Freq: Four times a day (QID) | INTRAMUSCULAR | Status: DC | PRN

## 2021-03-12 MED ORDER — INSULIN GLARGINE-YFGN 100 UNIT/ML ~~LOC~~ SOLN
16.0000 [IU] | Freq: Every day | SUBCUTANEOUS | Status: DC
  Administered 2021-03-12 – 2021-03-18 (×6): 16 [IU] via SUBCUTANEOUS
  Filled 2021-03-12 (×7): qty 0.16

## 2021-03-12 NOTE — ED Provider Notes (Signed)
[name] Provider Note   CSN: [id number] Arrival date & time: 03/11/21  0249     History Chief Complaint  Patient presents with   Emesis    [name] is a 35 y.o. male presented emergency department after dialysis overnight with concern for possible hypoxia.  Patient was seen and evaluated in the emergency department yesterday for possible hematemesis.  He had a CT scan of the abdomen pelvis performed which showed no acute intra-abdominal pathology but did note extensive groundglass opacities in the lung bases and cardiomegaly with small pericardial effusion.  He also had requested ultrasound of the gallbladder which noted some mild biliary ductal dilatation to 8 mm with no stone, mild ascites, no other stigmata for acute cholecystitis.  He was admitted for dialysis overnight and completed that.  At dialysis he was reported to be hypoxic requiring 3L [place], and sent to the ED. Here in the ED he reports that he does not feel short of breath.  He reports he is hungry.  He is exhausted from his long stay.  Blood test yesterday were notable for normal Greek blood cell count, negative COVID and flu, hgb stable at 8.9.  HPI     Past Medical History:  Diagnosis Date   Diabetes mellitus    ESRD on hemodialysis ([place])    High cholesterol    Hypertension    Hypertrophic cardiomyopathy ([place]) 02/26/2021    Patient Active Problem List   Diagnosis Date Noted   Acute respiratory failure with hypoxia ([place]) 03/12/2021   Hypertrophic cardiomyopathy ([place]) 02/26/2021   Syncope 02/08/2021   DKA (diabetic ketoacidosis) ([place]) 02/08/2021   Seizure-like activity ([place])    Hyperglycemia    Acute hypoxemic respiratory failure ([place]) 11/25/2020   Diabetic foot ulcer ([place]) 11/24/2020   Hypervolemia associated with renal insufficiency 08/07/2020   Uremia 123XX123   Metabolic acidosis 123XX123   Hyperkalemia 08/07/2020   Nausea and vomiting 05/07/2020    Anemia of chronic kidney failure 05/07/2020   Dehydration    Hypertensive urgency 02/25/2020   Acute renal failure superimposed on stage 4 chronic kidney disease ([place]) 12/02/2019   Anemia 12/02/2019   Essential hypertension 12/02/2019   SOB (shortness of breath) 12/02/2019   DM2 (diabetes mellitus, type 2) ([place]) 12/02/2019   Diarrhea 03/23/2017   Early satiety 03/23/2017   Generalized abdominal pain 03/23/2017    Past Surgical History:  Procedure Laterality Date   AV FISTULA PLACEMENT Left 08/11/2020   Procedure: LEFT UPPER EXTREMITY ARTERIOVENOUS (AV) FISTULA CREATION;  Surgeon: Cherre Robins, MD;  Location: [facility];  Service: Vascular;  Laterality: Left;   FRACTURE SURGERY     I & D EXTREMITY Left 07/16/2014   Procedure: IRRIGATION AND DEBRIDEMENT EXTREMITY/LEFT INDEX FINGER;  Surgeon: Leanora Cover, MD;  Location: [facility];  Service: Orthopedics;  Laterality: Left;   IR PERC TUN PERIT CATH WO PORT S&I /IMAG  08/07/2020   IR US GUIDE VASC ACCESS RIGHT  08/07/2020       Family History  Problem Relation Age of Onset   Cancer Mother    Stroke Father    Diabetes Father    Hypertension Father     Social History   Tobacco Use   Smoking status: Never   Smokeless tobacco: Never  Vaping Use   Vaping Use: Never used  Substance Use Topics   Alcohol use: Not Currently    Comment: social drinker   Drug use: No    Home Medications  Prior to Admission medications   Medication Sig Start Date End Date Taking? Authorizing Provider  acetaminophen (TYLENOL) 325 MG tablet Take 2 tablets (650 mg total) by mouth every 4 (four) hours as needed for headache or mild pain. 03/05/21  Yes Samella Parr, NP  atorvastatin (LIPITOR) 40 MG tablet TAKE 1 TABLET (40 MG TOTAL) BY MOUTH DAILY. Patient taking differently: Take 40 mg by mouth at bedtime. 09/06/20 09/06/21 Yes Gildardo Pounds, NP  carvedilol (COREG) 25 MG tablet Take 1 tablet (25 mg total) by mouth 2 (two) times daily with a meal. 03/05/21  Yes  Samella Parr, NP  doxercalciferol ([name]) 4 MCG/2ML injection Inject 2.5 mLs (5 mcg total) into the vein every Monday, Wednesday, and Friday with hemodialysis. 03/06/21  Yes Samella Parr, NP  epoetin alfa-epbx (RETACRIT) 96295 UNIT/ML injection Inject 20,000 Units into the vein every Monday.   Yes [provider]  imipramine (TOFRANIL) 25 MG tablet Take 1 tablet (25 mg total) by mouth at bedtime. 03/05/21  Yes Samella Parr, NP  insulin glargine-yfgn (SEMGLEE) 100 UNIT/ML injection Inject 0.16 mLs (16 Units total) into the skin at bedtime. 03/05/21  Yes Samella Parr, NP  insulin lispro (HUMALOG) 100 UNIT/ML injection Inject 1-7 Units into the skin See admin instructions. Per sliding scale 3 times daily with meals 151-200= 1 unit 201-250= 2 units 251-300= 3 units 301-350= 5 units 351-400= 7 units Greater than 400 call md   Yes [provider]  losartan (COZAAR) 50 MG tablet Take 1 tablet (50 mg total) by mouth at bedtime. 03/05/21  Yes Samella Parr, NP  multivitamin (RENA-VIT) TABS tablet Take 1 tablet by mouth at bedtime. 11/27/20  Yes Lavina Hamman, MD  sevelamer carbonate (RENVELA) 800 MG tablet Take 2 tablets (1,600 mg total) by mouth 3 (three) times daily with meals. 03/05/21  Yes Samella Parr, NP  Darbepoetin Alfa (ARANESP) 60 MCG/0.3ML SOSY injection Inject 0.3 mLs (60 mcg total) into the vein every Monday with hemodialysis. Patient not taking: Reported on 03/12/2021 03/09/21   Samella Parr, NP  hydrOXYzine (ATARAX/VISTARIL) 25 MG tablet Take 1 tablet (25 mg total) by mouth 3 (three) times daily as needed for itching. Patient not taking: Reported on 03/12/2021 11/27/20   Lavina Hamman, MD  insulin aspart (NOVOLOG) 100 UNIT/ML injection Inject 0-6 Units into the skin 3 (three) times daily with meals. Do NOT hold if patient is NPO. Patient not taking: Reported on 03/12/2021 03/05/21   Samella Parr, NP  insulin aspart (NOVOLOG) 100 UNIT/ML injection  Inject 0-5 Units into the skin at bedtime. Do NOT hold insulin if patient is NPO. Patient not taking: Reported on 03/12/2021 03/05/21   Samella Parr, NP  insulin aspart (NOVOLOG) 100 UNIT/ML injection Inject 3 Units into the skin 3 (three) times daily with meals. Patient not taking: Reported on 03/12/2021 03/05/21   Samella Parr, NP    Allergies    Amlodipine and Hydralazine  Review of Systems   Review of Systems  Constitutional:  Negative for chills and fever.  HENT:  Negative for ear pain and sore throat.   Eyes:  Negative for pain and visual disturbance.  Respiratory:  Negative for cough and shortness of breath.   Cardiovascular:  Negative for chest pain and palpitations.  Gastrointestinal:  Positive for abdominal pain, nausea and vomiting.  Genitourinary:  Negative for dysuria and hematuria.  Musculoskeletal:  Negative for arthralgias and back pain.  Skin:  Negative for color change and rash.  Neurological:  Negative for seizures and syncope.  All other systems reviewed and are negative.  Physical Exam Updated Vital Signs BP (!) 169/93 (BP Location: Right Arm)   Pulse 86   Temp 98.3 F (36.8 C) (Oral)   Resp 20   SpO2 96%   Physical Exam Constitutional:      General: He is not in acute distress. HENT:     Head: Normocephalic and atraumatic.  Eyes:     Conjunctiva/sclera: Conjunctivae normal.     Pupils: Pupils are equal, round, and reactive to light.  Cardiovascular:     Rate and Rhythm: Normal rate and regular rhythm.  Pulmonary:     Effort: Pulmonary effort is normal. No respiratory distress.     Comments: 94% on room air Abdominal:     General: There is no distension.     Tenderness: There is no abdominal tenderness.  Skin:    General: Skin is warm and dry.  Neurological:     General: No focal deficit present.     Mental Status: He is alert. Mental status is at baseline.  Psychiatric:        Mood and Affect: Mood normal.        Behavior: Behavior  normal.    ED Results / Procedures / Treatments   Labs (all labs ordered are listed, but only abnormal results are displayed) Labs Reviewed  CBC WITH DIFFERENTIAL/PLATELET - Abnormal; Notable for the following components:      Result Value   RBC 3.51 (*)    Hemoglobin 9.2 (*)    HCT 29.4 (*)    RDW 19.0 (*)    Lymphs Abs 0.6 (*)    All other components within normal limits  COMPREHENSIVE METABOLIC PANEL - Abnormal; Notable for the following components:   Chloride 97 (*)    Glucose, Bld 118 (*)    BUN 67 (*)    Creatinine, Ser 12.17 (*)    Calcium 8.8 (*)    ALT 61 (*)    Alkaline Phosphatase 202 (*)    GFR, Estimated 5 (*)    All other components within normal limits  CBC - Abnormal; Notable for the following components:   RBC 3.39 (*)    Hemoglobin 8.9 (*)    HCT 28.6 (*)    RDW 19.0 (*)    All other components within normal limits  RENAL FUNCTION PANEL - Abnormal; Notable for the following components:   Potassium 5.7 (*)    Chloride 95 (*)    BUN 82 (*)    Creatinine, Ser 13.59 (*)    Calcium 8.5 (*)    Phosphorus 6.0 (*)    Albumin 3.4 (*)    GFR, Estimated 4 (*)    Anion gap 16 (*)    All other components within normal limits  BASIC METABOLIC PANEL - Abnormal; Notable for the following components:   Chloride 95 (*)    Glucose, Bld 141 (*)    BUN 44 (*)    Creatinine, Ser 8.70 (*)    Calcium 8.7 (*)    GFR, Estimated 7 (*)    All other components within normal limits  BRAIN NATRIURETIC PEPTIDE - Abnormal; Notable for the following components:   B Natriuretic Peptide 4,361.1 (*)    All other components within normal limits  CBG MONITORING, ED - Abnormal; Notable for the following components:   Glucose-Capillary 143 (*)    All other components within normal  limits  I-STAT ARTERIAL BLOOD GAS, ED - Abnormal; Notable for the following components:   pO2, Arterial 33 (*)    Bicarbonate 29.4 (*)    Acid-Base Excess 4.0 (*)    Calcium, Ion 1.07 (*)    HCT 32.0  (*)    Hemoglobin 10.9 (*)    All other components within normal limits  TROPONIN I (HIGH SENSITIVITY) - Abnormal; Notable for the following components:   Troponin I (High Sensitivity) 33 (*)    All other components within normal limits  RESP PANEL BY RT-PCR (FLU A&B, COVID) ARPGX2  LIPASE, BLOOD  POC OCCULT BLOOD, ED  CBG MONITORING, ED  CBG MONITORING, ED  TYPE AND SCREEN    EKG None  Radiology CT ABDOMEN PELVIS W CONTRAST  Result Date: 03/11/2021 CLINICAL DATA:  Abdominal pain, acute, nonlocalized EXAM: CT ABDOMEN AND PELVIS WITH CONTRAST TECHNIQUE: Multidetector CT imaging of the abdomen and pelvis was performed using the standard protocol following bolus administration of intravenous contrast. CONTRAST:  65m OMNIPAQUE IOHEXOL 350 MG/ML SOLN COMPARISON:  Ultrasound 03/11/2021, limited abdominal CT 02/29/2020 FINDINGS: Lower chest: Extensive ground-glass opacity with patchy consolidation within the bilateral lung bases, right slightly worse than left. Cardiomegaly with small pericardial effusion. Hepatobiliary: Heterogeneous appearance of the liver. No focal liver lesion is identified. Unremarkable appearance of the gallbladder. No hyperdense gallstone. No intrahepatic biliary dilatation. Pancreas: Unremarkable. No pancreatic ductal dilatation or surrounding inflammatory changes. Spleen: Normal in size without focal abnormality. Adrenals/Urinary Tract: Unremarkable adrenal glands. Horseshoe kidney. No focal renal lesion, stone, or hydronephrosis. Urinary bladder is within normal limits. Stomach/Bowel: Stomach is within normal limits. Appendix appears normal (series 3, image 63). No evidence of bowel wall thickening, distention, or inflammatory changes. Vascular/Lymphatic: Normal caliber aorta. Small vessel calcifications are seen within the pelvis. Lymphadenopathy. Reproductive: Prostate is unremarkable. Other: Small to moderate volume ascites throughout the abdomen and pelvis. No organized  or rim enhancing fluid collections. No pneumoperitoneum. No abdominal wall hernia. Musculoskeletal: Diffuse anasarca.  No acute osseous abnormality. IMPRESSION: 1. Extensive ground-glass opacity with patchy consolidation within the bilateral lung bases, right slightly worse than left. Findings may reflect pulmonary edema, multifocal pneumonia, versus ARDS. 2. Cardiomegaly with small pericardial effusion. 3. Small to moderate volume ascites throughout the abdomen and pelvis. 4. Diffuse anasarca. 5. Heterogeneous appearance of the liver may reflect hepatic venous congestion, acute hepatitis, or possibly fatty infiltration. 6. Horseshoe kidney. 7. Age advanced atherosclerosis. Electronically Signed   By: NDavina PokeD.O.   On: 03/11/2021 13:05   DG Abdomen Acute W/Chest  Result Date: 03/11/2021 CLINICAL DATA:  Abdominal pain.  Vomiting blood EXAM: DG ABDOMEN ACUTE WITH 1 VIEW CHEST COMPARISON:  02/29/2020 FINDINGS: Formed stool is seen in the colon. No concerning mass effect or calcification. No gas dilated bowel. Cardiomegaly and vascular pedicle widening with diffuse hazy pulmonary opacity asymmetric to the right lung. No effusion or pneumothorax. IMPRESSION: 1. Negative abdominal radiograph. 2. Airspace disease asymmetric to the right lung, history suggesting edema or aspiration. Electronically Signed   By: JMonte FantasiaM.D.   On: 03/11/2021 04:12   [place]domen Limited RUQ (LIVER/GB)  Result Date: 03/11/2021 CLINICAL DATA:  Right upper quadrant pain EXAM: ULTRASOUND ABDOMEN LIMITED RIGHT UPPER QUADRANT COMPARISON:  CT abdomen/pelvis 02/29/2020 FINDINGS: Gallbladder: No gallstones or wall thickening visualized. No sonographic Murphy sign noted by sonographer. Common bile duct: Diameter: 8 mm. There is no intrahepatic biliary ductal dilatation. No obstructing lesion or stone is seen. Liver: No focal lesion identified. Within normal  limits in parenchymal echogenicity. Portal vein is patent on color  Doppler imaging with normal direction of blood flow towards the liver. Other: There is mild ascites. IMPRESSION: 1. Mild biliary ductal dilatation measuring up to 8 mm. No stone or other obstructing lesion is seen. Correlate with LFTs, and MRCP may be considered if indicated. 2. Mild ascites. Electronically Signed   By: Valetta Mole M.D.   On: 03/11/2021 10:15    Procedures Procedures   Medications Ordered in ED Medications  nitroGLYCERIN (NITROGLYN) 2 % ointment 1 inch (1 inch Topical Patient Refused/Not Given 03/12/21 0305)  Chlorhexidine Gluconate Cloth 2 % PADS 6 each (6 each Topical Given 03/12/21 1206)  oxyCODONE-acetaminophen (PERCOCET/ROXICET) 5-325 MG per tablet 1 tablet (has no administration in time range)  COVID-19 mRNA Vac-TriS (Pf[employer]) injection 0.3 mL (has no administration in time range)  ondansetron (ZOFRAN) injection 4 mg (4 mg Intravenous Given 03/11/21 0904)  iohexol (OMNIPAQUE) 350 MG/ML injection 80 mL (80 mLs Intravenous Contrast Given 03/11/21 1222)  carvedilol (COREG) tablet 25 mg (25 mg Oral Given 03/12/21 0302)  losartan (COZAAR) tablet 50 mg (50 mg Oral Given 03/12/21 0302)    ED Course  I have reviewed the triage vital signs and the nursing notes.  Pertinent labs & imaging results that were available during my care of the patient were reviewed by me and considered in my medical decision making (see chart for details).  Here s/p dialysis with new O2 requirement, hypoxia. CT abdomen earlier suggestive of bilateral patchy opacities.  Afebrile with normal WBC - less likely PNA, Covid negative, but question of pulm edema  Patient overwise well appearing on exam  I ordered, reviewed, and interpreted labs.  Post dialysis BMP with K 4.1, Na 136.  BNP elevated (>4500K 1 month ago).  Trop 33.  Covid negative.  Hgb stable.  Reviewed prior ED/abdominal pain evaluation and scans.    Clinical Course as of 03/12/21 1725  Thu Mar 12, 2021  1241 Patient desaturated to 76% when  taken off of oxygen, placed back on 3 L and stabilized.  Given his new oxygen requirement he will be readmitted to the hospital [MT]    Clinical Course User Index [MT] Elease Swarm, Carola Rhine, MD    Final Clinical Impression(s) / ED Diagnoses Final diagnoses:  Right upper quadrant pain  Hematemesis without nausea  Hypoxia    Rx / DC Orders ED Discharge Orders     None        Wyvonnia Dusky, MD 03/12/21 1726

## 2021-03-12 NOTE — ED Notes (Signed)
Pt sitting with his head covered with a towel  No response whenever I walked into the room

## 2021-03-12 NOTE — H&P (Signed)
History and Physical    [name] [id number] DOB: 08-May-1986 DOA: 03/11/2021  PCP: Gildardo Pounds, NP Consultants:  Foot doctor Patient coming from: [facility]; NOK: Brother, [phone]  Chief Complaint: Hematemesis  HPI: [name] is a 35 y.o. male with medical history significant of pseudoseizures; HTN; ESRD on MWF HD; DM; and diabetic foot ulcer presenting with ?hypoxia from HD after having been seen in the ER earlier in the day for hematemesis.  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  "It's a terrible place."  He is really unhappy with it.  Wednesday AM, he vomited and tasted blood.  He vomited blood again later in the AM and they sent him to the ER.  He was found to have elevated BP and low oxygen.  He usually does 4h at HD and he had 3 hours today.  He has not been feeling SOB but has been minimally mobile.  No cough.  He has a skin ulcer that won't heal; it itches and he scratches it.    ED Course: New hypoxia.  Brought into ED yesterday with hematemesis.  Was found to be overloaded.  Went to HD, and O2 sats dropped into 70s.  Sent back to ED and it is new - on 3-4L after completion of HD.  Unsure what is triggering.  Review of Systems: As per HPI; otherwise review of systems reviewed and negative.   Ambulatory Status:  Ambulates without assistance  COVID Vaccine Status:  None  Past Medical History:  Diagnosis Date   Diabetes mellitus    ESRD on hemodialysis ([place])    High cholesterol    Hypertension    Hypertrophic cardiomyopathy ([place]) 02/26/2021    Past Surgical History:  Procedure Laterality Date   AV FISTULA PLACEMENT Left 08/11/2020   Procedure: LEFT UPPER EXTREMITY ARTERIOVENOUS (AV) FISTULA CREATION;  Surgeon: Cherre Robins, MD;  Location: [facility];  Service: Vascular;  Laterality: Left;   FRACTURE SURGERY     I & D EXTREMITY Left 07/16/2014   Procedure: IRRIGATION AND DEBRIDEMENT EXTREMITY/LEFT INDEX FINGER;   Surgeon: Leanora Cover, MD;  Location: [place];  Service: Orthopedics;  Laterality: Left;   IR PERC TUN PERIT CATH WO PORT S&I /IMAG  08/07/2020   IR US GUIDE VASC ACCESS RIGHT  08/07/2020    Social History   Socioeconomic History   Marital status: Single    Spouse name: Not on file   Number of children: Not on file   Years of education: Not on file   Highest education level: Not on file  Occupational History   Occupation: disabled  Tobacco Use   Smoking status: Never   Smokeless tobacco: Never  Vaping Use   Vaping Use: Never used  Substance and Sexual Activity   Alcohol use: Not Currently    Comment: social drinker   Drug use: No   Sexual activity: Yes  Other Topics Concern   Not on file  Social History Narrative   Not on file   Social Determinants of Health   Financial Resource Strain: Not on file  Food Insecurity: Not on file  Transportation Needs: Not on file  Physical Activity: Not on file  Stress: Not on file  Social Connections: Not on file  Intimate Partner Violence: Not on file    Allergies  Allergen Reactions   Amlodipine Nausea And Vomiting and Other (See Comments)    Patient was taking Amlodipine and  Hydralazine at the same time, so the reactions came from one of the 2: Lethargy and an all-over feeling of NOT feeling well (also)   Hydralazine Nausea And Vomiting and Other (See Comments)    Patient was taking Hydralazine AND Amlodipine at the same time, so the reactions came from one of the 2: Lethargy and an all-over feeling of NOT feeling well (also)    Family History  Problem Relation Age of Onset   Cancer Mother    Stroke Father    Diabetes Father    Hypertension Father     Prior to Admission medications   Medication Sig Start Date End Date Taking? Authorizing Provider  acetaminophen (TYLENOL) 325 MG tablet Take 2 tablets (650 mg total) by mouth every 4 (four) hours as needed for headache or mild pain. 03/05/21  Yes Samella Parr, NP   atorvastatin (LIPITOR) 40 MG tablet TAKE 1 TABLET (40 MG TOTAL) BY MOUTH DAILY. Patient taking differently: Take 40 mg by mouth at bedtime. 09/06/20 09/06/21 Yes Gildardo Pounds, NP  carvedilol (COREG) 25 MG tablet Take 1 tablet (25 mg total) by mouth 2 (two) times daily with a meal. 03/05/21  Yes Samella Parr, NP  doxercalciferol ([name]) 4 MCG/2ML injection Inject 2.5 mLs (5 mcg total) into the vein every Monday, Wednesday, and Friday with hemodialysis. 03/06/21  Yes Samella Parr, NP  epoetin alfa-epbx (RETACRIT) 76160 UNIT/ML injection Inject 20,000 Units into the vein every Monday.   Yes [provider]  imipramine (TOFRANIL) 25 MG tablet Take 1 tablet (25 mg total) by mouth at bedtime. 03/05/21  Yes Samella Parr, NP  insulin glargine-yfgn (SEMGLEE) 100 UNIT/ML injection Inject 0.16 mLs (16 Units total) into the skin at bedtime. 03/05/21  Yes Samella Parr, NP  insulin lispro (HUMALOG) 100 UNIT/ML injection Inject 1-7 Units into the skin See admin instructions. Per sliding scale 3 times daily with meals 151-200= 1 unit 201-250= 2 units 251-300= 3 units 301-350= 5 units 351-400= 7 units Greater than 400 call md   Yes [provider]  losartan (COZAAR) 50 MG tablet Take 1 tablet (50 mg total) by mouth at bedtime. 03/05/21  Yes Samella Parr, NP  multivitamin (RENA-VIT) TABS tablet Take 1 tablet by mouth at bedtime. 11/27/20  Yes Lavina Hamman, MD  sevelamer carbonate (RENVELA) 800 MG tablet Take 2 tablets (1,600 mg total) by mouth 3 (three) times daily with meals. 03/05/21  Yes Samella Parr, NP  Darbepoetin Alfa (ARANESP) 60 MCG/0.3ML SOSY injection Inject 0.3 mLs (60 mcg total) into the vein every Monday with hemodialysis. Patient not taking: Reported on 03/12/2021 03/09/21   Samella Parr, NP  hydrOXYzine (ATARAX/VISTARIL) 25 MG tablet Take 1 tablet (25 mg total) by mouth 3 (three) times daily as needed for itching. Patient not taking: Reported on  03/12/2021 11/27/20   Lavina Hamman, MD  insulin aspart (NOVOLOG) 100 UNIT/ML injection Inject 0-6 Units into the skin 3 (three) times daily with meals. Do NOT hold if patient is NPO. Patient not taking: Reported on 03/12/2021 03/05/21   Samella Parr, NP  insulin aspart (NOVOLOG) 100 UNIT/ML injection Inject 0-5 Units into the skin at bedtime. Do NOT hold insulin if patient is NPO. Patient not taking: Reported on 03/12/2021 03/05/21   Samella Parr, NP  insulin aspart (NOVOLOG) 100 UNIT/ML injection Inject 3 Units into the skin 3 (three) times daily with meals. Patient not taking: Reported on 03/12/2021 03/05/21   Lissa Merlin,  Leisa Lenz, NP    Physical Exam: Vitals:   03/12/21 1245 03/12/21 1630 03/12/21 1820 03/12/21 1821  BP: (!) 170/108 (!) 169/93 (!) 158/94   Pulse: 85 86 84   Resp: '18 20 15   '$ Temp:  98.3 F (36.8 C) 99.4 F (37.4 C)   TempSrc:  Oral    SpO2: 96% 96% 91%   Weight:    95.3 kg     General:  Appears calm and comfortable and is in NAD, agitated Eyes:  visual impairment, normal lids, iris ENT:  grossly normal hearing, lips & tongue, mmm Neck:  no LAD, masses or thyromegaly Cardiovascular:  RRR, no m/r/g. No LE edema.  Respiratory:   Bibasilar crackles.  Normal respiratory effort. Abdomen:  soft, NT, ND Skin:  R third toe with superficial ulceration along the lateral distal margin with mild anterior excoriation as well        Musculoskeletal:  grossly normal tone BUE/BLE, good ROM, no bony abnormality Lower extremity:  No LE edema.  Limited foot exam with no ulcerations other than as described above.  2+ distal pulses. Psychiatric:  frustrated mood and affect, speech fluent and appropriate, AOx3 Neurologic:  CN 2-12 grossly intact, moves all extremities in coordinated fashion    Radiological Exams on Admission: Independently reviewed - see discussion in A/P where applicable  CT ABDOMEN PELVIS W CONTRAST  Result Date: 03/11/2021 CLINICAL DATA:  Abdominal pain,  acute, nonlocalized EXAM: CT ABDOMEN AND PELVIS WITH CONTRAST TECHNIQUE: Multidetector CT imaging of the abdomen and pelvis was performed using the standard protocol following bolus administration of intravenous contrast. CONTRAST:  30m OMNIPAQUE IOHEXOL 350 MG/ML SOLN COMPARISON:  Ultrasound 03/11/2021, limited abdominal CT 02/29/2020 FINDINGS: Lower chest: Extensive ground-glass opacity with patchy consolidation within the bilateral lung bases, right slightly worse than left. Cardiomegaly with small pericardial effusion. Hepatobiliary: Heterogeneous appearance of the liver. No focal liver lesion is identified. Unremarkable appearance of the gallbladder. No hyperdense gallstone. No intrahepatic biliary dilatation. Pancreas: Unremarkable. No pancreatic ductal dilatation or surrounding inflammatory changes. Spleen: Normal in size without focal abnormality. Adrenals/Urinary Tract: Unremarkable adrenal glands. Horseshoe kidney. No focal renal lesion, stone, or hydronephrosis. Urinary bladder is within normal limits. Stomach/Bowel: Stomach is within normal limits. Appendix appears normal (series 3, image 63). No evidence of bowel wall thickening, distention, or inflammatory changes. Vascular/Lymphatic: Normal caliber aorta. Small vessel calcifications are seen within the pelvis. Lymphadenopathy. Reproductive: Prostate is unremarkable. Other: Small to moderate volume ascites throughout the abdomen and pelvis. No organized or rim enhancing fluid collections. No pneumoperitoneum. No abdominal wall hernia. Musculoskeletal: Diffuse anasarca.  No acute osseous abnormality. IMPRESSION: 1. Extensive ground-glass opacity with patchy consolidation within the bilateral lung bases, right slightly worse than left. Findings may reflect pulmonary edema, multifocal pneumonia, versus ARDS. 2. Cardiomegaly with small pericardial effusion. 3. Small to moderate volume ascites throughout the abdomen and pelvis. 4. Diffuse anasarca. 5.  Heterogeneous appearance of the liver may reflect hepatic venous congestion, acute hepatitis, or possibly fatty infiltration. 6. Horseshoe kidney. 7. Age advanced atherosclerosis. Electronically Signed   By: NDavina PokeD.O.   On: 03/11/2021 13:05   DG Abdomen Acute W/Chest  Result Date: 03/11/2021 CLINICAL DATA:  Abdominal pain.  Vomiting blood EXAM: DG ABDOMEN ACUTE WITH 1 VIEW CHEST COMPARISON:  02/29/2020 FINDINGS: Formed stool is seen in the colon. No concerning mass effect or calcification. No gas dilated bowel. Cardiomegaly and vascular pedicle widening with diffuse hazy pulmonary opacity asymmetric to the right lung. No effusion or  pneumothorax. IMPRESSION: 1. Negative abdominal radiograph. 2. Airspace disease asymmetric to the right lung, history suggesting edema or aspiration. Electronically Signed   By: Monte Fantasia M.D.   On: 03/11/2021 04:12   US Abdomen Limited RUQ (LIVER/GB)  Result Date: 03/11/2021 CLINICAL DATA:  Right upper quadrant pain EXAM: ULTRASOUND ABDOMEN LIMITED RIGHT UPPER QUADRANT COMPARISON:  CT abdomen/pelvis 02/29/2020 FINDINGS: Gallbladder: No gallstones or wall thickening visualized. No sonographic Murphy sign noted by sonographer. Common bile duct: Diameter: 8 mm. There is no intrahepatic biliary ductal dilatation. No obstructing lesion or stone is seen. Liver: No focal lesion identified. Within normal limits in parenchymal echogenicity. Portal vein is patent on color Doppler imaging with normal direction of blood flow towards the liver. Other: There is mild ascites. IMPRESSION: 1. Mild biliary ductal dilatation measuring up to 8 mm. No stone or other obstructing lesion is seen. Correlate with LFTs, and MRCP may be considered if indicated. 2. Mild ascites. Electronically Signed   By: Valetta Mole M.D.   On: 03/11/2021 10:15    EKG: not done   Labs on Admission: I have personally reviewed the available labs and imaging studies at the time of the  admission.  Pertinent labs:   K+ 5.7 BUN 82/Creatinine 13.59/GFR 4 Anion gap 16 WBC 6.4 Hgb 8.9 COVID/flu negative   Assessment/Plan Principal Problem:   Acute respiratory failure with hypoxia (HCC) Active Problems:   Essential hypertension   DM2 (diabetes mellitus, type 2) (HCC)   Hypervolemia associated with renal insufficiency   Hypertrophic cardiomyopathy (HCC)   ESRD (end stage renal disease) on dialysis (HCC)   Acute hypoxic respiratory failure -Patient who presented 24 hours ago with hematemesis (see below) that has resolved, but even after HD he has been hypoxic for unclear cause -He did only receive 3 hours of HD rather than 4, so persistent volume overload is the most likely cause -Chest CT with extensive ground-glass opacity with patchy consolidation within the bilateral lung bases, right slightly worse than left. Findings may reflect pulmonary edema, multifocal pneumonia, versus ARDS. He also had a small pericardial effusion and small to moderate ascites - all of which suggests that he was pretty significantly volume overloaded -Will admit, anticipate need for serial HD -Will monitor on telemetry for now  Hematemesis -Appears to have resolved -Uncertain etiology -Abd CT was not particularly revealing -Will monitor for recurrence  ESRD  -Patient on chronic MWF HD -Nephrology prn order set utilized -Nephrology is aware that patient will need serial HD  -Continue [name] with HD -Continue Renvela and renavite  DM -A1c was 12.9 on 7/31 -Continue glargine -Cover with very sensitive-scale SSI   Chronic systolic CHF -[id number] echo with EF 45-50% -This is likely contributing to his volume overload -Volume management with HD  HTN -Continue Coreg, Cozaar  HLD -Continue Lipitor  Toe ulceration -Appears to be superficial, likely exacerbated by picking -Wound care consult requested  Other -Patient is legally blind and needs assistance accordingly -He has  been very unhappy with [place] but appears to understand the need to return there post-hospitalization     Note: This patient has been tested and is negative for the novel coronavirus COVID-19. The patient has NOT been vaccinated against COVID-19 and requests to receive the vaccine while hospitalized if possible.   Level of care: Telemetry Medical DVT prophylaxis: SCDs Code Status:  Full - confirmed with patient Family Communication: None present; I attempted to reach his brother by telephone at the time of admission. Disposition Plan:  The patient is from: SNF  Anticipated d/c is to: SNF  Anticipated d/c date will depend on clinical response to treatment, likely 2-3 days  Patient is currently: acutely ill Consults called: Nephrology; Nutrition; Wound care; TOC team  Admission status:  Admit - It is my clinical opinion that admission to INPATIENT is reasonable and necessary because of the expectation that this patient will require hospital care that crosses at least 2 midnights to treat this condition based on the medical complexity of the problems presented.  Given the aforementioned information, the predictability of an adverse outcome is felt to be significant.    Karmen Bongo MD [facility]   How to contact the [facility] Attending or Consulting provider [place] or covering provider during after hours [place], for this patient?  Check the care team in [facility] and look for a) attending/consulting [facility] provider listed and b) the [facility] team listed Log into [email or web address] and use [place]'s universal password to access. If you do not have the password, please contact the hospital operator. Locate the [facility] provider you are looking for under [facility] and page to a number that you can be directly reached. If you still have difficulty reaching the provider, please page the [facility] (Director on Call) for the Hospitalists listed on amion for assistance.   03/12/2021, 6:26 PM

## 2021-03-12 NOTE — ED Notes (Signed)
EKG leads placed back on pt. Pt O2 RA 76%. Notified MD and placed pt on 2L via [place]

## 2021-03-12 NOTE — ED Notes (Signed)
Introduced myself to pt. Pt AxO x4. VSS. GCS 15. Pt c/o intermittent R hip pain. I told pt he has a room upstairs. Given a diet gingerale. Denies further needs.

## 2021-03-12 NOTE — ED Notes (Signed)
Pt transported to floor via RN via stretcher.

## 2021-03-12 NOTE — ED Notes (Signed)
Patient bloodsugar is '88mg'$ . Spoke to dr.cardoma about to pt.bloodsugar '88mg'$  he stated to pt.a happy meal .so bloodsugar would not drop.

## 2021-03-12 NOTE — ED Notes (Signed)
Checked patient cbg it was 76 notified RN of blood sugar

## 2021-03-12 NOTE — ED Notes (Addendum)
Pt moved to rm 40 to await PTAR, when updating pt vitals pt O2 in 70s-80s with good pleth. Pt placed on [place]. Pt reporting no SOB or CP. PTAR then arrives. MD Cardama made aware and at bedside to assess pt. Charge RN made aware

## 2021-03-12 NOTE — ED Notes (Signed)
Received report from HD RN. Per report 3345m fluid pulled off and pt received 3hr of tx instead of 4hr. BP 180/100. Pt noted to be 84% on RA w/ HD RN and RN placed pt on 3L [place] to maintain sats. Pt to be transported back to ED shortly.

## 2021-03-12 NOTE — ED Provider Notes (Signed)
I was called into patient's room for hypoxia.  The patient's presenting earlier today for emesis.  Work-up was reassuring except for a CT scan of the abdomen showing pulmonary edema versus less likely pneumonia/ARDS.  Patient was satting well and had no acute process.  He was discharged and instructed to go to dialysis as soon as possible.  Patient was waiting since this afternoon for transport by PTR due to blindness.  Prior to transport, patient noted to be hypoxic with sats in the 70s.  Good Pleth and pulse ox reading.  On examination patient has no acute respiratory distress but does have bibasilar rales. Likely pulmonary edema from volume overload.  Patient states that he has dialysis MWF.  Did not have dialysis today as he was here in the emergency department.  Patient placed on 3 L nasal cannula. Will require emergent dialysis. Paged to nephrology.  .Critical Care  Date/Time: 03/12/2021 2:49 AM Performed by: Fatima Blank, MD Authorized by: Fatima Blank, MD   Critical care provider statement:    Critical care time (minutes):  30   Critical care was necessary to treat or prevent imminent or life-threatening deterioration of the following conditions:  Respiratory failure   Critical care was time spent personally by me on the following activities:  Discussions with consultants, evaluation of patient's response to treatment, examination of patient, ordering and performing treatments and interventions, ordering and review of laboratory studies, ordering and review of radiographic studies, pulse oximetry, re-evaluation of patient's condition, obtaining history from patient or surrogate and review of old charts    I spoke with Dr. Augustin Coupe of nephrology service who will schedule the patient for emergent dialysis first thing in the morning.  Up on dialysis patient can be discharged back to his facility.     Fatima Blank, MD 03/12/21 [id number]

## 2021-03-12 NOTE — ED Notes (Signed)
Waiting for transport

## 2021-03-12 NOTE — ED Notes (Signed)
The pt has pulled off all his ekg electrodes his bp cuff and his pulse ox  hes demanding to order his own breakfast

## 2021-03-12 NOTE — ED Notes (Signed)
When rounding on this patient MR.Mcelhiney WAS VERY UP SET THAT HE CAN NOT ORDER HIS BREAKFAST HE WAS EXPLAINED THAT HE HAS TO HAVE A DIET ORDER.HE IS A RENEAL PATIENT ,AND HAS A SPECIAL ORDER .HE IS MAD UPSET THAT ERICKA RN TOLD HIM EARLIER THAT HIS BLOODSUGAR WAS HIGH HE DID NOT NEED TAKE A SHOWER.THAT WAS UNSAFE.PATIENT CALL DOWN STAIRS TO CATH,TO ORDER HIS BREAKFAST THERE WASN'T  BREAKFAST IN PLACE YET .PATIENT IS UP SET WITH STAFF.PATIENT IS WAITING ON ADMISSION ORDER FOR BED PLACEMENT.HE WAS TALKING DOWN TO STAFF WITH .AN ATTITUDE.TOOK OFF LEADS AND OFF MONITOR,CHRIS  C .RN HAS CALL THE DOCTOR ,

## 2021-03-12 NOTE — ED Notes (Addendum)
Pt transported to dialysis at this time. Pt [place] in floor and refusing to put it back on

## 2021-03-12 NOTE — Progress Notes (Signed)
Patient admitted to 5 MW this afternoon. No complaints of pain, showered and resting in bed with call light within reach. Angie Fava

## 2021-03-12 NOTE — ED Notes (Signed)
Pt back in ED from HD

## 2021-03-12 NOTE — Consult Note (Addendum)
Reason for Consult: ESRD Referring Physician:  Dr. Addison Lank  Chief Complaint: Shortness of breath  OP HD: [facility]  Dialysis Orders: [facility] MWF 4 hrs 180NRe 450/800 86.5 kg 2.0 K/2.0 Ca L AVF -Heparin-none -[name] 5 mcg IV TIW -Venofer 100 mcg IV X 10 doses 3/7 doses given last dose 02/04/21 -Mircera 225 mcg IV q 2 weeks (last dose 02/02/2021) Lt Cimino  Assessment/Plan: Volume overload - 4 L UF last HD 8/29 @ [facility] and 8kg up on presumed EDW; has some abdominal wall edema, needs standing wts and HD on Friday as well for UF w/  strict fluid restriction. Will dialyze with max UF as tolerated; he may need EDW adjusted but need to try to get to presumed EDW and adjust based on response. Hypertrophic CM - new Dx recently. Acute systolic HF-EF  [id number]  ESRD - on HD MWF.  dialyze 1st shift this am. HHS/DMT2: sp IV insulin on SQ insulin now AMS: resolved, thought to be related to elevated glucose +/- seizure. CT of head without acute changes/MRI neg as well. Neuro involved no anti sz meds req COVID 19- Pt is off COVID isolation and COVID neg in the ED.  HTN:  Cont home meds. On Coreg 25 bid, Cozaar 50. UF with HD and continue to lower volume as tolerated.  Follow-up BP trend with UF on dialysis before raising BP meds Anemia  - Hgb 9.0 . Aranesp 60 q week started 8/15 -> also received 119mg on 8/29.  Metabolic bone disease -  Ca ok.  Phosphorus 6.0 recently increased Renvela to 2 tabs PO TID AC. Continue VDRA.  R toe ulcer  -Completed doxycycline  Nutrition -Renal Carb mod diet, on protein supplements, renal vits. Disposition -   Prev living with aunt but unable to care for him and was d/c to SNF.    HPI: BMINOS HORAKis an 35y.o. male IDDM HTN HLD legally blind ESRD recently admitted with syncope vs seizure (neg CT of the head) + hyperglycemic, thought to have had seizures from the elevated blood glucose levels + incidental finding of COVID with no respiratory issues but did receive 1  dose of monoclonal antibody therapy at admission.  [facility] aunt was not able to take him back home. Patient receives HD at [facility] and his last treatment there was on 8/29 (Monday) leaving 8kg up on his EDW; he was d/c from the hospital on 8/25 and missed treatment on 8/26 (Friday). He's presenting with emesis but workup was negative except for a CT scan of the abd showing pulmonary edema vs less likely PNA/ARDS. Unfortunately while awaiting transport he was noted to be hypoxic with sats in the 70's tx w/ 3L O2 [place]. He  missed treatment yesterday + missed tx on Friday after being d/c last Thur; he  was found to be COVID neg in the ED. Abdominal films were negative but there was cardiomegaly and a hazy pulmonary opacity asymmetric to the right lung possibly either edema or aspiration.   ROS Pertinent items are noted in HPI.  Chemistry and CBC: Creatinine, Ser  Date/Time Value Ref Range Status  03/11/2021 03:32 AM 12.17 (H) 0.61 - 1.24 mg/dL Final  03/04/2021 03:11 PM 10.51 (H) 0.61 - 1.24 mg/dL Final  03/02/2021 07:45 AM 10.82 (H) 0.61 - 1.24 mg/dL Final  03/01/2021 01:17 AM 8.94 (H) 0.61 - 1.24 mg/dL Final  02/27/2021 08:40 AM 9.97 (H) 0.61 - 1.24 mg/dL  Final  02/23/2021 09:09 AM 12.42 (H) 0.61 - 1.24 mg/dL Final  02/20/2021 09:00 AM 11.94 (H) 0.61 - 1.24 mg/dL Final  02/18/2021 02:59 AM 8.22 (H) 0.61 - 1.24 mg/dL Final  02/17/2021 04:23 PM 11.87 (H) 0.61 - 1.24 mg/dL Final  02/16/2021 06:54 AM 9.99 (H) 0.61 - 1.24 mg/dL Final  02/15/2021 05:37 AM 7.78 (H) 0.61 - 1.24 mg/dL Final  02/14/2021 03:17 AM 9.72 (H) 0.61 - 1.24 mg/dL Final  02/13/2021 03:32 AM 8.25 (H) 0.61 - 1.24 mg/dL Final  02/12/2021 05:09 AM 11.37 (H) 0.61 - 1.24 mg/dL Final  02/11/2021 02:10 AM 10.07 (H) 0.61 - 1.24 mg/dL Final  02/10/2021 03:57 AM 15.01 (H) 0.61 - 1.24 mg/dL Final  02/09/2021 02:56 AM 13.43 (H) 0.61 - 1.24 mg/dL Final  02/09/2021 02:56 AM 13.58 (H) 0.61 - 1.24 mg/dL Final   02/08/2021 11:17 PM 13.06 (H) 0.61 - 1.24 mg/dL Final  02/08/2021 07:05 PM 13.13 (H) 0.61 - 1.24 mg/dL Final  02/08/2021 04:30 PM 13.19 (H) 0.61 - 1.24 mg/dL Final  02/08/2021 02:01 PM 13.60 (H) 0.61 - 1.24 mg/dL Final  02/08/2021 01:51 PM 13.19 (H) 0.61 - 1.24 mg/dL Final  12/23/2020 12:21 PM 8.38 (H) 0.61 - 1.24 mg/dL Final  11/27/2020 05:25 AM 8.03 (H) 0.61 - 1.24 mg/dL Final  11/26/2020 02:33 AM 9.01 (H) 0.61 - 1.24 mg/dL Final  11/25/2020 02:58 AM 13.35 (H) 0.61 - 1.24 mg/dL Final  11/25/2020 12:06 AM 13.26 (H) 0.61 - 1.24 mg/dL Final  11/24/2020 06:07 PM 12.60 (H) 0.61 - 1.24 mg/dL Final  09/16/2020 01:56 PM 6.09 (H) 0.76 - 1.27 mg/dL Final  08/11/2020 04:49 AM 8.89 (H) 0.61 - 1.24 mg/dL Final  08/09/2020 11:42 PM 8.43 (H) 0.61 - 1.24 mg/dL Final  08/09/2020 09:14 PM 8.53 (H) 0.61 - 1.24 mg/dL Final  08/09/2020 02:28 AM 10.18 (H) 0.61 - 1.24 mg/dL Final  08/08/2020 05:34 AM 10.32 (H) 0.61 - 1.24 mg/dL Final  08/07/2020 02:58 AM 9.77 (H) 0.61 - 1.24 mg/dL Final  08/06/2020 08:12 PM 9.90 (H) 0.61 - 1.24 mg/dL Final  05/10/2020 01:08 AM 6.46 (H) 0.61 - 1.24 mg/dL Final  05/09/2020 08:35 AM 6.48 (H) 0.61 - 1.24 mg/dL Final  05/08/2020 07:59 AM 6.50 (H) 0.61 - 1.24 mg/dL Final  05/07/2020 06:24 PM 6.50 (H) 0.61 - 1.24 mg/dL Final  03/10/2020 02:10 PM 4.44 (H) 0.61 - 1.24 mg/dL Final  03/01/2020 05:45 AM 4.52 (H) 0.61 - 1.24 mg/dL Final  02/29/2020 04:49 AM 4.30 (H) 0.61 - 1.24 mg/dL Final  02/28/2020 05:17 AM 4.57 (H) 0.61 - 1.24 mg/dL Final  02/27/2020 07:25 AM 4.42 (H) 0.61 - 1.24 mg/dL Final  02/26/2020 05:24 AM 4.28 (H) 0.61 - 1.24 mg/dL Final  02/25/2020 11:27 AM 4.26 (H) 0.61 - 1.24 mg/dL Final  12/05/2019 03:52 AM 2.73 (H) 0.61 - 1.24 mg/dL Final  12/04/2019 04:28 AM 2.72 (H) 0.61 - 1.24 mg/dL Final  12/03/2019 04:31 PM 2.53 (H) 0.61 - 1.24 mg/dL Final  12/02/2019 07:37 AM 2.58 (H) 0.61 - 1.24 mg/dL Final   Recent Labs  Lab 03/11/21 0332  NA 138  K 4.8  CL 97*  CO2  27  GLUCOSE 118*  BUN 67*  CREATININE 12.17*  CALCIUM 8.8*   Recent Labs  Lab 03/11/21 0332  WBC 6.4  NEUTROABS 5.1  HGB 9.2*  HCT 29.4*  MCV 83.8  PLT 207   Liver Function Tests: Recent Labs  Lab 03/11/21 0332  AST 28  ALT 61*  ALKPHOS 202*  BILITOT  1.2  PROT 7.5  ALBUMIN 3.6   Recent Labs  Lab 03/11/21 0332  LIPASE 26   No results for input(s): AMMONIA in the last 168 hours. Cardiac Enzymes: No results for input(s): CKTOTAL, CKMB, CKMBINDEX, TROPONINI in the last 168 hours. Iron Studies: No results for input(s): IRON, TIBC, TRANSFERRIN, FERRITIN in the last 72 hours. PT/INR: '@LABRCNTIP'$ (inr:5)  Xrays/Other Studies: ) Results for orders placed or performed during the hospital encounter of 03/11/21 (from the past 48 hour(s))  Type and screen     Status: None   Collection Time: 03/11/21  3:20 AM  Result Value Ref Range   ABO/RH(D) O POS    Antibody Screen NEG    Sample Expiration      03/14/2021,2359 Performed at [facility], [place] [address], [place], [place] [address]   CBC with Differential/Platelet     Status: Abnormal   Collection Time: 03/11/21  3:32 AM  Result Value Ref Range   WBC 6.4 4.0 - 10.5 K/uL   RBC 3.51 (L) 4.22 - 5.81 MIL/uL   Hemoglobin 9.2 (L) 13.0 - 17.0 g/dL   HCT 29.4 (L) 39.0 - 52.0 %   MCV 83.8 80.0 - 100.0 fL   MCH 26.2 26.0 - 34.0 pg   MCHC 31.3 30.0 - 36.0 g/dL   RDW 19.0 (H) 11.5 - 15.5 %   Platelets 207 150 - 400 K/uL   nRBC 0.0 0.0 - 0.2 %   Neutrophils Relative % 80 %   Neutro Abs 5.1 1.7 - 7.7 K/uL   Lymphocytes Relative 9 %   Lymphs Abs 0.6 (L) 0.7 - 4.0 K/uL   Monocytes Relative 9 %   Monocytes Absolute 0.6 0.1 - 1.0 K/uL   Eosinophils Relative 1 %   Eosinophils Absolute 0.1 0.0 - 0.5 K/uL   Basophils Relative 1 %   Basophils Absolute 0.0 0.0 - 0.1 K/uL   Immature Granulocytes 0 %   Abs Immature Granulocytes 0.02 0.00 - 0.07 K/uL    Comment: Performed at [facility], [place] [address]  [place], [place] [address]  Comprehensive metabolic panel     Status: Abnormal   Collection Time: 03/11/21  3:32 AM  Result Value Ref Range   Sodium 138 135 - 145 mmol/L   Potassium 4.8 3.5 - 5.1 mmol/L   Chloride 97 (L) 98 - 111 mmol/L   CO2 27 22 - 32 mmol/L   Glucose, Bld 118 (H) 70 - 99 mg/dL    Comment: Glucose reference range applies only to samples taken after fasting for at least 8 hours.   BUN 67 (H) 6 - 20 mg/dL   Creatinine, Ser 12.17 (H) 0.61 - 1.24 mg/dL   Calcium 8.8 (L) 8.9 - 10.3 mg/dL   Total Protein 7.5 6.5 - 8.1 g/dL   Albumin 3.6 3.5 - 5.0 g/dL   AST 28 15 - 41 U/L   ALT 61 (H) 0 - 44 U/L   Alkaline Phosphatase 202 (H) 38 - 126 U/L   Total Bilirubin 1.2 0.3 - 1.2 mg/dL   GFR, Estimated 5 (L) >60 mL/min    Comment: (NOTE) Calculated using the CKD-EPI Creatinine Equation (2021)    Anion gap 14 5 - 15    Comment: Performed at [facility], [place] [address], [place],  [address]  Lipase, blood     Status: None   Collection Time: 03/11/21  3:32 AM  Result Value Ref Range   Lipase 26 11 -  51 U/L    Comment: Performed at [facility], [place] [address], [place], [place] [address]  POC occult blood, ED     Status: None   Collection Time: 03/11/21  8:32 AM  Result Value Ref Range   Fecal Occult Bld NEGATIVE NEGATIVE  Resp Panel by RT-PCR (Flu A&B, Covid) Nasopharyngeal Swab     Status: None   Collection Time: 03/11/21  4:33 PM   Specimen: Nasopharyngeal Swab; Nasopharyngeal(NP) swabs in vial transport medium  Result Value Ref Range   SARS Coronavirus 2 by RT PCR NEGATIVE NEGATIVE    Comment: (NOTE) SARS-CoV-2 target nucleic acids are NOT DETECTED.  The SARS-CoV-2 RNA is generally detectable in upper respiratory specimens during the acute phase of infection. The lowest concentration of SARS-CoV-2 viral copies this assay can detect is 138 copies/mL. A negative result does not preclude SARS-Cov-2 infection and should not be used as the sole basis for  treatment or other patient management decisions. A negative result may occur with  improper specimen collection/handling, submission of specimen other than nasopharyngeal swab, presence of viral mutation(s) within the areas targeted by this assay, and inadequate number of viral copies(<138 copies/mL). A negative result must be combined with clinical observations, patient history, and epidemiological information. The expected result is Negative.  Fact Sheet for Patients:  EntrepreneurPulse.com.au  Fact Sheet for Healthcare Providers:  IncredibleEmployment.be  This test is no t yet approved or cleared by the [place] FDA and  has been authorized for detection and/or diagnosis of SARS-CoV-2 by FDA under an Emergency Use Authorization (EUA). This EUA will remain  in effect (meaning this test can be used) for the duration of the COVID-19 declaration under Section 564(b)(1) of the Act, 21 U.S.C.section 360bbb-3(b)(1), unless the authorization is terminated  or revoked sooner.       Influenza A by PCR NEGATIVE NEGATIVE   Influenza B by PCR NEGATIVE NEGATIVE    Comment: (NOTE) The Xpert Xpress SARS-CoV-2/FLU/RSV plus assay is intended as an aid in the diagnosis of influenza from Nasopharyngeal swab specimens and should not be used as a sole basis for treatment. Nasal washings and aspirates are unacceptable for Xpert Xpress SARS-CoV-2/FLU/RSV testing.  Fact Sheet for Patients: EntrepreneurPulse.com.au  Fact Sheet for Healthcare Providers: IncredibleEmployment.be  This test is not yet approved or cleared by the [place] FDA and has been authorized for detection and/or diagnosis of SARS-CoV-2 by FDA under an Emergency Use Authorization (EUA). This EUA will remain in effect (meaning this test can be used) for the duration of the COVID-19 declaration under Section 564(b)(1) of the Act, 21 U.S.C. section  360bbb-3(b)(1), unless the authorization is terminated or revoked.  Performed at [facility], [place] [address] [place], [place] [address]    CT ABDOMEN PELVIS W CONTRAST  Result Date: 03/11/2021 CLINICAL DATA:  Abdominal pain, acute, nonlocalized EXAM: CT ABDOMEN AND PELVIS WITH CONTRAST TECHNIQUE: Multidetector CT imaging of the abdomen and pelvis was performed using the standard protocol following bolus administration of intravenous contrast. CONTRAST:  67m OMNIPAQUE IOHEXOL 350 MG/ML SOLN COMPARISON:  Ultrasound 03/11/2021, limited abdominal CT 02/29/2020 FINDINGS: Lower chest: Extensive ground-glass opacity with patchy consolidation within the bilateral lung bases, right slightly worse than left. Cardiomegaly with small pericardial effusion. Hepatobiliary: Heterogeneous appearance of the liver. No focal liver lesion is identified. Unremarkable appearance of the gallbladder. No hyperdense gallstone. No intrahepatic biliary dilatation. Pancreas: Unremarkable. No pancreatic ductal dilatation or surrounding inflammatory changes. Spleen: Normal in size without focal abnormality.  Adrenals/Urinary Tract: Unremarkable adrenal glands. Horseshoe kidney. No focal renal lesion, stone, or hydronephrosis. Urinary bladder is within normal limits. Stomach/Bowel: Stomach is within normal limits. Appendix appears normal (series 3, image 63). No evidence of bowel wall thickening, distention, or inflammatory changes. Vascular/Lymphatic: Normal caliber aorta. Small vessel calcifications are seen within the pelvis. Lymphadenopathy. Reproductive: Prostate is unremarkable. Other: Small to moderate volume ascites throughout the abdomen and pelvis. No organized or rim enhancing fluid collections. No pneumoperitoneum. No abdominal wall hernia. Musculoskeletal: Diffuse anasarca.  No acute osseous abnormality. IMPRESSION: 1. Extensive ground-glass opacity with patchy consolidation within the bilateral lung bases, right  slightly worse than left. Findings may reflect pulmonary edema, multifocal pneumonia, versus ARDS. 2. Cardiomegaly with small pericardial effusion. 3. Small to moderate volume ascites throughout the abdomen and pelvis. 4. Diffuse anasarca. 5. Heterogeneous appearance of the liver may reflect hepatic venous congestion, acute hepatitis, or possibly fatty infiltration. 6. Horseshoe kidney. 7. Age advanced atherosclerosis. Electronically Signed   By: Davina Poke D.O.   On: 03/11/2021 13:05   DG Abdomen Acute W/Chest  Result Date: 03/11/2021 CLINICAL DATA:  Abdominal pain.  Vomiting blood EXAM: DG ABDOMEN ACUTE WITH 1 VIEW CHEST COMPARISON:  02/29/2020 FINDINGS: Formed stool is seen in the colon. No concerning mass effect or calcification. No gas dilated bowel. Cardiomegaly and vascular pedicle widening with diffuse hazy pulmonary opacity asymmetric to the right lung. No effusion or pneumothorax. IMPRESSION: 1. Negative abdominal radiograph. 2. Airspace disease asymmetric to the right lung, history suggesting edema or aspiration. Electronically Signed   By: Monte Fantasia M.D.   On: 03/11/2021 04:12   US Abdomen Limited RUQ (LIVER/GB)  Result Date: 03/11/2021 CLINICAL DATA:  Right upper quadrant pain EXAM: ULTRASOUND ABDOMEN LIMITED RIGHT UPPER QUADRANT COMPARISON:  CT abdomen/pelvis 02/29/2020 FINDINGS: Gallbladder: No gallstones or wall thickening visualized. No sonographic Murphy sign noted by sonographer. Common bile duct: Diameter: 8 mm. There is no intrahepatic biliary ductal dilatation. No obstructing lesion or stone is seen. Liver: No focal lesion identified. Within normal limits in parenchymal echogenicity. Portal vein is patent on color Doppler imaging with normal direction of blood flow towards the liver. Other: There is mild ascites. IMPRESSION: 1. Mild biliary ductal dilatation measuring up to 8 mm. No stone or other obstructing lesion is seen. Correlate with LFTs, and MRCP may be considered if  indicated. 2. Mild ascites. Electronically Signed   By: Valetta Mole M.D.   On: 03/11/2021 10:15    PMH:   Past Medical History:  Diagnosis Date   Diabetes mellitus    ESRD on hemodialysis ([facility])    High cholesterol    Hypertension    Hypertrophic cardiomyopathy ([facility]) 02/26/2021    PSH:   Past Surgical History:  Procedure Laterality Date   AV FISTULA PLACEMENT Left 08/11/2020   Procedure: LEFT UPPER EXTREMITY ARTERIOVENOUS (AV) FISTULA CREATION;  Surgeon: Cherre Robins, MD;  Location: [facility];  Service: Vascular;  Laterality: Left;   FRACTURE SURGERY     I & D EXTREMITY Left 07/16/2014   Procedure: IRRIGATION AND DEBRIDEMENT EXTREMITY/LEFT INDEX FINGER;  Surgeon: Leanora Cover, MD;  Location: [facility];  Service: Orthopedics;  Laterality: Left;   IR PERC TUN PERIT CATH WO PORT S&I /IMAG  08/07/2020   IR US GUIDE VASC ACCESS RIGHT  08/07/2020    Allergies:  Allergies  Allergen Reactions   Amlodipine Nausea And Vomiting and Other (See Comments)    Patient was taking Amlodipine and Hydralazine at the same time, so  the reactions came from one of the 2: Lethargy and an all-over feeling of NOT feeling well (also)   Hydralazine Nausea And Vomiting and Other (See Comments)    Patient was taking Hydralazine AND Amlodipine at the same time, so the reactions came from one of the 2: Lethargy and an all-over feeling of NOT feeling well (also)    Medications:   Prior to Admission medications   Medication Sig Start Date End Date Taking? Authorizing Provider  acetaminophen (TYLENOL) 325 MG tablet Take 2 tablets (650 mg total) by mouth every 4 (four) hours as needed for headache or mild pain. 03/05/21   Samella Parr, NP  atorvastatin (LIPITOR) 40 MG tablet TAKE 1 TABLET (40 MG TOTAL) BY MOUTH DAILY. Patient taking differently: Take 40 mg by mouth daily. 09/06/20 09/06/21  Gildardo Pounds, NP  carvedilol (COREG) 25 MG tablet Take 1 tablet (25 mg total) by mouth 2 (two) times daily with a meal. 03/05/21    Samella Parr, NP  Darbepoetin Alfa (ARANESP) 60 MCG/0.3ML SOSY injection Inject 0.3 mLs (60 mcg total) into the vein every Monday with hemodialysis. 03/09/21   Samella Parr, NP  doxercalciferol ([name]) 4 MCG/2ML injection Inject 2.5 mLs (5 mcg total) into the vein every Monday, Wednesday, and Friday with hemodialysis. 03/06/21   Samella Parr, NP  hydrOXYzine (ATARAX/VISTARIL) 25 MG tablet Take 1 tablet (25 mg total) by mouth 3 (three) times daily as needed for itching. 11/27/20   Lavina Hamman, MD  imipramine (TOFRANIL) 25 MG tablet Take 1 tablet (25 mg total) by mouth at bedtime. 03/05/21   Samella Parr, NP  insulin aspart (NOVOLOG) 100 UNIT/ML injection Inject 0-6 Units into the skin 3 (three) times daily with meals. Do NOT hold if patient is NPO. 03/05/21   Samella Parr, NP  insulin aspart (NOVOLOG) 100 UNIT/ML injection Inject 0-5 Units into the skin at bedtime. Do NOT hold insulin if patient is NPO. 03/05/21   Samella Parr, NP  insulin aspart (NOVOLOG) 100 UNIT/ML injection Inject 3 Units into the skin 3 (three) times daily with meals. 03/05/21   Samella Parr, NP  insulin glargine-yfgn (SEMGLEE) 100 UNIT/ML injection Inject 0.16 mLs (16 Units total) into the skin at bedtime. 03/05/21   Samella Parr, NP  losartan (COZAAR) 50 MG tablet Take 1 tablet (50 mg total) by mouth at bedtime. 03/05/21   Samella Parr, NP  multivitamin (RENA-VIT) TABS tablet Take 1 tablet by mouth at bedtime. 11/27/20   Lavina Hamman, MD  sevelamer carbonate (RENVELA) 800 MG tablet Take 2 tablets (1,600 mg total) by mouth 3 (three) times daily with meals. 03/05/21   Samella Parr, NP    Discontinued Meds:   Medications Discontinued During This Encounter  Medication Reason   ondansetron (ZOFRAN-ODT) disintegrating tablet 4 mg     Social History:  reports that he has never smoked. He has never used smokeless tobacco. He reports that he does not currently use alcohol. He reports that he  does not use drugs.  Family History:   Family History  Problem Relation Age of Onset   Cancer Mother    Stroke Father    Diabetes Father    Hypertension Father     Blood pressure (!) 190/112, pulse 77, temperature 98.8 F (37.1 C), temperature source Oral, resp. rate (!) 24, SpO2 95 %. General: Adult male NAD, interactive but angry Heart: RRR, no MRG Lungs: rales at the bases but  nonlabored breathing on 3L [place] Abdomen: NABS, soft NTND, some abdominal wall edema Extremities: Tr pedal edema Dialysis Access: Positive bruit left Cimino  fistula      Neilani Duffee, Hunt Oris, MD 03/12/2021, 4:08 AM

## 2021-03-13 ENCOUNTER — Other Ambulatory Visit: Payer: Self-pay

## 2021-03-13 DIAGNOSIS — J9601 Acute respiratory failure with hypoxia: Secondary | ICD-10-CM | POA: Diagnosis not present

## 2021-03-13 LAB — CBC
HCT: 27.9 % — ABNORMAL LOW (ref 39.0–52.0)
Hemoglobin: 8.7 g/dL — ABNORMAL LOW (ref 13.0–17.0)
MCH: 26 pg (ref 26.0–34.0)
MCHC: 31.2 g/dL (ref 30.0–36.0)
MCV: 83.5 fL (ref 80.0–100.0)
Platelets: 227 10*3/uL (ref 150–400)
RBC: 3.34 MIL/uL — ABNORMAL LOW (ref 4.22–5.81)
RDW: 19 % — ABNORMAL HIGH (ref 11.5–15.5)
WBC: 7.1 10*3/uL (ref 4.0–10.5)
nRBC: 0 % (ref 0.0–0.2)

## 2021-03-13 LAB — BASIC METABOLIC PANEL
Anion gap: 13 (ref 5–15)
BUN: 57 mg/dL — ABNORMAL HIGH (ref 6–20)
CO2: 27 mmol/L (ref 22–32)
Calcium: 8.4 mg/dL — ABNORMAL LOW (ref 8.9–10.3)
Chloride: 94 mmol/L — ABNORMAL LOW (ref 98–111)
Creatinine, Ser: 10.65 mg/dL — ABNORMAL HIGH (ref 0.61–1.24)
GFR, Estimated: 6 mL/min — ABNORMAL LOW (ref >60)
Glucose, Bld: 112 mg/dL — ABNORMAL HIGH (ref 70–99)
Potassium: 4.5 mmol/L (ref 3.5–5.1)
Sodium: 134 mmol/L — ABNORMAL LOW (ref 135–145)

## 2021-03-13 LAB — GLUCOSE, CAPILLARY
Glucose-Capillary: 97 mg/dL (ref 70–99)
Glucose-Capillary: 187 mg/dL — ABNORMAL HIGH (ref 70–99)
Glucose-Capillary: 204 mg/dL — ABNORMAL HIGH (ref 70–99)

## 2021-03-13 MED ORDER — CHLORHEXIDINE GLUCONATE CLOTH 2 % EX PADS
6.0000 | MEDICATED_PAD | Freq: Every day | CUTANEOUS | Status: DC
  Administered 2021-03-13: 6 via TOPICAL

## 2021-03-13 MED ORDER — PROSOURCE PLUS PO LIQD
30.0000 mL | Freq: Three times a day (TID) | ORAL | Status: DC
  Administered 2021-03-14 – 2021-03-17 (×3): 30 mL via ORAL
  Filled 2021-03-13 (×6): qty 30

## 2021-03-13 MED ORDER — NEPRO/CARBSTEADY PO LIQD: 237.0000 mL | Freq: Two times a day (BID) | ORAL | Status: DC

## 2021-03-13 NOTE — Progress Notes (Signed)
Covid vaccine was not administered. Per pharmacy vaccine should not be administered since pt tested positive on 02/08/21. Pharmacist stated per guidelines pt should not be vaccinated till 90 days after having COVID. MD Dahal notified also.

## 2021-03-13 NOTE — Plan of Care (Deleted)
  Problem: Education: Goal: Knowledge of disease and its progression will improve 03/13/2021 0359 by Jacqualine Code, RN Outcome: Progressing 03/13/2021 0202 by Jacqualine Code, RN Outcome: Progressing Goal: Individualized Educational Video(s) 03/13/2021 0359 by Jacqualine Code, RN Outcome: Progressing 03/13/2021 0202 by Jacqualine Code, RN Outcome: Progressing   Problem: Fluid Volume: Goal: Compliance with measures to maintain balanced fluid volume will improve 03/13/2021 0359 by Jacqualine Code, RN Outcome: Progressing 03/13/2021 0202 by Jacqualine Code, RN Outcome: Progressing   Problem: Health Behavior/Discharge Planning: Goal: Ability to manage health-related needs will improve Outcome: Progressing   Problem: Nutritional: Goal: Ability to make healthy dietary choices will improve 03/13/2021 0359 by Jacqualine Code, RN Outcome: Progressing 03/13/2021 0202 by Jacqualine Code, RN Outcome: Progressing   Problem: Clinical Measurements: Goal: Complications related to the disease process, condition or treatment will be avoided or minimized Outcome: Progressing

## 2021-03-13 NOTE — Plan of Care (Signed)
  Problem: Nutritional: Goal: Ability to make healthy dietary choices will improve Outcome: Progressing   

## 2021-03-13 NOTE — TOC Initial Note (Signed)
Transition of Care [facility]) - Initial/Assessment Note    Patient Details  Name: [name] MRN: [id number] Date of Birth: October 02, 1985  Transition of Care [facility]) CM/SW Contact:    James Feil, LCSW Phone Number: 03/13/2021, 11:32 AM  Clinical Narrative: Talked with patient briefly at bedside to determine if his plan is to return to [name]. [name] was sleeping, but awakened briefly (kept his eyes closed) and responded yes to questions regarding coming from [place] and returning there at discharge.       Contact made with [name], admissions director at [name] regarding patient being ready for discharge today. CSW informed that they would have to get insurance authorization before patient can return and FL-2 needed. FL-2 completed and sent via [place] with initial referral information.       Expected Discharge Plan: [name]) Barriers to Discharge: No Barriers Identified   Patient Goals and CMS Choice Patient states their goals for this hospitalization and ongoing recovery are:: Patient agreeable to returning to [place] at discharge CMS Medicare.gov Compare Post Acute Care list provided to:: Other (Comment Required) (Not needed as patient returning to [facility]) Choice offered to / list presented to : NA  Expected Discharge Plan and Services Expected Discharge Plan: [name]) In-house Referral: Clinical Social Work   Post Acute Care Choice: [name] Living arrangements for the past 2 months: [place]                                      Prior Living Arrangements/Services Living arrangements for the past 2 months: [place] Lives with:: Facility Resident [name]) Patient language and need for interpreter reviewed:: Yes Do you feel safe going back to the place where you live?: Yes      Need for Family  Participation in Patient Care: Yes (Comment) Care giver support system in place?: No (comment)   Criminal Activity/Legal Involvement Pertinent to Current Situation/Hospitalization: No - Comment as needed  Activities of Daily Living Home Assistive Devices/Equipment: None ADL Screening (condition at time of admission) Patient's cognitive ability adequate to safely complete daily activities?: Yes Is the patient deaf or have difficulty hearing?: No Does the patient have difficulty seeing, even when wearing glasses/contacts?: Yes Does the patient have difficulty concentrating, remembering, or making decisions?: No Patient able to express need for assistance with ADLs?: Yes Does the patient have difficulty dressing or bathing?: Yes Independently performs ADLs?: Yes (appropriate for developmental age) Does the patient have difficulty walking or climbing stairs?: Yes Weakness of Legs: Both Weakness of Arms/Hands: None  Permission Sought/Granted Permission sought to share information with : Family Supports ([name] [id number] [phone])    Share Information with NAME: [name]  Permission granted to share info w AGENCY: [name] granted to share info w Relationship: [facility]  Permission granted to share info w Contact Information: [phone]  Emotional Assessment Appearance:: Appears stated age Attitude/Demeanor/Rapport: Other (comment) (Sleepy) Affect (typically observed): Quiet (Patient was awakened) Orientation: : Oriented to Self, Oriented to Place, Oriented to  Time, Oriented to Situation Alcohol / Substance Use: Tobacco Use, Alcohol Use, Illicit Drugs (Per H&P, patient has never smoked and does not drink or use illicit drugs) Psych Involvement: No (comment)  Admission diagnosis:  Right upper quadrant pain [R10.11] Hypoxia [R09.02] Acute respiratory failure with hypoxia (  [place]) [J96.01] Hematemesis without nausea [K92.0] Patient Active Problem List    Diagnosis Date Noted   Acute respiratory failure with hypoxia ([place]) 03/12/2021   ESRD (end stage renal disease) on dialysis ([place]) 03/12/2021   Hypertrophic cardiomyopathy ([place]) 02/26/2021   Syncope 02/08/2021   DKA (diabetic ketoacidosis) ([place]) 02/08/2021   Seizure-like activity ([place])    Hyperglycemia    Acute hypoxemic respiratory failure ([place]) 11/25/2020   Diabetic foot ulcer ([place]) 11/24/2020   Hypervolemia associated with renal insufficiency 08/07/2020   Uremia 123XX123   Metabolic acidosis 123XX123   Hyperkalemia 08/07/2020   Nausea and vomiting 05/07/2020   Anemia of chronic kidney failure 05/07/2020   Dehydration    Hypertensive urgency 02/25/2020   Acute renal failure superimposed on stage 4 chronic kidney disease ([place]) 12/02/2019   Anemia 12/02/2019   Essential hypertension 12/02/2019   SOB (shortness of breath) 12/02/2019   DM2 (diabetes mellitus, type 2) ([place]) 12/02/2019   Diarrhea 03/23/2017   Early satiety 03/23/2017   Generalized abdominal pain 03/23/2017   PCP:  James Pounds, NP Pharmacy:   [facility] [place] [address] [place] [address] Phone: [phone] Fax: [phone]     Social Determinants of Health (SDOH) Interventions  No SDOH interventions requested or needed at this time.  Readmission Risk Interventions Readmission Risk Prevention Plan 03/05/2021 11/27/2020 02/29/2020  Transportation Screening Complete Complete Complete  Home Care Screening - - Complete  Medication Review ([place]) Complete Complete -  PCP or Specialist appointment within 3-5 days of discharge Complete Complete -  [place] or Home Care Consult Complete Complete -  SW Recovery Care/Counseling Consult Complete Complete -  Palliative Care Screening Not Applicable Not Applicable -  [place] Complete Not Applicable -  Some recent data might be hidden

## 2021-03-13 NOTE — Progress Notes (Signed)
[facility] Progress Note   Subjective:  Seen in room. Dyspnea improved with HD yesterday. BP remains a little high. No CP.  Objective Vitals:   03/12/21 1821 03/12/21 2041 03/13/21 0427 03/13/21 0956  BP:  (!) 161/99 (!) 178/109 (!) 191/119  Pulse:  80 77   Resp:  16 16   Temp:  98.9 F (37.2 C) 98.7 F (37.1 C)   TempSrc:  Oral Oral   SpO2:  90% 97% 96%  Weight: 95.3 kg  96.3 kg    Physical Exam General: Chronically ill appearing man, NAD. [place] Heart: RRR; no murmur Lungs: CTAB Abdomen: soft Extremities: No LE edema Dialysis Access: L forearm AVF + thrill  Additional Objective Labs: Basic Metabolic Panel: Recent Labs  Lab 03/12/21 0905 03/12/21 1223 03/12/21 1409 03/13/21 0450  NA 135 136 136 134*  K 5.7* 4.1 4.1 4.5  CL 95* 95*  --  94*  CO2 24 28  --  27  GLUCOSE 99 141*  --  112*  BUN 82* 44*  --  57*  CREATININE 13.59* 8.70*  --  10.65*  CALCIUM 8.5* 8.7*  --  8.4*  PHOS 6.0*  --   --   --    Liver Function Tests: Recent Labs  Lab 03/11/21 0332 03/12/21 0905  AST 28  --   ALT 61*  --   ALKPHOS 202*  --   BILITOT 1.2  --   PROT 7.5  --   ALBUMIN 3.6 3.4*   Recent Labs  Lab 03/11/21 0332  LIPASE 26   CBC: Recent Labs  Lab 03/11/21 0332 03/12/21 0905 03/12/21 1409 03/13/21 0450  WBC 6.4 6.4  --  7.1  NEUTROABS 5.1  --   --   --   HGB 9.2* 8.9* 10.9* 8.7*  HCT 29.4* 28.6* 32.0* 27.9*  MCV 83.8 84.4  --  83.5  PLT 207 208  --  227   Blood Culture    Component Value Date/Time   SDES BLOOD BLOOD RIGHT FOREARM 11/24/2020 1955   SPECREQUEST  11/24/2020 1955    BOTTLES DRAWN AEROBIC AND ANAEROBIC Blood Culture adequate volume   CULT  11/24/2020 1955    NO GROWTH 6 DAYS Performed at [facility], [place] [address], [place], [place] [address]    REPTSTATUS 11/30/2020 FINAL 11/24/2020 1955   CBG: Recent Labs  Lab 03/12/21 0646 03/12/21 1322 03/12/21 1818 03/12/21 2041 03/13/21 0620  GLUCAP 88 143* 204* 263*  97   Studies/Results: CT ABDOMEN PELVIS W CONTRAST  Result Date: 03/11/2021 CLINICAL DATA:  Abdominal pain, acute, nonlocalized EXAM: CT ABDOMEN AND PELVIS WITH CONTRAST TECHNIQUE: Multidetector CT imaging of the abdomen and pelvis was performed using the standard protocol following bolus administration of intravenous contrast. CONTRAST:  78m OMNIPAQUE IOHEXOL 350 MG/ML SOLN COMPARISON:  Ultrasound 03/11/2021, limited abdominal CT 02/29/2020 FINDINGS: Lower chest: Extensive ground-glass opacity with patchy consolidation within the bilateral lung bases, right slightly worse than left. Cardiomegaly with small pericardial effusion. Hepatobiliary: Heterogeneous appearance of the liver. No focal liver lesion is identified. Unremarkable appearance of the gallbladder. No hyperdense gallstone. No intrahepatic biliary dilatation. Pancreas: Unremarkable. No pancreatic ductal dilatation or surrounding inflammatory changes. Spleen: Normal in size without focal abnormality. Adrenals/Urinary Tract: Unremarkable adrenal glands. Horseshoe kidney. No focal renal lesion, stone, or hydronephrosis. Urinary bladder is within normal limits. Stomach/Bowel: Stomach is within normal limits. Appendix appears normal (series 3, image 63). No evidence of bowel wall thickening, distention, or inflammatory changes. Vascular/Lymphatic:  Normal caliber aorta. Small vessel calcifications are seen within the pelvis. Lymphadenopathy. Reproductive: Prostate is unremarkable. Other: Small to moderate volume ascites throughout the abdomen and pelvis. No organized or rim enhancing fluid collections. No pneumoperitoneum. No abdominal wall hernia. Musculoskeletal: Diffuse anasarca.  No acute osseous abnormality. IMPRESSION: 1. Extensive ground-glass opacity with patchy consolidation within the bilateral lung bases, right slightly worse than left. Findings may reflect pulmonary edema, multifocal pneumonia, versus ARDS. 2. Cardiomegaly with small  pericardial effusion. 3. Small to moderate volume ascites throughout the abdomen and pelvis. 4. Diffuse anasarca. 5. Heterogeneous appearance of the liver may reflect hepatic venous congestion, acute hepatitis, or possibly fatty infiltration. 6. Horseshoe kidney. 7. Age advanced atherosclerosis. Electronically Signed   By: Davina Poke D.O.   On: 03/11/2021 13:05   Medications:   atorvastatin  40 mg Oral QHS   carvedilol  25 mg Oral BID WC   Chlorhexidine Gluconate Cloth  6 each Topical Q0600   Chlorhexidine Gluconate Cloth  6 each Topical Q0600   COVID-19 mRNA Vac-TriS (Pfizer)  0.3 mL Intramuscular Once   doxercalciferol  5 mcg Intravenous Q M,W,F-HD   imipramine  25 mg Oral QHS   insulin aspart  0-6 Units Subcutaneous TID WC   insulin glargine-yfgn  16 Units Subcutaneous QHS   losartan  50 mg Oral QHS   multivitamin  1 tablet Oral QHS   sevelamer carbonate  1,600 mg Oral TID WC   sodium chloride flush  3 mL Intravenous Q12H    Dialysis Orders: SW MWF  Dialysis Orders: [facility] MWF 4 hrs 180NRe 450/800 86.5 kg 2.0 K/2.0 Ca L AVF -Heparin-none -[name] 5 mcg IV TIW -Venofer 100 mcg IV X 10 doses 3/7 doses given last dose 02/04/21 -Mircera 225 mcg IV q 2 weeks (last dose 02/02/2021)  Assessment/Plan: Volume overload: S/p HD overnight, got down to ~93kg - still quite a bit above his EDW. For HD again today for volume and to get back on usual schedule. Hypertrophic CM - new Dx recently. Acute systolic HF-EF  123XX123  ESRD - on HD MWF - HD today. HHS/DMT2: sp IV insulin on SQ insulin now AMS: resolved, thought to be related to elevated glucose +/- seizure. CT of head without acute changes/MRI neg as well. Neuro involved no anti sz meds req COVID 19- Pt is off COVID isolation and COVID neg in the ED.  HTN:  Cont home meds. On Coreg 25 bid, Cozaar 50. UF with HD and continue to lower volume as tolerated.  Follow-up BP trend with UF on dialysis before raising BP meds Anemia: Hgb 8.7 -   s/p Aranesp 121mg on 8/29, not due yet.  Metabolic bone disease -  Ca ok.  Phosphorus 6.0 recently increased Renvela to 2 tabs PO TID AC. Continue VDRA.  R toe ulcer  -Completed doxycycline  Nutrition -Renal Carb mod diet, on protein supplements, renal vits. Disposition -   Prev living with aunt but unable to care for him and was d/c to SNF.  KVeneta Penton PA-C 03/13/2021, 10:51 AM  [employer]

## 2021-03-13 NOTE — Progress Notes (Signed)
PROGRESS NOTE  [name]  DOB: [birth year]  PCP: Gildardo Pounds, NP [id number]  DOA: 03/11/2021  LOS: 1 day  Hospital Day: 3   Chief Complaint  Patient presents with   Emesis    Brief narrative: [name] is a 35 y.o. male with PMH significant for ESRD-HD-MWF, HTN, HLD, DM2 with diabetic foot ulcer, history of pseudoseizures, legally blind, who is resident of [facility].   On 8/31, patient was brought to the ED for 2 episodes of hematemesis, abdominal pain. His hemoglobin at that time was found to be stable.  He was planned for discharge to resume routine dialysis that day on 8/31.  However he had to wait several hours in the ED for transport.  In the meantime, patient became hypoxic. He was placed on 3 L oxygen by nasal cannula. Nephrology consultation was obtained and he was placed on in-hospital dialysis. Admitted to hospitalist service on 9/1. See below for details Disposition: Stable to return back to [place] but apparently they need insurance preauthorization.    Subjective: Patient was seen and examined this morning.  Young African-American male.  Lying on bed.  Not in distress.  No new symptoms.. Per RN, he was agitated and loud earlier.  Calm at the time of my evaluation but would not engage much in conversation. Chart reviewed Afebrile, heart rate in 80s, blood pressure persistently elevated over 160s, breathing on room air.  Assessment/Plan: Acute hypoxic respiratory failure Volume overload status ESRD-HD-MWF -Initially presented for hematemesis, remained in the ED waiting for transfer for several hours and missed his dialysis and became short of breath.  Initially required 3 L oxygen management. -Nephrology consult was obtained.  Underwent dialysis with resolution of symptoms.  Currently on room air  Hematemesis -Reported 2-3 episodes of hematemesis at the facility.   -Hemoglobin 8.7 today, baseline between 9-10 -Currently not bleeding.   Continue to monitor. Recent Labs    05/07/20 2244 05/08/20 0639 08/08/20 0534 08/08/20 2150 02/09/21 0256 02/10/21 0357 02/11/21 0210 02/12/21 0509 02/13/21 0332 02/16/21 0654 03/04/21 1418 03/11/21 0332 03/12/21 0905 03/12/21 1409 03/13/21 0450  HGB  --    < > 6.8*   < > 9.7* 9.1* 10.1* 9.9* 10.0*   < > 9.1* 9.2* 8.9* 10.9* 8.7*  MCV  --    < > 85.5   < > 79.7* 81.1 83.5 83.0 82.5   < > 82.5 83.8 84.4  --  83.5  VITAMINB12 564  --   --   --   --   --   --   --   --   --   --   --   --   --   --   FOLATE 8.9  --   --   --   --   --   --   --   --   --   --   --   --   --   --   FERRITIN 358*  --  328   < > 732* 554* 555* 486* 448*  --   --   --   --   --   --   TIBC 192*  --  231*  --   --   --   --   --   --   --   --   --   --   --   --   IRON 12*  --  42*  --   --   --   --   --   --   --   --   --   --   --   --  RETICCTPCT 1.0  --   --   --   --   --   --   --   --   --   --   --   --   --   --    < > = values in this interval not displayed.   Type 2 diabetes mellitus -A1c 12.1 on 7/31 -Home meds include Lantus 16 units daily, Premeal sliding scale insulin -Currently continued on on Lantus 60 units and sliding scale insulin -Blood sugar level was elevated over 200 last night.  Less than 100 this morning. Recent Labs  Lab 03/12/21 0646 03/12/21 1322 03/12/21 1818 03/12/21 2041 03/13/21 0620  GLUCAP 88 143* 204* 263* 97   Chronic systolic CHF Essential hypertension -02/09/21 echo with EF 45-50% -Home meds include Coreg 25 mg twice daily and losartan 50 mg daily. -Continue same. Continue to monitor blood pressure    HLD -Continue Lipitor   Diabetic foot/toe ulceration -Wound care consult appreciated.  Daily wound care with soap and water.  Xeroform with dry gauze.   Legally blind -Supportive care  Mobility: Encourage ambulation Code Status:   Code Status: Full Code  Nutritional status: Body mass index is 28.79 kg/m.     Diet:  Diet Order              Diet renal/carb modified with fluid restriction Diet-HS Snack? Nothing; Fluid restriction: 1200 mL Fluid; Room service appropriate? Yes; Fluid consistency: Thin  Diet effective now                  DVT prophylaxis:  SCDs Start: 03/12/21 1734   Antimicrobials: None Fluid: None Consultants: Nephrology Family Communication: None at bedside  Status is: Inpatient  Remains inpatient appropriate because: Pending reauthorization for discharge back to facility  Dispo: The patient is from: [place]              Anticipated d/c is to: Back to the facility              Patient currently is medically stable to d/c.   Difficult to place patient No     Infusions:    Scheduled Meds:  atorvastatin  40 mg Oral QHS   carvedilol  25 mg Oral BID WC   Chlorhexidine Gluconate Cloth  6 each Topical Q0600   Chlorhexidine Gluconate Cloth  6 each Topical Q0600   COVID-19 mRNA Vac-TriS (Pfizer)  0.3 mL Intramuscular Once   doxercalciferol  5 mcg Intravenous Q M,W,F-HD   imipramine  25 mg Oral QHS   insulin aspart  0-6 Units Subcutaneous TID WC   insulin glargine-yfgn  16 Units Subcutaneous QHS   losartan  50 mg Oral QHS   multivitamin  1 tablet Oral QHS   sevelamer carbonate  1,600 mg Oral TID WC   sodium chloride flush  3 mL Intravenous Q12H    Antimicrobials: Anti-infectives (From admission, onward)    None       PRN meds: acetaminophen **OR** acetaminophen, calcium carbonate (dosed in mg elemental calcium), camphor-menthol **AND** hydrOXYzine, docusate sodium, feeding supplement ([name] CARB STEADY), ondansetron **OR** ondansetron (ZOFRAN) IV, sorbitol, zolpidem   Objective: Vitals:   03/13/21 0427 03/13/21 0956  BP: (!) 178/109 (!) 191/119  Pulse: 77   Resp: 16   Temp: 98.7 F (37.1 C)   SpO2: 97% 96%    Intake/Output Summary (Last 24 hours) at 03/13/2021 1055 Last data filed at 03/13/2021 0700 Gross per 24 hour  Intake 240  ml  Output 3501 ml  Net -3261 ml    Filed Weights   03/12/21 1821 03/13/21 0427  Weight: 95.3 kg 96.3 kg   Weight change:  Body mass index is 28.79 kg/m.   Physical Exam: General exam: Pleasant, young African-American male.  Not in distress Skin: No rashes, lesions or ulcers. HEENT: Atraumatic, normocephalic, no obvious bleeding Lungs: Clear to auscultation bilaterally CVS: Regular rate and rhythm, no murmur GI/Abd soft, nontender, nondistended, bowels are present CNS: Alert, awake, legally blind. Psychiatry: Depressed look Extremities: No pedal edema, no calf tenderness.  Superficial to ulcerations.  Data Review: I have personally reviewed the laboratory data and studies available.  Recent Labs  Lab 03/11/21 0332 03/12/21 0905 03/12/21 1409 03/13/21 0450  WBC 6.4 6.4  --  7.1  NEUTROABS 5.1  --   --   --   HGB 9.2* 8.9* 10.9* 8.7*  HCT 29.4* 28.6* 32.0* 27.9*  MCV 83.8 84.4  --  83.5  PLT 207 208  --  227   Recent Labs  Lab 03/11/21 0332 03/12/21 0905 03/12/21 1223 03/12/21 1409 03/13/21 0450  NA 138 135 136 136 134*  K 4.8 5.7* 4.1 4.1 4.5  CL 97* 95* 95*  --  94*  CO2 '27 24 28  '$ --  27  GLUCOSE 118* 99 141*  --  112*  BUN 67* 82* 44*  --  57*  CREATININE 12.17* 13.59* 8.70*  --  10.65*  CALCIUM 8.8* 8.5* 8.7*  --  8.4*  PHOS  --  6.0*  --   --   --     F/u labs ordered Unresulted Labs (From admission, onward)     Start     Ordered   03/14/21 0500  CBC with Differential/Platelet  Daily,   R      03/13/21 1054   03/14/21 [id number]  Basic metabolic panel  Daily,   R      03/13/21 1054            Signed, Terrilee Croak, MD [facility] 03/13/2021

## 2021-03-13 NOTE — Progress Notes (Signed)
Initial Nutrition Assessment  DOCUMENTATION CODES:  Not applicable  INTERVENTION:  Add [name] Shake po BID, each supplement provides 425 kcal and 19 grams protein.  Add 30 ml ProSource Plus po TID, each supplement provides 100 kcal and 15 grams of protein.   Continue Rena-Vite daily.  NUTRITION DIAGNOSIS:  Increased nutrient needs related to chronic illness (ESRD on HD) as evidenced by estimated needs.  GOAL:  Patient will meet greater than or equal to 90% of their needs  MONITOR:  PO intake, Supplement acceptance, Labs, Weight trends, Skin, I & O's  REASON FOR ASSESSMENT:  Consult Assessment of nutrition requirement/status  ASSESSMENT:  35 yo male with PMH significant for ESRD on HD, IDDM off insulin, HTN, HLD, and legally blind presented with AMS 2/2 HHS/DKA with coma vs syncope vs seizure. Incidental finding of COVID+ last admission. Admitted acute respiratory failure with hypoxia.  Pt unavailable at time of RD visit.  Pt ate 100% of breakfast this morning, per Epic.  Pt's weight up since previous admission.  EDW: 86.5 kg  Recommend starting [name] shakes BID and ProSource Plus TID.  Medications: reviewed; doxercalciferol MWF, SSI, Semglee, Rena-Vit, Renvela TID  Labs: reviewed; Na 134 (L), CBG 97-263 HbA1c: 12.9% (02/08/2021)  NUTRITION - FOCUSED PHYSICAL EXAM: Unable to perform  Diet Order:   Diet Order             Diet renal/carb modified with fluid restriction Diet-HS Snack? Nothing; Fluid restriction: 1200 mL Fluid; Room service appropriate? Yes; Fluid consistency: Thin  Diet effective now                  EDUCATION NEEDS:  No education needs have been identified at this time  Skin:  Skin Assessment: Skin Integrity Issues: Skin Integrity Issues:: Other (Comment) Other: Non-pressure wound on toes  Last BM:  unknown  Height:  Ht Readings from Last 1 Encounters:  02/08/21 6' (1.829 m)   Weight:  Wt Readings from Last 1 Encounters:  03/13/21  96.3 kg   BMI:  Body mass index is 28.79 kg/m.  Estimated Nutritional Needs:  Kcal:  2150-2350 Protein:  105-120 grams Fluid:  1L + UOP  Derrel Nip, RD, LDN (she/her/hers) Registered Dietitian I After-Hours/Weekend Pager # in [place]

## 2021-03-13 NOTE — Consult Note (Signed)
[facility] Nurse Consult Note: Reason for Consult:Right foot, 3rd and 4th digits with partial thickness skin loss Wound type:Neuropathic vs trauma vs infection Pressure Injury POA: NA Measurement:See also photos provided by provider in the EHR under media tab, partial thickness, appropriately 1.5cm x 1cm x 0.1cm on each digit. Wound bed:red, moist Drainage (amount, consistency, odor) small serous Periwound: intact, moist Dressing procedure/placement/frequency: I will provide daily wound care orders for nursing using a soap and water cleanse, thorough drying-particularly between the digits and topping of the lesions with an antimicrobial nonadherent (xeroform) covered with dry gauze. This is to be secured with a few turns of conform bandaging/paper tape.  Additionally and if not contraindicated, a few doses of an oral antifungal (e.g. Diflucan) could be considered.  [facility] nursing team will not follow, but will remain available to this patient, the nursing and medical teams.  Please re-consult if needed. Thanks, Maudie Flakes, MSN, RN, [place], [facility]  Pager# [phone]

## 2021-03-13 NOTE — NC FL2 (Signed)
[place] LEVEL OF CARE SCREENING TOOL     IDENTIFICATION  Patient Name: [name] Birthdate: 1986/04/01 Sex: male Admission Date (Current Location): 03/11/2021  [place] and [place] Number:  Kathleen Argue [id number] Facility and Address:  [facility], [place] [address], [place], [place] [address]      Provider Number: [id number]  Attending Physician Name and Address:  Terrilee Croak, MD  Relative Name and Phone Number:  Daune Perch [phone]    Current Level of Care: Hospital Recommended Level of Care: [place] Prior Approval Number:    Date Approved/Denied:   PASRR Number: [id number]  Discharge Plan: SNF    Current Diagnoses: Patient Active Problem List   Diagnosis Date Noted   Acute respiratory failure with hypoxia ([place]) 03/12/2021   ESRD (end stage renal disease) on dialysis ([place]) 03/12/2021   Hypertrophic cardiomyopathy ([place]) 02/26/2021   Syncope 02/08/2021   DKA (diabetic ketoacidosis) ([place]) 02/08/2021   Seizure-like activity ([place])    Hyperglycemia    Acute hypoxemic respiratory failure ([place]) 11/25/2020   Diabetic foot ulcer ([place]) 11/24/2020   Hypervolemia associated with renal insufficiency 08/07/2020   Uremia 123XX123   Metabolic acidosis 123XX123   Hyperkalemia 08/07/2020   Nausea and vomiting 05/07/2020   Anemia of chronic kidney failure 05/07/2020   Dehydration    Hypertensive urgency 02/25/2020   Acute renal failure superimposed on stage 4 chronic kidney disease ([place]) 12/02/2019   Anemia 12/02/2019   Essential hypertension 12/02/2019   SOB (shortness of breath) 12/02/2019   DM2 (diabetes mellitus, type 2) ([place]) 12/02/2019   Diarrhea 03/23/2017   Early satiety 03/23/2017   Generalized abdominal pain 03/23/2017    Orientation RESPIRATION BLADDER Height & Weight     Self, Time, Situation, Place    Continent Weight: 212 lb 4.9 oz (96.3 kg) Height:     BEHAVIORAL SYMPTOMS/MOOD  NEUROLOGICAL BOWEL NUTRITION STATUS      Continent    AMBULATORY STATUS COMMUNICATION OF NEEDS Skin   Independent Verbally Other (Comment) (Partial thickness & skin loss to right foot, 3-4th digits)                       Personal Care Assistance Level of Assistance  Bathing, Feeding, Dressing Bathing Assistance: Independent Feeding assistance: Independent Dressing Assistance: Independent     Functional Limitations Info  Sight, Hearing, Speech Sight Info: Impaired Hearing Info: Adequate Speech Info: Adequate    SPECIAL CARE FACTORS FREQUENCY                       Contractures Contractures Info: Not present    Additional Factors Info  Allergies, Code Status, Insulin Sliding Scale Code Status Info: Full Allergies Info: Amolodipine, Hydralazine   Insulin Sliding Scale Info: 0-6 Units 3 times daily with meals       Current Medications (03/13/2021):  This is the current hospital active medication list Current Facility-Administered Medications  Medication Dose Route Frequency Provider Last Rate Last Admin   acetaminophen (TYLENOL) tablet 650 mg  650 mg Oral Q6H PRN Karmen Bongo, MD       Or   acetaminophen (TYLENOL) suppository 650 mg  650 mg Rectal Q6H PRN Karmen Bongo, MD       atorvastatin (LIPITOR) tablet 40 mg  40 mg Oral Ivery Quale, MD   40 mg at 03/12/21 2253   calcium carbonate (dosed in mg elemental calcium) suspension 500 mg  of elemental calcium  500 mg of elemental calcium Oral Q6H PRN Karmen Bongo, MD       camphor-menthol [facility]) lotion 1 application  1 application Topical [id number] PRN Karmen Bongo, MD       And   hydrOXYzine (ATARAX/VISTARIL) tablet 25 mg  25 mg Oral Q8H PRN Karmen Bongo, MD       carvedilol (COREG) tablet 25 mg  25 mg Oral BID WC Karmen Bongo, MD       Chlorhexidine Gluconate Cloth 2 % PADS 6 each  6 each Topical Q0600 Karmen Bongo, MD   6 each at 03/12/21 1206   Chlorhexidine Gluconate Cloth 2 % PADS 6 each  6  each Topical Q0600 Loren Racer, PA-C   6 each at 03/13/21 [id number]   COVID-19 mRNA Vac-TriS (Pfizer) injection 0.3 mL  0.3 mL Intramuscular Once Karmen Bongo, MD       docusate sodium (ENEMEEZ) enema 283 mg  1 enema Rectal PRN Karmen Bongo, MD       doxercalciferol ([name]) injection 5 mcg  5 mcg Intravenous Q M,W,F-HD Karmen Bongo, MD       feeding supplement ([name] CARB STEADY) liquid 237 mL  237 mL Oral TID PRN Karmen Bongo, MD       imipramine (TOFRANIL) tablet 25 mg  25 mg Oral Ivery Quale, MD   25 mg at 03/12/21 2253   insulin aspart (novoLOG) injection 0-6 Units  0-6 Units Subcutaneous TID WC Karmen Bongo, MD       insulin glargine-yfgn [facility]) injection 16 Units  16 Units Subcutaneous QHS Karmen Bongo, MD   16 Units at 03/12/21 2254   losartan (COZAAR) tablet 50 mg  50 mg Oral Ivery Quale, MD   50 mg at 03/12/21 2253   multivitamin (RENA-VIT) tablet 1 tablet  1 tablet Oral Ivery Quale, MD   1 tablet at 03/12/21 2253   ondansetron (ZOFRAN) tablet 4 mg  4 mg Oral Q6H PRN Karmen Bongo, MD       Or   ondansetron [facility]) injection 4 mg  4 mg Intravenous Q6H PRN Karmen Bongo, MD       sevelamer carbonate (RENVELA) tablet 1,600 mg  1,600 mg Oral TID WC Karmen Bongo, MD   1,600 mg at 03/13/21 0956   sodium chloride flush (NS) 0.9 % injection 3 mL  3 mL Intravenous Lillia Mountain, MD   3 mL at 03/12/21 2254   sorbitol 70 % solution 30 mL  30 mL Oral PRN Karmen Bongo, MD       zolpidem Lorrin Mais) tablet 5 mg  5 mg Oral QHS PRN Karmen Bongo, MD         Discharge Medications: Please see discharge summary for a list of discharge medications.  Relevant Imaging Results:  Relevant Lab Results:   Additional Information ss# [id number]. Dialysis patient - MWF - [place]  Darrel Gloss [place], [place]

## 2021-03-13 NOTE — Plan of Care (Signed)

## 2021-03-14 DIAGNOSIS — J9601 Acute respiratory failure with hypoxia: Secondary | ICD-10-CM | POA: Diagnosis not present

## 2021-03-14 LAB — GLUCOSE, CAPILLARY
Glucose-Capillary: 145 mg/dL — ABNORMAL HIGH (ref 70–99)
Glucose-Capillary: 133 mg/dL — ABNORMAL HIGH (ref 70–99)
Glucose-Capillary: 227 mg/dL — ABNORMAL HIGH (ref 70–99)
Glucose-Capillary: 210 mg/dL — ABNORMAL HIGH (ref 70–99)

## 2021-03-14 LAB — BASIC METABOLIC PANEL
Anion gap: 10 (ref 5–15)
BUN: 40 mg/dL — ABNORMAL HIGH (ref 6–20)
CO2: 29 mmol/L (ref 22–32)
Calcium: 8.3 mg/dL — ABNORMAL LOW (ref 8.9–10.3)
Chloride: 96 mmol/L — ABNORMAL LOW (ref 98–111)
Creatinine, Ser: 8.24 mg/dL — ABNORMAL HIGH (ref 0.61–1.24)
GFR, Estimated: 8 mL/min — ABNORMAL LOW (ref >60)
Glucose, Bld: 130 mg/dL — ABNORMAL HIGH (ref 70–99)
Potassium: 4.3 mmol/L (ref 3.5–5.1)
Sodium: 135 mmol/L (ref 135–145)

## 2021-03-14 LAB — CBC WITH DIFFERENTIAL/PLATELET
Abs Immature Granulocytes: 0.02 10*3/uL (ref 0.00–0.07)
Basophils Absolute: 0 10*3/uL (ref 0.0–0.1)
Basophils Relative: 1 %
Eosinophils Absolute: 0.1 10*3/uL (ref 0.0–0.5)
Eosinophils Relative: 2 %
HCT: 27.1 % — ABNORMAL LOW (ref 39.0–52.0)
Hemoglobin: 8.5 g/dL — ABNORMAL LOW (ref 13.0–17.0)
Immature Granulocytes: 0 %
Lymphocytes Relative: 14 %
Lymphs Abs: 0.8 10*3/uL (ref 0.7–4.0)
MCH: 26.1 pg (ref 26.0–34.0)
MCHC: 31.4 g/dL (ref 30.0–36.0)
MCV: 83.1 fL (ref 80.0–100.0)
Monocytes Absolute: 0.7 10*3/uL (ref 0.1–1.0)
Monocytes Relative: 12 %
Neutro Abs: 4.3 10*3/uL (ref 1.7–7.7)
Neutrophils Relative %: 71 %
Platelets: 218 10*3/uL (ref 150–400)
RBC: 3.26 MIL/uL — ABNORMAL LOW (ref 4.22–5.81)
RDW: 18.8 % — ABNORMAL HIGH (ref 11.5–15.5)
WBC: 6 10*3/uL (ref 4.0–10.5)
nRBC: 0 % (ref 0.0–0.2)

## 2021-03-14 MED ORDER — SODIUM CHLORIDE 0.9 % IV SOLN: 100.0000 mL | INTRAVENOUS | Status: DC | PRN

## 2021-03-14 MED ORDER — LIDOCAINE HCL (PF) 1 % IJ SOLN: 5.0000 mL | INTRAMUSCULAR | Status: DC | PRN

## 2021-03-14 MED ORDER — HEPARIN SODIUM (PORCINE) 1000 UNIT/ML DIALYSIS: 1000.0000 [IU] | INTRAMUSCULAR | Status: DC | PRN

## 2021-03-14 MED ORDER — PENTAFLUOROPROP-TETRAFLUOROETH EX AERO: 1.0000 "application " | INHALATION_SPRAY | CUTANEOUS | Status: DC | PRN

## 2021-03-14 MED ORDER — LIDOCAINE-PRILOCAINE 2.5-2.5 % EX CREA: 1.0000 "application " | TOPICAL_CREAM | CUTANEOUS | Status: DC | PRN

## 2021-03-14 MED ORDER — CLONIDINE HCL 0.1 MG PO TABS
0.1000 mg | ORAL_TABLET | Freq: Two times a day (BID) | ORAL | Status: AC
  Administered 2021-03-14 (×2): 0.1 mg via ORAL
  Filled 2021-03-14 (×2): qty 1

## 2021-03-14 NOTE — Progress Notes (Signed)
[facility] Progress Note   Subjective:  Seen in room - did fine with HD yesterday, 5L UFG. Still above EDW, but says dyspnea has resolved.   Objective Vitals:   03/13/21 1500 03/13/21 1535 03/13/21 2051 03/14/21 0618  BP: (!) 169/89 (!) 184/99 131/82 (!) 170/107  Pulse: 85 86 79 79  Resp:  '16 18 18  '$ Temp:  97.9 F (36.6 C) 99.1 F (37.3 C) 98.9 F (37.2 C)  TempSrc:  Oral Oral Oral  SpO2:  97% 93% 90%  Weight:  92.2 kg     Physical Exam General: Chronically ill appearing man, NAD. Blind Heart: RRR; no murmur Lungs: CTAB Abdomen: soft Extremities: No LE edema Dialysis Access: L forearm AVF + thrill  Additional Objective Labs: Basic Metabolic Panel: Recent Labs  Lab 03/12/21 0905 03/12/21 1223 03/12/21 1409 03/13/21 0450 03/14/21 0237  NA 135 136 136 134* 135  K 5.7* 4.1 4.1 4.5 4.3  CL 95* 95*  --  94* 96*  CO2 24 28  --  27 29  GLUCOSE 99 141*  --  112* 130*  BUN 82* 44*  --  57* 40*  CREATININE 13.59* 8.70*  --  10.65* 8.24*  CALCIUM 8.5* 8.7*  --  8.4* 8.3*  PHOS 6.0*  --   --   --   --    Liver Function Tests: Recent Labs  Lab 03/11/21 0332 03/12/21 0905  AST 28  --   ALT 61*  --   ALKPHOS 202*  --   BILITOT 1.2  --   PROT 7.5  --   ALBUMIN 3.6 3.4*   Recent Labs  Lab 03/11/21 0332  LIPASE 26   CBC: Recent Labs  Lab 03/11/21 0332 03/12/21 0905 03/12/21 1409 03/13/21 0450 03/14/21 0237  WBC 6.4 6.4  --  7.1 6.0  NEUTROABS 5.1  --   --   --  4.3  HGB 9.2* 8.9* 10.9* 8.7* 8.5*  HCT 29.4* 28.6* 32.0* 27.9* 27.1*  MCV 83.8 84.4  --  83.5 83.1  PLT 207 208  --  227 218   Medications:   (feeding supplement) PROSource Plus  30 mL Oral TID BM   atorvastatin  40 mg Oral QHS   carvedilol  25 mg Oral BID WC   Chlorhexidine Gluconate Cloth  6 each Topical Q0600   Chlorhexidine Gluconate Cloth  6 each Topical Q0600   doxercalciferol  5 mcg Intravenous Q M,W,F-HD   feeding supplement ([name] CARB STEADY)  237 mL Oral BID BM    imipramine  25 mg Oral QHS   insulin aspart  0-6 Units Subcutaneous TID WC   insulin glargine-yfgn  16 Units Subcutaneous QHS   losartan  50 mg Oral QHS   multivitamin  1 tablet Oral QHS   sevelamer carbonate  1,600 mg Oral TID WC   sodium chloride flush  3 mL Intravenous Q12H    Dialysis Orders: [facility] MWF  Dialysis Orders: [facility] MWF 4 hrs 180NRe 450/800 86.5 kg 2.0 K/2.0 Ca L AVF -Heparin-none -[name] 5 mcg IV TIW -Venofer 100 mcg IV X 10 doses 3/7 doses given last dose 02/04/21 -Mircera 225 mcg IV q 2 weeks (last dose 02/02/2021)   Assessment/Plan: Volume overload: S/p HD 9/1 and 9/2, still ~5kg over his dry weight but dyspnea improved. Hypertrophic CM - new Dx recently. Acute systolic HF-EF  [id number]  ESRD: Usual MWF schedule -> next HD 9/5. HHS/DMT2: sp IV insulin on SQ insulin now AMS: resolved, thought  to be related to elevated glucose +/- seizure. CT of head without acute changes/MRI neg as well. Neuro involved no anti sz meds req COVID 19- Pt is off COVID isolation and COVID neg in the ED.  HTN:  Variable BP. Continue home meds: Coreg 25 bid, Losartan 50. Continue to work to get volume down with next few HD treatments. Anemia: Hgb 8.5 -  s/p Aranesp 113mg on 8/29, not due yet.  Metabolic bone disease: Ca ok, Phos high and Renvela was increased to 2/meals. Continue VDRA.  R toe ulcer: S/p course of PO doxycycline  Nutrition -Renal Carb mod diet, on protein supplements, renal vits. Disposition -   Prev living with aunt but unable to care for him and was d/c to SNF.  KVeneta Penton PA-C 03/14/2021, 10:15 AM  [employer]

## 2021-03-14 NOTE — Plan of Care (Signed)
  Problem: Education: Goal: Knowledge of disease and its progression will improve Outcome: Completed/Met

## 2021-03-14 NOTE — Progress Notes (Signed)
PROGRESS NOTE  [name]  DOB: [birth year]  PCP: Gildardo Pounds, NP [id number]  DOA: 03/11/2021  LOS: 1 day  Hospital Day: 4   Chief Complaint  Patient presents with   Emesis    Brief narrative: [name] is a 35 y.o. male with PMH significant for ESRD-HD-MWF, HTN, HLD, DM2 with diabetic foot ulcer, history of pseudoseizures, legally blind, who is resident of [facility].   On 8/31, patient was brought to the ED for 2 episodes of hematemesis, abdominal pain. His hemoglobin at that time was found to be stable.  He was planned for discharge to resume routine dialysis that day on 8/31.  However he had to wait several hours in the ED for transport.  In the meantime, patient became hypoxic. He was placed on 3 L oxygen by nasal cannula. Nephrology consultation was obtained and he was placed on in-hospital dialysis. Admitted to hospitalist service on 9/1. See below for details Disposition: Stable to return back to [place] but apparently they need insurance preauthorization.    03/14/2021: Patient seen.  Patient blood pressure has been significantly elevated.  We will start patient on clonidine 0.1 Mg p.o. twice daily x2 doses.  We will continue to monitor blood pressure closely.  Patient is awaiting disposition (insurance authorization).  Subjective: -No fever or chills. -No abdominal pain. -Blood sugar is significantly well controlled. -Last hemodialysis was yesterday. -No shortness of breath. -No chest pain.   Assessment/Plan: Acute hypoxic respiratory failure Volume overload status ESRD-HD-MWF -Initially presented for hematemesis, remained in the ED waiting for transfer for several hours and missed his dialysis and became short of breath.  Initially required 3 L oxygen management. -Nephrology consult was obtained.  Underwent dialysis with resolution of symptoms.  Currently on room air 03/14/2021: Nephrology team is managing.  Hematemesis -Reported 2-3  episodes of hematemesis at the facility.   -Hemoglobin 8.7 today, baseline between 9-10 -Currently not bleeding.  Continue to monitor. Recent Labs    05/07/20 2244 05/08/20 0639 08/08/20 0534 08/08/20 2150 02/09/21 0256 02/10/21 0357 02/11/21 0210 02/12/21 0509 02/13/21 0332 02/16/21 0654 03/11/21 0332 03/12/21 0905 03/12/21 1409 03/13/21 0450 03/14/21 0237  HGB  --    < > 6.8*   < > 9.7* 9.1* 10.1* 9.9* 10.0*   < > 9.2* 8.9* 10.9* 8.7* 8.5*  MCV  --    < > 85.5   < > 79.7* 81.1 83.5 83.0 82.5   < > 83.8 84.4  --  83.5 83.1  VITAMINB12 564  --   --   --   --   --   --   --   --   --   --   --   --   --   --   FOLATE 8.9  --   --   --   --   --   --   --   --   --   --   --   --   --   --   FERRITIN 358*  --  328   < > 732* 554* 555* 486* 448*  --   --   --   --   --   --   TIBC 192*  --  231*  --   --   --   --   --   --   --   --   --   --   --   --   IRON  12*  --  42*  --   --   --   --   --   --   --   --   --   --   --   --   RETICCTPCT 1.0  --   --   --   --   --   --   --   --   --   --   --   --   --   --    < > = values in this interval not displayed.    Type 2 diabetes mellitus -A1c 12.1 on 7/31 -Home meds include Lantus 16 units daily, Premeal sliding scale insulin -Currently continued on on Lantus 60 units and sliding scale insulin -Blood sugar level was elevated over 200 last night.  Less than 100 this morning. 03/14/2021: Continue to monitor closely.  Currently, blood sugar is significantly well controlled. Recent Labs  Lab 03/13/21 0620 03/13/21 1603 03/13/21 2049 03/14/21 0620 03/14/21 1147  GLUCAP 97 187* 204* 145* 133*    Chronic systolic CHF Essential hypertension -02/09/21 echo with EF 45-50% -Home meds include Coreg 25 mg twice daily and losartan 50 mg daily. -Continue same. Continue to monitor blood pressure    HLD -Continue Lipitor   Diabetic foot/toe ulceration -Wound care consult appreciated.  Daily wound care with soap and water.  Xeroform  with dry gauze.   Legally blind -Supportive care  Mobility: Encourage ambulation Code Status:   Code Status: Full Code  Nutritional status: Body mass index is 27.57 kg/m. Nutrition Problem: Increased nutrient needs Etiology: chronic illness (ESRD on HD) Signs/Symptoms: estimated needs Diet:  Diet Order             Diet renal/carb modified with fluid restriction Diet-HS Snack? Nothing; Fluid restriction: 1200 mL Fluid; Room service appropriate? Yes; Fluid consistency: Thin  Diet effective now                  DVT prophylaxis:  SCDs Start: 03/12/21 1734   Antimicrobials: None Fluid: None Consultants: Nephrology Family Communication: None at bedside  Status is: Inpatient  Remains inpatient appropriate because: Pending reauthorization for discharge back to facility  Dispo: The patient is from: [place]              Anticipated d/c is to: Back to the facility              Patient currently is medically stable to d/c.   Difficult to place patient No     Infusions:    Scheduled Meds:  (feeding supplement) PROSource Plus  30 mL Oral TID BM   atorvastatin  40 mg Oral QHS   carvedilol  25 mg Oral BID WC   Chlorhexidine Gluconate Cloth  6 each Topical Q0600   Chlorhexidine Gluconate Cloth  6 each Topical Q0600   cloNIDine  0.1 mg Oral BID   doxercalciferol  5 mcg Intravenous Q M,W,F-HD   feeding supplement ([name] CARB STEADY)  237 mL Oral BID BM   imipramine  25 mg Oral QHS   insulin aspart  0-6 Units Subcutaneous TID WC   insulin glargine-yfgn  16 Units Subcutaneous QHS   losartan  50 mg Oral QHS   multivitamin  1 tablet Oral QHS   sevelamer carbonate  1,600 mg Oral TID WC   sodium chloride flush  3 mL Intravenous Q12H    Antimicrobials: Anti-infectives (From admission, onward)    None  PRN meds: acetaminophen **OR** acetaminophen, calcium carbonate (dosed in mg elemental calcium), camphor-menthol **AND** hydrOXYzine, docusate sodium,  ondansetron **OR** ondansetron (ZOFRAN) IV, sorbitol, zolpidem   Objective: Vitals:   03/14/21 0618 03/14/21 1109  BP: (!) 170/107 (!) 169/108  Pulse: 79 76  Resp: 18 18  Temp: 98.9 F (37.2 C) 98.9 F (37.2 C)  SpO2: 90% 95%    Intake/Output Summary (Last 24 hours) at 03/14/2021 1505 Last data filed at 03/13/2021 1800 Gross per 24 hour  Intake 120 ml  Output 5000 ml  Net -4880 ml    Filed Weights   03/13/21 0427 03/13/21 1049 03/13/21 1535  Weight: 96.3 kg 97.2 kg 92.2 kg   Weight change: 1.9 kg Body mass index is 27.57 kg/m.   Physical Exam: General exam: Not in any distress.  Patient is awake and alert.   HEENT: Patient is pale.  No jaundice.   Lungs: Clear to auscultation bilaterally CVS: S1-S2. GI/Abd soft, nontender, nondistended, bowels are present CNS: Alert, awake, legally blind.  Patient moves all extremities. Extremities: Fullness of the ankle.  Data Review: I have personally reviewed the laboratory data and studies available.  Recent Labs  Lab 03/11/21 0332 03/12/21 0905 03/12/21 1409 03/13/21 0450 03/14/21 0237  WBC 6.4 6.4  --  7.1 6.0  NEUTROABS 5.1  --   --   --  4.3  HGB 9.2* 8.9* 10.9* 8.7* 8.5*  HCT 29.4* 28.6* 32.0* 27.9* 27.1*  MCV 83.8 84.4  --  83.5 83.1  PLT 207 208  --  227 218    Recent Labs  Lab 03/11/21 0332 03/12/21 0905 03/12/21 1223 03/12/21 1409 03/13/21 0450 03/14/21 0237  NA 138 135 136 136 134* 135  K 4.8 5.7* 4.1 4.1 4.5 4.3  CL 97* 95* 95*  --  94* 96*  CO2 '27 24 28  '$ --  27 29  GLUCOSE 118* 99 141*  --  112* 130*  BUN 67* 82* 44*  --  57* 40*  CREATININE 12.17* 13.59* 8.70*  --  10.65* 8.24*  CALCIUM 8.8* 8.5* 8.7*  --  8.4* 8.3*  PHOS  --  6.0*  --   --   --   --      F/u labs ordered Unresulted Labs (From admission, onward)     Start     Ordered   03/14/21 0500  CBC with Differential/Platelet  Daily,   R      03/13/21 1054   03/14/21 [id number]  Basic metabolic panel  Daily,   R      03/13/21 1054            Time spent: 25 minutes.  Signed, Bonnell Public, MD [facility] 03/14/2021

## 2021-03-14 NOTE — Plan of Care (Signed)
  Problem: Nutrition: Goal: Adequate nutrition will be maintained Outcome: Progressing   Problem: Coping: Goal: Level of anxiety will decrease Outcome: Progressing   

## 2021-03-14 NOTE — Progress Notes (Signed)
Pt refused to have dressing on foot changed stating it was just done earlier. Will continue to monitor.

## 2021-03-15 DIAGNOSIS — J9601 Acute respiratory failure with hypoxia: Secondary | ICD-10-CM | POA: Diagnosis not present

## 2021-03-15 LAB — CBC WITH DIFFERENTIAL/PLATELET
Abs Immature Granulocytes: 0.01 10*3/uL (ref 0.00–0.07)
Basophils Absolute: 0 10*3/uL (ref 0.0–0.1)
Basophils Relative: 0 %
Eosinophils Absolute: 0.2 10*3/uL (ref 0.0–0.5)
Eosinophils Relative: 3 %
HCT: 26.5 % — ABNORMAL LOW (ref 39.0–52.0)
Hemoglobin: 8.5 g/dL — ABNORMAL LOW (ref 13.0–17.0)
Immature Granulocytes: 0 %
Lymphocytes Relative: 14 %
Lymphs Abs: 1 10*3/uL (ref 0.7–4.0)
MCH: 26.6 pg (ref 26.0–34.0)
MCHC: 32.1 g/dL (ref 30.0–36.0)
MCV: 82.8 fL (ref 80.0–100.0)
Monocytes Absolute: 0.8 10*3/uL (ref 0.1–1.0)
Monocytes Relative: 12 %
Neutro Abs: 4.9 10*3/uL (ref 1.7–7.7)
Neutrophils Relative %: 71 %
Platelets: 233 10*3/uL (ref 150–400)
RBC: 3.2 MIL/uL — ABNORMAL LOW (ref 4.22–5.81)
RDW: 18.4 % — ABNORMAL HIGH (ref 11.5–15.5)
WBC: 7 10*3/uL (ref 4.0–10.5)
nRBC: 0 % (ref 0.0–0.2)

## 2021-03-15 LAB — BASIC METABOLIC PANEL
Anion gap: 11 (ref 5–15)
BUN: 53 mg/dL — ABNORMAL HIGH (ref 6–20)
CO2: 27 mmol/L (ref 22–32)
Calcium: 8.5 mg/dL — ABNORMAL LOW (ref 8.9–10.3)
Chloride: 91 mmol/L — ABNORMAL LOW (ref 98–111)
Creatinine, Ser: 9.92 mg/dL — ABNORMAL HIGH (ref 0.61–1.24)
GFR, Estimated: 6 mL/min — ABNORMAL LOW (ref >60)
Glucose, Bld: 194 mg/dL — ABNORMAL HIGH (ref 70–99)
Potassium: 4.8 mmol/L (ref 3.5–5.1)
Sodium: 129 mmol/L — ABNORMAL LOW (ref 135–145)

## 2021-03-15 LAB — GLUCOSE, CAPILLARY
Glucose-Capillary: 176 mg/dL — ABNORMAL HIGH (ref 70–99)
Glucose-Capillary: 160 mg/dL — ABNORMAL HIGH (ref 70–99)
Glucose-Capillary: 171 mg/dL — ABNORMAL HIGH (ref 70–99)
Glucose-Capillary: 150 mg/dL — ABNORMAL HIGH (ref 70–99)
Glucose-Capillary: 203 mg/dL — ABNORMAL HIGH (ref 70–99)

## 2021-03-15 NOTE — Progress Notes (Signed)
PROGRESS NOTE  [name]  DOB: 03-Apr-1986  PCP: Gildardo Pounds, NP [id number]  DOA: 03/11/2021  LOS: 1 day  Hospital Day: 5   Chief Complaint  Patient presents with   Emesis    Brief narrative: [name] is a 35 y.o. male with PMH significant for ESRD-HD-MWF, HTN, HLD, DM2 with diabetic foot ulcer, history of pseudoseizures, legally blind, who is resident of [facility].   On 8/31, patient was brought to the ED for 2 episodes of hematemesis, abdominal pain. His hemoglobin at that time was found to be stable.  He was planned for discharge to resume routine dialysis that day on 8/31.  However he had to wait several hours in the ED for transport.  In the meantime, patient became hypoxic. He was placed on 3 L oxygen by nasal cannula. Nephrology consultation was obtained and he was placed on in-hospital dialysis. Admitted to hospitalist service on 9/1. See below for details Disposition: Stable to return back to [place] but apparently they need insurance preauthorization.    03/14/2021: Patient seen.  Patient blood pressure has been significantly elevated.  We will start patient on clonidine 0.1 Mg p.o. twice daily x2 doses.  We will continue to monitor blood pressure closely.  Patient is awaiting disposition (insurance authorization).  03/15/2021: Patient is awaiting disposition.  No new changes.  Subjective: -No fever or chills. -No abdominal pain. -Blood sugar is significantly well controlled. -No shortness of breath. -No chest pain.   Assessment/Plan: Acute hypoxic respiratory failure Volume overload status ESRD-HD-MWF -Initially presented for hematemesis, remained in the ED waiting for transfer for several hours and missed his dialysis and became short of breath.  Initially required 3 L oxygen management. -Nephrology consult was obtained.  Underwent dialysis with resolution of symptoms.  Currently on room air 03/14/2021: Nephrology team is  managing.  Hematemesis -Reported 2-3 episodes of hematemesis at the facility.   -Hemoglobin 8.7 today, baseline between 9-10 -Currently not bleeding.  Continue to monitor. Recent Labs    05/07/20 2244 05/08/20 0639 08/08/20 0534 08/08/20 2150 02/09/21 0256 02/10/21 0357 02/11/21 0210 02/12/21 0509 02/13/21 0332 02/16/21 0654 03/12/21 0905 03/12/21 1409 03/13/21 0450 03/14/21 0237 03/15/21 0020  HGB  --    < > 6.8*   < > 9.7* 9.1* 10.1* 9.9* 10.0*   < > 8.9* 10.9* 8.7* 8.5* 8.5*  MCV  --    < > 85.5   < > 79.7* 81.1 83.5 83.0 82.5   < > 84.4  --  83.5 83.1 82.8  VITAMINB12 564  --   --   --   --   --   --   --   --   --   --   --   --   --   --   FOLATE 8.9  --   --   --   --   --   --   --   --   --   --   --   --   --   --   FERRITIN 358*  --  328   < > 732* 554* 555* 486* 448*  --   --   --   --   --   --   TIBC 192*  --  231*  --   --   --   --   --   --   --   --   --   --   --   --  IRON 12*  --  42*  --   --   --   --   --   --   --   --   --   --   --   --   RETICCTPCT 1.0  --   --   --   --   --   --   --   --   --   --   --   --   --   --    < > = values in this interval not displayed.    Type 2 diabetes mellitus -A1c 12.1 on 7/31 -Home meds include Lantus 16 units daily, Premeal sliding scale insulin -Currently continued on on Lantus 60 units and sliding scale insulin -Blood sugar level was elevated over 200 last night.  Less than 100 this morning. 03/15/2021: Continue to monitor closely.  Currently, blood sugar is significantly well controlled. Recent Labs  Lab 03/14/21 2015 03/15/21 0508 03/15/21 0639 03/15/21 1155 03/15/21 1655  GLUCAP 210* 176* 160* 171* 150*    Chronic systolic CHF Essential hypertension -02/09/21 echo with EF 45-50% -Home meds include Coreg 25 mg twice daily and losartan 50 mg daily. -Continue same. Continue to monitor blood pressure    HLD -Continue Lipitor   Diabetic foot/toe ulceration -Wound care consult appreciated.  Daily  wound care with soap and water.  Xeroform with dry gauze.   Legally blind -Supportive care  Mobility: Encourage ambulation Code Status:   Code Status: Full Code  Nutritional status: Body mass index is 27.57 kg/m. Nutrition Problem: Increased nutrient needs Etiology: chronic illness (ESRD on HD) Signs/Symptoms: estimated needs Diet:  Diet Order             Diet renal/carb modified with fluid restriction Diet-HS Snack? Nothing; Fluid restriction: 1200 mL Fluid; Room service appropriate? Yes; Fluid consistency: Thin  Diet effective now                  DVT prophylaxis:  SCDs Start: 03/12/21 1734   Antimicrobials: None Fluid: None Consultants: Nephrology Family Communication: None at bedside  Status is: Inpatient  Remains inpatient appropriate because: Pending reauthorization for discharge back to facility  Dispo: The patient is from: [place]              Anticipated d/c is to: Back to the facility              Patient currently is medically stable to d/c.   Difficult to place patient No     Infusions:   sodium chloride     sodium chloride      Scheduled Meds:  (feeding supplement) PROSource Plus  30 mL Oral TID BM   atorvastatin  40 mg Oral QHS   carvedilol  25 mg Oral BID WC   doxercalciferol  5 mcg Intravenous Q M,W,F-HD   feeding supplement ([name] CARB STEADY)  237 mL Oral BID BM   imipramine  25 mg Oral QHS   insulin aspart  0-6 Units Subcutaneous TID WC   insulin glargine-yfgn  16 Units Subcutaneous QHS   losartan  50 mg Oral QHS   multivitamin  1 tablet Oral QHS   sevelamer carbonate  1,600 mg Oral TID WC   sodium chloride flush  3 mL Intravenous Q12H    Antimicrobials: Anti-infectives (From admission, onward)    None       PRN meds: sodium chloride, sodium chloride, acetaminophen **OR** acetaminophen, calcium carbonate (dosed in mg  elemental calcium), camphor-menthol **AND** hydrOXYzine, docusate sodium, heparin, lidocaine (PF),  lidocaine-prilocaine, ondansetron **OR** ondansetron (ZOFRAN) IV, pentafluoroprop-tetrafluoroeth, sorbitol, zolpidem   Objective: Vitals:   03/15/21 0842 03/15/21 1812  BP: (!) 158/101 (!) 158/99  Pulse: 72   Resp: 18 18  Temp: 98.3 F (36.8 C) 98.7 F (37.1 C)  SpO2: 100% 100%    Intake/Output Summary (Last 24 hours) at 03/15/2021 1832 Last data filed at 03/15/2021 0842 Gross per 24 hour  Intake 837 ml  Output 0 ml  Net 837 ml    Filed Weights   03/13/21 0427 03/13/21 1049 03/13/21 1535  Weight: 96.3 kg 97.2 kg 92.2 kg   Weight change:  Body mass index is 27.57 kg/m.   Physical Exam: General exam: Not in any distress.  Patient is awake and alert.   HEENT: Patient is pale.  No jaundice.   Lungs: Clear to auscultation bilaterally CVS: S1-S2. GI/Abd soft, nontender, nondistended, bowels are present CNS: Alert, awake, legally blind.  Patient moves all extremities. Extremities: Fullness of the ankle.  Data Review: I have personally reviewed the laboratory data and studies available.  Recent Labs  Lab 03/11/21 0332 03/12/21 0905 03/12/21 1409 03/13/21 0450 03/14/21 0237 03/15/21 0020  WBC 6.4 6.4  --  7.1 6.0 7.0  NEUTROABS 5.1  --   --   --  4.3 4.9  HGB 9.2* 8.9* 10.9* 8.7* 8.5* 8.5*  HCT 29.4* 28.6* 32.0* 27.9* 27.1* 26.5*  MCV 83.8 84.4  --  83.5 83.1 82.8  PLT 207 208  --  227 218 233    Recent Labs  Lab 03/12/21 0905 03/12/21 1223 03/12/21 1409 03/13/21 0450 03/14/21 0237 03/15/21 0020  NA 135 136 136 134* 135 129*  K 5.7* 4.1 4.1 4.5 4.3 4.8  CL 95* 95*  --  94* 96* 91*  CO2 24 28  --  '27 29 27  '$ GLUCOSE 99 141*  --  112* 130* 194*  BUN 82* 44*  --  57* 40* 53*  CREATININE 13.59* 8.70*  --  10.65* 8.24* 9.92*  CALCIUM 8.5* 8.7*  --  8.4* 8.3* 8.5*  PHOS 6.0*  --   --   --   --   --      F/u labs ordered Unresulted Labs (From admission, onward)     Start     Ordered   03/14/21 0500  CBC with Differential/Platelet  Daily,   R      03/13/21  1054   03/14/21 [id number]  Basic metabolic panel  Daily,   R      03/13/21 1054   Signed and Held  Renal function panel  Once,   R       Question:  Specimen collection method  Answer:  Lab=Lab collect   Signed and Held   Signed and Held  CBC  Once,   R       Question:  Specimen collection method  Answer:  Lab=Lab collect   Signed and Held           Time spent: 25 minutes.  Signed, Bonnell Public, MD [facility] 03/15/2021

## 2021-03-15 NOTE — Progress Notes (Signed)
[facility] Progress Note   Subjective:   Seen in room - denies CP or dyspnea. No overnight issues. Awaiting insurance authorization to return to SNF. For HD tomorrow if still here.  Objective Vitals:   03/14/21 1730 03/14/21 2013 03/15/21 0504 03/15/21 0842  BP: (!) 156/94 127/73 (!) 168/106 (!) 158/101  Pulse:  70 73 72  Resp:  '17 18 18  '$ Temp:  98.6 F (37 C) 98.6 F (37 C) 98.3 F (36.8 C)  TempSrc:  Oral  Oral  SpO2:  92% 95% 100%  Weight:       Physical Exam General: Chronically ill appearing man, NAD. Blind Heart: RRR; no murmur Lungs: Occ rhonchi, clear in upper lobes Abdomen: soft Extremities: No LE edema Dialysis Access: L forearm AVF + thrill    Additional Objective Labs: Basic Metabolic Panel: Recent Labs  Lab 03/12/21 0905 03/12/21 1223 03/13/21 0450 03/14/21 0237 03/15/21 0020  NA 135   < > 134* 135 129*  K 5.7*   < > 4.5 4.3 4.8  CL 95*   < > 94* 96* 91*  CO2 24   < > '27 29 27  '$ GLUCOSE 99   < > 112* 130* 194*  BUN 82*   < > 57* 40* 53*  CREATININE 13.59*   < > 10.65* 8.24* 9.92*  CALCIUM 8.5*   < > 8.4* 8.3* 8.5*  PHOS 6.0*  --   --   --   --    < > = values in this interval not displayed.   Liver Function Tests: Recent Labs  Lab 03/11/21 0332 03/12/21 0905  AST 28  --   ALT 61*  --   ALKPHOS 202*  --   BILITOT 1.2  --   PROT 7.5  --   ALBUMIN 3.6 3.4*   Recent Labs  Lab 03/11/21 0332  LIPASE 26   CBC: Recent Labs  Lab 03/11/21 0332 03/12/21 0905 03/12/21 1409 03/13/21 0450 03/14/21 0237 03/15/21 0020  WBC 6.4 6.4  --  7.1 6.0 7.0  NEUTROABS 5.1  --   --   --  4.3 4.9  HGB 9.2* 8.9*   < > 8.7* 8.5* 8.5*  HCT 29.4* 28.6*   < > 27.9* 27.1* 26.5*  MCV 83.8 84.4  --  83.5 83.1 82.8  PLT 207 208  --  227 218 233   < > = values in this interval not displayed.   Medications:  sodium chloride     sodium chloride      (feeding supplement) PROSource Plus  30 mL Oral TID BM   atorvastatin  40 mg Oral QHS    carvedilol  25 mg Oral BID WC   doxercalciferol  5 mcg Intravenous Q M,W,F-HD   feeding supplement ([name] CARB STEADY)  237 mL Oral BID BM   imipramine  25 mg Oral QHS   insulin aspart  0-6 Units Subcutaneous TID WC   insulin glargine-yfgn  16 Units Subcutaneous QHS   losartan  50 mg Oral QHS   multivitamin  1 tablet Oral QHS   sevelamer carbonate  1,600 mg Oral TID WC   sodium chloride flush  3 mL Intravenous Q12H    Dialysis Orders: SW MWF  Dialysis Orders: [facility] MWF 4 hrs 180NRe 450/800 86.5 kg 2.0 K/2.0 Ca L AVF -Heparin-none -[name] 5 mcg IV TIW -Venofer 100 mcg IV X 10 doses 3/7 doses given last dose 02/04/21 -Mircera 225 mcg IV q 2 weeks (last dose  02/02/2021)   Assessment/Plan: Volume overload: S/p HD 9/1 and 9/2, still ~5kg over his dry weight but dyspnea improved. Fluids restrictions discussed. Hypertrophic CM - new Dx recently. Acute systolic HF-EF  [id number]  ESRD: Usual MWF schedule -> next HD 9/5. HHS/DMT2: sp IV insulin on SQ insulin now AMS: resolved, thought to be related to elevated glucose +/- seizure. CT of head without acute changes/MRI neg as well. Neuro involved no anti sz meds req COVID 19- Pt is off COVID isolation and COVID neg in the ED.  HTN:  Variable BP. Continue home meds: Coreg 25 bid, Losartan 50. Continue to work to get volume down with next few HD treatments. Anemia: Hgb 8.5 -  s/p Aranesp 161mg on 8/29, not due yet.  Metabolic bone disease: Ca ok, Phos high and Renvela was increased to 2/meals. Continue VDRA.  R toe ulcer: S/p course of PO doxycycline  Nutrition -Renal Carb mod diet, on protein supplements, renal vits. Disposition -   Prev living with aunt but unable to care for him and was d/c to SNF.    KVeneta Penton PA-C 03/15/2021, 10:14 AM  [employer]

## 2021-03-16 DIAGNOSIS — J9601 Acute respiratory failure with hypoxia: Secondary | ICD-10-CM | POA: Diagnosis not present

## 2021-03-16 LAB — CBC WITH DIFFERENTIAL/PLATELET
Abs Immature Granulocytes: 0.03 10*3/uL (ref 0.00–0.07)
Basophils Absolute: 0 10*3/uL (ref 0.0–0.1)
Basophils Relative: 1 %
Eosinophils Absolute: 0.2 10*3/uL (ref 0.0–0.5)
Eosinophils Relative: 2 %
HCT: 26.6 % — ABNORMAL LOW (ref 39.0–52.0)
Hemoglobin: 8.6 g/dL — ABNORMAL LOW (ref 13.0–17.0)
Immature Granulocytes: 1 %
Lymphocytes Relative: 16 %
Lymphs Abs: 1 10*3/uL (ref 0.7–4.0)
MCH: 26.5 pg (ref 26.0–34.0)
MCHC: 32.3 g/dL (ref 30.0–36.0)
MCV: 82.1 fL (ref 80.0–100.0)
Monocytes Absolute: 0.6 10*3/uL (ref 0.1–1.0)
Monocytes Relative: 10 %
Neutro Abs: 4.6 10*3/uL (ref 1.7–7.7)
Neutrophils Relative %: 70 %
Platelets: 235 10*3/uL (ref 150–400)
RBC: 3.24 MIL/uL — ABNORMAL LOW (ref 4.22–5.81)
RDW: 18.3 % — ABNORMAL HIGH (ref 11.5–15.5)
WBC: 6.4 10*3/uL (ref 4.0–10.5)
nRBC: 0 % (ref 0.0–0.2)

## 2021-03-16 LAB — GLUCOSE, CAPILLARY
Glucose-Capillary: 148 mg/dL — ABNORMAL HIGH (ref 70–99)
Glucose-Capillary: 145 mg/dL — ABNORMAL HIGH (ref 70–99)
Glucose-Capillary: 175 mg/dL — ABNORMAL HIGH (ref 70–99)
Glucose-Capillary: 126 mg/dL — ABNORMAL HIGH (ref 70–99)

## 2021-03-16 LAB — BASIC METABOLIC PANEL
Anion gap: 11 (ref 5–15)
BUN: 69 mg/dL — ABNORMAL HIGH (ref 6–20)
CO2: 25 mmol/L (ref 22–32)
Calcium: 8.8 mg/dL — ABNORMAL LOW (ref 8.9–10.3)
Chloride: 92 mmol/L — ABNORMAL LOW (ref 98–111)
Creatinine, Ser: 11.75 mg/dL — ABNORMAL HIGH (ref 0.61–1.24)
GFR, Estimated: 5 mL/min — ABNORMAL LOW (ref >60)
Glucose, Bld: 171 mg/dL — ABNORMAL HIGH (ref 70–99)
Potassium: 5.2 mmol/L — ABNORMAL HIGH (ref 3.5–5.1)
Sodium: 128 mmol/L — ABNORMAL LOW (ref 135–145)

## 2021-03-16 MED ORDER — CLONIDINE HCL 0.1 MG PO TABS
0.1000 mg | ORAL_TABLET | Freq: Three times a day (TID) | ORAL | Status: DC
  Administered 2021-03-16 – 2021-03-18 (×6): 0.1 mg via ORAL
  Filled 2021-03-16 (×6): qty 1

## 2021-03-16 NOTE — TOC Progression Note (Signed)
Transition of Care [facility]) - Progression Note    Patient Details  Name: [name] MRN: [id number] Date of Birth: [birth year]  Transition of Care [facility]) CM/SW Contact  Sharlet Salina Mila Homer, LCSW Phone Number: 03/16/2021, 1:59 PM  Clinical Narrative:  [facility] admissions director Antoinette (1:55 pm) regarding insurance authorization and it has not been received yet. Per admissions director, she will contact CSW once authorization received.       Expected Discharge Plan: [facility]) Barriers to Discharge: No Barriers Identified  Expected Discharge Plan and Services Expected Discharge Plan: [facility]) In-house Referral: Clinical Social Work   Post Acute Care Choice: [place] Living arrangements for the past 2 months: [place]                                       Social Determinants of Health (SDOH) Interventions  No SDOH interventions requested or needed at this time.   Readmission Risk Interventions Readmission Risk Prevention Plan 03/05/2021 11/27/2020 02/29/2020  Transportation Screening Complete Complete Complete  Home Care Screening - - Complete  Medication Review ([place]) Complete Complete -  PCP or Specialist appointment within 3-5 days of discharge Complete Complete -  [place] or Home Care Consult Complete Complete -  SW Recovery Care/Counseling Consult Complete Complete -  Palliative Care Screening Not Applicable Not Applicable -  [place] Complete Not Applicable -  Some recent data might be hidden

## 2021-03-16 NOTE — Plan of Care (Signed)
  Problem: Clinical Measurements: Goal: Ability to maintain clinical measurements within normal limits will improve Outcome: Progressing   

## 2021-03-16 NOTE — Progress Notes (Signed)
PROGRESS NOTE  [name]  DOB: [birth year]  PCP: Gildardo Pounds, NP [id number]  DOA: 03/11/2021  LOS: 1 day  Hospital Day: 6   Chief Complaint  Patient presents with   Emesis    Brief narrative: [name] is a 35 y.o. male with PMH significant for ESRD-HD-MWF, HTN, HLD, DM2 with diabetic foot ulcer, history of pseudoseizures, legally blind, who is resident of [facility].   On 8/31, patient was brought to the ED for 2 episodes of hematemesis, abdominal pain. His hemoglobin at that time was found to be stable.  He was planned for discharge to resume routine dialysis that day on 8/31.  However he had to wait several hours in the ED for transport.  In the meantime, patient became hypoxic. He was placed on 3 L oxygen by nasal cannula. Nephrology consultation was obtained and he was placed on in-hospital dialysis. Admitted to hospitalist service on 9/1. See below for details Disposition: Stable to return back to [place] but apparently they need insurance preauthorization.    03/16/2021: Patient seen on hemodialysis.  Ultrafiltration today is 4 L.  Blood pressure remains uncontrolled.  We will start patient on clonidine 0.1 Mg p.o. 3 times daily.  Continue to optimize blood pressure.  Goal blood pressure should be less than 130/80 mmHg.  Subjective: -No fever or chills. -No abdominal pain. -Blood sugar is significantly well controlled. -No shortness of breath. -No chest pain.   Assessment/Plan: Acute hypoxic respiratory failure Volume overload status ESRD-HD-MWF -Initially presented for hematemesis, remained in the ED waiting for transfer for several hours and missed his dialysis and became short of breath.  Initially required 3 L oxygen management. -Nephrology consult was obtained.  Underwent dialysis with resolution of symptoms.  Currently on room air 03/16/2021: Nephrology team is directing care.  Seen on hemodialysis today.    Hematemesis -Reported 2-3  episodes of hematemesis at the facility.   -Hemoglobin 8.7 today, baseline between 9-10 -Currently not bleeding.  Continue to monitor. Recent Labs    05/07/20 2244 05/08/20 0639 08/08/20 0534 08/08/20 2150 02/09/21 0256 02/10/21 0357 02/11/21 0210 02/12/21 0509 02/13/21 0332 02/16/21 0654 03/12/21 1409 03/13/21 0450 03/14/21 0237 03/15/21 0020 03/16/21 0232  HGB  --    < > 6.8*   < > 9.7* 9.1* 10.1* 9.9* 10.0*   < > 10.9* 8.7* 8.5* 8.5* 8.6*  MCV  --    < > 85.5   < > 79.7* 81.1 83.5 83.0 82.5   < >  --  83.5 83.1 82.8 82.1  VITAMINB12 564  --   --   --   --   --   --   --   --   --   --   --   --   --   --   FOLATE 8.9  --   --   --   --   --   --   --   --   --   --   --   --   --   --   FERRITIN 358*  --  328   < > 732* 554* 555* 486* 448*  --   --   --   --   --   --   TIBC 192*  --  231*  --   --   --   --   --   --   --   --   --   --   --   --  IRON 12*  --  42*  --   --   --   --   --   --   --   --   --   --   --   --   RETICCTPCT 1.0  --   --   --   --   --   --   --   --   --   --   --   --   --   --    < > = values in this interval not displayed.    Type 2 diabetes mellitus -A1c 12.1 on 7/31 -Home meds include Lantus 16 units daily, Premeal sliding scale insulin -Currently continued on on Lantus 60 units and sliding scale insulin -Blood sugar level was elevated over 200 last night.  Less than 100 this morning. 03/15/2021: Continue to monitor closely.  Currently, blood sugar is significantly well controlled. Recent Labs  Lab 03/15/21 1155 03/15/21 1655 03/15/21 2120 03/16/21 0639 03/16/21 1301  GLUCAP 171* 150* 203* 148* 145*    Chronic systolic CHF Essential hypertension/hypertensive urgency: -02/09/21 echo with EF 45-50% -Home meds include Coreg 25 mg twice daily and losartan 50 mg daily. -Continue same. Continue to monitor blood pressure -Add clonidine 0.1 Mg p.o. 3 times daily.    HLD -Continue Lipitor   Diabetic foot/toe ulceration -Wound care  consult appreciated.  Daily wound care with soap and water.  Xeroform with dry gauze.   Legally blind -Supportive care  Mobility: Encourage ambulation Code Status:   Code Status: Full Code  Nutritional status: Body mass index is 29.69 kg/m. Nutrition Problem: Increased nutrient needs Etiology: chronic illness (ESRD on HD) Signs/Symptoms: estimated needs Diet:  Diet Order             Diet renal/carb modified with fluid restriction Diet-HS Snack? Nothing; Fluid restriction: 1200 mL Fluid; Room service appropriate? Yes; Fluid consistency: Thin  Diet effective now                  DVT prophylaxis:  SCDs Start: 03/12/21 1734   Antimicrobials: None Fluid: None Consultants: Nephrology Family Communication: None at bedside  Status is: Inpatient  Remains inpatient appropriate because: Pending reauthorization for discharge back to facility  Dispo: The patient is from: [place]              Anticipated d/c is to: Back to the facility              Patient currently is medically stable to d/c.   Difficult to place patient No     Infusions:     Scheduled Meds:  (feeding supplement) PROSource Plus  30 mL Oral TID BM   atorvastatin  40 mg Oral QHS   carvedilol  25 mg Oral BID WC   doxercalciferol  5 mcg Intravenous Q M,W,F-HD   feeding supplement ([name] CARB STEADY)  237 mL Oral BID BM   imipramine  25 mg Oral QHS   insulin aspart  0-6 Units Subcutaneous TID WC   insulin glargine-yfgn  16 Units Subcutaneous QHS   losartan  50 mg Oral QHS   multivitamin  1 tablet Oral QHS   sevelamer carbonate  1,600 mg Oral TID WC   sodium chloride flush  3 mL Intravenous Q12H    Antimicrobials: Anti-infectives (From admission, onward)    None       PRN meds: acetaminophen **OR** acetaminophen, calcium carbonate (dosed in mg elemental calcium), camphor-menthol **AND** hydrOXYzine, docusate  sodium, ondansetron **OR** ondansetron (ZOFRAN) IV, sorbitol, zolpidem    Objective: Vitals:   03/16/21 1000 03/16/21 1259  BP: (!) (P) 170/95 (!) 165/99  Pulse: (P) 78 80  Resp: (P) 20 18  Temp:  98.4 F (36.9 C)  SpO2:  95%    Intake/Output Summary (Last 24 hours) at 03/16/2021 1508 Last data filed at 03/16/2021 1330 Gross per 24 hour  Intake 960 ml  Output 0 ml  Net 960 ml    Filed Weights   03/13/21 1049 03/13/21 1535 03/16/21 0801  Weight: 97.2 kg 92.2 kg 99.3 kg   Weight change:  Body mass index is 29.69 kg/m.   Physical Exam: General exam: Not in any distress.  Patient is awake and alert.   HEENT: Patient is pale.  No jaundice.   Lungs: Clear to auscultation bilaterally CVS: S1-S2. GI/Abd soft, nontender, nondistended, bowels are present CNS: Alert, awake, legally blind.  Patient moves all extremities. Extremities: No leg edema.  Data Review: I have personally reviewed the laboratory data and studies available.  Recent Labs  Lab 03/11/21 0332 03/12/21 0905 03/12/21 1409 03/13/21 0450 03/14/21 0237 03/15/21 0020 03/16/21 0232  WBC 6.4 6.4  --  7.1 6.0 7.0 6.4  NEUTROABS 5.1  --   --   --  4.3 4.9 4.6  HGB 9.2* 8.9* 10.9* 8.7* 8.5* 8.5* 8.6*  HCT 29.4* 28.6* 32.0* 27.9* 27.1* 26.5* 26.6*  MCV 83.8 84.4  --  83.5 83.1 82.8 82.1  PLT 207 208  --  227 218 233 235    Recent Labs  Lab 03/12/21 0905 03/12/21 1223 03/12/21 1409 03/13/21 0450 03/14/21 0237 03/15/21 0020 03/16/21 0232  NA 135 136 136 134* 135 129* 128*  K 5.7* 4.1 4.1 4.5 4.3 4.8 5.2*  CL 95* 95*  --  94* 96* 91* 92*  CO2 24 28  --  '27 29 27 25  '$ GLUCOSE 99 141*  --  112* 130* 194* 171*  BUN 82* 44*  --  57* 40* 53* 69*  CREATININE 13.59* 8.70*  --  10.65* 8.24* 9.92* 11.75*  CALCIUM 8.5* 8.7*  --  8.4* 8.3* 8.5* 8.8*  PHOS 6.0*  --   --   --   --   --   --      F/u labs ordered Unresulted Labs (From admission, onward)    None      Time spent: 25 minutes.  Signed, Bonnell Public, MD [facility] 03/16/2021

## 2021-03-16 NOTE — Progress Notes (Signed)
[facility] Progress Note   Subjective:    Seen and examined during HD-tolerating well so far with UFG 4.5L.   Objective Vitals:   03/16/21 0830 03/16/21 0900 03/16/21 0930 03/16/21 1000  BP: (!) (P) 180/97 (!) (P) 159/60 (!) (P) 189/76 (!) (P) 170/95  Pulse: (P) 71 (P) 72 (P) 74 (P) 78  Resp: (P) 16 (P) 16 (P) 20 (P) 20  Temp:      TempSrc:      SpO2:      Weight:       Physical Exam General: Chronically ill-appearing; NAD; Blind Heart: S1 and S2; No murmurs, gallops, or rubs. Lungs: Clear, no wheezing, rales, or rhonchi Abdomen: Soft and non-tender Extremities: No edema BLLE Dialysis Access: L AVF (+) bruit/thrill   Filed Weights   03/13/21 1049 03/13/21 1535 03/16/21 0801  Weight: 97.2 kg 92.2 kg 99.3 kg    Intake/Output Summary (Last 24 hours) at 03/16/2021 1157 Last data filed at 03/16/2021 0700 Gross per 24 hour  Intake 957 ml  Output 0 ml  Net 957 ml    Additional Objective Labs: Basic Metabolic Panel: Recent Labs  Lab 03/12/21 0905 03/12/21 1223 03/14/21 0237 03/15/21 0020 03/16/21 0232  NA 135   < > 135 129* 128*  K 5.7*   < > 4.3 4.8 5.2*  CL 95*   < > 96* 91* 92*  CO2 24   < > '29 27 25  '$ GLUCOSE 99   < > 130* 194* 171*  BUN 82*   < > 40* 53* 69*  CREATININE 13.59*   < > 8.24* 9.92* 11.75*  CALCIUM 8.5*   < > 8.3* 8.5* 8.8*  PHOS 6.0*  --   --   --   --    < > = values in this interval not displayed.   Liver Function Tests: Recent Labs  Lab 03/11/21 0332 03/12/21 0905  AST 28  --   ALT 61*  --   ALKPHOS 202*  --   BILITOT 1.2  --   PROT 7.5  --   ALBUMIN 3.6 3.4*   Recent Labs  Lab 03/11/21 0332  LIPASE 26   CBC: Recent Labs  Lab 03/12/21 0905 03/12/21 1409 03/13/21 0450 03/14/21 0237 03/15/21 0020 03/16/21 0232  WBC 6.4  --  7.1 6.0 7.0 6.4  NEUTROABS  --   --   --  4.3 4.9 4.6  HGB 8.9*   < > 8.7* 8.5* 8.5* 8.6*  HCT 28.6*   < > 27.9* 27.1* 26.5* 26.6*  MCV 84.4  --  83.5 83.1 82.8 82.1  PLT 208  --  227  218 233 235   < > = values in this interval not displayed.   Blood Culture    Component Value Date/Time   SDES BLOOD BLOOD RIGHT FOREARM 11/24/2020 1955   SPECREQUEST  11/24/2020 1955    BOTTLES DRAWN AEROBIC AND ANAEROBIC Blood Culture adequate volume   CULT  11/24/2020 1955    NO GROWTH 6 DAYS Performed at [facility], [place] [address] [place], [place] [address]    REPTSTATUS 11/30/2020 FINAL 11/24/2020 1955    Cardiac Enzymes: No results for input(s): CKTOTAL, CKMB, CKMBINDEX, TROPONINI in the last 168 hours. CBG: Recent Labs  Lab 03/15/21 0639 03/15/21 1155 03/15/21 1655 03/15/21 2120 03/16/21 0639  GLUCAP 160* 171* 150* 203* 148*   Iron Studies: No results for input(s): IRON, TIBC, TRANSFERRIN, FERRITIN in the last 72 hours. Lab Results  Component Value Date   INR 1.1 08/11/2020   INR 1.1 05/09/2020   INR 1.0 02/29/2020   Studies/Results: No results found.  Medications:  sodium chloride     sodium chloride      (feeding supplement) PROSource Plus  30 mL Oral TID BM   atorvastatin  40 mg Oral QHS   carvedilol  25 mg Oral BID WC   doxercalciferol  5 mcg Intravenous Q M,W,F-HD   feeding supplement ([name] CARB STEADY)  237 mL Oral BID BM   imipramine  25 mg Oral QHS   insulin aspart  0-6 Units Subcutaneous TID WC   insulin glargine-yfgn  16 Units Subcutaneous QHS   losartan  50 mg Oral QHS   multivitamin  1 tablet Oral QHS   sevelamer carbonate  1,600 mg Oral TID WC   sodium chloride flush  3 mL Intravenous Q12H    Dialysis Orders:  SW MWF  Dialysis Orders: [facility] MWF 4 hrs 180NRe 450/800 86.5 kg 2.0 K/2.0 Ca L AVF -Heparin-none -[name] 5 mcg IV TIW -Venofer 100 mcg IV X 10 doses 3/7 doses given last dose 02/04/21 -Mircera 225 mcg IV q 2 weeks (last dose 02/02/2021)  Assessment/Plan: Volume overload: S/p HD 9/1 and 9/2, still ~5kg over his dry weight but dyspnea improved. Fluids restrictions discussed. Hypertrophic CM - new Dx  recently. Acute systolic HF-EF  [id number]  ESRD: Usual MWF schedule -> HD today UFG 4.5L. Plan for HD 9/7 if he is till here. HHS/DMT2: sp IV insulin on SQ insulin now AMS: resolved, thought to be related to elevated glucose +/- seizure. CT of head without acute changes/MRI neg as well. Neuro involved no anti sz meds req COVID 19- Pt is off COVID isolation and COVID neg in the ED.  HTN:  Variable BP. Continue home meds: Coreg 25 bid, Losartan 50. Continue to work to get volume down with next few HD treatments. Anemia: Hgb 8.6 -  s/p Aranesp 164mg on 8/29-should receive dose today.  Metabolic bone disease: Ca ok, Phos high and Renvela was increased to 2/meals. Continue VDRA.  R toe ulcer: S/p course of PO doxycycline  Nutrition -Renal Carb mod diet, on protein supplements, renal vits. Disposition -   Prev living with aunt but unable to care for him and was d/c to SNF. Awaiting insurance authorization to return to SNF.  CTobie Poet NP [facility] 03/16/2021,11:57 AM  LOS: 1 day

## 2021-03-17 DIAGNOSIS — J9601 Acute respiratory failure with hypoxia: Secondary | ICD-10-CM | POA: Diagnosis not present

## 2021-03-17 LAB — GLUCOSE, CAPILLARY
Glucose-Capillary: 105 mg/dL — ABNORMAL HIGH (ref 70–99)
Glucose-Capillary: 145 mg/dL — ABNORMAL HIGH (ref 70–99)
Glucose-Capillary: 133 mg/dL — ABNORMAL HIGH (ref 70–99)
Glucose-Capillary: 191 mg/dL — ABNORMAL HIGH (ref 70–99)

## 2021-03-17 MED ORDER — LIDOCAINE-PRILOCAINE 2.5-2.5 % EX CREA
1.0000 | TOPICAL_CREAM | CUTANEOUS | Status: DC | PRN
Start: 2021-03-17 — End: 2021-03-18

## 2021-03-17 MED ORDER — SODIUM CHLORIDE 0.9 % IV SOLN: 100.0000 mL | INTRAVENOUS | Status: DC | PRN

## 2021-03-17 MED ORDER — LIDOCAINE HCL (PF) 1 % IJ SOLN: 5.0000 mL | INTRAMUSCULAR | Status: DC | PRN

## 2021-03-17 MED ORDER — CHLORHEXIDINE GLUCONATE CLOTH 2 % EX PADS: 6.0000 | MEDICATED_PAD | Freq: Every day | CUTANEOUS | Status: DC

## 2021-03-17 MED ORDER — ALTEPLASE 2 MG IJ SOLR: 2.0000 mg | Freq: Once | INTRAMUSCULAR | Status: DC | PRN

## 2021-03-17 MED ORDER — HEPARIN SODIUM (PORCINE) 1000 UNIT/ML DIALYSIS: 1000.0000 [IU] | INTRAMUSCULAR | Status: DC | PRN

## 2021-03-17 MED ORDER — PENTAFLUOROPROP-TETRAFLUOROETH EX AERO: 1.0000 "application " | INHALATION_SPRAY | CUTANEOUS | Status: DC | PRN

## 2021-03-17 NOTE — Progress Notes (Signed)
PROGRESS NOTE  [name] [id number] DOB: 26-Jun-1986 DOA: 03/11/2021 PCP: Gildardo Pounds, NP  HPI/Recap of past 24 hours: This is a 35 year old African-American male with end-stage renal disease on hemodialysis Monday Wednesday and Friday, hypertension, hyperlipidemia, type 2 diabetes mellitus, diabetic foot ulcer, legally blind who is a resident of [facility].  Patient initially presented to the emergency department for hematemesis and he had missed a couple of days of his hemodialysis he became short of breath and was admitted for hemodialysis. Patient still awaiting insurance authorization to be discharged to SNF  Subjective: Patient seen and examined at bedside he was sitting on the side of the bed with his head covered with his blanket did not do too much interaction except to answer questions yes or no.  But denies any acute pain or other complaints  Assessment/Plan: Principal Problem:   Acute respiratory failure with hypoxia (HCC) Active Problems:   Essential hypertension   DM2 (diabetes mellitus, type 2) (HCC)   Hypervolemia associated with renal insufficiency   Hypertrophic cardiomyopathy (HCC)   ESRD (end stage renal disease) on dialysis ([place])   1.  End-stage renal disease on hemodialysis Monday Wednesday Friday Volume overload secondary to renal hemodialysis Acute hypoxic respiratory failure. Patient has undergone hemodialysis and is doing much better  2.  Hematemesis.  They reported 3 episodes of hematemesis at the facility Currently no bleeding since admission Hemoglobin is stable  3.  Type 2 diabetes mellitus uncontrolled Hemoglobin A1c was 12.1 in February 08, 2021 Continue current management with Premeal Lantus as well as sliding scale  4.  Diabetic foot ulcer Continue wound care per wound nurse  5.  Legally blind continue supportive care  Code Status: Full  Severity of Illness: The appropriate patient status for this patient is INPATIENT.  Inpatient status is judged to be reasonable and necessary in order to provide the required intensity of service to ensure the patient's safety. The patient's presenting symptoms, physical exam findings, and initial radiographic and laboratory data in the context of their chronic comorbidities is felt to place them at high risk for further clinical deterioration. Furthermore, it is not anticipated that the patient will be medically stable for discharge from the hospital within 2 midnights of admission. The following factors support the patient status of inpatient.   " Needing hemodialysis and waiting for SNF   * I certify that at the point of admission it is my clinical judgment that the patient will require inpatient hospital care spanning beyond 2 midnights from the point of admission due to high intensity of service, high risk for further deterioration and high frequency of surveillance required.*   Family Communication: Patient  Disposition Plan:   Status is: Inpatient   Dispo: The patient is from: SNF              Anticipated d/c is to: SNF              Anticipated d/c date is: Waiting for insurance authorization              Patient currently not medically stable for discharge  Consultants: Nephrology  Procedures: Hemodialysis   Antimicrobials: None  DVT prophylaxis: SCD   Objective: Vitals:   03/16/21 1259 03/16/21 1811 03/16/21 2124 03/17/21 0516  BP: (!) 165/99 (!) 171/105 (!) 166/100 (!) 170/107  Pulse: 80 79 77 78  Resp: '18 18 18 18  '$ Temp: 98.4 F (36.9 C) 98.2 F (36.8 C) 98.6 F (37 C) 98.6  F (37 C)  TempSrc: Oral Oral    SpO2: 95% 97% 98% 93%  Weight:   99.3 kg     Intake/Output Summary (Last 24 hours) at 03/17/2021 0831 Last data filed at 03/17/2021 0600 Gross per 24 hour  Intake 480 ml  Output 4000 ml  Net -3520 ml   Filed Weights   03/13/21 1535 03/16/21 0801 03/16/21 2124  Weight: 92.2 kg 99.3 kg 99.3 kg   Body mass index is 29.69  kg/m.  Exam:  General: 35 y.o. year-old male well developed well nourished in no acute distress.  Alert and oriented x3.  Legally blind Cardiovascular: Regular rate and rhythm with no rubs or gallops.  No thyromegaly or JVD noted.   Respiratory: Clear to auscultation with no wheezes or rales. Good inspiratory effort. Abdomen: Soft nontender nondistended with normal bowel sounds x4 quadrants. Musculoskeletal: No lower extremity edema. 2/4 pulses in all 4 extremities. Skin: Lower extremity ulcer Psychiatry: Mood is appropriate for condition and setting Neurology:    Data Reviewed: CBC: Recent Labs  Lab 03/11/21 0332 03/12/21 0905 03/12/21 1409 03/13/21 0450 03/14/21 0237 03/15/21 0020 03/16/21 0232  WBC 6.4 6.4  --  7.1 6.0 7.0 6.4  NEUTROABS 5.1  --   --   --  4.3 4.9 4.6  HGB 9.2* 8.9* 10.9* 8.7* 8.5* 8.5* 8.6*  HCT 29.4* 28.6* 32.0* 27.9* 27.1* 26.5* 26.6*  MCV 83.8 84.4  --  83.5 83.1 82.8 82.1  PLT 207 208  --  227 218 233 [id number]   Basic Metabolic Panel: Recent Labs  Lab 03/12/21 0905 03/12/21 1223 03/12/21 1409 03/13/21 0450 03/14/21 0237 03/15/21 0020 03/16/21 0232  NA 135 136 136 134* 135 129* 128*  K 5.7* 4.1 4.1 4.5 4.3 4.8 5.2*  CL 95* 95*  --  94* 96* 91* 92*  CO2 24 28  --  '27 29 27 25  '$ GLUCOSE 99 141*  --  112* 130* 194* 171*  BUN 82* 44*  --  57* 40* 53* 69*  CREATININE 13.59* 8.70*  --  10.65* 8.24* 9.92* 11.75*  CALCIUM 8.5* 8.7*  --  8.4* 8.3* 8.5* 8.8*  PHOS 6.0*  --   --   --   --   --   --    GFR: Estimated Creatinine Clearance: 10.6 mL/min (A) (by C-G formula based on SCr of 11.75 mg/dL (H)). Liver Function Tests: Recent Labs  Lab 03/11/21 0332 03/12/21 0905  AST 28  --   ALT 61*  --   ALKPHOS 202*  --   BILITOT 1.2  --   PROT 7.5  --   ALBUMIN 3.6 3.4*   Recent Labs  Lab 03/11/21 0332  LIPASE 26   No results for input(s): AMMONIA in the last 168 hours. Coagulation Profile: No results for input(s): INR, PROTIME in the last  168 hours. Cardiac Enzymes: No results for input(s): CKTOTAL, CKMB, CKMBINDEX, TROPONINI in the last 168 hours. BNP (last 3 results) No results for input(s): PROBNP in the last 8760 hours. HbA1C: No results for input(s): HGBA1C in the last 72 hours. CBG: Recent Labs  Lab 03/16/21 0639 03/16/21 1301 03/16/21 1630 03/16/21 2125 03/17/21 0644  GLUCAP 148* 145* 175* 126* 105*   Lipid Profile: No results for input(s): CHOL, HDL, LDLCALC, TRIG, CHOLHDL, LDLDIRECT in the last 72 hours. Thyroid Function Tests: No results for input(s): TSH, T4TOTAL, FREET4, T3FREE, THYROIDAB in the last 72 hours. Anemia Panel: No results for input(s): VITAMINB12, FOLATE, FERRITIN, TIBC, IRON,  RETICCTPCT in the last 72 hours. Urine analysis:    Component Value Date/Time   COLORURINE YELLOW 11/25/2020 0301   APPEARANCEUR CLEAR 11/25/2020 0301   LABSPEC 1.016 11/25/2020 0301   PHURINE 7.0 11/25/2020 0301   GLUCOSEU 50 (A) 11/25/2020 0301   HGBUR SMALL (A) 11/25/2020 0301   BILIRUBINUR NEGATIVE 11/25/2020 0301   KETONESUR NEGATIVE 11/25/2020 0301   PROTEINUR >=300 (A) 11/25/2020 0301   UROBILINOGEN 0.2 07/16/2014 1747   NITRITE NEGATIVE 11/25/2020 0301   LEUKOCYTESUR NEGATIVE 11/25/2020 0301   Sepsis Labs: '@LABRCNTIP'$ (procalcitonin:4,lacticidven:4)  ) Recent Results (from the past 240 hour(s))  Resp Panel by RT-PCR (Flu A&B, Covid) Nasopharyngeal Swab     Status: None   Collection Time: 03/11/21  4:33 PM   Specimen: Nasopharyngeal Swab; Nasopharyngeal(NP) swabs in vial transport medium  Result Value Ref Range Status   SARS Coronavirus 2 by RT PCR NEGATIVE NEGATIVE Final    Comment: (NOTE) SARS-CoV-2 target nucleic acids are NOT DETECTED.  The SARS-CoV-2 RNA is generally detectable in upper respiratory specimens during the acute phase of infection. The lowest concentration of SARS-CoV-2 viral copies this assay can detect is 138 copies/mL. A negative result does not preclude  SARS-Cov-2 infection and should not be used as the sole basis for treatment or other patient management decisions. A negative result may occur with  improper specimen collection/handling, submission of specimen other than nasopharyngeal swab, presence of viral mutation(s) within the areas targeted by this assay, and inadequate number of viral copies(<138 copies/mL). A negative result must be combined with clinical observations, patient history, and epidemiological information. The expected result is Negative.  Fact Sheet for Patients:  EntrepreneurPulse.com.au  Fact Sheet for Healthcare Providers:  IncredibleEmployment.be  This test is no t yet approved or cleared by the [place] FDA and  has been authorized for detection and/or diagnosis of SARS-CoV-2 by FDA under an Emergency Use Authorization (EUA). This EUA will remain  in effect (meaning this test can be used) for the duration of the COVID-19 declaration under Section 564(b)(1) of the Act, 21 U.S.C.section 360bbb-3(b)(1), unless the authorization is terminated  or revoked sooner.       Influenza A by PCR NEGATIVE NEGATIVE Final   Influenza B by PCR NEGATIVE NEGATIVE Final    Comment: (NOTE) The Xpert Xpress SARS-CoV-2/FLU/RSV plus assay is intended as an aid in the diagnosis of influenza from Nasopharyngeal swab specimens and should not be used as a sole basis for treatment. Nasal washings and aspirates are unacceptable for Xpert Xpress SARS-CoV-2/FLU/RSV testing.  Fact Sheet for Patients: EntrepreneurPulse.com.au  Fact Sheet for Healthcare Providers: IncredibleEmployment.be  This test is not yet approved or cleared by the [place] FDA and has been authorized for detection and/or diagnosis of SARS-CoV-2 by FDA under an Emergency Use Authorization (EUA). This EUA will remain in effect (meaning this test can be used) for the duration of  the COVID-19 declaration under Section 564(b)(1) of the Act, 21 U.S.C. section 360bbb-3(b)(1), unless the authorization is terminated or revoked.  Performed at [facility], [place] [address], [place], [place] [address]       Studies: No results found.  Scheduled Meds:  (feeding supplement) PROSource Plus  30 mL Oral TID BM   atorvastatin  40 mg Oral QHS   carvedilol  25 mg Oral BID WC   cloNIDine  0.1 mg Oral TID   doxercalciferol  5 mcg Intravenous Q M,W,F-HD   feeding supplement ([name] CARB STEADY)  237 mL Oral BID BM  imipramine  25 mg Oral QHS   insulin aspart  0-6 Units Subcutaneous TID WC   insulin glargine-yfgn  16 Units Subcutaneous QHS   losartan  50 mg Oral QHS   multivitamin  1 tablet Oral QHS   sevelamer carbonate  1,600 mg Oral TID WC   sodium chloride flush  3 mL Intravenous Q12H    Continuous Infusions:   LOS: 1 day     Cristal Deer, MD [facility]  To reach me or the doctor on call, go to: [email or web address] Password [email or web address]  03/17/2021, 8:31 AM

## 2021-03-17 NOTE — Plan of Care (Signed)
  Problem: Clinical Measurements: Goal: Ability to maintain clinical measurements within normal limits will improve Outcome: Progressing   

## 2021-03-17 NOTE — Progress Notes (Signed)
[facility] Progress Note   Subjective:    Seen and examined at bedside. Tolerated yesterday's HD with UF 4L. Plan for HD 9/7.  Objective Vitals:   03/16/21 1811 03/16/21 2124 03/17/21 0516 03/17/21 0850  BP: (!) 171/105 (!) 166/100 (!) 170/107 (!) 184/117  Pulse: 79 77 78 83  Resp: '18 18 18 18  '$ Temp: 98.2 F (36.8 C) 98.6 F (37 C) 98.6 F (37 C) 98.3 F (36.8 C)  TempSrc: Oral     SpO2: 97% 98% 93% 100%  Weight:  99.3 kg     Physical Exam General: Chronically ill-appearing; NAD; Blind Heart: S1 and S2; No murmurs, gallops, or rubs. Lungs: Clear, no wheezing, rales, or rhonchi Abdomen: Soft and non-tender Extremities: No edema BLLE Dialysis Access: L AVF (+) bruit/thrill   Filed Weights   03/13/21 1535 03/16/21 0801 03/16/21 2124  Weight: 92.2 kg 99.3 kg 99.3 kg    Intake/Output Summary (Last 24 hours) at 03/17/2021 1617 Last data filed at 03/17/2021 1300 Gross per 24 hour  Intake 740 ml  Output 0 ml  Net 740 ml    Additional Objective Labs: Basic Metabolic Panel: Recent Labs  Lab 03/12/21 0905 03/12/21 1223 03/14/21 0237 03/15/21 0020 03/16/21 0232  NA 135   < > 135 129* 128*  K 5.7*   < > 4.3 4.8 5.2*  CL 95*   < > 96* 91* 92*  CO2 24   < > '29 27 25  '$ GLUCOSE 99   < > 130* 194* 171*  BUN 82*   < > 40* 53* 69*  CREATININE 13.59*   < > 8.24* 9.92* 11.75*  CALCIUM 8.5*   < > 8.3* 8.5* 8.8*  PHOS 6.0*  --   --   --   --    < > = values in this interval not displayed.   Liver Function Tests: Recent Labs  Lab 03/11/21 0332 03/12/21 0905  AST 28  --   ALT 61*  --   ALKPHOS 202*  --   BILITOT 1.2  --   PROT 7.5  --   ALBUMIN 3.6 3.4*   Recent Labs  Lab 03/11/21 0332  LIPASE 26   CBC: Recent Labs  Lab 03/12/21 0905 03/12/21 1409 03/13/21 0450 03/14/21 0237 03/15/21 0020 03/16/21 0232  WBC 6.4  --  7.1 6.0 7.0 6.4  NEUTROABS  --   --   --  4.3 4.9 4.6  HGB 8.9*   < > 8.7* 8.5* 8.5* 8.6*  HCT 28.6*   < > 27.9* 27.1* 26.5*  26.6*  MCV 84.4  --  83.5 83.1 82.8 82.1  PLT 208  --  227 218 233 235   < > = values in this interval not displayed.   Blood Culture    Component Value Date/Time   SDES BLOOD BLOOD RIGHT FOREARM 11/24/2020 1955   SPECREQUEST  11/24/2020 1955    BOTTLES DRAWN AEROBIC AND ANAEROBIC Blood Culture adequate volume   CULT  11/24/2020 1955    NO GROWTH 6 DAYS Performed at [facility], [place] [address] [place], [place] [address]    REPTSTATUS 11/30/2020 FINAL 11/24/2020 1955    Cardiac Enzymes: No results for input(s): CKTOTAL, CKMB, CKMBINDEX, TROPONINI in the last 168 hours. CBG: Recent Labs  Lab 03/16/21 1301 03/16/21 1630 03/16/21 2125 03/17/21 0644 03/17/21 1152  GLUCAP 145* 175* 126* 105* 145*   Iron Studies: No results for input(s): IRON, TIBC, TRANSFERRIN, FERRITIN in the last  72 hours. Lab Results  Component Value Date   INR 1.1 08/11/2020   INR 1.1 05/09/2020   INR 1.0 02/29/2020   Studies/Results: No results found.  Medications:   (feeding supplement) PROSource Plus  30 mL Oral TID BM   atorvastatin  40 mg Oral QHS   carvedilol  25 mg Oral BID WC   cloNIDine  0.1 mg Oral TID   doxercalciferol  5 mcg Intravenous Q M,W,F-HD   feeding supplement ([name] CARB STEADY)  237 mL Oral BID BM   imipramine  25 mg Oral QHS   insulin aspart  0-6 Units Subcutaneous TID WC   insulin glargine-yfgn  16 Units Subcutaneous QHS   losartan  50 mg Oral QHS   multivitamin  1 tablet Oral QHS   sevelamer carbonate  1,600 mg Oral TID WC   sodium chloride flush  3 mL Intravenous Q12H    Dialysis Orders: SW MWF  Dialysis Orders: [facility] MWF 4 hrs 180NRe 450/800 86.5 kg 2.0 K/2.0 Ca L AVF -Heparin-none -[name] 5 mcg IV TIW -Venofer 100 mcg IV X 10 doses 3/7 doses given last dose 02/04/21 -Mircera 225 mcg IV q 2 weeks (last dose 02/02/2021)  Assessment/Plan: Volume overload: still over EDW but dyspnea improved. No edema on exam. Fluids restrictions discussed. Will obtain  standing weight pre-HD tomorrow. Hypertrophic CM - new Dx recently. Acute systolic HF-EF  [id number]  ESRD: Usual MWF schedule -> Tolerated yesterday's HD with UF 4L. Plan for HD 9/7 if he is till here. HHS/DMT2: sp IV insulin on SQ insulin now AMS: resolved, thought to be related to elevated glucose +/- seizure. CT of head without acute changes/MRI neg as well. Neuro involved no anti sz meds req COVID 19- Pt is off COVID isolation and COVID neg in the ED.  HTN:  Variable BP. Continue home meds: Coreg 25 bid, Losartan 50. Continue to work to get volume down with next few HD treatments. Anemia: Hgb 8.6 -  s/p Aranesp 153mg on 8/29-should receive dose today.  Metabolic bone disease: Ca ok, Phos high and Renvela was increased to 2/meals. Continue VDRA.  R toe ulcer: S/p course of PO doxycycline  Nutrition -Renal Carb mod diet, on protein supplements, renal vits. Disposition -   Prev living with aunt but unable to care for him and was d/c to SNF. Awaiting insurance authorization to return to SNF.  CTobie Poet NP [facility] 03/17/2021,4:17 PM  LOS: 1 day

## 2021-03-18 DIAGNOSIS — J9601 Acute respiratory failure with hypoxia: Secondary | ICD-10-CM | POA: Diagnosis not present

## 2021-03-18 LAB — RENAL FUNCTION PANEL
Albumin: 3 g/dL — ABNORMAL LOW (ref 3.5–5.0)
Anion gap: 14 (ref 5–15)
BUN: 78 mg/dL — ABNORMAL HIGH (ref 6–20)
CO2: 24 mmol/L (ref 22–32)
Calcium: 8.7 mg/dL — ABNORMAL LOW (ref 8.9–10.3)
Chloride: 91 mmol/L — ABNORMAL LOW (ref 98–111)
Creatinine, Ser: 11.18 mg/dL — ABNORMAL HIGH (ref 0.61–1.24)
GFR, Estimated: 6 mL/min — ABNORMAL LOW (ref >60)
Glucose, Bld: 113 mg/dL — ABNORMAL HIGH (ref 70–99)
Phosphorus: 6.2 mg/dL — ABNORMAL HIGH (ref 2.5–4.6)
Potassium: 5.4 mmol/L — ABNORMAL HIGH (ref 3.5–5.1)
Sodium: 129 mmol/L — ABNORMAL LOW (ref 135–145)

## 2021-03-18 LAB — CBC WITH DIFFERENTIAL/PLATELET
Abs Immature Granulocytes: 0.03 10*3/uL (ref 0.00–0.07)
Basophils Absolute: 0 10*3/uL (ref 0.0–0.1)
Basophils Relative: 0 %
Eosinophils Absolute: 0.1 10*3/uL (ref 0.0–0.5)
Eosinophils Relative: 2 %
HCT: 25.7 % — ABNORMAL LOW (ref 39.0–52.0)
Hemoglobin: 8.3 g/dL — ABNORMAL LOW (ref 13.0–17.0)
Immature Granulocytes: 1 %
Lymphocytes Relative: 19 %
Lymphs Abs: 1.1 10*3/uL (ref 0.7–4.0)
MCH: 26 pg (ref 26.0–34.0)
MCHC: 32.3 g/dL (ref 30.0–36.0)
MCV: 80.6 fL (ref 80.0–100.0)
Monocytes Absolute: 0.7 10*3/uL (ref 0.1–1.0)
Monocytes Relative: 12 %
Neutro Abs: 4 10*3/uL (ref 1.7–7.7)
Neutrophils Relative %: 66 %
Platelets: 227 10*3/uL (ref 150–400)
RBC: 3.19 MIL/uL — ABNORMAL LOW (ref 4.22–5.81)
RDW: 18.1 % — ABNORMAL HIGH (ref 11.5–15.5)
WBC: 6 10*3/uL (ref 4.0–10.5)
nRBC: 0 % (ref 0.0–0.2)

## 2021-03-18 LAB — GLUCOSE, CAPILLARY
Glucose-Capillary: 100 mg/dL — ABNORMAL HIGH (ref 70–99)
Glucose-Capillary: 107 mg/dL — ABNORMAL HIGH (ref 70–99)
Glucose-Capillary: 204 mg/dL — ABNORMAL HIGH (ref 70–99)
Glucose-Capillary: 98 mg/dL (ref 70–99)

## 2021-03-18 LAB — HEPATITIS PANEL, ACUTE
HCV Ab: NONREACTIVE
Hep A IgM: NONREACTIVE
Hep B C IgM: NONREACTIVE
Hepatitis B Surface Ag: NONREACTIVE

## 2021-03-18 LAB — PHOSPHORUS: Phosphorus: 6.2 mg/dL — ABNORMAL HIGH (ref 2.5–4.6)

## 2021-03-18 MED ORDER — HEPATITIS B IMMUNE GLOBULIN IM SOLN
0.0600 mL/kg | Freq: Once | INTRAMUSCULAR | Status: AC
  Administered 2021-03-18: 5.8 mL via INTRAMUSCULAR
  Filled 2021-03-18: qty 10

## 2021-03-18 MED ORDER — HEPATITIS B VAC RECOMBINANT 10 MCG/0.5ML IJ SUSP
2.0000 mL | Freq: Once | INTRAMUSCULAR | Status: AC
  Administered 2021-03-18: 2 mL via INTRAMUSCULAR
  Filled 2021-03-18: qty 2

## 2021-03-18 MED ORDER — PANTOPRAZOLE SODIUM 40 MG PO TBEC: 40.0000 mg | DELAYED_RELEASE_TABLET | Freq: Every day | ORAL | 0 refills | Status: DC

## 2021-03-18 MED ORDER — HEPATITIS B VAC RECOMBINANT 10 MCG/0.5ML IJ SUSP
0.5000 mL | Freq: Once | INTRAMUSCULAR | Status: DC
  Filled 2021-03-18: qty 0.5

## 2021-03-18 NOTE — Progress Notes (Addendum)
[name] Progress Note   Subjective:    Seen and examined patient during HD. So far tolerating UFG 5L. No acute issues.  Objective Vitals:   03/18/21 1000 03/18/21 1030 03/18/21 1100 03/18/21 1130  BP: (!) 162/97 (!) 161/93 (!) 155/91 (!) 165/108  Pulse: 69 70 71 72  Resp:      Temp:      TempSrc:      SpO2:      Weight:       Physical Exam General: Chronically ill-appearing; NAD; Blind Heart: S1 and S2; No murmurs, gallops, or rubs. Lungs: Clear, no wheezing, rales, or rhonchi Abdomen: Soft and non-tender Extremities: No edema BLLE Dialysis Access: L AVF (+) bruit/thrill   Filed Weights   03/16/21 0801 03/16/21 2124 03/18/21 0827  Weight: 99.3 kg 99.3 kg 97.4 kg    Intake/Output Summary (Last 24 hours) at 03/18/2021 1222 Last data filed at 03/18/2021 0600 Gross per 24 hour  Intake 660 ml  Output 0 ml  Net 660 ml    Additional Objective Labs: Basic Metabolic Panel: Recent Labs  Lab 03/12/21 0905 03/12/21 1223 03/15/21 0020 03/16/21 0232 03/18/21 0228  NA 135   < > 129* 128* 129*  K 5.7*   < > 4.8 5.2* 5.4*  CL 95*   < > 91* 92* 91*  CO2 24   < > '27 25 24  '$ GLUCOSE 99   < > 194* 171* 113*  BUN 82*   < > 53* 69* 78*  CREATININE 13.59*   < > 9.92* 11.75* 11.18*  CALCIUM 8.5*   < > 8.5* 8.8* 8.7*  PHOS 6.0*  --   --   --  6.2*  6.2*   < > = values in this interval not displayed.   Liver Function Tests: Recent Labs  Lab 03/12/21 0905 03/18/21 0228  ALBUMIN 3.4* 3.0*   No results for input(s): LIPASE, AMYLASE in the last 168 hours. CBC: Recent Labs  Lab 03/13/21 0450 03/13/21 0450 03/14/21 0237 03/15/21 0020 03/16/21 0232 03/18/21 0228  WBC 7.1  --  6.0 7.0 6.4 6.0  NEUTROABS  --    < > 4.3 4.9 4.6 4.0  HGB 8.7*  --  8.5* 8.5* 8.6* 8.3*  HCT 27.9*  --  27.1* 26.5* 26.6* 25.7*  MCV 83.5  --  83.1 82.8 82.1 80.6  PLT 227  --  218 233 235 227   < > = values in this interval not displayed.   Blood Culture    Component Value  Date/Time   SDES BLOOD BLOOD RIGHT FOREARM 11/24/2020 1955   SPECREQUEST  11/24/2020 1955    BOTTLES DRAWN AEROBIC AND ANAEROBIC Blood Culture adequate volume   CULT  11/24/2020 1955    NO GROWTH 6 DAYS Performed at [facility], [place] [address], [place], [place] [address]    REPTSTATUS 11/30/2020 FINAL 11/24/2020 1955    Cardiac Enzymes: No results for input(s): CKTOTAL, CKMB, CKMBINDEX, TROPONINI in the last 168 hours. CBG: Recent Labs  Lab 03/17/21 0644 03/17/21 1152 03/17/21 1648 03/17/21 2059 03/18/21 0703  GLUCAP 105* 145* 133* 191* 100*   Iron Studies: No results for input(s): IRON, TIBC, TRANSFERRIN, FERRITIN in the last 72 hours. Lab Results  Component Value Date   INR 1.1 08/11/2020   INR 1.1 05/09/2020   INR 1.0 02/29/2020   Studies/Results: No results found.  Medications:  sodium chloride     sodium chloride      (feeding supplement)  PROSource Plus  30 mL Oral TID BM   atorvastatin  40 mg Oral QHS   carvedilol  25 mg Oral BID WC   cloNIDine  0.1 mg Oral TID   doxercalciferol  5 mcg Intravenous Q M,W,F-HD   feeding supplement ([name] CARB STEADY)  237 mL Oral BID BM   hepatitis B immune globulin  0.06 mL/kg Intramuscular Once   imipramine  25 mg Oral QHS   insulin aspart  0-6 Units Subcutaneous TID WC   insulin glargine-yfgn  16 Units Subcutaneous QHS   losartan  50 mg Oral QHS   multivitamin  1 tablet Oral QHS   sevelamer carbonate  1,600 mg Oral TID WC   sodium chloride flush  3 mL Intravenous Q12H    Dialysis Orders: SW MWF  Dialysis Orders: [facility] MWF 4 hrs 180NRe 450/800 86.5 kg 2.0 K/2.0 Ca L AVF -Heparin-none -[name] 5 mcg IV TIW -Venofer 100 mcg IV X 10 doses 3/7 doses given last dose 02/04/21 -Mircera 225 mcg IV q 2 weeks (last dose 02/02/2021)  Assessment/Plan: Volume overload: still over EDW but dyspnea improved. No edema on exam. Fluids restrictions discussed. Tolerating UFG 5L today. Hypertrophic CM - new Dx recently. Acute  systolic HF-EF  [id number]  Potential Hep B exposure - in HD unit over the weekend. Pt tested today and is negative for SAg and negative for anti Hep B Ab's. For post-exposure prophylaxis, pt will need Hep B vaccination (w/ 1st dose today) and 2 doses of HBIG 4 wks apart, 1st dose today. The follow up vaccination shots will be given at his HD unit. The follow up HBIG shot will be handled by ID team. He needs HepB SAg retest in 3-4 mos, this can also be done at the OP HD unit. I explained what happened to the patient and questions were answered.  ESRD: Usual MWF schedule -> Tolerated yesterday's HD with UF 4L. On HD-push UF. HHS/DMT2: sp IV insulin on SQ insulin now AMS: resolved, thought to be related to elevated glucose +/- seizure. CT of head without acute changes/MRI neg as well. Neuro involved no anti sz meds req COVID 19- Pt is off COVID isolation and COVID neg in the ED.  HTN:  Variable BP. Continue home meds: Coreg 25 bid, Losartan 50. Continue to work to get volume down with next few HD treatments. Anemia: Hgb 8.3- s/p Aranesp 167mg on 8/29-should receive dose today.  Metabolic bone disease: Ca ok, Phos high and Renvela was increased to 2/meals. Continue VDRA.  R toe ulcer: S/p course of PO doxycycline  Nutrition -Renal Carb mod diet, on protein supplements, renal vits. Disposition -   Prev living with aunt but unable to care for him and was d/c to SNF. Awaiting insurance authorization to return to SNF.  CTobie Poet NP [facility] 03/18/2021,12:22 PM  LOS: 1 day    Pt seen, examined and agree w assess/plan as above with additions as indicated.  [facility] 03/18/2021, 2:04 PM

## 2021-03-18 NOTE — Plan of Care (Signed)
  Problem: Clinical Measurements: Goal: Ability to maintain clinical measurements within normal limits will improve Outcome: Completed/Met

## 2021-03-18 NOTE — TOC Transition Note (Addendum)
Transition of Care (TOC) - CM/SW Discharge Note [place] [phone]    Patient Details  Name: [name] MRN: [id number] Date of Birth: June 18, 1986  Transition of Care [facility]) CM/SW Contact:  Sable Feil, LCSW Phone Number: 03/18/2021, 4:10 PM   Clinical Narrative:  Patient medically stable and returning to [facility]. Facility contacted and can accept patient back. Discharge clinicals transmitted to facility and patient's aunt Marchelle Folks [phone]) contacted, but could not leave a voicemail. HIPPA compliant text sent regarding discharge. Patient is also aware of today's discharge. [name] will be transported by non-emergency ambulance transport ([facility]). A COVID test was not needed by facility. Patient has to receive a Hep B shot before he could be discharged.    Call made to admissions director at [facility] (4:15 pm) and informed her that patient was being given HEP B shot and patient is #23 on [facility]'s list.    4:42 pm: Received a call from Ms. Wilson in response to CSW's text. Informed her that patient is discharging back to [place] this evening. She is hopeful that at some point patient can move to a group home due to his age.     Final next level of care: [facility]) Barriers to Discharge: Barriers Resolved   Patient Goals and CMS Choice Patient states their goals for this hospitalization and ongoing recovery are:: Patient agreeable to returning to [place] at discharge CMS Medicare.gov Compare Post Acute Care list provided to:: Other (Comment Required) (not needed as patient returning to [facility]) Choice offered to / list presented to : NA  Discharge Placement   Existing PASRR number confirmed : 03/13/21          Patient chooses bed at:  [facility]) Patient to be transferred to facility by: Non-emergency ambulance transport Name of  family member notified: Deatra Robinson [id number] Patient and family notified of of transfer: 03/18/21  Discharge Plan and Services In-house Referral: Clinical Social Work   Post Acute Care Choice: [place]                               Social Determinants of Health (SDOH) Interventions  No SDOH interventions requested or needed at discharge.   Readmission Risk Interventions Readmission Risk Prevention Plan 03/05/2021 11/27/2020 02/29/2020  Transportation Screening Complete Complete Complete  Home Care Screening - - Complete  Medication Review ([place]) Complete Complete -  PCP or Specialist appointment within 3-5 days of discharge Complete Complete -  [place] or Home Care Consult Complete Complete -  SW Recovery Care/Counseling Consult Complete Complete -  Palliative Care Screening Not Applicable Not Applicable -  [place] Complete Not Applicable -  Some recent data might be hidden

## 2021-03-18 NOTE — Progress Notes (Signed)
ID PROGRESS NOTE  Patient is receiving HBIG and hepatitis B vaccine today after hep B exposure. We will see him back in 4 wks in the ID clinic for the 2nd dose of HBIG.  We will call patient or he can call [phone] for appt on October 5th  [facility] [place] [facility] [phone]

## 2021-03-18 NOTE — Discharge Summary (Addendum)
Physician Discharge Summary  [name] [id number] DOB: 1986-01-15 DOA: 03/11/2021  PCP: James Pounds, NP  Admit date: 03/11/2021 Discharge date: 03/18/2021  Admitted From: NH Disposition:  NH  Recommendations for Outpatient Follow-up:  Follow up with PCP in 1-2 weeks, WITH hd AS AS CHEDULED Please obtain BMP/CBC in one week  Home Health:NO  Equipment/Devices: NONE  Discharge Condition: Stable Code Status:   Code Status: Full Code Diet recommendation:  Diet Order             Diet renal/carb modified with fluid restriction Diet-HS Snack? Nothing; Fluid restriction: 1200 mL Fluid; Room service appropriate? Yes; Fluid consistency: Thin  Diet effective now                   Brief/Interim Summary: 35 year old male with ESRD on HD MWF, HTN, HLD, type 2 diabetes, diabetic foot ulcer, legally blind resident of [place] initially presented to the ED with hematemesis and had missed couple of days of his hemodialysis, became short of breath while waiting for return back and was admitted for hemodialysis.  Patient has been waiting for insurance approval.  Patient has had no hematemesis since admission and hemoglobin has been stable. He has been getting a skilled dialysis, okay for discharge back to facility as per nephrology. Last dialysis today 9/7.  Discharge Diagnoses:  Acute respiratory failure with hypoxia from missed dialysis  ESRD on HD MWF  Stable respiratory status.  On room air.  Back on scheduled dialysis.  Stable for discharge after dialysis today.    Episode of hematemesis at the facility but none since admission..  Hemoglobin stable.  Keep on PPI Recent Labs  Lab 03/13/21 0450 03/14/21 0237 03/15/21 0020 03/16/21 0232 03/18/21 0228  HGB 8.7* 8.5* 8.5* 8.6* 8.3*  HCT 27.9* 27.1* 26.5* 26.6* 25.7*    Essential hypertension: BP fairly stable.  Continue home meds Type 2 diabetes mellitus uncontrolled:Hemoglobin A1c was 12.1 in February 08, 2021 Continue  home meds Diabetic foot ulcer:Continue wound care per wound nurse Legally blind continue supportive care  Potential Hep B exposure - in HD unit over the weekend. Pt tested today and is negative for SAg and negative for anti Hep B Ab's.For post-exposure prophylaxis, pt will need Hep B vaccination (w/ 1st dose today) and 2 doses of HBIG 4 wks apart, 1st dose today. The follow up vaccination shots will be given at his HD unit. The follow up HBIG shot will be handled by ID team. He needs HepB SAg retest in 3-4 mos, this can also be done at the OP HD unit. Dr Iran Ouch explained what happened to the patient and questions were answered.  Dr Baxter Flattery for ID will follow up.  Consults: Nephrology  Subjective: Alert awake ,Had dialysis this morning. Discharge Exam: Vitals:   03/18/21 0900 03/18/21 0930  BP: (!) 163/102 (!) 156/93  Pulse: 70 69  Resp:    Temp:    SpO2:     General: Pt is alert, awake, not in acute distress Cardiovascular: RRR, S1/S2 +, no rubs, no gallops Respiratory: CTA bilaterally, no wheezing, no rhonchi Abdominal: Soft, NT, ND, bowel sounds + Extremities: no edema, no cyanosis  Discharge Instructions  Discharge Instructions     Discharge instructions   Complete by: As directed    Check CB/BMP in 1 week.  Please call call MD or return to ER for similar or worsening recurring problem that brought you to hospital or if any fever,nausea/vomiting,abdominal pain, uncontrolled pain, chest pain,  shortness of breath or any other alarming symptoms.  Please follow-up your doctor as instructed in a week time and call the office for appointment.  Please avoid alcohol, smoking, or any other illicit substance and maintain healthy habits including taking your regular medications as prescribed.  You were cared for by a hospitalist during your hospital stay. If you have any questions about your discharge medications or the care you received while you were in the hospital after you are  discharged, you can call the unit and ask to speak with the hospitalist on call if the hospitalist that took care of you is not available.  Once you are discharged, your primary care physician will handle any further medical issues. Please note that NO REFILLS for any discharge medications will be authorized once you are discharged, as it is imperative that you return to your primary care physician (or establish a relationship with a primary care physician if you do not have one) for your aftercare needs so that they can reassess your need for medications and monitor your lab values   Discharge wound care:   Complete by: As directed    Wound care to right foot 3-4th digits:  Wash with soap and water, rinse with NS and dry thoroughly-particularly between toes. Cover with xeroform gauze [name] # [id number]), top with dry gauze and secure with a few turns of conform bandaging/paper tape      Allergies as of 03/18/2021       Reactions   Amlodipine Nausea And Vomiting, Other (See Comments)   Patient was taking Amlodipine and Hydralazine at the same time, so the reactions came from one of the 2: Lethargy and an all-over feeling of NOT feeling well (also)   Hydralazine Nausea And Vomiting, Other (See Comments)   Patient was taking Hydralazine AND Amlodipine at the same time, so the reactions came from one of the 2: Lethargy and an all-over feeling of NOT feeling well (also)        Medication List     TAKE these medications    acetaminophen 325 MG tablet Commonly known as: TYLENOL Take 2 tablets (650 mg total) by mouth every 4 (four) hours as needed for headache or mild pain.   atorvastatin 40 MG tablet Commonly known as: LIPITOR TAKE 1 TABLET (40 MG TOTAL) BY MOUTH DAILY. What changed: when to take this   carvedilol 25 MG tablet Commonly known as: COREG Take 1 tablet (25 mg total) by mouth 2 (two) times daily with a meal.   doxercalciferol 4 MCG/2ML injection Commonly known as:  [name] Inject 2.5 mLs (5 mcg total) into the vein every Monday, Wednesday, and Friday with hemodialysis.   imipramine 25 MG tablet Commonly known as: TOFRANIL Take 1 tablet (25 mg total) by mouth at bedtime.   insulin glargine-yfgn 100 UNIT/ML injection Commonly known as: SEMGLEE Inject 0.16 mLs (16 Units total) into the skin at bedtime.   insulin lispro 100 UNIT/ML injection Commonly known as: HUMALOG Inject 1-7 Units into the skin See admin instructions. Per sliding scale 3 times daily with meals 151-200= 1 unit 201-250= 2 units 251-300= 3 units 301-350= 5 units 351-400= 7 units Greater than 400 call md   losartan 50 MG tablet Commonly known as: COZAAR Take 1 tablet (50 mg total) by mouth at bedtime.   multivitamin Tabs tablet Take 1 tablet by mouth at bedtime.   pantoprazole 40 MG tablet Commonly known as: Protonix Take 1 tablet (40 mg total) by mouth daily.  Retacrit 20000 UNIT/ML injection Generic drug: epoetin alfa-epbx Inject 20,000 Units into the vein every Monday.   sevelamer carbonate 800 MG tablet Commonly known as: RENVELA Take 2 tablets (1,600 mg total) by mouth 3 (three) times daily with meals.               Discharge Care Instructions  (From admission, onward)           Start     Ordered   03/18/21 0000  Discharge wound care:       Comments: Wound care to right foot 3-4th digits:  Wash with soap and water, rinse with NS and dry thoroughly-particularly between toes. Cover with xeroform gauze [name] # [id number]), top with dry gauze and secure with a few turns of conform bandaging/paper tape   03/18/21 1033            Follow-up Information     Vanga, Tally Due, MD. Schedule an appointment as soon as possible for a visit .   Specialty: Gastroenterology Why: for follow up of GI symptoms Contact information: [place] [address] [phone]         James Pounds, NP .   Specialty: Nurse  Practitioner Why: If symptoms worsen Contact information: [place] [address] [phone]         [facility] EMERGENCY [facility] .   Specialty: Emergency Medicine Contact information: [id number] [place] [address] [phone]               Allergies  Allergen Reactions   Amlodipine Nausea And Vomiting and Other (See Comments)    Patient was taking Amlodipine and Hydralazine at the same time, so the reactions came from one of the 2: Lethargy and an all-over feeling of NOT feeling well (also)   Hydralazine Nausea And Vomiting and Other (See Comments)    Patient was taking Hydralazine AND Amlodipine at the same time, so the reactions came from one of the 2: Lethargy and an all-over feeling of NOT feeling well (also)    The results of significant diagnostics from this hospitalization (including imaging, microbiology, ancillary and laboratory) are listed below for reference.    Microbiology: Recent Results (from the past 240 hour(s))  Resp Panel by RT-PCR (Flu A&B, Covid) Nasopharyngeal Swab     Status: None   Collection Time: 03/11/21  4:33 PM   Specimen: Nasopharyngeal Swab; Nasopharyngeal(NP) swabs in vial transport medium  Result Value Ref Range Status   SARS Coronavirus 2 by RT PCR NEGATIVE NEGATIVE Final    Comment: (NOTE) SARS-CoV-2 target nucleic acids are NOT DETECTED.  The SARS-CoV-2 RNA is generally detectable in upper respiratory specimens during the acute phase of infection. The lowest concentration of SARS-CoV-2 viral copies this assay can detect is 138 copies/mL. A negative result does not preclude SARS-Cov-2 infection and should not be used as the sole basis for treatment or other patient management decisions. A negative result may occur with  improper specimen collection/handling, submission of specimen other than nasopharyngeal swab, presence of viral mutation(s) within  the areas targeted by this assay, and inadequate number of viral copies(<138 copies/mL). A negative result must be combined with clinical observations, patient history, and epidemiological information. The expected result is Negative.  Fact Sheet for Patients:  EntrepreneurPulse.com.au  Fact Sheet for Healthcare Providers:  IncredibleEmployment.be  This test is no t yet approved or cleared by the [place] FDA and  has been  authorized for detection and/or diagnosis of SARS-CoV-2 by FDA under an Emergency Use Authorization (EUA). This EUA will remain  in effect (meaning this test can be used) for the duration of the COVID-19 declaration under Section 564(b)(1) of the Act, 21 U.S.C.section 360bbb-3(b)(1), unless the authorization is terminated  or revoked sooner.       Influenza A by PCR NEGATIVE NEGATIVE Final   Influenza B by PCR NEGATIVE NEGATIVE Final    Comment: (NOTE) The Xpert Xpress SARS-CoV-2/FLU/RSV plus assay is intended as an aid in the diagnosis of influenza from Nasopharyngeal swab specimens and should not be used as a sole basis for treatment. Nasal washings and aspirates are unacceptable for Xpert Xpress SARS-CoV-2/FLU/RSV testing.  Fact Sheet for Patients: EntrepreneurPulse.com.au  Fact Sheet for Healthcare Providers: IncredibleEmployment.be  This test is not yet approved or cleared by the [place] FDA and has been authorized for detection and/or diagnosis of SARS-CoV-2 by FDA under an Emergency Use Authorization (EUA). This EUA will remain in effect (meaning this test can be used) for the duration of the COVID-19 declaration under Section 564(b)(1) of the Act, 21 U.S.C. section 360bbb-3(b)(1), unless the authorization is terminated or revoked.  Performed at [facility], [place] [address] [place], [place] [address]     Procedures/Studies: DG Chest 2 View  Result Date:  02/22/2021 CLINICAL DATA:  Volume overload.  End-stage renal disease. EXAM: CHEST - 2 VIEW COMPARISON:  February 08, 2021 FINDINGS: Stable cardiomegaly. The hila and mediastinum are unchanged. No pneumothorax. No nodules or masses. No focal infiltrates. No overt edema. IMPRESSION: No overt edema.  Suggested mild pulmonary venous congestion. Electronically Signed   By: Dorise Bullion III M.D.   On: 02/22/2021 09:42   CT ABDOMEN PELVIS W CONTRAST  Result Date: 03/11/2021 CLINICAL DATA:  Abdominal pain, acute, nonlocalized EXAM: CT ABDOMEN AND PELVIS WITH CONTRAST TECHNIQUE: Multidetector CT imaging of the abdomen and pelvis was performed using the standard protocol following bolus administration of intravenous contrast. CONTRAST:  27m OMNIPAQUE IOHEXOL 350 MG/ML SOLN COMPARISON:  Ultrasound 03/11/2021, limited abdominal CT 02/29/2020 FINDINGS: Lower chest: Extensive ground-glass opacity with patchy consolidation within the bilateral lung bases, right slightly worse than left. Cardiomegaly with small pericardial effusion. Hepatobiliary: Heterogeneous appearance of the liver. No focal liver lesion is identified. Unremarkable appearance of the gallbladder. No hyperdense gallstone. No intrahepatic biliary dilatation. Pancreas: Unremarkable. No pancreatic ductal dilatation or surrounding inflammatory changes. Spleen: Normal in size without focal abnormality. Adrenals/Urinary Tract: Unremarkable adrenal glands. Horseshoe kidney. No focal renal lesion, stone, or hydronephrosis. Urinary bladder is within normal limits. Stomach/Bowel: Stomach is within normal limits. Appendix appears normal (series 3, image 63). No evidence of bowel wall thickening, distention, or inflammatory changes. Vascular/Lymphatic: Normal caliber aorta. Small vessel calcifications are seen within the pelvis. Lymphadenopathy. Reproductive: Prostate is unremarkable. Other: Small to moderate volume ascites throughout the abdomen and pelvis. No organized  or rim enhancing fluid collections. No pneumoperitoneum. No abdominal wall hernia. Musculoskeletal: Diffuse anasarca.  No acute osseous abnormality. IMPRESSION: 1. Extensive ground-glass opacity with patchy consolidation within the bilateral lung bases, right slightly worse than left. Findings may reflect pulmonary edema, multifocal pneumonia, versus ARDS. 2. Cardiomegaly with small pericardial effusion. 3. Small to moderate volume ascites throughout the abdomen and pelvis. 4. Diffuse anasarca. 5. Heterogeneous appearance of the liver may reflect hepatic venous congestion, acute hepatitis, or possibly fatty infiltration. 6. Horseshoe kidney. 7. Age advanced atherosclerosis. Electronically Signed   By: NDavina PokeD.O.   On: 03/11/2021  13:05   DG Abdomen Acute W/Chest  Result Date: 03/11/2021 CLINICAL DATA:  Abdominal pain.  Vomiting blood EXAM: DG ABDOMEN ACUTE WITH 1 VIEW CHEST COMPARISON:  02/29/2020 FINDINGS: Formed stool is seen in the colon. No concerning mass effect or calcification. No gas dilated bowel. Cardiomegaly and vascular pedicle widening with diffuse hazy pulmonary opacity asymmetric to the right lung. No effusion or pneumothorax. IMPRESSION: 1. Negative abdominal radiograph. 2. Airspace disease asymmetric to the right lung, history suggesting edema or aspiration. Electronically Signed   By: Monte Fantasia M.D.   On: 03/11/2021 04:12   US Abdomen Limited RUQ (LIVER/GB)  Result Date: 03/11/2021 CLINICAL DATA:  Right upper quadrant pain EXAM: ULTRASOUND ABDOMEN LIMITED RIGHT UPPER QUADRANT COMPARISON:  CT abdomen/pelvis 02/29/2020 FINDINGS: Gallbladder: No gallstones or wall thickening visualized. No sonographic Murphy sign noted by sonographer. Common bile duct: Diameter: 8 mm. There is no intrahepatic biliary ductal dilatation. No obstructing lesion or stone is seen. Liver: No focal lesion identified. Within normal limits in parenchymal echogenicity. Portal vein is patent on color  Doppler imaging with normal direction of blood flow towards the liver. Other: There is mild ascites. IMPRESSION: 1. Mild biliary ductal dilatation measuring up to 8 mm. No stone or other obstructing lesion is seen. Correlate with LFTs, and MRCP may be considered if indicated. 2. Mild ascites. Electronically Signed   By: Valetta Mole M.D.   On: 03/11/2021 10:15    Labs: BNP (last 3 results) Recent Labs    08/06/20 2012 02/08/21 1905 03/12/21 1223  BNP 2,067.3* >4,500.0* 123456*   Basic Metabolic Panel: Recent Labs  Lab 03/12/21 0905 03/12/21 1223 03/13/21 0450 03/14/21 0237 03/15/21 0020 03/16/21 0232 03/18/21 0228  NA 135   < > 134* 135 129* 128* 129*  K 5.7*   < > 4.5 4.3 4.8 5.2* 5.4*  CL 95*   < > 94* 96* 91* 92* 91*  CO2 24   < > '27 29 27 25 24  '$ GLUCOSE 99   < > 112* 130* 194* 171* 113*  BUN 82*   < > 57* 40* 53* 69* 78*  CREATININE 13.59*   < > 10.65* 8.24* 9.92* 11.75* 11.18*  CALCIUM 8.5*   < > 8.4* 8.3* 8.5* 8.8* 8.7*  PHOS 6.0*  --   --   --   --   --  6.2*  6.2*   < > = values in this interval not displayed.   Liver Function Tests: Recent Labs  Lab 03/12/21 0905 03/18/21 0228  ALBUMIN 3.4* 3.0*   No results for input(s): LIPASE, AMYLASE in the last 168 hours. No results for input(s): AMMONIA in the last 168 hours. CBC: Recent Labs  Lab 03/13/21 0450 03/14/21 0237 03/15/21 0020 03/16/21 0232 03/18/21 0228  WBC 7.1 6.0 7.0 6.4 6.0  NEUTROABS  --  4.3 4.9 4.6 4.0  HGB 8.7* 8.5* 8.5* 8.6* 8.3*  HCT 27.9* 27.1* 26.5* 26.6* 25.7*  MCV 83.5 83.1 82.8 82.1 80.6  PLT 227 218 233 235 227   Cardiac Enzymes: No results for input(s): CKTOTAL, CKMB, CKMBINDEX, TROPONINI in the last 168 hours. BNP: Invalid input(s): POCBNP CBG: Recent Labs  Lab 03/17/21 0644 03/17/21 1152 03/17/21 1648 03/17/21 2059 03/18/21 0703  GLUCAP 105* 145* 133* 191* 100*   D-Dimer No results for input(s): DDIMER in the last 72 hours. Hgb A1c No results for input(s): HGBA1C  in the last 72 hours. Lipid Profile No results for input(s): CHOL, HDL, LDLCALC, TRIG, CHOLHDL, LDLDIRECT in  the last 72 hours. Thyroid function studies No results for input(s): TSH, T4TOTAL, T3FREE, THYROIDAB in the last 72 hours.  Invalid input(s): FREET3 Anemia work up No results for input(s): VITAMINB12, FOLATE, FERRITIN, TIBC, IRON, RETICCTPCT in the last 72 hours. Urinalysis    Component Value Date/Time   COLORURINE YELLOW 11/25/2020 0301   APPEARANCEUR CLEAR 11/25/2020 0301   LABSPEC 1.016 11/25/2020 0301   PHURINE 7.0 11/25/2020 0301   GLUCOSEU 50 (A) 11/25/2020 0301   HGBUR SMALL (A) 11/25/2020 0301   BILIRUBINUR NEGATIVE 11/25/2020 0301   KETONESUR NEGATIVE 11/25/2020 0301   PROTEINUR >=300 (A) 11/25/2020 0301   UROBILINOGEN 0.2 07/16/2014 1747   NITRITE NEGATIVE 11/25/2020 0301   LEUKOCYTESUR NEGATIVE 11/25/2020 0301   Sepsis Labs Invalid input(s): PROCALCITONIN,  WBC,  LACTICIDVEN Microbiology Recent Results (from the past 240 hour(s))  Resp Panel by RT-PCR (Flu A&B, Covid) Nasopharyngeal Swab     Status: None   Collection Time: 03/11/21  4:33 PM   Specimen: Nasopharyngeal Swab; Nasopharyngeal(NP) swabs in vial transport medium  Result Value Ref Range Status   SARS Coronavirus 2 by RT PCR NEGATIVE NEGATIVE Final    Comment: (NOTE) SARS-CoV-2 target nucleic acids are NOT DETECTED.  The SARS-CoV-2 RNA is generally detectable in upper respiratory specimens during the acute phase of infection. The lowest concentration of SARS-CoV-2 viral copies this assay can detect is 138 copies/mL. A negative result does not preclude SARS-Cov-2 infection and should not be used as the sole basis for treatment or other patient management decisions. A negative result may occur with  improper specimen collection/handling, submission of specimen other than nasopharyngeal swab, presence of viral mutation(s) within the areas targeted by this assay, and inadequate number of  viral copies(<138 copies/mL). A negative result must be combined with clinical observations, patient history, and epidemiological information. The expected result is Negative.  Fact Sheet for Patients:  EntrepreneurPulse.com.au  Fact Sheet for Healthcare Providers:  IncredibleEmployment.be  This test is no t yet approved or cleared by the [place] FDA and  has been authorized for detection and/or diagnosis of SARS-CoV-2 by FDA under an Emergency Use Authorization (EUA). This EUA will remain  in effect (meaning this test can be used) for the duration of the COVID-19 declaration under Section 564(b)(1) of the Act, 21 U.S.C.section 360bbb-3(b)(1), unless the authorization is terminated  or revoked sooner.       Influenza A by PCR NEGATIVE NEGATIVE Final   Influenza B by PCR NEGATIVE NEGATIVE Final    Comment: (NOTE) The Xpert Xpress SARS-CoV-2/FLU/RSV plus assay is intended as an aid in the diagnosis of influenza from Nasopharyngeal swab specimens and should not be used as a sole basis for treatment. Nasal washings and aspirates are unacceptable for Xpert Xpress SARS-CoV-2/FLU/RSV testing.  Fact Sheet for Patients: EntrepreneurPulse.com.au  Fact Sheet for Healthcare Providers: IncredibleEmployment.be  This test is not yet approved or cleared by the [place] FDA and has been authorized for detection and/or diagnosis of SARS-CoV-2 by FDA under an Emergency Use Authorization (EUA). This EUA will remain in effect (meaning this test can be used) for the duration of the COVID-19 declaration under Section 564(b)(1) of the Act, 21 U.S.C. section 360bbb-3(b)(1), unless the authorization is terminated or revoked.  Performed at [facility], [place] [address] [place], [place] [address]     Time coordinating discharge: 25 minutes  SIGNED: Antonieta Pert, MD  [facility] 03/18/2021, 10:33  AM  If 7PM-7AM, please contact night-coverage [email or web address]

## 2021-03-18 NOTE — Progress Notes (Signed)
IP/ID PROGRESS NOTE  I spoke with patient to explain his exposure to hepatitis B and that we are giving him Hepatitis B series plus IVIG. He is new to receiving hemodialysis. He has not had hepatitis B series before (other than childhood vaccines). He is interested in also getting the covid vaccine primary series. I mentioned to him the plan to follow him in the clinic in 4 wk for addn dose of IVIG and monthly labs of hep B DNA VL x 3.   He is currently getting dialyzed at [facility] should we need to find him.  [facility] [phone]

## 2021-03-19 ENCOUNTER — Telehealth: Payer: Self-pay | Admitting: Internal Medicine

## 2021-03-19 NOTE — Telephone Encounter (Signed)
Historic lab information was emailed to [place] from  HD center; based on results ,he is immune as of 08/15/20

## 2021-03-19 NOTE — Progress Notes (Signed)
Pt discharged to [facility], report called to [employer] and notified of evening meds given to pt.   [facility] here to transport pt at 2330, report given to transport staff. Pt independently packed up all his belongings in 2 bags; RN checked room for all belongings. Pt ambulated to [facility] stretcher and left with [facility] staff.

## 2021-03-23 ENCOUNTER — Other Ambulatory Visit: Payer: Self-pay

## 2021-03-23 ENCOUNTER — Emergency Department (HOSPITAL_COMMUNITY): Payer: Medicaid Other

## 2021-03-23 ENCOUNTER — Emergency Department (HOSPITAL_COMMUNITY)
Admission: EM | Admit: 2021-03-23 | Discharge: 2021-03-23 | Disposition: A | Discharge status: Home / Self Care | Payer: Medicaid Other | Attending: Emergency Medicine | Admitting: Emergency Medicine

## 2021-03-23 ENCOUNTER — Encounter (HOSPITAL_COMMUNITY): Payer: Self-pay | Admitting: Emergency Medicine

## 2021-03-23 DIAGNOSIS — D631 Anemia in chronic kidney disease: Secondary | ICD-10-CM | POA: Diagnosis not present

## 2021-03-23 DIAGNOSIS — N186 End stage renal disease: Secondary | ICD-10-CM | POA: Insufficient documentation

## 2021-03-23 DIAGNOSIS — Z794 Long term (current) use of insulin: Secondary | ICD-10-CM | POA: Insufficient documentation

## 2021-03-23 DIAGNOSIS — I120 Hypertensive chronic kidney disease with stage 5 chronic kidney disease or end stage renal disease: Secondary | ICD-10-CM | POA: Diagnosis not present

## 2021-03-23 DIAGNOSIS — R112 Nausea with vomiting, unspecified: Secondary | ICD-10-CM | POA: Insufficient documentation

## 2021-03-23 DIAGNOSIS — R197 Diarrhea, unspecified: Secondary | ICD-10-CM | POA: Insufficient documentation

## 2021-03-23 DIAGNOSIS — E1140 Type 2 diabetes mellitus with diabetic neuropathy, unspecified: Secondary | ICD-10-CM | POA: Diagnosis not present

## 2021-03-23 DIAGNOSIS — Z28310 Unvaccinated for covid-19: Secondary | ICD-10-CM | POA: Insufficient documentation

## 2021-03-23 DIAGNOSIS — E1122 Type 2 diabetes mellitus with diabetic chronic kidney disease: Secondary | ICD-10-CM | POA: Insufficient documentation

## 2021-03-23 DIAGNOSIS — Z20822 Contact with and (suspected) exposure to covid-19: Secondary | ICD-10-CM | POA: Diagnosis not present

## 2021-03-23 DIAGNOSIS — Z79899 Other long term (current) drug therapy: Secondary | ICD-10-CM | POA: Insufficient documentation

## 2021-03-23 DIAGNOSIS — N189 Chronic kidney disease, unspecified: Secondary | ICD-10-CM

## 2021-03-23 DIAGNOSIS — Z992 Dependence on renal dialysis: Secondary | ICD-10-CM | POA: Diagnosis not present

## 2021-03-23 LAB — I-STAT VENOUS BLOOD GAS, ED
Acid-Base Excess: 7 mmol/L — ABNORMAL HIGH (ref 0.0–2.0)
Bicarbonate: 31.7 mmol/L — ABNORMAL HIGH (ref 20.0–28.0)
Calcium, Ion: 1.07 mmol/L — ABNORMAL LOW (ref 1.15–1.40)
HCT: 29 % — ABNORMAL LOW (ref 39.0–52.0)
Hemoglobin: 9.9 g/dL — ABNORMAL LOW (ref 13.0–17.0)
O2 Saturation: 99 %
Potassium: 4.3 mmol/L (ref 3.5–5.1)
Sodium: 136 mmol/L (ref 135–145)
TCO2: 33 mmol/L — ABNORMAL HIGH (ref 22–32)
pCO2, Ven: 44 mmHg (ref 44.0–60.0)
pH, Ven: 7.466 — ABNORMAL HIGH (ref 7.250–7.430)
pO2, Ven: 142 mmHg — ABNORMAL HIGH (ref 32.0–45.0)

## 2021-03-23 LAB — CBC WITH DIFFERENTIAL/PLATELET
Abs Immature Granulocytes: 0.01 10*3/uL (ref 0.00–0.07)
Basophils Absolute: 0 10*3/uL (ref 0.0–0.1)
Basophils Relative: 0 %
Eosinophils Absolute: 0.1 10*3/uL (ref 0.0–0.5)
Eosinophils Relative: 1 %
HCT: 27.7 % — ABNORMAL LOW (ref 39.0–52.0)
Hemoglobin: 8.7 g/dL — ABNORMAL LOW (ref 13.0–17.0)
Immature Granulocytes: 0 %
Lymphocytes Relative: 10 %
Lymphs Abs: 0.7 10*3/uL (ref 0.7–4.0)
MCH: 26.1 pg (ref 26.0–34.0)
MCHC: 31.4 g/dL (ref 30.0–36.0)
MCV: 83.2 fL (ref 80.0–100.0)
Monocytes Absolute: 0.4 10*3/uL (ref 0.1–1.0)
Monocytes Relative: 7 %
Neutro Abs: 5.3 10*3/uL (ref 1.7–7.7)
Neutrophils Relative %: 82 %
Platelets: 240 10*3/uL (ref 150–400)
RBC: 3.33 MIL/uL — ABNORMAL LOW (ref 4.22–5.81)
RDW: 18 % — ABNORMAL HIGH (ref 11.5–15.5)
WBC: 6.5 10*3/uL (ref 4.0–10.5)
nRBC: 0 % (ref 0.0–0.2)

## 2021-03-23 LAB — POTASSIUM: Potassium: 7.4 mmol/L (ref 3.5–5.1)

## 2021-03-23 LAB — RENAL FUNCTION PANEL
Albumin: 3.7 g/dL (ref 3.5–5.0)
Anion gap: 12 (ref 5–15)
BUN: 64 mg/dL — ABNORMAL HIGH (ref 6–20)
CO2: 24 mmol/L (ref 22–32)
Calcium: 8.8 mg/dL — ABNORMAL LOW (ref 8.9–10.3)
Chloride: 98 mmol/L (ref 98–111)
Creatinine, Ser: 9.1 mg/dL — ABNORMAL HIGH (ref 0.61–1.24)
GFR, Estimated: 7 mL/min — ABNORMAL LOW (ref >60)
Glucose, Bld: 95 mg/dL (ref 70–99)
Phosphorus: 4 mg/dL (ref 2.5–4.6)
Potassium: 4.9 mmol/L (ref 3.5–5.1)
Sodium: 134 mmol/L — ABNORMAL LOW (ref 135–145)

## 2021-03-23 LAB — RESP PANEL BY RT-PCR (FLU A&B, COVID) ARPGX2
Influenza A by PCR: NEGATIVE
Influenza B by PCR: NEGATIVE
SARS Coronavirus 2 by RT PCR: NEGATIVE

## 2021-03-23 LAB — BASIC METABOLIC PANEL
Anion gap: 12 (ref 5–15)
BUN: 80 mg/dL — ABNORMAL HIGH (ref 6–20)
CO2: 23 mmol/L (ref 22–32)
Calcium: 8.7 mg/dL — ABNORMAL LOW (ref 8.9–10.3)
Chloride: 97 mmol/L — ABNORMAL LOW (ref 98–111)
Creatinine, Ser: 11.83 mg/dL — ABNORMAL HIGH (ref 0.61–1.24)
GFR, Estimated: 5 mL/min — ABNORMAL LOW (ref >60)
Glucose, Bld: 179 mg/dL — ABNORMAL HIGH (ref 70–99)
Potassium: 7.4 mmol/L (ref 3.5–5.1)
Sodium: 132 mmol/L — ABNORMAL LOW (ref 135–145)

## 2021-03-23 MED ORDER — SODIUM CHLORIDE 0.9 % IV SOLN: 100.0000 mL | INTRAVENOUS | Status: DC | PRN

## 2021-03-23 MED ORDER — ONDANSETRON HCL 4 MG/2ML IJ SOLN
4.0000 mg | Freq: Once | INTRAMUSCULAR | Status: AC
  Administered 2021-03-23: 4 mg via INTRAVENOUS
  Filled 2021-03-23: qty 2

## 2021-03-23 MED ORDER — HEPARIN SODIUM (PORCINE) 1000 UNIT/ML DIALYSIS
1000.0000 [IU] | INTRAMUSCULAR | Status: DC | PRN
  Filled 2021-03-23: qty 1

## 2021-03-23 MED ORDER — CHLORHEXIDINE GLUCONATE CLOTH 2 % EX PADS: 6.0000 | MEDICATED_PAD | Freq: Every day | CUTANEOUS | Status: DC

## 2021-03-23 MED ORDER — LOPERAMIDE HCL 2 MG PO CAPS
4.0000 mg | ORAL_CAPSULE | Freq: Once | ORAL | Status: AC
  Administered 2021-03-23: 4 mg via ORAL
  Filled 2021-03-23: qty 2

## 2021-03-23 MED ORDER — CALCIUM GLUCONATE-NACL 1-0.675 GM/50ML-% IV SOLN
1.0000 g | Freq: Once | INTRAVENOUS | Status: AC
  Administered 2021-03-23: 1000 mg via INTRAVENOUS
  Filled 2021-03-23: qty 50

## 2021-03-23 MED ORDER — PENTAFLUOROPROP-TETRAFLUOROETH EX AERO
1.0000 "application " | INHALATION_SPRAY | CUTANEOUS | Status: DC | PRN
  Filled 2021-03-23: qty 116

## 2021-03-23 MED ORDER — DEXTROSE 50 % IV SOLN
1.0000 | Freq: Once | INTRAVENOUS | Status: AC
  Administered 2021-03-23: 50 mL via INTRAVENOUS
  Filled 2021-03-23: qty 50

## 2021-03-23 MED ORDER — LIDOCAINE-PRILOCAINE 2.5-2.5 % EX CREA
1.0000 "application " | TOPICAL_CREAM | CUTANEOUS | Status: DC | PRN
  Filled 2021-03-23: qty 5

## 2021-03-23 MED ORDER — CLONIDINE HCL 0.1 MG PO TABS
0.1000 mg | ORAL_TABLET | Freq: Once | ORAL | Status: AC
  Administered 2021-03-23: 0.1 mg via ORAL
  Filled 2021-03-23: qty 1

## 2021-03-23 MED ORDER — INSULIN ASPART 100 UNIT/ML IJ SOLN
5.0000 [IU] | Freq: Once | INTRAMUSCULAR | Status: AC
  Administered 2021-03-23: 5 [IU] via INTRAVENOUS

## 2021-03-23 MED ORDER — LACTATED RINGERS IV BOLUS
500.0000 mL | Freq: Once | INTRAVENOUS | Status: AC
  Administered 2021-03-23: 500 mL via INTRAVENOUS

## 2021-03-23 MED ORDER — ONDANSETRON HCL 4 MG/2ML IJ SOLN
4.0000 mg | INTRAMUSCULAR | Status: AC
  Administered 2021-03-23: 4 mg via INTRAVENOUS
  Filled 2021-03-23: qty 2

## 2021-03-23 MED ORDER — LIDOCAINE HCL (PF) 1 % IJ SOLN: 5.0000 mL | INTRAMUSCULAR | Status: DC | PRN

## 2021-03-23 MED ORDER — ALTEPLASE 2 MG IJ SOLR: 2.0000 mg | Freq: Once | INTRAMUSCULAR | Status: DC | PRN

## 2021-03-23 NOTE — ED Notes (Signed)
[facility] called for transport to [facility]

## 2021-03-23 NOTE — ED Provider Notes (Signed)
[facility] EMERGENCY DEPARTMENT Provider Note   CSN: [id number] Arrival date & time: 03/23/21  0455     History Chief Complaint  Patient presents with   Emesis    [name] is a 35 y.o. male.  The history is provided by the patient.  Emesis He has history of hypertension, diabetes, hyperlipidemia, hypertrophic cardiomyopathy, end-stage renal disease on hemodialysis and comes in because of vomiting and diarrhea which started yesterday.  He has had numerous episodes of vomiting and diarrhea.  He denies fever, chills, sweats.  He denies blood in stool or emesis.  He denies abdominal pain.  He denies any sick contacts.  Of note, he has not been vaccinated against COVID-19.   Past Medical History:  Diagnosis Date   Diabetes mellitus    ESRD on hemodialysis ([place])    High cholesterol    Hypertension    Hypertrophic cardiomyopathy ([place]) 02/26/2021    Patient Active Problem List   Diagnosis Date Noted   Acute respiratory failure with hypoxia ([place]) 03/12/2021   ESRD (end stage renal disease) on dialysis ([place]) 03/12/2021   Hypertrophic cardiomyopathy ([place]) 02/26/2021   Syncope 02/08/2021   DKA (diabetic ketoacidosis) ([place]) 02/08/2021   Seizure-like activity ([place])    Hyperglycemia    Acute hypoxemic respiratory failure ([place]) 11/25/2020   Diabetic foot ulcer ([place]) 11/24/2020   Hypervolemia associated with renal insufficiency 08/07/2020   Uremia 123XX123   Metabolic acidosis 123XX123   Hyperkalemia 08/07/2020   Nausea and vomiting 05/07/2020   Anemia of chronic kidney failure 05/07/2020   Dehydration    Hypertensive urgency 02/25/2020   Acute renal failure superimposed on stage 4 chronic kidney disease ([place]) 12/02/2019   Anemia 12/02/2019   Essential hypertension 12/02/2019   SOB (shortness of breath) 12/02/2019   DM2 (diabetes mellitus, type 2) ([place]) 12/02/2019   Diarrhea 03/23/2017   Early satiety 03/23/2017   Generalized abdominal pain  03/23/2017    Past Surgical History:  Procedure Laterality Date   AV FISTULA PLACEMENT Left 08/11/2020   Procedure: LEFT UPPER EXTREMITY ARTERIOVENOUS (AV) FISTULA CREATION;  Surgeon: Cherre Robins, MD;  Location: [place];  Service: Vascular;  Laterality: Left;   FRACTURE SURGERY     I & D EXTREMITY Left 07/16/2014   Procedure: IRRIGATION AND DEBRIDEMENT EXTREMITY/LEFT INDEX FINGER;  Surgeon: Leanora Cover, MD;  Location: [place];  Service: Orthopedics;  Laterality: Left;   IR PERC TUN PERIT CATH WO PORT S&I /IMAG  08/07/2020   IR US GUIDE VASC ACCESS RIGHT  08/07/2020       Family History  Problem Relation Age of Onset   Cancer Mother    Stroke Father    Diabetes Father    Hypertension Father     Social History   Tobacco Use   Smoking status: Never   Smokeless tobacco: Never  Vaping Use   Vaping Use: Never used  Substance Use Topics   Alcohol use: Not Currently    Comment: social drinker   Drug use: No    Home Medications Prior to Admission medications   Medication Sig Start Date End Date Taking? Authorizing Provider  acetaminophen (TYLENOL) 325 MG tablet Take 2 tablets (650 mg total) by mouth every 4 (four) hours as needed for headache or mild pain. 03/05/21   Samella Parr, NP  atorvastatin (LIPITOR) 40 MG tablet TAKE 1 TABLET (40 MG TOTAL) BY MOUTH DAILY. Patient taking differently: Take 40 mg by mouth at bedtime. 09/06/20 09/06/21  Raul Del,  Vernia Buff, NP  carvedilol (COREG) 25 MG tablet Take 1 tablet (25 mg total) by mouth 2 (two) times daily with a meal. 03/05/21   Samella Parr, NP  doxercalciferol ([name]) 4 MCG/2ML injection Inject 2.5 mLs (5 mcg total) into the vein every Monday, Wednesday, and Friday with hemodialysis. 03/06/21   Samella Parr, NP  epoetin alfa-epbx (RETACRIT) 10932 UNIT/ML injection Inject 20,000 Units into the vein every Monday.    [provider]  imipramine (TOFRANIL) 25 MG tablet Take 1 tablet (25 mg total) by mouth at bedtime.  03/05/21   Samella Parr, NP  insulin glargine-yfgn (SEMGLEE) 100 UNIT/ML injection Inject 0.16 mLs (16 Units total) into the skin at bedtime. 03/05/21   Samella Parr, NP  insulin lispro (HUMALOG) 100 UNIT/ML injection Inject 1-7 Units into the skin See admin instructions. Per sliding scale 3 times daily with meals 151-200= 1 unit 201-250= 2 units 251-300= 3 units 301-350= 5 units 351-400= 7 units Greater than 400 call md    [provider]  losartan (COZAAR) 50 MG tablet Take 1 tablet (50 mg total) by mouth at bedtime. 03/05/21   Samella Parr, NP  multivitamin (RENA-VIT) TABS tablet Take 1 tablet by mouth at bedtime. 11/27/20   Lavina Hamman, MD  pantoprazole (PROTONIX) 40 MG tablet Take 1 tablet (40 mg total) by mouth daily. 03/18/21 04/17/21  Antonieta Pert, MD  sevelamer carbonate (RENVELA) 800 MG tablet Take 2 tablets (1,600 mg total) by mouth 3 (three) times daily with meals. 03/05/21   Samella Parr, NP    Allergies    Amlodipine and Hydralazine  Review of Systems   Review of Systems  Gastrointestinal:  Positive for vomiting.  All other systems reviewed and are negative.  Physical Exam Updated Vital Signs BP (!) 161/101 (BP Location: Right Arm)   Pulse 68   Temp 97.7 F (36.5 C) (Oral)   Resp (!) 21   Ht 6' (1.829 m)   Wt 92.4 kg   SpO2 96%   BMI 27.63 kg/m   Physical Exam Vitals and nursing note reviewed.  35 year old male, resting comfortably and in no acute distress. Vital signs are significant for elevated blood pressure. Oxygen saturation is 96%, which is normal. Head is normocephalic and atraumatic. PERRLA, EOMI. Oropharynx is clear. Neck is nontender and supple without adenopathy or JVD. Back is nontender and there is no CVA tenderness. Lungs are clear without rales, wheezes, or rhonchi. Chest is nontender. Heart has regular rate and rhythm without murmur. Abdomen is soft, flat, nontender without masses or hepatosplenomegaly and peristalsis is  hypoactive. Extremities have no cyanosis or edema, full range of motion is present.  AV fistula present left forearm with thrill present. Skin is warm and dry without rash. Neurologic: Mental status is normal, cranial nerves are intact, moves all extremities equally.  ED Results / Procedures / Treatments   Labs (all labs ordered are listed, but only abnormal results are displayed) Labs Reviewed  CBC WITH DIFFERENTIAL/PLATELET - Abnormal; Notable for the following components:      Result Value   RBC 3.33 (*)    Hemoglobin 8.7 (*)    HCT 27.7 (*)    RDW 18.0 (*)    All other components within normal limits  RESP PANEL BY RT-PCR (FLU A&B, COVID) ARPGX2  BASIC METABOLIC PANEL   Procedures Procedures   Medications Ordered in ED Medications  lactated ringers bolus 500 mL (has no administration in time  range)  ondansetron (ZOFRAN) injection 4 mg (has no administration in time range)  loperamide (IMODIUM) capsule 4 mg (has no administration in time range)   ED Course  I have reviewed the triage vital signs and the nursing notes.  Pertinent labs & imaging results that were available during my care of the patient were reviewed by me and considered in my medical decision making (see chart for details).    MDM Rules/Calculators/A&P                         Nausea, vomiting, diarrhea and pattern most suggestive of viral gastroenteritis.  No red flags to suggest more serious pathology such as bowel obstruction.  He will be given IV fluids, ondansetron and oral loperamide.  We will check screening labs.  Old records are reviewed, and he has no relevant past visits.  CBC shows stable anemia.  Metabolic panel is pending.  He feels much better following above-noted treatment.  Case is signed out to Dr. Sabra Heck.  Final Clinical Impression(s) / ED Diagnoses Final diagnoses:  Nausea vomiting and diarrhea  End-stage renal disease on hemodialysis ([place])  Anemia associated with chronic renal failure     Rx / DC Orders ED Discharge Orders     None        Delora Fuel, MD [id number] [phone]

## 2021-03-23 NOTE — ED Notes (Signed)
Drinking coffee

## 2021-03-23 NOTE — ED Notes (Signed)
When going to assist pt for d/c Pt o2 saturations were in the 70's and unable to get any higher then 83%, pt states that he sometimes has low o2 but does not require O2 placed on 2L and saturations rapidly went to 95% PA is aware

## 2021-03-23 NOTE — Discharge Instructions (Addendum)
Get help right away if: You have pain in your chest, neck, arm, or jaw. You feel extremely weak or you faint. You have persistent vomiting. You have vomit that is bright red or looks like black coffee grounds. You have stools that are bloody or black, or stools that look like tar. You have a severe headache, a stiff neck, or both. You have severe pain, cramping, or bloating in your abdomen. You have trouble breathing or you are breathing very quickly. Your heart is beating very quickly. Your skin feels cold and clammy. You feel confused. You have signs of dehydration, such as: Dark urine, very little urine, or no urine. Cracked lips. Dry mouth. Sunken eyes. Sleepiness. Weakness.

## 2021-03-23 NOTE — ED Provider Notes (Cosign Needed Addendum)
Patient seen earlier today with nausea and vomiting.  He has gone to dialysis.  He returns to the emergency department states that he has no nausea at this time is feeling much better.  Patient will be discharged back to his skilled nursing facility.  Patient VBG shows elevated venous oxygen. 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 I believe the patient is safe for discharge.    Margarita Mail, PA-C 03/23/21 [place], PA-C 03/23/21 1512

## 2021-03-23 NOTE — Procedures (Signed)
Seen and examined on dialysis.  Blood pressure 179/108 and HR 64; LUE avf in use.  Tolerating goal and he requests to increase.  Spoke with nursing.  0k bath for 20 minutes then transition to 2K bath and page me if K is over 5.5.  full consult if admitted   Claudia Desanctis, MD 03/23/2021  9:34 AM

## 2021-03-23 NOTE — ED Provider Notes (Signed)
Change of shift, care signed out from Dr. Roxanne Mins, this is a 35 year old patient presenting from his nursing facility where he currently resides, he has end-stage renal disease on dialysis access at the left distal forearm, on exam the patient is slightly hypertensive at 170/113, he has a good thrill in the left fistula, his heart exam is unremarkable with a normal pulse and no murmurs, he is not in any respiratory distress.  He is actively nauseated and dry heaving.  He has no lower extremity edema.  I discussed the care with Dr. Royce Macadamia of the nephrology service who will arrange for dialysis, requests 5 units of insulin and 1 amp of D50.  EKG performed at 7:23 AM shows sinus rhythm rate of 67 bpm, rightward axis, poor R wave progression, QRS of 111 ms   EKG Interpretation  Date/Time:    Ventricular Rate:    PR Interval:    QRS Duration:   QT Interval:    QTC Calculation:   R Axis:     Text Interpretation:            James Chapel, MD 03/27/21 [phone]

## 2021-03-23 NOTE — ED Triage Notes (Signed)
[facility] - pt from [place] with c/o N/V/D x 3 days with no relief from antiemetics. Pt is a dialysis M/W/F

## 2021-04-08 ENCOUNTER — Ambulatory Visit: Payer: Medicaid Other | Admitting: Physician Assistant

## 2021-04-08 NOTE — Progress Notes (Deleted)
Cardiology Office Note:    Date:  04/08/2021   ID:  [name], DOB 10-Dec-1985, MRN [id number]  PCP:  Gildardo Pounds, NP  [name] Cardiologist:  Candee Furbish, MD  [name]:  None   Chief Complaint: hospital follow up   History of Present Illness:    [name] is a 35 y.o. male with a hx of diabetes, hypertension, hyperlipidemia, end-stage renal disease on hemodialysis Monday Wednesday Friday and chronic anemia presents for follow-up.  Admitted August 2022.  Echocardiogram showed mildly reduced LV function at 45 to 50% and severe LVH consistent with hypertrophic cardiomyopathy. Continued carvedilol 25 mg twice daily.  Not a candidate for Entresto given end-stage renal disease.  Patient was on losartan 50 mg.  Per Dr. Marlou Porch note "Personally reviewed his echocardiogram, the wall texture thickness as well as RV thickness has a pattern compatible with amyloidosis however his age seems a bit odd for this.  We could always check a PYP scan in the future. As outpatient would place a Zio patch monitor for 2 weeks looking for any adverse arrhythmias"   Admitted September 2022 for acute respiratory failure with hypoxia secondary to missed dialysis.  Symptoms improved with dialysis.    Past Medical History:  Diagnosis Date   Diabetes mellitus    ESRD on hemodialysis ([name])    High cholesterol    Hypertension    Hypertrophic cardiomyopathy ([name]) 02/26/2021    Past Surgical History:  Procedure Laterality Date   AV FISTULA PLACEMENT Left 08/11/2020   Procedure: LEFT UPPER EXTREMITY ARTERIOVENOUS (AV) FISTULA CREATION;  Surgeon: Cherre Robins, MD;  Location: [name];  Service: Vascular;  Laterality: Left;   FRACTURE SURGERY     I & D EXTREMITY Left 07/16/2014   Procedure: IRRIGATION AND DEBRIDEMENT EXTREMITY/LEFT INDEX FINGER;  Surgeon: Leanora Cover, MD;  Location: [name];  Service: Orthopedics;  Laterality: Left;   IR PERC TUN PERIT CATH WO PORT S&I  /IMAG  08/07/2020   IR US GUIDE VASC ACCESS RIGHT  08/07/2020    Current Medications: No outpatient medications have been marked as taking for the 04/08/21 encounter (Appointment) with Leanor Kail, PA.     Allergies:   Amlodipine and Hydralazine   Social History   Socioeconomic History   Marital status: Single    Spouse name: Not on file   Number of children: Not on file   Years of education: Not on file   Highest education level: Not on file  Occupational History   Occupation: disabled  Tobacco Use   Smoking status: Never   Smokeless tobacco: Never  Vaping Use   Vaping Use: Never used  Substance and Sexual Activity   Alcohol use: Not Currently    Comment: social drinker   Drug use: No   Sexual activity: Yes  Other Topics Concern   Not on file  Social History Narrative   Not on file   Social Determinants of Health   Financial Resource Strain: Not on file  Food Insecurity: Not on file  Transportation Needs: Not on file  Physical Activity: Not on file  Stress: Not on file  Social Connections: Not on file     Family History: The patient's family history includes Cancer in his mother; Diabetes in his father; Hypertension in his father; Stroke in his father.  ***  ROS:   Please see the history of present illness.    All other systems reviewed and are negative. ***  EKGs/Labs/Other Studies  Reviewed:    The following studies were reviewed today: ***  EKG:  EKG is *** ordered today.  The ekg ordered today demonstrates ***  Recent Labs: 11/25/2020: TSH 1.364 02/13/2021: Magnesium 2.1 03/11/2021: ALT 61 03/12/2021: B Natriuretic Peptide 4,361.1 03/23/2021: BUN 64; Creatinine, Ser 9.10; Hemoglobin 9.9; Platelets 240; Potassium 4.3; Sodium 136  Recent Lipid Panel    Component Value Date/Time   CHOL 146 09/16/2020 1356   TRIG 82 09/16/2020 1356   HDL 56 09/16/2020 1356   CHOLHDL 2.6 09/16/2020 1356   CHOLHDL 6.4 12/02/2019 1311   VLDL 22 12/02/2019 1311    LDLCALC 74 09/16/2020 1356     Risk Assessment/Calculations:   {Does this patient have ATRIAL FIBRILLATION?[phone]}   Physical Exam:    VS:  There were no vitals taken for this visit.    Wt Readings from Last 3 Encounters:  03/23/21 203 lb 11.3 oz (92.4 kg)  03/18/21 203 lb 11.3 oz (92.4 kg)  03/04/21 207 lb 3.7 oz (94 kg)     GEN: *** Well nourished, well developed in no acute distress HEENT: Normal NECK: No JVD; No carotid bruits LYMPHATICS: No lymphadenopathy CARDIAC: ***RRR, no murmurs, rubs, gallops RESPIRATORY:  Clear to auscultation without rales, wheezing or rhonchi  ABDOMEN: Soft, non-tender, non-distended MUSCULOSKELETAL:  No edema; No deformity  SKIN: Warm and dry NEUROLOGIC:  Alert and oriented x 3 PSYCHIATRIC:  Normal affect   ASSESSMENT AND PLAN:    ***  2. ***  Medication Adjustments/Labs and Tests Ordered: Current medicines are reviewed at length with the patient today.  Concerns regarding medicines are outlined above.  No orders of the defined types were placed in this encounter.  No orders of the defined types were placed in this encounter.   There are no Patient Instructions on file for this visit.   Jarrett Soho, [place]  04/08/2021 9:24 AM    [name]  Cardiology Office Note:    Date:  04/08/2021   ID:  [name], DOB March 23, 1986, MRN [id number]  PCP:  Gildardo Pounds, NP  [facility] Cardiologist:  Candee Furbish, MD  [facility]:  None   Chief Complaint:   History of Present Illness:    [name] is a 35 y.o. male with a hx of ***  Past Medical History:  Diagnosis Date   Diabetes mellitus    ESRD on hemodialysis ([name])    High cholesterol    Hypertension    Hypertrophic cardiomyopathy ([name]) 02/26/2021    Past Surgical History:  Procedure Laterality Date   AV FISTULA PLACEMENT Left 08/11/2020   Procedure: LEFT UPPER EXTREMITY ARTERIOVENOUS (AV)  FISTULA CREATION;  Surgeon: Cherre Robins, MD;  Location: [place];  Service: Vascular;  Laterality: Left;   FRACTURE SURGERY     I & D EXTREMITY Left 07/16/2014   Procedure: IRRIGATION AND DEBRIDEMENT EXTREMITY/LEFT INDEX FINGER;  Surgeon: Leanora Cover, MD;  Location: [place];  Service: Orthopedics;  Laterality: Left;   IR PERC TUN PERIT CATH WO PORT S&I /IMAG  08/07/2020   IR US GUIDE VASC ACCESS RIGHT  08/07/2020    Current Medications: No outpatient medications have been marked as taking for the 04/08/21 encounter (Appointment) with Leanor Kail, PA.     Allergies:   Amlodipine and Hydralazine   Social History   Socioeconomic History   Marital status: Single    Spouse name: Not on file   Number of children: Not on file  Years of education: Not on file   Highest education level: Not on file  Occupational History   Occupation: disabled  Tobacco Use   Smoking status: Never   Smokeless tobacco: Never  Vaping Use   Vaping Use: Never used  Substance and Sexual Activity   Alcohol use: Not Currently    Comment: social drinker   Drug use: No   Sexual activity: Yes  Other Topics Concern   Not on file  Social History Narrative   Not on file   Social Determinants of Health   Financial Resource Strain: Not on file  Food Insecurity: Not on file  Transportation Needs: Not on file  Physical Activity: Not on file  Stress: Not on file  Social Connections: Not on file     Family History: The patient's family history includes Cancer in his mother; Diabetes in his father; Hypertension in his father; Stroke in his father.  ***  ROS:   Please see the history of present illness.    All other systems reviewed and are negative. ***  EKGs/Labs/Other Studies Reviewed:    The following studies were reviewed today: ***  EKG:  EKG is *** ordered today.  The ekg ordered today demonstrates ***  Recent Labs: 11/25/2020: TSH 1.364 02/13/2021: Magnesium 2.1 03/11/2021: ALT 61 03/12/2021:  B Natriuretic Peptide 4,361.1 03/23/2021: BUN 64; Creatinine, Ser 9.10; Hemoglobin 9.9; Platelets 240; Potassium 4.3; Sodium 136  Recent Lipid Panel    Component Value Date/Time   CHOL 146 09/16/2020 1356   TRIG 82 09/16/2020 1356   HDL 56 09/16/2020 1356   CHOLHDL 2.6 09/16/2020 1356   CHOLHDL 6.4 12/02/2019 1311   VLDL 22 12/02/2019 1311   LDLCALC 74 09/16/2020 1356     Risk Assessment/Calculations:   {Does this patient have ATRIAL FIBRILLATION?:[phone]}   Physical Exam:    VS:  There were no vitals taken for this visit.    Wt Readings from Last 3 Encounters:  03/23/21 203 lb 11.3 oz (92.4 kg)  03/18/21 203 lb 11.3 oz (92.4 kg)  03/04/21 207 lb 3.7 oz (94 kg)     GEN: *** Well nourished, well developed in no acute distress HEENT: Normal NECK: No JVD; No carotid bruits LYMPHATICS: No lymphadenopathy CARDIAC: ***RRR, no murmurs, rubs, gallops RESPIRATORY:  Clear to auscultation without rales, wheezing or rhonchi  ABDOMEN: Soft, non-tender, non-distended MUSCULOSKELETAL:  No edema; No deformity  SKIN: Warm and dry NEUROLOGIC:  Alert and oriented x 3 PSYCHIATRIC:  Normal affect   ASSESSMENT AND PLAN:    ***  2. ***  Medication Adjustments/Labs and Tests Ordered: Current medicines are reviewed at length with the patient today.  Concerns regarding medicines are outlined above.  No orders of the defined types were placed in this encounter.  No orders of the defined types were placed in this encounter.   There are no Patient Instructions on file for this visit.   Jarrett Soho, [place]  04/08/2021 9:24 AM    [facility]

## 2021-04-16 ENCOUNTER — Other Ambulatory Visit: Payer: Self-pay

## 2021-04-16 ENCOUNTER — Encounter: Payer: Self-pay | Admitting: Internal Medicine

## 2021-04-16 ENCOUNTER — Ambulatory Visit (INDEPENDENT_AMBULATORY_CARE_PROVIDER_SITE_OTHER): Payer: Medicaid Other | Admitting: Internal Medicine

## 2021-04-16 ENCOUNTER — Telehealth: Payer: Self-pay

## 2021-04-16 VITALS — BP 159/96 | HR 68 | Temp 98.8°F | Ht 72.0 in | Wt 203.0 lb

## 2021-04-16 DIAGNOSIS — Z205 Contact with and (suspected) exposure to viral hepatitis: Secondary | ICD-10-CM

## 2021-04-16 DIAGNOSIS — N186 End stage renal disease: Secondary | ICD-10-CM | POA: Diagnosis not present

## 2021-04-16 DIAGNOSIS — Z992 Dependence on renal dialysis: Secondary | ICD-10-CM | POA: Diagnosis not present

## 2021-04-16 NOTE — Telephone Encounter (Signed)
Called patient to see if he would be able to make it to this morning's appointment, no answer on one line and no secure voicemail. Other line call could not be completed.   Beryle Flock, RN

## 2021-04-16 NOTE — Progress Notes (Signed)
RFV: hospital follow-up, from hep B exposure  Patient ID: [name], male   DOB: 07/02/1986, 35 y.o.   MRN: [id number]  HPI  Had covid-19 in  oct 2020 and also asx July 2022, in setting of seizure - but felt his breathing was irregular since having covid 19 in 2020, he considers himself as having long covid. still fatigue, and high blood pressure, and blood sugar irregularities. While hospitalized, he was unintentionally exposed to hemodialysis machine that was used in chronic hep B patient. After determining that [name] was exposed to that same machine, he was given immunoglobulins nad a dose of hep B vaccine since it was unclear of his vaccine status. He has been doing okay since then.    Worked for [employer] up until feb 2022.   Now thinking his ESRD is due to HTN for loss of vision, pulmonary issues, and kidney  Not missing HD M-W-F [facility]. Staying [place] ( maybe where he is staying...)    Outpatient Encounter Medications as of 04/16/2021  Medication Sig   acetaminophen (TYLENOL) 325 MG tablet Take 2 tablets (650 mg total) by mouth every 4 (four) hours as needed for headache or mild pain.   atorvastatin (LIPITOR) 40 MG tablet TAKE 1 TABLET (40 MG TOTAL) BY MOUTH DAILY. (Patient taking differently: Take 40 mg by mouth at bedtime.)   carvedilol (COREG) 25 MG tablet Take 1 tablet (25 mg total) by mouth 2 (two) times daily with a meal.   doxercalciferol ([name]) 4 MCG/2ML injection Inject 2.5 mLs (5 mcg total) into the vein every Monday, Wednesday, and Friday with hemodialysis.   epoetin alfa-epbx (RETACRIT) 91478 UNIT/ML injection Inject 20,000 Units into the vein every Monday.   insulin glargine-yfgn (SEMGLEE) 100 UNIT/ML injection Inject 0.16 mLs (16 Units total) into the skin at bedtime.   insulin lispro (HUMALOG) 100 UNIT/ML injection Inject 1-7 Units into the skin See admin instructions. Per sliding scale 3 times daily with meals 151-200= 1  unit 201-250= 2 units 251-300= 3 units 301-350= 5 units 351-400= 7 units Greater than 400 call md   losartan (COZAAR) 50 MG tablet Take 1 tablet (50 mg total) by mouth at bedtime.   multivitamin (RENA-VIT) TABS tablet Take 1 tablet by mouth at bedtime.   pantoprazole (PROTONIX) 40 MG tablet Take 1 tablet (40 mg total) by mouth daily.   sevelamer carbonate (RENVELA) 800 MG tablet Take 2 tablets (1,600 mg total) by mouth 3 (three) times daily with meals.   imipramine (TOFRANIL) 25 MG tablet Take 1 tablet (25 mg total) by mouth at bedtime. (Patient not taking: Reported on 04/16/2021)   Facility-Administered Encounter Medications as of 04/16/2021  Medication   COVID-19 mRNA Vac-[employer]) injection 0.3 mL     Patient Active Problem List   Diagnosis Date Noted   Acute respiratory failure with hypoxia ([place]) 03/12/2021   ESRD (end stage renal disease) on dialysis ([place]) 03/12/2021   Hypertrophic cardiomyopathy ([place]) 02/26/2021   Syncope 02/08/2021   DKA (diabetic ketoacidosis) ([place]) 02/08/2021   Seizure-like activity ([place])    Hyperglycemia    Acute hypoxemic respiratory failure ([place]) 11/25/2020   Diabetic foot ulcer ([place]) 11/24/2020   Hypervolemia associated with renal insufficiency 08/07/2020   Uremia 123XX123   Metabolic acidosis 123XX123   Hyperkalemia 08/07/2020   Nausea and vomiting 05/07/2020   Anemia of chronic kidney failure 05/07/2020   Dehydration    Hypertensive urgency 02/25/2020   Acute renal failure superimposed on stage 4  chronic kidney disease ([place]) 12/02/2019   Anemia 12/02/2019   Essential hypertension 12/02/2019   SOB (shortness of breath) 12/02/2019   DM2 (diabetes mellitus, type 2) ([place]) 12/02/2019   Diarrhea 03/23/2017   Early satiety 03/23/2017   Generalized abdominal pain 03/23/2017     Health Maintenance Due  Topic Date Due   COVID-19 Vaccine (1) Never done   FOOT EXAM  Never done   OPHTHALMOLOGY EXAM  Never done   TETANUS/TDAP  Never done    INFLUENZA VACCINE  Never done     Review of Systems Review of Systems  Constitutional: Negative for fever, chills, diaphoresis, activity change, appetite change, fatigue and unexpected weight change.  HENT: Negative for congestion, sore throat, rhinorrhea, sneezing, trouble swallowing and sinus pressure.  Eyes: Negative for photophobia and visual disturbance.  Respiratory: Negative for cough, chest tightness, shortness of breath, wheezing and stridor.  Cardiovascular: Negative for chest pain, palpitations and leg swelling.  Gastrointestinal: Negative for nausea, vomiting, abdominal pain, diarrhea, constipation, blood in stool, abdominal distention and anal bleeding.  Genitourinary: Negative for dysuria, hematuria, flank pain and difficulty urinating.  Musculoskeletal: Negative for myalgias, back pain, joint swelling, arthralgias and gait problem.  Skin: Negative for color change, pallor, rash and wound.  Neurological: Negative for dizziness, tremors, weakness and light-headedness.  Hematological: Negative for adenopathy. Does not bruise/bleed easily.  Psychiatric/Behavioral: Negative for behavioral problems, confusion, sleep disturbance, dysphoric mood, decreased concentration and agitation.   Physical Exam   BP (!) 159/96   Pulse 68   Temp 98.8 F (37.1 C) (Rectal)   Ht 6' (1.829 m)   Wt 203 lb (92.1 kg)   SpO2 99%   BMI 27.53 kg/m    Physical Exam  Constitutional: He is oriented to person, place, and time. He appears well-developed and well-nourished. No distress.  HENT:  Mouth/Throat: Oropharynx is clear and moist. No oropharyngeal exudate.  Cardiovascular: Normal rate, regular rhythm and normal heart sounds. Exam reveals no gallop and no friction rub.  No murmur heard.  Pulmonary/Chest: Effort normal and breath sounds normal. No respiratory distress. He has no wheezes.  Abdominal: Soft. Bowel sounds are normal. He exhibits no distension. There is no tenderness.   Lymphadenopathy:  He has no cervical adenopathy.  Neurological: He is alert and oriented to person, place, and time.  Skin: Skin is warm and dry. No rash noted. No erythema.  Psychiatric: He has a normal mood and affect. His behavior is normal.   CBC Lab Results  Component Value Date   WBC 6.5 03/23/2021   RBC 3.33 (L) 03/23/2021   HGB 9.9 (L) 03/23/2021   HCT 29.0 (L) 03/23/2021   PLT 240 03/23/2021   MCV 83.2 03/23/2021   MCH 26.1 03/23/2021   MCHC 31.4 03/23/2021   RDW 18.0 (H) 03/23/2021   LYMPHSABS 0.7 03/23/2021   MONOABS 0.4 03/23/2021   EOSABS 0.1 03/23/2021    BMET Lab Results  Component Value Date   NA 136 03/23/2021   K 4.3 03/23/2021   CL 98 03/23/2021   CO2 24 03/23/2021   GLUCOSE 95 03/23/2021   BUN 64 (H) 03/23/2021   CREATININE 9.10 (H) 03/23/2021   CALCIUM 8.8 (L) 03/23/2021   GFRNONAA 7 (L) 03/23/2021   GFRAA 19 (L) 03/10/2020      Assessment and Plan Will check hep B surface ab,ag, and viral load to ensure still hep b immune. Will plan to see back in 4 wk.

## 2021-04-19 LAB — HEPATITIS B SURFACE ANTIGEN: Hepatitis B Surface Ag: NONREACTIVE

## 2021-04-19 LAB — HEPATITIS B DNA, ULTRAQUANTITATIVE, PCR
Hepatitis B DNA (Calc): <1 Log IU/mL
Hepatitis B DNA: <10 IU/mL

## 2021-04-19 LAB — HEPATITIS B SURFACE ANTIBODY,QUALITATIVE: Hep B S Ab: REACTIVE — AB

## 2021-05-20 ENCOUNTER — Encounter: Payer: Self-pay | Admitting: Internal Medicine

## 2021-05-20 ENCOUNTER — Ambulatory Visit (INDEPENDENT_AMBULATORY_CARE_PROVIDER_SITE_OTHER): Payer: Medicaid Other | Admitting: Internal Medicine

## 2021-05-20 ENCOUNTER — Other Ambulatory Visit: Payer: Self-pay

## 2021-05-20 VITALS — Wt 205.0 lb

## 2021-05-20 DIAGNOSIS — Z205 Contact with and (suspected) exposure to viral hepatitis: Secondary | ICD-10-CM | POA: Diagnosis present

## 2021-05-20 NOTE — Progress Notes (Signed)
RFV: follow up after hep B exposure through HD in HBV immune patient  Patient ID: [name], male   DOB: [birth year], 35 y.o.   MRN: [id number]  HPI [name] is a 35yo M with ESRD on HD, HTN, HLD, anemia of chronic disease. He is one of the 2 patients identified to have been HD on machine that was used on active hep B patient. The patient is a resident of [employer] [facility]. When exposure was identified, his immune status was unknown, he received hep B vaccine plus IVIG. Records from HD thereafter revealed that he was immune. He did follow up last month that should he is still immune and hep B VL was negative. Today, he returns to as follow up.   Outpatient Encounter Medications as of 05/20/2021  Medication Sig   acetaminophen (TYLENOL) 325 MG tablet Take 2 tablets (650 mg total) by mouth every 4 (four) hours as needed for headache or mild pain.   atorvastatin (LIPITOR) 40 MG tablet TAKE 1 TABLET (40 MG TOTAL) BY MOUTH DAILY. (Patient taking differently: Take 40 mg by mouth at bedtime.)   carvedilol (COREG) 25 MG tablet Take 1 tablet (25 mg total) by mouth 2 (two) times daily with a meal.   doxercalciferol ([name]) 4 MCG/2ML injection Inject 2.5 mLs (5 mcg total) into the vein every Monday, Wednesday, and Friday with hemodialysis.   epoetin alfa-epbx (RETACRIT) 95284 UNIT/ML injection Inject 20,000 Units into the vein every Monday.   imipramine (TOFRANIL) 25 MG tablet Take 1 tablet (25 mg total) by mouth at bedtime. (Patient not taking: Reported on 04/16/2021)   insulin glargine-yfgn (SEMGLEE) 100 UNIT/ML injection Inject 0.16 mLs (16 Units total) into the skin at bedtime.   insulin lispro (HUMALOG) 100 UNIT/ML injection Inject 1-7 Units into the skin See admin instructions. Per sliding scale 3 times daily with meals 151-200= 1 unit 201-250= 2 units 251-300= 3 units 301-350= 5 units 351-400= 7 units Greater than 400 call md   losartan (COZAAR) 50 MG tablet Take 1 tablet (50 mg total) by  mouth at bedtime.   multivitamin (RENA-VIT) TABS tablet Take 1 tablet by mouth at bedtime.   pantoprazole (PROTONIX) 40 MG tablet Take 1 tablet (40 mg total) by mouth daily.   sevelamer carbonate (RENVELA) 800 MG tablet Take 2 tablets (1,600 mg total) by mouth 3 (three) times daily with meals.   Facility-Administered Encounter Medications as of 05/20/2021  Medication   COVID-19 mRNA Vac-[employer]) injection 0.3 mL     Patient Active Problem List   Diagnosis Date Noted   Acute respiratory failure with hypoxia ([place]) 03/12/2021   ESRD (end stage renal disease) on dialysis ([place]) 03/12/2021   Hypertrophic cardiomyopathy ([place]) 02/26/2021   Syncope 02/08/2021   DKA (diabetic ketoacidosis) ([place]) 02/08/2021   Seizure-like activity ([place])    Hyperglycemia    Acute hypoxemic respiratory failure ([place]) 11/25/2020   Diabetic foot ulcer ([place]) 11/24/2020   Hypervolemia associated with renal insufficiency 08/07/2020   Uremia 13/24/4010   Metabolic acidosis 27/25/3664   Hyperkalemia 08/07/2020   Nausea and vomiting 05/07/2020   Anemia of chronic kidney failure 05/07/2020   Dehydration    Hypertensive urgency 02/25/2020   Acute renal failure superimposed on stage 4 chronic kidney disease ([place]) 12/02/2019   Anemia 12/02/2019   Essential hypertension 12/02/2019   SOB (shortness of breath) 12/02/2019   DM2 (diabetes mellitus, type 2) ([place]) 12/02/2019   Diarrhea 03/23/2017   Early satiety 03/23/2017   Generalized abdominal pain 03/23/2017  Health Maintenance Due  Topic Date Due   COVID-19 Vaccine (1) Never done   Pneumococcal Vaccine 75-5 Years old (1 - PCV) Never done   FOOT EXAM  Never done   OPHTHALMOLOGY EXAM  Never done   TETANUS/TDAP  Never done   INFLUENZA VACCINE  Never done     Review of Systems 12 point ros is negative per hpi Physical Exam   Wt 205 lb (93 kg)   BMI 27.80 kg/m   Physical Exam  Constitutional: He is oriented to person, place, and time. He appears  well-developed and well-nourished. No distress.  HENT:  Mouth/Throat: Oropharynx is clear and moist. No oropharyngeal exudate.  Cardiovascular: Normal rate, regular rhythm and normal heart sounds. Exam reveals no gallop and no friction rub.  No murmur heard.  Pulmonary/Chest: Effort normal and breath sounds normal. No respiratory distress. He has no wheezes.  Abdominal: Soft. Bowel sounds are normal. He exhibits no distension. There is no tenderness.  Lymphadenopathy:  He has no cervical adenopathy.  Neurological: He is alert and oriented to person, place, and time.  Skin: Skin is warm and dry. No rash noted. No erythema.  Psychiatric: He has a normal mood and affect. His behavior is normal.   Lab Results  Component Value Date   HEPBSAB REACTIVE (A) 04/16/2021   No results found for: RPR, LABRPR  CBC Lab Results  Component Value Date   WBC 6.5 03/23/2021   RBC 3.33 (L) 03/23/2021   HGB 9.9 (L) 03/23/2021   HCT 29.0 (L) 03/23/2021   PLT 240 03/23/2021   MCV 83.2 03/23/2021   MCH 26.1 03/23/2021   MCHC 31.4 03/23/2021   RDW 18.0 (H) 03/23/2021   LYMPHSABS 0.7 03/23/2021   MONOABS 0.4 03/23/2021   EOSABS 0.1 03/23/2021    BMET Lab Results  Component Value Date   NA 136 03/23/2021   K 4.3 03/23/2021   CL 98 03/23/2021   CO2 24 03/23/2021   GLUCOSE 95 03/23/2021   BUN 64 (H) 03/23/2021   CREATININE 9.10 (H) 03/23/2021   CALCIUM 8.8 (L) 03/23/2021   GFRNONAA 7 (L) 03/23/2021   GFRAA 19 (L) 03/10/2020    Assessment and Plan  History of hep B exposure from HD in early September = the patient remains immune to hepatitis B. Will check hep b VL.it has been alittel over 2 months out since his exposure. Unlikely to develop active hep B since he has immunity (but also did receive immunoglobulin at the time of exposure and dose of vaccine since his immune status was unknown at the time of exposure)  ESRD on HD= continue to follow up with dr [name] and his hemodialysis  center.

## 2021-05-23 LAB — HEPATITIS B DNA, ULTRAQUANTITATIVE, PCR
Hepatitis B DNA (Calc): <1 Log IU/mL
Hepatitis B DNA: <10 IU/mL

## 2021-06-01 NOTE — Progress Notes (Signed)
Cardiology Office Note:    Date:  06/02/2021   ID:  [name], DOB [birth year], MRN [id number]  PCP:  Gildardo Pounds, NP [facility] Cardiologist: Candee Furbish, MD   Reason for visit: Hospital follow-up  History of Present Illness:    [name] is a 35 y.o. male with a hx of hypertrophic cardiomyopathy, ESRD on HD MWF, HTN, HLD, type 2 diabetes, diabetic foot ulcer, legally blind resident of [place].    He was last seen by Dr. Marlou Porch in August 2022 during a hospitalization for acute metabolic encephalopathy thought secondary to significant hyperglycemia.  Dr. Marlou Porch recommended continuing Coreg 25 mg twice a day for hypertrophic cardiopathy.  No significant ventricular arrhythmias during the hospitalization.  Today, the patient is doing better.  He denies syncope.  He has had 2 episodes of lightheadedness following dialysis.  He denies chest pain.  He has some shortness of breath with overexertion such as going uphill or having too high of resistance on the exercise bike.  He denies leg edema, PND and orthopnea.  He has palpitations occasionally, occurring most recently once per month.  Palpitations were worse during summer when he had electrolyte abnormalities (he was not taking his dialysis supplements regularly at that time).       Past Medical History:  Diagnosis Date   Diabetes mellitus    ESRD on hemodialysis ([place])    High cholesterol    Hypertension    Hypertrophic cardiomyopathy ([place]) 02/26/2021    Past Surgical History:  Procedure Laterality Date   AV FISTULA PLACEMENT Left 08/11/2020   Procedure: LEFT UPPER EXTREMITY ARTERIOVENOUS (AV) FISTULA CREATION;  Surgeon: Cherre Robins, MD;  Location: [facility];  Service: Vascular;  Laterality: Left;   FRACTURE SURGERY     I & D EXTREMITY Left 07/16/2014   Procedure: IRRIGATION AND DEBRIDEMENT EXTREMITY/LEFT INDEX FINGER;  Surgeon: Leanora Cover, MD;  Location: [facility];  Service: Orthopedics;  Laterality:  Left;   IR PERC TUN PERIT CATH WO PORT S&I /IMAG  08/07/2020   IR US GUIDE VASC ACCESS RIGHT  08/07/2020    Current Medications: Current Meds  Medication Sig   acetaminophen (TYLENOL) 325 MG tablet Take 2 tablets (650 mg total) by mouth every 4 (four) hours as needed for headache or mild pain.   atorvastatin (LIPITOR) 40 MG tablet TAKE 1 TABLET (40 MG TOTAL) BY MOUTH DAILY. (Patient taking differently: Take 40 mg by mouth at bedtime.)   carvedilol (COREG) 25 MG tablet Take 1 tablet (25 mg total) by mouth 2 (two) times daily with a meal.   doxercalciferol ([name]) 4 MCG/2ML injection Inject 2.5 mLs (5 mcg total) into the vein every Monday, Wednesday, and Friday with hemodialysis.   epoetin alfa-epbx (RETACRIT) 40981 UNIT/ML injection Inject 20,000 Units into the vein every Monday.   imipramine (TOFRANIL) 25 MG tablet Take 1 tablet (25 mg total) by mouth at bedtime.   insulin glargine-yfgn (SEMGLEE) 100 UNIT/ML injection Inject 0.16 mLs (16 Units total) into the skin at bedtime.   insulin lispro (HUMALOG) 100 UNIT/ML injection Inject 1-7 Units into the skin See admin instructions. Per sliding scale 3 times daily with meals 151-200= 1 unit 201-250= 2 units 251-300= 3 units 301-350= 5 units 351-400= 7 units Greater than 400 call md   losartan (COZAAR) 50 MG tablet Take 1 tablet (50 mg total) by mouth at bedtime.   multivitamin (RENA-VIT) TABS tablet Take 1 tablet by mouth at bedtime.   sevelamer carbonate (  RENVELA) 800 MG tablet Take 2 tablets (1,600 mg total) by mouth 3 (three) times daily with meals.     Allergies:   Amlodipine and Hydralazine   Social History   Socioeconomic History   Marital status: Single    Spouse name: Not on file   Number of children: Not on file   Years of education: Not on file   Highest education level: Not on file  Occupational History   Occupation: disabled  Tobacco Use   Smoking status: Never   Smokeless tobacco: Never  Vaping Use   Vaping Use:  Never used  Substance and Sexual Activity   Alcohol use: Not Currently    Comment: social drinker   Drug use: No   Sexual activity: Yes  Other Topics Concern   Not on file  Social History Narrative   Not on file   Social Determinants of Health   Financial Resource Strain: Not on file  Food Insecurity: Not on file  Transportation Needs: Not on file  Physical Activity: Not on file  Stress: Not on file  Social Connections: Not on file     Family History: The patient's family history includes Cancer in his mother; Diabetes in his father; Hypertension in his father; Stroke in his father.  ROS:   Please see the history of present illness.     EKGs/Labs/Other Studies Reviewed:    Recent Labs: 11/25/2020: TSH 1.364 02/13/2021: Magnesium 2.1 03/11/2021: ALT 61 03/12/2021: B Natriuretic Peptide 4,361.1 03/23/2021: BUN 64; Creatinine, Ser 9.10; Hemoglobin 9.9; Platelets 240; Potassium 4.3; Sodium 136   Recent Lipid Panel Lab Results  Component Value Date/Time   CHOL 146 09/16/2020 01:56 PM   TRIG 82 09/16/2020 01:56 PM   HDL 56 09/16/2020 01:56 PM   LDLCALC 74 09/16/2020 01:56 PM    Physical Exam:    VS:  BP 136/80 (BP Location: Right Arm, Patient Position: Sitting, Cuff Size: Large)   Pulse 78   Ht 6' (1.829 m)   Wt 206 lb (93.4 kg)   BMI 27.94 kg/m    No data found.  Wt Readings from Last 3 Encounters:  06/02/21 206 lb (93.4 kg)  05/20/21 205 lb (93 kg)  04/16/21 203 lb (92.1 kg)     GEN:  Well nourished, well developed in no acute distress HEENT: Normal CARDIAC: RRR, no murmurs, rubs, gallops RESPIRATORY:  Clear to auscultation without rales, wheezing or rhonchi  ABDOMEN: Soft, non-tender, non-distended MUSCULOSKELETAL: No edema; No deformity  SKIN: Warm and dry NEUROLOGIC:  Alert and oriented PSYCHIATRIC:  Normal affect     ASSESSMENT AND PLAN   Hypertrophic cardiopathy -severe left ventricular wall thickness of 2.3 cm, thickened RV wall, ejection fraction  of 45 to 50%, No evidence of obstructive physiology on echocardiogram per Dr. Marlou Porch -abnormal EKG with LVH pattern  -No early family history of sudden cardiac death, no unexplained syncope. -Continue Coreg 25 mg twice daily. -Recommend lower resistance exercise.    Hypertension, reasonably controlled -Continue medications. -Goal BP is <130/80.  Recommend DASH diet (high in vegetables, fruits, low-fat dairy products, whole grains, poultry, fish, and nuts and low in sweets, sugar-sweetened beverages, and red meats), salt restriction and increase physical activity.  Disposition - Follow-up in 1 year unless worsening palpitations or if presyncopal/syncopal episodes occur.  Discussed most important thing for him is to attend dialysis regularly, take medications regularly and get diabetes better control to reduce cardiovascular risk.    Signed, Warren Lacy, PA-C  06/02/2021 9:48 AM  [facility]

## 2021-06-02 ENCOUNTER — Ambulatory Visit (INDEPENDENT_AMBULATORY_CARE_PROVIDER_SITE_OTHER): Payer: Medicaid Other | Admitting: Physician Assistant

## 2021-06-02 ENCOUNTER — Other Ambulatory Visit: Payer: Self-pay

## 2021-06-02 VITALS — BP 136/80 | HR 78 | Ht 72.0 in | Wt 206.0 lb

## 2021-06-02 DIAGNOSIS — I422 Other hypertrophic cardiomyopathy: Secondary | ICD-10-CM | POA: Diagnosis not present

## 2021-06-02 DIAGNOSIS — I10 Essential (primary) hypertension: Secondary | ICD-10-CM | POA: Diagnosis not present

## 2021-06-02 NOTE — Patient Instructions (Signed)
Medication Instructions:  No Changes *If you need a refill on your cardiac medications before your next appointment, please call your pharmacy*   Lab Work: No Changes If you have labs (blood work) drawn today and your tests are completely normal, you will receive your results only by: [place] (if you have MyChart) OR A paper copy in the mail If you have any lab test that is abnormal or we need to change your treatment, we will call you to review the results.   Testing/Procedures: No Testing   Follow-Up: At [facility], you and your health needs are our priority.  As part of our continuing mission to provide you with exceptional heart care, we have created designated Provider Care Teams.  These Care Teams include your primary Cardiologist (physician) and Advanced Practice Providers (APPs -  Physician Assistants and Nurse Practitioners) who all work together to provide you with the care you need, when you need it.  We recommend signing up for the patient portal called "MyChart".  Sign up information is provided on this After Visit Summary.  MyChart is used to connect with patients for Virtual Visits (Telemedicine).  Patients are able to view lab/test results, encounter notes, upcoming appointments, etc.  Non-urgent messages can be sent to your provider as well.   To learn more about what you can do with MyChart, go to [email or web address].    Your next appointment:   1 year(s)  The format for your next appointment:   In Person  Provider:   Candee Furbish, MD

## 2021-06-16 ENCOUNTER — Encounter: Payer: Self-pay | Admitting: Internal Medicine

## 2021-06-16 ENCOUNTER — Other Ambulatory Visit: Payer: Self-pay

## 2021-06-16 ENCOUNTER — Ambulatory Visit (INDEPENDENT_AMBULATORY_CARE_PROVIDER_SITE_OTHER): Payer: Medicaid Other | Admitting: Internal Medicine

## 2021-06-16 VITALS — BP 148/85 | HR 76 | Temp 98.0°F | Resp 16 | Wt 212.0 lb

## 2021-06-16 DIAGNOSIS — Z205 Contact with and (suspected) exposure to viral hepatitis: Secondary | ICD-10-CM

## 2021-06-16 NOTE — Progress Notes (Signed)
RFV: follow up for hep B exposure Patient ID: [name], male   DOB: 09-24-1985, 35 y.o.   MRN: [id number]  HPI [name] is a 34yo M with IDDM, HTN with ESRD on HD. He has been 3 months  out since hep B exposure from HD machine ( he is hep B immune) HD going well. BS usually in mid 140s. Had episode of hypoglycemia last night of BS 53 yesterday.    No jaundice. No RUQ pain   ROS: not having fever or chills. Has diarrhea with low BS. Otherwise 12 point ros is negative  Sochx: no smoking or etoh use.  Outpatient Encounter Medications as of 06/16/2021  Medication Sig   acetaminophen (TYLENOL) 325 MG tablet Take 2 tablets (650 mg total) by mouth every 4 (four) hours as needed for headache or mild pain.   atorvastatin (LIPITOR) 40 MG tablet TAKE 1 TABLET (40 MG TOTAL) BY MOUTH DAILY. (Patient taking differently: Take 40 mg by mouth at bedtime.)   carvedilol (COREG) 25 MG tablet Take 1 tablet (25 mg total) by mouth 2 (two) times daily with a meal.   doxercalciferol ([name]) 4 MCG/2ML injection Inject 2.5 mLs (5 mcg total) into the vein every Monday, Wednesday, and Friday with hemodialysis.   epoetin alfa-epbx (RETACRIT) 03500 UNIT/ML injection Inject 20,000 Units into the vein every Monday.   imipramine (TOFRANIL) 25 MG tablet Take 1 tablet (25 mg total) by mouth at bedtime.   insulin glargine-yfgn (SEMGLEE) 100 UNIT/ML injection Inject 0.16 mLs (16 Units total) into the skin at bedtime.   insulin lispro (HUMALOG) 100 UNIT/ML injection Inject 1-7 Units into the skin See admin instructions. Per sliding scale 3 times daily with meals 151-200= 1 unit 201-250= 2 units 251-300= 3 units 301-350= 5 units 351-400= 7 units Greater than 400 call md   losartan (COZAAR) 50 MG tablet Take 1 tablet (50 mg total) by mouth at bedtime.   multivitamin (RENA-VIT) TABS tablet Take 1 tablet by mouth at bedtime.   sevelamer carbonate (RENVELA) 800 MG tablet Take 2 tablets (1,600 mg total) by mouth 3  (three) times daily with meals.   LANTUS SOLOSTAR 100 UNIT/ML Solostar Pen Inject into the skin. (Patient not taking: Reported on 06/16/2021)   ondansetron (ZOFRAN) 4 MG tablet Take 4 mg by mouth every 8 (eight) hours as needed.   pantoprazole (PROTONIX) 40 MG tablet Take 1 tablet (40 mg total) by mouth daily.   Facility-Administered Encounter Medications as of 06/16/2021  Medication   COVID-19 mRNA Vac-[employer]) injection 0.3 mL     Patient Active Problem List   Diagnosis Date Noted   Acute respiratory failure with hypoxia ([place]) 03/12/2021   ESRD (end stage renal disease) on dialysis ([place]) 03/12/2021   Hypertrophic cardiomyopathy ([place]) 02/26/2021   Syncope 02/08/2021   DKA (diabetic ketoacidosis) ([place]) 02/08/2021   Seizure-like activity ([place])    Hyperglycemia    Acute hypoxemic respiratory failure ([place]) 11/25/2020   Diabetic foot ulcer ([place]) 11/24/2020   Hypervolemia associated with renal insufficiency 08/07/2020   Uremia 93/81/8299   Metabolic acidosis 37/16/9678   Hyperkalemia 08/07/2020   Nausea and vomiting 05/07/2020   Anemia of chronic kidney failure 05/07/2020   Dehydration    Hypertensive urgency 02/25/2020   Acute renal failure superimposed on stage 4 chronic kidney disease ([place]) 12/02/2019   Anemia 12/02/2019   Essential hypertension 12/02/2019   SOB (shortness of breath) 12/02/2019   DM2 (diabetes mellitus, type 2) ([place]) 12/02/2019   Diarrhea 03/23/2017  Early satiety 03/23/2017   Generalized abdominal pain 03/23/2017     Health Maintenance Due  Topic Date Due   COVID-19 Vaccine (1) Never done   Pneumococcal Vaccine 89-28 Years old (1 - PCV) Never done   FOOT EXAM  Never done   OPHTHALMOLOGY EXAM  Never done   TETANUS/TDAP  Never done   INFLUENZA VACCINE  Never done     Review of Systems  Physical Exam   BP (!) 148/85   Pulse 76   Temp 98 F (36.7 C) (Temporal)   Resp 16   Wt 212 lb (96.2 kg)   SpO2 100%   BMI 28.75 kg/m    Physical Exam   Constitutional: He is oriented to person, place, and time. He appears well-developed and well-nourished. No distress.  HENT:  Mouth/Throat: Oropharynx is clear and moist. No oropharyngeal exudate.  Cardiovascular: Normal rate, regular rhythm and normal heart sounds. Exam reveals no gallop and no friction rub.  No murmur heard.  Pulmonary/Chest: Effort normal and breath sounds normal. No respiratory distress. He has no wheezes.  Abdominal: Soft. Bowel sounds are normal. He exhibits no distension. There is no tenderness.  Lymphadenopathy:  He has no cervical adenopathy.  Neurological: He is alert and oriented to person, place, and time.  Skin: Skin is warm and dry. No rash noted. No erythema.  Psychiatric: He has a normal mood and affect. His behavior is normal.   Lab Results  Component Value Date   HEPBSAB REACTIVE (A) 04/16/2021   No results found for: RPR, LABRPR  CBC Lab Results  Component Value Date   WBC 6.5 03/23/2021   RBC 3.33 (L) 03/23/2021   HGB 9.9 (L) 03/23/2021   HCT 29.0 (L) 03/23/2021   PLT 240 03/23/2021   MCV 83.2 03/23/2021   MCH 26.1 03/23/2021   MCHC 31.4 03/23/2021   RDW 18.0 (H) 03/23/2021   LYMPHSABS 0.7 03/23/2021   MONOABS 0.4 03/23/2021   EOSABS 0.1 03/23/2021    BMET Lab Results  Component Value Date   NA 136 03/23/2021   K 4.3 03/23/2021   CL 98 03/23/2021   CO2 24 03/23/2021   GLUCOSE 95 03/23/2021   BUN 64 (H) 03/23/2021   CREATININE 9.10 (H) 03/23/2021   CALCIUM 8.8 (L) 03/23/2021   GFRNONAA 7 (L) 03/23/2021   GFRAA 19 (L) 03/10/2020      Assessment and Plan  Hepatitis b exposure - wrap for your 3 months since exposure.  - check hep B s Ab and VL to ensure still immune  - health maintenance = had covid in august, now is time to get primary vaccine series - plan to get it through HD. Has already had flu shot.

## 2021-06-19 LAB — HEPATITIS B DNA, ULTRAQUANTITATIVE, PCR
Hepatitis B DNA (Calc): <1 Log IU/mL
Hepatitis B DNA: <10 IU/mL

## 2021-06-24 ENCOUNTER — Ambulatory Visit: Payer: Medicaid Other | Admitting: Gastroenterology

## 2021-07-09 ENCOUNTER — Other Ambulatory Visit: Payer: Self-pay

## 2021-10-23 ENCOUNTER — Encounter (HOSPITAL_COMMUNITY): Payer: Self-pay

## 2021-10-23 ENCOUNTER — Emergency Department (HOSPITAL_COMMUNITY)
Admission: EM | Admit: 2021-10-23 | Discharge: 2021-10-23 | Disposition: A | Discharge status: Home / Self Care | Payer: Medicaid Other | Attending: Emergency Medicine | Admitting: Emergency Medicine

## 2021-10-23 DIAGNOSIS — N186 End stage renal disease: Secondary | ICD-10-CM | POA: Diagnosis not present

## 2021-10-23 DIAGNOSIS — Z4802 Encounter for removal of sutures: Secondary | ICD-10-CM | POA: Insufficient documentation

## 2021-10-23 DIAGNOSIS — T82898A Other specified complication of vascular prosthetic devices, implants and grafts, initial encounter: Secondary | ICD-10-CM | POA: Diagnosis not present

## 2021-10-23 DIAGNOSIS — I120 Hypertensive chronic kidney disease with stage 5 chronic kidney disease or end stage renal disease: Secondary | ICD-10-CM | POA: Diagnosis not present

## 2021-10-23 DIAGNOSIS — T829XXA Unspecified complication of cardiac and vascular prosthetic device, implant and graft, initial encounter: Secondary | ICD-10-CM

## 2021-10-23 DIAGNOSIS — Z992 Dependence on renal dialysis: Secondary | ICD-10-CM | POA: Diagnosis not present

## 2021-10-23 DIAGNOSIS — Z79899 Other long term (current) drug therapy: Secondary | ICD-10-CM | POA: Diagnosis not present

## 2021-10-23 NOTE — Discharge Instructions (Signed)
Like we discussed, please discuss the bleeding fistula with your dialysis center at your visit tomorrow.  If they are comfortable placing Xeroform over the wound, I think that this would help the scab healed over the fistula and keep the bleeding from recurring. ? ?If you develop any new or worsening symptoms please come back to the emergency department.  Please follow-up symptoms as well as visit today. ?

## 2021-10-23 NOTE — ED Notes (Signed)
Patient had to leave to catch his ride before the complete discharge process could be completed. Discharge instruction given. ?

## 2021-10-23 NOTE — ED Provider Notes (Signed)
?[facility] DEPT ?Provider Note ? ? ?CSN: [id number] ?Arrival date & time: 10/23/21  1242 ? ?  ? ?History ? ?Chief Complaint  ?Patient presents with  ? Bleeding from Fistula  ? Suture / Staple Removal  ? ? ?[name] is a 36 y.o. male. ? ?HPI ?Patient is a 36 year old male with a history of ESRD on HD Tuesday/Thursday/Saturday, who presents to the emergency department due to a bleeding AV fistula in the left arm.  He states that he has had a scab in the region recently.  He states that he had a normal dialysis session yesterday and when removing the bandaging this morning he noticed a small amount of bleeding and because he is also visually impaired was unable to assess the amount of bleeding so he called EMS. He states that his bleeding resolved before EMS arrived.  Denies any recurrence of the bleeding.   ? ?Patient also notes that he had a biopsy in February this year with dermatology.  He has a suture to the right medial thigh from the procedure but was unable to make it back to dermatology so this is still intact.  Denies any pain or swelling in the region.  Request suture removal. ?  ? ?Home Medications ?Prior to Admission medications   ?Medication Sig Start Date End Date Taking? Authorizing Provider  ?acetaminophen (TYLENOL) 325 MG tablet Take 2 tablets (650 mg total) by mouth every 4 (four) hours as needed for headache or mild pain. 03/05/21   Samella Parr, NP  ?atorvastatin (LIPITOR) 40 MG tablet TAKE 1 TABLET (40 MG TOTAL) BY MOUTH DAILY. ?Patient taking differently: Take 40 mg by mouth at bedtime. 09/06/20 09/06/21  Gildardo Pounds, NP  ?carvedilol (COREG) 25 MG tablet Take 1 tablet (25 mg total) by mouth 2 (two) times daily with a meal. 03/05/21   Samella Parr, NP  ?doxercalciferol ([name]) 4 MCG/2ML injection Inject 2.5 mLs (5 mcg total) into the vein every Monday, Wednesday, and Friday with hemodialysis. 03/06/21   Samella Parr, NP  ?epoetin alfa-epbx  (RETACRIT) 30160 UNIT/ML injection Inject 20,000 Units into the vein every Monday.    [provider]  ?imipramine (TOFRANIL) 25 MG tablet Take 1 tablet (25 mg total) by mouth at bedtime. 03/05/21   Samella Parr, NP  ?insulin glargine-yfgn (SEMGLEE) 100 UNIT/ML injection Inject 0.16 mLs (16 Units total) into the skin at bedtime. 03/05/21   Samella Parr, NP  ?insulin lispro (HUMALOG) 100 UNIT/ML injection Inject 1-7 Units into the skin See admin instructions. Per sliding scale 3 times daily with meals ?151-200= 1 unit ?201-250= 2 units ?251-300= 3 units ?301-350= 5 units ?351-400= 7 units ?Greater than 400 call md    [provider]  ?LANTUS SOLOSTAR 100 UNIT/ML Solostar Pen Inject into the skin. ?Patient not taking: Reported on 06/16/2021 04/18/21   [provider]  ?losartan (COZAAR) 50 MG tablet Take 1 tablet (50 mg total) by mouth at bedtime. 03/05/21   Samella Parr, NP  ?multivitamin (RENA-VIT) TABS tablet Take 1 tablet by mouth at bedtime. 11/27/20   Lavina Hamman, MD  ?ondansetron (ZOFRAN) 4 MG tablet Take 4 mg by mouth every 8 (eight) hours as needed. 04/12/21   [provider]  ?pantoprazole (PROTONIX) 40 MG tablet Take 1 tablet (40 mg total) by mouth daily. 03/18/21 04/17/21  Antonieta Pert, MD  ?sevelamer carbonate (RENVELA) 800 MG tablet Take 2 tablets (1,600 mg total) by mouth 3 (three)  times daily with meals. 03/05/21   Samella Parr, NP  ?   ? ?Allergies    ?Amlodipine and Hydralazine   ? ?Review of Systems   ?Review of Systems  ?Constitutional:  Negative for fever.  ?Musculoskeletal:  Negative for myalgias.  ?Skin:  Positive for wound. Negative for color change.  ?Neurological:  Negative for syncope and weakness.  ? ?Physical Exam ?Updated Vital Signs ?BP (!) 129/91 (BP Location: Right Arm)   Pulse 96   Temp 98.2 ?F (36.8 ?C) (Oral)   Resp 20   SpO2 100%  ?Physical Exam ?Vitals and nursing note reviewed.  ?Constitutional:   ?   General: He is not in acute  distress. ?   Appearance: He is well-developed.  ?HENT:  ?   Head: Normocephalic and atraumatic.  ?   Right Ear: External ear normal.  ?   Left Ear: External ear normal.  ?Eyes:  ?   General: No scleral icterus.    ?   Right eye: No discharge.     ?   Left eye: No discharge.  ?   Conjunctiva/sclera: Conjunctivae normal.  ?Neck:  ?   Trachea: No tracheal deviation.  ?Cardiovascular:  ?   Rate and Rhythm: Normal rate.  ?   Comments: AV fistula in the left arm. Good thrill. Small scab overlying the fistula. No tenderness or bleeding noted. ?Pulmonary:  ?   Effort: Pulmonary effort is normal. No respiratory distress.  ?   Breath sounds: No stridor.  ?Abdominal:  ?   General: There is no distension.  ?Musculoskeletal:     ?   General: No swelling or deformity.  ?   Cervical back: Neck supple.  ?Skin: ?   General: Skin is warm and dry.  ?   Findings: No rash.  ?   Comments: 1 intact prolene suture noted to the right medial thigh. Wound appears to have healed well. No swelling, redness or drainage noted. No tenderness in the region.  ?Neurological:  ?   Mental Status: He is alert.  ?   Cranial Nerves: Cranial nerve deficit: no gross deficits.  ? ? ?ED Results / Procedures / Treatments   ?Labs ?(all labs ordered are listed, but only abnormal results are displayed) ?Labs Reviewed - No data to display ? ?EKG ?None ? ?Radiology ?No results found. ? ?Procedures ?Marland KitchenSuture Removal ? ?Date/Time: 10/23/2021 3:30 PM ?Performed by: Rayna Sexton, PA-C ?Authorized by: Rayna Sexton, PA-C  ? ?Consent:  ?  Consent obtained:  Verbal ?  Consent given by:  Patient ?  Risks discussed:  Bleeding and pain ?Universal protocol:  ?  Procedure explained and questions answered to patient or proxy's satisfaction: yes   ?  Relevant documents present and verified: yes   ?  Test results available: yes   ?  Imaging studies available: yes   ?  Required blood products, implants, devices, and special equipment available: yes   ?  Site/side marked: yes    ?  Immediately prior to procedure, a time out was called: yes   ?  Patient identity confirmed:  Verbally with patient ?Location:  ?  Location: right medial thigh. ?Procedure details:  ?  Wound appearance:  No signs of infection, good wound healing and clean ?  Number of sutures removed:  1 ?Post-procedure details:  ?  Post-removal:  No dressing applied ?  Procedure completion:  Tolerated well, no immediate complications  ? ?Medications Ordered in ED ?Medications - No data to  display ? ?ED Course/ Medical Decision Making/ A&P ?  ?                        ?Medical Decision Making ?Patient is a 36 year old presents to the emergency department for assessment of bleeding from an AV fistula after being dialyzed yesterday and when removing the bandaging today he noticed bleeding at the site and because he is visually impaired was unable to assess the severity so he called EMS. Bleeding had stopped prior to EMS arrival.  ? ?On my exam patient's left AV fistula has a good thrill.  There is a small scab in the region that was likely the source of the bleeding.  No current bleeding.  Patient states that he feels that the scab has been there for some time.  He states that when he removes the bandaging from the dialysis center that it reopens the scab.  Patient given Xeroform to take with him to use at the dialysis center which will hopefully help moisturize the region and help the scab heal so that he does not have any recurrent bleeding when removing the bandaging after dialysis. ? ?Patient also notes that he had a skin biopsy to the right medial thigh in February of this year by dermatology.  He was unable to make it back to dermatology to have the suture removed so it is still intact.  Wound appears to have healed well.  Region does not appear infected.  Afebrile, nontachycardic, and nontoxic-appearing.  Suture was removed successfully.  Patient tolerated the procedure well. ? ?Patient appears stable for discharge at this  time and he is agreeable.  Urged patient to return the emergency department with any new or worsening symptoms.  His questions were answered and he was amicable at the time of discharge. ?Final Clinical Impression(

## 2021-10-23 NOTE — ED Provider Triage Note (Signed)
Emergency Medicine Provider Triage Evaluation Note ? ?[name] , a 36 y.o. male  was evaluated in triage.  Pt complains of two complaints ? ?He reports he has had bleeding from his dialysis site that's now stopped.   ? ?He also reports that he has two sutures in his leg since months ago put in by a dermatologist at [facility].  ? ? ?Physical Exam  ?BP (!) 129/91 (BP Location: Right Arm)   Pulse 96   Temp 98.2 ?F (36.8 ?C) (Oral)   Resp 20   SpO2 100%  ?Gen:   Awake, no distress , no active bleeding from fistula site.  ?Resp:  Normal effort  ?MSK:   Moves extremities without difficulty  ?Other:  Suture site reported left upper leg, unable to view in triage setting  ? ?Medical Decision Making  ?Medically screening exam initiated at 1:44 PM.  Appropriate orders placed.  WARNER LADUCA was informed that the remainder of the evaluation will be completed by another provider, this initial triage assessment does not replace that evaluation, and the importance of remaining in the ED until their evaluation is complete. ? ? ?  ?Lorin Glass, PA-C ?10/23/21 1347 ? ?

## 2021-10-23 NOTE — ED Triage Notes (Signed)
No bleeding noted at this time. Pt also requesting to get stitches removed from leg.  ?

## 2021-10-23 NOTE — ED Triage Notes (Addendum)
Per EMS, dialysis T/Th/S, last dialyzed yesterday. Patient reports dressing noted to have blood on it this morning. Bleeding controlled at this time. ?

## 2021-10-30 ENCOUNTER — Other Ambulatory Visit: Payer: Self-pay

## 2021-10-30 ENCOUNTER — Emergency Department (HOSPITAL_COMMUNITY): Payer: Medicaid Other

## 2021-10-30 ENCOUNTER — Observation Stay (HOSPITAL_COMMUNITY)
Admission: EM | Admit: 2021-10-30 | Discharge: 2021-10-31 | Disposition: A | Discharge status: Home / Self Care | Payer: Medicaid Other | Attending: Internal Medicine | Admitting: Internal Medicine

## 2021-10-30 ENCOUNTER — Encounter (HOSPITAL_COMMUNITY): Payer: Self-pay | Admitting: Emergency Medicine

## 2021-10-30 DIAGNOSIS — J9601 Acute respiratory failure with hypoxia: Secondary | ICD-10-CM | POA: Diagnosis not present

## 2021-10-30 DIAGNOSIS — R52 Pain, unspecified: Secondary | ICD-10-CM | POA: Diagnosis not present

## 2021-10-30 DIAGNOSIS — N186 End stage renal disease: Secondary | ICD-10-CM

## 2021-10-30 DIAGNOSIS — E11649 Type 2 diabetes mellitus with hypoglycemia without coma: Secondary | ICD-10-CM | POA: Diagnosis not present

## 2021-10-30 DIAGNOSIS — I120 Hypertensive chronic kidney disease with stage 5 chronic kidney disease or end stage renal disease: Secondary | ICD-10-CM | POA: Diagnosis not present

## 2021-10-30 DIAGNOSIS — Z23 Encounter for immunization: Secondary | ICD-10-CM | POA: Insufficient documentation

## 2021-10-30 DIAGNOSIS — N289 Disorder of kidney and ureter, unspecified: Secondary | ICD-10-CM

## 2021-10-30 DIAGNOSIS — E119 Type 2 diabetes mellitus without complications: Secondary | ICD-10-CM

## 2021-10-30 DIAGNOSIS — E1122 Type 2 diabetes mellitus with diabetic chronic kidney disease: Secondary | ICD-10-CM | POA: Insufficient documentation

## 2021-10-30 DIAGNOSIS — Z794 Long term (current) use of insulin: Secondary | ICD-10-CM | POA: Diagnosis not present

## 2021-10-30 DIAGNOSIS — Z992 Dependence on renal dialysis: Secondary | ICD-10-CM | POA: Diagnosis not present

## 2021-10-30 DIAGNOSIS — I422 Other hypertrophic cardiomyopathy: Secondary | ICD-10-CM

## 2021-10-30 DIAGNOSIS — E109 Type 1 diabetes mellitus without complications: Secondary | ICD-10-CM

## 2021-10-30 DIAGNOSIS — E875 Hyperkalemia: Secondary | ICD-10-CM | POA: Diagnosis not present

## 2021-10-30 DIAGNOSIS — E162 Hypoglycemia, unspecified: Secondary | ICD-10-CM

## 2021-10-30 DIAGNOSIS — Z79899 Other long term (current) drug therapy: Secondary | ICD-10-CM | POA: Insufficient documentation

## 2021-10-30 DIAGNOSIS — E8770 Fluid overload, unspecified: Secondary | ICD-10-CM | POA: Diagnosis not present

## 2021-10-30 DIAGNOSIS — I10 Essential (primary) hypertension: Secondary | ICD-10-CM | POA: Diagnosis present

## 2021-10-30 DIAGNOSIS — R0602 Shortness of breath: Secondary | ICD-10-CM | POA: Diagnosis present

## 2021-10-30 LAB — COMPREHENSIVE METABOLIC PANEL
ALT: 24 U/L (ref 0–44)
AST: 13 U/L — ABNORMAL LOW (ref 15–41)
Albumin: 3.8 g/dL (ref 3.5–5.0)
Alkaline Phosphatase: 71 U/L (ref 38–126)
Anion gap: 18 — ABNORMAL HIGH (ref 5–15)
BUN: 77 mg/dL — ABNORMAL HIGH (ref 6–20)
CO2: 22 mmol/L (ref 22–32)
Calcium: 7.9 mg/dL — ABNORMAL LOW (ref 8.9–10.3)
Chloride: 100 mmol/L (ref 98–111)
Creatinine, Ser: 14.83 mg/dL — ABNORMAL HIGH (ref 0.61–1.24)
GFR, Estimated: 4 mL/min — ABNORMAL LOW (ref >60)
Glucose, Bld: 111 mg/dL — ABNORMAL HIGH (ref 70–99)
Potassium: 6.7 mmol/L (ref 3.5–5.1)
Sodium: 140 mmol/L (ref 135–145)
Total Bilirubin: 1 mg/dL (ref 0.3–1.2)
Total Protein: 8 g/dL (ref 6.5–8.1)

## 2021-10-30 LAB — CBC WITH DIFFERENTIAL/PLATELET
Abs Immature Granulocytes: 0.04 10*3/uL (ref 0.00–0.07)
Basophils Absolute: 0 10*3/uL (ref 0.0–0.1)
Basophils Relative: 0 %
Eosinophils Absolute: 0.1 10*3/uL (ref 0.0–0.5)
Eosinophils Relative: 1 %
HCT: 25.3 % — ABNORMAL LOW (ref 39.0–52.0)
Hemoglobin: 8.3 g/dL — ABNORMAL LOW (ref 13.0–17.0)
Immature Granulocytes: 0 %
Lymphocytes Relative: 7 %
Lymphs Abs: 0.7 10*3/uL (ref 0.7–4.0)
MCH: 28.5 pg (ref 26.0–34.0)
MCHC: 32.8 g/dL (ref 30.0–36.0)
MCV: 86.9 fL (ref 80.0–100.0)
Monocytes Absolute: 0.5 10*3/uL (ref 0.1–1.0)
Monocytes Relative: 5 %
Neutro Abs: 8.9 10*3/uL — ABNORMAL HIGH (ref 1.7–7.7)
Neutrophils Relative %: 87 %
Platelets: 216 10*3/uL (ref 150–400)
RBC: 2.91 MIL/uL — ABNORMAL LOW (ref 4.22–5.81)
RDW: 16.5 % — ABNORMAL HIGH (ref 11.5–15.5)
WBC: 10.3 10*3/uL (ref 4.0–10.5)
nRBC: 0 % (ref 0.0–0.2)

## 2021-10-30 LAB — CBG MONITORING, ED
Glucose-Capillary: 124 mg/dL — ABNORMAL HIGH (ref 70–99)
Glucose-Capillary: 30 mg/dL — CL (ref 70–99)
Glucose-Capillary: 33 mg/dL — CL (ref 70–99)
Glucose-Capillary: 83 mg/dL (ref 70–99)
Glucose-Capillary: 204 mg/dL — ABNORMAL HIGH (ref 70–99)

## 2021-10-30 LAB — HEPATITIS B SURFACE ANTIGEN: Hepatitis B Surface Ag: NONREACTIVE

## 2021-10-30 LAB — HEPATITIS B SURFACE ANTIBODY,QUALITATIVE: Hep B S Ab: REACTIVE — AB

## 2021-10-30 LAB — TROPONIN I (HIGH SENSITIVITY)
Troponin I (High Sensitivity): 30 ng/L — ABNORMAL HIGH (ref <18)
Troponin I (High Sensitivity): 31 ng/L — ABNORMAL HIGH (ref <18)

## 2021-10-30 LAB — GLUCOSE, CAPILLARY: Glucose-Capillary: 122 mg/dL — ABNORMAL HIGH (ref 70–99)

## 2021-10-30 MED ORDER — ONDANSETRON HCL 4 MG PO TABS: 4.0000 mg | ORAL_TABLET | Freq: Four times a day (QID) | ORAL | Status: DC | PRN

## 2021-10-30 MED ORDER — HEPARIN SODIUM (PORCINE) 1000 UNIT/ML DIALYSIS
1500.0000 [IU] | INTRAMUSCULAR | Status: DC | PRN
  Filled 2021-10-30: qty 2

## 2021-10-30 MED ORDER — INSULIN ASPART 100 UNIT/ML IJ SOLN
0.0000 [IU] | Freq: Three times a day (TID) | INTRAMUSCULAR | Status: DC
  Filled 2021-10-30: qty 0.09

## 2021-10-30 MED ORDER — ACETAMINOPHEN 325 MG PO TABS
650.0000 mg | ORAL_TABLET | Freq: Once | ORAL | Status: AC
  Administered 2021-10-30: 650 mg via ORAL
  Filled 2021-10-30: qty 2

## 2021-10-30 MED ORDER — HEPARIN SODIUM (PORCINE) 5000 UNIT/ML IJ SOLN
5000.0000 [IU] | Freq: Three times a day (TID) | INTRAMUSCULAR | Status: DC
  Administered 2021-10-30: 5000 [IU] via SUBCUTANEOUS
  Filled 2021-10-30 (×3): qty 1

## 2021-10-30 MED ORDER — CLONIDINE HCL 0.1 MG PO TABS
0.1000 mg | ORAL_TABLET | Freq: Once | ORAL | Status: AC
  Administered 2021-10-30: 0.1 mg via ORAL
  Filled 2021-10-30: qty 1

## 2021-10-30 MED ORDER — SODIUM ZIRCONIUM CYCLOSILICATE 10 G PO PACK
10.0000 g | PACK | Freq: Once | ORAL | Status: AC
  Administered 2021-10-30: 10 g via ORAL
  Filled 2021-10-30: qty 1

## 2021-10-30 MED ORDER — INSULIN GLARGINE-YFGN 100 UNIT/ML ~~LOC~~ SOLN
16.0000 [IU] | Freq: Every day | SUBCUTANEOUS | Status: DC
  Filled 2021-10-30 (×2): qty 0.16

## 2021-10-30 MED ORDER — DEXTROSE 50 % IV SOLN
1.0000 | Freq: Once | INTRAVENOUS | Status: AC
  Administered 2021-10-30: 50 mL via INTRAVENOUS
  Filled 2021-10-30: qty 50

## 2021-10-30 MED ORDER — INSULIN ASPART 100 UNIT/ML IJ SOLN
10.0000 [IU] | Freq: Once | INTRAMUSCULAR | Status: AC
Start: 2021-10-30 — End: 2021-10-30
  Administered 2021-10-30: 10 [IU] via INTRAVENOUS
  Filled 2021-10-30: qty 0.1

## 2021-10-30 MED ORDER — ACETAMINOPHEN 325 MG PO TABS
650.0000 mg | ORAL_TABLET | Freq: Four times a day (QID) | ORAL | Status: DC | PRN
  Administered 2021-10-30 – 2021-10-31 (×2): 650 mg via ORAL
  Filled 2021-10-30 (×2): qty 2

## 2021-10-30 MED ORDER — ATORVASTATIN CALCIUM 40 MG PO TABS
40.0000 mg | ORAL_TABLET | Freq: Every day | ORAL | Status: DC
  Administered 2021-10-30: 40 mg via ORAL
  Filled 2021-10-30: qty 1

## 2021-10-30 MED ORDER — CALCIUM GLUCONATE-NACL 1-0.675 GM/50ML-% IV SOLN
1.0000 g | Freq: Once | INTRAVENOUS | Status: AC
  Administered 2021-10-30: 1000 mg via INTRAVENOUS
  Filled 2021-10-30: qty 50

## 2021-10-30 MED ORDER — DEXTROSE 50 % IV SOLN
25.0000 g | Freq: Once | INTRAVENOUS | Status: DC
Start: 2021-10-30 — End: 2021-10-30

## 2021-10-30 MED ORDER — DEXTROSE 50 % IV SOLN: 50.0000 mL | INTRAVENOUS | Status: DC | PRN

## 2021-10-30 MED ORDER — PANTOPRAZOLE SODIUM 40 MG PO TBEC
40.0000 mg | DELAYED_RELEASE_TABLET | Freq: Every day | ORAL | Status: DC
  Administered 2021-10-30 – 2021-10-31 (×2): 40 mg via ORAL
  Filled 2021-10-30 (×2): qty 1

## 2021-10-30 MED ORDER — HEPARIN SODIUM (PORCINE) 1000 UNIT/ML DIALYSIS: 1500.0000 [IU] | INTRAMUSCULAR | Status: DC | PRN

## 2021-10-30 MED ORDER — ACETAMINOPHEN 650 MG RE SUPP: 650.0000 mg | Freq: Four times a day (QID) | RECTAL | Status: DC | PRN

## 2021-10-30 MED ORDER — SEVELAMER CARBONATE 800 MG PO TABS
1600.0000 mg | ORAL_TABLET | Freq: Three times a day (TID) | ORAL | Status: DC
  Administered 2021-10-31 (×2): 1600 mg via ORAL
  Filled 2021-10-30 (×2): qty 2

## 2021-10-30 MED ORDER — INSULIN ASPART 100 UNIT/ML IJ SOLN
0.0000 [IU] | Freq: Three times a day (TID) | INTRAMUSCULAR | Status: DC
  Administered 2021-10-31: 1 [IU] via SUBCUTANEOUS
  Filled 2021-10-30: qty 0.06

## 2021-10-30 MED ORDER — ONDANSETRON HCL 4 MG/2ML IJ SOLN
4.0000 mg | Freq: Four times a day (QID) | INTRAMUSCULAR | Status: DC | PRN
  Administered 2021-10-30: 4 mg via INTRAVENOUS
  Filled 2021-10-30: qty 2

## 2021-10-30 MED ORDER — DEXTROSE 10 % IV SOLN: INTRAVENOUS | Status: DC

## 2021-10-30 NOTE — Consult Note (Signed)
Renal Service ?Consult Note ?[facility] ? ?[place] ?10/30/2021 ?James Blazing, MD ?Requesting Physician: Dr. Patsy Baltimore ? ?Reason for Consult: ESRD pt missed HD w/ hyperkalemia and pulm edema ?HPI: The patient is a 36 y.o. year-old w/ hx of DM2 on insulin, ESRD on HD, HL, HTN, HCM who presented today to ED after missing HD yesterday due to transportation issues. C/o increasing SOB and body aches/ gen weakness. Pt given 2L [place] O2 in ED.  Labs returned showing K+ 6.7. EKG peaked T's. CXR +edema. Asked to see for HD.  ? ?Pt seen in ED. Pt started HD in Feb 2022.  Goes to [facility] in [place].  Main c/o is diffuse pain and SOB.  Says his transportation didn't pick him up yesterday. Today feels very weak and SOB.  ? ?ROS - denies CP, no joint pain, no HA, no blurry vision, no rash, no diarrhea, no nausea/ vomiting ? ? ?Past Medical History  ?Past Medical History:  ?Diagnosis Date  ? Diabetes mellitus   ? ESRD on hemodialysis ([place])   ? High cholesterol   ? Hypertension   ? Hypertrophic cardiomyopathy ([place]) 02/26/2021  ? ?Past Surgical History  ?Past Surgical History:  ?Procedure Laterality Date  ? AV FISTULA PLACEMENT Left 08/11/2020  ? Procedure: LEFT UPPER EXTREMITY ARTERIOVENOUS (AV) FISTULA CREATION;  Surgeon: Cherre Robins, MD;  Location: [facility];  Service: Vascular;  Laterality: Left;  ? FRACTURE SURGERY    ? I & D EXTREMITY Left 07/16/2014  ? Procedure: IRRIGATION AND DEBRIDEMENT EXTREMITY/LEFT INDEX FINGER;  Surgeon: Leanora Cover, MD;  Location: [facility];  Service: Orthopedics;  Laterality: Left;  ? IR PERC TUN PERIT CATH WO PORT S&I /IMAG  08/07/2020  ? IR US GUIDE VASC ACCESS RIGHT  08/07/2020  ? ?Family History  ?Family History  ?Problem Relation Age of Onset  ? Cancer Mother   ? Stroke Father   ? Diabetes Father   ? Hypertension Father   ? ?Social History  reports that he has never smoked. He has never used smokeless tobacco. He reports that he does not currently use alcohol. He reports that he  does not use drugs. ?Allergies  ?Allergies  ?Allergen Reactions  ? Amlodipine Nausea And Vomiting and Other (See Comments)  ?  Patient was taking Amlodipine and Hydralazine at the same time, so the reactions came from one of the 2: Lethargy and an all-over feeling of NOT feeling well (also)  ? Hydralazine Nausea And Vomiting and Other (See Comments)  ?  Patient was taking Hydralazine AND Amlodipine at the same time, so the reactions came from one of the 2: Lethargy and an all-over feeling of NOT feeling well (also)  ? ?Home medications ?Prior to Admission medications   ?Medication Sig Start Date End Date Taking? Authorizing Provider  ?acetaminophen (TYLENOL) 325 MG tablet Take 2 tablets (650 mg total) by mouth every 4 (four) hours as needed for headache or mild pain. 03/05/21   Samella Parr, NP  ?atorvastatin (LIPITOR) 40 MG tablet TAKE 1 TABLET (40 MG TOTAL) BY MOUTH DAILY. ?Patient taking differently: Take 40 mg by mouth at bedtime. 09/06/20 09/06/21  Gildardo Pounds, NP  ?carvedilol (COREG) 25 MG tablet Take 1 tablet (25 mg total) by mouth 2 (two) times daily with a meal. 03/05/21   Samella Parr, NP  ?doxercalciferol ([name]) 4 MCG/2ML injection Inject 2.5 mLs (5 mcg total) into the vein every Monday, Wednesday, and Friday with hemodialysis. 03/06/21  Samella Parr, NP  ?epoetin alfa-epbx (RETACRIT) 23762 UNIT/ML injection Inject 20,000 Units into the vein every Monday.    [provider]  ?imipramine (TOFRANIL) 25 MG tablet Take 1 tablet (25 mg total) by mouth at bedtime. 03/05/21   Samella Parr, NP  ?insulin glargine-yfgn (SEMGLEE) 100 UNIT/ML injection Inject 0.16 mLs (16 Units total) into the skin at bedtime. 03/05/21   Samella Parr, NP  ?insulin lispro (HUMALOG) 100 UNIT/ML injection Inject 1-7 Units into the skin See admin instructions. Per sliding scale 3 times daily with meals ?151-200= 1 unit ?201-250= 2 units ?251-300= 3 units ?301-350= 5 units ?351-400= 7 units ?Greater than  400 call md    [provider]  ?LANTUS SOLOSTAR 100 UNIT/ML Solostar Pen Inject into the skin. ?Patient not taking: Reported on 06/16/2021 04/18/21   [provider]  ?losartan (COZAAR) 50 MG tablet Take 1 tablet (50 mg total) by mouth at bedtime. 03/05/21   Samella Parr, NP  ?multivitamin (RENA-VIT) TABS tablet Take 1 tablet by mouth at bedtime. 11/27/20   Lavina Hamman, MD  ?ondansetron (ZOFRAN) 4 MG tablet Take 4 mg by mouth every 8 (eight) hours as needed. 04/12/21   [provider]  ?pantoprazole (PROTONIX) 40 MG tablet Take 1 tablet (40 mg total) by mouth daily. 03/18/21 04/17/21  Antonieta Pert, MD  ?sevelamer carbonate (RENVELA) 800 MG tablet Take 2 tablets (1,600 mg total) by mouth 3 (three) times daily with meals. 03/05/21   Samella Parr, NP  ? ? ? ?Vitals:  ? 10/30/21 1055 10/30/21 1224 10/30/21 1300 10/30/21 1330  ?BP: (!) 178/119 (!) 174/104 (!) 169/112 (!) 172/102  ?Pulse: 99 (!) 105 97 93  ?Resp: (!) 21 (!) 26 19 (!) 22  ?Temp:      ?TempSrc:      ?SpO2: 100% 94% 96% 96%  ?Weight:      ?Height:      ? ?Exam ?Gen alert, no distress, nasal O2, slightly SOB ?No rash, cyanosis or gangrene ?Sclera anicteric, throat clear  ?+JVD ?Chest faint scattered basilar rales bilat ?RRR no RG ?Abd soft ntnd no mass or ascites +bs ?GU normal male ?MS no joint effusions or deformity ?Ext trace pretib edema, no wounds or ulcers ?Neuro is alert, Ox 3 , nf, no asterixis ?   LFA aVF+bruit ? ? ?   BP 170 102 HR 95  RR 22  temp 98    ?   Na 140  K 6.7  CO2 22  BUN 77  cR 14.8  ca 7.9  AG 18  alb 3.8 ?   WBC 10K Hb 8.3   ? ? Home meds include - lipitor, coreg 25 bid, imipramine, insulin glargine/ humalog, losartan 50 hx, renavit, protonix, renvela 2 ac tid, prns/ supps/ vits ? ? ? ? OP HD: TTS [facility] ? - from sept 2022 > 4h  450/800  2/2 bath  LFA AVF ? ?  CXR 4/21 - IMPRESSION: Cardiomegaly with probable mild interstitial pulmonary edema, suspicious for mild congestive heart  failure. ? ?Assessment/ Plan: ?Hyperkalemia - mod severe, K+ 6.7.  Getting temporizing measures in ED. Will need prioritized HD this afternoon / evening. Renal diet when eating.  ?SOB - + edema on CXR. Missed HD. Stable resp status. Max UF w/ HD today.  ?ESRD - on HD TTS, missed yesterday. HD today, will need transport [facility] by ambulance. If stable in am can be dc'd to OP HD potentially.  ?  DM2 - on insulin ?Anemia esrd - Hb 8.3, no esa needs.  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 Add on phos. Cont renvela as binder.  ?HTN - cont home meds, lower vol w/ HD ?HL ?  ? ? ? ?Kelly Splinter  MD ?10/30/2021, 2:46 PM ?Recent Labs  ?Lab 10/30/21 ?1113  ?HGB 8.3*  ?ALBUMIN 3.8  ?CALCIUM 7.9*  ?CREATININE 14.83*  ?K 6.7*  ? ? ?

## 2021-10-30 NOTE — ED Provider Notes (Signed)
?[facility] DEPT ?Provider Note ? ? ?CSN: [id number] ?Arrival date & time: 10/30/21  0957 ? ?  ? ?History ? ?Chief Complaint  ?Patient presents with  ? Shortness of Breath  ? Generalized Body Aches  ? ? ?[name] is a 36 y.o. male. ? ?36 year old male with history of end-stage renal disease presents with body aches and shortness of breath.  Patient states that he missed dialysis on Thursday.  Has had an increasing cough without fever or chills.  Has had emesis.  Denies any abdominal or chest discomfort.  No treatment use prior to arrival ? ? ?  ? ?Home Medications ?Prior to Admission medications   ?Medication Sig Start Date End Date Taking? Authorizing Provider  ?acetaminophen (TYLENOL) 325 MG tablet Take 2 tablets (650 mg total) by mouth every 4 (four) hours as needed for headache or mild pain. 03/05/21   Samella Parr, NP  ?atorvastatin (LIPITOR) 40 MG tablet TAKE 1 TABLET (40 MG TOTAL) BY MOUTH DAILY. ?Patient taking differently: Take 40 mg by mouth at bedtime. 09/06/20 09/06/21  Gildardo Pounds, NP  ?carvedilol (COREG) 25 MG tablet Take 1 tablet (25 mg total) by mouth 2 (two) times daily with a meal. 03/05/21   Samella Parr, NP  ?doxercalciferol ([name]) 4 MCG/2ML injection Inject 2.5 mLs (5 mcg total) into the vein every Monday, Wednesday, and Friday with hemodialysis. 03/06/21   Samella Parr, NP  ?epoetin alfa-epbx (RETACRIT) 01601 UNIT/ML injection Inject 20,000 Units into the vein every Monday.    [provider]  ?imipramine (TOFRANIL) 25 MG tablet Take 1 tablet (25 mg total) by mouth at bedtime. 03/05/21   Samella Parr, NP  ?insulin glargine-yfgn (SEMGLEE) 100 UNIT/ML injection Inject 0.16 mLs (16 Units total) into the skin at bedtime. 03/05/21   Samella Parr, NP  ?insulin lispro (HUMALOG) 100 UNIT/ML injection Inject 1-7 Units into the skin See admin instructions. Per sliding scale 3 times daily with meals ?151-200= 1 unit ?201-250= 2  units ?251-300= 3 units ?301-350= 5 units ?351-400= 7 units ?Greater than 400 call md    [provider]  ?LANTUS SOLOSTAR 100 UNIT/ML Solostar Pen Inject into the skin. ?Patient not taking: Reported on 06/16/2021 04/18/21   [provider]  ?losartan (COZAAR) 50 MG tablet Take 1 tablet (50 mg total) by mouth at bedtime. 03/05/21   Samella Parr, NP  ?multivitamin (RENA-VIT) TABS tablet Take 1 tablet by mouth at bedtime. 11/27/20   Lavina Hamman, MD  ?ondansetron (ZOFRAN) 4 MG tablet Take 4 mg by mouth every 8 (eight) hours as needed. 04/12/21   [provider]  ?pantoprazole (PROTONIX) 40 MG tablet Take 1 tablet (40 mg total) by mouth daily. 03/18/21 04/17/21  Antonieta Pert, MD  ?sevelamer carbonate (RENVELA) 800 MG tablet Take 2 tablets (1,600 mg total) by mouth 3 (three) times daily with meals. 03/05/21   Samella Parr, NP  ?   ? ?Allergies    ?Amlodipine and Hydralazine   ? ?Review of Systems   ?Review of Systems  ?All other systems reviewed and are negative. ? ?Physical Exam ?Updated Vital Signs ?BP (!) 185/110 (BP Location: Right Arm)   Pulse 100   Temp 98.7 ?F (37.1 ?C) (Oral)   Resp 20   Ht 1.829 m (6')   Wt 85.8 kg   SpO2 92%   BMI 25.65 kg/m?  ?Physical Exam ?Vitals and nursing note reviewed.  ?Constitutional:   ?  General: He is not in acute distress. ?   Appearance: Normal appearance. He is well-developed. He is not toxic-appearing.  ?HENT:  ?   Head: Normocephalic and atraumatic.  ?Eyes:  ?   General: Lids are normal.  ?   Conjunctiva/sclera: Conjunctivae normal.  ?   Pupils: Pupils are equal, round, and reactive to light.  ?Neck:  ?   Thyroid: No thyroid mass.  ?   Trachea: No tracheal deviation.  ?Cardiovascular:  ?   Rate and Rhythm: Normal rate and regular rhythm.  ?   Heart sounds: Normal heart sounds. No murmur heard. ?  No gallop.  ?Pulmonary:  ?   Effort: Pulmonary effort is normal. No respiratory distress.  ?   Breath sounds: Normal breath sounds. No stridor. No  decreased breath sounds, wheezing, rhonchi or rales.  ?Abdominal:  ?   General: There is no distension.  ?   Palpations: Abdomen is soft.  ?   Tenderness: There is no abdominal tenderness. There is no rebound.  ?Musculoskeletal:     ?   General: No tenderness. Normal range of motion.  ?   Cervical back: Normal range of motion and neck supple.  ?Skin: ?   General: Skin is warm and dry.  ?   Findings: No abrasion or rash.  ?Neurological:  ?   Mental Status: He is alert and oriented to person, place, and time. Mental status is at baseline.  ?   GCS: GCS eye subscore is 4. GCS verbal subscore is 5. GCS motor subscore is 6.  ?   Cranial Nerves: No cranial nerve deficit.  ?   Sensory: No sensory deficit.  ?   Motor: Motor function is intact.  ?Psychiatric:     ?   Attention and Perception: Attention normal.     ?   Speech: Speech normal.     ?   Behavior: Behavior normal.  ? ? ?ED Results / Procedures / Treatments   ?Labs ?(all labs ordered are listed, but only abnormal results are displayed) ?Labs Reviewed  ?CBG MONITORING, ED - Abnormal; Notable for the following components:  ?    Result Value  ? Glucose-Capillary 124 (*)   ? All other components within normal limits  ?COMPREHENSIVE METABOLIC PANEL  ?CBC WITH DIFFERENTIAL/PLATELET  ?TROPONIN I (HIGH SENSITIVITY)  ? ? ?EKG ?EKG Interpretation ? ?Date/Time:  Friday October 30 2021 10:03:01 EDT ?Ventricular Rate:  99 ?PR Interval:  188 ?QRS Duration: 117 ?QT Interval:  377 ?QTC Calculation: 484 ?R Axis:   76 ?Text Interpretation: Sinus rhythm Probable LVH with secondary repol abnrm Anterior Q waves, possibly due to LVH Non-specific abnormality, ST segment, and/or T-wave Confirmed by Lacretia Leigh ([id number]) on 10/30/2021 10:43:54 AM ? ?Radiology ?No results found. ? ?Procedures ?Procedures  ? ? ?Medications Ordered in ED ?Medications - No data to display ? ?ED Course/ Medical Decision Making/ A&P ?  ?                        ?Medical Decision Making ?Risk ?Prescription drug  management. ? ? ?Patient is EKG shows peaked T waves consistent with hyperkalemia per my interpretation.  Patient started on insulin and glucose as well as given calcium.  Chest x-ray per my interpretation shows fluid overload.  Patient will require emergent dialysis and discussed with nephrology, Dr. Soyla Murphy, who request that patient be admitted to [facility] he will put in orders for dialysis.  Will speak with hospitalist  for admission ? ?CRITICAL CARE ?Performed by: Leota Jacobsen ?Total critical care time: 50 minutes ?Critical care time was exclusive of separately billable procedures and treating other patients. ?Critical care was necessary to treat or prevent imminent or life-threatening deterioration. ?Critical care was time spent personally by me on the following activities: development of treatment plan with patient and/or surrogate as well as nursing, discussions with consultants, evaluation of patient's response to treatment, examination of patient, obtaining history from patient or surrogate, ordering and performing treatments and interventions, ordering and review of laboratory studies, ordering and review of radiographic studies, pulse oximetry and re-evaluation of patient's condition. ? ? ? ? ? ? ? ? ?Final Clinical Impression(s) / ED Diagnoses ?Final diagnoses:  ?None  ? ? ?Rx / DC Orders ?ED Discharge Orders   ? ? None  ? ?  ? ? ?  ?Lacretia Leigh, MD ?10/30/21 1351 ? ?

## 2021-10-30 NOTE — Progress Notes (Signed)
Contacted by renal MD regarding pt's need for out-pt HD appt for tomorrow on 2nd shift at [facility]. Spoke to [place] at clinic. Pt can receive out-pt HD treatment tomorrow. Pt will need to arrive at 10:15 for 10:30 chair time. Info provided to renal MD.  ? ?Melven Sartorius ?Renal Navigator ?[phone] ?

## 2021-10-30 NOTE — ED Notes (Signed)
Was given 230mLs of apple juice, a chicken salad, and a rice crispy treat. ?

## 2021-10-30 NOTE — ED Notes (Signed)
Assisted pt to bathroom

## 2021-10-30 NOTE — H&P (Signed)
?History and Physical  ? ? ?Patient: [name] QQP:[id number] DOB: 07-22-1985 ?DOA: 10/30/2021 ?DOS: the patient was seen and examined on 10/30/2021 ?PCP: Nolene Ebbs, MD  ?Patient coming from: Home ? ?Chief Complaint:  ?Chief Complaint  ?Patient presents with  ? Shortness of Breath  ? Generalized Body Aches  ? ?HPI: [name] is a 36 y.o. male with medical history significant of type I DM, hypertrophic cardiomyopathy, hyperlipidemia, hypertension, ESRD on hemodialysis who is coming to the emergency department with generalized body aches and shortness of breath after missing 1 hemodialysis session.  He denies fever, chills, rhinorrhea, chest pain, palpitations, diaphoresis, but complained of orthopnea.  Denied abdominal pain, nausea, vomiting, fever, chills, diarrhea, constipation, melena or hematochezia.  No flank pain or dysuria. ? ?ED course: Initial vital signs were temperature 98.7 ?F, pulse 100, respiration 20, BP 185/110 mmHg and O2 sat 92%.  The patient received calcium gluconate, dextrose 50% and 10 units of insulin IVP.  I ordered 10 g of Lokelma x1. ? ?Lab work: CBC showed Bennetts count of 10.3, hemoglobin 8.3 g/dL platelets 216.  Troponin was 30 and then 31 ng/L.  CMP significant for potassium of 6.7 mmol/L, calcium 7.9 (corrected 8.1 mg/dL) BUN of 77 and creatinine of 14.83 mg/dL.  Anion gap was 18.  LFTs were unremarkable. ? ?Imaging: Two-view chest radiograph show cardiomegaly with probably mild interstitial pulmonary edema, suspicious for mild CHF. ? ?Review of Systems: As mentioned in the history of present illness. All other systems reviewed and are negative. ?Past Medical History:  ?Diagnosis Date  ? Diabetes mellitus   ? ESRD on hemodialysis ([place])   ? High cholesterol   ? Hypertension   ? Hypertrophic cardiomyopathy ([place]) 02/26/2021  ? ?Past Surgical History:  ?Procedure Laterality Date  ? AV FISTULA PLACEMENT Left 08/11/2020  ? Procedure: LEFT UPPER EXTREMITY ARTERIOVENOUS (AV) FISTULA  CREATION;  Surgeon: Cherre Robins, MD;  Location: [place];  Service: Vascular;  Laterality: Left;  ? FRACTURE SURGERY    ? I & D EXTREMITY Left 07/16/2014  ? Procedure: IRRIGATION AND DEBRIDEMENT EXTREMITY/LEFT INDEX FINGER;  Surgeon: Leanora Cover, MD;  Location: [place];  Service: Orthopedics;  Laterality: Left;  ? IR PERC TUN PERIT CATH WO PORT S&I /IMAG  08/07/2020  ? IR US GUIDE VASC ACCESS RIGHT  08/07/2020  ? ?Social History:  reports that he has never smoked. He has never used smokeless tobacco. He reports that he does not currently use alcohol. He reports that he does not use drugs. ? ?Allergies  ?Allergen Reactions  ? Amlodipine Nausea And Vomiting and Other (See Comments)  ?  Patient was taking Amlodipine and Hydralazine at the same time, so the reactions came from one of the 2: Lethargy and an all-over feeling of NOT feeling well (also)  ? Hydralazine Nausea And Vomiting and Other (See Comments)  ?  Patient was taking Hydralazine AND Amlodipine at the same time, so the reactions came from one of the 2: Lethargy and an all-over feeling of NOT feeling well (also)  ? ? ?Family History  ?Problem Relation Age of Onset  ? Cancer Mother   ? Stroke Father   ? Diabetes Father   ? Hypertension Father   ? ? ?Prior to Admission medications   ?Medication Sig Start Date End Date Taking? Authorizing Provider  ?acetaminophen (TYLENOL) 325 MG tablet Take 2 tablets (650 mg total) by mouth every 4 (four) hours as needed for headache or mild pain. 03/05/21  Samella Parr, NP  ?atorvastatin (LIPITOR) 40 MG tablet TAKE 1 TABLET (40 MG TOTAL) BY MOUTH DAILY. ?Patient taking differently: Take 40 mg by mouth at bedtime. 09/06/20 09/06/21  Gildardo Pounds, NP  ?carvedilol (COREG) 25 MG tablet Take 1 tablet (25 mg total) by mouth 2 (two) times daily with a meal. 03/05/21   Samella Parr, NP  ?doxercalciferol ([name]) 4 MCG/2ML injection Inject 2.5 mLs (5 mcg total) into the vein every Monday, Wednesday, and Friday with  hemodialysis. 03/06/21   Samella Parr, NP  ?epoetin alfa-epbx (RETACRIT) 08657 UNIT/ML injection Inject 20,000 Units into the vein every Monday.    [provider]  ?imipramine (TOFRANIL) 25 MG tablet Take 1 tablet (25 mg total) by mouth at bedtime. 03/05/21   Samella Parr, NP  ?insulin glargine-yfgn (SEMGLEE) 100 UNIT/ML injection Inject 0.16 mLs (16 Units total) into the skin at bedtime. 03/05/21   Samella Parr, NP  ?insulin lispro (HUMALOG) 100 UNIT/ML injection Inject 1-7 Units into the skin See admin instructions. Per sliding scale 3 times daily with meals ?151-200= 1 unit ?201-250= 2 units ?251-300= 3 units ?301-350= 5 units ?351-400= 7 units ?Greater than 400 call md    [provider]  ?LANTUS SOLOSTAR 100 UNIT/ML Solostar Pen Inject into the skin. ?Patient not taking: Reported on 06/16/2021 04/18/21   [provider]  ?losartan (COZAAR) 50 MG tablet Take 1 tablet (50 mg total) by mouth at bedtime. 03/05/21   Samella Parr, NP  ?multivitamin (RENA-VIT) TABS tablet Take 1 tablet by mouth at bedtime. 11/27/20   Lavina Hamman, MD  ?ondansetron (ZOFRAN) 4 MG tablet Take 4 mg by mouth every 8 (eight) hours as needed. 04/12/21   [provider]  ?pantoprazole (PROTONIX) 40 MG tablet Take 1 tablet (40 mg total) by mouth daily. 03/18/21 04/17/21  Antonieta Pert, MD  ?sevelamer carbonate (RENVELA) 800 MG tablet Take 2 tablets (1,600 mg total) by mouth 3 (three) times daily with meals. 03/05/21   Samella Parr, NP  ? ? ?Physical Exam: ?Vitals:  ? 10/30/21 1055 10/30/21 1224 10/30/21 1300 10/30/21 1330  ?BP: (!) 178/119 (!) 174/104 (!) 169/112 (!) 172/102  ?Pulse: 99 (!) 105 97 93  ?Resp: (!) 21 (!) 26 19 (!) 22  ?Temp:      ?TempSrc:      ?SpO2: 100% 94% 96% 96%  ?Weight:      ?Height:      ? ?Physical Exam ?Vitals reviewed.  ?Constitutional:   ?   Interventions: Nasal cannula in place.  ?   Comments: Chronically ill-appearing.  ?HENT:  ?   Head: Normocephalic.  ?Eyes:  ?    General: No scleral icterus. ?   Pupils: Pupils are equal, round, and reactive to light.  ?Neck:  ?   Vascular: No JVD.  ?Cardiovascular:  ?   Rate and Rhythm: Normal rate and regular rhythm.  ?   Heart sounds: S1 normal and S2 normal.  ?Pulmonary:  ?   Effort: Pulmonary effort is normal.  ?Abdominal:  ?   General: There is no distension.  ?   Palpations: Abdomen is soft.  ?   Tenderness: There is no abdominal tenderness.  ?Musculoskeletal:  ?   Cervical back: Neck supple.  ?   Right lower leg: 1+ Edema present.  ?   Left lower leg: 1+ Edema present.  ?Skin: ?   General: Skin is warm and dry.  ?Neurological:  ?   Mental  Status: He is alert and oriented to person, place, and time.  ?Psychiatric:     ?   Mood and Affect: Mood normal.     ?   Behavior: Behavior normal.  ? ?Data Reviewed: ? ?There are no new results to review at this time. ? ?Assessment and Plan: ?Principal Problem: ?  Hyperkalemia ?In the setting of ?  ESRD (end stage renal disease) on dialysis [facility]) ?Observation/PCU. ?Already treated in ED. ?Lokelma 10 g p.o. x1. ?Hold angiotensin receptor blocker. ?Renal/carbohydrate modified diet ? ?Active Problems: ?  Acute respiratory failure with hypoxia ([place]) ?In the setting of: ?  Hypervolemia associated with renal insufficiency ?Continue supplemental oxygen. ?Will be treating with dialysis and oral fluid restriction. ? ?  Hypoglycemia ?Resolved after the patient ate a sandwich. ?Will change bed request back to telemetry. ? ?  DM2 (diabetes mellitus, type 2) ([place]) ?Carbohydrate modified diet. ?Continue Semglee 16 units at bedtime. ?CBG monitoring with RI SS. ?Check hemoglobin A1c. ? ?  Essential hypertension ?Holding losartan due to hyperkalemia. ?Hold beta-blocker due to volume overload. ? ?  Hypertrophic cardiomyopathy ([place]) ?Once not volume overloaded, ?Resume carvedilol 25 mg p.o. twice daily. ? ? ? Advance Care Planning: CODE STATUS: Full code. ? ?Consults: Nephrology Roney Jaffe, MD ? ?Family  Communication: ? ?Severity of Illness: ?The appropriate patient status for this patient is OBSERVATION. Observation status is judged to be reasonable and necessary in order to provide the required intensity of service to ensu

## 2021-10-30 NOTE — ED Notes (Addendum)
Pt was given a food regular tray with 471mLs of apple juice. With doctor approval. ?

## 2021-10-30 NOTE — ED Notes (Signed)
[facility] here to transport pt 

## 2021-10-30 NOTE — ED Notes (Signed)
Report to Izora Gala, RN at [facility] called for transport ?

## 2021-10-30 NOTE — ED Triage Notes (Signed)
PT to ER states he missed dialysis yesterday due to transportation issues.  Pt was unable to reschedule today and is c/o increasing SHOB and body aches. Pt placed on 2 L [place] due to desating during exam.   ?

## 2021-10-30 NOTE — ED Notes (Addendum)
Tray given to pt.

## 2021-10-31 DIAGNOSIS — E8770 Fluid overload, unspecified: Secondary | ICD-10-CM

## 2021-10-31 DIAGNOSIS — J9601 Acute respiratory failure with hypoxia: Secondary | ICD-10-CM

## 2021-10-31 DIAGNOSIS — N289 Disorder of kidney and ureter, unspecified: Secondary | ICD-10-CM

## 2021-10-31 DIAGNOSIS — E162 Hypoglycemia, unspecified: Secondary | ICD-10-CM

## 2021-10-31 DIAGNOSIS — Z992 Dependence on renal dialysis: Secondary | ICD-10-CM

## 2021-10-31 DIAGNOSIS — Z794 Long term (current) use of insulin: Secondary | ICD-10-CM

## 2021-10-31 DIAGNOSIS — I422 Other hypertrophic cardiomyopathy: Secondary | ICD-10-CM

## 2021-10-31 DIAGNOSIS — I10 Essential (primary) hypertension: Secondary | ICD-10-CM

## 2021-10-31 DIAGNOSIS — N186 End stage renal disease: Secondary | ICD-10-CM

## 2021-10-31 DIAGNOSIS — E875 Hyperkalemia: Secondary | ICD-10-CM | POA: Diagnosis not present

## 2021-10-31 DIAGNOSIS — E1122 Type 2 diabetes mellitus with diabetic chronic kidney disease: Secondary | ICD-10-CM

## 2021-10-31 LAB — COMPREHENSIVE METABOLIC PANEL
ALT: 22 U/L (ref 0–44)
AST: 12 U/L — ABNORMAL LOW (ref 15–41)
Albumin: 3.2 g/dL — ABNORMAL LOW (ref 3.5–5.0)
Alkaline Phosphatase: 66 U/L (ref 38–126)
Anion gap: 15 (ref 5–15)
BUN: 35 mg/dL — ABNORMAL HIGH (ref 6–20)
CO2: 26 mmol/L (ref 22–32)
Calcium: 8.2 mg/dL — ABNORMAL LOW (ref 8.9–10.3)
Chloride: 97 mmol/L — ABNORMAL LOW (ref 98–111)
Creatinine, Ser: 8.66 mg/dL — ABNORMAL HIGH (ref 0.61–1.24)
GFR, Estimated: 8 mL/min — ABNORMAL LOW (ref >60)
Glucose, Bld: 117 mg/dL — ABNORMAL HIGH (ref 70–99)
Potassium: 3.8 mmol/L (ref 3.5–5.1)
Sodium: 138 mmol/L (ref 135–145)
Total Bilirubin: 0.9 mg/dL (ref 0.3–1.2)
Total Protein: 7.2 g/dL (ref 6.5–8.1)

## 2021-10-31 LAB — CBC
HCT: 23.8 % — ABNORMAL LOW (ref 39.0–52.0)
Hemoglobin: 7.5 g/dL — ABNORMAL LOW (ref 13.0–17.0)
MCH: 27.3 pg (ref 26.0–34.0)
MCHC: 31.5 g/dL (ref 30.0–36.0)
MCV: 86.5 fL (ref 80.0–100.0)
Platelets: 214 10*3/uL (ref 150–400)
RBC: 2.75 MIL/uL — ABNORMAL LOW (ref 4.22–5.81)
RDW: 16.2 % — ABNORMAL HIGH (ref 11.5–15.5)
WBC: 10.3 10*3/uL (ref 4.0–10.5)
nRBC: 0 % (ref 0.0–0.2)

## 2021-10-31 LAB — HEMOGLOBIN A1C
Hgb A1c MFr Bld: 6.2 % — ABNORMAL HIGH (ref 4.8–5.6)
Mean Plasma Glucose: 131.24 mg/dL

## 2021-10-31 LAB — GLUCOSE, CAPILLARY
Glucose-Capillary: 128 mg/dL — ABNORMAL HIGH (ref 70–99)
Glucose-Capillary: 154 mg/dL — ABNORMAL HIGH (ref 70–99)

## 2021-10-31 LAB — HEPATITIS B SURFACE ANTIBODY, QUANTITATIVE: Hep B S AB Quant (Post): >1000 m[IU]/mL (ref >9.9)

## 2021-10-31 MED ORDER — CARVEDILOL 12.5 MG PO TABS
25.0000 mg | ORAL_TABLET | Freq: Two times a day (BID) | ORAL | Status: DC
  Administered 2021-10-31 (×2): 25 mg via ORAL
  Filled 2021-10-31 (×2): qty 2

## 2021-10-31 MED ORDER — COVID-19 MRNA VAC-TRIS(PFIZER) 30 MCG/0.3ML IM SUSP
0.3000 mL | Freq: Once | INTRAMUSCULAR | Status: AC
  Administered 2021-10-31: 0.3 mL via INTRAMUSCULAR
  Filled 2021-10-31: qty 0.3

## 2021-10-31 MED ORDER — LABETALOL HCL 5 MG/ML IV SOLN
10.0000 mg | INTRAVENOUS | Status: DC | PRN
Start: 2021-10-31 — End: 2021-10-31

## 2021-10-31 NOTE — Progress Notes (Signed)
?[facility] ?Progress Note  ? ?Subjective:   Patient seen and examined at bedside.  States he feels better today.  Would like to go home.  Denies CP, SOB, abdominal pain, n/v/d, weakness and fatigue.   ? ?Objective ?Vitals:  ? 10/31/21 1200 10/31/21 1230 10/31/21 1256 10/31/21 1300  ?BP: (!) 164/98 (!) 174/107 (!) 158/98 140/79  ?Pulse: 86 90 96 91  ?Resp:      ?Temp:      ?TempSrc:      ?SpO2:      ?Weight:      ?Height:      ? ?Physical Exam ?General:well appearing male in NAD ?Heart:RRR, no mrg ?Lungs:CTAB, nml WOB on RA ?Abdomen:soft, NTND ?Extremities:no LE edema ?Dialysis Access: LU AVF +b/t  ? ?Filed Weights  ? 10/31/21 0030 10/31/21 0530 10/31/21 1109  ?Weight: 85.8 kg 87.7 kg 88.1 kg  ? ? ?Intake/Output Summary (Last 24 hours) at 10/31/2021 1326 ?Last data filed at 10/31/2021 2585 ?Gross per 24 hour  ?Intake 540 ml  ?Output 3500 ml  ?Net -2960 ml  ? ? ?Additional Objective ?Labs: ?Basic Metabolic Panel: ?Recent Labs  ?Lab 10/30/21 ?1113 10/31/21 ?2778  ?NA 140 138  ?K 6.7* 3.8  ?CL 100 97*  ?CO2 22 26  ?GLUCOSE 111* 117*  ?BUN 77* 35*  ?CREATININE 14.83* 8.66*  ?CALCIUM 7.9* 8.2*  ? ?Liver Function Tests: ?Recent Labs  ?Lab 10/30/21 ?1113 10/31/21 ?2423  ?AST 13* 12*  ?ALT 24 22  ?ALKPHOS 71 66  ?BILITOT 1.0 0.9  ?PROT 8.0 7.2  ?ALBUMIN 3.8 3.2*  ? ? ?CBC: ?Recent Labs  ?Lab 10/30/21 ?1113 10/31/21 ?5361  ?WBC 10.3 10.3  ?NEUTROABS 8.9*  --   ?HGB 8.3* 7.5*  ?HCT 25.3* 23.8*  ?MCV 86.9 86.5  ?PLT 216 214  ? ?Blood Culture ?   ?Component Value Date/Time  ? SDES BLOOD BLOOD RIGHT FOREARM 11/24/2020 1955  ? SPECREQUEST  11/24/2020 1955  ?  BOTTLES DRAWN AEROBIC AND ANAEROBIC Blood Culture adequate volume  ? CULT  11/24/2020 1955  ?  NO GROWTH 6 DAYS ?Performed at [facility], [place] [address], [place], [place] [address] ?  ? REPTSTATUS 11/30/2020 FINAL 11/24/2020 1955  ? ?CBG: ?Recent Labs  ?Lab 10/30/21 ?1511 10/30/21 ?1556 10/30/21 ?1706 10/30/21 ?2214 10/31/21 ?0612  ?GLUCAP 30* 83 204*  122* 128*  ? ? ?Studies/Results: ?DG Chest 2 View ? ?Result Date: 10/30/2021 ?CLINICAL DATA:  Worsening shortness of breath and hypoxia. Missed dialysis yesterday. EXAM: CHEST - 2 VIEW COMPARISON:  03/23/2021 FINDINGS: Stable moderate cardiomegaly. Increased pulmonary vascular congestion and probable bilateral interstitial infiltrates, suspicious for mild interstitial edema. No evidence of pulmonary consolidation or pleural effusion. IMPRESSION: Cardiomegaly with probable mild interstitial pulmonary edema, suspicious for mild congestive heart failure. Electronically Signed   By: Marlaine Hind M.D.   On: 10/30/2021 11:00   ? ?Medications: ? ? atorvastatin  40 mg Oral QHS  ? carvedilol  25 mg Oral BID WC  ? heparin  5,000 Units Subcutaneous Q8H  ? insulin aspart  0-6 Units Subcutaneous TID WC  ? insulin glargine-yfgn  16 Units Subcutaneous QHS  ? pantoprazole  40 mg Oral Daily  ? sevelamer carbonate  1,600 mg Oral TID WC  ? ? ?Dialysis Orders: ?TTS [facility] ? - from sept 2022 > 4h  450/800  2/2 bath  LFA AVF ?  ?  Assessment/ Plan: ?Hyperkalemia - mod severe, K+ 6.7 on admit.  Improved today s/p HD last night.   ?  2. Pulmonary edema - noted on CXR.  SOB improved today. 3.5L removed last night with additional treatment planned for today. UF as tolerated.  ?3. ESRD - on HD TTS.  HD yesterday for Raliegh Ip & ^volume.  HD again today per regular schedule.  ?4.   DM2 - on insulin ?5.   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   ?6.   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 Phos not resulted.  Continue binders.  ?7.   HTN - cont home meds, improving with HD.  ?8.    HL ? ?Jen Mow, PA-C ?[facility] ?10/31/2021,1:26 PM ? LOS: 0 days  ? ? ?

## 2021-10-31 NOTE — Discharge Summary (Signed)
?Physician Discharge Summary ?  ?Patient: [name] MRN: [id number] DOB: 08/18/1985  ?Admit date:     10/30/2021  ?Discharge date: 11/01/21  ?Discharge Physician: Corrie Mckusick Terrace Chiem  ? ?PCP: Nolene Ebbs, MD  ? ?Recommendations at discharge:  ? ?Follow-up with your primary care physician in 1 week.   ?Continue hemodialysis outpatient. ? ?Discharge Diagnoses: ?Principal Problem: ?  Hyperkalemia ?Active Problems: ?  Essential hypertension ?  DM2 (diabetes mellitus, type 2) ([place]) ?  Hypervolemia associated with renal insufficiency ?  Hypertrophic cardiomyopathy ([place]) ?  Acute respiratory failure with hypoxia ([place]) ?  ESRD (end stage renal disease) on dialysis [facility]) ?  Hypoglycemia ? ?Resolved Problems: ?  * No resolved hospital problems. * ? ?Hospital Course: ?[name] is a 36 y.o. male with past medical history significant of type I DM, hypertrophic cardiomyopathy, hyperlipidemia, hypertension, ESRD on hemodialysis presented to hospital with generalized body ache, shortness of breath after missing 1 hemodialysis session.  In the ED he was mildly tachycardic with elevated blood pressure at 185/110.  Patient was noted to have hyperkalemia with potassium of 6.7 and creatinine of 14.8.  Anion gap was 18.  Chest x-ray showed cardiomegaly with pulmonary edema.  Patient was given temporizing measures for hyperkalemia and was admitted to the hospital.  Nephrology was consulted.   ? ?Assessment and Plan: ? ? Hyperkalemia ?Patient received temporizing measures in the ED.  EKG showed tall T waves consistent with hyperkalemia.  Also received Lokelma x1.  Nephrology was consulted.  Underwent hemodialysis as per nephrology.  Potassium  improved after hemodialysis.  Latest potassium of 3.8. ? ?  ESRD (end stage renal disease) on dialysis [facility]) ?Received hemodialysis urgently followed by hemodialysis again on 10/31/21 ? ?Hypoxia on presentation.  Due to fluid overload.  Improved with hemodialysis. ?  ?  Hypoglycemia ?Improved  at this time.  ? ?  DM2 (diabetes mellitus, type 2) ([place]) ?Carbohydrate modified diet.Continue Semglee 16 units at bedtime. Hemoglobin A1c of 6.2.  A1c was 12.9, 8 months back. ? ?  Essential hypertension ?On Coreg and losartan at home.  Will resume on discharge. ? ?  Hypertrophic cardiomyopathy ([place]) ?Continue Coreg losartan from home. ? ?Consultants: Nephrology ? ?Procedures performed: Hemodialysis ? ?Disposition: Home ?Diet recommendation:  ?Discharge Diet Orders (From admission, onward)  ? ?  Start     Ordered  ? 10/31/21 0000  Diet - low sodium heart healthy       ? 10/31/21 1127  ? ?  ?  ? ?  ? ?Cardiac diet ?DISCHARGE MEDICATION: ?Allergies as of 10/31/2021   ? ?   Reactions  ? Amlodipine Nausea And Vomiting, Other (See Comments)  ? Patient was taking Amlodipine and Hydralazine at the same time, so the reactions came from one of the 2: Lethargy and an all-over feeling of NOT feeling well (also)  ? Hydralazine Nausea And Vomiting, Other (See Comments)  ? Patient was taking Hydralazine AND Amlodipine at the same time, so the reactions came from one of the 2: Lethargy and an all-over feeling of NOT feeling well (also)  ? ?  ? ?  ?Medication List  ?  ? ?TAKE these medications   ? ?acetaminophen 325 MG tablet ?Commonly known as: TYLENOL ?Take 2 tablets (650 mg total) by mouth every 4 (four) hours as needed for headache or mild pain. ?  ?atorvastatin 40 MG tablet ?Commonly known as: LIPITOR ?TAKE 1 TABLET (40 MG TOTAL) BY MOUTH DAILY. ?  ?carvedilol 25 MG tablet ?  Commonly known as: COREG ?Take 1 tablet (25 mg total) by mouth 2 (two) times daily with a meal. ?  ?doxercalciferol 4 MCG/2ML injection ?Commonly known as: [name] ?Inject 2.5 mLs (5 mcg total) into the vein every Monday, Wednesday, and Friday with hemodialysis. ?  ?imipramine 25 MG tablet ?Commonly known as: TOFRANIL ?Take 1 tablet (25 mg total) by mouth at bedtime. ?  ?insulin glargine-yfgn 100 UNIT/ML injection ?Commonly known as: SEMGLEE ?Inject 0.16  mLs (16 Units total) into the skin at bedtime. ?What changed:  ?how much to take ?when to take this ?  ?insulin lispro 100 UNIT/ML injection ?Commonly known as: HUMALOG ?Inject 1-7 Units into the skin See admin instructions. Per sliding scale 3 times daily with meals ?151-200= 1 unit ?201-250= 2 units ?251-300= 3 units ?301-350= 5 units ?351-400= 7 units ?Greater than 400 call md takes only as needed if blood sugar is over 200 ?  ?losartan 50 MG tablet ?Commonly known as: COZAAR ?Take 1 tablet (50 mg total) by mouth at bedtime. ?  ?multivitamin Tabs tablet ?Take 1 tablet by mouth at bedtime. ?  ?ondansetron 4 MG tablet ?Commonly known as: ZOFRAN ?Take 4 mg by mouth every 8 (eight) hours as needed for nausea or vomiting. ?  ?pantoprazole 40 MG tablet ?Commonly known as: Protonix ?Take 1 tablet (40 mg total) by mouth daily. ?  ?prednisoLONE acetate 1 % ophthalmic suspension ?Commonly known as: PRED FORTE ?Place 1 drop into the left eye See admin instructions. 2-3 times daily ?  ?sevelamer carbonate 800 MG tablet ?Commonly known as: RENVELA ?Take 2 tablets (1,600 mg total) by mouth 3 (three) times daily with meals. ?  ?silver sulfADIAZINE 1 % cream ?Commonly known as: SILVADENE ?Apply 1 application. topically 2 (two) times daily as needed (breakout). ?  ? ?  ? ?subjective ?Today, patient was seen and examined at bedside.  Feels okay.  Denies any shortness of breath, dizziness, lightheadedness, nausea vomiting ? ?Discharge Exam: ?Filed Weights  ? 10/31/21 0530 10/31/21 1109 10/31/21 1459  ?Weight: 87.7 kg 88.1 kg 85.8 kg  ? ? ?  10/31/2021  ?  4:19 PM 10/31/2021  ?  2:59 PM 10/31/2021  ?  2:30 PM  ?Vitals with BMI  ?Weight  189 lbs 2 oz   ?BMI  25.65   ?Systolic 233 007 622  ?Diastolic 84 70 68  ?Pulse 77 78 77  ?  ?General:  Average built, not in obvious distress ?HENT:   No scleral pallor or icterus noted. Oral mucosa is moist.  ?Chest:    Diminished breath sounds bilaterally. No crackles or wheezes.  ?CVS: S1 &S2 heard.  No murmur.  Regular rate and rhythm. ?Abdomen: Soft, nontender, nondistended.  Bowel sounds are heard.   ?Extremities: No cyanosis, clubbing or edema.  Peripheral pulses are palpable.  AV fistula with bruit on the left upper extremity ?Psych: Alert, awake and oriented, normal mood ?CNS:  No cranial nerve deficits.  Power equal in all extremities.   ?Skin: Warm and dry.  No rashes noted. ? ?Condition at discharge: good ? ?The results of significant diagnostics from this hospitalization (including imaging, microbiology, ancillary and laboratory) are listed below for reference.  ? ?Imaging Studies: ?DG Chest 2 View ? ?Result Date: 10/30/2021 ?CLINICAL DATA:  Worsening shortness of breath and hypoxia. Missed dialysis yesterday. EXAM: CHEST - 2 VIEW COMPARISON:  03/23/2021 FINDINGS: Stable moderate cardiomegaly. Increased pulmonary vascular congestion and probable bilateral interstitial infiltrates, suspicious for mild interstitial edema. No evidence of pulmonary  consolidation or pleural effusion. IMPRESSION: Cardiomegaly with probable mild interstitial pulmonary edema, suspicious for mild congestive heart failure. Electronically Signed   By: Marlaine Hind M.D.   On: 10/30/2021 11:00   ? ?Microbiology: ?Results for orders placed or performed during the hospital encounter of 03/23/21  ?Resp Panel by RT-PCR (Flu A&B, Covid) Nasopharyngeal Swab     Status: None  ? Collection Time: 03/23/21  5:30 AM  ? Specimen: Nasopharyngeal Swab; Nasopharyngeal(NP) swabs in vial transport medium  ?Result Value Ref Range Status  ? SARS Coronavirus 2 by RT PCR NEGATIVE NEGATIVE Final  ?  Comment: (NOTE) ?SARS-CoV-2 target nucleic acids are NOT DETECTED. ? ?The SARS-CoV-2 RNA is generally detectable in upper respiratory ?specimens during the acute phase of infection. The lowest ?concentration of SARS-CoV-2 viral copies this assay can detect is ?138 copies/mL. A negative result does not preclude SARS-Cov-2 ?infection and should not be used as  the sole basis for treatment or ?other patient management decisions. A negative result may occur with  ?improper specimen collection/handling, submission of specimen other ?than nasopharyngeal swab, p

## 2021-10-31 NOTE — Hospital Course (Addendum)
[name] is a 36 y.o. male with past medical history significant of type I DM, hypertrophic cardiomyopathy, hyperlipidemia, hypertension, ESRD on hemodialysis presented to hospital with generalized body ache, shortness of breath after missing 1 hemodialysis session.  In the ED he was mildly tachycardic with elevated blood pressure at 185/110.  Patient was noted to have hyperkalemia with potassium of 6.7 and creatinine of 14.8.  Anion gap was 18.  Chest x-ray showed cardiomegaly with pulmonary edema.  Patient was given temporizing measures for hyperkalemia and was admitted to the hospital.  Nephrology was consulted.   ? ?Assessment and Plan: ? ? Hyperkalemia ?Patient received temporizing measures in the ED.  EKG showed tall T waves consistent with hyperkalemia.  Also received Lokelma x1.  Nephrology was consulted.  Underwent hemodialysis as per nephrology.  Potassium  improved after hemodialysis.  Latest potassium of 3.8. ? ?  ESRD (end stage renal disease) on dialysis [facility]) ?Received hemodialysis urgently followed by hemodialysis again on 10/31/21 ? ?Hypoxia on presentation.  Due to fluid overload.  Improved with hemodialysis. ?  ?  Hypoglycemia ?Improved at this time.  ? ?  DM2 (diabetes mellitus, type 2) ([place]) ?Carbohydrate modified diet.Continue Semglee 16 units at bedtime. Hemoglobin A1c of 6.2.  A1c was 12.9, 8 months back. ? ?  Essential hypertension ?On Coreg and losartan at home.  Will resume on discharge. ? ?  Hypertrophic cardiomyopathy ([place]) ?Continue Coreg losartan from home. ?

## 2021-10-31 NOTE — Progress Notes (Signed)
Pt. Discharge teaching completed.  Patient had no reaction to covid vaccine and his AVS and Covid Vaccine information packet given to him. ?

## 2021-10-31 NOTE — Progress Notes (Addendum)
TRH night cross cover note: ? ?I was notified by RN of patient's current blood pressure in the 170s over low 100s mmHg following this evening's hemodialysis session which 3.5 L were removed. I subsequently placed order for prn IV labetalol for systolic blood pressure greater than 833 or diastolic blood pressure greater than 100 mmHg. Of note HR's in the 80's - 90's.  ? ? ? ? ?Babs Bertin, DO ?Hospitalist ? ?

## 2021-10-31 NOTE — Progress Notes (Signed)
removed 164mls net fuid.  unable to remove more due to symptomatic hypotension.  pre bp 143/67 post bp 109/61 pre weight 88.1kg post weight 86.0kg bed scales.  2 bandages to lua avf no bleeding dressing cdi. ?

## 2021-10-31 NOTE — Progress Notes (Signed)
TRH night cross cover note: ? ?I was notified by RN of patient's evening blood sugar 120, requesting clarification on administration of short acting coverage in the setting of previously noted hypoglycemia with initial blood sugars in the 30s, with patient also noted to not eat much for dinner this evening.  Consequently, will hold current dose of short-acting insulin, and continue to closely monitor ensuing blood sugar via existing CBG checks.  ? ? ? ?Babs Bertin, DO ?Hospitalist ? ?

## 2021-11-03 ENCOUNTER — Ambulatory Visit: Payer: Medicaid Other | Attending: Internal Medicine

## 2021-11-12 ENCOUNTER — Telehealth: Payer: Self-pay

## 2021-11-12 NOTE — Telephone Encounter (Signed)
Ramiro Harvest, PA with [facility] called stating pt has a thinning area on his LUE AVF. He states the patient hasn't had any additional sxs just the appearance. Patient does HD on T-Th-Sat so needs an appt on M-W-F. Pt scheduled. Juanda Crumble states he will give pt appt details.  ?

## 2021-11-16 ENCOUNTER — Ambulatory Visit: Payer: Medicaid Other | Admitting: Podiatry

## 2021-11-16 NOTE — H&P (View-Only) (Signed)
HISTORY AND PHYSICAL     CC:  dialysis access Requesting Provider:  Nolene Ebbs, MD  HPI: This is a 36 y.o. male here for evaluation of his hemodialysis access.  Pt has hx of left upper extremity RC AVF 08/11/2020.  Pt was last seen 10/28/2020 and at that time, his wounds were healed and he was doing well without steal sx.    He comes in today at the request of Ramiro Harvest for evaluation of his fistula. Pt has vision impairment.  He states he is told that he has thinning skin over the fistula.  Pt denies any hx of significant bleeding.  He states that he did take the bandages off about a week ago and there was some bleeding but since he is vision impaired, he could not tell how much and call EMS.   He states the bleeding was minimal.  He states the fistula has been working well.  He has not had any procedures on his fistula.    Dialysis access history: -LUE RC AVF 08/11/2020 Dr. Stanford Breed   The pt is right hand dominant.    Pt is on dialysis.   Days of dialysis if applicable:  T/T/S HD center if applicable:  [facility] in [place] location.   The pt is on a statin for cholesterol management.  The pt is not on a daily aspirin.  Other AC:  none The pt is on ARB, BB for hypertension.  The pt does is diabetic.   Tobacco hx:  never  Past Medical History:  Diagnosis Date   Diabetes mellitus    ESRD on hemodialysis ([place])    High cholesterol    Hypertension    Hypertrophic cardiomyopathy ([place]) 02/26/2021    Past Surgical History:  Procedure Laterality Date   AV FISTULA PLACEMENT Left 08/11/2020   Procedure: LEFT UPPER EXTREMITY ARTERIOVENOUS (AV) FISTULA CREATION;  Surgeon: Cherre Robins, MD;  Location: [place];  Service: Vascular;  Laterality: Left;   FRACTURE SURGERY     I & D EXTREMITY Left 07/16/2014   Procedure: IRRIGATION AND DEBRIDEMENT EXTREMITY/LEFT INDEX FINGER;  Surgeon: Leanora Cover, MD;  Location: [place];  Service: Orthopedics;  Laterality: Left;   IR PERC TUN PERIT CATH WO  PORT S&I /IMAG  08/07/2020   IR US GUIDE VASC ACCESS RIGHT  08/07/2020    Allergies  Allergen Reactions   Amlodipine Nausea And Vomiting and Other (See Comments)    Patient was taking Amlodipine and Hydralazine at the same time, so the reactions came from one of the 2: Lethargy and an all-over feeling of NOT feeling well (also)   Hydralazine Nausea And Vomiting and Other (See Comments)    Patient was taking Hydralazine AND Amlodipine at the same time, so the reactions came from one of the 2: Lethargy and an all-over feeling of NOT feeling well (also)    Current Outpatient Medications  Medication Sig Dispense Refill   acetaminophen (TYLENOL) 325 MG tablet Take 2 tablets (650 mg total) by mouth every 4 (four) hours as needed for headache or mild pain.     atorvastatin (LIPITOR) 40 MG tablet TAKE 1 TABLET (40 MG TOTAL) BY MOUTH DAILY. (Patient taking differently: Take 40 mg by mouth daily.) 90 tablet 3   carvedilol (COREG) 25 MG tablet Take 1 tablet (25 mg total) by mouth 2 (two) times daily with a meal.     doxercalciferol ([name]) 4 MCG/2ML injection Inject 2.5 mLs (5 mcg total) into the vein every Monday, Wednesday,  and Friday with hemodialysis. (Patient not taking: Reported on 10/30/2021) 2 mL    imipramine (TOFRANIL) 25 MG tablet Take 1 tablet (25 mg total) by mouth at bedtime. (Patient not taking: Reported on 10/30/2021)  3   insulin glargine-yfgn (SEMGLEE) 100 UNIT/ML injection Inject 0.16 mLs (16 Units total) into the skin at bedtime. (Patient taking differently: Inject 22 Units into the skin every evening.) 10 mL 11   insulin lispro (HUMALOG) 100 UNIT/ML injection Inject 1-7 Units into the skin See admin instructions. Per sliding scale 3 times daily with meals 151-200= 1 unit 201-250= 2 units 251-300= 3 units 301-350= 5 units 351-400= 7 units Greater than 400 call md takes only as needed if blood sugar is over 200     losartan (COZAAR) 50 MG tablet Take 1 tablet (50 mg total) by mouth  at bedtime. (Patient not taking: Reported on 10/30/2021)     multivitamin (RENA-VIT) TABS tablet Take 1 tablet by mouth at bedtime. (Patient not taking: Reported on 10/30/2021) 30 tablet 0   ondansetron (ZOFRAN) 4 MG tablet Take 4 mg by mouth every 8 (eight) hours as needed for nausea or vomiting.     pantoprazole (PROTONIX) 40 MG tablet Take 1 tablet (40 mg total) by mouth daily. (Patient not taking: Reported on 10/30/2021) 30 tablet 0   prednisoLONE acetate (PRED FORTE) 1 % ophthalmic suspension Place 1 drop into the left eye See admin instructions. 2-3 times daily     sevelamer carbonate (RENVELA) 800 MG tablet Take 2 tablets (1,600 mg total) by mouth 3 (three) times daily with meals.     silver sulfADIAZINE (SILVADENE) 1 % cream Apply 1 application. topically 2 (two) times daily as needed (breakout).     No current facility-administered medications for this visit.    Family History  Problem Relation Age of Onset   Cancer Mother    Stroke Father    Diabetes Father    Hypertension Father     Social History   Socioeconomic History   Marital status: Single    Spouse name: Not on file   Number of children: Not on file   Years of education: Not on file   Highest education level: Not on file  Occupational History   Occupation: disabled  Tobacco Use   Smoking status: Never   Smokeless tobacco: Never  Vaping Use   Vaping Use: Never used  Substance and Sexual Activity   Alcohol use: Not Currently    Comment: social drinker   Drug use: No   Sexual activity: Yes  Other Topics Concern   Not on file  Social History Narrative   Not on file   Social Determinants of Health   Financial Resource Strain: Not on file  Food Insecurity: Not on file  Transportation Needs: Not on file  Physical Activity: Not on file  Stress: Not on file  Social Connections: Not on file  Intimate Partner Violence: Not on file     ROS: [x]  Positive   [ ]  Negative   [ ]  All sytems reviewed and are  negative  Cardiac: []  chest pain/pressure []  SOB/DOE  Vascular: []  pain in legs while walking []  pain in feet when lying flat []  swelling in legs  Pulmonary: []  asthma []  wheezing  Neurologic: []  hx CVA/TIA  Hematologic: []  bleeding problems  GI []  GERD  GU: [x]  CKD/renal failure  [x]  HD---[]  M/W/F [x]  T/T/S  Psychiatric: []  hx of major depression  Integumentary: []  rashes []  ulcers  Constitutional: []   fever []  chills   PHYSICAL EXAMINATION:  Today's Vitals   11/23/21 1233  BP: (!) 153/97  Pulse: 100  Resp: 20  Temp: 98.6 F (37 C)  TempSrc: Temporal  SpO2: (!) 72%  Weight: 195 lb 14.4 oz (88.9 kg)  Height: 6' (1.829 m)  PainSc: 2    Body mass index is 26.57 kg/m.    General:  WDWN male in NAD Gait: Not observed HENT: WNL Pulmonary: normal non-labored breathing  Cardiac: regular, without carotid bruits Abdomen: soft, NT, no masses Skin: without rashes Vascular Exam/Pulses:  Bilateral radial artery pulses are palpable Extremities:  pt with aneurysmal area over the fistula.  There is an excellent thrill over fistula   Musculoskeletal: no muscle wasting or atrophy  Neurologic: A&O X 3; Speech is fluent/normal    ASSESSMENT/PLAN: 36 y.o. male with ESRD here for evaluation of his hemodialysis access with hx  of left upper extremity RC AVF 08/11/2020.     -pt is on dialysis T/T/S.  He does have an area of thinning skin over his fistula.  Will schedule him for a plication of the fistula.  He may have room proximal to the area to stick for HD to avoid [facility] placement but this will be determined at time of surgery.  Pt expresses understanding -discussed with pt that access does not last forever and will need intervention or even new access at some point.  -pt is right hand dominant -pt is not on anticoagulation   Leontine Locket, [facility] [phone]  Clinic MD:   Carlis Abbott

## 2021-11-16 NOTE — Progress Notes (Signed)
?HISTORY AND PHYSICAL  ? ? ? ?CC:  dialysis access ?Requesting Provider:  Nolene Ebbs, MD ? ?HPI: This is a 36 y.o. male here for evaluation of his hemodialysis access.  Pt has hx of left upper extremity RC AVF 08/11/2020.  Pt was last seen 10/28/2020 and at that time, his wounds were healed and he was doing well without steal sx.   ? ?He comes in today at the request of Ramiro Harvest for evaluation of his fistula. Pt has vision impairment.  He states he is told that he has thinning skin over the fistula.  Pt denies any hx of significant bleeding.  He states that he did take the bandages off about a week ago and there was some bleeding but since he is vision impaired, he could not tell how much and call EMS.   He states the bleeding was minimal.  He states the fistula has been working well.  He has not had any procedures on his fistula.   ? ?Dialysis access history: ?-LUE RC AVF 08/11/2020 Dr. Stanford Breed ? ? ?The pt is right hand dominant.    ?Pt is on dialysis.   ?Days of dialysis if applicable:  T/T/S ?HD center if applicable:  [facility] in [place] location. ? ? ?The pt is on a statin for cholesterol management.  ?The pt is not on a daily aspirin.  Other AC:  none ?The pt is on ARB, BB for hypertension.  ?The pt does is diabetic.   ?Tobacco hx:  never ? ?Past Medical History:  ?Diagnosis Date  ? Diabetes mellitus   ? ESRD on hemodialysis ([place])   ? High cholesterol   ? Hypertension   ? Hypertrophic cardiomyopathy ([place]) 02/26/2021  ? ? ?Past Surgical History:  ?Procedure Laterality Date  ? AV FISTULA PLACEMENT Left 08/11/2020  ? Procedure: LEFT UPPER EXTREMITY ARTERIOVENOUS (AV) FISTULA CREATION;  Surgeon: Cherre Robins, MD;  Location: [facility];  Service: Vascular;  Laterality: Left;  ? FRACTURE SURGERY    ? I & D EXTREMITY Left 07/16/2014  ? Procedure: IRRIGATION AND DEBRIDEMENT EXTREMITY/LEFT INDEX FINGER;  Surgeon: Leanora Cover, MD;  Location: [facility];  Service: Orthopedics;  Laterality: Left;  ? IR PERC TUN PERIT CATH WO  PORT S&I /IMAG  08/07/2020  ? IR US GUIDE VASC ACCESS RIGHT  08/07/2020  ? ? ?Allergies  ?Allergen Reactions  ? Amlodipine Nausea And Vomiting and Other (See Comments)  ?  Patient was taking Amlodipine and Hydralazine at the same time, so the reactions came from one of the 2: Lethargy and an all-over feeling of NOT feeling well (also)  ? Hydralazine Nausea And Vomiting and Other (See Comments)  ?  Patient was taking Hydralazine AND Amlodipine at the same time, so the reactions came from one of the 2: Lethargy and an all-over feeling of NOT feeling well (also)  ? ? ?Current Outpatient Medications  ?Medication Sig Dispense Refill  ? acetaminophen (TYLENOL) 325 MG tablet Take 2 tablets (650 mg total) by mouth every 4 (four) hours as needed for headache or mild pain.    ? atorvastatin (LIPITOR) 40 MG tablet TAKE 1 TABLET (40 MG TOTAL) BY MOUTH DAILY. (Patient taking differently: Take 40 mg by mouth daily.) 90 tablet 3  ? carvedilol (COREG) 25 MG tablet Take 1 tablet (25 mg total) by mouth 2 (two) times daily with a meal.    ? doxercalciferol ([name]) 4 MCG/2ML injection Inject 2.5 mLs (5 mcg total) into the vein every Monday, Wednesday,  and Friday with hemodialysis. (Patient not taking: Reported on 10/30/2021) 2 mL   ? imipramine (TOFRANIL) 25 MG tablet Take 1 tablet (25 mg total) by mouth at bedtime. (Patient not taking: Reported on 10/30/2021)  3  ? insulin glargine-yfgn (SEMGLEE) 100 UNIT/ML injection Inject 0.16 mLs (16 Units total) into the skin at bedtime. (Patient taking differently: Inject 22 Units into the skin every evening.) 10 mL 11  ? insulin lispro (HUMALOG) 100 UNIT/ML injection Inject 1-7 Units into the skin See admin instructions. Per sliding scale 3 times daily with meals ?151-200= 1 unit ?201-250= 2 units ?251-300= 3 units ?301-350= 5 units ?351-400= 7 units ?Greater than 400 call md takes only as needed if blood sugar is over 200    ? losartan (COZAAR) 50 MG tablet Take 1 tablet (50 mg total) by mouth  at bedtime. (Patient not taking: Reported on 10/30/2021)    ? multivitamin (RENA-VIT) TABS tablet Take 1 tablet by mouth at bedtime. (Patient not taking: Reported on 10/30/2021) 30 tablet 0  ? ondansetron (ZOFRAN) 4 MG tablet Take 4 mg by mouth every 8 (eight) hours as needed for nausea or vomiting.    ? pantoprazole (PROTONIX) 40 MG tablet Take 1 tablet (40 mg total) by mouth daily. (Patient not taking: Reported on 10/30/2021) 30 tablet 0  ? prednisoLONE acetate (PRED FORTE) 1 % ophthalmic suspension Place 1 drop into the left eye See admin instructions. 2-3 times daily    ? sevelamer carbonate (RENVELA) 800 MG tablet Take 2 tablets (1,600 mg total) by mouth 3 (three) times daily with meals.    ? silver sulfADIAZINE (SILVADENE) 1 % cream Apply 1 application. topically 2 (two) times daily as needed (breakout).    ? ?No current facility-administered medications for this visit.  ? ? ?Family History  ?Problem Relation Age of Onset  ? Cancer Mother   ? Stroke Father   ? Diabetes Father   ? Hypertension Father   ? ? ?Social History  ? ?Socioeconomic History  ? Marital status: Single  ?  Spouse name: Not on file  ? Number of children: Not on file  ? Years of education: Not on file  ? Highest education level: Not on file  ?Occupational History  ? Occupation: disabled  ?Tobacco Use  ? Smoking status: Never  ? Smokeless tobacco: Never  ?Vaping Use  ? Vaping Use: Never used  ?Substance and Sexual Activity  ? Alcohol use: Not Currently  ?  Comment: social drinker  ? Drug use: No  ? Sexual activity: Yes  ?Other Topics Concern  ? Not on file  ?Social History Narrative  ? Not on file  ? ?Social Determinants of Health  ? ?Financial Resource Strain: Not on file  ?Food Insecurity: Not on file  ?Transportation Needs: Not on file  ?Physical Activity: Not on file  ?Stress: Not on file  ?Social Connections: Not on file  ?Intimate Partner Violence: Not on file  ? ? ? ?ROS: [x]  Positive   [ ]  Negative   [ ]  All sytems reviewed and are  negative ? ?Cardiac: ?[]  chest pain/pressure ?[]  SOB/DOE ? ?Vascular: ?[]  pain in legs while walking ?[]  pain in feet when lying flat ?[]  swelling in legs ? ?Pulmonary: ?[]  asthma ?[]  wheezing ? ?Neurologic: ?[]  hx CVA/TIA ? ?Hematologic: ?[]  bleeding problems ? ?GI ?[]  GERD ? ?GU: ?[x]  CKD/renal failure  [x]  HD---[]  M/W/F [x]  T/T/S ? ?Psychiatric: ?[]  hx of major depression ? ?Integumentary: ?[]  rashes []  ulcers ? ?Constitutional: ?[]   fever []  chills ? ? ?PHYSICAL EXAMINATION: ? ?Today's Vitals  ? 11/23/21 1233  ?BP: (!) 153/97  ?Pulse: 100  ?Resp: 20  ?Temp: 98.6 ?F (37 ?C)  ?TempSrc: Temporal  ?SpO2: (!) 72%  ?Weight: 195 lb 14.4 oz (88.9 kg)  ?Height: 6' (1.829 m)  ?PainSc: 2   ? ?Body mass index is 26.57 kg/m?. ? ? ? ?General:  WDWN male in NAD ?Gait: Not observed ?HENT: WNL ?Pulmonary: normal non-labored breathing  ?Cardiac: regular, without carotid bruits ?Abdomen: soft, NT, no masses ?Skin: without rashes ?Vascular Exam/Pulses:  ?Bilateral radial artery pulses are palpable ?Extremities:  pt with aneurysmal area over the fistula.  There is an excellent thrill over fistula ? ? ?Musculoskeletal: no muscle wasting or atrophy  ?Neurologic: A&O X 3; Speech is fluent/normal ? ? ? ?ASSESSMENT/PLAN: 36 y.o. male with ESRD here for evaluation of his hemodialysis access with hx  of left upper extremity RC AVF 08/11/2020.   ? ? ?-pt is on dialysis T/T/S.  He does have an area of thinning skin over his fistula.  Will schedule him for a plication of the fistula.  He may have room proximal to the area to stick for HD to avoid [facility] placement but this will be determined at time of surgery.  Pt expresses understanding ?-discussed with pt that access does not last forever and will need intervention or even new access at some point.  ?-pt is right hand dominant ?-pt is not on anticoagulation ? ? ?Leontine Locket, PAC ?[facility] ?[phone] ? ?Clinic MD:   Carlis Abbott ?

## 2021-11-23 ENCOUNTER — Other Ambulatory Visit: Payer: Self-pay

## 2021-11-23 ENCOUNTER — Ambulatory Visit (INDEPENDENT_AMBULATORY_CARE_PROVIDER_SITE_OTHER): Payer: Medicaid Other | Admitting: Physician Assistant

## 2021-11-23 VITALS — BP 153/97 | HR 100 | Temp 98.6°F | Resp 20 | Ht 72.0 in | Wt 195.9 lb

## 2021-11-23 DIAGNOSIS — Z992 Dependence on renal dialysis: Secondary | ICD-10-CM

## 2021-11-23 DIAGNOSIS — N186 End stage renal disease: Secondary | ICD-10-CM | POA: Diagnosis not present

## 2021-11-30 ENCOUNTER — Other Ambulatory Visit: Payer: Self-pay

## 2021-11-30 ENCOUNTER — Ambulatory Visit (INDEPENDENT_AMBULATORY_CARE_PROVIDER_SITE_OTHER): Payer: Medicaid Other

## 2021-11-30 ENCOUNTER — Encounter (HOSPITAL_COMMUNITY): Payer: Self-pay | Admitting: Vascular Surgery

## 2021-11-30 ENCOUNTER — Ambulatory Visit (INDEPENDENT_AMBULATORY_CARE_PROVIDER_SITE_OTHER): Payer: Medicaid Other | Admitting: Podiatry

## 2021-11-30 ENCOUNTER — Encounter: Payer: Self-pay | Admitting: Podiatry

## 2021-11-30 DIAGNOSIS — L03031 Cellulitis of right toe: Secondary | ICD-10-CM

## 2021-11-30 DIAGNOSIS — B353 Tinea pedis: Secondary | ICD-10-CM

## 2021-11-30 DIAGNOSIS — L97512 Non-pressure chronic ulcer of other part of right foot with fat layer exposed: Secondary | ICD-10-CM | POA: Diagnosis not present

## 2021-11-30 DIAGNOSIS — L02611 Cutaneous abscess of right foot: Secondary | ICD-10-CM | POA: Diagnosis not present

## 2021-11-30 MED ORDER — KETOCONAZOLE 2 % EX CREA
1.0000 "application " | TOPICAL_CREAM | Freq: Every day | CUTANEOUS | 2 refills | Status: DC
  Filled 2021-11-30: qty 60, 30d supply, fill #0

## 2021-11-30 MED ORDER — DOXYCYCLINE HYCLATE 100 MG PO TABS
100.0000 mg | ORAL_TABLET | Freq: Two times a day (BID) | ORAL | 0 refills | Status: DC
  Filled 2021-11-30: qty 20, 10d supply, fill #0

## 2021-11-30 NOTE — Patient Instructions (Signed)
In the mornings after bathing, dry well between the toes and apply betadine liquid (also known as povidone-iodine solution) between the toes. In the evening, wash the feet and dry well between the toes and apply a small amount of the ketoconazole cream

## 2021-11-30 NOTE — Progress Notes (Addendum)
Mr. Stewart denies chest pain or shortness of breath. Patient denies having any s/s of Covid in his household.  Patient denies any known exposure to Covid.   Mr. Huelsmann PCP is Dr. Cheryln Manly. Cardiologist is Dr. Marlou Porch.  Mr. Blahut is possibly type I diabetic, he is not sure., I read in the chart that he is a type 1.  Mr. Hilman checks CBG 1 time a week, he reports that it runs around 100. I instructed Mr. Kasler to take 17 units of Seglee Insulin on Tuesday evening.  I instructed patient to check CBG after awaking and every 2 hours until arrival  to the hospital. I Instructed patient if CBG is less than 70 to take 4 Glucose Tablets or 1 tube of Glucose Gel or 1/2 cup of a clear juice. Recheck CBG in 15 minutes if CBG is not over 70 call, pre- op desk at [phone] for further instructions. If CBG is greater than 220 call the same number and ask if he should take Novolog R insulin.

## 2021-12-01 ENCOUNTER — Other Ambulatory Visit: Payer: Self-pay

## 2021-12-01 NOTE — Progress Notes (Signed)
  Subjective:  Patient ID: [name], male    DOB: 01/15/1986,  MRN: [id number]  Chief Complaint  Patient presents with   Diabetic Ulcer    (New Patient) Diagnosis: Ulcer of foot Referring Provider: Lavonia Dana    36 y.o. male presents with the above complaint. History confirmed with patient.  He is on dialysis and has type 2 diabetes.  His A1c is doing better now it was over 10% at 1 point but is now 6.9%.  He feels embarrassed about his feet.  Objective:  Physical Exam: warm, good capillary refill, no trophic changes or ulcerative lesions, normal DP and PT pulses, and absent protective sensation he has tinea pedis and ulceration and skin breakdown in the first and fourth interspace, the fourth interspace is more severe, minor erythema and drainage here.   Radiographs: Multiple views x-ray of the right foot: no soft tissue emphysema, no signs of osteomyelitis Assessment:   1. Skin ulcer of right foot including toes with fat layer exposed ([place])   2. Tinea pedis of right foot   3. Cellulitis and abscess of toe of right foot      Plan:  Patient was evaluated and treated and all questions answered.  Appears to have superimposed bacterial infection ulceration with tinea pedis.  I prescribed him doxycycline and ketoconazole advised him to place Betadine paint between the toes in the morning and the ketoconazole cream in a thin layer between the toes at night.  Give toes as much air as possible.  Discussed spraying shoe gear with antifungal spray.  I will reevaluate him in 3 weeks to see how he is doing.  Return in about 3 weeks (around 12/21/2021) for wound care.

## 2021-12-02 ENCOUNTER — Encounter (HOSPITAL_COMMUNITY)
Admission: RE | Disposition: A | Discharge status: Home / Self Care | Payer: Self-pay | Source: Home / Self Care | Attending: Vascular Surgery

## 2021-12-02 ENCOUNTER — Ambulatory Visit (HOSPITAL_BASED_OUTPATIENT_CLINIC_OR_DEPARTMENT_OTHER): Payer: Medicaid Other | Admitting: Physician Assistant

## 2021-12-02 ENCOUNTER — Ambulatory Visit (HOSPITAL_COMMUNITY)
Admission: RE | Admit: 2021-12-02 | Discharge: 2021-12-02 | Disposition: A | Discharge status: Home / Self Care | Payer: Medicaid Other | Attending: Vascular Surgery | Admitting: Vascular Surgery

## 2021-12-02 ENCOUNTER — Other Ambulatory Visit (HOSPITAL_COMMUNITY): Payer: Self-pay

## 2021-12-02 ENCOUNTER — Ambulatory Visit (HOSPITAL_COMMUNITY): Payer: Medicaid Other

## 2021-12-02 ENCOUNTER — Encounter (HOSPITAL_COMMUNITY): Payer: Self-pay | Admitting: Vascular Surgery

## 2021-12-02 ENCOUNTER — Other Ambulatory Visit: Payer: Self-pay

## 2021-12-02 ENCOUNTER — Ambulatory Visit (HOSPITAL_COMMUNITY): Payer: Medicaid Other | Admitting: Physician Assistant

## 2021-12-02 DIAGNOSIS — N186 End stage renal disease: Secondary | ICD-10-CM

## 2021-12-02 DIAGNOSIS — E1122 Type 2 diabetes mellitus with diabetic chronic kidney disease: Secondary | ICD-10-CM | POA: Insufficient documentation

## 2021-12-02 DIAGNOSIS — T82898A Other specified complication of vascular prosthetic devices, implants and grafts, initial encounter: Secondary | ICD-10-CM | POA: Insufficient documentation

## 2021-12-02 DIAGNOSIS — I120 Hypertensive chronic kidney disease with stage 5 chronic kidney disease or end stage renal disease: Secondary | ICD-10-CM | POA: Diagnosis not present

## 2021-12-02 DIAGNOSIS — I770 Arteriovenous fistula, acquired: Secondary | ICD-10-CM

## 2021-12-02 DIAGNOSIS — Y831 Surgical operation with implant of artificial internal device as the cause of abnormal reaction of the patient, or of later complication, without mention of misadventure at the time of the procedure: Secondary | ICD-10-CM | POA: Insufficient documentation

## 2021-12-02 DIAGNOSIS — N185 Chronic kidney disease, stage 5: Secondary | ICD-10-CM | POA: Diagnosis not present

## 2021-12-02 DIAGNOSIS — Z79899 Other long term (current) drug therapy: Secondary | ICD-10-CM | POA: Insufficient documentation

## 2021-12-02 DIAGNOSIS — Z992 Dependence on renal dialysis: Secondary | ICD-10-CM | POA: Diagnosis not present

## 2021-12-02 DIAGNOSIS — Z794 Long term (current) use of insulin: Secondary | ICD-10-CM | POA: Diagnosis not present

## 2021-12-02 HISTORY — PX: FISTULA SUPERFICIALIZATION: SHX6341

## 2021-12-02 HISTORY — DX: Attention-deficit hyperactivity disorder, unspecified type: F90.9

## 2021-12-02 LAB — POCT I-STAT, CHEM 8
BUN: 55 mg/dL — ABNORMAL HIGH (ref 6–20)
Calcium, Ion: 1.02 mmol/L — ABNORMAL LOW (ref 1.15–1.40)
Chloride: 95 mmol/L — ABNORMAL LOW (ref 98–111)
Creatinine, Ser: 12.4 mg/dL — ABNORMAL HIGH (ref 0.61–1.24)
Glucose, Bld: 144 mg/dL — ABNORMAL HIGH (ref 70–99)
HCT: 34 % — ABNORMAL LOW (ref 39.0–52.0)
Hemoglobin: 11.6 g/dL — ABNORMAL LOW (ref 13.0–17.0)
Potassium: 3.9 mmol/L (ref 3.5–5.1)
Sodium: 137 mmol/L (ref 135–145)
TCO2: 28 mmol/L (ref 22–32)

## 2021-12-02 LAB — GLUCOSE, CAPILLARY
Glucose-Capillary: 157 mg/dL — ABNORMAL HIGH (ref 70–99)
Glucose-Capillary: 138 mg/dL — ABNORMAL HIGH (ref 70–99)

## 2021-12-02 SURGERY — FISTULA SUPERFICIALIZATION
Anesthesia: Monitor Anesthesia Care | Site: Arm Lower | Laterality: Left

## 2021-12-02 MED ORDER — CEFAZOLIN SODIUM-DEXTROSE 2-4 GM/100ML-% IV SOLN
2.0000 g | INTRAVENOUS | Status: AC
Start: 2021-12-02 — End: 2021-12-02
  Administered 2021-12-02: 2 g via INTRAVENOUS
  Filled 2021-12-02: qty 100

## 2021-12-02 MED ORDER — PROPOFOL 10 MG/ML IV BOLUS
INTRAVENOUS | Status: DC | PRN
  Administered 2021-12-02: 50 mg via INTRAVENOUS

## 2021-12-02 MED ORDER — HEPARIN 6000 UNIT IRRIGATION SOLUTION
Status: DC | PRN
  Administered 2021-12-02: 1

## 2021-12-02 MED ORDER — HYDROMORPHONE HCL 1 MG/ML IJ SOLN: 0.2500 mg | INTRAMUSCULAR | Status: DC | PRN

## 2021-12-02 MED ORDER — ORAL CARE MOUTH RINSE: 15.0000 mL | Freq: Once | OROMUCOSAL | Status: AC

## 2021-12-02 MED ORDER — CHLORHEXIDINE GLUCONATE 0.12 % MT SOLN
15.0000 mL | Freq: Once | OROMUCOSAL | Status: AC
  Administered 2021-12-02: 15 mL via OROMUCOSAL
  Filled 2021-12-02: qty 15

## 2021-12-02 MED ORDER — FENTANYL CITRATE (PF) 250 MCG/5ML IJ SOLN
INTRAMUSCULAR | Status: DC | PRN
  Administered 2021-12-02: 100 ug via INTRAVENOUS

## 2021-12-02 MED ORDER — OXYCODONE-ACETAMINOPHEN 5-325 MG PO TABS
1.0000 | ORAL_TABLET | Freq: Four times a day (QID) | ORAL | 0 refills | Status: DC | PRN
  Filled 2021-12-02: qty 20, 5d supply, fill #0

## 2021-12-02 MED ORDER — OXYCODONE HCL 5 MG PO TABS: 5.0000 mg | ORAL_TABLET | Freq: Once | ORAL | Status: DC | PRN

## 2021-12-02 MED ORDER — HEPARIN SODIUM (PORCINE) 1000 UNIT/ML IJ SOLN
INTRAMUSCULAR | Status: AC
  Filled 2021-12-02: qty 10

## 2021-12-02 MED ORDER — PROPOFOL 500 MG/50ML IV EMUL
INTRAVENOUS | Status: DC | PRN
Start: 2021-12-02 — End: 2021-12-02
  Administered 2021-12-02: 175 ug/kg/min via INTRAVENOUS

## 2021-12-02 MED ORDER — CHLORHEXIDINE GLUCONATE 4 % EX LIQD: 60.0000 mL | Freq: Once | CUTANEOUS | Status: DC

## 2021-12-02 MED ORDER — 0.9 % SODIUM CHLORIDE (POUR BTL) OPTIME
TOPICAL | Status: DC | PRN
  Administered 2021-12-02: 1000 mL

## 2021-12-02 MED ORDER — FENTANYL CITRATE (PF) 250 MCG/5ML IJ SOLN
INTRAMUSCULAR | Status: AC
  Filled 2021-12-02: qty 5

## 2021-12-02 MED ORDER — INSULIN ASPART 100 UNIT/ML IJ SOLN: 0.0000 [IU] | INTRAMUSCULAR | Status: DC | PRN

## 2021-12-02 MED ORDER — HEPARIN 6000 UNIT IRRIGATION SOLUTION
Status: AC
  Filled 2021-12-02: qty 500

## 2021-12-02 MED ORDER — OXYCODONE HCL 5 MG/5ML PO SOLN: 5.0000 mg | Freq: Once | ORAL | Status: DC | PRN

## 2021-12-02 MED ORDER — ROPIVACAINE HCL 5 MG/ML IJ SOLN
INTRAMUSCULAR | Status: DC | PRN
  Administered 2021-12-02: 30 mL via PERINEURAL

## 2021-12-02 MED ORDER — MIDAZOLAM HCL 2 MG/2ML IJ SOLN
INTRAMUSCULAR | Status: AC
  Filled 2021-12-02: qty 2

## 2021-12-02 MED ORDER — SODIUM CHLORIDE 0.9 % IV SOLN: INTRAVENOUS | Status: DC

## 2021-12-02 MED ORDER — ONDANSETRON HCL 4 MG/2ML IJ SOLN
INTRAMUSCULAR | Status: DC | PRN
  Administered 2021-12-02: 4 mg via INTRAVENOUS

## 2021-12-02 MED ORDER — STERILE WATER FOR IRRIGATION IR SOLN
Status: DC | PRN
  Administered 2021-12-02: 1000 mL

## 2021-12-02 MED ORDER — PROMETHAZINE HCL 25 MG/ML IJ SOLN: 6.2500 mg | INTRAMUSCULAR | Status: DC | PRN

## 2021-12-02 MED ORDER — LIDOCAINE-EPINEPHRINE 1 %-1:100000 IJ SOLN
INTRAMUSCULAR | Status: AC
  Filled 2021-12-02: qty 1

## 2021-12-02 MED ORDER — MIDAZOLAM HCL 2 MG/2ML IJ SOLN
INTRAMUSCULAR | Status: DC | PRN
  Administered 2021-12-02: 2 mg via INTRAVENOUS

## 2021-12-02 SURGICAL SUPPLY — 67 items
APL PRP STRL LF DISP 70% ISPRP (MISCELLANEOUS) ×2
APL SKNCLS STERI-STRIP NONHPOA (GAUZE/BANDAGES/DRESSINGS)
ARMBAND PINK RESTRICT EXTREMIT (MISCELLANEOUS) ×4 IMPLANT
BAG COUNTER SPONGE SURGICOUNT (BAG) ×4 IMPLANT
BAG DECANTER FOR FLEXI CONT (MISCELLANEOUS) ×4 IMPLANT
BAG SPNG CNTER NS LX DISP (BAG) ×2
BANDAGE ESMARK 6X9 LF (GAUZE/BANDAGES/DRESSINGS) ×1 IMPLANT
BENZOIN TINCTURE PRP APPL 2/3 (GAUZE/BANDAGES/DRESSINGS) ×2 IMPLANT
BIOPATCH RED 1 DISK 7.0 (GAUZE/BANDAGES/DRESSINGS) ×4 IMPLANT
BNDG CMPR 9X6 STRL LF SNTH (GAUZE/BANDAGES/DRESSINGS) ×2
BNDG ESMARK 6X9 LF (GAUZE/BANDAGES/DRESSINGS) ×3
CANISTER SUCT 3000ML PPV (MISCELLANEOUS) ×4 IMPLANT
CANNULA VESSEL 3MM 2 BLNT TIP (CANNULA) ×4 IMPLANT
CATH PALINDROME-P 19CM W/VT (CATHETERS) IMPLANT
CATH PALINDROME-P 23CM W/VT (CATHETERS) IMPLANT
CATH PALINDROME-P 28CM W/VT (CATHETERS) IMPLANT
CHLORAPREP W/TINT 26 (MISCELLANEOUS) ×4 IMPLANT
COVER PROBE W GEL 5X96 (DRAPES) ×4 IMPLANT
COVER SURGICAL LIGHT HANDLE (MISCELLANEOUS) ×4 IMPLANT
CUFF TOURN SGL QUICK 18X4 (TOURNIQUET CUFF) ×2 IMPLANT
DRAPE C-ARM 35X43 STRL (DRAPES) ×2 IMPLANT
DRAPE C-ARM 42X72 X-RAY (DRAPES) ×4 IMPLANT
DRAPE CHEST BREAST 15X10 FENES (DRAPES) ×4 IMPLANT
DRSG COVADERM 4X6 (GAUZE/BANDAGES/DRESSINGS) ×2 IMPLANT
DRSG TEGADERM 4X4.75 (GAUZE/BANDAGES/DRESSINGS) ×4 IMPLANT
ELECT REM PT RETURN 9FT ADLT (ELECTROSURGICAL) ×3
ELECTRODE REM PT RTRN 9FT ADLT (ELECTROSURGICAL) ×3 IMPLANT
GAUZE 4X4 16PLY ~~LOC~~+RFID DBL (SPONGE) ×4 IMPLANT
GAUZE SPONGE 4X4 12PLY STRL (GAUZE/BANDAGES/DRESSINGS) ×4 IMPLANT
GLOVE SURG SS PI 8.0 STRL IVOR (GLOVE) ×4 IMPLANT
GOWN STRL REUS W/ TWL LRG LVL3 (GOWN DISPOSABLE) ×6 IMPLANT
GOWN STRL REUS W/ TWL XL LVL3 (GOWN DISPOSABLE) ×3 IMPLANT
GOWN STRL REUS W/TWL LRG LVL3 (GOWN DISPOSABLE) ×6
GOWN STRL REUS W/TWL XL LVL3 (GOWN DISPOSABLE) ×3
INSERT FOGARTY SM (MISCELLANEOUS) IMPLANT
KIT BASIN OR (CUSTOM PROCEDURE TRAY) ×4 IMPLANT
KIT PALINDROME-P 55CM (CATHETERS) IMPLANT
KIT TURNOVER KIT B (KITS) ×4 IMPLANT
NDL 18GX1X1/2 (RX/OR ONLY) (NEEDLE) ×2 IMPLANT
NDL HYPO 25GX1X1/2 BEV (NEEDLE) ×2 IMPLANT
NEEDLE 18GX1X1/2 (RX/OR ONLY) (NEEDLE) ×3 IMPLANT
NEEDLE HYPO 25GX1X1/2 BEV (NEEDLE) ×3 IMPLANT
NS IRRIG 1000ML POUR BTL (IV SOLUTION) ×4 IMPLANT
PACK CV ACCESS (CUSTOM PROCEDURE TRAY) ×4 IMPLANT
PACK SURGICAL SETUP 50X90 (CUSTOM PROCEDURE TRAY) ×4 IMPLANT
PAD ARMBOARD 7.5X6 YLW CONV (MISCELLANEOUS) ×8 IMPLANT
PENCIL SMOKE EVACUATOR (MISCELLANEOUS) ×4 IMPLANT
SET MICROPUNCTURE 5F STIFF (MISCELLANEOUS) ×2 IMPLANT
SLING ARM FOAM STRAP LRG (SOFTGOODS) IMPLANT
SLING ARM FOAM STRAP MED (SOFTGOODS) IMPLANT
SOAP 2 % CHG 4 OZ (WOUND CARE) ×4 IMPLANT
STRIP CLOSURE SKIN 1/2X4 (GAUZE/BANDAGES/DRESSINGS) ×4 IMPLANT
SUT ETHILON 3 0 PS 1 (SUTURE) ×4 IMPLANT
SUT MNCRL AB 4-0 PS2 18 (SUTURE) ×4 IMPLANT
SUT PROLENE 4 0 RB 1 (SUTURE) ×3
SUT PROLENE 4-0 RB1 .5 CRCL 36 (SUTURE) ×1 IMPLANT
SUT PROLENE 6 0 BV (SUTURE) ×4 IMPLANT
SUT VIC AB 3-0 SH 27 (SUTURE) ×3
SUT VIC AB 3-0 SH 27X BRD (SUTURE) ×3 IMPLANT
SYR 10ML LL (SYRINGE) ×4 IMPLANT
SYR 20ML LL LF (SYRINGE) ×8 IMPLANT
SYR 5ML LL (SYRINGE) ×4 IMPLANT
SYR CONTROL 10ML LL (SYRINGE) ×4 IMPLANT
TOWEL GREEN STERILE (TOWEL DISPOSABLE) ×4 IMPLANT
TOWEL GREEN STERILE FF (TOWEL DISPOSABLE) ×8 IMPLANT
UNDERPAD 30X36 HEAVY ABSORB (UNDERPADS AND DIAPERS) ×4 IMPLANT
WATER STERILE IRR 1000ML POUR (IV SOLUTION) ×4 IMPLANT

## 2021-12-02 NOTE — Anesthesia Postprocedure Evaluation (Signed)
Anesthesia Post Note  Patient: RIDGELY ANASTACIO  Procedure(s) Performed: PLICATION OF LEFT RADIUS CEPHALIC FISTULA (Left: Arm Lower)     Patient location during evaluation: PACU Anesthesia Type: Regional and MAC Level of consciousness: awake and alert Pain management: pain level controlled Vital Signs Assessment: post-procedure vital signs reviewed and stable Respiratory status: spontaneous breathing, nonlabored ventilation and respiratory function stable Cardiovascular status: blood pressure returned to baseline and stable Postop Assessment: no apparent nausea or vomiting Anesthetic complications: no   No notable events documented.  Last Vitals:  Vitals:   12/02/21 0920 12/02/21 0935  BP: (!) 176/87 (!) 166/91  Pulse: 73 70  Resp: 12 12  Temp:  36.4 C  SpO2: 97% 97%    Last Pain:  Vitals:   12/02/21 0935  PainSc: 0-No pain                 Lynda Rainwater

## 2021-12-02 NOTE — Anesthesia Preprocedure Evaluation (Signed)
Anesthesia Evaluation  Patient identified by MRN, date of birth, ID band Patient awake    Reviewed: NPO status , Patient's Chart, lab work & pertinent test results  Airway Mallampati: II  TM Distance: >3 FB Neck ROM: Full    Dental  (+) Missing   Pulmonary neg pulmonary ROS,    Pulmonary exam normal        Cardiovascular hypertension, Pt. on medications  Rhythm:Regular Rate:Normal     Neuro/Psych negative neurological ROS  negative psych ROS   GI/Hepatic negative GI ROS, Neg liver ROS,   Endo/Other  diabetes, Poorly Controlled, Insulin Dependent  Renal/GU ESRF and DialysisRenal disease  negative genitourinary   Musculoskeletal negative musculoskeletal ROS (+)   Abdominal (+)  Abdomen: soft. Bowel sounds: normal.  Peds  Hematology  (+) Blood dyscrasia, anemia ,   Anesthesia Other Findings   Reproductive/Obstetrics                             Anesthesia Physical  Anesthesia Plan  ASA: 4  Anesthesia Plan: MAC and Regional   Post-op Pain Management: Regional block* and Minimal or no pain anticipated   Induction: Intravenous  PONV Risk Score and Plan: 1 and Ondansetron, Propofol infusion and Treatment may vary due to age or medical condition  Airway Management Planned: Simple Face Mask, Natural Airway and Nasal Cannula  Additional Equipment: None  Intra-op Plan:   Post-operative Plan:   Informed Consent: I have reviewed the patients History and Physical, chart, labs and discussed the procedure including the risks, benefits and alternatives for the proposed anesthesia with the patient or authorized representative who has indicated his/her understanding and acceptance.     Dental advisory given  Plan Discussed with: CRNA  Anesthesia Plan Comments: (Lab Results      Component                Value               Date                      WBC                      6.2                  08/09/2020                HGB                      7.3 (L)             08/09/2020                HCT                      22.5 (L)            08/09/2020                MCV                      82.7                08/09/2020                PLT  296                 08/09/2020           Lab Results      Component                Value               Date                     )        Anesthesia Quick Evaluation

## 2021-12-02 NOTE — Progress Notes (Signed)
Dr. Sabra Heck notified of elevated BP on DOS (see flow sheet).  No new orders given at this time.  Will continue to monitor.

## 2021-12-02 NOTE — Anesthesia Procedure Notes (Signed)
Anesthesia Regional Block: Supraclavicular block   Pre-Anesthetic Checklist: , timeout performed,  Correct Patient, Correct Site, Correct Laterality,  Correct Procedure, Correct Position, site marked,  Risks and benefits discussed,  Surgical consent,  Pre-op evaluation,  At surgeon's request and post-op pain management  Laterality: Left  Prep: chloraprep       Needles:  Injection technique: Single-shot  Needle Type: Stimiplex     Needle Length: 9cm  Needle Gauge: 21     Additional Needles:   Procedures:,,,, ultrasound used (permanent image in chart),,    Narrative:  Start time: 12/02/2021 7:17 AM End time: 12/02/2021 7:22 AM Injection made incrementally with aspirations every 5 mL.  Performed by: Personally  Anesthesiologist: Lynda Rainwater, MD

## 2021-12-02 NOTE — Progress Notes (Signed)
Orthopedic Tech Progress Note Patient Details:  [name] [facility] Jan 21, 1986 [id number]  Ortho Devices Type of Ortho Device: Arm sling Ortho Device/Splint Interventions: Ordered     Sling dropped off with PACU RN.  Vernona Rieger 12/02/2021, 9:24 AM

## 2021-12-02 NOTE — Transfer of Care (Signed)
Immediate Anesthesia Transfer of Care Note  Patient: [name]  Procedure(s) Performed: PLICATION OF LEFT RADIUS CEPHALIC FISTULA (Left: Arm Lower)  Patient Location: PACU  Anesthesia Type:MAC combined with regional for post-op pain  Level of Consciousness: sedated  Airway & Oxygen Therapy: Patient Spontanous Breathing and Patient connected to nasal cannula oxygen  Post-op Assessment: Report given to RN and Post -op Vital signs reviewed and stable  Post vital signs: Reviewed and stable  Last Vitals:  Vitals Value Taken Time  BP 133/76 12/02/21 0850  Temp    Pulse 80 12/02/21 0851  Resp 13 12/02/21 0851  SpO2 96 % 12/02/21 0851  Vitals shown include unvalidated device data.  Last Pain:  Vitals:   12/02/21 0641  PainSc: 0-No pain         Complications: No notable events documented.

## 2021-12-02 NOTE — Op Note (Signed)
DATE OF SERVICE: 12/02/2021  PATIENT:  [name]  36 y.o. male  PRE-OPERATIVE DIAGNOSIS:  ESRD, aneurysmal radiocephalic arteriovenous fistula  POST-OPERATIVE DIAGNOSIS:  Same  PROCEDURE:   Plication of left radiocephalic arteriovenous fistula  SURGEON:  Surgeon(s) and Role:    * Cherre Robins, MD - Primary  ASSISTANT: Gerri Lins, PA-C  An experienced assistant was required given the complexity of this procedure and the standard of surgical care. My assistant helped with exposure through counter tension, suctioning, ligation and retraction to better visualize the surgical field.  My assistant expedited sewing during the case by following my sutures. Wherever I use the term "we" in the report, my assistant actively helped me with that portion of the procedure.  ANESTHESIA:   general  EBL: minimal  BLOOD ADMINISTERED:none  DRAINS: none   LOCAL MEDICATIONS USED:  NONE  SPECIMEN:  none  COUNTS: confirmed correct.  TOURNIQUET:    Total Tourniquet Time Documented: Upper Arm (Left) - 17 minutes Total: Upper Arm (Left) - 17 minutes   PATIENT DISPOSITION:  PACU - hemodynamically stable.   Delay start of Pharmacological VTE agent (>24hrs) due to surgical blood loss or risk of bleeding: no  INDICATION FOR PROCEDURE: [name] is a 35 y.o. male with aneurysmal left radiocephalic arteriovenous fistula with overlaying skin changes. After careful discussion of risks, benefits, and alternatives the patient was offered plication. The patient understood and wished to proceed.  OPERATIVE FINDINGS: unremarkable plication of left radiocephalic fistula. >76AU of fistula available for access.  DESCRIPTION OF PROCEDURE: After identification of the patient in the pre-operative holding area, the patient was transferred to the operating room. The patient was positioned supine on the operating room table. Anesthesia was induced. The left arm was prepped and draped in standard  fashion. A surgical pause was performed confirming correct patient, procedure, and operative location.  A pneumatic tourniquet was placed across the upper left arm.  The arm was exsanguinated with an Esmarch tourniquet.  The pneumatic tourniquet was inflated.  An ellipse incision was made over the aneurysm in the left wrist.  Incision was carried down through subcutaneous tissue until the aneurysm was identified.  The skin was excised overlying the aneurysmal fistula.  The fistula was partially debrided to allow aneurysmorrhaphy.  A large valve was seen in the aneurysm sac.  This was debrided to improve the flow channel through the fistula.  The sac was then closed using a 2 layer closure of 5-0 Prolene.  The pneumatic tourniquet was released.  A smooth thrill was noted in the fistula.  Hemostasis was achieved in the aneurysmorrhaphy and the surgical bed.  The wound was closed in layers using 3-0 Vicryl and horizontal mattress 3-0 nylon.  The skin was marked more proximally in the arm to help the dialysis nurses find a suitable cannulation site.  I felt it unnecessary to place a tunneled dialysis catheter.  Upon completion of the case instrument and sharps counts were confirmed correct. The patient was transferred to the PACU in good condition. I was present for all portions of the procedure.  [name]. James Breed, MD [facility] Phone Number: [phone] 12/02/2021 8:49 AM

## 2021-12-02 NOTE — Interval H&P Note (Signed)
History and Physical Interval Note:  12/02/2021 7:19 AM  [name]  has presented today for surgery, with the diagnosis of ESRD.  The various methods of treatment have been discussed with the patient and family. After consideration of risks, benefits and other options for treatment, the patient has consented to  Procedure(s) with comments: PLICATION OF LEFT ARM FISTULA (Left) - PERIPHERAL NERVE BLOCK INSERTION OF TUNNELED DIALYSIS CATHETER (N/A) as a surgical intervention.  The patient's history has been reviewed, patient examined, no change in status, stable for surgery.  I have reviewed the patient's chart and labs.  Questions were answered to the patient's satisfaction.     Cherre Robins

## 2021-12-03 ENCOUNTER — Encounter (HOSPITAL_COMMUNITY): Payer: Self-pay | Admitting: Vascular Surgery

## 2021-12-08 ENCOUNTER — Other Ambulatory Visit: Payer: Self-pay

## 2021-12-28 ENCOUNTER — Encounter (HOSPITAL_COMMUNITY): Payer: Self-pay

## 2021-12-28 ENCOUNTER — Inpatient Hospital Stay (HOSPITAL_COMMUNITY)
Admission: EM | Admit: 2021-12-28 | Discharge: 2021-12-30 | DRG: 617 | Disposition: A | Discharge status: Home / Self Care | Payer: Medicaid Other | Attending: Family Medicine | Admitting: Family Medicine

## 2021-12-28 ENCOUNTER — Other Ambulatory Visit: Payer: Self-pay

## 2021-12-28 ENCOUNTER — Ambulatory Visit: Payer: Medicaid Other | Admitting: Podiatry

## 2021-12-28 ENCOUNTER — Emergency Department (HOSPITAL_COMMUNITY): Payer: Medicaid Other

## 2021-12-28 DIAGNOSIS — L089 Local infection of the skin and subcutaneous tissue, unspecified: Secondary | ICD-10-CM

## 2021-12-28 DIAGNOSIS — E1065 Type 1 diabetes mellitus with hyperglycemia: Secondary | ICD-10-CM | POA: Diagnosis present

## 2021-12-28 DIAGNOSIS — H548 Legal blindness, as defined in USA: Secondary | ICD-10-CM | POA: Diagnosis present

## 2021-12-28 DIAGNOSIS — Z79899 Other long term (current) drug therapy: Secondary | ICD-10-CM

## 2021-12-28 DIAGNOSIS — Z794 Long term (current) use of insulin: Secondary | ICD-10-CM

## 2021-12-28 DIAGNOSIS — E109 Type 1 diabetes mellitus without complications: Secondary | ICD-10-CM | POA: Diagnosis present

## 2021-12-28 DIAGNOSIS — E872 Acidosis, unspecified: Secondary | ICD-10-CM | POA: Diagnosis present

## 2021-12-28 DIAGNOSIS — E1122 Type 2 diabetes mellitus with diabetic chronic kidney disease: Secondary | ICD-10-CM | POA: Diagnosis present

## 2021-12-28 DIAGNOSIS — Z8249 Family history of ischemic heart disease and other diseases of the circulatory system: Secondary | ICD-10-CM

## 2021-12-28 DIAGNOSIS — E1069 Type 1 diabetes mellitus with other specified complication: Principal | ICD-10-CM | POA: Diagnosis present

## 2021-12-28 DIAGNOSIS — I160 Hypertensive urgency: Secondary | ICD-10-CM | POA: Diagnosis present

## 2021-12-28 DIAGNOSIS — L97516 Non-pressure chronic ulcer of other part of right foot with bone involvement without evidence of necrosis: Secondary | ICD-10-CM | POA: Diagnosis present

## 2021-12-28 DIAGNOSIS — E7800 Pure hypercholesterolemia, unspecified: Secondary | ICD-10-CM | POA: Diagnosis present

## 2021-12-28 DIAGNOSIS — N186 End stage renal disease: Secondary | ICD-10-CM

## 2021-12-28 DIAGNOSIS — E11628 Type 2 diabetes mellitus with other skin complications: Secondary | ICD-10-CM | POA: Diagnosis not present

## 2021-12-28 DIAGNOSIS — M869 Osteomyelitis, unspecified: Secondary | ICD-10-CM

## 2021-12-28 DIAGNOSIS — M86171 Other acute osteomyelitis, right ankle and foot: Secondary | ICD-10-CM | POA: Diagnosis present

## 2021-12-28 DIAGNOSIS — I422 Other hypertrophic cardiomyopathy: Secondary | ICD-10-CM | POA: Diagnosis present

## 2021-12-28 DIAGNOSIS — M898X9 Other specified disorders of bone, unspecified site: Secondary | ICD-10-CM | POA: Diagnosis present

## 2021-12-28 DIAGNOSIS — Z992 Dependence on renal dialysis: Secondary | ICD-10-CM

## 2021-12-28 DIAGNOSIS — N2581 Secondary hyperparathyroidism of renal origin: Secondary | ICD-10-CM | POA: Diagnosis present

## 2021-12-28 DIAGNOSIS — E10621 Type 1 diabetes mellitus with foot ulcer: Secondary | ICD-10-CM | POA: Diagnosis present

## 2021-12-28 DIAGNOSIS — L97512 Non-pressure chronic ulcer of other part of right foot with fat layer exposed: Secondary | ICD-10-CM

## 2021-12-28 DIAGNOSIS — I120 Hypertensive chronic kidney disease with stage 5 chronic kidney disease or end stage renal disease: Secondary | ICD-10-CM | POA: Diagnosis present

## 2021-12-28 DIAGNOSIS — E1022 Type 1 diabetes mellitus with diabetic chronic kidney disease: Secondary | ICD-10-CM | POA: Diagnosis present

## 2021-12-28 DIAGNOSIS — R739 Hyperglycemia, unspecified: Secondary | ICD-10-CM

## 2021-12-28 DIAGNOSIS — D631 Anemia in chronic kidney disease: Secondary | ICD-10-CM | POA: Diagnosis present

## 2021-12-28 DIAGNOSIS — Z9889 Other specified postprocedural states: Secondary | ICD-10-CM

## 2021-12-28 DIAGNOSIS — Z833 Family history of diabetes mellitus: Secondary | ICD-10-CM

## 2021-12-28 DIAGNOSIS — Z823 Family history of stroke: Secondary | ICD-10-CM

## 2021-12-28 LAB — COMPREHENSIVE METABOLIC PANEL
ALT: 16 U/L (ref 0–44)
AST: 10 U/L — ABNORMAL LOW (ref 15–41)
Albumin: 3.7 g/dL (ref 3.5–5.0)
Alkaline Phosphatase: 62 U/L (ref 38–126)
Anion gap: 21 — ABNORMAL HIGH (ref 5–15)
BUN: 77 mg/dL — ABNORMAL HIGH (ref 6–20)
CO2: 17 mmol/L — ABNORMAL LOW (ref 22–32)
Calcium: 8.9 mg/dL (ref 8.9–10.3)
Chloride: 101 mmol/L (ref 98–111)
Creatinine, Ser: 15.14 mg/dL — ABNORMAL HIGH (ref 0.61–1.24)
GFR, Estimated: 4 mL/min — ABNORMAL LOW (ref >60)
Glucose, Bld: 216 mg/dL — ABNORMAL HIGH (ref 70–99)
Potassium: 4.8 mmol/L (ref 3.5–5.1)
Sodium: 139 mmol/L (ref 135–145)
Total Bilirubin: 0.6 mg/dL (ref 0.3–1.2)
Total Protein: 9.1 g/dL — ABNORMAL HIGH (ref 6.5–8.1)

## 2021-12-28 LAB — CBC WITH DIFFERENTIAL/PLATELET
Abs Immature Granulocytes: 0.03 10*3/uL (ref 0.00–0.07)
Basophils Absolute: 0 10*3/uL (ref 0.0–0.1)
Basophils Relative: 0 %
Eosinophils Absolute: 0.1 10*3/uL (ref 0.0–0.5)
Eosinophils Relative: 2 %
HCT: 34.8 % — ABNORMAL LOW (ref 39.0–52.0)
Hemoglobin: 11.1 g/dL — ABNORMAL LOW (ref 13.0–17.0)
Immature Granulocytes: 0 %
Lymphocytes Relative: 13 %
Lymphs Abs: 1.2 10*3/uL (ref 0.7–4.0)
MCH: 27.3 pg (ref 26.0–34.0)
MCHC: 31.9 g/dL (ref 30.0–36.0)
MCV: 85.5 fL (ref 80.0–100.0)
Monocytes Absolute: 0.6 10*3/uL (ref 0.1–1.0)
Monocytes Relative: 7 %
Neutro Abs: 7.2 10*3/uL (ref 1.7–7.7)
Neutrophils Relative %: 78 %
Platelets: 323 10*3/uL (ref 150–400)
RBC: 4.07 MIL/uL — ABNORMAL LOW (ref 4.22–5.81)
RDW: 16.3 % — ABNORMAL HIGH (ref 11.5–15.5)
WBC: 9.2 10*3/uL (ref 4.0–10.5)
nRBC: 0 % (ref 0.0–0.2)

## 2021-12-28 LAB — C-REACTIVE PROTEIN: CRP: 6.5 mg/dL — ABNORMAL HIGH (ref <1.0)

## 2021-12-28 LAB — SEDIMENTATION RATE: Sed Rate: 85 mm/hr — ABNORMAL HIGH (ref 0–16)

## 2021-12-28 NOTE — ED Triage Notes (Signed)
Sent by podiatrist for rule out osteo to right foot from ulcer.

## 2021-12-28 NOTE — Progress Notes (Signed)
  Subjective:  Patient ID: [name], male    DOB: [birth year],  MRN: [id number]  Chief Complaint  Patient presents with   Foot Ulcer    Right fifth toe    36 y.o. male presents with the above complaint. History confirmed with patient.  He is on dialysis and has type 2 diabetes.  His A1c is doing better now it was over 10% at 1 point but is now 6.9%.  He feels embarrassed about his feet.  Interval history: Says he feels he is gotten a lot worse there is quite a bit of drainage and blistering recently  Objective:  Physical Exam: warm, good capillary refill, no trophic changes or ulcerative lesions, normal DP and PT pulses, and absent protective sensation he has tinea pedis and ulceration and skin breakdown in the first and fourth interspace, the fourth interspace superficial tinea infection has now progressed to full ulceration with exposure of the proximal phalanx, there is malodor and serous drainage.  Edema on the right foot   Radiographs: Multiple views x-ray of the right foot: no soft tissue emphysema, no signs of osteomyelitis Assessment:   1. Skin ulcer of right foot including toes with fat layer exposed ([place])   2. Osteomyelitis of fifth toe of right foot ([place])   3. Type 2 diabetes mellitus with right diabetic foot infection ([place])       Plan:  Patient was evaluated and treated and all questions answered.  Unfortunately he has progressed to a full ulceration with exposure of the proximal phalanx of the fifth toe.  I expect he likely will need amputation of the toe at this point.  His foot is edematous and warm and has malodorous drainage.  I recommended he proceed to the emergency room with a diabetic foot infection and plan for surgery I notified our on-call provider Dr. Blenda Mounts, I recommend on admission he have IV antibiotics and an MRI of the right foot. No follow-ups on file.

## 2021-12-28 NOTE — ED Provider Triage Note (Signed)
Emergency Medicine Provider Triage Evaluation Note  [name] , a 36 y.o. male  was evaluated in triage.  Pt complains of right foot infection.  He noticed the area gradually worsening and is being followed by podiatry.  They evaluated him today and was concerned about his right fifth digit wound.  He was placed on antibiotics 3 weeks ago but he did not get the prescription filled.  Sent to the ER for concern for osteomyelitis in this toe.  Review of Systems  Positive: Foot wound Negative: Fever  Physical Exam  BP (!) 189/110 (BP Location: Right Arm)   Pulse 100   Resp 19   SpO2 100%  Gen:   Awake, no distress   Resp:  Normal effort  MSK:   Moves extremities without difficulty  Other:  2+ DP pulse palpated on right foot patient unwilling to remove sock to evaluate wound  Medical Decision Making  Medically screening exam initiated at 5:36 PM.  Appropriate orders placed.  ANAV LAMMERT was informed that the remainder of the evaluation will be completed by another provider, this initial triage assessment does not replace that evaluation, and the importance of remaining in the ED until their evaluation is complete.  We will order labs and imaging   Delia Heady, PA-C 12/28/21 1737

## 2021-12-29 ENCOUNTER — Encounter (HOSPITAL_COMMUNITY): Payer: Self-pay | Admitting: Emergency Medicine

## 2021-12-29 ENCOUNTER — Encounter (HOSPITAL_COMMUNITY)
Admission: EM | Disposition: A | Discharge status: Home / Self Care | Payer: Self-pay | Source: Home / Self Care | Attending: Internal Medicine

## 2021-12-29 ENCOUNTER — Inpatient Hospital Stay (HOSPITAL_COMMUNITY): Payer: Medicaid Other

## 2021-12-29 ENCOUNTER — Inpatient Hospital Stay (HOSPITAL_COMMUNITY): Payer: Medicaid Other | Admitting: Certified Registered"

## 2021-12-29 ENCOUNTER — Other Ambulatory Visit: Payer: Self-pay

## 2021-12-29 DIAGNOSIS — E1022 Type 1 diabetes mellitus with diabetic chronic kidney disease: Secondary | ICD-10-CM | POA: Diagnosis present

## 2021-12-29 DIAGNOSIS — Z79899 Other long term (current) drug therapy: Secondary | ICD-10-CM | POA: Diagnosis not present

## 2021-12-29 DIAGNOSIS — I132 Hypertensive heart and chronic kidney disease with heart failure and with stage 5 chronic kidney disease, or end stage renal disease: Secondary | ICD-10-CM

## 2021-12-29 DIAGNOSIS — Z992 Dependence on renal dialysis: Secondary | ICD-10-CM

## 2021-12-29 DIAGNOSIS — E7800 Pure hypercholesterolemia, unspecified: Secondary | ICD-10-CM | POA: Diagnosis present

## 2021-12-29 DIAGNOSIS — I120 Hypertensive chronic kidney disease with stage 5 chronic kidney disease or end stage renal disease: Secondary | ICD-10-CM | POA: Diagnosis present

## 2021-12-29 DIAGNOSIS — M869 Osteomyelitis, unspecified: Secondary | ICD-10-CM | POA: Diagnosis not present

## 2021-12-29 DIAGNOSIS — Z8249 Family history of ischemic heart disease and other diseases of the circulatory system: Secondary | ICD-10-CM | POA: Diagnosis not present

## 2021-12-29 DIAGNOSIS — L97516 Non-pressure chronic ulcer of other part of right foot with bone involvement without evidence of necrosis: Secondary | ICD-10-CM | POA: Diagnosis present

## 2021-12-29 DIAGNOSIS — H548 Legal blindness, as defined in USA: Secondary | ICD-10-CM | POA: Diagnosis present

## 2021-12-29 DIAGNOSIS — E1122 Type 2 diabetes mellitus with diabetic chronic kidney disease: Secondary | ICD-10-CM | POA: Diagnosis not present

## 2021-12-29 DIAGNOSIS — I509 Heart failure, unspecified: Secondary | ICD-10-CM

## 2021-12-29 DIAGNOSIS — D631 Anemia in chronic kidney disease: Secondary | ICD-10-CM | POA: Diagnosis present

## 2021-12-29 DIAGNOSIS — Z823 Family history of stroke: Secondary | ICD-10-CM | POA: Diagnosis not present

## 2021-12-29 DIAGNOSIS — Z9889 Other specified postprocedural states: Secondary | ICD-10-CM

## 2021-12-29 DIAGNOSIS — N2581 Secondary hyperparathyroidism of renal origin: Secondary | ICD-10-CM | POA: Diagnosis present

## 2021-12-29 DIAGNOSIS — I422 Other hypertrophic cardiomyopathy: Secondary | ICD-10-CM | POA: Diagnosis present

## 2021-12-29 DIAGNOSIS — E1169 Type 2 diabetes mellitus with other specified complication: Secondary | ICD-10-CM | POA: Diagnosis not present

## 2021-12-29 DIAGNOSIS — M86171 Other acute osteomyelitis, right ankle and foot: Secondary | ICD-10-CM | POA: Diagnosis present

## 2021-12-29 DIAGNOSIS — E1065 Type 1 diabetes mellitus with hyperglycemia: Secondary | ICD-10-CM | POA: Diagnosis present

## 2021-12-29 DIAGNOSIS — E1069 Type 1 diabetes mellitus with other specified complication: Secondary | ICD-10-CM | POA: Diagnosis present

## 2021-12-29 DIAGNOSIS — I160 Hypertensive urgency: Secondary | ICD-10-CM

## 2021-12-29 DIAGNOSIS — M898X9 Other specified disorders of bone, unspecified site: Secondary | ICD-10-CM | POA: Diagnosis present

## 2021-12-29 DIAGNOSIS — Z833 Family history of diabetes mellitus: Secondary | ICD-10-CM | POA: Diagnosis not present

## 2021-12-29 DIAGNOSIS — N186 End stage renal disease: Secondary | ICD-10-CM

## 2021-12-29 DIAGNOSIS — E872 Acidosis, unspecified: Secondary | ICD-10-CM | POA: Diagnosis present

## 2021-12-29 DIAGNOSIS — R739 Hyperglycemia, unspecified: Secondary | ICD-10-CM | POA: Diagnosis present

## 2021-12-29 DIAGNOSIS — E10621 Type 1 diabetes mellitus with foot ulcer: Secondary | ICD-10-CM | POA: Diagnosis present

## 2021-12-29 DIAGNOSIS — Z794 Long term (current) use of insulin: Secondary | ICD-10-CM | POA: Diagnosis not present

## 2021-12-29 DIAGNOSIS — M86671 Other chronic osteomyelitis, right ankle and foot: Secondary | ICD-10-CM | POA: Diagnosis not present

## 2021-12-29 HISTORY — PX: AMPUTATION TOE: SHX6595

## 2021-12-29 LAB — POCT I-STAT, CHEM 8
BUN: 100 mg/dL — ABNORMAL HIGH (ref 6–20)
Calcium, Ion: 1 mmol/L — ABNORMAL LOW (ref 1.15–1.40)
Chloride: 108 mmol/L (ref 98–111)
Creatinine, Ser: >18 mg/dL — ABNORMAL HIGH (ref 0.61–1.24)
Glucose, Bld: 91 mg/dL (ref 70–99)
HCT: 34 % — ABNORMAL LOW (ref 39.0–52.0)
Hemoglobin: 11.6 g/dL — ABNORMAL LOW (ref 13.0–17.0)
Potassium: 4.7 mmol/L (ref 3.5–5.1)
Sodium: 137 mmol/L (ref 135–145)
TCO2: 15 mmol/L — ABNORMAL LOW (ref 22–32)

## 2021-12-29 LAB — CBG MONITORING, ED
Glucose-Capillary: 163 mg/dL — ABNORMAL HIGH (ref 70–99)
Glucose-Capillary: 132 mg/dL — ABNORMAL HIGH (ref 70–99)
Glucose-Capillary: 102 mg/dL — ABNORMAL HIGH (ref 70–99)

## 2021-12-29 LAB — GLUCOSE, CAPILLARY
Glucose-Capillary: 97 mg/dL (ref 70–99)
Glucose-Capillary: 92 mg/dL (ref 70–99)
Glucose-Capillary: 160 mg/dL — ABNORMAL HIGH (ref 70–99)

## 2021-12-29 LAB — LACTIC ACID, PLASMA: Lactic Acid, Venous: 0.9 mmol/L (ref 0.5–1.9)

## 2021-12-29 SURGERY — AMPUTATION, TOE
Anesthesia: Monitor Anesthesia Care | Site: Toe | Laterality: Right

## 2021-12-29 MED ORDER — CALCITRIOL 0.5 MCG PO CAPS: 1.0000 ug | ORAL_CAPSULE | ORAL | Status: DC

## 2021-12-29 MED ORDER — CHLORHEXIDINE GLUCONATE CLOTH 2 % EX PADS: 6.0000 | MEDICATED_PAD | Freq: Once | CUTANEOUS | Status: DC

## 2021-12-29 MED ORDER — ATORVASTATIN CALCIUM 40 MG PO TABS
40.0000 mg | ORAL_TABLET | Freq: Every day | ORAL | Status: DC
  Administered 2021-12-29 – 2021-12-30 (×2): 40 mg via ORAL
  Filled 2021-12-29 (×2): qty 1

## 2021-12-29 MED ORDER — LIDOCAINE HCL 1 % IJ SOLN
INTRAMUSCULAR | Status: AC
Start: 2021-12-29 — End: ?
  Filled 2021-12-29: qty 20

## 2021-12-29 MED ORDER — LIDOCAINE 2% (20 MG/ML) 5 ML SYRINGE
INTRAMUSCULAR | Status: AC
  Filled 2021-12-29: qty 5

## 2021-12-29 MED ORDER — BUPIVACAINE HCL (PF) 0.5 % IJ SOLN
INTRAMUSCULAR | Status: AC
Start: 2021-12-29 — End: ?
  Filled 2021-12-29: qty 30

## 2021-12-29 MED ORDER — ONDANSETRON HCL 4 MG/2ML IJ SOLN: 4.0000 mg | Freq: Four times a day (QID) | INTRAMUSCULAR | Status: DC | PRN

## 2021-12-29 MED ORDER — PROPOFOL 1000 MG/100ML IV EMUL
INTRAVENOUS | Status: AC
Start: 2021-12-29 — End: ?
  Filled 2021-12-29: qty 100

## 2021-12-29 MED ORDER — ORAL CARE MOUTH RINSE: 15.0000 mL | Freq: Once | OROMUCOSAL | Status: AC

## 2021-12-29 MED ORDER — METRONIDAZOLE 500 MG PO TABS
500.0000 mg | ORAL_TABLET | Freq: Two times a day (BID) | ORAL | Status: DC
  Administered 2021-12-29 – 2021-12-30 (×3): 500 mg via ORAL
  Filled 2021-12-29 (×3): qty 1

## 2021-12-29 MED ORDER — SODIUM CHLORIDE 0.9 % IV SOLN
2.0000 g | Freq: Once | INTRAVENOUS | Status: AC
  Administered 2021-12-29: 2 g via INTRAVENOUS
  Filled 2021-12-29: qty 12.5

## 2021-12-29 MED ORDER — VANCOMYCIN HCL IN DEXTROSE 1-5 GM/200ML-% IV SOLN: 1000.0000 mg | INTRAVENOUS | Status: DC

## 2021-12-29 MED ORDER — CEFEPIME HCL 1 G IJ SOLR
1.0000 g | INTRAMUSCULAR | Status: DC
Start: 2021-12-29 — End: 2021-12-31
  Administered 2021-12-29: 1 g via INTRAVENOUS
  Filled 2021-12-29 (×2): qty 10

## 2021-12-29 MED ORDER — FENTANYL CITRATE (PF) 100 MCG/2ML IJ SOLN
INTRAMUSCULAR | Status: AC
  Filled 2021-12-29: qty 2

## 2021-12-29 MED ORDER — FENTANYL CITRATE (PF) 100 MCG/2ML IJ SOLN: 25.0000 ug | INTRAMUSCULAR | Status: DC | PRN

## 2021-12-29 MED ORDER — LIDOCAINE HCL 1 % IJ SOLN
INTRAMUSCULAR | Status: DC | PRN
  Administered 2021-12-29: 5 mL

## 2021-12-29 MED ORDER — CHLORHEXIDINE GLUCONATE 0.12 % MT SOLN: 15.0000 mL | Freq: Once | OROMUCOSAL | Status: AC

## 2021-12-29 MED ORDER — ACETAMINOPHEN 650 MG RE SUPP: 650.0000 mg | Freq: Four times a day (QID) | RECTAL | Status: DC | PRN

## 2021-12-29 MED ORDER — SEVELAMER CARBONATE 800 MG PO TABS
1600.0000 mg | ORAL_TABLET | Freq: Three times a day (TID) | ORAL | Status: DC
  Administered 2021-12-29 – 2021-12-30 (×4): 1600 mg via ORAL
  Filled 2021-12-29 (×4): qty 2

## 2021-12-29 MED ORDER — ACETAMINOPHEN 325 MG PO TABS
650.0000 mg | ORAL_TABLET | Freq: Four times a day (QID) | ORAL | Status: DC | PRN
  Administered 2021-12-30 (×2): 650 mg via ORAL
  Filled 2021-12-29 (×2): qty 2

## 2021-12-29 MED ORDER — PROPOFOL 500 MG/50ML IV EMUL
INTRAVENOUS | Status: DC | PRN
  Administered 2021-12-29: 125 ug/kg/min via INTRAVENOUS

## 2021-12-29 MED ORDER — FENTANYL CITRATE (PF) 250 MCG/5ML IJ SOLN
INTRAMUSCULAR | Status: DC | PRN
  Administered 2021-12-29 (×2): 50 ug via INTRAVENOUS

## 2021-12-29 MED ORDER — MIDAZOLAM HCL 2 MG/2ML IJ SOLN
INTRAMUSCULAR | Status: DC | PRN
  Administered 2021-12-29: 2 mg via INTRAVENOUS

## 2021-12-29 MED ORDER — ONDANSETRON HCL 4 MG/2ML IJ SOLN
INTRAMUSCULAR | Status: DC | PRN
  Administered 2021-12-29: 4 mg via INTRAVENOUS

## 2021-12-29 MED ORDER — VANCOMYCIN HCL 2000 MG/400ML IV SOLN
2000.0000 mg | Freq: Once | INTRAVENOUS | Status: AC
  Administered 2021-12-29: 2000 mg via INTRAVENOUS
  Filled 2021-12-29: qty 400

## 2021-12-29 MED ORDER — BUPIVACAINE HCL (PF) 0.5 % IJ SOLN
INTRAMUSCULAR | Status: DC | PRN
  Administered 2021-12-29: 5 mL

## 2021-12-29 MED ORDER — OXYCODONE HCL 5 MG/5ML PO SOLN: 5.0000 mg | Freq: Once | ORAL | Status: DC | PRN

## 2021-12-29 MED ORDER — MIDAZOLAM HCL 2 MG/2ML IJ SOLN
INTRAMUSCULAR | Status: AC
  Filled 2021-12-29: qty 2

## 2021-12-29 MED ORDER — VANCOMYCIN HCL IN DEXTROSE 1-5 GM/200ML-% IV SOLN
1000.0000 mg | INTRAVENOUS | Status: DC
  Administered 2021-12-29: 1000 mg via INTRAVENOUS
  Filled 2021-12-29: qty 200

## 2021-12-29 MED ORDER — ONDANSETRON HCL 4 MG/2ML IJ SOLN
INTRAMUSCULAR | Status: AC
  Filled 2021-12-29: qty 2

## 2021-12-29 MED ORDER — CHLORHEXIDINE GLUCONATE CLOTH 2 % EX PADS
6.0000 | MEDICATED_PAD | Freq: Every day | CUTANEOUS | Status: DC
  Administered 2021-12-30: 6 via TOPICAL

## 2021-12-29 MED ORDER — CARVEDILOL 12.5 MG PO TABS: 25.0000 mg | ORAL_TABLET | Freq: Two times a day (BID) | ORAL | Status: DC

## 2021-12-29 MED ORDER — CHLORHEXIDINE GLUCONATE 0.12 % MT SOLN
OROMUCOSAL | Status: AC
  Administered 2021-12-29: 15 mL via OROMUCOSAL
  Filled 2021-12-29: qty 15

## 2021-12-29 MED ORDER — OXYCODONE HCL 5 MG PO TABS: 5.0000 mg | ORAL_TABLET | Freq: Once | ORAL | Status: DC | PRN

## 2021-12-29 MED ORDER — PROPOFOL 10 MG/ML IV BOLUS
INTRAVENOUS | Status: AC
  Filled 2021-12-29: qty 20

## 2021-12-29 MED ORDER — ONDANSETRON HCL 4 MG/2ML IJ SOLN
4.0000 mg | Freq: Once | INTRAMUSCULAR | Status: AC
  Administered 2021-12-29: 4 mg via INTRAVENOUS
  Filled 2021-12-29: qty 2

## 2021-12-29 MED ORDER — MIDAZOLAM HCL 2 MG/2ML IJ SOLN
INTRAMUSCULAR | Status: AC
Start: 2021-12-29 — End: ?
  Filled 2021-12-29: qty 2

## 2021-12-29 MED ORDER — INSULIN GLARGINE-YFGN 100 UNIT/ML ~~LOC~~ SOLN
8.0000 [IU] | Freq: Every day | SUBCUTANEOUS | Status: DC
  Administered 2021-12-29: 8 [IU] via SUBCUTANEOUS
  Filled 2021-12-29 (×2): qty 0.08

## 2021-12-29 MED ORDER — LIDOCAINE 2% (20 MG/ML) 5 ML SYRINGE
INTRAMUSCULAR | Status: DC | PRN
  Administered 2021-12-29: 40 mg via INTRAVENOUS

## 2021-12-29 MED ORDER — CARVEDILOL 25 MG PO TABS
25.0000 mg | ORAL_TABLET | Freq: Two times a day (BID) | ORAL | Status: DC
  Administered 2021-12-29 – 2021-12-30 (×2): 25 mg via ORAL
  Filled 2021-12-29: qty 2
  Filled 2021-12-29: qty 1

## 2021-12-29 MED ORDER — 0.9 % SODIUM CHLORIDE (POUR BTL) OPTIME
TOPICAL | Status: DC | PRN
  Administered 2021-12-29: 1000 mL

## 2021-12-29 MED ORDER — FENTANYL CITRATE (PF) 250 MCG/5ML IJ SOLN
INTRAMUSCULAR | Status: AC
  Filled 2021-12-29: qty 5

## 2021-12-29 MED ORDER — SODIUM CHLORIDE 0.9 % IV SOLN: INTRAVENOUS | Status: DC

## 2021-12-29 MED ORDER — INSULIN ASPART 100 UNIT/ML IJ SOLN
0.0000 [IU] | INTRAMUSCULAR | Status: DC
  Administered 2021-12-29 – 2021-12-30 (×4): 1 [IU] via SUBCUTANEOUS

## 2021-12-29 SURGICAL SUPPLY — 48 items
APL PRP STRL LF DISP 70% ISPRP (MISCELLANEOUS)
BAG COUNTER SPONGE SURGICOUNT (BAG) ×3 IMPLANT
BAG SPNG CNTER NS LX DISP (BAG) ×1
BLADE SURG 15 STRL LF DISP TIS (BLADE) IMPLANT
BLADE SURG 15 STRL SS (BLADE)
BNDG ELASTIC 4X5.8 VLCR STR LF (GAUZE/BANDAGES/DRESSINGS) ×1 IMPLANT
BNDG GAUZE DERMACEA FLUFF (GAUZE/BANDAGES/DRESSINGS) ×1
BNDG GAUZE DERMACEA FLUFF 4 (GAUZE/BANDAGES/DRESSINGS) IMPLANT
BNDG GAUZE ELAST 4 BULKY (GAUZE/BANDAGES/DRESSINGS) ×3 IMPLANT
BNDG GZE DERMACEA 4 6PLY (GAUZE/BANDAGES/DRESSINGS) ×1
CHLORAPREP W/TINT 26 (MISCELLANEOUS) IMPLANT
COVER SURGICAL LIGHT HANDLE (MISCELLANEOUS) ×3 IMPLANT
CUFF TOURN SGL QUICK 18X4 (TOURNIQUET CUFF) ×3 IMPLANT
CUFF TOURN SGL QUICK 24 (TOURNIQUET CUFF)
CUFF TRNQT CYL 24X4X16.5-23 (TOURNIQUET CUFF) IMPLANT
DRSG EMULSION OIL 3X3 NADH (GAUZE/BANDAGES/DRESSINGS) ×1 IMPLANT
DRSG PAD ABDOMINAL 8X10 ST (GAUZE/BANDAGES/DRESSINGS) ×3 IMPLANT
ELECT REM PT RETURN 9FT ADLT (ELECTROSURGICAL)
ELECTRODE REM PT RTRN 9FT ADLT (ELECTROSURGICAL) IMPLANT
GAUZE SPONGE 4X4 12PLY STRL (GAUZE/BANDAGES/DRESSINGS) ×3 IMPLANT
GAUZE XEROFORM 1X8 LF (GAUZE/BANDAGES/DRESSINGS) ×3 IMPLANT
GLOVE BIO SURGEON STRL SZ8 (GLOVE) ×3 IMPLANT
GLOVE BIOGEL PI IND STRL 8 (GLOVE) ×2 IMPLANT
GLOVE BIOGEL PI INDICATOR 8 (GLOVE) ×1
GOWN STRL REUS W/ TWL LRG LVL3 (GOWN DISPOSABLE) ×4 IMPLANT
GOWN STRL REUS W/TWL LRG LVL3 (GOWN DISPOSABLE) ×4
KIT BASIN OR (CUSTOM PROCEDURE TRAY) ×3 IMPLANT
KIT TURNOVER KIT B (KITS) ×3 IMPLANT
NDL PRECISIONGLIDE 27X1.5 (NEEDLE) ×2 IMPLANT
NEEDLE PRECISIONGLIDE 27X1.5 (NEEDLE) ×2 IMPLANT
NS IRRIG 1000ML POUR BTL (IV SOLUTION) ×3 IMPLANT
PACK ORTHO EXTREMITY (CUSTOM PROCEDURE TRAY) ×3 IMPLANT
PAD ARMBOARD 7.5X6 YLW CONV (MISCELLANEOUS) ×6 IMPLANT
PAD CAST 4YDX4 CTTN HI CHSV (CAST SUPPLIES) ×2 IMPLANT
PADDING CAST COTTON 4X4 STRL (CAST SUPPLIES) ×2
SOL PREP POV-IOD 4OZ 10% (MISCELLANEOUS) ×3 IMPLANT
STAPLER VISISTAT 35W (STAPLE) IMPLANT
SUT ETHILON 3 0 FSL (SUTURE) ×1 IMPLANT
SUT PROLENE 4 0 PS 2 18 (SUTURE) IMPLANT
SUT VIC AB 3-0 PS2 18 (SUTURE) IMPLANT
SUT VICRYL 4-0 PS2 18IN ABS (SUTURE) IMPLANT
SWAB COLLECTION DEVICE MRSA (MISCELLANEOUS) ×1 IMPLANT
SWAB CULTURE ESWAB REG 1ML (MISCELLANEOUS) ×1 IMPLANT
SYR CONTROL 10ML LL (SYRINGE) ×3 IMPLANT
TOWEL GREEN STERILE (TOWEL DISPOSABLE) ×3 IMPLANT
TOWEL GREEN STERILE FF (TOWEL DISPOSABLE) ×3 IMPLANT
TUBE CONNECTING 12X1/4 (SUCTIONS) ×3 IMPLANT
YANKAUER SUCT BULB TIP NO VENT (SUCTIONS) IMPLANT

## 2021-12-29 NOTE — Consult Note (Signed)
Subjective:  Patient ID: [name], male    DOB: 1985/12/26,  MRN: [id number]  Patient with past medical history of  type 2 diabetes mellitus, hypertension, ESRD on hemodialysis, seen at bedside in ED this morning for infection of right fifth digit. He was seen by Dr. Sherryle Lis yesterday due to tinea and found to have exposed bone in the fifth toe and worsening infection. Advised to come to ED for IV antibiotics and plan for surgery. Relates this morning doing ok with minimal pain.   Past Medical History:  Diagnosis Date   ADHD (attention deficit hyperactivity disorder)    Diabetes mellitus    ESRD on hemodialysis ([place])    [facility]   High cholesterol    Hypertension    Hypertrophic cardiomyopathy ([place]) 02/26/2021   Seizure ([place]) 02/08/2021     Past Surgical History:  Procedure Laterality Date   AV FISTULA PLACEMENT Left 08/11/2020   Procedure: LEFT UPPER EXTREMITY ARTERIOVENOUS (AV) FISTULA CREATION;  Surgeon: Cherre Robins, MD;  Location: [facility];  Service: Vascular;  Laterality: Left;   FISTULA SUPERFICIALIZATION Left 6/43/3295   Procedure: PLICATION OF LEFT RADIUS CEPHALIC FISTULA;  Surgeon: Cherre Robins, MD;  Location: [facility];  Service: Vascular;  Laterality: Left;  PERIPHERAL NERVE BLOCK   FRACTURE SURGERY     I & D EXTREMITY Left 07/16/2014   Procedure: IRRIGATION AND DEBRIDEMENT EXTREMITY/LEFT INDEX FINGER;  Surgeon: Leanora Cover, MD;  Location: [facility];  Service: Orthopedics;  Laterality: Left;   IR PERC TUN PERIT CATH WO PORT S&I /IMAG  08/07/2020   IR US GUIDE VASC ACCESS RIGHT  08/07/2020       Latest Ref Rng & Units 12/28/2021    5:40 PM 12/02/2021    6:34 AM 10/31/2021    5:24 AM  CBC  WBC 4.0 - 10.5 K/uL 9.2   10.3   Hemoglobin 13.0 - 17.0 g/dL 11.1  11.6  7.5   Hematocrit 39.0 - 52.0 % 34.8  34.0  23.8   Platelets 150 - 400 K/uL 323   214        Latest Ref Rng & Units 12/28/2021    5:40 PM 12/02/2021    6:34 AM 10/31/2021    5:24 AM  BMP   Glucose 70 - 99 mg/dL 216  144  117   BUN 6 - 20 mg/dL 77  55  35   Creatinine 0.61 - 1.24 mg/dL 15.14  12.40  8.66   Sodium 135 - 145 mmol/L 139  137  138   Potassium 3.5 - 5.1 mmol/L 4.8  3.9  3.8   Chloride 98 - 111 mmol/L 101  95  97   CO2 22 - 32 mmol/L 17   26   Calcium 8.9 - 10.3 mg/dL 8.9   8.2      Objective:   Vitals:   12/29/21 0302 12/29/21 0613  BP: (!) 165/100 (!) 157/89  Pulse: 89 95  Resp: 17 18  Temp:    SpO2: 99% 98%    General:AA&O x 3. Normal mood and affect   Vascular: DP and PT pulses 2/4 bilateral. Brisk capillary refill to all digits. Pedal hair present   Neruological. Epicritic sensation grossly intact.   Derm: Right fifth digit wound with fibrotic necrotic  base measuring Serous drainage present Proximal phalanx visible with  probe to bone. Interspaces  macerations in first as well.   MSK: MMT 5/5 in dorsiflexion, plantar flexion, inversion and eversion. Normal joint  ROM without pain or crepitus.      Assessment & Plan:  Patient was evaluated and treated and all questions answered.  DX: Right fifth toe osteomyelitis Wound care: betadine, DSD  Antibiotics: Per primary  DME: Post-op shoe   Discussed with patient diagnosis and treatment options.  Imaging reviewed. Radiographs reveal break down of proximal fifth phalanx concerning for osteomyelitis  Discussed with patient treatment options. Discussed need for amputation of the fifth digit due to infection.  Will plan for OR this afternoon for right fifth digit amputation.  Continue NPO status.   Patient in agreement with plan and all questions answered.   Lorenda Peck, DPM  Accessible via secure chat for questions or concerns.

## 2021-12-29 NOTE — ED Notes (Signed)
Attempted to get pt ready for OR pt stated he not taking his boxers and socks off he that was gay. Advised pt he would have to remove them to go to the OR. Pt stated they can remove them that's what they get paid for

## 2021-12-29 NOTE — Anesthesia Preprocedure Evaluation (Signed)
Anesthesia Evaluation  Patient identified by MRN, date of birth, ID band Patient awake    Reviewed: Allergy & Precautions, H&P , NPO status , Patient's Chart, lab work & pertinent test results  Airway Mallampati: II   Neck ROM: full    Dental   Pulmonary neg pulmonary ROS,    breath sounds clear to auscultation       Cardiovascular hypertension, +CHF   Rhythm:regular Rate:Normal  EF 45-50%.   Neuro/Psych Seizures -,     GI/Hepatic   Endo/Other  diabetes, Type 2  Renal/GU ESRF and DialysisRenal disease     Musculoskeletal   Abdominal   Peds  Hematology   Anesthesia Other Findings   Reproductive/Obstetrics                             Anesthesia Physical Anesthesia Plan  ASA: 3  Anesthesia Plan: MAC   Post-op Pain Management:    Induction: Intravenous  PONV Risk Score and Plan: 1 and Treatment may vary due to age or medical condition, Ondansetron and Propofol infusion  Airway Management Planned: Simple Face Mask  Additional Equipment:   Intra-op Plan:   Post-operative Plan:   Informed Consent: I have reviewed the patients History and Physical, chart, labs and discussed the procedure including the risks, benefits and alternatives for the proposed anesthesia with the patient or authorized representative who has indicated his/her understanding and acceptance.     Dental advisory given  Plan Discussed with: CRNA, Anesthesiologist and Surgeon  Anesthesia Plan Comments:         Anesthesia Quick Evaluation

## 2021-12-29 NOTE — Brief Op Note (Signed)
12/28/2021 - 12/29/2021  6:09 PM  PATIENT:  [name]  36 y.o. male  PRE-OPERATIVE DIAGNOSIS:  Right fifth digit osteomyelitis  POST-OPERATIVE DIAGNOSIS:  * No post-op diagnosis entered *  PROCEDURE:  Procedure(s) with comments: AMPUTATION TOE, fifth (Right) - surgical team will do local block  SURGEON:  Surgeon(s) and Role:    * Lorenda Peck, DPM - Primary  PHYSICIAN ASSISTANT:   ASSISTANTS: none   ANESTHESIA:   local and MAC  EBL:  Minimal  BLOOD ADMINISTERED:none  DRAINS: none   LOCAL MEDICATIONS USED:  MARCAINE   , LIDOCAINE , and Amount: 10  ml  SPECIMEN:  Source of Specimen:  right fifth digit  DISPOSITION OF SPECIMEN:   Pathology for fifth digit and culture swab of wound sent for culture   COUNTS:  YES  TOURNIQUET:  * Missing tourniquet times found for documented tourniquets in log: [id number] *  DICTATION: .Note written in EPIC  PLAN OF CARE: Admit to inpatient   PATIENT DISPOSITION:  PACU - hemodynamically stable.   Delay start of Pharmacological VTE agent (>24hrs) due to surgical blood loss or risk of bleeding: not applicable

## 2021-12-29 NOTE — Progress Notes (Signed)
Orthopedic Tech Progress Note Patient Details:  [name] 02/01/1986 [id number]  Ortho Devices Type of Ortho Device: Postop shoe/boot Ortho Device/Splint Interventions: Ordered      Danton Sewer A Chellsie Gomer 12/29/2021, 7:52 PM

## 2021-12-29 NOTE — H&P (View-Only) (Signed)
Subjective:  Patient ID: [name], male    DOB: 1986/02/26,  MRN: [id number]  Patient with past medical history of  type 2 diabetes mellitus, hypertension, ESRD on hemodialysis, seen at bedside in ED this morning for infection of right fifth digit. He was seen by Dr. Sherryle Lis yesterday due to tinea and found to have exposed bone in the fifth toe and worsening infection. Advised to come to ED for IV antibiotics and plan for surgery. Relates this morning doing ok with minimal pain.   Past Medical History:  Diagnosis Date   ADHD (attention deficit hyperactivity disorder)    Diabetes mellitus    ESRD on hemodialysis ([place])    [facility]   High cholesterol    Hypertension    Hypertrophic cardiomyopathy ([place]) 02/26/2021   Seizure ([place]) 02/08/2021     Past Surgical History:  Procedure Laterality Date   AV FISTULA PLACEMENT Left 08/11/2020   Procedure: LEFT UPPER EXTREMITY ARTERIOVENOUS (AV) FISTULA CREATION;  Surgeon: Cherre Robins, MD;  Location: [facility];  Service: Vascular;  Laterality: Left;   FISTULA SUPERFICIALIZATION Left 2/83/1517   Procedure: PLICATION OF LEFT RADIUS CEPHALIC FISTULA;  Surgeon: Cherre Robins, MD;  Location: [facility];  Service: Vascular;  Laterality: Left;  PERIPHERAL NERVE BLOCK   FRACTURE SURGERY     I & D EXTREMITY Left 07/16/2014   Procedure: IRRIGATION AND DEBRIDEMENT EXTREMITY/LEFT INDEX FINGER;  Surgeon: Leanora Cover, MD;  Location: [facility];  Service: Orthopedics;  Laterality: Left;   IR PERC TUN PERIT CATH WO PORT S&I /IMAG  08/07/2020   IR US GUIDE VASC ACCESS RIGHT  08/07/2020       Latest Ref Rng & Units 12/28/2021    5:40 PM 12/02/2021    6:34 AM 10/31/2021    5:24 AM  CBC  WBC 4.0 - 10.5 K/uL 9.2   10.3   Hemoglobin 13.0 - 17.0 g/dL 11.1  11.6  7.5   Hematocrit 39.0 - 52.0 % 34.8  34.0  23.8   Platelets 150 - 400 K/uL 323   214        Latest Ref Rng & Units 12/28/2021    5:40 PM 12/02/2021    6:34 AM 10/31/2021    5:24 AM  BMP   Glucose 70 - 99 mg/dL 216  144  117   BUN 6 - 20 mg/dL 77  55  35   Creatinine 0.61 - 1.24 mg/dL 15.14  12.40  8.66   Sodium 135 - 145 mmol/L 139  137  138   Potassium 3.5 - 5.1 mmol/L 4.8  3.9  3.8   Chloride 98 - 111 mmol/L 101  95  97   CO2 22 - 32 mmol/L 17   26   Calcium 8.9 - 10.3 mg/dL 8.9   8.2      Objective:   Vitals:   12/29/21 0302 12/29/21 0613  BP: (!) 165/100 (!) 157/89  Pulse: 89 95  Resp: 17 18  Temp:    SpO2: 99% 98%    General:AA&O x 3. Normal mood and affect   Vascular: DP and PT pulses 2/4 bilateral. Brisk capillary refill to all digits. Pedal hair present   Neruological. Epicritic sensation grossly intact.   Derm: Right fifth digit wound with fibrotic necrotic  base measuring Serous drainage present Proximal phalanx visible with  probe to bone. Interspaces  macerations in first as well.   MSK: MMT 5/5 in dorsiflexion, plantar flexion, inversion and eversion. Normal joint  ROM without pain or crepitus.      Assessment & Plan:  Patient was evaluated and treated and all questions answered.  DX: Right fifth toe osteomyelitis Wound care: betadine, DSD  Antibiotics: Per primary  DME: Post-op shoe   Discussed with patient diagnosis and treatment options.  Imaging reviewed. Radiographs reveal break down of proximal fifth phalanx concerning for osteomyelitis  Discussed with patient treatment options. Discussed need for amputation of the fifth digit due to infection.  Will plan for OR this afternoon for right fifth digit amputation.  Continue NPO status.   Patient in agreement with plan and all questions answered.   Lorenda Peck, DPM  Accessible via secure chat for questions or concerns.

## 2021-12-29 NOTE — Progress Notes (Signed)
Pharmacy Antibiotic Note  [name] is a 36 y.o. male admitted on 12/28/2021 with  osteomyelitis/wound infection .  Pharmacy has been consulted for Vancomycin/Cefepime dosing. WBC WNL. ESRD on HD MWF per EDP note.   Plan: Vancomycin 2000 mg IV x 1, then 1000 mg IV qHD MWF Cefepime 1g IV q24h Trend WBC, temp, HD schedule F/U infectious work-up Drug levels as indicated   Height: 6' (182.9 cm) Weight: 86.6 kg (191 lb) IBW/kg (Calculated) : 77.6  Temp (24hrs), Avg:98.3 F (36.8 C), Min:98 F (36.7 C), Max:98.5 F (36.9 C)  Recent Labs  Lab 12/28/21 1740  WBC 9.2  CREATININE 15.14*    Estimated Creatinine Clearance: 7.4 mL/min (A) (by C-G formula based on SCr of 15.14 mg/dL (H)).    Allergies  Allergen Reactions   Amlodipine Nausea And Vomiting and Other (See Comments)    Patient was taking Amlodipine and Hydralazine at the same time, so the reactions came from one of the 2: Lethargy and an all-over feeling of NOT feeling well (also)   Hydralazine Nausea And Vomiting and Other (See Comments)    Patient was taking Hydralazine AND Amlodipine at the same time, so the reactions came from one of the 2: Lethargy and an all-over feeling of NOT feeling well (also)   Narda Bonds, PharmD, BCPS Clinical Pharmacist Phone: [phone]

## 2021-12-29 NOTE — Progress Notes (Signed)
Pt receives out-pt HD at [facility] on [facility]. Pt arrives at 10:15 for 10:30 chair time. Pt resides here in [place] but pt's insurance is not accepted by [employer]. Spoke to pt via phone. Pt states he plans to remain at [facility] and has transportation to appts. Clinic aware pt is hospitalized as well. Update provided to renal NP. Will assist as needed.   Melven Sartorius Renal Navigator [phone]

## 2021-12-29 NOTE — H&P (Signed)
History and Physical    [name] QMG:[id number] DOB: 1985/12/29 DOA: 12/28/2021  PCP: Nolene Ebbs, MD   Patient coming from: Home   Chief Complaint: Right foot ulcer   HPI: [name] is a pleasant 36 y.o. male with medical history significant for type 2 diabetes mellitus, hypertension, ESRD on hemodialysis, and chronic right foot wound, now presenting to the emergency department at the direction of his podiatrist for worsening right foot ulceration.  He has had a few weeks of increasing drainage and foul odor from the right fifth toe.  He had seen his podiatrist a month ago for this, was noted to have tinea pedis with secondary bacterial infection and was prescribed doxycycline and ketoconazole, but unfortunately never filled these prescriptions.  He was seen by podiatry again yesterday where it was noted that the ulceration involving the right fifth toe had progressed with exposure of the proximal phalanx.  He was directed to the emergency department in order to be admitted for IV antibiotics, imaging, and surgery.  Patient states that he does not have any sensation in the foot.  He denies fevers or chills.  Reports that he was last dialyzed on 12/26/2021.  ED Course: Upon arrival to the ED, patient is found to be afebrile, saturating well on room air, and hypertensive.  Chemistry panel notable for bicarbonate 17, anion gap 21, and BUN 77.  CBC with mild normocytic anemia and normal WBC.  CRP is elevated to 6.5 and ESR elevated to 85.  Plain radiographs of the foot demonstrate fracture through area of osteopenia in the distal aspect of the fifth proximal phalanx with dislocation of the fifth PIP joint and suspicion for osteomyelitis of the fifth proximal phalanx.  Blood culture was collected and the patient was treated with vancomycin and cefepime.  Review of Systems:  All other systems reviewed and apart from HPI, are negative.  Past Medical History:  Diagnosis Date   ADHD  (attention deficit hyperactivity disorder)    Diabetes mellitus    ESRD on hemodialysis ([place])    [facility]   High cholesterol    Hypertension    Hypertrophic cardiomyopathy ([place]) 02/26/2021   Seizure ([place]) 02/08/2021    Past Surgical History:  Procedure Laterality Date   AV FISTULA PLACEMENT Left 08/11/2020   Procedure: LEFT UPPER EXTREMITY ARTERIOVENOUS (AV) FISTULA CREATION;  Surgeon: Cherre Robins, MD;  Location: [facility];  Service: Vascular;  Laterality: Left;   FISTULA SUPERFICIALIZATION Left 10/05/7122   Procedure: PLICATION OF LEFT RADIUS CEPHALIC FISTULA;  Surgeon: Cherre Robins, MD;  Location: [facility];  Service: Vascular;  Laterality: Left;  PERIPHERAL NERVE BLOCK   FRACTURE SURGERY     I & D EXTREMITY Left 07/16/2014   Procedure: IRRIGATION AND DEBRIDEMENT EXTREMITY/LEFT INDEX FINGER;  Surgeon: Leanora Cover, MD;  Location: [facility];  Service: Orthopedics;  Laterality: Left;   IR PERC TUN PERIT CATH WO PORT S&I /IMAG  08/07/2020   IR US GUIDE VASC ACCESS RIGHT  08/07/2020    Social History:   reports that he has never smoked. He has never been exposed to tobacco smoke. He has never used smokeless tobacco. He reports that he does not currently use alcohol. He reports that he does not use drugs.  Allergies  Allergen Reactions   Amlodipine Nausea And Vomiting and Other (See Comments)    Patient was taking Amlodipine and Hydralazine at the same time, so the reactions came from one of the 2: Lethargy and  an all-over feeling of NOT feeling well (also)   Hydralazine Nausea And Vomiting and Other (See Comments)    Patient was taking Hydralazine AND Amlodipine at the same time, so the reactions came from one of the 2: Lethargy and an all-over feeling of NOT feeling well (also)    Family History  Problem Relation Age of Onset   Cancer Mother    Stroke Father    Diabetes Father    Hypertension Father      Prior to Admission medications   Medication Sig Start Date End  Date Taking? Authorizing Provider  acetaminophen (TYLENOL) 325 MG tablet Take 2 tablets (650 mg total) by mouth every 4 (four) hours as needed for headache or mild pain. 03/05/21   Samella Parr, NP  atorvastatin (LIPITOR) 40 MG tablet Take 40 mg by mouth daily.    [provider]  carvedilol (COREG) 25 MG tablet Take 1 tablet (25 mg total) by mouth 2 (two) times daily with a meal. 03/05/21   Samella Parr, NP  doxercalciferol ([name]) 4 MCG/2ML injection Inject 2.5 mLs (5 mcg total) into the vein every Monday, Wednesday, and Friday with hemodialysis. Patient not taking: Reported on 10/30/2021 03/06/21   Samella Parr, NP  doxycycline (VIBRA-TABS) 100 MG tablet Take 1 tablet (100 mg total) by mouth 2 (two) times daily. 11/30/21   McDonald, Stephan Minister, DPM  imipramine (TOFRANIL) 25 MG tablet Take 1 tablet (25 mg total) by mouth at bedtime. Patient not taking: Reported on 10/30/2021 03/05/21   Samella Parr, NP  insulin glargine-yfgn (SEMGLEE) 100 UNIT/ML injection Inject 0.16 mLs (16 Units total) into the skin at bedtime. Patient taking differently: Inject 22 Units into the skin every evening. 03/05/21   Samella Parr, NP  insulin lispro (HUMALOG) 100 UNIT/ML injection Inject 1-7 Units into the skin See admin instructions. Per sliding scale 3 times daily with meals 151-200= 1 unit 201-250= 2 units 251-300= 3 units 301-350= 5 units 351-400= 7 units Greater than 400 call md takes only as needed if blood sugar is over 200    [provider]  ketoconazole (NIZORAL) 2 % cream Apply 1 application. topically daily. 11/30/21   McDonald, Adam R, DPM  LEVEMIR FLEXTOUCH 100 UNIT/ML FlexTouch Pen Inject into the skin. 11/14/21   [provider]  LOKELMA 10 g PACK packet Take by mouth. 11/14/21   [provider]  losartan (COZAAR) 50 MG tablet Take 1 tablet (50 mg total) by mouth at bedtime. Patient not taking: Reported on 10/30/2021 03/05/21   Samella Parr, NP   Multiple Vitamins-Minerals (MULTIVITAMIN WITH MINERALS) tablet Take 1 tablet by mouth daily.    [provider]  multivitamin (RENA-VIT) TABS tablet Take 1 tablet by mouth at bedtime. Patient not taking: Reported on 10/30/2021 11/27/20   Lavina Hamman, MD  ondansetron (ZOFRAN) 4 MG tablet Take 4 mg by mouth every 8 (eight) hours as needed for nausea or vomiting. 04/12/21   [provider]  oxyCODONE-acetaminophen (PERCOCET/ROXICET) 5-325 MG tablet Take 1 tablet by mouth every 6 (six) hours as needed. 12/02/21   Ulyses Amor, PA-C  prednisoLONE acetate (PRED FORTE) 1 % ophthalmic suspension Place 1 drop into the left eye See admin instructions. 2-3 times daily 10/15/21   [provider]  sevelamer carbonate (RENVELA) 800 MG tablet Take 2 tablets (1,600 mg total) by mouth 3 (three) times daily with meals. 03/05/21   Samella Parr, NP  silver sulfADIAZINE (SILVADENE) 1 %  cream Apply 1 application. topically 2 (two) times daily as needed (breakout). 08/14/21   [provider]    Physical Exam: Vitals:   12/28/21 2022 12/28/21 2028 12/29/21 0030 12/29/21 0302  BP:  (!) 170/109 (!) 172/110 (!) 165/100  Pulse: 90 84 82 89  Resp:  17 17 17   Temp:   98 F (36.7 C)   TempSrc:      SpO2: 98% 98% 97% 99%  Weight:      Height:        Constitutional: NAD, calm  Eyes: PERTLA, lids and conjunctivae normal ENMT: Mucous membranes are moist. Posterior pharynx clear of any exudate or lesions.   Neck: supple, no masses  Respiratory: no wheezing, no crackles. No accessory muscle use.  Cardiovascular: S1 & S2 heard, regular rate and rhythm. No extremity edema.  Abdomen: No distension, no tenderness, soft. Bowel sounds active.  Musculoskeletal: no clubbing / cyanosis. No joint deformity upper and lower extremities.   Skin: Ulceration of right 5th toe with exposure of proximal phalanx; superficial ulceration of first interdigital space on the right foot. Warm, dry,  well-perfused. Neurologic: CN 2-12 grossly intact. Sensation diminished in b/l feet. Moving all extremities. Alert and oriented.  Psychiatric: Pleasant. Cooperative.    Labs and Imaging on Admission: I have personally reviewed following labs and imaging studies  CBC: Recent Labs  Lab 12/28/21 1740  WBC 9.2  NEUTROABS 7.2  HGB 11.1*  HCT 34.8*  MCV 85.5  PLT 323   Basic Metabolic Panel: Recent Labs  Lab 12/28/21 1740  NA 139  K 4.8  CL 101  CO2 17*  GLUCOSE 216*  BUN 77*  CREATININE 15.14*  CALCIUM 8.9   GFR: Estimated Creatinine Clearance: 7.4 mL/min (A) (by C-G formula based on SCr of 15.14 mg/dL (H)). Liver Function Tests: Recent Labs  Lab 12/28/21 1740  AST 10*  ALT 16  ALKPHOS 62  BILITOT 0.6  PROT 9.1*  ALBUMIN 3.7   No results for input(s): "LIPASE", "AMYLASE" in the last 168 hours. No results for input(s): "AMMONIA" in the last 168 hours. Coagulation Profile: No results for input(s): "INR", "PROTIME" in the last 168 hours. Cardiac Enzymes: No results for input(s): "CKTOTAL", "CKMB", "CKMBINDEX", "TROPONINI" in the last 168 hours. BNP (last 3 results) No results for input(s): "PROBNP" in the last 8760 hours. HbA1C: No results for input(s): "HGBA1C" in the last 72 hours. CBG: No results for input(s): "GLUCAP" in the last 168 hours. Lipid Profile: No results for input(s): "CHOL", "HDL", "LDLCALC", "TRIG", "CHOLHDL", "LDLDIRECT" in the last 72 hours. Thyroid Function Tests: No results for input(s): "TSH", "T4TOTAL", "FREET4", "T3FREE", "THYROIDAB" in the last 72 hours. Anemia Panel: No results for input(s): "VITAMINB12", "FOLATE", "FERRITIN", "TIBC", "IRON", "RETICCTPCT" in the last 72 hours. Urine analysis:    Component Value Date/Time   COLORURINE YELLOW 11/25/2020 0301   APPEARANCEUR CLEAR 11/25/2020 0301   LABSPEC 1.016 11/25/2020 0301   PHURINE 7.0 11/25/2020 0301   GLUCOSEU 50 (A) 11/25/2020 0301   HGBUR SMALL (A) 11/25/2020 0301    BILIRUBINUR NEGATIVE 11/25/2020 0301   KETONESUR NEGATIVE 11/25/2020 0301   PROTEINUR >=300 (A) 11/25/2020 0301   UROBILINOGEN 0.2 07/16/2014 1747   NITRITE NEGATIVE 11/25/2020 0301   LEUKOCYTESUR NEGATIVE 11/25/2020 0301   Sepsis Labs: @LABRCNTIP (procalcitonin:4,lacticidven:4) )No results found for this or any previous visit (from the past 240 hour(s)).   Radiological Exams on Admission: DG Foot Complete Right  Result Date: 12/28/2021 CLINICAL DATA:  Assess for osteomyelitis of the  fifth toe. Ulceration of the left fifth toe for six-months. EXAM: RIGHT FOOT COMPLETE - 3+ VIEW COMPARISON:  Nov 30, 2021 FINDINGS: There is generalized osteopenia of the mid to distal fifth proximal phalanx with fracture through the area of osteopenia in the distal aspect of the fifth proximal phalanx. There is dislocation of the fifth proximal interphalangeal joint. IMPRESSION: Fracture through the area of osteopenia in the distal aspect of the fifth proximal phalanx with dislocation of the fifth proximal interphalangeal joint. No other fracture identified. Generalized osteopenia of the mid to distal aspect of the fifth proximal phalanx, suspicious for osteomyelitis. Electronically Signed   By: Abelardo Diesel M.D.   On: 12/28/2021 18:16    Assessment/Plan   1. 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 ESRD  - Pt reports he was last dialyzed 12/26/21  - Restrict fluids, renally-dose medications, consult nephrology in am for dialysis   3. Insulin-dependent DM  - A1c was 6.2% in April 2023  - Continue CBG checks  and insulin   4. Hypertensive urgency  - Severe asymptomatic HTN in ED with BP 190/110  - Anticipate improvement with dialysis, continue Coreg with dose now     DVT prophylaxis: SCDs  Code Status: Full  Level of Care: Level of care: Med-Surg Family Communication: None present  Disposition Plan:  Patient is from: home  Anticipated d/c is to: Home  Anticipated d/c date is: 01/01/22  Patient currently: Pending podiatry consultation and likely surgery; will need inpatient dialysis  Consults called: none  Admission status: Inpatient     Vianne Bulls, MD [facility]  12/29/2021, 6:00 AM

## 2021-12-29 NOTE — Consult Note (Addendum)
[facility] Renal Consultation Note    Indication for Consultation:  Management of ESRD/hemodialysis; anemia, hypertension/volume and secondary hyperparathyroidism PCP: Dr. Revonda Humphrey  HPI: [name] is a 36 y.o. male with ESRD on hemodialysis T,Th, S at [facility]. PMH: DMT2, HTN, anemia of ESRD, SHPT who presents to ED after being told by podiatrist for evaluation of ulceration with exposure of proximal phalanx of 5th R toe.   Xray R foots with Generalized osteopenia of the mid to distal aspect of the fifth proximal phalanx, suspicious for osteomyelitis. T 98.5 BP 189/110 HR 100 R 19. SCr 15.14 BUN 77 Co2 17 Na 139 K+ 4.8 BS 216 WBC 9.2 HGB 11.1 PLT 323. He has been admitted for R 5th toe osteomyelitis. He has been seen by podiatry. Plans for amputation of 5th R toe this afternoon.  Seen in ED. No real complaints other than being tired. Says he is now living in [place] but being transported to [place] for dialysis. He denies fever, chills, SOB, N,V,D. Says he is just tired.   Past Medical History:  Diagnosis Date   ADHD (attention deficit hyperactivity disorder)    Diabetes mellitus    ESRD on hemodialysis ([place])    [facility]   High cholesterol    Hypertension    Hypertrophic cardiomyopathy ([place]) 02/26/2021   Seizure ([place]) 02/08/2021   Past Surgical History:  Procedure Laterality Date   AV FISTULA PLACEMENT Left 08/11/2020   Procedure: LEFT UPPER EXTREMITY ARTERIOVENOUS (AV) FISTULA CREATION;  Surgeon: Cherre Robins, MD;  Location: [place];  Service: Vascular;  Laterality: Left;   FISTULA SUPERFICIALIZATION Left 8/93/8101   Procedure: PLICATION OF LEFT RADIUS CEPHALIC FISTULA;  Surgeon: Cherre Robins, MD;  Location: [place];  Service: Vascular;  Laterality: Left;  PERIPHERAL NERVE BLOCK   FRACTURE SURGERY     I & D EXTREMITY Left 07/16/2014   Procedure: IRRIGATION AND DEBRIDEMENT EXTREMITY/LEFT INDEX FINGER;  Surgeon: Leanora Cover, MD;   Location: [place];  Service: Orthopedics;  Laterality: Left;   IR PERC TUN PERIT CATH WO PORT S&I /IMAG  08/07/2020   IR US GUIDE VASC ACCESS RIGHT  08/07/2020   Family History  Problem Relation Age of Onset   Cancer Mother    Stroke Father    Diabetes Father    Hypertension Father    Social History:  reports that he has never smoked. He has never been exposed to tobacco smoke. He has never used smokeless tobacco. He reports that he does not currently use alcohol. He reports that he does not use drugs. Allergies  Allergen Reactions   Amlodipine Nausea And Vomiting and Other (See Comments)    Patient was taking Amlodipine and Hydralazine at the same time, so the reactions came from one of the 2: Lethargy and an all-over feeling of NOT feeling well (also)   Hydralazine Nausea And Vomiting and Other (See Comments)    Patient was taking Hydralazine AND Amlodipine at the same time, so the reactions came from one of the 2: Lethargy and an all-over feeling of NOT feeling well (also)   Prior to Admission medications   Medication Sig Start Date End Date Taking? Authorizing Provider  acetaminophen (TYLENOL) 325 MG tablet Take 2 tablets (650 mg total) by mouth every 4 (four) hours as needed for headache or mild pain. 03/05/21  Yes Samella Parr, NP  atorvastatin (LIPITOR) 40 MG tablet Take 40 mg by mouth daily.   Yes [provider]  carvedilol (COREG) 25 MG tablet Take 1 tablet (25 mg total) by mouth 2 (two) times daily with a meal. 03/05/21  Yes Samella Parr, NP  insulin glargine-yfgn (SEMGLEE) 100 UNIT/ML injection Inject 0.16 mLs (16 Units total) into the skin at bedtime. Patient taking differently: Inject 22 Units into the skin every evening. 03/05/21  Yes Samella Parr, NP  insulin lispro (HUMALOG) 100 UNIT/ML injection Inject 1-7 Units into the skin See admin instructions. Per sliding scale 3 times daily with meals 151-200= 1 unit 201-250= 2 units 251-300= 3 units 301-350= 5  units 351-400= 7 units Greater than 400 call md takes only as needed if blood sugar is over 200   Yes [provider]  Multiple Vitamins-Minerals (MULTIVITAMIN WITH MINERALS) tablet Take 1 tablet by mouth daily.   Yes [provider]  ondansetron (ZOFRAN) 4 MG tablet Take 4 mg by mouth every 8 (eight) hours as needed for nausea or vomiting. 04/12/21  Yes [provider]  prednisoLONE acetate (PRED FORTE) 1 % ophthalmic suspension Place 1 drop into the left eye See admin instructions. 2-3 times daily 10/15/21  Yes [provider]  doxercalciferol ([name]) 4 MCG/2ML injection Inject 2.5 mLs (5 mcg total) into the vein every Monday, Wednesday, and Friday with hemodialysis. Patient not taking: Reported on 10/30/2021 03/06/21   Samella Parr, NP  doxycycline (VIBRA-TABS) 100 MG tablet Take 1 tablet (100 mg total) by mouth 2 (two) times daily. Patient not taking: Reported on 12/29/2021 11/30/21   Criselda Peaches, DPM  imipramine (TOFRANIL) 25 MG tablet Take 1 tablet (25 mg total) by mouth at bedtime. Patient not taking: Reported on 10/30/2021 03/05/21   Samella Parr, NP  ketoconazole (NIZORAL) 2 % cream Apply 1 application. topically daily. Patient not taking: Reported on 12/29/2021 11/30/21   Criselda Peaches, DPM  losartan (COZAAR) 50 MG tablet Take 1 tablet (50 mg total) by mouth at bedtime. Patient not taking: Reported on 10/30/2021 03/05/21   Samella Parr, NP  multivitamin (RENA-VIT) TABS tablet Take 1 tablet by mouth at bedtime. Patient not taking: Reported on 10/30/2021 11/27/20   Lavina Hamman, MD  oxyCODONE-acetaminophen (PERCOCET/ROXICET) 5-325 MG tablet Take 1 tablet by mouth every 6 (six) hours as needed. Patient not taking: Reported on 12/29/2021 12/02/21   Ulyses Amor, PA-C  sevelamer carbonate (RENVELA) 800 MG tablet Take 2 tablets (1,600 mg total) by mouth 3 (three) times daily with meals. Patient not taking: Reported on 12/29/2021 03/05/21   Samella Parr, NP   Current Facility-Administered Medications  Medication Dose Route Frequency Provider Last Rate Last Admin   acetaminophen (TYLENOL) tablet 650 mg  650 mg Oral Q6H PRN Opyd, Ilene Qua, MD       Or   acetaminophen (TYLENOL) suppository 650 mg  650 mg Rectal Q6H PRN Opyd, Ilene Qua, MD       atorvastatin (LIPITOR) tablet 40 mg  40 mg Oral Daily Opyd, Ilene Qua, MD   40 mg at 12/29/21 0952   carvedilol (COREG) tablet 25 mg  25 mg Oral BID WC Opyd, Ilene Qua, MD   25 mg at 12/29/21 0634   ceFEPIme (MAXIPIME) 1 g in sodium chloride 0.9 % 100 mL IVPB  1 g Intravenous Q24H Opyd, Ilene Qua, MD       Chlorhexidine Gluconate Cloth 2 % PADS 6 each  6 each Topical Q0600 Valentina Gu, NP       insulin aspart (novoLOG) injection 0-6  Units  0-6 Units Subcutaneous Q4H Opyd, Ilene Qua, MD   1 Units at 12/29/21 0634   insulin glargine-yfgn (SEMGLEE) injection 8 Units  8 Units Subcutaneous QHS Opyd, Ilene Qua, MD       metroNIDAZOLE (FLAGYL) tablet 500 mg  500 mg Oral Q12H Opyd, Ilene Qua, MD   500 mg at 12/29/21 5732   sevelamer carbonate (RENVELA) tablet 1,600 mg  1,600 mg Oral TID WC Opyd, Ilene Qua, MD   1,600 mg at 12/29/21 1150   vancomycin (VANCOCIN) IVPB 1000 mg/200 mL premix  1,000 mg Intravenous Q T,Th,Sa-HD Paytes, Austin A, RPH 200 mL/hr at 12/29/21 1149 1,000 mg at 12/29/21 1149   Current Outpatient Medications  Medication Sig Dispense Refill   acetaminophen (TYLENOL) 325 MG tablet Take 2 tablets (650 mg total) by mouth every 4 (four) hours as needed for headache or mild pain.     atorvastatin (LIPITOR) 40 MG tablet Take 40 mg by mouth daily.     carvedilol (COREG) 25 MG tablet Take 1 tablet (25 mg total) by mouth 2 (two) times daily with a meal.     insulin glargine-yfgn (SEMGLEE) 100 UNIT/ML injection Inject 0.16 mLs (16 Units total) into the skin at bedtime. (Patient taking differently: Inject 22 Units into the skin every evening.) 10 mL 11   insulin lispro (HUMALOG) 100  UNIT/ML injection Inject 1-7 Units into the skin See admin instructions. Per sliding scale 3 times daily with meals 151-200= 1 unit 201-250= 2 units 251-300= 3 units 301-350= 5 units 351-400= 7 units Greater than 400 call md takes only as needed if blood sugar is over 200     Multiple Vitamins-Minerals (MULTIVITAMIN WITH MINERALS) tablet Take 1 tablet by mouth daily.     ondansetron (ZOFRAN) 4 MG tablet Take 4 mg by mouth every 8 (eight) hours as needed for nausea or vomiting.     prednisoLONE acetate (PRED FORTE) 1 % ophthalmic suspension Place 1 drop into the left eye See admin instructions. 2-3 times daily     doxercalciferol ([name]) 4 MCG/2ML injection Inject 2.5 mLs (5 mcg total) into the vein every Monday, Wednesday, and Friday with hemodialysis. (Patient not taking: Reported on 10/30/2021) 2 mL    doxycycline (VIBRA-TABS) 100 MG tablet Take 1 tablet (100 mg total) by mouth 2 (two) times daily. (Patient not taking: Reported on 12/29/2021) 20 tablet 0   imipramine (TOFRANIL) 25 MG tablet Take 1 tablet (25 mg total) by mouth at bedtime. (Patient not taking: Reported on 10/30/2021)  3   ketoconazole (NIZORAL) 2 % cream Apply 1 application. topically daily. (Patient not taking: Reported on 12/29/2021) 60 g 2   losartan (COZAAR) 50 MG tablet Take 1 tablet (50 mg total) by mouth at bedtime. (Patient not taking: Reported on 10/30/2021)     multivitamin (RENA-VIT) TABS tablet Take 1 tablet by mouth at bedtime. (Patient not taking: Reported on 10/30/2021) 30 tablet 0   oxyCODONE-acetaminophen (PERCOCET/ROXICET) 5-325 MG tablet Take 1 tablet by mouth every 6 (six) hours as needed. (Patient not taking: Reported on 12/29/2021) 20 tablet 0   sevelamer carbonate (RENVELA) 800 MG tablet Take 2 tablets (1,600 mg total) by mouth 3 (three) times daily with meals. (Patient not taking: Reported on 12/29/2021)     Labs: Basic Metabolic Panel: Recent Labs  Lab 12/28/21 1740  NA 139  K 4.8  CL 101  CO2 17*   GLUCOSE 216*  BUN 77*  CREATININE 15.14*  CALCIUM 8.9   Liver Function Tests:  Recent Labs  Lab 12/28/21 1740  AST 10*  ALT 16  ALKPHOS 62  BILITOT 0.6  PROT 9.1*  ALBUMIN 3.7   No results for input(s): "LIPASE", "AMYLASE" in the last 168 hours. No results for input(s): "AMMONIA" in the last 168 hours. CBC: Recent Labs  Lab 12/28/21 1740  WBC 9.2  NEUTROABS 7.2  HGB 11.1*  HCT 34.8*  MCV 85.5  PLT 323   Cardiac Enzymes: No results for input(s): "CKTOTAL", "CKMB", "CKMBINDEX", "TROPONINI" in the last 168 hours. CBG: Recent Labs  Lab 12/29/21 0627 12/29/21 1152  GLUCAP 163* 132*   Iron Studies: No results for input(s): "IRON", "TIBC", "TRANSFERRIN", "FERRITIN" in the last 72 hours. Studies/Results: DG Foot Complete Right  Result Date: 12/28/2021 CLINICAL DATA:  Assess for osteomyelitis of the fifth toe. Ulceration of the left fifth toe for six-months. EXAM: RIGHT FOOT COMPLETE - 3+ VIEW COMPARISON:  Nov 30, 2021 FINDINGS: There is generalized osteopenia of the mid to distal fifth proximal phalanx with fracture through the area of osteopenia in the distal aspect of the fifth proximal phalanx. There is dislocation of the fifth proximal interphalangeal joint. IMPRESSION: Fracture through the area of osteopenia in the distal aspect of the fifth proximal phalanx with dislocation of the fifth proximal interphalangeal joint. No other fracture identified. Generalized osteopenia of the mid to distal aspect of the fifth proximal phalanx, suspicious for osteomyelitis. Electronically Signed   By: Abelardo Diesel M.D.   On: 12/28/2021 18:16    ROS: As per HPI otherwise negative.  Physical Exam: Vitals:   12/29/21 0302 12/29/21 0613 12/29/21 0953 12/29/21 1119  BP: (!) 165/100 (!) 157/89 (!) 159/101 (!) 178/99  Pulse: 89 95 93 90  Resp: 17 18 15 15   Temp:   98.1 F (36.7 C) 98 F (36.7 C)  TempSrc:   Oral Oral  SpO2: 99% 98% 100% 100%  Weight:      Height:         General:   Chronically ill male in no acute distress. Head: Normocephalic, atraumatic, sclera non-icteric, mucus membranes are moist Neck: Supple. JVD not elevated. Lungs: Clear bilaterally to auscultation without wheezes, rales, or rhonchi. Breathing is unlabored. Heart: RRR with S1 S2. No murmurs, rubs, or gallops appreciated. Abdomen: Soft, non-tender, non-distended with normoactive bowel sounds. No rebound/guarding. No obvious abdominal masses. M-S:  Strength and tone appear normal for age. Lower extremities:No LE edema wound R 5th toe with foul odor.  Neuro: Alert and oriented X 3. Moves all extremities spontaneously. Psych:  Responds to questions appropriately with a normal affect. Dialysis Access: LUA AVF suture intact lower portion of AVF. Has been using AVF, sticking above area. + T/B.   Dialysis Orders: Center: [facility] 4 hours 450/500 86 kg 1.0 K/2.5 Ca AVF -No heparin -Venofer 50 mg IV weekly -Mircera 150 mcg IV q 2 weeks -Calcitriol 1 mcg PO TIW  Assessment/Plan:  Osteomyelitis R 5th toe-seen by podiatry. Plans to amputate today. ABX per primary.   ESRD -  T,Th,S. HD today. Will schedule around OR time. No heparin.   Hypertension/volume  - No evidence of excess volume by exam but slightly hypertensive. UF as tolerated. Resume carvedilol when appropriate.   Anemia  - HGB 11.1. Follow HGB  Metabolic bone disease -  No binders on home medication list. Continue VDRA.  Nutrition - NPO at present DMT2-per primary  [place]. Owens Shark, NP-C 12/29/2021, 2:00 PM  [employer] [phone]   Seen and examined independently.  Agree with note and exam as documented above by physician extender and as noted here.  Patient with end-stage renal disease on hemodialysis Tuesday Thursday Saturday who presented to the hospital for evaluation of diabetic foot ulcer.  He was seen outpatient by podiatry on 6/19 and they recommended admission for antibiotics and possible  amputation.  He states that a sore on his foot "got bad really fast".  He has been dialyzing at [facility] and got his last dialysis on Saturday.  He is now commuting there but states that he likes that unit and does not want to change anything at this time.  General adult male in bed in no acute distress HEENT normocephalic atraumatic extraocular movements intact sclera anicteric Neck supple trachea midline Lungs clear to auscultation bilaterally normal work of breathing at rest  Heart S1S2 no rub Abdomen soft nontender nondistended Extremities no edema.  Wearing socks currently Psych normal mood and affect Neuro - alert and oriented x 3 provides hx and follows commands  Access: LUE AVF bruit and thrill with sutures lower aspect of AVF  # Osteomyelitis right 5th toe  - abx per primary team and note podiatry plans to amputate the toe  # ESRD - HD today per TTS schedule  # HTN  - optimize volume with HD and resume home meds  # Anemia CKD  - no indication for ESA currently - last ESA dose not available  # metabolic bone disease  - renvela is ordered; continue activated vit D  Claudia Desanctis, MD 12/29/2021 6:25 PM

## 2021-12-29 NOTE — Transfer of Care (Signed)
Immediate Anesthesia Transfer of Care Note  Patient: [name]  Procedure(s) Performed: AMPUTATION TOE, fifth (Right: Toe)  Patient Location: PACU  Anesthesia Type:MAC  Level of Consciousness: awake, alert  and oriented  Airway & Oxygen Therapy: Patient Spontanous Breathing and Patient connected to face mask oxygen  Post-op Assessment: Report given to RN and Post -op Vital signs reviewed and stable  Post vital signs: Reviewed and stable  Last Vitals:  Vitals Value Taken Time  BP 147/85 12/29/21 1955  Temp 37 C 12/29/21 1910  Pulse 69 12/29/21 2003  Resp 15 12/29/21 2003  SpO2 98 % 12/29/21 2003  Vitals shown include unvalidated device data.  Last Pain:  Vitals:   12/29/21 1955  TempSrc:   PainSc: 0-No pain         Complications: No notable events documented.

## 2021-12-29 NOTE — ED Notes (Signed)
CBG 102 

## 2021-12-29 NOTE — ED Provider Notes (Signed)
[facility] EMERGENCY DEPARTMENT Provider Note   CSN: [id number] Arrival date & time: 12/28/21  1720    History  Chief Complaint  Patient presents with   Foot Pain    [name] is a 36 y.o. male hx ESRD MWF, DM, HCOM here for evaluation of diabetic ulcer. Has noted over the last few weeks worsening right little toe with drainage and odor x weeks. Seen by podiatry, rx'ed abx however did not fill. Progressively worsening. Seen by Podiatry today who rec admission for abx, MR foot with possible amputation. No fever, emesis, numbness, weakness. Last dialysis today, full session.    [facility]> Criselda Peaches, DPM  Plan:  "Patient was evaluated and treated and all questions answered.   Unfortunately he has progressed to a full ulceration with exposure of the proximal phalanx of the fifth toe.  I expect he likely will need amputation of the toe at this point.  His foot is edematous and warm and has malodorous drainage.  I recommended he proceed to the emergency room with a diabetic foot infection and plan for surgery I notified our on-call provider Dr. Blenda Mounts, I recommend on admission he have IV antibiotics and an MRI of the right foot. No follow-ups on file. "   HPI     Home Medications Prior to Admission medications   Medication Sig Start Date End Date Taking? Authorizing Provider  acetaminophen (TYLENOL) 325 MG tablet Take 2 tablets (650 mg total) by mouth every 4 (four) hours as needed for headache or mild pain. 03/05/21   Samella Parr, NP  atorvastatin (LIPITOR) 40 MG tablet Take 40 mg by mouth daily.    [provider]  carvedilol (COREG) 25 MG tablet Take 1 tablet (25 mg total) by mouth 2 (two) times daily with a meal. 03/05/21   Samella Parr, NP  doxercalciferol ([name]) 4 MCG/2ML injection Inject 2.5 mLs (5 mcg total) into the vein every Monday, Wednesday, and Friday with hemodialysis. Patient not taking: Reported on  10/30/2021 03/06/21   Samella Parr, NP  doxycycline (VIBRA-TABS) 100 MG tablet Take 1 tablet (100 mg total) by mouth 2 (two) times daily. 11/30/21   McDonald, Stephan Minister, DPM  imipramine (TOFRANIL) 25 MG tablet Take 1 tablet (25 mg total) by mouth at bedtime. Patient not taking: Reported on 10/30/2021 03/05/21   Samella Parr, NP  insulin glargine-yfgn (SEMGLEE) 100 UNIT/ML injection Inject 0.16 mLs (16 Units total) into the skin at bedtime. Patient taking differently: Inject 22 Units into the skin every evening. 03/05/21   Samella Parr, NP  insulin lispro (HUMALOG) 100 UNIT/ML injection Inject 1-7 Units into the skin See admin instructions. Per sliding scale 3 times daily with meals 151-200= 1 unit 201-250= 2 units 251-300= 3 units 301-350= 5 units 351-400= 7 units Greater than 400 call md takes only as needed if blood sugar is over 200    [provider]  ketoconazole (NIZORAL) 2 % cream Apply 1 application. topically daily. 11/30/21   McDonald, Adam R, DPM  LEVEMIR FLEXTOUCH 100 UNIT/ML FlexTouch Pen Inject into the skin. 11/14/21   [provider]  LOKELMA 10 g PACK packet Take by mouth. 11/14/21   [provider]  losartan (COZAAR) 50 MG tablet Take 1 tablet (50 mg total) by mouth at bedtime. Patient not taking: Reported on 10/30/2021 03/05/21   Samella Parr, NP  Multiple Vitamins-Minerals (MULTIVITAMIN WITH MINERALS) tablet Take 1 tablet by mouth  daily.    [provider]  multivitamin (RENA-VIT) TABS tablet Take 1 tablet by mouth at bedtime. Patient not taking: Reported on 10/30/2021 11/27/20   Lavina Hamman, MD  ondansetron (ZOFRAN) 4 MG tablet Take 4 mg by mouth every 8 (eight) hours as needed for nausea or vomiting. 04/12/21   [provider]  oxyCODONE-acetaminophen (PERCOCET/ROXICET) 5-325 MG tablet Take 1 tablet by mouth every 6 (six) hours as needed. 12/02/21   Ulyses Amor, PA-C  prednisoLONE acetate (PRED FORTE) 1 % ophthalmic  suspension Place 1 drop into the left eye See admin instructions. 2-3 times daily 10/15/21   [provider]  sevelamer carbonate (RENVELA) 800 MG tablet Take 2 tablets (1,600 mg total) by mouth 3 (three) times daily with meals. 03/05/21   Samella Parr, NP  silver sulfADIAZINE (SILVADENE) 1 % cream Apply 1 application. topically 2 (two) times daily as needed (breakout). 08/14/21   [provider]      Allergies    Amlodipine and Hydralazine    Review of Systems   Review of Systems  Constitutional: Negative.   HENT: Negative.    Respiratory: Negative.    Cardiovascular: Negative.   Gastrointestinal: Negative.   Genitourinary: Negative.   Musculoskeletal: Negative.        Right little toe pain  Skin:  Positive for wound.  Neurological: Negative.   All other systems reviewed and are negative.   Physical Exam Updated Vital Signs BP (!) 165/100   Pulse 89   Temp 98 F (36.7 C)   Resp 17   Ht 6' (1.829 m)   Wt 86.6 kg   SpO2 99%   BMI 25.90 kg/m  Physical Exam Vitals and nursing note reviewed.  Constitutional:      General: He is not in acute distress.    Appearance: He is well-developed. He is not ill-appearing, toxic-appearing or diaphoretic.  HENT:     Head: Normocephalic and atraumatic.  Eyes:     Pupils: Pupils are equal, round, and reactive to light.  Cardiovascular:     Rate and Rhythm: Normal rate and regular rhythm.     Pulses: Normal pulses.          Dorsalis pedis pulses are 2+ on the right side and 2+ on the left side.     Heart sounds: Normal heart sounds.  Pulmonary:     Effort: Pulmonary effort is normal. No respiratory distress.     Breath sounds: Normal breath sounds.  Abdominal:     General: Bowel sounds are normal. There is no distension.     Palpations: Abdomen is soft.  Musculoskeletal:        General: Normal range of motion.     Cervical back: Normal range of motion and neck supple.  Skin:    General: Skin is warm and dry.      Findings: Lesion and wound present.     Comments: Full thickness ulceration between 4/5 MC with malodorous drainage. Edema and erythema to right foot  Neurological:     General: No focal deficit present.     Mental Status: He is alert and oriented to person, place, and time.      ED Results / Procedures / Treatments   Labs (all labs ordered are listed, but only abnormal results are displayed) Labs Reviewed  COMPREHENSIVE METABOLIC PANEL - Abnormal; Notable for the following components:      Result Value   CO2 17 (*)    Glucose,  Bld 216 (*)    BUN 77 (*)    Creatinine, Ser 15.14 (*)    Total Protein 9.1 (*)    AST 10 (*)    GFR, Estimated 4 (*)    Anion gap 21 (*)    All other components within normal limits  CBC WITH DIFFERENTIAL/PLATELET - Abnormal; Notable for the following components:   RBC 4.07 (*)    Hemoglobin 11.1 (*)    HCT 34.8 (*)    RDW 16.3 (*)    All other components within normal limits  SEDIMENTATION RATE - Abnormal; Notable for the following components:   Sed Rate 85 (*)    All other components within normal limits  C-REACTIVE PROTEIN - Abnormal; Notable for the following components:   CRP 6.5 (*)    All other components within normal limits  CULTURE, BLOOD (ROUTINE X 2)  CULTURE, BLOOD (ROUTINE X 2)  LACTIC ACID, PLASMA  CBG MONITORING, ED    EKG None  Radiology DG Foot Complete Right  Result Date: 12/28/2021 CLINICAL DATA:  Assess for osteomyelitis of the fifth toe. Ulceration of the left fifth toe for six-months. EXAM: RIGHT FOOT COMPLETE - 3+ VIEW COMPARISON:  Nov 30, 2021 FINDINGS: There is generalized osteopenia of the mid to distal fifth proximal phalanx with fracture through the area of osteopenia in the distal aspect of the fifth proximal phalanx. There is dislocation of the fifth proximal interphalangeal joint. IMPRESSION: Fracture through the area of osteopenia in the distal aspect of the fifth proximal phalanx with dislocation of the fifth  proximal interphalangeal joint. No other fracture identified. Generalized osteopenia of the mid to distal aspect of the fifth proximal phalanx, suspicious for osteomyelitis. Electronically Signed   By: Abelardo Diesel M.D.   On: 12/28/2021 18:16    Procedures .Critical Care  Performed by: Nettie Elm, PA-C Authorized by: Nettie Elm, PA-C   Critical care provider statement:    Critical care time (minutes):  35   Critical care was necessary to treat or prevent imminent or life-threatening deterioration of the following conditions:  Endocrine crisis (osteomylitis requiring amputation)   Critical care was time spent personally by me on the following activities:  Development of treatment plan with patient or surrogate, discussions with consultants, evaluation of patient's response to treatment, examination of patient, ordering and review of laboratory studies, ordering and review of radiographic studies, ordering and performing treatments and interventions, pulse oximetry, re-evaluation of patient's condition and review of old charts     Medications Ordered in ED Medications  vancomycin (VANCOREADY) IVPB 2000 mg/400 mL (has no administration in time range)  ceFEPIme (MAXIPIME) 2 g in sodium chloride 0.9 % 100 mL IVPB (has no administration in time range)    ED Course/ Medical Decision Making/ A&P     36 year old diabetic, ESRD patient last full session today here for evaluation of wound to right fifth digit.  Has been ongoing for weeks.  Initially seen by podiatry, prescribed antibiotics however he did not fill this.  Was seen today due to worsening wound, malodorous drainage and swelling to the foot.  Podiatry recommended admission for IV antibiotics, MRI foot and possible amputation.  Patient denies any systemic symptoms.  On exam patient has obvious gangrenous full-thickness wound to right little toe.  He has good pulses and is neurovascular intact I suspect this is a diabetic  infection.  Will provide antibiotics and get labs for admission   Labs and imaging personally viewed and interpreted:  CBC without leukocytosis, hemoglobin 16.9 Metabolic panel creatinine 15.4, ESRD patient, glucose 216 Xray Right foot with osteomyelitis right fifth digit CRP 6.5 Sed rate 85  Patient given IV antibiotics for his osteomyelitis.  Will be admitted for further management  CONSULT with Dr. Myna Hidalgo with [facility] who agrees to evaluate patient for admission.  The patient appears reasonably stabilized for admission considering the current resources, flow, and capabilities available in the ED at this time, and I doubt any other [facility] requiring further screening and/or treatment in the ED prior to admission.                           Medical Decision Making Amount and/or Complexity of Data Reviewed External Data Reviewed: labs, radiology and notes. Labs: ordered. Decision-making details documented in ED Course. Radiology: ordered and independent interpretation performed. Decision-making details documented in ED Course.  Risk OTC drugs. Parenteral controlled substances. Decision regarding hospitalization. Diagnosis or treatment significantly limited by social determinants of health. Emergency major surgery.         Final Clinical Impression(s) / ED Diagnoses Final diagnoses:  Osteomyelitis of right foot, unspecified type ([place])  ESRD (end stage renal disease) ([place])  Hyperglycemia    Rx / DC Orders ED Discharge Orders     None         Corey Caulfield A, PA-C 12/29/21 0454    Margette Fast, MD 12/29/21 [phone]

## 2021-12-29 NOTE — Progress Notes (Signed)
PROGRESS NOTE  [name]  DOB: 20-Feb-1986  PCP: Nolene Ebbs, MD TOI:[id number]  DOA: 12/28/2021  LOS: 0 days  Hospital Day: 2  Brief narrative: [name] is a 36 y.o. male with PMH significant for ESRD-HD-TTS, DM 2, HTN, chronic right foot wound. Patient has had few weeks of grossly worsening foul-smelling drainage from his right fifth toe ulcer.  He was seen by podiatrist a month ago, noted to have tinea pedis as well, prescribed doxycycline and ketoconazole.  Patient unfortunately never filled this prescription. Patient followed up with podiatrist Dr. Sherryle Lis on 6/19 and was noted to have progressive worsening of the wound.  He was sent to the ED for MRI, IV antibiotics and potential need of surgery.  In the ED, patient was afebrile, blood pressure elevated to 189/110 Labs with WC count normal, hemoglobin 11.6, BUN/creatinine elevated as expected in dialysis, CRP elevated to 6.5, sed rate elevated to 85, blood sugar level elevated to 216 X-ray of the right foot showed fracture through area of osteopenia in the distal aspect of the fifth proximal phalanx with dislocation of the fifth PIP joint and suspicion for osteomyelitis of the fifth proximal phalanx.  Blood culture was collected and the patient was treated with vancomycin and cefepime. Admitted to hospital service Podiatry consultation was obtained   Subjective: Patient was seen and examined this morning.  Pleasant young African-American male.  Not in distress.  No new symptoms. Chart reviewed blood pressure in 150s this morning  Principal Problem:   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) Active Problems:   Type 2 diabetes mellitus with ESRD (end-stage renal disease) ([place])   Hypertensive urgency   Metabolic acidosis   ESRD (end stage renal disease) on dialysis (HCC)    Assessment and plan: Right foot diabetic wound and ulcer Osteomyelitis of the 5th proximal phalanx  -Not septic.  Currently on IV  antibiotics -Podiatry plans for right fifth digit amputation this afternoon  -He had normal toe-brachial indices bilaterally on vascular US from May 2022  -Follow blood cultures sent from ED.   ESRD-HD-TTS -Pt reports he was last dialyzed 12/26/21  -Nephrology consulted  Hypertensive urgency -Presented with blood pressure severely elevated to 190 -PTA on Coreg 25 mg twice daily  Type 2 diabetes mellitus -A1c six-point 08/01/2021 -PTA on Semglee 60 units at bedtime, sliding scale insulin -Currently on Semglee 18 units daily, sliding scale insulin with Accu-Cheks -Blood sugar level Recent Labs  Lab 12/29/21 0627  GLUCAP 163*   Hyperlipidemia -Statin  Goals of care   Code Status: Full Code    Mobility: Encourage ambulation  Skin assessment:     Nutritional status:  Body mass index is 25.9 kg/m.          Diet: N.p.o. at this time for surgery this afternoon.  Renal diet after that Diet Order             Diet NPO time specified Except for: Ice Chips, Sips with Meds  Diet effective now                   DVT prophylaxis:  SCDs Start: 12/29/21 0600   Antimicrobials: IV cefepime and vancomycin Fluid: None Consultants: Podiatry, nephrology Family Communication: None at bedside  Status is: Inpatient  Continue in-hospital care because: Pending surgery today Level of care: Med-Surg   Dispo: The patient is from: Home              Anticipated d/c is to:  Home in 1 to 2 days              Patient currently is not medically stable to d/c.   Difficult to place patient No     Infusions:   ceFEPime (MAXIPIME) IV     vancomycin      Scheduled Meds:  atorvastatin  40 mg Oral Daily   carvedilol  25 mg Oral BID WC   Chlorhexidine Gluconate Cloth  6 each Topical Q0600   insulin aspart  0-6 Units Subcutaneous Q4H   insulin glargine-yfgn  8 Units Subcutaneous QHS   metroNIDAZOLE  500 mg Oral Q12H   sevelamer carbonate  1,600 mg Oral TID WC    PRN  meds: acetaminophen **OR** acetaminophen   Antimicrobials: Anti-infectives (From admission, onward)    Start     Dose/Rate Route Frequency Ordered Stop   12/30/21 1200  vancomycin (VANCOCIN) IVPB 1000 mg/200 mL premix  Status:  Discontinued        1,000 mg 200 mL/hr over 60 Minutes Intravenous Every M-W-F (Hemodialysis) 12/29/21 0502 12/29/21 0957   12/29/21 2200  ceFEPIme (MAXIPIME) 1 g in sodium chloride 0.9 % 100 mL IVPB        1 g 200 mL/hr over 30 Minutes Intravenous Every 24 hours 12/29/21 0502     12/29/21 1200  vancomycin (VANCOCIN) IVPB 1000 mg/200 mL premix        1,000 mg 200 mL/hr over 60 Minutes Intravenous Every T-Th-Sa (Hemodialysis) 12/29/21 0957     12/29/21 1000  metroNIDAZOLE (FLAGYL) tablet 500 mg        500 mg Oral Every 12 hours 12/29/21 0559 01/05/22 0959   12/29/21 0500  vancomycin (VANCOREADY) IVPB 2000 mg/400 mL        2,000 mg 200 mL/hr over 120 Minutes Intravenous  Once 12/29/21 0448 12/29/21 0754   12/29/21 0500  ceFEPIme (MAXIPIME) 2 g in sodium chloride 0.9 % 100 mL IVPB        2 g 200 mL/hr over 30 Minutes Intravenous  Once 12/29/21 0448 12/29/21 0536       Objective: Vitals:   12/29/21 0953 12/29/21 1119  BP: (!) 159/101 (!) 178/99  Pulse: 93 90  Resp: 15 15  Temp: 98.1 F (36.7 C) 98 F (36.7 C)  SpO2: 100% 100%    Intake/Output Summary (Last 24 hours) at 12/29/2021 1141 Last data filed at 12/29/2021 0754 Gross per 24 hour  Intake 400 ml  Output --  Net 400 ml   Filed Weights   12/28/21 1740  Weight: 86.6 kg   Weight change:  Body mass index is 25.9 kg/m.   Physical Exam: General exam: Pleasant young African-American male.  Not in visible distress Skin: No rashes, lesions or ulcers. HEENT: Atraumatic, normocephalic, no obvious bleeding Lungs: Clear to auscultation bilaterally CVS: Regular rate and rhythm, no murmur GI/Abd soft, nontender, nondistended, bowel sound present CNS: Alert, awake, oriented x3 Psychiatry: Mood  appropriate Extremities: No procedure, no calf tenderness, right foot fifth toe area wound   Data Review: I have personally reviewed the laboratory data and studies available.  F/u labs ordered Unresulted Labs (From admission, onward)     Start     Ordered   12/30/21 0500  Sedimentation rate  Daily,   R      12/29/21 0559   12/30/21 0500  C-reactive protein  Daily,   R      12/29/21 0559   12/30/21 0500  Prealbumin  Tomorrow morning,  R        12/29/21 0559   12/30/21 2130  Basic metabolic panel  Tomorrow morning,   R        12/29/21 1141   12/30/21 0500  CBC with Differential/Platelet  Tomorrow morning,   R        12/29/21 1141   12/29/21 0950  Hepatitis B surface antigen  (New Admission Hemo Labs (Hepatitis B))  Once,   R        12/29/21 0954   12/29/21 0950  Hepatitis B surface antibody  (New Admission Hemo Labs (Hepatitis B))  Once,   R        12/29/21 0954   12/29/21 0950  Hepatitis B surface antibody,quantitative  (New Admission [facility]o Labs (Hepatitis B))  Once,   R        12/29/21 0954   12/29/21 0950  Hepatitis B core antibody, total  (New Admission [facility]o Labs (Hepatitis B))  Once,   R        12/29/21 0954   12/29/21 0950  Hepatitis C antibody  (New Admission Hemo Labs (Hepatitis B))  Once,   R        12/29/21 0954   12/28/21 1727  Culture, blood (routine x 2)  BLOOD CULTURE X 2,   R      12/28/21 1729   Signed and Held  Renal function panel  Once,   R        Signed and Held            Signed, Terrilee Croak, MD [facility] 12/29/2021

## 2021-12-29 NOTE — Op Note (Addendum)
   OPERATIVE REPORT Patient name: [name] MRN: [id number] DOB: December 30, 1985  DOS:  12/29/21  Preop Dx: Osteomyelitis of right foot, unspecified type ([place])  ESRD (end stage renal disease) ([place])  Hyperglycemia Postop Dx: same  Procedure:  1. Right fifth digit amputation   Surgeon: Lorenda Peck, DPM  Anesthesia: 50-50 mixture of 2% lidocaine plain with 0.5% Marcaine plain totaling 10 infiltrated in the patient's right lower extremity  Hemostasis: No TQ  EBL: <5 mL Materials: none Injectables: as above for anesthesia Pathology: Right fifth digit and wound swab for culture   Condition: The patient tolerated the procedure and anesthesia well. No complications noted or reported   Justification for procedure: The patient is a 36 y.o. male who presents today for surgical treatment of right fifth digit osteomyelitis. All conservative modalities of been unsuccessful in providing any sort of satisfactory alleviation of symptoms with the patient. The patient was told benefits as well as possible side effects of the surgery. The patient consented for surgical correction. The patient consent form was reviewed. All patient questions were answered. No guarantees were expressed or implied. The patient and the surgeon both signed the patient consent form with the witness present and placed in the patient's chart.   Procedure in Detail: The patient was brought to the operating room, placed in the operating table in the supine position at which time an aseptic scrub and drape were performed about the patient's respective lower extremity after anesthesia was induced as described above. Attention was then directed to the surgical area where procedure number one commenced.  Procedure #1:   Attention was directed to the right fifth digit were an incision was preformed encircling the base of the toe. Incision was made down to bone and digit was amputated at the level of the metatarsophalangeal  joint. After the toe was disarticulated it was passed off to the back table to be sent to pathology for further evaluation. The extensor and flexor tendons were identified and resected as far proximally as possible. Any necrotic tissue was removed to healthy bleeding tissue. The metatarsal head was identified and noted to be hard. The area was irrigated copiously sterile saline and residual bone was sent to microbiology. Any bleeders noted were cauterized as necessary. The skin was re-approximated utilizing 3-0 nylon suture.    Dry sterile compressive dressings were then applied to all previously mentioned incision sites about the patient's lower extremity.  The patient was then transferred from the operating room to the recovery room having tolerated the procedure and anesthesia well. All vital signs are stable. After a brief stay in the recovery room the patient was readmitted to inpatient   Disposition:  Patient is okay to be discharged from podiatric standpoint.  No dressing change until follow-up.  Will need one week of PO antibiotics upon discharge. Heel weight bearing to the right lower extremity.  She will follow-up with me 1 week from discharge.  Lorenda Peck, DPM [facility]  Dr. Lorenda Peck, DPM    [address] [place], [place] [address]                Office [phone]  Fax [phone]

## 2021-12-29 NOTE — Anesthesia Procedure Notes (Signed)
Procedure Name: MAC Date/Time: 12/29/2021 6:36 PM  Performed by: Alain Marion, CRNAPre-anesthesia Checklist: Patient identified, Emergency Drugs available, Suction available and Patient being monitored Patient Re-evaluated:Patient Re-evaluated prior to induction Oxygen Delivery Method: Simple face mask Induction Type: IV induction Placement Confirmation: positive ETCO2

## 2021-12-29 NOTE — Interval H&P Note (Signed)
History and Physical Interval Note:  12/29/2021 5:29 PM  [name]  has presented today for surgery, with the diagnosis of Right fifth digit osteomyelitis.  The various methods of treatment have been discussed with the patient and family. After consideration of risks, benefits and other options for treatment, the patient has consented to  Procedure(s) with comments: AMPUTATION TOE, fifth (Right) - surgical team will do local block as a surgical intervention.  The patient's history has been reviewed, patient examined, no change in status, stable for surgery.  I have reviewed the patient's chart and labs.  Questions were answered to the patient's satisfaction.     Lorenda Peck

## 2021-12-30 ENCOUNTER — Encounter: Payer: Self-pay | Admitting: Family Medicine

## 2021-12-30 ENCOUNTER — Encounter (HOSPITAL_COMMUNITY): Payer: Self-pay | Admitting: Podiatry

## 2021-12-30 DIAGNOSIS — M869 Osteomyelitis, unspecified: Secondary | ICD-10-CM | POA: Diagnosis not present

## 2021-12-30 LAB — CBC WITH DIFFERENTIAL/PLATELET
Abs Immature Granulocytes: 0.04 10*3/uL (ref 0.00–0.07)
Basophils Absolute: 0 10*3/uL (ref 0.0–0.1)
Basophils Relative: 1 %
Eosinophils Absolute: 0.1 10*3/uL (ref 0.0–0.5)
Eosinophils Relative: 1 %
HCT: 30.2 % — ABNORMAL LOW (ref 39.0–52.0)
Hemoglobin: 10 g/dL — ABNORMAL LOW (ref 13.0–17.0)
Immature Granulocytes: 1 %
Lymphocytes Relative: 8 %
Lymphs Abs: 0.7 10*3/uL (ref 0.7–4.0)
MCH: 27 pg (ref 26.0–34.0)
MCHC: 33.1 g/dL (ref 30.0–36.0)
MCV: 81.6 fL (ref 80.0–100.0)
Monocytes Absolute: 0.5 10*3/uL (ref 0.1–1.0)
Monocytes Relative: 6 %
Neutro Abs: 7.2 10*3/uL (ref 1.7–7.7)
Neutrophils Relative %: 83 %
Platelets: 316 10*3/uL (ref 150–400)
RBC: 3.7 MIL/uL — ABNORMAL LOW (ref 4.22–5.81)
RDW: 16.2 % — ABNORMAL HIGH (ref 11.5–15.5)
WBC: 8.6 10*3/uL (ref 4.0–10.5)
nRBC: 0 % (ref 0.0–0.2)

## 2021-12-30 LAB — BASIC METABOLIC PANEL
Anion gap: 13 (ref 5–15)
BUN: 48 mg/dL — ABNORMAL HIGH (ref 6–20)
CO2: 25 mmol/L (ref 22–32)
Calcium: 8.2 mg/dL — ABNORMAL LOW (ref 8.9–10.3)
Chloride: 97 mmol/L — ABNORMAL LOW (ref 98–111)
Creatinine, Ser: 9.85 mg/dL — ABNORMAL HIGH (ref 0.61–1.24)
GFR, Estimated: 6 mL/min — ABNORMAL LOW (ref >60)
Glucose, Bld: 141 mg/dL — ABNORMAL HIGH (ref 70–99)
Potassium: 3.7 mmol/L (ref 3.5–5.1)
Sodium: 135 mmol/L (ref 135–145)

## 2021-12-30 LAB — GLUCOSE, CAPILLARY
Glucose-Capillary: 163 mg/dL — ABNORMAL HIGH (ref 70–99)
Glucose-Capillary: 182 mg/dL — ABNORMAL HIGH (ref 70–99)
Glucose-Capillary: 187 mg/dL — ABNORMAL HIGH (ref 70–99)

## 2021-12-30 LAB — HEPATITIS B SURFACE ANTIGEN: Hepatitis B Surface Ag: NONREACTIVE

## 2021-12-30 LAB — SEDIMENTATION RATE: Sed Rate: 85 mm/hr — ABNORMAL HIGH (ref 0–16)

## 2021-12-30 LAB — HEPATITIS C ANTIBODY: HCV Ab: NONREACTIVE

## 2021-12-30 LAB — HEPATITIS B CORE ANTIBODY, TOTAL: Hep B Core Total Ab: NONREACTIVE

## 2021-12-30 LAB — HEPATITIS B SURFACE ANTIBODY,QUALITATIVE: Hep B S Ab: REACTIVE — AB

## 2021-12-30 LAB — C-REACTIVE PROTEIN: CRP: 3.7 mg/dL — ABNORMAL HIGH (ref <1.0)

## 2021-12-30 LAB — PREALBUMIN: Prealbumin: 27.1 mg/dL (ref 18–38)

## 2021-12-30 MED ORDER — CALCITRIOL 0.5 MCG PO CAPS: 1.0000 ug | ORAL_CAPSULE | ORAL | 1 refills | Status: DC

## 2021-12-30 MED ORDER — DOXYCYCLINE HYCLATE 100 MG PO TABS: 100.0000 mg | ORAL_TABLET | Freq: Two times a day (BID) | ORAL | 0 refills | Status: AC

## 2021-12-30 MED ORDER — ACETAMINOPHEN 325 MG PO TABS: 650.0000 mg | ORAL_TABLET | Freq: Four times a day (QID) | ORAL | Status: DC | PRN

## 2021-12-30 NOTE — Progress Notes (Signed)
Hd 3.42hrs completed per pt request. Pt tolerated well. Avf w/out s/s of complications. Pt transported back to room aox4, no sob noted, no acute distress. Report given to bedside rn.  Total uf removed: 1993ml Medications given: none  Post hd vs: 105/92(98) 97.8 78 16 100% Post weight: 90.4kg

## 2021-12-30 NOTE — Anesthesia Postprocedure Evaluation (Signed)
Anesthesia Post Note  Patient: [name]  Procedure(s) Performed: AMPUTATION TOE, fifth (Right: Toe)     Patient location during evaluation: PACU Anesthesia Type: MAC Level of consciousness: awake and alert Pain management: pain level controlled Vital Signs Assessment: post-procedure vital signs reviewed and stable Respiratory status: spontaneous breathing, nonlabored ventilation, respiratory function stable and patient connected to nasal cannula oxygen Cardiovascular status: stable and blood pressure returned to baseline Postop Assessment: no apparent nausea or vomiting Anesthetic complications: no   No notable events documented.  Last Vitals:  Vitals:   12/30/21 0131 12/30/21 0200  BP: 138/62 (!) 164/92  Pulse: 97 88  Resp: 19 15  Temp:    SpO2: 99% 97%    Last Pain:  Vitals:   12/30/21 0125  TempSrc: Oral  PainSc:                  [place] S

## 2021-12-30 NOTE — Progress Notes (Signed)
D/C order noted. Contacted [facility] and spoke to [place]. Clinic advised pt will d/c today and resume care tomorrow. D/C summary and last renal note faxed to clinic for continuation of care (fax# [phone]).   Melven Sartorius Renal Navigator [phone]

## 2021-12-30 NOTE — Discharge Summary (Addendum)
Physician Discharge Summary  [name] JQZ:[id number] DOB: 09/28/1985 DOA: 12/28/2021  PCP: Nolene Ebbs, MD  Admit date: 12/28/2021 Discharge date: 12/30/2021  Time spent: 37 minutes  Recommendations for Outpatient Follow-up:  Recommend completion of doxycycline in the outpatient setting prescription given for 7 more days on discharge called into pharmacy Pain controlled use Tylenol Needs to comply with HD TTS  Discharge Diagnoses:  MAIN problem for hospitalization   Right foot diabetic wound ulcer Osteomyelitis fifth proximal phalanx ESRD TTS Hypertensive urgency on admission DM TY 2 Hyperlipidemia  Please see below for itemized issues addressed in HOpsital- refer to other progress notes for clarity if needed  Discharge Condition: Improved  Diet recommendation: Renal  Filed Weights   12/28/21 1740  Weight: 86.6 kg    History of present illness:  36 year old black male with type I diabetic (legally blind) History of HOCM with EF 45% 2022 HLD ESRD TTS [facility] amputation left fifth toe 05/10/2020 prior right toe ulcer  Patient presented to his podiatrist 1 month ago Dr. Sherryle Lis and was noted to have tinea pedis secondary bacterial infection Rx doxycycline ketoconazole Seen again by podiatry 6/19 found to have ulceration right fifth toe with progressive exposure of the proximal phalanx and was referred to the ED  Patient was afebrile, bicarb is 17 gap is 21 BUN was 77 CRP elevated to 6.5 ESR 85 pain radiographs showed fracture through area of osteopenia distal fifth proximal phalanx with dislocation of fifth PIP and suspicion of osteo blood cultures were obtained patient started on Vanco cefepime Flagyl    Hospital Course:  Right fifth proximal phalanx osteolysis/dislocation/fracture Treated with Betadine and then definitive surgery with right fifth digit amputation with Dr. Blenda Mounts 6/20 Vancomycin cefepime Flagyl-->doxycycline 100 twice daily for 14  days on discharge Pain control Tylenol for discharge  DM TY 2 Patient was kept on lower dosing of Lantus 8 units but resumed on 16 units on discharge-he will also continue lispro insulin sliding scale as per his specialized scale Hypertrophic cardiomyopathy HTN Patient states was taken off losartan because of high potassium, resume Coreg 25 twice daily Challenge outpatient fluid status Will need further management as per cardiology on follow-up ESRD TTS since 2021 Resume Renvela 1600 3 times daily (patient was not taking) in addition to [name] 2.5 MWF as well as calcitriol  Procedures: Right fifth digit amputation lower extremity  Discharge Exam: Vitals:   12/30/21 0630 12/30/21 0712  BP: (!) 105/92 (!) 163/82  Pulse: 78 79  Resp: 16   Temp: 97.8 F (36.6 C) 98.8 F (37.1 C)  SpO2: 100% 99%    Subj on day of d/c   Poor eye contact-taking meds-no pain  General Exam on discharge  Light reflex only No icterus no pallor thick neck Mallampati 1 S1-S2 no murmur Abdomen soft no rebound Right lower extremity is bandaged and in a postop boot No lower extremity edema Chest clear Psych euthymic  Discharge Instructions    Allergies as of 12/30/2021       Reactions   Amlodipine Nausea And Vomiting, Other (See Comments)   Patient was taking Amlodipine and Hydralazine at the same time, so the reactions came from one of the 2: Lethargy and an all-over feeling of NOT feeling well (also)   Hydralazine Nausea And Vomiting, Other (See Comments)   Patient was taking Hydralazine AND Amlodipine at the same time, so the reactions came from one of the 2: Lethargy and an all-over feeling of NOT feeling well (also)  Medication List     STOP taking these medications    doxercalciferol 4 MCG/2ML injection Commonly known as: [name]   imipramine 25 MG tablet Commonly known as: TOFRANIL   ketoconazole 2 % cream Commonly known as: NIZORAL   losartan 50 MG  tablet Commonly known as: COZAAR   oxyCODONE-acetaminophen 5-325 MG tablet Commonly known as: PERCOCET/ROXICET       TAKE these medications    acetaminophen 325 MG tablet Commonly known as: TYLENOL Take 2 tablets (650 mg total) by mouth every 6 (six) hours as needed for mild pain (or Fever >/= 101). What changed:  when to take this reasons to take this   atorvastatin 40 MG tablet Commonly known as: LIPITOR Take 40 mg by mouth daily.   calcitRIOL 0.5 MCG capsule Commonly known as: ROCALTROL Take 2 capsules (1 mcg total) by mouth Every Tuesday,Thursday,and Saturday with dialysis. Start taking on: December 31, 2021   carvedilol 25 MG tablet Commonly known as: COREG Take 1 tablet (25 mg total) by mouth 2 (two) times daily with a meal.   doxycycline 100 MG tablet Commonly known as: VIBRA-TABS Take 1 tablet (100 mg total) by mouth 2 (two) times daily for 14 days.   insulin glargine-yfgn 100 UNIT/ML injection Commonly known as: SEMGLEE Inject 0.16 mLs (16 Units total) into the skin at bedtime. What changed:  how much to take when to take this   insulin lispro 100 UNIT/ML injection Commonly known as: HUMALOG Inject 1-7 Units into the skin See admin instructions. Per sliding scale 3 times daily with meals 151-200= 1 unit 201-250= 2 units 251-300= 3 units 301-350= 5 units 351-400= 7 units Greater than 400 call md takes only as needed if blood sugar is over 200   multivitamin Tabs tablet Take 1 tablet by mouth at bedtime.   multivitamin with minerals tablet Take 1 tablet by mouth daily.   ondansetron 4 MG tablet Commonly known as: ZOFRAN Take 4 mg by mouth every 8 (eight) hours as needed for nausea or vomiting.   prednisoLONE acetate 1 % ophthalmic suspension Commonly known as: PRED FORTE Place 1 drop into the left eye See admin instructions. 2-3 times daily   sevelamer carbonate 800 MG tablet Commonly known as: RENVELA Take 2 tablets (1,600 mg total) by mouth 3  (three) times daily with meals.               Discharge Care Instructions  (From admission, onward)           Start     Ordered   12/30/21 0000  Discharge wound care:       Comments: As per Dr. Cannon Kettle   12/30/21 0940   12/30/21 0000  Leave dressing on - Keep it clean, dry, and intact until clinic visit        12/30/21 0940           Allergies  Allergen Reactions   Amlodipine Nausea And Vomiting and Other (See Comments)    Patient was taking Amlodipine and Hydralazine at the same time, so the reactions came from one of the 2: Lethargy and an all-over feeling of NOT feeling well (also)   Hydralazine Nausea And Vomiting and Other (See Comments)    Patient was taking Hydralazine AND Amlodipine at the same time, so the reactions came from one of the 2: Lethargy and an all-over feeling of NOT feeling well (also)      The results of significant diagnostics from this hospitalization (including  imaging, microbiology, ancillary and laboratory) are listed below for reference.    Significant Diagnostic Studies: DG Foot Complete Right  Result Date: 12/29/2021 CLINICAL DATA:  Post-op; osteomyelitis of fifth toe of right foot. [id number] EXAM: RIGHT FOOT COMPLETE - 3+ VIEW COMPARISON:  Xr foot 12/28/21 FINDINGS: Status post fifth digit phalynx amputation. Chronic cortical irregularity of the proximal phalynx head likely old healed fracture. Old healed third digit metatarsal fracture. No cortical erosion or destruction. There is no evidence of fracture or dislocation. Subuctaneous soft tissue edema and emphysema. Vascular calcifications. IMPRESSION: 1. Status post fifth digit phalynx amputation. 2. No radiographic findings osteomyelitis. 3.  No acute displaced fracture or dislocation. Electronically Signed   By: Iven Finn M.D.   On: 12/29/2021 20:43   DG Foot Complete Right  Result Date: 12/28/2021 CLINICAL DATA:  Assess for osteomyelitis of the fifth toe. Ulceration of the left  fifth toe for six-months. EXAM: RIGHT FOOT COMPLETE - 3+ VIEW COMPARISON:  Nov 30, 2021 FINDINGS: There is generalized osteopenia of the mid to distal fifth proximal phalanx with fracture through the area of osteopenia in the distal aspect of the fifth proximal phalanx. There is dislocation of the fifth proximal interphalangeal joint. IMPRESSION: Fracture through the area of osteopenia in the distal aspect of the fifth proximal phalanx with dislocation of the fifth proximal interphalangeal joint. No other fracture identified. Generalized osteopenia of the mid to distal aspect of the fifth proximal phalanx, suspicious for osteomyelitis. Electronically Signed   By: Abelardo Diesel M.D.   On: 12/28/2021 18:16   DG Foot Complete Right  Result Date: 12/09/2021 Please see detailed radiograph report in office note.   Microbiology: Recent Results (from the past 240 hour(s))  Culture, blood (routine x 2)     Status: None (Preliminary result)   Collection Time: 12/28/21  5:40 PM   Specimen: BLOOD  Result Value Ref Range Status   Specimen Description BLOOD LEFT ANTECUBITAL  Final   Special Requests   Final    BOTTLES DRAWN AEROBIC AND ANAEROBIC Blood Culture adequate volume   Culture   Final    NO GROWTH 2 DAYS Performed at [facility], [address], [place], [place] [address]    Report Status PENDING  Incomplete  Culture, blood (routine x 2)     Status: None (Preliminary result)   Collection Time: 12/29/21  4:33 AM   Specimen: BLOOD  Result Value Ref Range Status   Specimen Description BLOOD RIGHT ANTECUBITAL  Final   Special Requests   Final    BOTTLES DRAWN AEROBIC AND ANAEROBIC Blood Culture adequate volume   Culture   Final    NO GROWTH 1 DAY Performed at [facility], [place] [address] [place], [place] [address]    Report Status PENDING  Incomplete  Aerobic/Anaerobic Culture w Gram Stain (surgical/deep wound)     Status: None (Preliminary result)   Collection Time: 12/29/21   7:10 PM   Specimen: PATH Other; Tissue  Result Value Ref Range Status   Specimen Description WOUND  Final   Special Requests RIGHT FIFTH TOE TEST VANCO CEFEPIME  Final   Gram Stain NO WBC SEEN NO ORGANISMS SEEN   Final   Culture   Final    NO GROWTH < 12 HOURS Performed at [facility], [place] [address] [place], [place] [address]    Report Status PENDING  Incomplete     Labs: Basic Metabolic Panel: Recent Labs  Lab 12/28/21 1740  12/29/21 1749  NA 139 137  K 4.8 4.7  CL 101 108  CO2 17*  --   GLUCOSE 216* 91  BUN 77* 100*  CREATININE 15.14* >18.00*  CALCIUM 8.9  --    Liver Function Tests: Recent Labs  Lab 12/28/21 1740  AST 10*  ALT 16  ALKPHOS 62  BILITOT 0.6  PROT 9.1*  ALBUMIN 3.7   No results for input(s): "LIPASE", "AMYLASE" in the last 168 hours. No results for input(s): "AMMONIA" in the last 168 hours. CBC: Recent Labs  Lab 12/28/21 1740 12/29/21 1749 12/30/21 0808  WBC 9.2  --  8.6  NEUTROABS 7.2  --  7.2  HGB 11.1* 11.6* 10.0*  HCT 34.8* 34.0* 30.2*  MCV 85.5  --  81.6  PLT 323  --  316   Cardiac Enzymes: No results for input(s): "CKTOTAL", "CKMB", "CKMBINDEX", "TROPONINI" in the last 168 hours. BNP: BNP (last 3 results) Recent Labs    02/08/21 1905 03/12/21 1223  BNP >4,500.0* 4,361.1*    ProBNP (last 3 results) No results for input(s): "PROBNP" in the last 8760 hours.  CBG: Recent Labs  Lab 12/29/21 1520 12/29/21 1748 12/29/21 1914 12/29/21 2039 12/30/21 0004  GLUCAP 102* 97 92 160* 182*       Signed:  Nita Sells MD   [facility] 12/30/2021, 9:24 AM

## 2021-12-30 NOTE — Progress Notes (Signed)
Subjective:  Patient ID: [name], male    DOB: May 28, 1986,  MRN: [id number]  Patient seen at bedside this morning s/p right fifth digit amputation. Relates doing well without any pain. Just returned from HD.    Past Medical History:  Diagnosis Date   ADHD (attention deficit hyperactivity disorder)    Diabetes mellitus    ESRD on hemodialysis ([place])    [facility]   High cholesterol    Hypertension    Hypertrophic cardiomyopathy ([place]) 02/26/2021   Seizure ([place]) 02/08/2021     Past Surgical History:  Procedure Laterality Date   AMPUTATION TOE Right 12/29/2021   Procedure: AMPUTATION TOE, fifth;  Surgeon: Lorenda Peck, DPM;  Location: [facility];  Service: Podiatry;  Laterality: Right;  surgical team will do local block   AV FISTULA PLACEMENT Left 08/11/2020   Procedure: LEFT UPPER EXTREMITY ARTERIOVENOUS (AV) FISTULA CREATION;  Surgeon: Cherre Robins, MD;  Location: [facility];  Service: Vascular;  Laterality: Left;   FISTULA SUPERFICIALIZATION Left 11/14/3974   Procedure: PLICATION OF LEFT RADIUS CEPHALIC FISTULA;  Surgeon: Cherre Robins, MD;  Location: [facility];  Service: Vascular;  Laterality: Left;  PERIPHERAL NERVE BLOCK   FRACTURE SURGERY     I & D EXTREMITY Left 07/16/2014   Procedure: IRRIGATION AND DEBRIDEMENT EXTREMITY/LEFT INDEX FINGER;  Surgeon: Leanora Cover, MD;  Location: [facility];  Service: Orthopedics;  Laterality: Left;   IR PERC TUN PERIT CATH WO PORT S&I /IMAG  08/07/2020   IR US GUIDE VASC ACCESS RIGHT  08/07/2020       Latest Ref Rng & Units 12/29/2021    5:49 PM 12/28/2021    5:40 PM 12/02/2021    6:34 AM  CBC  WBC 4.0 - 10.5 K/uL  9.2    Hemoglobin 13.0 - 17.0 g/dL 11.6  11.1  11.6   Hematocrit 39.0 - 52.0 % 34.0  34.8  34.0   Platelets 150 - 400 K/uL  323         Latest Ref Rng & Units 12/29/2021    5:49 PM 12/28/2021    5:40 PM 12/02/2021    6:34 AM  BMP  Glucose 70 - 99 mg/dL 91  216  144   BUN 6 - 20 mg/dL 100  77  55   Creatinine 0.61 -  1.24 mg/dL >18.00  15.14  12.40   Sodium 135 - 145 mmol/L 137  139  137   Potassium 3.5 - 5.1 mmol/L 4.7  4.8  3.9   Chloride 98 - 111 mmol/L 108  101  95   CO2 22 - 32 mmol/L  17    Calcium 8.9 - 10.3 mg/dL  8.9       Objective:   Vitals:   12/30/21 0630 12/30/21 0712  BP: (!) 105/92 (!) 163/82  Pulse: 78 79  Resp: 16   Temp: 97.8 F (36.6 C) 98.8 F (37.1 C)  SpO2: 100% 99%    General:AA&O x 3. Normal mood and affect   Vascular: DP and PT pulses 2/4 bilateral. Brisk capillary refill to all digits. Pedal hair present   Neruological. Epicritic sensation grossly intact.   Derm: Dressing clean dry and intact. No strike through noted.   MSK: MMT 5/5 in dorsiflexion, plantar flexion, inversion and eversion. Normal joint ROM without pain or crepitus.   XR right foot:  IMPRESSION: Fracture through the area of osteopenia in the distal aspect of the fifth proximal phalanx with dislocation of the fifth  proximal interphalangeal joint. No other fracture identified.   Generalized osteopenia of the mid to distal aspect of the fifth proximal phalanx, suspicious for osteomyelitis.   Assessment & Plan:  Patient was evaluated and treated and all questions answered.  DX: s/p right fifth digit amputation.  Patient is okay to be discharged from podiatric standpoint.  Dressing to remain intact until follow-up in outpatient clinic.   Will need one week of PO antibiotics upon discharge. Heel weight bearing to the right lower extremity.  He will follow-up with me 1 week from discharge.     Lorenda Peck, DPM  Accessible via secure chat for questions or concerns.

## 2021-12-30 NOTE — Progress Notes (Addendum)
[facility] Progress Note   Subjective: Being discharged today. Gives permission to tell staff at [facility] dialysis unit that he says, "Hello". No C/Os. Finished HD early this AM. Net UF 1.9 L. No post wt.   Objective Vitals:   12/30/21 0600 12/30/21 0618 12/30/21 0630 12/30/21 0712  BP: (!) 112/97 (!) 170/87 (!) 105/92 (!) 163/82  Pulse: 78 76 78 79  Resp: 14 16 16    Temp:  97.8 F (36.6 C) 97.8 F (36.6 C) 98.8 F (37.1 C)  TempSrc:  Oral Oral Oral  SpO2: 100% 100% 100% 99%  Weight:      Height:       General:  Chronically ill male in no acute distress. Lungs: Clear bilaterally to auscultation without wheezes, rales, or rhonchi. Breathing is unlabored. Heart: RRR with S1 S2. No murmurs, rubs, or gallops appreciated. Abdomen: Soft, non-tender, non-distended with normoactive bowel sounds. No rebound/guarding. No obvious abdominal masses. M-S:  Strength and tone appear normal for age. Lower extremities:No LE edema,  Drsg intact r foot Neuro: Alert and oriented X 3. Moves all extremities spontaneously. Psych:  Responds to questions appropriately with a normal affect. Dialysis Access: LUA AVF suture intact lower portion of AVF. Has been using AVF, sticking above area. + T/B.     Dialysis Orders:  Additional Objective Labs: Basic Metabolic Panel: Recent Labs  Lab 12/28/21 1740 12/29/21 1749 12/30/21 0808  NA 139 137 135  K 4.8 4.7 3.7  CL 101 108 97*  CO2 17*  --  25  GLUCOSE 216* 91 141*  BUN 77* 100* 48*  CREATININE 15.14* >18.00* 9.85*  CALCIUM 8.9  --  8.2*   Liver Function Tests: Recent Labs  Lab 12/28/21 1740  AST 10*  ALT 16  ALKPHOS 62  BILITOT 0.6  PROT 9.1*  ALBUMIN 3.7   No results for input(s): "LIPASE", "AMYLASE" in the last 168 hours. CBC: Recent Labs  Lab 12/28/21 1740 12/29/21 1749 12/30/21 0808  WBC 9.2  --  8.6  NEUTROABS 7.2  --  7.2  HGB 11.1* 11.6* 10.0*  HCT 34.8* 34.0* 30.2*  MCV 85.5  --  81.6  PLT 323   --  316   Blood Culture    Component Value Date/Time   SDES WOUND 12/29/2021 1910   SPECREQUEST RIGHT FIFTH TOE TEST VANCO CEFEPIME 12/29/2021 1910   CULT  12/29/2021 1910    NO GROWTH < 12 HOURS Performed at [facility] [address] [place], [place] [address]    REPTSTATUS PENDING 12/29/2021 1910    Cardiac Enzymes: No results for input(s): "CKTOTAL", "CKMB", "CKMBINDEX", "TROPONINI" in the last 168 hours. CBG: Recent Labs  Lab 12/29/21 1914 12/29/21 2039 12/30/21 0004 12/30/21 0943 12/30/21 1141  GLUCAP 92 160* 182* 187* 163*   Iron Studies: No results for input(s): "IRON", "TIBC", "TRANSFERRIN", "FERRITIN" in the last 72 hours. @lablastinr3 @ Studies/Results: DG Foot Complete Right  Result Date: 12/29/2021 CLINICAL DATA:  Post-op; osteomyelitis of fifth toe of right foot. [id number] EXAM: RIGHT FOOT COMPLETE - 3+ VIEW COMPARISON:  Xr foot 12/28/21 FINDINGS: Status post fifth digit phalynx amputation. Chronic cortical irregularity of the proximal phalynx head likely old healed fracture. Old healed third digit metatarsal fracture. No cortical erosion or destruction. There is no evidence of fracture or dislocation. Subuctaneous soft tissue edema and emphysema. Vascular calcifications. IMPRESSION: 1. Status post fifth digit phalynx amputation. 2. No radiographic findings osteomyelitis. 3.  No acute displaced fracture or dislocation. Electronically Signed  By: Iven Finn M.D.   On: 12/29/2021 20:43   DG Foot Complete Right  Result Date: 12/28/2021 CLINICAL DATA:  Assess for osteomyelitis of the fifth toe. Ulceration of the left fifth toe for six-months. EXAM: RIGHT FOOT COMPLETE - 3+ VIEW COMPARISON:  Nov 30, 2021 FINDINGS: There is generalized osteopenia of the mid to distal fifth proximal phalanx with fracture through the area of osteopenia in the distal aspect of the fifth proximal phalanx. There is dislocation of the fifth proximal interphalangeal joint. IMPRESSION:  Fracture through the area of osteopenia in the distal aspect of the fifth proximal phalanx with dislocation of the fifth proximal interphalangeal joint. No other fracture identified. Generalized osteopenia of the mid to distal aspect of the fifth proximal phalanx, suspicious for osteomyelitis. Electronically Signed   By: Abelardo Diesel M.D.   On: 12/28/2021 18:16   Medications:  ceFEPime (MAXIPIME) IV 1 g (12/29/21 2155)   vancomycin Stopped (12/29/21 1442)    atorvastatin  40 mg Oral Daily   [START ON 12/31/2021] calcitRIOL  1 mcg Oral Q T,Th,Sa-HD   carvedilol  25 mg Oral BID WC   Chlorhexidine Gluconate Cloth  6 each Topical Q0600   insulin aspart  0-6 Units Subcutaneous Q4H   insulin glargine-yfgn  8 Units Subcutaneous QHS   metroNIDAZOLE  500 mg Oral Q12H   sevelamer carbonate  1,600 mg Oral TID WC     Dialysis Orders: Center: [facility] 4 hours 450/500 86 kg 1.0 K/2.5 Ca AVF -No heparin -Venofer 50 mg IV weekly -Mircera 150 mcg IV q 2 weeks -Calcitriol 1 mcg PO TIW   Assessment/Plan:  Osteomyelitis R 5th toe-S/P amputation of 5th R toe. Per primary  ESRD -  T,Th,S. Next HD 12/31/2021 at OP clinic  Hypertension/volume  - No evidence of excess volume by exam but slightly hypertensive. UF as tolerated. Resume carvedilol when appropriate.   Anemia  - HGB 11.1. Follow HGB  Metabolic bone disease -  No binders on home medication list. Continue VDRA.  Nutrition - renal diet.  DMT2-per primary  Disposition: Stable to discharge.   Rita H. Brown NP-C 12/30/2021, 11:45 AM  [employer] [phone]   Seen and examined independently.  Agree with note and exam as documented above by physician extender and as noted here.  # Osteomyelitis right 5th toe  - abx per primary team - s/p toe amputation on 6/20    # ESRD - HD per TTS schedule   # HTN  - improved on current regimen   # Anemia CKD  - no indication for ESA currently - last ESA dose not available.  Reassess outpatient    # metabolic bone disease  - renvela is ordered; continue activated vit D per usual regimen (please do not include calcitriol on his medication list as he gets at dialysis)  Disposition per primary team - per charting for home today  Claudia Desanctis, MD 12/30/2021 3:29 PM

## 2021-12-30 NOTE — Progress Notes (Signed)
Pt waited until it was time for him to be d/c to ask for a work note, pt acted like he was in a rush and was unable to wait for the note, on call provider contacted.

## 2021-12-30 NOTE — Plan of Care (Signed)

## 2021-12-31 LAB — SURGICAL PATHOLOGY

## 2021-12-31 LAB — HEPATITIS B SURFACE ANTIBODY, QUANTITATIVE: Hep B S AB Quant (Post): >1000 m[IU]/mL (ref >9.9)

## 2022-01-02 LAB — CULTURE, BLOOD (ROUTINE X 2)
Culture: NO GROWTH
Special Requests: ADEQUATE

## 2022-01-03 LAB — AEROBIC/ANAEROBIC CULTURE W GRAM STAIN (SURGICAL/DEEP WOUND): Gram Stain: NONE SEEN

## 2022-01-03 LAB — CULTURE, BLOOD (ROUTINE X 2)
Culture: NO GROWTH
Special Requests: ADEQUATE

## 2022-01-07 ENCOUNTER — Ambulatory Visit (INDEPENDENT_AMBULATORY_CARE_PROVIDER_SITE_OTHER): Payer: Medicaid Other | Admitting: Podiatry

## 2022-01-07 DIAGNOSIS — Z91199 Patient's noncompliance with other medical treatment and regimen due to unspecified reason: Secondary | ICD-10-CM

## 2022-01-07 NOTE — Progress Notes (Signed)
No show

## 2022-01-08 ENCOUNTER — Ambulatory Visit: Payer: Medicaid Other | Admitting: Podiatry

## 2022-01-13 ENCOUNTER — Ambulatory Visit: Payer: Medicaid Other | Admitting: Podiatry

## 2022-02-22 ENCOUNTER — Encounter: Payer: Self-pay | Admitting: Podiatry

## 2022-02-22 ENCOUNTER — Ambulatory Visit (INDEPENDENT_AMBULATORY_CARE_PROVIDER_SITE_OTHER): Payer: Medicaid Other | Admitting: Podiatry

## 2022-02-22 DIAGNOSIS — M79675 Pain in left toe(s): Secondary | ICD-10-CM

## 2022-02-22 DIAGNOSIS — Z89421 Acquired absence of other right toe(s): Secondary | ICD-10-CM | POA: Diagnosis not present

## 2022-02-22 DIAGNOSIS — M79674 Pain in right toe(s): Secondary | ICD-10-CM

## 2022-02-22 DIAGNOSIS — B351 Tinea unguium: Secondary | ICD-10-CM | POA: Diagnosis not present

## 2022-02-23 NOTE — Progress Notes (Signed)
  Subjective:  Patient ID: [name], male    DOB: [birth year],  MRN: [id number]  Chief Complaint  Patient presents with   Nail Problem    Patient reports the great toe toenail on the right foot is long, thick and difficult to trim. Suture in place on the lateral aspect of the right foot.    36 y.o. male presents with the above complaint. History confirmed with patient.  He returns for follow-up he did undergo fifth toe amputation and has not seen [place] since then.  His great toenail on the right foot is quite thickened elongated brown discolored  Objective:  Physical Exam: warm, good capillary refill, no trophic changes or ulcerative lesions, normal DP and PT pulses, and well-healed surgical incision, sutures remaining.  Dystrophic thick elongated hallux nail  Radiographs: Multiple views x-ray of the right foot: no soft tissue emphysema, no signs of osteomyelitis Assessment:   1. Status post amputation of lesser toe of right foot ([place])   2. Pain due to onychomycosis of toenails of both feet       Plan:  Patient was evaluated and treated and all questions answered.  Toe amputation appears to be well-healed at this point.  Discussed with him ongoing care for this and he may begin applying lotion or softening agents to the incision.  Advised to watch for reopening or reulceration here or adjacent at the fourth toe.   Discussed the etiology and treatment options for the condition in detail with the patient. Educated patient on the topical and oral treatment options for mycotic nails. Recommended debridement of the nails today. Sharp and mechanical debridement performed of all painful and mycotic nails today. Nails debrided in length and thickness using a nail nipper to level of comfort. Discussed treatment options including appropriate shoe gear. Follow up as needed for painful nails.   Return if symptoms worsen or fail to improve.

## 2022-04-14 IMAGING — CT CT ABD-PELV W/ CM
2 of 4 series · 16 of 46 positions shown, 18 images · IV contrast (omnipaque)
Comparison: Ultrasound 03/11/2021, limited abdominal CT 02/29/2020

CLINICAL DATA: Abdominal pain, acute, nonlocalized

EXAM:
CT ABDOMEN AND PELVIS WITH CONTRAST
TECHNIQUE: Multidetector CT imaging of the abdomen and pelvis was performed
using the standard protocol following bolus administration of
intravenous contrast.
CONTRAST:  80mL OMNIPAQUE IOHEXOL 350 MG/ML SOLN

[Series 3: abd/ pelvis 5.0 i30f 2 · axial · 0.84mm/px · z∈[-530,-74]mm · 13 of 101 slices shown, 15 images]
[im 5/101  soft-tissue]
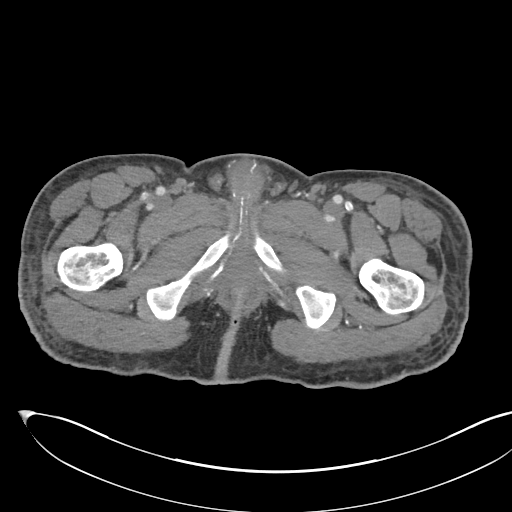
[im 5/101  bone]
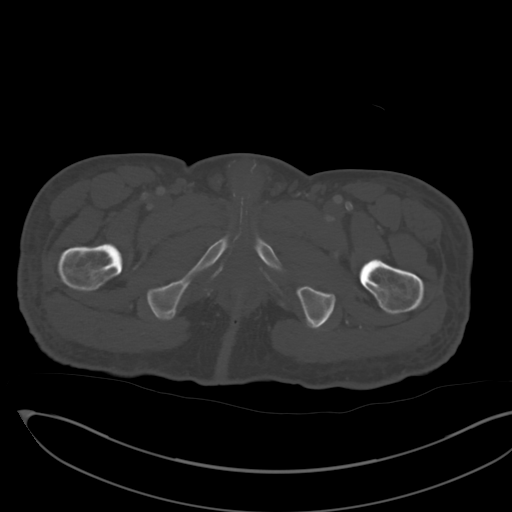
[im 13/101  soft-tissue]
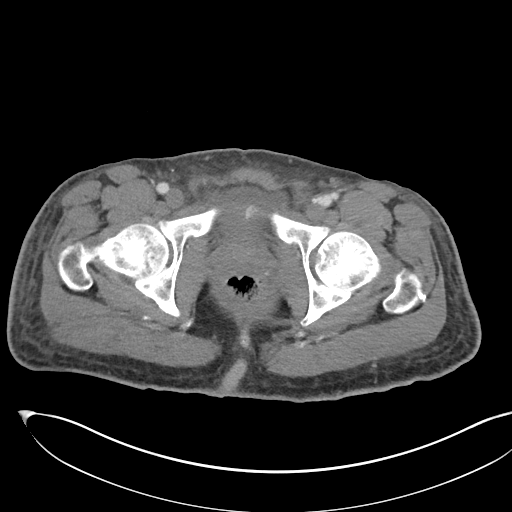
[im 21/101  soft-tissue]
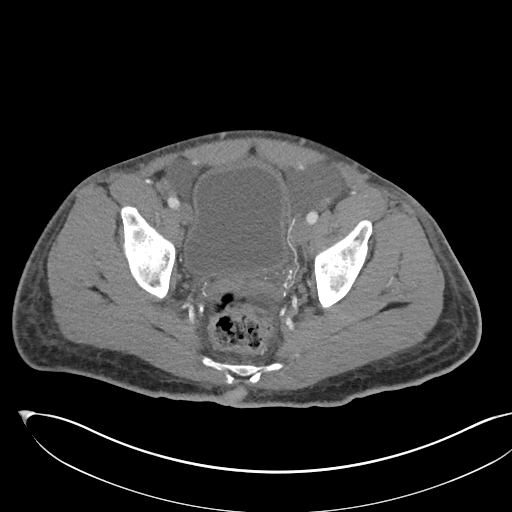
[im 30/101  soft-tissue]
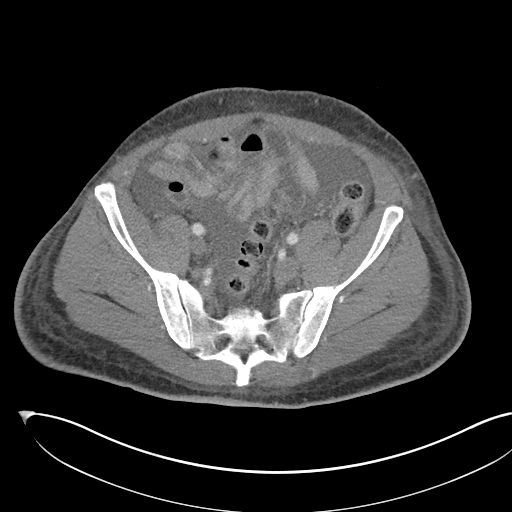
[im 34/101  soft-tissue]
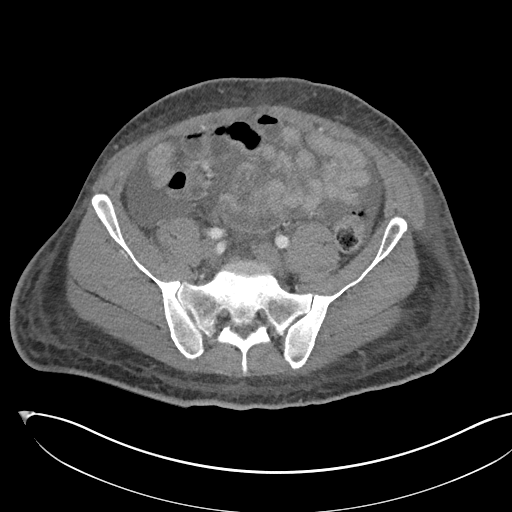
[im 42/101  soft-tissue]
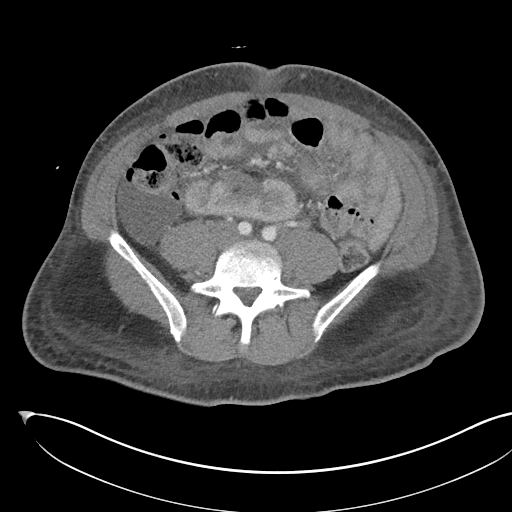
[im 51/101  soft-tissue]
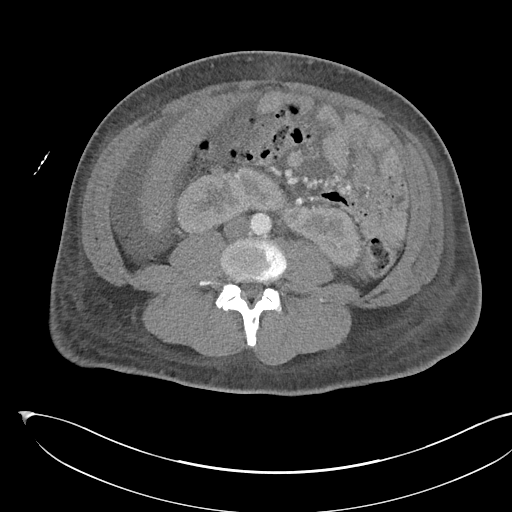
[im 59/101  soft-tissue]
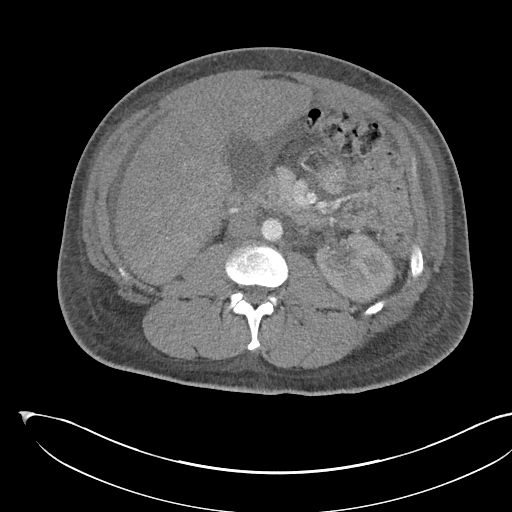
[im 67/101  soft-tissue]
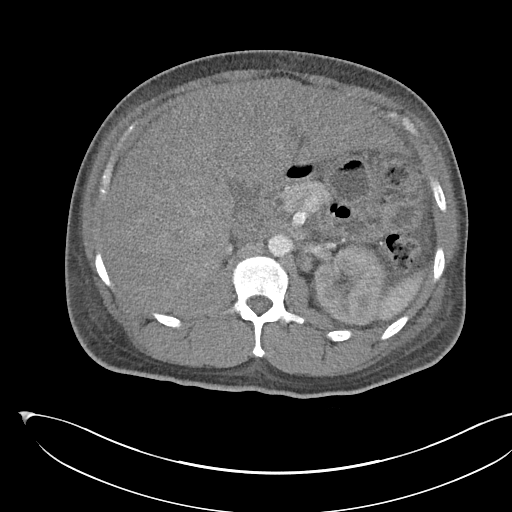
[im 67/101  bone]
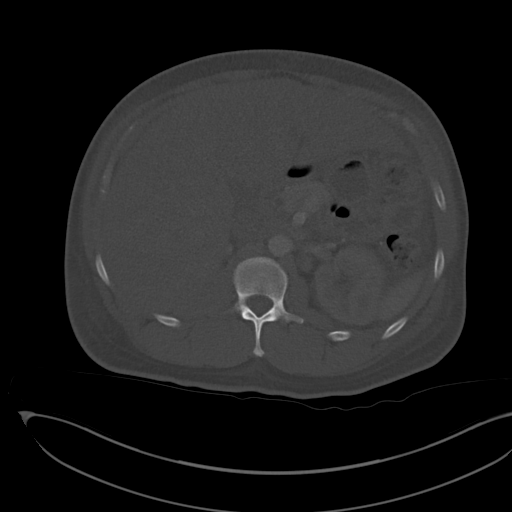
[im 71/101  soft-tissue]
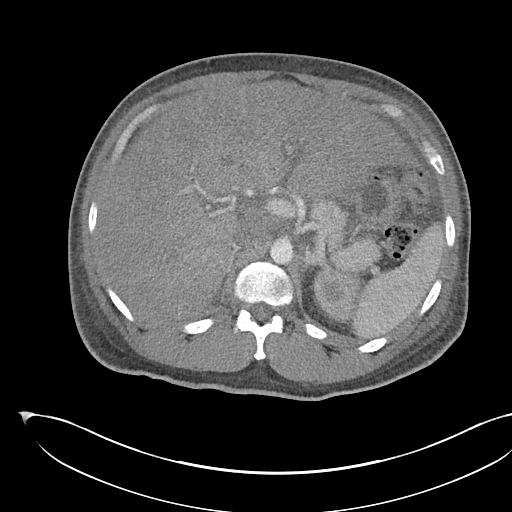
[im 80/101  soft-tissue]
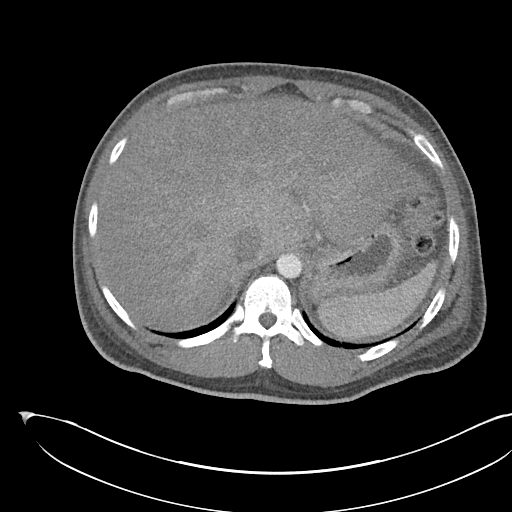
[im 88/101  soft-tissue]
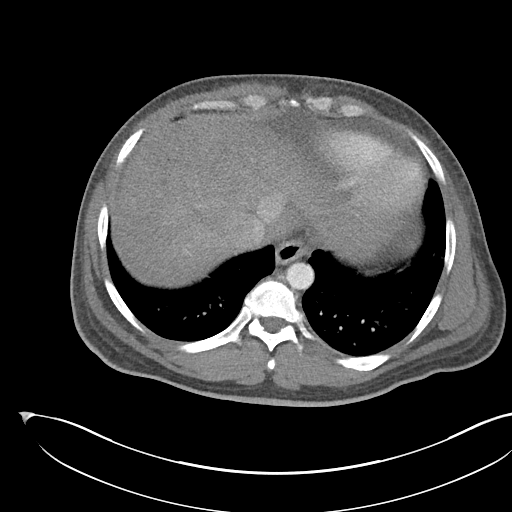
[im 96/101  soft-tissue]
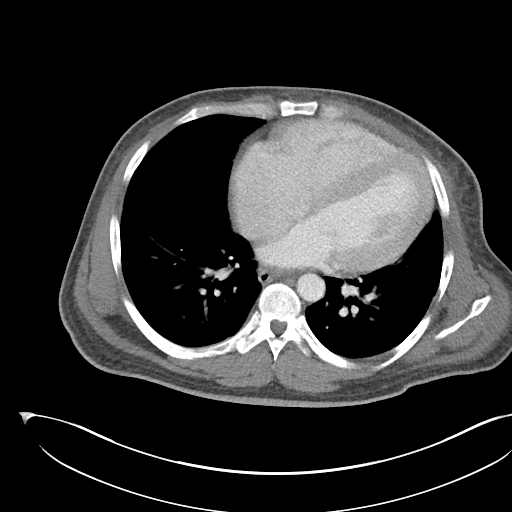

[Series 6: coronal soft tissue · coronal · 0.91mm/px · 3 of 97 slices shown]
[im 33/97  soft-tissue]
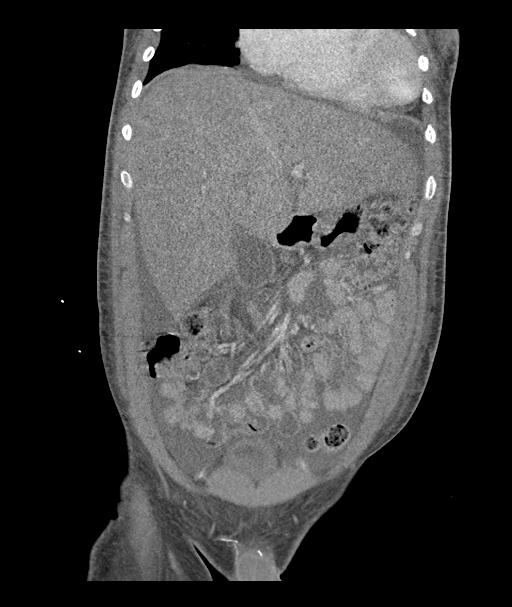
[im 43/97  soft-tissue]
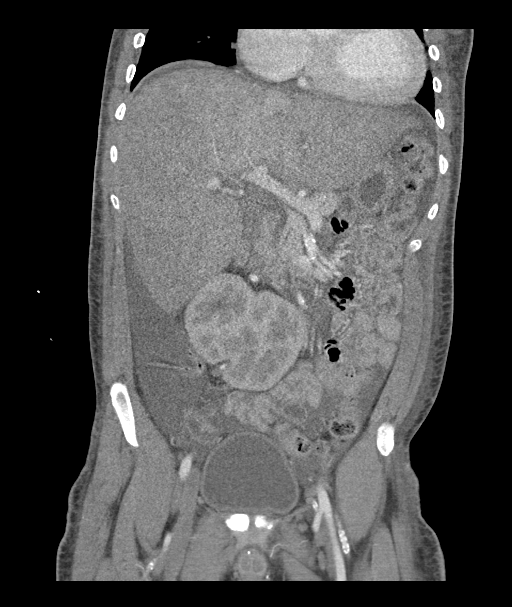
[im 54/97  soft-tissue]
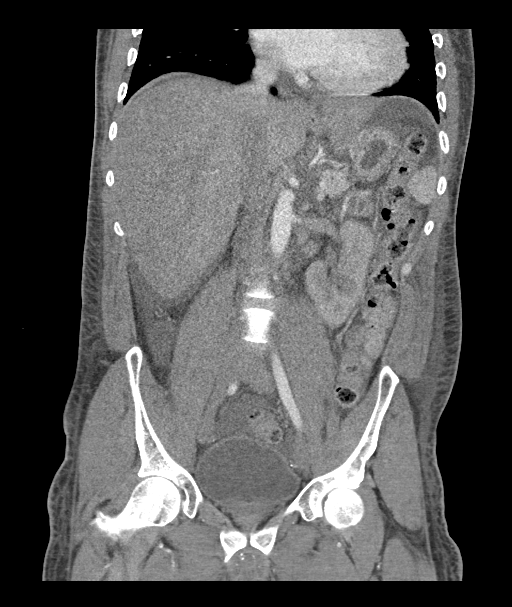

[16 of 46 positions shown; findings below may reference images not displayed]

FINDINGS: Lower chest: Extensive ground-glass opacity with patchy
consolidation within the bilateral lung bases, right slightly worse
than left. Cardiomegaly with small pericardial effusion.

Hepatobiliary: Heterogeneous appearance of the liver. No focal liver
lesion is identified. Unremarkable appearance of the gallbladder. No
hyperdense gallstone. No intrahepatic biliary dilatation.

Pancreas: Unremarkable. No pancreatic ductal dilatation or
surrounding inflammatory changes.

Spleen: Normal in size without focal abnormality.

Adrenals/Urinary Tract: Unremarkable adrenal glands. Horseshoe
kidney. No focal renal lesion, stone, or hydronephrosis. Urinary
bladder is within normal limits.

Stomach/Bowel: Stomach is within normal limits. Appendix appears
normal (series 3, image 63). No evidence of bowel wall thickening,
distention, or inflammatory changes.

Vascular/Lymphatic: Normal caliber aorta. Small vessel
calcifications are seen within the pelvis. Lymphadenopathy.

Reproductive: Prostate is unremarkable.

Other: Small to moderate volume ascites throughout the abdomen and
pelvis. No organized or rim enhancing fluid collections. No
pneumoperitoneum. No abdominal wall hernia.

Musculoskeletal: Diffuse anasarca.  No acute osseous abnormality.
IMPRESSION: 1. Extensive ground-glass opacity with patchy consolidation within
the bilateral lung bases, right slightly worse than left. Findings
may reflect pulmonary edema, multifocal pneumonia, versus ARDS.
2. Cardiomegaly with small pericardial effusion.
3. Small to moderate volume ascites throughout the abdomen and
pelvis.
4. Diffuse anasarca.
5. Heterogeneous appearance of the liver may reflect hepatic venous
congestion, acute hepatitis, or possibly fatty infiltration.
6. Horseshoe kidney.
7. Age advanced atherosclerosis.

## 2022-04-14 IMAGING — CR DG ABDOMEN ACUTE W/ 1V CHEST
4 series · 4 of 4 positions shown · non-contrast
Comparison: 02/29/2020

CLINICAL DATA: Abdominal pain.  Vomiting blood

EXAM:
DG ABDOMEN ACUTE WITH 1 VIEW CHEST

[abdomen erect]
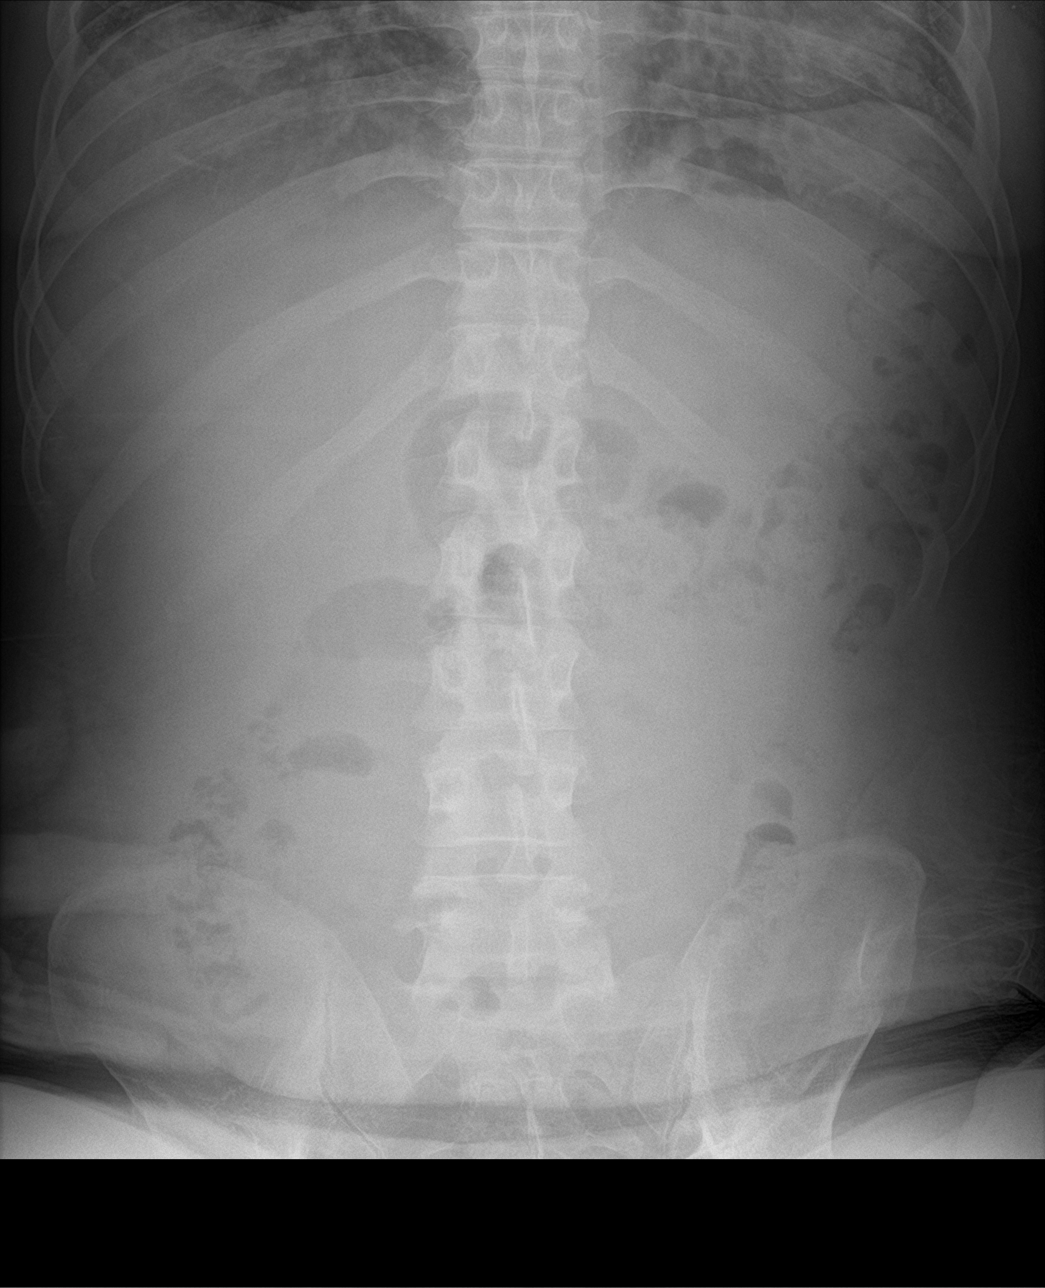

[abdomen supine (1 of 2)]
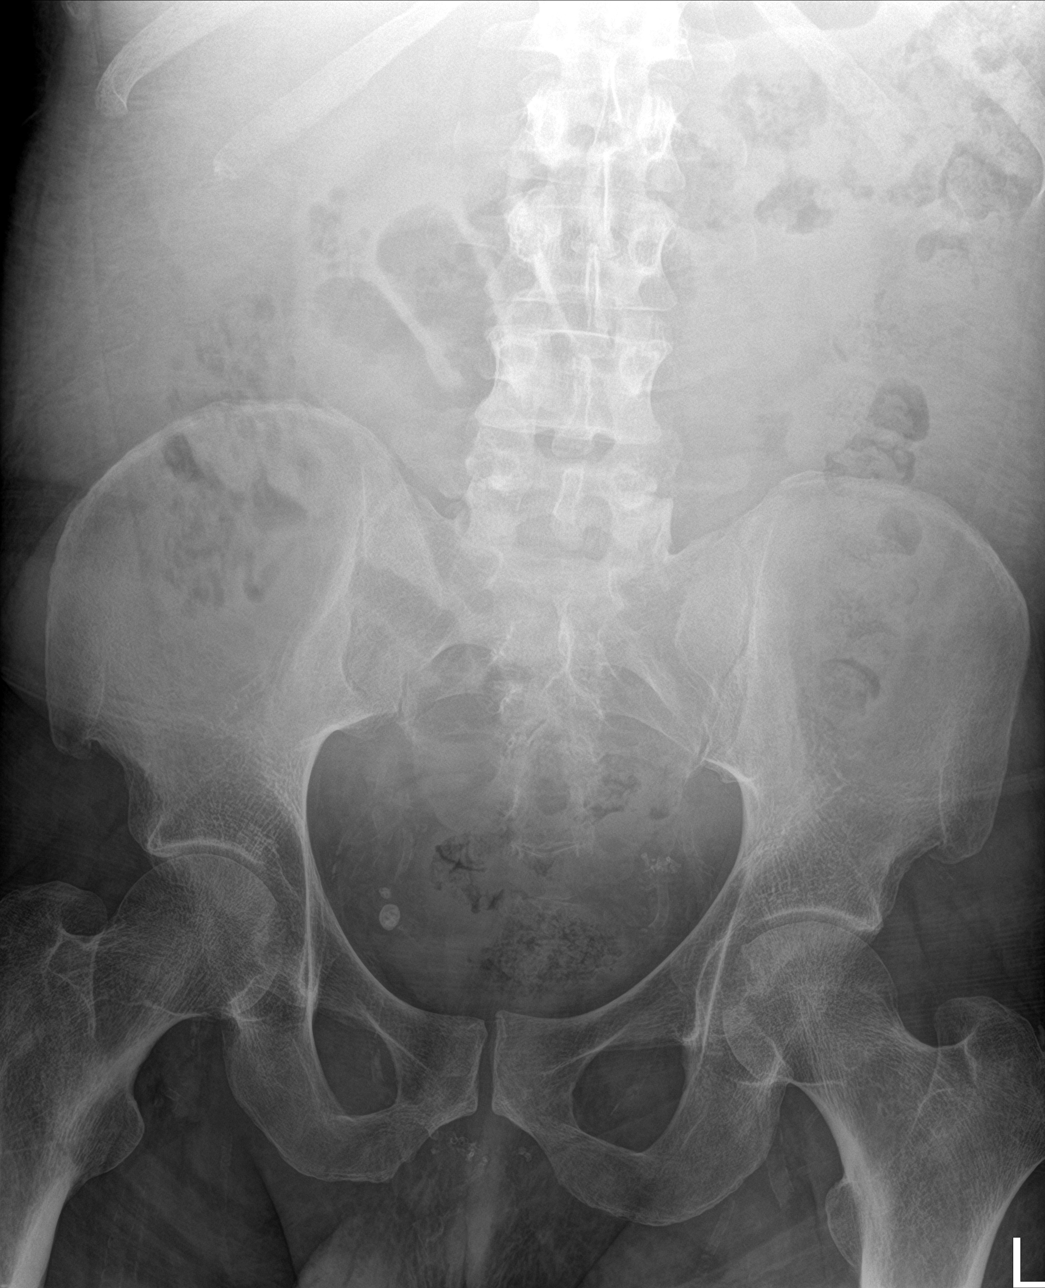

[abdomen supine (2 of 2)]
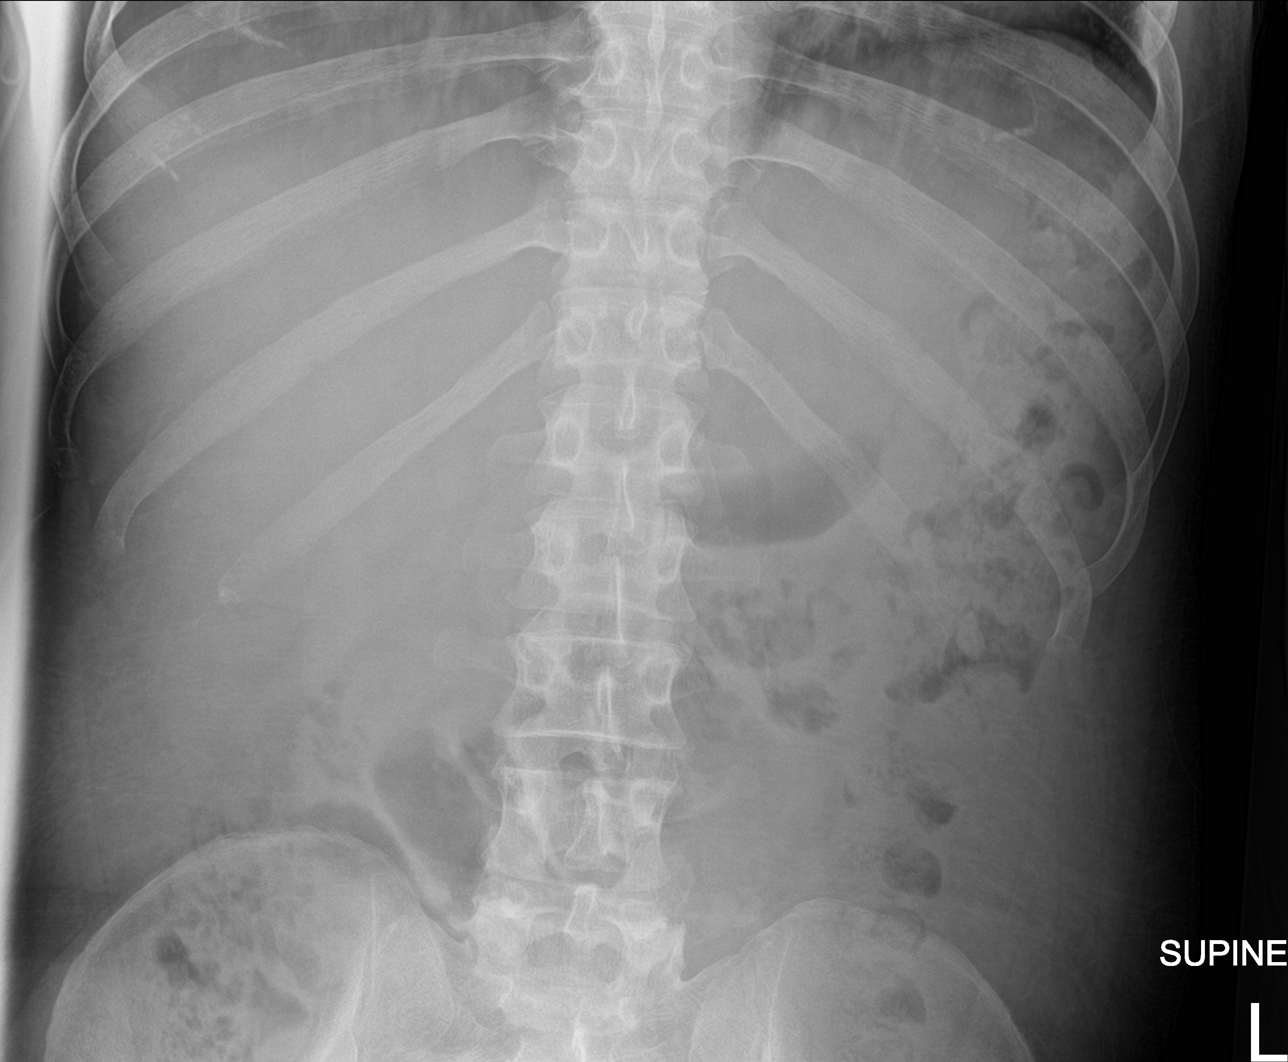

[chest ap]
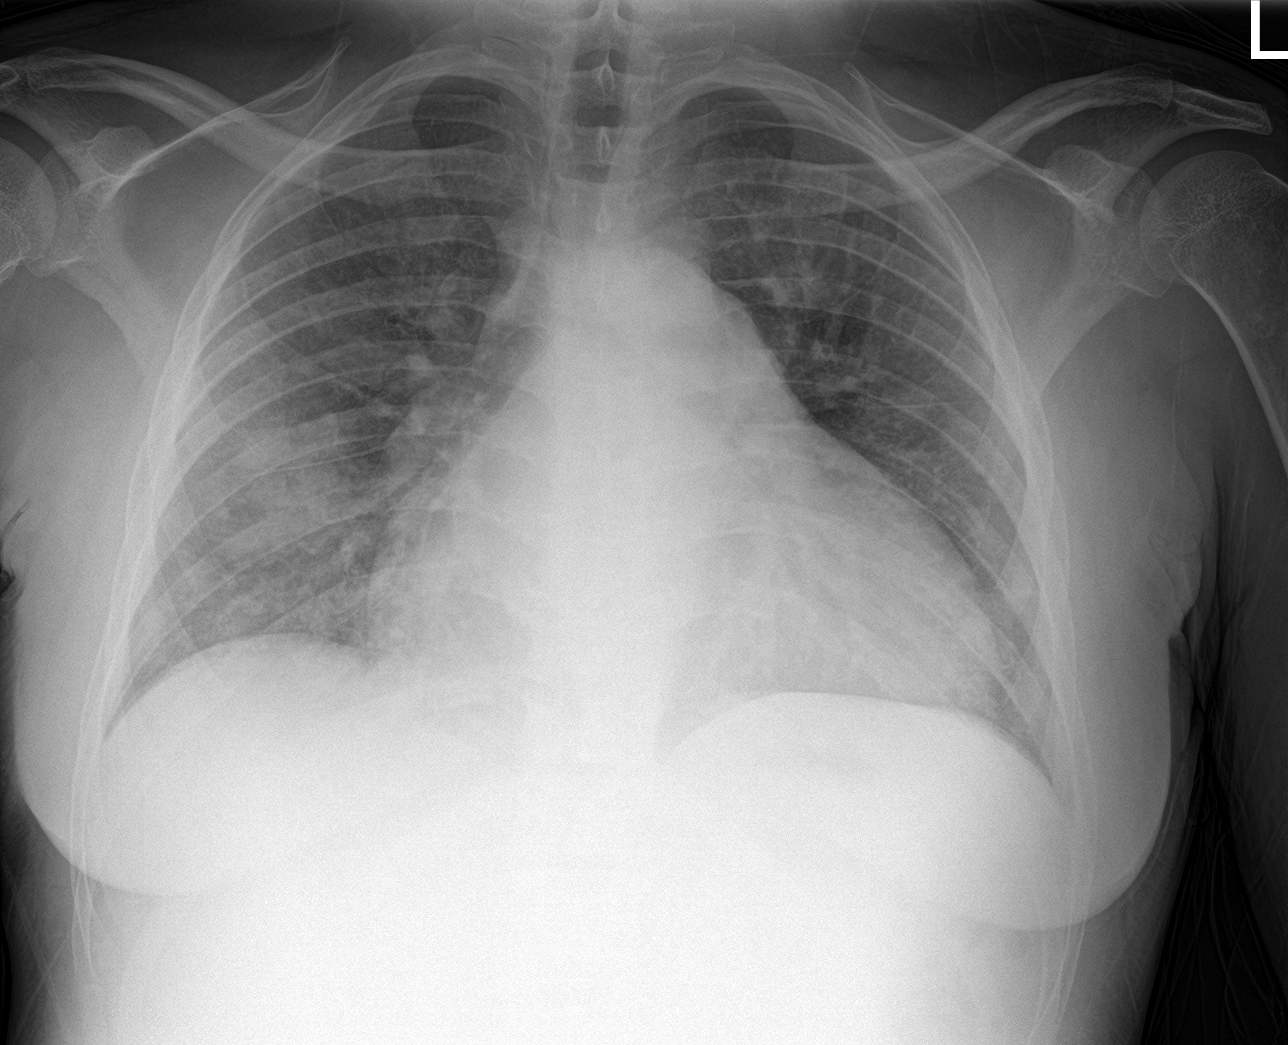

[4 of 4 positions shown; findings below may reference images not displayed]

FINDINGS: Formed stool is seen in the colon. No concerning mass effect or
calcification. No gas dilated bowel.

Cardiomegaly and vascular pedicle widening with diffuse hazy
pulmonary opacity asymmetric to the right lung. No effusion or
pneumothorax.
IMPRESSION: 1. Negative abdominal radiograph.
2. Airspace disease asymmetric to the right lung, history suggesting
edema or aspiration.

## 2022-04-14 IMAGING — US US ABDOMEN LIMITED
1 series · 14 of 25 positions shown · non-contrast
Comparison: CT abdomen/pelvis 02/29/2020

CLINICAL DATA: Right upper quadrant pain

EXAM:
ULTRASOUND ABDOMEN LIMITED RIGHT UPPER QUADRANT

[Series 1: us abdomen limited ruq (liver/gb) · 14 of 67 slices shown]
[im 1/67]
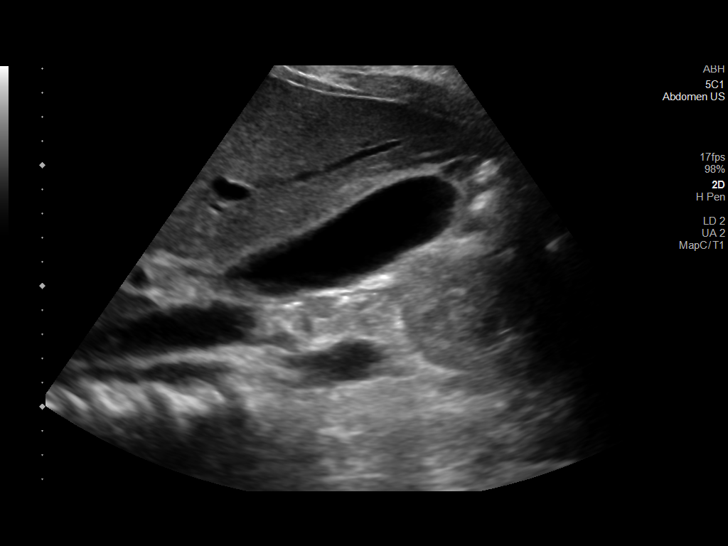
[im 6/67]
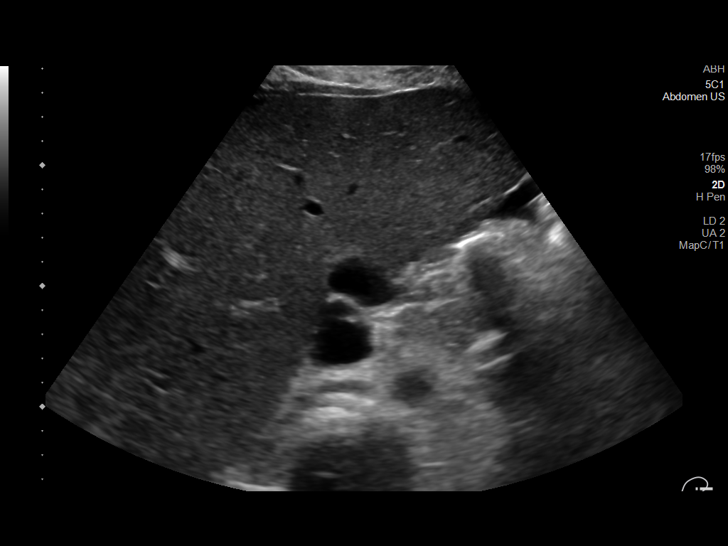
[im 12/67]
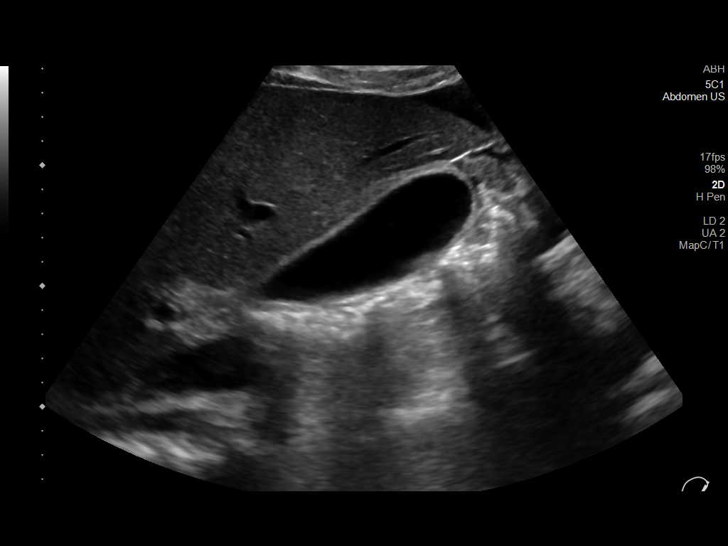
[im 17/67]
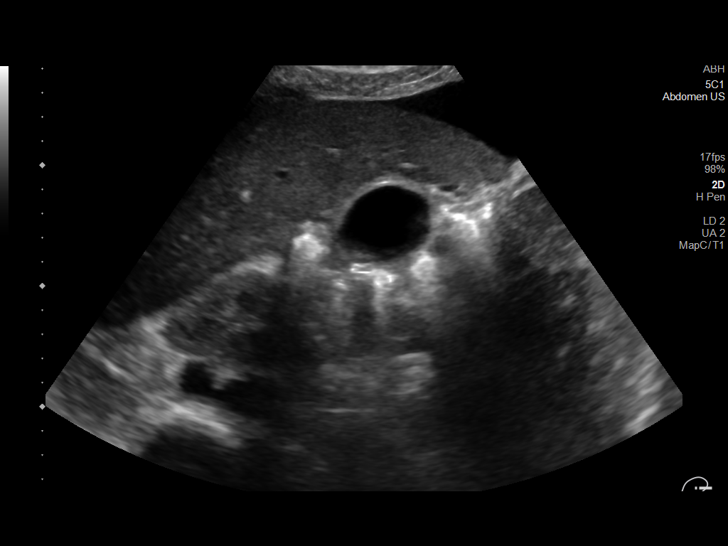
[im 23/67]
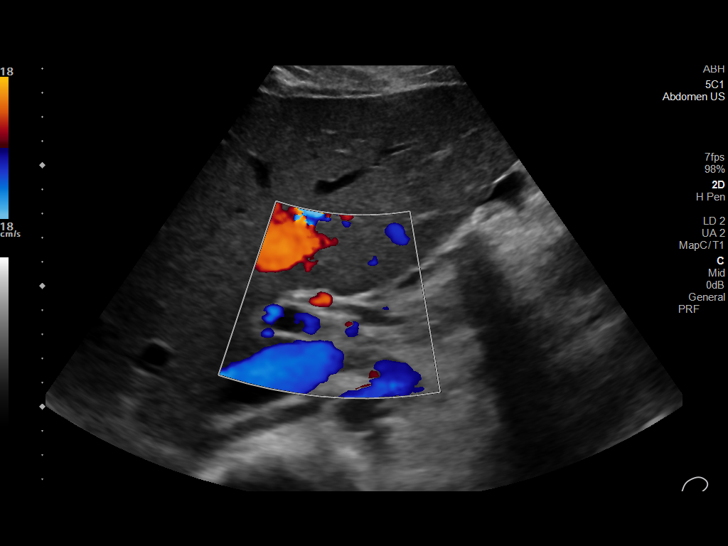
[im 25/67]
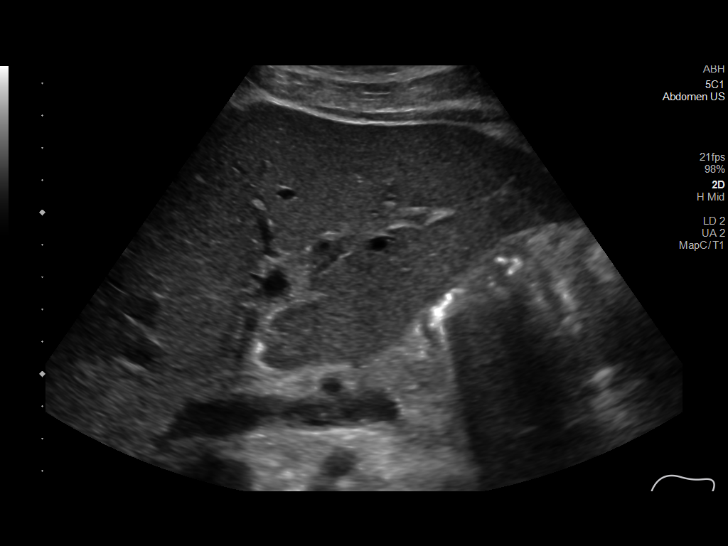
[im 31/67]
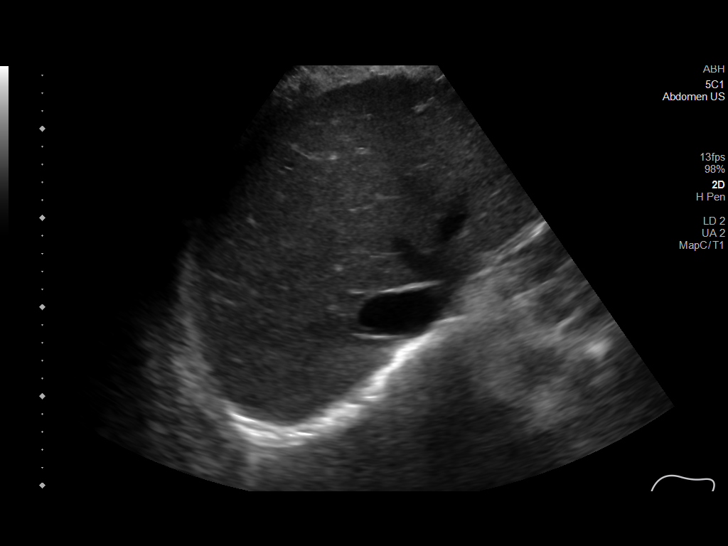
[im 36/67]
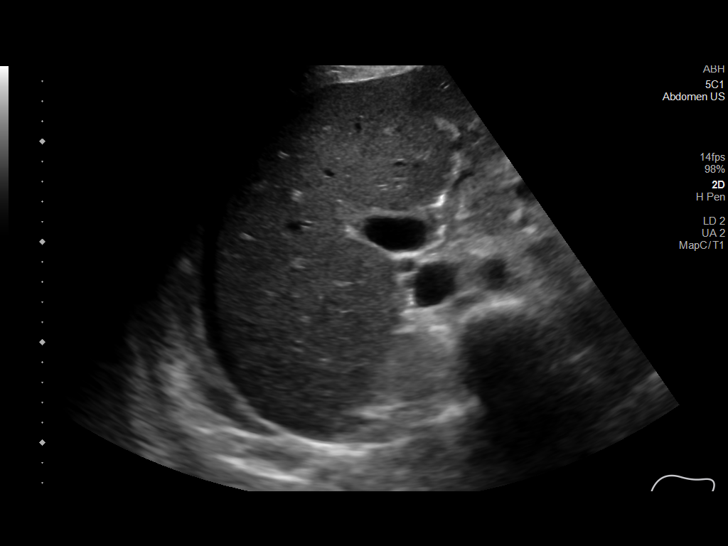
[im 42/67]
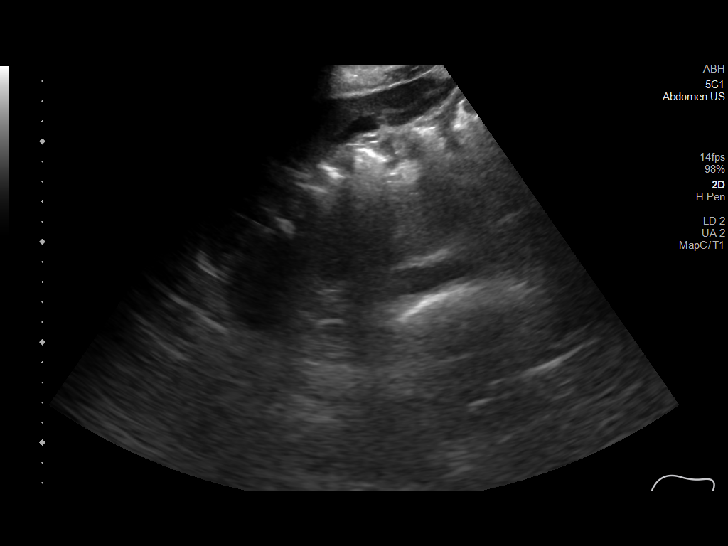
[im 45/67]
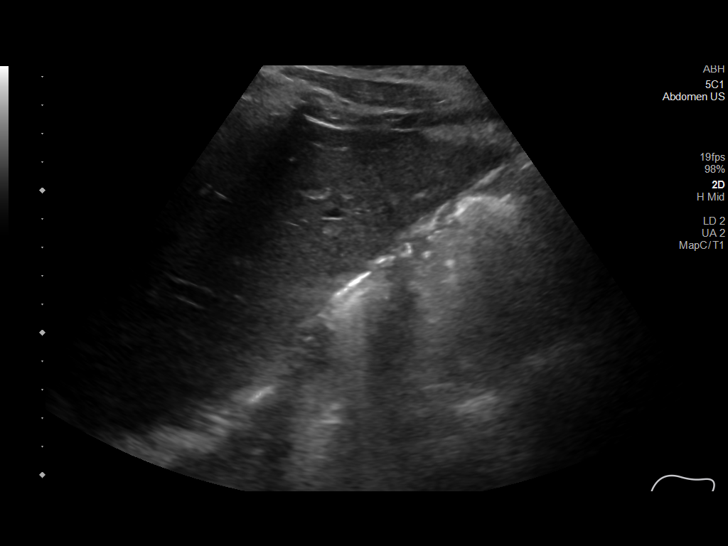
[im 50/67]
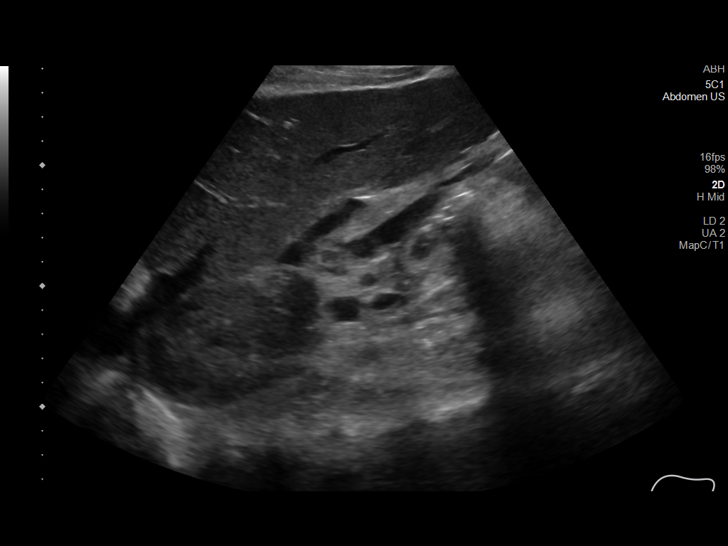
[im 56/67]
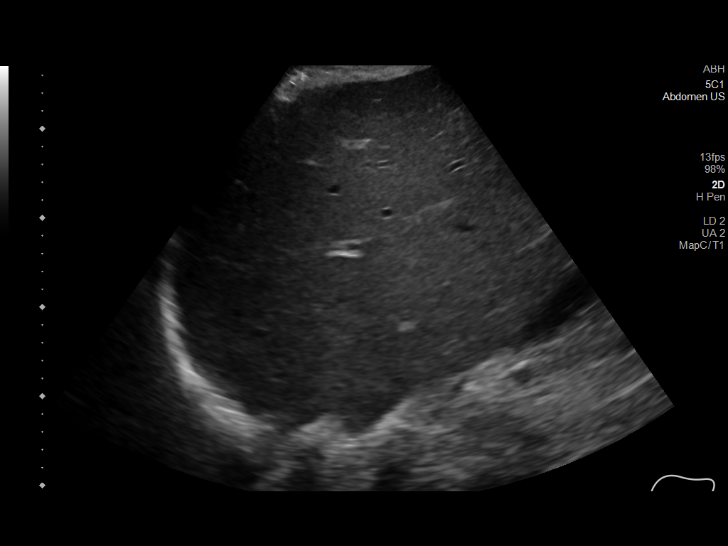
[im 61/67]
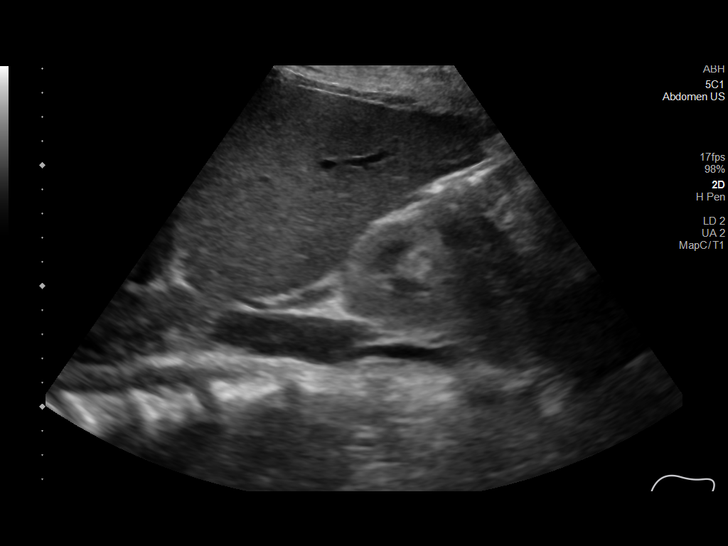
[im 67/67]
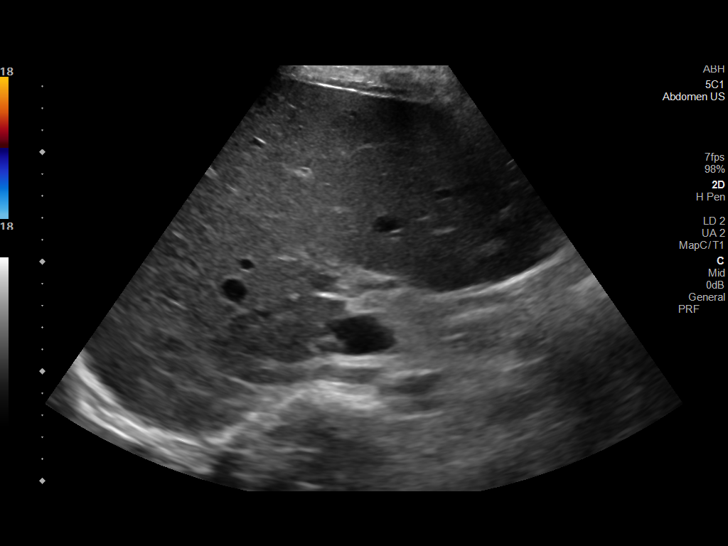

[14 of 25 positions shown; findings below may reference images not displayed]

FINDINGS: Gallbladder:

No gallstones or wall thickening visualized. No sonographic Murphy
sign noted by sonographer.

Common bile duct:

Diameter: 8 mm. There is no intrahepatic biliary ductal dilatation.
No obstructing lesion or stone is seen.

Liver:

No focal lesion identified. Within normal limits in parenchymal
echogenicity. Portal vein is patent on color Doppler imaging with
normal direction of blood flow towards the liver.

Other: There is mild ascites.
IMPRESSION: 1. Mild biliary ductal dilatation measuring up to 8 mm. No stone or
other obstructing lesion is seen. Correlate with LFTs, and MRCP may
be considered if indicated.
2. Mild ascites.

## 2022-06-30 NOTE — Progress Notes (Signed)
Subjective:    [name] - 36 y.o. male MRN [id number]  Date of birth: 1986-02-23  HPI  [name] is to establish care.  Current issues and/or concerns: - Reports he is diabetic. Taking "12 units of long acting insulin" daily. He tries to monitor what he eats. He does not check blood sugars outside of office routinely.  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 States vision issues began suddenly around 1 year ago. He is established with an eye doctor who said his diabetes caused his vision loss. States he "does what he has to do" in regard to caring for himself independently in the home. He would like to see if he is eligible for a home health nurse for assistance. He is an [employer]. When asked of the patient if he receives resources/assistance from them in addition to being an employee there he answered "No you ask them why." - He is a dialysis patient at [facility] on Tuesdays, Thursdays, and Saturdays. He is followed by Nephrology.  - Established with Cardiology for management of hypertension and cardiomyopathy.  - Established with Podiatry. History of osteomyelitis and s/p toe amputation.  - Established with Infectious Disease. Exposure to hepatitis B.  - He denies further issues/concerns for discussion on today.     ROS per HPI    Health Maintenance:  Health Maintenance Due  Topic Date Due   FOOT EXAM  Never done   OPHTHALMOLOGY EXAM  Never done   DTaP/Tdap/Td (1 - Tdap) Never done   INFLUENZA VACCINE  Never done   COVID-19 Vaccine (3 - 2023-24 season) 03/12/2022     Past Medical History: Patient Active Problem List   Diagnosis Date Noted   Osteomyelitis of fifth toe of right foot ([place]) 12/29/2021   Status post surgery 12/29/2021   Hypoglycemia 10/30/2021   ESRD (end stage renal disease) on dialysis ([place]) 03/12/2021   Hypertrophic cardiomyopathy ([place]) 02/26/2021   Syncope  02/08/2021   DKA (diabetic ketoacidosis) ([place]) 02/08/2021   Hyperglycemia    Diabetic foot ulcer ([place]) 11/24/2020   Hypervolemia associated with renal insufficiency 08/07/2020   Uremia 09/73/5329   Metabolic acidosis 92/42/6834   Nausea and vomiting 05/07/2020   Anemia of chronic kidney failure 05/07/2020   Dehydration    Hypertensive urgency 02/25/2020   Anemia 12/02/2019   Essential hypertension 12/02/2019   SOB (shortness of breath) 12/02/2019   Type 2 diabetes mellitus with ESRD (end-stage renal disease) ([place]) 12/02/2019   Diarrhea 03/23/2017   Early satiety 03/23/2017   Generalized abdominal pain 03/23/2017    Social History   reports that he has never smoked. He has never been exposed to tobacco smoke. He has never used smokeless tobacco. He reports that he does not currently use alcohol. He reports that he does not use drugs.   Family History  family history includes Cancer in his mother; Diabetes in his father; Hypertension in his father; Stroke in his father.   Medications: reviewed and updated   Objective:   Physical Exam BP 131/83 (BP Location: Left Arm, Patient Position: Sitting, Cuff Size: Large)   Pulse 75   Temp 98.3 F (36.8 C)   Resp 18   Ht 5\' 9"  (1.753 m)   Wt 198 lb (89.8 kg)   SpO2 96%   BMI 29.24 kg/m   Physical Exam HENT:     Head: Normocephalic and atraumatic.  Eyes:     Conjunctiva/sclera: Conjunctivae normal.  Cardiovascular:     Rate and Rhythm: Normal rate and regular rhythm.     Pulses: Normal pulses.     Heart sounds: Normal heart sounds.  Pulmonary:     Effort: Pulmonary effort is normal.     Breath sounds: Normal breath sounds.  Musculoskeletal:     Cervical back: Normal range of motion and neck supple.  Neurological:     General: No focal deficit present.     Mental Status: He is alert and oriented to person, place, and time.  Psychiatric:        Mood and Affect: Affect is angry.    Results for orders placed or performed  in visit on 07/09/22  POCT glycosylated hemoglobin (Hb A1C)  Result Value Ref Range   Hemoglobin A1C 6.0 (A) 4.0 - 5.6 %   HbA1c POC (<> result, manual entry)     HbA1c, POC (prediabetic range)     HbA1c, POC (controlled diabetic range)      Assessment & Plan:  1. Encounter to establish care - Patient presents today to establish care.  - Return for annual physical examination, labs, and health maintenance. Arrive fasting meaning having no food for at least 8 hours prior to appointment. You may have only water or black coffee. Please take scheduled medications as normal.  2. Type 2 diabetes mellitus with ESRD (end-stage renal disease) ([place]) - Hemoglobin A1c at goal at 6.0%, goal 7.0%. - Continue Insulin Glargine-YFGN as prescribed. Counseled on medication adherence.  - Discussed the importance of healthy eating habits, low-carbohydrate diet, low-sugar diet, regular aerobic exercise (at least 150 minutes a week as tolerated) and medication compliance to achieve or maintain control of diabetes. - Follow-up with primary provider in 3 months or sooner if needed.  - POCT glycosylated hemoglobin (Hb A1C) - insulin glargine-yfgn (SEMGLEE) 100 UNIT/ML injection; Inject 0.12 mLs (12 Units total) into the skin at bedtime.  Dispense: 10 mL; Refill: 0 - Insulin Pen Needle (PEN NEEDLES) 31G X 8 MM MISC; UAD  Dispense: 100 each; Refill: 0  3. ESRD on dialysis [facility]) - Keep all scheduled appointments with Nephrology and dialysis.   4. Blindness of left eye, unspecified right eye visual impairment category 5. Decreased vision of right eye -  Referred to [name], RN case manager for community resources and to see if he is eligible for a home health nurse.   6. Primary hypertension 7. Hypertrophic cardiomyopathy ([place]) - Keep all scheduled appointments with Cardiology.   8. Status post amputation of lesser toe of right foot ([place]) 9. Pain due to onychomycosis of toenails of both feet - Keep all  scheduled appointments with Podiatry.   10. Exposure to hepatitis B - Keep all scheduled appointments with Infectious Disease.     Patient was given clear instructions to go to Emergency Department or return to medical center if symptoms don't improve, worsen, or new problems develop.The patient verbalized understanding.  I discussed the assessment and treatment plan with the patient. The patient was provided an opportunity to ask questions and all were answered. The patient agreed with the plan and demonstrated an understanding of the instructions.   The patient was advised to call back or seek an in-person evaluation if the symptoms worsen or if the condition fails to improve as anticipated.    Durene Fruits, NP 07/09/2022, 3:05 PM Primary Care at [facility]

## 2022-07-09 ENCOUNTER — Ambulatory Visit (INDEPENDENT_AMBULATORY_CARE_PROVIDER_SITE_OTHER): Payer: Medicaid Other | Admitting: Family

## 2022-07-09 ENCOUNTER — Encounter: Payer: Self-pay | Admitting: Family

## 2022-07-09 ENCOUNTER — Telehealth: Payer: Self-pay | Admitting: Family

## 2022-07-09 VITALS — BP 131/83 | HR 75 | Temp 98.3°F | Resp 18 | Ht 69.0 in | Wt 198.0 lb

## 2022-07-09 DIAGNOSIS — H5461 Unqualified visual loss, right eye, normal vision left eye: Secondary | ICD-10-CM | POA: Diagnosis not present

## 2022-07-09 DIAGNOSIS — E1122 Type 2 diabetes mellitus with diabetic chronic kidney disease: Secondary | ICD-10-CM | POA: Diagnosis not present

## 2022-07-09 DIAGNOSIS — Z89421 Acquired absence of other right toe(s): Secondary | ICD-10-CM

## 2022-07-09 DIAGNOSIS — Z205 Contact with and (suspected) exposure to viral hepatitis: Secondary | ICD-10-CM

## 2022-07-09 DIAGNOSIS — I422 Other hypertrophic cardiomyopathy: Secondary | ICD-10-CM | POA: Diagnosis not present

## 2022-07-09 DIAGNOSIS — N186 End stage renal disease: Secondary | ICD-10-CM

## 2022-07-09 DIAGNOSIS — M79674 Pain in right toe(s): Secondary | ICD-10-CM

## 2022-07-09 DIAGNOSIS — M79675 Pain in left toe(s): Secondary | ICD-10-CM

## 2022-07-09 DIAGNOSIS — B351 Tinea unguium: Secondary | ICD-10-CM

## 2022-07-09 DIAGNOSIS — Z992 Dependence on renal dialysis: Secondary | ICD-10-CM

## 2022-07-09 DIAGNOSIS — Z7689 Persons encountering health services in other specified circumstances: Secondary | ICD-10-CM

## 2022-07-09 DIAGNOSIS — H5440 Blindness, one eye, unspecified eye: Secondary | ICD-10-CM

## 2022-07-09 DIAGNOSIS — I10 Essential (primary) hypertension: Secondary | ICD-10-CM

## 2022-07-09 LAB — POCT GLYCOSYLATED HEMOGLOBIN (HGB A1C): Hemoglobin A1C: 6 % — AB (ref 4.0–5.6)

## 2022-07-09 MED ORDER — INSULIN GLARGINE-YFGN 100 UNIT/ML ~~LOC~~ SOLN: 12.0000 [IU] | Freq: Every day | SUBCUTANEOUS | 0 refills | Status: DC

## 2022-07-09 MED ORDER — PEN NEEDLES 31G X 8 MM MISC: 0 refills | Status: DC

## 2022-07-09 NOTE — Patient Instructions (Signed)
Thank you for choosing Primary Care at [facility] for your medical home!    [name] was seen by Camillia Herter, NP today.   James Pavlov Pridgen's primary care provider is Durene Fruits, NP.   For the best care possible,  you should try to see Durene Fruits, NP whenever you come to office.   We look forward to seeing you again soon!  If you have any questions about your visit today,  please call us at [phone]  Or feel free to reach your provider via [place].    Keeping you healthy   Get these tests Blood pressure- Have your blood pressure checked once a year by your healthcare provider.  Normal blood pressure is 120/80. Weight- Have your body mass index (BMI) calculated to screen for obesity.  BMI is a measure of body fat based on height and weight. You can also calculate your own BMI at [employer]velB[employer].it. Cholesterol- Have your cholesterol checked regularly starting at age 79, sooner may be necessary if you have diabetes, high blood pressure, if a family member developed heart diseases at an early age or if you smoke.  Chlamydia, HIV, and other sexual transmitted disease- Get screened each year until the age of 104 then within three months of each new sexual partner. Diabetes- Have your blood sugar checked regularly if you have high blood pressure, high cholesterol, a family history of diabetes or if you are overweight.   Get these vaccines Flu shot- Every fall. Tetanus shot- Every 10 years. Menactra- Single dose; prevents meningitis.   Take these steps Don't smoke- If you do smoke, ask your healthcare provider about quitting. For tips on how to quit, go to www.smokefree.gov or call 1-800-QUIT-NOW. Be physically active- Exercise 5 days a week for at least 30 minutes.  If you are not already physically active start slow and gradually work up to 30 minutes of moderate physical activity.  Examples of moderate activity include walking briskly, mowing the yard, dancing,  swimming bicycling, etc. Eat a healthy diet- Eat a variety of healthy foods such as fruits, vegetables, low fat milk, low fat cheese, yogurt, lean meats, poultry, fish, beans, tofu, etc.  For more information on healthy eating, go to www.thenutritionsource.org Drink alcohol in moderation- Limit alcohol intake two drinks or less a day.  Never drink and drive. Dentist- Brush and floss teeth twice daily; visit your dentis twice a year. Depression-Your emotional health is as important as your physical health.  If you're feeling down, losing interest in things you normally enjoy please talk with your healthcare provider. Gun Safety- If you keep a gun in your home, keep it unloaded and with the safety lock on.  Bullets should be stored separately. Helmet use- Always wear a helmet when riding a motorcycle, bicycle, rollerblading or skateboarding. Safe sex- If you may be exposed to a sexually transmitted infection, use a condom Seat belts- Seat bels can save your life; always wear one. Smoke/Carbon Monoxide detectors- These detectors need to be installed on the appropriate level of your home.  Replace batteries at least once a year. Skin Cancer- When out in the sun, cover up and use sunscreen SPF 15 or higher. Violence- If anyone is threatening or hurting you, please tell your healthcare provider.

## 2022-07-09 NOTE — Progress Notes (Signed)
.  Pt presents to establish care,  -pt states he is totally blind can only see colors,  -needs in home nurse

## 2022-07-15 NOTE — Telephone Encounter (Signed)
I called the patient and he answered and asked me to call him back. He said Monday, 07/19/2022 would be fine

## 2022-07-15 NOTE — Telephone Encounter (Signed)
He needs assistance with activities of daily living. If you will please make patient aware information/updates. Thank you.

## 2022-07-19 NOTE — Telephone Encounter (Signed)
I spoke to the patient and he explained that he is moving into his own place in the middle of the month and is interested in having a nurse come to check on him.  I explained the role of the home health nurse and that there is no guarantee that we would be able to find an agency to accept the referral. The nurse would need to provide skilled care, ie: wound care, clinical assessment, education, and those services are short term.  I then explained that his insurance does provide personal care services which are more long term but the focus of those services is to provide assistance with bathing, dressing and ADLs.  The focus is not homemaking and [employer] will have a nurse that will come to the home to assess his needs. He then said that he doesn't need help with that.   I asked him if he has contacted [employer] and he said that contacting them is " pointless."  He thanked me for calling back and then hung up.

## 2022-07-20 ENCOUNTER — Telehealth: Payer: Self-pay | Admitting: Family

## 2022-07-20 NOTE — Telephone Encounter (Signed)
Copied from [place] [id number]. Topic: Referral - Request for Referral >> Jul 19, 2022  2:47 PM Erskine Squibb wrote: Has patient seen PCP for this complaint? Yes.   Referral for which specialty: Endocrinology Preferred provider/office: Endocrinology in [place] and insurance covers Reason for referral: Kidney issues. He sweats when he eats  Please assist patient further  Pt has appt scheduled on 07/23/22, can discuss with provider at that time.

## 2022-07-20 NOTE — Telephone Encounter (Signed)
Copied from [place] [phone]. Topic: General - Other >> Jul 19, 2022  5:08 PM Cyndi Bender wrote: Reason for CRM: Pt stated he had spoken with Opal Sidles and needs to speak back with her. Pt requests that Opal Sidles return his call at [phone]

## 2022-07-20 NOTE — Telephone Encounter (Signed)
I returned the call to the patient. He asked that I call him back tomorrow evening. I recommended that he call me back tomorrow when he is free and he said okay.

## 2022-07-20 NOTE — Progress Notes (Signed)
Erroneous encounter-disregard

## 2022-07-22 ENCOUNTER — Encounter (HOSPITAL_BASED_OUTPATIENT_CLINIC_OR_DEPARTMENT_OTHER): Payer: Medicaid Other | Attending: Internal Medicine | Admitting: Internal Medicine

## 2022-07-22 DIAGNOSIS — E11621 Type 2 diabetes mellitus with foot ulcer: Secondary | ICD-10-CM

## 2022-07-22 DIAGNOSIS — Z539 Procedure and treatment not carried out, unspecified reason: Secondary | ICD-10-CM | POA: Diagnosis present

## 2022-07-22 DIAGNOSIS — L97512 Non-pressure chronic ulcer of other part of right foot with fat layer exposed: Secondary | ICD-10-CM

## 2022-07-23 ENCOUNTER — Encounter: Payer: Medicaid Other | Admitting: Family

## 2022-07-23 DIAGNOSIS — Z13228 Encounter for screening for other metabolic disorders: Secondary | ICD-10-CM

## 2022-07-23 DIAGNOSIS — Z1329 Encounter for screening for other suspected endocrine disorder: Secondary | ICD-10-CM

## 2022-07-23 DIAGNOSIS — Z130 Encounter for screening for diseases of the blood and blood-forming organs and certain disorders involving the immune mechanism: Secondary | ICD-10-CM

## 2022-07-23 DIAGNOSIS — Z13220 Encounter for screening for lipoid disorders: Secondary | ICD-10-CM

## 2022-07-23 DIAGNOSIS — E119 Type 2 diabetes mellitus without complications: Secondary | ICD-10-CM

## 2022-07-23 DIAGNOSIS — Z0000 Encounter for general adult medical examination without abnormal findings: Secondary | ICD-10-CM

## 2022-07-30 ENCOUNTER — Ambulatory Visit (HOSPITAL_BASED_OUTPATIENT_CLINIC_OR_DEPARTMENT_OTHER): Payer: Medicaid Other | Admitting: Internal Medicine

## 2022-08-13 ENCOUNTER — Emergency Department (HOSPITAL_COMMUNITY): Payer: Medicaid Other

## 2022-08-13 ENCOUNTER — Encounter (HOSPITAL_COMMUNITY): Payer: Self-pay

## 2022-08-13 ENCOUNTER — Other Ambulatory Visit: Payer: Self-pay

## 2022-08-13 ENCOUNTER — Emergency Department (HOSPITAL_COMMUNITY)
Admission: EM | Admit: 2022-08-13 | Discharge: 2022-08-14 | Disposition: A | Discharge status: Home / Self Care | Payer: Medicaid Other | Attending: Emergency Medicine | Admitting: Emergency Medicine

## 2022-08-13 DIAGNOSIS — Z992 Dependence on renal dialysis: Secondary | ICD-10-CM | POA: Diagnosis not present

## 2022-08-13 DIAGNOSIS — Z79899 Other long term (current) drug therapy: Secondary | ICD-10-CM | POA: Insufficient documentation

## 2022-08-13 DIAGNOSIS — E1122 Type 2 diabetes mellitus with diabetic chronic kidney disease: Secondary | ICD-10-CM | POA: Insufficient documentation

## 2022-08-13 DIAGNOSIS — Z20822 Contact with and (suspected) exposure to covid-19: Secondary | ICD-10-CM | POA: Diagnosis not present

## 2022-08-13 DIAGNOSIS — R0600 Dyspnea, unspecified: Secondary | ICD-10-CM

## 2022-08-13 DIAGNOSIS — Z91158 Patient's noncompliance with renal dialysis for other reason: Secondary | ICD-10-CM

## 2022-08-13 DIAGNOSIS — R0602 Shortness of breath: Secondary | ICD-10-CM | POA: Diagnosis present

## 2022-08-13 DIAGNOSIS — Z794 Long term (current) use of insulin: Secondary | ICD-10-CM | POA: Insufficient documentation

## 2022-08-13 DIAGNOSIS — N186 End stage renal disease: Secondary | ICD-10-CM | POA: Diagnosis not present

## 2022-08-13 DIAGNOSIS — I120 Hypertensive chronic kidney disease with stage 5 chronic kidney disease or end stage renal disease: Secondary | ICD-10-CM | POA: Diagnosis not present

## 2022-08-13 LAB — RESP PANEL BY RT-PCR (RSV, FLU A&B, COVID)  RVPGX2
Influenza A by PCR: NEGATIVE
Influenza B by PCR: NEGATIVE
Resp Syncytial Virus by PCR: NEGATIVE
SARS Coronavirus 2 by RT PCR: NEGATIVE

## 2022-08-13 LAB — CBC WITH DIFFERENTIAL/PLATELET
Abs Immature Granulocytes: 0.02 10*3/uL (ref 0.00–0.07)
Basophils Absolute: 0 10*3/uL (ref 0.0–0.1)
Basophils Relative: 1 %
Eosinophils Absolute: 0.1 10*3/uL (ref 0.0–0.5)
Eosinophils Relative: 2 %
HCT: 36.3 % — ABNORMAL LOW (ref 39.0–52.0)
Hemoglobin: 11.5 g/dL — ABNORMAL LOW (ref 13.0–17.0)
Immature Granulocytes: 0 %
Lymphocytes Relative: 13 %
Lymphs Abs: 1 10*3/uL (ref 0.7–4.0)
MCH: 26.7 pg (ref 26.0–34.0)
MCHC: 31.7 g/dL (ref 30.0–36.0)
MCV: 84.2 fL (ref 80.0–100.0)
Monocytes Absolute: 0.7 10*3/uL (ref 0.1–1.0)
Monocytes Relative: 9 %
Neutro Abs: 5.7 10*3/uL (ref 1.7–7.7)
Neutrophils Relative %: 75 %
Platelets: 314 10*3/uL (ref 150–400)
RBC: 4.31 MIL/uL (ref 4.22–5.81)
RDW: 18.6 % — ABNORMAL HIGH (ref 11.5–15.5)
WBC: 7.6 10*3/uL (ref 4.0–10.5)
nRBC: 0 % (ref 0.0–0.2)

## 2022-08-13 LAB — COMPREHENSIVE METABOLIC PANEL
ALT: 13 U/L (ref 0–44)
AST: 12 U/L — ABNORMAL LOW (ref 15–41)
Albumin: 3.8 g/dL (ref 3.5–5.0)
Alkaline Phosphatase: 63 U/L (ref 38–126)
Anion gap: 18 — ABNORMAL HIGH (ref 5–15)
BUN: 72 mg/dL — ABNORMAL HIGH (ref 6–20)
CO2: 23 mmol/L (ref 22–32)
Calcium: 7.3 mg/dL — ABNORMAL LOW (ref 8.9–10.3)
Chloride: 96 mmol/L — ABNORMAL LOW (ref 98–111)
Creatinine, Ser: 15.15 mg/dL — ABNORMAL HIGH (ref 0.61–1.24)
GFR, Estimated: 4 mL/min — ABNORMAL LOW (ref >60)
Glucose, Bld: 108 mg/dL — ABNORMAL HIGH (ref 70–99)
Potassium: 5.1 mmol/L (ref 3.5–5.1)
Sodium: 137 mmol/L (ref 135–145)
Total Bilirubin: 0.5 mg/dL (ref 0.3–1.2)
Total Protein: 7.8 g/dL (ref 6.5–8.1)

## 2022-08-13 LAB — TROPONIN I (HIGH SENSITIVITY)
Troponin I (High Sensitivity): 41 ng/L — ABNORMAL HIGH (ref <18)
Troponin I (High Sensitivity): 38 ng/L — ABNORMAL HIGH (ref <18)

## 2022-08-13 LAB — CBG MONITORING, ED: Glucose-Capillary: 101 mg/dL — ABNORMAL HIGH (ref 70–99)

## 2022-08-13 LAB — BRAIN NATRIURETIC PEPTIDE: B Natriuretic Peptide: 1662.7 pg/mL — ABNORMAL HIGH (ref 0.0–100.0)

## 2022-08-13 LAB — HEPATITIS B SURFACE ANTIGEN: Hepatitis B Surface Ag: NONREACTIVE

## 2022-08-13 MED ORDER — CARVEDILOL 12.5 MG PO TABS: 25.0000 mg | ORAL_TABLET | Freq: Once | ORAL | Status: DC

## 2022-08-13 MED ORDER — CHLORHEXIDINE GLUCONATE CLOTH 2 % EX PADS: 6.0000 | MEDICATED_PAD | Freq: Every day | CUTANEOUS | Status: DC

## 2022-08-13 MED ORDER — ONDANSETRON HCL 4 MG/2ML IJ SOLN: 4.0000 mg | Freq: Once | INTRAMUSCULAR | Status: AC

## 2022-08-13 MED ORDER — CLONIDINE HCL 0.1 MG PO TABS: 0.1000 mg | ORAL_TABLET | Freq: Once | ORAL | Status: DC

## 2022-08-13 MED ORDER — ONDANSETRON HCL 4 MG/2ML IJ SOLN
INTRAMUSCULAR | Status: AC
  Administered 2022-08-13: 4 mg
  Filled 2022-08-13: qty 2

## 2022-08-13 NOTE — ED Provider Notes (Addendum)
[facility] Provider Note   CSN: [id number] Arrival date & time: 08/13/22  6433     History  Chief Complaint  Patient presents with   missed dialysis     [name] is a 37 y.o. male with ESRD on HD TuThSat, T2DM, HTN, hypertrophic cardiomyopathy, anemia of chronic kidney failure, h/o osteomyelitis, syncope, who p/w missed dialysis..    Pt complains of missed dialysis.  Patient states that he usually receives dialysis Tuesday Thursday and Saturday has not had dialysis session since Monday of which he received a full course. He could not go on Wednesday either, stating his days are off and he is vision impaired so he sometimes misses his sessions.  Currently complaining of shortness of breath and pain in lower extremities, pain all throughout his body/muscle soreness that he associates with needing dialysis.  Patient states the symptoms are similar to prior episodes of missed dialysis sessions.  States that he still is able to urinate around 1 time daily.  Denies fever, chest pain, abdominal pain, hematemesis, urinary symptoms, change in bowel habits, cough, congestion.   HPI     Home Medications Prior to Admission medications   Medication Sig Start Date End Date Taking? Authorizing Provider  acetaminophen (TYLENOL) 325 MG tablet Take 2 tablets (650 mg total) by mouth every 6 (six) hours as needed for mild pain (or Fever >/= 101). 12/30/21   Nita Sells, MD  atorvastatin (LIPITOR) 40 MG tablet Take 40 mg by mouth daily.    [provider]  B Complex-C-Folic Acid (RENA-VITE RX) 1 MG TABS Take 1 tablet by mouth daily. 06/28/22   [provider]  calcitRIOL (ROCALTROL) 0.5 MCG capsule Take 2 capsules (1 mcg total) by mouth Every Tuesday,Thursday,and Saturday with dialysis. 12/31/21   Nita Sells, MD  carvedilol (COREG) 25 MG tablet Take 1 tablet (25 mg total) by mouth 2 (two) times daily with a meal. 03/05/21    Samella Parr, NP  cloNIDine (CATAPRES) 0.1 MG tablet Take 0.1 mg by mouth 2 (two) times daily. 04/20/22   [provider]  insulin glargine-yfgn (SEMGLEE) 100 UNIT/ML injection Inject 0.12 mLs (12 Units total) into the skin at bedtime. 07/09/22   Camillia Herter, NP  insulin lispro (HUMALOG) 100 UNIT/ML injection Inject 1-7 Units into the skin See admin instructions. Per sliding scale 3 times daily with meals 151-200= 1 unit 201-250= 2 units 251-300= 3 units 301-350= 5 units 351-400= 7 units Greater than 400 call md takes only as needed if blood sugar is over 200    [provider]  Insulin Pen Needle (PEN NEEDLES) 31G X 8 MM MISC UAD 07/09/22   Camillia Herter, NP  Multiple Vitamins-Minerals (MULTIVITAMIN WITH MINERALS) tablet Take 1 tablet by mouth daily.    [provider]  multivitamin (RENA-VIT) TABS tablet Take 1 tablet by mouth at bedtime. Patient not taking: Reported on 10/30/2021 11/27/20   Lavina Hamman, MD  ondansetron (ZOFRAN) 4 MG tablet Take 4 mg by mouth every 8 (eight) hours as needed for nausea or vomiting. 04/12/21   [provider]  prednisoLONE acetate (PRED FORTE) 1 % ophthalmic suspension Place 1 drop into the left eye See admin instructions. 2-3 times daily 10/15/21   [provider]  sevelamer carbonate (RENVELA) 800 MG tablet Take 2 tablets (1,600 mg total) by mouth 3 (three) times daily with meals. Patient not taking: Reported on 12/29/2021 03/05/21   Erin Hearing  L, NP      Allergies    Amlodipine and Hydralazine    Review of Systems   Review of Systems Review of systems Negative for f/c.  A 10 point review of systems was performed and is negative unless otherwise reported in HPI.  Physical Exam Updated Vital Signs BP (!) 174/110   Pulse 75   Temp 98.3 F (36.8 C) (Oral)   Resp 16   Ht 6' (1.829 m)   Wt 88.5 kg   SpO2 97%   BMI 26.45 kg/m  Physical Exam General: Normal appearing male, lying in bed.   HEENT: Sclera anicteric, MMM, trachea midline.  Cardiology: RRR, no murmurs/rubs/gallops. BL radial and DP pulses equal bilaterally.  Resp: Normal respiratory rate and effort. CTAB, no wheezes, rhonchi, crackles.  Abd: Soft, non-tender, non-distended. No rebound tenderness or guarding.  GU: Deferred. MSK: No peripheral edema or signs of trauma. Extremities without deformity or TTP. No cyanosis or clubbing. Skin: warm, dry. No rashes or lesions. Back: No CVA tenderness Neuro: A&Ox4, CNs II-XII grossly intact. MAEs. Sensation grossly intact.  Psych: Normal mood and affect.   ED Results / Procedures / Treatments   Labs (all labs ordered are listed, but only abnormal results are displayed) Labs Reviewed  COMPREHENSIVE METABOLIC PANEL - Abnormal; Notable for the following components:      Result Value   Chloride 96 (*)    Glucose, Bld 108 (*)    BUN 72 (*)    Creatinine, Ser 15.15 (*)    Calcium 7.3 (*)    AST 12 (*)    GFR, Estimated 4 (*)    Anion gap 18 (*)    All other components within normal limits  CBC WITH DIFFERENTIAL/PLATELET - Abnormal; Notable for the following components:   Hemoglobin 11.5 (*)    HCT 36.3 (*)    RDW 18.6 (*)    All other components within normal limits  BRAIN NATRIURETIC PEPTIDE - Abnormal; Notable for the following components:   B Natriuretic Peptide 1,662.7 (*)    All other components within normal limits  TROPONIN I (HIGH SENSITIVITY) - Abnormal; Notable for the following components:   Troponin I (High Sensitivity) 41 (*)    All other components within normal limits  TROPONIN I (HIGH SENSITIVITY) - Abnormal; Notable for the following components:   Troponin I (High Sensitivity) 38 (*)    All other components within normal limits  RESP PANEL BY RT-PCR (RSV, FLU A&B, COVID)  [id number]  HEPATITIS B SURFACE ANTIGEN  HEPATITIS B SURFACE ANTIBODY, QUANTITATIVE    EKG EKG Interpretation  Date/Time:  Friday August 13 2022 10:50:56 EST Ventricular  Rate:  75 PR Interval:  191 QRS Duration: 96 QT Interval:  438 QTC Calculation: 490 R Axis:   84 Text Interpretation: Sinus rhythm Probable left ventricular hypertrophy Similar to prior EKGs Confirmed by Cindee Lame [phone]) on 08/13/2022 5:28:11 PM  Radiology DG Chest 1 View  Result Date: 08/13/2022 CLINICAL DATA:  Shortness of breath. EXAM: CHEST  1 VIEW COMPARISON:  Chest x-ray dated 10/30/2021. FINDINGS: Borderline cardiomegaly, stable. Lungs are clear. No pleural effusion or pneumothorax is seen. Osseous structures about the chest are unremarkable. IMPRESSION: No active disease. No evidence of pneumonia or pulmonary edema. Electronically Signed   By: Franki Cabot M.D.   On: 08/13/2022 10:13    Procedures Procedures    Medications Ordered in ED Medications  Chlorhexidine Gluconate Cloth 2 % PADS 6 each (has no administration in time range)  ED Course/ Medical Decision Making/ A&P                          Medical Decision Making Amount and/or Complexity of Data Reviewed Labs:  Decision-making details documented in ED Course. Radiology:  Decision-making details documented in ED Course.    This patient presents to the ED for concern of shortness of breath, missing dialysis; this involves an extensive number of treatment options, and is a complaint that carries with it a high risk of complications and morbidity.  I considered the following differential and admission for this acute, potentially life threatening condition. However, patient is HDS albeit moderately hypertensive and is well-appearing at this time. SORA.   MDM:    Patient is missed his dialysis for several days, consider electrolyte abnormalities, volume overload.  He is hypertensive into the 180s.  He is not significantly tachypneic or hypoxic on room air, and lower concern for acute pulmonary edema/flash pulmonary edema.  He does not report significant chest pain but w/ SOB will obtain EKG/trops. He is feeling like  his muscles are sore all over, states this is how he normally feels when he misses dialysis.  His symptoms are slightly vague, consider viral infection or pneumonia as well and will obtain swabs and chest x-ray.  Low overall concern for hypertensive emergency as patient is very well-appearing.  EKG does not demonstrate any signs of ischemia and is similar to his prior EKGs.  Clinical Course as of 08/13/22 1811  Fri Aug 13, 2022  1017 DG Chest 1 View FINDINGS: Borderline cardiomegaly, stable. Lungs are clear. No pleural effusion or pneumothorax is seen. Osseous structures about the chest are unremarkable.  IMPRESSION: No active disease. No evidence of pneumonia or pulmonary edema.   [HN]  1017 BP(!): 180/118 [HN]  1153 Resp panel by RT-PCR (RSV, Flu A&B, Covid) Anterior Nasal Swab Neg [HN]  1154 CBC with Differential(!) Unremarkable, Hgb c/w priors [HN]  1228 B Natriuretic Peptide(!): 1,662.7 [HN]  1228 Potassium: 5.1 [HN]  1228 Sodium: 137 [HN]  1229 Consulted to nephrology [HN]  7867 Will go for dialysis and return to ED for reevaluation and likely DC [HN]    Clinical Course User Index [HN] Audley Hose, MD    Labs: I Ordered, and personally interpreted labs.  The pertinent results include:  those listed above  Imaging Studies ordered: Imaging studies including CXR ordered from triage I independently visualized and interpreted imaging. I agree with the radiologist interpretation, which is no significant edema or acute findings  Additional history obtained from chart review.   Cardiac Monitoring: The patient was maintained on a cardiac monitor.  I personally viewed and interpreted the cardiac monitored which showed an underlying rhythm of: NSR  Reevaluation: After the interventions noted above, I reevaluated the patient and found that they have :stayed the same  Social Determinants of Health: Patient lives independently   Disposition:  Patient without significant  electrolyte abnormalities, and troponin is flat, though BNP elevated to 1662. He is complaining of SOB and cannot get into his dialysis center today. Patient will need to go for dialysis. Consulted to nephrology who will dialyze patient tonight. I believe patient would then be stable to be returned to ED, reevaluated, and likely discharged.   Patient is signed out to the oncoming ED physician Dr. Ashok Cordia who is made aware of his history, presentation, exam, workup, and plan.      Co morbidities that complicate the  patient evaluation  Past Medical History:  Diagnosis Date   ADHD (attention deficit hyperactivity disorder)    Diabetes mellitus    ESRD on hemodialysis ([place])    [facility]   High cholesterol    Hypertension    Hypertrophic cardiomyopathy ([place]) 02/26/2021   Seizure ([place]) 02/08/2021     Medicines Meds ordered this encounter  Medications   Chlorhexidine Gluconate Cloth 2 % PADS 6 each    I have reviewed the patients home medicines and have made adjustments as needed  Problem List / ED Course: Problem List Items Addressed This Visit   None Visit Diagnoses     Dyspnea, unspecified type    -  Primary   Dialysis patient, noncompliant                       This note was created using dictation software, which may contain spelling or grammatical errors.    Audley Hose, MD 08/13/22 1811    Audley Hose, MD 08/13/22 [phone]

## 2022-08-13 NOTE — ED Provider Triage Note (Signed)
Emergency Medicine Provider Triage Evaluation Note  [name] , a 37 y.o. male  was evaluated in triage.  Pt complains of missed dialysis.  Patient states that he usually receives dialysis Tuesday Thursday and Saturday has not had dialysis session since Monday of which she received for treatment 10.  Currently complaining of shortness of breath, nausea, vomiting and pain in lower extremities.  Patient states the symptoms are similar to prior episodes of missed dialysis sessions.  States that he still is able to urinate around 1 time daily.  Denies fever, chest pain, abdominal pain, hematemesis, urinary symptoms, change in bowel habits, cough, congestion.  Review of Systems  Positive: See above Negative:   Physical Exam  There were no vitals taken for this visit. Gen:   Awake, no distress   Resp:  Normal effort  MSK:   Moves extremities without difficulty  Other:    Medical Decision Making  Medically screening exam initiated at 9:43 AM.  Appropriate orders placed.  SAMMY CASSAR was informed that the remainder of the evaluation will be completed by another provider, this initial triage assessment does not replace that evaluation, and the importance of remaining in the ED until their evaluation is complete.     Wilnette Kales, [place] 08/13/22 [phone]

## 2022-08-13 NOTE — ED Notes (Signed)
Dialysis consent signed and at bedside

## 2022-08-13 NOTE — Progress Notes (Addendum)
Brief nephrology note 37 year old male ESRD on HD TTS at [facility], nonadherence with outpatient dialysis presented to the ER for dialysis needs.  The patient said that his last dialysis was on Monday 1/29.  He is hypertensive, hypoxic.  Potassium 5.1, BUN 72, creatinine level 15, elevated BNP, hemoglobin 11.5.  He has AV fistula for the access. I have seen and examined him in ER. We will plan to do dialysis today.  UF as tolerated.  Discussed with the dialysis nurse. He will go back to ER after HD for reassessment before discharge.  [facility].

## 2022-08-13 NOTE — ED Triage Notes (Signed)
Pt arrived POV stating he missed dialysis and feels like his fluid is building up. Pt states he has had N/V and some SHOB. Pt states he normally is a T,TH,S pt but his last scheduled dialysis was Monday.

## 2022-08-14 LAB — HEPATITIS B SURFACE ANTIBODY, QUANTITATIVE: Hep B S AB Quant (Post): >1000 m[IU]/mL (ref >9.9)

## 2022-08-14 NOTE — ED Provider Notes (Signed)
37 year old male initially seen and evaluated by previous provider, please see her note for complete H&P.  Patient missed his dialysis for several days and presented to the ED appearing hypertensive and vomiting overload.  And patient was admitted for dialysis.  He have received his dialysis session and now return back to the ER for reassessment.  Currently patient reported feeling better.  He feels comfortable going home.  He request for transportation.  He denies any active chest pain or shortness of breath.  On exam patient is laying in bed with his eyes closed and resting comfortably appears to be in no acute discomfort.  Heart with normal rate and rhythm, lungs otherwise clear abdomen soft nontender.  I have reviewed patient's labs, and EMR and have considered in the plan of care.  His current blood pressure is 143/88, he is afebrile, no hypoxia and at this time he is stable to be discharged.  Encourage patient to follow-up with his dialysis schedule as previously set.  BP (!) 143/88 (BP Location: Right Arm)   Pulse 81   Temp 98.7 F (37.1 C) (Oral)   Resp 16   Ht 6' (1.829 m)   Wt 88.5 kg   SpO2 97%   BMI 26.45 kg/m   Results for orders placed or performed during the hospital encounter of 08/13/22  Resp panel by RT-PCR (RSV, Flu A&B, Covid) Anterior Nasal Swab   Specimen: Anterior Nasal Swab  Result Value Ref Range   SARS Coronavirus 2 by RT PCR NEGATIVE NEGATIVE   Influenza A by PCR NEGATIVE NEGATIVE   Influenza B by PCR NEGATIVE NEGATIVE   Resp Syncytial Virus by PCR NEGATIVE NEGATIVE  Comprehensive metabolic panel  Result Value Ref Range   Sodium 137 135 - 145 mmol/L   Potassium 5.1 3.5 - 5.1 mmol/L   Chloride 96 (L) 98 - 111 mmol/L   CO2 23 22 - 32 mmol/L   Glucose, Bld 108 (H) 70 - 99 mg/dL   BUN 72 (H) 6 - 20 mg/dL   Creatinine, Ser 15.15 (H) 0.61 - 1.24 mg/dL   Calcium 7.3 (L) 8.9 - 10.3 mg/dL   Total Protein 7.8 6.5 - 8.1 g/dL   Albumin 3.8 3.5 - 5.0 g/dL   AST 12  (L) 15 - 41 U/L   ALT 13 0 - 44 U/L   Alkaline Phosphatase 63 38 - 126 U/L   Total Bilirubin 0.5 0.3 - 1.2 mg/dL   GFR, Estimated 4 (L) >60 mL/min   Anion gap 18 (H) 5 - 15  CBC with Differential  Result Value Ref Range   WBC 7.6 4.0 - 10.5 K/uL   RBC 4.31 4.22 - 5.81 MIL/uL   Hemoglobin 11.5 (L) 13.0 - 17.0 g/dL   HCT 36.3 (L) 39.0 - 52.0 %   MCV 84.2 80.0 - 100.0 fL   MCH 26.7 26.0 - 34.0 pg   MCHC 31.7 30.0 - 36.0 g/dL   RDW 18.6 (H) 11.5 - 15.5 %   Platelets 314 150 - 400 K/uL   nRBC 0.0 0.0 - 0.2 %   Neutrophils Relative % 75 %   Neutro Abs 5.7 1.7 - 7.7 K/uL   Lymphocytes Relative 13 %   Lymphs Abs 1.0 0.7 - 4.0 K/uL   Monocytes Relative 9 %   Monocytes Absolute 0.7 0.1 - 1.0 K/uL   Eosinophils Relative 2 %   Eosinophils Absolute 0.1 0.0 - 0.5 K/uL   Basophils Relative 1 %   Basophils Absolute 0.0  0.0 - 0.1 K/uL   Immature Granulocytes 0 %   Abs Immature Granulocytes 0.02 0.00 - 0.07 K/uL  Brain natriuretic peptide  Result Value Ref Range   B Natriuretic Peptide 1,662.7 (H) 0.0 - 100.0 pg/mL  Hepatitis B surface antigen  Result Value Ref Range   Hepatitis B Surface Ag NON REACTIVE NON REACTIVE  CBG monitoring, ED  Result Value Ref Range   Glucose-Capillary 101 (H) 70 - 99 mg/dL  Troponin I (High Sensitivity)  Result Value Ref Range   Troponin I (High Sensitivity) 41 (H) <18 ng/L  Troponin I (High Sensitivity)  Result Value Ref Range   Troponin I (High Sensitivity) 38 (H) <18 ng/L   DG Chest 1 View  Result Date: 08/13/2022 CLINICAL DATA:  Shortness of breath. EXAM: CHEST  1 VIEW COMPARISON:  Chest x-ray dated 10/30/2021. FINDINGS: Borderline cardiomegaly, stable. Lungs are clear. No pleural effusion or pneumothorax is seen. Osseous structures about the chest are unremarkable. IMPRESSION: No active disease. No evidence of pneumonia or pulmonary edema. Electronically Signed   By: Franki Cabot M.D.   On: 08/13/2022 10:13      Domenic Moras, PA-C 08/14/22 [id number]     Randal Buba, April, MD 08/14/22 [phone]

## 2022-08-14 NOTE — ED Notes (Signed)
Pt returned to ED after dialysis. He is unable to reach a ride for discharge transport at this time as it is 0400. Pt states that he does not have any ride apps (UBER/LYFT) because he is legally blind. Charge RN, Claiborne Billings, aware and pt will wait in ED until transportation can be called later in morning. Pt denies complaints and/or needs at this time

## 2022-08-14 NOTE — ED Notes (Signed)
Secretary called [employer] for patient

## 2022-08-14 NOTE — Discharge Instructions (Signed)
Please resume your dialysis schedule for further management of your health.

## 2022-08-16 ENCOUNTER — Encounter (HOSPITAL_BASED_OUTPATIENT_CLINIC_OR_DEPARTMENT_OTHER): Payer: Medicaid Other | Attending: Internal Medicine | Admitting: Internal Medicine

## 2022-08-30 NOTE — Progress Notes (Signed)
[address], [name] ([id number]) [id number] Page 1 of 8 Visit Report for 07/22/2022 Chief Complaint Document Details Patient Name: Date of Service: [name], ZEPP. 07/22/2022 8:00 A M Medical Record Number: [id number] Patient Account Number: [id number] Date of Birth/Sex: Treating RN: 07-12-1986 (37 y.o. M) Primary Care Provider: Durene Fruits Other Clinician: Referring Provider: Treating Provider/Extender: Wilber Oliphant, Amy Weeks in Treatment: 0 Information Obtained from: Patient Chief Complaint 07/22/2022; right foot wound Electronic Signature(s) Signed: 07/22/2022 9:59:16 AM By: Kalman Shan DO Entered By: Kalman Shan on 07/22/2022 09:22:34 -------------------------------------------------------------------------------- HPI Details Patient Name: Date of Service: James Noble M. 07/22/2022 8:00 A M Medical Record Number: [id number] Patient Account Number: [id number] Date of Birth/Sex: Treating RN: Nov 24, 1985 (37 y.o. 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 He is not sure how it started. He has not been doing any wound care and keeping this Area open to air. He has peripheral neuropathy And denies pain. He currently denies systemic signs of infection. Electronic Signature(s) Signed: 07/22/2022 9:59:16 AM By: Kalman Shan DO Entered By: Kalman Shan on 07/22/2022  09:25:42 -------------------------------------------------------------------------------- Physical Exam Details Patient Name: Date of Service: [name], James M. 07/22/2022 8:00 A M Medical Record Number: [id number] Patient Account Number: [id number] Date of Birth/Sex: Treating RN: Apr 03, 1986 (37 y.o. M) Primary Care Provider: Durene Fruits Other Clinician: Referring Provider: Treating Provider/Extender: Brennan Bailey [place], [place] ([id number]) [id number] Page 2 of 8 Weeks in Treatment: 0 Constitutional respirations regular, non-labored and within target range for patient.. Cardiovascular 2+ dorsalis pedis/posterior tibialis pulses. Psychiatric pleasant and cooperative. Notes Right foot: T the webbing between the first and second toe there is an open wound with granulation tissue and slight maceration to the periwound. No signs of o surrounding soft tissue infection including increased warmth, erythema or purulent drainage. Electronic Signature(s) Signed: 07/22/2022 9:59:16 AM By: Kalman Shan DO Entered By: Kalman Shan on 07/22/2022 09:26:50 -------------------------------------------------------------------------------- Physician Orders Details Patient Name: Date of Service: James Noble M. 07/22/2022 8:00 A M Medical Record Number: [id number] Patient Account Number: [id number] Date of Birth/Sex: Treating RN: 1986-03-11 (37 y.o. [name] Primary Care Provider: Durene Fruits Other Clinician: Referring Provider: Treating Provider/Extender: Wilber Oliphant, Amy Weeks in Treatment: 0 Verbal / Phone Orders: No Diagnosis Coding Follow-up Appointments ppointment in 1 week. - w/ Dr. Heber [place] Return A Other: - Buy an antifungal spray to put between all the toes. This can be over the counter at any pharmacy. Anesthetic (In clinic) Topical Lidocaine 5% applied to wound bed Bathing/ Shower/ Hygiene May  shower with protection but do not get wound dressing(s) wet. Protect dressing(s) with water repellant cover (for example, large plastic bag) or a cast cover and may then take shower. Edema Control - Lymphedema / SCD / Other Elevate legs to the level of the heart or above for 30 minutes daily and/or when sitting for 3-4 times a day throughout the day. Avoid standing for long periods of time. Wound Treatment Wound #1 - T - Web between 1st and 2nd oe Wound Laterality: Right Cleanser: Soap and Water 1  x Per [id number] Days Discharge Instructions: May shower and wash wound with dial antibacterial soap and water prior to dressing change. Cleanser: Wound Cleanser 1 x Per Day/15 Days Discharge Instructions: Cleanse the wound with wound cleanser prior to applying a clean dressing using gauze sponges, not tissue or cotton balls. Cleanser: [employer] Ancillary Kit - 15 Day Supply (DME) (Generic) 1 x Per Day/15 Days Discharge Instructions: Use supplies as instructed; Kit contains: (15) Saline Bullets; (15) 3x3 Gauze; 15 pr Gloves Topical: Gentamicin 1 x Per Day/15 Days Discharge Instructions: As directed by physician Topical: antifungal spray 1 x Per Day/15 Days Discharge Instructions: apply between all toes Secondary Dressing: Woven Gauze Sponge, Non-Sterile 4x4 in (DME) (Generic) 1 x Per Day/15 Days Discharge Instructions: Apply over primary dressing as directed. [address], [name] ([id number]) [id number] Page 3 of 8 Patient Medications llergies: amlodipine, hydralazine A Notifications Medication Indication Start End PRN debridements/pain1/05/2023 lidocaine DOSE topical 5 % gel - gel topical once daily 07/22/2022 gentamicin DOSE 1 - topical 0.1 % cream - apply once daily to the wound bed Electronic Signature(s) Signed: 07/22/2022 9:59:16 AM By: Kalman Shan DO Previous Signature: 07/22/2022 8:59:07 AM Version By: Kalman Shan DO Entered By: Kalman Shan on 07/22/2022  09:27:00 -------------------------------------------------------------------------------- Problem List Details Patient Name: Date of Service: James Noble M. 07/22/2022 8:00 A M Medical Record Number: [id number] Patient Account Number: [id number] Date of Birth/Sex: Treating RN: [birth year] (37 y.o. M) Primary Care Provider: Durene Fruits Other Clinician: Referring Provider: Treating Provider/Extender: Wilber Oliphant, Amy Weeks in Treatment: 0 Active Problems ICD-10 Encounter Code Description Active Date MDM Diagnosis L97.512 Non-pressure chronic ulcer of other part of right foot with fat layer exposed 07/22/2022 No Yes E10.621 Type 1 diabetes mellitus with foot ulcer 07/22/2022 No Yes N18.6 End stage renal disease 07/22/2022 No Yes Inactive Problems Resolved Problems Electronic Signature(s) Signed: 07/22/2022 9:59:16 AM By: Kalman Shan DO Entered By: Kalman Shan on 07/22/2022 09:22:08 -------------------------------------------------------------------------------- Progress Note Details Patient Name: Date of Service: James Noble M. 07/22/2022 8:00 A M Medical Record Number: [id number] Patient Account Number: [id number] Date of Birth/Sex: Treating RN: [birth year] (37 y.o. M) Primary Care Provider: Durene Fruits Other Clinician: Referring Provider: Treating Provider/Extender: Brennan Bailey Elmer, [place] ([id number]) [id number] Page 4 of 8 Weeks in Treatment: 0 Subjective Chief Complaint Information obtained from Patient 07/22/2022; right foot wound History of Present Illness (HPI) 07/22/2022 [name] is a 37 year old male with a past medical history of type 1 diabetes, end-stage renal disease on hemodialysis, and history of osteomyelitis status post right fifth ray amputation that presents the clinic for a several month history of nonhealing ulcer between the webbing of his first and second right toes.  He is not sure how it started. He has not been doing any wound care and keeping this Area open to air. He has peripheral neuropathy And denies pain. He currently denies systemic signs of infection. Patient History Information obtained from Patient, Chart. Allergies amlodipine, hydralazine Family History Unknown History. Social History Never smoker, Marital Status - Single, Alcohol Use - Never, Drug Use - No History, Caffeine Use - Rarely. Medical History Cardiovascular Patient has history of Hypertension Endocrine Patient has history of Type II Diabetes Genitourinary Patient has history of End Stage Renal Disease - on hemodialysis Hospitalization/Surgery History - amputation right toe fifth Dr. Blenda Mounts 06/23. - fistual superficialization (Left). - AV fistuala placement. - Ir US guide vasc access right. - IandD left extremity. - fracture surgery. Medical A Surgical History Notes  nd Cardiovascular hyperlipidemia; hypertrophic cardiomyopathy; Genitourinary on hemodialysis Neurologic ADHD Review of Systems (ROS) Constitutional Symptoms (General Health) Denies complaints or symptoms of Fatigue, Fever, Chills, Marked Weight Change. Eyes Complains or has symptoms of Vision Changes - vision impaired. Denies complaints or symptoms of Dry Eyes, Glasses / Contacts. Ear/Nose/Mouth/Throat Denies complaints or symptoms of Chronic sinus problems or rhinitis. Respiratory Denies complaints or symptoms of Chronic or frequent coughs, Shortness of Breath. Gastrointestinal Denies complaints or symptoms of Frequent diarrhea, Nausea, Vomiting. Integumentary (Skin) Complains or has symptoms of Wounds. Musculoskeletal Denies complaints or symptoms of Muscle Pain, Muscle Weakness. Neurologic Denies complaints or symptoms of Numbness/parasthesias. Psychiatric Denies complaints or symptoms of Claustrophobia. Objective Constitutional respirations regular, non-labored and within target range for  patient.. Vitals Time Taken: 8:11 AM, Temperature: 98.1 F, Pulse: 74 bpm, Respiratory Rate: 17 breaths/min, Blood Pressure: 204/135 mmHg. Cardiovascular 2+ dorsalis pedis/posterior tibialis pulses. [address], [name] ([id number]) [id number] Page 5 of 8 Psychiatric pleasant and cooperative. General Notes: Right foot: T the webbing between the first and second toe there is an open wound with granulation tissue and slight maceration to the o periwound. No signs of surrounding soft tissue infection including increased warmth, erythema or purulent drainage. Integumentary (Hair, Skin) Wound #1 status is Open. Original cause of wound was Gradually Appeared. The date acquired was: 07/12/2021. The wound is located on the Right T - Web oe between 1st and 2nd. The wound measures 2.5cm length x 1.5cm width x 0.2cm depth; 2.945cm^2 area and 0.589cm^3 volume. There is no tunneling or undermining noted. There is a medium amount of serosanguineous drainage noted. The wound margin is distinct with the outline attached to the wound base. There is large (67-100%) red, pink granulation within the wound bed. There is a small (1-33%) amount of necrotic tissue within the wound bed including Adherent Slough. Assessment Active Problems ICD-10 Non-pressure chronic ulcer of other part of right foot with fat layer exposed Type 1 diabetes mellitus with foot ulcer End stage renal disease Patient presents with a several month history of nonhealing ulcer to the right foot in between the web of the first and second toe in the setting of diabetes. He probably has a component of bacterial and antifungal infection on the surface. I recommended using gentamicin ointment and antifungal spray daily. He can keep this area covered with gauze. Follow-up in 1 week. Plan Follow-up Appointments: Return Appointment in 1 week. - w/ Dr. Heber [place] Other: - Buy an antifungal spray to put between all the toes. This  can be over the counter at any pharmacy. Anesthetic: (In clinic) Topical Lidocaine 5% applied to wound bed Bathing/ Shower/ Hygiene: May shower with protection but do not get wound dressing(s) wet. Protect dressing(s) with water repellant cover (for example, large plastic bag) or a cast cover and may then take shower. Edema Control - Lymphedema / SCD / Other: Elevate legs to the level of the heart or above for 30 minutes daily and/or when sitting for 3-4 times a day throughout the day. Avoid standing for long periods of time. The following medication(s) was prescribed: lidocaine topical 5 % gel gel topical once daily for PRN debridements/pain was prescribed at facility gentamicin topical 0.1 % cream 1 apply once daily to the wound bed starting 07/22/2022 WOUND #1: - T - Web between 1st and 2nd Wound Laterality: Right oe Cleanser: Soap and Water 1 x Per Day/15 Days Discharge Instructions: May shower and wash wound with dial antibacterial soap and water prior to dressing change.  Cleanser: Wound Cleanser 1 x Per Day/15 Days Discharge Instructions: Cleanse the wound with wound cleanser prior to applying a clean dressing using gauze sponges, not tissue or cotton balls. Cleanser: [employer] Ancillary Kit - 15 Day Supply (DME) (Generic) 1 x Per Day/15 Days Discharge Instructions: Use supplies as instructed; Kit contains: (15) Saline Bullets; (15) 3x3 Gauze; 15 pr Gloves Topical: Gentamicin 1 x Per Day/15 Days Discharge Instructions: As directed by physician Topical: antifungal spray 1 x Per Day/15 Days Discharge Instructions: apply between all toes Secondary Dressing: Woven Gauze Sponge, Non-Sterile 4x4 in (DME) (Generic) 1 x Per Day/15 Days Discharge Instructions: Apply over primary dressing as directed. 1. Gentamicin ointment 2. Antifungal spray 3. Follow-up in 1 week Electronic Signature(s) Signed: 07/22/2022 9:59:16 AM By: Kalman Shan DO Entered By: Kalman Shan on 07/22/2022  09:28:31 Berthold, [name] ([id number]) [id number] Page 6 of 8 -------------------------------------------------------------------------------- HxROS Details Patient Name: Date of Service: [name], GEIMER. 07/22/2022 8:00 A M Medical Record Number: [id number] Patient Account Number: [id number] Date of Birth/Sex: Treating RN: 08/12/1985 (37 y.o. [name] Primary Care Provider: Durene Fruits Other Clinician: Referring Provider: Treating Provider/Extender: Wilber Oliphant, Amy Weeks in Treatment: 0 Information Obtained From Patient Chart Constitutional Symptoms (General Health) Complaints and Symptoms: Negative for: Fatigue; Fever; Chills; Marked Weight Change Eyes Complaints and Symptoms: Positive for: Vision Changes - vision impaired Negative for: Dry Eyes; Glasses / Contacts Ear/Nose/Mouth/Throat Complaints and Symptoms: Negative for: Chronic sinus problems or rhinitis Respiratory Complaints and Symptoms: Negative for: Chronic or frequent coughs; Shortness of Breath Gastrointestinal Complaints and Symptoms: Negative for: Frequent diarrhea; Nausea; Vomiting Integumentary (Skin) Complaints and Symptoms: Positive for: Wounds Musculoskeletal Complaints and Symptoms: Negative for: Muscle Pain; Muscle Weakness Neurologic Complaints and Symptoms: Negative for: Numbness/parasthesias Medical History: Past Medical History Notes: ADHD Psychiatric Complaints and Symptoms: Negative for: Claustrophobia Hematologic/Lymphatic Cardiovascular Medical History: Positive for: Hypertension Past Medical History Notes: hyperlipidemia; hypertrophic cardiomyopathy; Endocrine Medical History: Positive for: Type II Diabetes Krasowski, [name] ([id number]) [id number] Page 7 of 8 Genitourinary Medical History: Positive for: End Stage Renal Disease - on hemodialysis Past Medical History Notes: on  hemodialysis Immunological Oncologic Immunizations Pneumococcal Vaccine: Received Pneumococcal Vaccination: No Implantable Devices None Hospitalization / Surgery History Type of Hospitalization/Surgery amputation right toe fifth Dr. Blenda Mounts 06/23 fistual superficialization (Left) AV fistuala placement Ir US guide vasc access right IandD left extremity fracture surgery Family and Social History Unknown History: Yes; Never smoker; Marital Status - Single; Alcohol Use: Never; Drug Use: No History; Caffeine Use: Rarely; Financial Concerns: No; Food, Clothing or Shelter Needs: No; Support System Lacking: No; Transportation Concerns: No Electronic Signature(s) Signed: 07/22/2022 9:59:16 AM By: Kalman Shan DO Signed: 08/30/2022 10:40:58 AM By: Rhae Hammock RN Entered By: Rhae Hammock on 07/22/2022 08:14:05 -------------------------------------------------------------------------------- SuperBill Details Patient Name: Date of Service: James Noble M. 07/22/2022 Medical Record Number: [id number] Patient Account Number: [id number] Date of Birth/Sex: Treating RN: Jan 17, 1986 (38 y.o. Burnadette Pop, Lauren Primary Care Provider: Durene Fruits Other Clinician: Referring Provider: Treating Provider/Extender: Wilber Oliphant, Amy Weeks in Treatment: 0 Diagnosis Coding ICD-10 Codes Code Description E11.621 Type 2 diabetes mellitus with foot ulcer L97.512 Non-pressure chronic ulcer of other part of right foot with fat layer exposed Facility Procedures : CPT4 Code: [id number] Description: 99214 - WOUND CARE VISIT-LEV 4 EST PT Modifier: Quantity: 1 Physician Procedures : CPT4 Code Description Modifier [id number] - WC PHYS LEVEL 3 - EST PT ICD-10 Diagnosis Description E11.621 Type 2 diabetes mellitus with foot ulcer L97.512 Non-pressure chronic  ulcer of other part of right foot with fat layer exposed Face, [name]  ([id number])  [id number] Quantity: 1 Page 8 of 8 Electronic Signature(s) Signed: 07/22/2022 9:59:16 AM By: Kalman Shan DO Entered By: Kalman Shan on 07/22/2022 [id number]

## 2022-08-30 NOTE — Progress Notes (Addendum)
[address], [name] ([id number]) [id number] Page 1 of 8 Visit Report for 07/22/2022 Allergy List Details Patient Name: Date of Service: [name], [name]. 07/22/2022 8:00 A Velez Medical Record Number: [id number] Patient Account Number: [id number] Date of Birth/Sex: Treating RN: August 17, 1985 (36 y.o. Male) Fonnie Mu Primary Care Evangelina Delancey: Ricky Stabs Other Clinician: Referring Damion Kant: Treating Elward Nocera/Extender: Bonna Gains, Amy Weeks in Treatment: 0 Allergies Active Allergies amlodipine hydralazine Allergy Notes Electronic Signature(s) Signed: 08/30/2022 10:40:58 AM By: Fonnie Mu RN Entered By: Fonnie Mu on 07/22/2022 08:11:38 -------------------------------------------------------------------------------- Arrival Information Details Patient Name: Date of Service: [name]. 07/22/2022 8:00 A Velez Medical Record Number: [id number] Patient Account Number: [id number] Date of Birth/Sex: Treating RN: 1985-07-16 (36 y.o. Male) Fonnie Mu Primary Care Shemiah Rosch: Ricky Stabs Other Clinician: Referring Malachai Schalk: Treating Dorann Davidson/Extender: Bonna Gains, Amy Weeks in Treatment: 0 Visit Information Patient Arrived: Ambulatory Arrival Time: 07:59 Accompanied By: self Transfer Assistance: None Patient Identification Verified: Yes Secondary Verification Process Completed: Yes Patient Requires Transmission-Based Precautions: No Patient Has Alerts: Yes Patient Alerts: ABI: N/C Electronic Signature(s) Signed: 08/30/2022 10:40:58 AM By: Fonnie Mu RN Entered By: Fonnie Mu on 07/22/2022 08:24:38 [name] ([id number]) [id number] Page 2 of 8 -------------------------------------------------------------------------------- Clinic Level of Care Assessment Details Patient Name: Date of Service: [name], [name]. 07/22/2022 8:00 A Velez Medical Record Number:  [id number] Patient Account Number: [id number] Date of Birth/Sex: Treating RN: [birth year] (36 y.o. Male) Fonnie Mu Primary Care Corazon Nickolas: Ricky Stabs Other Clinician: Referring Jannat Rosemeyer: Treating Niguel Moure/Extender: Bonna Gains, Amy Weeks in Treatment: 0 Clinic Level of Care Assessment Items TOOL 4 Quantity Score X- 1 0 Use when only an EandM is performed on FOLLOW-UP visit ASSESSMENTS - Nursing Assessment / Reassessment X- 1 10 Reassessment of Co-morbidities (includes updates in patient status) X- 1 5 Reassessment of Adherence to Treatment Plan ASSESSMENTS - Wound and Skin A ssessment / Reassessment X - Simple Wound Assessment / Reassessment - one wound 1 5 []  - 0 Complex Wound Assessment / Reassessment - multiple wounds []  - 0 Dermatologic / Skin Assessment (not related to wound area) ASSESSMENTS - Focused Assessment X- 1 5 Circumferential Edema Measurements - multi extremities []  - 0 Nutritional Assessment / Counseling / Intervention []  - 0 Lower Extremity Assessment (monofilament, tuning fork, pulses) []  - 0 Peripheral Arterial Disease Assessment (using hand held doppler) ASSESSMENTS - Ostomy and/or Continence Assessment and Care []  - 0 Incontinence Assessment and Management []  - 0 Ostomy Care Assessment and Management (repouching, etc.) PROCESS - Coordination of Care X - Simple Patient / Family Education for ongoing care 1 15 []  - 0 Complex (extensive) Patient / Family Education for ongoing care X- 1 10 Staff obtains Chiropractor, Records, T Results / Process Orders est []  - 0 Staff telephones HHA, Nursing Homes / Clarify orders / etc []  - 0 Routine Transfer to another Facility (non-emergent condition) []  - 0 Routine Hospital Admission (non-emergent condition) X- 1 15 New Admissions / Manufacturing engineer / Ordering NPWT Apligraf, etc. , []  - 0 Emergency Hospital Admission (emergent condition) X- 1 10 Simple Discharge Coordination []  -  0 Complex (extensive) Discharge Coordination PROCESS - Special Needs []  - 0 Pediatric / Minor Patient Management []  - 0 Isolation Patient Management []  - 0 Hearing / Language / Visual special needs []  - 0 Assessment of Community assistance (transportation, D/C planning, etc.) []  - 0 Additional assistance / Altered mentation []  - 0 Support Surface(s) Assessment (bed, cushion, seat, etc.) INTERVENTIONS - Wound Cleansing / Measurement  X - Simple Wound Cleansing - one wound 1 5 []  - 0 Complex Wound Cleansing - multiple wounds X- 1 5 Wound Imaging (photographs - any number of wounds) [name] ([id number]) [id number] Page 3 of 8 []  - 0 Wound Tracing (instead of photographs) X- 1 5 Simple Wound Measurement - one wound []  - 0 Complex Wound Measurement - multiple wounds INTERVENTIONS - Wound Dressings []  - 0 Small Wound Dressing one or multiple wounds X- 1 15 Medium Wound Dressing one or multiple wounds []  - 0 Large Wound Dressing one or multiple wounds X- 1 5 Application of Medications - topical []  - 0 Application of Medications - injection INTERVENTIONS - Miscellaneous []  - 0 External ear exam []  - 0 Specimen Collection (cultures, biopsies, blood, body fluids, etc.) []  - 0 Specimen(s) / Culture(s) sent or taken to Lab for analysis []  - 0 Patient Transfer (multiple staff / Nurse, adult / Similar devices) []  - 0 Simple Staple / Suture removal (25 or less) []  - 0 Complex Staple / Suture removal (26 or more) []  - 0 Hypo / Hyperglycemic Management (close monitor of Blood Glucose) X- 1 15 Ankle / Brachial Index (ABI) - do not check if billed separately X- 1 5 Vital Signs Has the patient been seen at the hospital within the last three years: Yes Total Score: 130 Level Of Care: New/Established - Level 4 Electronic Signature(s) Signed: 08/30/2022 10:40:58 AM By: Fonnie Mu RN Entered By: Fonnie Mu on 07/22/2022  08:54:03 -------------------------------------------------------------------------------- Encounter Discharge Information Details Patient Name: Date of Service: [name]. 07/22/2022 8:00 A Velez Medical Record Number: [id number] Patient Account Number: [id number] Date of Birth/Sex: Treating RN: 09-28-1985 (36 y.o. Male) Fonnie Mu Primary Care Ayla Dunigan: Ricky Stabs Other Clinician: Referring Chesney Suares: Treating Raihan Kimmel/Extender: Bonna Gains, Amy Weeks in Treatment: 0 Encounter Discharge Information Items Discharge Condition: Stable Ambulatory Status: Ambulatory Discharge Destination: Home Transportation: Private Auto Accompanied By: self Schedule Follow-up Appointment: Yes Clinical Summary of Care: Patient Declined Electronic Signature(s) Signed: 08/30/2022 10:40:58 AM By: Fonnie Mu RN Entered By: Fonnie Mu on 07/22/2022 08:54:38 Valade, [name] ([id number]) [id number] Page 4 of 8 -------------------------------------------------------------------------------- Lower Extremity Assessment Details Patient Name: Date of Service: [name], SHOAFF. 07/22/2022 8:00 A Velez Medical Record Number: [id number] Patient Account Number: [id number] Date of Birth/Sex: Treating RN: [birth year] (36 y.o. Male) Fonnie Mu Primary Care Ladarius Seubert: Ricky Stabs Other Clinician: Referring Lene Mckay: Treating Nykolas Bacallao/Extender: Bonna Gains, Amy Weeks in Treatment: 0 Edema Assessment Assessed: [Left: Yes] [Right: Yes] Edema: [Left: Yes] [Right: No] Calf Left: Right: Point of Measurement: 35 cm From Medial Instep 37 cm Ankle Left: Right: Point of Measurement: 8 cm From Medial Instep 23 cm Knee To Floor Left: Right: From Medial Instep 45 cm Vascular Assessment Pulses: Dorsalis Pedis Palpable: [Right:Yes] Posterior Tibial Palpable: [Right:Yes] Electronic Signature(s) Signed: 08/30/2022 10:40:58 AM By: Fonnie Mu RN Entered By: Fonnie Mu on 07/22/2022 08:24:20 -------------------------------------------------------------------------------- Multi Wound Chart Details Patient Name: Date of Service: [name]. 07/22/2022 8:00 A Velez Medical Record Number: [id number] Patient Account Number: [id number] Date of Birth/Sex: Treating RN: Sep 16, 1985 (37 y.o. Male) Primary Care Manal Kreutzer: Ricky Stabs Other Clinician: Referring Declyn Offield: Treating Adalbert Alberto/Extender: Bonna Gains, Amy Weeks in Treatment: 0 Vital Signs Height(in): Pulse(bpm): 74 Weight(lbs): Blood Pressure(mmHg): 204/135 Body Mass Index(BMI): Temperature(F): 98.1 Respiratory Rate(breaths/min): 17 [1:Photos:] [N/A:N/A] Right T - Web between 1st and 2nd oe N/A N/A Wound Location: Gradually Appeared N/A N/A Wounding Event: Diabetic Wound/Ulcer of the Lower N/A N/A  Primary Etiology: Extremity Hypertension, Type II Diabetes, End N/A N/A Comorbid History: Stage Renal Disease 07/12/2021 N/A N/A Date Acquired: 0 N/A N/A Weeks of Treatment: Open N/A N/A Wound Status: No N/A N/A Wound Recurrence: 2.5x1.5x0.2 N/A N/A Measurements L x W x D (cm) 2.945 N/A N/A A (cm) : rea 0.589 N/A N/A Volume (cm) : Grade 1 N/A N/A Classification: Medium N/A N/A Exudate A mount: Serosanguineous N/A N/A Exudate Type: red, brown N/A N/A Exudate Color: Distinct, outline attached N/A N/A Wound Margin: Large (67-100%) N/A N/A Granulation A mount: Red, Pink N/A N/A Granulation Quality: Small (1-33%) N/A N/A Necrotic A mount: Fascia: No N/A N/A Exposed Structures: Fat Layer (Subcutaneous Tissue): No Tendon: No Muscle: No Joint: No Bone: No Medium (34-66%) N/A N/A Epithelialization: Treatment Notes Wound #1 (Toe - Web between 1st and 2nd) Wound Laterality: Right Cleanser Soap and Water Discharge Instruction: May shower and wash wound with dial antibacterial soap and water prior to dressing change. Wound  Cleanser Discharge Instruction: Cleanse the wound with wound cleanser prior to applying a clean dressing using gauze sponges, not tissue or cotton balls. [employer] Ancillary Kit - 15 Day Supply Discharge Instruction: Use supplies as instructed; Kit contains: (15) Saline Bullets; (15) 3x3 Gauze; 15 pr Gloves Peri-Wound Care Topical Gentamicin Discharge Instruction: As directed by physician antifungal spray Discharge Instruction: apply between all toes Primary Dressing Secondary Dressing Woven Gauze Sponge, Non-Sterile 4x4 in Discharge Instruction: Apply over primary dressing as directed. Secured With Compression Wrap Compression Stockings Add-Ons Electronic Signature(s) Signed: 07/22/2022 9:59:16 AM By: Geralyn Corwin DO Entered By: Geralyn Corwin on 07/22/2022 09:22:15 [name], [name] ([id number]) [id number] Page 6 of 8 -------------------------------------------------------------------------------- Multi-Disciplinary Care Plan Details Patient Name: Date of Service: [name], SEABOLD. 07/22/2022 8:00 A Velez Medical Record Number: [id number] Patient Account Number: [id number] Date of Birth/Sex: Treating RN: 1986/02/03 (36 y.o. Male) Fonnie Mu Primary Care Dakari Cregger: Ricky Stabs Other Clinician: Referring Jehiel Koepp: Treating Ercia Crisafulli/Extender: Bonna Gains, Amy Weeks in Treatment: 0 Active Inactive Electronic Signature(s) Signed: 09/14/2022 9:36:50 AM By: Shawn Stall RN, BSN Signed: 03/24/2023 9:38:11 AM By: Fonnie Mu RN Previous Signature: 08/30/2022 10:40:58 AM Version By: Fonnie Mu RN Entered By: Shawn Stall on 09/14/2022 09:36:50 -------------------------------------------------------------------------------- Pain Assessment Details Patient Name: Date of Service: [name]. 07/22/2022 8:00 A Velez Medical Record Number: [id number] Patient Account Number: [id number] Date of Birth/Sex: Treating RN: 08-17-1985 (36  y.o. Male) Fonnie Mu Primary Care Aryonna Gunnerson: Ricky Stabs Other Clinician: Referring Darleen Moffitt: Treating Wednesday Ericsson/Extender: Bonna Gains, Amy Weeks in Treatment: 0 Active Problems Location of Pain Severity and Description of Pain Patient Has Paino No Site Locations Pain Management and Medication Current Pain Management: Electronic Signature(s) Signed: 08/30/2022 10:40:58 AM By: Fonnie Mu RN Entered By: Fonnie Mu on 07/22/2022 08:13:13 [name], [name] ([id number]) [id number] Page 7 of 8 -------------------------------------------------------------------------------- Patient/Caregiver Education Details Patient Name: Date of Service: [name], [name] 1/11/2024andnbsp8:00 A Velez Medical Record Number: [id number] Patient Account Number: [id number] Date of Birth/Gender: Treating RN: [birth year] (36 y.o. Male) Fonnie Mu Primary Care Physician: Ricky Stabs Other Clinician: Referring Physician: Treating Physician/Extender: Caesar Chestnut Weeks in Treatment: 0 Education Assessment Education Provided To: Patient Education Topics Provided Wound/Skin Impairment: Methods: Explain/Verbal Responses: Reinforcements needed, State content correctly Electronic Signature(s) Signed: 08/30/2022 10:40:58 AM By: Fonnie Mu RN Entered By: Fonnie Mu on 07/22/2022 08:48:52 -------------------------------------------------------------------------------- Wound Assessment Details Patient Name: Date of Service: [name]. 07/22/2022 8:00 A Velez Medical Record Number: [id number] Patient Account Number: [id number] Date  of Birth/Sex: Treating RN: 02/06/1986 (36 y.o. Male) Fonnie Mu Primary Care Jamyson Jirak: Ricky Stabs Other Clinician: Referring Taneil Lazarus: Treating Larnie Heart/Extender: Bonna Gains, Amy Weeks in Treatment: 0 Wound Status Wound Number: 1 Primary Etiology: Diabetic  Wound/Ulcer of the Lower Extremity Wound Location: Right T - Web between 1st and 2nd oe Wound Status: Open Wounding Event: Gradually Appeared Comorbid History: Hypertension, Type II Diabetes, End Stage Renal Disease Date Acquired: 07/12/2021 Weeks Of Treatment: 0 Clustered Wound: No Photos Wound Measurements Length: (cm) 2.5 Width: (cm) 1.5 Edgar, [name] ([id number]) Depth: (cm) 0.2 Area: (cm) 2. Volume: (cm) 0. % Reduction in Area: % Reduction in Volume: [id number] Page 8 of 8 Epithelialization: Medium (34-66%) 945 Tunneling: No 589 Undermining: No Wound Description Classification: Grade 1 Wound Margin: Distinct, outline attached Exudate Amount: Medium Exudate Type: Serosanguineous Exudate Color: red, brown Foul Odor After Cleansing: No Slough/Fibrino Yes Wound Bed Granulation Amount: Large (67-100%) Exposed Structure Granulation Quality: Red, Pink Fascia Exposed: No Necrotic Amount: Small (1-33%) Fat Layer (Subcutaneous Tissue) Exposed: No Necrotic Quality: Adherent Slough Tendon Exposed: No Muscle Exposed: No Joint Exposed: No Bone Exposed: No Periwound Skin Texture Texture Color No Abnormalities Noted: No No Abnormalities Noted: No Moisture No Abnormalities Noted: No Electronic Signature(s) Signed: 08/30/2022 10:40:58 AM By: Fonnie Mu RN Entered By: Fonnie Mu on 07/22/2022 08:25:39 -------------------------------------------------------------------------------- Vitals Details Patient Name: Date of Service: [name]. 07/22/2022 8:00 A Velez Medical Record Number: [id number] Patient Account Number: [id number] Date of Birth/Sex: Treating RN: [birth year] (36 y.o. Male) Fonnie Mu Primary Care Ruslan Mccabe: Ricky Stabs Other Clinician: Referring Brettany Sydney: Treating Crisol Muecke/Extender: Bonna Gains, Amy Weeks in Treatment: 0 Vital Signs Time Taken: 08:11 Temperature (F): 98.1 Pulse (bpm):  74 Respiratory Rate (breaths/min): 17 Blood Pressure (mmHg): 204/135 Reference Range: 80 - 120 mg / dl Electronic Signature(s) Signed: 08/30/2022 10:40:58 AM By: Fonnie Mu RN Entered By: Fonnie Mu on 07/22/2022 08:11:32

## 2022-08-30 NOTE — Progress Notes (Signed)
[place], [name] ([id number]) [id number] Page 1 of 4 Visit Report for 07/22/2022 Abuse Risk Screen Details Patient Name: Date of Service: [name], [name]. 07/22/2022 8:00 A Velez Medical Record Number: [id number] Patient Account Number: [id number] Date of Birth/Sex: Treating RN: June 11, 1986 (37 y.o. Burnadette Pop, Lauren Primary Care Alec Jaros: Durene Fruits Other Clinician: Referring Arienne Gartin: Treating Gara Kincade/Extender: Wilber Oliphant, Amy Weeks in Treatment: 0 Abuse Risk Screen Items Answer ABUSE RISK SCREEN: Has anyone close to you tried to hurt or harm you recentlyo No Do you feel uncomfortable with anyone in your familyo No Has anyone forced you do things that you didnt want to doo No Electronic Signature(s) Signed: 08/30/2022 10:40:58 AM By: Rhae Hammock RN Entered By: Rhae Hammock on 07/22/2022 08:11:49 -------------------------------------------------------------------------------- Activities of Daily Living Details Patient Name: Date of Service: [name]. 07/22/2022 8:00 A Velez Medical Record Number: [id number] Patient Account Number: [id number] Date of Birth/Sex: Treating RN: 1985/10/19 (37 y.o. Burnadette Pop, Lauren Primary Care Cordero Surette: Durene Fruits Other Clinician: Referring Anadalay Macdonell: Treating Shelisa Fern/Extender: Wilber Oliphant, Amy Weeks in Treatment: 0 Activities of Daily Living Items Answer Activities of Daily Living (Please select one for each item) Drive Automobile Not Able T Medications ake Need Assistance Use T elephone Completely Able Care for Appearance Need Assistance Use T oilet Need Assistance Bath / Shower Need Assistance Dress Self Need Assistance Feed Self Need Assistance Walk Need Assistance Get In / Out Bed Need Assistance Housework Need Assistance Prepare Meals Need Assistance Handle Money Need Assistance Shop for Self Need Assistance Electronic Signature(s) Signed:  08/30/2022 10:40:58 AM By: Rhae Hammock RN Entered By: Rhae Hammock on 07/22/2022 08:12:27 [name], [name] ([id number]) [id number] Page 2 of 4 -------------------------------------------------------------------------------- Education Screening Details Patient Name: Date of Service: [name], [name]. 07/22/2022 8:00 A Velez Medical Record Number: [id number] Patient Account Number: [id number] Date of Birth/Sex: Treating RN: July 15, 1985 (37 y.o. Burnadette Pop, Lauren Primary Care Edem Tiegs: Durene Fruits Other Clinician: Referring Soha Thorup: Treating Neilani Duffee/Extender: Wilber Oliphant, Amy Weeks in Treatment: 0 Primary Learner Assessed: Patient Learning Preferences/Education Level/Primary Language Learning Preference: Explanation, Demonstration, Communication Board, Printed Material Highest Education Level: High School Preferred Language: English Cognitive Barrier Language Barrier: No Translator Needed: No Memory Deficit: No Emotional Barrier: No Cultural/Religious Beliefs Affecting Medical Care: No Physical Barrier Impaired Vision: Yes Impaired Hearing: No Decreased Hand dexterity: No Knowledge/Comprehension Knowledge Level: High Comprehension Level: High Ability to understand written instructions: High Ability to understand verbal instructions: High Motivation Anxiety Level: Calm Cooperation: Cooperative Education Importance: Denies Need Interest in Health Problems: Asks Questions Perception: Coherent Willingness to Engage in Self-Management High Activities: Readiness to Engage in Self-Management High Activities: Electronic Signature(s) Signed: 08/30/2022 10:40:58 AM By: Rhae Hammock RN Entered By: Rhae Hammock on 07/22/2022 08:12:52 -------------------------------------------------------------------------------- Fall Risk Assessment Details Patient Name: Date of Service: [name], [name]. 07/22/2022 8:00 A  Velez Medical Record Number: [id number] Patient Account Number: [id number] Date of Birth/Sex: Treating RN: 1986-06-22 (37 y.o. [name] Primary Care Uliana Brinker: Durene Fruits Other Clinician: Referring Mariaguadalupe Fialkowski: Treating Alantra Popoca/Extender: Wilber Oliphant, Amy Weeks in Treatment: 0 Fall Risk Assessment Items Have you had 2 or more falls in the last 12 monthso 0 No [name], [name] ([id number]) [phone] [id number] Page 3 of 4 Have you had any fall that resulted in injury in the last 12 monthso 0 No FALLS RISK SCREEN History of falling - immediate or within 3 months 0 No Secondary diagnosis (Do you have 2 or more medical diagnoseso) 0 No  Ambulatory aid None/bed rest/wheelchair/nurse 0 No Crutches/cane/walker 0 No Furniture 0 No Intravenous therapy Access/Saline/Heparin Lock 0 No Gait/Transferring Normal/ bed rest/ wheelchair 0 No Weak (short steps with or without shuffle, stooped but able to lift head while walking, may seek 0 No support from furniture) Impaired (short steps with shuffle, may have difficulty arising from chair, head down, impaired 0 No balance) Mental Status Oriented to own ability 0 No Electronic Signature(s) Signed: 08/30/2022 10:40:58 AM By: Rhae Hammock RN Entered By: Rhae Hammock on 07/22/2022 08:12:57 -------------------------------------------------------------------------------- Foot Assessment Details Patient Name: Date of Service: [name]. 07/22/2022 8:00 A Velez Medical Record Number: [id number] Patient Account Number: [id number] Date of Birth/Sex: Treating RN: [birth year] (37 y.o. Burnadette Pop, Lauren Primary Care Kye Silverstein: Durene Fruits Other Clinician: Referring Townsend Cudworth: Treating Marquel Pottenger/Extender: Wilber Oliphant, Amy Weeks in Treatment: 0 Foot Assessment Items Site Locations + = Sensation present, - = Sensation absent, C = Callus, U = Ulcer R = Redness, W = Warmth, Velez =  Maceration, PU = Pre-ulcerative lesion F = Fissure, S = Swelling, D = Dryness Assessment Right: Left: Other Deformity: No No Prior Foot Ulcer: Yes No Prior Amputation: Yes No Charcot Joint: No No Ambulatory Status: Ambulatory Without Help GaitJemere, Sancho [name] ([id number]) [phone] [id number] Page 4 of 4 Electronic Signature(s) Signed: 08/30/2022 10:40:58 AM By: Rhae Hammock RN Entered By: Rhae Hammock on 07/22/2022 08:23:20 -------------------------------------------------------------------------------- Nutrition Risk Screening Details Patient Name: Date of Service: [name], [name]. 07/22/2022 8:00 A Velez Medical Record Number: [id number] Patient Account Number: [id number] Date of Birth/Sex: Treating RN: March 17, 1986 (37 y.o. [name] Primary Care Elsie Baynes: Durene Fruits Other Clinician: Referring Auria Mckinlay: Treating Rie Mcneil/Extender: Wilber Oliphant, Amy Weeks in Treatment: 0 Height (in): Weight (lbs): Body Mass Index (BMI): Nutrition Risk Screening Items Score Screening NUTRITION RISK SCREEN: I have an illness or condition that made me change the kind and/or amount of food I eat 0 No I eat fewer than two meals per day 0 No I eat few fruits and vegetables, or milk products 0 No I have three or more drinks of beer, liquor or wine almost every day 0 No I have tooth or mouth problems that make it hard for me to eat 0 No I don't always have enough money to buy the food I need 0 No I eat alone most of the time 0 No I take three or more different prescribed or over-the-counter drugs a day 0 No Without wanting to, I have lost or gained 10 pounds in the last six months 0 No I am not always physically able to shop, cook and/or feed myself 0 No Nutrition Protocols Good Risk Protocol 0 No interventions needed Moderate Risk Protocol High Risk Proctocol Risk Level: Good Risk Score: 0 Electronic Signature(s) Signed:  08/30/2022 10:40:58 AM By: Rhae Hammock RN Entered By: Rhae Hammock on 07/22/2022 08:13:02

## 2022-09-07 ENCOUNTER — Encounter (HOSPITAL_BASED_OUTPATIENT_CLINIC_OR_DEPARTMENT_OTHER): Payer: Medicaid Other | Attending: Internal Medicine | Admitting: Internal Medicine

## 2022-10-04 ENCOUNTER — Observation Stay (HOSPITAL_COMMUNITY): Payer: Medicaid Other

## 2022-10-04 ENCOUNTER — Encounter (HOSPITAL_COMMUNITY): Payer: Self-pay

## 2022-10-04 ENCOUNTER — Emergency Department (HOSPITAL_COMMUNITY): Payer: Medicaid Other

## 2022-10-04 ENCOUNTER — Inpatient Hospital Stay (HOSPITAL_COMMUNITY)
Admission: EM | Admit: 2022-10-04 | Discharge: 2022-10-07 | DRG: 252 | Disposition: A | Discharge status: Home / Self Care | Payer: Medicaid Other | Attending: Family Medicine | Admitting: Family Medicine

## 2022-10-04 DIAGNOSIS — G40909 Epilepsy, unspecified, not intractable, without status epilepticus: Secondary | ICD-10-CM | POA: Diagnosis present

## 2022-10-04 DIAGNOSIS — I160 Hypertensive urgency: Secondary | ICD-10-CM | POA: Diagnosis present

## 2022-10-04 DIAGNOSIS — Z89421 Acquired absence of other right toe(s): Secondary | ICD-10-CM

## 2022-10-04 DIAGNOSIS — E875 Hyperkalemia: Secondary | ICD-10-CM | POA: Diagnosis present

## 2022-10-04 DIAGNOSIS — N2581 Secondary hyperparathyroidism of renal origin: Secondary | ICD-10-CM | POA: Diagnosis not present

## 2022-10-04 DIAGNOSIS — Z833 Family history of diabetes mellitus: Secondary | ICD-10-CM

## 2022-10-04 DIAGNOSIS — Z8249 Family history of ischemic heart disease and other diseases of the circulatory system: Secondary | ICD-10-CM

## 2022-10-04 DIAGNOSIS — N186 End stage renal disease: Secondary | ICD-10-CM

## 2022-10-04 DIAGNOSIS — T82868A Thrombosis of vascular prosthetic devices, implants and grafts, initial encounter: Principal | ICD-10-CM | POA: Diagnosis present

## 2022-10-04 DIAGNOSIS — I422 Other hypertrophic cardiomyopathy: Secondary | ICD-10-CM | POA: Diagnosis not present

## 2022-10-04 DIAGNOSIS — F909 Attention-deficit hyperactivity disorder, unspecified type: Secondary | ICD-10-CM | POA: Diagnosis present

## 2022-10-04 DIAGNOSIS — E7800 Pure hypercholesterolemia, unspecified: Secondary | ICD-10-CM | POA: Diagnosis present

## 2022-10-04 DIAGNOSIS — Z1152 Encounter for screening for COVID-19: Secondary | ICD-10-CM | POA: Diagnosis not present

## 2022-10-04 DIAGNOSIS — Z79899 Other long term (current) drug therapy: Secondary | ICD-10-CM

## 2022-10-04 DIAGNOSIS — I132 Hypertensive heart and chronic kidney disease with heart failure and with stage 5 chronic kidney disease, or end stage renal disease: Secondary | ICD-10-CM | POA: Diagnosis present

## 2022-10-04 DIAGNOSIS — Y832 Surgical operation with anastomosis, bypass or graft as the cause of abnormal reaction of the patient, or of later complication, without mention of misadventure at the time of the procedure: Secondary | ICD-10-CM | POA: Diagnosis present

## 2022-10-04 DIAGNOSIS — T82898A Other specified complication of vascular prosthetic devices, implants and grafts, initial encounter: Secondary | ICD-10-CM | POA: Insufficient documentation

## 2022-10-04 DIAGNOSIS — Z794 Long term (current) use of insulin: Secondary | ICD-10-CM | POA: Diagnosis not present

## 2022-10-04 DIAGNOSIS — I161 Hypertensive emergency: Secondary | ICD-10-CM | POA: Diagnosis present

## 2022-10-04 DIAGNOSIS — E1122 Type 2 diabetes mellitus with diabetic chronic kidney disease: Secondary | ICD-10-CM | POA: Diagnosis not present

## 2022-10-04 DIAGNOSIS — I5032 Chronic diastolic (congestive) heart failure: Secondary | ICD-10-CM | POA: Insufficient documentation

## 2022-10-04 DIAGNOSIS — I5033 Acute on chronic diastolic (congestive) heart failure: Secondary | ICD-10-CM | POA: Diagnosis present

## 2022-10-04 DIAGNOSIS — J810 Acute pulmonary edema: Secondary | ICD-10-CM

## 2022-10-04 DIAGNOSIS — Z823 Family history of stroke: Secondary | ICD-10-CM

## 2022-10-04 DIAGNOSIS — Z992 Dependence on renal dialysis: Secondary | ICD-10-CM

## 2022-10-04 DIAGNOSIS — Z888 Allergy status to other drugs, medicaments and biological substances status: Secondary | ICD-10-CM

## 2022-10-04 DIAGNOSIS — R519 Headache, unspecified: Secondary | ICD-10-CM | POA: Diagnosis not present

## 2022-10-04 DIAGNOSIS — D631 Anemia in chronic kidney disease: Secondary | ICD-10-CM | POA: Diagnosis present

## 2022-10-04 DIAGNOSIS — Z5941 Food insecurity: Secondary | ICD-10-CM

## 2022-10-04 DIAGNOSIS — Z5329 Procedure and treatment not carried out because of patient's decision for other reasons: Secondary | ICD-10-CM | POA: Diagnosis present

## 2022-10-04 HISTORY — PX: IR FLUORO GUIDE CV LINE RIGHT: IMG2283

## 2022-10-04 HISTORY — PX: IR US GUIDE VASC ACCESS RIGHT: IMG2390

## 2022-10-04 LAB — CBC WITH DIFFERENTIAL/PLATELET
Abs Immature Granulocytes: 0.02 10*3/uL (ref 0.00–0.07)
Basophils Absolute: 0 10*3/uL (ref 0.0–0.1)
Basophils Relative: 1 %
Eosinophils Absolute: 0.2 10*3/uL (ref 0.0–0.5)
Eosinophils Relative: 3 %
HCT: 34 % — ABNORMAL LOW (ref 39.0–52.0)
Hemoglobin: 10.6 g/dL — ABNORMAL LOW (ref 13.0–17.0)
Immature Granulocytes: 0 %
Lymphocytes Relative: 10 %
Lymphs Abs: 0.8 10*3/uL (ref 0.7–4.0)
MCH: 25.8 pg — ABNORMAL LOW (ref 26.0–34.0)
MCHC: 31.2 g/dL (ref 30.0–36.0)
MCV: 82.7 fL (ref 80.0–100.0)
Monocytes Absolute: 0.7 10*3/uL (ref 0.1–1.0)
Monocytes Relative: 9 %
Neutro Abs: 6.3 10*3/uL (ref 1.7–7.7)
Neutrophils Relative %: 77 %
Platelets: 215 10*3/uL (ref 150–400)
RBC: 4.11 MIL/uL — ABNORMAL LOW (ref 4.22–5.81)
RDW: 19.4 % — ABNORMAL HIGH (ref 11.5–15.5)
WBC: 8.1 10*3/uL (ref 4.0–10.5)
nRBC: 0 % (ref 0.0–0.2)

## 2022-10-04 LAB — HEPATITIS B SURFACE ANTIGEN: Hepatitis B Surface Ag: NONREACTIVE

## 2022-10-04 LAB — BASIC METABOLIC PANEL
Anion gap: 18 — ABNORMAL HIGH (ref 5–15)
Anion gap: 18 — ABNORMAL HIGH (ref 5–15)
BUN: 89 mg/dL — ABNORMAL HIGH (ref 6–20)
BUN: 94 mg/dL — ABNORMAL HIGH (ref 6–20)
CO2: 21 mmol/L — ABNORMAL LOW (ref 22–32)
CO2: 19 mmol/L — ABNORMAL LOW (ref 22–32)
Calcium: 7.5 mg/dL — ABNORMAL LOW (ref 8.9–10.3)
Calcium: 7.5 mg/dL — ABNORMAL LOW (ref 8.9–10.3)
Chloride: 95 mmol/L — ABNORMAL LOW (ref 98–111)
Chloride: 95 mmol/L — ABNORMAL LOW (ref 98–111)
Creatinine, Ser: 16.21 mg/dL — ABNORMAL HIGH (ref 0.61–1.24)
Creatinine, Ser: 16.95 mg/dL — ABNORMAL HIGH (ref 0.61–1.24)
GFR, Estimated: 4 mL/min — ABNORMAL LOW (ref >60)
GFR, Estimated: 3 mL/min — ABNORMAL LOW (ref >60)
Glucose, Bld: 102 mg/dL — ABNORMAL HIGH (ref 70–99)
Glucose, Bld: 97 mg/dL (ref 70–99)
Potassium: 5.8 mmol/L — ABNORMAL HIGH (ref 3.5–5.1)
Potassium: 5 mmol/L (ref 3.5–5.1)
Sodium: 134 mmol/L — ABNORMAL LOW (ref 135–145)
Sodium: 132 mmol/L — ABNORMAL LOW (ref 135–145)

## 2022-10-04 LAB — SARS CORONAVIRUS 2 BY RT PCR: SARS Coronavirus 2 by RT PCR: NEGATIVE

## 2022-10-04 LAB — I-STAT CHEM 8, ED
BUN: 82 mg/dL — ABNORMAL HIGH (ref 6–20)
Calcium, Ion: 0.88 mmol/L — CL (ref 1.15–1.40)
Chloride: 100 mmol/L (ref 98–111)
Creatinine, Ser: >18 mg/dL — ABNORMAL HIGH (ref 0.61–1.24)
Glucose, Bld: 98 mg/dL (ref 70–99)
HCT: 37 % — ABNORMAL LOW (ref 39.0–52.0)
Hemoglobin: 12.6 g/dL — ABNORMAL LOW (ref 13.0–17.0)
Potassium: 5.9 mmol/L — ABNORMAL HIGH (ref 3.5–5.1)
Sodium: 132 mmol/L — ABNORMAL LOW (ref 135–145)
TCO2: 23 mmol/L (ref 22–32)

## 2022-10-04 LAB — CBG MONITORING, ED: Glucose-Capillary: 174 mg/dL — ABNORMAL HIGH (ref 70–99)

## 2022-10-04 LAB — GLUCOSE, CAPILLARY: Glucose-Capillary: 90 mg/dL (ref 70–99)

## 2022-10-04 MED ORDER — HEPARIN SODIUM (PORCINE) 1000 UNIT/ML IJ SOLN
INTRAMUSCULAR | Status: AC
  Filled 2022-10-04: qty 10

## 2022-10-04 MED ORDER — SODIUM BICARBONATE 8.4 % IV SOLN
INTRAVENOUS | Status: AC
  Filled 2022-10-04: qty 50

## 2022-10-04 MED ORDER — CLONIDINE HCL 0.1 MG PO TABS
0.2000 mg | ORAL_TABLET | Freq: Once | ORAL | Status: AC
  Administered 2022-10-04: 0.2 mg via ORAL
  Filled 2022-10-04: qty 2

## 2022-10-04 MED ORDER — CHLORHEXIDINE GLUCONATE CLOTH 2 % EX PADS
6.0000 | MEDICATED_PAD | Freq: Every day | CUTANEOUS | Status: DC
  Administered 2022-10-05 – 2022-10-06 (×2): 6 via TOPICAL

## 2022-10-04 MED ORDER — CALCIUM GLUCONATE-NACL 1-0.675 GM/50ML-% IV SOLN
1.0000 g | Freq: Once | INTRAVENOUS | Status: AC
  Administered 2022-10-04: 1000 mg via INTRAVENOUS
  Filled 2022-10-04: qty 50

## 2022-10-04 MED ORDER — HYDROMORPHONE HCL 1 MG/ML IJ SOLN
0.5000 mg | INTRAMUSCULAR | Status: DC | PRN
  Administered 2022-10-05: 0.5 mg via INTRAVENOUS
  Filled 2022-10-04: qty 1

## 2022-10-04 MED ORDER — ONDANSETRON HCL 4 MG/2ML IJ SOLN
4.0000 mg | Freq: Once | INTRAMUSCULAR | Status: AC
  Administered 2022-10-04: 4 mg via INTRAVENOUS
  Filled 2022-10-04: qty 2

## 2022-10-04 MED ORDER — DEXTROSE 50 % IV SOLN
1.0000 | Freq: Once | INTRAVENOUS | Status: AC
  Administered 2022-10-04: 50 mL via INTRAVENOUS
  Filled 2022-10-04: qty 50

## 2022-10-04 MED ORDER — SEVELAMER CARBONATE 800 MG PO TABS
1600.0000 mg | ORAL_TABLET | Freq: Three times a day (TID) | ORAL | Status: DC
  Administered 2022-10-05 – 2022-10-06 (×4): 1600 mg via ORAL
  Filled 2022-10-04 (×4): qty 2

## 2022-10-04 MED ORDER — CLONIDINE HCL 0.1 MG PO TABS: 0.1000 mg | ORAL_TABLET | Freq: Once | ORAL | Status: DC

## 2022-10-04 MED ORDER — INSULIN ASPART 100 UNIT/ML IV SOLN
5.0000 [IU] | Freq: Once | INTRAVENOUS | Status: AC
  Administered 2022-10-04: 5 [IU] via INTRAVENOUS
  Filled 2022-10-04: qty 0.05

## 2022-10-04 MED ORDER — LIDOCAINE HCL 1 % IJ SOLN
INTRAMUSCULAR | Status: AC
  Filled 2022-10-04: qty 20

## 2022-10-04 MED ORDER — HEPARIN SODIUM (PORCINE) 1000 UNIT/ML DIALYSIS
1000.0000 [IU] | INTRAMUSCULAR | Status: DC | PRN
  Administered 2022-10-05: 2600 [IU] via INTRAVENOUS_CENTRAL
  Filled 2022-10-04: qty 1

## 2022-10-04 MED ORDER — SODIUM ZIRCONIUM CYCLOSILICATE 10 G PO PACK
10.0000 g | PACK | Freq: Every day | ORAL | Status: DC
  Administered 2022-10-04: 10 g via ORAL
  Filled 2022-10-04: qty 1

## 2022-10-04 MED ORDER — CALCITRIOL 0.5 MCG PO CAPS: 1.0000 ug | ORAL_CAPSULE | ORAL | Status: DC

## 2022-10-04 MED ORDER — SODIUM BICARBONATE 8.4 % IV SOLN
50.0000 meq | Freq: Once | INTRAVENOUS | Status: AC
  Administered 2022-10-04: 50 meq via INTRAVENOUS
  Filled 2022-10-04: qty 50

## 2022-10-04 MED ORDER — CALCIUM GLUCONATE 10 % IV SOLN
1.0000 g | Freq: Once | INTRAVENOUS | Status: DC
  Filled 2022-10-04: qty 10

## 2022-10-04 MED ORDER — FUROSEMIDE 10 MG/ML IJ SOLN
100.0000 mg | Freq: Two times a day (BID) | INTRAVENOUS | Status: DC
  Administered 2022-10-04: 100 mg via INTRAVENOUS
  Filled 2022-10-04 (×4): qty 10

## 2022-10-04 MED ORDER — LABETALOL HCL 5 MG/ML IV SOLN
20.0000 mg | INTRAVENOUS | Status: DC | PRN
  Administered 2022-10-05: 20 mg via INTRAVENOUS
  Filled 2022-10-04: qty 4

## 2022-10-04 MED ORDER — ONDANSETRON HCL 4 MG/2ML IJ SOLN
4.0000 mg | Freq: Four times a day (QID) | INTRAMUSCULAR | Status: DC | PRN
  Administered 2022-10-05: 4 mg via INTRAVENOUS
  Filled 2022-10-04: qty 2

## 2022-10-04 NOTE — ED Notes (Signed)
ED TO INPATIENT HANDOFF REPORT  ED Nurse Name and Phone #: [phone]  S Name/Age/Gender [name] 37 y.o. male Room/Bed: 037C/037C  Code Status   Code Status: Full Code  Home/SNF/Other Home Patient oriented to: self, place, time, and situation Is this baseline? Yes   Triage Complete: Triage complete  Chief Complaint AV fistula occlusion [facility]) [T82.898A]  Triage Note Pt reports that he is a 73, Th, Sa dialysis with last tx on Thursday. Pt reports that he has been feeling nauseas and generally unwell since then. Pt states that when he last had dialysis he developed a "hard spot" where it was accessed and was told by dialysis personnel that he was unable to have more treatments until he was "cleaned out". Heart rate palpated at area, but no thrill felt or bruit heard.    Allergies Allergies  Allergen Reactions   Amlodipine Nausea And Vomiting and Other (See Comments)    Patient was taking Amlodipine and Hydralazine at the same time, so the reactions came from one of the 2: Lethargy and an all-over feeling of NOT feeling well (also)   Hydralazine Nausea And Vomiting and Other (See Comments)    Patient was taking Hydralazine AND Amlodipine at the same time, so the reactions came from one of the 2: Lethargy and an all-over feeling of NOT feeling well (also)    Level of Care/Admitting Diagnosis ED Disposition     ED Disposition  Admit   Condition  --   Leslie: [place] [[id number]]  Level of Care: Telemetry Medical [[id number]]  May place patient in observation at [facility] or [facility] if equivalent level of care is available:: No  Covid Evaluation: Asymptomatic - no recent exposure (last 10 days) testing not required  Diagnosis: AV fistula occlusion [facility]) [id number]  Admitting Physician: Lequita Halt [id number]  Attending Physician: Lequita Halt [id number]          B Medical/Surgery History Past Medical History:  Diagnosis Date   ADHD  (attention deficit hyperactivity disorder)    Diabetes mellitus    ESRD on hemodialysis ([place])    [facility] [place] [facility]   High cholesterol    Hypertension    Hypertrophic cardiomyopathy ([place]) 02/26/2021   Seizure ([place]) 02/08/2021   Past Surgical History:  Procedure Laterality Date   AMPUTATION TOE Right 12/29/2021   Procedure: AMPUTATION TOE, fifth;  Surgeon: Lorenda Peck, DPM;  Location: [place];  Service: Podiatry;  Laterality: Right;  surgical team will do local block   AV FISTULA PLACEMENT Left 08/11/2020   Procedure: LEFT UPPER EXTREMITY ARTERIOVENOUS (AV) FISTULA CREATION;  Surgeon: Cherre Robins, MD;  Location: [place];  Service: Vascular;  Laterality: Left;   FISTULA SUPERFICIALIZATION Left [id number]   Procedure: PLICATION OF LEFT RADIUS CEPHALIC FISTULA;  Surgeon: Cherre Robins, MD;  Location: [place];  Service: Vascular;  Laterality: Left;  PERIPHERAL NERVE BLOCK   FRACTURE SURGERY     I & D EXTREMITY Left 07/16/2014   Procedure: IRRIGATION AND DEBRIDEMENT EXTREMITY/LEFT INDEX FINGER;  Surgeon: Leanora Cover, MD;  Location: [place];  Service: Orthopedics;  Laterality: Left;   IR PERC TUN PERIT CATH WO PORT S&I /IMAG  08/07/2020   IR US GUIDE VASC ACCESS RIGHT  08/07/2020     A IV Location/Drains/Wounds Patient Lines/Drains/Airways Status     Active Line/Drains/Airways     Name Placement date Placement time Site Days   Peripheral IV 10/04/22 18 G Anterior;Right Forearm  10/04/22  1024  Forearm  less than 1   Fistula / Graft Left Forearm Arteriovenous fistula 08/11/20  0839  Forearm  784   Fistula / Graft Left Upper arm --  --  Upper arm  --   Fistula / Graft Left Forearm --  --  Forearm  --   Wound / Incision (Open or Dehisced) 05/08/20 Non-pressure wound Foot Left;Anterior 05/08/20  --  Foot  879   Wound / Incision (Open or Dehisced) 05/08/20 Non-pressure wound Foot Right 05/08/20  --  Foot  879   Wound / Incision (Open or Dehisced) 05/08/20 Non-pressure wound Pretibial  Distal;Left Open or Dehisced 05/08/20  1847  Pretibial  879   Wound / Incision (Open or Dehisced) 11/25/20 Non-pressure wound Toe (Comment  which one) Anterior;Right;Posterior 11/25/20  1935  Toe (Comment  which one)  678   Wound / Incision (Open or Dehisced) 02/11/21 Non-pressure wound Toe (Comment  which one) Anterior;Right Bleeding 02/11/21  0800  Toe (Comment  which one)  600   Wound / Incision (Open or Dehisced) 02/11/21 Non-pressure wound Toe (Comment  which one) Anterior;Left bleeding, open 02/11/21  0800  Toe (Comment  which one)  600   Wound / Incision (Open or Dehisced) 10/30/21 Diabetic ulcer Toe (Comment  which one) Right 10/30/21  2200  Toe (Comment  which one)  339            Intake/Output Last 24 hours No intake or output data in the 24 hours ending 10/04/22 1502  Labs/Imaging Results for orders placed or performed during the hospital encounter of 10/04/22 (from the past 48 hour(s))  CBC with Differential     Status: Abnormal   Collection Time: 10/04/22 10:15 AM  Result Value Ref Range   WBC 8.1 4.0 - 10.5 K/uL   RBC 4.11 (L) 4.22 - 5.81 MIL/uL   Hemoglobin 10.6 (L) 13.0 - 17.0 g/dL   HCT 34.0 (L) 39.0 - 52.0 %   MCV 82.7 80.0 - 100.0 fL   MCH 25.8 (L) 26.0 - 34.0 pg   MCHC 31.2 30.0 - 36.0 g/dL   RDW 19.4 (H) 11.5 - 15.5 %   Platelets 215 150 - 400 K/uL   nRBC 0.0 0.0 - 0.2 %   Neutrophils Relative % 77 %   Neutro Abs 6.3 1.7 - 7.7 K/uL   Lymphocytes Relative 10 %   Lymphs Abs 0.8 0.7 - 4.0 K/uL   Monocytes Relative 9 %   Monocytes Absolute 0.7 0.1 - 1.0 K/uL   Eosinophils Relative 3 %   Eosinophils Absolute 0.2 0.0 - 0.5 K/uL   Basophils Relative 1 %   Basophils Absolute 0.0 0.0 - 0.1 K/uL   Immature Granulocytes 0 %   Abs Immature Granulocytes 0.02 0.00 - 0.07 K/uL    Comment: Performed at [facility], [place] [address] [place], [place] [address]  I-Stat Chem 8, ED     Status: Abnormal   Collection Time: 10/04/22 10:25 AM  Result Value  Ref Range   Sodium 132 (L) 135 - 145 mmol/L   Potassium 5.9 (H) 3.5 - 5.1 mmol/L   Chloride 100 98 - 111 mmol/L   BUN 82 (H) 6 - 20 mg/dL   Creatinine, Ser >18.00 (H) 0.61 - 1.24 mg/dL   Glucose, Bld 98 70 - 99 mg/dL    Comment: Glucose reference range applies only to samples taken after fasting for at least 8 hours.   Calcium, Ion 0.88 (LL) 1.15 -  1.40 mmol/L   TCO2 23 22 - 32 mmol/L   Hemoglobin 12.6 (L) 13.0 - 17.0 g/dL   HCT 37.0 (L) 39.0 - 52.0 %   Comment NOTIFIED PHYSICIAN   Basic metabolic panel     Status: Abnormal   Collection Time: 10/04/22 10:50 AM  Result Value Ref Range   Sodium 134 (L) 135 - 145 mmol/L   Potassium 5.8 (H) 3.5 - 5.1 mmol/L   Chloride 95 (L) 98 - 111 mmol/L   CO2 21 (L) 22 - 32 mmol/L   Glucose, Bld 102 (H) 70 - 99 mg/dL    Comment: Glucose reference range applies only to samples taken after fasting for at least 8 hours.   BUN 89 (H) 6 - 20 mg/dL   Creatinine, Ser 16.21 (H) 0.61 - 1.24 mg/dL   Calcium 7.5 (L) 8.9 - 10.3 mg/dL   GFR, Estimated 4 (L) >60 mL/min    Comment: (NOTE) Calculated using the CKD-EPI Creatinine Equation (2021)    Anion gap 18 (H) 5 - 15    Comment: Performed at [facility], [place] [address], [place], [place] [address]  SARS Coronavirus 2 by RT PCR (hospital order, performed in [facility] hospital lab) *cepheid single result test* Anterior Nasal Swab     Status: None   Collection Time: 10/04/22 11:30 AM   Specimen: Anterior Nasal Swab  Result Value Ref Range   SARS Coronavirus 2 by RT PCR NEGATIVE NEGATIVE    Comment: (NOTE) SARS-CoV-2 target nucleic acids are NOT DETECTED.  The SARS-CoV-2 RNA is generally detectable in upper and lower respiratory specimens during the acute phase of infection. The lowest concentration of SARS-CoV-2 viral copies this assay can detect is 250 copies / mL. A negative result does not preclude SARS-CoV-2 infection and should not be used as the sole basis for treatment or  other patient management decisions.  A negative result may occur with improper specimen collection / handling, submission of specimen other than nasopharyngeal swab, presence of viral mutation(s) within the areas targeted by this assay, and inadequate number of viral copies (<250 copies / mL). A negative result must be combined with clinical observations, patient history, and epidemiological information.  Fact Sheet for Patients:   https://www.patel.info/  Fact Sheet for Healthcare Providers: https://hall.com/  This test is not yet approved or  cleared by the [place] FDA and has been authorized for detection and/or diagnosis of SARS-CoV-2 by FDA under an Emergency Use Authorization (EUA).  This EUA will remain in effect (meaning this test can be used) for the duration of the COVID-19 declaration under Section 564(b)(1) of the Act, 21 U.S.C. section 360bbb-3(b)(1), unless the authorization is terminated or revoked sooner.  Performed at [facility], [place] [address], [place], [place] [address]   CBG monitoring, ED     Status: Abnormal   Collection Time: 10/04/22 11:45 AM  Result Value Ref Range   Glucose-Capillary 174 (H) 70 - 99 mg/dL    Comment: Glucose reference range applies only to samples taken after fasting for at least 8 hours.   DG Chest 2 View  Result Date: 10/04/2022 CLINICAL DATA:  Shortness of breath, not able to complete dialysis due to fistula EXAM: CHEST - 2 VIEW COMPARISON:  08/13/2022 FINDINGS: Cardiomegaly. Diffuse bilateral interstitial opacity. The visualized skeletal structures are unremarkable. IMPRESSION: Cardiomegaly with diffuse bilateral interstitial opacity, likely edema. No focal airspace opacity. Electronically Signed   By: Delanna Ahmadi M.D.   On: 10/04/2022 10:39  Pending Labs Unresulted Labs (From admission, onward)     Start     Ordered   10/05/22 [id number]  Basic metabolic panel   Tomorrow morning,   R        10/04/22 1417   10/04/22 [id number]  Basic metabolic panel  Once-Timed,   STAT        10/04/22 1232   10/04/22 1229  Hepatitis B surface antigen  (New Admission Hemo Labs (Hepatitis B))  Add-on,   AD        10/04/22 1229   10/04/22 1229  Hepatitis B surface antibody,quantitative  (New Admission Hemo Labs (Hepatitis B))  Add-on,   AD        10/04/22 1229   Signed and Held  HIV Antibody (routine testing w rflx)  (HIV Antibody (Routine testing w reflex) panel)  Once,   R        Signed and Held   Signed and Held  Basic metabolic panel  Daily,   R     Comments: As Scheduled for 5 days    Signed and Held   Signed and Held  Basic metabolic panel  Once-Timed,   R        Signed and Held            Vitals/Pain Today's Vitals   10/04/22 1236 10/04/22 1300 10/04/22 1315 10/04/22 1326  BP: (!) 181/114 (!) 151/93    Pulse: 82 79 80 79  Resp: 19 14 11 16   Temp: 98.6 F (37 C)     TempSrc: Oral     SpO2: 98%  99% 100%  Weight:      Height:      PainSc:        Isolation Precautions Airborne and Contact precautions  Medications Medications  sodium zirconium cyclosilicate (LOKELMA) packet 10 g (10 g Oral Given 10/04/22 1114)  sevelamer carbonate (RENVELA) tablet 1,600 mg (has no administration in time range)  calcitRIOL (ROCALTROL) capsule 1 mcg (has no administration in time range)  Chlorhexidine Gluconate Cloth 2 % PADS 6 each (has no administration in time range)  furosemide (LASIX) 100 mg in dextrose 5 % 50 mL IVPB (100 mg Intravenous New Bag/Given 10/04/22 1407)  HYDROmorphone (DILAUDID) injection 0.5 mg (has no administration in time range)  ondansetron (ZOFRAN) injection 4 mg (has no administration in time range)  labetalol (NORMODYNE) injection 20 mg (has no administration in time range)  ondansetron (ZOFRAN) injection 4 mg (4 mg Intravenous Given 10/04/22 1118)  insulin aspart (novoLOG) injection 5 Units (5 Units Intravenous Given 10/04/22 1135)    And   dextrose 50 % solution 50 mL (50 mLs Intravenous Given 10/04/22 1120)  sodium bicarbonate injection 50 mEq (50 mEq Intravenous Given 10/04/22 1122)  cloNIDine (CATAPRES) tablet 0.2 mg (0.2 mg Oral Given 10/04/22 1116)  calcium gluconate 1 g/ 50 mL sodium chloride IVPB (0 mg Intravenous Stopped 10/04/22 1448)    Mobility walks     Focused Assessments    R Recommendations: See Admitting Provider Note  Report given to:   Additional Notes:

## 2022-10-04 NOTE — ED Triage Notes (Signed)
Pt reports that he is a 12, Th, Sa dialysis with last tx on Thursday. Pt reports that he has been feeling nauseas and generally unwell since then. Pt states that when he last had dialysis he developed a "hard spot" where it was accessed and was told by dialysis personnel that he was unable to have more treatments until he was "cleaned out". Heart rate palpated at area, but no thrill felt or bruit heard.

## 2022-10-04 NOTE — ED Provider Triage Note (Signed)
Emergency Medicine Provider Triage Evaluation Note  [name] , a 37 y.o. male  was evaluated in triage.  Pt complains of feeling bad. Pt's last dialysis was Thursday. Went on Saturday and report "the nurse messed up my fistula" and now they can't access it.  He report feelling weak, sob, nausea, headache and discomfort to L arm  Review of Systems  Positive: As above Negative: As above  Physical Exam  BP (!) 201/131 (BP Location: Left Arm)   Pulse 78   Temp 99.4 F (37.4 C) (Oral)   Resp 16   SpO2 95%  Gen:   Awake, no distress   Resp:  Normal effort  MSK:   Moves extremities without difficulty  Other:  L forearm AV fistula with lack of palpable bruits/thrills  Medical Decision Making  Medically screening exam initiated at 9:32 AM.  Appropriate orders placed.  DAXSTON ROBTOY was informed that the remainder of the evaluation will be completed by another provider, this initial triage assessment does not replace that evaluation, and the importance of remaining in the ED until their evaluation is complete.     Domenic Moras, PA-C 10/04/22 [phone]

## 2022-10-04 NOTE — Consult Note (Signed)
Chief Complaint: Patient was seen in consultation today for clotted LUE AVF Chief Complaint  Patient presents with   Nausea   Shortness of Breath   at the request of Santiago Bumpers   Referring Physician(s): Santiago Bumpers   Supervising Physician: Aletta Edouard  Patient Status: [facility] - ED  History of Present Illness: [name] is a 37 y.o. male with PMHs of HTN, hypertropic cardiomyopathy, DM, ESRD on RRT via LUE AVF who presents with malfunctioning, possibly clotted AVF.   Patient is known to IR service for tunneled HA cath placement on 08/07/20  Patient went to HD on Saturday and was told that his access was clotted, he did not received full HD session on Saturday. Today patient presented to ED due to  worsening shortness of breath and nausea. Vitals notable for hypertension, BUN 89, creatinine 16.21, potassium 5.8. CXR showed bilateral pulmonary edema. Nephrology was consulted and IR was requested for AVF fistulogram with possible interventions. Case reviewed with dr. Kathlene Cote, will place temporary HA cath today for urgent dialysis and schedule the patient for fistulogram with possible intervention tomorrow.   Of note, patient has left radiocephalic AVF which was created on [id number], he underwent plication of the fistula on 12/02/21.   Patient laying in bed, not in acute distress.  Reports SOB and nausea this morning, feeling better since he has been treated/receive medicine in ED. Reports mild pain in his left arm, denies swelling.  Denise headache, fever, chills, cough, chest pain, abdominal pain, vomiting, and bleeding.   Past Medical History:  Diagnosis Date   ADHD (attention deficit hyperactivity disorder)    Diabetes mellitus    ESRD on hemodialysis ([place])    [facility] [place] [facility]   High cholesterol    Hypertension    Hypertrophic cardiomyopathy ([place]) 02/26/2021   Seizure ([place]) 02/08/2021    Past Surgical History:  Procedure Laterality Date   AMPUTATION  TOE Right 12/29/2021   Procedure: AMPUTATION TOE, fifth;  Surgeon: Lorenda Peck, DPM;  Location: [place];  Service: Podiatry;  Laterality: Right;  surgical team will do local block   AV FISTULA PLACEMENT Left 08/11/2020   Procedure: LEFT UPPER EXTREMITY ARTERIOVENOUS (AV) FISTULA CREATION;  Surgeon: Cherre Robins, MD;  Location: [place];  Service: Vascular;  Laterality: Left;   FISTULA SUPERFICIALIZATION Left [id number]   Procedure: PLICATION OF LEFT RADIUS CEPHALIC FISTULA;  Surgeon: Cherre Robins, MD;  Location: [place];  Service: Vascular;  Laterality: Left;  PERIPHERAL NERVE BLOCK   FRACTURE SURGERY     I & D EXTREMITY Left 07/16/2014   Procedure: IRRIGATION AND DEBRIDEMENT EXTREMITY/LEFT INDEX FINGER;  Surgeon: Leanora Cover, MD;  Location: [place];  Service: Orthopedics;  Laterality: Left;   IR PERC TUN PERIT CATH WO PORT S&I /IMAG  08/07/2020   IR US GUIDE VASC ACCESS RIGHT  08/07/2020    Allergies: Amlodipine and Hydralazine  Medications: Prior to Admission medications   Medication Sig Start Date End Date Taking? Authorizing Provider  acetaminophen (TYLENOL) 325 MG tablet Take 2 tablets (650 mg total) by mouth every 6 (six) hours as needed for mild pain (or Fever >/= 101). 12/30/21   Nita Sells, MD  atorvastatin (LIPITOR) 40 MG tablet Take 40 mg by mouth daily.    [provider]  B Complex-C-Folic Acid (RENA-VITE RX) 1 MG TABS Take 1 tablet by mouth daily. 06/28/22   [provider]  calcitRIOL (ROCALTROL) 0.5 MCG capsule Take 2 capsules (1 mcg total) by mouth Every  Tuesday,Thursday,and Saturday with dialysis. 12/31/21   Nita Sells, MD  carvedilol (COREG) 25 MG tablet Take 1 tablet (25 mg total) by mouth 2 (two) times daily with a meal. 03/05/21   Samella Parr, NP  cloNIDine (CATAPRES) 0.1 MG tablet Take 0.1 mg by mouth 2 (two) times daily. 04/20/22   [provider]  insulin glargine-yfgn (SEMGLEE) 100 UNIT/ML injection Inject 0.12 mLs (12  Units total) into the skin at bedtime. 07/09/22   Camillia Herter, NP  insulin lispro (HUMALOG) 100 UNIT/ML injection Inject 1-7 Units into the skin See admin instructions. Per sliding scale 3 times daily with meals 151-200= 1 unit 201-250= 2 units 251-300= 3 units 301-350= 5 units 351-400= 7 units Greater than 400 call md takes only as needed if blood sugar is over 200    [provider]  Insulin Pen Needle (PEN NEEDLES) 31G X 8 MM MISC UAD 07/09/22   Camillia Herter, NP  Multiple Vitamins-Minerals (MULTIVITAMIN WITH MINERALS) tablet Take 1 tablet by mouth daily.    [provider]  multivitamin (RENA-VIT) TABS tablet Take 1 tablet by mouth at bedtime. Patient not taking: Reported on 10/30/2021 11/27/20   Lavina Hamman, MD  ondansetron (ZOFRAN) 4 MG tablet Take 4 mg by mouth every 8 (eight) hours as needed for nausea or vomiting. 04/12/21   [provider]  prednisoLONE acetate (PRED FORTE) 1 % ophthalmic suspension Place 1 drop into the left eye See admin instructions. 2-3 times daily 10/15/21   [provider]  sevelamer carbonate (RENVELA) 800 MG tablet Take 2 tablets (1,600 mg total) by mouth 3 (three) times daily with meals. Patient not taking: Reported on 12/29/2021 03/05/21   Samella Parr, NP     Family History  Problem Relation Age of Onset   Cancer Mother    Stroke Father    Diabetes Father    Hypertension Father     Social History   Socioeconomic History   Marital status: Single    Spouse name: Not on file   Number of children: Not on file   Years of education: Not on file   Highest education level: Not on file  Occupational History   Occupation: disabled  Tobacco Use   Smoking status: Never    Passive exposure: Never   Smokeless tobacco: Never  Vaping Use   Vaping Use: Never used  Substance and Sexual Activity   Alcohol use: Not Currently    Comment: social drinker   Drug use: No   Sexual activity: Yes  Other Topics Concern    Not on file  Social History Narrative   Not on file   Social Determinants of Health   Financial Resource Strain: Not on file  Food Insecurity: Not on file  Transportation Needs: Not on file  Physical Activity: Not on file  Stress: Not on file  Social Connections: Not on file     Review of Systems: A 12 point ROS discussed and pertinent positives are indicated in the HPI above.  All other systems are negative.  Vital Signs: BP (!) 151/93   Pulse 79   Temp 98.6 F (37 C) (Oral)   Resp 16   Ht 6' (1.829 m)   Wt 195 lb 1.7 oz (88.5 kg)   SpO2 100%   BMI 26.46 kg/m    Physical Exam Vitals reviewed.  Constitutional:      General: He is not in acute distress.    Appearance: He is not  ill-appearing.  HENT:     Head: Normocephalic.  Cardiovascular:     Rate and Rhythm: Normal rate and regular rhythm.  Pulmonary:     Effort: Pulmonary effort is normal.  Musculoskeletal:     Comments: + left forearm AVF Round, firm mass around the radial head  + bruit and thrill   Skin:    General: Skin is warm and dry.     Coloration: Skin is not cyanotic or pale.  Neurological:     Mental Status: He is alert and oriented to person, place, and time.  Psychiatric:        Mood and Affect: Mood normal.        Behavior: Behavior normal.     MD Evaluation Airway: WNL Heart: WNL Abdomen: WNL Chest/ Lungs: WNL ASA  Classification: 3 Mallampati/Airway Score: Two  Imaging: DG Chest 2 View  Result Date: 10/04/2022 CLINICAL DATA:  Shortness of breath, not able to complete dialysis due to fistula EXAM: CHEST - 2 VIEW COMPARISON:  08/13/2022 FINDINGS: Cardiomegaly. Diffuse bilateral interstitial opacity. The visualized skeletal structures are unremarkable. IMPRESSION: Cardiomegaly with diffuse bilateral interstitial opacity, likely edema. No focal airspace opacity. Electronically Signed   By: Delanna Ahmadi M.D.   On: 10/04/2022 10:39    Labs:  CBC: Recent Labs    12/28/21 1740  12/29/21 1749 12/30/21 0808 08/13/22 1105 10/04/22 1015 10/04/22 1025  WBC 9.2  --  8.6 7.6 8.1  --   HGB 11.1*   < > 10.0* 11.5* 10.6* 12.6*  HCT 34.8*   < > 30.2* 36.3* 34.0* 37.0*  PLT 323  --  316 314 215  --    < > = values in this interval not displayed.    COAGS: No results for input(s): "INR", "APTT" in the last 8760 hours.  BMP: Recent Labs    12/28/21 1740 12/29/21 1749 12/30/21 0808 08/13/22 1105 10/04/22 1025 10/04/22 1050  NA 139   < > 135 137 132* 134*  K 4.8   < > 3.7 5.1 5.9* 5.8*  CL 101   < > 97* 96* 100 95*  CO2 17*  --  25 23  --  21*  GLUCOSE 216*   < > 141* 108* 98 102*  BUN 77*   < > 48* 72* 82* 89*  CALCIUM 8.9  --  8.2* 7.3*  --  7.5*  CREATININE 15.14*   < > 9.85* 15.15* >18.00* 16.21*  GFRNONAA 4*  --  6* 4*  --  4*   < > = values in this interval not displayed.    LIVER FUNCTION TESTS: Recent Labs    10/30/21 1113 10/31/21 0524 12/28/21 1740 08/13/22 1105  BILITOT 1.0 0.9 0.6 0.5  AST 13* 12* 10* 12*  ALT 24 22 16 13   ALKPHOS 71 66 62 63  PROT 8.0 7.2 9.1* 7.8  ALBUMIN 3.8 3.2* 3.7 3.8    TUMOR MARKERS: No results for input(s): "AFPTM", "CEA", "CA199", "CHROMGRNA" in the last 8760 hours.  Assessment and Plan: 37 y.o. male with ESRD on RRT via left arm AVF who presents with malfunctioning AVF.   VS HTN 151/93; O2 100% in RA WBC wnl; hgb 10.6, plt 215  BUN 89, creatinine 16.21, potassium 5.8  Allergies reviewed  Risks and benefits of the dialysis circuit study were discussed with the patient including, but not limited to bleeding, infection, vascular injury, and inability to improve the function of the fistula/graft which would require placement of a tunneled  dialysis catheter.  All of the patient's questions were answered, patient is agreeable to proceed. Consent signed and in IR.  Patient is scheduled for temp cath placement today, and fistulogram with possible intervention tomorrow pending IR schedule.   PLAN - NPO  at [place]  - BMP in AM    Thank you for this interesting consult.  I greatly enjoyed meeting DONNOVAN DELASHMIT and look forward to participating in their care.  A copy of this report was sent to the requesting provider on this date.  Electronically Signed: Tera Mater, PA-C 10/04/2022, 1:47 PM   I spent a total of  30 Minutes   in face to face in clinical consultation, greater than 50% of which was counseling/coordinating care for temp cath placement and fistulogram with possible intervention.   This chart was dictated using voice recognition software.  Despite best efforts to proofread,  errors can occur which can change the documentation meaning.

## 2022-10-04 NOTE — ED Provider Notes (Signed)
Care of patient received from prior provider at 12:33 PM, please see their note for complete H/P and care plan.    Reassessment: Handoff received via phone call from Dr. Kathrynn Humble. Dr. Osborne Casco is already seen the patient.  Alerted him that he had been successfully transferred.  Interventional radiology was consulted per nephrology.  Consulted hospitalist who came down to bedside to evaluate patient and agree with need for admission.  Patient arranged for admission with no further acute events.     Tretha Sciara, MD 10/04/22 1317

## 2022-10-04 NOTE — ED Provider Notes (Signed)
[place] EMERGENCY DEPARTMENT AT [facility] Provider Note   CSN: [id number] Arrival date & time: 10/04/22  [id number]     History  Chief Complaint  Patient presents with   Nausea   Shortness of Breath    [name] is a 37 y.o. male.  HPI    37 year old male comes in with chief complaint of nausea and shortness of breath. Patient has history of ESRD on hemodialysis TTS.  Patient states that he went for his dialysis on Saturday, but was told that his fistula was clotted.  He was not given any proper follow-up and today he started having nausea, shortness of breath, prompting him to come to the emergency room.  Patient noted to have elevated blood pressure.  He states that he has taken his hypertension medications already.  Review of system negative for chest pain.  Patient is having mild headache.  Home Medications Prior to Admission medications   Medication Sig Start Date End Date Taking? Authorizing Provider  acetaminophen (TYLENOL) 325 MG tablet Take 2 tablets (650 mg total) by mouth every 6 (six) hours as needed for mild pain (or Fever >/= 101). 12/30/21   Nita Sells, MD  atorvastatin (LIPITOR) 40 MG tablet Take 40 mg by mouth daily.    [provider]  B Complex-C-Folic Acid (RENA-VITE RX) 1 MG TABS Take 1 tablet by mouth daily. 06/28/22   [provider]  calcitRIOL (ROCALTROL) 0.5 MCG capsule Take 2 capsules (1 mcg total) by mouth Every Tuesday,Thursday,and Saturday with dialysis. 12/31/21   Nita Sells, MD  carvedilol (COREG) 25 MG tablet Take 1 tablet (25 mg total) by mouth 2 (two) times daily with a meal. 03/05/21   Samella Parr, NP  cloNIDine (CATAPRES) 0.1 MG tablet Take 0.1 mg by mouth 2 (two) times daily. 04/20/22   [provider]  insulin glargine-yfgn (SEMGLEE) 100 UNIT/ML injection Inject 0.12 mLs (12 Units total) into the skin at bedtime. 07/09/22   Camillia Herter, NP  insulin lispro (HUMALOG) 100 UNIT/ML  injection Inject 1-7 Units into the skin See admin instructions. Per sliding scale 3 times daily with meals 151-200= 1 unit 201-250= 2 units 251-300= 3 units 301-350= 5 units 351-400= 7 units Greater than 400 call md takes only as needed if blood sugar is over 200    [provider]  Insulin Pen Needle (PEN NEEDLES) 31G X 8 MM MISC UAD 07/09/22   Camillia Herter, NP  Multiple Vitamins-Minerals (MULTIVITAMIN WITH MINERALS) tablet Take 1 tablet by mouth daily.    [provider]  multivitamin (RENA-VIT) TABS tablet Take 1 tablet by mouth at bedtime. Patient not taking: Reported on 10/30/2021 11/27/20   Lavina Hamman, MD  ondansetron (ZOFRAN) 4 MG tablet Take 4 mg by mouth every 8 (eight) hours as needed for nausea or vomiting. 04/12/21   [provider]  prednisoLONE acetate (PRED FORTE) 1 % ophthalmic suspension Place 1 drop into the left eye See admin instructions. 2-3 times daily 10/15/21   [provider]  sevelamer carbonate (RENVELA) 800 MG tablet Take 2 tablets (1,600 mg total) by mouth 3 (three) times daily with meals. Patient not taking: Reported on 12/29/2021 03/05/21   Samella Parr, NP      Allergies    Amlodipine and Hydralazine    Review of Systems   Review of Systems  All other systems reviewed and are negative.   Physical Exam Updated Vital Signs BP (!) 171/115  Pulse 70   Temp 99.4 F (37.4 C) (Oral)   Resp (!) 22   Ht 6' (1.829 m)   Wt 88.5 kg   SpO2 95%   BMI 26.46 kg/m  Physical Exam Vitals and nursing note reviewed.  Constitutional:      Appearance: He is well-developed.  HENT:     Head: Atraumatic.  Cardiovascular:     Rate and Rhythm: Normal rate.  Pulmonary:     Effort: Pulmonary effort is normal.     Breath sounds: Examination of the right-lower field reveals rales. Examination of the left-lower field reveals rales. Rales present. No decreased breath sounds.  Musculoskeletal:     Cervical back: Neck supple.   Skin:    General: Skin is warm.  Neurological:     Mental Status: He is alert and oriented to person, place, and time.     ED Results / Procedures / Treatments   Labs (all labs ordered are listed, but only abnormal results are displayed) Labs Reviewed  CBC WITH DIFFERENTIAL/PLATELET - Abnormal; Notable for the following components:      Result Value   RBC 4.11 (*)    Hemoglobin 10.6 (*)    HCT 34.0 (*)    MCH 25.8 (*)    RDW 19.4 (*)    All other components within normal limits  BASIC METABOLIC PANEL - Abnormal; Notable for the following components:   Sodium 134 (*)    Potassium 5.8 (*)    Chloride 95 (*)    CO2 21 (*)    Glucose, Bld 102 (*)    BUN 89 (*)    Creatinine, Ser 16.21 (*)    Calcium 7.5 (*)    GFR, Estimated 4 (*)    Anion gap 18 (*)    All other components within normal limits  I-STAT CHEM 8, ED - Abnormal; Notable for the following components:   Sodium 132 (*)    Potassium 5.9 (*)    BUN 82 (*)    Creatinine, Ser >18.00 (*)    Calcium, Ion 0.88 (*)    Hemoglobin 12.6 (*)    HCT 37.0 (*)    All other components within normal limits  SARS CORONAVIRUS 2 BY RT PCR    EKG EKG Interpretation  Date/Time:  Monday October 04 2022 10:13:25 EDT Ventricular Rate:  71 PR Interval:  187 QRS Duration: 97 QT Interval:  429 QTC Calculation: 467 R Axis:   94 Text Interpretation: Sinus rhythm Probable left atrial enlargement Borderline right axis deviation LVH with secondary repolarization abnormality Anterior ST elevation, probably due to LVH peaked T waves in anterior leads Confirmed by Varney Biles [phone]) on 10/04/2022 10:51:41 AM  Radiology DG Chest 2 View  Result Date: 10/04/2022 CLINICAL DATA:  Shortness of breath, not able to complete dialysis due to fistula EXAM: CHEST - 2 VIEW COMPARISON:  08/13/2022 FINDINGS: Cardiomegaly. Diffuse bilateral interstitial opacity. The visualized skeletal structures are unremarkable. IMPRESSION: Cardiomegaly with  diffuse bilateral interstitial opacity, likely edema. No focal airspace opacity. Electronically Signed   By: Delanna Ahmadi M.D.   On: 10/04/2022 10:39    Procedures .Critical Care  Performed by: Varney Biles, MD Authorized by: Varney Biles, MD   Critical care provider statement:    Critical care time (minutes):  45   Critical care was necessary to treat or prevent imminent or life-threatening deterioration of the following conditions:  Renal failure, respiratory failure and metabolic crisis   Critical care was time spent personally by  me on the following activities:  Development of treatment plan with patient or surrogate, discussions with consultants, evaluation of patient's response to treatment, examination of patient, ordering and review of laboratory studies, ordering and review of radiographic studies, ordering and performing treatments and interventions, pulse oximetry, re-evaluation of patient's condition and review of old charts     Medications Ordered in ED Medications  sodium zirconium cyclosilicate (LOKELMA) packet 10 g (10 g Oral Given 10/04/22 1114)  calcium gluconate inj 10% (1 g) URGENT USE ONLY! (has no administration in time range)  insulin aspart (novoLOG) injection 5 Units (has no administration in time range)    And  dextrose 50 % solution 50 mL (has no administration in time range)  sodium bicarbonate injection 50 mEq (has no administration in time range)  ondansetron (ZOFRAN) injection 4 mg (4 mg Intravenous Given 10/04/22 1118)  cloNIDine (CATAPRES) tablet 0.2 mg (0.2 mg Oral Given 10/04/22 1116)    ED Course/ Medical Decision Making/ A&P                             Medical Decision Making This patient presents to the ED with chief complaint(s) of shortness of breath, nausea and weakness with pertinent past medical history of ESRD on hemodialysis TTS, last dialysis being 4 days ago and hypertension.patient has no URI-like symptoms.  The complaint involves an  extensive differential diagnosis and also carries with it a high risk of complications and morbidity.    The differential diagnosis considered includes severe electrolyte abnormality, including hyperkalemia and acidosis, anion gap, pulmonary edema, hypertensive emergency  The initial plan is to get basic labs and EKG.  Initial EKG does show peaked T waves in anterior leads, however patient has had hyperacute T waves in the past as well.  Additional history obtained: Records reviewed previous EKGs  Independent labs interpretation:  The following labs were independently interpreted: Potassium is 5.9  Independent visualization and interpretation of imaging: - I independently visualized the following imaging with scope of interpretation limited to determining acute life threatening conditions related to emergency care: X-ray of the chest, which revealed pulmonary edema.  Treatment and Reassessment: Patient reassessed.  Will give him hyper kalemia medications.  For his hypertension, we will give him clonidine 0.2 mg as he already takes 0.1 mg.  He is allergic to hydralazine.  He is not in any respiratory distress and not hypoxic.  Patient will need emergent/urgent dialysis.  Consultation: - Consulted or discussed management/test interpretation with external professional:  Dr. Joylene Grapes, nephrology.  He is requesting that patient be transferred to [facility]. Dr. Oswald Hillock at [facility], ED made aware. Vascular surgery indicated that they would only get involved if nephrology or IR unable to manage.  Amount and/or Complexity of Data Reviewed Labs: ordered.  Risk OTC drugs. Prescription drug management.   11:28 AM Patient made aware of the plan for her to [facility].  Next steps include consultation with nephrology as soon as patient arrives to [facility] for them to see if they can proceed with thrombectomy themselves.  IR can be consulted if nephrology cannot manage.   Final Clinical  Impression(s) / ED Diagnoses Final diagnoses:  Acute hyperkalemia  ESRD (end stage renal disease) on dialysis [facility])  Acute pulmonary edema ([place])    Rx / DC Orders ED Discharge Orders     None         Varney Biles, MD 10/04/22 1129

## 2022-10-04 NOTE — Progress Notes (Signed)
Contacted by nephrologist regarding pt's possible interest in changing out-pt HD clinics. Unsure if that can be accomplished while pt hospitalized or if pt will need to request change in clinic from home HD clinic [facility]). Message left for pt on pt's cell number requesting a return call to discuss further. Of note, pt's insurance is out-of network with all [facility] clinics. Will assist as able.   Melven Sartorius Renal Navigator [phone]

## 2022-10-04 NOTE — H&P (Signed)
History and Physical    [name] [id number] DOB: [birth year] DOA: 10/04/2022  PCP: Camillia Herter, NP (Confirm with patient/family/NH records and if not entered, this has to be entered at [facility] point of entry) Patient coming from: Home  I have personally briefly reviewed patient's old medical records in [place]  Chief Complaint: Left arm pain, SOB  HPI: [name] is a 37 y.o. male with medical history significant of ESRD on HD TTS, HTN, IDDM, chronic HFpEF secondary to hypertrophic cardiomyopathy, seizure disorder, presented with left arm pain and unable to have HD.  Patient went to have HD via left arm AV fistula last Thursday, he has some discomfort associated with HD on the left arm but completed the whole session.  He went home and started to have nausea, left arm AV fistula site pain.  Denies any fever or chills.  Saturday, he went back to have HD, and was told by staff that AV fistula site was not accessible and sent home.  This morning, patient started to develop shortness of breath and more left arm pain and came to ED.  Denies any fever chills, no abdominal pain no diarrhea.  ED Course: Blood pressure significant elevated SBP> 200.  Blood work showed K 5.9, bicarb 23, creatinine 18 BUN 82, glucose 98, WBC 8.1.  Chest x-ray showed cardiomegaly and bilateral pulmonary vascular congestion.  Patient was seen by nephrology in the ED, who ordered IR AV fistulogram and declotting.  For hyperkalemia, 1 dose of Lokelma given and cocktail including bicarb IV push and insulin and D50 given.  Review of Systems: As per HPI otherwise 14 point review of systems negative.    Past Medical History:  Diagnosis Date   ADHD (attention deficit hyperactivity disorder)    Diabetes mellitus    ESRD on hemodialysis ([place])    [facility] [place] [facility]   High cholesterol    Hypertension    Hypertrophic cardiomyopathy ([place]) 02/26/2021   Seizure ([place]) 02/08/2021    Past Surgical  History:  Procedure Laterality Date   AMPUTATION TOE Right 12/29/2021   Procedure: AMPUTATION TOE, fifth;  Surgeon: Lorenda Peck, DPM;  Location: [place];  Service: Podiatry;  Laterality: Right;  surgical team will do local block   AV FISTULA PLACEMENT Left 08/11/2020   Procedure: LEFT UPPER EXTREMITY ARTERIOVENOUS (AV) FISTULA CREATION;  Surgeon: Cherre Robins, MD;  Location: [place];  Service: Vascular;  Laterality: Left;   FISTULA SUPERFICIALIZATION Left [id number]   Procedure: PLICATION OF LEFT RADIUS CEPHALIC FISTULA;  Surgeon: Cherre Robins, MD;  Location: [place];  Service: Vascular;  Laterality: Left;  PERIPHERAL NERVE BLOCK   FRACTURE SURGERY     I & D EXTREMITY Left 07/16/2014   Procedure: IRRIGATION AND DEBRIDEMENT EXTREMITY/LEFT INDEX FINGER;  Surgeon: Leanora Cover, MD;  Location: [place];  Service: Orthopedics;  Laterality: Left;   IR PERC TUN PERIT CATH WO PORT S&I /IMAG  08/07/2020   IR US GUIDE VASC ACCESS RIGHT  08/07/2020     reports that he has never smoked. He has never been exposed to tobacco smoke. He has never used smokeless tobacco. He reports that he does not currently use alcohol. He reports that he does not use drugs.  Allergies  Allergen Reactions   Amlodipine Nausea And Vomiting and Other (See Comments)    Patient was taking Amlodipine and Hydralazine at the same time, so the reactions came from one of the 2: Lethargy and an all-over feeling of  NOT feeling well (also)   Hydralazine Nausea And Vomiting and Other (See Comments)    Patient was taking Hydralazine AND Amlodipine at the same time, so the reactions came from one of the 2: Lethargy and an all-over feeling of NOT feeling well (also)    Family History  Problem Relation Age of Onset   Cancer Mother    Stroke Father    Diabetes Father    Hypertension Father     Prior to Admission medications   Medication Sig Start Date End Date Taking? Authorizing Provider  acetaminophen (TYLENOL) 325 MG tablet Take 2  tablets (650 mg total) by mouth every 6 (six) hours as needed for mild pain (or Fever >/= 101). 12/30/21   Nita Sells, MD  atorvastatin (LIPITOR) 40 MG tablet Take 40 mg by mouth daily.    [provider]  B Complex-C-Folic Acid (RENA-VITE RX) 1 MG TABS Take 1 tablet by mouth daily. 06/28/22   [provider]  calcitRIOL (ROCALTROL) 0.5 MCG capsule Take 2 capsules (1 mcg total) by mouth Every Tuesday,Thursday,and Saturday with dialysis. 12/31/21   Nita Sells, MD  carvedilol (COREG) 25 MG tablet Take 1 tablet (25 mg total) by mouth 2 (two) times daily with a meal. 03/05/21   Samella Parr, NP  cloNIDine (CATAPRES) 0.1 MG tablet Take 0.1 mg by mouth 2 (two) times daily. 04/20/22   [provider]  insulin glargine-yfgn (SEMGLEE) 100 UNIT/ML injection Inject 0.12 mLs (12 Units total) into the skin at bedtime. 07/09/22   Camillia Herter, NP  insulin lispro (HUMALOG) 100 UNIT/ML injection Inject 1-7 Units into the skin See admin instructions. Per sliding scale 3 times daily with meals 151-200= 1 unit 201-250= 2 units 251-300= 3 units 301-350= 5 units 351-400= 7 units Greater than 400 call md takes only as needed if blood sugar is over 200    [provider]  Insulin Pen Needle (PEN NEEDLES) 31G X 8 MM MISC UAD 07/09/22   Camillia Herter, NP  Multiple Vitamins-Minerals (MULTIVITAMIN WITH MINERALS) tablet Take 1 tablet by mouth daily.    [provider]  multivitamin (RENA-VIT) TABS tablet Take 1 tablet by mouth at bedtime. Patient not taking: Reported on 10/30/2021 11/27/20   Lavina Hamman, MD  ondansetron (ZOFRAN) 4 MG tablet Take 4 mg by mouth every 8 (eight) hours as needed for nausea or vomiting. 04/12/21   [provider]  prednisoLONE acetate (PRED FORTE) 1 % ophthalmic suspension Place 1 drop into the left eye See admin instructions. 2-3 times daily 10/15/21   [provider]  sevelamer carbonate (RENVELA) 800 MG  tablet Take 2 tablets (1,600 mg total) by mouth 3 (three) times daily with meals. Patient not taking: Reported on 12/29/2021 03/05/21   Samella Parr, NP    Physical Exam: Vitals:   10/04/22 1025 10/04/22 1116 10/04/22 1145 10/04/22 1236  BP:  (!) 171/115 (!) 193/129 (!) 181/114  Pulse:   80 82  Resp:   (!) 23 19  Temp:    98.6 F (37 C)  TempSrc:    Oral  SpO2:   94% 98%  Weight: 88.5 kg     Height: 6' (1.829 m)       Constitutional: NAD, calm, comfortable Vitals:   10/04/22 1025 10/04/22 1116 10/04/22 1145 10/04/22 1236  BP:  (!) 171/115 (!) 193/129 (!) 181/114  Pulse:   80 82  Resp:   (!) 23 19  Temp:    98.6  F (37 C)  TempSrc:    Oral  SpO2:   94% 98%  Weight: 88.5 kg     Height: 6' (1.829 m)      Eyes: PERRL, lids and conjunctivae normal ENMT: Mucous membranes are moist. Posterior pharynx clear of any exudate or lesions.Normal dentition.  Neck: normal, supple, no masses, no thyromegaly Respiratory: clear to auscultation bilaterally, no wheezing, fine crackles on bilateral lung bases, with increasing breathing respiratory effort. No accessory muscle use.  Cardiovascular: Regular rate and rhythm, no murmurs / rubs / gallops. No extremity edema. 2+ pedal pulses. No carotid bruits.  Severe tenderness on left forearm AV fistula, with diminished bruits Abdomen: no tenderness, no masses palpated. No hepatosplenomegaly. Bowel sounds positive.  Musculoskeletal: no clubbing / cyanosis. No joint deformity upper and lower extremities. Good ROM, no contractures. Normal muscle tone.  Skin: no rashes, lesions, ulcers. No induration Neurologic: CN 2-12 grossly intact. Sensation intact, DTR normal. Strength 5/5 in all 4.  Psychiatric: Normal judgment and insight. Alert and oriented x 3. Normal mood.     Labs on Admission: I have personally reviewed following labs and imaging studies  CBC: Recent Labs  Lab 10/04/22 1015 10/04/22 1025  WBC 8.1  --   NEUTROABS 6.3  --   HGB  10.6* 12.6*  HCT 34.0* 37.0*  MCV 82.7  --   PLT 215  --    Basic Metabolic Panel: Recent Labs  Lab 10/04/22 1025 10/04/22 1050  NA 132* 134*  K 5.9* 5.8*  CL 100 95*  CO2  --  21*  GLUCOSE 98 102*  BUN 82* 89*  CREATININE >18.00* 16.21*  CALCIUM  --  7.5*   GFR: Estimated Creatinine Clearance: 6.8 mL/min (A) (by C-G formula based on SCr of 16.21 mg/dL (H)). Liver Function Tests: No results for input(s): "AST", "ALT", "ALKPHOS", "BILITOT", "PROT", "ALBUMIN" in the last 168 hours. No results for input(s): "LIPASE", "AMYLASE" in the last 168 hours. No results for input(s): "AMMONIA" in the last 168 hours. Coagulation Profile: No results for input(s): "INR", "PROTIME" in the last 168 hours. Cardiac Enzymes: No results for input(s): "CKTOTAL", "CKMB", "CKMBINDEX", "TROPONINI" in the last 168 hours. BNP (last 3 results) No results for input(s): "PROBNP" in the last 8760 hours. HbA1C: No results for input(s): "HGBA1C" in the last 72 hours. CBG: Recent Labs  Lab 10/04/22 1145  GLUCAP 174*   Lipid Profile: No results for input(s): "CHOL", "HDL", "LDLCALC", "TRIG", "CHOLHDL", "LDLDIRECT" in the last 72 hours. Thyroid Function Tests: No results for input(s): "TSH", "T4TOTAL", "FREET4", "T3FREE", "THYROIDAB" in the last 72 hours. Anemia Panel: No results for input(s): "VITAMINB12", "FOLATE", "FERRITIN", "TIBC", "IRON", "RETICCTPCT" in the last 72 hours. Urine analysis:    Component Value Date/Time   COLORURINE YELLOW 11/25/2020 0301   APPEARANCEUR CLEAR 11/25/2020 0301   LABSPEC 1.016 11/25/2020 0301   PHURINE 7.0 11/25/2020 0301   GLUCOSEU 50 (A) 11/25/2020 0301   HGBUR SMALL (A) 11/25/2020 0301   BILIRUBINUR NEGATIVE 11/25/2020 0301   KETONESUR NEGATIVE 11/25/2020 0301   PROTEINUR >=300 (A) 11/25/2020 0301   UROBILINOGEN 0.2 07/16/2014 1747   NITRITE NEGATIVE 11/25/2020 0301   LEUKOCYTESUR NEGATIVE 11/25/2020 0301    Radiological Exams on Admission: DG Chest 2  View  Result Date: 10/04/2022 CLINICAL DATA:  Shortness of breath, not able to complete dialysis due to fistula EXAM: CHEST - 2 VIEW COMPARISON:  08/13/2022 FINDINGS: Cardiomegaly. Diffuse bilateral interstitial opacity. The visualized skeletal structures are unremarkable. IMPRESSION: Cardiomegaly with diffuse bilateral  interstitial opacity, likely edema. No focal airspace opacity. Electronically Signed   By: Delanna Ahmadi M.D.   On: 10/04/2022 10:39    EKG: Independently reviewed.  Sinus rhythm, LVH, chronic T wave inversions on V5-V6.  Assessment/Plan Principal Problem:   AV fistula occlusion (HCC) Active Problems:   Hypertensive urgency   ESRD (end stage renal disease) on dialysis (HCC)   Acute on chronic diastolic CHF (congestive heart failure) ([place])  (please populate well all problems here in Problem List. (For example, if patient is on BP meds at home and you resume or decide to hold them, it is a problem that needs to be her. Same for CAD, COPD, HLD and so on)  Acute left arm AV fistula malfunction -Nephrology consultation appreciated, IR consulted for left arm AV fistula gram and possible declotting, plan for tomorrow morning.  HD afterwards. -Dilaudid for pain control -Other Ddx, he has no fever, no elevated WBC count, low suspicion for bacteremia at this point  Acute on chronic HFpEF decompensation -Secondary to missed dialysis -Patient reported still making urine, will initiate Lasix IV 100 mg twice daily first dose now. -More stringent blood pressure control  HTN emergency -BP meds including Coreg and clonidine -Add as needed labetalol  Hyperkalemia -No EKG T changes -Received hyper-K cocktail including insulin, D50, bicarb IV push and Lokelma in the ED -Check K level tonight, continue hyper-K cocktail if necessary.  IDDM -Glucose 98, will hold off long-acting insulin -Sliding scale for now    DVT prophylaxis: Heparin drip Code Status: Full code Family  Communication: None at bedside Disposition Plan: Expect less than 2 midnight hospital stay Consults called: Dr. Joylene Grapes Admission status: Tele obs   Lequita Halt MD [facility] Pager [phone]  10/04/2022, 1:16 PM

## 2022-10-04 NOTE — ED Notes (Signed)
Pt transferred to [facility] ED for dialysis, care link at bedside to get patient. All questions answered.NAD. Report given to [facility] at bedside for tranfer and Roselyn Reef at [facility] [place] crew)

## 2022-10-04 NOTE — ED Notes (Signed)
CBG 174  

## 2022-10-04 NOTE — Consult Note (Addendum)
ESRD Consult Note  Requesting provider: Varney Biles Service requesting consult: Hospitalist Reason for consult: ESRD, provision of dialysis Indication for acute dialysis?: End Stage Renal Disease  Outpatient dialysis [facility]: [facility] Outpatient dialysis prescription: not available  Assessment/Recommendations: [name] is a/an 37 y.o. male with a past medical history notable for ESRD on HD admitted with acess issue.   # ESRD: will try to obtain outpatient prescription. Last dialysis Thursday. Will plan on dialysis today or tomorrow after access obtained.  # Volume/ hypertension: Shortness of breath likely related to high blood pressure and volume overload.  BP high, unclear EDW. UF w/ HD and would resume home BP meds.  # Anemia of Chronic Kidney Disease: Hemoglobin at goal. No esa  # Secondary Hyperparathyroidism/Hyperphosphatemia: Ca low. Continue home calcitriol 30mcg w/ HD and home sevelamer 1600mg  TID.  # Vascular access: AVF with some pulsatility but no bruit throughout. Consider for clotted access or swelling around the access diminishing flow. Ordered PVL but will consult IR as he likely needs fistulogram +/- TDC. Appreciate help.  # Hyperkalemia: Treated with shifting and depleting agents.  Recheck later today.  Ultimately needs dialysis   # Additional recommendations: - Dose all meds for creatinine clearance < 10 ml/min  - Unless absolutely necessary, no MRIs with gadolinium.  - Implement save arm precautions.  Prefer needle sticks in the dorsum of the hands or wrists.  No blood pressure measurements in arm. - If blood transfusion is requested during hemodialysis sessions, please alert [place] prior to the session.  - Use synthetic opioids (Fentanyl/Dilaudid) if needed  Recommendations were discussed with the primary team.   History of Present Illness: [name] is a/an 37 y.o. male with a past medical history of ESRD who presents with dialysis access  issue.  Patient presented to the hospital today complaining of nausea and shortness of breath.  He gets dialysis TTS at [facility].  Last dialysis was on Thursday.  He went for dialysis on Saturday and was told that his access was clotted.  No follow-up plans were made.  States that today he developed worsening shortness of breath and nausea and decided come the emergency department for further evaluation.  He states that the nurse had an issue sticking his dialysis access on Thursday and he thinks it was an issue that led to it clotting.  He denies chest pain, vomiting, fevers, chills, cramps.  He does have a mild headache.  States that he has been compliant with his medications.  Wants to move dialysis centers.  In the emergency department he was found to be very hypertensive with systolics of around [id number].  Potassium was 5.8 and he was given medications for shifting and depletion.   Medications:  Current Facility-Administered Medications  Medication Dose Route Frequency Provider Last Rate Last Admin   calcium gluconate 1 g/ 50 mL sodium chloride IVPB  1 g Intravenous Once Varney Biles, MD 50 mL/hr at 10/04/22 1202 1,000 mg at 10/04/22 1202   sodium zirconium cyclosilicate (LOKELMA) packet 10 g  10 g Oral Daily Varney Biles, MD   10 g at 10/04/22 1114   Current Outpatient Medications  Medication Sig Dispense Refill   acetaminophen (TYLENOL) 325 MG tablet Take 2 tablets (650 mg total) by mouth every 6 (six) hours as needed for mild pain (or Fever >/= 101).     atorvastatin (LIPITOR) 40 MG tablet Take 40 mg by mouth daily.     B Complex-C-Folic Acid (RENA-VITE RX) 1  MG TABS Take 1 tablet by mouth daily.     calcitRIOL (ROCALTROL) 0.5 MCG capsule Take 2 capsules (1 mcg total) by mouth Every Tuesday,Thursday,and Saturday with dialysis. 30 capsule 1   carvedilol (COREG) 25 MG tablet Take 1 tablet (25 mg total) by mouth 2 (two) times daily with a meal.     cloNIDine (CATAPRES) 0.1 MG  tablet Take 0.1 mg by mouth 2 (two) times daily.     insulin glargine-yfgn (SEMGLEE) 100 UNIT/ML injection Inject 0.12 mLs (12 Units total) into the skin at bedtime. 10 mL 0   insulin lispro (HUMALOG) 100 UNIT/ML injection Inject 1-7 Units into the skin See admin instructions. Per sliding scale 3 times daily with meals 151-200= 1 unit 201-250= 2 units 251-300= 3 units 301-350= 5 units 351-400= 7 units Greater than 400 call md takes only as needed if blood sugar is over 200     Insulin Pen Needle (PEN NEEDLES) 31G X 8 MM MISC UAD 100 each 0   Multiple Vitamins-Minerals (MULTIVITAMIN WITH MINERALS) tablet Take 1 tablet by mouth daily.     multivitamin (RENA-VIT) TABS tablet Take 1 tablet by mouth at bedtime. (Patient not taking: Reported on 10/30/2021) 30 tablet 0   ondansetron (ZOFRAN) 4 MG tablet Take 4 mg by mouth every 8 (eight) hours as needed for nausea or vomiting.     prednisoLONE acetate (PRED FORTE) 1 % ophthalmic suspension Place 1 drop into the left eye See admin instructions. 2-3 times daily     sevelamer carbonate (RENVELA) 800 MG tablet Take 2 tablets (1,600 mg total) by mouth 3 (three) times daily with meals. (Patient not taking: Reported on 12/29/2021)       ALLERGIES Amlodipine and Hydralazine  MEDICAL HISTORY Past Medical History:  Diagnosis Date   ADHD (attention deficit hyperactivity disorder)    Diabetes mellitus    ESRD on hemodialysis (HCC)    [facility]   High cholesterol    Hypertension    Hypertrophic cardiomyopathy ([place]) 02/26/2021   Seizure ([place]) 02/08/2021     SOCIAL HISTORY Social History   Socioeconomic History   Marital status: Single    Spouse name: Not on file   Number of children: Not on file   Years of education: Not on file   Highest education level: Not on file  Occupational History   Occupation: disabled  Tobacco Use   Smoking status: Never    Passive exposure: Never   Smokeless tobacco: Never  Vaping Use   Vaping Use:  Never used  Substance and Sexual Activity   Alcohol use: Not Currently    Comment: social drinker   Drug use: No   Sexual activity: Yes  Other Topics Concern   Not on file  Social History Narrative   Not on file   Social Determinants of Health   Financial Resource Strain: Not on file  Food Insecurity: Not on file  Transportation Needs: Not on file  Physical Activity: Not on file  Stress: Not on file  Social Connections: Not on file  Intimate Partner Violence: Not on file     FAMILY HISTORY Family History  Problem Relation Age of Onset   Cancer Mother    Stroke Father    Diabetes Father    Hypertension Father      Review of Systems: 12 systems were reviewed and negative except per HPI  Physical Exam: Vitals:   10/04/22 1116 10/04/22 1145  BP: (!) 171/115 (!) 193/129  Pulse:  80  Resp:  (!) 23  Temp:    SpO2:  94%   No intake/output data recorded. No intake or output data in the 24 hours ending 10/04/22 1219 General: well-appearing, no acute distress HEENT: anicteric sclera, MMM CV: normal rate, no murmurs, trace lower extremity edema Lungs: bilateral chest rise, normal wob Abd: soft, non-tender, non-distended Skin: no visible lesions or rashes Psych: alert, engaged, appropriate mood and affect Neuro: normal speech, no gross focal deficits   Test Results Reviewed Lab Results  Component Value Date   NA 134 (L) 10/04/2022   K 5.8 (H) 10/04/2022   CL 95 (L) 10/04/2022   CO2 21 (L) 10/04/2022   BUN 89 (H) 10/04/2022   CREATININE 16.21 (H) 10/04/2022   CALCIUM 7.5 (L) 10/04/2022   ALBUMIN 3.8 08/13/2022   PHOS 4.0 03/23/2021    I have reviewed relevant outside healthcare records

## 2022-10-04 NOTE — ED Notes (Signed)
Nurse Albina Billet made aware of critical value on iStat Chem 8 by this Probation officer.

## 2022-10-04 NOTE — ED Notes (Signed)
Christina c, rn

## 2022-10-05 ENCOUNTER — Observation Stay (HOSPITAL_COMMUNITY): Payer: Medicaid Other

## 2022-10-05 ENCOUNTER — Other Ambulatory Visit: Payer: Self-pay

## 2022-10-05 DIAGNOSIS — Z79899 Other long term (current) drug therapy: Secondary | ICD-10-CM | POA: Diagnosis not present

## 2022-10-05 DIAGNOSIS — I422 Other hypertrophic cardiomyopathy: Secondary | ICD-10-CM | POA: Diagnosis present

## 2022-10-05 DIAGNOSIS — N186 End stage renal disease: Secondary | ICD-10-CM

## 2022-10-05 DIAGNOSIS — E875 Hyperkalemia: Secondary | ICD-10-CM | POA: Diagnosis present

## 2022-10-05 DIAGNOSIS — N2581 Secondary hyperparathyroidism of renal origin: Secondary | ICD-10-CM | POA: Diagnosis present

## 2022-10-05 DIAGNOSIS — I132 Hypertensive heart and chronic kidney disease with heart failure and with stage 5 chronic kidney disease, or end stage renal disease: Secondary | ICD-10-CM | POA: Diagnosis present

## 2022-10-05 DIAGNOSIS — Z5329 Procedure and treatment not carried out because of patient's decision for other reasons: Secondary | ICD-10-CM | POA: Diagnosis present

## 2022-10-05 DIAGNOSIS — Z1152 Encounter for screening for COVID-19: Secondary | ICD-10-CM | POA: Diagnosis not present

## 2022-10-05 DIAGNOSIS — G40909 Epilepsy, unspecified, not intractable, without status epilepticus: Secondary | ICD-10-CM | POA: Diagnosis present

## 2022-10-05 DIAGNOSIS — I160 Hypertensive urgency: Secondary | ICD-10-CM | POA: Diagnosis not present

## 2022-10-05 DIAGNOSIS — F909 Attention-deficit hyperactivity disorder, unspecified type: Secondary | ICD-10-CM | POA: Diagnosis present

## 2022-10-05 DIAGNOSIS — J810 Acute pulmonary edema: Secondary | ICD-10-CM | POA: Diagnosis not present

## 2022-10-05 DIAGNOSIS — D631 Anemia in chronic kidney disease: Secondary | ICD-10-CM | POA: Diagnosis present

## 2022-10-05 DIAGNOSIS — I5033 Acute on chronic diastolic (congestive) heart failure: Secondary | ICD-10-CM | POA: Diagnosis not present

## 2022-10-05 DIAGNOSIS — Y832 Surgical operation with anastomosis, bypass or graft as the cause of abnormal reaction of the patient, or of later complication, without mention of misadventure at the time of the procedure: Secondary | ICD-10-CM | POA: Diagnosis present

## 2022-10-05 DIAGNOSIS — Z89421 Acquired absence of other right toe(s): Secondary | ICD-10-CM | POA: Diagnosis not present

## 2022-10-05 DIAGNOSIS — Z8249 Family history of ischemic heart disease and other diseases of the circulatory system: Secondary | ICD-10-CM | POA: Diagnosis not present

## 2022-10-05 DIAGNOSIS — T82898A Other specified complication of vascular prosthetic devices, implants and grafts, initial encounter: Secondary | ICD-10-CM

## 2022-10-05 DIAGNOSIS — E7800 Pure hypercholesterolemia, unspecified: Secondary | ICD-10-CM | POA: Diagnosis present

## 2022-10-05 DIAGNOSIS — E1122 Type 2 diabetes mellitus with diabetic chronic kidney disease: Secondary | ICD-10-CM | POA: Diagnosis present

## 2022-10-05 DIAGNOSIS — Z992 Dependence on renal dialysis: Secondary | ICD-10-CM | POA: Diagnosis not present

## 2022-10-05 DIAGNOSIS — T82868A Thrombosis of vascular prosthetic devices, implants and grafts, initial encounter: Secondary | ICD-10-CM | POA: Diagnosis present

## 2022-10-05 DIAGNOSIS — Z823 Family history of stroke: Secondary | ICD-10-CM | POA: Diagnosis not present

## 2022-10-05 DIAGNOSIS — Z794 Long term (current) use of insulin: Secondary | ICD-10-CM | POA: Diagnosis not present

## 2022-10-05 DIAGNOSIS — I161 Hypertensive emergency: Secondary | ICD-10-CM | POA: Diagnosis present

## 2022-10-05 DIAGNOSIS — Z888 Allergy status to other drugs, medicaments and biological substances status: Secondary | ICD-10-CM | POA: Diagnosis not present

## 2022-10-05 DIAGNOSIS — Z5941 Food insecurity: Secondary | ICD-10-CM | POA: Diagnosis not present

## 2022-10-05 HISTORY — PX: IR THROMBECTOMY AV FISTULA W/THROMBOLYSIS/PTA INC/SHUNT/IMG LEFT: IMG6106

## 2022-10-05 HISTORY — PX: IR DECLOT THROMB AGENT IMPLANT VASC DEVICE/CATH: IMG2293

## 2022-10-05 HISTORY — PX: IR FLUORO GUIDE CV LINE RIGHT: IMG2283

## 2022-10-05 HISTORY — PX: IR US GUIDE VASC ACCESS RIGHT: IMG2390

## 2022-10-05 LAB — BASIC METABOLIC PANEL
Anion gap: 13 (ref 5–15)
Anion gap: 13 (ref 5–15)
BUN: 46 mg/dL — ABNORMAL HIGH (ref 6–20)
BUN: 30 mg/dL — ABNORMAL HIGH (ref 6–20)
CO2: 27 mmol/L (ref 22–32)
CO2: 26 mmol/L (ref 22–32)
Calcium: 8 mg/dL — ABNORMAL LOW (ref 8.9–10.3)
Calcium: 8.1 mg/dL — ABNORMAL LOW (ref 8.9–10.3)
Chloride: 92 mmol/L — ABNORMAL LOW (ref 98–111)
Chloride: 95 mmol/L — ABNORMAL LOW (ref 98–111)
Creatinine, Ser: 9.91 mg/dL — ABNORMAL HIGH (ref 0.61–1.24)
Creatinine, Ser: 7.33 mg/dL — ABNORMAL HIGH (ref 0.61–1.24)
GFR, Estimated: 6 mL/min — ABNORMAL LOW (ref >60)
GFR, Estimated: 9 mL/min — ABNORMAL LOW (ref >60)
Glucose, Bld: 141 mg/dL — ABNORMAL HIGH (ref 70–99)
Glucose, Bld: 129 mg/dL — ABNORMAL HIGH (ref 70–99)
Potassium: 4.2 mmol/L (ref 3.5–5.1)
Potassium: 4 mmol/L (ref 3.5–5.1)
Sodium: 132 mmol/L — ABNORMAL LOW (ref 135–145)
Sodium: 134 mmol/L — ABNORMAL LOW (ref 135–145)

## 2022-10-05 LAB — HEPATITIS B SURFACE ANTIBODY, QUANTITATIVE: Hep B S AB Quant (Post): >1000 m[IU]/mL

## 2022-10-05 LAB — GLUCOSE, CAPILLARY
Glucose-Capillary: 119 mg/dL — ABNORMAL HIGH (ref 70–99)
Glucose-Capillary: 125 mg/dL — ABNORMAL HIGH (ref 70–99)
Glucose-Capillary: 185 mg/dL — ABNORMAL HIGH (ref 70–99)

## 2022-10-05 LAB — HIV ANTIBODY (ROUTINE TESTING W REFLEX): HIV Screen 4th Generation wRfx: NONREACTIVE

## 2022-10-05 MED ORDER — CARVEDILOL 25 MG PO TABS
25.0000 mg | ORAL_TABLET | Freq: Two times a day (BID) | ORAL | Status: DC
  Administered 2022-10-05 – 2022-10-06 (×3): 25 mg via ORAL
  Filled 2022-10-05 (×3): qty 1

## 2022-10-05 MED ORDER — MIDAZOLAM HCL 2 MG/2ML IJ SOLN
INTRAMUSCULAR | Status: AC
  Filled 2022-10-05: qty 2

## 2022-10-05 MED ORDER — SODIUM CHLORIDE 0.9 % IV SOLN: 250.0000 mL | INTRAVENOUS | Status: DC | PRN

## 2022-10-05 MED ORDER — CLONIDINE HCL 0.1 MG PO TABS
0.1000 mg | ORAL_TABLET | Freq: Two times a day (BID) | ORAL | Status: DC
  Administered 2022-10-05 – 2022-10-06 (×3): 0.1 mg via ORAL
  Filled 2022-10-05 (×3): qty 1

## 2022-10-05 MED ORDER — SODIUM CHLORIDE 0.9% FLUSH: 3.0000 mL | INTRAVENOUS | Status: DC | PRN

## 2022-10-05 MED ORDER — FENTANYL CITRATE (PF) 100 MCG/2ML IJ SOLN
INTRAMUSCULAR | Status: AC
  Filled 2022-10-05: qty 2

## 2022-10-05 MED ORDER — HEPARIN SODIUM (PORCINE) 1000 UNIT/ML IJ SOLN
INTRAMUSCULAR | Status: AC
  Filled 2022-10-05: qty 10

## 2022-10-05 MED ORDER — INSULIN ASPART 100 UNIT/ML IJ SOLN
0.0000 [IU] | Freq: Three times a day (TID) | INTRAMUSCULAR | Status: DC
  Administered 2022-10-05: 1 [IU] via SUBCUTANEOUS

## 2022-10-05 MED ORDER — HYDRALAZINE HCL 20 MG/ML IJ SOLN
INTRAMUSCULAR | Status: AC
  Filled 2022-10-05: qty 1

## 2022-10-05 MED ORDER — LIDOCAINE-EPINEPHRINE 1 %-1:100000 IJ SOLN
INTRAMUSCULAR | Status: AC
  Administered 2022-10-05: 15 mL
  Filled 2022-10-05: qty 1

## 2022-10-05 MED ORDER — LIDOCAINE HCL (PF) 1 % IJ SOLN: 5.0000 mL | INTRAMUSCULAR | Status: DC | PRN

## 2022-10-05 MED ORDER — GELATIN ABSORBABLE 12-7 MM EX MISC
CUTANEOUS | Status: AC
  Filled 2022-10-05: qty 1

## 2022-10-05 MED ORDER — HEPARIN SODIUM (PORCINE) 1000 UNIT/ML IJ SOLN
INTRAMUSCULAR | Status: AC
  Administered 2022-10-05: 2600 [IU]
  Filled 2022-10-05: qty 4

## 2022-10-05 MED ORDER — ALTEPLASE 2 MG IJ SOLR: 2.0000 mg | Freq: Once | INTRAMUSCULAR | Status: DC | PRN

## 2022-10-05 MED ORDER — ALTEPLASE 2 MG IJ SOLR
INTRAMUSCULAR | Status: AC | PRN
  Administered 2022-10-05: 4 mg

## 2022-10-05 MED ORDER — CALCITRIOL 0.5 MCG PO CAPS
1.0000 ug | ORAL_CAPSULE | ORAL | Status: DC
  Administered 2022-10-05: 1 ug via ORAL
  Filled 2022-10-05: qty 2

## 2022-10-05 MED ORDER — LIDOCAINE-PRILOCAINE 2.5-2.5 % EX CREA: 1.0000 | TOPICAL_CREAM | CUTANEOUS | Status: DC | PRN

## 2022-10-05 MED ORDER — ONDANSETRON HCL 4 MG/2ML IJ SOLN
4.0000 mg | Freq: Four times a day (QID) | INTRAMUSCULAR | Status: DC | PRN
  Administered 2022-10-05 – 2022-10-07 (×3): 4 mg via INTRAVENOUS
  Filled 2022-10-05 (×3): qty 2

## 2022-10-05 MED ORDER — ATORVASTATIN CALCIUM 40 MG PO TABS
40.0000 mg | ORAL_TABLET | Freq: Every day | ORAL | Status: DC
  Administered 2022-10-05 – 2022-10-06 (×2): 40 mg via ORAL
  Filled 2022-10-05 (×2): qty 1

## 2022-10-05 MED ORDER — SODIUM CHLORIDE 0.9% FLUSH
3.0000 mL | Freq: Two times a day (BID) | INTRAVENOUS | Status: DC
  Administered 2022-10-05 – 2022-10-06 (×3): 3 mL via INTRAVENOUS

## 2022-10-05 MED ORDER — PREDNISOLONE ACETATE 1 % OP SUSP
1.0000 [drp] | Freq: Three times a day (TID) | OPHTHALMIC | Status: DC
  Administered 2022-10-05 – 2022-10-06 (×4): 1 [drp] via OPHTHALMIC
  Filled 2022-10-05 (×2): qty 5

## 2022-10-05 MED ORDER — MIDAZOLAM HCL 2 MG/2ML IJ SOLN
INTRAMUSCULAR | Status: AC | PRN
  Administered 2022-10-05 (×3): .5 mg via INTRAVENOUS
  Administered 2022-10-05 (×2): 1 mg via INTRAVENOUS
  Administered 2022-10-05: .5 mg via INTRAVENOUS

## 2022-10-05 MED ORDER — HYDRALAZINE HCL 20 MG/ML IJ SOLN
INTRAMUSCULAR | Status: AC | PRN
  Administered 2022-10-05 (×2): 10 mg via INTRAVENOUS

## 2022-10-05 MED ORDER — LIDOCAINE HCL 1 % IJ SOLN
INTRAMUSCULAR | Status: AC
  Administered 2022-10-05: 10 mL
  Filled 2022-10-05: qty 20

## 2022-10-05 MED ORDER — PENTAFLUOROPROP-TETRAFLUOROETH EX AERO: 1.0000 | INHALATION_SPRAY | CUTANEOUS | Status: DC | PRN

## 2022-10-05 MED ORDER — CHLORHEXIDINE GLUCONATE 4 % EX LIQD
CUTANEOUS | Status: AC
  Filled 2022-10-05: qty 15

## 2022-10-05 MED ORDER — ALTEPLASE 2 MG IJ SOLR
INTRAMUSCULAR | Status: AC
  Filled 2022-10-05: qty 4

## 2022-10-05 MED ORDER — GELATIN ABSORBABLE 12-7 MM EX MISC
1.0000 | Freq: Once | CUTANEOUS | Status: AC
  Administered 2022-10-05: 1 via TOPICAL
  Filled 2022-10-05: qty 1

## 2022-10-05 MED ORDER — ACETAMINOPHEN 325 MG PO TABS: 650.0000 mg | ORAL_TABLET | ORAL | Status: DC | PRN

## 2022-10-05 MED ORDER — "THROMBI-PAD 3""X3"" EX PADS"
1.0000 | MEDICATED_PAD | Freq: Once | CUTANEOUS | Status: DC
  Filled 2022-10-05: qty 1

## 2022-10-05 MED ORDER — HEPARIN SODIUM (PORCINE) 5000 UNIT/ML IJ SOLN: 5000.0000 [IU] | Freq: Two times a day (BID) | INTRAMUSCULAR | Status: DC

## 2022-10-05 MED ORDER — FENTANYL CITRATE (PF) 100 MCG/2ML IJ SOLN
INTRAMUSCULAR | Status: AC | PRN
  Administered 2022-10-05: 25 ug via INTRAVENOUS
  Administered 2022-10-05: 50 ug via INTRAVENOUS
  Administered 2022-10-05 (×3): 25 ug via INTRAVENOUS
  Administered 2022-10-05: 50 ug via INTRAVENOUS

## 2022-10-05 MED ORDER — ACETAMINOPHEN 325 MG PO TABS: 650.0000 mg | ORAL_TABLET | Freq: Four times a day (QID) | ORAL | Status: DC | PRN

## 2022-10-05 MED ORDER — HEPARIN SODIUM (PORCINE) 1000 UNIT/ML IJ SOLN
INTRAMUSCULAR | Status: AC | PRN
  Administered 2022-10-05: 3.2 mL via INTRAVENOUS
  Administered 2022-10-05: 4000 [IU] via INTRAVENOUS

## 2022-10-05 MED ORDER — IOHEXOL 300 MG/ML  SOLN
100.0000 mL | Freq: Once | INTRAMUSCULAR | Status: AC | PRN
  Administered 2022-10-05: 60 mL via INTRAVENOUS

## 2022-10-05 NOTE — Progress Notes (Signed)
MEDICATION-RELATED CONSULT NOTE   IR Procedure Consult - Anticoagulant/Antiplatelet PTA/Inpatient Med List Review by Pharmacist    Procedure: declot of left forearm fistula; placement of tunneled HD catheter    Completed: 16:34  Post-Procedural bleeding risk per IR MD assessment:  standard  Antithrombotic medications on inpatient or PTA profile prior to procedure:   none prior to procedure; heparin 5000 units SQ q12h is scheduled to begin at 10pm tonight for VTE prophylaxis   Recommended restart time per IR Post-Procedure Guidelines:    - at least 6 hours  - EBL: trace  Plan:     Moved first SQ heparin dose from 10pm to 11pm tonight only.  [name], [place] 10/05/2022 4:48 PM

## 2022-10-05 NOTE — Progress Notes (Signed)
Heart Failure Navigator Progress Note  Assessed for Heart & Vascular TOC clinic readiness.  Patient does not meet criteria due to ESRD on hemodialysis.   Navigator will sign off at this time.    Pamila Mendibles, BSN, RN Heart Failure Nurse Navigator Secure Chat Only   

## 2022-10-05 NOTE — Progress Notes (Addendum)
TRH night cross cover note:   I was notified by RN of some bleeding over last few hours from permacath placed earlier today. I recommended conservative measures and notifying IV team if hemostasis not achieved with these interventions. Will continue to monitor.   Update: pressure dressing applied. IV team consult, will do re-dressing, and request order for surgifoam, which I have now placed. IR contacted and will see pt tomorrow (3/27).   Update: some additional bleeding from site of permacath. I ordered topical txa, and requested pressure to the site. I also discontinued the patient's twice daily heparin, which have been ordered for DVT prophylaxis, and replace this with SCDs. Also ordered additional Surgicel pads (alternative to thrombi pads, which are currently on backorder).     Babs Bertin, DO Hospitalist

## 2022-10-05 NOTE — Progress Notes (Signed)
[facility] Progress Note   Subjective:   Seen on HD, using temp cath. Plan is for fistulogram today. Reports mild headache this AM. Denies SOB, CP, dizziness and nausea.   Objective Vitals:   10/05/22 0110 10/05/22 0607 10/05/22 0745 10/05/22 0800  BP: (!) 172/103 (!) 163/101 (!) 188/119 (!) 178/110  Pulse: 80 81 76 75  Resp: 15 18 18 18   Temp: 98.2 F (36.8 C) 98.4 F (36.9 C) 98.4 F (36.9 C)   TempSrc: Oral Oral    SpO2: 97% 97% 98% 96%  Weight:      Height:       Physical Exam General: Alert male in NAD Heart: RRR, no murmurs, rubs or gallops Lungs: CTA bilaterally Abdomen: Soft, non-distended, +BS Extremities: Np edema b/l lower extermities Dialysis Access:  AVF weakly pulsatile and aneurysmal. Catheter accessed  Additional Objective Labs: Basic Metabolic Panel: Recent Labs  Lab 10/04/22 1050 10/04/22 1758 10/05/22 0403  NA 134* 132* 132*  K 5.8* 5.0 4.2  CL 95* 95* 92*  CO2 21* 19* 27  GLUCOSE 102* 97 141*  BUN 89* 94* 46*  CREATININE 16.21* 16.95* 9.91*  CALCIUM 7.5* 7.5* 8.0*   Liver Function Tests: No results for input(s): "AST", "ALT", "ALKPHOS", "BILITOT", "PROT", "ALBUMIN" in the last 168 hours. No results for input(s): "LIPASE", "AMYLASE" in the last 168 hours. CBC: Recent Labs  Lab 10/04/22 1015 10/04/22 1025  WBC 8.1  --   NEUTROABS 6.3  --   HGB 10.6* 12.6*  HCT 34.0* 37.0*  MCV 82.7  --   PLT 215  --    Blood Culture    Component Value Date/Time   SDES WOUND 12/29/2021 1910   SPECREQUEST RIGHT FIFTH TOE TEST VANCO CEFEPIME 12/29/2021 1910   CULT  12/29/2021 1910    RARE STAPHYLOCOCCUS AUREUS NO ANAEROBES ISOLATED Performed at [facility], [place] [address], [place], [place] [address]    REPTSTATUS 01/03/2022 FINAL 12/29/2021 1910    Cardiac Enzymes: No results for input(s): "CKTOTAL", "CKMB", "CKMBINDEX", "TROPONINI" in the last 168 hours. CBG: Recent Labs  Lab 10/04/22 1145 10/04/22 1720  10/05/22 0012  GLUCAP 174* 90 119*   Iron Studies: No results for input(s): "IRON", "TIBC", "TRANSFERRIN", "FERRITIN" in the last 72 hours. @lablastinr3 @ Studies/Results: IR Fluoro Guide CV Line Right  Result Date: 10/04/2022 CLINICAL DATA:  Occluded dialysis fistula and need for temporary dialysis catheter for immediate dialysis needs. EXAM: TUNNELED CENTRAL VENOUS HEMODIALYSIS CATHETER PLACEMENT WITH ULTRASOUND AND FLUOROSCOPIC GUIDANCE ANESTHESIA/SEDATION: None MEDICATIONS: None FLUOROSCOPY: 30 seconds.  12.0 mGy. PROCEDURE: The procedure, risks, benefits, and alternatives were explained to the patient. Questions regarding the procedure were encouraged and answered. The patient understands and consents to the procedure. A timeout was performed prior to initiating the procedure. The right neck and chest were prepped with chlorhexidine in a sterile fashion, and a sterile drape was applied covering the operative field. Maximum barrier sterile technique with sterile gowns and gloves were used for the procedure. Local anesthesia was provided with 1% lidocaine. Ultrasound was used to confirm patency of the right internal jugular vein. A permanent ultrasound image was saved and recorded. After creating a small venotomy incision, a 21 gauge needle was advanced into the right internal jugular vein under direct, real-time ultrasound guidance. Ultrasound image documentation was performed. After securing guidewire access the venotomy was dilated over the wire. A 16 cm, 13 French, triple lumen Mahurkar non tunneled hemodialysis catheter was chosen for placement. The catheter was then  placed over the guidewire. Final catheter positioning was confirmed and documented with a fluoroscopic spot image. The catheter was aspirated, flushed with saline, and injected with appropriate volume heparin dwells. The catheter exit site was secured with 0-Prolene retention sutures. COMPLICATIONS: None.  No pneumothorax. FINDINGS:  After catheter placement, the tip lies at the SVC/RA junction. The catheter aspirates normally and is ready for immediate use. IMPRESSION: Placement of non tunneled hemodialysis catheter via the right internal jugular vein. The catheter tip lies at the SVC/RA junction. The catheter is ready for immediate use. Electronically Signed   By: Aletta Edouard M.D.   On: 10/04/2022 17:01   IR US Guide Vasc Access Right  Result Date: 10/04/2022 CLINICAL DATA:  Occluded dialysis fistula and need for temporary dialysis catheter for immediate dialysis needs. EXAM: TUNNELED CENTRAL VENOUS HEMODIALYSIS CATHETER PLACEMENT WITH ULTRASOUND AND FLUOROSCOPIC GUIDANCE ANESTHESIA/SEDATION: None MEDICATIONS: None FLUOROSCOPY: 30 seconds.  12.0 mGy. PROCEDURE: The procedure, risks, benefits, and alternatives were explained to the patient. Questions regarding the procedure were encouraged and answered. The patient understands and consents to the procedure. A timeout was performed prior to initiating the procedure. The right neck and chest were prepped with chlorhexidine in a sterile fashion, and a sterile drape was applied covering the operative field. Maximum barrier sterile technique with sterile gowns and gloves were used for the procedure. Local anesthesia was provided with 1% lidocaine. Ultrasound was used to confirm patency of the right internal jugular vein. A permanent ultrasound image was saved and recorded. After creating a small venotomy incision, a 21 gauge needle was advanced into the right internal jugular vein under direct, real-time ultrasound guidance. Ultrasound image documentation was performed. After securing guidewire access the venotomy was dilated over the wire. A 16 cm, 13 French, triple lumen Mahurkar non tunneled hemodialysis catheter was chosen for placement. The catheter was then placed over the guidewire. Final catheter positioning was confirmed and documented with a fluoroscopic spot image. The catheter  was aspirated, flushed with saline, and injected with appropriate volume heparin dwells. The catheter exit site was secured with 0-Prolene retention sutures. COMPLICATIONS: None.  No pneumothorax. FINDINGS: After catheter placement, the tip lies at the SVC/RA junction. The catheter aspirates normally and is ready for immediate use. IMPRESSION: Placement of non tunneled hemodialysis catheter via the right internal jugular vein. The catheter tip lies at the SVC/RA junction. The catheter is ready for immediate use. Electronically Signed   By: Aletta Edouard M.D.   On: 10/04/2022 17:01   DG Chest 2 View  Result Date: 10/04/2022 CLINICAL DATA:  Shortness of breath, not able to complete dialysis due to fistula EXAM: CHEST - 2 VIEW COMPARISON:  08/13/2022 FINDINGS: Cardiomegaly. Diffuse bilateral interstitial opacity. The visualized skeletal structures are unremarkable. IMPRESSION: Cardiomegaly with diffuse bilateral interstitial opacity, likely edema. No focal airspace opacity. Electronically Signed   By: Delanna Ahmadi M.D.   On: 10/04/2022 10:39   Medications:  furosemide Stopped (10/04/22 1503)    calcitRIOL  1 mcg Oral Q T,Th,Sa-HD   Chlorhexidine Gluconate Cloth  6 each Topical Q0600   sevelamer carbonate  1,600 mg Oral TID WC   sodium zirconium cyclosilicate  10 g Oral Daily    Dialysis Orders: [facility]  Assessment/Plan: # ESRD: Missed last HD due to vascular access issue. Tolerating dialysis well today. Plan for fistulogram tomorrow, see below.    # Volume/ hypertension: BP high today but no SOB. Attempting 3L UF with HD   # Anemia of Chronic Kidney  Disease: Hemoglobin at goal. No esa   # Secondary Hyperparathyroidism/Hyperphosphatemia: Ca low. Continue home calcitriol 11mcg w/ HD and home sevelamer 1600mg  TID.   # Vascular access: AVF with some pulsatility but no bruit throughout. Temporary catheter in place, IR planning fistulogram   # Hyperkalemia: Resolved with lokelma  yesterday, K+ 4.2 today.     # Additional recommendations: - Dose all meds for creatinine clearance < 10 ml/min  - Unless absolutely necessary, no MRIs with gadolinium.  - Implement save arm precautions.  Prefer needle sticks in the dorsum of the hands or wrists.  No blood pressure measurements in arm. - If blood transfusion is requested during hemodialysis sessions, please alert [place] prior to the session.  - Use synthetic opioids (Fentanyl/Dilaudid) if needed    Anice Paganini, PA-C 10/05/2022, 8:35 AM  [facility] Pager: [phone]

## 2022-10-05 NOTE — Progress Notes (Signed)
[facility]  PROGRESS NOTE  [name] [id number] DOB: 26-Jun-1986 DOA: 10/04/2022 PCP: Camillia Herter, NP   Brief HPI:   37 year old male with history of ESRD on HD TTS, hypertension, diabetes mellitus type 2, chronic HFpEF, secondary to hypertrophic cardiomyopathy, seizure disorder came with left arm pain and was unable to have hemodialysis.  Patient went to have HD via left arm AV fistula last Thursday but had discomfort with HD in the left arm but completed the whole session.  He went home and started to have nausea, left arm fistula site pain. Patient came to the ED and was found to have SBP elevated to more than 200 mg Hg.  Nephrology was consulted.  IR consulted for AV fistulogram and declotting.  Chest x-ray showed cardiomegaly and bilateral pulmonary vascular congestion.    Subjective   Patient had tunneled dialysis catheter placed per IR this morning.  Also underwent hemodialysis.  He was started on Lasix 100 mg IV every 12 hours yesterday.   Assessment/Plan:   Acute left arm AV fistula malfunction -IR consulted for AV fistulogram and possible declotting -Nephrology following  Hypertensive urgency -Blood pressure has been elevated -Home medications will be started -Continue Coreg, clonidine, labetalol as needed  Acute on chronic HFpEF -Started on IV Lasix 100 mg every 12 hours -Got dialysis today -Will discontinue IV Lasix  Diabetes mellitus type 2 -Continue very sensitive sliding scale insulin with NovoLog -CBG well-controlled    Medications     atorvastatin  40 mg Oral Daily   calcitRIOL  1 mcg Oral Q T,Th,Sa-HD   carvedilol  25 mg Oral BID WC   chlorhexidine       Chlorhexidine Gluconate Cloth  6 each Topical Q0600   cloNIDine  0.1 mg Oral BID   gelatin adsorbable       heparin  5,000 Units Subcutaneous Q12H   insulin aspart  0-6 Units Subcutaneous TID WC   prednisoLONE acetate  1 drop Left Eye See admin instructions   sevelamer carbonate   1,600 mg Oral TID WC   sodium chloride flush  3 mL Intravenous Q12H     Data Reviewed:   CBG:  Recent Labs  Lab 10/04/22 1145 10/04/22 1720 10/05/22 0012  GLUCAP 174* 90 119*    SpO2: 94 % O2 Flow Rate (L/min): 4 L/min    Vitals:   10/05/22 1435 10/05/22 1440 10/05/22 1450 10/05/22 1531  BP: (!) 204/121 (!) 199/120 (!) 183/110 (!) 177/98  Pulse: 85 88 88 97  Resp: (!) 21 18 20 18   Temp:    98.6 F (37 C)  TempSrc:    Oral  SpO2: 100% 100% 96% 94%  Weight:      Height:          Data Reviewed:  Basic Metabolic Panel: Recent Labs  Lab 10/04/22 1025 10/04/22 1050 10/04/22 1758 10/05/22 0403  NA 132* 134* 132* 132*  K 5.9* 5.8* 5.0 4.2  CL 100 95* 95* 92*  CO2  --  21* 19* 27  GLUCOSE 98 102* 97 141*  BUN 82* 89* 94* 46*  CREATININE >18.00* 16.21* 16.95* 9.91*  CALCIUM  --  7.5* 7.5* 8.0*    CBC: Recent Labs  Lab 10/04/22 1015 10/04/22 1025  WBC 8.1  --   NEUTROABS 6.3  --   HGB 10.6* 12.6*  HCT 34.0* 37.0*  MCV 82.7  --   PLT 215  --     LFT No results for input(s): "AST", "ALT", "  ALKPHOS", "BILITOT", "PROT", "ALBUMIN" in the last 168 hours.   Antibiotics: Anti-infectives (From admission, onward)    None        DVT prophylaxis: Heparin  Code Status: Full code  Family Communication: No family at bedside   CONSULTS nephrology, IR   Objective    Physical Examination:   General-appears in no acute distress Heart-S1-S2, regular, no murmur auscultated Lungs-clear to auscultation bilaterally, no wheezing or crackles auscultated Abdomen-soft, nontender, no organomegaly Extremities-no edema in the lower extremities Neuro-alert, oriented x3, no focal deficit noted  Status is: Inpatient:             Oswald Hillock   [facility] If 7PM-7AM, please contact night-coverage at [email or web address], Office  [phone]   10/05/2022, 3:49 PM  LOS: 0 days

## 2022-10-05 NOTE — Progress Notes (Signed)
Met with pt at bedside this morning while pt receiving HD. Pt is interested in transferring to a [facility] clinic here in [place]. Explained to pt that at this time, [facility] does not accept pt's insurance. Explained to pt that there are 2 clinics in [facility] if he has any interest in them. Pt states he wants to think about what he wants to do. Pt advised that his home clinic can assist with a transfer to another clinic should pt be interested in that option. Update provided to nephrologist as well. Pt currently receives out-pt HD at [facility] on TTS. Will assist as needed.   Melven Sartorius Renal Navigator [phone]

## 2022-10-05 NOTE — Procedures (Signed)
Pre-procedure Diagnosis: ESRD; Clotted left forearm fistula. Post-procedure Diagnosis: Same  Technically successful declot of left forearm fistula though with persistent non-occlusive thrombus.   Fistula is ready for immediate attempted cannulation.  Given sub optimal result of declot, I placed a tunneled HD catheter with tips terminating within the superior aspect of the right atrium to allow for reliable dialysis access.   The catheter is ready for immediate use.   Complications: None Immediate EBL: Trace   Ronny Bacon, MD Pager #: [phone]

## 2022-10-05 NOTE — Progress Notes (Signed)
2040, patient noted to be still bleeding on newly inserted perm cath, per patient this is the 3rd time since 1600 that he had to be changed up due to soaking from blood.  Paged Howerter D. Called Hemodialysis and they said to call Interventional Radiology for next plan of action. Paged Sandi Mariscal MD and he said to put pressure on site and neck to control bleeding, IV team consulted fro dressing change. Surgifoam asked to be provided by IV team, ordered and was told to call them once available. bleeding controlled after pressure dressing applied.  At 2155 IV consult ordered as dressing is available at bedside.  2200 patient had episode of vomiting, Iv team to return once patient is stable.  2300 dressing changed care of IV team.

## 2022-10-05 NOTE — Progress Notes (Signed)
Instructed nurse to have MD order Thrombipad for HD site. Reports that it continues to bleed since placement. Instructed to place another consult when Thrombipad arrives to unit. Staff nurse currently holding pressure to site. VU. Fran Lowes, RN VAST

## 2022-10-05 NOTE — Procedures (Signed)
I was present at this dialysis session. I have reviewed the session itself and made appropriate changes.   HD again today for volume removal and resume outpatient schedule. Declot today with IR. Appreciate help.  Filed Weights   10/04/22 1025 10/04/22 1930 10/04/22 2348  Weight: 88.5 kg 88.5 kg 84.8 kg    Recent Labs  Lab 10/05/22 0403  NA 132*  K 4.2  CL 92*  CO2 27  GLUCOSE 141*  BUN 46*  CREATININE 9.91*  CALCIUM 8.0*    Recent Labs  Lab 10/04/22 1015 10/04/22 1025  WBC 8.1  --   NEUTROABS 6.3  --   HGB 10.6* 12.6*  HCT 34.0* 37.0*  MCV 82.7  --   PLT 215  --     Scheduled Meds:  calcitRIOL  1 mcg Oral Q T,Th,Sa-HD   Chlorhexidine Gluconate Cloth  6 each Topical Q0600   heparin sodium (porcine)       sevelamer carbonate  1,600 mg Oral TID WC   Continuous Infusions:  furosemide Stopped (10/04/22 1503)   PRN Meds:.alteplase, heparin sodium (porcine), HYDROmorphone (DILAUDID) injection, labetalol, lidocaine (PF), lidocaine-prilocaine, ondansetron (ZOFRAN) IV, pentafluoroprop-tetrafluoroeth   Santiago Bumpers,  MD 10/05/2022, 9:39 AM

## 2022-10-05 NOTE — Progress Notes (Signed)
   10/05/22 1124  Vitals  Temp 98.8 F (37.1 C)  Pulse Rate 80  Resp 19  BP (!) 168/56  SpO2 97 %  O2 Device Room Air  Weight  (unable to weigh)  Type of Weight Post-Dialysis  Post Treatment  Dialyzer Clearance Lightly streaked  Duration of HD Treatment -hour(s) 3 hour(s)  Hemodialysis Intake (mL) 0 mL  Liters Processed 72  Fluid Removed (mL) 3000 mL  Tolerated HD Treatment Yes  Post-Hemodialysis Comments to IR for declot from HD   Received patient in bed to unit.  Alert and oriented.  Informed consent signed and in chart.   TX duration:3.0  Patient tolerated well.  Transported back to the room  Alert, without acute distress.  Hand-off given to patient's nurse.   Access used: RIJ DLC Access issues: no complications  Total UF removed: 3000 Medication(s) given: none PT going straight to IR for a declot from HD  [facility]

## 2022-10-06 DIAGNOSIS — I160 Hypertensive urgency: Secondary | ICD-10-CM | POA: Diagnosis not present

## 2022-10-06 DIAGNOSIS — J810 Acute pulmonary edema: Secondary | ICD-10-CM

## 2022-10-06 DIAGNOSIS — T82898A Other specified complication of vascular prosthetic devices, implants and grafts, initial encounter: Secondary | ICD-10-CM | POA: Diagnosis not present

## 2022-10-06 DIAGNOSIS — N186 End stage renal disease: Secondary | ICD-10-CM | POA: Diagnosis not present

## 2022-10-06 LAB — GLUCOSE, CAPILLARY
Glucose-Capillary: 112 mg/dL — ABNORMAL HIGH (ref 70–99)
Glucose-Capillary: 120 mg/dL — ABNORMAL HIGH (ref 70–99)
Glucose-Capillary: 110 mg/dL — ABNORMAL HIGH (ref 70–99)
Glucose-Capillary: 115 mg/dL — ABNORMAL HIGH (ref 70–99)

## 2022-10-06 LAB — BASIC METABOLIC PANEL
Anion gap: 13 (ref 5–15)
BUN: 37 mg/dL — ABNORMAL HIGH (ref 6–20)
CO2: 27 mmol/L (ref 22–32)
Calcium: 8 mg/dL — ABNORMAL LOW (ref 8.9–10.3)
Chloride: 93 mmol/L — ABNORMAL LOW (ref 98–111)
Creatinine, Ser: 8.93 mg/dL — ABNORMAL HIGH (ref 0.61–1.24)
GFR, Estimated: 7 mL/min — ABNORMAL LOW (ref >60)
Glucose, Bld: 109 mg/dL — ABNORMAL HIGH (ref 70–99)
Potassium: 4.3 mmol/L (ref 3.5–5.1)
Sodium: 133 mmol/L — ABNORMAL LOW (ref 135–145)

## 2022-10-06 MED ORDER — GABAPENTIN 100 MG PO CAPS
100.0000 mg | ORAL_CAPSULE | Freq: Every day | ORAL | Status: DC
  Administered 2022-10-06: 100 mg via ORAL
  Filled 2022-10-06: qty 1

## 2022-10-06 MED ORDER — TRANEXAMIC ACID 1000 MG/10ML IV SOLN: 2000.0000 mg | Freq: Once | INTRAVENOUS | Status: DC

## 2022-10-06 MED ORDER — CHLORHEXIDINE GLUCONATE CLOTH 2 % EX PADS
6.0000 | MEDICATED_PAD | Freq: Every day | CUTANEOUS | Status: DC
  Administered 2022-10-06: 6 via TOPICAL

## 2022-10-06 MED ORDER — TRANEXAMIC ACID FOR EPISTAXIS
1000.0000 mg | Freq: Once | TOPICAL | Status: AC
  Administered 2022-10-06: 1000 mg via TOPICAL
  Filled 2022-10-06 (×2): qty 10

## 2022-10-06 MED ORDER — OXIDIZED CELLULOSE EX PADS
1.0000 | MEDICATED_PAD | Freq: Once | CUTANEOUS | Status: AC
  Administered 2022-10-06: 1 via TOPICAL
  Filled 2022-10-06: qty 1

## 2022-10-06 NOTE — Plan of Care (Signed)
  Problem: Safety: Goal: Ability to remain free from injury will improve Outcome: Not Progressing   Problem: Pain Managment: Goal: General experience of comfort will improve Outcome: Not Progressing   Problem: Clinical Measurements: Goal: Diagnostic test results will improve Outcome: Not Progressing

## 2022-10-06 NOTE — Progress Notes (Signed)
[facility]  PROGRESS NOTE  [name] [id number] DOB: 07/02/1986 DOA: 10/04/2022 PCP: Camillia Herter, NP   Brief HPI:   37 year old male with history of ESRD on HD TTS, hypertension, diabetes mellitus type 2, chronic HFpEF, secondary to hypertrophic cardiomyopathy, seizure disorder came with left arm pain and was unable to have hemodialysis.  Patient went to have HD via left arm AV fistula last Thursday but had discomfort with HD in the left arm but completed the whole session.  He went home and started to have nausea, left arm fistula site pain. Patient came to the ED and was found to have SBP elevated to more than 200 mg Hg.  Nephrology was consulted.  IR consulted for AV fistulogram and declotting.  Chest x-ray showed cardiomegaly and bilateral pulmonary vascular congestion.    Subjective   Patient seen and examined, denies any complaints.   Assessment/Plan:   Acute left arm AV fistula malfunction -IR consulted for AV fistulogram and possible declotting -Nephrology following -Vascular surgery consulted  Hypertensive urgency -Blood pressure has been elevated -Home medications will be started -Continue Coreg, clonidine, labetalol as needed  Acute on chronic HFpEF -Started on IV Lasix 100 mg every 12 hours -Got dialysis today -IV Lasix was discontinued  Diabetes mellitus type 2 -Continue very sensitive sliding scale insulin with NovoLog -CBG well-controlled    Medications     atorvastatin  40 mg Oral QHS   calcitRIOL  1 mcg Oral Q T,Th,Sa-HD   carvedilol  25 mg Oral BID WC   Chlorhexidine Gluconate Cloth  6 each Topical Q0600   Chlorhexidine Gluconate Cloth  6 each Topical Q0600   cloNIDine  0.1 mg Oral BID   gabapentin  100 mg Oral QHS   insulin aspart  0-6 Units Subcutaneous TID WC   prednisoLONE acetate  1 drop Left Eye Q8H   sevelamer carbonate  1,600 mg Oral TID WC   sodium chloride flush  3 mL Intravenous Q12H     Data Reviewed:    CBG:  Recent Labs  Lab 10/05/22 1642 10/05/22 2130 10/06/22 0733 10/06/22 1142 10/06/22 1535  GLUCAP 185* 125* 112* 120* 110*    SpO2: 100 % O2 Flow Rate (L/min): 4 L/min    Vitals:   10/06/22 0423 10/06/22 0537 10/06/22 0926 10/06/22 1525  BP: (!) 118/55  (!) 141/86 138/79  Pulse: 75  74 77  Resp: 20  16 17   Temp: 97.8 F (36.6 C)  98.7 F (37.1 C) 98.2 F (36.8 C)  TempSrc: Oral  Oral Oral  SpO2: 92%  99% 100%  Weight:  88.8 kg    Height:          Data Reviewed:  Basic Metabolic Panel: Recent Labs  Lab 10/04/22 1050 10/04/22 1758 10/05/22 0403 10/05/22 1608 10/06/22 0228  NA 134* 132* 132* 134* 133*  K 5.8* 5.0 4.2 4.0 4.3  CL 95* 95* 92* 95* 93*  CO2 21* 19* 27 26 27   GLUCOSE 102* 97 141* 129* 109*  BUN 89* 94* 46* 30* 37*  CREATININE 16.21* 16.95* 9.91* 7.33* 8.93*  CALCIUM 7.5* 7.5* 8.0* 8.1* 8.0*    CBC: Recent Labs  Lab 10/04/22 1015 10/04/22 1025  WBC 8.1  --   NEUTROABS 6.3  --   HGB 10.6* 12.6*  HCT 34.0* 37.0*  MCV 82.7  --   PLT 215  --     LFT No results for input(s): "AST", "ALT", "ALKPHOS", "BILITOT", "PROT", "ALBUMIN" in the last 168  hours.   Antibiotics: Anti-infectives (From admission, onward)    None        DVT prophylaxis: Heparin  Code Status: Full code  Family Communication: No family at bedside   CONSULTS nephrology, IR   Objective    Physical Examination:   General-appears in no acute distress Heart-S1-S2, regular, no murmur auscultated Lungs-clear to auscultation bilaterally, no wheezing or crackles auscultated Abdomen-soft, nontender, no organomegaly Extremities-no edema in the lower extremities Neuro-alert, oriented x3, no focal deficit noted  Status is: Inpatient:             Oswald Hillock   [facility] If 7PM-7AM, please contact night-coverage at [email or web address], Office  [phone]   10/06/2022, 3:49 PM  LOS: 1 day

## 2022-10-06 NOTE — Progress Notes (Signed)
[facility] Progress Note   Subjective:   S/p declot yesterday but had persistent non-occlusive thrombus, TDC placed as well. Had some bleeding from AVF overnight. Denies SOB, CP, dizziness and nausea.   Objective Vitals:   10/05/22 1531 10/05/22 2128 10/06/22 0423 10/06/22 0537  BP: (!) 177/98 (!) 155/96 (!) 118/55   Pulse: 97 89 75   Resp: 18 18 20    Temp: 98.6 F (37 C) 98 F (36.7 C) 97.8 F (36.6 C)   TempSrc: Oral  Oral   SpO2: 94% 96% 92%   Weight:    88.8 kg  Height:       Physical Exam General: Alert male in NAD Heart: RRR, no murmurs, rubs or gallops Lungs: CTA bilaterally Abdomen: Soft, non-distended, +BS Extremities: Np edema b/l lower extermities Dialysis Access:  AVF + bruit, aneurysmal.  newTDC  Additional Objective Labs: Basic Metabolic Panel: Recent Labs  Lab 10/05/22 0403 10/05/22 1608 10/06/22 0228  NA 132* 134* 133*  K 4.2 4.0 4.3  CL 92* 95* 93*  CO2 27 26 27   GLUCOSE 141* 129* 109*  BUN 46* 30* 37*  CREATININE 9.91* 7.33* 8.93*  CALCIUM 8.0* 8.1* 8.0*   Liver Function Tests: No results for input(s): "AST", "ALT", "ALKPHOS", "BILITOT", "PROT", "ALBUMIN" in the last 168 hours. No results for input(s): "LIPASE", "AMYLASE" in the last 168 hours. CBC: Recent Labs  Lab 10/04/22 1015 10/04/22 1025  WBC 8.1  --   NEUTROABS 6.3  --   HGB 10.6* 12.6*  HCT 34.0* 37.0*  MCV 82.7  --   PLT 215  --    Blood Culture    Component Value Date/Time   SDES WOUND 12/29/2021 1910   SPECREQUEST RIGHT FIFTH TOE TEST VANCO CEFEPIME 12/29/2021 1910   CULT  12/29/2021 1910    RARE STAPHYLOCOCCUS AUREUS NO ANAEROBES ISOLATED Performed at [facility], [place] [address], [place], [place] [address]    REPTSTATUS 01/03/2022 FINAL 12/29/2021 1910    Cardiac Enzymes: No results for input(s): "CKTOTAL", "CKMB", "CKMBINDEX", "TROPONINI" in the last 168 hours. CBG: Recent Labs  Lab 10/04/22 1720 10/05/22 0012 10/05/22 1642  10/05/22 2130 10/06/22 0733  GLUCAP 90 119* 185* 125* 112*   Iron Studies: No results for input(s): "IRON", "TIBC", "TRANSFERRIN", "FERRITIN" in the last 72 hours. @lablastinr3 @ Studies/Results: IR Fluoro Guide CV Line Right  Result Date: 10/04/2022 CLINICAL DATA:  Occluded dialysis fistula and need for temporary dialysis catheter for immediate dialysis needs. EXAM: TUNNELED CENTRAL VENOUS HEMODIALYSIS CATHETER PLACEMENT WITH ULTRASOUND AND FLUOROSCOPIC GUIDANCE ANESTHESIA/SEDATION: None MEDICATIONS: None FLUOROSCOPY: 30 seconds.  12.0 mGy. PROCEDURE: The procedure, risks, benefits, and alternatives were explained to the patient. Questions regarding the procedure were encouraged and answered. The patient understands and consents to the procedure. A timeout was performed prior to initiating the procedure. The right neck and chest were prepped with chlorhexidine in a sterile fashion, and a sterile drape was applied covering the operative field. Maximum barrier sterile technique with sterile gowns and gloves were used for the procedure. Local anesthesia was provided with 1% lidocaine. Ultrasound was used to confirm patency of the right internal jugular vein. A permanent ultrasound image was saved and recorded. After creating a small venotomy incision, a 21 gauge needle was advanced into the right internal jugular vein under direct, real-time ultrasound guidance. Ultrasound image documentation was performed. After securing guidewire access the venotomy was dilated over the wire. A 16 cm, 13 French, triple lumen Mahurkar non tunneled hemodialysis catheter was  chosen for placement. The catheter was then placed over the guidewire. Final catheter positioning was confirmed and documented with a fluoroscopic spot image. The catheter was aspirated, flushed with saline, and injected with appropriate volume heparin dwells. The catheter exit site was secured with 0-Prolene retention sutures. COMPLICATIONS: None.  No  pneumothorax. FINDINGS: After catheter placement, the tip lies at the SVC/RA junction. The catheter aspirates normally and is ready for immediate use. IMPRESSION: Placement of non tunneled hemodialysis catheter via the right internal jugular vein. The catheter tip lies at the SVC/RA junction. The catheter is ready for immediate use. Electronically Signed   By: Aletta Edouard M.D.   On: 10/04/2022 17:01   IR US Guide Vasc Access Right  Result Date: 10/04/2022 CLINICAL DATA:  Occluded dialysis fistula and need for temporary dialysis catheter for immediate dialysis needs. EXAM: TUNNELED CENTRAL VENOUS HEMODIALYSIS CATHETER PLACEMENT WITH ULTRASOUND AND FLUOROSCOPIC GUIDANCE ANESTHESIA/SEDATION: None MEDICATIONS: None FLUOROSCOPY: 30 seconds.  12.0 mGy. PROCEDURE: The procedure, risks, benefits, and alternatives were explained to the patient. Questions regarding the procedure were encouraged and answered. The patient understands and consents to the procedure. A timeout was performed prior to initiating the procedure. The right neck and chest were prepped with chlorhexidine in a sterile fashion, and a sterile drape was applied covering the operative field. Maximum barrier sterile technique with sterile gowns and gloves were used for the procedure. Local anesthesia was provided with 1% lidocaine. Ultrasound was used to confirm patency of the right internal jugular vein. A permanent ultrasound image was saved and recorded. After creating a small venotomy incision, a 21 gauge needle was advanced into the right internal jugular vein under direct, real-time ultrasound guidance. Ultrasound image documentation was performed. After securing guidewire access the venotomy was dilated over the wire. A 16 cm, 13 French, triple lumen Mahurkar non tunneled hemodialysis catheter was chosen for placement. The catheter was then placed over the guidewire. Final catheter positioning was confirmed and documented with a fluoroscopic  spot image. The catheter was aspirated, flushed with saline, and injected with appropriate volume heparin dwells. The catheter exit site was secured with 0-Prolene retention sutures. COMPLICATIONS: None.  No pneumothorax. FINDINGS: After catheter placement, the tip lies at the SVC/RA junction. The catheter aspirates normally and is ready for immediate use. IMPRESSION: Placement of non tunneled hemodialysis catheter via the right internal jugular vein. The catheter tip lies at the SVC/RA junction. The catheter is ready for immediate use. Electronically Signed   By: Aletta Edouard M.D.   On: 10/04/2022 17:01   DG Chest 2 View  Result Date: 10/04/2022 CLINICAL DATA:  Shortness of breath, not able to complete dialysis due to fistula EXAM: CHEST - 2 VIEW COMPARISON:  08/13/2022 FINDINGS: Cardiomegaly. Diffuse bilateral interstitial opacity. The visualized skeletal structures are unremarkable. IMPRESSION: Cardiomegaly with diffuse bilateral interstitial opacity, likely edema. No focal airspace opacity. Electronically Signed   By: Delanna Ahmadi M.D.   On: 10/04/2022 10:39   Medications:  sodium chloride      atorvastatin  40 mg Oral QHS   calcitRIOL  1 mcg Oral Q T,Th,Sa-HD   carvedilol  25 mg Oral BID WC   Chlorhexidine Gluconate Cloth  6 each Topical Q0600   cloNIDine  0.1 mg Oral BID   gabapentin  100 mg Oral QHS   insulin aspart  0-6 Units Subcutaneous TID WC   prednisoLONE acetate  1 drop Left Eye Q8H   sevelamer carbonate  1,600 mg Oral TID WC   sodium  chloride flush  3 mL Intravenous Q12H    OP Dialysis Orders: [facility]   Assessment/Plan: # ESRD;/vascular access issue: Missed last HD due to vascular access issue. S/p declot but still had thrombus so TDC placed. Will try to use AVF next HD with [facility] as back up option. Consulting vascular surgery to further evaluate AVF   # Volume/ hypertension: BP improved after dialysis, continue carvedilol and volume management with HD   # Anemia  of Chronic Kidney Disease: Hemoglobin at goal. No esa   # Secondary Hyperparathyroidism/Hyperphosphatemia: Ca controlled. Continue home calcitriol 72mcg w/ HD and home sevelamer 1600mg  TID.   # Hyperkalemia: Resolved     # Additional recommendations: - Dose all meds for creatinine clearance < 10 ml/min  - Unless absolutely necessary, no MRIs with gadolinium.  - Implement save arm precautions.  Prefer needle sticks in the dorsum of the hands or wrists.  No blood pressure measurements in arm. - If blood transfusion is requested during hemodialysis sessions, please alert [place] prior to the session.  - Use synthetic opioids (Fentanyl/Dilaudid) if needed    Anice Paganini, PA-C 10/06/2022, 9:03 AM  [facility] Pager: [phone]

## 2022-10-06 NOTE — Progress Notes (Signed)
Callled for a oozing tunneled perm cath to Right [place] site after multiple attempts of changing dressings, manual pressure. Large clots removed with old dressing. Some clot remains around insertion site and catheter. Surgicell gauze and TXA soaked gauze applied to site and gauze dressing applied per MD order. Pt tolerated well.

## 2022-10-07 ENCOUNTER — Telehealth: Payer: Self-pay

## 2022-10-07 DIAGNOSIS — N186 End stage renal disease: Secondary | ICD-10-CM | POA: Diagnosis not present

## 2022-10-07 DIAGNOSIS — T82898A Other specified complication of vascular prosthetic devices, implants and grafts, initial encounter: Secondary | ICD-10-CM | POA: Diagnosis not present

## 2022-10-07 DIAGNOSIS — I5033 Acute on chronic diastolic (congestive) heart failure: Secondary | ICD-10-CM | POA: Diagnosis not present

## 2022-10-07 DIAGNOSIS — J810 Acute pulmonary edema: Secondary | ICD-10-CM | POA: Diagnosis not present

## 2022-10-07 LAB — BASIC METABOLIC PANEL
Anion gap: 15 (ref 5–15)
BUN: 69 mg/dL — ABNORMAL HIGH (ref 6–20)
CO2: 25 mmol/L (ref 22–32)
Calcium: 7.8 mg/dL — ABNORMAL LOW (ref 8.9–10.3)
Chloride: 91 mmol/L — ABNORMAL LOW (ref 98–111)
Creatinine, Ser: 12.08 mg/dL — ABNORMAL HIGH (ref 0.61–1.24)
GFR, Estimated: 5 mL/min — ABNORMAL LOW (ref >60)
Glucose, Bld: 130 mg/dL — ABNORMAL HIGH (ref 70–99)
Potassium: 5.5 mmol/L — ABNORMAL HIGH (ref 3.5–5.1)
Sodium: 131 mmol/L — ABNORMAL LOW (ref 135–145)

## 2022-10-07 LAB — CBC
HCT: 30.8 % — ABNORMAL LOW (ref 39.0–52.0)
Hemoglobin: 9.9 g/dL — ABNORMAL LOW (ref 13.0–17.0)
MCH: 26.1 pg (ref 26.0–34.0)
MCHC: 32.1 g/dL (ref 30.0–36.0)
MCV: 81.1 fL (ref 80.0–100.0)
Platelets: 203 10*3/uL (ref 150–400)
RBC: 3.8 MIL/uL — ABNORMAL LOW (ref 4.22–5.81)
RDW: 18.7 % — ABNORMAL HIGH (ref 11.5–15.5)
WBC: 7 10*3/uL (ref 4.0–10.5)
nRBC: 0 % (ref 0.0–0.2)

## 2022-10-07 LAB — GLUCOSE, CAPILLARY: Glucose-Capillary: 115 mg/dL — ABNORMAL HIGH (ref 70–99)

## 2022-10-07 MED ORDER — ALTEPLASE 2 MG IJ SOLR: 2.0000 mg | Freq: Once | INTRAMUSCULAR | Status: DC | PRN

## 2022-10-07 MED ORDER — ANTICOAGULANT SODIUM CITRATE 4% (200MG/5ML) IV SOLN
5.0000 mL | Status: DC | PRN
  Filled 2022-10-07: qty 5

## 2022-10-07 MED ORDER — HEPARIN SODIUM (PORCINE) 1000 UNIT/ML DIALYSIS
1000.0000 [IU] | INTRAMUSCULAR | Status: DC | PRN
  Administered 2022-10-07: 1000 [IU]
  Filled 2022-10-07: qty 1

## 2022-10-07 NOTE — Procedures (Signed)
  Notified by RN that HD cath has been bleeding.  On exam, bulky gauze in place - all removed. Gauze was saturated with blood.  I removed all clotted blood from the catheter and surround skin.  There was active bleeding seen from the tract.  I prepped the skin with betadine and draped the area in a sterile fashion.  I used 53mL of 1% Lidocaine with epi to anesthetize the skin.  I then used a 0 Prolene and place it circumferentially in a figure 8 fashion to gain hemostasis.  I monitored the site for 5 minutes. There was no bleeding.  Sterile gauze and Tegaderm placed.  Ok to use HD cath.  Leonardo Makris S Baptiste Littler PA-C 10/07/2022 8:28 AM

## 2022-10-07 NOTE — Consult Note (Addendum)
Hospital Consult    Reason for Consult:  Malfunctioning Left AV fistula Requesting Physician:  Anice Paganini PA-C MRN #:  [id number]  History of Present Illness: This is a 37 y.o. male who presented to [facility] on 3/25 due to left arm pain and inability to dialyze at HD. He is well known to [facility] from creation of his left radiocephalic Fistula in January of 2022 by Dr. 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 IR was consulted and performed an attempted declot of the fistula on 3/26 however noted to have persistent non occlusive thrombus that was unable to be removed. A TDC was also placed at the time. Patient seen in HD and per patient his fistula has not been attempted to be used since declot. His TDC is being used. He expresses frustration as to why his fistula stopped working. Vascular surgery was consulted for consideration of possible other management options of the left AV fistula.   Past Medical History:  Diagnosis Date   ADHD (attention deficit hyperactivity disorder)    Diabetes mellitus    ESRD on hemodialysis ([place])    [facility]   High cholesterol    Hypertension    Hypertrophic cardiomyopathy ([place]) 02/26/2021   Seizure ([place]) 02/08/2021    Past Surgical History:  Procedure Laterality Date   AMPUTATION TOE Right 12/29/2021   Procedure: AMPUTATION TOE, fifth;  Surgeon: Lorenda Peck, DPM;  Location: [place];  Service: Podiatry;  Laterality: Right;  surgical team will do local block   AV FISTULA PLACEMENT Left 08/11/2020   Procedure: LEFT UPPER EXTREMITY ARTERIOVENOUS (AV) FISTULA CREATION;  Surgeon: Cherre Devaney Segers, MD;  Location: [place];  Service: Vascular;  Laterality: Left;   FISTULA SUPERFICIALIZATION Left [id number]   Procedure: PLICATION OF LEFT RADIUS CEPHALIC FISTULA;  Surgeon: Cherre Joshalyn Ancheta, MD;  Location: [place];  Service: Vascular;  Laterality: Left;  PERIPHERAL NERVE BLOCK   FRACTURE SURGERY     I & D EXTREMITY Left  07/16/2014   Procedure: IRRIGATION AND DEBRIDEMENT EXTREMITY/LEFT INDEX FINGER;  Surgeon: Leanora Cover, MD;  Location: [place];  Service: Orthopedics;  Laterality: Left;   IR DECLOT THROMB AGENT IMPLANT VASC DEVICE/CATH  10/05/2022   IR FLUORO GUIDE CV LINE RIGHT  10/04/2022   IR FLUORO GUIDE CV LINE RIGHT  10/05/2022   IR PERC TUN PERIT CATH WO PORT S&I /IMAG  08/07/2020   IR US GUIDE VASC ACCESS RIGHT  08/07/2020   IR US GUIDE VASC ACCESS RIGHT  10/04/2022   IR US GUIDE VASC ACCESS RIGHT  10/05/2022    Allergies  Allergen Reactions   Amlodipine Nausea And Vomiting and Other (See Comments)    Patient was taking Amlodipine and Hydralazine at the same time, so the reactions came from one of the 2: Lethargy and an all-over feeling of NOT feeling well (also)   Hydralazine Nausea And Vomiting and Other (See Comments)    Patient was taking Hydralazine AND Amlodipine at the same time, so the reactions came from one of the 2: Lethargy and an all-over feeling of NOT feeling well (also)    Prior to Admission medications   Medication Sig Start Date End Date Taking? Authorizing Provider  acetaminophen (TYLENOL) 325 MG tablet Take 2 tablets (650 mg total) by mouth every 6 (six) hours as needed for mild pain (or Fever >/= 101). 12/30/21   Nita Sells, MD  atorvastatin (LIPITOR) 40 MG tablet Take 40 mg by mouth daily.  [provider]  B Complex-C-Folic Acid (RENA-VITE RX) 1 MG TABS Take 1 tablet by mouth daily. 06/28/22   [provider]  calcitRIOL (ROCALTROL) 0.5 MCG capsule Take 2 capsules (1 mcg total) by mouth Every Tuesday,Thursday,and Saturday with dialysis. 12/31/21   Nita Sells, MD  carvedilol (COREG) 25 MG tablet Take 1 tablet (25 mg total) by mouth 2 (two) times daily with a meal. 03/05/21   Samella Parr, NP  cloNIDine (CATAPRES) 0.1 MG tablet Take 0.1 mg by mouth 2 (two) times daily. 04/20/22   [provider]  insulin glargine-yfgn (SEMGLEE) 100 UNIT/ML  injection Inject 0.12 mLs (12 Units total) into the skin at bedtime. 07/09/22   Camillia Herter, NP  insulin lispro (HUMALOG) 100 UNIT/ML injection Inject 1-7 Units into the skin See admin instructions. Per sliding scale 3 times daily with meals 151-200= 1 unit 201-250= 2 units 251-300= 3 units 301-350= 5 units 351-400= 7 units Greater than 400 call md takes only as needed if blood sugar is over 200    [provider]  Insulin Pen Needle (PEN NEEDLES) 31G X 8 MM MISC UAD 07/09/22   Camillia Herter, NP  Multiple Vitamins-Minerals (MULTIVITAMIN WITH MINERALS) tablet Take 1 tablet by mouth daily.    [provider]  multivitamin (RENA-VIT) TABS tablet Take 1 tablet by mouth at bedtime. Patient not taking: Reported on 10/30/2021 11/27/20   Lavina Hamman, MD  ondansetron (ZOFRAN) 4 MG tablet Take 4 mg by mouth every 8 (eight) hours as needed for nausea or vomiting. 04/12/21   [provider]  prednisoLONE acetate (PRED FORTE) 1 % ophthalmic suspension Place 1 drop into the left eye See admin instructions. 2-3 times daily 10/15/21   [provider]  sevelamer carbonate (RENVELA) 800 MG tablet Take 2 tablets (1,600 mg total) by mouth 3 (three) times daily with meals. Patient not taking: Reported on 12/29/2021 03/05/21   Samella Parr, NP    Social History   Socioeconomic History   Marital status: Single    Spouse name: Not on file   Number of children: Not on file   Years of education: Not on file   Highest education level: Not on file  Occupational History   Occupation: disabled  Tobacco Use   Smoking status: Never    Passive exposure: Never   Smokeless tobacco: Never  Vaping Use   Vaping Use: Never used  Substance and Sexual Activity   Alcohol use: Not Currently    Comment: social drinker   Drug use: No   Sexual activity: Yes  Other Topics Concern   Not on file  Social History Narrative   Not on file   Social Determinants of Health    Financial Resource Strain: Not on file  Food Insecurity: Food Insecurity Present (10/05/2022)   Hunger Vital Sign    Worried About Running Out of Food in the Last Year: Sometimes true    Ran Out of Food in the Last Year: Never true  Transportation Needs: No Transportation Needs (10/05/2022)   PRAPARE - Hydrologist (Medical): No    Lack of Transportation (Non-Medical): No  Physical Activity: Not on file  Stress: Not on file  Social Connections: Not on file  Intimate Partner Violence: Not At Risk (10/05/2022)   Humiliation, Afraid, Rape, and Kick questionnaire    Fear of Current or Ex-Partner: No    Emotionally Abused: No    Physically Abused: No  Sexually Abused: No     Family History  Problem Relation Age of Onset   Cancer Mother    Stroke Father    Diabetes Father    Hypertension Father     ROS: Otherwise negative unless mentioned in HPI  Physical Examination  Vitals:   10/07/22 0902 10/07/22 0930  BP: (!) 163/110 (!) 160/102  Pulse: 71 72  Resp: 19 15  Temp:    SpO2: 99% 99%   Body mass index is 26.7 kg/m.  General:  WDWN in NAD Gait: Not observed HENT: WNL, normocephalic Pulmonary: normal non-labored breathing Cardiac: regular Vascular Exam/Pulses: 2+ left radial pulse, left hand warm. Left radiocephalic AV fistula has several  aneurysmal areas, no overlying skin thinning. There is some pulsatility present but good thrill. No swelling or tenderness Musculoskeletal: no muscle wasting or atrophy  Neurologic: A&O X 3;  No focal weakness or paresthesias are detected; speech is fluent/normal Psychiatric:  The pt has Normal affect.  CBC    Component Value Date/Time   WBC 7.0 10/07/2022 0832   RBC 3.80 (L) 10/07/2022 0832   HGB 9.9 (L) 10/07/2022 0832   HGB 8.1 (L) 09/16/2020 1356   HCT 30.8 (L) 10/07/2022 0832   HCT 26.5 (L) 09/16/2020 1356   PLT 203 10/07/2022 0832   PLT 272 09/16/2020 1356   MCV 81.1 10/07/2022 0832    MCV 86 09/16/2020 1356   MCH 26.1 10/07/2022 0832   MCHC 32.1 10/07/2022 0832   RDW 18.7 (H) 10/07/2022 0832   RDW 15.3 09/16/2020 1356   LYMPHSABS 0.8 10/04/2022 1015   MONOABS 0.7 10/04/2022 1015   EOSABS 0.2 10/04/2022 1015   BASOSABS 0.0 10/04/2022 1015    BMET    Component Value Date/Time   NA 131 (L) 10/07/2022 0357   NA 143 09/16/2020 1356   K 5.5 (H) 10/07/2022 0357   CL 91 (L) 10/07/2022 0357   CO2 25 10/07/2022 0357   GLUCOSE 130 (H) 10/07/2022 0357   BUN 69 (H) 10/07/2022 0357   BUN 40 (H) 09/16/2020 1356   CREATININE 12.08 (H) 10/07/2022 0357   CALCIUM 7.8 (L) 10/07/2022 0357   CALCIUM 7.2 (L) 08/08/2020 0534   GFRNONAA 5 (L) 10/07/2022 0357   GFRAA 19 (L) 03/10/2020 1410    COAGS: Lab Results  Component Value Date   INR 1.1 08/11/2020   INR 1.1 05/09/2020   INR 1.0 02/29/2020     Non-Invasive Vascular Imaging:   None  Statin:  Yes.   Beta Blocker:  Yes.   Aspirin:  No. ACEI:  No. ARB:  No. CCB use:  No Other antiplatelets/anticoagulants:  No.    ASSESSMENT/PLAN: This is a 37 y.o. male with malfunction left radiocephalic AV fistula. Attempted declot by IR which was successful however residual thrombus present that could not be removed. Per patient his fistula has not been cannulated since declot. I discussed this with Anice Paganini PA and she will verify that this is the case. Okay to attempt to cannulate. However suspect that he will continue to have ongoing issues with the fistula. Has functioning TDC that can be used. I discussed option for open thrombectomy attempt with patient and possible need to convert to upper arm brachiocephalic fistula if thrombectomy unsuccessful. We could do this as early as today, however patient says he would rather not have surgery today and says he hasn't eaten since yesterday at 5 pm and wants to eat. He would prefer to have surgery another day. Okay for  discharge from our standpoint. Would benefit from addition of  antiplatelet therapy with his clotting. The earliest we can arrange his surgery will be early next week. He dialyzes on TTS so will attempt to arrange this as an outpatient next week.    Karoline Caldwell PA-C [facility] [phone] 10/07/2022  9:50 AM  VASCULAR STAFF ADDENDUM: I have independently interviewed and examined the patient. I agree with the above.  Nichlos was seen at bedside, with only 15 minutes of dialysis left.  He was currently being dialyzed through a tunneled dialysis catheter.  On exam of his left arm Cimino fistula, there was some pulsatility within the aneurysmal portion, but an excellent thrill more proximally in the antecubital fossa.Residual thrombus is common after fistula declot, especially in the fistula with aneurysmal portions like Harrington's. I asked for the fistula to be cannulated to see if it would work and to assess pressures within the fistula.  Unfortunately due to miscommunication, this was not performed.  He would benefit from attempted cannulation as there is a nice thrill.  My plan is to see Alani next week in the office after attempted HD through the fistula.   If he has issues with cannulation, high-pressure, thrombus, we discussed open thrombectomy, fistula revision.  If there are no issues, I would not offer surgery. At the time of his visit, I will order a fistula ultrasound.  Cassandria Santee, MD [facility] Phone Number: [phone] 10/07/2022 12:54 PM

## 2022-10-07 NOTE — Progress Notes (Signed)
Pt  discharged to home with family.  No complaints of pain or discomfort.  IV and Tele d/ced.  Pt enc to return to the follow up HD appt.

## 2022-10-07 NOTE — Progress Notes (Signed)
POST HD TX NOTE  10/07/22 1256  Vitals  Temp 98.6 F (37 C)  Temp Source Oral  BP (!) 165/102  MAP (mmHg) 119  BP Location Right Arm  BP Method Automatic  Patient Position (if appropriate) Lying  Pulse Rate 89  Pulse Rate Source Monitor  ECG Heart Rate 83  Resp 11  Oxygen Therapy  SpO2 100 %  O2 Device Room Air  Pulse Oximetry Type Continuous  During Treatment Monitoring  Intra-Hemodialysis Comments (S)   (post HD tx VS check)  Post Treatment  Dialyzer Clearance Lightly streaked  Duration of HD Treatment -hour(s) 4 hour(s)  Hemodialysis Intake (mL) 0 mL  Liters Processed 96  Fluid Removed (mL) 2000 mL  Tolerated HD Treatment Yes  Post-Hemodialysis Comments (S)  tx completed w/o problem, UF goal met, blood rinsed back, VSS. Medication Admin: Zofran 4mg  IVPB, Heparin Dwells 3200 units  Hemodialysis Catheter Right Internal jugular Double lumen Permanent (Tunneled)  Placement Date/Time: 10/05/22 1429   Serial / Lot #: [id number]  Expiration Date: 05/18/27  Time Out: Correct patient;Correct site;Correct procedure  Maximum sterile barrier precautions: Hand hygiene;Cap;Mask;Sterile gown;Sterile gloves;Large sterile ...  Site Condition No complications  Blue Lumen Status Heparin locked;Dead end cap in place  Red Lumen Status Heparin locked;Dead end cap in place  Purple Lumen Status N/A  Catheter fill solution Heparin 1000 units/ml  Catheter fill volume (Arterial) 1.6 cc  Catheter fill volume (Venous) 1.6  Dressing Type Transparent  Dressing Status Antimicrobial disc in place;Clean, Dry, Intact  Drainage Description None  Dressing Change Due 10/14/22  Post treatment catheter status Capped and Clamped

## 2022-10-07 NOTE — Discharge Summary (Signed)
Physician Discharge Summary   Patient: [name] MRN: [id number] DOB: 27-Mar-1986  Admit date:     10/04/2022  Discharge date: 10/07/22  Discharge Physician: Oswald Hillock   PCP: Camillia Herter, NP   Recommendations at discharge:   Follow-up vascular surgery as outpatient in 1 week  Discharge Diagnoses: Principal Problem:   AV fistula occlusion (HCC) Active Problems:   Hypertensive urgency   ESRD (end stage renal disease) on dialysis (HCC)   Acute on chronic diastolic CHF (congestive heart failure) ([facility])  Resolved Problems:   * No resolved hospital problems. *  Hospital Course:  37 year old male with history of ESRD on HD TTS, hypertension, diabetes mellitus type 2, chronic HFpEF, secondary to hypertrophic cardiomyopathy, seizure disorder came with left arm pain and was unable to have hemodialysis.  Patient went to have HD via left arm AV fistula last Thursday but had discomfort with HD in the left arm but completed the whole session.  He went home and started to have nausea, left arm fistula site pain. Patient came to the ED and was found to have SBP elevated to more than 200 mg Hg.  Nephrology was consulted.  IR consulted for AV fistulogram and declotting.  Chest x-ray showed cardiomegaly and bilateral pulmonary vascular congestion.   Assessment and Plan:  Acute left arm AV fistula malfunction -IR consulted for AV fistulogram and possible declotting -Nephrology following -Vascular surgery consulted for declotting of the fistula -Noted to have nonocclusive thrombus that was unable to be removed. -Tunneled dialysis catheter was placed at that time. -Plan was for open thrombectomy today; however patient refused surgery today -He will follow-up with vascular surgery as outpatient in 1 week to discuss thrombectomy   Hypertensive urgency -Resolved -Continue Coreg, clonidine, labetalol as needed   Acute on chronic HFpEF -Improved with hemodialysis -Initially was started  on IV Lasix 100 mg every 12 hours -Got dialysis today -IV Lasix was discontinued   Diabetes mellitus type 2 -Continue home regimen          Consultants: Nephrology, vascular surgery Procedures performed:  Disposition: Home Diet recommendation:  Discharge Diet Orders (From admission, onward)     Start     Ordered   10/07/22 0000  Diet - low sodium heart healthy        10/07/22 1246           Carb modified diet DISCHARGE MEDICATION: Allergies as of 10/07/2022       Reactions   Amlodipine Nausea And Vomiting, Other (See Comments)   Patient was taking Amlodipine and Hydralazine at the same time, so the reactions came from one of the 2: Lethargy and an all-over feeling of NOT feeling well (also)   Hydralazine Nausea And Vomiting, Other (See Comments)   Patient was taking Hydralazine AND Amlodipine at the same time, so the reactions came from one of the 2: Lethargy and an all-over feeling of NOT feeling well (also)        Medication List     TAKE these medications    acetaminophen 325 MG tablet Commonly known as: TYLENOL Take 2 tablets (650 mg total) by mouth every 6 (six) hours as needed for mild pain (or Fever >/= 101).   atorvastatin 40 MG tablet Commonly known as: LIPITOR Take 40 mg by mouth daily.   calcitRIOL 0.5 MCG capsule Commonly known as: ROCALTROL Take 2 capsules (1 mcg total) by mouth Every Tuesday,Thursday,and Saturday with dialysis.   carvedilol 25 MG tablet Commonly known  as: COREG Take 1 tablet (25 mg total) by mouth 2 (two) times daily with a meal.   cloNIDine 0.1 MG tablet Commonly known as: CATAPRES Take 0.1 mg by mouth 2 (two) times daily.   insulin glargine-yfgn 100 UNIT/ML injection Commonly known as: SEMGLEE Inject 0.12 mLs (12 Units total) into the skin at bedtime.   insulin lispro 100 UNIT/ML injection Commonly known as: HUMALOG Inject 1-7 Units into the skin See admin instructions. Per sliding scale 3 times daily with  meals 151-200= 1 unit 201-250= 2 units 251-300= 3 units 301-350= 5 units 351-400= 7 units Greater than 400 call md takes only as needed if blood sugar is over 200   multivitamin Tabs tablet Take 1 tablet by mouth at bedtime.   Rena-Vite Rx 1 MG Tabs Take 1 tablet by mouth daily.   multivitamin with minerals tablet Take 1 tablet by mouth daily.   ondansetron 4 MG tablet Commonly known as: ZOFRAN Take 4 mg by mouth every 8 (eight) hours as needed for nausea or vomiting.   Pen Needles 31G X 8 MM Misc UAD   prednisoLONE acetate 1 % ophthalmic suspension Commonly known as: PRED FORTE Place 1 drop into the left eye See admin instructions. 2-3 times daily   sevelamer carbonate 800 MG tablet Commonly known as: RENVELA Take 2 tablets (1,600 mg total) by mouth 3 (three) times daily with meals.        Follow-up Information     [facility] Follow up in 1 week(s).   Why: Office will call to schedule outpatient surgery next week. If you have not heard from our office by Monday 10/11/22 please call to schedule Contact information: [address] [place] [phone]               Discharge Exam: Danley Danker Weights   10/06/22 0537 10/07/22 0758  Weight: 88.8 kg 89.3 kg   General-appears in no acute distress Heart-S1-S2, regular, no murmur auscultated Lungs-clear to auscultation bilaterally, no wheezing or crackles auscultated Abdomen-soft, nontender, no organomegaly Extremities-no edema in the lower extremities Neuro-alert, oriented x3, no focal deficit noted  Condition at discharge: good  The results of significant diagnostics from this hospitalization (including imaging, microbiology, ancillary and laboratory) are listed below for reference.   Imaging Studies: IR Declot Thromb Agent Implant Vasc Device/Cath  Result Date: 10/06/2022 INDICATION: Clotted left forearm AV fistula.  Please attempted declot procedure. EXAM: 1.  FISTULALYSIS 2. ANGIOPLASTY OF VENOUS LIMB AND VENOUS ANASTOMOSIS 3. ULTRASOUND GUIDANCE FOR VENOUS ACCESS 4. ULTRASOUND AND FLUOROSCOPIC GUIDED PLACEMENT OF RIGHT INTERNAL JUGULAR APPROACH HEMODIALYSIS CATHETER. COMPARISON:  Image guided placement of temporary hemodialysis catheter-10/04/2022 MEDICATIONS: Heparin 4000 units IV; TPA 4 mg into the AV fistula. CONTRAST:  40 cc Omnipaque 300 ANESTHESIA/SEDATION: Moderate (conscious) sedation was employed during this procedure. A total of Versed 4 mg and Fentanyl 200 mcg was administered intravenously. Moderate Sedation Time: 107 minutes. The patient's level of consciousness and vital signs were monitored continuously by radiology nursing throughout the procedure under my direct supervision. FLUOROSCOPY TIME:  19 minutes (35 mGy) COMPLICATIONS: None immediate. TECHNIQUE: Informed written consent was obtained from the patient after a discussion of the risks, benefits and alternatives to treatment. Questions regarding the procedure were encouraged and answered. A timeout was performed prior to the initiation of the procedure. 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 The left forearm was prepped and draped in usual sterile fashion. Maximum barrier sterile technique with sterile gowns and gloves were used for the procedure. Under ultrasound guidance, the AV fistula was accessed at the arterial limb directed towards the venous outflow allowing for placement of a 7 French vascular sheath. Despite the use of a Kumpe catheter, there is great difficulty advancing the glidewire beyond the peripheral most aneurism of the AV fistula As such, the occluded draining venous limb was accessed directed towards the anastomosis under direct ultrasound guidance allowing for placement of a  micropuncture sheath. Next, stiff glidewire was advanced to the level of the initial 7 [place] vascular sheath access. An ensnare was utilized to grasp the stiff glidewire which was then pulled through the aneurysm to the level of the venous outflow. Limited contrast injection confirmed appropriate positioning. A left upper extremity central venogram was performed. Next, the occluded segment of the AV fistula was laced with 4 mg of tPA via Kumpe catheter. Next, thrombosed segment of the AV fistula was angioplastied at multiple stations with a 6 mm x 4 cm Conquest balloon. Mechanical thrombectomy was then performed with the use of both on Angiojet device as well as a Fogarty occlusion balloon. Initial images demonstrate restored patency of the AV fistula with residual near occlusive mural thrombus. As such, the previously thrombosed segment of the AV fistula were angioplastied with a 7 mm x 4 cm Conquest balloon and several additional rounds of mechanical thrombectomy was performed both with the Angiojet device as well as the Fogarty occlusion balloon. Completion images were obtained and while there was restored flow to the AV fistula, there was a significant amount of nonocclusive mural thrombus which appeared refractory to continued intervention. As such the decision was made to either confirmed or placed a new tunneled hemodialysis catheter to ensure durable dialysis access. At this point, the procedure was terminated. All wires, catheters and sheaths were removed from the patient. Hemostasis was achieved at the access site with deployment of a swizzle sutures which will be removed at the patient's next dialysis session. Dressings were placed. _________________________________________________________ Attention was now paid towards placement of a tunneled hemodialysis catheter. The right neck, chest and external portion of the pre-existing right internal jugular approach temporary hemodialysis catheter was prepped  and draped in usual sterile fashion. As the temporary hemodialysis catheter puncture site was deemed to be too high the right internal jugular vein was accessed at a more caudal location allowing advancement of a microwire to the level of the superior cavoatrial junction. The microwire was utilized for measurement purposes and a stiff glidewire was advanced to the level of the IVC. Next, a 19 cm tip to cuff palindrome hemodialysis catheter was tunneled from a entrance site on right anterior upper chest to the venotomy site. The pre-existing temporary hemodialysis catheter was removed intact. Peel-away sheath was placed and the dialysis catheter was inserted with tip ultimately terminating within the superior aspect of the right atrium. Catheter was noted to easily aspirate and flush. Postprocedural chest radiograph was negative for pneumothorax. Hemodialysis catheter was flushed with a appropriate heparin dwell. The patient tolerated the procedure without immediate postprocedural complication. FINDINGS: The existing left forearm radial-cephalic AV fistula is patent from the level of the anastomosis to its arterial stick zone however thrombosed centrally from that location to the distal humerus. There was great difficulty traversing the initial stick zone aneurism ultimately  requiring a snare technique to allow access to the central venous system of the left upper extremity. The cephalic vein restored patency at the level of the mid humerus and is patent to the level of the cephalic arch. The central venous system of the left upper extremity is widely patent to the level of the superior cavoatrial junction The AV fistula was successfully recannulated following pharmacological and mechanical thrombectomy interventions as detailed above including angioplasty of the thrombosed segments ultimately to 7 mm diameter. Completion images demonstrate restored patency to the fistula though there is a large amount of irregular  nonocclusive mural thrombus which was refractory to continued intervention. As such the decision was made to place a new tunneled hemodialysis catheter to ensure durable intravenous access. Note, the recently placed temporary dialysis catheter was deemed too high and access site to allow for conversion. After successful ultrasound and fluoroscopic guided placement, a 19 cm tip to cuff tunneled hemodialysis catheter is appropriately positioned with end terminating within the superior aspect of the right atrium. IMPRESSION: 1. Technically successful left forearm radial-cephalic thrombolysis though with a moderate-to-large amount of residual nonocclusive thrombus which was refractory to continued percutaneous intervention. Given my concern for the lack of durability of this attempted declot, I placed a tunneled right internal jugular approach dialysis catheter to ensure durable dialysis access. 2. The draining left cephalic vein is patent from the level of the mid humerus to the level of the cephalic arch. 3. The central venous system of the right upper extremity is widely patent. ACCESS: - Recommend attempted access of the patient's left forearm AV fistula. - If three successful dialysis sessions are achieved via the fistula, the tunneled hemodialysis catheter may be removed as indicated. Electronically Signed   By: Sandi Mariscal M.D.   On: 10/06/2022 18:31   IR Fluoro Guide CV Line Right  Result Date: 10/06/2022 INDICATION: Clotted left forearm AV fistula.  Please attempted declot procedure. EXAM: 1. FISTULALYSIS 2. ANGIOPLASTY OF VENOUS LIMB AND VENOUS ANASTOMOSIS 3. ULTRASOUND GUIDANCE FOR VENOUS ACCESS 4. ULTRASOUND AND FLUOROSCOPIC GUIDED PLACEMENT OF RIGHT INTERNAL JUGULAR APPROACH HEMODIALYSIS CATHETER. COMPARISON:  Image guided placement of temporary hemodialysis catheter-10/04/2022 MEDICATIONS: Heparin 4000 units IV; TPA 4 mg into the AV fistula. CONTRAST:  40 cc Omnipaque 300 ANESTHESIA/SEDATION: Moderate  (conscious) sedation was employed during this procedure. A total of Versed 4 mg and Fentanyl 200 mcg was administered intravenously. Moderate Sedation Time: 107 minutes. The patient's level of consciousness and vital signs were monitored continuously by radiology nursing throughout the procedure under my direct supervision. FLUOROSCOPY TIME:  19 minutes (35 mGy) COMPLICATIONS: None immediate. TECHNIQUE: Informed written consent was obtained from the patient after a discussion of the risks, benefits and alternatives to treatment. Questions regarding the procedure were encouraged and answered. A timeout was performed prior to the initiation of the procedure. 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 The left forearm was prepped and draped in usual sterile fashion. Maximum barrier sterile technique with sterile gowns and gloves were used for the procedure. Under ultrasound guidance, the AV fistula was accessed at the arterial limb directed towards the venous outflow allowing for placement of a 7 French vascular sheath. Despite the use of a Kumpe catheter, there is great difficulty advancing the glidewire beyond the peripheral most aneurism of the  AV fistula As such, the occluded draining venous limb was accessed directed towards the anastomosis under direct ultrasound guidance allowing for placement of a micropuncture sheath. Next, stiff glidewire was advanced to the level of the initial 7 [place] vascular sheath access. An ensnare was utilized to grasp the stiff glidewire which was then pulled through the aneurysm to the level of the venous outflow. Limited contrast injection confirmed appropriate positioning. A left upper extremity central venogram was performed. Next, the occluded segment of the AV fistula was laced with 4 mg  of tPA via Kumpe catheter. Next, thrombosed segment of the AV fistula was angioplastied at multiple stations with a 6 mm x 4 cm Conquest balloon. Mechanical thrombectomy was then performed with the use of both on Angiojet device as well as a Fogarty occlusion balloon. Initial images demonstrate restored patency of the AV fistula with residual near occlusive mural thrombus. As such, the previously thrombosed segment of the AV fistula were angioplastied with a 7 mm x 4 cm Conquest balloon and several additional rounds of mechanical thrombectomy was performed both with the Angiojet device as well as the Fogarty occlusion balloon. Completion images were obtained and while there was restored flow to the AV fistula, there was a significant amount of nonocclusive mural thrombus which appeared refractory to continued intervention. As such the decision was made to either confirmed or placed a new tunneled hemodialysis catheter to ensure durable dialysis access. At this point, the procedure was terminated. All wires, catheters and sheaths were removed from the patient. Hemostasis was achieved at the access site with deployment of a swizzle sutures which will be removed at the patient's next dialysis session. Dressings were placed. _________________________________________________________ Attention was now paid towards placement of a tunneled hemodialysis catheter. The right neck, chest and external portion of the pre-existing right internal jugular approach temporary hemodialysis catheter was prepped and draped in usual sterile fashion. As the temporary hemodialysis catheter puncture site was deemed to be too high the right internal jugular vein was accessed at a more caudal location allowing advancement of a microwire to the level of the superior cavoatrial junction. The microwire was utilized for measurement purposes and a stiff glidewire was advanced to the level of the IVC. Next, a 19 cm tip to cuff palindrome  hemodialysis catheter was tunneled from a entrance site on right anterior upper chest to the venotomy site. The pre-existing temporary hemodialysis catheter was removed intact. Peel-away sheath was placed and the dialysis catheter was inserted with tip ultimately terminating within the superior aspect of the right atrium. Catheter was noted to easily aspirate and flush. Postprocedural chest radiograph was negative for pneumothorax. Hemodialysis catheter was flushed with a appropriate heparin dwell. The patient tolerated the procedure without immediate postprocedural complication. FINDINGS: The existing left forearm radial-cephalic AV fistula is patent from the level of the anastomosis to its arterial stick zone however thrombosed centrally from that location to the distal humerus. 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 The cephalic vein restored patency at the level of the mid humerus and is patent to the level of the cephalic arch. The central venous system of the left upper extremity is widely patent to the level of the superior cavoatrial junction The AV fistula was successfully recannulated following pharmacological and mechanical thrombectomy interventions as detailed above including angioplasty of the thrombosed segments ultimately to 7 mm diameter. Completion  images demonstrate restored patency to the fistula though there is a large amount of irregular nonocclusive mural thrombus which was refractory to continued intervention. As such the decision was made to place a new tunneled hemodialysis catheter to ensure durable intravenous access. Note, the recently placed temporary dialysis catheter was deemed too high and access site to allow for conversion. After successful ultrasound and fluoroscopic guided placement, a 19 cm tip to cuff tunneled hemodialysis catheter is  appropriately positioned with end terminating within the superior aspect of the right atrium. IMPRESSION: 1. Technically successful left forearm radial-cephalic thrombolysis though with a moderate-to-large amount of residual nonocclusive thrombus which was refractory to continued percutaneous intervention. Given my concern for the lack of durability of this attempted declot, I placed a tunneled right internal jugular approach dialysis catheter to ensure durable dialysis access. 2. The draining left cephalic vein is patent from the level of the mid humerus to the level of the cephalic arch. 3. The central venous system of the right upper extremity is widely patent. ACCESS: - Recommend attempted access of the patient's left forearm AV fistula. - If three successful dialysis sessions are achieved via the fistula, the tunneled hemodialysis catheter may be removed as indicated. Electronically Signed   By: Sandi Mariscal M.D.   On: 10/06/2022 18:31   IR US Guide Vasc Access Right  Result Date: 10/06/2022 INDICATION: Clotted left forearm AV fistula.  Please attempted declot procedure. EXAM: 1. FISTULALYSIS 2. ANGIOPLASTY OF VENOUS LIMB AND VENOUS ANASTOMOSIS 3. ULTRASOUND GUIDANCE FOR VENOUS ACCESS 4. ULTRASOUND AND FLUOROSCOPIC GUIDED PLACEMENT OF RIGHT INTERNAL JUGULAR APPROACH HEMODIALYSIS CATHETER. COMPARISON:  Image guided placement of temporary hemodialysis catheter-10/04/2022 MEDICATIONS: Heparin 4000 units IV; TPA 4 mg into the AV fistula. CONTRAST:  40 cc Omnipaque 300 ANESTHESIA/SEDATION: Moderate (conscious) sedation was employed during this procedure. A total of Versed 4 mg and Fentanyl 200 mcg was administered intravenously. Moderate Sedation Time: 107 minutes. The patient's level of consciousness and vital signs were monitored continuously by radiology nursing throughout the procedure under my direct supervision. FLUOROSCOPY TIME:  19 minutes (35 mGy) COMPLICATIONS: None immediate. TECHNIQUE: Informed  written consent was obtained from the patient after a discussion of the risks, benefits and alternatives to treatment. Questions regarding the procedure were encouraged and answered. A timeout was performed prior to the initiation of the procedure. 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 The left forearm was prepped and draped in usual sterile fashion. Maximum barrier sterile technique with sterile gowns and gloves were used for the procedure. Under ultrasound guidance, the AV fistula was accessed at the arterial limb directed towards the venous outflow allowing for placement of a 7 French vascular sheath. Despite the use of a Kumpe catheter, there is great difficulty advancing the glidewire beyond the peripheral most aneurism of the AV fistula As such, the occluded draining venous limb was accessed directed towards the anastomosis under direct ultrasound guidance allowing for placement of a micropuncture sheath. Next, stiff glidewire was advanced to the level of the initial 7 [place] vascular sheath access. An ensnare was utilized to grasp the stiff glidewire which was then pulled through the aneurysm to the level of the venous outflow. Limited contrast injection confirmed appropriate positioning. A left upper extremity central venogram was performed. Next, the occluded segment of the AV  fistula was laced with 4 mg of tPA via Kumpe catheter. Next, thrombosed segment of the AV fistula was angioplastied at multiple stations with a 6 mm x 4 cm Conquest balloon. Mechanical thrombectomy was then performed with the use of both on Angiojet device as well as a Fogarty occlusion balloon. Initial images demonstrate restored patency of the AV fistula with residual near occlusive mural thrombus. As such, the previously thrombosed  segment of the AV fistula were angioplastied with a 7 mm x 4 cm Conquest balloon and several additional rounds of mechanical thrombectomy was performed both with the Angiojet device as well as the Fogarty occlusion balloon. Completion images were obtained and while there was restored flow to the AV fistula, there was a significant amount of nonocclusive mural thrombus which appeared refractory to continued intervention. As such the decision was made to either confirmed or placed a new tunneled hemodialysis catheter to ensure durable dialysis access. At this point, the procedure was terminated. All wires, catheters and sheaths were removed from the patient. Hemostasis was achieved at the access site with deployment of a swizzle sutures which will be removed at the patient's next dialysis session. Dressings were placed. _________________________________________________________ Attention was now paid towards placement of a tunneled hemodialysis catheter. The right neck, chest and external portion of the pre-existing right internal jugular approach temporary hemodialysis catheter was prepped and draped in usual sterile fashion. As the temporary hemodialysis catheter puncture site was deemed to be too high the right internal jugular vein was accessed at a more caudal location allowing advancement of a microwire to the level of the superior cavoatrial junction. The microwire was utilized for measurement purposes and a stiff glidewire was advanced to the level of the IVC. Next, a 19 cm tip to cuff palindrome hemodialysis catheter was tunneled from a entrance site on right anterior upper chest to the venotomy site. The pre-existing temporary hemodialysis catheter was removed intact. Peel-away sheath was placed and the dialysis catheter was inserted with tip ultimately terminating within the superior aspect of the right atrium. Catheter was noted to easily aspirate and flush. Postprocedural chest radiograph was negative for  pneumothorax. Hemodialysis catheter was flushed with a appropriate heparin dwell. The patient tolerated the procedure without immediate postprocedural complication. FINDINGS: The existing left forearm radial-cephalic AV fistula is patent from the level of the anastomosis to its arterial stick zone however thrombosed centrally from that location to the distal humerus. 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 The cephalic vein restored patency at the level of the mid humerus and is patent to the level of the cephalic arch. The central venous system of the left upper extremity is widely patent to the level of the superior cavoatrial junction The AV fistula was successfully recannulated following pharmacological and mechanical thrombectomy interventions as detailed above including angioplasty of the thrombosed segments ultimately to 7 mm diameter. Completion images demonstrate restored patency to the fistula though there is a large amount of irregular nonocclusive mural thrombus which was refractory to continued intervention. As such the decision was made to place a new tunneled hemodialysis catheter to ensure durable intravenous access. Note, the recently placed temporary dialysis catheter was deemed too high and access site to allow for conversion. After successful ultrasound and fluoroscopic guided placement, a 19 cm tip to cuff tunneled hemodialysis catheter is appropriately positioned with end terminating within the superior aspect  of the right atrium. IMPRESSION: 1. Technically successful left forearm radial-cephalic thrombolysis though with a moderate-to-large amount of residual nonocclusive thrombus which was refractory to continued percutaneous intervention. Given my concern for the lack of durability of this attempted declot, I placed a tunneled right internal jugular  approach dialysis catheter to ensure durable dialysis access. 2. The draining left cephalic vein is patent from the level of the mid humerus to the level of the cephalic arch. 3. The central venous system of the right upper extremity is widely patent. ACCESS: - Recommend attempted access of the patient's left forearm AV fistula. - If three successful dialysis sessions are achieved via the fistula, the tunneled hemodialysis catheter may be removed as indicated. Electronically Signed   By: Sandi Mariscal M.D.   On: 10/06/2022 18:31   IR Fluoro Guide CV Line Right  Result Date: 10/04/2022 CLINICAL DATA:  Occluded dialysis fistula and need for temporary dialysis catheter for immediate dialysis needs. EXAM: TUNNELED CENTRAL VENOUS HEMODIALYSIS CATHETER PLACEMENT WITH ULTRASOUND AND FLUOROSCOPIC GUIDANCE ANESTHESIA/SEDATION: None MEDICATIONS: None FLUOROSCOPY: 30 seconds.  12.0 mGy. PROCEDURE: The procedure, risks, benefits, and alternatives were explained to the patient. Questions regarding the procedure were encouraged and answered. The patient understands and consents to the procedure. A timeout was performed prior to initiating the procedure. The right neck and chest were prepped with chlorhexidine in a sterile fashion, and a sterile drape was applied covering the operative field. Maximum barrier sterile technique with sterile gowns and gloves were used for the procedure. Local anesthesia was provided with 1% lidocaine. Ultrasound was used to confirm patency of the right internal jugular vein. A permanent ultrasound image was saved and recorded. After creating a small venotomy incision, a 21 gauge needle was advanced into the right internal jugular vein under direct, real-time ultrasound guidance. Ultrasound image documentation was performed. After securing guidewire access the venotomy was dilated over the wire. A 16 cm, 13 French, triple lumen Mahurkar non tunneled hemodialysis catheter was chosen for placement.  The catheter was then placed over the guidewire. Final catheter positioning was confirmed and documented with a fluoroscopic spot image. The catheter was aspirated, flushed with saline, and injected with appropriate volume heparin dwells. The catheter exit site was secured with 0-Prolene retention sutures. COMPLICATIONS: None.  No pneumothorax. FINDINGS: After catheter placement, the tip lies at the SVC/RA junction. The catheter aspirates normally and is ready for immediate use. IMPRESSION: Placement of non tunneled hemodialysis catheter via the right internal jugular vein. The catheter tip lies at the SVC/RA junction. The catheter is ready for immediate use. Electronically Signed   By: Aletta Edouard M.D.   On: 10/04/2022 17:01   IR US Guide Vasc Access Right  Result Date: 10/04/2022 CLINICAL DATA:  Occluded dialysis fistula and need for temporary dialysis catheter for immediate dialysis needs. EXAM: TUNNELED CENTRAL VENOUS HEMODIALYSIS CATHETER PLACEMENT WITH ULTRASOUND AND FLUOROSCOPIC GUIDANCE ANESTHESIA/SEDATION: None MEDICATIONS: None FLUOROSCOPY: 30 seconds.  12.0 mGy. PROCEDURE: The procedure, risks, benefits, and alternatives were explained to the patient. Questions regarding the procedure were encouraged and answered. The patient understands and consents to the procedure. A timeout was performed prior to initiating the procedure. The right neck and chest were prepped with chlorhexidine in a sterile fashion, and a sterile drape was applied covering the operative field. Maximum barrier sterile technique with sterile gowns and gloves were used for the procedure. Local anesthesia was provided with 1% lidocaine. Ultrasound was used to confirm patency of the right internal jugular vein. A  permanent ultrasound image was saved and recorded. After creating a small venotomy incision, a 21 gauge needle was advanced into the right internal jugular vein under direct, real-time ultrasound guidance. Ultrasound image  documentation was performed. After securing guidewire access the venotomy was dilated over the wire. A 16 cm, 13 French, triple lumen Mahurkar non tunneled hemodialysis catheter was chosen for placement. The catheter was then placed over the guidewire. Final catheter positioning was confirmed and documented with a fluoroscopic spot image. The catheter was aspirated, flushed with saline, and injected with appropriate volume heparin dwells. The catheter exit site was secured with 0-Prolene retention sutures. COMPLICATIONS: None.  No pneumothorax. FINDINGS: After catheter placement, the tip lies at the SVC/RA junction. The catheter aspirates normally and is ready for immediate use. IMPRESSION: Placement of non tunneled hemodialysis catheter via the right internal jugular vein. The catheter tip lies at the SVC/RA junction. The catheter is ready for immediate use. Electronically Signed   By: Aletta Edouard M.D.   On: 10/04/2022 17:01   DG Chest 2 View  Result Date: 10/04/2022 CLINICAL DATA:  Shortness of breath, not able to complete dialysis due to fistula EXAM: CHEST - 2 VIEW COMPARISON:  08/13/2022 FINDINGS: Cardiomegaly. Diffuse bilateral interstitial opacity. The visualized skeletal structures are unremarkable. IMPRESSION: Cardiomegaly with diffuse bilateral interstitial opacity, likely edema. No focal airspace opacity. Electronically Signed   By: Delanna Ahmadi M.D.   On: 10/04/2022 10:39    Microbiology: Results for orders placed or performed during the hospital encounter of 10/04/22  SARS Coronavirus 2 by RT PCR (hospital order, performed in [facility] hospital lab) *cepheid single result test* Anterior Nasal Swab     Status: None   Collection Time: 10/04/22 11:30 AM   Specimen: Anterior Nasal Swab  Result Value Ref Range Status   SARS Coronavirus 2 by RT PCR NEGATIVE NEGATIVE Final    Comment: (NOTE) SARS-CoV-2 target nucleic acids are NOT DETECTED.  The SARS-CoV-2 RNA is generally detectable in  upper and lower respiratory specimens during the acute phase of infection. The lowest concentration of SARS-CoV-2 viral copies this assay can detect is 250 copies / mL. A negative result does not preclude SARS-CoV-2 infection and should not be used as the sole basis for treatment or other patient management decisions.  A negative result may occur with improper specimen collection / handling, submission of specimen other than nasopharyngeal swab, presence of viral mutation(s) within the areas targeted by this assay, and inadequate number of viral copies (<250 copies / mL). A negative result must be combined with clinical observations, patient history, and epidemiological information.  Fact Sheet for Patients:   https://www.patel.info/  Fact Sheet for Healthcare Providers: https://hall.com/  This test is not yet approved or  cleared by the [place] FDA and has been authorized for detection and/or diagnosis of SARS-CoV-2 by FDA under an Emergency Use Authorization (EUA).  This EUA will remain in effect (meaning this test can be used) for the duration of the COVID-19 declaration under Section 564(b)(1) of the Act, 21 U.S.C. section 360bbb-3(b)(1), unless the authorization is terminated or revoked sooner.  Performed at [facility], [place] [address], [place], [place] [address]     Labs: CBC: Recent Labs  Lab 10/04/22 1015 10/04/22 1025 10/07/22 0832  WBC 8.1  --  7.0  NEUTROABS 6.3  --   --   HGB 10.6* 12.6* 9.9*  HCT 34.0* 37.0* 30.8*  MCV 82.7  --  81.1  PLT 215  --  123456   Basic Metabolic Panel: Recent Labs  Lab 10/04/22 1758 10/05/22 0403 10/05/22 1608 10/06/22 0228 10/07/22 0357  NA 132* 132* 134* 133* 131*  K 5.0 4.2 4.0 4.3 5.5*  CL 95* 92* 95* 93* 91*  CO2 19* 27 26 27 25   GLUCOSE 97 141* 129* 109* 130*  BUN 94* 46* 30* 37* 69*  CREATININE 16.95* 9.91* 7.33* 8.93* 12.08*  CALCIUM 7.5* 8.0*  8.1* 8.0* 7.8*   Liver Function Tests: No results for input(s): "AST", "ALT", "ALKPHOS", "BILITOT", "PROT", "ALBUMIN" in the last 168 hours. CBG: Recent Labs  Lab 10/06/22 0733 10/06/22 1142 10/06/22 1535 10/06/22 2036 10/07/22 0741  GLUCAP 112* 120* 110* 115* 115*    Discharge time spent: greater than 30 minutes.  Signed: Oswald Hillock, MD [facility] 10/07/2022

## 2022-10-07 NOTE — Procedures (Signed)
I was present at this dialysis session. I have reviewed the session itself and made appropriate changes.   Filed Weights   10/06/22 0537 10/07/22 0758  Weight: 88.8 kg 89.3 kg    Recent Labs  Lab 10/07/22 0357  NA 131*  K 5.5*  CL 91*  CO2 25  GLUCOSE 130*  BUN 69*  CREATININE 12.08*  CALCIUM 7.8*    Recent Labs  Lab 10/04/22 1015 10/04/22 1025 10/07/22 0832  WBC 8.1  --  7.0  NEUTROABS 6.3  --   --   HGB 10.6* 12.6* 9.9*  HCT 34.0* 37.0* 30.8*  MCV 82.7  --  81.1  PLT 215  --  203    Scheduled Meds:  atorvastatin  40 mg Oral QHS   calcitRIOL  1 mcg Oral Q T,Th,Sa-HD   carvedilol  25 mg Oral BID WC   Chlorhexidine Gluconate Cloth  6 each Topical Q0600   Chlorhexidine Gluconate Cloth  6 each Topical Q0600   cloNIDine  0.1 mg Oral BID   gabapentin  100 mg Oral QHS   insulin aspart  0-6 Units Subcutaneous TID WC   prednisoLONE acetate  1 drop Left Eye Q8H   sevelamer carbonate  1,600 mg Oral TID WC   sodium chloride flush  3 mL Intravenous Q12H   Continuous Infusions:  sodium chloride     PRN Meds:.sodium chloride, acetaminophen, HYDROmorphone (DILAUDID) injection, labetalol, ondansetron (ZOFRAN) IV, sodium chloride flush   Santiago Bumpers,  MD 10/07/2022, 9:33 AM

## 2022-10-07 NOTE — Telephone Encounter (Signed)
Spoke to pt to schedule his surgery. He is trying to decide which day next week he wants and stated he will call us back once he "figures things out".

## 2022-10-07 NOTE — Progress Notes (Signed)
[facility] Progress Note   Subjective:   Pt seen on HD, using TDC today. Denies SOB, CP, dizziness and nausea. BP elevated, antihypertensives were held pre-HD  Objective Vitals:   10/07/22 0758 10/07/22 0813 10/07/22 0830 10/07/22 0902  BP: (!) 180/103 (!) 166/114 (!) 171/109 (!) 163/110  Pulse: 73 68 68 71  Resp: 15 18 19 19   Temp: 98.2 F (36.8 C)     TempSrc: Oral     SpO2: 100% 100% 99% 99%  Weight: 89.3 kg     Height:       Physical Exam General: Alert male in NAD Heart: RRR, no murmurs, rubs or gallops Lungs: CTA bilaterally Abdomen: Soft, non-distended, +BS Extremities: No edema b/l lower extermities Dialysis Access:  AVF + bruit, aneurysmal.  [facility] accessed  Additional Objective Labs: Basic Metabolic Panel: Recent Labs  Lab 10/05/22 1608 10/06/22 0228 10/07/22 0357  NA 134* 133* 131*  K 4.0 4.3 5.5*  CL 95* 93* 91*  CO2 26 27 25   GLUCOSE 129* 109* 130*  BUN 30* 37* 69*  CREATININE 7.33* 8.93* 12.08*  CALCIUM 8.1* 8.0* 7.8*   CBC: Recent Labs  Lab 10/04/22 1015 10/04/22 1025 10/07/22 0832  WBC 8.1  --  7.0  NEUTROABS 6.3  --   --   HGB 10.6* 12.6* 9.9*  HCT 34.0* 37.0* 30.8*  MCV 82.7  --  81.1  PLT 215  --  203   Blood Culture    Component Value Date/Time   SDES WOUND 12/29/2021 1910   SPECREQUEST RIGHT FIFTH TOE TEST VANCO CEFEPIME 12/29/2021 1910   CULT  12/29/2021 1910    RARE STAPHYLOCOCCUS AUREUS NO ANAEROBES ISOLATED Performed at [facility], [address], [place], [place] [address]    REPTSTATUS 01/03/2022 FINAL 12/29/2021 1910    Cardiac Enzymes: No results for input(s): "CKTOTAL", "CKMB", "CKMBINDEX", "TROPONINI" in the last 168 hours. CBG: Recent Labs  Lab 10/06/22 0733 10/06/22 1142 10/06/22 1535 10/06/22 2036 10/07/22 0741  GLUCAP 112* 120* 110* 115* 115*   Iron Studies: No results for input(s): "IRON", "TIBC", "TRANSFERRIN", "FERRITIN" in the last 72  hours. @lablastinr3 @ Studies/Results: IR Declot Thromb Agent Implant Vasc Device/Cath  Result Date: 10/06/2022 INDICATION: Clotted left forearm AV fistula.  Please attempted declot procedure. EXAM: 1. FISTULALYSIS 2. ANGIOPLASTY OF VENOUS LIMB AND VENOUS ANASTOMOSIS 3. ULTRASOUND GUIDANCE FOR VENOUS ACCESS 4. ULTRASOUND AND FLUOROSCOPIC GUIDED PLACEMENT OF RIGHT INTERNAL JUGULAR APPROACH HEMODIALYSIS CATHETER. COMPARISON:  Image guided placement of temporary hemodialysis catheter-10/04/2022 MEDICATIONS: Heparin 4000 units IV; TPA 4 mg into the AV fistula. CONTRAST:  40 cc Omnipaque 300 ANESTHESIA/SEDATION: Moderate (conscious) sedation was employed during this procedure. A total of Versed 4 mg and Fentanyl 200 mcg was administered intravenously. Moderate Sedation Time: 107 minutes. The patient's level of consciousness and vital signs were monitored continuously by radiology nursing throughout the procedure under my direct supervision. FLUOROSCOPY TIME:  19 minutes (35 mGy) COMPLICATIONS: None immediate. TECHNIQUE: Informed written consent was obtained from the patient after a discussion of the risks, benefits and alternatives to treatment. Questions regarding the procedure were encouraged and answered. A timeout was performed prior to the initiation of the procedure. 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 The left forearm was prepped and draped in usual sterile fashion. Maximum  barrier sterile technique with sterile gowns and gloves were used for the procedure. Under ultrasound guidance, the AV fistula was accessed at the arterial limb directed towards the venous outflow allowing for placement of a 7 French vascular sheath. Despite the use of a Kumpe catheter, there is great difficulty advancing the glidewire beyond  the peripheral most aneurism of the AV fistula As such, the occluded draining venous limb was accessed directed towards the anastomosis under direct ultrasound guidance allowing for placement of a micropuncture sheath. Next, stiff glidewire was advanced to the level of the initial 7 [place] vascular sheath access. An ensnare was utilized to grasp the stiff glidewire which was then pulled through the aneurysm to the level of the venous outflow. Limited contrast injection confirmed appropriate positioning. A left upper extremity central venogram was performed. Next, the occluded segment of the AV fistula was laced with 4 mg of tPA via Kumpe catheter. Next, thrombosed segment of the AV fistula was angioplastied at multiple stations with a 6 mm x 4 cm Conquest balloon. Mechanical thrombectomy was then performed with the use of both on Angiojet device as well as a Fogarty occlusion balloon. Initial images demonstrate restored patency of the AV fistula with residual near occlusive mural thrombus. As such, the previously thrombosed segment of the AV fistula were angioplastied with a 7 mm x 4 cm Conquest balloon and several additional rounds of mechanical thrombectomy was performed both with the Angiojet device as well as the Fogarty occlusion balloon. Completion images were obtained and while there was restored flow to the AV fistula, there was a significant amount of nonocclusive mural thrombus which appeared refractory to continued intervention. As such the decision was made to either confirmed or placed a new tunneled hemodialysis catheter to ensure durable dialysis access. At this point, the procedure was terminated. All wires, catheters and sheaths were removed from the patient. Hemostasis was achieved at the access site with deployment of a swizzle sutures which will be removed at the patient's next dialysis session. Dressings were placed. _________________________________________________________ Attention was now  paid towards placement of a tunneled hemodialysis catheter. The right neck, chest and external portion of the pre-existing right internal jugular approach temporary hemodialysis catheter was prepped and draped in usual sterile fashion. As the temporary hemodialysis catheter puncture site was deemed to be too high the right internal jugular vein was accessed at a more caudal location allowing advancement of a microwire to the level of the superior cavoatrial junction. The microwire was utilized for measurement purposes and a stiff glidewire was advanced to the level of the IVC. Next, a 19 cm tip to cuff palindrome hemodialysis catheter was tunneled from a entrance site on right anterior upper chest to the venotomy site. The pre-existing temporary hemodialysis catheter was removed intact. Peel-away sheath was placed and the dialysis catheter was inserted with tip ultimately terminating within the superior aspect of the right atrium. Catheter was noted to easily aspirate and flush. Postprocedural chest radiograph was negative for pneumothorax. Hemodialysis catheter was flushed with a appropriate heparin dwell. The patient tolerated the procedure without immediate postprocedural complication. FINDINGS: The existing left forearm radial-cephalic AV fistula is patent from the level of the anastomosis to its arterial stick zone however thrombosed centrally from that location to the distal humerus. 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 The cephalic vein restored patency at the level of  the mid humerus and is patent to the level of the cephalic arch. The central venous system of the left upper extremity is widely patent to the level of the superior cavoatrial junction The AV fistula was successfully recannulated following pharmacological and mechanical thrombectomy interventions as  detailed above including angioplasty of the thrombosed segments ultimately to 7 mm diameter. Completion images demonstrate restored patency to the fistula though there is a large amount of irregular nonocclusive mural thrombus which was refractory to continued intervention. As such the decision was made to place a new tunneled hemodialysis catheter to ensure durable intravenous access. Note, the recently placed temporary dialysis catheter was deemed too high and access site to allow for conversion. After successful ultrasound and fluoroscopic guided placement, a 19 cm tip to cuff tunneled hemodialysis catheter is appropriately positioned with end terminating within the superior aspect of the right atrium. IMPRESSION: 1. Technically successful left forearm radial-cephalic thrombolysis though with a moderate-to-large amount of residual nonocclusive thrombus which was refractory to continued percutaneous intervention. Given my concern for the lack of durability of this attempted declot, I placed a tunneled right internal jugular approach dialysis catheter to ensure durable dialysis access. 2. The draining left cephalic vein is patent from the level of the mid humerus to the level of the cephalic arch. 3. The central venous system of the right upper extremity is widely patent. ACCESS: - Recommend attempted access of the patient's left forearm AV fistula. - If three successful dialysis sessions are achieved via the fistula, the tunneled hemodialysis catheter may be removed as indicated. Electronically Signed   By: Sandi Mariscal M.D.   On: 10/06/2022 18:31   IR Fluoro Guide CV Line Right  Result Date: 10/06/2022 INDICATION: Clotted left forearm AV fistula.  Please attempted declot procedure. EXAM: 1. FISTULALYSIS 2. ANGIOPLASTY OF VENOUS LIMB AND VENOUS ANASTOMOSIS 3. ULTRASOUND GUIDANCE FOR VENOUS ACCESS 4. ULTRASOUND AND FLUOROSCOPIC GUIDED PLACEMENT OF RIGHT INTERNAL JUGULAR APPROACH HEMODIALYSIS CATHETER.  COMPARISON:  Image guided placement of temporary hemodialysis catheter-10/04/2022 MEDICATIONS: Heparin 4000 units IV; TPA 4 mg into the AV fistula. CONTRAST:  40 cc Omnipaque 300 ANESTHESIA/SEDATION: Moderate (conscious) sedation was employed during this procedure. A total of Versed 4 mg and Fentanyl 200 mcg was administered intravenously. Moderate Sedation Time: 107 minutes. The patient's level of consciousness and vital signs were monitored continuously by radiology nursing throughout the procedure under my direct supervision. FLUOROSCOPY TIME:  19 minutes (35 mGy) COMPLICATIONS: None immediate. TECHNIQUE: Informed written consent was obtained from the patient after a discussion of the risks, benefits and alternatives to treatment. Questions regarding the procedure were encouraged and answered. A timeout was performed prior to the initiation of the procedure. 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 The left forearm was prepped and draped in usual sterile fashion. Maximum barrier sterile technique with sterile gowns and gloves were used for the procedure. Under ultrasound guidance, the AV fistula was accessed at the arterial limb directed towards the venous outflow allowing for placement of a 7 French vascular sheath. Despite the use of a Kumpe catheter, there is great difficulty advancing the glidewire beyond the peripheral most aneurism of the AV fistula As such, the occluded draining venous limb was accessed directed towards the anastomosis under direct ultrasound guidance allowing for placement of a micropuncture sheath.  Next, stiff glidewire was advanced to the level of the initial 7 [place] vascular sheath access. An ensnare was utilized to grasp the stiff glidewire which was then pulled through the aneurysm to the  level of the venous outflow. Limited contrast injection confirmed appropriate positioning. A left upper extremity central venogram was performed. Next, the occluded segment of the AV fistula was laced with 4 mg of tPA via Kumpe catheter. Next, thrombosed segment of the AV fistula was angioplastied at multiple stations with a 6 mm x 4 cm Conquest balloon. Mechanical thrombectomy was then performed with the use of both on Angiojet device as well as a Fogarty occlusion balloon. Initial images demonstrate restored patency of the AV fistula with residual near occlusive mural thrombus. As such, the previously thrombosed segment of the AV fistula were angioplastied with a 7 mm x 4 cm Conquest balloon and several additional rounds of mechanical thrombectomy was performed both with the Angiojet device as well as the Fogarty occlusion balloon. Completion images were obtained and while there was restored flow to the AV fistula, there was a significant amount of nonocclusive mural thrombus which appeared refractory to continued intervention. As such the decision was made to either confirmed or placed a new tunneled hemodialysis catheter to ensure durable dialysis access. At this point, the procedure was terminated. All wires, catheters and sheaths were removed from the patient. Hemostasis was achieved at the access site with deployment of a swizzle sutures which will be removed at the patient's next dialysis session. Dressings were placed. _________________________________________________________ Attention was now paid towards placement of a tunneled hemodialysis catheter. The right neck, chest and external portion of the pre-existing right internal jugular approach temporary hemodialysis catheter was prepped and draped in usual sterile fashion. As the temporary hemodialysis catheter puncture site was deemed to be too high the right internal jugular vein was accessed at a more caudal location allowing advancement of a microwire  to the level of the superior cavoatrial junction. The microwire was utilized for measurement purposes and a stiff glidewire was advanced to the level of the IVC. Next, a 19 cm tip to cuff palindrome hemodialysis catheter was tunneled from a entrance site on right anterior upper chest to the venotomy site. The pre-existing temporary hemodialysis catheter was removed intact. Peel-away sheath was placed and the dialysis catheter was inserted with tip ultimately terminating within the superior aspect of the right atrium. Catheter was noted to easily aspirate and flush. Postprocedural chest radiograph was negative for pneumothorax. Hemodialysis catheter was flushed with a appropriate heparin dwell. The patient tolerated the procedure without immediate postprocedural complication. FINDINGS: The existing left forearm radial-cephalic AV fistula is patent from the level of the anastomosis to its arterial stick zone however thrombosed centrally from that location to the distal humerus. 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 The cephalic vein restored patency at the level of the mid humerus and is patent to the level of the cephalic arch. The central venous system of the left upper extremity is widely patent to the level of the superior cavoatrial junction The AV fistula was successfully recannulated following pharmacological and mechanical thrombectomy interventions as detailed above including angioplasty of the thrombosed segments ultimately to 7 mm diameter. Completion images demonstrate restored patency to the fistula though there is a large amount of irregular nonocclusive mural thrombus which was refractory to continued intervention. As such  the decision was made to place a new tunneled hemodialysis catheter to ensure durable intravenous access. Note, the recently placed  temporary dialysis catheter was deemed too high and access site to allow for conversion. After successful ultrasound and fluoroscopic guided placement, a 19 cm tip to cuff tunneled hemodialysis catheter is appropriately positioned with end terminating within the superior aspect of the right atrium. IMPRESSION: 1. Technically successful left forearm radial-cephalic thrombolysis though with a moderate-to-large amount of residual nonocclusive thrombus which was refractory to continued percutaneous intervention. Given my concern for the lack of durability of this attempted declot, I placed a tunneled right internal jugular approach dialysis catheter to ensure durable dialysis access. 2. The draining left cephalic vein is patent from the level of the mid humerus to the level of the cephalic arch. 3. The central venous system of the right upper extremity is widely patent. ACCESS: - Recommend attempted access of the patient's left forearm AV fistula. - If three successful dialysis sessions are achieved via the fistula, the tunneled hemodialysis catheter may be removed as indicated. Electronically Signed   By: Sandi Mariscal M.D.   On: 10/06/2022 18:31   IR US Guide Vasc Access Right  Result Date: 10/06/2022 INDICATION: Clotted left forearm AV fistula.  Please attempted declot procedure. EXAM: 1. FISTULALYSIS 2. ANGIOPLASTY OF VENOUS LIMB AND VENOUS ANASTOMOSIS 3. ULTRASOUND GUIDANCE FOR VENOUS ACCESS 4. ULTRASOUND AND FLUOROSCOPIC GUIDED PLACEMENT OF RIGHT INTERNAL JUGULAR APPROACH HEMODIALYSIS CATHETER. COMPARISON:  Image guided placement of temporary hemodialysis catheter-10/04/2022 MEDICATIONS: Heparin 4000 units IV; TPA 4 mg into the AV fistula. CONTRAST:  40 cc Omnipaque 300 ANESTHESIA/SEDATION: Moderate (conscious) sedation was employed during this procedure. A total of Versed 4 mg and Fentanyl 200 mcg was administered intravenously. Moderate Sedation Time: 107 minutes. The patient's level of consciousness and vital  signs were monitored continuously by radiology nursing throughout the procedure under my direct supervision. FLUOROSCOPY TIME:  19 minutes (35 mGy) COMPLICATIONS: None immediate. TECHNIQUE: Informed written consent was obtained from the patient after a discussion of the risks, benefits and alternatives to treatment. Questions regarding the procedure were encouraged and answered. A timeout was performed prior to the initiation of the procedure. 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 The left forearm was prepped and draped in usual sterile fashion. Maximum barrier sterile technique with sterile gowns and gloves were used for the procedure. Under ultrasound guidance, the AV fistula was accessed at the arterial limb directed towards the venous outflow allowing for placement of a 7 French vascular sheath. Despite the use of a Kumpe catheter, there is great difficulty advancing the glidewire beyond the peripheral most aneurism of the AV fistula As such, the occluded draining venous limb was accessed directed towards the anastomosis under direct ultrasound guidance allowing for placement of a micropuncture sheath. Next, stiff glidewire was advanced to the level of the initial 7 [place] vascular sheath access. An ensnare was utilized to grasp the stiff glidewire which was then pulled through the aneurysm to the level of the venous outflow. Limited contrast injection confirmed appropriate positioning. A left upper extremity central venogram was performed. Next, the occluded segment of the AV fistula was laced with 4 mg of tPA via Kumpe catheter. Next, thrombosed segment of the AV fistula was angioplastied at multiple stations with a 6  mm x 4 cm Conquest balloon. Mechanical thrombectomy was then performed with the use of both on  Angiojet device as well as a Fogarty occlusion balloon. Initial images demonstrate restored patency of the AV fistula with residual near occlusive mural thrombus. As such, the previously thrombosed segment of the AV fistula were angioplastied with a 7 mm x 4 cm Conquest balloon and several additional rounds of mechanical thrombectomy was performed both with the Angiojet device as well as the Fogarty occlusion balloon. Completion images were obtained and while there was restored flow to the AV fistula, there was a significant amount of nonocclusive mural thrombus which appeared refractory to continued intervention. As such the decision was made to either confirmed or placed a new tunneled hemodialysis catheter to ensure durable dialysis access. At this point, the procedure was terminated. All wires, catheters and sheaths were removed from the patient. Hemostasis was achieved at the access site with deployment of a swizzle sutures which will be removed at the patient's next dialysis session. Dressings were placed. _________________________________________________________ Attention was now paid towards placement of a tunneled hemodialysis catheter. The right neck, chest and external portion of the pre-existing right internal jugular approach temporary hemodialysis catheter was prepped and draped in usual sterile fashion. As the temporary hemodialysis catheter puncture site was deemed to be too high the right internal jugular vein was accessed at a more caudal location allowing advancement of a microwire to the level of the superior cavoatrial junction. The microwire was utilized for measurement purposes and a stiff glidewire was advanced to the level of the IVC. Next, a 19 cm tip to cuff palindrome hemodialysis catheter was tunneled from a entrance site on right anterior upper chest to the venotomy site. The pre-existing temporary hemodialysis catheter was removed intact. Peel-away sheath was placed and the dialysis  catheter was inserted with tip ultimately terminating within the superior aspect of the right atrium. Catheter was noted to easily aspirate and flush. Postprocedural chest radiograph was negative for pneumothorax. Hemodialysis catheter was flushed with a appropriate heparin dwell. The patient tolerated the procedure without immediate postprocedural complication. FINDINGS: The existing left forearm radial-cephalic AV fistula is patent from the level of the anastomosis to its arterial stick zone however thrombosed centrally from that location to the distal humerus. 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 The cephalic vein restored patency at the level of the mid humerus and is patent to the level of the cephalic arch. The central venous system of the left upper extremity is widely patent to the level of the superior cavoatrial junction The AV fistula was successfully recannulated following pharmacological and mechanical thrombectomy interventions as detailed above including angioplasty of the thrombosed segments ultimately to 7 mm diameter. Completion images demonstrate restored patency to the fistula though there is a large amount of irregular nonocclusive mural thrombus which was refractory to continued intervention. As such the decision was made to place a new tunneled hemodialysis catheter to ensure durable intravenous access. Note, the recently placed temporary dialysis catheter was deemed too high and access site to allow for conversion. After successful ultrasound and fluoroscopic guided placement, a 19 cm tip to cuff tunneled hemodialysis catheter is appropriately positioned with end terminating within the superior aspect of the right atrium. IMPRESSION: 1. Technically successful left forearm radial-cephalic thrombolysis though with a moderate-to-large amount of  residual nonocclusive thrombus which was refractory to  continued percutaneous intervention. Given my concern for the lack of durability of this attempted declot, I placed a tunneled right internal jugular approach dialysis catheter to ensure durable dialysis access. 2. The draining left cephalic vein is patent from the level of the mid humerus to the level of the cephalic arch. 3. The central venous system of the right upper extremity is widely patent. ACCESS: - Recommend attempted access of the patient's left forearm AV fistula. - If three successful dialysis sessions are achieved via the fistula, the tunneled hemodialysis catheter may be removed as indicated. Electronically Signed   By: Sandi Mariscal M.D.   On: 10/06/2022 18:31   Medications:  sodium chloride      atorvastatin  40 mg Oral QHS   calcitRIOL  1 mcg Oral Q T,Th,Sa-HD   carvedilol  25 mg Oral BID WC   Chlorhexidine Gluconate Cloth  6 each Topical Q0600   Chlorhexidine Gluconate Cloth  6 each Topical Q0600   cloNIDine  0.1 mg Oral BID   gabapentin  100 mg Oral QHS   insulin aspart  0-6 Units Subcutaneous TID WC   prednisoLONE acetate  1 drop Left Eye Q8H   sevelamer carbonate  1,600 mg Oral TID WC   sodium chloride flush  3 mL Intravenous Q12H    OP Dialysis Orders: [facility]   Assessment/Plan: # ESRD;/vascular access issue: Missed last HD due to vascular access issue. S/p declot but still had thrombus so TDC placed. Still having difficulty with AVF. Consulting vascular surgery to further evaluate AVF. If no intervention is recommended patient can likely d/c with outpatient follow up   # Volume/ hypertension: BP improves after dialysis, continue carvedilol and volume management with HD   # Anemia of Chronic Kidney Disease: Hemoglobin at goal. No esa   # Secondary Hyperparathyroidism/Hyperphosphatemia: Ca low today, will use added calcium bath with HD. Continue home calcitriol 106mcg w/ HD and home sevelamer 1600mg  TID.    # Hyperkalemia: K+ 5.5 prior to HD, will correct with dialysis   # Additional recommendations: - Dose all meds for creatinine clearance < 10 ml/min  - Unless absolutely necessary, no MRIs with gadolinium.  - Implement save arm precautions.  Prefer needle sticks in the dorsum of the hands or wrists.  No blood pressure measurements in arm. - If blood transfusion is requested during hemodialysis sessions, please alert [place] prior to the session.  - Use synthetic opioids (Fentanyl/Dilaudid) if needed  Anice Paganini, PA-C 10/07/2022, 9:20 AM  [facility] Pager: [phone]

## 2022-10-07 NOTE — Progress Notes (Signed)
D/C order noted. Contacted [facility] and spoke to Berger. Clinic advised pt will d/c today and should resume care on Saturday. Gwinda Passe advised pt currently has a TDC. D/C summary, vascular consult, and renal/procedure note for today faxed to clinic for continuation of care.   Melven Sartorius Renal Navigator [phone]

## 2022-10-08 ENCOUNTER — Encounter (HOSPITAL_COMMUNITY): Payer: Self-pay

## 2022-10-11 ENCOUNTER — Telehealth: Payer: Self-pay

## 2022-10-11 NOTE — Transitions of Care (Post Inpatient/ED Visit) (Signed)
   10/11/2022  Name: [name] MRN: [id number] DOB: 1986-04-12  Today's TOC FU Call Status: Today's TOC FU Call Status:: Successful TOC FU Call Competed TOC FU Call Complete Date: 10/11/22  Transition Care Management Follow-up Telephone Call Date of Discharge: 09/08/22 Discharge Facility: [facility]) Type of Discharge: Inpatient Admission Primary Inpatient Discharge Diagnosis:: AV fistula occlusion How have you been since you were released from the hospital?: Better Any questions or concerns?: Yes (He would like to have the catheter out of his chest and understands that he needs to schedule an appointment with [facility].   He attends dialysis in [place] T/T/S) Patient Questions/Concerns:: He established care with Durene Fruits, NP 07/09/2022 and was to follow up with her in 3 months. He was a no show for his appt with her 07/23/2022.  I offered to schedule him for another follow up with Amy and he preferred to see another provider, so I scheduled him with Dr Redmond Pulling. Patient Questions/Concerns Addressed: Other:  Items Reviewed: Did you receive and understand the discharge instructions provided?: Yes Medications obtained and verified?: Yes (Medications Reviewed) (He stated he has all of his medications as well as a working glucometer.  he did not have any questions about the med regime.) Any new allergies since your discharge?: No Dietary orders reviewed?: No Do you have support at home?: Yes People in Home: alone Name of Support/Comfort Primary Source: He said he has family/ friends that can check on him  [place] and Equipment/Supplies: [place] Ordered?: No Any new equipment or medical supplies ordered?: No  Functional Questionnaire: Do you need assistance with bathing/showering or dressing?: No Do you need assistance with meal preparation?: No Do you need assistance with eating?: No Do you have difficulty maintaining continence: No Do you need assistance with  getting out of bed/getting out of a chair/moving?: No Do you have difficulty managing or taking your medications?: No  Follow up appointments reviewed: PCP Follow-up appointment confirmed?: Yes Date of PCP follow-up appointment?: 10/20/22 Follow-up Provider: Dr Redmond Pulling [facility] Follow-up appointment confirmed?: No (needs to schedule appointment with [facility]) Reason Specialist Follow-Up Not Confirmed: Patient has Specialist Provider Number and will Call for Appointment Do you need transportation to your follow-up appointment?: No Do you understand care options if your condition(s) worsen?: Yes-patient verbalized understanding    SIGNATURE [name], RN

## 2022-10-11 NOTE — Telephone Encounter (Signed)
From the discharge call:  He stated that he would like to have the catheter out of his chest and understands that he needs to schedule an appointment with [facility].   He attends dialysis in [place] T/T/S  He established care with Durene Fruits, NP 07/09/2022 and was to follow up with her in 3 months. He was a no show for his appt with her 07/23/2022.  I offered to schedule him for another follow up with Amy and he preferred to see another provider, so I scheduled him with Dr Redmond Pulling 10/20/2022.   He stated he has all of his medications as well as a working glucometer and he did not have any questions about the med regime.

## 2022-10-11 NOTE — Telephone Encounter (Signed)
Spoke with patient regarding scheduling surgery for a left radiocephalic AV fistula thrombectomy with possible revision, possible conversion to left brachiocephalic AV fistula. Patient reports that he had dialysis treatment on Saturday and the fistula worked fine and unsure if surgery is still required.   Spoke with Tommi Rumps, nurse at [facility] who confirmed no issues with fistula at this time. Advised to contact office if anything changes. He voiced understanding.

## 2022-10-14 ENCOUNTER — Telehealth: Payer: Self-pay

## 2022-10-14 ENCOUNTER — Other Ambulatory Visit: Payer: Self-pay

## 2022-10-14 NOTE — Telephone Encounter (Signed)
Per Dr. Stanford Breed, If it's working well then he doesn't need surgery. Would just remove catheter.   Per Ebony Hail at [facility], next available date for [facility] removal is 4/12 at 12PM.   Spoke with patient and informed of procedure date, 4/12 with arrival time of 1145 AM at [facility]. Patient voiced understanding.

## 2022-10-14 NOTE — Telephone Encounter (Signed)
Candace at [facility] in [place] called stating that pt's access was declotted and a TDC was placed by IR while in the hospital from 3/25-3/28. Requesting next step, OR for another procedure or TDC removal.  Reviewed pt's chart, returned call for clarification, two identifiers used. Pt's access has been successfully cannulated x 3, aneurysm still present although improved, and stitches in place.  Spoke with Herma Ard, RN in surgery scheduling who will contact [facility] once the POC is confirmed. Confirmed understanding.

## 2022-10-20 ENCOUNTER — Ambulatory Visit (INDEPENDENT_AMBULATORY_CARE_PROVIDER_SITE_OTHER): Payer: Medicaid Other | Admitting: Family Medicine

## 2022-10-20 ENCOUNTER — Encounter: Payer: Self-pay | Admitting: Family Medicine

## 2022-10-20 VITALS — BP 168/107 | HR 74 | Temp 98.1°F | Resp 16 | Ht >= 80 in | Wt 201.6 lb

## 2022-10-20 DIAGNOSIS — I10 Essential (primary) hypertension: Secondary | ICD-10-CM

## 2022-10-20 DIAGNOSIS — H5440 Blindness, one eye, unspecified eye: Secondary | ICD-10-CM | POA: Diagnosis not present

## 2022-10-20 DIAGNOSIS — N186 End stage renal disease: Secondary | ICD-10-CM

## 2022-10-20 DIAGNOSIS — L989 Disorder of the skin and subcutaneous tissue, unspecified: Secondary | ICD-10-CM

## 2022-10-20 DIAGNOSIS — E1122 Type 2 diabetes mellitus with diabetic chronic kidney disease: Secondary | ICD-10-CM | POA: Diagnosis not present

## 2022-10-20 NOTE — Telephone Encounter (Signed)
Received a call from [facility] at [facility] advising to cancel [facility] removal on 4/12 due to patient having bleeding around access site and was not working as well this morning. She will notify patient of procedure cancellation.

## 2022-10-20 NOTE — Progress Notes (Signed)
-  Patient concern about sweating when he eats -Home health aide -Placement sign -referrals  Therapist Dermatologist Endocrinologist  Opthalmology

## 2022-10-20 NOTE — Progress Notes (Signed)
Established Patient Office Visit  Subjective    Patient ID: [name], male    DOB: 05/29/1986  Age: 37 y.o. MRN: [id number]  CC: No chief complaint on file.   HPI [name] presents for follow up of chronic med issues. 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    Outpatient Encounter Medications as of 10/20/2022  Medication Sig   acetaminophen (TYLENOL) 325 MG tablet Take 2 tablets (650 mg total) by mouth every 6 (six) hours as needed for mild pain (or Fever >/= 101).   atorvastatin (LIPITOR) 40 MG tablet Take 40 mg by mouth daily.   B Complex-C-Folic Acid (RENA-VITE RX) 1 MG TABS Take 1 tablet by mouth daily.   calcitRIOL (ROCALTROL) 0.5 MCG capsule Take 2 capsules (1 mcg total) by mouth Every Tuesday,Thursday,and Saturday with dialysis.   carvedilol (COREG) 25 MG tablet Take 1 tablet (25 mg total) by mouth 2 (two) times daily with a meal.   cloNIDine (CATAPRES) 0.1 MG tablet Take 0.1 mg by mouth 2 (two) times daily.   insulin glargine-yfgn (SEMGLEE) 100 UNIT/ML injection Inject 0.12 mLs (12 Units total) into the skin at bedtime.   insulin lispro (HUMALOG) 100 UNIT/ML injection Inject 1-7 Units into the skin See admin instructions. Per sliding scale 3 times daily with meals 151-200= 1 unit 201-250= 2 units 251-300= 3 units 301-350= 5 units 351-400= 7 units Greater than 400 call md takes only as needed if blood sugar is over 200   Insulin Pen Needle (PEN NEEDLES) 31G X 8 MM MISC UAD   Multiple Vitamins-Minerals (MULTIVITAMIN WITH MINERALS) tablet Take 1 tablet by mouth daily.   multivitamin (RENA-VIT) TABS tablet Take 1 tablet by mouth at bedtime. (Patient not taking: Reported on 10/30/2021)   ondansetron (ZOFRAN) 4 MG tablet Take 4 mg by mouth every 8 (eight) hours as needed for nausea or vomiting.   prednisoLONE acetate (PRED FORTE) 1 % ophthalmic suspension Place 1 drop into the left eye See admin instructions. 2-3 times daily    sevelamer carbonate (RENVELA) 800 MG tablet Take 2 tablets (1,600 mg total) by mouth 3 (three) times daily with meals. (Patient not taking: Reported on 12/29/2021)   No facility-administered encounter medications on file as of 10/20/2022.    Past Medical History:  Diagnosis Date   ADHD (attention deficit hyperactivity disorder)    Diabetes mellitus    ESRD on hemodialysis    [facility]   High cholesterol    Hypertension    Hypertrophic cardiomyopathy 02/26/2021   Seizure 02/08/2021    Past Surgical History:  Procedure Laterality Date   AMPUTATION TOE Right 12/29/2021   Procedure: AMPUTATION TOE, fifth;  Surgeon: Louann Sjogren, DPM;  Location: [facility] OR;  Service: Podiatry;  Laterality: Right;  surgical team will do local block   AV FISTULA PLACEMENT Left 08/11/2020   Procedure: LEFT UPPER EXTREMITY ARTERIOVENOUS (AV) FISTULA CREATION;  Surgeon: Leonie Douglas, MD;  Location: [facility] OR;  Service: Vascular;  Laterality: Left;   FISTULA SUPERFICIALIZATION Left 12/02/2021   Procedure: PLICATION OF LEFT RADIUS CEPHALIC FISTULA;  Surgeon: Leonie Douglas, MD;  Location: [facility] OR;  Service: Vascular;  Laterality: Left;  PERIPHERAL NERVE BLOCK   FRACTURE SURGERY     I & D EXTREMITY Left 07/16/2014   Procedure: IRRIGATION AND DEBRIDEMENT EXTREMITY/LEFT INDEX FINGER;  Surgeon: Betha Loa, MD;  Location: [facility] OR;  Service: Orthopedics;  Laterality: Left;   IR FLUORO GUIDE  CV LINE RIGHT  10/04/2022   IR FLUORO GUIDE CV LINE RIGHT  10/05/2022   IR PERC TUN PERIT CATH WO PORT S&I /IMAG  08/07/2020   IR THROMBECTOMY AV FISTULA W/THROMBOLYSIS/PTA INC/SHUNT/IMG LEFT Left 10/05/2022   IR US GUIDE VASC ACCESS RIGHT  08/07/2020   IR US GUIDE VASC ACCESS RIGHT  10/04/2022   IR US GUIDE VASC ACCESS RIGHT  10/05/2022    Family History  Problem Relation Age of Onset   Cancer Mother    Stroke Father    Diabetes Father    Hypertension Father     Social History   Socioeconomic History   Marital  status: Single    Spouse name: Not on file   Number of children: Not on file   Years of education: Not on file   Highest education level: Not on file  Occupational History   Occupation: disabled  Tobacco Use   Smoking status: Never    Passive exposure: Never   Smokeless tobacco: Never  Vaping Use   Vaping Use: Never used  Substance and Sexual Activity   Alcohol use: Not Currently    Comment: social drinker   Drug use: No   Sexual activity: Yes  Other Topics Concern   Not on file  Social History Narrative   Not on file   Social Determinants of Health   Financial Resource Strain: Not on file  Food Insecurity: Food Insecurity Present (10/05/2022)   Hunger Vital Sign    Worried About Running Out of Food in the Last Year: Sometimes true    Ran Out of Food in the Last Year: Never true  Transportation Needs: No Transportation Needs (10/05/2022)   PRAPARE - Administrator, Civil Service (Medical): No    Lack of Transportation (Non-Medical): No  Physical Activity: Not on file  Stress: Not on file  Social Connections: Not on file  Intimate Partner Violence: Not At Risk (10/05/2022)   Humiliation, Afraid, Rape, and Kick questionnaire    Fear of Current or Ex-Partner: No    Emotionally Abused: No    Physically Abused: No    Sexually Abused: No    Review of Systems  All other systems reviewed and are negative.       Objective    BP (!) 168/107   Pulse 74   Temp 98.1 F (36.7 C) (Oral)   Resp 16   Ht 6\' 10"  (2.083 m)   Wt 201 lb 9.6 oz (91.4 kg)   SpO2 97%   BMI 21.08 kg/m   Physical Exam Vitals and nursing note reviewed.  Constitutional:      General: He is not in acute distress. Cardiovascular:     Rate and Rhythm: Normal rate and regular rhythm.  Pulmonary:     Effort: Pulmonary effort is normal.     Breath sounds: Normal breath sounds.  Abdominal:     Palpations: Abdomen is soft.     Tenderness: There is no abdominal tenderness.   Neurological:     General: No focal deficit present.     Mental Status: He is alert and oriented to person, place, and time.         Assessment & Plan:   1. Type 2 diabetes mellitus with ESRD (end-stage renal disease) Most recent A1c at goal. Continue  - [facility] DIABETES FOOT EXAM  2. Blindness of left eye, unspecified right eye visual impairment category Referral to ophthalmology for further eval/mgt - Ambulatory referral to Ophthalmology  3. Essential hypertension Elevated blood pressure  4. Skin lesion Referral to derm for further eval/mgt    Return in about 3 months (around 01/19/2023) for follow up.   Tommie RaymondWilson, Whittney Steenson P, MD

## 2022-10-22 ENCOUNTER — Encounter (HOSPITAL_COMMUNITY): Payer: Medicaid Other

## 2022-10-22 NOTE — Telephone Encounter (Signed)
Patient called office upset requesting to get [facility] removal rescheduled. Advised patient that our office would have to receive notification from the dialysis center that it is OK to remove catheter. Patient used profanity and didn't understand why the dialysis center called to cancel procedure and then proceeded to hang up.

## 2022-10-24 ENCOUNTER — Emergency Department (HOSPITAL_COMMUNITY)
Admission: EM | Admit: 2022-10-24 | Discharge: 2022-10-24 | Disposition: A | Discharge status: Home / Self Care | Payer: Medicaid Other | Attending: Emergency Medicine | Admitting: Emergency Medicine

## 2022-10-24 ENCOUNTER — Emergency Department (HOSPITAL_COMMUNITY): Payer: Medicaid Other

## 2022-10-24 ENCOUNTER — Other Ambulatory Visit: Payer: Self-pay

## 2022-10-24 ENCOUNTER — Encounter (HOSPITAL_COMMUNITY): Payer: Self-pay | Admitting: *Deleted

## 2022-10-24 DIAGNOSIS — R197 Diarrhea, unspecified: Secondary | ICD-10-CM | POA: Diagnosis not present

## 2022-10-24 DIAGNOSIS — R5381 Other malaise: Secondary | ICD-10-CM | POA: Insufficient documentation

## 2022-10-24 DIAGNOSIS — R112 Nausea with vomiting, unspecified: Secondary | ICD-10-CM | POA: Diagnosis present

## 2022-10-24 DIAGNOSIS — Z992 Dependence on renal dialysis: Secondary | ICD-10-CM | POA: Insufficient documentation

## 2022-10-24 DIAGNOSIS — Z20822 Contact with and (suspected) exposure to covid-19: Secondary | ICD-10-CM | POA: Insufficient documentation

## 2022-10-24 DIAGNOSIS — N186 End stage renal disease: Secondary | ICD-10-CM | POA: Diagnosis not present

## 2022-10-24 LAB — LACTIC ACID, PLASMA: Lactic Acid, Venous: 1.4 mmol/L (ref 0.5–1.9)

## 2022-10-24 LAB — CBC WITH DIFFERENTIAL/PLATELET
Abs Immature Granulocytes: 0.02 10*3/uL (ref 0.00–0.07)
Basophils Absolute: 0 10*3/uL (ref 0.0–0.1)
Basophils Relative: 0 %
Eosinophils Absolute: 0 10*3/uL (ref 0.0–0.5)
Eosinophils Relative: 0 %
HCT: 34 % — ABNORMAL LOW (ref 39.0–52.0)
Hemoglobin: 10.5 g/dL — ABNORMAL LOW (ref 13.0–17.0)
Immature Granulocytes: 0 %
Lymphocytes Relative: 3 %
Lymphs Abs: 0.2 10*3/uL — ABNORMAL LOW (ref 0.7–4.0)
MCH: 25.2 pg — ABNORMAL LOW (ref 26.0–34.0)
MCHC: 30.9 g/dL (ref 30.0–36.0)
MCV: 81.5 fL (ref 80.0–100.0)
Monocytes Absolute: 0.5 10*3/uL (ref 0.1–1.0)
Monocytes Relative: 6 %
Neutro Abs: 6.9 10*3/uL (ref 1.7–7.7)
Neutrophils Relative %: 91 %
Platelets: 230 10*3/uL (ref 150–400)
RBC: 4.17 MIL/uL — ABNORMAL LOW (ref 4.22–5.81)
RDW: 19.9 % — ABNORMAL HIGH (ref 11.5–15.5)
WBC: 7.6 10*3/uL (ref 4.0–10.5)
nRBC: 0 % (ref 0.0–0.2)

## 2022-10-24 LAB — COMPREHENSIVE METABOLIC PANEL
ALT: 11 U/L (ref 0–44)
AST: 15 U/L (ref 15–41)
Albumin: 3.6 g/dL (ref 3.5–5.0)
Alkaline Phosphatase: 55 U/L (ref 38–126)
Anion gap: 17 — ABNORMAL HIGH (ref 5–15)
BUN: 31 mg/dL — ABNORMAL HIGH (ref 6–20)
CO2: 23 mmol/L (ref 22–32)
Calcium: 8 mg/dL — ABNORMAL LOW (ref 8.9–10.3)
Chloride: 92 mmol/L — ABNORMAL LOW (ref 98–111)
Creatinine, Ser: 10.51 mg/dL — ABNORMAL HIGH (ref 0.61–1.24)
GFR, Estimated: 6 mL/min — ABNORMAL LOW (ref >60)
Glucose, Bld: 122 mg/dL — ABNORMAL HIGH (ref 70–99)
Potassium: 3.6 mmol/L (ref 3.5–5.1)
Sodium: 132 mmol/L — ABNORMAL LOW (ref 135–145)
Total Bilirubin: 0.9 mg/dL (ref 0.3–1.2)
Total Protein: 7.8 g/dL (ref 6.5–8.1)

## 2022-10-24 LAB — RESP PANEL BY RT-PCR (RSV, FLU A&B, COVID)  RVPGX2
Influenza A by PCR: NEGATIVE
Influenza B by PCR: NEGATIVE
Resp Syncytial Virus by PCR: NEGATIVE
SARS Coronavirus 2 by RT PCR: NEGATIVE

## 2022-10-24 LAB — PROTIME-INR
INR: 1.2 (ref 0.8–1.2)
Prothrombin Time: 15.1 seconds (ref 11.4–15.2)

## 2022-10-24 MED ORDER — ACETAMINOPHEN 325 MG PO TABS
650.0000 mg | ORAL_TABLET | Freq: Once | ORAL | Status: AC
  Administered 2022-10-24: 650 mg via ORAL
  Filled 2022-10-24: qty 2

## 2022-10-24 MED ORDER — MORPHINE SULFATE (PF) 2 MG/ML IV SOLN
2.0000 mg | Freq: Once | INTRAVENOUS | Status: AC
  Administered 2022-10-24: 2 mg via INTRAVENOUS
  Filled 2022-10-24: qty 1

## 2022-10-24 MED ORDER — ONDANSETRON HCL 4 MG/2ML IJ SOLN
4.0000 mg | Freq: Once | INTRAMUSCULAR | Status: AC
  Administered 2022-10-24: 4 mg via INTRAVENOUS
  Filled 2022-10-24: qty 2

## 2022-10-24 MED ORDER — METOCLOPRAMIDE HCL 5 MG/ML IJ SOLN
5.0000 mg | Freq: Once | INTRAMUSCULAR | Status: AC
  Administered 2022-10-24: 5 mg via INTRAVENOUS
  Filled 2022-10-24: qty 2

## 2022-10-24 MED ORDER — SODIUM CHLORIDE 0.9 % IV BOLUS
500.0000 mL | Freq: Once | INTRAVENOUS | Status: AC
  Administered 2022-10-24: 500 mL via INTRAVENOUS

## 2022-10-24 NOTE — ED Provider Notes (Signed)
[place] EMERGENCY DEPARTMENT AT [facility] Provider Note   CSN: [id number] Arrival date & time: 10/24/22  1341     History  Chief Complaint  Patient presents with   Abdominal Pain   Nausea    [name] is a 37 y.o. male.  HPI   37 year old male with ESRD on HD every TTS presents to the emergency department with concern for generalized unwell feeling with upper abdominal pain associated with nausea/vomiting/diarrhea.  This has been going on for the past 2 days.  He denies any blood in the emesis or stool.  States that he is been having a low-grade fever at home, took doses of Tylenol this morning.  Received dialysis 2 days ago at his regular scheduled time.  Denies any chest pain, shortness of breath, orthopnea, swelling of his lower extremities.  Patient still makes small amount of urine but denies any other acute GU symptoms. Denies any previous surgeries in the abdomen.  Home Medications Prior to Admission medications   Medication Sig Start Date End Date Taking? Authorizing Provider  acetaminophen (TYLENOL) 325 MG tablet Take 2 tablets (650 mg total) by mouth every 6 (six) hours as needed for mild pain (or Fever >/= 101). 12/30/21   Rhetta Mura, MD  atorvastatin (LIPITOR) 40 MG tablet Take 40 mg by mouth daily.    [provider]  B Complex-C-Folic Acid (RENA-VITE RX) 1 MG TABS Take 1 tablet by mouth daily. 06/28/22   [provider]  calcitRIOL (ROCALTROL) 0.5 MCG capsule Take 2 capsules (1 mcg total) by mouth Every Tuesday,Thursday,and Saturday with dialysis. 12/31/21   Rhetta Mura, MD  carvedilol (COREG) 25 MG tablet Take 1 tablet (25 mg total) by mouth 2 (two) times daily with a meal. 03/05/21   Russella Dar, NP  cloNIDine (CATAPRES) 0.1 MG tablet Take 0.1 mg by mouth 2 (two) times daily. 04/20/22   [provider]  insulin glargine-yfgn (SEMGLEE) 100 UNIT/ML injection Inject 0.12 mLs (12 Units total) into the skin  at bedtime. 07/09/22   Rema Fendt, NP  insulin lispro (HUMALOG) 100 UNIT/ML injection Inject 1-7 Units into the skin See admin instructions. Per sliding scale 3 times daily with meals 151-200= 1 unit 201-250= 2 units 251-300= 3 units 301-350= 5 units 351-400= 7 units Greater than 400 call md takes only as needed if blood sugar is over 200    [provider]  Insulin Pen Needle (PEN NEEDLES) 31G X 8 MM MISC UAD 07/09/22   Rema Fendt, NP  Multiple Vitamins-Minerals (MULTIVITAMIN WITH MINERALS) tablet Take 1 tablet by mouth daily.    [provider]  multivitamin (RENA-VIT) TABS tablet Take 1 tablet by mouth at bedtime. Patient not taking: Reported on 10/30/2021 11/27/20   [name], MD  ondansetron (ZOFRAN) 4 MG tablet Take 4 mg by mouth every 8 (eight) hours as needed for nausea or vomiting. 04/12/21   [provider]  prednisoLONE acetate (PRED FORTE) 1 % ophthalmic suspension Place 1 drop into the left eye See admin instructions. 2-3 times daily 10/15/21   [provider]  sevelamer carbonate (RENVELA) 800 MG tablet Take 2 tablets (1,600 mg total) by mouth 3 (three) times daily with meals. Patient not taking: Reported on 12/29/2021 03/05/21   Russella Dar, NP      Allergies    Amlodipine and Hydralazine    Review of Systems   Review of Systems  Constitutional:  Positive for appetite change, chills,  fatigue and fever.  HENT:  Negative for sore throat.   Respiratory:  Negative for shortness of breath.   Cardiovascular:  Negative for chest pain.  Gastrointestinal:  Positive for abdominal pain, diarrhea, nausea and vomiting. Negative for abdominal distention, blood in stool and rectal pain.  Genitourinary:  Negative for dysuria and hematuria.  Musculoskeletal:  Negative for back pain and neck pain.  Skin:  Negative for rash.  Neurological:  Negative for headaches.    Physical Exam Updated Vital Signs BP (!) 168/102   Pulse 82   Temp  (!) 101.8 F (38.8 C) (Oral)   Resp 13   Ht 6' (1.829 m)   Wt 91.4 kg   SpO2 100%   BMI 27.34 kg/m  Physical Exam Vitals and nursing note reviewed.  Constitutional:      General: He is not in acute distress.    Appearance: Normal appearance.  HENT:     Head: Normocephalic.     Mouth/Throat:     Mouth: Mucous membranes are moist.  Cardiovascular:     Rate and Rhythm: Normal rate.  Pulmonary:     Effort: Pulmonary effort is normal. No respiratory distress.     Breath sounds: No rales.  Abdominal:     General: There is no distension.     Palpations: Abdomen is soft.     Tenderness: There is abdominal tenderness in the right upper quadrant, epigastric area and left upper quadrant. There is no guarding.  Skin:    General: Skin is warm.  Neurological:     Mental Status: He is alert and oriented to person, place, and time. Mental status is at baseline.  Psychiatric:        Mood and Affect: Mood normal.     ED Results / Procedures / Treatments   Labs (all labs ordered are listed, but only abnormal results are displayed) Labs Reviewed  COMPREHENSIVE METABOLIC PANEL - Abnormal; Notable for the following components:      Result Value   Sodium 132 (*)    Chloride 92 (*)    Glucose, Bld 122 (*)    BUN 31 (*)    Creatinine, Ser 10.51 (*)    Calcium 8.0 (*)    GFR, Estimated 6 (*)    Anion gap 17 (*)    All other components within normal limits  CBC WITH DIFFERENTIAL/PLATELET - Abnormal; Notable for the following components:   RBC 4.17 (*)    Hemoglobin 10.5 (*)    HCT 34.0 (*)    MCH 25.2 (*)    RDW 19.9 (*)    Lymphs Abs 0.2 (*)    All other components within normal limits  RESP PANEL BY RT-PCR (RSV, FLU A&B, COVID)  RVPGX2  CULTURE, BLOOD (ROUTINE X 2)  CULTURE, BLOOD (ROUTINE X 2)  LACTIC ACID, PLASMA  PROTIME-INR  LACTIC ACID, PLASMA  URINALYSIS, ROUTINE W REFLEX MICROSCOPIC    EKG None  Radiology DG Chest 2 View  Result Date: 10/24/2022 CLINICAL DATA:   Fever, vomiting and flu-like symptoms since Friday EXAM: CHEST - 2 VIEW COMPARISON:  Chest x-ray dated 10/04/2022. FINDINGS: Stable cardiomegaly. Lungs are clear. No pleural effusion or pneumothorax is seen. Osseous structures about the chest are unremarkable. IMPRESSION: No active cardiopulmonary disease. No evidence of pneumonia or pulmonary edema. Stable cardiomegaly. Electronically Signed   By: Bary Richard M.D.   On: 10/24/2022 15:14    Procedures Procedures    Medications Ordered in ED Medications  sodium chloride 0.9 %  bolus 500 mL (has no administration in time range)  morphine (PF) 2 MG/ML injection 2 mg (has no administration in time range)  ondansetron (ZOFRAN) injection 4 mg (has no administration in time range)  acetaminophen (TYLENOL) tablet 650 mg (has no administration in time range)    ED Course/ Medical Decision Making/ A&P                             Medical Decision Making Amount and/or Complexity of Data Reviewed Labs: ordered. Radiology: ordered.  Risk OTC drugs. Prescription drug management.   37 year old male on HD presents with n/v/d and general malaise. Compliant with dialysis, last session Saturday, yesterday.  Patient is febrile on arrival.  Mild epigastric tenderness to palpation but otherwise benign abdomen.  Blood work shows baseline anemia and kidney dysfunction, normal Celia count, normal lactic acid, normal abdominal labs.  Respiratory panel is negative.  After dose of Tylenol fever is downtrending.  Ultrasound of the gallbladder shows no acute finding.  On reevaluation after medications he feels improved, looks improved.  Reevaluation of the abdomen is benign, soft, nontender.  Low suspicion for acute abdominal process at this time, do not feel he warrants emergent CT.  Most likely viral process.  Patient was able to p.o., plan for outpatient symptomatic treatment.  Patient agrees.  Patient at this time appears safe and stable for discharge and  close outpatient follow up. Discharge plan and strict return to ED precautions discussed, patient verbalizes understanding and agreement.        Final Clinical Impression(s) / ED Diagnoses Final diagnoses:  None    Rx / DC Orders ED Discharge Orders     None         Rozelle Logan, DO 10/24/22 2355

## 2022-10-24 NOTE — ED Notes (Signed)
Dc instructions reviewed with pt and family member no questions or concerns at this time. Will follow up with pcp and will be compliant with dialysis. Pt verbalized understanding of taking OTC pain medication. Provided pt with ginger and crackers for ride home. Pt wheeled out by family member.

## 2022-10-24 NOTE — ED Notes (Signed)
Family member to nurses station stating she needs to take patient home at this time or will have to come back tomorrow. EDP made aware and stated pt will be discharged.

## 2022-10-24 NOTE — Discharge Instructions (Addendum)
You have been seen and discharged from the emergency department.  Your blood work showed no acute changes for you.  Ultrasound of your gallbladder was unremarkable.  You were treated with IV medications.  Continue to take over-the-counter medications as needed for pain/fever control.  Stay well-hydrated.  Stay compliant with dialysis.  Follow-up with your primary provider for further evaluation and further care. Take home medications as prescribed. If you have any worsening symptoms, severe abdominal pain, intractable vomiting/diarrhea or further concerns for your health please return to an emergency department for further evaluation.

## 2022-10-24 NOTE — ED Triage Notes (Addendum)
Pt here via [facility] from home for vomiting and flu-like symptoms since Friday.  Vomiting, diarrhea and dizziness.  Temp of 101.8.  Last dialysis was Saturday.  Pt has taken 5 tylenol since midnight, last at 1200.  Bp 180/102 Cbg 146

## 2022-10-24 NOTE — ED Notes (Signed)
Pt oxygen saturation noted to drop to 70% while sleeping. Pt easily awoken. Placed on 2L [place].

## 2022-10-25 ENCOUNTER — Encounter: Payer: Self-pay | Admitting: Family Medicine

## 2022-10-25 LAB — BLOOD CULTURE ID PANEL (REFLEXED) - BCID2

## 2022-10-25 LAB — CULTURE, BLOOD (ROUTINE X 2)

## 2022-10-25 NOTE — ED Notes (Signed)
Lab called Blood Culture report postive for Staph aureus in 1 of 4 sets, results given to Dr. Preston Fleeting instructed to call patient back and for them to retiurn to the  ED. Called [phone], message left.

## 2022-10-26 ENCOUNTER — Telehealth (HOSPITAL_COMMUNITY): Payer: Self-pay | Admitting: Pharmacist

## 2022-10-26 ENCOUNTER — Emergency Department (HOSPITAL_COMMUNITY): Payer: Medicaid Other

## 2022-10-26 ENCOUNTER — Other Ambulatory Visit: Payer: Self-pay

## 2022-10-26 ENCOUNTER — Inpatient Hospital Stay (HOSPITAL_COMMUNITY)
Admission: EM | Admit: 2022-10-26 | Discharge: 2022-10-30 | DRG: 314 | Disposition: A | Discharge status: Home / Self Care | Payer: Medicaid Other | Attending: Internal Medicine | Admitting: Internal Medicine

## 2022-10-26 DIAGNOSIS — Z8616 Personal history of COVID-19: Secondary | ICD-10-CM

## 2022-10-26 DIAGNOSIS — T80211A Bloodstream infection due to central venous catheter, initial encounter: Principal | ICD-10-CM | POA: Diagnosis present

## 2022-10-26 DIAGNOSIS — F909 Attention-deficit hyperactivity disorder, unspecified type: Secondary | ICD-10-CM | POA: Diagnosis present

## 2022-10-26 DIAGNOSIS — Z89421 Acquired absence of other right toe(s): Secondary | ICD-10-CM | POA: Diagnosis not present

## 2022-10-26 DIAGNOSIS — I132 Hypertensive heart and chronic kidney disease with heart failure and with stage 5 chronic kidney disease, or end stage renal disease: Secondary | ICD-10-CM | POA: Diagnosis present

## 2022-10-26 DIAGNOSIS — Y848 Other medical procedures as the cause of abnormal reaction of the patient, or of later complication, without mention of misadventure at the time of the procedure: Secondary | ICD-10-CM | POA: Diagnosis present

## 2022-10-26 DIAGNOSIS — Z8249 Family history of ischemic heart disease and other diseases of the circulatory system: Secondary | ICD-10-CM | POA: Diagnosis not present

## 2022-10-26 DIAGNOSIS — I330 Acute and subacute infective endocarditis: Secondary | ICD-10-CM | POA: Diagnosis present

## 2022-10-26 DIAGNOSIS — B9561 Methicillin susceptible Staphylococcus aureus infection as the cause of diseases classified elsewhere: Secondary | ICD-10-CM | POA: Diagnosis present

## 2022-10-26 DIAGNOSIS — Z992 Dependence on renal dialysis: Secondary | ICD-10-CM | POA: Diagnosis not present

## 2022-10-26 DIAGNOSIS — E1122 Type 2 diabetes mellitus with diabetic chronic kidney disease: Secondary | ICD-10-CM | POA: Diagnosis present

## 2022-10-26 DIAGNOSIS — T827XXA Infection and inflammatory reaction due to other cardiac and vascular devices, implants and grafts, initial encounter: Secondary | ICD-10-CM | POA: Diagnosis not present

## 2022-10-26 DIAGNOSIS — Z888 Allergy status to other drugs, medicaments and biological substances status: Secondary | ICD-10-CM

## 2022-10-26 DIAGNOSIS — D649 Anemia, unspecified: Secondary | ICD-10-CM | POA: Diagnosis not present

## 2022-10-26 DIAGNOSIS — I340 Nonrheumatic mitral (valve) insufficiency: Secondary | ICD-10-CM | POA: Diagnosis not present

## 2022-10-26 DIAGNOSIS — I5022 Chronic systolic (congestive) heart failure: Secondary | ICD-10-CM | POA: Diagnosis present

## 2022-10-26 DIAGNOSIS — Z833 Family history of diabetes mellitus: Secondary | ICD-10-CM

## 2022-10-26 DIAGNOSIS — E7800 Pure hypercholesterolemia, unspecified: Secondary | ICD-10-CM | POA: Diagnosis present

## 2022-10-26 DIAGNOSIS — R7881 Bacteremia: Secondary | ICD-10-CM | POA: Diagnosis not present

## 2022-10-26 DIAGNOSIS — I361 Nonrheumatic tricuspid (valve) insufficiency: Secondary | ICD-10-CM | POA: Diagnosis not present

## 2022-10-26 DIAGNOSIS — I10 Essential (primary) hypertension: Secondary | ICD-10-CM | POA: Diagnosis present

## 2022-10-26 DIAGNOSIS — M898X9 Other specified disorders of bone, unspecified site: Secondary | ICD-10-CM | POA: Diagnosis present

## 2022-10-26 DIAGNOSIS — I422 Other hypertrophic cardiomyopathy: Secondary | ICD-10-CM | POA: Diagnosis present

## 2022-10-26 DIAGNOSIS — N186 End stage renal disease: Secondary | ICD-10-CM

## 2022-10-26 DIAGNOSIS — N2581 Secondary hyperparathyroidism of renal origin: Secondary | ICD-10-CM | POA: Diagnosis present

## 2022-10-26 DIAGNOSIS — Z79899 Other long term (current) drug therapy: Secondary | ICD-10-CM

## 2022-10-26 DIAGNOSIS — Z794 Long term (current) use of insulin: Secondary | ICD-10-CM | POA: Diagnosis not present

## 2022-10-26 DIAGNOSIS — I120 Hypertensive chronic kidney disease with stage 5 chronic kidney disease or end stage renal disease: Secondary | ICD-10-CM | POA: Diagnosis not present

## 2022-10-26 DIAGNOSIS — D631 Anemia in chronic kidney disease: Secondary | ICD-10-CM | POA: Diagnosis not present

## 2022-10-26 DIAGNOSIS — E109 Type 1 diabetes mellitus without complications: Secondary | ICD-10-CM | POA: Diagnosis present

## 2022-10-26 DIAGNOSIS — I5030 Unspecified diastolic (congestive) heart failure: Secondary | ICD-10-CM | POA: Diagnosis not present

## 2022-10-26 DIAGNOSIS — I517 Cardiomegaly: Secondary | ICD-10-CM | POA: Diagnosis not present

## 2022-10-26 DIAGNOSIS — I351 Nonrheumatic aortic (valve) insufficiency: Secondary | ICD-10-CM | POA: Diagnosis present

## 2022-10-26 DIAGNOSIS — D638 Anemia in other chronic diseases classified elsewhere: Secondary | ICD-10-CM | POA: Diagnosis present

## 2022-10-26 LAB — COMPREHENSIVE METABOLIC PANEL WITH GFR
ALT: 19 U/L (ref 0–44)
AST: 13 U/L — ABNORMAL LOW (ref 15–41)
Albumin: 3.5 g/dL (ref 3.5–5.0)
Alkaline Phosphatase: 46 U/L (ref 38–126)
Anion gap: 17 — ABNORMAL HIGH (ref 5–15)
BUN: 57 mg/dL — ABNORMAL HIGH (ref 6–20)
CO2: 22 mmol/L (ref 22–32)
Calcium: 6.9 mg/dL — ABNORMAL LOW (ref 8.9–10.3)
Chloride: 92 mmol/L — ABNORMAL LOW (ref 98–111)
Creatinine, Ser: 16.64 mg/dL — ABNORMAL HIGH (ref 0.61–1.24)
GFR, Estimated: 3 mL/min — ABNORMAL LOW
Glucose, Bld: 106 mg/dL — ABNORMAL HIGH (ref 70–99)
Potassium: 3.7 mmol/L (ref 3.5–5.1)
Sodium: 131 mmol/L — ABNORMAL LOW (ref 135–145)
Total Bilirubin: 0.6 mg/dL (ref 0.3–1.2)
Total Protein: 7.3 g/dL (ref 6.5–8.1)

## 2022-10-26 LAB — CBC WITH DIFFERENTIAL/PLATELET
Abs Immature Granulocytes: 0.02 K/uL (ref 0.00–0.07)
Basophils Absolute: 0 K/uL (ref 0.0–0.1)
Basophils Relative: 0 %
Eosinophils Absolute: 0.1 K/uL (ref 0.0–0.5)
Eosinophils Relative: 2 %
HCT: 28.3 % — ABNORMAL LOW (ref 39.0–52.0)
Hemoglobin: 9.1 g/dL — ABNORMAL LOW (ref 13.0–17.0)
Immature Granulocytes: 0 %
Lymphocytes Relative: 12 %
Lymphs Abs: 0.6 K/uL — ABNORMAL LOW (ref 0.7–4.0)
MCH: 25.3 pg — ABNORMAL LOW (ref 26.0–34.0)
MCHC: 32.2 g/dL (ref 30.0–36.0)
MCV: 78.8 fL — ABNORMAL LOW (ref 80.0–100.0)
Monocytes Absolute: 1.2 K/uL — ABNORMAL HIGH (ref 0.1–1.0)
Monocytes Relative: 23 %
Neutro Abs: 3.1 K/uL (ref 1.7–7.7)
Neutrophils Relative %: 63 %
Platelets: 158 K/uL (ref 150–400)
RBC: 3.59 MIL/uL — ABNORMAL LOW (ref 4.22–5.81)
RDW: 19.9 % — ABNORMAL HIGH (ref 11.5–15.5)
WBC: 5.1 K/uL (ref 4.0–10.5)
nRBC: 0 % (ref 0.0–0.2)

## 2022-10-26 LAB — CULTURE, BLOOD (ROUTINE X 2)

## 2022-10-26 MED ORDER — CHLORHEXIDINE GLUCONATE CLOTH 2 % EX PADS
6.0000 | MEDICATED_PAD | Freq: Every day | CUTANEOUS | Status: DC
  Administered 2022-10-27 – 2022-10-30 (×4): 6 via TOPICAL

## 2022-10-26 MED ORDER — SODIUM CHLORIDE 0.9% FLUSH
3.0000 mL | Freq: Two times a day (BID) | INTRAVENOUS | Status: DC
  Administered 2022-10-26 – 2022-10-30 (×8): 3 mL via INTRAVENOUS

## 2022-10-26 MED ORDER — CARVEDILOL 25 MG PO TABS
25.0000 mg | ORAL_TABLET | Freq: Two times a day (BID) | ORAL | Status: DC
  Administered 2022-10-26 – 2022-10-30 (×8): 25 mg via ORAL
  Filled 2022-10-26 (×8): qty 1

## 2022-10-26 MED ORDER — HEPARIN SODIUM (PORCINE) 1000 UNIT/ML DIALYSIS
2000.0000 [IU] | Freq: Once | INTRAMUSCULAR | Status: AC
  Administered 2022-10-26: 2000 [IU] via INTRAVENOUS_CENTRAL
  Filled 2022-10-26 (×2): qty 2

## 2022-10-26 MED ORDER — LIDOCAINE-PRILOCAINE 2.5-2.5 % EX CREA: 1.0000 | TOPICAL_CREAM | CUTANEOUS | Status: DC | PRN

## 2022-10-26 MED ORDER — ALTEPLASE 2 MG IJ SOLR: 2.0000 mg | Freq: Once | INTRAMUSCULAR | Status: DC | PRN

## 2022-10-26 MED ORDER — PROSOURCE PLUS PO LIQD
30.0000 mL | Freq: Two times a day (BID) | ORAL | Status: DC
  Filled 2022-10-26 (×3): qty 30

## 2022-10-26 MED ORDER — CLONIDINE HCL 0.1 MG PO TABS
0.1000 mg | ORAL_TABLET | Freq: Two times a day (BID) | ORAL | Status: DC
  Administered 2022-10-26 – 2022-10-30 (×7): 0.1 mg via ORAL
  Filled 2022-10-26 (×7): qty 1

## 2022-10-26 MED ORDER — LIDOCAINE HCL (PF) 1 % IJ SOLN: 5.0000 mL | INTRAMUSCULAR | Status: DC | PRN

## 2022-10-26 MED ORDER — ACETAMINOPHEN 650 MG RE SUPP: 650.0000 mg | Freq: Four times a day (QID) | RECTAL | Status: DC | PRN

## 2022-10-26 MED ORDER — ACETAMINOPHEN 325 MG PO TABS
650.0000 mg | ORAL_TABLET | Freq: Four times a day (QID) | ORAL | Status: DC | PRN
  Administered 2022-10-26 – 2022-10-30 (×4): 650 mg via ORAL
  Filled 2022-10-26 (×4): qty 2

## 2022-10-26 MED ORDER — CEFAZOLIN SODIUM-DEXTROSE 1-4 GM/50ML-% IV SOLN
1.0000 g | INTRAVENOUS | Status: AC
  Administered 2022-10-26 – 2022-10-29 (×4): 1 g via INTRAVENOUS
  Filled 2022-10-26 (×4): qty 50

## 2022-10-26 MED ORDER — POLYETHYLENE GLYCOL 3350 17 G PO PACK: 17.0000 g | PACK | Freq: Every day | ORAL | Status: DC | PRN

## 2022-10-26 MED ORDER — CALCITRIOL 0.5 MCG PO CAPS
1.0000 ug | ORAL_CAPSULE | ORAL | Status: DC
  Administered 2022-10-26 – 2022-10-30 (×2): 1 ug via ORAL
  Filled 2022-10-26 (×3): qty 2

## 2022-10-26 MED ORDER — ANTICOAGULANT SODIUM CITRATE 4% (200MG/5ML) IV SOLN: 5.0000 mL | Status: DC | PRN

## 2022-10-26 MED ORDER — HEPARIN SODIUM (PORCINE) 1000 UNIT/ML DIALYSIS
1000.0000 [IU] | INTRAMUSCULAR | Status: DC | PRN
  Administered 2022-10-28: 2000 [IU]
  Filled 2022-10-26: qty 1

## 2022-10-26 MED ORDER — HEPARIN SODIUM (PORCINE) 5000 UNIT/ML IJ SOLN
5000.0000 [IU] | Freq: Three times a day (TID) | INTRAMUSCULAR | Status: DC
  Administered 2022-10-26 – 2022-10-30 (×12): 5000 [IU] via SUBCUTANEOUS
  Filled 2022-10-26 (×12): qty 1

## 2022-10-26 MED ORDER — PENTAFLUOROPROP-TETRAFLUOROETH EX AERO: 1.0000 | INHALATION_SPRAY | CUTANEOUS | Status: DC | PRN

## 2022-10-26 MED ORDER — CEFAZOLIN SODIUM-DEXTROSE 1-4 GM/50ML-% IV SOLN
1.0000 g | Freq: Once | INTRAVENOUS | Status: AC
  Administered 2022-10-26: 1 g via INTRAVENOUS
  Filled 2022-10-26: qty 50

## 2022-10-26 NOTE — ED Notes (Signed)
Got patient undressed into a gown on the monitor patient is resting with call bell in reach got patient a warm blanket 

## 2022-10-26 NOTE — H&P (Signed)
History and Physical   [name] ZOX:[id number] DOB: 07-12-1986 DOA: 10/26/2022  PCP: Rema Fendt, NP   Patient coming from: Home  Chief Complaint: Positive blood cultures  HPI: [name] is a 37 y.o. male with medical history significant of diastolic CHF, ESRD on HD, anemia, hypertension, diabetes presenting with positive blood cultures  Patient was seen in the ED on 4/14 presenting with fever, nausea, vomiting, diarrhea for couple days.  Also feeling generally unwell at that time.  Symptoms have been ongoing since around 4/12.  He felt better in the ED and workup was overall reassuring and patient was discharged.  Blood cultures obtained to the visit have come back positive today for MSSA in both bottles.  Patient advised to come back to the ED for admission for IV antibiotics.  Patient states that he overall feels improved from when he was here 2 days ago but does continue to feel somewhat generally unwell and have a decreased appetite.  His last dialysis was on Saturday is due today.  He denies fevers, chills, chest pain, shortness of breath.  ED Course: Vital signs in the ED significant for blood pressure in the 160s to 170s systolic.  Lab workup included CMP with sodium 131 which is near baseline, chloride 92, BUN 57, creatinine elevated to 16.6 in the setting of ESRD, glucose 106, anion gap 17, calcium 6.9.  CBC with hemoglobin of 9.1 down somewhat from baseline of 10.  Repeat blood cultures obtained as well.  Chest x-ray showed left congestion and trace edema.  Patient started on IV cefazolin in the ED.  Echocardiogram has been ordered by ID who I suspect was out of consulted by the fact that patient had positive blood cultures.  Review of Systems: As per HPI otherwise all other systems reviewed and are negative.  Past Medical History:  Diagnosis Date   ADHD (attention deficit hyperactivity disorder)    Diabetes mellitus    ESRD on hemodialysis    [facility] [place]  TTHS   High cholesterol    Hypertension    Hypertrophic cardiomyopathy 02/26/2021   Seizure 02/08/2021    Past Surgical History:  Procedure Laterality Date   AMPUTATION TOE Right 12/29/2021   Procedure: AMPUTATION TOE, fifth;  Surgeon: Louann Sjogren, DPM;  Location: [facility] OR;  Service: Podiatry;  Laterality: Right;  surgical team will do local block   AV FISTULA PLACEMENT Left 08/11/2020   Procedure: LEFT UPPER EXTREMITY ARTERIOVENOUS (AV) FISTULA CREATION;  Surgeon: Leonie Douglas, MD;  Location: [facility] OR;  Service: Vascular;  Laterality: Left;   FISTULA SUPERFICIALIZATION Left 12/02/2021   Procedure: PLICATION OF LEFT RADIUS CEPHALIC FISTULA;  Surgeon: Leonie Douglas, MD;  Location: [facility] OR;  Service: Vascular;  Laterality: Left;  PERIPHERAL NERVE BLOCK   FRACTURE SURGERY     I & D EXTREMITY Left 07/16/2014   Procedure: IRRIGATION AND DEBRIDEMENT EXTREMITY/LEFT INDEX FINGER;  Surgeon: Betha Loa, MD;  Location: [facility] OR;  Service: Orthopedics;  Laterality: Left;   IR FLUORO GUIDE CV LINE RIGHT  10/04/2022   IR FLUORO GUIDE CV LINE RIGHT  10/05/2022   IR PERC TUN PERIT CATH WO PORT S&I /IMAG  08/07/2020   IR THROMBECTOMY AV FISTULA W/THROMBOLYSIS/PTA INC/SHUNT/IMG LEFT Left 10/05/2022   IR US GUIDE VASC ACCESS RIGHT  08/07/2020   IR US GUIDE VASC ACCESS RIGHT  10/04/2022   IR US GUIDE VASC ACCESS RIGHT  10/05/2022    Social History  reports that he has never smoked.  He has never been exposed to tobacco smoke. He has never used smokeless tobacco. He reports that he does not currently use alcohol. He reports that he does not use drugs.  Allergies  Allergen Reactions   Amlodipine Nausea And Vomiting and Other (See Comments)    Patient was taking Amlodipine and Hydralazine at the same time, so the reactions came from one of the 2: Lethargy and an all-over feeling of NOT feeling well (also)   Hydralazine Nausea And Vomiting and Other (See Comments)    Patient was taking Hydralazine AND Amlodipine at  the same time, so the reactions came from one of the 2: Lethargy and an all-over feeling of NOT feeling well (also)    Family History  Problem Relation Age of Onset   Cancer Mother    Stroke Father    Diabetes Father    Hypertension Father   Reviewed on admission  Prior to Admission medications   Medication Sig Start Date End Date Taking? Authorizing Provider  acetaminophen (TYLENOL) 325 MG tablet Take 2 tablets (650 mg total) by mouth every 6 (six) hours as needed for mild pain (or Fever >/= 101). 12/30/21   Rhetta Mura, MD  atorvastatin (LIPITOR) 40 MG tablet Take 40 mg by mouth daily.    [provider]  B Complex-C-Folic Acid (RENA-VITE RX) 1 MG TABS Take 1 tablet by mouth daily. 06/28/22   [provider]  calcitRIOL (ROCALTROL) 0.5 MCG capsule Take 2 capsules (1 mcg total) by mouth Every Tuesday,Thursday,and Saturday with dialysis. 12/31/21   Rhetta Mura, MD  carvedilol (COREG) 25 MG tablet Take 1 tablet (25 mg total) by mouth 2 (two) times daily with a meal. 03/05/21   Russella Dar, NP  cloNIDine (CATAPRES) 0.1 MG tablet Take 0.1 mg by mouth 2 (two) times daily. 04/20/22   [provider]  insulin glargine-yfgn (SEMGLEE) 100 UNIT/ML injection Inject 0.12 mLs (12 Units total) into the skin at bedtime. 07/09/22   Rema Fendt, NP  insulin lispro (HUMALOG) 100 UNIT/ML injection Inject 1-7 Units into the skin See admin instructions. Per sliding scale 3 times daily with meals 151-200= 1 unit 201-250= 2 units 251-300= 3 units 301-350= 5 units 351-400= 7 units Greater than 400 call md takes only as needed if blood sugar is over 200    [provider]  Insulin Pen Needle (PEN NEEDLES) 31G X 8 MM MISC UAD 07/09/22   Rema Fendt, NP  Multiple Vitamins-Minerals (MULTIVITAMIN WITH MINERALS) tablet Take 1 tablet by mouth daily.    [provider]  multivitamin (RENA-VIT) TABS tablet Take 1 tablet by mouth at  bedtime. Patient not taking: Reported on 10/30/2021 11/27/20   [name], MD  ondansetron (ZOFRAN) 4 MG tablet Take 4 mg by mouth every 8 (eight) hours as needed for nausea or vomiting. 04/12/21   [provider]  prednisoLONE acetate (PRED FORTE) 1 % ophthalmic suspension Place 1 drop into the left eye See admin instructions. 2-3 times daily 10/15/21   [provider]  sevelamer carbonate (RENVELA) 800 MG tablet Take 2 tablets (1,600 mg total) by mouth 3 (three) times daily with meals. Patient not taking: Reported on 12/29/2021 03/05/21   Russella Dar, NP    Physical Exam: Vitals:   10/26/22 1115 10/26/22 1130 10/26/22 1250 10/26/22 1300  BP: (!) 167/100 (!) 170/103 (!) 168/103 (!) 181/114  Pulse: 80 81 84 88  Resp: Temp:  TempSrc:      SpO2: 99% 96% 99% 93%    Physical Exam Constitutional:      General: He is not in acute distress.    Appearance: Normal appearance.  HENT:     Head: Normocephalic and atraumatic.     Mouth/Throat:     Mouth: Mucous membranes are moist.     Pharynx: Oropharynx is clear.  Eyes:     Extraocular Movements: Extraocular movements intact.     Pupils: Pupils are equal, round, and reactive to light.  Cardiovascular:     Rate and Rhythm: Normal rate and regular rhythm.     Pulses: Normal pulses.     Heart sounds: Normal heart sounds.  Pulmonary:     Effort: Pulmonary effort is normal. No respiratory distress.     Breath sounds: Normal breath sounds.  Abdominal:     General: Bowel sounds are normal. There is no distension.     Palpations: Abdomen is soft.     Tenderness: There is no abdominal tenderness.  Musculoskeletal:        General: No swelling or deformity.  Skin:    General: Skin is warm and dry.  Neurological:     General: No focal deficit present.     Mental Status: Mental status is at baseline.     Labs on Admission: I have personally reviewed following labs and imaging studies  CBC: Recent  Labs  Lab 10/24/22 1428 10/26/22 1120  WBC 7.6 5.1  NEUTROABS 6.9 3.1  HGB 10.5* 9.1*  HCT 34.0* 28.3*  MCV 81.5 78.8*  PLT 230 158    Basic Metabolic Panel: Recent Labs  Lab 10/24/22 1428 10/26/22 1120  NA 132* 131*  K 3.6 3.7  CL 92* 92*  CO2 23 22  GLUCOSE 122* 106*  BUN 31* 57*  CREATININE 10.51* 16.64*  CALCIUM 8.0* 6.9*    GFR: Estimated Creatinine Clearance: 6.7 mL/min (A) (by C-G formula based on SCr of 16.64 mg/dL (H)).  Liver Function Tests: Recent Labs  Lab 10/24/22 1428 10/26/22 1120  AST 15 13*  ALT 11 19  ALKPHOS 55 46  BILITOT 0.9 0.6  PROT 7.8 7.3  ALBUMIN 3.6 3.5    Urine analysis:    Component Value Date/Time   COLORURINE YELLOW 11/25/2020 0301   APPEARANCEUR CLEAR 11/25/2020 0301   LABSPEC 1.016 11/25/2020 0301   PHURINE 7.0 11/25/2020 0301   GLUCOSEU 50 (A) 11/25/2020 0301   HGBUR SMALL (A) 11/25/2020 0301   BILIRUBINUR NEGATIVE 11/25/2020 0301   KETONESUR NEGATIVE 11/25/2020 0301   PROTEINUR >=300 (A) 11/25/2020 0301   UROBILINOGEN 0.2 07/16/2014 1747   NITRITE NEGATIVE 11/25/2020 0301   LEUKOCYTESUR NEGATIVE 11/25/2020 0301    Radiological Exams on Admission: DG Chest 2 View  Result Date: 10/26/2022 CLINICAL DATA:  Shortness of breath EXAM: CHEST - 2 VIEW COMPARISON:  Chest x-ray 10/24/2022 and older FINDINGS: Stable right IJ large-bore catheter with tip at the SVC right atrial junction region. Enlarged cardiopericardial silhouette with some vascular congestion and trace edema, increased from previous. No pneumothorax, effusion or consolidation. Overlapping cardiac leads. IMPRESSION: Developing vascular congestion and trace edema.  Right IJ line. Electronically Signed   By: Karen Kays M.D.   On: 10/26/2022 12:09   US Abdomen Limited RUQ (LIVER/GB)  Result Date: 10/24/2022 CLINICAL DATA:  Epigastric pain. EXAM: ULTRASOUND ABDOMEN LIMITED RIGHT UPPER QUADRANT COMPARISON:  CT abdomen pelvis dated March 11, 2021. FINDINGS:  Gallbladder: No gallstones or wall thickening visualized. No sonographic  Murphy sign noted by sonographer. Common bile duct: Diameter: 4 mm, normal. Liver: No focal lesion identified. Increased parenchymal echogenicity. Portal vein is patent on color Doppler imaging with normal direction of blood flow towards the liver. Other: None. IMPRESSION: 1. No acute abnormality. 2. Hepatic steatosis. Electronically Signed   By: Obie Dredge M.D.   On: 10/24/2022 17:28   DG Chest 2 View  Result Date: 10/24/2022 CLINICAL DATA:  Fever, vomiting and flu-like symptoms since Friday EXAM: CHEST - 2 VIEW COMPARISON:  Chest x-ray dated 10/04/2022. FINDINGS: Stable cardiomegaly. Lungs are clear. No pleural effusion or pneumothorax is seen. Osseous structures about the chest are unremarkable. IMPRESSION: No active cardiopulmonary disease. No evidence of pneumonia or pulmonary edema. Stable cardiomegaly. Electronically Signed   By: Bary Richard M.D.   On: 10/24/2022 15:14    EKG: Not obtained this admission.  Assessment/Plan Principal Problem:   MSSA bacteremia Active Problems:   Anemia   Essential hypertension   Type 2 diabetes mellitus with ESRD (end-stage renal disease)   ESRD (end stage renal disease) on dialysis   Hemodialysis catheter infection   Bacteremia > Patient presenting after blood cultures from recent ED visit came back positive in 2 bottles for MSSA. > Overall labs remain reassuring.  Repeat cultures obtained in the ED.  Echocardiogram has been ordered.  Started on cefazolin. > Appears ID has been also consulted due to having positive blood cultures. > In March, did have tunneled dialysis catheter placed after his fistula clotted and he declined thrombectomy. Since has been addressed and has used fistula x 2 weeks. - Appreciate any ID recommendations - Continue with cefazolin - Follow-up echocardiogram - Follow-up repeat blood cultures - Trend fever curve and WBC - Will need TDC  removal  ESRD on HD > TTS schedule.  Last dialysis session was Saturday, is due today. > Has been using fistula x2 weeks. TDC to come out, especially with bacteremia. - Nephrology consult for dialysis - Trend renal function and electrolytes  Hypertension - Continue home clonidine and carvedilol  Diabetes - SSI  Anemia > Hemoglobin 9.1 which is down from baseline of 10 but may be mildly volume overloaded. - Trend CBC  DVT prophylaxis: Heparin Code Status:   Full Family Communication:  None on admission  Disposition Plan:   Patient is from:  Home  Anticipated DC to:  Home  Anticipated DC date:  2 to 3 days  Anticipated DC barriers: Possible need for OPAT  Consults called:  Nephrology, Appears infectious disease was also consulted Admission status:  Inpatient, telemetry  Severity of Illness: The appropriate patient status for this patient is INPATIENT. Inpatient status is judged to be reasonable and necessary in order to provide the required intensity of service to ensure the patient's safety. The patient's presenting symptoms, physical exam findings, and initial radiographic and laboratory data in the context of their chronic comorbidities is felt to place them at high risk for further clinical deterioration. Furthermore, it is not anticipated that the patient will be medically stable for discharge from the hospital within 2 midnights of admission.   * I certify that at the point of admission it is my clinical judgment that the patient will require inpatient hospital care spanning beyond 2 midnights from the point of admission due to high intensity of service, high risk for further deterioration and high frequency of surveillance required.Synetta Fail MD [facility]  How to contact the [facility] Attending or Consulting provider 7A -  7P or covering provider during after hours 7P -7A, for this patient?   Check the care team in [facility] and look for a) attending/consulting  TR[facility] provider listed and b) the [facility] team listed Log into [email or web address] and use [place] universal password to access. If you do not have the password, please contact the hospital operator. Locate the [facility] provider you are looking for under [facility] and page to a number that you can be directly reached. If you still have difficulty reaching the provider, please page the [facility] (Director on Call) for the Hospitalists listed on amion for assistance.  10/26/2022, 1:17 PM

## 2022-10-26 NOTE — ED Triage Notes (Signed)
Pt returns to hospital after blood cultures resulted positive from Sunday. Endorses improvement from symptoms he had this weekend but still endorses lack of appetite and feeling generally unwell. T, Th, S dialysis patient, last treatment on Saturday.

## 2022-10-26 NOTE — Telephone Encounter (Signed)
Called [facility] in [place].  Informed Candace RN that he has a positive blood culture from 4/14 for MSSA.   Cultures were drawn during an ED visit.  Patient was present for dialysis and she would alert the physician on call of result.

## 2022-10-26 NOTE — ED Notes (Signed)
Report given to dialysis nurse.

## 2022-10-26 NOTE — ED Notes (Signed)
ED TO INPATIENT HANDOFF REPORT  ED Nurse Name and Phone #:   S Name/Age/Gender [name] 37 y.o. male Room/Bed: 024C/024C  Code Status   Code Status: Full Code  Home/SNF/Other Home Patient oriented to: self, place, time, and situation Is this baseline? Yes   Triage Complete: Triage complete  Chief Complaint Bacteremia [R78.81]  Triage Note Pt returns to hospital after blood cultures resulted positive from Sunday. Endorses improvement from symptoms he had this weekend but still endorses lack of appetite and feeling generally unwell. T, Th, S dialysis patient, last treatment on Saturday.    Allergies Allergies  Allergen Reactions   Amlodipine Nausea And Vomiting and Other (See Comments)    Patient was taking Amlodipine and Hydralazine at the same time, so the reactions came from one of the 2: Lethargy and an all-over feeling of NOT feeling well (also)   Hydralazine Nausea And Vomiting and Other (See Comments)    Patient was taking Hydralazine AND Amlodipine at the same time, so the reactions came from one of the 2: Lethargy and an all-over feeling of NOT feeling well (also)    Level of Care/Admitting Diagnosis ED Disposition     ED Disposition  Admit   Condition  --   Comment  Hospital Area: [facility] [[id number]]  Level of Care: Telemetry Medical [104]  May admit patient to [facility] or [facility] if equivalent level of care is available:: No  Covid Evaluation: Asymptomatic - no recent exposure (last 10 days) testing not required  Diagnosis: Bacteremia [790.7.ICD-9-CM]  Admitting Physician: Synetta Fail [[id number]]  Attending Physician: Synetta Fail [[id number]]  Certification:: I certify this patient will need inpatient services for at least 2 midnights  Estimated Length of Stay: 2          B Medical/Surgery History Past Medical History:  Diagnosis Date   ADHD (attention deficit hyperactivity disorder)    Diabetes mellitus     ESRD on hemodialysis    [facility] [place] [facility]   High cholesterol    Hypertension    Hypertrophic cardiomyopathy 02/26/2021   Seizure 02/08/2021   Past Surgical History:  Procedure Laterality Date   AMPUTATION TOE Right 12/29/2021   Procedure: AMPUTATION TOE, fifth;  Surgeon: Louann Sjogren, DPM;  Location: [facility] OR;  Service: Podiatry;  Laterality: Right;  surgical team will do local block   AV FISTULA PLACEMENT Left 08/11/2020   Procedure: LEFT UPPER EXTREMITY ARTERIOVENOUS (AV) FISTULA CREATION;  Surgeon: Leonie Douglas, MD;  Location: [facility] OR;  Service: Vascular;  Laterality: Left;   FISTULA SUPERFICIALIZATION Left 12/02/2021   Procedure: PLICATION OF LEFT RADIUS CEPHALIC FISTULA;  Surgeon: Leonie Douglas, MD;  Location: [facility] OR;  Service: Vascular;  Laterality: Left;  PERIPHERAL NERVE BLOCK   FRACTURE SURGERY     I & D EXTREMITY Left 07/16/2014   Procedure: IRRIGATION AND DEBRIDEMENT EXTREMITY/LEFT INDEX FINGER;  Surgeon: Betha Loa, MD;  Location: [facility] OR;  Service: Orthopedics;  Laterality: Left;   IR FLUORO GUIDE CV LINE RIGHT  10/04/2022   IR FLUORO GUIDE CV LINE RIGHT  10/05/2022   IR PERC TUN PERIT CATH WO PORT S&I /IMAG  08/07/2020   IR THROMBECTOMY AV FISTULA W/THROMBOLYSIS/PTA INC/SHUNT/IMG LEFT Left 10/05/2022   IR US GUIDE VASC ACCESS RIGHT  08/07/2020   IR US GUIDE VASC ACCESS RIGHT  10/04/2022   IR US GUIDE VASC ACCESS RIGHT  10/05/2022     A IV Location/Drains/Wounds Patient Lines/Drains/Airways Status  Active Line/Drains/Airways     Name Placement date Placement time Site Days   Peripheral IV 10/26/22 18 G Anterior;Right Forearm 10/26/22  1121  Forearm  less than 1   Peripheral IV 10/26/22 20 G Anterior;Proximal;Right Forearm 10/26/22  1127  Forearm  less than 1   Fistula / Graft Left Forearm Arteriovenous fistula 08/11/20  0839  Forearm  806   Fistula / Graft Left Upper arm --  --  Upper arm  --   Fistula / Graft Left Forearm --  --  Forearm  --   Hemodialysis  Catheter Right Internal jugular Double lumen Permanent (Tunneled) 10/05/22  1429  Internal jugular  21   Wound / Incision (Open or Dehisced) 05/08/20 Non-pressure wound Foot Left;Anterior 05/08/20  --  Foot  901   Wound / Incision (Open or Dehisced) 05/08/20 Non-pressure wound Foot Right 05/08/20  --  Foot  901   Wound / Incision (Open or Dehisced) 05/08/20 Non-pressure wound Pretibial Distal;Left Open or Dehisced 05/08/20  1847  Pretibial  901   Wound / Incision (Open or Dehisced) 11/25/20 Non-pressure wound Toe (Comment  which one) Anterior;Right;Posterior 11/25/20  1935  Toe (Comment  which one)  700   Wound / Incision (Open or Dehisced) 02/11/21 Non-pressure wound Toe (Comment  which one) Anterior;Right Bleeding 02/11/21  0800  Toe (Comment  which one)  622   Wound / Incision (Open or Dehisced) 02/11/21 Non-pressure wound Toe (Comment  which one) Anterior;Left bleeding, open 02/11/21  0800  Toe (Comment  which one)  622   Wound / Incision (Open or Dehisced) 10/30/21 Diabetic ulcer Toe (Comment  which one) Right 10/30/21  2200  Toe (Comment  which one)  361            Intake/Output Last 24 hours  Intake/Output Summary (Last 24 hours) at 10/26/2022 1442 Last data filed at 10/26/2022 1323 Gross per 24 hour  Intake 3 ml  Output --  Net 3 ml    Labs/Imaging Results for orders placed or performed during the hospital encounter of 10/26/22 (from the past 48 hour(s))  CBC with Differential     Status: Abnormal   Collection Time: 10/26/22 11:20 AM  Result Value Ref Range   WBC 5.1 4.0 - 10.5 K/uL   RBC 3.59 (L) 4.22 - 5.81 MIL/uL   Hemoglobin 9.1 (L) 13.0 - 17.0 g/dL   HCT 16.1 (L) 09.6 - 04.5 %   MCV 78.8 (L) 80.0 - 100.0 fL   MCH 25.3 (L) 26.0 - 34.0 pg   MCHC 32.2 30.0 - 36.0 g/dL   RDW 40.9 (H) 81.1 - 91.4 %   Platelets 158 150 - 400 K/uL   nRBC 0.0 0.0 - 0.2 %   Neutrophils Relative % 63 %   Neutro Abs 3.1 1.7 - 7.7 K/uL   Lymphocytes Relative 12 %   Lymphs Abs 0.6 (L) 0.7 -  4.0 K/uL   Monocytes Relative 23 %   Monocytes Absolute 1.2 (H) 0.1 - 1.0 K/uL   Eosinophils Relative 2 %   Eosinophils Absolute 0.1 0.0 - 0.5 K/uL   Basophils Relative 0 %   Basophils Absolute 0.0 0.0 - 0.1 K/uL   Immature Granulocytes 0 %   Abs Immature Granulocytes 0.02 0.00 - 0.07 K/uL    Comment: Performed at [facility], [address] [place], [place] [address]  Comprehensive metabolic panel     Status: Abnormal   Collection Time: 10/26/22 11:20 AM  Result Value Ref  Range   Sodium 131 (L) 135 - 145 mmol/L   Potassium 3.7 3.5 - 5.1 mmol/L   Chloride 92 (L) 98 - 111 mmol/L   CO2 22 22 - 32 mmol/L   Glucose, Bld 106 (H) 70 - 99 mg/dL    Comment: Glucose reference range applies only to samples taken after fasting for at least 8 hours.   BUN 57 (H) 6 - 20 mg/dL   Creatinine, Ser 16.10 (H) 0.61 - 1.24 mg/dL    Comment: DELTA CHECK NOTED REPEATED TO VERIFY    Calcium 6.9 (L) 8.9 - 10.3 mg/dL   Total Protein 7.3 6.5 - 8.1 g/dL   Albumin 3.5 3.5 - 5.0 g/dL   AST 13 (L) 15 - 41 U/L   ALT 19 0 - 44 U/L   Alkaline Phosphatase 46 38 - 126 U/L   Total Bilirubin 0.6 0.3 - 1.2 mg/dL   GFR, Estimated 3 (L) >60 mL/min    Comment: (NOTE) Calculated using the CKD-EPI Creatinine Equation (2021)    Anion gap 17 (H) 5 - 15    Comment: Performed at [facility], [address] [place], [place] [address]   DG Chest 2 View  Result Date: 10/26/2022 CLINICAL DATA:  Shortness of breath EXAM: CHEST - 2 VIEW COMPARISON:  Chest x-ray 10/24/2022 and older FINDINGS: Stable right IJ large-bore catheter with tip at the SVC right atrial junction region. Enlarged cardiopericardial silhouette with some vascular congestion and trace edema, increased from previous. No pneumothorax, effusion or consolidation. Overlapping cardiac leads. IMPRESSION: Developing vascular congestion and trace edema.  Right IJ line. Electronically Signed   By: Karen Kays M.D.   On: 10/26/2022 12:09   US Abdomen  Limited RUQ (LIVER/GB)  Result Date: 10/24/2022 CLINICAL DATA:  Epigastric pain. EXAM: ULTRASOUND ABDOMEN LIMITED RIGHT UPPER QUADRANT COMPARISON:  CT abdomen pelvis dated March 11, 2021. FINDINGS: Gallbladder: No gallstones or wall thickening visualized. No sonographic Murphy sign noted by sonographer. Common bile duct: Diameter: 4 mm, normal. Liver: No focal lesion identified. Increased parenchymal echogenicity. Portal vein is patent on color Doppler imaging with normal direction of blood flow towards the liver. Other: None. IMPRESSION: 1. No acute abnormality. 2. Hepatic steatosis. Electronically Signed   By: Obie Dredge M.D.   On: 10/24/2022 17:28   DG Chest 2 View  Result Date: 10/24/2022 CLINICAL DATA:  Fever, vomiting and flu-like symptoms since Friday EXAM: CHEST - 2 VIEW COMPARISON:  Chest x-ray dated 10/04/2022. FINDINGS: Stable cardiomegaly. Lungs are clear. No pleural effusion or pneumothorax is seen. Osseous structures about the chest are unremarkable. IMPRESSION: No active cardiopulmonary disease. No evidence of pneumonia or pulmonary edema. Stable cardiomegaly. Electronically Signed   By: Bary Richard M.D.   On: 10/24/2022 15:14    Pending Labs Unresulted Labs (From admission, onward)     Start     Ordered   10/27/22 0500  CBC  Tomorrow morning,   R        10/26/22 1314   10/27/22 0500  Renal function panel  Tomorrow morning,   R        10/26/22 1314   10/26/22 1401  Hepatitis B surface antigen  (New Admission Hemo Labs (Hepatitis B))  Once,   R        10/26/22 1401   10/26/22 1401  Hepatitis B surface antibody,quantitative  (New Admission Hemo Labs (Hepatitis B))  Once,   R        10/26/22 1401  10/26/22 1101  Blood culture (routine x 2)  BLOOD CULTURE X 2,   R (with STAT occurrences)      10/26/22 1100            Vitals/Pain Today's Vitals   10/26/22 1250 10/26/22 1300 10/26/22 1330 10/26/22 1400  BP: (!) 168/103 (!) 181/114 (!) 167/104 (!) 155/102  Pulse: 84  88 84 83  Resp: Temp:      TempSrc:      SpO2: 99% 93% 94% 95%  PainSc:        Isolation Precautions No active isolations  Medications Medications  ceFAZolin (ANCEF) IVPB 1 g/50 mL premix (has no administration in time range)  heparin injection 5,000 Units (5,000 Units Subcutaneous Given 10/26/22 1323)  sodium chloride flush (NS) 0.9 % injection 3 mL (3 mLs Intravenous Given 10/26/22 1323)  acetaminophen (TYLENOL) tablet 650 mg (has no administration in time range)    Or  acetaminophen (TYLENOL) suppository 650 mg (has no administration in time range)  polyethylene glycol (MIRALAX / GLYCOLAX) packet 17 g (has no administration in time range)  carvedilol (COREG) tablet 25 mg (has no administration in time range)  cloNIDine (CATAPRES) tablet 0.1 mg (has no administration in time range)  Chlorhexidine Gluconate Cloth 2 % PADS 6 each (has no administration in time range)  (feeding supplement) PROSource Plus liquid 30 mL (has no administration in time range)  calcitRIOL (ROCALTROL) capsule 1 mcg (has no administration in time range)  ceFAZolin (ANCEF) IVPB 1 g/50 mL premix (0 g Intravenous Stopped 10/26/22 1321)    Mobility walks     Focused Assessments Pt reports feeling fatigued, pt is easy to arouse.    R Recommendations: See Admitting Provider Note  Report given to:   Additional Notes: Dialysis - fistula left arm  T R Sat dialysis - full session on saturday

## 2022-10-26 NOTE — Progress Notes (Signed)
Pharmacy Antibiotic Note  [name] is a 37 y.o. male admitted on 10/26/2022 after being called back for admission with 4/14 blood cultures showing MSSA. Pharmacy has been consulted for Cefazolin.  ESRD patient who originally presented to the ED on 4/14 with abd pain, N/V, low grade fever. 3 of 4 blood culture at that time are showing MSSA. Noted RIJ-TDC  Normal HD schedule is TTS however last HD was noted on Sat, 4/13 as the patient was directed for admission today from his HD center and did not receive dialysis. Assuming his [facility] will need to be removed/replaced and likely will get off of his HD schedule - so will opt for daily dosing for now.   Plan: - Cefazolin 1g IV x 1 dose now - Start Cefazolin 1g IV every 24 hours in the evening (after HD on HD days) - Will continue to follow HD schedule/duration, culture results, LOT, and antibiotic de-escalation plans      Temp (24hrs), Avg:99.2 F (37.3 C), Min:99.2 F (37.3 C), Max:99.2 F (37.3 C)  Recent Labs  Lab 10/24/22 1420 10/24/22 1428 10/26/22 1120  WBC  --  7.6 5.1  CREATININE  --  10.51*  --   LATICACIDVEN 1.4  --   --     Estimated Creatinine Clearance: 10.6 mL/min (A) (by C-G formula based on SCr of 10.51 mg/dL (H)).    Allergies  Allergen Reactions   Amlodipine Nausea And Vomiting and Other (See Comments)    Patient was taking Amlodipine and Hydralazine at the same time, so the reactions came from one of the 2: Lethargy and an all-over feeling of NOT feeling well (also)   Hydralazine Nausea And Vomiting and Other (See Comments)    Patient was taking Hydralazine AND Amlodipine at the same time, so the reactions came from one of the 2: Lethargy and an all-over feeling of NOT feeling well (also)    Antimicrobials this admission: Cefazolin 4/16 >>   Microbiology results: 4/14 BCx >> 3/4 MSSA  Thank you for allowing pharmacy to be a part of this patient's care.  Georgina Pillion, PharmD, BCPS Infectious  Diseases Clinical Pharmacist 10/26/2022 12:39 PM   **Pharmacist phone directory can now be found on [email or web address] ([email or web address]).  Listed under [facility].

## 2022-10-26 NOTE — ED Provider Notes (Signed)
[place] EMERGENCY DEPARTMENT AT [facility] Provider Note   CSN: [id number] Arrival date & time: 10/26/22  1016     History  Chief Complaint  Patient presents with   Abnormal Lab    [name] is a 37 y.o. male presenting for bacteremia  Pt states last week, about 5 days ago, he had fevers, nausea, vomiting and abd pain. He was seen in the ED, had reassuring blood work, a negative [place], and felt better and wqas d/c. Today, he received the results of his blood cultures, which showed MSSA. As such, he was told to come back to the ED>   Today he states his abd pain, n/v/ are much better. He conties to feel unwell, but not as bad as before. He has been able to tolerate some food. He denies cp, cough, change in urine, and change in stool. He has some mild chest tightness. He was able to tolerate his HD (T/TH/Sa) without difficulty. He was supposed to get his perm cath removed in clinic, but appt was cancelled so it has remained in  HPI     Home Medications Prior to Admission medications   Medication Sig Start Date End Date Taking? Authorizing Provider  acetaminophen (TYLENOL) 325 MG tablet Take 2 tablets (650 mg total) by mouth every 6 (six) hours as needed for mild pain (or Fever >/= 101). 12/30/21   Rhetta Mura, MD  atorvastatin (LIPITOR) 40 MG tablet Take 40 mg by mouth daily.    [provider]  B Complex-C-Folic Acid (RENA-VITE RX) 1 MG TABS Take 1 tablet by mouth daily. 06/28/22   [provider]  calcitRIOL (ROCALTROL) 0.5 MCG capsule Take 2 capsules (1 mcg total) by mouth Every Tuesday,Thursday,and Saturday with dialysis. 12/31/21   Rhetta Mura, MD  carvedilol (COREG) 25 MG tablet Take 1 tablet (25 mg total) by mouth 2 (two) times daily with a meal. 03/05/21   Russella Dar, NP  cloNIDine (CATAPRES) 0.1 MG tablet Take 0.1 mg by mouth 2 (two) times daily. 04/20/22   [provider]  insulin glargine-yfgn (SEMGLEE) 100  UNIT/ML injection Inject 0.12 mLs (12 Units total) into the skin at bedtime. 07/09/22   Rema Fendt, NP  insulin lispro (HUMALOG) 100 UNIT/ML injection Inject 1-7 Units into the skin See admin instructions. Per sliding scale 3 times daily with meals 151-200= 1 unit 201-250= 2 units 251-300= 3 units 301-350= 5 units 351-400= 7 units Greater than 400 call md takes only as needed if blood sugar is over 200    [provider]  Insulin Pen Needle (PEN NEEDLES) 31G X 8 MM MISC UAD 07/09/22   Rema Fendt, NP  Multiple Vitamins-Minerals (MULTIVITAMIN WITH MINERALS) tablet Take 1 tablet by mouth daily.    [provider]  multivitamin (RENA-VIT) TABS tablet Take 1 tablet by mouth at bedtime. Patient not taking: Reported on 10/30/2021 11/27/20   [name], MD  ondansetron (ZOFRAN) 4 MG tablet Take 4 mg by mouth every 8 (eight) hours as needed for nausea or vomiting. 04/12/21   [provider]  prednisoLONE acetate (PRED FORTE) 1 % ophthalmic suspension Place 1 drop into the left eye See admin instructions. 2-3 times daily 10/15/21   [provider]  sevelamer carbonate (RENVELA) 800 MG tablet Take 2 tablets (1,600 mg total) by mouth 3 (three) times daily with meals. Patient not taking: Reported on 12/29/2021 03/05/21   Russella Dar, NP  Allergies    Amlodipine and Hydralazine    Review of Systems   Review of Systems  Constitutional:  Positive for fever (resolved).  Respiratory:  Positive for chest tightness.   Gastrointestinal:  Positive for abdominal pain (resolved), diarrhea (resolved), nausea (resolved) and vomiting (resolved).  Neurological:  Positive for weakness.  All other systems reviewed and are negative.   Physical Exam Updated Vital Signs BP (!) 155/102   Pulse 83   Temp 99.2 F (37.3 C) (Oral)   Resp 16   SpO2 95%  Physical Exam Vitals and nursing note reviewed.  Constitutional:      General: He is not in acute distress.     Appearance: Normal appearance.     Comments: nontoxic  HENT:     Head: Normocephalic and atraumatic.  Eyes:     Conjunctiva/sclera: Conjunctivae normal.     Pupils: Pupils are equal, round, and reactive to light.  Cardiovascular:     Rate and Rhythm: Normal rate and regular rhythm.     Pulses: Normal pulses.     Comments: LUE AV fistula with good thrill Pulmonary:     Effort: Pulmonary effort is normal. No respiratory distress.     Breath sounds: Normal breath sounds. No wheezing.     Comments: Speaking in full sentences.  Clear lung sounds in all fields. Abdominal:     General: There is no distension.     Palpations: Abdomen is soft. There is no mass.     Tenderness: There is no abdominal tenderness. There is no guarding or rebound.  Musculoskeletal:        General: Normal range of motion.     Cervical back: Normal range of motion and neck supple.  Skin:    General: Skin is warm and dry.     Capillary Refill: Capillary refill takes less than 2 seconds.  Neurological:     Mental Status: He is alert and oriented to person, place, and time.  Psychiatric:        Mood and Affect: Mood and affect normal.        Speech: Speech normal.        Behavior: Behavior normal.     ED Results / Procedures / Treatments   Labs (all labs ordered are listed, but only abnormal results are displayed) Labs Reviewed  CBC WITH DIFFERENTIAL/PLATELET - Abnormal; Notable for the following components:      Result Value   RBC 3.59 (*)    Hemoglobin 9.1 (*)    HCT 28.3 (*)    MCV 78.8 (*)    MCH 25.3 (*)    RDW 19.9 (*)    Lymphs Abs 0.6 (*)    Monocytes Absolute 1.2 (*)    All other components within normal limits  COMPREHENSIVE METABOLIC PANEL - Abnormal; Notable for the following components:   Sodium 131 (*)    Chloride 92 (*)    Glucose, Bld 106 (*)    BUN 57 (*)    Creatinine, Ser 16.64 (*)    Calcium 6.9 (*)    AST 13 (*)    GFR, Estimated 3 (*)    Anion gap 17 (*)    All other  components within normal limits  CULTURE, BLOOD (ROUTINE X 2)  CULTURE, BLOOD (ROUTINE X 2)  HEPATITIS B SURFACE ANTIGEN  HEPATITIS B SURFACE ANTIBODY, QUANTITATIVE    EKG None  Radiology DG Chest 2 View  Result Date: 10/26/2022 CLINICAL DATA:  Shortness of breath EXAM: CHEST -  2 VIEW COMPARISON:  Chest x-ray 10/24/2022 and older FINDINGS: Stable right IJ large-bore catheter with tip at the SVC right atrial junction region. Enlarged cardiopericardial silhouette with some vascular congestion and trace edema, increased from previous. No pneumothorax, effusion or consolidation. Overlapping cardiac leads. IMPRESSION: Developing vascular congestion and trace edema.  Right IJ line. Electronically Signed   By: Karen Kays M.D.   On: 10/26/2022 12:09   US Abdomen Limited RUQ (LIVER/GB)  Result Date: 10/24/2022 CLINICAL DATA:  Epigastric pain. EXAM: ULTRASOUND ABDOMEN LIMITED RIGHT UPPER QUADRANT COMPARISON:  CT abdomen pelvis dated March 11, 2021. FINDINGS: Gallbladder: No gallstones or wall thickening visualized. No sonographic Murphy sign noted by sonographer. Common bile duct: Diameter: 4 mm, normal. Liver: No focal lesion identified. Increased parenchymal echogenicity. Portal vein is patent on color Doppler imaging with normal direction of blood flow towards the liver. Other: None. IMPRESSION: 1. No acute abnormality. 2. Hepatic steatosis. Electronically Signed   By: Obie Dredge M.D.   On: 10/24/2022 17:28   DG Chest 2 View  Result Date: 10/24/2022 CLINICAL DATA:  Fever, vomiting and flu-like symptoms since Friday EXAM: CHEST - 2 VIEW COMPARISON:  Chest x-ray dated 10/04/2022. FINDINGS: Stable cardiomegaly. Lungs are clear. No pleural effusion or pneumothorax is seen. Osseous structures about the chest are unremarkable. IMPRESSION: No active cardiopulmonary disease. No evidence of pneumonia or pulmonary edema. Stable cardiomegaly. Electronically Signed   By: Bary Richard M.D.   On: 10/24/2022  15:14    Procedures Procedures    Medications Ordered in ED Medications  ceFAZolin (ANCEF) IVPB 1 g/50 mL premix (has no administration in time range)  heparin injection 5,000 Units (5,000 Units Subcutaneous Given 10/26/22 1323)  sodium chloride flush (NS) 0.9 % injection 3 mL (3 mLs Intravenous Given 10/26/22 1323)  acetaminophen (TYLENOL) tablet 650 mg (has no administration in time range)    Or  acetaminophen (TYLENOL) suppository 650 mg (has no administration in time range)  polyethylene glycol (MIRALAX / GLYCOLAX) packet 17 g (has no administration in time range)  carvedilol (COREG) tablet 25 mg (has no administration in time range)  cloNIDine (CATAPRES) tablet 0.1 mg (has no administration in time range)  Chlorhexidine Gluconate Cloth 2 % PADS 6 each (has no administration in time range)  ceFAZolin (ANCEF) IVPB 1 g/50 mL premix (0 g Intravenous Stopped 10/26/22 1321)    ED Course/ Medical Decision Making/ A&P                             Medical Decision Making Amount and/or Complexity of Data Reviewed Labs: ordered. Radiology: ordered.  Risk Prescription drug management. Decision regarding hospitalization.    This patient presents to the ED for concern of bacteremia. This involves a number of treatment options, and is a complaint that carries with it a high risk of complications and morbidity.  The differential diagnosis includes acute bacteria infection from PNA, intraabdominal source, perm cath line infection   Co morbidities:  ESRD on HD, currently with perm cath.    Additional history: Reviewed recent ED visit, including labs and imaging    Lab Tests:  I ordered, and personally interpreted labs.  The pertinent results include:  no leukocytosis. Electrolytes similar to a few days ago. Reviewed blood culure results, MSSA positive. Repeat cultures ordered   Imaging Studies:  I ordered imaging studies including cxr I independently visualized and interpreted  imaging which showed mild congestion/edema I agree with the radiologist  interpretation   Medicines ordered:  I ordered medication including ancef for bacteremia   Consults:  I requested consultation with the hosptialist,  and discussed lab and imaging findings as well as pertinent plan - they recommend admission to their service   Disposition:  After consideration of the diagnostic results and the patients response to treatment, I feel that the patent would benefit from inpatient management of his bacteremia, especially in the setting of perm cath and ESRD on HD. Abx started until repeat cx return. Discussed plan with pt, who is agreeable.           Final Clinical Impression(s) / ED Diagnoses Final diagnoses:  Bacteremia    Rx / DC Orders ED Discharge Orders     None         Alveria Apley, PA-C 10/26/22 1435    Wynetta Fines, MD 10/26/22 1623

## 2022-10-26 NOTE — Telephone Encounter (Signed)
Encounter opened in error

## 2022-10-26 NOTE — Consult Note (Cosign Needed)
I have seen and examined the patient. I have personally reviewed the clinical findings, laboratory findings, microbiological data and imaging studies. The assessment and treatment plan was discussed with the Nurse Practitioner Rexene Alberts. I agree with her/his recommendations except following additions/corrections.  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 Seen 2 days ago in the ED with nausea, vomiting and feeling unwell.   Febrile in the ED, not much participating in history and exam likely 2/2 malaise and fatigue. Complains of diffuse body pain, non focal   Exam adult male, appears fatigued.            HEENT wnl           Normal heart and lung sounds            Abdomen - non distended and non tender           MSK - no pedal edema. No signs of septic peripheral joint           Neuro - awake, alert and oriented, grossly non focal            Skin - Rt IJ HD catheter with surrounding crusted blood and tender. Left UE AVF with no concerns   Recommend switching zosyn to cefazolin 2 sets of blood cx today  TTE ordered and if negative, needs TEE HD catheter to be removed depending on HD needs per Nephrology  Monitor for metastatic sites of infection  Consult Vascular surgery to re-assess for thrombectomy since patient is admitted.  Monitor CBC and BMP   Odette Fraction, MD Infectious Disease Physician [facility] [address] [place], [place] [address] Phone: [phone]  Fax: [phone]     [facility]    Date of Admission:  10/26/2022     Total days of antibiotics 0  Cefazolin               Reason for Consult: MSSA Bacteremia     Referring Provider: Auto Consult  Primary Care Provider: Rema Fendt, NP    Assessment: [name] is a 37 y.o. male admitted with 4-d history of malaise, N/V, diffuse body pain and fevers. Found to have MSSA growing in blood from 4/14 ER visit and has been called back for admission. Only focal complaint at the time of my assessment is tenderness over the HD catheter. This was placed within the last month. He does not have an occlusive dressing over this and blood is crusted on chest.   Suspect that the source of his acute illness is HD line infection. Would recommend having this removed once HD needs are assessed (he is due for regular HD session today, renal has not seen him yet). He says he has ready to use LUE AVF that is ready to use. Other previous fistula sites noted and unremarkable.  Will need to check back in with him over the next few days to better assess for any thing that would be concerning  for metastatic infections. Repeat bcx drawn, though has not had abx yet and suspect they will grow out MSSA.  TTE please.  Change zosyn to cefazolin.    Plan: Cefazolin IV  TTE please  Recommend HD line removal as soon as nephrology deems able  Please for now have HD line cleaned/ dressed with occlusive dressing    Principal Problem:   MSSA bacteremia Active Problems:   Anemia   Essential hypertension   Type 2 diabetes mellitus with ESRD (end-stage renal disease)   ESRD (end stage renal disease) on dialysis   Hemodialysis catheter infection     HPI: [name] is a 37 y.o. male called back to ER for bacteremia.   Presented to ER on 4/14 with c/o flue like symptoms since Friday 4/12 - fevers, vomiting, diarrhea and dizziness. He is ESRD patient on HD every TTS schedule. Blood work at time of ER encounter revealed normal WBC,  normal lactate, baseline labs. RVP negative. Blood cultures were drawn at this visit and yesterday 4/15 returned positive for MSSA, he was called back for admission and further work up. He has improved slightly since last seen but feels awful. No focal complaints at this time outside of catheter discomfort. Has hardware in left wrist placed 18y ago that is not currently bothering him. 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 I&D of left index fiinger in 2016 as well. NO current wounds present. Has LU extremity AVF that is ready to use per his report.   He has a RIJ HD catheter that was placed about a month ago. Recently has had pain at this site. It is not currently covered with a dressing. Last HD session was Saturday. No cardiac devices.    Review of Systems: Review of Systems  Constitutional:  Positive for diaphoresis, fever and malaise/fatigue.  HENT: Negative.    Respiratory: Negative.    Cardiovascular:  Negative for chest pain.  Gastrointestinal:  Positive for nausea and vomiting. Negative for abdominal pain and diarrhea.  Musculoskeletal:  Positive for myalgias.       Diffuse body pain all over, non-focal  Skin:  Negative for rash.  Neurological:  Positive for dizziness and weakness. Negative for sensory change, focal weakness and headaches.    Past Medical History:  Diagnosis Date   ADHD (attention deficit hyperactivity disorder)    Diabetes mellitus    ESRD on hemodialysis    [facility]   High cholesterol    Hypertension    Hypertrophic cardiomyopathy 02/26/2021   Seizure 02/08/2021   Past Surgical History:  Procedure Laterality Date   AMPUTATION TOE Right 12/29/2021   Procedure: AMPUTATION TOE, fifth;  Surgeon: Louann Sjogren, DPM;  Location: [facility] OR;  Service: Podiatry;  Laterality: Right;  surgical team will do local block   AV FISTULA PLACEMENT Left 08/11/2020   Procedure: LEFT UPPER EXTREMITY  ARTERIOVENOUS (AV) FISTULA CREATION;  Surgeon: Leonie Douglas, MD;  Location: [facility] OR;  Service: Vascular;  Laterality: Left;   FISTULA SUPERFICIALIZATION Left 12/02/2021   Procedure: PLICATION OF LEFT RADIUS CEPHALIC FISTULA;  Surgeon: Leonie Douglas, MD;  Location: [facility] OR;  Service: Vascular;  Laterality: Left;  PERIPHERAL NERVE BLOCK   FRACTURE SURGERY     I & D EXTREMITY Left 07/16/2014   Procedure: IRRIGATION AND DEBRIDEMENT EXTREMITY/LEFT INDEX FINGER;  Surgeon: Betha Loa, MD;  Location: [facility] OR;  Service: Orthopedics;  Laterality: Left;  IR FLUORO GUIDE CV LINE RIGHT  10/04/2022   IR FLUORO GUIDE CV LINE RIGHT  10/05/2022   IR PERC TUN PERIT CATH WO PORT S&I /IMAG  08/07/2020   IR THROMBECTOMY AV FISTULA W/THROMBOLYSIS/PTA INC/SHUNT/IMG LEFT Left 10/05/2022   IR US GUIDE VASC ACCESS RIGHT  08/07/2020   IR US GUIDE VASC ACCESS RIGHT  10/04/2022   IR US GUIDE VASC ACCESS RIGHT  10/05/2022     Social History   Tobacco Use   Smoking status: Never    Passive exposure: Never   Smokeless tobacco: Never  Vaping Use   Vaping Use: Never used  Substance Use Topics   Alcohol use: Not Currently    Comment: social drinker   Drug use: No    Family History  Problem Relation Age of Onset   Cancer Mother    Stroke Father    Diabetes Father    Hypertension Father    Allergies  Allergen Reactions   Amlodipine Nausea And Vomiting and Other (See Comments)    Patient was taking Amlodipine and Hydralazine at the same time, so the reactions came from one of the 2: Lethargy and an all-over feeling of NOT feeling well (also)   Hydralazine Nausea And Vomiting and Other (See Comments)    Patient was taking Hydralazine AND Amlodipine at the same time, so the reactions came from one of the 2: Lethargy and an all-over feeling of NOT feeling well (also)    OBJECTIVE: Blood pressure (!) 168/103, pulse 84, temperature 99.2 F (37.3 C), temperature source Oral, resp. rate 15, SpO2 99 %.  Physical  Exam Constitutional:      Appearance: He is ill-appearing. He is not toxic-appearing.  Eyes:     General: No scleral icterus.    Pupils: Pupils are equal, round, and reactive to light.  Cardiovascular:     Rate and Rhythm: Normal rate and regular rhythm.     Heart sounds: No murmur heard. Pulmonary:     Effort: Pulmonary effort is normal.     Breath sounds: Normal breath sounds.  Abdominal:     General: Bowel sounds are normal. There is no distension.     Tenderness: There is no abdominal tenderness.  Musculoskeletal:        General: Normal range of motion.     Comments: Healed previous amputation site on rt foot.   Skin:    General: Skin is warm and dry.          Comments: HD line sutured in, not covered with dressing. Crusted blood observed. Tender to palpation over catheter site.   Neurological:     Mental Status: He is alert and oriented to person, place, and time.     Lab Results Lab Results  Component Value Date   WBC 5.1 10/26/2022   HGB 9.1 (L) 10/26/2022   HCT 28.3 (L) 10/26/2022   MCV 78.8 (L) 10/26/2022   PLT 158 10/26/2022    Lab Results  Component Value Date   CREATININE 16.64 (H) 10/26/2022   BUN 57 (H) 10/26/2022   NA 131 (L) 10/26/2022   K 3.7 10/26/2022   CL 92 (L) 10/26/2022   CO2 22 10/26/2022    Lab Results  Component Value Date   ALT 19 10/26/2022   AST 13 (L) 10/26/2022   ALKPHOS 46 10/26/2022   BILITOT 0.6 10/26/2022     Microbiology: Recent Results (from the past 240 hour(s))  Culture, blood (Routine x 2)     Status:  Abnormal (Preliminary result)   Collection Time: 10/24/22  2:28 PM   Specimen: BLOOD RIGHT HAND  Result Value Ref Range Status   Specimen Description BLOOD RIGHT HAND  Final   Special Requests   Final    BOTTLES DRAWN AEROBIC AND ANAEROBIC Blood Culture results may not be optimal due to an excessive volume of blood received in culture bottles   Culture  Setup Time   Final    GRAM POSITIVE COCCI IN BOTH AEROBIC AND  ANAEROBIC BOTTLES CRITICAL RESULT CALLED TO, READ BACK BY AND VERIFIED WITH: K MOON,RN@0641  10/25/22 MK    Culture (A)  Final    STAPHYLOCOCCUS AUREUS SUSCEPTIBILITIES TO FOLLOW Performed at [facility], [address], [place], [place] [address]    Report Status PENDING  Incomplete  Blood Culture ID Panel (Reflexed)     Status: Abnormal   Collection Time: 10/24/22  2:28 PM  Result Value Ref Range Status   Enterococcus faecalis NOT DETECTED NOT DETECTED Final   Enterococcus Faecium NOT DETECTED NOT DETECTED Final   Listeria monocytogenes NOT DETECTED NOT DETECTED Final   Staphylococcus species DETECTED (A) NOT DETECTED Final    Comment: CRITICAL RESULT CALLED TO, READ BACK BY AND VERIFIED WITH: K MOON,RN @0641  10/25/22 MK    Staphylococcus aureus (BCID) DETECTED (A) NOT DETECTED Final    Comment: CRITICAL RESULT CALLED TO, READ BACK BY AND VERIFIED WITH: K MOON,RN@0641  10/25/22 MK    Staphylococcus epidermidis NOT DETECTED NOT DETECTED Final   Staphylococcus lugdunensis NOT DETECTED NOT DETECTED Final   Streptococcus species NOT DETECTED NOT DETECTED Final   Streptococcus agalactiae NOT DETECTED NOT DETECTED Final   Streptococcus pneumoniae NOT DETECTED NOT DETECTED Final   Streptococcus pyogenes NOT DETECTED NOT DETECTED Final   A.calcoaceticus-baumannii NOT DETECTED NOT DETECTED Final   Bacteroides fragilis NOT DETECTED NOT DETECTED Final   Enterobacterales NOT DETECTED NOT DETECTED Final   Enterobacter cloacae complex NOT DETECTED NOT DETECTED Final   Escherichia coli NOT DETECTED NOT DETECTED Final   Klebsiella aerogenes NOT DETECTED NOT DETECTED Final   Klebsiella oxytoca NOT DETECTED NOT DETECTED Final   Klebsiella pneumoniae NOT DETECTED NOT DETECTED Final   Proteus species NOT DETECTED NOT DETECTED Final   Salmonella species NOT DETECTED NOT DETECTED Final   Serratia marcescens NOT DETECTED NOT DETECTED Final   Haemophilus influenzae NOT DETECTED NOT DETECTED  Final   Neisseria meningitidis NOT DETECTED NOT DETECTED Final   Pseudomonas aeruginosa NOT DETECTED NOT DETECTED Final   Stenotrophomonas maltophilia NOT DETECTED NOT DETECTED Final   Candida albicans NOT DETECTED NOT DETECTED Final   Candida auris NOT DETECTED NOT DETECTED Final   Candida glabrata NOT DETECTED NOT DETECTED Final   Candida krusei NOT DETECTED NOT DETECTED Final   Candida parapsilosis NOT DETECTED NOT DETECTED Final   Candida tropicalis NOT DETECTED NOT DETECTED Final   Cryptococcus neoformans/gattii NOT DETECTED NOT DETECTED Final   Meth resistant mecA/C and MREJ NOT DETECTED NOT DETECTED Final    Comment: Performed at [facility], [address], [place], [place] [address]  Resp panel by RT-PCR (RSV, Flu A&B, Covid) Anterior Nasal Swab     Status: None   Collection Time: 10/24/22  2:55 PM   Specimen: Anterior Nasal Swab  Result Value Ref Range Status   SARS Coronavirus 2 by RT PCR NEGATIVE NEGATIVE Final   Influenza A by PCR NEGATIVE NEGATIVE Final   Influenza B by PCR NEGATIVE NEGATIVE Final    Comment: (  NOTE) The Xpert Xpress SARS-CoV-2/FLU/RSV plus assay is intended as an aid in the diagnosis of influenza from Nasopharyngeal swab specimens and should not be used as a sole basis for treatment. Nasal washings and aspirates are unacceptable for Xpert Xpress SARS-CoV-2/FLU/RSV testing.  Fact Sheet for Patients: BloggerCourse.com  Fact Sheet for Healthcare Providers: SeriousBroker.it  This test is not yet approved or cleared by the [place] FDA and has been authorized for detection and/or diagnosis of SARS-CoV-2 by FDA under an Emergency Use Authorization (EUA). This EUA will remain in effect (meaning this test can be used) for the duration of the COVID-19 declaration under Section 564(b)(1) of the Act, 21 U.S.C. section 360bbb-3(b)(1), unless the authorization is terminated or revoked.     Resp  Syncytial Virus by PCR NEGATIVE NEGATIVE Final    Comment: (NOTE) Fact Sheet for Patients: BloggerCourse.com  Fact Sheet for Healthcare Providers: SeriousBroker.it  This test is not yet approved or cleared by the [place] FDA and has been authorized for detection and/or diagnosis of SARS-CoV-2 by FDA under an Emergency Use Authorization (EUA). This EUA will remain in effect (meaning this test can be used) for the duration of the COVID-19 declaration under Section 564(b)(1) of the Act, 21 U.S.C. section 360bbb-3(b)(1), unless the authorization is terminated or revoked.  Performed at [facility], [address] [place], [place] [address]   Culture, blood (Routine x 2)     Status: Abnormal (Preliminary result)   Collection Time: 10/24/22  3:40 PM   Specimen: BLOOD RIGHT FOREARM  Result Value Ref Range Status   Specimen Description BLOOD RIGHT FOREARM  Final   Special Requests   Final    BOTTLES DRAWN AEROBIC AND ANAEROBIC Blood Culture adequate volume   Culture  Setup Time   Final    GRAM POSITIVE COCCI IN CLUSTERS ANAEROBIC BOTTLE ONLY Performed at [facility], [address], [place], [place] [address]    Culture STAPHYLOCOCCUS AUREUS (A)  Final   Report Status PENDING  Incomplete   Imaging DG Chest 2 View  Result Date: 10/26/2022 CLINICAL DATA:  Shortness of breath EXAM: CHEST - 2 VIEW COMPARISON:  Chest x-ray 10/24/2022 and older FINDINGS: Stable right IJ large-bore catheter with tip at the SVC right atrial junction region. Enlarged cardiopericardial silhouette with some vascular congestion and trace edema, increased from previous. No pneumothorax, effusion or consolidation. Overlapping cardiac leads. IMPRESSION: Developing vascular congestion and trace edema.  Right IJ line. Electronically Signed   By: Karen Kays M.D.   On: 10/26/2022 12:09      Rexene Alberts, MSN, NP-C [facility]  [email or web address] Pager: [phone] Office: [phone] [facility] Main Line: [phone] *Secure Chat Communication Welcome

## 2022-10-26 NOTE — Consult Note (Signed)
[facility] Renal Consultation Note    Indication for Consultation:  Management of ESRD/hemodialysis, anemia, hypertension/volume, and secondary hyperparathyroidism. PCP:  HPI: [name] is a 37 y.o. male with ESRD, HTN, T2DM who was admitted with bacteremia.  Had been in ED on 4/12 with fever, N/V/D. Labs reassuring, cultured, and sent home with reassurance. Cultures later turned positive for Staph Aureus so he was called and asked to return to the ED for IV antibiotics. GI symptoms have since resolved, he is still feeling run down. Vitals today with T 99.71F, hypertensive. Labs with Na 131, K 3.7, BUN 57, Ca 6.9, Alb 3.5, WBC 5.1, Hgb 9.1. CXR with some early pulmonary edema, repeat Blood Cx drawn. Started on IV Cefazolin. ID consulted - presuming MSSA based on prior Cx.  Seen in ED bed - very sleepy, but otherwise feels ok today.  Has had a lot of dialysis access issues lately - s/p recent admit 3/25-3/28/24 for clotted L AVF, underwent partial declot on 3/25, but with large clot burden so TDC was placed as well. Spoke to his outpatient HD RN - AVF was working well, so [facility] removal planned, but then this was cancelled due to bleeding around needles, but now has been doing ok with heparin dose reduction. He dialyzes on TTS schedule - last on Saturday, he is due today.  Past Medical History:  Diagnosis Date   ADHD (attention deficit hyperactivity disorder)    Diabetes mellitus    ESRD on hemodialysis    [facility]   High cholesterol    Hypertension    Hypertrophic cardiomyopathy 02/26/2021   Seizure 02/08/2021   Past Surgical History:  Procedure Laterality Date   AMPUTATION TOE Right 12/29/2021   Procedure: AMPUTATION TOE, fifth;  Surgeon: Louann Sjogren, DPM;  Location: [facility] OR;  Service: Podiatry;  Laterality: Right;  surgical team will do local block   AV FISTULA PLACEMENT Left 08/11/2020   Procedure: LEFT UPPER EXTREMITY ARTERIOVENOUS (AV) FISTULA  CREATION;  Surgeon: Leonie Douglas, MD;  Location: [facility] OR;  Service: Vascular;  Laterality: Left;   FISTULA SUPERFICIALIZATION Left 12/02/2021   Procedure: PLICATION OF LEFT RADIUS CEPHALIC FISTULA;  Surgeon: Leonie Douglas, MD;  Location: [facility] OR;  Service: Vascular;  Laterality: Left;  PERIPHERAL NERVE BLOCK   FRACTURE SURGERY     I & D EXTREMITY Left 07/16/2014   Procedure: IRRIGATION AND DEBRIDEMENT EXTREMITY/LEFT INDEX FINGER;  Surgeon: Betha Loa, MD;  Location: [facility] OR;  Service: Orthopedics;  Laterality: Left;   IR FLUORO GUIDE CV LINE RIGHT  10/04/2022   IR FLUORO GUIDE CV LINE RIGHT  10/05/2022   IR PERC TUN PERIT CATH WO PORT S&I /IMAG  08/07/2020   IR THROMBECTOMY AV FISTULA W/THROMBOLYSIS/PTA INC/SHUNT/IMG LEFT Left 10/05/2022   IR US GUIDE VASC ACCESS RIGHT  08/07/2020   IR US GUIDE VASC ACCESS RIGHT  10/04/2022   IR US GUIDE VASC ACCESS RIGHT  10/05/2022   Family History  Problem Relation Age of Onset   Cancer Mother    Stroke Father    Diabetes Father    Hypertension Father    Social History:  reports that he has never smoked. He has never been exposed to tobacco smoke. He has never used smokeless tobacco. He reports that he does not currently use alcohol. He reports that he does not use drugs.  ROS: As per HPI otherwise negative.  Physical Exam: Vitals:   10/26/22 1115 10/26/22 1130 10/26/22 1250 10/26/22 1300  BP: Marland Kitchen)  167/100 (!) 170/103 (!) 168/103 (!) 181/114  Pulse: 80 81 84 88  Resp: 16 18 15 16   Temp:      TempSrc:      SpO2: 99% 96% 99% 93%     General: Well developed, well nourished, in no acute distress, sleepy today. Head: Normocephalic, atraumatic, sclera non-icteric, mucus membranes are moist. Neck: Supple without lymphadenopathy/masses. JVD not elevated. Lungs: Clear bilaterally to auscultation without wheezes, rales, or rhonchi.  Heart: RRR with normal S1, S2. No murmurs, rubs, or gallops appreciated. Abdomen: Soft, non-tender, non-distended with  normoactive bowel sounds. No rebound/guarding. No obvious abdominal masses. Musculoskeletal:  Strength and tone appear normal for age. Lower extremities: No edema or ischemic changes, no open wounds. Neuro: Alert and oriented X 3. Moves all extremities spontaneously. Psych:  Responds to questions appropriately with a normal affect. 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, small scab but no erythema or drainage noted  Allergies  Allergen Reactions   Amlodipine Nausea And Vomiting and Other (See Comments)    Patient was taking Amlodipine and Hydralazine at the same time, so the reactions came from one of the 2: Lethargy and an all-over feeling of NOT feeling well (also)   Hydralazine Nausea And Vomiting and Other (See Comments)    Patient was taking Hydralazine AND Amlodipine at the same time, so the reactions came from one of the 2: Lethargy and an all-over feeling of NOT feeling well (also)   Prior to Admission medications   Medication Sig Start Date End Date Taking? Authorizing Provider  acetaminophen (TYLENOL) 325 MG tablet Take 2 tablets (650 mg total) by mouth every 6 (six) hours as needed for mild pain (or Fever >/= 101). 12/30/21   Rhetta Mura, MD  atorvastatin (LIPITOR) 40 MG tablet Take 40 mg by mouth daily.    [provider]  B Complex-C-Folic Acid (RENA-VITE RX) 1 MG TABS Take 1 tablet by mouth daily. 06/28/22   [provider]  calcitRIOL (ROCALTROL) 0.5 MCG capsule Take 2 capsules (1 mcg total) by mouth Every Tuesday,Thursday,and Saturday with dialysis. 12/31/21   Rhetta Mura, MD  carvedilol (COREG) 25 MG tablet Take 1 tablet (25 mg total) by mouth 2 (two) times daily with a meal. 03/05/21   Russella Dar, NP  cloNIDine (CATAPRES) 0.1 MG tablet Take 0.1 mg by mouth 2 (two) times daily. 04/20/22   [provider]  insulin glargine-yfgn (SEMGLEE) 100 UNIT/ML injection Inject 0.12 mLs (12 Units total) into the skin at  bedtime. 07/09/22   Rema Fendt, NP  insulin lispro (HUMALOG) 100 UNIT/ML injection Inject 1-7 Units into the skin See admin instructions. Per sliding scale 3 times daily with meals 151-200= 1 unit 201-250= 2 units 251-300= 3 units 301-350= 5 units 351-400= 7 units Greater than 400 call md takes only as needed if blood sugar is over 200    [provider]  Insulin Pen Needle (PEN NEEDLES) 31G X 8 MM MISC UAD 07/09/22   Rema Fendt, NP  Multiple Vitamins-Minerals (MULTIVITAMIN WITH MINERALS) tablet Take 1 tablet by mouth daily.    [provider]  multivitamin (RENA-VIT) TABS tablet Take 1 tablet by mouth at bedtime. Patient not taking: Reported on 10/30/2021 11/27/20   [name], MD  ondansetron (ZOFRAN) 4 MG tablet Take 4 mg by mouth every 8 (eight) hours as needed for nausea or vomiting. 04/12/21   [provider]  prednisoLONE acetate (PRED FORTE) 1 % ophthalmic  suspension Place 1 drop into the left eye See admin instructions. 2-3 times daily 10/15/21   [provider]  sevelamer carbonate (RENVELA) 800 MG tablet Take 2 tablets (1,600 mg total) by mouth 3 (three) times daily with meals. Patient not taking: Reported on 12/29/2021 03/05/21   Russella Dar, NP   Current Facility-Administered Medications  Medication Dose Route Frequency Provider Last Rate Last Admin   acetaminophen (TYLENOL) tablet 650 mg  650 mg Oral Q6H PRN Synetta Fail, MD       Or   acetaminophen (TYLENOL) suppository 650 mg  650 mg Rectal Q6H PRN Synetta Fail, MD       carvedilol (COREG) tablet 25 mg  25 mg Oral BID WC Synetta Fail, MD       ceFAZolin (ANCEF) IVPB 1 g/50 mL premix  1 g Intravenous Q24H Synetta Fail, MD       [START ON 10/27/2022] Chlorhexidine Gluconate Cloth 2 % PADS 6 each  6 each Topical Q0600 Julien Nordmann, PA-C       cloNIDine (CATAPRES) tablet 0.1 mg  0.1 mg Oral BID Synetta Fail, MD       heparin injection 5,000  Units  5,000 Units Subcutaneous Q8H Synetta Fail, MD   5,000 Units at 10/26/22 1323   polyethylene glycol (MIRALAX / GLYCOLAX) packet 17 g  17 g Oral Daily PRN Synetta Fail, MD       sodium chloride flush (NS) 0.9 % injection 3 mL  3 mL Intravenous Q12H Synetta Fail, MD   3 mL at 10/26/22 1323   Current Outpatient Medications  Medication Sig Dispense Refill   acetaminophen (TYLENOL) 325 MG tablet Take 2 tablets (650 mg total) by mouth every 6 (six) hours as needed for mild pain (or Fever >/= 101).     atorvastatin (LIPITOR) 40 MG tablet Take 40 mg by mouth daily.     B Complex-C-Folic Acid (RENA-VITE RX) 1 MG TABS Take 1 tablet by mouth daily.     calcitRIOL (ROCALTROL) 0.5 MCG capsule Take 2 capsules (1 mcg total) by mouth Every Tuesday,Thursday,and Saturday with dialysis. 30 capsule 1   carvedilol (COREG) 25 MG tablet Take 1 tablet (25 mg total) by mouth 2 (two) times daily with a meal.     cloNIDine (CATAPRES) 0.1 MG tablet Take 0.1 mg by mouth 2 (two) times daily.     insulin glargine-yfgn (SEMGLEE) 100 UNIT/ML injection Inject 0.12 mLs (12 Units total) into the skin at bedtime. 10 mL 0   insulin lispro (HUMALOG) 100 UNIT/ML injection Inject 1-7 Units into the skin See admin instructions. Per sliding scale 3 times daily with meals 151-200= 1 unit 201-250= 2 units 251-300= 3 units 301-350= 5 units 351-400= 7 units Greater than 400 call md takes only as needed if blood sugar is over 200     Insulin Pen Needle (PEN NEEDLES) 31G X 8 MM MISC UAD 100 each 0   Multiple Vitamins-Minerals (MULTIVITAMIN WITH MINERALS) tablet Take 1 tablet by mouth daily.     multivitamin (RENA-VIT) TABS tablet Take 1 tablet by mouth at bedtime. (Patient not taking: Reported on 10/30/2021) 30 tablet 0   ondansetron (ZOFRAN) 4 MG tablet Take 4 mg by mouth every 8 (eight) hours as needed for nausea or vomiting.     prednisoLONE acetate (PRED FORTE) 1 % ophthalmic suspension Place 1 drop into the  left eye See admin instructions. 2-3 times daily  sevelamer carbonate (RENVELA) 800 MG tablet Take 2 tablets (1,600 mg total) by mouth 3 (three) times daily with meals. (Patient not taking: Reported on 12/29/2021)     Labs: Basic Metabolic Panel: Recent Labs  Lab 10/24/22 1428 10/26/22 1120  NA 132* 131*  K 3.6 3.7  CL 92* 92*  CO2 23 22  GLUCOSE 122* 106*  BUN 31* 57*  CREATININE 10.51* 16.64*  CALCIUM 8.0* 6.9*   Liver Function Tests: Recent Labs  Lab 10/24/22 1428 10/26/22 1120  AST 15 13*  ALT 11 19  ALKPHOS 55 46  BILITOT 0.9 0.6  PROT 7.8 7.3  ALBUMIN 3.6 3.5   CBC: Recent Labs  Lab 10/24/22 1428 10/26/22 1120  WBC 7.6 5.1  NEUTROABS 6.9 3.1  HGB 10.5* 9.1*  HCT 34.0* 28.3*  MCV 81.5 78.8*  PLT 230 158   Studies/Results: DG Chest 2 View  Result Date: 10/26/2022 CLINICAL DATA:  Shortness of breath EXAM: CHEST - 2 VIEW COMPARISON:  Chest x-ray 10/24/2022 and older FINDINGS: Stable right IJ large-bore catheter with tip at the SVC right atrial junction region. Enlarged cardiopericardial silhouette with some vascular congestion and trace edema, increased from previous. No pneumothorax, effusion or consolidation. Overlapping cardiac leads. IMPRESSION: Developing vascular congestion and trace edema.  Right IJ line. Electronically Signed   By: Karen Kays M.D.   On: 10/26/2022 12:09   US Abdomen Limited RUQ (LIVER/GB)  Result Date: 10/24/2022 CLINICAL DATA:  Epigastric pain. EXAM: ULTRASOUND ABDOMEN LIMITED RIGHT UPPER QUADRANT COMPARISON:  CT abdomen pelvis dated March 11, 2021. FINDINGS: Gallbladder: No gallstones or wall thickening visualized. No sonographic Murphy sign noted by sonographer. Common bile duct: Diameter: 4 mm, normal. Liver: No focal lesion identified. Increased parenchymal echogenicity. Portal vein is patent on color Doppler imaging with normal direction of blood flow towards the liver. Other: None. IMPRESSION: 1. No acute abnormality. 2. Hepatic  steatosis. Electronically Signed   By: Obie Dredge M.D.   On: 10/24/2022 17:28   DG Chest 2 View  Result Date: 10/24/2022 CLINICAL DATA:  Fever, vomiting and flu-like symptoms since Friday EXAM: CHEST - 2 VIEW COMPARISON:  Chest x-ray dated 10/04/2022. FINDINGS: Stable cardiomegaly. Lungs are clear. No pleural effusion or pneumothorax is seen. Osseous structures about the chest are unremarkable. IMPRESSION: No active cardiopulmonary disease. No evidence of pneumonia or pulmonary edema. Stable cardiomegaly. Electronically Signed   By: Bary Richard M.D.   On: 10/24/2022 15:14    Dialysis Orders:  TTS - [facility] 4hr, 450/500, EDW 89kg, 1K/2.25Ca bath, AVF + TDC (awaiting removal), heparin 500 unit bolus + 1000/hr heparin infusion - Mircera IV q 2 weeks (just given, not yet due) - Venofer  IV weekly - Calcitriol PO q HD  Assessment/Plan:  Staph aureus bacteremia: Blood Cx 4/12 +, repeat pending. Susceptibilities pending, felt likely will be MSSA. ID consulting, starting Cefazolin with HD. Echo ordered.  ESRD:  Continue HD per usual TTS schedule - for HD today, TDC should be removed given bacteremia. AVF working ok for now, felt that will likely fail soon per notes. Will need outpateint evaluation for new access after abx course finished. Uses 1K bath as outpatient - K low sided here, will use 3K today.  Hypertension/volume: BP high, mild pulm edema in CXR, UF as tolerated.  Anemia: Hgb 9.1 - not due to ESA yet.  Metabolic bone disease: Ca low, follow after HD. Continue VDRA.  T2DM  Ozzie Hoyle, Cordelia Poche 10/26/2022, 2:01 PM  [facility]

## 2022-10-26 NOTE — Progress Notes (Signed)
Patient tolerated 3.5hrs of dialysis, 3.0L removed. B/P elevated before during and after tx. 198/100. Pt is without symptoms. Needles removed, minimal blood loss. Site dressed with pressure gauze.

## 2022-10-27 ENCOUNTER — Inpatient Hospital Stay (HOSPITAL_COMMUNITY): Payer: Medicaid Other

## 2022-10-27 ENCOUNTER — Encounter (HOSPITAL_COMMUNITY): Payer: Self-pay | Admitting: Internal Medicine

## 2022-10-27 DIAGNOSIS — I361 Nonrheumatic tricuspid (valve) insufficiency: Secondary | ICD-10-CM

## 2022-10-27 DIAGNOSIS — R7881 Bacteremia: Secondary | ICD-10-CM

## 2022-10-27 DIAGNOSIS — I5030 Unspecified diastolic (congestive) heart failure: Secondary | ICD-10-CM

## 2022-10-27 DIAGNOSIS — B9561 Methicillin susceptible Staphylococcus aureus infection as the cause of diseases classified elsewhere: Secondary | ICD-10-CM | POA: Diagnosis not present

## 2022-10-27 DIAGNOSIS — I517 Cardiomegaly: Secondary | ICD-10-CM

## 2022-10-27 HISTORY — PX: IR REMOVAL TUN CV CATH W/O FL: IMG2289

## 2022-10-27 LAB — GLUCOSE, CAPILLARY
Glucose-Capillary: 162 mg/dL — ABNORMAL HIGH (ref 70–99)
Glucose-Capillary: 123 mg/dL — ABNORMAL HIGH (ref 70–99)
Glucose-Capillary: 160 mg/dL — ABNORMAL HIGH (ref 70–99)

## 2022-10-27 LAB — CBC
HCT: 29.4 % — ABNORMAL LOW (ref 39.0–52.0)
Hemoglobin: 9.4 g/dL — ABNORMAL LOW (ref 13.0–17.0)
MCH: 25.4 pg — ABNORMAL LOW (ref 26.0–34.0)
MCHC: 32 g/dL (ref 30.0–36.0)
MCV: 79.5 fL — ABNORMAL LOW (ref 80.0–100.0)
Platelets: 162 10*3/uL (ref 150–400)
RBC: 3.7 MIL/uL — ABNORMAL LOW (ref 4.22–5.81)
RDW: 19.9 % — ABNORMAL HIGH (ref 11.5–15.5)
WBC: 4.8 10*3/uL (ref 4.0–10.5)
nRBC: 0 % (ref 0.0–0.2)

## 2022-10-27 LAB — RENAL FUNCTION PANEL
Albumin: 3.2 g/dL — ABNORMAL LOW (ref 3.5–5.0)
Anion gap: 14 (ref 5–15)
BUN: 33 mg/dL — ABNORMAL HIGH (ref 6–20)
CO2: 28 mmol/L (ref 22–32)
Calcium: 7.7 mg/dL — ABNORMAL LOW (ref 8.9–10.3)
Chloride: 92 mmol/L — ABNORMAL LOW (ref 98–111)
Creatinine, Ser: 11.32 mg/dL — ABNORMAL HIGH (ref 0.61–1.24)
GFR, Estimated: 5 mL/min — ABNORMAL LOW (ref >60)
Glucose, Bld: 120 mg/dL — ABNORMAL HIGH (ref 70–99)
Phosphorus: 6.1 mg/dL — ABNORMAL HIGH (ref 2.5–4.6)
Potassium: 3.8 mmol/L (ref 3.5–5.1)
Sodium: 134 mmol/L — ABNORMAL LOW (ref 135–145)

## 2022-10-27 LAB — CULTURE, BLOOD (ROUTINE X 2)
Special Requests: ADEQUATE
Special Requests: ADEQUATE

## 2022-10-27 LAB — ECHOCARDIOGRAM COMPLETE
AV Mean grad: 5 mmHg
AV Peak grad: 8.8 mmHg
Ao pk vel: 1.48 m/s
Area-P 1/2: 5.06 cm2
Calc EF: 60 %
S' Lateral: 4 cm
Single Plane A2C EF: 48.6 %
Single Plane A4C EF: 67.4 %

## 2022-10-27 LAB — HEPATITIS B SURFACE ANTIGEN: Hepatitis B Surface Ag: NONREACTIVE

## 2022-10-27 MED ORDER — INSULIN ASPART 100 UNIT/ML IJ SOLN: 0.0000 [IU] | Freq: Every day | INTRAMUSCULAR | Status: DC

## 2022-10-27 MED ORDER — LIDOCAINE HCL 1 % IJ SOLN
INTRAMUSCULAR | Status: AC
  Filled 2022-10-27: qty 20

## 2022-10-27 MED ORDER — CHLORHEXIDINE GLUCONATE 4 % EX SOLN
CUTANEOUS | Status: AC
  Filled 2022-10-27: qty 15

## 2022-10-27 MED ORDER — INSULIN ASPART 100 UNIT/ML IJ SOLN
0.0000 [IU] | Freq: Three times a day (TID) | INTRAMUSCULAR | Status: DC
  Administered 2022-10-27 – 2022-10-30 (×2): 1 [IU] via SUBCUTANEOUS

## 2022-10-27 NOTE — Plan of Care (Signed)
  Problem: Education: Goal: Knowledge of General Education information will improve Description: Including pain rating scale, medication(s)/side effects and non-pharmacologic comfort measures Outcome: Progressing   Problem: Health Behavior/Discharge Planning: Goal: Ability to manage health-related needs will improve Outcome: Progressing   Problem: Nutrition: Goal: Adequate nutrition will be maintained Outcome: Progressing   Problem: Activity: Goal: Risk for activity intolerance will decrease Outcome: Progressing   Problem: Coping: Goal: Level of anxiety will decrease Outcome: Progressing   Problem: Elimination: Goal: Will not experience complications related to bowel motility Outcome: Progressing Goal: Will not experience complications related to urinary retention Outcome: Progressing   Problem: Pain Managment: Goal: General experience of comfort will improve Outcome: Progressing   Problem: Safety: Goal: Ability to remain free from injury will improve Outcome: Progressing   

## 2022-10-27 NOTE — H&P (View-Only) (Signed)
[facility] Progress Note Patient: [name] MRN:[id number] DOB: 11/10/1985 DOA: 10/26/2022  DOS: the patient was seen and examined on 10/27/2022  Brief hospital course:  [name] is a 37 y.o. male with medical history significant of diastolic CHF, ESRD on HD, anemia, hypertension, diabetes presenting with positive blood cultures   Patient was seen in the ED on 4/14 presenting with fever, nausea, vomiting, diarrhea for couple days.  Also feeling generally unwell at that time.  Symptoms have been ongoing since around 4/12.  He felt better in the ED and workup was overall reassuring and patient was discharged.  Blood cultures obtained to the visit have come back positive today for MSSA in both bottles.  Patient advised to come back to the ED for admission for IV antibiotics and further workup. TDC removed on 4/17.  Cardiology consulted for TEE.  ID following. Assessment and Plan: MSSA bacteremia. Patient denies any acute complaint right now. Feeling better. Repeat cultures ordered. Infectious disease following. Currently on IV cefazolin. Echocardiogram 55 to 60% EF.  5 by 3 mm oscillating mass on the tricuspid leaflet concerning for vegetation. TEE ordered. TDC was removed on 4/17. Will follow recommendation.  ESRD on HD. TTS schedule. aVF working. Will require outpatient evaluation for a new access.  HTN. Blood pressure stable.  On Coreg 25 mg twice daily, clonidine  0.1 Mg twice daily Monitor.  Mild pulmonary edema. Resolved with HD. Monitor.  Anemia of CKD. Hemoglobin stable. Monitor.  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 Currently on sliding scale insulin. Holding long-acting insulin. Monitor.   Subjective: No nausea no vomiting no fever no chills.  No chest pain.  No abdominal pain.  Physical Exam: General: in Mild distress, No Rash Cardiovascular: S1 and S2 Present, No Murmur Respiratory: Good respiratory effort,  Bilateral Air entry present. No Crackles, No wheezes Abdomen: Bowel Sound present, No tenderness Extremities: No edema Neuro: Alert and oriented x3, no new focal deficit  Data Reviewed: I have Reviewed nursing notes, Vitals, and Lab results. Since last encounter, pertinent lab results CBC and BMP   . I have ordered test including CBC and BMP  .  Discussed with nephrology Disposition: Status is: Inpatient Remains inpatient appropriate because: Need for IV biotics and further workup  heparin injection 5,000 Units Start: 10/26/22 1400   Family Communication: No one at bedside Level of care: Med-Surg   Vitals:   10/27/22 0500 10/27/22 0917 10/27/22 1233 10/27/22 1722  BP: 136/78 (!) 153/99 (!) 131/105 124/82  Pulse: 79 78 73 87  Resp: 20 18 18 18  Temp: 99.7 F (37.6 C) (!) 100.7 F (38.2 C) 98.6 F (37 C) 98.6 F (37 C)  TempSrc: Oral Oral Oral Oral  SpO2: 95% 100% 97% 98%     Author: Naiara Lombardozzi, MD 10/27/2022 5:54 PM  Please look on [email or web address] to find out who is on call. 

## 2022-10-27 NOTE — Progress Notes (Signed)
Successful removal of tunneled (R)IJ Permcath No complication. Small amount purulence expressed from tract upon removal.  Brayton El PA-C Interventional Radiology 10/27/2022 8:45 AM

## 2022-10-27 NOTE — Progress Notes (Signed)
[facility] Progress Note Patient: [name]:[id number] DOB: March 22, 1986 DOA: 10/26/2022  DOS: the patient was seen and examined on 10/27/2022  Brief hospital course:  [name] is a 37 y.o. male with medical history significant of diastolic CHF, ESRD on HD, anemia, hypertension, diabetes presenting with positive blood cultures   Patient was seen in the ED on 4/14 presenting with fever, nausea, vomiting, diarrhea for couple days.  Also feeling generally unwell at that time.  Symptoms have been ongoing since around 4/12.  He felt better in the ED and workup was overall reassuring and patient was discharged.  Blood cultures obtained to the visit have come back positive today for MSSA in both bottles.  Patient advised to come back to the ED for admission for IV antibiotics and further workup. TDC removed on 4/17.  Cardiology consulted for TEE.  ID following. Assessment and Plan: MSSA bacteremia. Patient denies any acute complaint right now. Feeling better. Repeat cultures ordered. Infectious disease following. Currently on IV cefazolin. Echocardiogram 55 to 60% EF.  5 by 3 mm oscillating mass on the tricuspid leaflet concerning for vegetation. TEE ordered. TDC was removed on 4/17. Will follow recommendation.  ESRD on HD. TTS schedule. aVF working. Will require outpatient evaluation for a new access.  HTN. Blood pressure stable.  On Coreg 25 mg twice daily, clonidine  0.1 Mg twice daily Monitor.  Mild pulmonary edema. Resolved with HD. Monitor.  Anemia of CKD. Hemoglobin stable. Monitor.  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 Currently on sliding scale insulin. Holding long-acting insulin. Monitor.   Subjective: No nausea no vomiting no fever no chills.  No chest pain.  No abdominal pain.  Physical Exam: General: in Mild distress, No Rash Cardiovascular: S1 and S2 Present, No Murmur Respiratory: Good respiratory effort,  Bilateral Air entry present. No Crackles, No wheezes Abdomen: Bowel Sound present, No tenderness Extremities: No edema Neuro: Alert and oriented x3, no new focal deficit  Data Reviewed: I have Reviewed nursing notes, Vitals, and Lab results. Since last encounter, pertinent lab results CBC and BMP   . I have ordered test including CBC and BMP  .  Discussed with nephrology Disposition: Status is: Inpatient Remains inpatient appropriate because: Need for IV biotics and further workup  heparin injection 5,000 Units Start: 10/26/22 1400   Family Communication: No one at bedside Level of care: Med-Surg   Vitals:   10/27/22 0500 10/27/22 0917 10/27/22 1233 10/27/22 1722  BP: 136/78 (!) 153/99 (!) 131/105 124/82  Pulse: 79 78 73 87  Resp: Temp: 99.7 F (37.6 C) (!) 100.7 F (38.2 C) 98.6 F (37 C) 98.6 F (37 C)  TempSrc: Oral Oral Oral Oral  SpO2: 95% 100% 97% 98%     Author: Lynden Oxford, MD 10/27/2022 5:54 PM  Please look on [email or web address] to find out who is on call.

## 2022-10-27 NOTE — Progress Notes (Signed)
Pt receives out-pt HD at [facility] on TTS. Will assist as needed.   Tenecia Ignasiak Renal Navigator [phone] 

## 2022-10-27 NOTE — Progress Notes (Addendum)
[facility] Infectious Diseases Follow Up Note  Patient Identification: Patient Name: [name] MRN: [id number] Admit Date: 10/26/2022 10:17 AM Age: 37 y.o.Today's Date: 10/27/2022  Reason for Visit: MSSA bacteremia   Principal Problem:   MSSA bacteremia Active Problems:   Anemia   Essential hypertension   Type 2 diabetes mellitus with ESRD (end-stage renal disease)   ESRD (end stage renal disease) on dialysis   Hemodialysis catheter infection   Antibiotics: Cefazolin 4/16-c  Lines/Hardwares: Left arm AVF  Interval Events: T max 100.7, s/p removal of TDC today ( Small amount purulence expressed from tract upon removal.  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 This is favored to represent redundant  valve tissue, rather than a vegetation. Cannot r/o vegetation   # ESRD on HD  - Nephrology following  Recommendations Continue cefazolin as is Fu blood cx for clearance and sensi Repeat 2 sets of blood cx ordered after [facility] removal today  TEE tomorrow  Monitor CBC, BMP and metastatic sites of infection  Engage Vascular for assessing need for thrombectomy as was planned during last admission  Rest of the management as per the primary team. Thank you for the consult. Please page with pertinent  questions or concerns.  ______________________________________________________________________ Subjective patient seen and examined at the bedside. No complaints  Vitals BP (!) 131/105 (BP Location: Right Arm)   Pulse 73   Temp 98.6 F (37 C) (Oral)   Resp 18   SpO2 97%     Physical Exam Constitutional:  adult male lying in the  bed and tired     Comments:   Cardiovascular:     Rate and Rhythm: Normal rate and regular rhythm.     Heart sounds: HD Cath removed , covered with bandage  Pulmonary:     Effort: Pulmonary effort is normal on room air     Comments:   Abdominal:     Palpations: Abdomen is soft.     Tenderness: non distended and non tender   Musculoskeletal:        General: No swelling or tenderness in peripheral joints, No vertebral tenderness  Skin:    Comments: No rashes, Left UE AVF with thrill +, chronic appearing scattered generalized lesions which he reports started after HD  Neurological:     General: awake, alert and oriented, grossly non focal   Psychiatric:        Mood and Affect: Mood normal.   Pertinent Microbiology Results for orders placed or performed during the hospital encounter of 10/26/22  Blood culture (routine x 2)     Status: None (Preliminary result)   Collection Time: 10/26/22 11:20 AM   Specimen: BLOOD RIGHT ARM  Result Value Ref Range Status   Specimen Description BLOOD RIGHT ARM  Final  Special Requests   Final    BOTTLES DRAWN AEROBIC AND ANAEROBIC Blood Culture adequate volume   Culture   Final    NO GROWTH < 24 HOURS Performed at [facility], [address], [place], [place] [address]    Report Status PENDING  Incomplete  Blood culture (routine x 2)     Status: None (Preliminary result)   Collection Time: 10/26/22 11:27 AM   Specimen: BLOOD  Result Value Ref Range Status   Specimen Description BLOOD FOREARM  Final   Special Requests   Final    BOTTLES DRAWN AEROBIC AND ANAEROBIC Blood Culture results may not  be optimal due to an excessive volume of blood received in culture bottles   Culture  Setup Time   Final    GRAM POSITIVE COCCI AEROBIC BOTTLE ONLY CRITICAL VALUE NOTED.  VALUE IS CONSISTENT WITH PREVIOUSLY REPORTED AND CALLED VALUE. Performed at [facility], [address], [place], [place] [address]    Culture GRAM POSITIVE COCCI  Final   Report Status PENDING  Incomplete    Pertinent Lab.    Latest Ref Rng & Units 10/27/2022    4:23 AM 10/26/2022   11:20 AM 10/24/2022    2:28 PM  CBC  WBC 4.0 - 10.5 K/uL 4.8  5.1  7.6   Hemoglobin 13.0 - 17.0 g/dL 9.4  9.1  42.5   Hematocrit 39.0 - 52.0 % 29.4  28.3  34.0   Platelets 150 - 400 K/uL 162  158  230       Latest Ref Rng & Units 10/27/2022    4:23 AM 10/26/2022   11:20 AM 10/24/2022    2:28 PM  CMP  Glucose 70 - 99 mg/dL 956  387  564   BUN 6 - 20 mg/dL 33  57  31   Creatinine 0.61 - 1.24 mg/dL 33.29  51.88  41.66   Sodium 135 - 145 mmol/L 134  131  132   Potassium 3.5 - 5.1 mmol/L 3.8  3.7  3.6   Chloride 98 - 111 mmol/L 92  92  92   CO2 22 - 32 mmol/L 28  22  23    Calcium 8.9 - 10.3 mg/dL 7.7  6.9  8.0   Total Protein 6.5 - 8.1 g/dL  7.3  7.8   Total Bilirubin 0.3 - 1.2 mg/dL  0.6  0.9   Alkaline Phos 38 - 126 U/L  46  55   AST 15 - 41 U/L  13  15   ALT 0 - 44 U/L  19  11      Pertinent Imaging today Plain films and CT images have been personally visualized and interpreted; radiology reports have been reviewed. Decision making incorporated into the Impression / Recommendations.  ECHOCARDIOGRAM COMPLETE  Result Date: 10/27/2022    ECHOCARDIOGRAM REPORT   Patient Name:   [name] Date of Exam: 10/27/2022 Medical Rec #:  [id number]       Height:       72.0 in Accession #:    [id number]      Weight:       201.6 lb Date of Birth:  [birth year]       BSA:          2.138 m Patient Age:    36 years        BP:           136/78 mmHg Patient Gender: M  HR:           79 bpm. Exam Location:  Inpatient Procedure:  2D Echo, 3D Echo, Strain Analysis, Color Doppler and Cardiac Doppler Indications:    Bacteremia  History:        Patient has prior history of Echocardiogram examinations, most                 recent 02/09/2021. Hypertrophic Cardiomyopathy and CHF,                 Signs/Symptoms:Shortness of Breath and Syncope; Risk                 Factors:Hypertension and Diabetes.  Sonographer:    Milbert Coulter Referring Phys: [id number] [facility]  Sonographer Comments: Global longitudinal strain was attempted. IMPRESSIONS  1. Left ventricular ejection fraction, by estimation, is 55 to 60%. The left ventricle has normal function. The left ventricle has no regional wall motion abnormalities. There is moderate concentric left ventricular hypertrophy. Left ventricular diastolic parameters are consistent with Grade III diastolic dysfunction (restrictive). Elevated left atrial pressure. The average left ventricular global longitudinal strain is -11.2 %. The global longitudinal strain is abnormal.  2. Right ventricular systolic function is normal. The right ventricular size is mildly enlarged. There is severely elevated pulmonary artery systolic pressure. The estimated right ventricular systolic pressure is 65.1 mmHg.  3. The mitral valve is normal in structure. Trivial mitral valve regurgitation. No evidence of mitral stenosis.  4. There is a small (5x3 mm) oscillating mass on the ventricular surface of the anterior tricuspid leaflet. It most likely represents redundant chordal or valve tissue, but cannot completely exclude a vegetation. Tricuspid valve regurgitation is mild to  moderate.  5. The aortic valve is normal in structure. Aortic valve regurgitation is not visualized. No aortic stenosis is present.  6. The inferior vena cava is dilated in size with <50% respiratory variability, suggesting right atrial pressure of 15 mmHg. Comparison(s): No significant change from prior study. Prior images reviewed side by side.  Conclusion(s)/Recommendation(s): There is a small oscillating echodensity on the anterior tricuspid leaflet. This is favored to represent redundant valve tissue, rather than a vegetation. It was similar on the 2022 study. There are no other findings that  suggest endocarditis. FINDINGS  Left Ventricle: Left ventricular ejection fraction, by estimation, is 55 to 60%. The left ventricle has normal function. The left ventricle has no regional wall motion abnormalities. The average left ventricular global longitudinal strain is -11.2 %. The global longitudinal strain is abnormal. The left ventricular internal cavity size was normal in size. There is moderate concentric left ventricular hypertrophy. Left ventricular diastolic parameters are consistent with Grade III diastolic dysfunction  (restrictive). Elevated left atrial pressure. Right Ventricle: The right ventricular size is mildly enlarged. No increase in right ventricular wall thickness. Right ventricular systolic function is normal. There is severely elevated pulmonary artery systolic pressure. The tricuspid regurgitant velocity is 3.54 m/s, and with an assumed right atrial pressure of 15 mmHg, the estimated right ventricular systolic pressure is 65.1 mmHg. Left Atrium: Left atrial size was normal in size. Right Atrium: Right atrial size was normal in size. Pericardium: There is no evidence of pericardial effusion. Mitral Valve: The mitral valve is normal in structure. Trivial mitral valve regurgitation. No evidence of mitral valve stenosis. Tricuspid Valve: There is a small (5x3 mm) oscillating mass on the ventricular surface of the anterior tricuspid leaflet. It most likely represents redundant chordal or valve tissue, but cannot completely  exclude a vegetation. The tricuspid valve is normal in structure. Tricuspid valve regurgitation is mild to moderate. No evidence of tricuspid stenosis. Aortic Valve: The aortic valve is normal in structure. Aortic valve  regurgitation is not visualized. No aortic stenosis is present. Aortic valve mean gradient measures 5.0 mmHg. Aortic valve peak gradient measures 8.8 mmHg. Pulmonic Valve: The pulmonic valve was normal in structure. Pulmonic valve regurgitation is trivial. No evidence of pulmonic stenosis. Aorta: The aortic root is normal in size and structure. Venous: The inferior vena cava is dilated in size with less than 50% respiratory variability, suggesting right atrial pressure of 15 mmHg. IAS/Shunts: No atrial level shunt detected by color flow Doppler.  LEFT VENTRICLE PLAX 2D LVIDd:         5.60 cm      Diastology LVIDs:         4.00 cm      LV e' medial:    6.31 cm/s LV PW:         1.60 cm      LV E/e' medial:  23.8 LV IVS:        1.50 cm      LV e' lateral:   7.18 cm/s                             LV E/e' lateral: 20.9  LV Volumes (MOD)            2D Longitudinal Strain LV vol d, MOD A2C: 137.0 ml 2D Strain GLS Avg:     -11.2 % LV vol d, MOD A4C: 144.0 ml LV vol s, MOD A2C: 70.4 ml LV vol s, MOD A4C: 47.0 ml LV SV MOD A2C:     66.6 ml LV SV MOD A4C:     144.0 ml LV SV MOD BP:      85.4 ml RIGHT VENTRICLE RV Basal diam:  4.70 cm RV Mid diam:    4.20 cm RV S prime:     13.70 cm/s TAPSE (M-mode): 1.7 cm LEFT ATRIUM             Index        RIGHT ATRIUM           Index LA Vol (A2C):   63.0 ml 29.47 ml/m  RA Area:     19.70 cm LA Vol (A4C):   82.7 ml 38.68 ml/m  RA Volume:   54.50 ml  25.49 ml/m LA Biplane Vol: 75.7 ml 35.41 ml/m  AORTIC VALVE AV Vmax:           148.00 cm/s AV Vmean:          103.000 cm/s AV VTI:            0.280 m AV Peak Grad:      8.8 mmHg AV Mean Grad:      5.0 mmHg LVOT Vmax:         119.00 cm/s LVOT Vmean:        75.500 cm/s LVOT VTI:          0.217 m LVOT/AV VTI ratio: 0.77 MITRAL VALVE                TRICUSPID VALVE MV Area (PHT): 5.06 cm     TR Peak grad:   50.1 mmHg MV Decel Time: 150 msec     TR Vmax:        354.00 cm/s MV E velocity: 150.00  cm/s MV A velocity: 68.10 cm/s   SHUNTS MV E/A  ratio:  2.20         Systemic VTI: 0.22 m Rachelle Hora Croitoru MD Electronically signed by Thurmon Fair MD Signature Date/Time: 10/27/2022/11:32:30 AM    Final    IR Removal Tun Cv Cath W/O FL  Result Date: 10/27/2022 INDICATION: Bacteremia. Request removal of tunneled hemodialysis catheter placed on 10/06/2022. Patient also has functional left upper extremity AV fistula. EXAM: REMOVAL OF TUNNELED RIGHT IJ HEMODIALYSIS CATHETER MEDICATIONS: None COMPLICATIONS: None immediate. PROCEDURE: Informed written consent was obtained from the patient following an explanation of the procedure, risks, benefits and alternatives to treatment. A time out was performed prior to the initiation of the procedure. Maximal barrier sterile technique was utilized including caps, mask, sterile gowns, sterile gloves, large sterile drape, hand hygiene, and chlorhexidine. Utilizing a combination of blunt dissection and gentle traction, the cuff of the catheter was exposed and the catheter was removed intact. Hemostasis was obtained with manual compression. A dressing was placed. The patient tolerated the procedure well without immediate post procedural complication. IMPRESSION: Successful removal of tunneled right IJ hemodialysis catheter. Read by: Brayton El PA-C Electronically Signed   By: Simonne Come M.D.   On: 10/27/2022 09:54     I spent at least  50 minutes for this patient encounter including review of prior medical records, coordination of care with primary/other specialist with greater than 50% of time being face to face/counseling and discussing diagnostics/treatment plan with the patient/family.  Electronically signed by:   Odette Fraction, MD Infectious Disease Physician [facility] for Infectious Disease Pager: [phone]

## 2022-10-27 NOTE — Progress Notes (Signed)
[facility] Progress Note    Assessment/ Plan:    Staph aureus bacteremia: Blood Cx 4/12 +, repeat 4/14 here positive. Susceptibilities pending, felt likely will be MSSA. ID consulting, on Cefazolin with HD. Echo read pending. TDC removed 4/17  ESRD:  Continue HD per usual TTS schedule - AVF working ok for now, felt that will likely fail soon per notes. Will need outpateint evaluation for new access after abx course finished.  Hypertension/volume: BP better, mild pulm edema in CXR, UF as tolerated.  Anemia: Hgb 9.1 - not due to ESA yet. Avoid IV Fe  Metabolic bone disease: Ca low, follow after HD. Continue VDRA.  T2DM- per primary  Outpatient Dialysis Orders:  TTS - [facility] 4hr, 450/500, EDW 89kg, 1K/2.25Ca bath, AVF + TDC (awaiting removal), heparin 500 unit bolus + 1000/hr heparin infusion - Mircera IV q 2 weeks (just given, not yet due) - Venofer  IV weekly - Calcitriol PO q HD  Anthony Sar, MD [facility]  Subjective:   S/p tdc removal this am with IR. Otherwise feels well. Does report some intermittent fevers and joint pains. He reports that he tolerated HD via his AVF yesterday, net uf 3L   Objective:   BP (!) 153/99 (BP Location: Right Arm)   Pulse 78   Temp (!) 100.7 F (38.2 C) (Oral)   Resp 18   SpO2 100%   Intake/Output Summary (Last 24 hours) at 10/27/2022 1008 Last data filed at 10/27/2022 0617 Gross per 24 hour  Intake 293 ml  Output 3000 ml  Net -2707 ml   Weight change:   Physical Exam: Gen: NAD CVS: RRR Resp: normal wob Abd: nd Ext: no edema Neuro: awake, alert Dialysis access: LUE AVF +b/t  Imaging: IR Removal Tun Cv Cath W/O FL  Result Date: 10/27/2022 INDICATION: Bacteremia. Request removal of tunneled hemodialysis catheter placed on 10/06/2022. Patient also has functional left upper extremity AV fistula. EXAM: REMOVAL OF TUNNELED RIGHT IJ HEMODIALYSIS CATHETER MEDICATIONS: None  COMPLICATIONS: None immediate. PROCEDURE: Informed written consent was obtained from the patient following an explanation of the procedure, risks, benefits and alternatives to treatment. A time out was performed prior to the initiation of the procedure. Maximal barrier sterile technique was utilized including caps, mask, sterile gowns, sterile gloves, large sterile drape, hand hygiene, and chlorhexidine. Utilizing a combination of blunt dissection and gentle traction, the cuff of the catheter was exposed and the catheter was removed intact. Hemostasis was obtained with manual compression. A dressing was placed. The patient tolerated the procedure well without immediate post procedural complication. IMPRESSION: Successful removal of tunneled right IJ hemodialysis catheter. Read by: Brayton El PA-C Electronically Signed   By: Simonne Come M.D.   On: 10/27/2022 09:54   DG Chest 2 View  Result Date: 10/26/2022 CLINICAL DATA:  Shortness of breath EXAM: CHEST - 2 VIEW COMPARISON:  Chest x-ray 10/24/2022 and older FINDINGS: Stable right IJ large-bore catheter with tip at the SVC right atrial junction region. Enlarged cardiopericardial silhouette with some vascular congestion and trace edema, increased from previous. No pneumothorax, effusion or consolidation. Overlapping cardiac leads. IMPRESSION: Developing vascular congestion and trace edema.  Right IJ line. Electronically Signed   By: Karen Kays M.D.   On: 10/26/2022 12:09    Labs: BMET Recent Labs  Lab 10/24/22 1428 10/26/22 1120 10/27/22 0423  NA 132* 131* 134*  K 3.6 3.7 3.8  CL 92* 92* 92*  CO2 GLUCOSE  122* 106* 120*  BUN 31* 57* 33*  CREATININE 10.51* 16.64* 11.32*  CALCIUM 8.0* 6.9* 7.7*  PHOS  --   --  6.1*   CBC Recent Labs  Lab 10/24/22 1428 10/26/22 1120 10/27/22 0423  WBC 7.6 5.1 4.8  NEUTROABS 6.9 3.1  --   HGB 10.5* 9.1* 9.4*  HCT 34.0* 28.3* 29.4*  MCV 81.5 78.8* 79.5*  PLT 230 158 162    Medications:      (feeding supplement) PROSource Plus  30 mL Oral BID BM   calcitRIOL  1 mcg Oral Once per day on Tue Thu Sat   carvedilol  25 mg Oral BID WC   Chlorhexidine Gluconate Cloth  6 each Topical Q0600   cloNIDine  0.1 mg Oral BID   heparin  5,000 Units Subcutaneous Q8H   insulin aspart  0-5 Units Subcutaneous QHS   insulin aspart  0-6 Units Subcutaneous TID WC   sodium chloride flush  3 mL Intravenous Q12H      Anthony Sar, MD  [facility] 10/27/2022, 10:08 AM

## 2022-10-27 NOTE — Hospital Course (Addendum)
[name] is a 37 y.o. male with medical history significant of diastolic CHF, ESRD on HD, anemia, hypertension, diabetes presenting with positive blood cultures   Patient was seen in the ED on 4/14 presenting with fever, nausea, vomiting, diarrhea for couple days.  Also feeling generally unwell at that time.  Symptoms have been ongoing since around 4/12.  He felt better in the ED and workup was overall reassuring and patient was discharged.  Blood cultures obtained to the visit have come back positive today for MSSA in both bottles.  Patient advised to come back to the ED for admission for IV antibiotics and further workup. TDC removed on 4/17.  Cardiology consulted for TEE.  ID following.

## 2022-10-27 NOTE — Progress Notes (Signed)
    [facility] has been requested to perform a transesophageal echocardiogram on [name] for valvular evaluation due to blood culture positive for staphylococcus aureus. After careful review of history and examination, the risks and benefits of transesophageal echocardiogram have been explained including risks of esophageal damage, perforation (1:10,000 risk), bleeding, pharyngeal hematoma as well as other potential complications associated with conscious sedation including aspiration, arrhythmia, respiratory failure and death. Alternatives to treatment were discussed, questions were answered. Patient is willing to proceed.   Perlie Gold PA-C 10/27/2022 4:27 PM

## 2022-10-27 NOTE — TOC Progression Note (Signed)
Transition of Care [facility]) - Progression Note    Patient Details  Name: [name] MRN: [id number] Date of Birth: June 30, 1986  Transition of [facility]) CM/SW Contact  Leone Haven, RN Phone Number: 10/27/2022, 7:54 PM  Clinical Narrative:    From home, presents with MSSA bacteremia, concern for tricuspid vegetation, TEE ordered, conts on iv abx.  TOC following.        Expected Discharge Plan and Services                                               Social Determinants of Health (SDOH) Interventions SDOH Screenings   Food Insecurity: No Food Insecurity (10/27/2022)  Recent Concern: Food Insecurity - Food Insecurity Present (10/05/2022)  Housing: Low Risk  (10/27/2022)  Recent Concern: Housing - Medium Risk (10/05/2022)  Transportation Needs: No Transportation Needs (10/27/2022)  Utilities: Not At Risk (10/27/2022)  Depression (PHQ2-9): Low Risk  (10/20/2022)  Tobacco Use: Low Risk  (10/27/2022)    Readmission Risk Interventions    03/05/2021    9:41 AM 11/27/2020    3:16 PM 02/29/2020    9:30 AM  Readmission Risk Prevention Plan  Transportation Screening Complete Complete Complete  Home Care Screening   Complete  Medication Review (RN Care Manager) Complete Complete   PCP or Specialist appointment within 3-5 days of discharge Complete Complete   HRI or Home Care Consult Complete Complete   SW Recovery Care/Counseling Consult Complete Complete   Palliative Care Screening Not Applicable Not Applicable   Skilled Nursing Facility Complete Not Applicable

## 2022-10-28 ENCOUNTER — Inpatient Hospital Stay (HOSPITAL_COMMUNITY): Payer: Medicaid Other

## 2022-10-28 ENCOUNTER — Inpatient Hospital Stay (HOSPITAL_COMMUNITY): Payer: Medicaid Other | Admitting: Anesthesiology

## 2022-10-28 ENCOUNTER — Encounter (HOSPITAL_COMMUNITY)
Admission: EM | Disposition: A | Discharge status: Home / Self Care | Payer: Self-pay | Source: Home / Self Care | Attending: Internal Medicine

## 2022-10-28 ENCOUNTER — Telehealth (HOSPITAL_BASED_OUTPATIENT_CLINIC_OR_DEPARTMENT_OTHER): Payer: Self-pay | Admitting: *Deleted

## 2022-10-28 DIAGNOSIS — E1122 Type 2 diabetes mellitus with diabetic chronic kidney disease: Secondary | ICD-10-CM | POA: Diagnosis not present

## 2022-10-28 DIAGNOSIS — I340 Nonrheumatic mitral (valve) insufficiency: Secondary | ICD-10-CM | POA: Diagnosis not present

## 2022-10-28 DIAGNOSIS — N186 End stage renal disease: Secondary | ICD-10-CM

## 2022-10-28 DIAGNOSIS — I120 Hypertensive chronic kidney disease with stage 5 chronic kidney disease or end stage renal disease: Secondary | ICD-10-CM | POA: Diagnosis not present

## 2022-10-28 DIAGNOSIS — D631 Anemia in chronic kidney disease: Secondary | ICD-10-CM

## 2022-10-28 DIAGNOSIS — R7881 Bacteremia: Secondary | ICD-10-CM | POA: Diagnosis not present

## 2022-10-28 DIAGNOSIS — Z794 Long term (current) use of insulin: Secondary | ICD-10-CM

## 2022-10-28 DIAGNOSIS — Z992 Dependence on renal dialysis: Secondary | ICD-10-CM

## 2022-10-28 DIAGNOSIS — B9561 Methicillin susceptible Staphylococcus aureus infection as the cause of diseases classified elsewhere: Secondary | ICD-10-CM | POA: Diagnosis not present

## 2022-10-28 HISTORY — PX: TEE WITHOUT CARDIOVERSION: SHX5443

## 2022-10-28 LAB — CBC
HCT: 30.3 % — ABNORMAL LOW (ref 39.0–52.0)
Hemoglobin: 9.1 g/dL — ABNORMAL LOW (ref 13.0–17.0)
MCH: 24.3 pg — ABNORMAL LOW (ref 26.0–34.0)
MCHC: 30 g/dL (ref 30.0–36.0)
MCV: 80.8 fL (ref 80.0–100.0)
Platelets: 205 10*3/uL (ref 150–400)
RBC: 3.75 MIL/uL — ABNORMAL LOW (ref 4.22–5.81)
RDW: 19.9 % — ABNORMAL HIGH (ref 11.5–15.5)
WBC: 5 10*3/uL (ref 4.0–10.5)
nRBC: 0 % (ref 0.0–0.2)

## 2022-10-28 LAB — RENAL FUNCTION PANEL
Albumin: 3.1 g/dL — ABNORMAL LOW (ref 3.5–5.0)
Anion gap: 17 — ABNORMAL HIGH (ref 5–15)
BUN: 50 mg/dL — ABNORMAL HIGH (ref 6–20)
CO2: 28 mmol/L (ref 22–32)
Calcium: 7.4 mg/dL — ABNORMAL LOW (ref 8.9–10.3)
Chloride: 88 mmol/L — ABNORMAL LOW (ref 98–111)
Creatinine, Ser: 13.72 mg/dL — ABNORMAL HIGH (ref 0.61–1.24)
GFR, Estimated: 4 mL/min — ABNORMAL LOW (ref >60)
Glucose, Bld: 136 mg/dL — ABNORMAL HIGH (ref 70–99)
Phosphorus: 6.1 mg/dL — ABNORMAL HIGH (ref 2.5–4.6)
Potassium: 3.5 mmol/L (ref 3.5–5.1)
Sodium: 133 mmol/L — ABNORMAL LOW (ref 135–145)

## 2022-10-28 LAB — CULTURE, BLOOD (ROUTINE X 2)

## 2022-10-28 LAB — POCT I-STAT, CHEM 8
BUN: 30 mg/dL — ABNORMAL HIGH (ref 6–20)
Calcium, Ion: 1 mmol/L — ABNORMAL LOW (ref 1.15–1.40)
Chloride: 94 mmol/L — ABNORMAL LOW (ref 98–111)
Creatinine, Ser: 9.3 mg/dL — ABNORMAL HIGH (ref 0.61–1.24)
Glucose, Bld: 125 mg/dL — ABNORMAL HIGH (ref 70–99)
HCT: 36 % — ABNORMAL LOW (ref 39.0–52.0)
Hemoglobin: 12.2 g/dL — ABNORMAL LOW (ref 13.0–17.0)
Potassium: 3.5 mmol/L (ref 3.5–5.1)
Sodium: 135 mmol/L (ref 135–145)
TCO2: 33 mmol/L — ABNORMAL HIGH (ref 22–32)

## 2022-10-28 LAB — GLUCOSE, CAPILLARY
Glucose-Capillary: 120 mg/dL — ABNORMAL HIGH (ref 70–99)
Glucose-Capillary: 126 mg/dL — ABNORMAL HIGH (ref 70–99)
Glucose-Capillary: 99 mg/dL (ref 70–99)

## 2022-10-28 LAB — MAGNESIUM: Magnesium: 2.2 mg/dL (ref 1.7–2.4)

## 2022-10-28 LAB — HEPATITIS B SURFACE ANTIBODY, QUANTITATIVE: Hep B S AB Quant (Post): 8500 m[IU]/mL (ref >9.9)

## 2022-10-28 LAB — ECHO TEE

## 2022-10-28 SURGERY — TRANSESOPHAGEAL ECHOCARDIOGRAM (TEE)
Anesthesia: Monitor Anesthesia Care

## 2022-10-28 MED ORDER — LIDOCAINE 2% (20 MG/ML) 5 ML SYRINGE
INTRAMUSCULAR | Status: DC | PRN
  Administered 2022-10-28: 100 mg via INTRAVENOUS

## 2022-10-28 MED ORDER — PROPOFOL 500 MG/50ML IV EMUL
INTRAVENOUS | Status: DC | PRN
  Administered 2022-10-28: 125 ug/kg/min via INTRAVENOUS

## 2022-10-28 MED ORDER — ATORVASTATIN CALCIUM 40 MG PO TABS
40.0000 mg | ORAL_TABLET | Freq: Every day | ORAL | Status: DC
  Administered 2022-10-28 – 2022-10-30 (×3): 40 mg via ORAL
  Filled 2022-10-28 (×3): qty 1

## 2022-10-28 MED ORDER — SODIUM CHLORIDE 0.9 % IV SOLN: INTRAVENOUS | Status: DC

## 2022-10-28 MED ORDER — PROPOFOL 10 MG/ML IV BOLUS
INTRAVENOUS | Status: DC | PRN
  Administered 2022-10-28 (×3): 50 mg via INTRAVENOUS

## 2022-10-28 MED ORDER — HEPARIN SODIUM (PORCINE) 1000 UNIT/ML IJ SOLN
INTRAMUSCULAR | Status: AC
  Filled 2022-10-28: qty 2

## 2022-10-28 NOTE — Telephone Encounter (Signed)
Post ED Visit - Positive Culture Follow-up  Culture report reviewed by antimicrobial stewardship pharmacist: [facility]  Enzo Bi, Pharm.D.  Celedonio Miyamoto, [address].D., BCPS AQ-ID  Garvin Fila, Pharm.D., BCPS  Georgina Pillion, Pharm.D., BCPS  [place], [address].D., BCPS, AAHIVP  Estella Husk, Pharm.D., BCPS, AAHIVP  Lysle Pearl, PharmD, BCPS  Phillips Climes, PharmD, BCPS  Agapito Games, PharmD, BCPS  Verlan Friends, PharmD  Mervyn Gay, PharmD, BCPS  Vinnie Level, PharmD  [facility]  Len Childs, PharmD  Greer Pickerel, PharmD  Adalberto Cole, PharmD  Perlie Gold, Rph  Lonell Face) Jean Rosenthal, PharmD  Earl Many, PharmD  Junita Push, PharmD  Dorna Leitz, PharmD  Terrilee Files, PharmD  Lynann Beaver, PharmD  Keturah Barre, PharmD  Loralee Pacas, PharmD  Bernadene Person, PharmD   Positive blood culture Currently an inpt at [facility] and no further patient follow-up is required at this time.  Virl Axe [facility] 10/28/2022, 11:08 AM

## 2022-10-28 NOTE — Progress Notes (Signed)
Received patient in bed to unit.  Alert and oriented.  Informed consent signed and in chart.   TX duration:3.5 h  Patient tolerated well.  Transported back to the cath lab Alert, without acute distress.  Hand-off given to patient's nurse.   Access used: AVF Access issues: none  Total UF removed: 3 L Medication(s) given: none Post HD VS: 152/84 P 66 R 20. O2 sat 100 % in room air. Post HD weight: 90.3kg   [facility]

## 2022-10-28 NOTE — Interval H&P Note (Signed)
History and Physical Interval Note:  10/28/2022 12:25 PM  [name]  has presented today for surgery, with the diagnosis of BACTEREMIA.  The various methods of treatment have been discussed with the patient and family. After consideration of risks, benefits and other options for treatment, the patient has consented to  Procedure(s): TRANSESOPHAGEAL ECHOCARDIOGRAM (N/A) as a surgical intervention.  The patient's history has been reviewed, patient examined, no change in status, stable for surgery.  I have reviewed the patient's chart and labs.  Questions were answered to the patient's satisfaction.     [name]

## 2022-10-28 NOTE — Transfer of Care (Signed)
Immediate Anesthesia Transfer of Care Note  Patient: [name]  Procedure(s) Performed: TRANSESOPHAGEAL ECHOCARDIOGRAM  Patient Location: Cath Lab  Anesthesia Type:MAC  Level of Consciousness: drowsy and patient cooperative  Airway & Oxygen Therapy: Patient connected to nasal cannula oxygen  Post-op Assessment: Report given to RN and Post -op Vital signs reviewed and stable  Post vital signs: Reviewed and stable  Last Vitals:  Vitals Value Taken Time  BP    Temp    Pulse    Resp    SpO2      Last Pain:  Vitals:   10/28/22 1135  TempSrc: (P) Oral  PainSc:       Patients Stated Pain Goal: 0 (10/26/22 2200)  Complications: No notable events documented.

## 2022-10-28 NOTE — Progress Notes (Signed)
[facility] Progress Note Patient: [name] ZOX:[id number] DOB: 07-Jul-1986 DOA: 10/26/2022  DOS: the patient was seen and examined on 10/28/2022  Brief hospital course: [name] is a 37 y.o. male with medical history significant of diastolic CHF, ESRD on HD, anemia, hypertension, diabetes presenting with positive blood cultures   Patient was seen in the ED on 4/14 presenting with fever, nausea, vomiting, diarrhea for couple days.  Also feeling generally unwell at that time.  Symptoms have been ongoing since around 4/12.  He felt better in the ED and workup was overall reassuring and patient was discharged.  Blood cultures obtained to the visit have come back positive today for MSSA in both bottles.  Patient advised to come back to the ED for admission for IV antibiotics and further workup. TDC removed on 4/17.  Cardiology consulted for TEE.  ID following.  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 Assessment and Plan: MSSA bacteremia. 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 Repeat cultures performed on 4/17 so far negative. Infectious disease following. Currently on IV cefazolin. Echocardiogram 55 to 60% EF.  5x3 mm oscillating mass on the tricuspid leaflet concerning for vegetation. TDC was removed on 4/17. Currently awaiting culture clearance.  Will follow recommendation.  ESRD on HD. TTS schedule. AVF working. Will require outpatient evaluation for a new access.  HTN. Blood pressure stable.  On Coreg 25 mg twice daily, clonidine  0.1 Mg twice daily Monitor.  Mild pulmonary edema. Resolved with HD. Monitor.  Anemia of CKD. Hemoglobin stable. Monitor.  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 Currently on sliding scale insulin. Holding long-acting insulin. Monitor.   Subjective: Seen  after TEE.  No acute complaint.  No nausea no vomiting.  Wants to shower.  Physical Exam: Clear to auscultation. S1-S2 present. Bowel sound present. No edema.  No focal deficit.  Data Reviewed: I have Reviewed nursing notes, Vitals, and Lab results. Reviewed blood cultures, CBC and BMP. Reordered CBC and BMP for Saturday  Disposition: Status is: Inpatient Remains inpatient appropriate because: Need for IV biotics and further workup  heparin injection 5,000 Units Start: 10/26/22 1400   Family Communication: No one at bedside Level of care: Med-Surg   Vitals:   10/28/22 1315 10/28/22 1325 10/28/22 1335 10/28/22 1446  BP: (!) 141/79 (!) 153/91 (!) 162/93 (!) 147/93  Pulse: 79 68 66 71  Resp: Temp: 98.1 F (36.7 C)   98.3 F (36.8 C)  TempSrc: Tympanic   Oral  SpO2: 100% 99% 100% 100%  Weight:         Author: Lynden Oxford, MD 10/28/2022 4:08 PM  Please look on [email or web address] to find out who is on call.

## 2022-10-28 NOTE — Anesthesia Postprocedure Evaluation (Signed)
Anesthesia Post Note  Patient: [name]  Procedure(s) Performed: TRANSESOPHAGEAL ECHOCARDIOGRAM     Patient location during evaluation: Cath Lab Anesthesia Type: MAC Level of consciousness: patient cooperative, oriented and sedated Pain management: pain level controlled Vital Signs Assessment: post-procedure vital signs reviewed and stable Respiratory status: spontaneous breathing, nonlabored ventilation and respiratory function stable Cardiovascular status: blood pressure returned to baseline and stable Postop Assessment: no apparent nausea or vomiting Anesthetic complications: no   No notable events documented.  Last Vitals:  Vitals:   10/28/22 1315 10/28/22 1325  BP: (!) 141/79 (!) 153/91  Pulse: 79 68  Resp: 19 20  Temp: 36.7 C   SpO2: 100% 99%    Last Pain:  Vitals:   10/28/22 1325  TempSrc:   PainSc: 0-No pain                 Dwyane Dupree,E. Caison Hearn

## 2022-10-28 NOTE — CV Procedure (Signed)
    TRANSESOPHAGEAL ECHOCARDIOGRAM   NAME:  [name]    MRN: [id number] DOB:  [birth year]    ADMIT DATE: 10/26/2022  INDICATIONS: Bacteremia   PROCEDURE:   Informed consent was obtained prior to the procedure. The risks, benefits and alternatives for the procedure were discussed and the patient comprehended these risks.  Risks include, but are not limited to, cough, sore throat, vomiting, nausea, somnolence, esophageal and stomach trauma or perforation, bleeding, low blood pressure, aspiration, pneumonia, infection, trauma to the teeth and death.    Procedural time out performed. The oropharynx was anesthetized with viscous lidocaine.  Anesthesia was administered by the anaesthesilogy team to achieve and maintain moderate to deep conscious sedation.  The patient's heart rate, blood pressure, and oxygen saturation were monitored continuously during the procedure.  The transesophageal probe was inserted in the esophagus and stomach without difficulty and multiple views were obtained.   The patient tolerated the procedure well.  COMPLICATIONS:    There were no immediate complications.  KEY FINDINGS:  Normal left ventricular systolic function, noted mobile mass on the tricuspid valve (positive for endocarditis), some tricuspid regurgitation noted, mild mitral regurgitation, no left atrial appendage clot noted. Full report to follow. Further management per primary team.   Thomasene Ripple, DO [facility]  [facility]  1:06 PM

## 2022-10-28 NOTE — Procedures (Signed)
I was present at this dialysis session. I have reviewed the session itself and made appropriate changes.   Filed Weights   10/28/22 0733  Weight: 90.3 kg    Recent Labs  Lab 10/28/22 0352  NA 133*  K 3.5  CL 88*  CO2 28  GLUCOSE 136*  BUN 50*  CREATININE 13.72*  CALCIUM 7.4*  PHOS 6.1*    Recent Labs  Lab 10/24/22 1428 10/26/22 1120 10/27/22 0423 10/28/22 0352  WBC 7.6 5.1 4.8 5.0  NEUTROABS 6.9 3.1  --   --   HGB 10.5* 9.1* 9.4* 9.1*  HCT 34.0* 28.3* 29.4* 30.3*  MCV 81.5 78.8* 79.5* 80.8  PLT 230 158 162 205    Scheduled Meds:  (feeding supplement) PROSource Plus  30 mL Oral BID BM   atorvastatin  40 mg Oral Daily   calcitRIOL  1 mcg Oral Once per day on Tue Thu Sat   carvedilol  25 mg Oral BID WC   Chlorhexidine Gluconate Cloth  6 each Topical Q0600   cloNIDine  0.1 mg Oral BID   heparin  5,000 Units Subcutaneous Q8H   heparin sodium (porcine)       insulin aspart  0-5 Units Subcutaneous QHS   insulin aspart  0-6 Units Subcutaneous TID WC   sodium chloride flush  3 mL Intravenous Q12H   Continuous Infusions:  sodium chloride 20 mL/hr at 10/28/22 0446   anticoagulant sodium citrate      ceFAZolin (ANCEF) IV 1 g (10/27/22 2144)   PRN Meds:.acetaminophen **OR** acetaminophen, alteplase, anticoagulant sodium citrate, heparin, heparin sodium (porcine), lidocaine (PF), lidocaine-prilocaine, pentafluoroprop-tetrafluoroeth, polyethylene glycol   Anthony Sar, MD [facility] 10/28/2022, 10:45 AM

## 2022-10-28 NOTE — Discharge Instructions (Signed)

## 2022-10-28 NOTE — Progress Notes (Signed)
PHARMACY CONSULT NOTE FOR:  OUTPATIENT  PARENTERAL ANTIBIOTIC THERAPY (OPAT)  Informational as the patient will receive antibiotics at his outpatient HD center  Indication: MSSA TV IE  Regimen: Cefazolin 2g/HD-TTS End date: 12/07/22 (6 weeks from neg BCx on 4/17/2)  IV antibiotic discharge orders are pended. To discharging provider:  please sign these orders via discharge navigator,  Select New Orders & click on the button choice - Manage This Unsigned Work.    Thank you for allowing pharmacy to be a part of this patient's care.  Georgina Pillion, PharmD, BCPS Infectious Diseases Clinical Pharmacist 10/29/2022 3:00 PM   **Pharmacist phone directory can now be found on [email or web address] ([email or web address]).  Listed under [facility].

## 2022-10-28 NOTE — Anesthesia Preprocedure Evaluation (Signed)
Anesthesia Evaluation  Patient identified by MRN, date of birth, ID band Patient awake    Reviewed: Allergy & Precautions, NPO status , Patient's Chart, lab work & pertinent test results, reviewed documented beta blocker date and time   History of Anesthesia Complications Negative for: history of anesthetic complications  Airway Mallampati: II  TM Distance: >3 FB Neck ROM: Full    Dental  (+) Dental Advisory Given   Pulmonary neg pulmonary ROS   breath sounds clear to auscultation       Cardiovascular hypertension, Pt. on medications and Pt. on home beta blockers  Rhythm:Regular Rate:Normal  10/27/2022 ECHO: EF 55-60%, normal LVF, mod LVH, grade 3 DD, normal RVF, severe pulm HTN. Trivial MR, mass on anterior leaflet tricuspid with mild-mod TR   Neuro/Psych  PSYCHIATRIC DISORDERS (ADHD)      negative neurological ROS     GI/Hepatic Neg liver ROS,GERD  Medicated,,  Endo/Other  diabetes (glu 136), Insulin Dependent    Renal/GU Dialysis and ESRFRenal disease (dialyzed today, K+ 3.5)     Musculoskeletal   Abdominal   Peds  Hematology  (+) Blood dyscrasia (Hb 9.1), anemia   Anesthesia Other Findings   Reproductive/Obstetrics                             Anesthesia Physical Anesthesia Plan  ASA: 3  Anesthesia Plan: MAC   Post-op Pain Management: Minimal or no pain anticipated   Induction:   PONV Risk Score and Plan: 1 and Treatment may vary due to age or medical condition  Airway Management Planned: Natural Airway and Nasal Cannula  Additional Equipment: None  Intra-op Plan:   Post-operative Plan:   Informed Consent: I have reviewed the patients History and Physical, chart, labs and discussed the procedure including the risks, benefits and alternatives for the proposed anesthesia with the patient or authorized representative who has indicated his/her understanding and acceptance.      Dental advisory given  Plan Discussed with: CRNA and Surgeon  Anesthesia Plan Comments:        Anesthesia Quick Evaluation

## 2022-10-28 NOTE — Progress Notes (Signed)
[facility] Infectious Diseases Follow Up Note  Patient Identification: Patient Name: [name] MRN: [id number] Admit Date: 10/26/2022 10:17 AM Age: 37 y.o.Today's Date: 10/28/2022  Reason for Visit: MSSA bacteremia   Principal Problem:   MSSA bacteremia Active Problems:   Anemia   Essential hypertension   Type 2 diabetes mellitus with ESRD (end-stage renal disease)   ESRD (end stage renal disease) on dialysis   Hemodialysis catheter infection   Antibiotics: Cefazolin 4/16-c  Lines/Hardwares: Left arm AVF  Interval Events:  fevers resolved, TEE today   Assessment 44 Y O male with PMH as below including DM2,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, left distal radius ORIF, ESRD on HD, recent admission end of March for left AV fistula malfunction with non occlusive thrombus remaining after IR declotting and Rt IJ HD catheter placed on 3/26  and plan was for open thrombectomy but patient refused and s/p RT IJ HD catheter placement with plan to fu witrh Vascular OP called back after blood cx on 4/14 both sets + for MSSA   # Complicated MSSA bacteremia likely 2/2 rt IJ HDC - s/p HDC removal by IR 4/17 - blood cx 4/16 2/2 sets NG MSSA in 1/4 bottles  - blood cx 4/17 NG in less than 24 hrs  - TTE 4/17 with small oscillating echodensity  on the anterior tricuspid leaflet. This is favored to represent redundant tissue, rather than a vegetation. Cannot r/o vegetation  - TEE 4/18 small mobile mass on Ane=terior miyral leaflet, Mobile mass on TV, very oscillating mass on AV  # ESRD on HD  - Nephrology following  Recommendations Continue cefazolin as is Fu blood cx for clearance and sensi Plan for 6 weeks of IV cefazolin from negative blood cx if blood cx 4/17 stay negative CVTS eval for completeness given multiple possible vegetations for completeness.  Monitor for metastatic sites of infection  Monitor CBC and BMP  Rest of the management as per  the primary team. Thank you for the consult. Please page with pertinent questions or concerns.  ______________________________________________________________________ Subjective Patient in TEE  Vitals BP (!) 147/93 (BP Location: Right Arm)   Pulse 71   Temp 98.3 F (36.8 C) (Oral)   Resp 17   Wt 90.3 kg   SpO2 100%   BMI 27.00 kg/m   Pertinent Microbiology Results for orders placed or performed during the hospital encounter of 10/26/22  Blood culture (routine x 2)     Status: None (Preliminary result)   Collection Time: 10/26/22 11:20 AM   Specimen: BLOOD RIGHT ARM  Result Value Ref Range Status   Specimen Description BLOOD RIGHT ARM  Final   Special Requests   Final    BOTTLES DRAWN AEROBIC AND ANAEROBIC Blood Culture adequate volume   Culture   Final    NO GROWTH 2 DAYS Performed at [facility], [address], [place], [place] [address]    Report Status PENDING  Incomplete  Blood culture (routine x 2)     Status: Abnormal   Collection Time: 10/26/22 11:27 AM   Specimen: BLOOD  Result Value Ref Range Status   Specimen Description BLOOD FOREARM  Final   Special Requests   Final    BOTTLES DRAWN AEROBIC AND ANAEROBIC Blood Culture results may not be optimal due to an excessive volume of blood received in culture bottles   Culture  Setup Time   Final    GRAM POSITIVE COCCI AEROBIC BOTTLE ONLY CRITICAL VALUE NOTED.  VALUE IS  CONSISTENT WITH PREVIOUSLY REPORTED AND CALLED VALUE.    Culture (A)  Final    STAPHYLOCOCCUS AUREUS SUSCEPTIBILITIES PERFORMED ON PREVIOUS CULTURE WITHIN THE LAST 5 DAYS. Performed at [facility], [address], [place], [place] [address]    Report Status 10/28/2022 FINAL  Final  Culture, blood (Routine X 2) w Reflex to ID Panel     Status: None (Preliminary result)   Collection Time: 10/27/22  2:50 PM   Specimen: BLOOD RIGHT ARM  Result Value Ref Range Status   Specimen Description BLOOD RIGHT ARM  Final   Special Requests    Final    BOTTLES DRAWN AEROBIC AND ANAEROBIC Blood Culture adequate volume   Culture   Final    NO GROWTH < 24 HOURS Performed at [facility], [address], [place], [place] [address]    Report Status PENDING  Incomplete  Culture, blood (Routine X 2) w Reflex to ID Panel     Status: None (Preliminary result)   Collection Time: 10/27/22  2:50 PM   Specimen: BLOOD  Result Value Ref Range Status   Specimen Description BLOOD RIGHT ANTECUBITAL  Final   Special Requests   Final    BOTTLES DRAWN AEROBIC AND ANAEROBIC Blood Culture adequate volume   Culture   Final    NO GROWTH < 24 HOURS Performed at [facility], [address] [place], [place] [address]    Report Status PENDING  Incomplete    Pertinent Lab.    Latest Ref Rng & Units 10/28/2022   12:39 PM 10/28/2022    3:52 AM 10/27/2022    4:23 AM  CBC  WBC 4.0 - 10.5 K/uL  5.0  4.8   Hemoglobin 13.0 - 17.0 g/dL 91.4  9.1  9.4   Hematocrit 39.0 - 52.0 % 36.0  30.3  29.4   Platelets 150 - 400 K/uL  205  162       Latest Ref Rng & Units 10/28/2022   12:39 PM 10/28/2022    3:52 AM 10/27/2022    4:23 AM  CMP  Glucose 70 - 99 mg/dL 782  956  213   BUN 6 - 20 mg/dL 30  50  33   Creatinine 0.61 - 1.24 mg/dL 0.86  57.84  69.62   Sodium 135 - 145 mmol/L 135  133  134   Potassium 3.5 - 5.1 mmol/L 3.5  3.5  3.8   Chloride 98 - 111 mmol/L 94  88  92   CO2 22 - 32 mmol/L  28  28   Calcium 8.9 - 10.3 mg/dL  7.4  7.7      Pertinent Imaging today Plain films and CT images have been personally visualized and interpreted; radiology reports have been reviewed. Decision making incorporated into the Impression / Recommendations.  ECHO TEE  Result Date: 10/28/2022    TRANSESOPHOGEAL ECHO REPORT   Patient Name:   [name] Date of Exam: 10/28/2022 Medical Rec #:  [id number]       Height:       72.0 in Accession #:    [id number]      Weight:       199.1 lb Date of Birth:  03-15-1986       BSA:          2.126 m Patient Age:     36 years        BP:  185/98 mmHg Patient Gender: M               HR:           70 bpm. Exam Location:  Inpatient Procedure: Transesophageal Echo, 3D Echo, Color Doppler and Cardiac Doppler Indications:     Endocarditis  History:         Patient has prior history of Echocardiogram examinations, most                  recent 10/27/2022. Risk Factors:Hypertension, Diabetes,                  Dyslipidemia and ESRD.  Sonographer:     Irving Burton Senior RDCS Referring Phys:  [id number] Perlie Gold Diagnosing Phys: Thomasene Ripple DO PROCEDURE: After discussion of the risks and benefits of a TEE, an informed consent was obtained from the patient. The transesophogeal probe was passed without difficulty through the esophogus of the patient. Sedation performed by different physician. The patient was monitored while under deep sedation. Anesthestetic sedation was provided intravenously by Anesthesiology:  of Propofol,  of Lidocaine. The patient developed no complications during the procedure.  IMPRESSIONS  1. Left ventricular ejection fraction, by estimation, is 55 to 60%. The left ventricle has normal function. Left ventricular diastolic function could not be evaluated.  2. Right ventricular systolic function is normal. The right ventricular size is normal.  3. No left atrial/left atrial appendage thrombus was detected.  4. A small pericardial effusion is present. The pericardial effusion is localized near the right atrium and anterior to the right ventricle.  5. Small mobile mass noted on the anterior mitral leaflet ( best seen frame 77) suspected to be a vegetation in the setting of bactermia. Mild mitral valve regurgitation. No evidence of mitral stenosis.  6. Mobile mass on the tricuspid valve - suspect this may be on the chordae not clearly seen on the valve leaflet, suspected to be a vegetation in the setting of bactermia.  7. Very small oscillating mass on the left coronary cusp of the aortic valve suspected to be  a vegetation in the setting of bactermia. Aortic valve regurgitation is trivial. No aortic stenosis is present.  8. Mild pulmonic stenosis. Conclusion(s)/Recommendation(s): Findings are concerning for vegetation/infective endocarditis as detailed above. FINDINGS  Left Ventricle: Left ventricular ejection fraction, by estimation, is 55 to 60%. The left ventricle has normal function. The left ventricular internal cavity size was normal in size. Left ventricular diastolic function could not be evaluated. Right Ventricle: The right ventricular size is normal. No increase in right ventricular wall thickness. Right ventricular systolic function is normal. Left Atrium: Left atrial size was not well visualized. No left atrial/left atrial appendage thrombus was detected. Right Atrium: Right atrial size was not well visualized. Pericardium: A small pericardial effusion is present. The pericardial effusion is localized near the right atrium and anterior to the right ventricle. Mitral Valve: Small mobile mass noted on the anterior mitral leaflet ( best seen frame 77) suspected to be a vegetation in the setting of bactermia. Mild mitral valve regurgitation. No evidence of mitral valve stenosis. Tricuspid Valve: Mobile mass on the tricuspid valve - suspect this may be on the chordae not clearly seen on the valve leaflet, suspected to be a vegetation in the setting of bactermia. Aortic Valve: Very small oscillating mass on the left coronary cusp of the aortic valve suspected to be a vegetation in the setting of bactermia. Aortic valve regurgitation is trivial. No aortic  stenosis is present. Pulmonic Valve: The pulmonic valve was normal in structure. Pulmonic valve regurgitation is not visualized. Mild pulmonic stenosis. Aorta: The aortic root and ascending aorta are structurally normal, with no evidence of dilitation. Pulmonary Artery: The pulmonary artery is of normal size. Venous: The left upper pulmonary vein, left lower  pulmonary vein, right upper pulmonary vein and right lower pulmonary vein are normal. A normal flow pattern is recorded from the right upper pulmonary vein. IAS/Shunts: No atrial level shunt detected by color flow Doppler. Additional Comments: Spectral Doppler performed. Thomasene Ripple DO Electronically signed by Thomasene Ripple DO Signature Date/Time: 10/28/2022/1:53:25 PM    Final    EP STUDY  Result Date: 10/28/2022 See surgical note for result.    I spent at least 45 minutes for this patient encounter including review of prior medical records, coordination of care with primary/other specialist with greater than 50% of time being face to face/counseling and discussing diagnostics/treatment plan with the patient/family.  Electronically signed by:   Odette Fraction, MD Infectious Disease Physician [facility] Pager: [phone]

## 2022-10-29 ENCOUNTER — Encounter (HOSPITAL_COMMUNITY): Payer: Self-pay | Admitting: Cardiology

## 2022-10-29 DIAGNOSIS — I340 Nonrheumatic mitral (valve) insufficiency: Secondary | ICD-10-CM

## 2022-10-29 DIAGNOSIS — I361 Nonrheumatic tricuspid (valve) insufficiency: Secondary | ICD-10-CM

## 2022-10-29 DIAGNOSIS — B9561 Methicillin susceptible Staphylococcus aureus infection as the cause of diseases classified elsewhere: Secondary | ICD-10-CM | POA: Diagnosis not present

## 2022-10-29 DIAGNOSIS — I351 Nonrheumatic aortic (valve) insufficiency: Secondary | ICD-10-CM

## 2022-10-29 DIAGNOSIS — R7881 Bacteremia: Secondary | ICD-10-CM | POA: Diagnosis not present

## 2022-10-29 LAB — CULTURE, BLOOD (ROUTINE X 2): Culture: NO GROWTH

## 2022-10-29 LAB — GLUCOSE, CAPILLARY
Glucose-Capillary: 135 mg/dL — ABNORMAL HIGH (ref 70–99)
Glucose-Capillary: 140 mg/dL — ABNORMAL HIGH (ref 70–99)
Glucose-Capillary: 102 mg/dL — ABNORMAL HIGH (ref 70–99)
Glucose-Capillary: 164 mg/dL — ABNORMAL HIGH (ref 70–99)

## 2022-10-29 MED ORDER — CEFAZOLIN SODIUM-DEXTROSE 2-4 GM/100ML-% IV SOLN
2.0000 g | INTRAVENOUS | Status: DC
  Administered 2022-10-30: 2 g via INTRAVENOUS
  Filled 2022-10-29: qty 100

## 2022-10-29 NOTE — Progress Notes (Addendum)
ID Brief Note   Afebrile, no leukocytosis Blood cx 4/17 NG in 2 days  CTVS - recommended conservative management   Monitor 4/17 blood cx to be negative until 3 days till tomorrow. If so/ no new concerns, plan would be 6 weeks of cefazolin via HD from 4/17. EOT 12/07/22. ID fu arranged for 5/14 at 9: 15 am at [facility]  In case blood cx 4/17 come back positive, call us back.   ID will so, please call with questions.   Odette Fraction, MD Infectious Disease Physician [facility] [address] [place], [place] [address] Phone: [phone]  Fax: [phone]

## 2022-10-29 NOTE — Progress Notes (Signed)
[facility] Progress Note Patient: [name] ZOX:[id number] DOB: [birth year] DOA: 10/26/2022  DOS: the patient was seen and examined on 10/29/2022  Brief hospital course: HOWIE RUFUS is a 37 y.o. male with medical history significant of diastolic CHF, ESRD on HD, anemia, hypertension, diabetes presenting with positive blood cultures   Patient was seen in the ED on 4/14 presenting with fever, nausea, vomiting, diarrhea for couple days.  Also feeling generally unwell at that time.  Symptoms have been ongoing since around 4/12.  He felt better in the ED and workup was overall reassuring and patient was discharged.  Blood cultures obtained to the visit have come back positive today for MSSA in both bottles.  Patient advised to come back to the ED for admission for IV antibiotics and further workup. TDC removed on 4/17.  Cardiology consulted for TEE.  ID following.  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 Assessment and Plan: MSSA bacteremia. 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 Repeat cultures performed on 4/17 so far negative. Infectious disease following. Currently on IV cefazolin. Echocardiogram 55 to 60% EF.  5x3 mm oscillating mass on the tricuspid leaflet concerning for vegetation. TDC was removed on 4/17. TTE showed tricuspid vegetation.  TEE confirmed the tricuspid vegetation as well as found to have vegetations in mitral and aortic area as well. At the request of ID, CT surgery was consulted, recommend observation and IV antibiotics. Currently awaiting culture clearance.  Will await final ID recommendation for discharge.   ESRD on HD. TTS schedule. AVF working. Will require outpatient evaluation for a new access.   HTN. Blood pressure stable.  On Coreg 25 mg twice daily, clonidine  0.1 Mg twice daily Monitor.   Mild pulmonary  edema. Resolved with HD. Monitor.   Anemia of CKD. Hemoglobin stable. Monitor.   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 Currently on sliding scale insulin. Holding long-acting insulin. Monitor.    Subjective: No acute complaint.  No nausea no vomiting.  Physical Exam: General: in Mild distress, No Rash Cardiovascular: S1 and S2 Present, No Murmur Respiratory: Good respiratory effort, Bilateral Air entry present. No Crackles, No wheezes Abdomen: Bowel Sound present, No tenderness Extremities: No edema Neuro: Alert and oriented x3, no new focal deficit  Data Reviewed: I have Reviewed nursing notes, Vitals, and Lab results. Since last encounter, pertinent lab results CBG   . I have ordered test including CBC and BMP  .   Disposition: Status is: Inpatient Remains inpatient appropriate because: Awaiting ID clearance for outpatient antibiotic therapy.  heparin injection 5,000 Units Start: 10/26/22 1400   Family Communication: No one at bedside Level of care: Med-Surg   Vitals:   10/28/22 1950 10/29/22 0556 10/29/22 0747 10/29/22 1533  BP: 138/83 (!) 158/89 (!) 148/92 (!) 143/84  Pulse: 65 66 68 69  Resp: Temp: 98.4 F (36.9 C) 97.8 F (36.6 C) 97.9 F (36.6 C) 97.9 F (36.6 C)  TempSrc: Oral Oral    SpO2: 99% 98% 100% 100%  Weight:         Author: Lynden Oxford, MD 10/29/2022 3:37 PM  Please look on [email or web address] to find out who is on call.

## 2022-10-29 NOTE — Plan of Care (Signed)
1100: Pt refusing bed alarm, pt stating he can ambulate on his own, pt stating he feels better than yesterday and able to walk on his own , pt took a shower during shift.

## 2022-10-29 NOTE — Consult Note (Addendum)
[address]       [place] [address]             [phone]        [facility] Medical Record #[id number] Date of Birth: Aug 27, 1985  Referring: No ref. provider found Primary Care: Rema Fendt, NP Primary Cardiologist:Mark Anne Fu, MD  Chief Complaint: Fevers  History of Present Illness:    We are asked to see this patient in cardiothoracic surgical consultation for evaluation of endocarditis.  The patient is a 37 year old male with a significant medical history including systolic congestive heart failure, end-stage renal disease on hemodialysis, anemia, hypertension, and diabetes who presented with blood cultures after presenting to the emergency department.  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  He presented on 4/12 to ER with fevers, nausea, vomiting and diarrhea for a few days.  He was generally been feeling unwell.  He felt that the symptoms began since approximately 10/22/2022 and blood cultures returned from the emergency department showed MSSA in both bottles.  He was initially discharged from the ED but upon these findings of positive blood cultures he was told to return and was admitted for intravenous antibiotics.  In the emergency room upon his return he was found to be hypertensive.  His creatinine was significantly elevated to 16.6 in the setting of end-stage renal disease.  His hemoglobin was down to 9.1 which was somewhat lower than his baseline.  He was not shown to have a leukocytosis.  Transesophageal echocardiogram has been performed on 10/28/2022 which revealed a small mobile mass noted on the anterior mitral valve leaflet which was suspected to be a vegetation in the setting of bacteremia.  Mild mitral valve regurgitation was noted and there is no evidence of stenosis.  Additionally, there was a, there was a mobile mass on the tricuspid valve possibly on the chordae and not the valve leaflet it self.  This is also  suspected to be a vegetation.  Also there was a very small oscillating mass on the left coronary cusp of the aortic valve suspected to be a vegetation in the setting of bacteremia.  There was only trivial aortic valve regurgitation and no aortic stenosis.  He did have a percutaneous dialysis catheter that has been removed.  He does have a functioning fistula in his left arm.    Current Activity/ Functional Status: Patient was independent with mobility/ambulation, transfers, ADL's, IADL's.   Zubrod Score: At the time of surgery this patient's most appropriate activity status/level should be described as: []     0    Normal activity, no symptoms [x]     1    Restricted in physical strenuous activity but ambulatory, able to do out light work []     2    Ambulatory and capable of self care, unable to do work activities, up and about                 more than 50%  Of the time                            []     3    Only limited self care, in bed greater than 50% of waking hours []     4    Completely disabled, no self care, confined to bed or chair []     5    Moribund  Past Medical  History:  Diagnosis Date   ADHD (attention deficit hyperactivity disorder)    Diabetes mellitus    ESRD on hemodialysis    [facility]   High cholesterol    Hypertension    Hypertrophic cardiomyopathy 02/26/2021   Seizure 02/08/2021    Past Surgical History:  Procedure Laterality Date   AMPUTATION TOE Right 12/29/2021   Procedure: AMPUTATION TOE, fifth;  Surgeon: Louann Sjogren, DPM;  Location: [facility] OR;  Service: Podiatry;  Laterality: Right;  surgical team will do local block   AV FISTULA PLACEMENT Left 08/11/2020   Procedure: LEFT UPPER EXTREMITY ARTERIOVENOUS (AV) FISTULA CREATION;  Surgeon: Leonie Douglas, MD;  Location: [facility] OR;  Service: Vascular;  Laterality: Left;   FISTULA SUPERFICIALIZATION Left 12/02/2021   Procedure: PLICATION OF LEFT RADIUS CEPHALIC FISTULA;  Surgeon: Leonie Douglas, MD;   Location: [facility] OR;  Service: Vascular;  Laterality: Left;  PERIPHERAL NERVE BLOCK   FRACTURE SURGERY     I & D EXTREMITY Left 07/16/2014   Procedure: IRRIGATION AND DEBRIDEMENT EXTREMITY/LEFT INDEX FINGER;  Surgeon: Betha Loa, MD;  Location: [facility] OR;  Service: Orthopedics;  Laterality: Left;   IR FLUORO GUIDE CV LINE RIGHT  10/04/2022   IR FLUORO GUIDE CV LINE RIGHT  10/05/2022   IR PERC TUN PERIT CATH WO PORT S&I /IMAG  08/07/2020   IR REMOVAL TUN CV CATH W/O FL  10/27/2022   IR THROMBECTOMY AV FISTULA W/THROMBOLYSIS/PTA INC/SHUNT/IMG LEFT Left 10/05/2022   IR US GUIDE VASC ACCESS RIGHT  08/07/2020   IR US GUIDE VASC ACCESS RIGHT  10/04/2022   IR US GUIDE VASC ACCESS RIGHT  10/05/2022   TEE WITHOUT CARDIOVERSION N/A 10/28/2022   Procedure: TRANSESOPHAGEAL ECHOCARDIOGRAM;  Surgeon: Thomasene Ripple, DO;  Location: [facility] INVASIVE CV LAB;  Service: Cardiovascular;  Laterality: N/A;    Social History   Tobacco Use  Smoking Status Never   Passive exposure: Never  Smokeless Tobacco Never    Social History   Substance and Sexual Activity  Alcohol Use Not Currently   Comment: social drinker     Allergies  Allergen Reactions   Amlodipine Nausea And Vomiting and Other (See Comments)    Patient was taking Amlodipine and Hydralazine at the same time, so the reactions came from one of the 2: Lethargy and an all-over feeling of NOT feeling well (also)   Hydralazine Nausea And Vomiting and Other (See Comments)    Patient was taking Hydralazine AND Amlodipine at the same time, so the reactions came from one of the 2: Lethargy and an all-over feeling of NOT feeling well (also)    Current Facility-Administered Medications  Medication Dose Route Frequency Provider Last Rate Last Admin   (feeding supplement) PROSource Plus liquid 30 mL  30 mL Oral BID BM Julien Nordmann, PA-C       acetaminophen (TYLENOL) tablet 650 mg  650 mg Oral Q6H PRN Synetta Fail, MD   650 mg at 10/27/22 1004   Or    acetaminophen (TYLENOL) suppository 650 mg  650 mg Rectal Q6H PRN Synetta Fail, MD       atorvastatin (LIPITOR) tablet 40 mg  40 mg Oral Daily [name], MD   40 mg at 10/29/22 1610   calcitRIOL (ROCALTROL) capsule 1 mcg  1 mcg Oral Once per day on Tue Thu Sat Julien Nordmann, PA-C   1 mcg at 10/26/22 1543   carvedilol (COREG) tablet 25 mg  25 mg Oral BID WC Alinda Money,  Cecille Po, MD   25 mg at 10/29/22 0816   ceFAZolin (ANCEF) IVPB 1 g/50 mL premix  1 g Intravenous Q24H [name] Q, RPH-CPP   Stopped at 10/28/22 2249   [START ON 10/30/2022] ceFAZolin (ANCEF) IVPB 2g/100 mL premix  2 g Intravenous Q T,Th,Sa-HD Pham, Minh Q, RPH-CPP       Chlorhexidine Gluconate Cloth 2 % PADS 6 each  6 each Topical Q0600 Julien Nordmann, PA-C   6 each at 10/29/22 1610   cloNIDine (CATAPRES) tablet 0.1 mg  0.1 mg Oral BID Synetta Fail, MD   0.1 mg at 10/29/22 0817   heparin injection 5,000 Units  5,000 Units Subcutaneous Q8H Synetta Fail, MD   5,000 Units at 10/29/22 0551   insulin aspart (novoLOG) injection 0-5 Units  0-5 Units Subcutaneous QHS John Giovanni, MD       insulin aspart (novoLOG) injection 0-6 Units  0-6 Units Subcutaneous TID WC John Giovanni, MD   1 Units at 10/27/22 1314   polyethylene glycol (MIRALAX / GLYCOLAX) packet 17 g  17 g Oral Daily PRN Synetta Fail, MD       sodium chloride flush (NS) 0.9 % injection 3 mL  3 mL Intravenous Q12H Synetta Fail, MD   3 mL at 10/29/22 0817    Medications Prior to Admission  Medication Sig Dispense Refill Last Dose   acetaminophen (TYLENOL) 325 MG tablet Take 2 tablets (650 mg total) by mouth every 6 (six) hours as needed for mild pain (or Fever >/= 101).      atorvastatin (LIPITOR) 40 MG tablet Take 40 mg by mouth daily.      B Complex-C-Folic Acid (RENA-VITE RX) 1 MG TABS Take 1 tablet by mouth daily.      calcitRIOL (ROCALTROL) 0.5 MCG capsule Take 2 capsules (1 mcg total) by mouth Every  Tuesday,Thursday,and Saturday with dialysis. 30 capsule 1    carvedilol (COREG) 25 MG tablet Take 1 tablet (25 mg total) by mouth 2 (two) times daily with a meal.      cloNIDine (CATAPRES) 0.1 MG tablet Take 0.1 mg by mouth 2 (two) times daily.      insulin glargine-yfgn (SEMGLEE) 100 UNIT/ML injection Inject 0.12 mLs (12 Units total) into the skin at bedtime. 10 mL 0    insulin lispro (HUMALOG) 100 UNIT/ML injection Inject 1-7 Units into the skin See admin instructions. Per sliding scale 3 times daily with meals 151-200= 1 unit 201-250= 2 units 251-300= 3 units 301-350= 5 units 351-400= 7 units Greater than 400 call md takes only as needed if blood sugar is over 200      Insulin Pen Needle (PEN NEEDLES) 31G X 8 MM MISC UAD 100 each 0    Multiple Vitamins-Minerals (MULTIVITAMIN WITH MINERALS) tablet Take 1 tablet by mouth daily.      multivitamin (RENA-VIT) TABS tablet Take 1 tablet by mouth at bedtime. (Patient not taking: Reported on 10/30/2021) 30 tablet 0    ondansetron (ZOFRAN) 4 MG tablet Take 4 mg by mouth every 8 (eight) hours as needed for nausea or vomiting.      prednisoLONE acetate (PRED FORTE) 1 % ophthalmic suspension Place 1 drop into the left eye See admin instructions. 2-3 times daily      sevelamer carbonate (RENVELA) 800 MG tablet Take 2 tablets (1,600 mg total) by mouth 3 (three) times daily with meals. (Patient not taking: Reported on 12/29/2021)       Family History  Problem Relation Age of Onset   Cancer Mother    Stroke Father    Diabetes Father    Hypertension Father      Review of Systems:   Review of Systems  Constitutional:  Positive for fever and malaise/fatigue.  Eyes:        He is significantly visually impaired and states he can see light.  His right lens is opacified completely and left is near opacified  Cardiovascular:  Positive for chest pain.  Gastrointestinal: Negative.   Musculoskeletal:  Positive for myalgias.  Neurological:  Positive for  weakness.  Psychiatric/Behavioral:  Positive for depression.         Physical Exam: BP (!) 148/92 (BP Location: Right Arm)   Pulse 68   Temp 97.9 F (36.6 C)   Resp 18   Wt 90.3 kg   SpO2 100%   BMI 27.00 kg/m    General appearance: alert, cooperative, fatigued, and no distress Head: Normocephalic, without obvious abnormality, atraumatic Neck: no adenopathy, no carotid bruit, no JVD, supple, symmetrical, trachea midline, and thyroid not enlarged, symmetric, no tenderness/mass/nodules Lymph nodes: Cervical, supraclavicular, and axillary nodes normal. Resp: clear to auscultation bilaterally Back: symmetric, no curvature. ROM normal. No CVA tenderness. Cardio: regular rate and rhythm and no murmur GI: benign exam Extremities: extremities normal, atraumatic, no cyanosis or edema Neurologic: Grossly normal Skin: Significant old acne scars, no active appearing infection Dentition: Grossly appears normal without obvious evidence of infection Diagnostic Studies & Laboratory data:     Recent Radiology Findings:   ECHO TEE  Result Date: 10/28/2022    TRANSESOPHOGEAL ECHO REPORT   Patient Name:   NIALL ILLES Vink Date of Exam: 10/28/2022 Medical Rec #:  [id number]       Height:       72.0 in Accession #:    [id number]      Weight:       199.1 lb Date of Birth:  Dec 02, 1985       BSA:          2.126 m Patient Age:    36 years        BP:           185/98 mmHg Patient Gender: M               HR:           70 bpm. Exam Location:  Inpatient Procedure: Transesophageal Echo, 3D Echo, Color Doppler and Cardiac Doppler Indications:     Endocarditis  History:         Patient has prior history of Echocardiogram examinations, most                  recent 10/27/2022. Risk Factors:Hypertension, Diabetes,                  Dyslipidemia and ESRD.  Sonographer:     Irving Burton Senior RDCS Referring Phys:  [id number] Perlie Gold Diagnosing Phys: Thomasene Ripple DO PROCEDURE: After discussion of the risks and benefits of a TEE,  an informed consent was obtained from the patient. The transesophogeal probe was passed without difficulty through the esophogus of the patient. Sedation performed by different physician. The patient was monitored while under deep sedation. Anesthestetic sedation was provided intravenously by Anesthesiology: 380mg  of Propofol, 100mg  of Lidocaine. The patient developed no complications during the procedure.  IMPRESSIONS  1. Left ventricular ejection fraction, by estimation, is 55 to 60%. The left ventricle has normal function. Left ventricular diastolic  function could not be evaluated.  2. Right ventricular systolic function is normal. The right ventricular size is normal.  3. No left atrial/left atrial appendage thrombus was detected.  4. A small pericardial effusion is present. The pericardial effusion is localized near the right atrium and anterior to the right ventricle.  5. Small mobile mass noted on the anterior mitral leaflet ( best seen frame 77) suspected to be a vegetation in the setting of bactermia. Mild mitral valve regurgitation. No evidence of mitral stenosis.  6. Mobile mass on the tricuspid valve - suspect this may be on the chordae not clearly seen on the valve leaflet, suspected to be a vegetation in the setting of bactermia.  7. Very small oscillating mass on the left coronary cusp of the aortic valve suspected to be a vegetation in the setting of bactermia. Aortic valve regurgitation is trivial. No aortic stenosis is present.  8. Mild pulmonic stenosis. Conclusion(s)/Recommendation(s): Findings are concerning for vegetation/infective endocarditis as detailed above. FINDINGS  Left Ventricle: Left ventricular ejection fraction, by estimation, is 55 to 60%. The left ventricle has normal function. The left ventricular internal cavity size was normal in size. Left ventricular diastolic function could not be evaluated. Right Ventricle: The right ventricular size is normal. No increase in right  ventricular wall thickness. Right ventricular systolic function is normal. Left Atrium: Left atrial size was not well visualized. No left atrial/left atrial appendage thrombus was detected. Right Atrium: Right atrial size was not well visualized. Pericardium: A small pericardial effusion is present. The pericardial effusion is localized near the right atrium and anterior to the right ventricle. Mitral Valve: Small mobile mass noted on the anterior mitral leaflet ( best seen frame 77) suspected to be a vegetation in the setting of bactermia. Mild mitral valve regurgitation. No evidence of mitral valve stenosis. Tricuspid Valve: Mobile mass on the tricuspid valve - suspect this may be on the chordae not clearly seen on the valve leaflet, suspected to be a vegetation in the setting of bactermia. Aortic Valve: Very small oscillating mass on the left coronary cusp of the aortic valve suspected to be a vegetation in the setting of bactermia. Aortic valve regurgitation is trivial. No aortic stenosis is present. Pulmonic Valve: The pulmonic valve was normal in structure. Pulmonic valve regurgitation is not visualized. Mild pulmonic stenosis. Aorta: The aortic root and ascending aorta are structurally normal, with no evidence of dilitation. Pulmonary Artery: The pulmonary artery is of normal size. Venous: The left upper pulmonary vein, left lower pulmonary vein, right upper pulmonary vein and right lower pulmonary vein are normal. A normal flow pattern is recorded from the right upper pulmonary vein. IAS/Shunts: No atrial level shunt detected by color flow Doppler. Additional Comments: Spectral Doppler performed. Thomasene Ripple DO Electronically signed by Thomasene Ripple DO Signature Date/Time: 10/28/2022/1:53:25 PM    Final    EP STUDY  Result Date: 10/28/2022 See surgical note for result.    I have independently reviewed the above radiologic studies and discussed with the patient   Recent Lab Findings: Lab Results   Component Value Date   WBC 5.0 10/28/2022   HGB 12.2 (L) 10/28/2022   HCT 36.0 (L) 10/28/2022   PLT 205 10/28/2022   GLUCOSE 125 (H) 10/28/2022   CHOL 146 09/16/2020   TRIG 82 09/16/2020   HDL 56 09/16/2020   LDLCALC 74 09/16/2020   ALT 19 10/26/2022   AST 13 (L) 10/26/2022   NA 135 10/28/2022   K 3.5 10/28/2022  CL 94 (L) 10/28/2022   CREATININE 9.30 (H) 10/28/2022   BUN 30 (H) 10/28/2022   CO2 28 10/28/2022   TSH 1.364 11/25/2020   INR 1.2 10/24/2022   HGBA1C 6.0 (A) 07/09/2022      Assessment / Plan: 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 ESRD on hemodialysis Hypertension History of diastolic heart failure History of seizures History of ADHD Diabetes mellitus Near blindness  Plan: The surgeon will review the TEE, but there is likely no surgical intervention required at this time and will continue with medical management/IV antibiotics     I  spent 30 minutes counseling the patient face to face.  Rowe Clack, PA-C   10/29/2022 12:25 PM  DR Delia Chimes ADDENDUM  Seen and discussed.  Plan is for observation.   Does not meet criteria for endocarditis operation.   No severe valve dysfunction, no heart failure, no periaortic abscess.   Findings can be followed as needed with sequential echocardiography.   Will sign off for now  Call back CT Surg service if further questions.   Clare Charon MD

## 2022-10-29 NOTE — Progress Notes (Signed)
Responded to page to provided ED and Advance Directive form to patient.  Ed done and form left with patient.  Will call if needed further.  Venida Jarvis, [place], [facility], Pager [phone]

## 2022-10-29 NOTE — Progress Notes (Signed)
Pt to require iv cefazolin with HD at d/c. Contacted [facility] and spoke to [place], Charity fundraiser. Clinic advised of pt's iv abx need and clinic confirms medication is available to provide to pt at d/c. Clinicals including pharmacy note from today faxed to clinic for review. Advised clinic pt's d/c date is unknown at this time. Will contact clinic to make them aware of pt's d/c date once known. Will assist as needed.   Olivia Canter Renal Navigator [phone]

## 2022-10-29 NOTE — Progress Notes (Addendum)
Pharmacy Antibiotic Note  [name] is a 37 y.o. male admitted on 10/26/2022 after being called back for admission with 4/14 blood cultures showing MSSA. Pharmacy has been consulted for Cefazolin.  ESRD patient who originally presented to the ED on 4/14 with abd pain, N/V, low grade fever. 3 of 4 blood culture at that time are showing MSSA. Noted RIJ-TDC  TDC was removed on 4/17. Repeat cultures have been neg so far. Plan for 6 wks of abx per ID. Plan for CVTS input for possible multiple vegetations.   Plan: Cefazolin 1g IV every 24 hours until 4/19 Change Cefazolin to 2g IV TTS   Weight: 90.3 kg (199 lb 1.2 oz)  Temp (24hrs), Avg:98.2 F (36.8 C), Min:97.8 F (36.6 C), Max:98.7 F (37.1 C)  Recent Labs  Lab 10/24/22 1420 10/24/22 1428 10/26/22 1120 10/27/22 0423 10/28/22 0352 10/28/22 1239  WBC  --  7.6 5.1 4.8 5.0  --   CREATININE  --  10.51* 16.64* 11.32* 13.72* 9.30*  LATICACIDVEN 1.4  --   --   --   --   --      Estimated Creatinine Clearance: 11.9 mL/min (A) (by C-G formula based on SCr of 9.3 mg/dL (H)).    Allergies  Allergen Reactions   Amlodipine Nausea And Vomiting and Other (See Comments)    Patient was taking Amlodipine and Hydralazine at the same time, so the reactions came from one of the 2: Lethargy and an all-over feeling of NOT feeling well (also)   Hydralazine Nausea And Vomiting and Other (See Comments)    Patient was taking Hydralazine AND Amlodipine at the same time, so the reactions came from one of the 2: Lethargy and an all-over feeling of NOT feeling well (also)    Antimicrobials this admission: Cefazolin 4/16 >>  Microbiology results: 4/14 BCx >> 3/4 MSSA 4/16 blood>>1/4 MSSA 4/17 blood>>ngtd  [name], PharmD, [place], AAHIVP, CPP Infectious Disease Pharmacist 10/29/2022 8:58 AM

## 2022-10-29 NOTE — Progress Notes (Signed)
[facility] Progress Note    Assessment/ Plan:    MSSA bacteremia with endocarditis: Blood Cx 4/12 +, repeat 4/14 here positive. Susceptibilities pending, felt likely will be MSSA. ID consulting, on Cefazolin with HD. TDC removed 4/17  ESRD:  Continue HD per usual TTS schedule - AVF working ok for now, felt that will likely fail soon per notes. Will need outpateint evaluation for new access after abx course finished. HD tomorrow  Hypertension/volume: BP ok, mild pulm edema in CXR, UF as tolerated.  Anemia: Hgb 12.2 - not due to ESA yet. Avoid IV Fe  Metabolic bone disease: Ca low, follow after HD. Continue VDRA.  T2DM- per primary  Outpatient Dialysis Orders:  TTS - [facility] 4hr, 450/500, EDW 89kg, 1K/2.25Ca bath, AVF + TDC (awaiting removal), heparin 500 unit bolus + 1000/hr heparin infusion - Mircera IV q 2 weeks (just given, not yet due) - Venofer  IV weekly - Calcitriol PO q HD  Anthony Sar, MD [facility]  Subjective:   Patient seen and examined this am. No acute events. S/p tee with endocarditis. No complaints   Objective:   BP (!) 148/92 (BP Location: Right Arm)   Pulse 68   Temp 97.9 F (36.6 C)   Resp 18   Wt 90.3 kg   SpO2 100%   BMI 27.00 kg/m   Intake/Output Summary (Last 24 hours) at 10/29/2022 1227 Last data filed at 10/29/2022 1040 Gross per 24 hour  Intake 924 ml  Output --  Net 924 ml   Weight change:   Physical Exam: Gen: NAD CVS: RRR Resp: normal wob Abd: nd Ext: no edema Neuro: awake, alert Dialysis access: LUE AVF +b/t  Imaging: ECHO TEE  Result Date: 10/28/2022    TRANSESOPHOGEAL ECHO REPORT   Patient Name:   [name] Date of Exam: 10/28/2022 Medical Rec #:  [id number]       Height:       72.0 in Accession #:    [id number]      Weight:       199.1 lb Date of Birth:  May 11, 1986       BSA:          2.126 m Patient Age:    36 years        BP:           185/98 mmHg Patient Gender: M                HR:           70 bpm. Exam Location:  Inpatient Procedure: Transesophageal Echo, 3D Echo, Color Doppler and Cardiac Doppler Indications:     Endocarditis  History:         Patient has prior history of Echocardiogram examinations, most                  recent 10/27/2022. Risk Factors:Hypertension, Diabetes,                  Dyslipidemia and ESRD.  Sonographer:     Irving Burton Senior RDCS Referring Phys:  [id number] Perlie Gold Diagnosing Phys: Thomasene Ripple DO PROCEDURE: After discussion of the risks and benefits of a TEE, an informed consent was obtained from the patient. The transesophogeal probe was passed without difficulty through the esophogus of the patient. Sedation performed by different physician. The patient was monitored while under deep sedation. Anesthestetic sedation was provided intravenously by Anesthesiology:  of Propofol,  of Lidocaine.  The patient developed no complications during the procedure.  IMPRESSIONS  1. Left ventricular ejection fraction, by estimation, is 55 to 60%. The left ventricle has normal function. Left ventricular diastolic function could not be evaluated.  2. Right ventricular systolic function is normal. The right ventricular size is normal.  3. No left atrial/left atrial appendage thrombus was detected.  4. A small pericardial effusion is present. The pericardial effusion is localized near the right atrium and anterior to the right ventricle.  5. Small mobile mass noted on the anterior mitral leaflet ( best seen frame 77) suspected to be a vegetation in the setting of bactermia. Mild mitral valve regurgitation. No evidence of mitral stenosis.  6. Mobile mass on the tricuspid valve - suspect this may be on the chordae not clearly seen on the valve leaflet, suspected to be a vegetation in the setting of bactermia.  7. Very small oscillating mass on the left coronary cusp of the aortic valve suspected to be a vegetation in the setting of bactermia. Aortic valve  regurgitation is trivial. No aortic stenosis is present.  8. Mild pulmonic stenosis. Conclusion(s)/Recommendation(s): Findings are concerning for vegetation/infective endocarditis as detailed above. FINDINGS  Left Ventricle: Left ventricular ejection fraction, by estimation, is 55 to 60%. The left ventricle has normal function. The left ventricular internal cavity size was normal in size. Left ventricular diastolic function could not be evaluated. Right Ventricle: The right ventricular size is normal. No increase in right ventricular wall thickness. Right ventricular systolic function is normal. Left Atrium: Left atrial size was not well visualized. No left atrial/left atrial appendage thrombus was detected. Right Atrium: Right atrial size was not well visualized. Pericardium: A small pericardial effusion is present. The pericardial effusion is localized near the right atrium and anterior to the right ventricle. Mitral Valve: Small mobile mass noted on the anterior mitral leaflet ( best seen frame 77) suspected to be a vegetation in the setting of bactermia. Mild mitral valve regurgitation. No evidence of mitral valve stenosis. Tricuspid Valve: Mobile mass on the tricuspid valve - suspect this may be on the chordae not clearly seen on the valve leaflet, suspected to be a vegetation in the setting of bactermia. Aortic Valve: Very small oscillating mass on the left coronary cusp of the aortic valve suspected to be a vegetation in the setting of bactermia. Aortic valve regurgitation is trivial. No aortic stenosis is present. Pulmonic Valve: The pulmonic valve was normal in structure. Pulmonic valve regurgitation is not visualized. Mild pulmonic stenosis. Aorta: The aortic root and ascending aorta are structurally normal, with no evidence of dilitation. Pulmonary Artery: The pulmonary artery is of normal size. Venous: The left upper pulmonary vein, left lower pulmonary vein, right upper pulmonary vein and right lower  pulmonary vein are normal. A normal flow pattern is recorded from the right upper pulmonary vein. IAS/Shunts: No atrial level shunt detected by color flow Doppler. Additional Comments: Spectral Doppler performed. Thomasene Ripple DO Electronically signed by Thomasene Ripple DO Signature Date/Time: 10/28/2022/1:53:25 PM    Final    EP STUDY  Result Date: 10/28/2022 See surgical note for result.   Labs: BMET Recent Labs  Lab 10/24/22 1428 10/26/22 1120 10/27/22 0423 10/28/22 0352 10/28/22 1239  NA 132* 131* 134* 133* 135  K 3.6 3.7 3.8 3.5 3.5  CL 92* 92* 92* 88* 94*  CO2 --   GLUCOSE 122* 106* 120* 136* 125*  BUN 31* 57* 33* 50* 30*  CREATININE 10.51*  16.64* 11.32* 13.72* 9.30*  CALCIUM 8.0* 6.9* 7.7* 7.4*  --   PHOS  --   --  6.1* 6.1*  --    CBC Recent Labs  Lab 10/24/22 1428 10/26/22 1120 10/27/22 0423 10/28/22 0352 10/28/22 1239  WBC 7.6 5.1 4.8 5.0  --   NEUTROABS 6.9 3.1  --   --   --   HGB 10.5* 9.1* 9.4* 9.1* 12.2*  HCT 34.0* 28.3* 29.4* 30.3* 36.0*  MCV 81.5 78.8* 79.5* 80.8  --   PLT 230 158 162 205  --     Medications:     (feeding supplement) PROSource Plus  30 mL Oral BID BM   atorvastatin  40 mg Oral Daily   calcitRIOL  1 mcg Oral Once per day on Tue Thu Sat   carvedilol  25 mg Oral BID WC   Chlorhexidine Gluconate Cloth  6 each Topical Q0600   cloNIDine  0.1 mg Oral BID   heparin  5,000 Units Subcutaneous Q8H   insulin aspart  0-5 Units Subcutaneous QHS   insulin aspart  0-6 Units Subcutaneous TID WC   sodium chloride flush  3 mL Intravenous Q12H      Anthony Sar, MD [facility] 10/29/2022, 12:27 PM

## 2022-10-30 DIAGNOSIS — R7881 Bacteremia: Secondary | ICD-10-CM | POA: Diagnosis not present

## 2022-10-30 DIAGNOSIS — B9561 Methicillin susceptible Staphylococcus aureus infection as the cause of diseases classified elsewhere: Secondary | ICD-10-CM | POA: Diagnosis not present

## 2022-10-30 LAB — RENAL FUNCTION PANEL
Albumin: 3.2 g/dL — ABNORMAL LOW (ref 3.5–5.0)
Anion gap: 15 (ref 5–15)
BUN: 73 mg/dL — ABNORMAL HIGH (ref 6–20)
CO2: 24 mmol/L (ref 22–32)
Calcium: 7.7 mg/dL — ABNORMAL LOW (ref 8.9–10.3)
Chloride: 91 mmol/L — ABNORMAL LOW (ref 98–111)
Creatinine, Ser: 13.35 mg/dL — ABNORMAL HIGH (ref 0.61–1.24)
GFR, Estimated: 4 mL/min — ABNORMAL LOW (ref >60)
Glucose, Bld: 157 mg/dL — ABNORMAL HIGH (ref 70–99)
Phosphorus: 5.8 mg/dL — ABNORMAL HIGH (ref 2.5–4.6)
Potassium: 4 mmol/L (ref 3.5–5.1)
Sodium: 130 mmol/L — ABNORMAL LOW (ref 135–145)

## 2022-10-30 LAB — GLUCOSE, CAPILLARY
Glucose-Capillary: 172 mg/dL — ABNORMAL HIGH (ref 70–99)
Glucose-Capillary: 119 mg/dL — ABNORMAL HIGH (ref 70–99)

## 2022-10-30 LAB — CBC
HCT: 32.3 % — ABNORMAL LOW (ref 39.0–52.0)
Hemoglobin: 9.9 g/dL — ABNORMAL LOW (ref 13.0–17.0)
MCH: 24.1 pg — ABNORMAL LOW (ref 26.0–34.0)
MCHC: 30.7 g/dL (ref 30.0–36.0)
MCV: 78.8 fL — ABNORMAL LOW (ref 80.0–100.0)
Platelets: 322 10*3/uL (ref 150–400)
RBC: 4.1 MIL/uL — ABNORMAL LOW (ref 4.22–5.81)
RDW: 19.8 % — ABNORMAL HIGH (ref 11.5–15.5)
WBC: 6 10*3/uL (ref 4.0–10.5)
nRBC: 0 % (ref 0.0–0.2)

## 2022-10-30 MED ORDER — CEFAZOLIN SODIUM-DEXTROSE 2-4 GM/100ML-% IV SOLN: 2.0000 g | INTRAVENOUS | Status: AC

## 2022-10-30 MED ORDER — HEPARIN SODIUM (PORCINE) 1000 UNIT/ML DIALYSIS
2000.0000 [IU] | INTRAMUSCULAR | Status: DC | PRN
  Administered 2022-10-30: 2000 [IU] via INTRAVENOUS_CENTRAL

## 2022-10-30 NOTE — Discharge Summary (Signed)
Physician Discharge Summary   Patient: [name] MRN: [id number] DOB: [birth year]  Admit date:     10/26/2022  Discharge date: 10/30/22  Discharge Physician: Lynden Oxford  PCP: Rema Fendt, NP  Recommendations at discharge: Follow-up with ID as recommended 6 weeks of cefazolin via HD from 4/17. EOT 12/07/22.    Follow-up Information     Rema Fendt, NP. Schedule an appointment as soon as possible for a visit in 1 week(s).   Specialty: Nurse Practitioner Contact information: [address] [place] [address] [phone]                Discharge Diagnoses: Principal Problem:   MSSA bacteremia Active Problems:   Anemia   Essential hypertension   Type 2 diabetes mellitus with ESRD (end-stage renal disease)   ESRD (end stage renal disease) on dialysis   Hemodialysis catheter infection  Hospital Course: [name] is a 37 y.o. male with medical history significant of diastolic CHF, ESRD on HD, anemia, hypertension, diabetes presenting with positive blood cultures   Patient was seen in the ED on 4/14 presenting with fever, nausea, vomiting, diarrhea for couple days.  Also feeling generally unwell at that time.  Symptoms have been ongoing since around 4/12.  He felt better in the ED and workup was overall reassuring and patient was discharged.  Blood cultures obtained to the visit have come back positive today for MSSA in both bottles.  Patient advised to come back to the ED for admission for IV antibiotics and further workup. TDC removed on 4/17.  Cardiology consulted for TEE.  ID following.  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 Assessment and Plan  MSSA bacteremia. 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 Repeat cultures performed on 4/17 so far negative. Infectious disease following. Currently on  IV cefazolin. Echocardiogram 55 to 60% EF.  5x3 mm oscillating mass on the tricuspid leaflet concerning for vegetation. TDC was removed on 4/17. TTE showed tricuspid vegetation.  TEE confirmed the tricuspid vegetation as well as found to have vegetations in mitral and aortic area as well. At the request of ID, CT surgery was consulted, recommend observation and IV antibiotics. Blood cultures so far negative for 3 days.  ID recommending 6 weeks of antibiotics with last day 12/07/2022.   ESRD on HD. TTS schedule. AVF working. Will require outpatient evaluation for a new access. Per nephrology outpatient antibiotics arranged.   HTN. Blood pressure stable.  On Coreg 25 mg twice daily, clonidine  0.1 Mg twice daily Monitor.   Mild pulmonary edema. Resolved with HD. Monitor.   Anemia of CKD. Hemoglobin stable. Monitor.   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 Will resume home regimen.   Consultants:  ID IR Nephrology Cardiology Cardiothoracic surgery  Procedures performed:  Echocardiogram TEE Hemodialysis HD catheter removal  DISCHARGE MEDICATION: Allergies as of 10/30/2022       Reactions   Amlodipine Nausea And Vomiting, Other (See Comments)   Patient was taking Amlodipine and Hydralazine at the same time, so the reactions came from one of the 2: Lethargy and an all-over feeling of NOT feeling well (also)   Hydralazine Nausea And Vomiting, Other (See Comments)   Patient was taking Hydralazine AND Amlodipine at the same time, so the reactions came from one of the 2: Lethargy and an all-over feeling of NOT feeling  well (also)        Medication List     TAKE these medications    acetaminophen 325 MG tablet Commonly known as: TYLENOL Take 2 tablets (650 mg total) by mouth every 6 (six) hours as needed for mild pain (or Fever >/= 101).   atorvastatin 40 MG tablet Commonly known as: LIPITOR Take 40 mg by mouth daily.    calcitRIOL 0.5 MCG capsule Commonly known as: ROCALTROL Take 2 capsules (1 mcg total) by mouth Every Tuesday,Thursday,and Saturday with dialysis.   carvedilol 25 MG tablet Commonly known as: COREG Take 1 tablet (25 mg total) by mouth 2 (two) times daily with a meal.   ceFAZolin 2-4 GM/100ML-% IVPB Commonly known as: ANCEF Inject 100 mLs (2 g total) into the vein Every Tuesday,Thursday,and Saturday with dialysis.   cloNIDine 0.1 MG tablet Commonly known as: CATAPRES Take 0.1 mg by mouth 2 (two) times daily.   insulin glargine-yfgn 100 UNIT/ML injection Commonly known as: SEMGLEE Inject 0.12 mLs (12 Units total) into the skin at bedtime.   insulin lispro 100 UNIT/ML injection Commonly known as: HUMALOG Inject 1-7 Units into the skin See admin instructions. Per sliding scale 3 times daily with meals 151-200= 1 unit 201-250= 2 units 251-300= 3 units 301-350= 5 units 351-400= 7 units Greater than 400 call md takes only as needed if blood sugar is over 200   multivitamin Tabs tablet Take 1 tablet by mouth at bedtime.   Rena-Vite Rx 1 MG Tabs Take 1 tablet by mouth daily.   multivitamin with minerals tablet Take 1 tablet by mouth daily.   ondansetron 4 MG tablet Commonly known as: ZOFRAN Take 4 mg by mouth every 8 (eight) hours as needed for nausea or vomiting.   Pen Needles 31G X 8 MM Misc UAD   prednisoLONE acetate 1 % ophthalmic suspension Commonly known as: PRED FORTE Place 1 drop into the left eye See admin instructions. 2-3 times daily   sevelamer carbonate 800 MG tablet Commonly known as: RENVELA Take 2 tablets (1,600 mg total) by mouth 3 (three) times daily with meals.       Disposition: Home Diet recommendation: Renal diet  Discharge Exam: Vitals:   10/30/22 1630 10/30/22 1700 10/30/22 1730 10/30/22 1800  BP: (!) 155/99 (!) 146/101 137/88 130/79  Pulse: 63 69 65 66  Resp: 12 19 20  (!) 0  Temp:      TempSrc:      SpO2: 100% 100% 99% 100%   Weight:       General: Appear in no distress; no visible Abnormal Neck Mass Or lumps, Conjunctiva normal Cardiovascular: S1 and S2 Present, no Murmur, Respiratory: good respiratory effort, Bilateral Air entry present and CTA, no Crackles, no wheezes Abdomen: Bowel Sound present, Non tender  Extremities: no Pedal edema Neurology: alert and oriented to time, place, and person  [facility] Weights   10/28/22 0733 10/30/22 1426 10/30/22 1437  Weight: 90.3 kg 89.8 kg 89.8 kg   Condition at discharge: stable  The results of significant diagnostics from this hospitalization (including imaging, microbiology, ancillary and laboratory) are listed below for reference.   Imaging Studies: ECHO TEE  Result Date: 10/28/2022    TRANSESOPHOGEAL ECHO REPORT   Patient Name:   [name] Pichette Date of Exam: 10/28/2022 Medical Rec #:  [id number]       Height:       72.0 in Accession #:    [id number]      Weight:  199.1 lb Date of Birth:  Apr 28, 1986       BSA:          2.126 m Patient Age:    36 years        BP:           185/98 mmHg Patient Gender: M               HR:           70 bpm. Exam Location:  Inpatient Procedure: Transesophageal Echo, 3D Echo, Color Doppler and Cardiac Doppler Indications:     Endocarditis  History:         Patient has prior history of Echocardiogram examinations, most                  recent 10/27/2022. Risk Factors:Hypertension, Diabetes,                  Dyslipidemia and ESRD.  Sonographer:     Irving Burton Senior RDCS Referring Phys:  [id number] Perlie Gold Diagnosing Phys: Thomasene Ripple DO PROCEDURE: After discussion of the risks and benefits of a TEE, an informed consent was obtained from the patient. The transesophogeal probe was passed without difficulty through the esophogus of the patient. Sedation performed by different physician. The patient was monitored while under deep sedation. Anesthestetic sedation was provided intravenously by Anesthesiology: 380mg  of Propofol, 100mg  of Lidocaine. The  patient developed no complications during the procedure.  IMPRESSIONS  1. Left ventricular ejection fraction, by estimation, is 55 to 60%. The left ventricle has normal function. Left ventricular diastolic function could not be evaluated.  2. Right ventricular systolic function is normal. The right ventricular size is normal.  3. No left atrial/left atrial appendage thrombus was detected.  4. A small pericardial effusion is present. The pericardial effusion is localized near the right atrium and anterior to the right ventricle.  5. Small mobile mass noted on the anterior mitral leaflet ( best seen frame 77) suspected to be a vegetation in the setting of bactermia. Mild mitral valve regurgitation. No evidence of mitral stenosis.  6. Mobile mass on the tricuspid valve - suspect this may be on the chordae not clearly seen on the valve leaflet, suspected to be a vegetation in the setting of bactermia.  7. Very small oscillating mass on the left coronary cusp of the aortic valve suspected to be a vegetation in the setting of bactermia. Aortic valve regurgitation is trivial. No aortic stenosis is present.  8. Mild pulmonic stenosis. Conclusion(s)/Recommendation(s): Findings are concerning for vegetation/infective endocarditis as detailed above. FINDINGS  Left Ventricle: Left ventricular ejection fraction, by estimation, is 55 to 60%. The left ventricle has normal function. The left ventricular internal cavity size was normal in size. Left ventricular diastolic function could not be evaluated. Right Ventricle: The right ventricular size is normal. No increase in right ventricular wall thickness. Right ventricular systolic function is normal. Left Atrium: Left atrial size was not well visualized. No left atrial/left atrial appendage thrombus was detected. Right Atrium: Right atrial size was not well visualized. Pericardium: A small pericardial effusion is present. The pericardial effusion is localized near the right atrium  and anterior to the right ventricle. Mitral Valve: Small mobile mass noted on the anterior mitral leaflet ( best seen frame 77) suspected to be a vegetation in the setting of bactermia. Mild mitral valve regurgitation. No evidence of mitral valve stenosis. Tricuspid Valve: Mobile mass on the tricuspid valve - suspect this may be on  the chordae not clearly seen on the valve leaflet, suspected to be a vegetation in the setting of bactermia. Aortic Valve: Very small oscillating mass on the left coronary cusp of the aortic valve suspected to be a vegetation in the setting of bactermia. Aortic valve regurgitation is trivial. No aortic stenosis is present. Pulmonic Valve: The pulmonic valve was normal in structure. Pulmonic valve regurgitation is not visualized. Mild pulmonic stenosis. Aorta: The aortic root and ascending aorta are structurally normal, with no evidence of dilitation. Pulmonary Artery: The pulmonary artery is of normal size. Venous: The left upper pulmonary vein, left lower pulmonary vein, right upper pulmonary vein and right lower pulmonary vein are normal. A normal flow pattern is recorded from the right upper pulmonary vein. IAS/Shunts: No atrial level shunt detected by color flow Doppler. Additional Comments: Spectral Doppler performed. Thomasene Ripple DO Electronically signed by Thomasene Ripple DO Signature Date/Time: 10/28/2022/1:53:25 PM    Final    EP STUDY  Result Date: 10/28/2022 See surgical note for result.  ECHOCARDIOGRAM COMPLETE  Result Date: 10/27/2022    ECHOCARDIOGRAM REPORT   Patient Name:   [name] Fleagle Date of Exam: 10/27/2022 Medical Rec #:  [id number]       Height:       72.0 in Accession #:    [id number]      Weight:       201.6 lb Date of Birth:  06/27/1986       BSA:          2.138 m Patient Age:    36 years        BP:           136/78 mmHg Patient Gender: M               HR:           79 bpm. Exam Location:  Inpatient Procedure: 2D Echo, 3D Echo, Strain Analysis, Color Doppler  and Cardiac Doppler Indications:    Bacteremia  History:        Patient has prior history of Echocardiogram examinations, most                 recent 02/09/2021. Hypertrophic Cardiomyopathy and CHF,                 Signs/Symptoms:Shortness of Breath and Syncope; Risk                 Factors:Hypertension and Diabetes.  Sonographer:    Milbert Coulter Referring Phys: [id number] [facility]  Sonographer Comments: Global longitudinal strain was attempted. IMPRESSIONS  1. Left ventricular ejection fraction, by estimation, is 55 to 60%. The left ventricle has normal function. The left ventricle has no regional wall motion abnormalities. There is moderate concentric left ventricular hypertrophy. Left ventricular diastolic parameters are consistent with Grade III diastolic dysfunction (restrictive). Elevated left atrial pressure. The average left ventricular global longitudinal strain is -11.2 %. The global longitudinal strain is abnormal.  2. Right ventricular systolic function is normal. The right ventricular size is mildly enlarged. There is severely elevated pulmonary artery systolic pressure. The estimated right ventricular systolic pressure is 65.1 mmHg.  3. The mitral valve is normal in structure. Trivial mitral valve regurgitation. No evidence of mitral stenosis.  4. There is a small (5x3 mm) oscillating mass on the ventricular surface of the anterior tricuspid leaflet. It most likely represents redundant chordal or valve tissue, but cannot completely exclude a vegetation. Tricuspid valve regurgitation is mild to  moderate.  5. The aortic valve is normal in structure. Aortic valve regurgitation is not visualized. No aortic stenosis is present.  6. The inferior vena cava is dilated in size with <50% respiratory variability, suggesting right atrial pressure of 15 mmHg. Comparison(s): No significant change from prior study. Prior images reviewed side by side. Conclusion(s)/Recommendation(s): There is a small oscillating  echodensity on the anterior tricuspid leaflet. This is favored to represent redundant valve tissue, rather than a vegetation. It was similar on the 2022 study. There are no other findings that  suggest endocarditis. FINDINGS  Left Ventricle: Left ventricular ejection fraction, by estimation, is 55 to 60%. The left ventricle has normal function. The left ventricle has no regional wall motion abnormalities. The average left ventricular global longitudinal strain is -11.2 %. The global longitudinal strain is abnormal. The left ventricular internal cavity size was normal in size. There is moderate concentric left ventricular hypertrophy. Left ventricular diastolic parameters are consistent with Grade III diastolic dysfunction  (restrictive). Elevated left atrial pressure. Right Ventricle: The right ventricular size is mildly enlarged. No increase in right ventricular wall thickness. Right ventricular systolic function is normal. There is severely elevated pulmonary artery systolic pressure. The tricuspid regurgitant velocity is 3.54 m/s, and with an assumed right atrial pressure of 15 mmHg, the estimated right ventricular systolic pressure is 65.1 mmHg. Left Atrium: Left atrial size was normal in size. Right Atrium: Right atrial size was normal in size. Pericardium: There is no evidence of pericardial effusion. Mitral Valve: The mitral valve is normal in structure. Trivial mitral valve regurgitation. No evidence of mitral valve stenosis. Tricuspid Valve: There is a small (5x3 mm) oscillating mass on the ventricular surface of the anterior tricuspid leaflet. It most likely represents redundant chordal or valve tissue, but cannot completely exclude a vegetation. The tricuspid valve is normal in structure. Tricuspid valve regurgitation is mild to moderate. No evidence of tricuspid stenosis. Aortic Valve: The aortic valve is normal in structure. Aortic valve regurgitation is not visualized. No aortic stenosis is present.  Aortic valve mean gradient measures 5.0 mmHg. Aortic valve peak gradient measures 8.8 mmHg. Pulmonic Valve: The pulmonic valve was normal in structure. Pulmonic valve regurgitation is trivial. No evidence of pulmonic stenosis. Aorta: The aortic root is normal in size and structure. Venous: The inferior vena cava is dilated in size with less than 50% respiratory variability, suggesting right atrial pressure of 15 mmHg. IAS/Shunts: No atrial level shunt detected by color flow Doppler.  LEFT VENTRICLE PLAX 2D LVIDd:         5.60 cm      Diastology LVIDs:         4.00 cm      LV e' medial:    6.31 cm/s LV PW:         1.60 cm      LV E/e' medial:  23.8 LV IVS:        1.50 cm      LV e' lateral:   7.18 cm/s                             LV E/e' lateral: 20.9  LV Volumes (MOD)            2D Longitudinal Strain LV vol d, MOD A2C: 137.0 ml 2D Strain GLS Avg:     -11.2 % LV vol d, MOD A4C: 144.0 ml LV vol s, MOD A2C: 70.4 ml LV vol s,  MOD A4C: 47.0 ml LV SV MOD A2C:     66.6 ml LV SV MOD A4C:     144.0 ml LV SV MOD BP:      85.4 ml RIGHT VENTRICLE RV Basal diam:  4.70 cm RV Mid diam:    4.20 cm RV S prime:     13.70 cm/s TAPSE (M-mode): 1.7 cm LEFT ATRIUM             Index        RIGHT ATRIUM           Index LA Vol (A2C):   63.0 ml 29.47 ml/m  RA Area:     19.70 cm LA Vol (A4C):   82.7 ml 38.68 ml/m  RA Volume:   54.50 ml  25.49 ml/m LA Biplane Vol: 75.7 ml 35.41 ml/m  AORTIC VALVE AV Vmax:           148.00 cm/s AV Vmean:          103.000 cm/s AV VTI:            0.280 m AV Peak Grad:      8.8 mmHg AV Mean Grad:      5.0 mmHg LVOT Vmax:         119.00 cm/s LVOT Vmean:        75.500 cm/s LVOT VTI:          0.217 m LVOT/AV VTI ratio: 0.77 MITRAL VALVE                TRICUSPID VALVE MV Area (PHT): 5.06 cm     TR Peak grad:   50.1 mmHg MV Decel Time: 150 msec     TR Vmax:        354.00 cm/s MV E velocity: 150.00 cm/s MV A velocity: 68.10 cm/s   SHUNTS MV E/A ratio:  2.20         Systemic VTI: 0.22 m Rachelle Hora Croitoru MD  Electronically signed by Thurmon Fair MD Signature Date/Time: 10/27/2022/11:32:30 AM    Final    IR Removal Tun Cv Cath W/O FL  Result Date: 10/27/2022 INDICATION: Bacteremia. Request removal of tunneled hemodialysis catheter placed on 10/06/2022. Patient also has functional left upper extremity AV fistula. EXAM: REMOVAL OF TUNNELED RIGHT IJ HEMODIALYSIS CATHETER MEDICATIONS: None COMPLICATIONS: None immediate. PROCEDURE: Informed written consent was obtained from the patient following an explanation of the procedure, risks, benefits and alternatives to treatment. A time out was performed prior to the initiation of the procedure. Maximal barrier sterile technique was utilized including caps, mask, sterile gowns, sterile gloves, large sterile drape, hand hygiene, and chlorhexidine. Utilizing a combination of blunt dissection and gentle traction, the cuff of the catheter was exposed and the catheter was removed intact. Hemostasis was obtained with manual compression. A dressing was placed. The patient tolerated the procedure well without immediate post procedural complication. IMPRESSION: Successful removal of tunneled right IJ hemodialysis catheter. Read by: Brayton El PA-C Electronically Signed   By: Simonne Come M.D.   On: 10/27/2022 09:54   DG Chest 2 View  Result Date: 10/26/2022 CLINICAL DATA:  Shortness of breath EXAM: CHEST - 2 VIEW COMPARISON:  Chest x-ray 10/24/2022 and older FINDINGS: Stable right IJ large-bore catheter with tip at the SVC right atrial junction region. Enlarged cardiopericardial silhouette with some vascular congestion and trace edema, increased from previous. No pneumothorax, effusion or consolidation. Overlapping cardiac leads. IMPRESSION: Developing vascular congestion and trace edema.  Right IJ line. Electronically Signed  By: Karen Kays M.D.   On: 10/26/2022 12:09   US Abdomen Limited RUQ (LIVER/GB)  Result Date: 10/24/2022 CLINICAL DATA:  Epigastric pain. EXAM:  ULTRASOUND ABDOMEN LIMITED RIGHT UPPER QUADRANT COMPARISON:  CT abdomen pelvis dated March 11, 2021. FINDINGS: Gallbladder: No gallstones or wall thickening visualized. No sonographic Murphy sign noted by sonographer. Common bile duct: Diameter: 4 mm, normal. Liver: No focal lesion identified. Increased parenchymal echogenicity. Portal vein is patent on color Doppler imaging with normal direction of blood flow towards the liver. Other: None. IMPRESSION: 1. No acute abnormality. 2. Hepatic steatosis. Electronically Signed   By: Obie Dredge M.D.   On: 10/24/2022 17:28   DG Chest 2 View  Result Date: 10/24/2022 CLINICAL DATA:  Fever, vomiting and flu-like symptoms since Friday EXAM: CHEST - 2 VIEW COMPARISON:  Chest x-ray dated 10/04/2022. FINDINGS: Stable cardiomegaly. Lungs are clear. No pleural effusion or pneumothorax is seen. Osseous structures about the chest are unremarkable. IMPRESSION: No active cardiopulmonary disease. No evidence of pneumonia or pulmonary edema. Stable cardiomegaly. Electronically Signed   By: Bary Richard M.D.   On: 10/24/2022 15:14   IR Fluoro Guide CV Line Right  Result Date: 10/06/2022 INDICATION: Clotted left forearm AV fistula.  Please attempted declot procedure. EXAM: 1. FISTULALYSIS 2. ANGIOPLASTY OF VENOUS LIMB AND VENOUS ANASTOMOSIS 3. ULTRASOUND GUIDANCE FOR VENOUS ACCESS 4. ULTRASOUND AND FLUOROSCOPIC GUIDED PLACEMENT OF RIGHT INTERNAL JUGULAR APPROACH HEMODIALYSIS CATHETER. COMPARISON:  Image guided placement of temporary hemodialysis catheter-10/04/2022 MEDICATIONS: Heparin 4000 units IV; TPA 4 mg into the AV fistula. CONTRAST:  40 cc Omnipaque 300 ANESTHESIA/SEDATION: Moderate (conscious) sedation was employed during this procedure. A total of Versed 4 mg and Fentanyl 200 mcg was administered intravenously. Moderate Sedation Time: 107 minutes. The patient's level of consciousness and vital signs were monitored continuously by radiology nursing throughout the  procedure under my direct supervision. FLUOROSCOPY TIME:  19 minutes (35 mGy) COMPLICATIONS: None immediate. TECHNIQUE: Informed written consent was obtained from the patient after a discussion of the risks, benefits and alternatives to treatment. Questions regarding the procedure were encouraged and answered. A timeout was performed prior to the initiation of the procedure. 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 The left forearm was prepped and draped in usual sterile fashion. Maximum barrier sterile technique with sterile gowns and gloves were used for the procedure. Under ultrasound guidance, the AV fistula was accessed at the arterial limb directed towards the venous outflow allowing for placement of a 7 French vascular sheath. Despite the use of a Kumpe catheter, there is great difficulty advancing the glidewire beyond the peripheral most aneurism of the AV fistula As such, the occluded draining venous limb was accessed directed towards the anastomosis under direct ultrasound guidance allowing for placement of a micropuncture sheath. Next, stiff glidewire was advanced to the level of the initial 7 [place] vascular sheath access. An ensnare was utilized to grasp the stiff glidewire which was then pulled through the aneurysm to the level of the venous outflow. Limited contrast injection confirmed appropriate positioning. A left upper extremity central venogram was performed. Next, the occluded segment of the AV fistula was laced with 4 mg of tPA via Kumpe catheter. Next, thrombosed segment of the AV fistula was angioplastied at multiple stations with a 6 mm x 4 cm  Conquest balloon. Mechanical thrombectomy was then performed with the use of both on Angiojet device as well as a Fogarty occlusion balloon. Initial images  demonstrate restored patency of the AV fistula with residual near occlusive mural thrombus. As such, the previously thrombosed segment of the AV fistula were angioplastied with a 7 mm x 4 cm Conquest balloon and several additional rounds of mechanical thrombectomy was performed both with the Angiojet device as well as the Fogarty occlusion balloon. Completion images were obtained and while there was restored flow to the AV fistula, there was a significant amount of nonocclusive mural thrombus which appeared refractory to continued intervention. As such the decision was made to either confirmed or placed a new tunneled hemodialysis catheter to ensure durable dialysis access. At this point, the procedure was terminated. All wires, catheters and sheaths were removed from the patient. Hemostasis was achieved at the access site with deployment of a swizzle sutures which will be removed at the patient's next dialysis session. Dressings were placed. _________________________________________________________ Attention was now paid towards placement of a tunneled hemodialysis catheter. The right neck, chest and external portion of the pre-existing right internal jugular approach temporary hemodialysis catheter was prepped and draped in usual sterile fashion. As the temporary hemodialysis catheter puncture site was deemed to be too high the right internal jugular vein was accessed at a more caudal location allowing advancement of a microwire to the level of the superior cavoatrial junction. The microwire was utilized for measurement purposes and a stiff glidewire was advanced to the level of the IVC. Next, a 19 cm tip to cuff palindrome hemodialysis catheter was tunneled from a entrance site on right anterior upper chest to the venotomy site. The pre-existing temporary hemodialysis catheter was removed intact. Peel-away sheath was placed and the dialysis catheter was inserted with tip ultimately terminating within the superior  aspect of the right atrium. Catheter was noted to easily aspirate and flush. Postprocedural chest radiograph was negative for pneumothorax. Hemodialysis catheter was flushed with a appropriate heparin dwell. The patient tolerated the procedure without immediate postprocedural complication. FINDINGS: The existing left forearm radial-cephalic AV fistula is patent from the level of the anastomosis to its arterial stick zone however thrombosed centrally from that location to the distal humerus. 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 The cephalic vein restored patency at the level of the mid humerus and is patent to the level of the cephalic arch. The central venous system of the left upper extremity is widely patent to the level of the superior cavoatrial junction The AV fistula was successfully recannulated following pharmacological and mechanical thrombectomy interventions as detailed above including angioplasty of the thrombosed segments ultimately to 7 mm diameter. Completion images demonstrate restored patency to the fistula though there is a large amount of irregular nonocclusive mural thrombus which was refractory to continued intervention. As such the decision was made to place a new tunneled hemodialysis catheter to ensure durable intravenous access. Note, the recently placed temporary dialysis catheter was deemed too high and access site to allow for conversion. After successful ultrasound and fluoroscopic guided placement, a 19 cm tip to cuff tunneled hemodialysis catheter is appropriately positioned with end terminating within the superior aspect of the right atrium. IMPRESSION: 1. Technically successful left forearm radial-cephalic thrombolysis though with a moderate-to-large amount of residual nonocclusive thrombus which was refractory to continued percutaneous  intervention. Given  my concern for the lack of durability of this attempted declot, I placed a tunneled right internal jugular approach dialysis catheter to ensure durable dialysis access. 2. The draining left cephalic vein is patent from the level of the mid humerus to the level of the cephalic arch. 3. The central venous system of the right upper extremity is widely patent. ACCESS: - Recommend attempted access of the patient's left forearm AV fistula. - If three successful dialysis sessions are achieved via the fistula, the tunneled hemodialysis catheter may be removed as indicated. Electronically Signed   By: Simonne Come M.D.   On: 10/06/2022 18:31   IR US Guide Vasc Access Right  Result Date: 10/06/2022 INDICATION: Clotted left forearm AV fistula.  Please attempted declot procedure. EXAM: 1. FISTULALYSIS 2. ANGIOPLASTY OF VENOUS LIMB AND VENOUS ANASTOMOSIS 3. ULTRASOUND GUIDANCE FOR VENOUS ACCESS 4. ULTRASOUND AND FLUOROSCOPIC GUIDED PLACEMENT OF RIGHT INTERNAL JUGULAR APPROACH HEMODIALYSIS CATHETER. COMPARISON:  Image guided placement of temporary hemodialysis catheter-10/04/2022 MEDICATIONS: Heparin 4000 units IV; TPA 4 mg into the AV fistula. CONTRAST:  40 cc Omnipaque 300 ANESTHESIA/SEDATION: Moderate (conscious) sedation was employed during this procedure. A total of Versed 4 mg and Fentanyl 200 mcg was administered intravenously. Moderate Sedation Time: 107 minutes. The patient's level of consciousness and vital signs were monitored continuously by radiology nursing throughout the procedure under my direct supervision. FLUOROSCOPY TIME:  19 minutes (35 mGy) COMPLICATIONS: None immediate. TECHNIQUE: Informed written consent was obtained from the patient after a discussion of the risks, benefits and alternatives to treatment. Questions regarding the procedure were encouraged and answered. A timeout was performed prior to the initiation of the procedure. 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 The left forearm was prepped and draped in usual sterile fashion. Maximum barrier sterile technique with sterile gowns and gloves were used for the procedure. Under ultrasound guidance, the AV fistula was accessed at the arterial limb directed towards the venous outflow allowing for placement of a 7 French vascular sheath. Despite the use of a Kumpe catheter, there is great difficulty advancing the glidewire beyond the peripheral most aneurism of the AV fistula As such, the occluded draining venous limb was accessed directed towards the anastomosis under direct ultrasound guidance allowing for placement of a micropuncture sheath. Next, stiff glidewire was advanced to the level of the initial 7 [place] vascular sheath access. An ensnare was utilized to grasp the stiff glidewire which was then pulled through the aneurysm to the level of the venous outflow. Limited contrast injection confirmed appropriate positioning. A left upper extremity central venogram was performed. Next, the occluded segment of the AV fistula was laced with 4 mg of tPA via Kumpe catheter. Next, thrombosed segment of the AV fistula was angioplastied at multiple stations with a 6 mm x 4 cm Conquest balloon. Mechanical thrombectomy was then performed with the use of both on Angiojet device as well as a Fogarty occlusion balloon. Initial images demonstrate restored patency of the AV fistula with residual near occlusive mural thrombus. As such, the previously thrombosed segment of the AV fistula were angioplastied with a 7 mm x 4 cm Conquest balloon and several additional rounds of mechanical thrombectomy was performed both with the Angiojet device as well as the Fogarty occlusion balloon. Completion images were obtained and while there  was restored flow to the AV  fistula, there was a significant amount of nonocclusive mural thrombus which appeared refractory to continued intervention. As such the decision was made to either confirmed or placed a new tunneled hemodialysis catheter to ensure durable dialysis access. At this point, the procedure was terminated. All wires, catheters and sheaths were removed from the patient. Hemostasis was achieved at the access site with deployment of a swizzle sutures which will be removed at the patient's next dialysis session. Dressings were placed. _________________________________________________________ Attention was now paid towards placement of a tunneled hemodialysis catheter. The right neck, chest and external portion of the pre-existing right internal jugular approach temporary hemodialysis catheter was prepped and draped in usual sterile fashion. As the temporary hemodialysis catheter puncture site was deemed to be too high the right internal jugular vein was accessed at a more caudal location allowing advancement of a microwire to the level of the superior cavoatrial junction. The microwire was utilized for measurement purposes and a stiff glidewire was advanced to the level of the IVC. Next, a 19 cm tip to cuff palindrome hemodialysis catheter was tunneled from a entrance site on right anterior upper chest to the venotomy site. The pre-existing temporary hemodialysis catheter was removed intact. Peel-away sheath was placed and the dialysis catheter was inserted with tip ultimately terminating within the superior aspect of the right atrium. Catheter was noted to easily aspirate and flush. Postprocedural chest radiograph was negative for pneumothorax. Hemodialysis catheter was flushed with a appropriate heparin dwell. The patient tolerated the procedure without immediate postprocedural complication. FINDINGS: The existing left forearm radial-cephalic AV fistula is patent from the level of the anastomosis to its arterial stick zone  however thrombosed centrally from that location to the distal humerus. 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 The cephalic vein restored patency at the level of the mid humerus and is patent to the level of the cephalic arch. The central venous system of the left upper extremity is widely patent to the level of the superior cavoatrial junction The AV fistula was successfully recannulated following pharmacological and mechanical thrombectomy interventions as detailed above including angioplasty of the thrombosed segments ultimately to 7 mm diameter. Completion images demonstrate restored patency to the fistula though there is a large amount of irregular nonocclusive mural thrombus which was refractory to continued intervention. As such the decision was made to place a new tunneled hemodialysis catheter to ensure durable intravenous access. Note, the recently placed temporary dialysis catheter was deemed too high and access site to allow for conversion. After successful ultrasound and fluoroscopic guided placement, a 19 cm tip to cuff tunneled hemodialysis catheter is appropriately positioned with end terminating within the superior aspect of the right atrium. IMPRESSION: 1. Technically successful left forearm radial-cephalic thrombolysis though with a moderate-to-large amount of residual nonocclusive thrombus which was refractory to continued percutaneous intervention. Given my concern for the lack of durability of this attempted declot, I placed a tunneled right internal jugular approach dialysis catheter to ensure durable dialysis access. 2. The draining left cephalic vein is patent from the level of the mid humerus to the level of the cephalic arch. 3. The central venous system of the right upper extremity is widely patent. ACCESS: - Recommend attempted access of the  patient's left forearm AV fistula. - If three successful dialysis sessions are achieved via the fistula, the tunneled  hemodialysis catheter may be removed as indicated. Electronically Signed   By: Simonne Come M.D.   On: 10/06/2022 18:31   IR THROMBECTOMY AV FISTULA W/THROMBOLYSIS/PTA INC/SHUNT/IMG LEFT  Result Date: 10/06/2022 INDICATION: Clotted left forearm AV fistula.  Please attempted declot procedure. EXAM: 1. FISTULALYSIS 2. ANGIOPLASTY OF VENOUS LIMB AND VENOUS ANASTOMOSIS 3. ULTRASOUND GUIDANCE FOR VENOUS ACCESS 4. ULTRASOUND AND FLUOROSCOPIC GUIDED PLACEMENT OF RIGHT INTERNAL JUGULAR APPROACH HEMODIALYSIS CATHETER. COMPARISON:  Image guided placement of temporary hemodialysis catheter-10/04/2022 MEDICATIONS: Heparin 4000 units IV; TPA 4 mg into the AV fistula. CONTRAST:  40 cc Omnipaque 300 ANESTHESIA/SEDATION: Moderate (conscious) sedation was employed during this procedure. A total of Versed 4 mg and Fentanyl 200 mcg was administered intravenously. Moderate Sedation Time: 107 minutes. The patient's level of consciousness and vital signs were monitored continuously by radiology nursing throughout the procedure under my direct supervision. FLUOROSCOPY TIME:  19 minutes (35 mGy) COMPLICATIONS: None immediate. TECHNIQUE: Informed written consent was obtained from the patient after a discussion of the risks, benefits and alternatives to treatment. Questions regarding the procedure were encouraged and answered. A timeout was performed prior to the initiation of the procedure. 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 The left forearm was prepped and draped in usual sterile fashion. Maximum barrier sterile technique with sterile gowns and gloves were used for the procedure. Under ultrasound guidance, the AV fistula  was accessed at the arterial limb directed towards the venous outflow allowing for placement of a 7 French vascular sheath. Despite the use of a Kumpe catheter, there is great difficulty advancing the glidewire beyond the peripheral most aneurism of the AV fistula As such, the occluded draining venous limb was accessed directed towards the anastomosis under direct ultrasound guidance allowing for placement of a micropuncture sheath. Next, stiff glidewire was advanced to the level of the initial 7 [place] vascular sheath access. An ensnare was utilized to grasp the stiff glidewire which was then pulled through the aneurysm to the level of the venous outflow. Limited contrast injection confirmed appropriate positioning. A left upper extremity central venogram was performed. Next, the occluded segment of the AV fistula was laced with 4 mg of tPA via Kumpe catheter. Next, thrombosed segment of the AV fistula was angioplastied at multiple stations with a 6 mm x 4 cm Conquest balloon. Mechanical thrombectomy was then performed with the use of both on Angiojet device as well as a Fogarty occlusion balloon. Initial images demonstrate restored patency of the AV fistula with residual near occlusive mural thrombus. As such, the previously thrombosed segment of the AV fistula were angioplastied with a 7 mm x 4 cm Conquest balloon and several additional rounds of mechanical thrombectomy was performed both with the Angiojet device as well as the Fogarty occlusion balloon. Completion images were obtained and while there was restored flow to the AV fistula, there was a significant amount of nonocclusive mural thrombus which appeared refractory to continued intervention. As such the decision was made to either confirmed or placed a new tunneled hemodialysis catheter to ensure durable dialysis access. At this point, the procedure was terminated. All wires, catheters and sheaths were removed from the patient. Hemostasis was achieved at  the access site with deployment of a swizzle sutures which will be removed at the patient's next dialysis session. Dressings  were placed. _________________________________________________________ Attention was now paid towards placement of a tunneled hemodialysis catheter. The right neck, chest and external portion of the pre-existing right internal jugular approach temporary hemodialysis catheter was prepped and draped in usual sterile fashion. As the temporary hemodialysis catheter puncture site was deemed to be too high the right internal jugular vein was accessed at a more caudal location allowing advancement of a microwire to the level of the superior cavoatrial junction. The microwire was utilized for measurement purposes and a stiff glidewire was advanced to the level of the IVC. Next, a 19 cm tip to cuff palindrome hemodialysis catheter was tunneled from a entrance site on right anterior upper chest to the venotomy site. The pre-existing temporary hemodialysis catheter was removed intact. Peel-away sheath was placed and the dialysis catheter was inserted with tip ultimately terminating within the superior aspect of the right atrium. Catheter was noted to easily aspirate and flush. Postprocedural chest radiograph was negative for pneumothorax. Hemodialysis catheter was flushed with a appropriate heparin dwell. The patient tolerated the procedure without immediate postprocedural complication. FINDINGS: The existing left forearm radial-cephalic AV fistula is patent from the level of the anastomosis to its arterial stick zone however thrombosed centrally from that location to the distal humerus. 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 The cephalic vein restored patency at the level of the mid humerus and is patent to the level of the cephalic arch. The central venous  system of the left upper extremity is widely patent to the level of the superior cavoatrial junction The AV fistula was successfully recannulated following pharmacological and mechanical thrombectomy interventions as detailed above including angioplasty of the thrombosed segments ultimately to 7 mm diameter. Completion images demonstrate restored patency to the fistula though there is a large amount of irregular nonocclusive mural thrombus which was refractory to continued intervention. As such the decision was made to place a new tunneled hemodialysis catheter to ensure durable intravenous access. Note, the recently placed temporary dialysis catheter was deemed too high and access site to allow for conversion. After successful ultrasound and fluoroscopic guided placement, a 19 cm tip to cuff tunneled hemodialysis catheter is appropriately positioned with end terminating within the superior aspect of the right atrium. IMPRESSION: 1. Technically successful left forearm radial-cephalic thrombolysis though with a moderate-to-large amount of residual nonocclusive thrombus which was refractory to continued percutaneous intervention. Given my concern for the lack of durability of this attempted declot, I placed a tunneled right internal jugular approach dialysis catheter to ensure durable dialysis access. 2. The draining left cephalic vein is patent from the level of the mid humerus to the level of the cephalic arch. 3. The central venous system of the right upper extremity is widely patent. ACCESS: - Recommend attempted access of the patient's left forearm AV fistula. - If three successful dialysis sessions are achieved via the fistula, the tunneled hemodialysis catheter may be removed as indicated. Electronically Signed   By: Simonne Come M.D.   On: 10/06/2022 18:31   IR Fluoro Guide CV Line Right  Result Date: 10/04/2022 CLINICAL DATA:  Occluded dialysis fistula and need for temporary dialysis catheter for immediate  dialysis needs. EXAM: TUNNELED CENTRAL VENOUS HEMODIALYSIS CATHETER PLACEMENT WITH ULTRASOUND AND FLUOROSCOPIC GUIDANCE ANESTHESIA/SEDATION: None MEDICATIONS: None FLUOROSCOPY: 30 seconds.  12.0 mGy. PROCEDURE: The procedure, risks, benefits, and alternatives were explained to the patient. Questions regarding  the procedure were encouraged and answered. The patient understands and consents to the procedure. A timeout was performed prior to initiating the procedure. The right neck and chest were prepped with chlorhexidine in a sterile fashion, and a sterile drape was applied covering the operative field. Maximum barrier sterile technique with sterile gowns and gloves were used for the procedure. Local anesthesia was provided with 1% lidocaine. Ultrasound was used to confirm patency of the right internal jugular vein. A permanent ultrasound image was saved and recorded. After creating a small venotomy incision, a 21 gauge needle was advanced into the right internal jugular vein under direct, real-time ultrasound guidance. Ultrasound image documentation was performed. After securing guidewire access the venotomy was dilated over the wire. A 16 cm, 13 French, triple lumen Mahurkar non tunneled hemodialysis catheter was chosen for placement. The catheter was then placed over the guidewire. Final catheter positioning was confirmed and documented with a fluoroscopic spot image. The catheter was aspirated, flushed with saline, and injected with appropriate volume heparin dwells. The catheter exit site was secured with 0-Prolene retention sutures. COMPLICATIONS: None.  No pneumothorax. FINDINGS: After catheter placement, the tip lies at the SVC/RA junction. The catheter aspirates normally and is ready for immediate use. IMPRESSION: Placement of non tunneled hemodialysis catheter via the right internal jugular vein. The catheter tip lies at the SVC/RA junction. The catheter is ready for immediate use. Electronically Signed    By: Irish Lack M.D.   On: 10/04/2022 17:01   IR US Guide Vasc Access Right  Result Date: 10/04/2022 CLINICAL DATA:  Occluded dialysis fistula and need for temporary dialysis catheter for immediate dialysis needs. EXAM: TUNNELED CENTRAL VENOUS HEMODIALYSIS CATHETER PLACEMENT WITH ULTRASOUND AND FLUOROSCOPIC GUIDANCE ANESTHESIA/SEDATION: None MEDICATIONS: None FLUOROSCOPY: 30 seconds.  12.0 mGy. PROCEDURE: The procedure, risks, benefits, and alternatives were explained to the patient. Questions regarding the procedure were encouraged and answered. The patient understands and consents to the procedure. A timeout was performed prior to initiating the procedure. The right neck and chest were prepped with chlorhexidine in a sterile fashion, and a sterile drape was applied covering the operative field. Maximum barrier sterile technique with sterile gowns and gloves were used for the procedure. Local anesthesia was provided with 1% lidocaine. Ultrasound was used to confirm patency of the right internal jugular vein. A permanent ultrasound image was saved and recorded. After creating a small venotomy incision, a 21 gauge needle was advanced into the right internal jugular vein under direct, real-time ultrasound guidance. Ultrasound image documentation was performed. After securing guidewire access the venotomy was dilated over the wire. A 16 cm, 13 French, triple lumen Mahurkar non tunneled hemodialysis catheter was chosen for placement. The catheter was then placed over the guidewire. Final catheter positioning was confirmed and documented with a fluoroscopic spot image. The catheter was aspirated, flushed with saline, and injected with appropriate volume heparin dwells. The catheter exit site was secured with 0-Prolene retention sutures. COMPLICATIONS: None.  No pneumothorax. FINDINGS: After catheter placement, the tip lies at the SVC/RA junction. The catheter aspirates normally and is ready for immediate use.  IMPRESSION: Placement of non tunneled hemodialysis catheter via the right internal jugular vein. The catheter tip lies at the SVC/RA junction. The catheter is ready for immediate use. Electronically Signed   By: Irish Lack M.D.   On: 10/04/2022 17:01   DG Chest 2 View  Result Date: 10/04/2022 CLINICAL DATA:  Shortness of breath, not able to complete dialysis due to fistula EXAM: CHEST -  2 VIEW COMPARISON:  08/13/2022 FINDINGS: Cardiomegaly. Diffuse bilateral interstitial opacity. The visualized skeletal structures are unremarkable. IMPRESSION: Cardiomegaly with diffuse bilateral interstitial opacity, likely edema. No focal airspace opacity. Electronically Signed   By: Jearld Lesch M.D.   On: 10/04/2022 10:39    Microbiology: Results for orders placed or performed during the hospital encounter of 10/26/22  Blood culture (routine x 2)     Status: None (Preliminary result)   Collection Time: 10/26/22 11:20 AM   Specimen: BLOOD RIGHT ARM  Result Value Ref Range Status   Specimen Description BLOOD RIGHT ARM  Final   Special Requests   Final    BOTTLES DRAWN AEROBIC AND ANAEROBIC Blood Culture adequate volume   Culture   Final    NO GROWTH 4 DAYS Performed at [facility], [address] [place], [place] [address]    Report Status PENDING  Incomplete  Blood culture (routine x 2)     Status: Abnormal   Collection Time: 10/26/22 11:27 AM   Specimen: BLOOD  Result Value Ref Range Status   Specimen Description BLOOD FOREARM  Final   Special Requests   Final    BOTTLES DRAWN AEROBIC AND ANAEROBIC Blood Culture results may not be optimal due to an excessive volume of blood received in culture bottles   Culture  Setup Time   Final    GRAM POSITIVE COCCI AEROBIC BOTTLE ONLY CRITICAL VALUE NOTED.  VALUE IS CONSISTENT WITH PREVIOUSLY REPORTED AND CALLED VALUE.    Culture (A)  Final    STAPHYLOCOCCUS AUREUS SUSCEPTIBILITIES PERFORMED ON PREVIOUS CULTURE WITHIN THE LAST 5  DAYS. Performed at [facility], [address], [place], [place] [address]    Report Status 10/28/2022 FINAL  Final  Culture, blood (Routine X 2) w Reflex to ID Panel     Status: None (Preliminary result)   Collection Time: 10/27/22  2:50 PM   Specimen: BLOOD RIGHT ARM  Result Value Ref Range Status   Specimen Description BLOOD RIGHT ARM  Final   Special Requests   Final    BOTTLES DRAWN AEROBIC AND ANAEROBIC Blood Culture adequate volume   Culture   Final    NO GROWTH 3 DAYS Performed at [facility], [address], [place], [place] [address]    Report Status PENDING  Incomplete  Culture, blood (Routine X 2) w Reflex to ID Panel     Status: None (Preliminary result)   Collection Time: 10/27/22  2:50 PM   Specimen: BLOOD  Result Value Ref Range Status   Specimen Description BLOOD RIGHT ANTECUBITAL  Final   Special Requests   Final    BOTTLES DRAWN AEROBIC AND ANAEROBIC Blood Culture adequate volume   Culture   Final    NO GROWTH 3 DAYS Performed at [facility], [address] [place], [place] [address]    Report Status PENDING  Incomplete   Labs: CBC: Recent Labs  Lab 10/24/22 1428 10/26/22 1120 10/27/22 0423 10/28/22 0352 10/28/22 1239 10/30/22 0443  WBC 7.6 5.1 4.8 5.0  --  6.0  NEUTROABS 6.9 3.1  --   --   --   --   HGB 10.5* 9.1* 9.4* 9.1* 12.2* 9.9*  HCT 34.0* 28.3* 29.4* 30.3* 36.0* 32.3*  MCV 81.5 78.8* 79.5* 80.8  --  78.8*  PLT 230 158 162 205  --  322   Basic Metabolic Panel: Recent Labs  Lab 10/24/22 1428 10/26/22 1120 10/27/22 0423 10/28/22 0352  10/28/22 1239 10/30/22 0443  NA 132* 131* 134* 133* 135 130*  K 3.6 3.7 3.8 3.5 3.5 4.0  CL 92* 92* 92* 88* 94* 91*  CO2 23 22 28 28   --  24  GLUCOSE 122* 106* 120* 136* 125* 157*  BUN 31* 57* 33* 50* 30* 73*  CREATININE 10.51* 16.64* 11.32* 13.72* 9.30* 13.35*  CALCIUM 8.0* 6.9* 7.7* 7.4*  --  7.7*  MG  --   --   --  2.2  --   --   PHOS  --   --  6.1* 6.1*  --  5.8*   Liver  Function Tests: Recent Labs  Lab 10/24/22 1428 10/26/22 1120 10/27/22 0423 10/28/22 0352 10/30/22 0443  AST 15 13*  --   --   --   ALT 11 19  --   --   --   ALKPHOS 55 46  --   --   --   BILITOT 0.9 0.6  --   --   --   PROT 7.8 7.3  --   --   --   ALBUMIN 3.6 3.5 3.2* 3.1* 3.2*   CBG: Recent Labs  Lab 10/29/22 1202 10/29/22 1534 10/29/22 2025 10/30/22 0750 10/30/22 1158  GLUCAP 140* 102* 164* 172* 119*    Discharge time spent: greater than 30 minutes.  Signed: Lynden Oxford, MD [facility]

## 2022-10-30 NOTE — Progress Notes (Signed)
POST HD TX NOTE  10/30/22 1836  Vitals  Temp 98.3 F (36.8 C)  Temp Source Oral  BP (!) 147/87  MAP (mmHg) 102  BP Location Right Arm  BP Method Automatic  Patient Position (if appropriate) Lying  Pulse Rate 66  Pulse Rate Source Monitor  ECG Heart Rate 67  Resp 18  Oxygen Therapy  SpO2 100 %  O2 Device Room Air  Pulse Oximetry Type Continuous  During Treatment Monitoring  Intra-Hemodialysis Comments (S)   (post HD tx VS check)  Post Treatment  Dialyzer Clearance Lightly streaked  Duration of HD Treatment -hour(s) 3.5 hour(s)  Hemodialysis Intake (mL) 100 mL (Ancef)  Liters Processed 73.5  Fluid Removed (mL) 3500 mL ( (value from machine) - (ancef) = )  Tolerated HD Treatment Yes  Post-Hemodialysis Comments (S)  tx completed w/o problem, UF goal met, blood rinsed back, VSS. Medication Admin: Heparin 2000 units bolus, Ancef 2g IVPB  AVG/AVF Arterial Site Held (minutes) 7 minutes  AVG/AVF Venous Site Held (minutes) 7 minutes  Fistula / Graft Left Upper arm  No placement date or time found.   Orientation: Left  Access Location: Upper arm  Site Condition No complications  Fistula / Graft Assessment Bruit;Thrill;Present;Aneurysm present (see note from 1426)  Drainage Description None

## 2022-10-30 NOTE — Progress Notes (Signed)
[name] Progress Note    Assessment/ Plan:    MSSA bacteremia with endocarditis: Blood Cx 4/12 +, repeat 4/14 here positive. Susceptibilities pending, felt likely will be MSSA. ID consulting, on Cefazolin 2gm with HD until 12/07/22. TDC removed 4/17. Abx arranged at outpatient unit  ESRD:  Continue HD per usual TTS schedule - AVF working ok for now, felt that will likely fail soon per notes. Will need outpateint evaluation for new access after abx course finished. HD today  Hypertension/volume: BP ok, mild pulm edema in CXR, UF as tolerated.  Anemia: Hgb 9.9 - not due to ESA yet. Avoid IV Fe. Transfuse prn  Metabolic bone disease: Ca low, follow after HD. Continue VDRA.  T2DM- per primary  Outpatient Dialysis Orders:  TTS - [facility] 4hr, 450/500, EDW 89kg, 1K/2.25Ca bath, AVF + TDC (awaiting removal), heparin 500 unit bolus + 1000/hr heparin infusion - Mircera IV q 2 weeks (just given, not yet due) - Venofer 50mg  IV weekly - Calcitriol PO q HD  Anthony Sar, MD [facility]  Subjective:   Patient seen and examined this am. No acute events. S/p tee with endocarditis. No complaints   Objective:   BP (!) 145/98 (BP Location: Right Arm)   Pulse 65   Temp 98.5 F (36.9 C)   Resp 18   Wt 90.3 kg   SpO2 100%   BMI 27.00 kg/m   Intake/Output Summary (Last 24 hours) at 10/30/2022 1058 Last data filed at 10/29/2022 1703 Gross per 24 hour  Intake 360 ml  Output --  Net 360 ml   Weight change:   Physical Exam: Gen: NAD CVS: RRR Resp: normal wob Abd: nd Ext: no edema Neuro: awake, alert Dialysis access: LUE AVF +b/t  Imaging: ECHO TEE  Result Date: 10/28/2022    TRANSESOPHOGEAL ECHO REPORT   Patient Name:   [name] Date of Exam: 10/28/2022 Medical Rec #:  [id number]       Height:       72.0 in Accession #:    [id number]      Weight:       199.1 lb Date of Birth:  07-08-1986       BSA:          2.126 m Patient Age:     36 years        BP:           185/98 mmHg Patient Gender: M               HR:           70 bpm. Exam Location:  Inpatient Procedure: Transesophageal Echo, 3D Echo, Color Doppler and Cardiac Doppler Indications:     Endocarditis  History:         Patient has prior history of Echocardiogram examinations, most                  recent 10/27/2022. Risk Factors:Hypertension, Diabetes,                  Dyslipidemia and ESRD.  Sonographer:     Irving Burton Senior RDCS Referring Phys:  [id number] Perlie Gold Diagnosing Phys: Thomasene Ripple DO PROCEDURE: After discussion of the risks and benefits of a TEE, an informed consent was obtained from the patient. The transesophogeal probe was passed without difficulty through the esophogus of the patient. Sedation performed by different physician. The patient was monitored while under deep sedation. Anesthestetic sedation was  provided intravenously by Anesthesiology:  of Propofol,  of Lidocaine. The patient developed no complications during the procedure.  IMPRESSIONS  1. Left ventricular ejection fraction, by estimation, is 55 to 60%. The left ventricle has normal function. Left ventricular diastolic function could not be evaluated.  2. Right ventricular systolic function is normal. The right ventricular size is normal.  3. No left atrial/left atrial appendage thrombus was detected.  4. A small pericardial effusion is present. The pericardial effusion is localized near the right atrium and anterior to the right ventricle.  5. Small mobile mass noted on the anterior mitral leaflet ( best seen frame 77) suspected to be a vegetation in the setting of bactermia. Mild mitral valve regurgitation. No evidence of mitral stenosis.  6. Mobile mass on the tricuspid valve - suspect this may be on the chordae not clearly seen on the valve leaflet, suspected to be a vegetation in the setting of bactermia.  7. Very small oscillating mass on the left coronary cusp of the aortic valve suspected to  be a vegetation in the setting of bactermia. Aortic valve regurgitation is trivial. No aortic stenosis is present.  8. Mild pulmonic stenosis. Conclusion(s)/Recommendation(s): Findings are concerning for vegetation/infective endocarditis as detailed above. FINDINGS  Left Ventricle: Left ventricular ejection fraction, by estimation, is 55 to 60%. The left ventricle has normal function. The left ventricular internal cavity size was normal in size. Left ventricular diastolic function could not be evaluated. Right Ventricle: The right ventricular size is normal. No increase in right ventricular wall thickness. Right ventricular systolic function is normal. Left Atrium: Left atrial size was not well visualized. No left atrial/left atrial appendage thrombus was detected. Right Atrium: Right atrial size was not well visualized. Pericardium: A small pericardial effusion is present. The pericardial effusion is localized near the right atrium and anterior to the right ventricle. Mitral Valve: Small mobile mass noted on the anterior mitral leaflet ( best seen frame 77) suspected to be a vegetation in the setting of bactermia. Mild mitral valve regurgitation. No evidence of mitral valve stenosis. Tricuspid Valve: Mobile mass on the tricuspid valve - suspect this may be on the chordae not clearly seen on the valve leaflet, suspected to be a vegetation in the setting of bactermia. Aortic Valve: Very small oscillating mass on the left coronary cusp of the aortic valve suspected to be a vegetation in the setting of bactermia. Aortic valve regurgitation is trivial. No aortic stenosis is present. Pulmonic Valve: The pulmonic valve was normal in structure. Pulmonic valve regurgitation is not visualized. Mild pulmonic stenosis. Aorta: The aortic root and ascending aorta are structurally normal, with no evidence of dilitation. Pulmonary Artery: The pulmonary artery is of normal size. Venous: The left upper pulmonary vein, left lower  pulmonary vein, right upper pulmonary vein and right lower pulmonary vein are normal. A normal flow pattern is recorded from the right upper pulmonary vein. IAS/Shunts: No atrial level shunt detected by color flow Doppler. Additional Comments: Spectral Doppler performed. Thomasene Ripple DO Electronically signed by Thomasene Ripple DO Signature Date/Time: 10/28/2022/1:53:25 PM    Final    EP STUDY  Result Date: 10/28/2022 See surgical note for result.   Labs: BMET Recent Labs  Lab 10/24/22 1428 10/26/22 1120 10/27/22 0423 10/28/22 0352 10/28/22 1239 10/30/22 0443  NA 132* 131* 134* 133* 135 130*  K 3.6 3.7 3.8 3.5 3.5 4.0  CL 92* 92* 92* 88* 94* 91*  CO2 --  24  GLUCOSE 122* 106* 120* 136* 125* 157*  BUN 31* 57* 33* 50* 30* 73*  CREATININE 10.51* 16.64* 11.32* 13.72* 9.30* 13.35*  CALCIUM 8.0* 6.9* 7.7* 7.4*  --  7.7*  PHOS  --   --  6.1* 6.1*  --  5.8*   CBC Recent Labs  Lab 10/24/22 1428 10/26/22 1120 10/27/22 0423 10/28/22 0352 10/28/22 1239 10/30/22 0443  WBC 7.6 5.1 4.8 5.0  --  6.0  NEUTROABS 6.9 3.1  --   --   --   --   HGB 10.5* 9.1* 9.4* 9.1* 12.2* 9.9*  HCT 34.0* 28.3* 29.4* 30.3* 36.0* 32.3*  MCV 81.5 78.8* 79.5* 80.8  --  78.8*  PLT 230 158 162 205  --  322    Medications:     (feeding supplement) PROSource Plus  30 mL Oral BID BM   atorvastatin  40 mg Oral Daily   calcitRIOL  1 mcg Oral Once per day on Tue Thu Sat   carvedilol  25 mg Oral BID WC   Chlorhexidine Gluconate Cloth  6 each Topical Q0600   cloNIDine  0.1 mg Oral BID   heparin  5,000 Units Subcutaneous Q8H   insulin aspart  0-5 Units Subcutaneous QHS   insulin aspart  0-6 Units Subcutaneous TID WC   sodium chloride flush  3 mL Intravenous Q12H      Anthony Sar, MD [facility] 10/30/2022, 10:58 AM

## 2022-10-31 LAB — CULTURE, BLOOD (ROUTINE X 2)
Culture: NO GROWTH
Culture: NO GROWTH
Special Requests: ADEQUATE

## 2022-11-01 ENCOUNTER — Telehealth: Payer: Self-pay

## 2022-11-01 LAB — CULTURE, BLOOD (ROUTINE X 2): Special Requests: ADEQUATE

## 2022-11-01 NOTE — Progress Notes (Signed)
Late Note Entry  Pt was d/c to home on Saturday. Contacted [facility] this morning and spoke to [place]. Clinic advised of pt's d/c date and that pt will need iv cefazolin with HD (discussed with RN on Fri). D/C summary, last renal note, ID note from 4/19, and pharmacy note from 4/19 faxed to clinic for continuation of care. Clinic advised pt should resume care tomorrow.   Olivia Canter Renal Navigator [phone]

## 2022-11-01 NOTE — Transitions of Care (Post Inpatient/ED Visit) (Signed)
   11/01/2022  Name: [name] MRN: [id number] DOB: [birth year]  Today's TOC FU Call Status: Today's TOC FU Call Status:: Successful TOC FU Call Competed TOC FU Call Complete Date: 11/01/22  Transition Care Management Follow-up Telephone Call Date of Discharge: 10/30/22 Discharge Facility: [facility]) Type of Discharge: Inpatient Admission Primary Inpatient Discharge Diagnosis:: bacteremia How have you been since you were released from the hospital?: Better (He stated he is feeling "pretty good, no complaints.") Any questions or concerns?: No  Items Reviewed: Did you receive and understand the discharge instructions provided?: Yes Medications obtained and verified?: Yes (Medications Reviewed) (He said he has all of his medications as well as a working glucometer  and did not have any questions about the med regime.  He receives the ancef while at dialysis) Any new allergies since your discharge?: No Dietary orders reviewed?: Yes Type of Diet Ordered:: heart healthy diabetic Do you have support at home?: Yes  Home Care and Equipment/Supplies: Were Home Health Services Ordered?: No Any new equipment or medical supplies ordered?: No  Functional Questionnaire: Do you need assistance with bathing/showering or dressing?: No Do you need assistance with meal preparation?: No Do you need assistance with eating?: No Do you have difficulty maintaining continence: No Do you need assistance with getting out of bed/getting out of a chair/moving?: No Do you have difficulty managing or taking your medications?: No  Follow up appointments reviewed: PCP Follow-up appointment confirmed?: Yes Date of PCP follow-up appointment?: 11/08/22 Follow-up Provider: Dr Andrey Campanile.   Patient requested to follow up with Dr Andrey Campanile [facility] Follow-up appointment confirmed?: Yes Date of Specialist follow-up appointment?: 11/23/22 Follow-Up Specialty Provider:: [facility] Do you need transportation to  your follow-up appointment?: No Do you understand care options if your condition(s) worsen?: Yes-patient verbalized understanding    SIGNATURE Robyne Peers, RN

## 2022-11-02 ENCOUNTER — Telehealth: Payer: Self-pay | Admitting: Licensed Clinical Social Worker

## 2022-11-02 NOTE — Telephone Encounter (Signed)
I contacted patient to introduce myself and evaluate any needs. Patient was a little bit "out of it" and stated that he had just received some type of treatment and he would like for [place] to talk tomorrow. I will give patient a call back tomorrow.

## 2022-11-08 ENCOUNTER — Encounter: Payer: Medicaid Other | Admitting: Family Medicine

## 2022-11-10 NOTE — Progress Notes (Signed)
Patient did not show for appt. This was opened in err

## 2022-11-23 ENCOUNTER — Inpatient Hospital Stay: Payer: Medicaid Other | Admitting: Infectious Diseases

## 2023-01-17 ENCOUNTER — Emergency Department (HOSPITAL_COMMUNITY)
Admission: EM | Admit: 2023-01-17 | Discharge: 2023-01-18 | Disposition: A | Discharge status: Home / Self Care | Payer: Medicare Other | Attending: Emergency Medicine | Admitting: Emergency Medicine

## 2023-01-17 ENCOUNTER — Encounter (HOSPITAL_COMMUNITY): Payer: Self-pay

## 2023-01-17 DIAGNOSIS — I120 Hypertensive chronic kidney disease with stage 5 chronic kidney disease or end stage renal disease: Secondary | ICD-10-CM | POA: Insufficient documentation

## 2023-01-17 DIAGNOSIS — Z992 Dependence on renal dialysis: Secondary | ICD-10-CM | POA: Diagnosis not present

## 2023-01-17 DIAGNOSIS — Z794 Long term (current) use of insulin: Secondary | ICD-10-CM | POA: Diagnosis not present

## 2023-01-17 DIAGNOSIS — E875 Hyperkalemia: Secondary | ICD-10-CM | POA: Diagnosis not present

## 2023-01-17 DIAGNOSIS — Z79899 Other long term (current) drug therapy: Secondary | ICD-10-CM | POA: Insufficient documentation

## 2023-01-17 DIAGNOSIS — Z1152 Encounter for screening for COVID-19: Secondary | ICD-10-CM | POA: Insufficient documentation

## 2023-01-17 DIAGNOSIS — R1010 Upper abdominal pain, unspecified: Secondary | ICD-10-CM | POA: Diagnosis not present

## 2023-01-17 DIAGNOSIS — M791 Myalgia, unspecified site: Secondary | ICD-10-CM | POA: Diagnosis not present

## 2023-01-17 DIAGNOSIS — N186 End stage renal disease: Secondary | ICD-10-CM | POA: Insufficient documentation

## 2023-01-17 DIAGNOSIS — E1122 Type 2 diabetes mellitus with diabetic chronic kidney disease: Secondary | ICD-10-CM | POA: Diagnosis not present

## 2023-01-17 DIAGNOSIS — R110 Nausea: Secondary | ICD-10-CM | POA: Insufficient documentation

## 2023-01-17 DIAGNOSIS — R6883 Chills (without fever): Secondary | ICD-10-CM | POA: Diagnosis present

## 2023-01-17 DIAGNOSIS — R509 Fever, unspecified: Secondary | ICD-10-CM | POA: Insufficient documentation

## 2023-01-17 DIAGNOSIS — R6889 Other general symptoms and signs: Secondary | ICD-10-CM

## 2023-01-17 LAB — RESP PANEL BY RT-PCR (RSV, FLU A&B, COVID)  RVPGX2
Influenza A by PCR: NEGATIVE
Influenza B by PCR: NEGATIVE
Resp Syncytial Virus by PCR: NEGATIVE
SARS Coronavirus 2 by RT PCR: NEGATIVE

## 2023-01-17 NOTE — ED Triage Notes (Signed)
Pt states that he has had chills, congestion, and body aches x 2 days. Denies cough. Pt is dialysis pt (T,Th,Sat);

## 2023-01-18 ENCOUNTER — Other Ambulatory Visit: Payer: Self-pay

## 2023-01-18 ENCOUNTER — Encounter (HOSPITAL_COMMUNITY): Payer: Self-pay | Admitting: Emergency Medicine

## 2023-01-18 ENCOUNTER — Emergency Department (HOSPITAL_COMMUNITY): Payer: Medicare Other

## 2023-01-18 ENCOUNTER — Emergency Department (HOSPITAL_COMMUNITY)
Admission: EM | Admit: 2023-01-18 | Discharge: 2023-01-18 | Disposition: A | Discharge status: Home / Self Care | Payer: Medicare Other | Source: Home / Self Care | Attending: Emergency Medicine | Admitting: Emergency Medicine

## 2023-01-18 DIAGNOSIS — Z794 Long term (current) use of insulin: Secondary | ICD-10-CM | POA: Insufficient documentation

## 2023-01-18 DIAGNOSIS — N186 End stage renal disease: Secondary | ICD-10-CM | POA: Insufficient documentation

## 2023-01-18 DIAGNOSIS — E1122 Type 2 diabetes mellitus with diabetic chronic kidney disease: Secondary | ICD-10-CM | POA: Insufficient documentation

## 2023-01-18 DIAGNOSIS — I120 Hypertensive chronic kidney disease with stage 5 chronic kidney disease or end stage renal disease: Secondary | ICD-10-CM | POA: Insufficient documentation

## 2023-01-18 DIAGNOSIS — M791 Myalgia, unspecified site: Secondary | ICD-10-CM | POA: Diagnosis not present

## 2023-01-18 DIAGNOSIS — Z79899 Other long term (current) drug therapy: Secondary | ICD-10-CM | POA: Insufficient documentation

## 2023-01-18 DIAGNOSIS — R1010 Upper abdominal pain, unspecified: Secondary | ICD-10-CM | POA: Insufficient documentation

## 2023-01-18 DIAGNOSIS — E875 Hyperkalemia: Secondary | ICD-10-CM | POA: Insufficient documentation

## 2023-01-18 DIAGNOSIS — Z992 Dependence on renal dialysis: Secondary | ICD-10-CM | POA: Insufficient documentation

## 2023-01-18 LAB — COMPREHENSIVE METABOLIC PANEL WITH GFR
ALT: 13 U/L (ref 0–44)
AST: 16 U/L (ref 15–41)
Albumin: 4.1 g/dL (ref 3.5–5.0)
Alkaline Phosphatase: 56 U/L (ref 38–126)
Anion gap: 20 — ABNORMAL HIGH (ref 5–15)
BUN: 86 mg/dL — ABNORMAL HIGH (ref 6–20)
CO2: 17 mmol/L — ABNORMAL LOW (ref 22–32)
Calcium: 6.8 mg/dL — ABNORMAL LOW (ref 8.9–10.3)
Chloride: 99 mmol/L (ref 98–111)
Creatinine, Ser: 16.11 mg/dL — ABNORMAL HIGH (ref 0.61–1.24)
GFR, Estimated: 4 mL/min — ABNORMAL LOW
Glucose, Bld: 174 mg/dL — ABNORMAL HIGH (ref 70–99)
Potassium: 5.3 mmol/L — ABNORMAL HIGH (ref 3.5–5.1)
Sodium: 136 mmol/L (ref 135–145)
Total Bilirubin: 0.7 mg/dL (ref 0.3–1.2)
Total Protein: 7.8 g/dL (ref 6.5–8.1)

## 2023-01-18 LAB — BASIC METABOLIC PANEL
Anion gap: 18 — ABNORMAL HIGH (ref 5–15)
BUN: 81 mg/dL — ABNORMAL HIGH (ref 6–20)
CO2: 22 mmol/L (ref 22–32)
Calcium: 6.7 mg/dL — ABNORMAL LOW (ref 8.9–10.3)
Chloride: 93 mmol/L — ABNORMAL LOW (ref 98–111)
Creatinine, Ser: 15.2 mg/dL — ABNORMAL HIGH (ref 0.61–1.24)
GFR, Estimated: 4 mL/min — ABNORMAL LOW (ref >60)
Glucose, Bld: 130 mg/dL — ABNORMAL HIGH (ref 70–99)
Potassium: 6.4 mmol/L (ref 3.5–5.1)
Sodium: 133 mmol/L — ABNORMAL LOW (ref 135–145)

## 2023-01-18 LAB — CBC WITH DIFFERENTIAL/PLATELET
Abs Immature Granulocytes: 0.01 10*3/uL (ref 0.00–0.07)
Basophils Absolute: 0 10*3/uL (ref 0.0–0.1)
Basophils Relative: 1 %
Eosinophils Absolute: 0.1 10*3/uL (ref 0.0–0.5)
Eosinophils Relative: 2 %
HCT: 44.1 % (ref 39.0–52.0)
Hemoglobin: 13.4 g/dL (ref 13.0–17.0)
Immature Granulocytes: 0 %
Lymphocytes Relative: 20 %
Lymphs Abs: 1.2 10*3/uL (ref 0.7–4.0)
MCH: 25.1 pg — ABNORMAL LOW (ref 26.0–34.0)
MCHC: 30.4 g/dL (ref 30.0–36.0)
MCV: 82.7 fL (ref 80.0–100.0)
Monocytes Absolute: 0.6 10*3/uL (ref 0.1–1.0)
Monocytes Relative: 10 %
Neutro Abs: 3.9 10*3/uL (ref 1.7–7.7)
Neutrophils Relative %: 67 %
Platelets: 241 10*3/uL (ref 150–400)
RBC: 5.33 MIL/uL (ref 4.22–5.81)
RDW: 19.9 % — ABNORMAL HIGH (ref 11.5–15.5)
WBC: 5.8 10*3/uL (ref 4.0–10.5)
nRBC: 0 % (ref 0.0–0.2)

## 2023-01-18 LAB — LACTIC ACID, PLASMA: Lactic Acid, Venous: 0.7 mmol/L (ref 0.5–1.9)

## 2023-01-18 LAB — CBC
HCT: 41.9 % (ref 39.0–52.0)
Hemoglobin: 12.8 g/dL — ABNORMAL LOW (ref 13.0–17.0)
MCH: 26 pg (ref 26.0–34.0)
MCHC: 30.5 g/dL (ref 30.0–36.0)
MCV: 85.2 fL (ref 80.0–100.0)
Platelets: 237 10*3/uL (ref 150–400)
RBC: 4.92 MIL/uL (ref 4.22–5.81)
RDW: 19.3 % — ABNORMAL HIGH (ref 11.5–15.5)
WBC: 6.1 10*3/uL (ref 4.0–10.5)
nRBC: 0 % (ref 0.0–0.2)

## 2023-01-18 LAB — LIPASE, BLOOD: Lipase: 35 U/L (ref 11–51)

## 2023-01-18 LAB — HEPATITIS B SURFACE ANTIGEN: Hepatitis B Surface Ag: NONREACTIVE

## 2023-01-18 MED ORDER — CLONIDINE HCL 0.1 MG PO TABS: 0.1000 mg | ORAL_TABLET | Freq: Two times a day (BID) | ORAL | 2 refills | Status: DC

## 2023-01-18 MED ORDER — INSULIN GLARGINE-YFGN 100 UNIT/ML ~~LOC~~ SOLN
12.0000 [IU] | Freq: Every day | SUBCUTANEOUS | 2 refills | Status: DC
Start: 2023-01-18 — End: 2023-08-15

## 2023-01-18 MED ORDER — METOCLOPRAMIDE HCL 5 MG PO TABS: 5.0000 mg | ORAL_TABLET | Freq: Three times a day (TID) | ORAL | 0 refills | Status: DC | PRN

## 2023-01-18 MED ORDER — SEVELAMER CARBONATE 800 MG PO TABS: 1600.0000 mg | ORAL_TABLET | Freq: Three times a day (TID) | ORAL | 2 refills | Status: AC

## 2023-01-18 MED ORDER — IOHEXOL 350 MG/ML SOLN
60.0000 mL | Freq: Once | INTRAVENOUS | Status: AC | PRN
  Administered 2023-01-18: 60 mL via INTRAVENOUS

## 2023-01-18 MED ORDER — ATORVASTATIN CALCIUM 40 MG PO TABS: 40.0000 mg | ORAL_TABLET | Freq: Every day | ORAL | 2 refills | Status: AC

## 2023-01-18 MED ORDER — CLONIDINE HCL 0.1 MG PO TABS
0.1000 mg | ORAL_TABLET | Freq: Once | ORAL | Status: AC
  Administered 2023-01-18: 0.1 mg via ORAL
  Filled 2023-01-18: qty 1

## 2023-01-18 MED ORDER — HEPARIN SODIUM (PORCINE) 1000 UNIT/ML IJ SOLN
INTRAMUSCULAR | Status: AC
  Administered 2023-01-18: 1000 [IU]
  Filled 2023-01-18: qty 5

## 2023-01-18 MED ORDER — CARVEDILOL 25 MG PO TABS: 25.0000 mg | ORAL_TABLET | Freq: Two times a day (BID) | ORAL | 2 refills | Status: DC

## 2023-01-18 MED ORDER — ONDANSETRON 4 MG PO TBDP
4.0000 mg | ORAL_TABLET | Freq: Once | ORAL | Status: AC
  Administered 2023-01-18: 4 mg via ORAL
  Filled 2023-01-18: qty 1

## 2023-01-18 MED ORDER — SODIUM ZIRCONIUM CYCLOSILICATE 10 G PO PACK
10.0000 g | PACK | ORAL | Status: AC
  Administered 2023-01-18: 10 g via ORAL
  Filled 2023-01-18: qty 1

## 2023-01-18 MED ORDER — METOCLOPRAMIDE HCL 5 MG/ML IJ SOLN: 10.0000 mg | Freq: Once | INTRAMUSCULAR | Status: DC

## 2023-01-18 MED ORDER — CARVEDILOL 12.5 MG PO TABS
25.0000 mg | ORAL_TABLET | Freq: Once | ORAL | Status: AC
  Administered 2023-01-18: 25 mg via ORAL
  Filled 2023-01-18: qty 2

## 2023-01-18 MED ORDER — CHLORHEXIDINE GLUCONATE CLOTH 2 % EX PADS: 6.0000 | MEDICATED_PAD | Freq: Every day | CUTANEOUS | Status: DC

## 2023-01-18 MED ORDER — INSULIN LISPRO 100 UNIT/ML IJ SOLN: 1.0000 [IU] | INTRAMUSCULAR | 2 refills | Status: DC

## 2023-01-18 NOTE — Progress Notes (Addendum)
Asked by ED to see this patient for one time dialysis. Pt is f/b [facility] where he takes HD on a TTS schedule. He missed his Sat HD last weekend, and was in the ED at [facility] today for c/o abd pain. He does not require admission. On exam pt is alert and responsive, lungs are clear, trace ankle edema, abd soft, not on O2. Spoke with his HD team in [place] and HD orders are as below. The plan will be for "ED HD". Patient is not being admitted. He will go to dialysis unit upstairs when they are ready for him. Then he will get dialysis. When dialysis is completed pt will be sent back to ED for reassessment.   OP HD: [facility]  4h   89kg  450/500  1K/2.5Ca bath  L AVF  Heparin 500u then 1000u/hr   Vinson Moselle  MD  [facility] 01/18/2023, 4:27 PM  Recent Labs  Lab 01/18/23 0305 01/18/23 1055  HGB 13.4 12.8*  ALBUMIN  --  4.1  CALCIUM 6.7* 6.8*  CREATININE 15.20* 16.11*  K 6.4* 5.3*    Inpatient medications:  [START ON 01/19/2023] Chlorhexidine Gluconate Cloth  6 each Topical Q0600   heparin sodium (porcine)       metoCLOPramide (REGLAN) injection  10 mg Intravenous Once    heparin sodium (porcine)

## 2023-01-18 NOTE — ED Triage Notes (Signed)
Patient arrives ambulatory by POV c/o generalized abdominal pain along with N/V/D onset of since Sunday. Dialysis pt with left arm restricted. Pt is sight impaired.

## 2023-01-18 NOTE — ED Provider Notes (Signed)
Care of patient assumed Dr. Rubin Payor.  Patient presented with abdominal pain, nausea, vomiting and diarrhea.  He was seen in the ED last night where he was given [facility] for hyperkalemia.  He is due for dialysis today.  Potassium, although improved, remains mildly elevated.  Plan will be for hemodialysis from the ED if possible.  Callback from nephrology pending. Physical Exam  BP (!) 181/111   Pulse 84   Temp 98.2 F (36.8 C) (Temporal)   Resp 18   Ht 6' (1.829 m)   Wt 86.2 kg   SpO2 99%   BMI 25.77 kg/m   Physical Exam Vitals and nursing note reviewed.  Constitutional:      General: He is not in acute distress.    Appearance: He is well-developed. He is not ill-appearing, toxic-appearing or diaphoretic.  HENT:     Head: Normocephalic and atraumatic.  Eyes:     Extraocular Movements: Extraocular movements intact.     Conjunctiva/sclera: Conjunctivae normal.  Cardiovascular:     Rate and Rhythm: Normal rate and regular rhythm.  Pulmonary:     Effort: Pulmonary effort is normal. No respiratory distress.  Abdominal:     General: Abdomen is flat.  Musculoskeletal:        General: No swelling.     Cervical back: Neck supple.  Skin:    General: Skin is warm and dry.  Neurological:     General: No focal deficit present.     Mental Status: He is alert and oriented to person, place, and time.  Psychiatric:        Mood and Affect: Mood normal.        Behavior: Behavior normal.     Procedures  Procedures  ED Course / MDM    Medical Decision Making Amount and/or Complexity of Data Reviewed Labs: ordered. Radiology: ordered.  Risk Prescription drug management.   I spoke with nephrologist, Dr. Arlean Hopping, who will attempt to arrange dialysis.  On assessment, patient resting in left lateral decubitus position.  He does endorse some ongoing mild nausea.  Reglan was ordered.  Patient states that he undergoes HD on TTS.  He did miss both today and Saturday.  Last session was on  Thursday.  He does continue to make urine.  Patient requested refills of all home medications.  This was ordered.  Dr. Arta Silence informed that he will able to be dialyzed today from the ED.  Patient returned from dialysis in good condition.  Nausea remains resolved.  His blood pressure remains elevated and doses of his home medications were ordered in the ED.  He was discharged in stable condition.       Gloris Manchester, MD 01/18/23 [id number]

## 2023-01-18 NOTE — Discharge Instructions (Addendum)
Prescription refills for your home medications were sent to your pharmacy.  A prescription for a new medication called metoclopramide was also sent to your pharmacy.  Take this as needed for nausea.  Return to the emergency department for any new or worsening symptoms of concern.

## 2023-01-18 NOTE — Progress Notes (Signed)
   01/18/23 2144  Vitals  Temp 98 F (36.7 C)  Temp Source Oral  BP (!) 192/114  MAP (mmHg) 138  BP Location Right Arm  BP Method Automatic  Patient Position (if appropriate) Lying  Pulse Rate 86  Pulse Rate Source Monitor  Resp 16  Oxygen Therapy  SpO2 96 %  O2 Device Room Air  During Treatment Monitoring  Intra-Hemodialysis Comments Tx completed  Post Treatment  Dialyzer Clearance Lightly streaked  Duration of HD Treatment -hour(s) 4 hour(s)  Hemodialysis Intake (mL) 0 mL  Liters Processed 87.9  Fluid Removed (mL) 3900 mL  Tolerated HD Treatment Yes  Post-Hemodialysis Comments pt stable  AVG/AVF Arterial Site Held (minutes) 10 minutes  AVG/AVF Venous Site Held (minutes) 10 minutes   Pt stable post treatment.Pt tolerated treatment without complications

## 2023-01-18 NOTE — ED Provider Notes (Addendum)
[place] EMERGENCY DEPARTMENT AT [facility] Provider Note   CSN: [id number] Arrival date & time: 01/18/23  1021     History  Chief Complaint  Patient presents with   Abdominal Pain    [name] is a 37 y.o. male.   Abdominal Pain Patient presents upper abdominal pain.  Nausea vomiting and diarrhea.  Recently seen for URI type symptoms.  Now states that is cleared up and now is just abdomen.  Is a dialysis patient on Tuesday Thursday Saturday.  Was dialyzed Saturday had seen last night with hyperkalemia but was not dialyzed today.  Decreased appetite.  States he also needs help with transportation to dialysis.    Past Medical History:  Diagnosis Date   ADHD (attention deficit hyperactivity disorder)    Diabetes mellitus    ESRD on hemodialysis (HCC)    [facility]   High cholesterol    Hypertension    Hypertrophic cardiomyopathy (HCC) 02/26/2021   Seizure (HCC) 02/08/2021    Home Medications Prior to Admission medications   Medication Sig Start Date End Date Taking? Authorizing Provider  acetaminophen (TYLENOL) 325 MG tablet Take 2 tablets (650 mg total) by mouth every 6 (six) hours as needed for mild pain (or Fever >/= 101). 12/30/21   Rhetta Mura, MD  atorvastatin (LIPITOR) 40 MG tablet Take 40 mg by mouth daily.    [provider]  B Complex-C-Folic Acid (RENA-VITE RX) 1 MG TABS Take 1 tablet by mouth daily. 06/28/22   [provider]  calcitRIOL (ROCALTROL) 0.5 MCG capsule Take 2 capsules (1 mcg total) by mouth Every Tuesday,Thursday,and Saturday with dialysis. 12/31/21   Rhetta Mura, MD  carvedilol (COREG) 25 MG tablet Take 1 tablet (25 mg total) by mouth 2 (two) times daily with a meal. 03/05/21   Russella Dar, NP  cloNIDine (CATAPRES) 0.1 MG tablet Take 0.1 mg by mouth 2 (two) times daily. 04/20/22   [provider]  insulin glargine-yfgn (SEMGLEE) 100 UNIT/ML injection Inject 0.12 mLs (12  Units total) into the skin at bedtime. 07/09/22   Rema Fendt, NP  insulin lispro (HUMALOG) 100 UNIT/ML injection Inject 1-7 Units into the skin See admin instructions. Per sliding scale 3 times daily with meals 151-200= 1 unit 201-250= 2 units 251-300= 3 units 301-350= 5 units 351-400= 7 units Greater than 400 call md takes only as needed if blood sugar is over 200    [provider]  Insulin Pen Needle (PEN NEEDLES) 31G X 8 MM MISC UAD 07/09/22   Rema Fendt, NP  Multiple Vitamins-Minerals (MULTIVITAMIN WITH MINERALS) tablet Take 1 tablet by mouth daily.    [provider]  multivitamin (RENA-VIT) TABS tablet Take 1 tablet by mouth at bedtime. Patient not taking: Reported on 10/30/2021 11/27/20   [name], MD  ondansetron (ZOFRAN) 4 MG tablet Take 4 mg by mouth every 8 (eight) hours as needed for nausea or vomiting. 04/12/21   [provider]  prednisoLONE acetate (PRED FORTE) 1 % ophthalmic suspension Place 1 drop into the left eye See admin instructions. 2-3 times daily 10/15/21   [provider]  sevelamer carbonate (RENVELA) 800 MG tablet Take 2 tablets (1,600 mg total) by mouth 3 (three) times daily with meals. Patient not taking: Reported on 12/29/2021 03/05/21   Russella Dar, NP      Allergies    Amlodipine and Hydralazine    Review of Systems   Review of Systems  Gastrointestinal:  Positive for abdominal pain.    Physical Exam Updated Vital Signs BP (!) 181/111   Pulse 84   Temp 98.2 F (36.8 C) (Temporal)   Resp 18   Ht 6' (1.829 m)   Wt 86.2 kg   SpO2 99%   BMI 25.77 kg/m  Physical Exam Vitals and nursing note reviewed.  Cardiovascular:     Rate and Rhythm: Normal rate.  Abdominal:     Tenderness: There is abdominal tenderness.     Comments: Upper abdominal tenderness no rebound or guarding.  No hernia palpated.  Skin:    Capillary Refill: Capillary refill takes less than 2 seconds.  Neurological:     Mental  Status: He is alert.     ED Results / Procedures / Treatments   Labs (all labs ordered are listed, but only abnormal results are displayed) Labs Reviewed  COMPREHENSIVE METABOLIC PANEL - Abnormal; Notable for the following components:      Result Value   Potassium 5.3 (*)    CO2 17 (*)    Glucose, Bld 174 (*)    BUN 86 (*)    Creatinine, Ser 16.11 (*)    Calcium 6.8 (*)    GFR, Estimated 4 (*)    Anion gap 20 (*)    All other components within normal limits  CBC - Abnormal; Notable for the following components:   Hemoglobin 12.8 (*)    RDW 19.3 (*)    All other components within normal limits  LIPASE, BLOOD    EKG None  Radiology DG Chest 2 View  Result Date: 01/18/2023 CLINICAL DATA:  Fever and chills EXAM: CHEST - 2 VIEW COMPARISON:  Chest x-ray 10/26/2022 FINDINGS: Right sided central venous catheter has been removed. Heart is enlarged. There central pulmonary vascular congestion. There is no lung consolidation, pleural effusion or pneumothorax. No acute fractures are seen. IMPRESSION: Cardiomegaly with central pulmonary vascular congestion. Electronically Signed   By: Darliss Cheney M.D.   On: 01/18/2023 03:38    Procedures Procedures    Medications Ordered in ED Medications  iohexol (OMNIPAQUE) 350 MG/ML injection 60 mL (60 mLs Intravenous Contrast Given 01/18/23 1510)    ED Course/ Medical Decision Making/ A&P                             Medical Decision Making Amount and/or Complexity of Data Reviewed Labs: ordered. Radiology: ordered.  Risk Prescription drug management.   Patient is a dialysis patient upper abdominal pain with nausea vomiting diarrhea.  Some tenderness.  Will get CT scan to evaluate.  LFTs are reassuring Although tach centimeters is mildly elevated.  5.3 now was 6.4 last night.  Negative COVID testing yesterday.  After CT scan potentially would need dialysis since we will not be able to get done today and does have some hyperkalemia.  Also  states he is not able to get to dialysis due to transportation issues.    Care turned over to Dr Durwin Nora.  CT scan done and overall reassuring.  Somewhat dilated stomach.  Will discuss with nephrology about dialysis.       Final Clinical Impression(s) / ED Diagnoses Final diagnoses:  End stage renal disease on dialysis [facility])  Pain of upper abdomen    Rx / DC Orders ED Discharge Orders     None         Benjiman Core, MD 01/18/23 1528    Benjiman Core, MD  01/18/23 1547  

## 2023-01-18 NOTE — Discharge Instructions (Signed)
Go to your dialysis session this morning

## 2023-01-18 NOTE — ED Provider Notes (Signed)
[place] EMERGENCY DEPARTMENT AT [facility] Provider Note   CSN: [id number] Arrival date & time: 01/17/23  2036     History  Chief Complaint  Patient presents with   Chills    [name] is a 37 y.o. male.  Patient presents to the emergency department for evaluation of feeling like he has had a fever.  Symptoms ongoing for a couple of days.  Patient feels chills, achy all over and has had nausea.  No cough, abdominal pain.  He makes urine about once a day, no dysuria.  Dialysis Tuesday Thursday Saturday.       Home Medications Prior to Admission medications   Medication Sig Start Date End Date Taking? Authorizing Provider  acetaminophen (TYLENOL) 325 MG tablet Take 2 tablets (650 mg total) by mouth every 6 (six) hours as needed for mild pain (or Fever >/= 101). 12/30/21   Rhetta Mura, MD  atorvastatin (LIPITOR) 40 MG tablet Take 40 mg by mouth daily.    [provider]  B Complex-C-Folic Acid (RENA-VITE RX) 1 MG TABS Take 1 tablet by mouth daily. 06/28/22   [provider]  calcitRIOL (ROCALTROL) 0.5 MCG capsule Take 2 capsules (1 mcg total) by mouth Every Tuesday,Thursday,and Saturday with dialysis. 12/31/21   Rhetta Mura, MD  carvedilol (COREG) 25 MG tablet Take 1 tablet (25 mg total) by mouth 2 (two) times daily with a meal. 03/05/21   Russella Dar, NP  cloNIDine (CATAPRES) 0.1 MG tablet Take 0.1 mg by mouth 2 (two) times daily. 04/20/22   [provider]  insulin glargine-yfgn (SEMGLEE) 100 UNIT/ML injection Inject 0.12 mLs (12 Units total) into the skin at bedtime. 07/09/22   Rema Fendt, NP  insulin lispro (HUMALOG) 100 UNIT/ML injection Inject 1-7 Units into the skin See admin instructions. Per sliding scale 3 times daily with meals 151-200= 1 unit 201-250= 2 units 251-300= 3 units 301-350= 5 units 351-400= 7 units Greater than 400 call md takes only as needed if blood sugar is over 200    [provider]  Insulin Pen Needle (PEN NEEDLES) 31G X 8 MM MISC UAD 07/09/22   Rema Fendt, NP  Multiple Vitamins-Minerals (MULTIVITAMIN WITH MINERALS) tablet Take 1 tablet by mouth daily.    [provider]  multivitamin (RENA-VIT) TABS tablet Take 1 tablet by mouth at bedtime. Patient not taking: Reported on 10/30/2021 11/27/20   [name], MD  ondansetron (ZOFRAN) 4 MG tablet Take 4 mg by mouth every 8 (eight) hours as needed for nausea or vomiting. 04/12/21   [provider]  prednisoLONE acetate (PRED FORTE) 1 % ophthalmic suspension Place 1 drop into the left eye See admin instructions. 2-3 times daily 10/15/21   [provider]  sevelamer carbonate (RENVELA) 800 MG tablet Take 2 tablets (1,600 mg total) by mouth 3 (three) times daily with meals. Patient not taking: Reported on 12/29/2021 03/05/21   Russella Dar, NP      Allergies    Amlodipine and Hydralazine    Review of Systems   Review of Systems  Physical Exam Updated Vital Signs BP (!) 193/129 (BP Location: Right Arm)   Pulse 73   Temp 98 F (36.7 C) (Oral)   Resp 18   SpO2 98%  Physical Exam Vitals and nursing note reviewed.  Constitutional:      General: He is not in acute distress.    Appearance: He is well-developed.  HENT:  Head: Normocephalic and atraumatic.     Mouth/Throat:     Mouth: Mucous membranes are moist.  Eyes:     General: Vision grossly intact. Gaze aligned appropriately.     Extraocular Movements: Extraocular movements intact.     Conjunctiva/sclera: Conjunctivae normal.  Cardiovascular:     Rate and Rhythm: Normal rate and regular rhythm.     Pulses: Normal pulses.     Heart sounds: Normal heart sounds, S1 normal and S2 normal. No murmur heard.    No friction rub. No gallop.  Pulmonary:     Effort: Pulmonary effort is normal. No respiratory distress.     Breath sounds: Normal breath sounds.  Abdominal:     Palpations: Abdomen is soft.     Tenderness:  There is no abdominal tenderness. There is no guarding or rebound.     Hernia: No hernia is present.  Musculoskeletal:        General: No swelling.     Cervical back: Full passive range of motion without pain, normal range of motion and neck supple. No pain with movement, spinous process tenderness or muscular tenderness. Normal range of motion.     Right lower leg: No edema.     Left lower leg: No edema.  Skin:    General: Skin is warm and dry.     Capillary Refill: Capillary refill takes less than 2 seconds.     Findings: No ecchymosis, erythema, lesion or wound.  Neurological:     Mental Status: He is alert and oriented to person, place, and time.     GCS: GCS eye subscore is 4. GCS verbal subscore is 5. GCS motor subscore is 6.     Cranial Nerves: Cranial nerves 2-12 are intact.     Sensory: Sensation is intact.     Motor: Motor function is intact. No weakness or abnormal muscle tone.     Coordination: Coordination is intact.  Psychiatric:        Mood and Affect: Mood normal.        Speech: Speech normal.        Behavior: Behavior normal.     ED Results / Procedures / Treatments   Labs (all labs ordered are listed, but only abnormal results are displayed) Labs Reviewed  CBC WITH DIFFERENTIAL/PLATELET - Abnormal; Notable for the following components:      Result Value   MCH 25.1 (*)    RDW 19.9 (*)    All other components within normal limits  BASIC METABOLIC PANEL - Abnormal; Notable for the following components:   Sodium 133 (*)    Potassium 6.4 (*)    Chloride 93 (*)    Glucose, Bld 130 (*)    BUN 81 (*)    Creatinine, Ser 15.20 (*)    Calcium 6.7 (*)    GFR, Estimated 4 (*)    Anion gap 18 (*)    All other components within normal limits  RESP PANEL BY RT-PCR (RSV, FLU A&B, COVID)  RVPGX2  CULTURE, BLOOD (SINGLE)  LACTIC ACID, PLASMA    EKG None  Radiology DG Chest 2 View  Result Date: 01/18/2023 CLINICAL DATA:  Fever and chills EXAM: CHEST - 2 VIEW  COMPARISON:  Chest x-ray 10/26/2022 FINDINGS: Right sided central venous catheter has been removed. Heart is enlarged. There central pulmonary vascular congestion. There is no lung consolidation, pleural effusion or pneumothorax. No acute fractures are seen. IMPRESSION: Cardiomegaly with central pulmonary vascular congestion. Electronically Signed   By:  Darliss Cheney M.D.   On: 01/18/2023 03:38    Procedures Procedures    Medications Ordered in ED Medications  ondansetron (ZOFRAN-ODT) disintegrating tablet 4 mg (has no administration in time range)  sodium zirconium cyclosilicate (LOKELMA) packet 10 g (10 g Oral Given 01/18/23 0441)    ED Course/ Medical Decision Making/ A&P                             Medical Decision Making Amount and/or Complexity of Data Reviewed Labs: ordered. Radiology: ordered.  Risk Prescription drug management.   Differential diagnosis considered includes, but not limited to: Viral illness; bacteremia; sepsis; pneumonia  Presents with flulike illness for 2 days.  Patient appears well.  Vital signs unremarkable other than hypertension which is chronic.  Patient is a dialysis patient.  Influenza, COVID-negative.  Basic workup has been reassuring.  He does have mildly elevated potassium consistent with known ESRD.  He is due for dialysis later today.  Will give Lokelma, discharge, follow-up for dialysis.        Final Clinical Impression(s) / ED Diagnoses Final diagnoses:  Flu-like symptoms    Rx / DC Orders ED Discharge Orders     None         Adea Geisel, Canary Brim, MD 01/18/23 [phone]

## 2023-01-19 ENCOUNTER — Ambulatory Visit: Payer: Medicaid Other | Admitting: Family

## 2023-01-19 LAB — HEPATITIS B SURFACE ANTIBODY, QUANTITATIVE: Hep B S AB Quant (Post): 8672 m[IU]/mL

## 2023-01-21 LAB — CULTURE, BLOOD (SINGLE)
Culture: NO GROWTH
Special Requests: ADEQUATE

## 2023-01-23 LAB — CULTURE, BLOOD (SINGLE)

## 2023-03-18 NOTE — Progress Notes (Signed)
This encounter was created in error - please disregard.

## 2023-04-04 ENCOUNTER — Other Ambulatory Visit: Payer: Self-pay

## 2023-04-04 ENCOUNTER — Encounter (HOSPITAL_COMMUNITY): Payer: Self-pay | Admitting: Emergency Medicine

## 2023-04-04 ENCOUNTER — Emergency Department (HOSPITAL_COMMUNITY)
Admission: EM | Admit: 2023-04-04 | Discharge: 2023-04-05 | Disposition: A | Discharge status: Home / Self Care | Payer: No Typology Code available for payment source | Attending: Emergency Medicine | Admitting: Emergency Medicine

## 2023-04-04 DIAGNOSIS — S39012A Strain of muscle, fascia and tendon of lower back, initial encounter: Secondary | ICD-10-CM | POA: Diagnosis not present

## 2023-04-04 DIAGNOSIS — Z794 Long term (current) use of insulin: Secondary | ICD-10-CM | POA: Diagnosis not present

## 2023-04-04 DIAGNOSIS — S8992XA Unspecified injury of left lower leg, initial encounter: Secondary | ICD-10-CM | POA: Diagnosis present

## 2023-04-04 DIAGNOSIS — Z79899 Other long term (current) drug therapy: Secondary | ICD-10-CM | POA: Insufficient documentation

## 2023-04-04 DIAGNOSIS — S81812A Laceration without foreign body, left lower leg, initial encounter: Secondary | ICD-10-CM | POA: Insufficient documentation

## 2023-04-04 DIAGNOSIS — Z23 Encounter for immunization: Secondary | ICD-10-CM | POA: Insufficient documentation

## 2023-04-04 DIAGNOSIS — R519 Headache, unspecified: Secondary | ICD-10-CM | POA: Diagnosis not present

## 2023-04-04 DIAGNOSIS — Y92410 Unspecified street and highway as the place of occurrence of the external cause: Secondary | ICD-10-CM | POA: Diagnosis not present

## 2023-04-04 MED ORDER — TETANUS-DIPHTH-ACELL PERTUSSIS 5-2.5-18.5 LF-MCG/0.5 IM SUSY
0.5000 mL | PREFILLED_SYRINGE | Freq: Once | INTRAMUSCULAR | Status: AC
  Administered 2023-04-05: 0.5 mL via INTRAMUSCULAR
  Filled 2023-04-04: qty 0.5

## 2023-04-04 MED ORDER — LIDOCAINE-EPINEPHRINE (PF) 2 %-1:200000 IJ SOLN
10.0000 mL | Freq: Once | INTRAMUSCULAR | Status: AC
  Administered 2023-04-05: 10 mL
  Filled 2023-04-04: qty 20

## 2023-04-04 MED ORDER — OXYCODONE-ACETAMINOPHEN 5-325 MG PO TABS
1.0000 | ORAL_TABLET | Freq: Once | ORAL | Status: AC
  Administered 2023-04-05: 1 via ORAL
  Filled 2023-04-04: qty 1
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
## 2023-04-05 DIAGNOSIS — S81812A Laceration without foreign body, left lower leg, initial encounter: Secondary | ICD-10-CM | POA: Diagnosis not present

## 2023-04-05 NOTE — Discharge Instructions (Addendum)
Sutures and leg will need to be removed on October 4.
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
## 2023-05-22 ENCOUNTER — Emergency Department (HOSPITAL_COMMUNITY): Payer: Medicare Other

## 2023-05-22 ENCOUNTER — Emergency Department (HOSPITAL_COMMUNITY)
Admission: EM | Admit: 2023-05-22 | Discharge: 2023-05-22 | Disposition: A | Discharge status: Home / Self Care | Payer: Medicare Other | Attending: Emergency Medicine | Admitting: Emergency Medicine

## 2023-05-22 ENCOUNTER — Encounter (HOSPITAL_COMMUNITY): Payer: Self-pay | Admitting: *Deleted

## 2023-05-22 ENCOUNTER — Other Ambulatory Visit: Payer: Self-pay

## 2023-05-22 DIAGNOSIS — I132 Hypertensive heart and chronic kidney disease with heart failure and with stage 5 chronic kidney disease, or end stage renal disease: Secondary | ICD-10-CM | POA: Insufficient documentation

## 2023-05-22 DIAGNOSIS — Z1152 Encounter for screening for COVID-19: Secondary | ICD-10-CM | POA: Diagnosis not present

## 2023-05-22 DIAGNOSIS — E11649 Type 2 diabetes mellitus with hypoglycemia without coma: Secondary | ICD-10-CM | POA: Diagnosis not present

## 2023-05-22 DIAGNOSIS — Z992 Dependence on renal dialysis: Secondary | ICD-10-CM | POA: Diagnosis not present

## 2023-05-22 DIAGNOSIS — R0602 Shortness of breath: Secondary | ICD-10-CM | POA: Diagnosis not present

## 2023-05-22 DIAGNOSIS — I5032 Chronic diastolic (congestive) heart failure: Secondary | ICD-10-CM | POA: Diagnosis not present

## 2023-05-22 DIAGNOSIS — E1122 Type 2 diabetes mellitus with diabetic chronic kidney disease: Secondary | ICD-10-CM | POA: Diagnosis not present

## 2023-05-22 DIAGNOSIS — I5033 Acute on chronic diastolic (congestive) heart failure: Secondary | ICD-10-CM | POA: Insufficient documentation

## 2023-05-22 DIAGNOSIS — R110 Nausea: Secondary | ICD-10-CM | POA: Diagnosis not present

## 2023-05-22 DIAGNOSIS — N186 End stage renal disease: Secondary | ICD-10-CM | POA: Insufficient documentation

## 2023-05-22 DIAGNOSIS — Z794 Long term (current) use of insulin: Secondary | ICD-10-CM | POA: Insufficient documentation

## 2023-05-22 DIAGNOSIS — R4182 Altered mental status, unspecified: Secondary | ICD-10-CM | POA: Insufficient documentation

## 2023-05-22 DIAGNOSIS — R0789 Other chest pain: Secondary | ICD-10-CM | POA: Insufficient documentation

## 2023-05-22 DIAGNOSIS — Z20822 Contact with and (suspected) exposure to covid-19: Secondary | ICD-10-CM | POA: Insufficient documentation

## 2023-05-22 DIAGNOSIS — E162 Hypoglycemia, unspecified: Secondary | ICD-10-CM

## 2023-05-22 LAB — COMPREHENSIVE METABOLIC PANEL
ALT: 19 U/L (ref 0–44)
AST: 14 U/L — ABNORMAL LOW (ref 15–41)
Albumin: 3.7 g/dL (ref 3.5–5.0)
Alkaline Phosphatase: 57 U/L (ref 38–126)
Anion gap: 14 (ref 5–15)
BUN: 37 mg/dL — ABNORMAL HIGH (ref 6–20)
CO2: 25 mmol/L (ref 22–32)
Calcium: 7.3 mg/dL — ABNORMAL LOW (ref 8.9–10.3)
Chloride: 96 mmol/L — ABNORMAL LOW (ref 98–111)
Creatinine, Ser: 10.51 mg/dL — ABNORMAL HIGH (ref 0.61–1.24)
GFR, Estimated: 6 mL/min — ABNORMAL LOW (ref >60)
Glucose, Bld: 158 mg/dL — ABNORMAL HIGH (ref 70–99)
Potassium: 3.4 mmol/L — ABNORMAL LOW (ref 3.5–5.1)
Sodium: 135 mmol/L (ref 135–145)
Total Bilirubin: 0.4 mg/dL (ref <1.2)
Total Protein: 8 g/dL (ref 6.5–8.1)

## 2023-05-22 LAB — CBC WITH DIFFERENTIAL/PLATELET
Abs Immature Granulocytes: 0.02 10*3/uL (ref 0.00–0.07)
Basophils Absolute: 0.1 10*3/uL (ref 0.0–0.1)
Basophils Relative: 1 %
Eosinophils Absolute: 0.1 10*3/uL (ref 0.0–0.5)
Eosinophils Relative: 1 %
HCT: 36.4 % — ABNORMAL LOW (ref 39.0–52.0)
Hemoglobin: 11.4 g/dL — ABNORMAL LOW (ref 13.0–17.0)
Immature Granulocytes: 0 %
Lymphocytes Relative: 18 %
Lymphs Abs: 1.3 10*3/uL (ref 0.7–4.0)
MCH: 26.4 pg (ref 26.0–34.0)
MCHC: 31.3 g/dL (ref 30.0–36.0)
MCV: 84.3 fL (ref 80.0–100.0)
Monocytes Absolute: 0.8 10*3/uL (ref 0.1–1.0)
Monocytes Relative: 11 %
Neutro Abs: 4.9 10*3/uL (ref 1.7–7.7)
Neutrophils Relative %: 69 %
Platelets: 305 10*3/uL (ref 150–400)
RBC: 4.32 MIL/uL (ref 4.22–5.81)
RDW: 18.2 % — ABNORMAL HIGH (ref 11.5–15.5)
WBC: 7.1 10*3/uL (ref 4.0–10.5)
nRBC: 0 % (ref 0.0–0.2)

## 2023-05-22 LAB — CBG MONITORING, ED
Glucose-Capillary: 45 mg/dL — ABNORMAL LOW (ref 70–99)
Glucose-Capillary: 95 mg/dL (ref 70–99)

## 2023-05-22 LAB — RESP PANEL BY RT-PCR (RSV, FLU A&B, COVID)  RVPGX2
Influenza A by PCR: NEGATIVE
Influenza B by PCR: NEGATIVE
Resp Syncytial Virus by PCR: NEGATIVE
SARS Coronavirus 2 by RT PCR: NEGATIVE

## 2023-05-22 LAB — ETHANOL: Alcohol, Ethyl (B): <10 mg/dL (ref <10)

## 2023-05-22 LAB — TROPONIN I (HIGH SENSITIVITY)
Troponin I (High Sensitivity): 52 ng/L — ABNORMAL HIGH (ref <18)
Troponin I (High Sensitivity): 46 ng/L — ABNORMAL HIGH (ref <18)

## 2023-05-22 MED ORDER — DEXTROSE 50 % IV SOLN
1.0000 | Freq: Once | INTRAVENOUS | Status: AC
  Administered 2023-05-22: 50 mL via INTRAVENOUS

## 2023-05-22 MED ORDER — DEXTROSE 50 % IV SOLN
INTRAVENOUS | Status: AC
  Filled 2023-05-22: qty 50

## 2023-05-22 MED ORDER — ONDANSETRON HCL 4 MG/2ML IJ SOLN
4.0000 mg | Freq: Once | INTRAMUSCULAR | Status: AC
  Administered 2023-05-22: 4 mg via INTRAVENOUS
  Filled 2023-05-22: qty 2

## 2023-05-22 NOTE — Discharge Instructions (Addendum)
While you are in the emergency department, you had labs done.  Your labs did not show any significant abnormalities from your baseline.  I think that your blood sugar being low is why you are feeling so bad.  I would like you to check your blood sugar 3 times per day, and definitely before you give yourself any insulin.  Please discuss with your primary care doctor getting the easier to use insulin pens.  Your primary care doctors address is  [phone] [employer] [address]

## 2023-05-22 NOTE — ED Notes (Signed)
Patient c/o that his blood sugar felt low.   Nurse checked blood sugar, CBG 45.   Order received to give D50.

## 2023-05-22 NOTE — ED Provider Notes (Signed)
[place] EMERGENCY DEPARTMENT AT [facility] Provider Note  CSN: [id number] Arrival date & time: 05/22/23 1110  Chief Complaint(s) Chest Pain  HPI [name] is a 37 y.o. male here today for feeling unwell.  Patient says that he woke up this morning and had some nausea, felt pain in his chest and felt like he had a difficult time breathing.  Patient does Tuesday Thursday Saturday dialysis, did complete a full session yesterday.  He has felt nauseated but has not vomited.  Is not having any pain in his abdomen.  Has not had a fever or chills.   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) 12/29/2021   Status post surgery 12/29/2021   Hypoglycemia 10/30/2021   ESRD (end stage renal disease) on dialysis (HCC) 03/12/2021   Hypertrophic cardiomyopathy (HCC) 02/26/2021   Syncope 02/08/2021   DKA (diabetic ketoacidosis) (HCC) 02/08/2021   Hyperglycemia    Diabetic foot ulcer (HCC) 11/24/2020   Hypervolemia associated with renal insufficiency 08/07/2020   Uremia 08/07/2020   Metabolic acidosis 08/07/2020   Nausea and vomiting 05/07/2020   Dehydration    Hypertensive urgency 02/25/2020   Anemia 12/02/2019   Essential hypertension 12/02/2019   SOB (shortness of breath) 12/02/2019   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 Authorizing Provider  acetaminophen (TYLENOL) 325 MG tablet Take 2 tablets (650 mg total) by mouth every 6 (six) hours as needed for mild pain (or Fever >/= 101). 12/30/21   Rhetta Mura, MD  atorvastatin (LIPITOR) 40 MG tablet Take 1 tablet (40 mg total) by mouth daily. 01/18/23 04/18/23  Gloris Manchester, MD  B Complex-C-Folic Acid (RENA-VITE RX) 1 MG TABS Take 1 tablet by mouth daily. 06/28/22   [provider]  calcitRIOL (ROCALTROL) 0.5 MCG capsule Take 2 capsules (1 mcg total) by mouth Every Tuesday,Thursday,and Saturday with dialysis. 12/31/21   Rhetta Mura, MD  carvedilol (COREG) 25 MG tablet Take 1 tablet (25 mg total) by mouth 2 (two) times daily with a meal. 01/18/23 04/18/23  Gloris Manchester, MD  cloNIDine (CATAPRES) 0.1 MG tablet Take 1 tablet (0.1 mg total) by mouth 2 (two) times daily. 01/18/23   Gloris Manchester, MD  insulin glargine-yfgn (SEMGLEE) 100 UNIT/ML injection Inject 0.12 mLs (12 Units total) into the skin at bedtime. 01/18/23   Gloris Manchester, MD  insulin lispro (HUMALOG) 100 UNIT/ML injection Inject 0.01-0.07 mLs (1-7 Units total) into the skin See admin instructions. Per sliding scale 3 times daily with meals 151-200= 1 unit 201-250= 2 units 251-300= 3 units 301-350= 5 units 351-400= 7 units Greater than 400 call md takes only as needed if blood sugar is over 200 01/18/23   Gloris Manchester, MD  Insulin Pen  Needle (PEN NEEDLES) 31G X 8 MM MISC UAD 07/09/22   Rema Fendt, NP  metoCLOPramide (REGLAN) 5 MG tablet Take 1 tablet (5 mg total) by mouth every 8 (eight) hours as needed for nausea. 01/18/23   Gloris Manchester, MD  Multiple Vitamins-Minerals (MULTIVITAMIN WITH MINERALS) tablet Take 1 tablet by mouth daily.    [provider]  multivitamin (RENA-VIT) TABS tablet Take 1 tablet by mouth at bedtime. Patient not taking: Reported on  10/30/2021 11/27/20   [name], MD  ondansetron (ZOFRAN) 4 MG tablet Take 4 mg by mouth every 8 (eight) hours as needed for nausea or vomiting. 04/12/21   [provider]  prednisoLONE acetate (PRED FORTE) 1 % ophthalmic suspension Place 1 drop into the left eye See admin instructions. 2-3 times daily 10/15/21   [provider]                                                                                                                                    Past Surgical History Past Surgical History:  Procedure Laterality Date   AMPUTATION TOE Right 12/29/2021   Procedure: AMPUTATION TOE, fifth;  Surgeon: Louann Sjogren, DPM;  Location: [facility] OR;  Service: Podiatry;  Laterality: Right;  surgical team will do local block   AV FISTULA PLACEMENT Left 08/11/2020   Procedure: LEFT UPPER EXTREMITY ARTERIOVENOUS (AV) FISTULA CREATION;  Surgeon: Leonie Douglas, MD;  Location: [facility] OR;  Service: Vascular;  Laterality: Left;   FISTULA SUPERFICIALIZATION Left 12/02/2021   Procedure: PLICATION OF LEFT RADIUS CEPHALIC FISTULA;  Surgeon: Leonie Douglas, MD;  Location[facility] OR;  Service: Vascular;  Laterality: Left;  PERIPHERAL NERVE BLOCK   FRACTURE SURGERY     I & D EXTREMITY Left 07/16/2014   Procedure: IRRIGATION AND DEBRIDEMENT EXTREMITY/LEFT INDEX FINGER;  Surgeon: Betha Loa, MD;  Location: [facility] OR;  Service: Orthopedics;  Laterality: Left;   IR FLUORO GUIDE CV LINE RIGHT  10/04/2022   IR FLUORO GUIDE CV LINE RIGHT  10/05/2022   IR PERC TUN PERIT CATH WO PORT S&I /IMAG  08/07/2020   IR REMOVAL TUN CV CATH W/O FL  10/27/2022   IR THROMBECTOMY AV FISTULA W/THROMBOLYSIS/PTA INC/SHUNT/IMG LEFT Left 10/05/2022   IR US GUIDE VASC ACCESS RIGHT  08/07/2020   IR US GUIDE VASC ACCESS RIGHT  10/04/2022   IR US GUIDE VASC ACCESS RIGHT  10/05/2022   TEE WITHOUT CARDIOVERSION N/A 10/28/2022   Procedure: TRANSESOPHAGEAL ECHOCARDIOGRAM;  Surgeon: Thomasene Ripple, DO;  Location: [facility] INVASIVE CV LAB;  Service:  Cardiovascular;  Laterality: N/A;   Family History Family History  Problem Relation Age of Onset   Cancer Mother    Stroke Father    Diabetes Father    Hypertension Father     Social History Social History   Tobacco Use   Smoking status: Never    Passive exposure: Never   Smokeless tobacco: Never  Vaping Use   Vaping status: Never Used  Substance Use Topics   Alcohol use: Not Currently    Comment: social drinker   Drug use: No   Allergies Amlodipine and Hydralazine  Review of Systems Review of Systems  Physical Exam Vital Signs  I have reviewed the triage vital signs BP (!) 145/95   Pulse 94   Temp 97.6 F (36.4 C)   Resp (!) 27   Ht 6' (1.829 m)   Wt 90.7 kg   SpO2 100%   BMI 27.12 kg/m   Physical Exam Vitals reviewed.  Constitutional:      Appearance: He is not ill-appearing.  Cardiovascular:     Rate and Rhythm: Normal rate.     Heart sounds: Normal heart sounds.  Pulmonary:     Effort: Pulmonary effort is normal. No respiratory distress.     Breath sounds: No decreased breath sounds or wheezing.  Chest:     Chest wall: No tenderness.  Abdominal:     Palpations: Abdomen is soft.  Musculoskeletal:        General: Normal range of motion.     Cervical back: Normal range of motion.  Skin:    General: Skin is warm and dry.  Neurological:     Mental Status: He is alert.     ED Results and Treatments Labs (all labs ordered are listed, but only abnormal results are displayed) Labs Reviewed  COMPREHENSIVE METABOLIC PANEL - Abnormal; Notable for the following components:      Result Value   Potassium 3.4 (*)    Chloride 96 (*)    Glucose, Bld 158 (*)    BUN 37 (*)    Creatinine, Ser 10.51 (*)    Calcium 7.3 (*)    AST 14 (*)    GFR, Estimated 6 (*)    All other components within normal limits  CBC WITH DIFFERENTIAL/PLATELET - Abnormal; Notable for the following components:   Hemoglobin 11.4 (*)    HCT 36.4 (*)    RDW 18.2 (*)    All  other components within normal limits  CBG MONITORING, ED - Abnormal; Notable for the following components:   Glucose-Capillary 45 (*)    All other components within normal limits  TROPONIN I (HIGH SENSITIVITY) - Abnormal; Notable for the following components:   Troponin I (High Sensitivity) 52 (*)    All other components within normal limits  TROPONIN I (HIGH SENSITIVITY) - Abnormal; Notable for the following components:   Troponin I (High Sensitivity) 46 (*)    All other components within normal limits  RESP PANEL BY RT-PCR (RSV, FLU A&B, COVID)  RVPGX2  CULTURE, BLOOD (ROUTINE X 2)  CULTURE, BLOOD (ROUTINE X 2)  ETHANOL  CBG MONITORING, ED                                                                                                                          Radiology DG Chest 1 View  Result Date: 05/22/2023 CLINICAL DATA:  Central chest pain. EXAM: CHEST  1 VIEW COMPARISON:  Chest radiograph dated 01/18/2023. FINDINGS: The heart size and mediastinal contours are within normal limits. Both lungs are clear. The visualized skeletal structures are unremarkable. IMPRESSION: No active disease. Electronically Signed   By: Romona Curls M.D.   On: 05/22/2023 13:41    Pertinent labs & imaging results that were available during my care of the patient were reviewed by me and considered in my medical decision making (see MDM for details).  Medications Ordered in ED Medications  ondansetron (ZOFRAN) injection 4 mg (4 mg Intravenous Given 05/22/23 1130)  dextrose 50 % solution 50 mL (50 mLs Intravenous Given 05/22/23 1134)                                                                                                                                     Procedures Procedures  (including critical care time)  Medical Decision Making / ED Course   This patient presents to the ED for concern of feeling unwell, this involves an extensive number of treatment options, and is a complaint that  carries with it a high risk of complications and morbidity.  The differential diagnosis includes ACS, bacteremia, viral syndrome, enteritis, less likely acute intra-abdominal infection, less likely obstruction, less likely PE.  MDM: Patient here with vague symptoms of feeling unwell, chest pain and difficulty breathing.  Will perform cardiac workup. Physical exam, patient's breath sounds clear.  Lower suspicion for fluid overload given that the patient did complete full session yesterday.  Basic labs ordered.  Chest x-ray ordered.  Viral panel ordered.  Considered imaging the patient's abdomen, however if labs are otherwise unremarkable, do not believe it would be indicated at this time.  Wells score 0.  Reassessment 12 PM-surprisingly, patient's glucose was 45.  He received IV dextrose.  Patient improved considerably following this.  Believe this is likely the cause of his feeling unwell.  Discussed with the patient when he last took his insulin, he says that he did not.  Looking through his medicines, does not have any long-acting hypoglycemic oral agents.  Reassessment 4:30 PM-patient's high sensitive troponin is flat.  His labs are consistent with someone on hemodialysis.  Patient feels back to his baseline.  Will discharge patient, instructed him to do 3 times per day glucose checks.  He is following up with his primary care doctor this Thursday.  Additional history obtained: -Additional history obtained from EMS -External records from outside source obtained and reviewed including: Chart review including previous notes, labs, imaging, consultation notes   Lab Tests: -I ordered, reviewed, and interpreted labs.   The pertinent results include:   Labs Reviewed  COMPREHENSIVE METABOLIC PANEL - Abnormal; Notable for the following components:      Result Value   Potassium 3.4 (*)    Chloride 96 (*)    Glucose, Bld 158 (*)  BUN 37 (*)    Creatinine, Ser 10.51 (*)    Calcium 7.3 (*)     AST 14 (*)    GFR, Estimated 6 (*)    All other components within normal limits  CBC WITH DIFFERENTIAL/PLATELET - Abnormal; Notable for the following components:   Hemoglobin 11.4 (*)    HCT 36.4 (*)    RDW 18.2 (*)    All other components within normal limits  CBG MONITORING, ED - Abnormal; Notable for the following components:   Glucose-Capillary 45 (*)    All other components within normal limits  TROPONIN I (HIGH SENSITIVITY) - Abnormal; Notable for the following components:   Troponin I (High Sensitivity) 52 (*)    All other components within normal limits  TROPONIN I (HIGH SENSITIVITY) - Abnormal; Notable for the following components:   Troponin I (High Sensitivity) 46 (*)    All other components within normal limits  RESP PANEL BY RT-PCR (RSV, FLU A&B, COVID)  RVPGX2  CULTURE, BLOOD (ROUTINE X 2)  CULTURE, BLOOD (ROUTINE X 2)  ETHANOL  CBG MONITORING, ED      EKG LVH, no change from 10/04/2022.  EKG Interpretation Date/Time:  Sunday May 22 2023 11:16:50 EST Ventricular Rate:  94 PR Interval:  174 QRS Duration:  95 QT Interval:  385 QTC Calculation: 482 R Axis:   44  Text Interpretation: Sinus rhythm Left ventricular hypertrophy Nonspecific T abnormalities, lateral leads ST elev, probable normal early repol pattern Borderline prolonged QT interval Confirmed by Anders Simmonds [phone]) on 05/22/2023 11:21:20 AM         Imaging Studies ordered: I ordered imaging studies including plain films of the chest I independently visualized and interpreted imaging. I agree with the radiologist interpretation   Medicines ordered and prescription drug management: Meds ordered this encounter  Medications   ondansetron (ZOFRAN) injection 4 mg   dextrose 50 % solution 50 mL   dextrose 50 % solution    Bevins, Michelle M: cabinet override    -I have reviewed the patients home medicines and have made adjustments as needed  Critical interventions Treatment of  hypoglycemia   Cardiac Monitoring: The patient was maintained on a cardiac monitor.  I personally viewed and interpreted the cardiac monitored which showed an underlying rhythm of: Sinus tachycardia  Social Determinants of Health:  Factors impacting patients care include:    Reevaluation: After the interventions noted above, I reevaluated the patient and found that they have :improved  Co morbidities that complicate the patient evaluation  Past Medical History:  Diagnosis Date   ADHD (attention deficit hyperactivity disorder)    Diabetes mellitus    ESRD on hemodialysis (HCC)    [facility] [facility]   High cholesterol    Hypertension    Hypertrophic cardiomyopathy (HCC) 02/26/2021   Seizure (HCC) 02/08/2021      Dispostion: I considered admission for this patient, however with his improvement, believe that he is safe for discharge with outpatient follow-up.    Final Clinical Impression(s) / ED Diagnoses Final diagnoses:  Hypoglycemia     @PCDICTATION @    Anders Simmonds T, DO 05/22/23 1658

## 2023-05-22 NOTE — ED Triage Notes (Signed)
Pt awoke this am with central chest pain that increases with inspiration.  Dialysis yesterday.  Ems EKG showed st elevation in 3 leads.  Give 324 asa and 0.4 nitroglycerin that reduced pain.    Vs 130/57 96 ra Cbg 191 Hr 96 Rr 19

## 2023-05-26 ENCOUNTER — Encounter: Payer: Medicare Other | Admitting: Family

## 2023-05-26 NOTE — Progress Notes (Signed)
Erroneous encounter-disregard

## 2023-05-27 LAB — CULTURE, BLOOD (ROUTINE X 2)
Culture: NO GROWTH
Culture: NO GROWTH
Special Requests: ADEQUATE

## 2023-05-30 NOTE — Progress Notes (Signed)
In response to coding query for reason for lab test ordered.  Alcohol level ordered as there was report of altered mental status.

## 2023-08-01 ENCOUNTER — Emergency Department (HOSPITAL_COMMUNITY)
Admission: EM | Admit: 2023-08-01 | Discharge: 2023-08-01 | Disposition: A | Discharge status: Home / Self Care | Payer: 59 | Source: Home / Self Care | Attending: Emergency Medicine | Admitting: Emergency Medicine

## 2023-08-01 ENCOUNTER — Emergency Department (HOSPITAL_COMMUNITY): Payer: 59

## 2023-08-01 ENCOUNTER — Other Ambulatory Visit: Payer: Self-pay

## 2023-08-01 ENCOUNTER — Encounter (HOSPITAL_COMMUNITY): Payer: Self-pay

## 2023-08-01 DIAGNOSIS — I120 Hypertensive chronic kidney disease with stage 5 chronic kidney disease or end stage renal disease: Secondary | ICD-10-CM | POA: Insufficient documentation

## 2023-08-01 DIAGNOSIS — Z992 Dependence on renal dialysis: Secondary | ICD-10-CM | POA: Insufficient documentation

## 2023-08-01 DIAGNOSIS — R112 Nausea with vomiting, unspecified: Secondary | ICD-10-CM | POA: Insufficient documentation

## 2023-08-01 DIAGNOSIS — N186 End stage renal disease: Secondary | ICD-10-CM | POA: Insufficient documentation

## 2023-08-01 DIAGNOSIS — E1122 Type 2 diabetes mellitus with diabetic chronic kidney disease: Secondary | ICD-10-CM | POA: Insufficient documentation

## 2023-08-01 DIAGNOSIS — R1013 Epigastric pain: Secondary | ICD-10-CM | POA: Insufficient documentation

## 2023-08-01 DIAGNOSIS — R0902 Hypoxemia: Secondary | ICD-10-CM | POA: Diagnosis not present

## 2023-08-01 DIAGNOSIS — K226 Gastro-esophageal laceration-hemorrhage syndrome: Secondary | ICD-10-CM | POA: Diagnosis not present

## 2023-08-01 LAB — CBC WITH DIFFERENTIAL/PLATELET
Abs Immature Granulocytes: 0.03 10*3/uL (ref 0.00–0.07)
Basophils Absolute: 0 10*3/uL (ref 0.0–0.1)
Basophils Relative: 0 %
Eosinophils Absolute: 0 10*3/uL (ref 0.0–0.5)
Eosinophils Relative: 0 %
HCT: 30.7 % — ABNORMAL LOW (ref 39.0–52.0)
Hemoglobin: 9.5 g/dL — ABNORMAL LOW (ref 13.0–17.0)
Immature Granulocytes: 0 %
Lymphocytes Relative: 4 %
Lymphs Abs: 0.4 10*3/uL — ABNORMAL LOW (ref 0.7–4.0)
MCH: 26.5 pg (ref 26.0–34.0)
MCHC: 30.9 g/dL (ref 30.0–36.0)
MCV: 85.8 fL (ref 80.0–100.0)
Monocytes Absolute: 0.4 10*3/uL (ref 0.1–1.0)
Monocytes Relative: 4 %
Neutro Abs: 8.5 10*3/uL — ABNORMAL HIGH (ref 1.7–7.7)
Neutrophils Relative %: 92 %
Platelets: 251 10*3/uL (ref 150–400)
RBC: 3.58 MIL/uL — ABNORMAL LOW (ref 4.22–5.81)
RDW: 18.4 % — ABNORMAL HIGH (ref 11.5–15.5)
WBC: 9.4 10*3/uL (ref 4.0–10.5)
nRBC: 0 % (ref 0.0–0.2)

## 2023-08-01 LAB — COMPREHENSIVE METABOLIC PANEL
ALT: 10 U/L (ref 0–44)
AST: 12 U/L — ABNORMAL LOW (ref 15–41)
Albumin: 3.9 g/dL (ref 3.5–5.0)
Alkaline Phosphatase: 48 U/L (ref 38–126)
Anion gap: 20 — ABNORMAL HIGH (ref 5–15)
BUN: 75 mg/dL — ABNORMAL HIGH (ref 6–20)
CO2: 21 mmol/L — ABNORMAL LOW (ref 22–32)
Calcium: 7.5 mg/dL — ABNORMAL LOW (ref 8.9–10.3)
Chloride: 96 mmol/L — ABNORMAL LOW (ref 98–111)
Creatinine, Ser: 16.93 mg/dL — ABNORMAL HIGH (ref 0.61–1.24)
GFR, Estimated: 3 mL/min — ABNORMAL LOW (ref >60)
Glucose, Bld: 134 mg/dL — ABNORMAL HIGH (ref 70–99)
Potassium: 4.8 mmol/L (ref 3.5–5.1)
Sodium: 137 mmol/L (ref 135–145)
Total Bilirubin: 1.1 mg/dL (ref 0.0–1.2)
Total Protein: 7.7 g/dL (ref 6.5–8.1)

## 2023-08-01 LAB — I-STAT CG4 LACTIC ACID, ED
Lactic Acid, Venous: 1.8 mmol/L (ref 0.5–1.9)
Lactic Acid, Venous: 2.1 mmol/L (ref 0.5–1.9)

## 2023-08-01 LAB — MAGNESIUM: Magnesium: 2.4 mg/dL (ref 1.7–2.4)

## 2023-08-01 LAB — LIPASE, BLOOD: Lipase: 24 U/L (ref 11–51)

## 2023-08-01 LAB — HEMOGLOBIN A1C
Hgb A1c MFr Bld: 6 % — ABNORMAL HIGH (ref 4.8–5.6)
Mean Plasma Glucose: 125.5 mg/dL

## 2023-08-01 MED ORDER — DROPERIDOL 2.5 MG/ML IJ SOLN
1.2500 mg | Freq: Once | INTRAMUSCULAR | Status: AC
  Administered 2023-08-01: 1.25 mg via INTRAVENOUS
  Filled 2023-08-01: qty 2

## 2023-08-01 MED ORDER — METOCLOPRAMIDE HCL 10 MG PO TABS: 10.0000 mg | ORAL_TABLET | Freq: Four times a day (QID) | ORAL | 0 refills | Status: DC

## 2023-08-01 MED ORDER — ACETAMINOPHEN 500 MG PO TABS
1000.0000 mg | ORAL_TABLET | Freq: Once | ORAL | Status: AC
Start: 2023-08-01 — End: 2023-08-01
  Administered 2023-08-01: 1000 mg via ORAL
  Filled 2023-08-01: qty 2

## 2023-08-01 MED ORDER — CARVEDILOL 12.5 MG PO TABS
25.0000 mg | ORAL_TABLET | Freq: Two times a day (BID) | ORAL | Status: DC
  Administered 2023-08-01: 25 mg via ORAL
  Filled 2023-08-01: qty 2

## 2023-08-01 MED ORDER — FENTANYL CITRATE PF 50 MCG/ML IJ SOSY
50.0000 ug | PREFILLED_SYRINGE | Freq: Once | INTRAMUSCULAR | Status: AC
  Administered 2023-08-01: 50 ug via INTRAVENOUS
  Filled 2023-08-01: qty 1

## 2023-08-01 MED ORDER — INSULIN ASPART 100 UNIT/ML IJ SOLN
0.0000 [IU] | Freq: Three times a day (TID) | INTRAMUSCULAR | Status: DC
Start: 2023-08-02 — End: 2023-08-02

## 2023-08-01 MED ORDER — PANTOPRAZOLE SODIUM 40 MG IV SOLR
80.0000 mg | Freq: Once | INTRAVENOUS | Status: AC
Start: 2023-08-01 — End: 2023-08-01
  Administered 2023-08-01: 80 mg via INTRAVENOUS
  Filled 2023-08-01: qty 20

## 2023-08-01 MED ORDER — LACTATED RINGERS IV BOLUS
1000.0000 mL | Freq: Once | INTRAVENOUS | Status: AC
  Administered 2023-08-01: 1000 mL via INTRAVENOUS

## 2023-08-01 MED ORDER — CLONIDINE HCL 0.1 MG PO TABS
0.1000 mg | ORAL_TABLET | Freq: Once | ORAL | Status: AC
  Administered 2023-08-01: 0.1 mg via ORAL
  Filled 2023-08-01: qty 1

## 2023-08-01 NOTE — ED Notes (Addendum)
Delay in EKG due to pt pain level.

## 2023-08-01 NOTE — ED Provider Notes (Signed)
[place] EMERGENCY DEPARTMENT AT [facility] Provider Note  MDM   HPI/ROS:  [name] is a 38 y.o. 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 Patient reports onset of emesis since this morning with subsequent development of stabbing epigastric abdominal pain.  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  He denies any associated fevers, chills, chest pain, dyspnea, diarrhea, constipation.  He does make a very small amount of urine and has not experienced any recent dysuria.  He has never experienced similar symptoms in the past.  He denies any tobacco use, alcohol, marijuana or other substance use.  He reports receiving full session of dialysis 2 days ago.  Physical exam is notable for: - Frequent retching -- diffuse abdominal pain most severe in epigastric region -- no rebound or guarding -- No flank or CVA tenderness  On my initial evaluation, patient is:  - Patient afebrile.  Hypertensive and mildly tachycardic, though actively retching while obtaining vital signs.  No signs of peritonitis on exam.  -Additional history obtained from patient's partner  Based on patient's presentation, including their history and exam, differential diagnosis includes pancreatitis, gastritis, peptic ulcer disease/ruptured ulcer, bowel obstruction, UTI/pyelonephritis, nephrolithiasis, cyclic vomiting syndrome, cannabinoid hyperemesis syndrome, less likely diverticulitis.  Additionally considered possible mesenteric ischemia given history of MSSA endocarditis, however this is felt much less likely.  Patient with no improvement following Zofran that he received EMS.  Subsequently given droperidol and fentanyl with improvement of  symptoms.  He was additionally given dose of IV Protonix given concern for possible ruptured ulcer with some blood-streaked emesis, though suspect more likely due to Mallory-Weiss tear.  Plan to obtain labs, EKG, chest x-ray, CT abdomen/pelvis without contrast.  Will additionally attempt to obtain UA if patient does provide urine sample, though per report makes minimal urine.    Interpretations, interventions, and the patient's course of care are documented below.   -EKG with sinus tachycardia, normal QTc.  He does have some peaked T waves in the precordial leads, however this appears stable from prior -Labs with elevated creatinine and BUN with associated elevated anion gap and mild acidosis, however no significant electrolyte derangements.  Hemoglobin stable from prior.  No leukocytosis.  Normal initial lactic acid. -Chest x-ray with mild pulmonary congestion, no free air -CT with nonspecific bladder wall thickening, no acute pathology -Patient confirmed next HD session tomorrow 1/21 at 8 AM -Patient reevaluated multiple times and observed to be sleeping comfortably, however after waking up report of recurrence of pain and subsequently observed to begin retching again. -Ultimately engaged in shared decision-making with patient regarding overall reassuring workup and very close follow-up.  Advised patient given no acute findings on imaging no further IV narcotics indicated at this time, at which point patient requested discharge -He was counseled on importance of maintaining scheduled dialysis appointment in the morning and additionally sent prescription to patient's pharmacy of choice for antiemetic  Disposition:  I discussed the plan for discharge with the patient and/or their surrogate at bedside prior to discharge and they were in agreement with the plan and verbalized understanding of the return precautions provided. All questions answered to the best of my ability. Ultimately, the patient was  discharged in stable condition with stable vital  signs. I am reassured that they are capable of close follow up and good social support at home.   Clinical Impression: No diagnosis found.  Rx / DC Orders ED Discharge Orders     None       The plan for this patient was discussed with Dr. Doran Durand, who voiced agreement and who oversaw evaluation and treatment of this patient.   Clinical Complexity A medically appropriate history, review of systems, and physical exam was performed.  My independent interpretations of EKG, labs, and radiology are documented in the ED course above.   If decision rules were used in this patient's evaluation, they are listed below.   Patient's presentation is most consistent with acute presentation with potential threat to life or bodily function.  Medical Decision Making Amount and/or Complexity of Data Reviewed Labs: ordered. Radiology: ordered.  Risk OTC drugs. Prescription drug management.    HPI/ROS      See MDM section for pertinent HPI and ROS. A complete ROS was performed with pertinent positives/negatives noted above.   Past Medical History:  Diagnosis Date   ADHD (attention deficit hyperactivity disorder)    Diabetes mellitus    ESRD on hemodialysis (HCC)    [facility] [facility]   High cholesterol    Hypertension    Hypertrophic cardiomyopathy (HCC) 02/26/2021   Seizure (HCC) 02/08/2021    Past Surgical History:  Procedure Laterality Date   AMPUTATION TOE Right 12/29/2021   Procedure: AMPUTATION TOE, fifth;  Surgeon: Louann Sjogren, DPM;  Location: [facility] OR;  Service: Podiatry;  Laterality: Right;  surgical team will do local block   AV FISTULA PLACEMENT Left 08/11/2020   Procedure: LEFT UPPER EXTREMITY ARTERIOVENOUS (AV) FISTULA CREATION;  Surgeon: Leonie Douglas, MD;  Location: [facility] OR;  Service: Vascular;  Laterality: Left;   FISTULA SUPERFICIALIZATION Left 12/02/2021   Procedure: PLICATION OF LEFT RADIUS CEPHALIC FISTULA;   Surgeon: Leonie Douglas, MD;  Location: [facility] OR;  Service: Vascular;  Laterality: Left;  PERIPHERAL NERVE BLOCK   FRACTURE SURGERY     I & D EXTREMITY Left 07/16/2014   Procedure: IRRIGATION AND DEBRIDEMENT EXTREMITY/LEFT INDEX FINGER;  Surgeon: Betha Loa, MD;  Location: [facility] OR;  Service: Orthopedics;  Laterality: Left;   IR FLUORO GUIDE CV LINE RIGHT  10/04/2022   IR FLUORO GUIDE CV LINE RIGHT  10/05/2022   IR PERC TUN PERIT CATH WO PORT S&I /IMAG  08/07/2020   IR REMOVAL TUN CV CATH W/O FL  10/27/2022   IR THROMBECTOMY AV FISTULA W/THROMBOLYSIS/PTA INC/SHUNT/IMG LEFT Left 10/05/2022   IR US GUIDE VASC ACCESS RIGHT  08/07/2020   IR US GUIDE VASC ACCESS RIGHT  10/04/2022   IR US GUIDE VASC ACCESS RIGHT  10/05/2022   TEE WITHOUT CARDIOVERSION N/A 10/28/2022   Procedure: TRANSESOPHAGEAL ECHOCARDIOGRAM;  Surgeon: Thomasene Ripple, DO;  Location: [facility] INVASIVE CV LAB;  Service: Cardiovascular;  Laterality: N/A;      Physical Exam   Vitals:   08/01/23 1653 08/01/23 1704  BP:  (!) 195/133  Pulse:  (!) 105  Resp:  (!) 25  Temp:  98.5 F (36.9 C)  TempSrc:  Axillary  SpO2:  100%  Weight: 90.7 kg   Height: 6' (1.829 m)     Physical Exam Gen: Appears uncomfortable but in no acute distress.  Actively retching.  Blood-streaked vomitus in emesis bag HENT: Conjunctiva clear, PERRL, EOMI. dry mucous membranes.  CV: RRR. No M/R/G Pulm: Lungs CTAB with no wheezing, rales, or rhonchi.  GI:  Abdomen soft, diffuse mild tenderness to palpation most severe in the epigastric region, non-distended.  No rebound or guarding normal bowel sounds in all 4 quadrants.  No CVA tenderness bilaterally MSK/Skin: No lower extremity edema. Extremities warm, well-perfused with 2+ pulses in all 4 extremities.  Fistula in left upper extremity Neuro: A&Ox3. GCS 15. Moves all extremities.     Procedures   If procedures were preformed on this patient, they are listed below:  Procedures   Mikeal Hawthorne, MD Emergency  Medicine PGY-2   Please note that this documentation was produced with the assistance of voice-to-text technology and may contain errors.    Mikeal Hawthorne, MD 08/02/23 8295    Glyn Ade, MD 08/02/23 1500

## 2023-08-01 NOTE — ED Triage Notes (Signed)
Pt BIB [facility] after [facility] abdominal pain started this morning. Pt is due for dialysis tomorrow and has missed no treatments. Pt vomiting in triage. Pt has never had this pain before. Denies ETOH use, SHOB, chest pain. Hx of vision impairment.

## 2023-08-01 NOTE — Discharge Instructions (Addendum)
He was seen in the emergency department for abdominal pain and vomiting.  Test performed while you are here included EKG, blood work, chest x-ray and abdominal CT.  These tests were reassuring against any emergent or life-threatening causes of your symptoms.  Your blood work did not show some electrolyte abnormalities so it is very important that you go to your dialysis session tomorrow morning as scheduled. We are sending a prescription for antinausea medication to your pharmacy of choice. If you experience significantly increased pain, inability keep food or drink down after dialysis, loss of consciousness, or any other concerns, return to the ED for reevaluation.

## 2023-08-02 ENCOUNTER — Other Ambulatory Visit: Payer: Self-pay

## 2023-08-02 ENCOUNTER — Emergency Department (HOSPITAL_COMMUNITY): Payer: 59

## 2023-08-02 ENCOUNTER — Encounter (HOSPITAL_COMMUNITY): Payer: Self-pay

## 2023-08-02 ENCOUNTER — Inpatient Hospital Stay (HOSPITAL_COMMUNITY)
Admission: EM | Admit: 2023-08-02 | Discharge: 2023-08-06 | DRG: 368 | Disposition: A | Discharge status: Home / Self Care | Payer: 59 | Attending: Internal Medicine | Admitting: Internal Medicine

## 2023-08-02 DIAGNOSIS — T4275XA Adverse effect of unspecified antiepileptic and sedative-hypnotic drugs, initial encounter: Secondary | ICD-10-CM | POA: Diagnosis not present

## 2023-08-02 DIAGNOSIS — R1013 Epigastric pain: Secondary | ICD-10-CM | POA: Diagnosis not present

## 2023-08-02 DIAGNOSIS — I422 Other hypertrophic cardiomyopathy: Secondary | ICD-10-CM | POA: Diagnosis present

## 2023-08-02 DIAGNOSIS — D631 Anemia in chronic kidney disease: Secondary | ICD-10-CM | POA: Diagnosis present

## 2023-08-02 DIAGNOSIS — N186 End stage renal disease: Secondary | ICD-10-CM | POA: Diagnosis present

## 2023-08-02 DIAGNOSIS — K209 Esophagitis, unspecified without bleeding: Secondary | ICD-10-CM

## 2023-08-02 DIAGNOSIS — K920 Hematemesis: Secondary | ICD-10-CM | POA: Diagnosis not present

## 2023-08-02 DIAGNOSIS — Z89421 Acquired absence of other right toe(s): Secondary | ICD-10-CM

## 2023-08-02 DIAGNOSIS — K2900 Acute gastritis without bleeding: Secondary | ICD-10-CM | POA: Diagnosis present

## 2023-08-02 DIAGNOSIS — R0902 Hypoxemia: Secondary | ICD-10-CM | POA: Diagnosis present

## 2023-08-02 DIAGNOSIS — E7800 Pure hypercholesterolemia, unspecified: Secondary | ICD-10-CM | POA: Diagnosis present

## 2023-08-02 DIAGNOSIS — Z8249 Family history of ischemic heart disease and other diseases of the circulatory system: Secondary | ICD-10-CM

## 2023-08-02 DIAGNOSIS — N2581 Secondary hyperparathyroidism of renal origin: Secondary | ICD-10-CM | POA: Diagnosis present

## 2023-08-02 DIAGNOSIS — E1122 Type 2 diabetes mellitus with diabetic chronic kidney disease: Secondary | ICD-10-CM | POA: Diagnosis present

## 2023-08-02 DIAGNOSIS — F419 Anxiety disorder, unspecified: Secondary | ICD-10-CM | POA: Diagnosis present

## 2023-08-02 DIAGNOSIS — H547 Unspecified visual loss: Secondary | ICD-10-CM | POA: Diagnosis present

## 2023-08-02 DIAGNOSIS — F909 Attention-deficit hyperactivity disorder, unspecified type: Secondary | ICD-10-CM | POA: Diagnosis present

## 2023-08-02 DIAGNOSIS — D649 Anemia, unspecified: Secondary | ICD-10-CM | POA: Diagnosis not present

## 2023-08-02 DIAGNOSIS — M898X9 Other specified disorders of bone, unspecified site: Secondary | ICD-10-CM | POA: Diagnosis present

## 2023-08-02 DIAGNOSIS — I5032 Chronic diastolic (congestive) heart failure: Secondary | ICD-10-CM | POA: Diagnosis present

## 2023-08-02 DIAGNOSIS — K2990 Gastroduodenitis, unspecified, without bleeding: Secondary | ICD-10-CM | POA: Diagnosis present

## 2023-08-02 DIAGNOSIS — Z794 Long term (current) use of insulin: Secondary | ICD-10-CM | POA: Diagnosis not present

## 2023-08-02 DIAGNOSIS — K317 Polyp of stomach and duodenum: Secondary | ICD-10-CM | POA: Diagnosis present

## 2023-08-02 DIAGNOSIS — Z79899 Other long term (current) drug therapy: Secondary | ICD-10-CM | POA: Diagnosis not present

## 2023-08-02 DIAGNOSIS — J9601 Acute respiratory failure with hypoxia: Secondary | ICD-10-CM | POA: Diagnosis present

## 2023-08-02 DIAGNOSIS — I5033 Acute on chronic diastolic (congestive) heart failure: Secondary | ICD-10-CM | POA: Diagnosis present

## 2023-08-02 DIAGNOSIS — I160 Hypertensive urgency: Secondary | ICD-10-CM | POA: Diagnosis present

## 2023-08-02 DIAGNOSIS — K2970 Gastritis, unspecified, without bleeding: Secondary | ICD-10-CM

## 2023-08-02 DIAGNOSIS — Z833 Family history of diabetes mellitus: Secondary | ICD-10-CM

## 2023-08-02 DIAGNOSIS — R1110 Vomiting, unspecified: Secondary | ICD-10-CM | POA: Diagnosis present

## 2023-08-02 DIAGNOSIS — E109 Type 1 diabetes mellitus without complications: Secondary | ICD-10-CM | POA: Diagnosis present

## 2023-08-02 DIAGNOSIS — Z992 Dependence on renal dialysis: Secondary | ICD-10-CM

## 2023-08-02 DIAGNOSIS — Z8619 Personal history of other infectious and parasitic diseases: Secondary | ICD-10-CM

## 2023-08-02 DIAGNOSIS — K226 Gastro-esophageal laceration-hemorrhage syndrome: Principal | ICD-10-CM

## 2023-08-02 DIAGNOSIS — R112 Nausea with vomiting, unspecified: Secondary | ICD-10-CM | POA: Diagnosis not present

## 2023-08-02 DIAGNOSIS — I132 Hypertensive heart and chronic kidney disease with heart failure and with stage 5 chronic kidney disease, or end stage renal disease: Secondary | ICD-10-CM | POA: Diagnosis present

## 2023-08-02 DIAGNOSIS — Z888 Allergy status to other drugs, medicaments and biological substances status: Secondary | ICD-10-CM

## 2023-08-02 LAB — CBC WITH DIFFERENTIAL/PLATELET
Abs Immature Granulocytes: 0.05 10*3/uL (ref 0.00–0.07)
Basophils Absolute: 0 10*3/uL (ref 0.0–0.1)
Basophils Relative: 0 %
Eosinophils Absolute: 0 10*3/uL (ref 0.0–0.5)
Eosinophils Relative: 0 %
HCT: 28.8 % — ABNORMAL LOW (ref 39.0–52.0)
Hemoglobin: 9.3 g/dL — ABNORMAL LOW (ref 13.0–17.0)
Immature Granulocytes: 0 %
Lymphocytes Relative: 4 %
Lymphs Abs: 0.4 10*3/uL — ABNORMAL LOW (ref 0.7–4.0)
MCH: 27.4 pg (ref 26.0–34.0)
MCHC: 32.3 g/dL (ref 30.0–36.0)
MCV: 85 fL (ref 80.0–100.0)
Monocytes Absolute: 0.5 10*3/uL (ref 0.1–1.0)
Monocytes Relative: 4 %
Neutro Abs: 10.6 10*3/uL — ABNORMAL HIGH (ref 1.7–7.7)
Neutrophils Relative %: 92 %
Platelets: 262 10*3/uL (ref 150–400)
RBC: 3.39 MIL/uL — ABNORMAL LOW (ref 4.22–5.81)
RDW: 18.7 % — ABNORMAL HIGH (ref 11.5–15.5)
WBC: 11.6 10*3/uL — ABNORMAL HIGH (ref 4.0–10.5)
nRBC: 0 % (ref 0.0–0.2)

## 2023-08-02 LAB — COMPREHENSIVE METABOLIC PANEL WITH GFR
ALT: 19 U/L (ref 0–44)
AST: 18 U/L (ref 15–41)
Albumin: 4.4 g/dL (ref 3.5–5.0)
Alkaline Phosphatase: 56 U/L (ref 38–126)
Anion gap: 27 — ABNORMAL HIGH (ref 5–15)
BUN: 89 mg/dL — ABNORMAL HIGH (ref 6–20)
CO2: 18 mmol/L — ABNORMAL LOW (ref 22–32)
Calcium: 7.9 mg/dL — ABNORMAL LOW (ref 8.9–10.3)
Chloride: 94 mmol/L — ABNORMAL LOW (ref 98–111)
Creatinine, Ser: 17.99 mg/dL — ABNORMAL HIGH (ref 0.61–1.24)
GFR, Estimated: 3 mL/min — ABNORMAL LOW
Glucose, Bld: 114 mg/dL — ABNORMAL HIGH (ref 70–99)
Potassium: 5.1 mmol/L (ref 3.5–5.1)
Sodium: 139 mmol/L (ref 135–145)
Total Bilirubin: 1.3 mg/dL — ABNORMAL HIGH (ref 0.0–1.2)
Total Protein: 8.7 g/dL — ABNORMAL HIGH (ref 6.5–8.1)

## 2023-08-02 LAB — MAGNESIUM: Magnesium: 2.5 mg/dL — ABNORMAL HIGH (ref 1.7–2.4)

## 2023-08-02 LAB — BETA-HYDROXYBUTYRIC ACID: Beta-Hydroxybutyric Acid: 1.91 mmol/L — ABNORMAL HIGH (ref 0.05–0.27)

## 2023-08-02 LAB — HEMOGLOBIN AND HEMATOCRIT, BLOOD
HCT: 28.6 % — ABNORMAL LOW (ref 39.0–52.0)
Hemoglobin: 9.2 g/dL — ABNORMAL LOW (ref 13.0–17.0)

## 2023-08-02 LAB — BRAIN NATRIURETIC PEPTIDE: B Natriuretic Peptide: 3974 pg/mL — ABNORMAL HIGH (ref 0.0–100.0)

## 2023-08-02 LAB — GLUCOSE, CAPILLARY: Glucose-Capillary: 137 mg/dL — ABNORMAL HIGH (ref 70–99)

## 2023-08-02 MED ORDER — INSULIN ASPART 100 UNIT/ML IJ SOLN
0.0000 [IU] | INTRAMUSCULAR | Status: DC
  Administered 2023-08-03 – 2023-08-06 (×7): 1 [IU] via SUBCUTANEOUS

## 2023-08-02 MED ORDER — HYDROMORPHONE HCL 1 MG/ML IJ SOLN
0.5000 mg | Freq: Once | INTRAMUSCULAR | Status: AC
  Administered 2023-08-02: 0.5 mg via INTRAVENOUS
  Filled 2023-08-02: qty 0.5

## 2023-08-02 MED ORDER — CLONIDINE HCL 0.1 MG/24HR TD PTWK
0.1000 mg | MEDICATED_PATCH | TRANSDERMAL | Status: DC
  Administered 2023-08-02: 0.1 mg via TRANSDERMAL
  Filled 2023-08-02 (×2): qty 1

## 2023-08-02 MED ORDER — POLYETHYLENE GLYCOL 3350 17 G PO PACK: 17.0000 g | PACK | Freq: Every day | ORAL | Status: DC | PRN

## 2023-08-02 MED ORDER — NICARDIPINE HCL IN NACL 20-0.86 MG/200ML-% IV SOLN
3.0000 mg/h | INTRAVENOUS | Status: DC
  Administered 2023-08-02: 5 mg/h via INTRAVENOUS
  Administered 2023-08-03 – 2023-08-04 (×4): 3 mg/h via INTRAVENOUS
  Filled 2023-08-02 (×5): qty 200

## 2023-08-02 MED ORDER — ACETAMINOPHEN 325 MG PO TABS
650.0000 mg | ORAL_TABLET | Freq: Four times a day (QID) | ORAL | Status: DC | PRN
  Administered 2023-08-03 – 2023-08-06 (×6): 650 mg via ORAL
  Filled 2023-08-02 (×7): qty 2

## 2023-08-02 MED ORDER — LORAZEPAM 2 MG/ML IJ SOLN
0.5000 mg | Freq: Once | INTRAMUSCULAR | Status: AC
  Administered 2023-08-02: 0.5 mg via INTRAVENOUS
  Filled 2023-08-02: qty 1

## 2023-08-02 MED ORDER — LABETALOL HCL 5 MG/ML IV SOLN
10.0000 mg | Freq: Once | INTRAVENOUS | Status: AC
  Administered 2023-08-02: 10 mg via INTRAVENOUS
  Filled 2023-08-02: qty 4

## 2023-08-02 MED ORDER — LABETALOL HCL 5 MG/ML IV SOLN
20.0000 mg | Freq: Once | INTRAVENOUS | Status: AC
  Administered 2023-08-02: 20 mg via INTRAVENOUS
  Filled 2023-08-02: qty 4

## 2023-08-02 MED ORDER — ORAL CARE MOUTH RINSE: 15.0000 mL | OROMUCOSAL | Status: DC | PRN

## 2023-08-02 MED ORDER — METOCLOPRAMIDE HCL 5 MG/ML IJ SOLN
10.0000 mg | Freq: Once | INTRAMUSCULAR | Status: AC
Start: 2023-08-02 — End: 2023-08-02
  Administered 2023-08-02: 10 mg via INTRAVENOUS
  Filled 2023-08-02: qty 2

## 2023-08-02 MED ORDER — DROPERIDOL 2.5 MG/ML IJ SOLN: 1.2500 mg | Freq: Once | INTRAMUSCULAR | Status: DC

## 2023-08-02 MED ORDER — CHLORHEXIDINE GLUCONATE CLOTH 2 % EX PADS
6.0000 | MEDICATED_PAD | Freq: Every day | CUTANEOUS | Status: DC
  Administered 2023-08-02 – 2023-08-04 (×3): 6 via TOPICAL

## 2023-08-02 MED ORDER — PROMETHAZINE HCL 25 MG/ML IJ SOLN
INTRAMUSCULAR | Status: AC
  Filled 2023-08-02: qty 1

## 2023-08-02 MED ORDER — PANTOPRAZOLE SODIUM 40 MG IV SOLR
40.0000 mg | Freq: Two times a day (BID) | INTRAVENOUS | Status: DC
  Administered 2023-08-02 – 2023-08-06 (×7): 40 mg via INTRAVENOUS
  Filled 2023-08-02 (×7): qty 10

## 2023-08-02 MED ORDER — DROPERIDOL 2.5 MG/ML IJ SOLN
1.2500 mg | Freq: Once | INTRAMUSCULAR | Status: AC
  Administered 2023-08-02: 1.25 mg via INTRAVENOUS
  Filled 2023-08-02: qty 2

## 2023-08-02 MED ORDER — SODIUM CHLORIDE 0.9 % IV SOLN
12.5000 mg | Freq: Four times a day (QID) | INTRAVENOUS | Status: DC | PRN
  Administered 2023-08-02 – 2023-08-05 (×5): 12.5 mg via INTRAVENOUS
  Filled 2023-08-02 (×6): qty 0.5

## 2023-08-02 MED ORDER — ACETAMINOPHEN 650 MG RE SUPP: 650.0000 mg | Freq: Four times a day (QID) | RECTAL | Status: DC | PRN

## 2023-08-02 NOTE — ED Notes (Signed)
Pt o2 dropped to 80 after one dose of dilaudid. Pt placed on 4L. Sats now 94%.

## 2023-08-02 NOTE — Assessment & Plan Note (Signed)
Chest imaging showing pulmonary edema, last echo 10/2022, EF 55 to 60%. -Per HD

## 2023-08-02 NOTE — ED Triage Notes (Signed)
Pt BIB from dialysis. Pt received 10 minutes of dialysis and was given 4 mg zofran ODT. Pt continues to scream "it hurts" and put fingers down his throat to vomit and states "it's the only thing that makes me feel better." Pt states symptoms started on Sunday and have progressively gotten worse. Pt last received dialysis on Saturday 07/30/23.

## 2023-08-02 NOTE — ED Notes (Signed)
Hospitalist made aware of the patients elevated BP after medication intervention.

## 2023-08-02 NOTE — Assessment & Plan Note (Signed)
On dialysis Tuesday Thursday Saturday, reports he has not missed any HD session.  Was 10 minutes into his HD session today when he was sent to the ED.  Elevated BUN at 89.  Potassium 5.1.  Blood pressure elevated. - EDP talked to nephrology- Dr. Ronalee Belts, will dialyze in a.m.

## 2023-08-02 NOTE — Assessment & Plan Note (Addendum)
Coffee-ground emesis in the setting of intractable vomiting.  Consider Mallory-Weiss tear, peptic ulcer disease, gastritis -IV Protonix 40 twice daily -Trend hemoglobin -Clear liquid diet, n.p.o. midnight -If persistent emesis, consider GI eval

## 2023-08-02 NOTE — ED Notes (Signed)
Patient just had a black coffee ground emesis.

## 2023-08-02 NOTE — ED Notes (Signed)
Patient ambulated down FT hallway with Pulse ox and patient was steady at 87% and pulse was 108.

## 2023-08-02 NOTE — ED Provider Notes (Signed)
[facility] AT [facility] Provider Note  CSN: [id number] Arrival date & time: 08/02/23 1104  Chief Complaint(s) Abdominal Pain  HPI [name] is a 38 y.o. male history of hypertrophic cardiomyopathy, end-stage renal disease on hemodialysis, diabetes presenting to the emergency department with abdominal pain.  Patient reports epigastric and upper abdominal pain associated with nausea or vomiting.  He has been forcibly vomiting by putting his finger down his throat because this "helps".  Was seen outside ER yesterday for the same with reassuring workup.  He went to dialysis today, was only able to tolerate 10 minutes of dialysis before coming to the emergency department.  Reports symptoms are not significantly different from yesterday.  No fevers or chills.  No black stools.  No diarrhea.  No shortness of breath or chest pain.   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) 12/29/2021   Status post surgery 12/29/2021   Hypoglycemia 10/30/2021   ESRD (end stage renal disease) on dialysis (HCC) 03/12/2021   Hypertrophic cardiomyopathy (HCC) 02/26/2021   Syncope 02/08/2021   DKA (diabetic ketoacidosis) (HCC) 02/08/2021   Hyperglycemia    Diabetic foot ulcer (HCC) 11/24/2020   Hypervolemia associated with renal insufficiency 08/07/2020   Uremia 08/07/2020   Metabolic acidosis 08/07/2020   Nausea and  vomiting 05/07/2020   Dehydration    Hypertensive urgency 02/25/2020   Anemia 12/02/2019   Essential hypertension 12/02/2019   SOB (shortness of breath) 12/02/2019   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 Authorizing Provider  acetaminophen (TYLENOL) 325 MG tablet Take 2 tablets (650 mg total) by mouth every 6 (six) hours as needed for mild pain (or Fever >/= 101). 12/30/21   Rhetta Mura, MD  atorvastatin (LIPITOR) 40 MG tablet Take 1 tablet (40 mg total) by mouth daily. 01/18/23 09/17/23  Gloris Manchester, MD  calcitRIOL (ROCALTROL) 0.5 MCG capsule Take 2 capsules (1 mcg total) by mouth Every Tuesday,Thursday,and Saturday with dialysis. 12/31/21   Rhetta Mura, MD  carvedilol (COREG) 25 MG tablet Take 1 tablet (25 mg total) by mouth 2 (two) times daily with a meal. 01/18/23 09/17/23  Gloris Manchester, MD  cloNIDine (CATAPRES) 0.1 MG tablet Take 1 tablet (0.1 mg total) by mouth 2 (two) times daily. 01/18/23   Gloris Manchester, MD  diphenhydramine-acetaminophen (TYLENOL PM) 25-500 MG TABS tablet Take 2 tablets by mouth 2 (two) times daily as needed (pain, sleep).    [provider]  insulin glargine-yfgn (SEMGLEE) 100 UNIT/ML injection Inject 0.12 mLs (12 Units total) into the skin at bedtime. 01/18/23   Gloris Manchester, MD  insulin lispro (HUMALOG) 100 UNIT/ML injection Inject 0.01-0.07 mLs (1-7 Units total) into the skin See admin instructions. Per sliding scale  3 times daily with meals 151-200= 1 unit 201-250= 2 units 251-300= 3 units 301-350= 5 units 351-400= 7 units Greater than 400 call md takes only as needed if blood sugar is over 200 01/18/23   Gloris Manchester, MD  Insulin Pen Needle (PEN NEEDLES) 31G X 8 MM MISC UAD 07/09/22   Rema Fendt, NP  metoCLOPramide (REGLAN)  10 MG tablet Take 1 tablet (10 mg total) by mouth every 6 (six) hours for 3 days. 08/01/23 08/04/23  Mikeal Hawthorne, MD  Multiple Vitamins-Minerals (MULTIVITAMIN WITH MINERALS) tablet Take 1 tablet by mouth daily.    [provider]  sodium zirconium cyclosilicate (LOKELMA) 10 g PACK packet Take 10 g by mouth See admin instructions. Take 1 packet (10g) once daily on non-dialysis days (Sunday, Monday, Wednesday, Friday)    [provider]                                                                                                                                    Past Surgical History Past Surgical History:  Procedure Laterality Date   AMPUTATION TOE Right 12/29/2021   Procedure: AMPUTATION TOE, fifth;  Surgeon: Louann Sjogren, DPM;  Location: [facility] OR;  Service: Podiatry;  Laterality: Right;  surgical team will do local block   AV FISTULA PLACEMENT Left 08/11/2020   Procedure: LEFT UPPER EXTREMITY ARTERIOVENOUS (AV) FISTULA CREATION;  Surgeon: Leonie Douglas, MD;  Location: [facility] OR;  Service: Vascular;  Laterality: Left;   FISTULA SUPERFICIALIZATION Left 12/02/2021   Procedure: PLICATION OF LEFT RADIUS CEPHALIC FISTULA;  Surgeon: Leonie Douglas, MD;  Location: [facility] OR;  Service: Vascular;  Laterality: Left;  PERIPHERAL NERVE BLOCK   FRACTURE SURGERY     I & D EXTREMITY Left 07/16/2014   Procedure: IRRIGATION AND DEBRIDEMENT EXTREMITY/LEFT INDEX FINGER;  Surgeon: Betha Loa, MD;  Location: [facility] OR;  Service: Orthopedics;  Laterality: Left;   IR FLUORO GUIDE CV LINE RIGHT  10/04/2022   IR FLUORO GUIDE CV LINE RIGHT  10/05/2022   IR PERC TUN PERIT CATH WO PORT S&I /IMAG  08/07/2020   IR REMOVAL TUN CV CATH W/O FL  10/27/2022   IR THROMBECTOMY AV FISTULA W/THROMBOLYSIS/PTA INC/SHUNT/IMG LEFT Left 10/05/2022   IR US GUIDE VASC ACCESS RIGHT  08/07/2020   IR US GUIDE VASC ACCESS RIGHT  10/04/2022   IR US GUIDE VASC ACCESS RIGHT  10/05/2022   TEE WITHOUT CARDIOVERSION N/A 10/28/2022    Procedure: TRANSESOPHAGEAL ECHOCARDIOGRAM;  Surgeon: Thomasene Ripple, DO;  Location: [facility] INVASIVE CV LAB;  Service: Cardiovascular;  Laterality: N/A;   Family History Family History  Problem Relation Age of Onset   Cancer Mother    Stroke Father    Diabetes Father    Hypertension Father     Social History Social History   Tobacco Use   Smoking status: Never    Passive exposure: Never   Smokeless tobacco: Never  Vaping  Use   Vaping status: Never Used  Substance Use Topics   Alcohol use: Not Currently    Comment: social drinker   Drug use: No   Allergies Apresoline [hydralazine] and Norvasc [amlodipine]  Review of Systems Review of Systems  All other systems reviewed and are negative.   Physical Exam Vital Signs  I have reviewed the triage vital signs BP (!) 186/119   Pulse (!) 107   Temp 98.2 F (36.8 C) (Oral)   Resp (!) 24   Ht 6' (1.829 m)   Wt 90.7 kg   SpO2 100%   BMI 27.12 kg/m  Physical Exam Vitals and nursing note reviewed.  Constitutional:      General: He is in acute distress.     Appearance: Normal appearance.     Comments: Frequently vomiting, putting his finger down his throat to force vomiting  HENT:     Mouth/Throat:     Mouth: Mucous membranes are moist.  Eyes:     Conjunctiva/sclera: Conjunctivae normal.  Cardiovascular:     Rate and Rhythm: Normal rate and regular rhythm.  Pulmonary:     Effort: Pulmonary effort is normal. No respiratory distress.     Breath sounds: Normal breath sounds.  Abdominal:     General: Abdomen is flat.     Palpations: Abdomen is soft.     Tenderness: There is no abdominal tenderness.  Musculoskeletal:     Right lower leg: No edema.     Left lower leg: No edema.  Skin:    General: Skin is warm and dry.     Capillary Refill: Capillary refill takes less than 2 seconds.  Neurological:     Mental Status: He is alert and oriented to person, place, and time. Mental status is at baseline.  Psychiatric:         Mood and Affect: Mood normal.        Behavior: Behavior normal.     ED Results and Treatments Labs (all labs ordered are listed, but only abnormal results are displayed) Labs Reviewed  COMPREHENSIVE METABOLIC PANEL - Abnormal; Notable for the following components:      Result Value   Chloride 94 (*)    CO2 18 (*)    Glucose, Bld 114 (*)    BUN 89 (*)    Creatinine, Ser 17.99 (*)    Calcium 7.9 (*)    Total Protein 8.7 (*)    Total Bilirubin 1.3 (*)    GFR, Estimated 3 (*)    Anion gap 27 (*)    All other components within normal limits  CBC WITH DIFFERENTIAL/PLATELET - Abnormal; Notable for the following components:   WBC 11.6 (*)    RBC 3.39 (*)    Hemoglobin 9.3 (*)    HCT 28.8 (*)    RDW 18.7 (*)    Neutro Abs 10.6 (*)    Lymphs Abs 0.4 (*)    All other components within normal limits  Radiology CT ABDOMEN PELVIS WO CONTRAST Result Date: 08/02/2023 CLINICAL DATA:  Abdominal pain, acute, nonlocalized EXAM: CT ABDOMEN AND PELVIS WITHOUT CONTRAST TECHNIQUE: Multidetector CT imaging of the abdomen and pelvis was performed following the standard protocol without IV contrast. RADIATION DOSE REDUCTION: This exam was performed according to the departmental dose-optimization program which includes automated exposure control, adjustment of the mA and/or kV according to patient size and/or use of iterative reconstruction technique. COMPARISON:  CT abdomen/pelvis dated August 01, 2023. FINDINGS: Lower chest: Interlobular septal thickening and increased ground-glass opacities in the bilateral lower lungs, most compatible with pulmonary edema. Cardiomegaly. Hepatobiliary: No suspicious focal hepatic lesion identified, within the limits of an unenhanced exam. Gallbladder is unremarkable. No biliary dilatation. Pancreas: Unremarkable. No pancreatic ductal dilatation or  surrounding inflammatory changes. Spleen: Normal in size without focal abnormality. Adrenals/Urinary Tract: Adrenal glands are unremarkable. 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 No hydronephrosis. Bladder is unremarkable given the degree of distention. Stomach/Bowel: Stomach is collapsed limiting detailed evaluation. Appendix appears normal. No evidence of bowel wall thickening, distention, or inflammatory changes. Vascular/Lymphatic: The abdominal aorta is normal in caliber. Branch vascular calcifications. No enlarged abdominal or pelvic lymph nodes. Reproductive: Prostate is unremarkable. Other: No abdominopelvic ascites or intraperitoneal free air. No abdominal wall hernia. Musculoskeletal: No acute or significant osseous findings. IMPRESSION: 1. No acute localizing findings in the abdomen or pelvis. 2. 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 No hydronephrosis. 3. Cardiomegaly with pulmonary edema in the lower lungs, slightly increased compared to the prior exam. Electronically Signed   By: Hart Robinsons M.D.   On: 08/02/2023 15:03   DG Abdomen Acute W/Chest Result Date: 08/02/2023 CLINICAL DATA:  Abdominal pain EXAM: DG ABDOMEN ACUTE WITH 1 VIEW CHEST COMPARISON:  Chest x-ray 08/01/2023.  CT 08/01/2023. FINDINGS: Enlarged cardiopericardial silhouette with prominence of the vasculature. Question component of interstitial edema. No pneumothorax or effusion. No consolidation. Overlapping cardiac leads. Overall only minimal bowel gas identified. There is some air in the nondilated stomach. No obvious dilated air-filled loops of bowel. Prominent vascular calcifications. Overlapping cardiac leads. No obvious free air beneath the diaphragm on the upright view. Abdominal images are under penetrated IMPRESSION: Enlarged heart with vascular congestion and question trace edema Minimal bowel gas,  nonspecific. No obvious free air dilatation of bowel. Under penetrated abdominal radiographs Electronically Signed   By: Karen Kays M.D.   On: 08/02/2023 12:53   CT ABDOMEN PELVIS WO CONTRAST Result Date: 08/01/2023 CLINICAL DATA:  Left lower quadrant abdominal pain EXAM: CT ABDOMEN AND PELVIS WITHOUT CONTRAST TECHNIQUE: Multidetector CT imaging of the abdomen and pelvis was performed following the standard protocol without IV contrast. RADIATION DOSE REDUCTION: This exam was performed according to the departmental dose-optimization program which includes automated exposure control, adjustment of the mA and/or kV according to patient size and/or use of iterative reconstruction technique. COMPARISON:  01/18/2023 FINDINGS: Lower chest: Interlobular septal thickening and ground-glass opacities in the lower lungs compatible with pulmonary edema. Cardiomegaly. Hepatobiliary: Unremarkable noncontrast appearance of the liver, gallbladder, and biliary tree. Pancreas: Unremarkable. Spleen: Unremarkable. Adrenals/Urinary Tract: Normal adrenal glands. Horseshoe kidney. Nonobstructing punctate calyceal stones in the left moiety. No hydronephrosis. Nondistended thick-walled bladder. Stomach/Bowel: No bowel obstruction or bowel wall thickening. Stomach and appendix are within normal limits. Vascular/Lymphatic: Aortic atherosclerosis. No enlarged abdominal or pelvic lymph nodes. Reproductive: Unremarkable. Other: Vascular congestion in the mesentery. No free intraperitoneal fluid or air. Musculoskeletal: No acute fracture. IMPRESSION:  1. Bladder wall thickening may be due to underdistention or cystitis. 2. 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 3. Cardiomegaly with pulmonary edema in the lower lungs. Aortic Atherosclerosis (ICD10-I70.0). Electronically Signed   By: Minerva Fester M.D.   On: 08/01/2023 20:26   DG Chest 1 View Result Date: 08/01/2023 CLINICAL DATA:  Chest pain  EXAM: CHEST  1 VIEW COMPARISON:  05/22/2023 FINDINGS: Cardiomegaly with mild central congestion. No focal airspace disease, pleural effusion or pneumothorax. IMPRESSION: Cardiomegaly with mild central congestion. Electronically Signed   By: Jasmine Pang M.D.   On: 08/01/2023 18:07    Pertinent labs & imaging results that were available during my care of the patient were reviewed by me and considered in my medical decision making (see MDM for details).  Medications Ordered in ED Medications  metoCLOPramide (REGLAN) injection 10 mg (10 mg Intravenous Given 08/02/23 1130)  HYDROmorphone (DILAUDID) injection 0.5 mg (0.5 mg Intravenous Given 08/02/23 1131)  droperidol (INAPSINE) 2.5 MG/ML injection 1.25 mg (1.25 mg Intravenous Given 08/02/23 1400)                                                                                                                                     Procedures Procedures  (including critical care time)  Medical Decision Making / ED Course   MDM:  38 year old male presenting to the emergency department with vomiting.  Patient appears uncomfortable, was forcibly making himself vomit, I told him to stop doing this.  He did not have any significant abdominal tenderness on exam.  Presentation seems concerning for gastroparesis, or cyclical vomiting syndrome.  He did improve with droperidol as well as Reglan.  Given his complicated medical history, CT scan was obtained although patient did have CT scan yesterday.  Obtain scan to evaluate for developing process.  Appears unchanged.  Laboratory testing notable for findings of end-stage renal disease, slightly increased anion gap from yesterday consistent with uremia.  No hyperkalemia or other severe electrolyte disturbance.  Patient did develop some hypoxia to mid 80s after receiving pain medication was placed on oxygen.  Suspect this is likely due to sedation, however if patient is persistently hypoxic he may need dialysis  today.  Signed out to oncoming physician Dr. Posey Rea pending reassessment.      Additional history obtained: -Additional history obtained from ems -External records from outside source obtained and reviewed including: Chart review including previous notes, labs, imaging, consultation notes including ER visit yesterday    Lab Tests: -I ordered, reviewed, and interpreted labs.   The pertinent results include:   Labs Reviewed  COMPREHENSIVE METABOLIC PANEL - Abnormal; Notable for the following components:      Result Value   Chloride 94 (*)    CO2 18 (*)    Glucose, Bld 114 (*)    BUN 89 (*)    Creatinine, Ser 17.99 (*)    Calcium 7.9 (*)  Total Protein 8.7 (*)    Total Bilirubin 1.3 (*)    GFR, Estimated 3 (*)    Anion gap 27 (*)    All other components within normal limits  CBC WITH DIFFERENTIAL/PLATELET - Abnormal; Notable for the following components:   WBC 11.6 (*)    RBC 3.39 (*)    Hemoglobin 9.3 (*)    HCT 28.8 (*)    RDW 18.7 (*)    Neutro Abs 10.6 (*)    Lymphs Abs 0.4 (*)    All other components within normal limits    Notable for findings of ESRD, elevated anion gap. No hyperkalemia   EKG   EKG Interpretation Date/Time:  Tuesday August 02 2023 11:23:47 EST Ventricular Rate:  103 PR Interval:  175 QRS Duration:  94 QT Interval:  386 QTC Calculation: 506 R Axis:   53  Text Interpretation: Sinus tachycardia ST elev, probable normal early repol pattern Borderline QT interval No significant change since last tracing Confirmed by Alvino Blood ([id number]) on 08/02/2023 11:34:05 AM         Imaging Studies ordered: I ordered imaging studies including CT abdomen  On my interpretation imaging demonstrates no acute process I independently visualized and interpreted imaging. I agree with the radiologist interpretation   Medicines ordered and prescription drug management: Meds ordered this encounter  Medications   metoCLOPramide (REGLAN) injection 10 mg    HYDROmorphone (DILAUDID) injection 0.5 mg   DISCONTD: droperidol (INAPSINE) 2.5 MG/ML injection 1.25 mg   droperidol (INAPSINE) 2.5 MG/ML injection 1.25 mg    -I have reviewed the patients home medicines and have made adjustments as needed    Cardiac Monitoring: The patient was maintained on a cardiac monitor.  I personally viewed and interpreted the cardiac monitored which showed an underlying rhythm of: sinus tachycardia    Reevaluation: After the interventions noted above, I reevaluated the patient and found that their symptoms have improved  Co morbidities that complicate the patient evaluation  Past Medical History:  Diagnosis Date   ADHD (attention deficit hyperactivity disorder)    Diabetes mellitus    ESRD on hemodialysis (HCC)    [facility]   High cholesterol    Hypertension    Hypertrophic cardiomyopathy (HCC) 02/26/2021   Seizure (HCC) 02/08/2021      Dispostion: Disposition decision including need for hospitalization was considered, and patient disposition pending at time of sign out.    Final Clinical Impression(s) / ED Diagnoses Final diagnoses:  Nausea and vomiting, unspecified vomiting type     This chart was dictated using voice recognition software.  Despite best efforts to proofread,  errors can occur which can change the documentation meaning.    Lonell Grandchild, MD 08/02/23 [phone]

## 2023-08-02 NOTE — ED Provider Notes (Signed)
  Physical Exam  BP (!) 167/84   Pulse 91   Temp 98.5 F (36.9 C) (Oral)   Resp (!) 23   Ht 6' (1.829 m)   Wt 90.7 kg   SpO2 92%   BMI 27.12 kg/m   Physical Exam Constitutional:      General: He is not in acute distress.    Appearance: Normal appearance.  HENT:     Head: Normocephalic and atraumatic.     Nose: No congestion or rhinorrhea.  Eyes:     General:        Right eye: No discharge.        Left eye: No discharge.     Extraocular Movements: Extraocular movements intact.     Pupils: Pupils are equal, round, and reactive to light.  Cardiovascular:     Rate and Rhythm: Normal rate and regular rhythm.     Heart sounds: No murmur heard. Pulmonary:     Effort: No respiratory distress.     Breath sounds: No wheezing or rales.  Abdominal:     General: There is no distension.     Tenderness: There is abdominal tenderness.  Musculoskeletal:        General: Normal range of motion.     Cervical back: Normal range of motion.  Skin:    General: Skin is warm and dry.  Neurological:     General: No focal deficit present.     Mental Status: He is alert.     Procedures  .Critical Care  Performed by: Glendora Score, MD Authorized by: Glendora Score, MD   Critical care provider statement:    Critical care time (minutes):  30   Critical care was necessary to treat or prevent imminent or life-threatening deterioration of the following conditions:  Renal failure and respiratory failure   Critical care was time spent personally by me on the following activities:  Development of treatment plan with patient or surrogate, discussions with consultants, evaluation of patient's response to treatment, examination of patient, ordering and review of laboratory studies, ordering and review of radiographic studies, ordering and performing treatments and interventions, pulse oximetry, re-evaluation of patient's condition and review of old charts   ED Course / MDM    Medical Decision  Making Amount and/or Complexity of Data Reviewed Labs: ordered. Radiology: ordered. ECG/medicine tests: ordered.  Risk Prescription drug management. Decision regarding hospitalization.   Patient received in handoff.  ESRD on dialysis Tuesday Thursday Saturday with persistent nausea vomiting and abdominal pain.  Patient hypoxic after receiving droperidol and plan for reevaluation after metabolization of droperidol.  On reevaluation, patient remains hypoxic and placed on 2 L.  There is documentation that the patient was sticking his fingers down his throat yesterday in the Emergency Department but on my evaluation he did have an episode of vomiting without self inducing behavior.  Given persistent nausea vomiting despite multiple ER interventions he will require hospital admission.  I spoke with the nephrology attending who will help arrange inpatient dialysis.  Patient admitted       Glendora Score, MD 08/02/23 [phone]

## 2023-08-02 NOTE — Assessment & Plan Note (Addendum)
Blood pressure persistently elevated up to 210/124, minimal response to IV labetalol 10 mg.  He is on clonidine 0.1 mg which she reports compliance with.  Likely component of rebound hypertension, do not able to keep his medications down. - IV Labetalol 20 mg x 1 ( Allergy to hydralazine) -Resume 0.1 mg clonidine as patch -Hold carvedilol 25 mg twice daily with vomiting -Blood pressure persistently elevated, no improvement with labetalol, will start nicardipine drip (Allergy to Norvasc noted).

## 2023-08-02 NOTE — H&P (Addendum)
History and Physical    [name]:[id number] DOB: 1985-07-23 DOA: 08/02/2023  PCP: Rema Fendt, NP   Patient coming from: Home  I have personally briefly reviewed patient's old medical records in [facility]  Chief Complaint: Vomiting  HPI: [name] is a 38 y.o. male with medical history significant for ESRD, diabetes mellitus, hypertrophic cardiomyopathy, congestive heart failure, blindness. Patient presented to the ED with complaints of multiple episodes of vomiting that started yesterday.  Who was in the ED at [facility] yesterday for same.  Workup was unremarkable.  He had a CT scan which was also unremarkable. In the ED here he would also put his fingers down his throat and started vomiting.  He reports this provided some relief.  Last bowel movement was today, normal consistency.  Reports mid abdominal pain. On my evaluation, coffee ground appearing emesis on the floor.  Patient is unaware if his prior emesis episodes were the same color, as he is blind and so cannot tell. He reports compliance with his HD sessions, he is on dialysis Tuesday Thursday Saturday, his last complete session was on Saturday.  Today he was 10 minutes into his dialysis when he was sent to the ED due to vomiting.  ED Course: Hypotensive blood pressure up to 210/124, Tmax 98.2.  Heart rate 90-109.  Respiratory rate 12-38. WBC 11.6.  BUN 89.  Serum bicarb of 18, anion gap of 27. Droperidol given in ED with improvement in symptoms.  Dilaudid 0.5 also given-but O2 sats dropped to 82%.  Reglan also given. Repeat CT abdomen and pelvis with contrast today-no acute intra-abdominal abnormalities, but shows cardiomegaly and pulmonary edema in lower lungs slightly increased from prior exam. With persistent intractable vomiting, chest imaging findings, and hypoxia, hospitalist to admit. EDP talked to nephrology, plans for HD in AM.  Review of Systems: As per HPI all other systems reviewed and  negative.  Past Medical History:  Diagnosis Date   ADHD (attention deficit hyperactivity disorder)    Diabetes mellitus    ESRD on hemodialysis (HCC)    [facility]   High cholesterol    Hypertension    Hypertrophic cardiomyopathy (HCC) 02/26/2021   Seizure (HCC) 02/08/2021    Past Surgical History:  Procedure Laterality Date   AMPUTATION TOE Right 12/29/2021   Procedure: AMPUTATION TOE, fifth;  Surgeon: Louann Sjogren, DPM;  Location: [facility] OR;  Service: Podiatry;  Laterality: Right;  surgical team will do local block   AV FISTULA PLACEMENT Left 08/11/2020   Procedure: LEFT UPPER EXTREMITY ARTERIOVENOUS (AV) FISTULA CREATION;  Surgeon: Leonie Douglas, MD;  Location: [facility] OR;  Service: Vascular;  Laterality: Left;   FISTULA SUPERFICIALIZATION Left 12/02/2021   Procedure: PLICATION OF LEFT RADIUS CEPHALIC FISTULA;  Surgeon: Leonie Douglas, MD;  Location: [facility] OR;  Service: Vascular;  Laterality: Left;  PERIPHERAL NERVE BLOCK   FRACTURE SURGERY     I & D EXTREMITY Left 07/16/2014   Procedure: IRRIGATION AND DEBRIDEMENT EXTREMITY/LEFT INDEX FINGER;  Surgeon: Betha Loa, MD;  Location: [facility] OR;  Service: Orthopedics;  Laterality: Left;   IR FLUORO GUIDE CV LINE RIGHT  10/04/2022   IR FLUORO GUIDE CV LINE RIGHT  10/05/2022   IR PERC TUN PERIT CATH WO PORT S&I /IMAG  08/07/2020   IR REMOVAL TUN CV CATH W/O FL  10/27/2022   IR THROMBECTOMY AV FISTULA W/THROMBOLYSIS/PTA INC/SHUNT/IMG LEFT Left 10/05/2022   IR US GUIDE VASC ACCESS RIGHT  08/07/2020  IR US GUIDE VASC ACCESS RIGHT  10/04/2022   IR US GUIDE VASC ACCESS RIGHT  10/05/2022   TEE WITHOUT CARDIOVERSION N/A 10/28/2022   Procedure: TRANSESOPHAGEAL ECHOCARDIOGRAM;  Surgeon: Thomasene Ripple, DO;  Location: [facility] INVASIVE CV LAB;  Service: Cardiovascular;  Laterality: N/A;     reports that he has never smoked. He has never been exposed to tobacco smoke. He has never used smokeless tobacco. He reports that he does not currently use alcohol. He  reports that he does not use drugs.  Allergies  Allergen Reactions   Apresoline [Hydralazine] Nausea And Vomiting and Other (See Comments)    Patient was taking Hydralazine AND Amlodipine at the same time, so the reactions came from one of the 2: Lethargy and an all-over feeling of NOT feeling well   Norvasc [Amlodipine] Nausea And Vomiting and Other (See Comments)    Patient was taking Amlodipine AND Hydralazine at the same time, so the reactions came from one of the 2: Lethargy and an all-over feeling of NOT feeling well     Family History  Problem Relation Age of Onset   Cancer Mother    Stroke Father    Diabetes Father    Hypertension Father     Prior to Admission medications   Medication Sig Start Date End Date Taking? Authorizing Provider  acetaminophen (TYLENOL) 325 MG tablet Take 2 tablets (650 mg total) by mouth every 6 (six) hours as needed for mild pain (or Fever >/= 101). 12/30/21  Yes Rhetta Mura, MD  atorvastatin (LIPITOR) 40 MG tablet Take 1 tablet (40 mg total) by mouth daily. 01/18/23 09/17/23 Yes Gloris Manchester, MD  calcitRIOL (ROCALTROL) 0.5 MCG capsule Take 2 capsules (1 mcg total) by mouth Every Tuesday,Thursday,and Saturday with dialysis. 12/31/21  Yes Rhetta Mura, MD  carvedilol (COREG) 25 MG tablet Take 1 tablet (25 mg total) by mouth 2 (two) times daily with a meal. 01/18/23 09/17/23 Yes Gloris Manchester, MD  cloNIDine (CATAPRES) 0.1 MG tablet Take 1 tablet (0.1 mg total) by mouth 2 (two) times daily. 01/18/23  Yes Gloris Manchester, MD  diphenhydramine-acetaminophen (TYLENOL PM) 25-500 MG TABS tablet Take 2 tablets by mouth 2 (two) times daily as needed (pain, sleep).   Yes [provider]  insulin glargine-yfgn (SEMGLEE) 100 UNIT/ML injection Inject 0.12 mLs (12 Units total) into the skin at bedtime. 01/18/23  Yes Gloris Manchester, MD  insulin lispro (HUMALOG) 100 UNIT/ML injection Inject 0.01-0.07 mLs (1-7 Units total) into the skin See admin instructions. Per sliding  scale 3 times daily with meals 151-200= 1 unit 201-250= 2 units 251-300= 3 units 301-350= 5 units 351-400= 7 units Greater than 400 call md takes only as needed if blood sugar is over 200 01/18/23  Yes Gloris Manchester, MD  metoCLOPramide (REGLAN) 10 MG tablet Take 1 tablet (10 mg total) by mouth every 6 (six) hours for 3 days. 08/01/23 08/04/23 Yes Mikeal Hawthorne, MD  Multiple Vitamins-Minerals (MULTIVITAMIN WITH MINERALS) tablet Take 1 tablet by mouth daily.   Yes [provider]  sodium zirconium cyclosilicate (LOKELMA) 10 g PACK packet Take 10 g by mouth See admin instructions. Take 1 packet (10g) once daily on non-dialysis days (Sunday, Monday, Wednesday, Friday)   Yes [provider]  Insulin Pen Needle (PEN NEEDLES) 31G X 8 MM MISC UAD 07/09/22   Rema Fendt, NP    Physical Exam: Vitals:   08/02/23 1905 08/02/23 1907 08/02/23 1924 08/02/23 1945  BP: (!) 209/123 (!) 204/114 (!) 197/117 (!) 194/117  Pulse:   100   Resp:   16   Temp:   98 F (36.7 C)   TempSrc:      SpO2:   96%   Weight:      Height:        Constitutional: Restless, Vitals:   08/02/23 1905 08/02/23 1907 08/02/23 1924 08/02/23 1945  BP: (!) 209/123 (!) 204/114 (!) 197/117 (!) 194/117  Pulse:   100   Resp:   16   Temp:   98 F (36.7 C)   TempSrc:      SpO2:   96%   Weight:      Height:       Eyes: Blind.  Weberg-opaque cast to both pupils.  Lids and conjunctivae normal ENMT: Mucous membranes are dry Neck: normal, supple, no masses, no thyromegaly Respiratory: clear to auscultation bilaterally, no wheezing. Normal respiratory effort. No accessory muscle use.  Cardiovascular: Regular rate and rhythm, no murmurs / rubs / gallops. No extremity edema. 2+ pedal pulses. No carotid bruits.  Abdomen: no tenderness, no masses palpated. No hepatosplenomegaly. Bowel sounds positive.  Musculoskeletal: no clubbing / cyanosis. No joint deformity upper and lower extremities.  Skin: no rashes, lesions,  ulcers. No induration Neurologic: No facial asymmetry, moving extremities spontaneously, speech fluent.  Psychiatric: Normal judgment and insight. Alert and oriented x 3. Normal mood.   Labs on Admission: I have personally reviewed following labs and imaging studies  CBC: Recent Labs  Lab 08/01/23 1815 08/02/23 1120  WBC 9.4 11.6*  NEUTROABS 8.5* 10.6*  HGB 9.5* 9.3*  HCT 30.7* 28.8*  MCV 85.8 85.0  PLT 251 262   Basic Metabolic Panel: Recent Labs  Lab 08/01/23 1815 08/02/23 1120  NA 137 139  K 4.8 5.1  CL 96* 94*  CO2 21* 18*  GLUCOSE 134* 114*  BUN 75* 89*  CREATININE 16.93* 17.99*  CALCIUM 7.5* 7.9*  MG 2.4  --    GFR: Estimated Creatinine Clearance: 6.1 mL/min (A) (by C-G formula based on SCr of 17.99 mg/dL (H)). Liver Function Tests: Recent Labs  Lab 08/01/23 1815 08/02/23 1120  AST 12* 18  ALT 10 19  ALKPHOS 48 56  BILITOT 1.1 1.3*  PROT 7.7 8.7*  ALBUMIN 3.9 4.4   Recent Labs  Lab 08/01/23 1815  LIPASE 24   HbA1C: Recent Labs    08/01/23 2329  HGBA1C 6.0*    Radiological Exams on Admission: CT ABDOMEN PELVIS WO CONTRAST Result Date: 08/02/2023 CLINICAL DATA:  Abdominal pain, acute, nonlocalized EXAM: CT ABDOMEN AND PELVIS WITHOUT CONTRAST TECHNIQUE: Multidetector CT imaging of the abdomen and pelvis was performed following the standard protocol without IV contrast. RADIATION DOSE REDUCTION: This exam was performed according to the departmental dose-optimization program which includes automated exposure control, adjustment of the mA and/or kV according to patient size and/or use of iterative reconstruction technique. COMPARISON:  CT abdomen/pelvis dated August 01, 2023. FINDINGS: Lower chest: Interlobular septal thickening and increased ground-glass opacities in the bilateral lower lungs, most compatible with pulmonary edema. Cardiomegaly. Hepatobiliary: No suspicious focal hepatic lesion identified, within the limits of an unenhanced exam.  Gallbladder is unremarkable. No biliary dilatation. Pancreas: Unremarkable. No pancreatic ductal dilatation or surrounding inflammatory changes. Spleen: Normal in size without focal abnormality. Adrenals/Urinary Tract: Adrenal glands are unremarkable. 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 No hydronephrosis. Bladder is unremarkable given the degree of distention. Stomach/Bowel: Stomach is collapsed limiting detailed evaluation. Appendix appears normal. No evidence of bowel  wall thickening, distention, or inflammatory changes. Vascular/Lymphatic: The abdominal aorta is normal in caliber. Branch vascular calcifications. No enlarged abdominal or pelvic lymph nodes. Reproductive: Prostate is unremarkable. Other: No abdominopelvic ascites or intraperitoneal free air. No abdominal wall hernia. Musculoskeletal: No acute or significant osseous findings. IMPRESSION: 1. No acute localizing findings in the abdomen or pelvis. 2. 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 No hydronephrosis. 3. Cardiomegaly with pulmonary edema in the lower lungs, slightly increased compared to the prior exam. Electronically Signed   By: Hart Robinsons M.D.   On: 08/02/2023 15:03   DG Abdomen Acute W/Chest Result Date: 08/02/2023 CLINICAL DATA:  Abdominal pain EXAM: DG ABDOMEN ACUTE WITH 1 VIEW CHEST COMPARISON:  Chest x-ray 08/01/2023.  CT 08/01/2023. FINDINGS: Enlarged cardiopericardial silhouette with prominence of the vasculature. Question component of interstitial edema. No pneumothorax or effusion. No consolidation. Overlapping cardiac leads. Overall only minimal bowel gas identified. There is some air in the nondilated stomach. No obvious dilated air-filled loops of bowel. Prominent vascular calcifications. Overlapping cardiac leads. No obvious free air beneath the diaphragm on the upright view. Abdominal images are under  penetrated IMPRESSION: Enlarged heart with vascular congestion and question trace edema Minimal bowel gas, nonspecific. No obvious free air dilatation of bowel. Under penetrated abdominal radiographs Electronically Signed   By: Karen Kays M.D.   On: 08/02/2023 12:53   CT ABDOMEN PELVIS WO CONTRAST Result Date: 08/01/2023 CLINICAL DATA:  Left lower quadrant abdominal pain EXAM: CT ABDOMEN AND PELVIS WITHOUT CONTRAST TECHNIQUE: Multidetector CT imaging of the abdomen and pelvis was performed following the standard protocol without IV contrast. RADIATION DOSE REDUCTION: This exam was performed according to the departmental dose-optimization program which includes automated exposure control, adjustment of the mA and/or kV according to patient size and/or use of iterative reconstruction technique. COMPARISON:  01/18/2023 FINDINGS: Lower chest: Interlobular septal thickening and ground-glass opacities in the lower lungs compatible with pulmonary edema. Cardiomegaly. Hepatobiliary: Unremarkable noncontrast appearance of the liver, gallbladder, and biliary tree. Pancreas: Unremarkable. Spleen: Unremarkable. Adrenals/Urinary Tract: Normal adrenal glands. Horseshoe kidney. Nonobstructing punctate calyceal stones in the left moiety. No hydronephrosis. Nondistended thick-walled bladder. Stomach/Bowel: No bowel obstruction or bowel wall thickening. Stomach and appendix are within normal limits. Vascular/Lymphatic: Aortic atherosclerosis. No enlarged abdominal or pelvic lymph nodes. Reproductive: Unremarkable. Other: Vascular congestion in the mesentery. No free intraperitoneal fluid or air. Musculoskeletal: No acute fracture. IMPRESSION: 1. Bladder wall thickening may be due to underdistention or cystitis. 2. 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 3. Cardiomegaly with pulmonary edema in the lower lungs. Aortic Atherosclerosis (ICD10-I70.0). Electronically Signed   By: Minerva Fester M.D.   On: 08/01/2023 20:26   DG Chest 1 View Result Date: 08/01/2023 CLINICAL DATA:  Chest pain EXAM: CHEST  1 VIEW COMPARISON:  05/22/2023 FINDINGS: Cardiomegaly with mild central congestion. No focal airspace disease, pleural effusion or pneumothorax. IMPRESSION: Cardiomegaly with mild central congestion. Electronically Signed   By: Jasmine Pang M.D.   On: 08/01/2023 18:07    EKG: Independently reviewed.  Sinus tachycardia, rate 506, no significant change from prior.  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  Possibly secondary to gastroparesis, considering he is diabetic, blind, and with renal failure. -QT prolonged at 506 -IV Phenergan -Trial of 0.5 mg of IV Ativan -Clear liquid diet   Hematemesis Coffee-ground emesis in the setting of intractable vomiting.  Consider Mallory-Weiss tear, peptic ulcer disease, gastritis -IV Protonix 40 twice daily -Trend hemoglobin -Clear liquid diet, n.p.o. midnight -If persistent emesis, consider GI eval   Acute hypoxic respiratory failure (HCC) O2 sat dropped to 82% on room air status post 0.5 mg of Dilaudid.  Later ambulated sats 87%.  Chest imaging with pulmonary edema. - Per  HD  Hypertensive urgency Blood pressure persistently elevated up to 210/124, minimal response to IV labetalol 10 mg.  He is on clonidine 0.1 mg which she reports compliance with.  Likely component of rebound hypertension, do not able to keep his medications down. - IV Labetalol 20 mg x 1 ( Allergy to hydralazine) -Resume  0.1 mg clonidine as patch -Hold carvedilol 25 mg twice daily with vomiting -Blood pressure persistently elevated, no improvement with labetalol, will start nicardipine drip (Allergy to Norvasc noted).  Acute on chronic diastolic CHF (congestive heart failure) (HCC) Chest imaging showing pulmonary edema, last echo 10/2022, EF 55 to 60%. -Per HD  ESRD (end stage renal disease) on dialysis [facility]) On dialysis Tuesday Thursday Saturday, reports he has not missed any HD session.  Was 10 minutes into his HD session today when he was sent to the ED.  Elevated BUN at 89.  Potassium 5.1.  Blood pressure elevated. - EDP talked to nephrology- Dr. Ronalee Belts, will dialyze in a.m.  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 On long-acting insulin 6 units nightly. -Beta hydroxybutyrate, rule out DKA - SSI- [place] for now -Hold home insulin with poor oral intake   CRITICAL CARE Performed by: Onnie Boer   Total critical care time: 70 minutes  Critical care time was exclusive of separately billable procedures and treating other patients.  Critical care was necessary to treat or prevent imminent or life-threatening deterioration.  Critical care was time spent personally by me on the following activities: development of treatment plan with patient and/or surrogate as well as nursing, discussions with consultants, evaluation of patient's response to treatment, examination of patient, obtaining history from patient or surrogate, ordering and performing treatments and interventions, ordering and review of laboratory studies, ordering and review of radiographic studies, pulse oximetry and re-evaluation of patient's condition.   DVT prophylaxis: SCDs Code Status: Full code Family Communication: None at bedside Disposition Plan: ~ 2 days Consults called: Nephrology Admission status: Inpt stepdown I certify that at the  point of admission it is my clinical judgment that the patient will require inpatient hospital care spanning beyond 2 midnights from the point of admission due to high intensity of service, high risk for further deterioration and high frequency of surveillance required.   Author: Onnie Boer, MD 08/02/2023 9:22 PM  For on call review [email or web address].

## 2023-08-02 NOTE — Assessment & Plan Note (Signed)
Denies prior episodes, CT abdomen and pelvis negative for acute intra-abdominal pathology.  Possibly secondary to gastroparesis, considering he is diabetic, blind, and with renal failure. -QT prolonged at 506 -IV Phenergan -Trial of 0.5 mg of IV Ativan -Clear liquid diet

## 2023-08-02 NOTE — Assessment & Plan Note (Signed)
O2 sat dropped to 82% on room air status post 0.5 mg of Dilaudid.  Later ambulated sats 87%.  Chest imaging with pulmonary edema. - Per  HD

## 2023-08-02 NOTE — ED Notes (Signed)
Dr Mariea Clonts aware of htn and will put in orders.

## 2023-08-02 NOTE — Assessment & Plan Note (Addendum)
Blood glucose 114, serum bicarb of 18, and anion gap of 27. On long-acting insulin 6 units nightly. -Beta hydroxybutyrate, rule out DKA - SSI- [place] for now -Hold home insulin with poor oral intake

## 2023-08-03 DIAGNOSIS — R112 Nausea with vomiting, unspecified: Secondary | ICD-10-CM | POA: Diagnosis not present

## 2023-08-03 DIAGNOSIS — D649 Anemia, unspecified: Secondary | ICD-10-CM | POA: Diagnosis not present

## 2023-08-03 DIAGNOSIS — R1013 Epigastric pain: Secondary | ICD-10-CM

## 2023-08-03 DIAGNOSIS — R1110 Vomiting, unspecified: Secondary | ICD-10-CM | POA: Diagnosis not present

## 2023-08-03 LAB — CBC
HCT: 26 % — ABNORMAL LOW (ref 39.0–52.0)
Hemoglobin: 8.4 g/dL — ABNORMAL LOW (ref 13.0–17.0)
MCH: 27.2 pg (ref 26.0–34.0)
MCHC: 32.3 g/dL (ref 30.0–36.0)
MCV: 84.1 fL (ref 80.0–100.0)
Platelets: 213 10*3/uL (ref 150–400)
RBC: 3.09 MIL/uL — ABNORMAL LOW (ref 4.22–5.81)
RDW: 18.7 % — ABNORMAL HIGH (ref 11.5–15.5)
WBC: 11.4 10*3/uL — ABNORMAL HIGH (ref 4.0–10.5)
nRBC: 0 % (ref 0.0–0.2)

## 2023-08-03 LAB — GLUCOSE, CAPILLARY
Glucose-Capillary: 101 mg/dL — ABNORMAL HIGH (ref 70–99)
Glucose-Capillary: 110 mg/dL — ABNORMAL HIGH (ref 70–99)
Glucose-Capillary: 124 mg/dL — ABNORMAL HIGH (ref 70–99)
Glucose-Capillary: 135 mg/dL — ABNORMAL HIGH (ref 70–99)
Glucose-Capillary: 153 mg/dL — ABNORMAL HIGH (ref 70–99)
Glucose-Capillary: 154 mg/dL — ABNORMAL HIGH (ref 70–99)

## 2023-08-03 LAB — BLOOD GAS, VENOUS
Acid-Base Excess: 1 mmol/L (ref 0.0–2.0)
Bicarbonate: 26 mmol/L (ref 20.0–28.0)
Drawn by: 43361
O2 Saturation: 84.9 %
Patient temperature: 36.9
pCO2, Ven: 42 mm[Hg] — ABNORMAL LOW (ref 44–60)
pH, Ven: 7.4 (ref 7.25–7.43)
pO2, Ven: 54 mm[Hg] — ABNORMAL HIGH (ref 32–45)

## 2023-08-03 LAB — BASIC METABOLIC PANEL
Anion gap: 20 — ABNORMAL HIGH (ref 5–15)
BUN: 101 mg/dL — ABNORMAL HIGH (ref 6–20)
CO2: 23 mmol/L (ref 22–32)
Calcium: 7.2 mg/dL — ABNORMAL LOW (ref 8.9–10.3)
Chloride: 99 mmol/L (ref 98–111)
Creatinine, Ser: 20.35 mg/dL — ABNORMAL HIGH (ref 0.61–1.24)
GFR, Estimated: 3 mL/min — ABNORMAL LOW (ref >60)
Glucose, Bld: 147 mg/dL — ABNORMAL HIGH (ref 70–99)
Potassium: 4.7 mmol/L (ref 3.5–5.1)
Sodium: 142 mmol/L (ref 135–145)

## 2023-08-03 LAB — BETA-HYDROXYBUTYRIC ACID: Beta-Hydroxybutyric Acid: 0.37 mmol/L — ABNORMAL HIGH (ref 0.05–0.27)

## 2023-08-03 LAB — HEPATITIS B SURFACE ANTIGEN: Hepatitis B Surface Ag: NONREACTIVE

## 2023-08-03 LAB — HEMOGLOBIN AND HEMATOCRIT, BLOOD
HCT: 26.8 % — ABNORMAL LOW (ref 39.0–52.0)
Hemoglobin: 8.7 g/dL — ABNORMAL LOW (ref 13.0–17.0)

## 2023-08-03 LAB — MRSA NEXT GEN BY PCR, NASAL: MRSA by PCR Next Gen: NOT DETECTED

## 2023-08-03 MED ORDER — CHLORHEXIDINE GLUCONATE CLOTH 2 % EX PADS: 6.0000 | MEDICATED_PAD | Freq: Every day | CUTANEOUS | Status: DC

## 2023-08-03 MED ORDER — LORAZEPAM 2 MG/ML IJ SOLN
0.5000 mg | Freq: Once | INTRAMUSCULAR | Status: AC
  Administered 2023-08-03: 0.5 mg via INTRAVENOUS
  Filled 2023-08-03: qty 1

## 2023-08-03 MED ORDER — LORAZEPAM 2 MG/ML IJ SOLN
0.5000 mg | Freq: Four times a day (QID) | INTRAMUSCULAR | Status: DC | PRN
  Administered 2023-08-05: 0.5 mg via INTRAVENOUS
  Filled 2023-08-03: qty 1

## 2023-08-03 MED ORDER — DARBEPOETIN ALFA 100 MCG/0.5ML IJ SOSY
100.0000 ug | PREFILLED_SYRINGE | Freq: Once | INTRAMUSCULAR | Status: AC
  Administered 2023-08-03: 100 ug via SUBCUTANEOUS
  Filled 2023-08-03: qty 0.5

## 2023-08-03 MED ORDER — PENTAFLUOROPROP-TETRAFLUOROETH EX AERO: 1.0000 | INHALATION_SPRAY | CUTANEOUS | Status: DC | PRN

## 2023-08-03 NOTE — Progress Notes (Signed)
Received permission from patient to give information to patient's mom, Judeth Cornfield.

## 2023-08-03 NOTE — Procedures (Signed)
HD Note:  Some information was entered later than the data was gathered due to patient care needs. The stated time with the data is accurate.  Patient treatment completed at bedside.  Alert and oriented.  Patient is blind.  There was verbal consent signed by two witnessing Rns and placed in the chart  Informed consent signed and in chart.   Access used: Left lower arm fistula Access issues: mid treatment, BFR had to be reduced to 350 to maintain appropriate venous pressures.  See flowsheet  Patient tolerated treatment well.   TX duration: 3.5 hours  Alert, without acute distress.  Total UF removed: 3500 ml  Hand-off given to patient's nurse.    Kaileb Monsanto L. Dareen Piano, RN [facility] Dialysis Unit.

## 2023-08-03 NOTE — Consult Note (Signed)
[facility] Renal Consultation Note    Indication for Consultation:  Management of ESRD/hemodialysis; anemia, hypertension/volume and secondary hyperparathyroidism  HPI: [name] is a 38 y.o. male with a PMH significant for DM type 2, HTN, horseshoe kidney, nephrolithiasis, hypertrophic cardiomyopathy, CHF, blindness, and ESRD on HD TTS at [facility] who presented to [facility] ED on 08/02/23 from outpatient HD with intractable N/V associated with epigastric pain.  In the ED, Temp 98.2, Bp 195/119, HR 92, SpO2 95%.  Labs notable for K 5.1, Co2 18, BUN 89, Cr 17.99, WBC 11.6, Hgb 9.3, BNP 3974.  CT scan without acute findings, pulmonary edema seen along with cardiomegaly.  He was admitted for intractable nausea and vomiting, and hematemesis.  We were consulted to provide dialysis during his hospitalization.  He only received 10 minutes of dialysis yesterday.   He reports compliance with HD and is feeling better this morning.   Past Medical History:  Diagnosis Date   ADHD (attention deficit hyperactivity disorder)    Diabetes mellitus    ESRD on hemodialysis (HCC)    [facility]   High cholesterol    Hypertension    Hypertrophic cardiomyopathy (HCC) 02/26/2021   Seizure (HCC) 02/08/2021   Past Surgical History:  Procedure Laterality Date   AMPUTATION TOE Right 12/29/2021   Procedure: AMPUTATION TOE, fifth;  Surgeon: Louann Sjogren, DPM;  Location: [facility] OR;  Service: Podiatry;  Laterality: Right;  surgical team will do local block   AV FISTULA PLACEMENT Left 08/11/2020   Procedure: LEFT UPPER EXTREMITY ARTERIOVENOUS (AV) FISTULA CREATION;  Surgeon: Leonie Douglas, MD;  Location: [facility] OR;  Service: Vascular;  Laterality: Left;   FISTULA SUPERFICIALIZATION Left 12/02/2021   Procedure: PLICATION OF LEFT RADIUS CEPHALIC FISTULA;  Surgeon: Leonie Douglas, MD;  Location: [facility] OR;  Service: Vascular;  Laterality: Left;  PERIPHERAL NERVE BLOCK   FRACTURE SURGERY     I &  D EXTREMITY Left 07/16/2014   Procedure: IRRIGATION AND DEBRIDEMENT EXTREMITY/LEFT INDEX FINGER;  Surgeon: Betha Loa, MD;  Location: [facility] OR;  Service: Orthopedics;  Laterality: Left;   IR FLUORO GUIDE CV LINE RIGHT  10/04/2022   IR FLUORO GUIDE CV LINE RIGHT  10/05/2022   IR PERC TUN PERIT CATH WO PORT S&I /IMAG  08/07/2020   IR REMOVAL TUN CV CATH W/O FL  10/27/2022   IR THROMBECTOMY AV FISTULA W/THROMBOLYSIS/PTA INC/SHUNT/IMG LEFT Left 10/05/2022   IR US GUIDE VASC ACCESS RIGHT  08/07/2020   IR US GUIDE VASC ACCESS RIGHT  10/04/2022   IR US GUIDE VASC ACCESS RIGHT  10/05/2022   TEE WITHOUT CARDIOVERSION N/A 10/28/2022   Procedure: TRANSESOPHAGEAL ECHOCARDIOGRAM;  Surgeon: Thomasene Ripple, DO;  Location: [facility] INVASIVE CV LAB;  Service: Cardiovascular;  Laterality: N/A;   Family History:   Family History  Problem Relation Age of Onset   Cancer Mother    Stroke Father    Diabetes Father    Hypertension Father    Social History:  reports that he has never smoked. He has never been exposed to tobacco smoke. He has never used smokeless tobacco. He reports that he does not currently use alcohol. He reports that he does not use drugs. Allergies  Allergen Reactions   Apresoline [Hydralazine] Nausea And Vomiting and Other (See Comments)    Patient was taking Hydralazine AND Amlodipine at the same time, so the reactions came from one of the 2: Lethargy and an all-over feeling of NOT feeling well   Norvasc [Amlodipine]  Nausea And Vomiting and Other (See Comments)    Patient was taking Amlodipine AND Hydralazine at the same time, so the reactions came from one of the 2: Lethargy and an all-over feeling of NOT feeling well    Prior to Admission medications   Medication Sig Start Date End Date Taking? Authorizing Provider  acetaminophen (TYLENOL) 325 MG tablet Take 2 tablets (650 mg total) by mouth every 6 (six) hours as needed for mild pain (or Fever >/= 101). 12/30/21  Yes Rhetta Mura, MD   atorvastatin (LIPITOR) 40 MG tablet Take 1 tablet (40 mg total) by mouth daily. 01/18/23 09/17/23 Yes Gloris Manchester, MD  calcitRIOL (ROCALTROL) 0.5 MCG capsule Take 2 capsules (1 mcg total) by mouth Every Tuesday,Thursday,and Saturday with dialysis. 12/31/21  Yes Rhetta Mura, MD  carvedilol (COREG) 25 MG tablet Take 1 tablet (25 mg total) by mouth 2 (two) times daily with a meal. 01/18/23 09/17/23 Yes Gloris Manchester, MD  cloNIDine (CATAPRES) 0.1 MG tablet Take 1 tablet (0.1 mg total) by mouth 2 (two) times daily. 01/18/23  Yes Gloris Manchester, MD  diphenhydramine-acetaminophen (TYLENOL PM) 25-500 MG TABS tablet Take 2 tablets by mouth 2 (two) times daily as needed (pain, sleep).   Yes [provider]  insulin glargine-yfgn (SEMGLEE) 100 UNIT/ML injection Inject 0.12 mLs (12 Units total) into the skin at bedtime. 01/18/23  Yes Gloris Manchester, MD  insulin lispro (HUMALOG) 100 UNIT/ML injection Inject 0.01-0.07 mLs (1-7 Units total) into the skin See admin instructions. Per sliding scale 3 times daily with meals 151-200= 1 unit 201-250= 2 units 251-300= 3 units 301-350= 5 units 351-400= 7 units Greater than 400 call md takes only as needed if blood sugar is over 200 01/18/23  Yes Gloris Manchester, MD  metoCLOPramide (REGLAN) 10 MG tablet Take 1 tablet (10 mg total) by mouth every 6 (six) hours for 3 days. 08/01/23 08/04/23 Yes Mikeal Hawthorne, MD  Multiple Vitamins-Minerals (MULTIVITAMIN WITH MINERALS) tablet Take 1 tablet by mouth daily.   Yes [provider]  sodium zirconium cyclosilicate (LOKELMA) 10 g PACK packet Take 10 g by mouth See admin instructions. Take 1 packet (10g) once daily on non-dialysis days (Sunday, Monday, Wednesday, Friday)   Yes [provider]  Insulin Pen Needle (PEN NEEDLES) 31G X 8 MM MISC UAD 07/09/22   Rema Fendt, NP   Current Facility-Administered Medications  Medication Dose Route Frequency Provider Last Rate Last Admin   acetaminophen (TYLENOL) tablet 650  mg  650 mg Oral Q6H PRN Emokpae, Ejiroghene E, MD       Or   acetaminophen (TYLENOL) suppository 650 mg  650 mg Rectal Q6H PRN Emokpae, Ejiroghene E, MD       Chlorhexidine Gluconate Cloth 2 % PADS 6 each  6 each Topical Daily Emokpae, Ejiroghene E, MD   6 each at 08/02/23 2032   Chlorhexidine Gluconate Cloth 2 % PADS 6 each  6 each Topical Q0600 Terrial Rhodes, MD       cloNIDine (CATAPRES - Dosed in mg/24 hr) patch 0.1 mg  0.1 mg Transdermal Weekly Emokpae, Ejiroghene E, MD   0.1 mg at 08/02/23 2142   insulin aspart (novoLOG) injection 0-6 Units  0-6 Units Subcutaneous Q4H Emokpae, Ejiroghene E, MD   1 Units at 08/03/23 0801   nicardipine (CARDENE) 20mg  in 0.86% saline IV infusion (0.1 mg/ml)  3-15 mg/hr Intravenous Continuous Emokpae, Ejiroghene E, MD 30 mL/hr at 08/03/23 0659 3 mg/hr at 08/03/23 0659   Oral care mouth  rinse  15 mL Mouth Rinse PRN Emokpae, Ejiroghene E, MD       pantoprazole (PROTONIX) injection 40 mg  40 mg Intravenous Q12H Emokpae, Ejiroghene E, MD   40 mg at 08/03/23 0801   pentafluoroprop-tetrafluoroeth (GEBAUERS) aerosol 1 Application  1 Application Topical PRN Terrial Rhodes, MD       polyethylene glycol (MIRALAX / GLYCOLAX) packet 17 g  17 g Oral Daily PRN Emokpae, Ejiroghene E, MD       promethazine (PHENERGAN) 12.5 mg in sodium chloride 0.9 % 50 mL IVPB  12.5 mg Intravenous Q6H PRN Emokpae, Ejiroghene E, MD 150 mL/hr at 08/03/23 0910 12.5 mg at 08/03/23 0910   Labs: Basic Metabolic Panel: Recent Labs  Lab 08/01/23 1815 08/02/23 1120 08/03/23 0506  NA 137 139 142  K 4.8 5.1 4.7  CL 96* 94* 99  CO2 21* 18* 23  GLUCOSE 134* 114* 147*  BUN 75* 89* 101*  CREATININE 16.93* 17.99* 20.35*  CALCIUM 7.5* 7.9* 7.2*   Liver Function Tests: Recent Labs  Lab 08/01/23 1815 08/02/23 1120  AST 12* 18  ALT 10 19  ALKPHOS 48 56  BILITOT 1.1 1.3*  PROT 7.7 8.7*  ALBUMIN 3.9 4.4   Recent Labs  Lab 08/01/23 1815  LIPASE 24   No results for input(s):  "AMMONIA" in the last 168 hours. CBC: Recent Labs  Lab 08/01/23 1815 08/02/23 1120 08/02/23 2118 08/03/23 0506 08/03/23 0845  WBC 9.4 11.6*  --  11.4*  --   NEUTROABS 8.5* 10.6*  --   --   --   HGB 9.5* 9.3* 9.2* 8.4* 8.7*  HCT 30.7* 28.8* 28.6* 26.0* 26.8*  MCV 85.8 85.0  --  84.1  --   PLT 251 262  --  213  --    Cardiac Enzymes: No results for input(s): "CKTOTAL", "CKMB", "CKMBINDEX", "TROPONINI" in the last 168 hours. CBG: Recent Labs  Lab 08/02/23 2118 08/03/23 0005 08/03/23 0357 08/03/23 0747  GLUCAP 137* 124* 154* 153*   Iron Studies: No results for input(s): "IRON", "TIBC", "TRANSFERRIN", "FERRITIN" in the last 72 hours. Studies/Results: CT ABDOMEN PELVIS WO CONTRAST Result Date: 08/02/2023 CLINICAL DATA:  Abdominal pain, acute, nonlocalized EXAM: CT ABDOMEN AND PELVIS WITHOUT CONTRAST TECHNIQUE: Multidetector CT imaging of the abdomen and pelvis was performed following the standard protocol without IV contrast. RADIATION DOSE REDUCTION: This exam was performed according to the departmental dose-optimization program which includes automated exposure control, adjustment of the mA and/or kV according to patient size and/or use of iterative reconstruction technique. COMPARISON:  CT abdomen/pelvis dated August 01, 2023. FINDINGS: Lower chest: Interlobular septal thickening and increased ground-glass opacities in the bilateral lower lungs, most compatible with pulmonary edema. Cardiomegaly. Hepatobiliary: No suspicious focal hepatic lesion identified, within the limits of an unenhanced exam. Gallbladder is unremarkable. No biliary dilatation. Pancreas: Unremarkable. No pancreatic ductal dilatation or surrounding inflammatory changes. Spleen: Normal in size without focal abnormality. Adrenals/Urinary Tract: Adrenal glands are unremarkable. 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 No hydronephrosis. Bladder is unremarkable given  the degree of distention. Stomach/Bowel: Stomach is collapsed limiting detailed evaluation. Appendix appears normal. No evidence of bowel wall thickening, distention, or inflammatory changes. Vascular/Lymphatic: The abdominal aorta is normal in caliber. Branch vascular calcifications. No enlarged abdominal or pelvic lymph nodes. Reproductive: Prostate is unremarkable. Other: No abdominopelvic ascites or intraperitoneal free air. No abdominal wall hernia. Musculoskeletal: No acute or significant osseous findings. IMPRESSION: 1. No acute localizing findings in the abdomen  or pelvis. 2. 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 No hydronephrosis. 3. Cardiomegaly with pulmonary edema in the lower lungs, slightly increased compared to the prior exam. Electronically Signed   By: Hart Robinsons M.D.   On: 08/02/2023 15:03   DG Abdomen Acute W/Chest Result Date: 08/02/2023 CLINICAL DATA:  Abdominal pain EXAM: DG ABDOMEN ACUTE WITH 1 VIEW CHEST COMPARISON:  Chest x-ray 08/01/2023.  CT 08/01/2023. FINDINGS: Enlarged cardiopericardial silhouette with prominence of the vasculature. Question component of interstitial edema. No pneumothorax or effusion. No consolidation. Overlapping cardiac leads. Overall only minimal bowel gas identified. There is some air in the nondilated stomach. No obvious dilated air-filled loops of bowel. Prominent vascular calcifications. Overlapping cardiac leads. No obvious free air beneath the diaphragm on the upright view. Abdominal images are under penetrated IMPRESSION: Enlarged heart with vascular congestion and question trace edema Minimal bowel gas, nonspecific. No obvious free air dilatation of bowel. Under penetrated abdominal radiographs Electronically Signed   By: Karen Kays M.D.   On: 08/02/2023 12:53   CT ABDOMEN PELVIS WO CONTRAST Result Date: 08/01/2023 CLINICAL DATA:  Left lower quadrant abdominal pain EXAM: CT ABDOMEN AND  PELVIS WITHOUT CONTRAST TECHNIQUE: Multidetector CT imaging of the abdomen and pelvis was performed following the standard protocol without IV contrast. RADIATION DOSE REDUCTION: This exam was performed according to the departmental dose-optimization program which includes automated exposure control, adjustment of the mA and/or kV according to patient size and/or use of iterative reconstruction technique. COMPARISON:  01/18/2023 FINDINGS: Lower chest: Interlobular septal thickening and ground-glass opacities in the lower lungs compatible with pulmonary edema. Cardiomegaly. Hepatobiliary: Unremarkable noncontrast appearance of the liver, gallbladder, and biliary tree. Pancreas: Unremarkable. Spleen: Unremarkable. Adrenals/Urinary Tract: Normal adrenal glands. Horseshoe kidney. Nonobstructing punctate calyceal stones in the left moiety. No hydronephrosis. Nondistended thick-walled bladder. Stomach/Bowel: No bowel obstruction or bowel wall thickening. Stomach and appendix are within normal limits. Vascular/Lymphatic: Aortic atherosclerosis. No enlarged abdominal or pelvic lymph nodes. Reproductive: Unremarkable. Other: Vascular congestion in the mesentery. No free intraperitoneal fluid or air. Musculoskeletal: No acute fracture. IMPRESSION: 1. Bladder wall thickening may be due to underdistention or cystitis. 2. 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 3. Cardiomegaly with pulmonary edema in the lower lungs. Aortic Atherosclerosis (ICD10-I70.0). Electronically Signed   By: Minerva Fester M.D.   On: 08/01/2023 20:26   DG Chest 1 View Result Date: 08/01/2023 CLINICAL DATA:  Chest pain EXAM: CHEST  1 VIEW COMPARISON:  05/22/2023 FINDINGS: Cardiomegaly with mild central congestion. No focal airspace disease, pleural effusion or pneumothorax. IMPRESSION: Cardiomegaly with mild central congestion. Electronically Signed   By: Jasmine Pang M.D.   On: 08/01/2023 18:07     ROS: Pertinent items are noted in HPI. Physical Exam: Vitals:   08/03/23 0645 08/03/23 0700 08/03/23 0812 08/03/23 0900  BP: (!) 140/70 (!) 145/86  (!) 165/84  Pulse: 97 (!) 105    Resp: (!) 23 (!) 32  19  Temp:   98.7 F (37.1 C)   TempSrc:   Axillary   SpO2: 93% (!) 89%    Weight:      Height:          Weight change:   Intake/Output Summary (Last 24 hours) at 08/03/2023 0947 Last data filed at 08/03/2023 0659 Gross per 24 hour  Intake 293.3 ml  Output 1 ml  Net 292.3 ml   BP (!) 165/84   Pulse (!) 105   Temp 98.7  F (37.1 C) (Axillary)   Resp 19   Ht 6' (1.829 m)   Wt 90.7 kg   SpO2 (!) 89%   BMI 27.12 kg/m  General appearance: alert, cooperative, and no distress Head: Normocephalic, without obvious abnormality, atraumatic Resp: clear to auscultation bilaterally Cardio: tachy at 105 GI: soft, non-tender; bowel sounds normal; no masses,  no organomegaly Extremities: extremities normal, atraumatic, no cyanosis or edema and Left AVF with multiple aneurysmal changes, +T/B Dialysis Access:  Dialysis Orders:  TTS - [facility] 4hr, 450/500, EDW 89.5kg, 1K/2.5Ca bath, LUE AVF, heparin 500 unit bolus + 1000/hr heparin infusion - Mircera IV q 2 weeks (did not get his dose 08/02/23) - Venofer 50mg  IV weekly   Assessment/Plan:  Intractable vomiting - improved today.  GI to see patient.  CT scan of abd/pelvis without acute abnormality.  ESRD -   plan for HD today (off schedule) due to only 10 minutes of treatment yesterday and evidence of volume overload and HTN.  Will plan for another session tomorrow.  Hypertension/volume  - he is only 1 kg above edw, but has evidence of volume overload.  Will challenge edw.  Anemia  -  missed micera dose yesterday, will dose with esa today.  Metabolic bone disease -   meds on hold due to N/V  Nutrition - per primary. Hematemesis - GI to see today.  Started on PPI.  Irena Cords, MD [facility], [facility] Pager [phone] 08/03/2023, 9:47 AM

## 2023-08-03 NOTE — Progress Notes (Signed)
Fiance called to check on patient. Update given, no issues or concerns from family at this time.

## 2023-08-03 NOTE — Consult Note (Signed)
Gastroenterology Consult   Referring Provider: No ref. provider found Primary Care Physician:  Rema Fendt, NP Primary Gastroenterologist:  not established   Patient ID: CLEMMIE JEFFS; [id number]; 1986-04-23   Admit date: 08/02/2023  LOS: 1 day   Date of Consultation: 08/03/2023  Reason for Consultation:  vomiting   History of Present Illness   [name] is a 38 y.o. year old male with history of ADHD, diabetes, end-stage renal disease on hemodialysis, high cholesterol, hypertension, hypertrophic cardiomyopathy, seizures who presented to the ED yesterday afternoon with complaints of abdominal pain with associated nausea and vomiting with some concern for coffee-ground emesis as noted by hospitalist during their encounter. Notably patient was also seen on 08/01/2023 for similar symptoms in the ED where workup was unremarkable and he was discharged.GI consulted for further evaluation.  ED course: Hypertensive with blood pressure up to 210/124 WBC 11.6, hemoglobin 9.3 calcium 7.9, anion gap 20, beta hydroxybutyric acid 1.91/0.37 on recheck CT abdomen pelvis with contrast with no acute intra-abdominal abnormalities, cardiomegaly and pulmonary edema in lower lungs slightly increased from prior exam.  Consult: Patient drowsy during encounter, recent phenergan dose per HD RN. Receiving HD during our encounter. Patient is arousible to verbal stimuli and able to answer questions appropriately. Reports nausea/vomiting and mid abdominal pain since this weekend. Denies diarrhea, constipation. Unsure of rectal bleeding or melena given he is visually impaired. Per nursing staff having brown/coffee ground like emesis. He denies ongoing GERD symptoms. Queries if he has food poisoning, denies any other recent sick contacts. Denies NSAIDs, ETOH or tobacco use.   No previous Endoscopic evaluations    Past Medical History:  Diagnosis Date   ADHD (attention deficit hyperactivity disorder)     Diabetes mellitus    ESRD on hemodialysis (HCC)    [facility]   High cholesterol    Hypertension    Hypertrophic cardiomyopathy (HCC) 02/26/2021   Seizure (HCC) 02/08/2021    Past Surgical History:  Procedure Laterality Date   AMPUTATION TOE Right 12/29/2021   Procedure: AMPUTATION TOE, fifth;  Surgeon: Louann Sjogren, DPM;  Location: [facility] OR;  Service: Podiatry;  Laterality: Right;  surgical team will do local block   AV FISTULA PLACEMENT Left 08/11/2020   Procedure: LEFT UPPER EXTREMITY ARTERIOVENOUS (AV) FISTULA CREATION;  Surgeon: Leonie Douglas, MD;  Location: [facility] OR;  Service: Vascular;  Laterality: Left;   FISTULA SUPERFICIALIZATION Left 12/02/2021   Procedure: PLICATION OF LEFT RADIUS CEPHALIC FISTULA;  Surgeon: Leonie Douglas, MD;  Location: [facility] OR;  Service: Vascular;  Laterality: Left;  PERIPHERAL NERVE BLOCK   FRACTURE SURGERY     I & D EXTREMITY Left 07/16/2014   Procedure: IRRIGATION AND DEBRIDEMENT EXTREMITY/LEFT INDEX FINGER;  Surgeon: Betha Loa, MD;  Location: [facility] OR;  Service: Orthopedics;  Laterality: Left;   IR FLUORO GUIDE CV LINE RIGHT  10/04/2022   IR FLUORO GUIDE CV LINE RIGHT  10/05/2022   IR PERC TUN PERIT CATH WO PORT S&I /IMAG  08/07/2020   IR REMOVAL TUN CV CATH W/O FL  10/27/2022   IR THROMBECTOMY AV FISTULA W/THROMBOLYSIS/PTA INC/SHUNT/IMG LEFT Left 10/05/2022   IR US GUIDE VASC ACCESS RIGHT  08/07/2020   IR US GUIDE VASC ACCESS RIGHT  10/04/2022   IR US GUIDE VASC ACCESS RIGHT  10/05/2022   TEE WITHOUT CARDIOVERSION N/A 10/28/2022   Procedure: TRANSESOPHAGEAL ECHOCARDIOGRAM;  Surgeon: Thomasene Ripple, DO;  Location: [facility] INVASIVE CV LAB;  Service: Cardiovascular;  Laterality: N/A;  Prior to Admission medications   Medication Sig Start Date End Date Taking? Authorizing Provider  acetaminophen (TYLENOL) 325 MG tablet Take 2 tablets (650 mg total) by mouth every 6 (six) hours as needed for mild pain (or Fever >/= 101). 12/30/21  Yes Rhetta Mura, MD   atorvastatin (LIPITOR) 40 MG tablet Take 1 tablet (40 mg total) by mouth daily. 01/18/23 09/17/23 Yes Gloris Manchester, MD  calcitRIOL (ROCALTROL) 0.5 MCG capsule Take 2 capsules (1 mcg total) by mouth Every Tuesday,Thursday,and Saturday with dialysis. 12/31/21  Yes Rhetta Mura, MD  carvedilol (COREG) 25 MG tablet Take 1 tablet (25 mg total) by mouth 2 (two) times daily with a meal. 01/18/23 09/17/23 Yes Gloris Manchester, MD  cloNIDine (CATAPRES) 0.1 MG tablet Take 1 tablet (0.1 mg total) by mouth 2 (two) times daily. 01/18/23  Yes Gloris Manchester, MD  diphenhydramine-acetaminophen (TYLENOL PM) 25-500 MG TABS tablet Take 2 tablets by mouth 2 (two) times daily as needed (pain, sleep).   Yes [provider]  insulin glargine-yfgn (SEMGLEE) 100 UNIT/ML injection Inject 0.12 mLs (12 Units total) into the skin at bedtime. 01/18/23  Yes Gloris Manchester, MD  insulin lispro (HUMALOG) 100 UNIT/ML injection Inject 0.01-0.07 mLs (1-7 Units total) into the skin See admin instructions. Per sliding scale 3 times daily with meals 151-200= 1 unit 201-250= 2 units 251-300= 3 units 301-350= 5 units 351-400= 7 units Greater than 400 call md takes only as needed if blood sugar is over 200 01/18/23  Yes Gloris Manchester, MD  metoCLOPramide (REGLAN) 10 MG tablet Take 1 tablet (10 mg total) by mouth every 6 (six) hours for 3 days. 08/01/23 08/04/23 Yes Mikeal Hawthorne, MD  Multiple Vitamins-Minerals (MULTIVITAMIN WITH MINERALS) tablet Take 1 tablet by mouth daily.   Yes [provider]  sodium zirconium cyclosilicate (LOKELMA) 10 g PACK packet Take 10 g by mouth See admin instructions. Take 1 packet (10g) once daily on non-dialysis days (Sunday, Monday, Wednesday, Friday)   Yes [provider]  Insulin Pen Needle (PEN NEEDLES) 31G X 8 MM MISC UAD 07/09/22   Rema Fendt, NP    Current Facility-Administered Medications  Medication Dose Route Frequency Provider Last Rate Last Admin   acetaminophen (TYLENOL) tablet  650 mg  650 mg Oral Q6H PRN Emokpae, Ejiroghene E, MD       Or   acetaminophen (TYLENOL) suppository 650 mg  650 mg Rectal Q6H PRN Emokpae, Ejiroghene E, MD       Chlorhexidine Gluconate Cloth 2 % PADS 6 each  6 each Topical Daily Emokpae, Ejiroghene E, MD   6 each at 08/02/23 2032   Chlorhexidine Gluconate Cloth 2 % PADS 6 each  6 each Topical Q0600 Terrial Rhodes, MD       cloNIDine (CATAPRES - Dosed in mg/24 hr) patch 0.1 mg  0.1 mg Transdermal Weekly Emokpae, Ejiroghene E, MD   0.1 mg at 08/02/23 2142   insulin aspart (novoLOG) injection 0-6 Units  0-6 Units Subcutaneous Q4H Emokpae, Ejiroghene E, MD   1 Units at 08/03/23 0801   nicardipine (CARDENE) 20mg  in 0.86% saline IV infusion (0.1 mg/ml)  3-15 mg/hr Intravenous Continuous Emokpae, Ejiroghene E, MD 30 mL/hr at 08/03/23 0659 3 mg/hr at 08/03/23 0659   Oral care mouth rinse  15 mL Mouth Rinse PRN Emokpae, Ejiroghene E, MD       pantoprazole (PROTONIX) injection 40 mg  40 mg Intravenous Q12H Emokpae, Ejiroghene E, MD   40 mg at 08/03/23 0801  pentafluoroprop-tetrafluoroeth (GEBAUERS) aerosol 1 Application  1 Application Topical PRN Terrial Rhodes, MD       polyethylene glycol (MIRALAX / GLYCOLAX) packet 17 g  17 g Oral Daily PRN Emokpae, Ejiroghene E, MD       promethazine (PHENERGAN) 12.5 mg in sodium chloride 0.9 % 50 mL IVPB  12.5 mg Intravenous Q6H PRN Emokpae, Ejiroghene E, MD 150 mL/hr at 08/03/23 0910 12.5 mg at 08/03/23 0910    Allergies as of 08/02/2023 - Review Complete 08/02/2023  Allergen Reaction Noted   Apresoline [hydralazine] Nausea And Vomiting and Other (See Comments) 03/10/2020   Norvasc [amlodipine] Nausea And Vomiting and Other (See Comments) 03/10/2020    Family History  Problem Relation Age of Onset   Cancer Mother    Stroke Father    Diabetes Father    Hypertension Father     Social History   Socioeconomic History   Marital status: Single    Spouse name: Not on file   Number of children:  Not on file   Years of education: Not on file   Highest education level: Not on file  Occupational History   Occupation: disabled  Tobacco Use   Smoking status: Never    Passive exposure: Never   Smokeless tobacco: Never  Vaping Use   Vaping status: Never Used  Substance and Sexual Activity   Alcohol use: Not Currently    Comment: social drinker   Drug use: No   Sexual activity: Yes  Other Topics Concern   Not on file  Social History Narrative   Not on file   Social Drivers of Health   Financial Resource Strain: Not on file  Food Insecurity: No Food Insecurity (08/02/2023)   Hunger Vital Sign    Worried About Running Out of Food in the Last Year: Never true    Ran Out of Food in the Last Year: Never true  Transportation Needs: No Transportation Needs (08/02/2023)   PRAPARE - Administrator, Civil Service (Medical): No    Lack of Transportation (Non-Medical): No  Physical Activity: Not on file  Stress: Not on file  Social Connections: Unknown (11/11/2021)   [facility]   Social Network    Social Network: Not on file  Intimate Partner Violence: Not At Risk (08/02/2023)   Humiliation, Afraid, Rape, and Kick questionnaire    Fear of Current or Ex-Partner: No    Emotionally Abused: No    Physically Abused: No    Sexually Abused: No     Review of Systems   Gen: Denies any fever, chills, loss of appetite, change in weight or weight loss CV: Denies chest pain, heart palpitations, syncope, edema  Resp: Denies shortness of breath with rest, cough, wheezing, coughing up blood, and pleurisy. GI: +nausea +vomiting +epigastric pain +coffee ground emesis GU : Denies urinary burning, blood in urine, urinary frequency, and urinary incontinence. MS: Denies joint pain, limitation of movement, swelling, cramps, and atrophy.  Derm: Denies rash, itching, dry skin, hives. Psych: Denies depression, anxiety, memory loss, hallucinations, and  confusion. Heme: Denies bruising or bleeding Neuro:  Denies any headaches, dizziness, paresthesias, shaking  Physical Exam   Vital Signs in last 24 hours: Temp:  [97.7 F (36.5 C)-98.7 F (37.1 C)] 98.7 F (37.1 C) (01/22 0812) Pulse Rate:  [86-109] 105 (01/22 0700) Resp:  [7-38] 19 (01/22 0900) BP: (137-221)/(69-138) 165/84 (01/22 0900) SpO2:  [82 %-100 %] 89 % (01/22 0700) Weight:  [90.7 kg] 90.7  kg (01/21 1121) Last BM Date : 08/02/23  General:   Alert,  Well-developed, well-nourished, pleasant and cooperative in NAD Head:  Normocephalic and atraumatic. Eyes:  Sclera clear, no icterus.   Conjunctiva pink. Ears:  Normal auditory acuity. Mouth:  No deformity or lesions, dentition normal. Lungs:  Clear throughout to auscultation.   No wheezes, crackles, or rhonchi. No acute distress. Heart:  Regular rate and rhythm; no murmurs, clicks, rubs,  or gallops. Abdomen:  Soft, nontender and nondistended. No masses, hepatosplenomegaly or hernias noted. Normal bowel sounds, without guarding, and without rebound.   Neurologic:  Alert and  oriented x4. Skin:  Intact without significant lesions or rashes. Psych:  Alert and cooperative. Normal mood and affect.   Labs/Studies   Recent Labs Recent Labs    08/01/23 1815 08/02/23 1120 08/02/23 2118 08/03/23 0506 08/03/23 0845  WBC 9.4 11.6*  --  11.4*  --   HGB 9.5* 9.3* 9.2* 8.4* 8.7*  HCT 30.7* 28.8* 28.6* 26.0* 26.8*  PLT 251 262  --  213  --    BMET Recent Labs    08/01/23 1815 08/02/23 1120 08/03/23 0506  NA 137 139 142  K 4.8 5.1 4.7  CL 96* 94* 99  CO2 21* 18* 23  GLUCOSE 134* 114* 147*  BUN 75* 89* 101*  CREATININE 16.93* 17.99* 20.35*  CALCIUM 7.5* 7.9* 7.2*   LFT Recent Labs    08/01/23 1815 08/02/23 1120  PROT 7.7 8.7*  ALBUMIN 3.9 4.4  AST 12* 18  ALT 10 19  ALKPHOS 48 56  BILITOT 1.1 1.3*   Radiology/Studies CT ABDOMEN PELVIS WO CONTRAST Result Date: 08/02/2023 CLINICAL DATA:  Abdominal pain,  acute, nonlocalized EXAM: CT ABDOMEN AND PELVIS WITHOUT CONTRAST TECHNIQUE: Multidetector CT imaging of the abdomen and pelvis was performed following the standard protocol without IV contrast. RADIATION DOSE REDUCTION: This exam was performed according to the departmental dose-optimization program which includes automated exposure control, adjustment of the mA and/or kV according to patient size and/or use of iterative reconstruction technique. COMPARISON:  CT abdomen/pelvis dated August 01, 2023. FINDINGS: Lower chest: Interlobular septal thickening and increased ground-glass opacities in the bilateral lower lungs, most compatible with pulmonary edema. Cardiomegaly. Hepatobiliary: No suspicious focal hepatic lesion identified, within the limits of an unenhanced exam. Gallbladder is unremarkable. No biliary dilatation. Pancreas: Unremarkable. No pancreatic ductal dilatation or surrounding inflammatory changes. Spleen: Normal in size without focal abnormality. Adrenals/Urinary Tract: Adrenal glands are unremarkable. 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 No hydronephrosis. Bladder is unremarkable given the degree of distention. Stomach/Bowel: Stomach is collapsed limiting detailed evaluation. Appendix appears normal. No evidence of bowel wall thickening, distention, or inflammatory changes. Vascular/Lymphatic: The abdominal aorta is normal in caliber. Branch vascular calcifications. No enlarged abdominal or pelvic lymph nodes. Reproductive: Prostate is unremarkable. Other: No abdominopelvic ascites or intraperitoneal free air. No abdominal wall hernia. Musculoskeletal: No acute or significant osseous findings. IMPRESSION: 1. No acute localizing findings in the abdomen or pelvis. 2. 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 No hydronephrosis. 3. Cardiomegaly with pulmonary edema in the lower lungs, slightly  increased compared to the prior exam. Electronically Signed   By: Hart Robinsons M.D.   On: 08/02/2023 15:03   DG Abdomen Acute W/Chest Result Date: 08/02/2023 CLINICAL DATA:  Abdominal pain EXAM: DG ABDOMEN ACUTE WITH 1 VIEW CHEST COMPARISON:  Chest x-ray 08/01/2023.  CT 08/01/2023. FINDINGS: Enlarged cardiopericardial silhouette with prominence of the  vasculature. Question component of interstitial edema. No pneumothorax or effusion. No consolidation. Overlapping cardiac leads. Overall only minimal bowel gas identified. There is some air in the nondilated stomach. No obvious dilated air-filled loops of bowel. Prominent vascular calcifications. Overlapping cardiac leads. No obvious free air beneath the diaphragm on the upright view. Abdominal images are under penetrated IMPRESSION: Enlarged heart with vascular congestion and question trace edema Minimal bowel gas, nonspecific. No obvious free air dilatation of bowel. Under penetrated abdominal radiographs Electronically Signed   By: Karen Kays M.D.   On: 08/02/2023 12:53   CT ABDOMEN PELVIS WO CONTRAST Result Date: 08/01/2023 CLINICAL DATA:  Left lower quadrant abdominal pain EXAM: CT ABDOMEN AND PELVIS WITHOUT CONTRAST TECHNIQUE: Multidetector CT imaging of the abdomen and pelvis was performed following the standard protocol without IV contrast. RADIATION DOSE REDUCTION: This exam was performed according to the departmental dose-optimization program which includes automated exposure control, adjustment of the mA and/or kV according to patient size and/or use of iterative reconstruction technique. COMPARISON:  01/18/2023 FINDINGS: Lower chest: Interlobular septal thickening and ground-glass opacities in the lower lungs compatible with pulmonary edema. Cardiomegaly. Hepatobiliary: Unremarkable noncontrast appearance of the liver, gallbladder, and biliary tree. Pancreas: Unremarkable. Spleen: Unremarkable. Adrenals/Urinary Tract: Normal adrenal glands.  Horseshoe kidney. Nonobstructing punctate calyceal stones in the left moiety. No hydronephrosis. Nondistended thick-walled bladder. Stomach/Bowel: No bowel obstruction or bowel wall thickening. Stomach and appendix are within normal limits. Vascular/Lymphatic: Aortic atherosclerosis. No enlarged abdominal or pelvic lymph nodes. Reproductive: Unremarkable. Other: Vascular congestion in the mesentery. No free intraperitoneal fluid or air. Musculoskeletal: No acute fracture. IMPRESSION: 1. Bladder wall thickening may be due to underdistention or cystitis. 2. 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 3. Cardiomegaly with pulmonary edema in the lower lungs. Aortic Atherosclerosis (ICD10-I70.0). Electronically Signed   By: Minerva Fester M.D.   On: 08/01/2023 20:26   DG Chest 1 View Result Date: 08/01/2023 CLINICAL DATA:  Chest pain EXAM: CHEST  1 VIEW COMPARISON:  05/22/2023 FINDINGS: Cardiomegaly with mild central congestion. No focal airspace disease, pleural effusion or pneumothorax. IMPRESSION: Cardiomegaly with mild central congestion. Electronically Signed   By: Jasmine Pang M.D.   On: 08/01/2023 18:07    Assessment   MAYJOR MIYAHIRA is a 38 y.o. year old male with history of ADHD, diabetes, end-stage renal disease on hemodialysis, high cholesterol, hypertension, hypertrophic cardiomyopathy, seizures who presented to the ED yesterday afternoon with complaints of abdominal pain with associated nausea and vomiting with some concern for coffee-ground emesis as noted by hospitalist during their encounter.  Nausea/vomiting/questionable coffee-ground emesis/worsening anemia: onset nausea/vomiting and epigastric pain on Sunday. Denies fevers, diarrhea, constipation. Unable to see if he has had rectal bleeding/melena due to visual impairment though staff reported some Coffee ground like emesis since admission. Chronic anemia likely secondary to ESRD/dialysis though  worsening with hgb decline from 9.5 on admission to 8.7 this morning. He denies NSAID use or etoh. Recommend proceeding with EGD for further evaluation as we cannot rule out PUD, gastritis, Duodenitis, mallory weiss tear. Patient currently receiving hemodialysis, remains hypertensive but BP seems to be improving. Would consider reassess in the morning for possible EGD tomorrow.    Plan / Recommendations   PPI twice daily NPO midnight  Plan for EGD tomorrow if clinically stable Anti emetics per hospitalist   08/03/2023, 9:21 AM  Satonya Lux L. Jeanmarie Hubert, MSN, APRN, AGNP-C Adult-Gerontology Nurse Practitioner [facility] at [facility]

## 2023-08-03 NOTE — H&P (View-Only) (Signed)
Gastroenterology Consult   Referring Provider: No ref. provider found Primary Care Physician:  James Fendt, NP Primary Gastroenterologist:  not established   Patient ID: [name]; [id number]; 1986-04-23   Admit date: 08/02/2023  LOS: 1 day   Date of Consultation: 08/03/2023  Reason for Consultation:  vomiting   History of Present Illness   [name] is a 38 y.o. year old male with history of ADHD, diabetes, end-stage renal disease on hemodialysis, high cholesterol, hypertension, hypertrophic cardiomyopathy, seizures who presented to the ED yesterday afternoon with complaints of abdominal pain with associated nausea and vomiting with some concern for coffee-ground emesis as noted by hospitalist during their encounter. Notably patient was also seen on 08/01/2023 for similar symptoms in the ED where workup was unremarkable and he was discharged.GI consulted for further evaluation.  ED course: Hypertensive with blood pressure up to 210/124 WBC 11.6, hemoglobin 9.3 calcium 7.9, anion gap 20, beta hydroxybutyric acid 1.91/0.37 on recheck CT abdomen pelvis with contrast with no acute intra-abdominal abnormalities, cardiomegaly and pulmonary edema in lower lungs slightly increased from prior exam.  Consult: Patient drowsy during encounter, recent phenergan dose per HD RN. Receiving HD during our encounter. Patient is arousible to verbal stimuli and able to answer questions appropriately. Reports nausea/vomiting and mid abdominal pain since this weekend. Denies diarrhea, constipation. Unsure of rectal bleeding or melena given he is visually impaired. Per nursing staff having brown/coffee ground like emesis. He denies ongoing GERD symptoms. Queries if he has food poisoning, denies any other recent sick contacts. Denies NSAIDs, ETOH or tobacco use.   No previous Endoscopic evaluations    Past Medical History:  Diagnosis Date   ADHD (attention deficit hyperactivity disorder)     Diabetes mellitus    ESRD on hemodialysis (HCC)    [facility]   High cholesterol    Hypertension    Hypertrophic cardiomyopathy (HCC) 02/26/2021   Seizure (HCC) 02/08/2021    Past Surgical History:  Procedure Laterality Date   AMPUTATION TOE Right 12/29/2021   Procedure: AMPUTATION TOE, fifth;  Surgeon: Louann Sjogren, DPM;  Location: [facility] OR;  Service: Podiatry;  Laterality: Right;  surgical team will do local block   AV FISTULA PLACEMENT Left 08/11/2020   Procedure: LEFT UPPER EXTREMITY ARTERIOVENOUS (AV) FISTULA CREATION;  Surgeon: Leonie Douglas, MD;  Location: [facility] OR;  Service: Vascular;  Laterality: Left;   FISTULA SUPERFICIALIZATION Left 12/02/2021   Procedure: PLICATION OF LEFT RADIUS CEPHALIC FISTULA;  Surgeon: Leonie Douglas, MD;  Location: [facility] OR;  Service: Vascular;  Laterality: Left;  PERIPHERAL NERVE BLOCK   FRACTURE SURGERY     I & D EXTREMITY Left 07/16/2014   Procedure: IRRIGATION AND DEBRIDEMENT EXTREMITY/LEFT INDEX FINGER;  Surgeon: Betha Loa, MD;  Location: [facility] OR;  Service: Orthopedics;  Laterality: Left;   IR FLUORO GUIDE CV LINE RIGHT  10/04/2022   IR FLUORO GUIDE CV LINE RIGHT  10/05/2022   IR PERC TUN PERIT CATH WO PORT S&I /IMAG  08/07/2020   IR REMOVAL TUN CV CATH W/O FL  10/27/2022   IR THROMBECTOMY AV FISTULA W/THROMBOLYSIS/PTA INC/SHUNT/IMG LEFT Left 10/05/2022   IR US GUIDE VASC ACCESS RIGHT  08/07/2020   IR US GUIDE VASC ACCESS RIGHT  10/04/2022   IR US GUIDE VASC ACCESS RIGHT  10/05/2022   TEE WITHOUT CARDIOVERSION N/A 10/28/2022   Procedure: TRANSESOPHAGEAL ECHOCARDIOGRAM;  Surgeon: Thomasene Ripple, DO;  Location: [facility] INVASIVE CV LAB;  Service: Cardiovascular;  Laterality: N/A;  Prior to Admission medications   Medication Sig Start Date End Date Taking? Authorizing Provider  acetaminophen (TYLENOL) 325 MG tablet Take 2 tablets (650 mg total) by mouth every 6 (six) hours as needed for mild pain (or Fever >/= 101). 12/30/21  Yes Rhetta Mura, MD   atorvastatin (LIPITOR) 40 MG tablet Take 1 tablet (40 mg total) by mouth daily. 01/18/23 09/17/23 Yes Gloris Manchester, MD  calcitRIOL (ROCALTROL) 0.5 MCG capsule Take 2 capsules (1 mcg total) by mouth Every Tuesday,Thursday,and Saturday with dialysis. 12/31/21  Yes Rhetta Mura, MD  carvedilol (COREG) 25 MG tablet Take 1 tablet (25 mg total) by mouth 2 (two) times daily with a meal. 01/18/23 09/17/23 Yes Gloris Manchester, MD  cloNIDine (CATAPRES) 0.1 MG tablet Take 1 tablet (0.1 mg total) by mouth 2 (two) times daily. 01/18/23  Yes Gloris Manchester, MD  diphenhydramine-acetaminophen (TYLENOL PM) 25-500 MG TABS tablet Take 2 tablets by mouth 2 (two) times daily as needed (pain, sleep).   Yes [provider]  insulin glargine-yfgn (SEMGLEE) 100 UNIT/ML injection Inject 0.12 mLs (12 Units total) into the skin at bedtime. 01/18/23  Yes Gloris Manchester, MD  insulin lispro (HUMALOG) 100 UNIT/ML injection Inject 0.01-0.07 mLs (1-7 Units total) into the skin See admin instructions. Per sliding scale 3 times daily with meals 151-200= 1 unit 201-250= 2 units 251-300= 3 units 301-350= 5 units 351-400= 7 units Greater than 400 call md takes only as needed if blood sugar is over 200 01/18/23  Yes Gloris Manchester, MD  metoCLOPramide (REGLAN) 10 MG tablet Take 1 tablet (10 mg total) by mouth every 6 (six) hours for 3 days. 08/01/23 08/04/23 Yes Mikeal Hawthorne, MD  Multiple Vitamins-Minerals (MULTIVITAMIN WITH MINERALS) tablet Take 1 tablet by mouth daily.   Yes [provider]  sodium zirconium cyclosilicate (LOKELMA) 10 g PACK packet Take 10 g by mouth See admin instructions. Take 1 packet (10g) once daily on non-dialysis days (Sunday, Monday, Wednesday, Friday)   Yes [provider]  Insulin Pen Needle (PEN NEEDLES) 31G X 8 MM MISC UAD 07/09/22   James Fendt, NP    Current Facility-Administered Medications  Medication Dose Route Frequency Provider Last Rate Last Admin   acetaminophen (TYLENOL) tablet  650 mg  650 mg Oral Q6H PRN Emokpae, Ejiroghene E, MD       Or   acetaminophen (TYLENOL) suppository 650 mg  650 mg Rectal Q6H PRN Emokpae, Ejiroghene E, MD       Chlorhexidine Gluconate Cloth 2 % PADS 6 each  6 each Topical Daily Emokpae, Ejiroghene E, MD   6 each at 08/02/23 2032   Chlorhexidine Gluconate Cloth 2 % PADS 6 each  6 each Topical Q0600 Terrial Rhodes, MD       cloNIDine (CATAPRES - Dosed in mg/24 hr) patch 0.1 mg  0.1 mg Transdermal Weekly Emokpae, Ejiroghene E, MD   0.1 mg at 08/02/23 2142   insulin aspart (novoLOG) injection 0-6 Units  0-6 Units Subcutaneous Q4H Emokpae, Ejiroghene E, MD   1 Units at 08/03/23 0801   nicardipine (CARDENE) 20mg  in 0.86% saline IV infusion (0.1 mg/ml)  3-15 mg/hr Intravenous Continuous Emokpae, Ejiroghene E, MD 30 mL/hr at 08/03/23 0659 3 mg/hr at 08/03/23 0659   Oral care mouth rinse  15 mL Mouth Rinse PRN Emokpae, Ejiroghene E, MD       pantoprazole (PROTONIX) injection 40 mg  40 mg Intravenous Q12H Emokpae, Ejiroghene E, MD   40 mg at 08/03/23 0801  pentafluoroprop-tetrafluoroeth (GEBAUERS) aerosol 1 Application  1 Application Topical PRN Terrial Rhodes, MD       polyethylene glycol (MIRALAX / GLYCOLAX) packet 17 g  17 g Oral Daily PRN Emokpae, Ejiroghene E, MD       promethazine (PHENERGAN) 12.5 mg in sodium chloride 0.9 % 50 mL IVPB  12.5 mg Intravenous Q6H PRN Emokpae, Ejiroghene E, MD 150 mL/hr at 08/03/23 0910 12.5 mg at 08/03/23 0910    Allergies as of 08/02/2023 - Review Complete 08/02/2023  Allergen Reaction Noted   Apresoline [hydralazine] Nausea And Vomiting and Other (See Comments) 03/10/2020   Norvasc [amlodipine] Nausea And Vomiting and Other (See Comments) 03/10/2020    Family History  Problem Relation Age of Onset   Cancer Mother    Stroke Father    Diabetes Father    Hypertension Father     Social History   Socioeconomic History   Marital status: Single    Spouse name: Not on file   Number of children:  Not on file   Years of education: Not on file   Highest education level: Not on file  Occupational History   Occupation: disabled  Tobacco Use   Smoking status: Never    Passive exposure: Never   Smokeless tobacco: Never  Vaping Use   Vaping status: Never Used  Substance and Sexual Activity   Alcohol use: Not Currently    Comment: social drinker   Drug use: No   Sexual activity: Yes  Other Topics Concern   Not on file  Social History Narrative   Not on file   Social Drivers of Health   Financial Resource Strain: Not on file  Food Insecurity: No Food Insecurity (08/02/2023)   Hunger Vital Sign    Worried About Running Out of Food in the Last Year: Never true    Ran Out of Food in the Last Year: Never true  Transportation Needs: No Transportation Needs (08/02/2023)   PRAPARE - Administrator, Civil Service (Medical): No    Lack of Transportation (Non-Medical): No  Physical Activity: Not on file  Stress: Not on file  Social Connections: Unknown (11/11/2021)   [facility]   Social Network    Social Network: Not on file  Intimate Partner Violence: Not At Risk (08/02/2023)   Humiliation, Afraid, Rape, and Kick questionnaire    Fear of Current or Ex-Partner: No    Emotionally Abused: No    Physically Abused: No    Sexually Abused: No     Review of Systems   Gen: Denies any fever, chills, loss of appetite, change in weight or weight loss CV: Denies chest pain, heart palpitations, syncope, edema  Resp: Denies shortness of breath with rest, cough, wheezing, coughing up blood, and pleurisy. GI: +nausea +vomiting +epigastric pain +coffee ground emesis GU : Denies urinary burning, blood in urine, urinary frequency, and urinary incontinence. MS: Denies joint pain, limitation of movement, swelling, cramps, and atrophy.  Derm: Denies rash, itching, dry skin, hives. Psych: Denies depression, anxiety, memory loss, hallucinations, and  confusion. Heme: Denies bruising or bleeding Neuro:  Denies any headaches, dizziness, paresthesias, shaking  Physical Exam   Vital Signs in last 24 hours: Temp:  [97.7 F (36.5 C)-98.7 F (37.1 C)] 98.7 F (37.1 C) (01/22 0812) Pulse Rate:  [86-109] 105 (01/22 0700) Resp:  [7-38] 19 (01/22 0900) BP: (137-221)/(69-138) 165/84 (01/22 0900) SpO2:  [82 %-100 %] 89 % (01/22 0700) Weight:  [90.7 kg] 90.7  kg (01/21 1121) Last BM Date : 08/02/23  General:   Alert,  Well-developed, well-nourished, pleasant and cooperative in NAD Head:  Normocephalic and atraumatic. Eyes:  Sclera clear, no icterus.   Conjunctiva pink. Ears:  Normal auditory acuity. Mouth:  No deformity or lesions, dentition normal. Lungs:  Clear throughout to auscultation.   No wheezes, crackles, or rhonchi. No acute distress. Heart:  Regular rate and rhythm; no murmurs, clicks, rubs,  or gallops. Abdomen:  Soft, nontender and nondistended. No masses, hepatosplenomegaly or hernias noted. Normal bowel sounds, without guarding, and without rebound.   Neurologic:  Alert and  oriented x4. Skin:  Intact without significant lesions or rashes. Psych:  Alert and cooperative. Normal mood and affect.   Labs/Studies   Recent Labs Recent Labs    08/01/23 1815 08/02/23 1120 08/02/23 2118 08/03/23 0506 08/03/23 0845  WBC 9.4 11.6*  --  11.4*  --   HGB 9.5* 9.3* 9.2* 8.4* 8.7*  HCT 30.7* 28.8* 28.6* 26.0* 26.8*  PLT 251 262  --  213  --    BMET Recent Labs    08/01/23 1815 08/02/23 1120 08/03/23 0506  NA 137 139 142  K 4.8 5.1 4.7  CL 96* 94* 99  CO2 21* 18* 23  GLUCOSE 134* 114* 147*  BUN 75* 89* 101*  CREATININE 16.93* 17.99* 20.35*  CALCIUM 7.5* 7.9* 7.2*   LFT Recent Labs    08/01/23 1815 08/02/23 1120  PROT 7.7 8.7*  ALBUMIN 3.9 4.4  AST 12* 18  ALT 10 19  ALKPHOS 48 56  BILITOT 1.1 1.3*   Radiology/Studies CT ABDOMEN PELVIS WO CONTRAST Result Date: 08/02/2023 CLINICAL DATA:  Abdominal pain,  acute, nonlocalized EXAM: CT ABDOMEN AND PELVIS WITHOUT CONTRAST TECHNIQUE: Multidetector CT imaging of the abdomen and pelvis was performed following the standard protocol without IV contrast. RADIATION DOSE REDUCTION: This exam was performed according to the departmental dose-optimization program which includes automated exposure control, adjustment of the mA and/or kV according to patient size and/or use of iterative reconstruction technique. COMPARISON:  CT abdomen/pelvis dated August 01, 2023. FINDINGS: Lower chest: Interlobular septal thickening and increased ground-glass opacities in the bilateral lower lungs, most compatible with pulmonary edema. Cardiomegaly. Hepatobiliary: No suspicious focal hepatic lesion identified, within the limits of an unenhanced exam. Gallbladder is unremarkable. No biliary dilatation. Pancreas: Unremarkable. No pancreatic ductal dilatation or surrounding inflammatory changes. Spleen: Normal in size without focal abnormality. Adrenals/Urinary Tract: Adrenal glands are unremarkable. 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 No hydronephrosis. Bladder is unremarkable given the degree of distention. Stomach/Bowel: Stomach is collapsed limiting detailed evaluation. Appendix appears normal. No evidence of bowel wall thickening, distention, or inflammatory changes. Vascular/Lymphatic: The abdominal aorta is normal in caliber. Branch vascular calcifications. No enlarged abdominal or pelvic lymph nodes. Reproductive: Prostate is unremarkable. Other: No abdominopelvic ascites or intraperitoneal free air. No abdominal wall hernia. Musculoskeletal: No acute or significant osseous findings. IMPRESSION: 1. No acute localizing findings in the abdomen or pelvis. 2. 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 No hydronephrosis. 3. Cardiomegaly with pulmonary edema in the lower lungs, slightly  increased compared to the prior exam. Electronically Signed   By: Hart Robinsons M.D.   On: 08/02/2023 15:03   DG Abdomen Acute W/Chest Result Date: 08/02/2023 CLINICAL DATA:  Abdominal pain EXAM: DG ABDOMEN ACUTE WITH 1 VIEW CHEST COMPARISON:  Chest x-ray 08/01/2023.  CT 08/01/2023. FINDINGS: Enlarged cardiopericardial silhouette with prominence of the  vasculature. Question component of interstitial edema. No pneumothorax or effusion. No consolidation. Overlapping cardiac leads. Overall only minimal bowel gas identified. There is some air in the nondilated stomach. No obvious dilated air-filled loops of bowel. Prominent vascular calcifications. Overlapping cardiac leads. No obvious free air beneath the diaphragm on the upright view. Abdominal images are under penetrated IMPRESSION: Enlarged heart with vascular congestion and question trace edema Minimal bowel gas, nonspecific. No obvious free air dilatation of bowel. Under penetrated abdominal radiographs Electronically Signed   By: Karen Kays M.D.   On: 08/02/2023 12:53   CT ABDOMEN PELVIS WO CONTRAST Result Date: 08/01/2023 CLINICAL DATA:  Left lower quadrant abdominal pain EXAM: CT ABDOMEN AND PELVIS WITHOUT CONTRAST TECHNIQUE: Multidetector CT imaging of the abdomen and pelvis was performed following the standard protocol without IV contrast. RADIATION DOSE REDUCTION: This exam was performed according to the departmental dose-optimization program which includes automated exposure control, adjustment of the mA and/or kV according to patient size and/or use of iterative reconstruction technique. COMPARISON:  01/18/2023 FINDINGS: Lower chest: Interlobular septal thickening and ground-glass opacities in the lower lungs compatible with pulmonary edema. Cardiomegaly. Hepatobiliary: Unremarkable noncontrast appearance of the liver, gallbladder, and biliary tree. Pancreas: Unremarkable. Spleen: Unremarkable. Adrenals/Urinary Tract: Normal adrenal glands.  Horseshoe kidney. Nonobstructing punctate calyceal stones in the left moiety. No hydronephrosis. Nondistended thick-walled bladder. Stomach/Bowel: No bowel obstruction or bowel wall thickening. Stomach and appendix are within normal limits. Vascular/Lymphatic: Aortic atherosclerosis. No enlarged abdominal or pelvic lymph nodes. Reproductive: Unremarkable. Other: Vascular congestion in the mesentery. No free intraperitoneal fluid or air. Musculoskeletal: No acute fracture. IMPRESSION: 1. Bladder wall thickening may be due to underdistention or cystitis. 2. 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 3. Cardiomegaly with pulmonary edema in the lower lungs. Aortic Atherosclerosis (ICD10-I70.0). Electronically Signed   By: Minerva Fester M.D.   On: 08/01/2023 20:26   DG Chest 1 View Result Date: 08/01/2023 CLINICAL DATA:  Chest pain EXAM: CHEST  1 VIEW COMPARISON:  05/22/2023 FINDINGS: Cardiomegaly with mild central congestion. No focal airspace disease, pleural effusion or pneumothorax. IMPRESSION: Cardiomegaly with mild central congestion. Electronically Signed   By: Jasmine Pang M.D.   On: 08/01/2023 18:07    Assessment   MAYJOR MIYAHIRA is a 38 y.o. year old male with history of ADHD, diabetes, end-stage renal disease on hemodialysis, high cholesterol, hypertension, hypertrophic cardiomyopathy, seizures who presented to the ED yesterday afternoon with complaints of abdominal pain with associated nausea and vomiting with some concern for coffee-ground emesis as noted by hospitalist during their encounter.  Nausea/vomiting/questionable coffee-ground emesis/worsening anemia: onset nausea/vomiting and epigastric pain on Sunday. Denies fevers, diarrhea, constipation. Unable to see if he has had rectal bleeding/melena due to visual impairment though staff reported some Coffee ground like emesis since admission. Chronic anemia likely secondary to ESRD/dialysis though  worsening with hgb decline from 9.5 on admission to 8.7 this morning. He denies NSAID use or etoh. Recommend proceeding with EGD for further evaluation as we cannot rule out PUD, gastritis, Duodenitis, mallory weiss tear. Patient currently receiving hemodialysis, remains hypertensive but BP seems to be improving. Would consider reassess in the morning for possible EGD tomorrow.    Plan / Recommendations   PPI twice daily NPO midnight  Plan for EGD tomorrow if clinically stable Anti emetics per hospitalist   08/03/2023, 9:21 AM  Satonya Lux L. Jeanmarie Hubert, MSN, APRN, AGNP-C Adult-Gerontology Nurse Practitioner [facility]

## 2023-08-03 NOTE — Plan of Care (Signed)
  Problem: Education: Goal: Knowledge of General Education information will improve Description: Including pain rating scale, medication(s)/side effects and non-pharmacologic comfort measures Outcome: Progressing   Problem: Health Behavior/Discharge Planning: Goal: Ability to manage health-related needs will improve Outcome: Progressing   Problem: Clinical Measurements: Goal: Ability to maintain clinical measurements within normal limits will improve Outcome: Progressing Goal: Will remain free from infection Outcome: Progressing Goal: Diagnostic test results will improve Outcome: Progressing Goal: Respiratory complications will improve Outcome: Progressing Goal: Cardiovascular complication will be avoided Outcome: Progressing   Problem: Activity: Goal: Risk for activity intolerance will decrease Outcome: Not Progressing   Problem: Nutrition: Goal: Adequate nutrition will be maintained Outcome: Not Progressing   Problem: Coping: Goal: Level of anxiety will decrease Outcome: Progressing   Problem: Elimination: Goal: Will not experience complications related to bowel motility Outcome: Progressing Goal: Will not experience complications related to urinary retention Outcome: Progressing   Problem: Pain Managment: Goal: General experience of comfort will improve and/or be controlled Outcome: Progressing   Problem: Safety: Goal: Ability to remain free from injury will improve Outcome: Not Progressing   Problem: Skin Integrity: Goal: Risk for impaired skin integrity will decrease Outcome: Progressing   Problem: Education: Goal: Ability to describe self-care measures that may prevent or decrease complications (Diabetes Survival Skills Education) will improve Outcome: Progressing Goal: Individualized Educational Video(s) Outcome: Progressing   Problem: Coping: Goal: Ability to adjust to condition or change in health will improve Outcome: Progressing   Problem: Fluid  Volume: Goal: Ability to maintain a balanced intake and output will improve Outcome: Progressing   Problem: Health Behavior/Discharge Planning: Goal: Ability to identify and utilize available resources and services will improve Outcome: Progressing Goal: Ability to manage health-related needs will improve Outcome: Progressing   Problem: Metabolic: Goal: Ability to maintain appropriate glucose levels will improve Outcome: Progressing   Problem: Nutritional: Goal: Maintenance of adequate nutrition will improve Outcome: Not Progressing Goal: Progress toward achieving an optimal weight will improve Outcome: Not Progressing   Problem: Skin Integrity: Goal: Risk for impaired skin integrity will decrease Outcome: Progressing

## 2023-08-03 NOTE — TOC Initial Note (Signed)
Transition of Care [facility]) - Initial/Assessment Note    Patient Details  Name: [name] MRN: [id number] Date of Birth: 1985-09-29  Transition of Care [facility]) CM/SW Contact:    Karn Cassis, LCSW Phone Number: 08/03/2023, 8:26 AM  Clinical Narrative:  Pt admitted for intractable vomiting. Assessment completed due to high risk readmission score. Pt lives with 39 year old son and his girlfriend is there several days a week. He is independent with ADLs. Pt's dialysis is Tuesday, Thursday, Saturday schedule at [facility]. No needs reported at this time. TOC will follow.                  Expected Discharge Plan: Home/Self Care Barriers to Discharge: Continued Medical Work up   Patient Goals and CMS Choice Patient states their goals for this hospitalization and ongoing recovery are:: return home          Expected Discharge Plan and Services In-house Referral: Clinical Social Work     Living arrangements for the past 2 months: Single Family Home                                      Prior Living Arrangements/Services Living arrangements for the past 2 months: Single Family Home Lives with:: Minor Children Patient language and need for interpreter reviewed:: Yes Do you feel safe going back to the place where you live?: Yes      Need for Family Participation in Patient Care: No (Comment)     Criminal Activity/Legal Involvement Pertinent to Current Situation/Hospitalization: No - Comment as needed  Activities of Daily Living   ADL Screening (condition at time of admission) Independently performs ADLs?: Yes (appropriate for developmental age) Is the patient deaf or have difficulty hearing?: No Does the patient have difficulty seeing, even when wearing glasses/contacts?: No Does the patient have difficulty concentrating, remembering, or making decisions?: No  Permission Sought/Granted                  Emotional Assessment         Alcohol  / Substance Use: Not Applicable Psych Involvement: No (comment)  Admission diagnosis:  Hypoxia [R09.02] Intractable vomiting [R11.10] Nausea and vomiting, unspecified vomiting type [R11.2] Patient Active Problem List   Diagnosis Date Noted   Intractable vomiting 08/02/2023   Hematemesis 08/02/2023   MSSA bacteremia 10/26/2022   Hemodialysis catheter infection (HCC) 10/26/2022   Acute on chronic diastolic CHF (congestive heart failure) (HCC) 10/04/2022   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) 12/29/2021   Status post surgery 12/29/2021   Hypoglycemia 10/30/2021   Acute hypoxic respiratory failure (HCC) 03/12/2021   ESRD (end stage renal disease) on dialysis (HCC) 03/12/2021   Hypertrophic cardiomyopathy (HCC) 02/26/2021   Syncope 02/08/2021   DKA (diabetic ketoacidosis) (HCC) 02/08/2021   Hyperglycemia    Diabetic foot ulcer (HCC) 11/24/2020   Hypervolemia associated with renal insufficiency 08/07/2020   Uremia 08/07/2020   Metabolic acidosis 08/07/2020   Nausea and vomiting 05/07/2020   Dehydration    Hypertensive urgency 02/25/2020   Anemia 12/02/2019   Essential hypertension 12/02/2019   SOB (shortness of breath) 12/02/2019   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) 12/02/2019   Diarrhea 03/23/2017   Early satiety 03/23/2017   Generalized abdominal pain 03/23/2017   PCP:  Rema Fendt, NP Pharmacy:  [facility] #[id number] [place], [place] - [address] AT [facility] [address] [place] [address] Phone: [phone] Fax: [phone]  [facility] #[id number] [place] Otto, [place] - [address] AT [facility] OF [address] & [address] [place] [address] Phone: [phone] Fax: [phone]  [facility] [place], [place] - [address], [address], [address] [place]  [address] Phone: [phone] Fax: [phone]     Social Drivers of Health (SDOH) Social History: SDOH Screenings   Food Insecurity: No Food Insecurity (08/02/2023)  Housing: Low Risk  (08/02/2023)  Transportation Needs: No Transportation Needs (08/02/2023)  Utilities: Not At Risk (08/02/2023)  Depression (PHQ2-9): Low Risk  (10/20/2022)  Social Connections: Unknown (11/11/2021)   Received from [facility]  Tobacco Use: Low Risk  (08/02/2023)   SDOH Interventions:     Readmission Risk Interventions    08/03/2023    8:25 AM 03/05/2021    9:41 AM 11/27/2020    3:16 PM  Readmission Risk Prevention Plan  Transportation Screening Complete Complete Complete  HRI or Home Care Consult Complete    Social Work Consult for Recovery Care Planning/Counseling Complete    Palliative Care Screening Not Applicable    Medication Review Oceanographer) Complete Complete Complete  PCP or Specialist appointment within 3-5 days of discharge  Complete Complete  HRI or Home Care Consult  Complete Complete  SW Recovery Care/Counseling Consult  Complete Complete  Palliative Care Screening  Not Applicable Not Applicable  Skilled Nursing Facility  Complete Not Applicable

## 2023-08-03 NOTE — Progress Notes (Signed)
Beta hydroxy butyrate mildly elevated at 1.9. Per notes he is a type 2 diabetic. Serum glucose in the 130s. With chest imagining findings concerning for pulmonary edema and requiring O2, also ESRD, held off on d5 fluids and insulin. Will repeat BHB in a.m and check Ph on vbg.   E.Tish Begin, MD. TRH.

## 2023-08-03 NOTE — Progress Notes (Signed)
Patient drank juice from personal bag, shortly after patient began heaving, vomiting and moaning in discomfort. Attempted multiple times to educate patient to allow his stomach and throat to rest by not putting anything in his system that is going to trigger vomiting. Patient did not allow opportunity for education and states "listen, I'm the one going through this, just give me what I want" " my mouth is dry and my lips are dry"   Provided patient with minimal ice water with swabs to swab his mouth, and provided lip moisturizer to patient.

## 2023-08-03 NOTE — Plan of Care (Signed)
   Problem: Health Behavior/Discharge Planning: Goal: Ability to manage health-related needs will improve Outcome: Progressing   Problem: Education: Goal: Knowledge of General Education information will improve Description: Including pain rating scale, medication(s)/side effects and non-pharmacologic comfort measures Outcome: Progressing   Problem: Clinical Measurements: Goal: Ability to maintain clinical measurements within normal limits will improve Outcome: Progressing   Problem: Clinical Measurements: Goal: Will remain free from infection Outcome: Progressing   Problem: Clinical Measurements: Goal: Diagnostic test results will improve Outcome: Progressing

## 2023-08-03 NOTE — Progress Notes (Signed)
PROGRESS NOTE    [name]  ZYS:[id number] DOB: [birth year] DOA: 08/02/2023 PCP: Rema Fendt, NP   Brief Narrative:    [name] is a 38 y.o. male with medical history significant for ESRD, diabetes mellitus, hypertrophic cardiomyopathy, congestive heart failure, blindness. Patient presented to the ED with complaints of multiple episodes of vomiting that started yesterday.  Patient was admitted for hematemesis in the setting of intractable nausea and vomiting and is also noted to have hypertensive urgency as well as acute on chronic diastolic CHF exacerbation in the setting of ESRD on HD.  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  Possibly secondary to gastroparesis, considering he is diabetic, blind, and with renal failure. -QT prolonged at 506 -IV Phenergan -Trial of 0.5 mg of IV Ativan -Clear liquid diet -Appreciate GI evaluation -IV PPI twice daily and stable hemoglobin levels noted -SCDs for DVT prophylaxis     Acute hypoxic respiratory failure (HCC) O2 sat dropped to 82% on room air status post 0.5 mg of Dilaudid.  Later ambulated sats 87%.  Chest imaging with pulmonary edema. -HD planned per nephrology 1/22 in 1/23   Hypertensive urgency Blood pressure persistently elevated up to 210/124, minimal response to IV labetalol 10 mg.  He is on clonidine 0.1 mg which she reports compliance with.  Likely component of rebound hypertension, do not able to keep his medications down. - IV Labetalol 20 mg x 1 (  Allergy to hydralazine) -Resume 0.1 mg clonidine as patch -Hold carvedilol 25 mg twice daily with vomiting -Continue Cardene drip -Should improve after hemodialysis   Acute on chronic diastolic CHF (congestive heart failure) (HCC) Chest imaging showing pulmonary edema, last echo 10/2022, EF 55 to 60%. -Hemodialysis planned as above   ESRD (end stage renal disease) on dialysis [facility]) On dialysis Tuesday Thursday Saturday, reports he has not missed any HD session.  Was 10 minutes into his HD session today when he was sent to the ED.  Elevated BUN at 89.  Potassium 5.1.  Blood pressure elevated. 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 On long-acting insulin 6 units nightly. -Continue every 4 hours SSI and continue monitoring -Cannot give IV fluid   DVT prophylaxis:SCDs Code Status: Full Family Communication: None at bedside Disposition Plan:  Status is: Inpatient Remains inpatient appropriate because: Need for IV medications and inpatient evaluation.   Consultants:  Nephrology GI  Procedures:  None  Antimicrobials:  None   Subjective: Patient seen and evaluated today and appears very nauseous and has hiccups as well as some anxiety.  Objective: Vitals:   08/03/23 1023 08/03/23 1043 08/03/23 1049 08/03/23 1100  BP: (!) 167/95 (!) 174/91 (!) 172/103 (!) 173/93  Pulse: (!) 104 (!) 103 (!) 106 (!) 102  Resp: 18 17 20  (!) 22  Temp: 98.7 F (37.1 C)     TempSrc:      SpO2: 94% 95% 96% 97%  Weight:  90.7 kg     Height:        Intake/Output Summary (Last 24 hours) at 08/03/2023 1101 Last data filed at 08/03/2023 0659 Gross per 24 hour  Intake 293.3 ml  Output 1 ml  Net 292.3 ml   Filed Weights   08/02/23 1121 08/03/23 1023  Weight: 90.7 kg 90.7 kg    Examination:  General exam: Appears to be in mild to moderate distress Respiratory  system: Clear to auscultation. Respiratory effort normal. Cardiovascular system: S1 & S2 heard, RRR.  Gastrointestinal system: Abdomen is soft Central nervous system: Alert and awake Extremities: No edema Skin: No significant lesions noted    Data Reviewed: I have personally reviewed following labs and imaging studies  CBC: Recent Labs  Lab 08/01/23 1815 08/02/23 1120 08/02/23 2118 08/03/23 0506 08/03/23 0845  WBC 9.4 11.6*  --  11.4*  --   NEUTROABS 8.5* 10.6*  --   --   --   HGB 9.5* 9.3* 9.2* 8.4* 8.7*  HCT 30.7* 28.8* 28.6* 26.0* 26.8*  MCV 85.8 85.0  --  84.1  --   PLT 251 262  --  213  --    Basic Metabolic Panel: Recent Labs  Lab 08/01/23 1815 08/02/23 1120 08/02/23 2116 08/03/23 0506  NA 137 139  --  142  K 4.8 5.1  --  4.7  CL 96* 94*  --  99  CO2 21* 18*  --  23  GLUCOSE 134* 114*  --  147*  BUN 75* 89*  --  101*  CREATININE 16.93* 17.99*  --  20.35*  CALCIUM 7.5* 7.9*  --  7.2*  MG 2.4  --  2.5*  --    GFR: Estimated Creatinine Clearance: 5.4 mL/min (A) (by C-G formula based on SCr of 20.35 mg/dL (H)). Liver Function Tests: Recent Labs  Lab 08/01/23 1815 08/02/23 1120  AST 12* 18  ALT 10 19  ALKPHOS 48 56  BILITOT 1.1 1.3*  PROT 7.7 8.7*  ALBUMIN 3.9 4.4   Recent Labs  Lab 08/01/23 1815  LIPASE 24   No results for input(s): "AMMONIA" in the last 168 hours. Coagulation Profile: No results for input(s): "INR", "PROTIME" in the last 168 hours. Cardiac Enzymes: No results for input(s): "CKTOTAL", "CKMB", "CKMBINDEX", "TROPONINI" in the last 168 hours. BNP (last 3 results) No results for input(s): "PROBNP" in the last 8760 hours. HbA1C: Recent Labs    08/01/23 2329  HGBA1C 6.0*   CBG: Recent Labs  Lab 08/02/23 2118 08/03/23 0005 08/03/23 0357 08/03/23 0747  GLUCAP 137* 124* 154* 153*   Lipid Profile: No results for input(s): "CHOL", "HDL", "LDLCALC", "TRIG", "CHOLHDL", "LDLDIRECT" in the last 72 hours. Thyroid Function  Tests: No results for input(s): "TSH", "T4TOTAL", "FREET4", "T3FREE", "THYROIDAB" in the last 72 hours. Anemia Panel: No results for input(s): "VITAMINB12", "FOLATE", "FERRITIN", "TIBC", "IRON", "RETICCTPCT" in the last 72 hours. Sepsis Labs: Recent Labs  Lab 08/01/23 1824 08/01/23 2101  LATICACIDVEN 1.8 2.1*    Recent Results (from the past 240 hours)  MRSA Next Gen by PCR, Nasal     Status: None   Collection Time: 08/02/23  8:32 PM   Specimen: Nasal Mucosa; Nasal Swab  Result Value Ref Range Status   MRSA by PCR Next Gen NOT DETECTED NOT DETECTED Final    Comment: (NOTE) The GeneXpert MRSA Assay (FDA approved for NASAL specimens only), is one component of a comprehensive MRSA colonization surveillance program. It is not intended to diagnose MRSA infection  nor to guide or monitor treatment for MRSA infections. Test performance is not FDA approved in patients less than 22 years old. Performed at [facility], [address] [place], [place] [address]          Radiology Studies: CT ABDOMEN PELVIS WO CONTRAST Result Date: 08/02/2023 CLINICAL DATA:  Abdominal pain, acute, nonlocalized EXAM: CT ABDOMEN AND PELVIS WITHOUT CONTRAST TECHNIQUE: Multidetector CT imaging of the abdomen and pelvis was performed following the standard protocol without IV contrast. RADIATION DOSE REDUCTION: This exam was performed according to the departmental dose-optimization program which includes automated exposure control, adjustment of the mA and/or kV according to patient size and/or use of iterative reconstruction technique. COMPARISON:  CT abdomen/pelvis dated August 01, 2023. FINDINGS: Lower chest: Interlobular septal thickening and increased ground-glass opacities in the bilateral lower lungs, most compatible with pulmonary edema. Cardiomegaly. Hepatobiliary: No suspicious focal hepatic lesion identified, within the limits of an unenhanced exam. Gallbladder is unremarkable. No biliary dilatation.  Pancreas: Unremarkable. No pancreatic ductal dilatation or surrounding inflammatory changes. Spleen: Normal in size without focal abnormality. Adrenals/Urinary Tract: Adrenal glands are unremarkable. 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 No hydronephrosis. Bladder is unremarkable given the degree of distention. Stomach/Bowel: Stomach is collapsed limiting detailed evaluation. Appendix appears normal. No evidence of bowel wall thickening, distention, or inflammatory changes. Vascular/Lymphatic: The abdominal aorta is normal in caliber. Branch vascular calcifications. No enlarged abdominal or pelvic lymph nodes. Reproductive: Prostate is unremarkable. Other: No abdominopelvic ascites or intraperitoneal free air. No abdominal wall hernia. Musculoskeletal: No acute or significant osseous findings. IMPRESSION: 1. No acute localizing findings in the abdomen or pelvis. 2. 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 No hydronephrosis. 3. Cardiomegaly with pulmonary edema in the lower lungs, slightly increased compared to the prior exam. Electronically Signed   By: Hart Robinsons M.D.   On: 08/02/2023 15:03   DG Abdomen Acute W/Chest Result Date: 08/02/2023 CLINICAL DATA:  Abdominal pain EXAM: DG ABDOMEN ACUTE WITH 1 VIEW CHEST COMPARISON:  Chest x-ray 08/01/2023.  CT 08/01/2023. FINDINGS: Enlarged cardiopericardial silhouette with prominence of the vasculature. Question component of interstitial edema. No pneumothorax or effusion. No consolidation. Overlapping cardiac leads. Overall only minimal bowel gas identified. There is some air in the nondilated stomach. No obvious dilated air-filled loops of bowel. Prominent vascular calcifications. Overlapping cardiac leads. No obvious free air beneath the diaphragm on the upright view. Abdominal images are under penetrated IMPRESSION: Enlarged heart with vascular  congestion and question trace edema Minimal bowel gas, nonspecific. No obvious free air dilatation of bowel. Under penetrated abdominal radiographs Electronically Signed   By: Karen Kays M.D.   On: 08/02/2023 12:53   CT ABDOMEN PELVIS WO CONTRAST Result Date: 08/01/2023 CLINICAL DATA:  Left lower quadrant abdominal pain EXAM: CT ABDOMEN AND PELVIS WITHOUT CONTRAST TECHNIQUE: Multidetector CT imaging of the abdomen and pelvis was performed following the standard protocol without IV contrast. RADIATION DOSE REDUCTION: This exam was performed according to the departmental dose-optimization program which includes automated exposure control, adjustment of the mA and/or kV according to patient size and/or use of iterative reconstruction technique. COMPARISON:  01/18/2023 FINDINGS: Lower chest: Interlobular septal thickening and ground-glass opacities in the lower lungs compatible with pulmonary edema. Cardiomegaly. Hepatobiliary: Unremarkable noncontrast appearance of the liver, gallbladder, and biliary tree. Pancreas: Unremarkable. Spleen: Unremarkable. Adrenals/Urinary Tract: Normal adrenal glands. Horseshoe kidney. Nonobstructing punctate calyceal stones in the left moiety. No hydronephrosis. Nondistended thick-walled bladder. Stomach/Bowel:  No bowel obstruction or bowel wall thickening. Stomach and appendix are within normal limits. Vascular/Lymphatic: Aortic atherosclerosis. No enlarged abdominal or pelvic lymph nodes. Reproductive: Unremarkable. Other: Vascular congestion in the mesentery. No free intraperitoneal fluid or air. Musculoskeletal: No acute fracture. IMPRESSION: 1. Bladder wall thickening may be due to underdistention or cystitis. 2. 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 3. Cardiomegaly with pulmonary edema in the lower lungs. Aortic Atherosclerosis (ICD10-I70.0). Electronically Signed   By: Minerva Fester M.D.   On: 08/01/2023 20:26   DG Chest 1  View Result Date: 08/01/2023 CLINICAL DATA:  Chest pain EXAM: CHEST  1 VIEW COMPARISON:  05/22/2023 FINDINGS: Cardiomegaly with mild central congestion. No focal airspace disease, pleural effusion or pneumothorax. IMPRESSION: Cardiomegaly with mild central congestion. Electronically Signed   By: Jasmine Pang M.D.   On: 08/01/2023 18:07        Scheduled Meds:  Chlorhexidine Gluconate Cloth  6 each Topical Daily   cloNIDine  0.1 mg Transdermal Weekly   darbepoetin (ARANESP) injection - DIALYSIS  100 mcg Subcutaneous Once   insulin aspart  0-6 Units Subcutaneous Q4H   pantoprazole (PROTONIX) IV  40 mg Intravenous Q12H   Continuous Infusions:  niCARDipine 3 mg/hr (08/03/23 0659)   promethazine (PHENERGAN) injection (IM or IVPB) 12.5 mg (08/03/23 0910)     LOS: 1 day    Time spent: 55 minutes    Cainan Trull D Sherryll Burger, DO [facility]  If 7PM-7AM, please contact night-coverage [email or web address] 08/03/2023, 11:01 AM

## 2023-08-04 ENCOUNTER — Inpatient Hospital Stay (HOSPITAL_COMMUNITY): Payer: 59 | Admitting: Anesthesiology

## 2023-08-04 ENCOUNTER — Encounter (HOSPITAL_COMMUNITY)
Admission: EM | Disposition: A | Discharge status: Home / Self Care | Payer: Self-pay | Source: Home / Self Care | Attending: Internal Medicine

## 2023-08-04 ENCOUNTER — Inpatient Hospital Stay (HOSPITAL_COMMUNITY): Payer: 59

## 2023-08-04 ENCOUNTER — Encounter (HOSPITAL_COMMUNITY): Payer: Self-pay | Admitting: Internal Medicine

## 2023-08-04 DIAGNOSIS — K226 Gastro-esophageal laceration-hemorrhage syndrome: Secondary | ICD-10-CM | POA: Diagnosis not present

## 2023-08-04 DIAGNOSIS — I5033 Acute on chronic diastolic (congestive) heart failure: Secondary | ICD-10-CM

## 2023-08-04 DIAGNOSIS — K209 Esophagitis, unspecified without bleeding: Secondary | ICD-10-CM

## 2023-08-04 DIAGNOSIS — K2970 Gastritis, unspecified, without bleeding: Secondary | ICD-10-CM

## 2023-08-04 DIAGNOSIS — I132 Hypertensive heart and chronic kidney disease with heart failure and with stage 5 chronic kidney disease, or end stage renal disease: Secondary | ICD-10-CM

## 2023-08-04 DIAGNOSIS — K317 Polyp of stomach and duodenum: Secondary | ICD-10-CM | POA: Diagnosis not present

## 2023-08-04 DIAGNOSIS — R1110 Vomiting, unspecified: Secondary | ICD-10-CM | POA: Diagnosis not present

## 2023-08-04 HISTORY — PX: ESOPHAGOGASTRODUODENOSCOPY (EGD) WITH PROPOFOL: SHX5813

## 2023-08-04 HISTORY — PX: BIOPSY: SHX5522

## 2023-08-04 LAB — COMPREHENSIVE METABOLIC PANEL
ALT: 17 U/L (ref 0–44)
AST: 11 U/L — ABNORMAL LOW (ref 15–41)
Albumin: 3.7 g/dL (ref 3.5–5.0)
Alkaline Phosphatase: 53 U/L (ref 38–126)
Anion gap: 15 (ref 5–15)
BUN: 57 mg/dL — ABNORMAL HIGH (ref 6–20)
CO2: 26 mmol/L (ref 22–32)
Calcium: 7.6 mg/dL — ABNORMAL LOW (ref 8.9–10.3)
Chloride: 95 mmol/L — ABNORMAL LOW (ref 98–111)
Creatinine, Ser: 13.34 mg/dL — ABNORMAL HIGH (ref 0.61–1.24)
GFR, Estimated: 4 mL/min — ABNORMAL LOW (ref >60)
Glucose, Bld: 126 mg/dL — ABNORMAL HIGH (ref 70–99)
Potassium: 4.4 mmol/L (ref 3.5–5.1)
Sodium: 136 mmol/L (ref 135–145)
Total Bilirubin: 1.4 mg/dL — ABNORMAL HIGH (ref 0.0–1.2)
Total Protein: 7.9 g/dL (ref 6.5–8.1)

## 2023-08-04 LAB — CBC
HCT: 30.8 % — ABNORMAL LOW (ref 39.0–52.0)
Hemoglobin: 9.8 g/dL — ABNORMAL LOW (ref 13.0–17.0)
MCH: 27.3 pg (ref 26.0–34.0)
MCHC: 31.8 g/dL (ref 30.0–36.0)
MCV: 85.8 fL (ref 80.0–100.0)
Platelets: 270 10*3/uL (ref 150–400)
RBC: 3.59 MIL/uL — ABNORMAL LOW (ref 4.22–5.81)
RDW: 18.6 % — ABNORMAL HIGH (ref 11.5–15.5)
WBC: 9.3 10*3/uL (ref 4.0–10.5)
nRBC: 0 % (ref 0.0–0.2)

## 2023-08-04 LAB — GLUCOSE, CAPILLARY
Glucose-Capillary: 101 mg/dL — ABNORMAL HIGH (ref 70–99)
Glucose-Capillary: 107 mg/dL — ABNORMAL HIGH (ref 70–99)
Glucose-Capillary: 111 mg/dL — ABNORMAL HIGH (ref 70–99)
Glucose-Capillary: 124 mg/dL — ABNORMAL HIGH (ref 70–99)
Glucose-Capillary: 125 mg/dL — ABNORMAL HIGH (ref 70–99)
Glucose-Capillary: 134 mg/dL — ABNORMAL HIGH (ref 70–99)
Glucose-Capillary: 144 mg/dL — ABNORMAL HIGH (ref 70–99)
Glucose-Capillary: 155 mg/dL — ABNORMAL HIGH (ref 70–99)
Glucose-Capillary: 158 mg/dL — ABNORMAL HIGH (ref 70–99)

## 2023-08-04 LAB — TROPONIN I (HIGH SENSITIVITY)
Troponin I (High Sensitivity): 59 ng/L — ABNORMAL HIGH (ref <18)
Troponin I (High Sensitivity): 62 ng/L — ABNORMAL HIGH (ref <18)

## 2023-08-04 LAB — HEPATITIS B SURFACE ANTIBODY, QUANTITATIVE: Hep B S AB Quant (Post): 7143 m[IU]/mL

## 2023-08-04 LAB — LACTIC ACID, PLASMA: Lactic Acid, Venous: 1.1 mmol/L (ref 0.5–1.9)

## 2023-08-04 LAB — MAGNESIUM: Magnesium: 2.2 mg/dL (ref 1.7–2.4)

## 2023-08-04 SURGERY — ESOPHAGOGASTRODUODENOSCOPY (EGD) WITH PROPOFOL
Anesthesia: General

## 2023-08-04 MED ORDER — HYDROMORPHONE HCL 1 MG/ML IJ SOLN
0.5000 mg | INTRAMUSCULAR | Status: DC | PRN
  Administered 2023-08-04: 0.5 mg via INTRAVENOUS
  Filled 2023-08-04: qty 0.5

## 2023-08-04 MED ORDER — GLYCOPYRROLATE PF 0.2 MG/ML IJ SOSY
PREFILLED_SYRINGE | INTRAMUSCULAR | Status: AC
  Filled 2023-08-04: qty 1

## 2023-08-04 MED ORDER — IOHEXOL 350 MG/ML SOLN
100.0000 mL | Freq: Once | INTRAVENOUS | Status: AC | PRN
  Administered 2023-08-04: 100 mL via INTRAVENOUS

## 2023-08-04 MED ORDER — HYDROMORPHONE HCL 1 MG/ML IJ SOLN
0.5000 mg | INTRAMUSCULAR | Status: DC | PRN
  Administered 2023-08-04 – 2023-08-05 (×4): 0.5 mg via INTRAVENOUS
  Filled 2023-08-04 (×4): qty 0.5

## 2023-08-04 MED ORDER — PROPOFOL 500 MG/50ML IV EMUL
INTRAVENOUS | Status: DC | PRN
  Administered 2023-08-04: 150 ug/kg/min via INTRAVENOUS

## 2023-08-04 MED ORDER — LABETALOL HCL 5 MG/ML IV SOLN
10.0000 mg | INTRAVENOUS | Status: DC | PRN
  Administered 2023-08-04: 10 mg via INTRAVENOUS
  Filled 2023-08-04: qty 4

## 2023-08-04 MED ORDER — SODIUM CHLORIDE 0.9 % IV SOLN: INTRAVENOUS | Status: DC | PRN

## 2023-08-04 MED ORDER — LIDOCAINE HCL (CARDIAC) PF 100 MG/5ML IV SOSY
PREFILLED_SYRINGE | INTRAVENOUS | Status: DC | PRN
  Administered 2023-08-04: 60 mg via INTRAVENOUS
  Administered 2023-08-04: 40 mg via INTRAVENOUS

## 2023-08-04 MED ORDER — PROPOFOL 10 MG/ML IV BOLUS
INTRAVENOUS | Status: DC | PRN
  Administered 2023-08-04: 50 mg via INTRAVENOUS
  Administered 2023-08-04: 70 mg via INTRAVENOUS

## 2023-08-04 MED ORDER — GLYCOPYRROLATE PF 0.2 MG/ML IJ SOSY
PREFILLED_SYRINGE | INTRAMUSCULAR | Status: DC | PRN
  Administered 2023-08-04: .1 mg via INTRAVENOUS

## 2023-08-04 NOTE — Progress Notes (Addendum)
[facility] Progress Note    Assessment/ Plan:    Intractable vomiting - improved today.  GI following.  CT scan of abd/pelvis without acute abnormality. Possible gastroparesis?  ESRD -  s/p HD 1/22 off schedule. Plan to get him back on TTS schedule, HD today  Hypertensive urgency/volume  - BP improving, off nicardipine gtt, will cont to UF as tolerated and challenge EDW if needed Acute on chronic diastolic CHF- UF as tolerated  Anemia  -  s/p aranesp 1/22  Metabolic bone disease -   meds on hold due to N/V, check phos DM2- per primary  Nutrition - per primary. Hematemesis - GI following.  Started on PPI. EGD pending  Dialysis Orders:  TTS - [facility] 4hr, 450/500, EDW 89.5kg, 1K/2.5Ca bath, LUE AVF, heparin 500 unit bolus + 1000/hr heparin infusion - Mircera IV q 2 weeks (did not get his dose 08/02/23) - Venofer 50mg  IV weekly  Subjective:   Patient seen and examined bedside. S/p HD yesterday, net uf 3.5L. He reports that his breathing is better today.   Objective:   BP 126/70   Pulse (!) 109   Temp 97.8 F (36.6 C) (Oral)   Resp 20   Ht 6' (1.829 m)   Wt 90.7 kg   SpO2 98%   BMI 27.12 kg/m   Intake/Output Summary (Last 24 hours) at 08/04/2023 0756 Last data filed at 08/03/2023 1832 Gross per 24 hour  Intake 406.35 ml  Output 3500 ml  Net -3093.65 ml   Weight change: -0.01 kg  Physical Exam: Gen: NAD, laying flat in bed CVS: RRR Resp: normal wob, cta bl, no overt w/r/r/c appreciated Abd: ND, soft Ext: no sig edema b/l Les Neuro: blind, sleepy but arousable and conversant Dialysis access: LUE AVF +b/t (dressings in place)  Imaging: CT ABDOMEN PELVIS WO CONTRAST Result Date: 08/02/2023 CLINICAL DATA:  Abdominal pain, acute, nonlocalized EXAM: CT ABDOMEN AND PELVIS WITHOUT CONTRAST TECHNIQUE: Multidetector CT imaging of the abdomen and pelvis was performed following the standard protocol without IV contrast. RADIATION DOSE REDUCTION:  This exam was performed according to the departmental dose-optimization program which includes automated exposure control, adjustment of the mA and/or kV according to patient size and/or use of iterative reconstruction technique. COMPARISON:  CT abdomen/pelvis dated August 01, 2023. FINDINGS: Lower chest: Interlobular septal thickening and increased ground-glass opacities in the bilateral lower lungs, most compatible with pulmonary edema. Cardiomegaly. Hepatobiliary: No suspicious focal hepatic lesion identified, within the limits of an unenhanced exam. Gallbladder is unremarkable. No biliary dilatation. Pancreas: Unremarkable. No pancreatic ductal dilatation or surrounding inflammatory changes. Spleen: Normal in size without focal abnormality. Adrenals/Urinary Tract: Adrenal glands are unremarkable. 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 No hydronephrosis. Bladder is unremarkable given the degree of distention. Stomach/Bowel: Stomach is collapsed limiting detailed evaluation. Appendix appears normal. No evidence of bowel wall thickening, distention, or inflammatory changes. Vascular/Lymphatic: The abdominal aorta is normal in caliber. Branch vascular calcifications. No enlarged abdominal or pelvic lymph nodes. Reproductive: Prostate is unremarkable. Other: No abdominopelvic ascites or intraperitoneal free air. No abdominal wall hernia. Musculoskeletal: No acute or significant osseous findings. IMPRESSION: 1. No acute localizing findings in the abdomen or pelvis. 2. 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 No hydronephrosis. 3. Cardiomegaly with pulmonary edema in the lower lungs, slightly increased compared to the prior exam. Electronically Signed   By: Hart Robinsons M.D.   On: 08/02/2023 15:03  DG Abdomen Acute W/Chest Result Date: 08/02/2023 CLINICAL DATA:  Abdominal pain EXAM: DG ABDOMEN ACUTE  WITH 1 VIEW CHEST COMPARISON:  Chest x-ray 08/01/2023.  CT 08/01/2023. FINDINGS: Enlarged cardiopericardial silhouette with prominence of the vasculature. Question component of interstitial edema. No pneumothorax or effusion. No consolidation. Overlapping cardiac leads. Overall only minimal bowel gas identified. There is some air in the nondilated stomach. No obvious dilated air-filled loops of bowel. Prominent vascular calcifications. Overlapping cardiac leads. No obvious free air beneath the diaphragm on the upright view. Abdominal images are under penetrated IMPRESSION: Enlarged heart with vascular congestion and question trace edema Minimal bowel gas, nonspecific. No obvious free air dilatation of bowel. Under penetrated abdominal radiographs Electronically Signed   By: Karen Kays M.D.   On: 08/02/2023 12:53    Labs: BMET Recent Labs  Lab 08/01/23 1815 08/02/23 1120 08/03/23 0506 08/04/23 0517  NA 137 139 142 136  K 4.8 5.1 4.7 4.4  CL 96* 94* 99 95*  CO2 21* 18* 23 26  GLUCOSE 134* 114* 147* 126*  BUN 75* 89* 101* 57*  CREATININE 16.93* 17.99* 20.35* 13.34*  CALCIUM 7.5* 7.9* 7.2* 7.6*   CBC Recent Labs  Lab 08/01/23 1815 08/02/23 1120 08/02/23 2118 08/03/23 0506 08/03/23 0845 08/04/23 0517  WBC 9.4 11.6*  --  11.4*  --  9.3  NEUTROABS 8.5* 10.6*  --   --   --   --   HGB 9.5* 9.3* 9.2* 8.4* 8.7* 9.8*  HCT 30.7* 28.8* 28.6* 26.0* 26.8* 30.8*  MCV 85.8 85.0  --  84.1  --  85.8  PLT 251 262  --  213  --  270    Medications:     Chlorhexidine Gluconate Cloth  6 each Topical Daily   cloNIDine  0.1 mg Transdermal Weekly   insulin aspart  0-6 Units Subcutaneous Q4H   pantoprazole (PROTONIX) IV  40 mg Intravenous Q12H      Anthony Sar, MD [facility] 08/04/2023, 7:56 AM

## 2023-08-04 NOTE — Plan of Care (Signed)

## 2023-08-04 NOTE — Progress Notes (Signed)
Patient underwent EGD under propofol sedation.  Tolerated the procedure adequately.   FINDINGS:  - LA Grade D esophagitis with no bleeding.   - Non bleeding Mallory- Weiss tear.   - Acute gastritis. Biopsied. Likely prolapsed gastropathy from rethcing and vomiting   - A single duodenal polyp. Resection not attempted given clinical indication of the procedure ( GI Bleed)   - No active or old bleeding seen throughout the exam  RECOMMENDATIONS  -Follow up path   -Restart diet as tolerated PPI BID for 2 weeks followed by daily to be continued   -Repeat EGD in 8- 12 after PPI to evaluate for healing of the severe esophagitis / assess underlying lesion/ Barretts and to remove duodenal bub polyp   -Follow up in Gi clinic upon discharge  Vista Lawman, MD Gastroenterology and Hepatology [facility]

## 2023-08-04 NOTE — Procedures (Addendum)
HD Note:  Some information was entered later than the data was gathered due to patient care needs. The stated time with the data is accurate.  Received patient in bed to unit.   Alert and oriented.   Informed consent signed and in chart.   Access used: Left forearm fistula Access issues: None  Patient began to complain of nausea after eating lunch.   Patient had stomach cramping during treatment and 200 ml NS bolus was given.   Patient began vomiting shortly after cramping started. See flowsheet for details.  Patient symptoms of cramping pain did not resolve after 30 min from the 200 ml bolus.  He stated that the pain was intense, 10/10 in central area of abdomen.  Emesis was light yellow in color. Patient decided to end treatment.  Dr. Romelle Starcher notified.  TX duration: 1 hour 37 min  Alert, without acute distress.  Total UF removed: 400 ml  Hand-off given to patient's nurse.   Transported back to the room   Brett Soza L. Dareen Piano, RN [facility] D[facility].

## 2023-08-04 NOTE — Progress Notes (Signed)
Patient briefly evaluated today for candidacy for EGD to further evaluate nausea/vomiting/coffee-ground emesis.  Blood pressures are better controlled today.  Labs have improved.  Completed hemodialysis yesterday.  Regular rate rhythm.  Chest clear to auscultation bilaterally.  Patient agreeable to proceed with EGD.  I have discussed the risks, alternatives, benefits with regards to but not limited to the risk of reaction to medication, bleeding, infection, perforation and the patient is agreeable to proceed. Written consent to be obtained.  Leanna Battles. [facility] [phone] 1/23/20259:27 AM

## 2023-08-04 NOTE — Op Note (Signed)
[facility] Patient Name: [name] Procedure Date: 08/04/2023 11:00 AM MRN: [id number] Date of Birth: 12/17/1985 Attending MD: Sanjuan Dame , MD, [id number] CSN: [id number] Age: 38 Admit Type: Inpatient Procedure:                Upper GI endoscopy Indications:              Epigastric abdominal pain, Coffee-ground emesis Providers:                Sanjuan Dame, MD, Edrick Kins, RN, Pandora Leiter, Technician Referring MD:              Medicines:                Monitored Anesthesia Care Complications:            No immediate complications. Estimated Blood Loss:     Estimated blood loss: none. Procedure:                Pre-Anesthesia Assessment:                           - Prior to the procedure, a History and Physical                            was performed, and patient medications and                            allergies were reviewed. The patient's tolerance of                            previous anesthesia was also reviewed. The risks                            and benefits of the procedure and the sedation                            options and risks were discussed with the patient.                            All questions were answered, and informed consent                            was obtained. Prior Anticoagulants: The patient has                            taken no anticoagulant or antiplatelet agents. ASA                            Grade Assessment: III - A patient with severe                            systemic disease. After reviewing the risks and  benefits, the patient was deemed in satisfactory                            condition to undergo the procedure.                           After obtaining informed consent, the endoscope was                            passed under direct vision. Throughout the                            procedure, the patient's blood pressure, pulse, and                             oxygen saturations were monitored continuously. The                            [id number]-[id number] ([id number]) scope was introduced through the                            mouth, and advanced to the second part of duodenum.                            The upper GI endoscopy was accomplished without                            difficulty. The patient tolerated the procedure                            well. Scope In: 11:16:35 AM Scope Out: 11:22:59 AM Total Procedure Duration: 0 hours 6 minutes 24 seconds  Findings:      LA Grade D (one or more mucosal breaks involving at least 75% of       esophageal circumference) esophagitis with no bleeding was found in the       lower third of the esophagus.      A medium non-bleeding Mallory-Weiss tear with stigmata of recent       bleeding was found.      Patchy severe inflammation characterized by erythema was found in the       gastric fundus. Biopsies were taken with a cold forceps for histology.      A single 5 mm sessile polyp with no bleeding was found in the duodenal       bulb. Polypectomy was not attempted.      There is no endoscopic evidence of bleeding in the entire examined       stomach. Impression:               - LA Grade D esophagitis with no bleeding.                           - Non bleeding Mallory-Weiss tear.                           - Acute gastritis. Biopsied. Likely prolapsed  gastropathy from rethcing and vomiting                           - A single duodenal polyp. Resection not attempted                            given clinical indication of the procedure ( GI                            Bleed)                           -No active or old bleeding seen throughout the exam Moderate Sedation:      Per Anesthesia Care Recommendation:           Follow up path                           Restart diet as tolerated                           PPI BID for 2 weeks followed by daily to be                             continued                           Repeat EGD in 8-12 after PPI to evaluate for                            healing of the severe esophagitis Earnest Bailey                            underlying lesion/Barretts and to remove duodenal                            bub polyp                           Follow up in Gi clinic upon discharge Procedure Code(s):        --- Professional ---                           [phone], Esophagogastroduodenoscopy, flexible,                            transoral; with biopsy, single or multiple Diagnosis Code(s):        --- Professional ---                           K20.90, Esophagitis, unspecified without bleeding                           K22.6, Gastro-esophageal laceration-hemorrhage                            syndrome  K29.00, Acute gastritis without bleeding                           K31.7, Polyp of stomach and duodenum                           R10.13, Epigastric pain                           K92.0, Hematemesis CPT copyright 2022 [employer]. All rights reserved. The codes documented in this report are preliminary and upon coder review may  be revised to meet current compliance requirements. Sanjuan Dame, MD Sanjuan Dame, MD 08/04/2023 11:33:27 AM This report has been signed electronically. Number of Addenda: 0

## 2023-08-04 NOTE — Plan of Care (Signed)
  Problem: Education: Goal: Knowledge of General Education information will improve Description: Including pain rating scale, medication(s)/side effects and non-pharmacologic comfort measures Outcome: Progressing   Problem: Health Behavior/Discharge Planning: Goal: Ability to manage health-related needs will improve Outcome: Progressing   Problem: Clinical Measurements: Goal: Ability to maintain clinical measurements within normal limits will improve Outcome: Progressing Goal: Will remain free from infection Outcome: Progressing Goal: Diagnostic test results will improve Outcome: Progressing Goal: Respiratory complications will improve Outcome: Progressing Goal: Cardiovascular complication will be avoided Outcome: Not Progressing   Problem: Activity: Goal: Risk for activity intolerance will decrease Outcome: Not Progressing   Problem: Nutrition: Goal: Adequate nutrition will be maintained Outcome: Not Progressing   Problem: Coping: Goal: Level of anxiety will decrease Outcome: Progressing   Problem: Elimination: Goal: Will not experience complications related to bowel motility Outcome: Progressing Goal: Will not experience complications related to urinary retention Outcome: Progressing   Problem: Pain Managment: Goal: General experience of comfort will improve and/or be controlled Outcome: Not Progressing   Problem: Safety: Goal: Ability to remain free from injury will improve Outcome: Progressing   Problem: Skin Integrity: Goal: Risk for impaired skin integrity will decrease Outcome: Progressing   Problem: Education: Goal: Ability to describe self-care measures that may prevent or decrease complications (Diabetes Survival Skills Education) will improve Outcome: Progressing Goal: Individualized Educational Video(s) Outcome: Progressing   Problem: Coping: Goal: Ability to adjust to condition or change in health will improve Outcome: Progressing   Problem:  Fluid Volume: Goal: Ability to maintain a balanced intake and output will improve Outcome: Progressing   Problem: Health Behavior/Discharge Planning: Goal: Ability to identify and utilize available resources and services will improve Outcome: Progressing Goal: Ability to manage health-related needs will improve Outcome: Progressing   Problem: Metabolic: Goal: Ability to maintain appropriate glucose levels will improve Outcome: Progressing   Problem: Nutritional: Goal: Maintenance of adequate nutrition will improve Outcome: Not Progressing Goal: Progress toward achieving an optimal weight will improve Outcome: Not Progressing   Problem: Skin Integrity: Goal: Risk for impaired skin integrity will decrease Outcome: Progressing

## 2023-08-04 NOTE — Anesthesia Preprocedure Evaluation (Signed)
Anesthesia Evaluation  Patient identified by MRN, date of birth, ID band Patient awake    Reviewed: Allergy & Precautions, H&P , NPO status , Patient's Chart, lab work & pertinent test results, reviewed documented beta blocker date and time   Airway Mallampati: II  TM Distance: >3 FB Neck ROM: full    Dental no notable dental hx.    Pulmonary neg pulmonary ROS   Pulmonary exam normal breath sounds clear to auscultation       Cardiovascular Exercise Tolerance: Good hypertension, +CHF  negative cardio ROS  Rhythm:regular Rate:Normal     Neuro/Psych Seizures -,  PSYCHIATRIC DISORDERS      negative neurological ROS  negative psych ROS   GI/Hepatic negative GI ROS, Neg liver ROS,,,  Endo/Other  negative endocrine ROSdiabetes    Renal/GU Renal diseasenegative Renal ROS  negative genitourinary   Musculoskeletal   Abdominal   Peds  Hematology negative hematology ROS (+) Blood dyscrasia, anemia   Anesthesia Other Findings   Reproductive/Obstetrics negative OB ROS                             Anesthesia Physical Anesthesia Plan  ASA: 3 and emergent  Anesthesia Plan: General   Post-op Pain Management:    Induction:   PONV Risk Score and Plan: Propofol infusion  Airway Management Planned:   Additional Equipment:   Intra-op Plan:   Post-operative Plan:   Informed Consent: I have reviewed the patients History and Physical, chart, labs and discussed the procedure including the risks, benefits and alternatives for the proposed anesthesia with the patient or authorized representative who has indicated his/her understanding and acceptance.     Dental Advisory Given  Plan Discussed with: CRNA  Anesthesia Plan Comments:        Anesthesia Quick Evaluation

## 2023-08-04 NOTE — Progress Notes (Signed)
Patient was examined due to complaints of abdominal pain 2 hours after EGD.     Post procedure patient was seen and examined where he was comfortable , went through EGD findings and had benign abdomen .  Reports that the pain and nausea is similar to what he presented with to the hospital .  Vitals:   08/04/23 1500 08/04/23 1501  BP: (!) 186/113 (!) 186/113  Pulse: (!) 106 (!) 107  Resp: 10 15  Temp: 98.2 F (36.8 C)   SpO2: 99% 100%    Patient is comfortable but has epigastric tenderness on exam . Abdomen is soft   Xray was ordered and was independently reviewed . I do not see air under diaphragm  Recommend following up Abdominal xray official read  Consider obtaining CT angiogram Abdomen and pelvis to assess vasculature and given the severity of the symptoms  Obtain Lactate

## 2023-08-04 NOTE — Interval H&P Note (Signed)
History and Physical Interval Note:  08/04/2023 10:11 AM  [name]  has presented today for surgery, with the diagnosis of coffee ground emesis, nausea/vomiting, anemia.  The various methods of treatment have been discussed with the patient and family. After consideration of risks, benefits and other options for treatment, the patient has consented to  Procedure(s): ESOPHAGOGASTRODUODENOSCOPY (EGD) WITH PROPOFOL (N/A) as a surgical intervention.  The patient's history has been reviewed, patient examined, no change in status, stable for surgery.  I have reviewed the patient's chart and labs.  Questions were answered to the patient's satisfaction.    Patient is legally blind but very coherent , able to ask adequate questions and understands well   [name]

## 2023-08-04 NOTE — Progress Notes (Signed)
PROGRESS NOTE    [name]  ZOX:[id number] DOB: Sep 07, 1985 DOA: 08/02/2023 PCP: Rema Fendt, NP   Brief Narrative:    [name] is a 38 y.o. male with medical history significant for ESRD, diabetes mellitus, hypertrophic cardiomyopathy, congestive heart failure, blindness. Patient presented to the ED with complaints of multiple episodes of vomiting that started yesterday.  Patient was admitted for hematemesis in the setting of intractable nausea and vomiting and is also noted to have hypertensive urgency as well as acute on chronic diastolic CHF exacerbation in the setting of ESRD on HD.  Patient underwent EGD on 1/23 with findings of grade D esophagitis and Mallory-Weiss tear with no active bleeding.  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  Possibly secondary to gastroparesis, considering he is diabetic, blind, and with renal failure. -Appreciate GI with EGD 1/23 and findings of grade D esophagitis with Mallory-Weiss tear that is nonbleeding -Continue IV PPI twice daily -Start soft diet     Acute hypoxic respiratory failure (HCC) O2 sat dropped to 82% on room air status post 0.5 mg of Dilaudid.  Later ambulated sats 87%.  Chest imaging with pulmonary edema. -HD planned per nephrology   Hypertensive urgency-resolved Blood pressure persistently elevated up to 210/124, minimal response to IV labetalol 10 mg.  He is on clonidine 0.1 mg which she  reports compliance with.  Likely component of rebound hypertension, do not able to keep his medications down. -Patient now off of Cardene drip and on home medications -Okay for transfer to telemetry   Acute on chronic diastolic CHF (congestive heart failure) (HCC) Chest imaging showing pulmonary edema, last echo 10/2022, EF 55 to 60%. -Hemodialysis planned as above   ESRD (end stage renal disease) on dialysis [facility]) On dialysis Tuesday Thursday Saturday, reports he has not missed any HD session.  Was 10 minutes into his HD session today when he was sent to the ED.  Elevated BUN at 89.  Potassium 5.1.  Blood pressure elevated. 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 On long-acting insulin 6 units nightly. -Continue every 4 hours SSI and continue monitoring -Cannot give IV fluid   DVT prophylaxis:SCDs Code Status: Full Family Communication: None at bedside Disposition Plan:  Status is: Inpatient Remains inpatient appropriate because: Need for IV medications and inpatient evaluation.   Consultants:  Nephrology GI  Procedures:  None  Antimicrobials:  None  Subjective: Patient seen and evaluated today and states that his shortness of breath is improved.  Objective: Vitals:   08/04/23 0900 08/04/23 1011 08/04/23 1130 08/04/23 1145  BP: 128/79 (!) 136/98 107/74 112/84  Pulse:   97 96  Resp: 15 14 20 20   Temp:  98.7 F (37.1 C) 99 F (37.2 C) 98.8 F (37.1 C)  TempSrc:      SpO2:  97% 93% 100%  Weight:  Height:        Intake/Output Summary (Last 24 hours) at 08/04/2023 1224 Last data filed at 08/04/2023 1124 Gross per 24 hour  Intake 606.35 ml  Output 3500 ml  Net -2893.65 ml   Filed Weights   08/02/23 1121 08/03/23 1023  Weight: 90.7 kg 90.7 kg    Examination:  General exam: Appears to be in mild to moderate distress Respiratory  system: Clear to auscultation. Respiratory effort normal. Cardiovascular system: S1 & S2 heard, RRR.  Gastrointestinal system: Abdomen is soft Central nervous system: Alert and awake Extremities: No edema Skin: No significant lesions noted    Data Reviewed: I have personally reviewed following labs and imaging studies  CBC: Recent Labs  Lab 08/01/23 1815 08/02/23 1120 08/02/23 2118 08/03/23 0506 08/03/23 0845 08/04/23 0517  WBC 9.4 11.6*  --  11.4*  --  9.3  NEUTROABS 8.5* 10.6*  --   --   --   --   HGB 9.5* 9.3* 9.2* 8.4* 8.7* 9.8*  HCT 30.7* 28.8* 28.6* 26.0* 26.8* 30.8*  MCV 85.8 85.0  --  84.1  --  85.8  PLT 251 262  --  213  --  270   Basic Metabolic Panel: Recent Labs  Lab 08/01/23 1815 08/02/23 1120 08/02/23 2116 08/03/23 0506 08/04/23 0517  NA 137 139  --  142 136  K 4.8 5.1  --  4.7 4.4  CL 96* 94*  --  99 95*  CO2 21* 18*  --  23 26  GLUCOSE 134* 114*  --  147* 126*  BUN 75* 89*  --  101* 57*  CREATININE 16.93* 17.99*  --  20.35* 13.34*  CALCIUM 7.5* 7.9*  --  7.2* 7.6*  MG 2.4  --  2.5*  --  2.2   GFR: Estimated Creatinine Clearance: 8.2 mL/min (A) (by C-G formula based on SCr of 13.34 mg/dL (H)). Liver Function Tests: Recent Labs  Lab 08/01/23 1815 08/02/23 1120 08/04/23 0517  AST 12* 18 11*  ALT 10 19 17   ALKPHOS 48 56 53  BILITOT 1.1 1.3* 1.4*  PROT 7.7 8.7* 7.9  ALBUMIN 3.9 4.4 3.7   Recent Labs  Lab 08/01/23 1815  LIPASE 24   No results for input(s): "AMMONIA" in the last 168 hours. Coagulation Profile: No results for input(s): "INR", "PROTIME" in the last 168 hours. Cardiac Enzymes: No results for input(s): "CKTOTAL", "CKMB", "CKMBINDEX", "TROPONINI" in the last 168 hours. BNP (last 3 results) No results for input(s): "PROBNP" in the last 8760 hours. HbA1C: Recent Labs    08/01/23 2329  HGBA1C 6.0*   CBG: Recent Labs  Lab 08/04/23 0449 08/04/23 0748 08/04/23 1011 08/04/23 1140 08/04/23 1214  GLUCAP 124* 155* 134*  107* 101*   Lipid Profile: No results for input(s): "CHOL", "HDL", "LDLCALC", "TRIG", "CHOLHDL", "LDLDIRECT" in the last 72 hours. Thyroid Function Tests: No results for input(s): "TSH", "T4TOTAL", "FREET4", "T3FREE", "THYROIDAB" in the last 72 hours. Anemia Panel: No results for input(s): "VITAMINB12", "FOLATE", "FERRITIN", "TIBC", "IRON", "RETICCTPCT" in the last 72 hours. Sepsis Labs: Recent Labs  Lab 08/01/23 1824 08/01/23 2101  LATICACIDVEN 1.8 2.1*    Recent Results (from the past 240 hours)  MRSA Next Gen by PCR, Nasal     Status: None   Collection Time: 08/02/23  8:32 PM   Specimen: Nasal Mucosa; Nasal Swab  Result Value Ref Range Status   MRSA by PCR Next Gen NOT DETECTED NOT DETECTED Final    Comment: (NOTE) The GeneXpert  MRSA Assay (FDA approved for NASAL specimens only), is one component of a comprehensive MRSA colonization surveillance program. It is not intended to diagnose MRSA infection nor to guide or monitor treatment for MRSA infections. Test performance is not FDA approved in patients less than 84 years old. Performed at [facility], [address], [place], [place] [address]          Radiology Studies: CT ABDOMEN PELVIS WO CONTRAST Result Date: 08/02/2023 CLINICAL DATA:  Abdominal pain, acute, nonlocalized EXAM: CT ABDOMEN AND PELVIS WITHOUT CONTRAST TECHNIQUE: Multidetector CT imaging of the abdomen and pelvis was performed following the standard protocol without IV contrast. RADIATION DOSE REDUCTION: This exam was performed according to the departmental dose-optimization program which includes automated exposure control, adjustment of the mA and/or kV according to patient size and/or use of iterative reconstruction technique. COMPARISON:  CT abdomen/pelvis dated August 01, 2023. FINDINGS: Lower chest: Interlobular septal thickening and increased ground-glass opacities in the bilateral lower lungs, most compatible with pulmonary edema. Cardiomegaly.  Hepatobiliary: No suspicious focal hepatic lesion identified, within the limits of an unenhanced exam. Gallbladder is unremarkable. No biliary dilatation. Pancreas: Unremarkable. No pancreatic ductal dilatation or surrounding inflammatory changes. Spleen: Normal in size without focal abnormality. Adrenals/Urinary Tract: Adrenal glands are unremarkable. 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 No hydronephrosis. Bladder is unremarkable given the degree of distention. Stomach/Bowel: Stomach is collapsed limiting detailed evaluation. Appendix appears normal. No evidence of bowel wall thickening, distention, or inflammatory changes. Vascular/Lymphatic: The abdominal aorta is normal in caliber. Branch vascular calcifications. No enlarged abdominal or pelvic lymph nodes. Reproductive: Prostate is unremarkable. Other: No abdominopelvic ascites or intraperitoneal free air. No abdominal wall hernia. Musculoskeletal: No acute or significant osseous findings. IMPRESSION: 1. No acute localizing findings in the abdomen or pelvis. 2. 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 No hydronephrosis. 3. Cardiomegaly with pulmonary edema in the lower lungs, slightly increased compared to the prior exam. Electronically Signed   By: Hart Robinsons M.D.   On: 08/02/2023 15:03   DG Abdomen Acute W/Chest Result Date: 08/02/2023 CLINICAL DATA:  Abdominal pain EXAM: DG ABDOMEN ACUTE WITH 1 VIEW CHEST COMPARISON:  Chest x-ray 08/01/2023.  CT 08/01/2023. FINDINGS: Enlarged cardiopericardial silhouette with prominence of the vasculature. Question component of interstitial edema. No pneumothorax or effusion. No consolidation. Overlapping cardiac leads. Overall only minimal bowel gas identified. There is some air in the nondilated stomach. No obvious dilated air-filled loops of bowel. Prominent vascular calcifications. Overlapping cardiac  leads. No obvious free air beneath the diaphragm on the upright view. Abdominal images are under penetrated IMPRESSION: Enlarged heart with vascular congestion and question trace edema Minimal bowel gas, nonspecific. No obvious free air dilatation of bowel. Under penetrated abdominal radiographs Electronically Signed   By: Karen Kays M.D.   On: 08/02/2023 12:53        Scheduled Meds:  Chlorhexidine Gluconate Cloth  6 each Topical Daily   cloNIDine  0.1 mg Transdermal Weekly   insulin aspart  0-6 Units Subcutaneous Q4H   pantoprazole (PROTONIX) IV  40 mg Intravenous Q12H   Continuous Infusions:  niCARDipine Stopped (08/04/23 0315)   promethazine (PHENERGAN) injection (IM or IVPB) Stopped (08/03/23 1710)     LOS: 2 days    Time spent: 55 minutes    Flecia Shutter D Sherryll Burger, DO [facility]  If 7PM-7AM, please contact night-coverage [email or web address] 08/04/2023, 12:24 PM

## 2023-08-04 NOTE — Transfer of Care (Addendum)
Immediate Anesthesia Transfer of Care Note  Patient: [name]  Procedure(s) Performed: ESOPHAGOGASTRODUODENOSCOPY (EGD) WITH PROPOFOL BIOPSY  Patient Location: PACU  Anesthesia Type:General  Level of Consciousness: drowsy and patient cooperative  Airway & Oxygen Therapy: Patient Spontanous Breathing and Patient connected to face mask oxygen  Post-op Assessment: Report given to RN and Post -op Vital signs reviewed and stable  Post vital signs: Reviewed and stable  Last Vitals:  Vitals Value Taken Time  BP 111/76 08/04/23 1128  Temp 37.2 08/04/23   1130  Pulse 97 08/04/23 1129  Resp 20 08/04/23 1129  SpO2 93 % 08/04/23 1129  Vitals shown include unfiled device data.  Last Pain:  Vitals:   08/04/23 1109  TempSrc:   PainSc: 5       Patients Stated Pain Goal: 4 (08/04/23 1010)  Complications: No notable events documented.

## 2023-08-05 ENCOUNTER — Encounter (HOSPITAL_COMMUNITY): Payer: Self-pay | Admitting: Gastroenterology

## 2023-08-05 DIAGNOSIS — K226 Gastro-esophageal laceration-hemorrhage syndrome: Secondary | ICD-10-CM | POA: Diagnosis not present

## 2023-08-05 DIAGNOSIS — K317 Polyp of stomach and duodenum: Secondary | ICD-10-CM | POA: Diagnosis not present

## 2023-08-05 DIAGNOSIS — R1110 Vomiting, unspecified: Secondary | ICD-10-CM | POA: Diagnosis not present

## 2023-08-05 DIAGNOSIS — D649 Anemia, unspecified: Secondary | ICD-10-CM | POA: Diagnosis not present

## 2023-08-05 DIAGNOSIS — K209 Esophagitis, unspecified without bleeding: Secondary | ICD-10-CM | POA: Diagnosis not present

## 2023-08-05 LAB — GLUCOSE, CAPILLARY
Glucose-Capillary: 120 mg/dL — ABNORMAL HIGH (ref 70–99)
Glucose-Capillary: 122 mg/dL — ABNORMAL HIGH (ref 70–99)
Glucose-Capillary: 135 mg/dL — ABNORMAL HIGH (ref 70–99)
Glucose-Capillary: 152 mg/dL — ABNORMAL HIGH (ref 70–99)
Glucose-Capillary: 154 mg/dL — ABNORMAL HIGH (ref 70–99)
Glucose-Capillary: 182 mg/dL — ABNORMAL HIGH (ref 70–99)

## 2023-08-05 LAB — CBC
HCT: 31.4 % — ABNORMAL LOW (ref 39.0–52.0)
Hemoglobin: 9.7 g/dL — ABNORMAL LOW (ref 13.0–17.0)
MCH: 26.4 pg (ref 26.0–34.0)
MCHC: 30.9 g/dL (ref 30.0–36.0)
MCV: 85.6 fL (ref 80.0–100.0)
Platelets: 282 10*3/uL (ref 150–400)
RBC: 3.67 MIL/uL — ABNORMAL LOW (ref 4.22–5.81)
RDW: 18.5 % — ABNORMAL HIGH (ref 11.5–15.5)
WBC: 11.2 10*3/uL — ABNORMAL HIGH (ref 4.0–10.5)
nRBC: 0 % (ref 0.0–0.2)

## 2023-08-05 LAB — BASIC METABOLIC PANEL
Anion gap: 16 — ABNORMAL HIGH (ref 5–15)
BUN: 55 mg/dL — ABNORMAL HIGH (ref 6–20)
CO2: 25 mmol/L (ref 22–32)
Calcium: 7.5 mg/dL — ABNORMAL LOW (ref 8.9–10.3)
Chloride: 91 mmol/L — ABNORMAL LOW (ref 98–111)
Creatinine, Ser: 12.56 mg/dL — ABNORMAL HIGH (ref 0.61–1.24)
GFR, Estimated: 5 mL/min — ABNORMAL LOW (ref >60)
Glucose, Bld: 120 mg/dL — ABNORMAL HIGH (ref 70–99)
Potassium: 4.3 mmol/L (ref 3.5–5.1)
Sodium: 132 mmol/L — ABNORMAL LOW (ref 135–145)

## 2023-08-05 LAB — MAGNESIUM: Magnesium: 2.1 mg/dL (ref 1.7–2.4)

## 2023-08-05 LAB — SURGICAL PATHOLOGY

## 2023-08-05 MED ORDER — METOCLOPRAMIDE HCL 10 MG PO TABS
10.0000 mg | ORAL_TABLET | Freq: Four times a day (QID) | ORAL | Status: DC
Start: 2023-08-05 — End: 2023-08-06
  Administered 2023-08-05 – 2023-08-06 (×6): 10 mg via ORAL
  Filled 2023-08-05 (×6): qty 1

## 2023-08-05 MED ORDER — LABETALOL HCL 5 MG/ML IV SOLN
10.0000 mg | Freq: Once | INTRAVENOUS | Status: AC
  Administered 2023-08-05: 10 mg via INTRAVENOUS

## 2023-08-05 MED ORDER — CARVEDILOL 12.5 MG PO TABS
25.0000 mg | ORAL_TABLET | Freq: Two times a day (BID) | ORAL | Status: DC
  Administered 2023-08-05 – 2023-08-06 (×4): 25 mg via ORAL
  Filled 2023-08-05 (×4): qty 2

## 2023-08-05 NOTE — Plan of Care (Signed)

## 2023-08-05 NOTE — Progress Notes (Signed)
[facility] Progress Note    Assessment/ Plan:    Intractable vomiting, hematemesis, abd pain - GI following.  Started on PPI. EGD 1/23 with esophagitis, non bleeding mallory weiss tear, acute gastritis, no active bleeding. S/p CT angio a/p on 1/23 without any acute findings. Possible gastroparesis?  ESRD -  s/p HD 1/22 off schedule. Back on TTS schedule, HD tomorrow  Hypertensive urgency/volume  - BP stable, off nicardipine gtt, will cont to UF as tolerated and challenge EDW if needed Acute on chronic diastolic CHF- UF as tolerated  Anemia  -  s/p aranesp 1/22, transfuse PRN  Metabolic bone disease -   meds on hold due to N/V, check phos DM2- per primary  Nutrition - per primary.  Dialysis Orders:  TTS - [facility] 4hr, 450/500, EDW 89.5kg, 1K/2.5Ca bath, LUE AVF, heparin 500 unit bolus + 1000/hr heparin infusion - Mircera IV q 2 weeks (did not get his dose 08/02/23) - Venofer 50mg  IV weekly  Subjective:   Patient seen and examined bedside. S/p HD yesterday, did not tolerate due to abd cramping/pain. Had vomiting. Treatment terminated around 1 hr 37 min. Net UF 0.4L Today, patient reports that he has some residual epigastric discomfort from vomiting but feels better today. He reports that he had pain yesterday due to trying to eat a lot all at once prior to HD. He does suprisingly remember me from back in 2021. Denies any CP, SOB, swelling.   Objective:   BP (!) 165/99   Pulse 89   Temp 98.7 F (37.1 C) (Oral)   Resp 15   Ht 6' (1.829 m)   Wt 84.3 kg   SpO2 99%   BMI 25.21 kg/m   Intake/Output Summary (Last 24 hours) at 08/05/2023 0851 Last data filed at 08/05/2023 0422 Gross per 24 hour  Intake 610.96 ml  Output 400 ml  Net 210.96 ml   Weight change: -6.4 kg  Physical Exam:  Gen: NAD, sitting up in bed CVS: RRR Resp: normal wob, cta bl, no overt w/r/r/c appreciated, sat'ing 99% on 2L Abd: ND, soft, NT on light palpation Ext: no sig edema  b/l Les Neuro: blind, awake/alert Dialysis access: LUE AVF +b/t (dressings in place)  Imaging: CT Angio Abd/Pel w/ and/or w/o Result Date: 08/04/2023 CLINICAL DATA:  intractable abdominal pain , EGD not explaining cause of pain EXAM: CTA ABDOMEN AND PELVIS WITHOUT AND WITH CONTRAST TECHNIQUE: Multidetector CT imaging of the abdomen and pelvis was performed using the standard protocol during bolus administration of intravenous contrast. Multiplanar reconstructed images and MIPs were obtained and reviewed to evaluate the vascular anatomy. RADIATION DOSE REDUCTION: This exam was performed according to the departmental dose-optimization program which includes automated exposure control, adjustment of the mA and/or kV according to patient size and/or use of iterative reconstruction technique. CONTRAST:  OMNIPAQUE IOHEXOL 350 MG/ML SOLN COMPARISON:  08/02/2023 FINDINGS: VASCULAR Aorta: Normal caliber aorta without aneurysm, dissection, vasculitis or significant stenosis. Celiac: Patent without evidence of aneurysm, dissection, vasculitis or significant stenosis. Calcifications in the proximal to mid SMA without stenosis. SMA: Patent without evidence of aneurysm, dissection, vasculitis or significant stenosis. 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 Renal arteries are widely patent. IMA: Patent without evidence of aneurysm, dissection, vasculitis or significant stenosis. Inflow: Patent without evidence of aneurysm, dissection, vasculitis or significant stenosis. Proximal Outflow: Bilateral common femoral and visualized portions of the superficial and profunda femoral arteries are patent without evidence of aneurysm, dissection, vasculitis or  significant stenosis. Veins: No obvious venous abnormality within the limitations of this arterial phase study. Review of the MIP images confirms the above findings. NON-VASCULAR Lower chest: Cardiomegaly. Bilateral lower lobe  ground-glass opacities dependently, improved since prior study. This could reflect residual edema or atelectasis. No effusions. Hepatobiliary: No focal hepatic abnormality. Gallbladder unremarkable. Pancreas: No focal abnormality or ductal dilatation. Spleen: No focal abnormality.  Normal size. Adrenals/Urinary Tract: Horseshoe kidney. Previously seen punctate left renal stones not visible on today's study. No hydronephrosis. No renal or adrenal mass. Urinary bladder unremarkable. Stomach/Bowel: Normal appendix. Stomach, large and small bowel grossly unremarkable. Lymphatic: No adenopathy Reproductive: No visible focal abnormality. Other: No free fluid or free air. Musculoskeletal: No acute bony abnormality. IMPRESSION: VASCULAR No significant aortic or arterial stenosis. Calcifications within the proximal to mid SMA without stenosis. No evidence of aneurysm or dissection. NON-VASCULAR Cardiomegaly. Ground-glass opacities dependently in the lower lobes could reflect residual edema or atelectasis. Horseshoe kidney.  No hydronephrosis. No acute findings. Electronically Signed   By: Charlett Nose M.D.   On: 08/04/2023 18:06   DG Abd Portable 1V Result Date: 08/04/2023 CLINICAL DATA:  Abdominal pain and nausea. EXAM: PORTABLE ABDOMEN - 1 VIEW COMPARISON:  08/02/2023.  Abdomen and pelvis CT dated 08/02/2023 FINDINGS: Stable poor inspiration and enlarged cardiac silhouette. Stable mildly prominent pulmonary vasculature and minimal diffuse peribronchial thickening. No airspace consolidation or pleural fluid Normal bowel-gas pattern. Calcific density projected over the left mid abdomen laterally, not present on the previous examinations. This could represent something ingested or an artifact unremarkable bones. IMPRESSION: 1. No acute abnormality. 2. Stable cardiomegaly and mild pulmonary vascular congestion. 3. Stable minimal bronchitic changes. Electronically Signed   By: Beckie Salts M.D.   On: 08/04/2023 15:44     Labs: BMET Recent Labs  Lab 08/01/23 1815 08/02/23 1120 08/03/23 0506 08/04/23 0517 08/05/23 0506  NA 137 139 142 136 132*  K 4.8 5.1 4.7 4.4 4.3  CL 96* 94* 99 95* 91*  CO2 21* 18* 23 26 25   GLUCOSE 134* 114* 147* 126* 120*  BUN 75* 89* 101* 57* 55*  CREATININE 16.93* 17.99* 20.35* 13.34* 12.56*  CALCIUM 7.5* 7.9* 7.2* 7.6* 7.5*   CBC Recent Labs  Lab 08/01/23 1815 08/02/23 1120 08/02/23 2118 08/03/23 0506 08/03/23 0845 08/04/23 0517 08/05/23 0506  WBC 9.4 11.6*  --  11.4*  --  9.3 11.2*  NEUTROABS 8.5* 10.6*  --   --   --   --   --   HGB 9.5* 9.3*   < > 8.4* 8.7* 9.8* 9.7*  HCT 30.7* 28.8*   < > 26.0* 26.8* 30.8* 31.4*  MCV 85.8 85.0  --  84.1  --  85.8 85.6  PLT 251 262  --  213  --  270 282   < > = values in this interval not displayed.    Medications:     Chlorhexidine Gluconate Cloth  6 each Topical Daily   cloNIDine  0.1 mg Transdermal Weekly   insulin aspart  0-6 Units Subcutaneous Q4H   pantoprazole (PROTONIX) IV  40 mg Intravenous Q12H      Anthony Sar, MD [facility] 08/05/2023, 8:51 AM

## 2023-08-05 NOTE — Progress Notes (Signed)
PROGRESS NOTE    [name]  GUY:[id number] DOB: February 05, 1986 DOA: 08/02/2023 PCP: Rema Fendt, NP   Brief Narrative:    [name] is a 38 y.o. male with medical history significant for ESRD, diabetes mellitus, hypertrophic cardiomyopathy, congestive heart failure, blindness. Patient presented to the ED with complaints of multiple episodes of vomiting that started yesterday.  Patient was admitted for hematemesis in the setting of intractable nausea and vomiting and is also noted to have hypertensive urgency as well as acute on chronic diastolic CHF exacerbation in the setting of ESRD on HD.  Patient underwent EGD on 1/23 with findings of grade D esophagitis and Mallory-Weiss tear with no active bleeding.  He continued to have significant pain after the procedure and underwent abdominal CT angiogram with no significant findings noted.  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  Possibly secondary to gastroparesis, considering he is diabetic, blind, and with renal failure. -Appreciate GI with EGD 1/23 and findings of grade D esophagitis with Mallory-Weiss tear that is nonbleeding -Continue IV PPI twice daily -Start soft diet -CT angiography 1/23 with no significant findings -Question gastroparesis and patient has been resumed on home Reglan     Acute hypoxic respiratory failure (HCC) O2 sat dropped to 82% on room air status post 0.5 mg of Dilaudid.  Later  ambulated sats 87%.  Chest imaging with pulmonary edema. -HD planned per nephrology   Hypertensive urgency-improved Blood pressure persistently elevated up to 210/124, minimal response to IV labetalol 10 mg.  He is on clonidine 0.1 mg which she reports compliance with.  Likely component of rebound hypertension, do not able to keep his medications down. -Patient now off of Cardene drip and on home medications -Resume home carvedilol and monitor on telemetry -Continue clonidine patch for now   Acute on chronic diastolic CHF (congestive heart failure) (HCC) Chest imaging showing pulmonary edema, last echo 10/2022, EF 55 to 60%. -Hemodialysis planned as above   ESRD (end stage renal disease) on dialysis [facility]) On dialysis Tuesday Thursday Saturday, reports he has not missed any HD session.  Was 10 minutes into his HD session today when he was sent to the ED.  Elevated BUN at 89.  Potassium 5.1.  Blood pressure elevated. 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 On long-acting insulin 6 units nightly. -Continue every 4 hours SSI and continue monitoring -Cannot give IV fluid   DVT prophylaxis:SCDs Code Status: Full Family Communication: None at bedside Disposition Plan:  Status is: Inpatient Remains inpatient appropriate because: Need for IV medications and inpatient evaluation.   Consultants:  Nephrology GI  Procedures:  None  Antimicrobials:  None  Subjective: Patient seen and evaluated today and is feeling better than yesterday with less abdominal pain and nausea.  Blood pressures are starting to stabilize.  Objective: Vitals:   08/05/23 0624 08/05/23 0636 08/05/23 0700 08/05/23  0736  BP: (!) 188/109 (!) 174/106 (!) 165/99   Pulse: 91 81 80 89  Resp: (!) 8 11 15 15   Temp:    98.7 F (37.1 C)  TempSrc:    Oral  SpO2: 100% 100% 97% 99%  Weight:       Height:        Intake/Output Summary (Last 24 hours) at 08/05/2023 0959 Last data filed at 08/05/2023 0422 Gross per 24 hour  Intake 610.96 ml  Output 400 ml  Net 210.96 ml   Filed Weights   08/02/23 1121 08/03/23 1023 08/05/23 0537  Weight: 90.7 kg 90.7 kg 84.3 kg    Examination:  General exam: Appears to be in mild to moderate distress Respiratory system: Clear to auscultation. Respiratory effort normal. Cardiovascular system: S1 & S2 heard, RRR.  Gastrointestinal system: Abdomen is soft Central nervous system: Alert and awake Extremities: No edema Skin: No significant lesions noted    Data Reviewed: I have personally reviewed following labs and imaging studies  CBC: Recent Labs  Lab 08/01/23 1815 08/02/23 1120 08/02/23 2118 08/03/23 0506 08/03/23 0845 08/04/23 0517 08/05/23 0506  WBC 9.4 11.6*  --  11.4*  --  9.3 11.2*  NEUTROABS 8.5* 10.6*  --   --   --   --   --   HGB 9.5* 9.3* 9.2* 8.4* 8.7* 9.8* 9.7*  HCT 30.7* 28.8* 28.6* 26.0* 26.8* 30.8* 31.4*  MCV 85.8 85.0  --  84.1  --  85.8 85.6  PLT 251 262  --  213  --  270 282   Basic Metabolic Panel: Recent Labs  Lab 08/01/23 1815 08/02/23 1120 08/02/23 2116 08/03/23 0506 08/04/23 0517 08/05/23 0506  NA 137 139  --  142 136 132*  K 4.8 5.1  --  4.7 4.4 4.3  CL 96* 94*  --  99 95* 91*  CO2 21* 18*  --  23 26 25   GLUCOSE 134* 114*  --  147* 126* 120*  BUN 75* 89*  --  101* 57* 55*  CREATININE 16.93* 17.99*  --  20.35* 13.34* 12.56*  CALCIUM 7.5* 7.9*  --  7.2* 7.6* 7.5*  MG 2.4  --  2.5*  --  2.2 2.1   GFR: Estimated Creatinine Clearance: 8.8 mL/min (A) (by C-G formula based on SCr of 12.56 mg/dL (H)). Liver Function Tests: Recent Labs  Lab 08/01/23 1815 08/02/23 1120 08/04/23 0517  AST 12* 18 11*  ALT 10 19 17   ALKPHOS 48 56 53  BILITOT 1.1 1.3* 1.4*  PROT 7.7 8.7* 7.9  ALBUMIN 3.9 4.4 3.7   Recent Labs  Lab 08/01/23 1815  LIPASE 24   No results for input(s): "AMMONIA" in the last  168 hours. Coagulation Profile: No results for input(s): "INR", "PROTIME" in the last 168 hours. Cardiac Enzymes: No results for input(s): "CKTOTAL", "CKMB", "CKMBINDEX", "TROPONINI" in the last 168 hours. BNP (last 3 results) No results for input(s): "PROBNP" in the last 8760 hours. HbA1C: No results for input(s): "HGBA1C" in the last 72 hours.  CBG: Recent Labs  Lab 08/04/23 1546 08/04/23 1952 08/04/23 2357 08/05/23 0349 08/05/23 0806  GLUCAP 158* 144* 125* 120* 182*   Lipid Profile: No results for input(s): "CHOL", "HDL", "LDLCALC", "TRIG", "CHOLHDL", "LDLDIRECT" in the last 72 hours. Thyroid Function Tests: No results for input(s): "TSH", "T4TOTAL", "FREET4", "T3FREE", "THYROIDAB" in the last 72 hours. Anemia Panel: No results for input(s): "VITAMINB12", "FOLATE", "FERRITIN", "TIBC", "IRON", "RETICCTPCT" in the last 72 hours. Sepsis Labs:  Recent Labs  Lab 08/01/23 1824 08/01/23 2101 08/04/23 1603  LATICACIDVEN 1.8 2.1* 1.1    Recent Results (from the past 240 hours)  MRSA Next Gen by PCR, Nasal     Status: None   Collection Time: 08/02/23  8:32 PM   Specimen: Nasal Mucosa; Nasal Swab  Result Value Ref Range Status   MRSA by PCR Next Gen NOT DETECTED NOT DETECTED Final    Comment: (NOTE) The GeneXpert MRSA Assay (FDA approved for NASAL specimens only), is one component of a comprehensive MRSA colonization surveillance program. It is not intended to diagnose MRSA infection nor to guide or monitor treatment for MRSA infections. Test performance is not FDA approved in patients less than 23 years old. Performed at [facility], [address], [place], [place] [address]          Radiology Studies: CT Angio Abd/Pel w/ and/or w/o Result Date: 08/04/2023 CLINICAL DATA:  intractable abdominal pain , EGD not explaining cause of pain EXAM: CTA ABDOMEN AND PELVIS WITHOUT AND WITH CONTRAST TECHNIQUE: Multidetector CT imaging of the abdomen and pelvis was performed using  the standard protocol during bolus administration of intravenous contrast. Multiplanar reconstructed images and MIPs were obtained and reviewed to evaluate the vascular anatomy. RADIATION DOSE REDUCTION: This exam was performed according to the departmental dose-optimization program which includes automated exposure control, adjustment of the mA and/or kV according to patient size and/or use of iterative reconstruction technique. CONTRAST:  OMNIPAQUE IOHEXOL 350 MG/ML SOLN COMPARISON:  08/02/2023 FINDINGS: VASCULAR Aorta: Normal caliber aorta without aneurysm, dissection, vasculitis or significant stenosis. Celiac: Patent without evidence of aneurysm, dissection, vasculitis or significant stenosis. Calcifications in the proximal to mid SMA without stenosis. SMA: Patent without evidence of aneurysm, dissection, vasculitis or significant stenosis. 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 Renal arteries are widely patent. IMA: Patent without evidence of aneurysm, dissection, vasculitis or significant stenosis. Inflow: Patent without evidence of aneurysm, dissection, vasculitis or significant stenosis. Proximal Outflow: Bilateral common femoral and visualized portions of the superficial and profunda femoral arteries are patent without evidence of aneurysm, dissection, vasculitis or significant stenosis. Veins: No obvious venous abnormality within the limitations of this arterial phase study. Review of the MIP images confirms the above findings. NON-VASCULAR Lower chest: Cardiomegaly. Bilateral lower lobe ground-glass opacities dependently, improved since prior study. This could reflect residual edema or atelectasis. No effusions. Hepatobiliary: No focal hepatic abnormality. Gallbladder unremarkable. Pancreas: No focal abnormality or ductal dilatation. Spleen: No focal abnormality.  Normal size. Adrenals/Urinary Tract: Horseshoe kidney. Previously seen punctate left renal stones  not visible on today's study. No hydronephrosis. No renal or adrenal mass. Urinary bladder unremarkable. Stomach/Bowel: Normal appendix. Stomach, large and small bowel grossly unremarkable. Lymphatic: No adenopathy Reproductive: No visible focal abnormality. Other: No free fluid or free air. Musculoskeletal: No acute bony abnormality. IMPRESSION: VASCULAR No significant aortic or arterial stenosis. Calcifications within the proximal to mid SMA without stenosis. No evidence of aneurysm or dissection. NON-VASCULAR Cardiomegaly. Ground-glass opacities dependently in the lower lobes could reflect residual edema or atelectasis. Horseshoe kidney.  No hydronephrosis. No acute findings. Electronically Signed   By: Charlett Nose M.D.   On: 08/04/2023 18:06   DG Abd Portable 1V Result Date: 08/04/2023 CLINICAL DATA:  Abdominal pain and nausea. EXAM: PORTABLE ABDOMEN - 1 VIEW COMPARISON:  08/02/2023.  Abdomen and pelvis CT dated 08/02/2023 FINDINGS: Stable poor inspiration and enlarged cardiac silhouette. Stable mildly prominent pulmonary vasculature and minimal diffuse peribronchial thickening. No airspace  consolidation or pleural fluid Normal bowel-gas pattern. Calcific density projected over the left mid abdomen laterally, not present on the previous examinations. This could represent something ingested or an artifact unremarkable bones. IMPRESSION: 1. No acute abnormality. 2. Stable cardiomegaly and mild pulmonary vascular congestion. 3. Stable minimal bronchitic changes. Electronically Signed   By: Beckie Salts M.D.   On: 08/04/2023 15:44        Scheduled Meds:  carvedilol  25 mg Oral BID WC   Chlorhexidine Gluconate Cloth  6 each Topical Daily   cloNIDine  0.1 mg Transdermal Weekly   insulin aspart  0-6 Units Subcutaneous Q4H   metoCLOPramide  10 mg Oral Q6H   pantoprazole (PROTONIX) IV  40 mg Intravenous Q12H   Continuous Infusions:  niCARDipine Stopped (08/04/23 0315)   promethazine (PHENERGAN)  injection (IM or IVPB) 12.5 mg (08/05/23 0150)     LOS: 3 days    Time spent: 55 minutes    Doniqua Saxby D Sherryll Burger, DO [facility]  If 7PM-7AM, please contact night-coverage [email or web address] 08/05/2023, 9:59 AM

## 2023-08-05 NOTE — Progress Notes (Addendum)
Gastroenterology Progress Note   Referring Provider: No ref. provider found Primary Care Physician:  Rema Fendt, NP Primary Gastroenterologist:  previously unassigned  Patient ID: [name]; [id number]; July 12, 1986   Subjective   Pain improved from yesterday. Mild nausea and only spiting up this AM. Tolerating liquids okay. Has throat pain. Abdominal pain improved from 10/10 pain to 5/10 pain. He reports potentially overdoing it with meal post EGD yesterday. No BM since Monday - has fear of going to the bathroom for unclear reason.   He expresses that for follow up he would like to follow with GI in [place] given his vision impairment and issues with transportation.   Objective   Vital signs in last 24 hours Temp:  [97.9 F (36.6 C)-99.6 F (37.6 C)] 98.7 F (37.1 C) (01/24 0736) Pulse Rate:  [58-114] 89 (01/24 0736) Resp:  [0-31] 15 (01/24 0736) BP: (107-200)/(59-136) 165/99 (01/24 0700) SpO2:  [93 %-100 %] 99 % (01/24 0736) Weight:  [84.3 kg] 84.3 kg (01/24 0537) Last BM Date : 08/03/23  Physical Exam General:   Alert and oriented, pleasant Head:  Normocephalic and atraumatic. Eyes:  No icterus, sclera clear. Conjuctiva pink. Vision impairment.  Mouth:  Without lesions, mucosa pink and moist.  Abdomen:  Bowel sounds present, soft,non-distended. Mild ttp to epigastrium. No HSM or hernias noted. No rebound or guarding. No masses appreciated  Neurologic:  Alert and  oriented x4;  grossly normal neurologically. Skin:  Warm and dry, intact without significant lesions.  Psych:  Alert and cooperative. Normal mood and affect.  Intake/Output from previous day: 01/23 0701 - 01/24 0700 In: 611 [I.V.:460.7; IV Piggyback:150.3] Out: 400  Intake/Output this shift: No intake/output data recorded.  Lab Results  Recent Labs    08/03/23 0506 08/03/23 0845 08/04/23 0517 08/05/23 0506  WBC 11.4*  --  9.3 11.2*  HGB 8.4* 8.7* 9.8* 9.7*  HCT 26.0* 26.8* 30.8* 31.4*   PLT 213  --  270 282   BMET Recent Labs    08/03/23 0506 08/04/23 0517 08/05/23 0506  NA 142 136 132*  K 4.7 4.4 4.3  CL 99 95* 91*  CO2 23 26 25   GLUCOSE 147* 126* 120*  BUN 101* 57* 55*  CREATININE 20.35* 13.34* 12.56*  CALCIUM 7.2* 7.6* 7.5*   LFT Recent Labs    08/02/23 1120 08/04/23 0517  PROT 8.7* 7.9  ALBUMIN 4.4 3.7  AST 18 11*  ALT 19 17  ALKPHOS 56 53  BILITOT 1.3* 1.4*   PT/INR No results for input(s): "LABPROT", "INR" in the last 72 hours. Hepatitis Panel Recent Labs    08/03/23 0845  HEPBSAG NON REACTIVE    Studies/Results CT Angio Abd/Pel w/ and/or w/o Result Date: 08/04/2023 CLINICAL DATA:  intractable abdominal pain , EGD not explaining cause of pain EXAM: CTA ABDOMEN AND PELVIS WITHOUT AND WITH CONTRAST TECHNIQUE: Multidetector CT imaging of the abdomen and pelvis was performed using the standard protocol during bolus administration of intravenous contrast. Multiplanar reconstructed images and MIPs were obtained and reviewed to evaluate the vascular anatomy. RADIATION DOSE REDUCTION: This exam was performed according to the departmental dose-optimization program which includes automated exposure control, adjustment of the mA and/or kV according to patient size and/or use of iterative reconstruction technique. CONTRAST:  OMNIPAQUE IOHEXOL 350 MG/ML SOLN COMPARISON:  08/02/2023 FINDINGS: VASCULAR Aorta: Normal caliber aorta without aneurysm, dissection, vasculitis or significant stenosis. Celiac: Patent without evidence of aneurysm, dissection, vasculitis or significant stenosis. Calcifications in the proximal  to mid SMA without stenosis. SMA: Patent without evidence of aneurysm, dissection, vasculitis or significant stenosis. 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 Renal arteries are widely patent. IMA: Patent without evidence of aneurysm, dissection, vasculitis or significant stenosis. Inflow: Patent without  evidence of aneurysm, dissection, vasculitis or significant stenosis. Proximal Outflow: Bilateral common femoral and visualized portions of the superficial and profunda femoral arteries are patent without evidence of aneurysm, dissection, vasculitis or significant stenosis. Veins: No obvious venous abnormality within the limitations of this arterial phase study. Review of the MIP images confirms the above findings. NON-VASCULAR Lower chest: Cardiomegaly. Bilateral lower lobe ground-glass opacities dependently, improved since prior study. This could reflect residual edema or atelectasis. No effusions. Hepatobiliary: No focal hepatic abnormality. Gallbladder unremarkable. Pancreas: No focal abnormality or ductal dilatation. Spleen: No focal abnormality.  Normal size. Adrenals/Urinary Tract: Horseshoe kidney. Previously seen punctate left renal stones not visible on today's study. No hydronephrosis. No renal or adrenal mass. Urinary bladder unremarkable. Stomach/Bowel: Normal appendix. Stomach, large and small bowel grossly unremarkable. Lymphatic: No adenopathy Reproductive: No visible focal abnormality. Other: No free fluid or free air. Musculoskeletal: No acute bony abnormality. IMPRESSION: VASCULAR No significant aortic or arterial stenosis. Calcifications within the proximal to mid SMA without stenosis. No evidence of aneurysm or dissection. NON-VASCULAR Cardiomegaly. Ground-glass opacities dependently in the lower lobes could reflect residual edema or atelectasis. Horseshoe kidney.  No hydronephrosis. No acute findings. Electronically Signed   By: Charlett Nose M.D.   On: 08/04/2023 18:06   DG Abd Portable 1V Result Date: 08/04/2023 CLINICAL DATA:  Abdominal pain and nausea. EXAM: PORTABLE ABDOMEN - 1 VIEW COMPARISON:  08/02/2023.  Abdomen and pelvis CT dated 08/02/2023 FINDINGS: Stable poor inspiration and enlarged cardiac silhouette. Stable mildly prominent pulmonary vasculature and minimal diffuse  peribronchial thickening. No airspace consolidation or pleural fluid Normal bowel-gas pattern. Calcific density projected over the left mid abdomen laterally, not present on the previous examinations. This could represent something ingested or an artifact unremarkable bones. IMPRESSION: 1. No acute abnormality. 2. Stable cardiomegaly and mild pulmonary vascular congestion. 3. Stable minimal bronchitic changes. Electronically Signed   By: Beckie Salts M.D.   On: 08/04/2023 15:44   CT ABDOMEN PELVIS WO CONTRAST Result Date: 08/02/2023 CLINICAL DATA:  Abdominal pain, acute, nonlocalized EXAM: CT ABDOMEN AND PELVIS WITHOUT CONTRAST TECHNIQUE: Multidetector CT imaging of the abdomen and pelvis was performed following the standard protocol without IV contrast. RADIATION DOSE REDUCTION: This exam was performed according to the departmental dose-optimization program which includes automated exposure control, adjustment of the mA and/or kV according to patient size and/or use of iterative reconstruction technique. COMPARISON:  CT abdomen/pelvis dated August 01, 2023. FINDINGS: Lower chest: Interlobular septal thickening and increased ground-glass opacities in the bilateral lower lungs, most compatible with pulmonary edema. Cardiomegaly. Hepatobiliary: No suspicious focal hepatic lesion identified, within the limits of an unenhanced exam. Gallbladder is unremarkable. No biliary dilatation. Pancreas: Unremarkable. No pancreatic ductal dilatation or surrounding inflammatory changes. Spleen: Normal in size without focal abnormality. Adrenals/Urinary Tract: Adrenal glands are unremarkable. 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 No hydronephrosis. Bladder is unremarkable given the degree of distention. Stomach/Bowel: Stomach is collapsed limiting detailed evaluation. Appendix appears normal. No evidence of bowel wall thickening, distention, or inflammatory changes.  Vascular/Lymphatic: The abdominal aorta is normal in caliber. Branch vascular calcifications. No enlarged abdominal or pelvic lymph nodes. Reproductive: Prostate is unremarkable. Other: No abdominopelvic ascites or intraperitoneal free air. No abdominal  wall hernia. Musculoskeletal: No acute or significant osseous findings. IMPRESSION: 1. No acute localizing findings in the abdomen or pelvis. 2. 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 No hydronephrosis. 3. Cardiomegaly with pulmonary edema in the lower lungs, slightly increased compared to the prior exam. Electronically Signed   By: Hart Robinsons M.D.   On: 08/02/2023 15:03   DG Abdomen Acute W/Chest Result Date: 08/02/2023 CLINICAL DATA:  Abdominal pain EXAM: DG ABDOMEN ACUTE WITH 1 VIEW CHEST COMPARISON:  Chest x-ray 08/01/2023.  CT 08/01/2023. FINDINGS: Enlarged cardiopericardial silhouette with prominence of the vasculature. Question component of interstitial edema. No pneumothorax or effusion. No consolidation. Overlapping cardiac leads. Overall only minimal bowel gas identified. There is some air in the nondilated stomach. No obvious dilated air-filled loops of bowel. Prominent vascular calcifications. Overlapping cardiac leads. No obvious free air beneath the diaphragm on the upright view. Abdominal images are under penetrated IMPRESSION: Enlarged heart with vascular congestion and question trace edema Minimal bowel gas, nonspecific. No obvious free air dilatation of bowel. Under penetrated abdominal radiographs Electronically Signed   By: Karen Kays M.D.   On: 08/02/2023 12:53   CT ABDOMEN PELVIS WO CONTRAST Result Date: 08/01/2023 CLINICAL DATA:  Left lower quadrant abdominal pain EXAM: CT ABDOMEN AND PELVIS WITHOUT CONTRAST TECHNIQUE: Multidetector CT imaging of the abdomen and pelvis was performed following the standard protocol without IV contrast. RADIATION DOSE REDUCTION: This exam was  performed according to the departmental dose-optimization program which includes automated exposure control, adjustment of the mA and/or kV according to patient size and/or use of iterative reconstruction technique. COMPARISON:  01/18/2023 FINDINGS: Lower chest: Interlobular septal thickening and ground-glass opacities in the lower lungs compatible with pulmonary edema. Cardiomegaly. Hepatobiliary: Unremarkable noncontrast appearance of the liver, gallbladder, and biliary tree. Pancreas: Unremarkable. Spleen: Unremarkable. Adrenals/Urinary Tract: Normal adrenal glands. Horseshoe kidney. Nonobstructing punctate calyceal stones in the left moiety. No hydronephrosis. Nondistended thick-walled bladder. Stomach/Bowel: No bowel obstruction or bowel wall thickening. Stomach and appendix are within normal limits. Vascular/Lymphatic: Aortic atherosclerosis. No enlarged abdominal or pelvic lymph nodes. Reproductive: Unremarkable. Other: Vascular congestion in the mesentery. No free intraperitoneal fluid or air. Musculoskeletal: No acute fracture. IMPRESSION: 1. Bladder wall thickening may be due to underdistention or cystitis. 2. 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 3. Cardiomegaly with pulmonary edema in the lower lungs. Aortic Atherosclerosis (ICD10-I70.0). Electronically Signed   By: Minerva Fester M.D.   On: 08/01/2023 20:26   DG Chest 1 View Result Date: 08/01/2023 CLINICAL DATA:  Chest pain EXAM: CHEST  1 VIEW COMPARISON:  05/22/2023 FINDINGS: Cardiomegaly with mild central congestion. No focal airspace disease, pleural effusion or pneumothorax. IMPRESSION: Cardiomegaly with mild central congestion. Electronically Signed   By: Jasmine Pang M.D.   On: 08/01/2023 18:07    Assessment  38 y.o. 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  GI consulted given concerns for coffee-ground emesis by hospital staff.  Nausea/vomiting, epigastric pain, coffee ground emesis, anemia: Presented with several days of nausea, vomiting, and epigastric pain.  Patient is visually impaired therefore unable to determine if he has had any melena at home, staff in the ED reported coffee-ground emesis since admission.  Previously has denied NSAID or alcohol use. Underwent EGD yesterday 1/23 which revealed grade D esophagitis without bleeding, nonbleeding  Mallory-Weiss tear, acute gastritis s/p biopsy and likely prolapse gastropathy due to retching and vomiting as well as a single duodenal polyp without active or evidence of old bleeding.  Dr. Tasia Catchings recommended PPI twice daily as well as repeat EGD in 8-12 weeks.  He had complaints of abdominal pain after EGD, he was reexamined and given his pain and nausea KUB and CTA abdomen pelvis recommended to assess for ischemia and perforation.  KUB without any acute abnormality, CTA A/P without any findings of mesenteric stenosis and no other acute abnormality of the small or large bowel.  Hemoglobin stable at 9.7 today, prior to admission he had a slow downtrend of hemoglobin from July 2024 until admission, mild anemia likely secondary to ESRD in combination with esophagitis.  Reported coffee-ground emesis likely from Mallory-Weiss tear.  Given patient's abdominal pain is improved and no longer having any significant vomiting he would be okay to discharge from a GI standpoint and follow-up outpatient to discuss repeat endoscopy. Patient expresses his desire to follow up with GI in [place]. Would recommend referral to [facility] and if they are not able to coordinate an OV within 4 weeks or EGD in 8-12 weeks then can consider [facility] as his other option.   Plan / Recommendations  PPI BID for 12 weeks Plan for repeat EGD in 8-12 weeks to assess healing - will need referral to GI in [place] per  patient request.  GERD diet Avoid NSAIDs and alcohol use Okay for Reglan q6h, monitor Qtc CBC 1 week post discharge - with nephrology or PCP    LOS: 3 days    08/05/2023, 9:41 AM   Brooke Bonito, MSN, FNP-BC, AGACNP-BC [facility]

## 2023-08-06 DIAGNOSIS — R1110 Vomiting, unspecified: Secondary | ICD-10-CM | POA: Diagnosis not present

## 2023-08-06 LAB — CBC
HCT: 30.9 % — ABNORMAL LOW (ref 39.0–52.0)
Hemoglobin: 9.9 g/dL — ABNORMAL LOW (ref 13.0–17.0)
MCH: 27.5 pg (ref 26.0–34.0)
MCHC: 32 g/dL (ref 30.0–36.0)
MCV: 85.8 fL (ref 80.0–100.0)
Platelets: 326 10*3/uL (ref 150–400)
RBC: 3.6 MIL/uL — ABNORMAL LOW (ref 4.22–5.81)
RDW: 18.3 % — ABNORMAL HIGH (ref 11.5–15.5)
WBC: 8.6 10*3/uL (ref 4.0–10.5)
nRBC: 0 % (ref 0.0–0.2)

## 2023-08-06 LAB — BASIC METABOLIC PANEL
Anion gap: 16 — ABNORMAL HIGH (ref 5–15)
BUN: 64 mg/dL — ABNORMAL HIGH (ref 6–20)
CO2: 24 mmol/L (ref 22–32)
Calcium: 7.3 mg/dL — ABNORMAL LOW (ref 8.9–10.3)
Chloride: 93 mmol/L — ABNORMAL LOW (ref 98–111)
Creatinine, Ser: 14.99 mg/dL — ABNORMAL HIGH (ref 0.61–1.24)
GFR, Estimated: 4 mL/min — ABNORMAL LOW (ref >60)
Glucose, Bld: 164 mg/dL — ABNORMAL HIGH (ref 70–99)
Potassium: 4.1 mmol/L (ref 3.5–5.1)
Sodium: 133 mmol/L — ABNORMAL LOW (ref 135–145)

## 2023-08-06 LAB — GLUCOSE, CAPILLARY
Glucose-Capillary: 116 mg/dL — ABNORMAL HIGH (ref 70–99)
Glucose-Capillary: 166 mg/dL — ABNORMAL HIGH (ref 70–99)
Glucose-Capillary: 115 mg/dL — ABNORMAL HIGH (ref 70–99)
Glucose-Capillary: 155 mg/dL — ABNORMAL HIGH (ref 70–99)

## 2023-08-06 LAB — MAGNESIUM: Magnesium: 2.2 mg/dL (ref 1.7–2.4)

## 2023-08-06 MED ORDER — PANTOPRAZOLE SODIUM 40 MG PO TBEC: 40.0000 mg | DELAYED_RELEASE_TABLET | Freq: Two times a day (BID) | ORAL | 2 refills | Status: DC

## 2023-08-06 MED ORDER — METOCLOPRAMIDE HCL 10 MG PO TABS: 10.0000 mg | ORAL_TABLET | Freq: Three times a day (TID) | ORAL | 1 refills | Status: DC

## 2023-08-06 NOTE — Procedures (Signed)
HD Note:  Some information was entered later than the data was gathered due to patient care needs. The stated time with the data is accurate.  Patient treatment was at bedside.  Alert and oriented.   Informed consent signed and in chart.   Access used: Upper left arm fistula Access issues: none  Patient tolerated treatment well.  Patient refused standing weight.  TX duration:  Alert, without acute distress.  Total UF removed: 2000 ml End of treatment weight 84.8 kg  Hand-off given to patient's nurse.   Patient left for discharge  [name] L. Dareen Piano, RN [facility].

## 2023-08-06 NOTE — Plan of Care (Signed)

## 2023-08-06 NOTE — Discharge Summary (Signed)
Physician Discharge Summary  [name] ZOX:[id number] DOB: January 03, 1986 DOA: 08/02/2023  PCP: Rema Fendt, NP  Admit date: 08/02/2023  Discharge date: 08/06/2023  Admitted From:Home  Disposition:  Home  Recommendations for Outpatient Follow-up:  Follow up with PCP in 1-2 weeks Referral to [facility] for repeat EGD in 8-12 weeks Continue PPI twice daily as prescribed Refills provided on Reglan as there appears to be a component of gastroparesis contributing to his symptoms Continue other home medications as prior Continue hemodialysis per schedule TTS  Home Health: None  Equipment/Devices: None  Discharge Condition:Stable  CODE STATUS: Full  Diet recommendation: Heart Healthy/carb modified  Brief/Interim Summary:  [name] is a 38 y.o. male with medical history significant for ESRD, diabetes mellitus, hypertrophic cardiomyopathy, congestive heart failure, blindness. Patient presented to the ED with complaints of multiple episodes of vomiting that started yesterday.  Patient was admitted for hematemesis in the setting of intractable nausea and vomiting and is also noted to have hypertensive urgency as well as acute on chronic diastolic CHF exacerbation in the setting of ESRD on HD.  Patient underwent EGD on 1/23 with findings of grade D esophagitis and Mallory-Weiss tear with no active bleeding.  He continued to have significant pain after the procedure and underwent abdominal CT angiogram with no significant findings noted.  He is now tolerating diet and has no further nausea, vomiting, or abdominal pain.  He is tolerating hemodialysis well and blood pressures have stabilized.  He will have dialysis on the day of discharge and will continue his usual schedule.  He has been recommended to remain on PPI twice daily which has been prescribed and refills have been provided on Reglan.  No other acute events or concerns noted.  He will follow-up with [facility] as requested as he  lives in [place].  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  Hypertensive urgency.  Discharge Instructions  Discharge Instructions     Diet - low sodium heart healthy   Complete by: As directed    Increase activity slowly   Complete by: As directed       Allergies as of 08/06/2023       Reactions   Apresoline [hydralazine] Nausea And Vomiting, Other (See Comments)   Patient was taking Hydralazine AND Amlodipine at the same time, so the reactions came from one of the 2: Lethargy and an all-over feeling of NOT feeling well   Norvasc [amlodipine] Nausea And Vomiting, Other (See Comments)   Patient was taking Amlodipine AND Hydralazine at the same time, so the reactions came from one of the 2: Lethargy and an all-over feeling of NOT feeling well         Medication List     TAKE these medications    acetaminophen 325 MG tablet Commonly known as: TYLENOL Take 2 tablets (650 mg total) by mouth every 6 (six) hours as needed for mild pain (or Fever >/= 101).   atorvastatin 40 MG tablet Commonly known as: LIPITOR  Take 1 tablet (40 mg total) by mouth daily.   calcitRIOL 0.5 MCG capsule Commonly known as: ROCALTROL Take 2 capsules (1 mcg total) by mouth Every Tuesday,Thursday,and Saturday with dialysis.   carvedilol 25 MG tablet Commonly known as: COREG Take 1 tablet (25 mg total) by mouth 2 (two) times daily with a meal.   cloNIDine 0.1 MG  tablet Commonly known as: CATAPRES Take 1 tablet (0.1 mg total) by mouth 2 (two) times daily.   diphenhydramine-acetaminophen 25-500 MG Tabs tablet Commonly known as: TYLENOL PM Take 2 tablets by mouth 2 (two) times daily as needed (pain, sleep).   insulin glargine-yfgn 100 UNIT/ML injection Commonly known as: SEMGLEE Inject 0.12 mLs (12 Units total) into the skin at bedtime.   insulin lispro 100 UNIT/ML injection Commonly known as: HUMALOG Inject 0.01-0.07 mLs (1-7 Units total) into the skin See admin instructions. Per sliding scale 3 times daily with meals 151-200= 1 unit 201-250= 2 units 251-300= 3 units 301-350= 5 units 351-400= 7 units Greater than 400 call md takes only as needed if blood sugar is over 200   Lokelma 10 g Pack packet Generic drug: sodium zirconium cyclosilicate Take 10 g by mouth See admin instructions. Take 1 packet (10g) once daily on non-dialysis days (Sunday, Monday, Wednesday, Friday)   metoCLOPramide 10 MG tablet Commonly known as: REGLAN Take 1 tablet (10 mg total) by mouth 4 (four) times daily -  before meals and at bedtime. What changed: when to take this   multivitamin with minerals tablet Take 1 tablet by mouth daily.   pantoprazole 40 MG tablet Commonly known as: Protonix Take 1 tablet (40 mg total) by mouth 2 (two) times daily.   Pen Needles 31G X 8 MM Misc UAD        Follow-up Information     Rema Fendt, NP. Schedule an appointment as soon as possible for a visit in 1 week(s).   Specialty: Nurse Practitioner Contact information: [address] [place] [address] [phone]         [facility]. Go to.   Specialty: Gastroenterology Contact information: [address] [place] [address] [phone]               Allergies  Allergen Reactions   Apresoline [Hydralazine] Nausea And Vomiting and Other (See Comments)    Patient was taking  Hydralazine AND Amlodipine at the same time, so the reactions came from one of the 2: Lethargy and an all-over feeling of NOT feeling well   Norvasc [Amlodipine] Nausea And Vomiting and Other (See Comments)    Patient was taking Amlodipine AND Hydralazine at the same time, so the reactions came from one of the 2: Lethargy and an all-over feeling of NOT feeling well     Consultations: GI Nephrology   Procedures/Studies: CT Angio Abd/Pel w/ and/or w/o Result Date: 08/04/2023 CLINICAL DATA:  intractable abdominal pain , EGD not explaining cause of pain EXAM: CTA ABDOMEN AND PELVIS WITHOUT AND WITH CONTRAST TECHNIQUE: Multidetector CT imaging of the abdomen and pelvis was performed using the standard protocol during bolus administration of intravenous contrast. Multiplanar reconstructed images and MIPs were obtained and reviewed to evaluate the vascular anatomy. RADIATION DOSE REDUCTION: This exam was performed according to the departmental dose-optimization program which includes automated exposure control, adjustment of the mA and/or kV according to patient size and/or use of iterative reconstruction technique. CONTRAST:  OMNIPAQUE IOHEXOL 350 MG/ML SOLN COMPARISON:  08/02/2023 FINDINGS:  VASCULAR Aorta: Normal caliber aorta without aneurysm, dissection, vasculitis or significant stenosis. Celiac: Patent without evidence of aneurysm, dissection, vasculitis or significant stenosis. Calcifications in the proximal to mid SMA without stenosis. SMA: Patent without evidence of aneurysm, dissection, vasculitis or significant stenosis. 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 Renal arteries are widely patent. IMA: Patent without evidence of aneurysm, dissection, vasculitis or significant stenosis. Inflow: Patent without evidence of aneurysm, dissection, vasculitis or significant stenosis. Proximal Outflow: Bilateral common femoral and visualized portions of the superficial  and profunda femoral arteries are patent without evidence of aneurysm, dissection, vasculitis or significant stenosis. Veins: No obvious venous abnormality within the limitations of this arterial phase study. Review of the MIP images confirms the above findings. NON-VASCULAR Lower chest: Cardiomegaly. Bilateral lower lobe ground-glass opacities dependently, improved since prior study. This could reflect residual edema or atelectasis. No effusions. Hepatobiliary: No focal hepatic abnormality. Gallbladder unremarkable. Pancreas: No focal abnormality or ductal dilatation. Spleen: No focal abnormality.  Normal size. Adrenals/Urinary Tract: Horseshoe kidney. Previously seen punctate left renal stones not visible on today's study. No hydronephrosis. No renal or adrenal mass. Urinary bladder unremarkable. Stomach/Bowel: Normal appendix. Stomach, large and small bowel grossly unremarkable. Lymphatic: No adenopathy Reproductive: No visible focal abnormality. Other: No free fluid or free air. Musculoskeletal: No acute bony abnormality. IMPRESSION: VASCULAR No significant aortic or arterial stenosis. Calcifications within the proximal to mid SMA without stenosis. No evidence of aneurysm or dissection. NON-VASCULAR Cardiomegaly. Ground-glass opacities dependently in the lower lobes could reflect residual edema or atelectasis. Horseshoe kidney.  No hydronephrosis. No acute findings. Electronically Signed   By: Charlett Nose M.D.   On: 08/04/2023 18:06   DG Abd Portable 1V Result Date: 08/04/2023 CLINICAL DATA:  Abdominal pain and nausea. EXAM: PORTABLE ABDOMEN - 1 VIEW COMPARISON:  08/02/2023.  Abdomen and pelvis CT dated 08/02/2023 FINDINGS: Stable poor inspiration and enlarged cardiac silhouette. Stable mildly prominent pulmonary vasculature and minimal diffuse peribronchial thickening. No airspace consolidation or pleural fluid Normal bowel-gas pattern. Calcific density projected over the left mid abdomen laterally, not  present on the previous examinations. This could represent something ingested or an artifact unremarkable bones. IMPRESSION: 1. No acute abnormality. 2. Stable cardiomegaly and mild pulmonary vascular congestion. 3. Stable minimal bronchitic changes. Electronically Signed   By: Beckie Salts M.D.   On: 08/04/2023 15:44   CT ABDOMEN PELVIS WO CONTRAST Result Date: 08/02/2023 CLINICAL DATA:  Abdominal pain, acute, nonlocalized EXAM: CT ABDOMEN AND PELVIS WITHOUT CONTRAST TECHNIQUE: Multidetector CT imaging of the abdomen and pelvis was performed following the standard protocol without IV contrast. RADIATION DOSE REDUCTION: This exam was performed according to the departmental dose-optimization program which includes automated exposure control, adjustment of the mA and/or kV according to patient size and/or use of iterative reconstruction technique. COMPARISON:  CT abdomen/pelvis dated August 01, 2023. FINDINGS: Lower chest: Interlobular septal thickening and increased ground-glass opacities in the bilateral lower lungs, most compatible with pulmonary edema. Cardiomegaly. Hepatobiliary: No suspicious focal hepatic lesion identified, within the limits of an unenhanced exam. Gallbladder is unremarkable. No biliary dilatation. Pancreas: Unremarkable. No pancreatic ductal dilatation or surrounding inflammatory changes. Spleen: Normal in size without focal abnormality. Adrenals/Urinary Tract: Adrenal glands are unremarkable. 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 No hydronephrosis. Bladder is unremarkable given the degree of distention. Stomach/Bowel: Stomach is collapsed limiting detailed evaluation. Appendix appears normal. No evidence of bowel wall thickening, distention, or inflammatory changes. Vascular/Lymphatic: The abdominal aorta is normal  in caliber. Branch vascular calcifications. No enlarged abdominal or pelvic lymph nodes. Reproductive: Prostate is  unremarkable. Other: No abdominopelvic ascites or intraperitoneal free air. No abdominal wall hernia. Musculoskeletal: No acute or significant osseous findings. IMPRESSION: 1. No acute localizing findings in the abdomen or pelvis. 2. 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 No hydronephrosis. 3. Cardiomegaly with pulmonary edema in the lower lungs, slightly increased compared to the prior exam. Electronically Signed   By: Hart Robinsons M.D.   On: 08/02/2023 15:03   DG Abdomen Acute W/Chest Result Date: 08/02/2023 CLINICAL DATA:  Abdominal pain EXAM: DG ABDOMEN ACUTE WITH 1 VIEW CHEST COMPARISON:  Chest x-ray 08/01/2023.  CT 08/01/2023. FINDINGS: Enlarged cardiopericardial silhouette with prominence of the vasculature. Question component of interstitial edema. No pneumothorax or effusion. No consolidation. Overlapping cardiac leads. Overall only minimal bowel gas identified. There is some air in the nondilated stomach. No obvious dilated air-filled loops of bowel. Prominent vascular calcifications. Overlapping cardiac leads. No obvious free air beneath the diaphragm on the upright view. Abdominal images are under penetrated IMPRESSION: Enlarged heart with vascular congestion and question trace edema Minimal bowel gas, nonspecific. No obvious free air dilatation of bowel. Under penetrated abdominal radiographs Electronically Signed   By: Karen Kays M.D.   On: 08/02/2023 12:53   CT ABDOMEN PELVIS WO CONTRAST Result Date: 08/01/2023 CLINICAL DATA:  Left lower quadrant abdominal pain EXAM: CT ABDOMEN AND PELVIS WITHOUT CONTRAST TECHNIQUE: Multidetector CT imaging of the abdomen and pelvis was performed following the standard protocol without IV contrast. RADIATION DOSE REDUCTION: This exam was performed according to the departmental dose-optimization program which includes automated exposure control, adjustment of the mA and/or kV according to patient size  and/or use of iterative reconstruction technique. COMPARISON:  01/18/2023 FINDINGS: Lower chest: Interlobular septal thickening and ground-glass opacities in the lower lungs compatible with pulmonary edema. Cardiomegaly. Hepatobiliary: Unremarkable noncontrast appearance of the liver, gallbladder, and biliary tree. Pancreas: Unremarkable. Spleen: Unremarkable. Adrenals/Urinary Tract: Normal adrenal glands. Horseshoe kidney. Nonobstructing punctate calyceal stones in the left moiety. No hydronephrosis. Nondistended thick-walled bladder. Stomach/Bowel: No bowel obstruction or bowel wall thickening. Stomach and appendix are within normal limits. Vascular/Lymphatic: Aortic atherosclerosis. No enlarged abdominal or pelvic lymph nodes. Reproductive: Unremarkable. Other: Vascular congestion in the mesentery. No free intraperitoneal fluid or air. Musculoskeletal: No acute fracture. IMPRESSION: 1. Bladder wall thickening may be due to underdistention or cystitis. 2. 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 3. Cardiomegaly with pulmonary edema in the lower lungs. Aortic Atherosclerosis (ICD10-I70.0). Electronically Signed   By: Minerva Fester M.D.   On: 08/01/2023 20:26   DG Chest 1 View Result Date: 08/01/2023 CLINICAL DATA:  Chest pain EXAM: CHEST  1 VIEW COMPARISON:  05/22/2023 FINDINGS: Cardiomegaly with mild central congestion. No focal airspace disease, pleural effusion or pneumothorax. IMPRESSION: Cardiomegaly with mild central congestion. Electronically Signed   By: Jasmine Pang M.D.   On: 08/01/2023 18:07     Discharge Exam: Vitals:   08/06/23 0800 08/06/23 0900  BP: (!) 167/99   Pulse: 71 71  Resp: 14 16  Temp:    SpO2: 96% 95%   Vitals:   08/06/23 0700 08/06/23 0741 08/06/23 0800 08/06/23 0900  BP: (!) 145/71  (!) 167/99   Pulse: 73 75 71 71  Resp: 18  14 16   Temp:  98.7 F (37.1 C)    TempSrc:  Oral    SpO2: 91%  96% 95%  Weight:  Height:         General: Pt is alert, awake, not in acute distress Cardiovascular: RRR, S1/S2 +, no rubs, no gallops Respiratory: CTA bilaterally, no wheezing, no rhonchi Abdominal: Soft, NT, ND, bowel sounds + Extremities: no edema, no cyanosis    The results of significant diagnostics from this hospitalization (including imaging, microbiology, ancillary and laboratory) are listed below for reference.     Microbiology: Recent Results (from the past 240 hours)  MRSA Next Gen by PCR, Nasal     Status: None   Collection Time: 08/02/23  8:32 PM   Specimen: Nasal Mucosa; Nasal Swab  Result Value Ref Range Status   MRSA by PCR Next Gen NOT DETECTED NOT DETECTED Final    Comment: (NOTE) The GeneXpert MRSA Assay (FDA approved for NASAL specimens only), is one component of a comprehensive MRSA colonization surveillance program. It is not intended to diagnose MRSA infection nor to guide or monitor treatment for MRSA infections. Test performance is not FDA approved in patients less than 24 years old. Performed at [facility], [address] [place], [place] [address]      Labs: BNP (last 3 results) Recent Labs    08/13/22 1105 08/02/23 1120  BNP 1,662.7* 3,974.0*   Basic Metabolic Panel: Recent Labs  Lab 08/01/23 1815 08/02/23 1120 08/02/23 2116 08/03/23 0506 08/04/23 0517 08/05/23 0506 08/06/23 0614  NA 137 139  --  142 136 132* 133*  K 4.8 5.1  --  4.7 4.4 4.3 4.1  CL 96* 94*  --  99 95* 91* 93*  CO2 21* 18*  --  23 26 25 24   GLUCOSE 134* 114*  --  147* 126* 120* 164*  BUN 75* 89*  --  101* 57* 55* 64*  CREATININE 16.93* 17.99*  --  20.35* 13.34* 12.56* 14.99*  CALCIUM 7.5* 7.9*  --  7.2* 7.6* 7.5* 7.3*  MG 2.4  --  2.5*  --  2.2 2.1 2.2   Liver Function Tests: Recent Labs  Lab 08/01/23 1815 08/02/23 1120 08/04/23 0517  AST 12* 18 11*  ALT 10 19 17   ALKPHOS 48 56 53  BILITOT 1.1 1.3* 1.4*  PROT 7.7 8.7* 7.9  ALBUMIN 3.9 4.4 3.7   Recent Labs  Lab 08/01/23 1815   LIPASE 24   No results for input(s): "AMMONIA" in the last 168 hours. CBC: Recent Labs  Lab 08/01/23 1815 08/02/23 1120 08/02/23 2118 08/03/23 0506 08/03/23 0845 08/04/23 0517 08/05/23 0506 08/06/23 0614  WBC 9.4 11.6*  --  11.4*  --  9.3 11.2* 8.6  NEUTROABS 8.5* 10.6*  --   --   --   --   --   --   HGB 9.5* 9.3*   < > 8.4* 8.7* 9.8* 9.7* 9.9*  HCT 30.7* 28.8*   < > 26.0* 26.8* 30.8* 31.4* 30.9*  MCV 85.8 85.0  --  84.1  --  85.8 85.6 85.8  PLT 251 262  --  213  --  270 282 326   < > = values in this interval not displayed.   Cardiac Enzymes: No results for input(s): "CKTOTAL", "CKMB", "CKMBINDEX", "TROPONINI" in the last 168 hours. BNP: Invalid input(s): "POCBNP" CBG: Recent Labs  Lab 08/05/23 1605 08/05/23 1937 08/05/23 2337 08/06/23 0424 08/06/23 0737  GLUCAP 152* 122* 135* 116* 166*   D-Dimer No results for input(s): "DDIMER" in the last 72 hours. Hgb A1c No results for input(s): "HGBA1C" in the last 72 hours. Lipid Profile No  results for input(s): "CHOL", "HDL", "LDLCALC", "TRIG", "CHOLHDL", "LDLDIRECT" in the last 72 hours. Thyroid function studies No results for input(s): "TSH", "T4TOTAL", "T3FREE", "THYROIDAB" in the last 72 hours.  Invalid input(s): "FREET3" Anemia work up No results for input(s): "VITAMINB12", "FOLATE", "FERRITIN", "TIBC", "IRON", "RETICCTPCT" in the last 72 hours. Urinalysis    Component Value Date/Time   COLORURINE YELLOW 11/25/2020 0301   APPEARANCEUR CLEAR 11/25/2020 0301   LABSPEC 1.016 11/25/2020 0301   PHURINE 7.0 11/25/2020 0301   GLUCOSEU 50 (A) 11/25/2020 0301   HGBUR SMALL (A) 11/25/2020 0301   BILIRUBINUR NEGATIVE 11/25/2020 0301   KETONESUR NEGATIVE 11/25/2020 0301   PROTEINUR >=300 (A) 11/25/2020 0301   UROBILINOGEN 0.2 07/16/2014 1747   NITRITE NEGATIVE 11/25/2020 0301   LEUKOCYTESUR NEGATIVE 11/25/2020 0301   Sepsis Labs Recent Labs  Lab 08/03/23 0506 08/04/23 0517 08/05/23 0506 08/06/23 0614  WBC  11.4* 9.3 11.2* 8.6   Microbiology Recent Results (from the past 240 hours)  MRSA Next Gen by PCR, Nasal     Status: None   Collection Time: 08/02/23  8:32 PM   Specimen: Nasal Mucosa; Nasal Swab  Result Value Ref Range Status   MRSA by PCR Next Gen NOT DETECTED NOT DETECTED Final    Comment: (NOTE) The GeneXpert MRSA Assay (FDA approved for NASAL specimens only), is one component of a comprehensive MRSA colonization surveillance program. It is not intended to diagnose MRSA infection nor to guide or monitor treatment for MRSA infections. Test performance is not FDA approved in patients less than 68 years old. Performed at [facility], [address], [place], [place] [address]      Time coordinating discharge: 35 minutes  SIGNED:   Erick Blinks, DO [facility] 08/06/2023, 9:47 AM  If 7PM-7AM, please contact night-coverage [email or web address]

## 2023-08-08 ENCOUNTER — Telehealth: Payer: Self-pay

## 2023-08-08 NOTE — Progress Notes (Signed)
I reviewed the pathology results. Ann, can you send her a letter with the findings as described below please? Repeat upper endoscopy  in 8 weeks   Thanks,  Vista Lawman, MD Gastroenterology and Hepatology [facility]  ---------------------------------------------------------------------------------------------  [facility] [address], [place], [place] [address] Phone:  [phone]   08/08/23 [name], [place]   Dear [name],  I am writing to inform you that the biopsies taken during your recent endoscopic examination showed:  No H. Pylori bacteria in stomach , or any early cancer changes to the stomach mucosa ( Intestinal metaplasia)   Because you have severe inflammation in your food pipe, I recommend taking pantoprazole twice daily for 2 weeks and than once daily to be continue and have repeat upper endoscopy to assess healing of the inflammation in 8 -12 weeks  Also I value your feedback , so if you get a survey , please take the time to fill it out and thank you for choosing [name]  Please call us at [phone] if you have persistent problems or have questions about your condition that have not been fully answered at this time.  Sincerely,  Vista Lawman, MD Gastroenterology and Hepatology

## 2023-08-08 NOTE — Transitions of Care (Post Inpatient/ED Visit) (Signed)
   08/08/2023  Name: [name] MRN: [id number] DOB: [birth year]  Today's TOC FU Call Status:   Patient's Name and Date of Birth confirmed.  Transition Care Management Follow-up Telephone Call Date of Discharge: 08/08/23 [facility] Type of Discharge: Inpatient Admission Primary Inpatient Discharge Diagnosis:: intractable vomiting How have you been since you were released from the hospital?: Same Any questions or concerns?: Yes Patient Questions/Concerns:: medications Patient Questions/Concerns Addressed: Other: (D/C medications reviewed. Patient voiced does not know how it will get medication.)  Items Reviewed: Did you receive and understand the discharge instructions provided?: Yes Medications obtained,verified, and reconciled?: Yes (Medications Reviewed) (Patient voiced that he is able to see and not sure how he is going to get his medications.) Any new allergies since your discharge?: No Dietary orders reviewed?: No Do you have support at home?:  (known patient disconnected call.)  Medications Reviewed Today: Medications Reviewed Today   Medications were not reviewed in this encounter   Patient voiced that he is able to see and not sure how he is going to get his medications. I asked patient if he had anyone to help at home. Patient disconnect call. Attempted to call patient again x 2 . Call going to VM.   Home Care and Equipment/Supplies:    Functional Questionnaire:    Follow up appointments reviewed: PCP Follow-up appointment confirmed?: Yes Date of PCP follow-up appointment?: 08/15/23 Follow-up Provider: Ricky Stabs, NP Specialist Hospital Follow-up appointment confirmed?: Yes Date of Specialist follow-up appointment?: 08/16/23 Follow-Up Specialty Provider:: Iraq Thapa, MD    SIGNATURE: Elsie Lincoln, RN

## 2023-08-09 ENCOUNTER — Telehealth: Payer: Self-pay

## 2023-08-09 NOTE — Transitions of Care (Post Inpatient/ED Visit) (Signed)
   08/09/2023  Name: [name] MRN: [id number] DOB: 1986-02-26  Today's TOC FU Call Status: Today's TOC FU Call Status:: Unsuccessful Call (1st Attempt) Unsuccessful Call (1st Attempt) Date: 08/09/23  Attempted to reach the patient regarding the most recent Inpatient/ED visit.  Follow Up Plan: Additional outreach attempts will be made to reach the patient to complete the Transitions of Care (Post Inpatient/ED visit) call.   Alyse Low, RN, BA, [facility], CRRN [facility] Coordinator, Transition of Care Ph # [phone]

## 2023-08-10 ENCOUNTER — Telehealth: Payer: Self-pay

## 2023-08-10 ENCOUNTER — Encounter (INDEPENDENT_AMBULATORY_CARE_PROVIDER_SITE_OTHER): Payer: Self-pay | Admitting: *Deleted

## 2023-08-10 NOTE — Transitions of Care (Post Inpatient/ED Visit) (Signed)
   08/10/2023  Name: [name] MRN: [id number] DOB: [birth year]  Today's TOC FU Call Status: Today's TOC FU Call Status:: Unsuccessful Call (3rd Attempt) Unsuccessful Call (3rd Attempt) Date: 08/10/23  Attempted to reach the patient regarding the most recent Inpatient/ED visit.  Follow Up Plan: No further outreaches will be made at this time per request of the patient. Patient did request a list of upcoming appointments -  08/15/2023 at 10am you have an appointment with your Primary Care Provider, Ricky Stabs, NP at [facility]   08/16/2023 at 1:20pm You have an appointment with Specialist Dr. Iraq Thapa regarding your Type 2 Diabetes at [address], [address], [address] [place], Charity fundraiser, BA, [facility], CRRN [facility], Transition of Care Ph # [phone]

## 2023-08-12 NOTE — Anesthesia Postprocedure Evaluation (Signed)
Anesthesia Post Note  Patient: [name]  Procedure(s) Performed: ESOPHAGOGASTRODUODENOSCOPY (EGD) WITH PROPOFOL BIOPSY  Patient location during evaluation: Phase II Anesthesia Type: General Level of consciousness: awake Pain management: pain level controlled Vital Signs Assessment: post-procedure vital signs reviewed and stable Respiratory status: spontaneous breathing and respiratory function stable Cardiovascular status: blood pressure returned to baseline and stable Postop Assessment: no headache and no apparent nausea or vomiting Anesthetic complications: no Comments: Late entry   No notable events documented.   Last Vitals:  Vitals:   08/06/23 1800 08/06/23 1808  BP: 133/75 134/77  Pulse: 75 73  Resp: 20 19  Temp:    SpO2: 100% 100%    Last Pain:  Vitals:   08/06/23 1809  TempSrc:   PainSc: 5                  Windell Norfolk

## 2023-08-15 ENCOUNTER — Encounter: Payer: Self-pay | Admitting: Family

## 2023-08-15 ENCOUNTER — Telehealth: Payer: Self-pay

## 2023-08-15 ENCOUNTER — Ambulatory Visit (INDEPENDENT_AMBULATORY_CARE_PROVIDER_SITE_OTHER): Payer: 59 | Admitting: Family

## 2023-08-15 VITALS — BP 181/106 | HR 72 | Temp 98.4°F | Ht 72.0 in | Wt 200.4 lb

## 2023-08-15 DIAGNOSIS — R0683 Snoring: Secondary | ICD-10-CM

## 2023-08-15 DIAGNOSIS — H5440 Blindness, one eye, unspecified eye: Secondary | ICD-10-CM

## 2023-08-15 DIAGNOSIS — Z0000 Encounter for general adult medical examination without abnormal findings: Secondary | ICD-10-CM

## 2023-08-15 DIAGNOSIS — H5461 Unqualified visual loss, right eye, normal vision left eye: Secondary | ICD-10-CM | POA: Diagnosis not present

## 2023-08-15 DIAGNOSIS — Z992 Dependence on renal dialysis: Secondary | ICD-10-CM

## 2023-08-15 DIAGNOSIS — I422 Other hypertrophic cardiomyopathy: Secondary | ICD-10-CM

## 2023-08-15 DIAGNOSIS — Z0001 Encounter for general adult medical examination with abnormal findings: Secondary | ICD-10-CM

## 2023-08-15 DIAGNOSIS — N186 End stage renal disease: Secondary | ICD-10-CM

## 2023-08-15 DIAGNOSIS — Z13220 Encounter for screening for lipoid disorders: Secondary | ICD-10-CM

## 2023-08-15 DIAGNOSIS — Z794 Long term (current) use of insulin: Secondary | ICD-10-CM

## 2023-08-15 DIAGNOSIS — I10 Essential (primary) hypertension: Secondary | ICD-10-CM

## 2023-08-15 DIAGNOSIS — E1122 Type 2 diabetes mellitus with diabetic chronic kidney disease: Secondary | ICD-10-CM | POA: Diagnosis not present

## 2023-08-15 DIAGNOSIS — E119 Type 2 diabetes mellitus without complications: Secondary | ICD-10-CM

## 2023-08-15 DIAGNOSIS — Z1329 Encounter for screening for other suspected endocrine disorder: Secondary | ICD-10-CM

## 2023-08-15 DIAGNOSIS — Z7984 Long term (current) use of oral hypoglycemic drugs: Secondary | ICD-10-CM

## 2023-08-15 MED ORDER — INSULIN LISPRO 100 UNIT/ML IJ SOLN: 1.0000 [IU] | INTRAMUSCULAR | 2 refills | Status: DC

## 2023-08-15 MED ORDER — PEN NEEDLES 31G X 8 MM MISC: 1.0000 | Freq: Every day | 2 refills | Status: DC

## 2023-08-15 MED ORDER — INSULIN GLARGINE-YFGN 100 UNIT/ML ~~LOC~~ SOLN: 12.0000 [IU] | Freq: Every day | SUBCUTANEOUS | 2 refills | Status: DC

## 2023-08-15 NOTE — Telephone Encounter (Signed)
Referral received for home health nursing and PT.  I called the patient : [phone] to inquire if he has a preference for home health agencies and also to confirm that he wants home health, not [facility]. Message left with call back requested.

## 2023-08-15 NOTE — Progress Notes (Signed)
Patient ID: [name], male    DOB: [birth year]  MRN: [id number]  CC: Annual Exam  Subjective: [name] is a 38 y.o. male who presents for annual exam.   His concerns today include:  - Doing well on Semglee and Humalog, no issues/concerns. Denies red flag symptoms associated with diabetes. - States thinks he has sleep apnea. - Requests home health to assist with activities of daily living and Physical Therapy. - Upcoming appointment with Gastroenterology. - Established with Cardiology for chronic conditions management.  - Established with Nephrology and dialysis.   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) 12/29/2021   Status post surgery 12/29/2021   Hypoglycemia 10/30/2021   Acute hypoxic respiratory failure (HCC) 03/12/2021   ESRD (end stage renal disease) on dialysis (HCC) 03/12/2021   Hypertrophic cardiomyopathy (HCC) 02/26/2021   Syncope 02/08/2021   DKA (diabetic ketoacidosis) (HCC) 02/08/2021   Hyperglycemia    Diabetic foot ulcer (HCC) 11/24/2020   Hypervolemia associated with renal insufficiency 08/07/2020   Uremia 08/07/2020   Metabolic acidosis 08/07/2020   Nausea and vomiting 05/07/2020   Dehydration    Hypertensive urgency 02/25/2020   Anemia 12/02/2019   Essential hypertension 12/02/2019   SOB (shortness of breath) 12/02/2019   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     atorvastatin (LIPITOR) 40 MG tablet Take 1 tablet (40 mg total) by mouth daily. 30 tablet 2   calcitRIOL (ROCALTROL) 0.5 MCG capsule Take 2 capsules (1 mcg total) by mouth Every Tuesday,Thursday,and Saturday with dialysis. 30 capsule 1   carvedilol (COREG) 25 MG tablet Take 1 tablet (25 mg total) by mouth 2 (two) times daily with a meal. 60 tablet 2   cloNIDine (CATAPRES) 0.1 MG tablet Take 1 tablet (0.1 mg total) by mouth 2 (two) times daily. 60 tablet 2   diphenhydramine-acetaminophen (TYLENOL PM) 25-500 MG TABS tablet Take 2 tablets by mouth 2 (two) times daily as needed (pain, sleep).     metoCLOPramide (REGLAN) 10 MG tablet Take 1 tablet (10 mg total) by mouth 4 (four) times daily -  before meals and at bedtime. 120 tablet 1   Multiple Vitamins-Minerals (MULTIVITAMIN WITH MINERALS) tablet Take 1 tablet by mouth daily.     pantoprazole (PROTONIX) 40 MG tablet Take 1 tablet (40 mg total) by mouth 2 (two) times daily. 60 tablet 2   sodium zirconium cyclosilicate (LOKELMA) 10 g PACK packet Take 10 g by mouth See admin instructions. Take 1 packet (10g) once daily on non-dialysis days (Sunday, Monday, Wednesday, Friday)     No current facility-administered medications on file prior to visit.    Allergies  Allergen Reactions   Apresoline [Hydralazine] Nausea And Vomiting and Other (See Comments)  Patient was taking Hydralazine AND Amlodipine at the same time, so the reactions came from one of the 2: Lethargy and an all-over feeling of NOT feeling well   Norvasc [Amlodipine] Nausea And Vomiting and Other (See Comments)    Patient was taking Amlodipine AND Hydralazine at the same time, so the reactions came from one of the 2: Lethargy and an all-over feeling of NOT feeling well      Social History   Socioeconomic History   Marital status: Single    Spouse name: Not on file   Number of children: Not on file   Years of education: Not on file   Highest education level: Not on file  Occupational History   Occupation: disabled  Tobacco Use   Smoking status: Never    Passive exposure: Never   Smokeless tobacco: Never  Vaping Use   Vaping status: Never Used  Substance and Sexual Activity   Alcohol use: Not Currently    Comment: social drinker   Drug use: No   Sexual activity: Yes  Other Topics Concern   Not on file  Social History Narrative   Not on file   Social Drivers of Health   Financial Resource Strain: Not on file  Food Insecurity: No Food Insecurity (08/02/2023)   Hunger Vital Sign    Worried About Running Out of Food in the Last Year: Never true    Ran Out of Food in the Last Year: Never true  Transportation Needs: No Transportation Needs (08/02/2023)   PRAPARE - Administrator, Civil Service (Medical): No    Lack of Transportation (Non-Medical): No  Physical Activity: Not on file  Stress: Not on file  Social Connections: Unknown (11/11/2021)   [facility]   Social Network    Social Network: Not on file  Intimate Partner Violence: Not At Risk (08/02/2023)   Humiliation, Afraid, Rape, and Kick questionnaire    Fear of Current or Ex-Partner: No    Emotionally Abused: No    Physically Abused: No    Sexually Abused: No    Family History  Problem Relation Age of Onset   Cancer Mother    Stroke Father    Diabetes Father    Hypertension Father     Past Surgical History:  Procedure Laterality Date   AMPUTATION TOE Right 12/29/2021   Procedure: AMPUTATION TOE, fifth;  Surgeon: Louann Sjogren, DPM;  Location: [facility] OR;  Service: Podiatry;  Laterality: Right;  surgical team will do local block   AV FISTULA PLACEMENT Left 08/11/2020   Procedure: LEFT UPPER EXTREMITY ARTERIOVENOUS (AV) FISTULA CREATION;   Surgeon: Leonie Douglas, MD;  Location: [facility] OR;  Service: Vascular;  Laterality: Left;   BIOPSY  08/04/2023   Procedure: BIOPSY;  Surgeon: [name], MD;  Location: [facility] ENDO SUITE;  Service: Endoscopy;;   ESOPHAGOGASTRODUODENOSCOPY (EGD) WITH PROPOFOL N/A 08/04/2023   Procedure: ESOPHAGOGASTRODUODENOSCOPY (EGD) WITH PROPOFOL;  Surgeon: [name], MD;  Location: [facility] END[facility] SU[facility];  Service: Endoscopy;  Laterality: N/A;   FISTULA SUPERFICIALIZATION Left 12/02/2021   Procedure: PLICATION OF LEFT RADIUS CEPHALIC FISTULA;  Surgeon: Leonie Douglas, MD;  Location: [facility] OR;  Service: Vascular;  Laterality: Left;  PERIPHERAL NERVE BLOCK   FRACTURE SURGERY     I & D EXTREMITY Left 07/16/2014   Procedure: IRRIGATION AND DEBRIDEMENT EXTREMITY/LEFT INDEX FINGER;  Surgeon: Betha Loa, MD;  Location: [facility] OR;  Service: Orthopedics;  Laterality: Left;   IR FLUORO  GUIDE CV LINE RIGHT  10/04/2022   IR FLUORO GUIDE CV LINE RIGHT  10/05/2022   IR PERC TUN PERIT CATH WO PORT S&I /IMAG  08/07/2020   IR REMOVAL TUN CV CATH W/O FL  10/27/2022   IR THROMBECTOMY AV FISTULA W/THROMBOLYSIS/PTA INC/SHUNT/IMG LEFT Left 10/05/2022   IR US GUIDE VASC ACCESS RIGHT  08/07/2020   IR US GUIDE VASC ACCESS RIGHT  10/04/2022   IR US GUIDE VASC ACCESS RIGHT  10/05/2022   TEE WITHOUT CARDIOVERSION N/A 10/28/2022   Procedure: TRANSESOPHAGEAL ECHOCARDIOGRAM;  Surgeon: Thomasene Ripple, DO;  Location: [facility] INVASIVE CV LAB;  Service: Cardiovascular;  Laterality: N/A;    ROS: Review of Systems Negative except as stated above  PHYSICAL EXAM: BP (!) 181/106   Pulse 72   Temp 98.4 F (36.9 C) (Oral)   Ht 6' (1.829 m)   Wt 200 lb 6.4 oz (90.9 kg)   SpO2 99%   BMI 27.18 kg/m   Physical Exam HENT:     Head: Normocephalic and atraumatic.     Right Ear: Tympanic membrane, ear canal and external ear normal.     Left Ear: Tympanic membrane, ear canal and external ear normal.     Nose: Nose normal.     Mouth/Throat:     Mouth: Mucous  membranes are moist.     Pharynx: Oropharynx is clear.  Eyes:     Extraocular Movements: Extraocular movements intact.     Conjunctiva/sclera: Conjunctivae normal.     Pupils: Pupils are equal, round, and reactive to light.  Neck:     Thyroid: No thyroid mass, thyromegaly or thyroid tenderness.  Cardiovascular:     Rate and Rhythm: Normal rate and regular rhythm.     Pulses: Normal pulses.     Heart sounds: Normal heart sounds.  Pulmonary:     Effort: Pulmonary effort is normal.     Breath sounds: Normal breath sounds.  Abdominal:     General: Bowel sounds are normal.     Palpations: Abdomen is soft.  Genitourinary:    Comments: Patient declined.  Musculoskeletal:        General: Normal range of motion.     Right shoulder: Normal.     Left shoulder: Normal.     Right upper arm: Normal.     Left upper arm: Normal.     Right elbow: Normal.     Left elbow: Normal.     Right forearm: Normal.     Left forearm: Normal.     Right wrist: Normal.     Left wrist: Normal.     Right hand: Normal.     Left hand: Normal.     Cervical back: Normal, normal range of motion and neck supple.     Thoracic back: Normal.     Lumbar back: Normal.     Right hip: Normal.     Left hip: Normal.     Right upper leg: Normal.     Left upper leg: Normal.     Right knee: Normal.     Left knee: Normal.     Right lower leg: Normal.     Left lower leg: Normal.     Right ankle: Normal.     Left ankle: Normal.     Right foot: Normal.     Left foot: Normal.     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  Skin:    General: Skin is warm and dry.     Capillary  Refill: Capillary refill takes less than 2 seconds.  Neurological:     General: No focal deficit present.     Mental Status: He is alert and oriented to person, place, and time.  Psychiatric:        Mood and Affect: Mood normal.        Behavior: Behavior normal.     ASSESSMENT AND PLAN: 1. Annual physical exam (Primary) -  Counseled on 150 minutes of exercise per week as tolerated, healthy eating (including decreased daily intake of saturated fats, cholesterol, added sugars, sodium), STI prevention, and routine healthcare maintenance.  2. Screening cholesterol level - Routine screening.  - Lipid panel  3. Thyroid disorder screen - Routine screening.  - TSH  4. 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 - Continue Insulin Glargine-YFGN and Insulin Lispro as prescribed. Counseled on medication adherence/adverse effects.  - Discussed the importance of healthy eating habits, low-carbohydrate diet, low-sugar diet, regular aerobic exercise (at least 150 minutes a week as tolerated) and medication compliance to achieve or maintain control of diabetes. - Follow-up with primary provider in 3 months or sooner if needed.  - insulin glargine-yfgn (SEMGLEE) 100 UNIT/ML injection; Inject 0.12 mLs (12 Units total) into the skin at bedtime.  Dispense: 10 mL; Refill: 2 - insulin lispro (HUMALOG) 100 UNIT/ML injection; Inject 0.01-0.07 mLs (1-7 Units total) into the skin See admin instructions. Per sliding scale 3 times daily with meals 151-200= 1 unit 201-250= 2 units 251-300= 3 units 301-350= 5 units 351-400= 7 units Greater than 400 call md takes only as needed if blood sugar is over 200  Dispense: 10 mL; Refill: 2 - Insulin Pen Needle (PEN NEEDLES) 31G X 8 MM MISC; 1 each by Other route at bedtime. UAD  Dispense: 100 each; Refill: 2  5. Diabetic eye exam [facility]) - Referral to Ophthalmology for evaluation/management.  - Ambulatory referral to Ophthalmology  6. Primary hypertension 7. Hypertrophic cardiomyopathy (HCC) - Keep all scheduled appointments with Cardiology.   8. ESRD (end stage renal disease) on dialysis [facility]) - Keep all scheduled appointments with Nephrology and dialysis.   9. Snoring - Sleep study for evaluation/management.  - PSG Sleep Study;  Future  10. Blindness of left eye, unspecified right eye visual impairment category 11. Decreased vision of right eye - Referral to Home Health and Physical Therapy for evaluation/management.  - Ambulatory referral to Home Health - Ambulatory referral to Physical Therapy     Patient was given the opportunity to ask questions.  Patient verbalized understanding of the plan and was able to repeat key elements of the plan. Patient was given clear instructions to go to Emergency Department or return to medical center if symptoms don't improve, worsen, or new problems develop.The patient verbalized understanding.   Orders Placed This Encounter  Procedures   Lipid panel   TSH   Ambulatory referral to Home Health   Ambulatory referral to Physical Therapy   Ambulatory referral to Ophthalmology   PSG Sleep Study     Requested Prescriptions   Signed Prescriptions Disp Refills   insulin glargine-yfgn (SEMGLEE) 100 UNIT/ML injection 10 mL 2    Sig: Inject 0.12 mLs (12 Units total) into the skin at bedtime.   insulin lispro (HUMALOG) 100 UNIT/ML injection 10 mL 2    Sig: Inject 0.01-0.07 mLs (1-7 Units total) into the skin See admin instructions. Per sliding scale 3 times daily with meals 151-200= 1 unit 201-250= 2  units 251-300= 3 units 301-350= 5 units 351-400= 7 units Greater than 400 call md takes only as needed if blood sugar is over 200   Insulin Pen Needle (PEN NEEDLES) 31G X 8 MM MISC 100 each 2    Sig: 1 each by Other route at bedtime. UAD    Return in about 1 year (around 08/14/2024) for Physical per patient preference.  Rema Fendt, NP

## 2023-08-15 NOTE — Progress Notes (Signed)
Patient states eye problems.   Asophigitus States he may have anemia  Multiple complaints to discuss.

## 2023-08-16 ENCOUNTER — Ambulatory Visit: Payer: Medicare Other | Admitting: Endocrinology

## 2023-08-16 ENCOUNTER — Encounter: Payer: Self-pay | Admitting: Family

## 2023-08-16 LAB — LIPID PANEL
Chol/HDL Ratio: 4.7 {ratio} (ref 0.0–5.0)
Cholesterol, Total: 186 mg/dL (ref 100–199)
HDL: 40 mg/dL (ref >39)
LDL Chol Calc (NIH): 118 mg/dL — ABNORMAL HIGH (ref 0–99)
Triglycerides: 159 mg/dL — ABNORMAL HIGH (ref 0–149)
VLDL Cholesterol Cal: 28 mg/dL (ref 5–40)

## 2023-08-16 LAB — TSH: TSH: 1.06 u[IU]/mL (ref 0.450–4.500)

## 2023-08-16 NOTE — Telephone Encounter (Signed)
I tried to reach the patient again : [phone] to inquire if he has a preference for home health agencies and also to confirm that he wants home health, not [facility]. Message left with call back requested.

## 2023-08-17 NOTE — Telephone Encounter (Signed)
 I tried again to reach the patient : [phone] to inquire if he has a preference for home health agencies and also to confirm that he wants home health, not [facility]. Message left with call back requested.

## 2023-08-19 ENCOUNTER — Other Ambulatory Visit (INDEPENDENT_AMBULATORY_CARE_PROVIDER_SITE_OTHER): Payer: Self-pay | Admitting: Gastroenterology

## 2023-08-23 ENCOUNTER — Telehealth: Payer: Self-pay | Admitting: Family

## 2023-08-23 ENCOUNTER — Telehealth (INDEPENDENT_AMBULATORY_CARE_PROVIDER_SITE_OTHER): Payer: Self-pay | Admitting: Gastroenterology

## 2023-08-23 NOTE — Telephone Encounter (Signed)
Pt scheduled EGD for 3/25 but wrong rooms were put on the date so pt needed to be rescheduled. Contacted pt and rescheduled pt to 10/06/23

## 2023-08-23 NOTE — Telephone Encounter (Signed)
Patient was identified as falling into the [place] Measure - Diabetes.   Patient was: Appointment scheduled with primary care provider in the next 30 days.  Appt on 09/07/23

## 2023-08-23 NOTE — Telephone Encounter (Signed)
I called the patient to inform him that I received a referral for home health SN and PT and I wanted to inquire if he has a preference for home health agencies or if he is interested in [facility]. He answered the phone and said it was not a good time to talk and wanted me to call back. I instructed him to call me back when he has time to talk and he said he would.

## 2023-09-07 ENCOUNTER — Encounter: Payer: Self-pay | Admitting: Family

## 2023-09-07 ENCOUNTER — Telehealth: Payer: Self-pay | Admitting: Family

## 2023-09-07 ENCOUNTER — Ambulatory Visit (INDEPENDENT_AMBULATORY_CARE_PROVIDER_SITE_OTHER): Payer: 59 | Admitting: Family

## 2023-09-07 VITALS — BP 137/89 | HR 85 | Temp 98.6°F | Ht 72.0 in | Wt 192.8 lb

## 2023-09-07 DIAGNOSIS — Z794 Long term (current) use of insulin: Secondary | ICD-10-CM

## 2023-09-07 DIAGNOSIS — Z742 Need for assistance at home and no other household member able to render care: Secondary | ICD-10-CM

## 2023-09-07 DIAGNOSIS — E1165 Type 2 diabetes mellitus with hyperglycemia: Secondary | ICD-10-CM | POA: Diagnosis not present

## 2023-09-07 DIAGNOSIS — Z0289 Encounter for other administrative examinations: Secondary | ICD-10-CM | POA: Diagnosis not present

## 2023-09-07 DIAGNOSIS — H548 Legal blindness, as defined in USA: Secondary | ICD-10-CM

## 2023-09-07 DIAGNOSIS — E119 Type 2 diabetes mellitus without complications: Secondary | ICD-10-CM

## 2023-09-07 NOTE — Progress Notes (Signed)
 Patient wants to talk about handicap sticker and home health agency.  Referral to eye dr.

## 2023-09-07 NOTE — Progress Notes (Signed)
 Patient ID: [name], male    DOB: January 09, 1986  MRN: [id number]  CC: Chronic Conditions Follow-Up  Subjective: [name] is a 38 y.o. male who presents for chronic conditions follow-up.   His concerns today include:  - Reports upcoming appointment with Endocrinology.  - Referral to Ophthalmology for routine exam. Reports legally blind for 1.5 years.  - Referral to home health to assist with activities of daily living.  - Requests handicap placard for his cousin to use when he takes him to run errands.   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) 12/29/2021   Status post surgery 12/29/2021   Hypoglycemia 10/30/2021   Acute hypoxic respiratory failure (HCC) 03/12/2021   ESRD (end stage renal disease) on dialysis (HCC) 03/12/2021   Hypertrophic cardiomyopathy (HCC) 02/26/2021   Syncope 02/08/2021   DKA (diabetic ketoacidosis) (HCC) 02/08/2021   Hyperglycemia    Diabetic foot ulcer (HCC) 11/24/2020   Hypervolemia associated with renal insufficiency 08/07/2020   Uremia 08/07/2020   Metabolic acidosis 08/07/2020   Nausea and vomiting 05/07/2020   Dehydration    Hypertensive urgency 02/25/2020   Anemia 12/02/2019   Essential hypertension 12/02/2019   SOB (shortness of breath) 12/02/2019   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) 12/02/2019   Diarrhea 03/23/2017   Early satiety 03/23/2017   Generalized abdominal pain 03/23/2017     Current Outpatient Medications on File Prior to Visit  Medication Sig Dispense Refill    acetaminophen (TYLENOL) 325 MG tablet Take 2 tablets (650 mg total) by mouth every 6 (six) hours as needed for mild pain (or Fever >/= 101).     atorvastatin (LIPITOR) 40 MG tablet Take 1 tablet (40 mg total) by mouth daily. 30 tablet 2   calcitRIOL (ROCALTROL) 0.5 MCG capsule Take 2 capsules (1 mcg total) by mouth Every Tuesday,Thursday,and Saturday with dialysis. 30 capsule 1   carvedilol (COREG) 25 MG tablet Take 1 tablet (25 mg total) by mouth 2 (two) times daily with a meal. 60 tablet 2   cloNIDine (CATAPRES) 0.1 MG tablet Take 1 tablet (0.1 mg total) by mouth 2 (two) times daily. 60 tablet 2   diphenhydramine-acetaminophen (TYLENOL PM) 25-500 MG TABS tablet Take 2 tablets by mouth 2 (two) times daily as needed (pain, sleep).     insulin glargine-yfgn (SEMGLEE) 100 UNIT/ML injection Inject 0.12 mLs (12 Units total) into the skin at bedtime. 10 mL 2   insulin lispro (HUMALOG) 100 UNIT/ML injection Inject 0.01-0.07 mLs (1-7 Units total) into the skin See admin instructions. Per sliding scale 3 times daily with meals 151-200= 1 unit 201-250= 2 units 251-300= 3 units 301-350= 5 units 351-400= 7 units Greater than 400 call md takes only as needed if blood sugar is over 200 10 mL 2   Insulin Pen Needle (PEN NEEDLES) 31G X 8 MM MISC 1 each by Other route at bedtime. UAD 100 each 2   metoCLOPramide (REGLAN) 10 MG tablet Take 1 tablet (10 mg total) by mouth 4 (four) times daily -  before meals and at bedtime. 120 tablet 1  Multiple Vitamins-Minerals (MULTIVITAMIN WITH MINERALS) tablet Take 1 tablet by mouth daily.     pantoprazole (PROTONIX) 40 MG tablet Take 1 tablet (40 mg total) by mouth 2 (two) times daily. 60 tablet 2   sodium zirconium cyclosilicate (LOKELMA) 10 g PACK packet Take 10 g by mouth See admin instructions. Take 1 packet (10g) once daily on non-dialysis days (Sunday, Monday, Wednesday, Friday)     No current facility-administered medications on file prior to visit.    Allergies   Allergen Reactions   Apresoline [Hydralazine] Nausea And Vomiting and Other (See Comments)    Patient was taking Hydralazine AND Amlodipine at the same time, so the reactions came from one of the 2: Lethargy and an all-over feeling of NOT feeling well   Norvasc [Amlodipine] Nausea And Vomiting and Other (See Comments)    Patient was taking Amlodipine AND Hydralazine at the same time, so the reactions came from one of the 2: Lethargy and an all-over feeling of NOT feeling well     Social History   Socioeconomic History   Marital status: Single    Spouse name: Not on file   Number of children: Not on file   Years of education: Not on file   Highest education level: Not on file  Occupational History   Occupation: disabled  Tobacco Use   Smoking status: Never    Passive exposure: Never   Smokeless tobacco: Never  Vaping Use   Vaping status: Never Used  Substance and Sexual Activity   Alcohol use: Not Currently    Comment: social drinker   Drug use: No   Sexual activity: Yes  Other Topics Concern   Not on file  Social History Narrative   Not on file   Social Drivers of Health   Financial Resource Strain: Not on file  Food Insecurity: No Food Insecurity (08/02/2023)   Hunger Vital Sign    Worried About Running Out of Food in the Last Year: Never true    Ran Out of Food in the Last Year: Never true  Transportation Needs: No Transportation Needs (08/02/2023)   PRAPARE - Administrator, Civil Service (Medical): No    Lack of Transportation (Non-Medical): No  Physical Activity: Not on file  Stress: Not on file  Social Connections: Unknown (11/11/2021)   Received from [facility]   Social Network    Social Network: Not on file  Intimate Partner Violence: Not At Risk (08/02/2023)   Humiliation, Afraid, Rape, and Kick questionnaire    Fear of Current or Ex-Partner: No    Emotionally Abused: No    Physically Abused: No    Sexually Abused: No     Family History  Problem Relation Age of Onset   Cancer Mother    Stroke Father    Diabetes Father    Hypertension Father     Past Surgical History:  Procedure Laterality Date   AMPUTATION TOE Right 12/29/2021   Procedure: AMPUTATION TOE, fifth;  Surgeon: [name] Sjogren, DPM;  Location: [facility] OR;  Service: Podiatry;  Laterality: Right;  surgical team will do local block   AV FISTULA PLACEMENT Left 08/11/2020   Procedure: LEFT UPPER EXTREMITY ARTERIOVENOUS (AV) FISTULA CREATION;  Surgeon: Leonie Douglas, MD;  Location: [facility] OR;  Service: Vascular;  Laterality: Left;   BIOPSY  08/04/2023   Procedure: BIOPSY;  Surgeon: [name], MD;  [facility];  Service: Endoscopy;;   ESOPHAGOGASTRODUODENOSCOPY (EGD) WITH PROPOFOL N/A 08/04/2023  Procedure: ESOPHAGOGASTRODUODENOSCOPY (EGD) WITH PROPOFOL;  Surgeon: [name], MD;  [facility];  Service: Endoscopy;  Laterality: N/A;   FISTULA SUPERFICIALIZATION Left 12/02/2021   Procedure: PLICATION OF LEFT RADIUS CEPHALIC FISTULA;  Surgeon: Leonie Douglas, MD;  Location: [facility] OR;  Service: Vascular;  Laterality: Left;  PERIPHERAL NERVE BLOCK   FRACTURE SURGERY     I & D EXTREMITY Left 07/16/2014   Procedure: IRRIGATION AND DEBRIDEMENT EXTREMITY/LEFT INDEX FINGER;  Surgeon: Betha Loa, MD;  Location: [facility] OR;  Service: Orthopedics;  Laterality: Left;   IR FLUORO GUIDE CV LINE RIGHT  10/04/2022   IR FLUORO GUIDE CV LINE RIGHT  10/05/2022   IR PERC TUN PERIT CATH WO PORT S&I /IMAG  08/07/2020   IR REMOVAL TUN CV CATH W/O FL  10/27/2022   IR THROMBECTOMY AV FISTULA W/THROMBOLYSIS/PTA INC/SHUNT/IMG LEFT Left 10/05/2022   IR US GUIDE VASC ACCESS RIGHT  08/07/2020   IR US GUIDE VASC ACCESS RIGHT  10/04/2022   IR US GUIDE VASC ACCESS RIGHT  10/05/2022   TEE WITHOUT CARDIOVERSION N/A 10/28/2022   Procedure: TRANSESOPHAGEAL ECHOCARDIOGRAM;  Surgeon: Thomasene Ripple, DO;  [facility]: [facility] INVASIVE CV LAB;  Service: Cardiovascular;  Laterality:  N/A;    ROS: Review of Systems Negative except as stated above  PHYSICAL EXAM: BP 137/89   Pulse 85   Temp 98.6 F (37 C) (Oral)   Ht 6' (1.829 m)   Wt 192 lb 12.8 oz (87.5 kg)   SpO2 97%   BMI 26.15 kg/m   Physical Exam HENT:     Head: Normocephalic and atraumatic.     Nose: Nose normal.     Mouth/Throat:     Mouth: Mucous membranes are moist.     Pharynx: Oropharynx is clear.  Eyes:     Extraocular Movements: Extraocular movements intact.     Conjunctiva/sclera: Conjunctivae normal.     Pupils: Pupils are equal, round, and reactive to light.  Cardiovascular:     Rate and Rhythm: Normal rate and regular rhythm.     Pulses: Normal pulses.     Heart sounds: Normal heart sounds.  Pulmonary:     Effort: Pulmonary effort is normal.     Breath sounds: Normal breath sounds.  Musculoskeletal:        General: Normal range of motion.     Cervical back: Normal range of motion and neck supple.  Neurological:     General: No focal deficit present.     Mental Status: He is alert and oriented to person, place, and time.  Psychiatric:        Mood and Affect: Mood normal.        Behavior: Behavior normal.     ASSESSMENT AND PLAN: 1. Type 2 diabetes mellitus with hyperglycemia, with long-term current use of insulin (HCC) (Primary) - Hemoglobin A1c 6.0% on 08/01/2023.  - Continue Insulin Glargine-YFGN and Insulin Lispro as prescribed. No refills needed as of present. - Discussed the importance of healthy eating habits, low-carbohydrate diet, low-sugar diet, regular aerobic exercise (at least 150 minutes a week as tolerated) and medication compliance to achieve or maintain control of diabetes. - Keep all scheduled appointments with Endocrinology.  2. Diabetic eye exam (HCC) 3. Legally blind - Referral to Ophthalmology for evaluation/management. - Ambulatory referral to Ophthalmology  4. Encounter for completion of form with patient - Handicap placard completed (see above #  3).  5. Need for home health care - Message sent to RN case manager  for assistance.    Patient was given the opportunity to ask questions.  Patient verbalized understanding of the plan and was able to repeat key elements of the plan. Patient was given clear instructions to go to Emergency Department or return to medical center if symptoms don't improve, worsen, or new problems develop.The patient verbalized understanding.   Orders Placed This Encounter  Procedures   Ambulatory referral to Ophthalmology   Follow-up with primary provider as scheduled.  Rema Fendt, NP

## 2023-09-08 NOTE — Telephone Encounter (Signed)
 I spoke to the patient and discussed the request for PCS.  I explained that the focus of the service is to assist with personal care and ADLs, not housekeeping and he agreed that he needs the assistance with personal care. I told him that I will submit the referral to Medicaid and they will contact him and have a nurse come out to evaluate him and if he is approved for services, they will let him know how many hours/ day and days/week he qualifies for. He said he understood.  I also explained to him that I had contacted him earlier this month with a referral for home nursing and PT and when I spoke to him he said he would call me back but he never did. At this time, he said that his needs are for [facility], not the nursing and PT.  Amy- I will send you the PCS form to sign.

## 2023-09-09 NOTE — Telephone Encounter (Signed)
 Noted.

## 2023-09-12 NOTE — Telephone Encounter (Signed)
 Completed PCS request e-faxed to [place] [employer]

## 2023-09-30 MED ORDER — SODIUM CHLORIDE 0.9 % IV SOLN: INTRAVENOUS | Status: AC

## 2023-10-04 ENCOUNTER — Encounter (HOSPITAL_COMMUNITY)
Admission: RE | Admit: 2023-10-04 | Discharge: 2023-10-04 | Disposition: A | Discharge status: Home / Self Care | Payer: 59 | Source: Ambulatory Visit | Attending: Gastroenterology | Admitting: Gastroenterology

## 2023-10-04 DIAGNOSIS — Z992 Dependence on renal dialysis: Secondary | ICD-10-CM

## 2023-10-04 NOTE — Pre-Procedure Instructions (Signed)
 Attempted pre-op phone call. Patient states he needs to reschedule his procedure. Office messaged to get him rescheduled.

## 2023-10-06 ENCOUNTER — Ambulatory Visit (HOSPITAL_COMMUNITY): Admission: RE | Admit: 2023-10-06 | Payer: 59 | Source: Home / Self Care | Admitting: Gastroenterology

## 2023-10-06 ENCOUNTER — Ambulatory Visit (HOSPITAL_BASED_OUTPATIENT_CLINIC_OR_DEPARTMENT_OTHER): Payer: 59 | Admitting: Internal Medicine

## 2023-10-06 ENCOUNTER — Encounter (HOSPITAL_COMMUNITY): Admission: RE | Payer: Self-pay | Source: Home / Self Care

## 2023-10-06 SURGERY — ESOPHAGOGASTRODUODENOSCOPY (EGD) WITH PROPOFOL
Anesthesia: Monitor Anesthesia Care

## 2023-10-12 ENCOUNTER — Encounter (HOSPITAL_BASED_OUTPATIENT_CLINIC_OR_DEPARTMENT_OTHER): Admitting: Internal Medicine

## 2023-10-26 ENCOUNTER — Ambulatory Visit: Admitting: Gastroenterology

## 2023-10-26 ENCOUNTER — Encounter: Payer: Self-pay | Admitting: Gastroenterology

## 2023-10-26 VITALS — BP 119/76 | HR 88 | Temp 97.1°F | Ht 72.0 in | Wt 199.1 lb

## 2023-10-26 DIAGNOSIS — R066 Hiccough: Secondary | ICD-10-CM | POA: Diagnosis not present

## 2023-10-26 DIAGNOSIS — K209 Esophagitis, unspecified without bleeding: Secondary | ICD-10-CM

## 2023-10-26 DIAGNOSIS — R1013 Epigastric pain: Secondary | ICD-10-CM

## 2023-10-26 DIAGNOSIS — R110 Nausea: Secondary | ICD-10-CM

## 2023-10-26 MED ORDER — SUCRALFATE 1 GM/10ML PO SUSP
1.0000 g | Freq: Four times a day (QID) | ORAL | 1 refills | Status: DC
Start: 2023-10-26 — End: 2024-02-13

## 2023-10-26 NOTE — Patient Instructions (Signed)
 We are arranging an upper endoscopy with Dr. Alita Irwin in the near future. You will need to take only 1/2 dose of insulin the night before the procedure. No insulin that morning of the procedure.  Definitely continue the pantoprazole twice a day, 30 minutes before breakfast and dinner to help heal the esophagus and stomach.  I have also sent in a liquid suspension called carafate to take four times a day before eating and at bedtime. If this is too pricey, let me know, as we can change to tablets.   Happy early birthday! It was a delight to meet you!  It was a pleasure to see you today. I want to create trusting relationships with patients and provide genuine, compassionate, and quality care. I truly value your feedback, so please be on the lookout for a survey regarding your visit with me today. I appreciate your time in completing this!         Delman Ferns, PhD, ANP-BC [facility]

## 2023-10-26 NOTE — Progress Notes (Signed)
 Gastroenterology Office Note     Primary Care Physician:  Rema Fendt, NP  Primary Gastroenterologist: Dr Tasia Catchings   Chief Complaint   Chief Complaint  Patient presents with   Follow-up    Patient here today for a follow up from Egd preformed by Dr. Tasia Catchings on 08/04/2023, at which time patient was found to have Acute gastritis. He is currently on pantoprazole 40 mg bid.Patient says he has some issues at times with burning in stomach, dysphagia, he also has hiccups at times.      History of Present Illness   [name] is a 38 y.o. male presenting today with a history of  ADHD, diabetes, ESRD on HD (TTHS), HLD, HTN, hypertrophic cardiomyopathy, and seizures who presented to the ED on 08/02/23 with complaints of abdominal pain as well as associated N/V.  GI consulted given concerns for coffee-ground emesis by hospital staff. He underwent EGD while inpatient with Grade D esophagitis, non-bleeding MW tear, acute gastritis s/p biopsy, duodenal polyp not resected. He is presenting to arrange repeat EGD at this time to reassess healing of severe esophagitis, assess for Barrett's, and remove duodenal polyp.   He denies any vomiting. Feels burning in abdomen postprandially at times. Hiccups intermittently. Chronic nausea. Early satiety. Eats breakfast but won't eat till hungry later in the day. Occasional solid food dysphagia. He was on Reglan short-term in the hospital but no longer.   Trying to take pantoprazole BID but due to visual impairment has had issues with taking all of his meds correctly. Over past few weeks he has had to get assistance when at dialysis to arrange medications.   Following renal transplant.    EGD while inpatient Jan 2025 with Grade D esophagitis, non-bleeding MW tear, acute gastritis s/p biopsy, duodenal polyp not resected,. Path negative H.pylori, no metaplasia or dysplasia.   Past Medical History:  Diagnosis Date   ADHD (attention deficit hyperactivity  disorder)    Diabetes mellitus    ESRD on hemodialysis (HCC)    [facility] [place] T[facility]   High cholesterol    Hypertension    Hypertrophic cardiomyopathy (HCC) 02/26/2021   Seizure (HCC) 02/08/2021    Past Surgical History:  Procedure Laterality Date   AMPUTATION TOE Right 12/29/2021   Procedure: AMPUTATION TOE, fifth;  Surgeon: Louann Sjogren, DPM;  Location: [facility] OR;  Service: Podiatry;  Laterality: Right;  surgical team will do local block   AV FISTULA PLACEMENT Left 08/11/2020   Procedure: LEFT UPPER EXTREMITY ARTERIOVENOUS (AV) FISTULA CREATION;  Surgeon: Leonie Douglas, MD;  Location: [facility] OR;  Service: Vascular;  Laterality: Left;   BIOPSY  08/04/2023   Procedure: BIOPSY;  Surgeon: [name], MD;  [facility];  Service: Endoscopy;;   ESOPHAGOGASTRODUODENOSCOPY (EGD) WITH PROPOFOL N/A 08/04/2023   Procedure: ESOPHAGOGASTRODUODENOSCOPY (EGD) WITH PROPOFOL;  Surgeon: [name], MD;  [facility];  Service: Endoscopy;  Laterality: N/A;   FISTULA SUPERFICIALIZATION Left 12/02/2021   Procedure: PLICATION OF LEFT RADIUS CEPHALIC FISTULA;  Surgeon: Leonie Douglas, MD;  Location: [facility] OR;  Service: Vascular;  Laterality: Left;  PERIPHERAL NERVE BLOCK   FRACTURE SURGERY     I & D EXTREMITY Left 07/16/2014   Procedure: IRRIGATION AND DEBRIDEMENT EXTREMITY/LEFT INDEX FINGER;  Surgeon: Betha Loa, MD;  Location: [facility] OR;  Service: Orthopedics;  Laterality: Left;   IR FLUORO GUIDE CV LINE RIGHT  10/04/2022   IR FLUORO GUIDE CV LINE RIGHT  10/05/2022   IR  PERC TUN PERIT CATH WO PORT S&I /IMAG  08/07/2020   IR REMOVAL TUN CV CATH W/O FL  10/27/2022   IR THROMBECTOMY AV FISTULA W/THROMBOLYSIS/PTA INC/SHUNT/IMG LEFT Left 10/05/2022   IR US GUIDE VASC ACCESS RIGHT  08/07/2020   IR US GUIDE VASC ACCESS RIGHT  10/04/2022   IR US GUIDE VASC ACCESS RIGHT  10/05/2022   TEE WITHOUT CARDIOVERSION N/A 10/28/2022   Procedure: TRANSESOPHAGEAL ECHOCARDIOGRAM;  Surgeon: Thomasene Ripple,  DO;  [facility]: [facility] INVAS[facility] CV LAB;  Service: Cardiovascular;  Laterality: N/A;    Current Outpatient Medications  Medication Sig Dispense Refill   acetaminophen (TYLENOL) 325 MG tablet Take 2 tablets (650 mg total) by mouth every 6 (six) hours as needed for mild pain (or Fever >/= 101).     atorvastatin (LIPITOR) 40 MG tablet Take 1 tablet (40 mg total) by mouth daily. 30 tablet 2   calcitRIOL (ROCALTROL) 0.5 MCG capsule Take 2 capsules (1 mcg total) by mouth Every Tuesday,Thursday,and Saturday with dialysis. 30 capsule 1   carvedilol (COREG) 25 MG tablet Take 1 tablet (25 mg total) by mouth 2 (two) times daily with a meal. 60 tablet 2   cloNIDine (CATAPRES) 0.1 MG tablet Take 1 tablet (0.1 mg total) by mouth 2 (two) times daily. 60 tablet 2   diphenhydramine-acetaminophen (TYLENOL PM) 25-500 MG TABS tablet Take 2 tablets by mouth 2 (two) times daily as needed (pain, sleep).     insulin glargine-yfgn (SEMGLEE) 100 UNIT/ML injection Inject 0.12 mLs (12 Units total) into the skin at bedtime. 10 mL 2   insulin lispro (HUMALOG) 100 UNIT/ML injection Inject 0.01-0.07 mLs (1-7 Units total) into the skin See admin instructions. Per sliding scale 3 times daily with meals 151-200= 1 unit 201-250= 2 units 251-300= 3 units 301-350= 5 units 351-400= 7 units Greater than 400 call md takes only as needed if blood sugar is over 200 10 mL 2   Insulin Pen Needle (PEN NEEDLES) 31G X 8 MM MISC 1 each by Other route at bedtime. UAD 100 each 2   Multiple Vitamins-Minerals (MULTIVITAMIN WITH MINERALS) tablet Take 1 tablet by mouth daily.     pantoprazole (PROTONIX) 40 MG tablet Take 1 tablet (40 mg total) by mouth 2 (two) times daily. 60 tablet 2   sodium zirconium cyclosilicate (LOKELMA) 10 g PACK packet Take 10 g by mouth See admin instructions. Take 1 packet (10g) once daily on non-dialysis days (Sunday, Monday, Wednesday, Friday)     metoCLOPramide (REGLAN) 10 MG tablet Take 1 tablet (10 mg total) by mouth 4 (four)  times daily -  before meals and at bedtime. 120 tablet 1   No current facility-administered medications for this visit.    Allergies as of 10/26/2023 - Review Complete 10/26/2023  Allergen Reaction Noted   Apresoline [hydralazine] Nausea And Vomiting and Other (See Comments) 03/10/2020   Norvasc [amlodipine] Nausea And Vomiting and Other (See Comments) 03/10/2020    Family History  Problem Relation Age of Onset   Cancer Mother    Stroke Father    Diabetes Father    Hypertension Father     Social History   Socioeconomic History   Marital status: Single    Spouse name: Not on file   Number of children: Not on file   Years of education: Not on file   Highest education level: Not on file  Occupational History   Occupation: disabled  Tobacco Use   Smoking status: Never    Passive exposure: Never  Smokeless tobacco: Never  Vaping Use   Vaping status: Never Used  Substance and Sexual Activity   Alcohol use: Not Currently    Comment: social drinker   Drug use: No   Sexual activity: Yes  Other Topics Concern   Not on file  Social History Narrative   Not on file   Social Drivers of Health   Financial Resource Strain: Not on file  Food Insecurity: No Food Insecurity (08/02/2023)   Hunger Vital Sign    Worried About Running Out of Food in the Last Year: Never true    Ran Out of Food in the Last Year: Never true  Transportation Needs: No Transportation Needs (08/02/2023)   PRAPARE - Administrator, Civil Service (Medical): No    Lack of Transportation (Non-Medical): No  Physical Activity: Not on file  Stress: Not on file  Social Connections: Unknown (11/11/2021)   Received [facility]   Social Network    Social Network: Not on file  Intimate Partner Violence: Not At Risk (08/02/2023)   Humiliation, Afraid, Rape, and Kick questionnaire    Fear of Current or Ex-Partner: No    Emotionally Abused: No    Physically Abused: No    Sexually  Abused: No     Review of Systems   Gen: Denies any fever, chills, fatigue, weight loss, lack of appetite.  CV: Denies chest pain, heart palpitations, peripheral edema, syncope.  Resp: Denies shortness of breath at rest or with exertion. Denies wheezing or cough.  GI: Denies dysphagia or odynophagia. Denies jaundice, hematemesis, fecal incontinence. GU : Denies urinary burning, urinary frequency, urinary hesitancy MS: Denies joint pain, muscle weakness, cramps, or limitation of movement.  Derm: Denies rash, itching, dry skin Psych: Denies depression, anxiety, memory loss, and confusion Heme: Denies bruising, bleeding, and enlarged lymph nodes.   Physical Exam   BP 119/76 (BP Location: Right Arm, Patient Position: Sitting, Cuff Size: Normal)   Pulse 88   Temp (!) 97.1 F (36.2 C) (Temporal)   Ht 6' (1.829 m)   Wt 199 lb 1.6 oz (90.3 kg)   BMI 27.00 kg/m  General:   Alert and oriented. Pleasant and cooperative. Well-nourished and well-developed.  Head:  Normocephalic and atraumatic. Eyes:  legally blind Abdomen:  +BS, soft, non-tender and non-distended. No HSM noted. No guarding or rebound. No masses appreciated.  Rectal:  Deferred  Msk:  Symmetrical without gross deformities. Normal posture. Extremities:  Without edema. Neurologic:  Alert and  oriented x4;  grossly normal neurologically. Skin:  Intact without significant lesions or rashes. Psych:  Alert and cooperative. Normal mood and affect.   Assessment   [name] is a 38 y.o. male presenting today with a history of ADHD, diabetes, ESRD on HD (TTHS), HLD, HTN, hypertrophic cardiomyopathy, and seizures who presents for hospital follow-up from Jan 2025 when seen for hematemesis.  During hospitalization, EGD Jan 2025 revealed Grade D esophagitis, non-bleeding MW tear, acute gastritis, and duodenal bulb not resected. He was discharged on PPI BID therapy and needs surveillance EGD to reassess and also remove duodenal  polyp.  Noting intermittent hiccups and dyspepsia, underlying nausea; however, he has been off BID PPI for a short span of time as medications had to be rebundled (he is vision impaired). I suspect also may have underlying gastroparesis in setting of diabetes. As no vomiting, recommend continuing PPI BID, small frequent meals, and can consider motility agent if unable to tolerate diet.  PLAN    PPI BID Add Carafate QID Proceed with upper endoscopy by Dr. Alita Irwin in near future: the risks, benefits, and alternatives have been discussed with the patient in detail. The patient states understanding and desires to proceed.  Further recommendations to follow     Delman Ferns, PhD, ANP-BC [facility]

## 2023-10-27 ENCOUNTER — Encounter (INDEPENDENT_AMBULATORY_CARE_PROVIDER_SITE_OTHER): Payer: Self-pay

## 2023-10-27 ENCOUNTER — Other Ambulatory Visit: Payer: Self-pay | Admitting: Gastroenterology

## 2023-11-07 ENCOUNTER — Telehealth: Payer: Self-pay | Admitting: Family

## 2023-11-07 ENCOUNTER — Encounter: Payer: Self-pay | Admitting: Family

## 2023-11-07 ENCOUNTER — Ambulatory Visit (INDEPENDENT_AMBULATORY_CARE_PROVIDER_SITE_OTHER): Payer: 59 | Admitting: Family

## 2023-11-07 VITALS — BP 168/120 | HR 85 | Temp 97.9°F | Resp 18 | Ht 72.0 in | Wt 200.2 lb

## 2023-11-07 DIAGNOSIS — N186 End stage renal disease: Secondary | ICD-10-CM | POA: Diagnosis not present

## 2023-11-07 DIAGNOSIS — Z7985 Long-term (current) use of injectable non-insulin antidiabetic drugs: Secondary | ICD-10-CM

## 2023-11-07 DIAGNOSIS — I10 Essential (primary) hypertension: Secondary | ICD-10-CM | POA: Diagnosis not present

## 2023-11-07 DIAGNOSIS — Z794 Long term (current) use of insulin: Secondary | ICD-10-CM | POA: Diagnosis not present

## 2023-11-07 DIAGNOSIS — R61 Generalized hyperhidrosis: Secondary | ICD-10-CM

## 2023-11-07 DIAGNOSIS — I422 Other hypertrophic cardiomyopathy: Secondary | ICD-10-CM

## 2023-11-07 DIAGNOSIS — D170 Benign lipomatous neoplasm of skin and subcutaneous tissue of head, face and neck: Secondary | ICD-10-CM

## 2023-11-07 DIAGNOSIS — E1122 Type 2 diabetes mellitus with diabetic chronic kidney disease: Secondary | ICD-10-CM

## 2023-11-07 DIAGNOSIS — N189 Chronic kidney disease, unspecified: Secondary | ICD-10-CM

## 2023-11-07 DIAGNOSIS — E119 Type 2 diabetes mellitus without complications: Secondary | ICD-10-CM

## 2023-11-07 DIAGNOSIS — M792 Neuralgia and neuritis, unspecified: Secondary | ICD-10-CM

## 2023-11-07 DIAGNOSIS — R059 Cough, unspecified: Secondary | ICD-10-CM

## 2023-11-07 DIAGNOSIS — M25552 Pain in left hip: Secondary | ICD-10-CM

## 2023-11-07 DIAGNOSIS — Z742 Need for assistance at home and no other household member able to render care: Secondary | ICD-10-CM

## 2023-11-07 DIAGNOSIS — Z0189 Encounter for other specified special examinations: Secondary | ICD-10-CM

## 2023-11-07 MED ORDER — INSULIN LISPRO 100 UNIT/ML IJ SOLN: 1.0000 [IU] | INTRAMUSCULAR | 2 refills | Status: DC

## 2023-11-07 MED ORDER — PEN NEEDLES 31G X 8 MM MISC: 1.0000 | Freq: Every day | 5 refills | Status: DC

## 2023-11-07 MED ORDER — BENZONATATE 100 MG PO CAPS: 100.0000 mg | ORAL_CAPSULE | Freq: Three times a day (TID) | ORAL | 2 refills | Status: DC | PRN

## 2023-11-07 MED ORDER — INSULIN GLARGINE-YFGN 100 UNIT/ML ~~LOC~~ SOLN: 12.0000 [IU] | Freq: Every day | SUBCUTANEOUS | 2 refills | Status: AC

## 2023-11-07 NOTE — Progress Notes (Signed)
 3 month follow, know in the back of head,  what's a referral patients sweats a lot and have chills, wants to know the process of where he is at with getting a home with aide, patient has a cough

## 2023-11-07 NOTE — Progress Notes (Signed)
 Patient ID: [name], male    DOB: 1985-10-12  MRN: [id number]  CC: Chronic Conditions Follow-Up  Subjective: [name] is a 38 y.o. male who presents for chronic conditions follow-up.  His concerns today include:  - Doing well on Insulin  Glargine-YFGN and Insulin  Lispro, no issues/concerns. Home blood sugars low 100's. Denies red flag symptoms associated with diabetes.  - States he forgot to take his blood pressure medications on this morning. States he is due to follow-up with Cardiology. He does not complain of red flag symptoms such as but not limited to chest pain, shortness of breath, worst headache of life, nausea/vomiting.  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 States he receives dialysis three times weekly.  - Chronic left hip pain. Denies recent trauma/injury and red flag symptoms. States he thinks it is related to "nerve endings". He requests referral to Physical Therapy. - States knot at lower neck present for months and becoming larger. Denies red flag symptoms.  He would like referral to specialist to have removed.  - Reports excessive sweating especially during nighttime.  - Cough. Denies red flag symptoms. - States someone came to his home to evaluate the need for a home health aide. States they told him they would follow-up with him in 2 weeks and he never heard back from them.  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) 12/29/2021   Status post surgery 12/29/2021   Hypoglycemia 10/30/2021   Acute hypoxic  respiratory failure (HCC) 03/12/2021   ESRD (end stage renal disease) on dialysis (HCC) 03/12/2021   Hypertrophic cardiomyopathy (HCC) 02/26/2021   Syncope 02/08/2021   DKA (diabetic ketoacidosis) (HCC) 02/08/2021   Hyperglycemia    Diabetic foot ulcer (HCC) 11/24/2020   Hypervolemia associated with renal insufficiency 08/07/2020   Uremia 08/07/2020   Metabolic acidosis 08/07/2020   Nausea and vomiting 05/07/2020   Dehydration    Hypertensive urgency 02/25/2020   Anemia 12/02/2019   Essential hypertension 12/02/2019   SOB (shortness of breath) 12/02/2019   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     calcitRIOL  (ROCALTROL ) 0.5 MCG capsule Take 2 capsules (1 mcg total) by mouth Every Tuesday,Thursday,and Saturday with dialysis. 30 capsule 1   cloNIDine  (CATAPRES ) 0.1 MG tablet Take 1 tablet (0.1 mg total) by mouth 2 (two) times daily. 60 tablet 2   diphenhydramine -acetaminophen  (TYLENOL  PM) 25-500 MG TABS tablet Take 2 tablets by mouth 2 (two) times daily as needed (pain, sleep).     Multiple Vitamins-Minerals (MULTIVITAMIN WITH MINERALS) tablet Take 1 tablet by mouth daily.     pantoprazole  (PROTONIX ) 40 MG tablet TAKE 1 TABLET (40 MG TOTAL) BY MOUTH 2 (TWO) TIMES DAILY. 60 tablet 11  sodium zirconium cyclosilicate  (LOKELMA ) 10 g PACK packet Take 10 g by mouth See admin instructions. Take 1 packet (10g) once daily on non-dialysis days (Sunday, Monday, Wednesday, Friday)     sucralfate  (CARAFATE ) 1 GM/10ML suspension Take 10 mLs (1 g total) by mouth 4 (four) times daily. 420 mL 1   atorvastatin  (LIPITOR) 40 MG tablet Take 1 tablet  (40 mg total) by mouth daily. 30 tablet 2   carvedilol  (COREG ) 25 MG tablet Take 1 tablet (25 mg total) by mouth 2 (two) times daily with a meal. 60 tablet 2   No current facility-administered medications on file prior to visit.    Allergies  Allergen Reactions   Apresoline  [Hydralazine ] Nausea And Vomiting and Other (See Comments)    Patient was taking Hydralazine  AND Amlodipine  at the same time, so the reactions came from one of the 2: Lethargy and an all-over feeling of NOT feeling well   Norvasc  [Amlodipine ] Nausea And Vomiting and Other (See Comments)    Patient was taking Amlodipine  AND Hydralazine  at the same time, so the reactions came from one of the 2: Lethargy and an all-over feeling of NOT feeling well     Social History   Socioeconomic History   Marital status: Single    Spouse name: Not on file   Number of children: Not on file   Years of education: Not on file   Highest education level: Not on file  Occupational History   Occupation: disabled  Tobacco Use   Smoking status: Never    Passive exposure: Never   Smokeless tobacco: Never  Vaping Use   Vaping status: Never Used  Substance and Sexual Activity   Alcohol use: Not Currently    Comment: social drinker   Drug use: No   Sexual activity: Yes  Other Topics Concern   Not on file  Social History Narrative   Not on file   Social Drivers of Health   Financial Resource Strain: Low Risk  (11/07/2023)   Overall Financial Resource Strain (CARDIA)    Difficulty of Paying Living Expenses: Not hard at all  Food Insecurity: No Food Insecurity (08/02/2023)   Hunger Vital Sign    Worried About Running Out of Food in the Last Year: Never true    Ran Out of Food in the Last Year: Never true  Transportation Needs: No Transportation Needs (08/02/2023)   PRAPARE - Administrator, Civil Service (Medical): No    Lack of Transportation (Non-Medical): No  Physical Activity: Inactive (11/07/2023)   Exercise Vital  Sign    Days of Exercise per Week: 0 days    Minutes of Exercise per Session: 0 min  Stress: No Stress Concern Present (11/07/2023)   [employer] of Occupational Health - Occupational Stress Questionnaire    Feeling of Stress : Only a little  Social Connections: Socially Isolated (11/07/2023)   Social Connection and Isolation Panel [NHANES]    Frequency of Communication with Friends and Family: More than three times a week    Frequency of Social Gatherings with Friends and Family: More than three times a week    Attends Religious Services: Never    Database administrator or Organizations: No    Attends Banker Meetings: Never    Marital Status: Never married  Intimate Partner Violence: Not At Risk (08/02/2023)   Humiliation, Afraid, Rape, and Kick questionnaire    Fear of Current or Ex-Partner: No    Emotionally Abused: No  Physically Abused: No    Sexually Abused: No    Family History  Problem Relation Age of Onset   Cancer Mother    Stroke Father    Diabetes Father    Hypertension Father     Past Surgical History:  Procedure Laterality Date   AMPUTATION TOE Right 12/29/2021   Procedure: AMPUTATION TOE, fifth;  Surgeon: Jennefer Moats, DPM;  Location: [facility] OR;  Service: Podiatry;  Laterality: Right;  surgical team will do local block   AV FISTULA PLACEMENT Left 08/11/2020   Procedure: LEFT UPPER EXTREMITY ARTERIOVENOUS (AV) FISTULA CREATION;  Surgeon: Carlene Che, MD;  Location: [facility] OR;  Service: Vascular;  Laterality: Left;   BIOPSY  08/04/2023   Procedure: BIOPSY;  Surgeon: Hargis Lias, MD;  Location: [facility] ENDO SUITE;  Service: Endoscopy;;   ESOPHAGOGASTRODUODENOSCOPY (EGD) WITH PROPOFOL  N/A 08/04/2023   Procedure: ESOPHAGOGASTRODUODENOSCOPY (EGD) WITH PROPOFOL ;  Surgeon: Hargis Lias, MD;  Location: [facility] SUITE;  Service: Endoscopy;  Laterality: N/A;   FISTULA SUPERFICIALIZATION Left 12/02/2021   Procedure: PLICATION OF LEFT RADIUS CEPHALIC  FISTULA;  Surgeon: Carlene Che, MD;  Location: [facility] OR;  Service: Vascular;  Laterality: Left;  PERIPHERAL NERVE BLOCK   FRACTURE SURGERY     I & D EXTREMITY Left 07/16/2014   Procedure: IRRIGATION AND DEBRIDEMENT EXTREMITY/LEFT INDEX FINGER;  Surgeon: Brunilda Capra, MD;  Location: [facility] OR;  Service: Orthopedics;  Laterality: Left;   IR FLUORO GUIDE CV LINE RIGHT  10/04/2022   IR FLUORO GUIDE CV LINE RIGHT  10/05/2022   IR PERC TUN PERIT CATH WO PORT S&I /IMAG  08/07/2020   IR REMOVAL TUN CV CATH W/O FL  10/27/2022   IR THROMBECTOMY AV FISTULA W/THROMBOLYSIS/PTA INC/SHUNT/IMG LEFT Left 10/05/2022   IR US  GUIDE VASC ACCESS RIGHT  08/07/2020   IR US  GUIDE VASC ACCESS RIGHT  10/04/2022   IR US  GUIDE VASC ACCESS RIGHT  10/05/2022   TEE WITHOUT CARDIOVERSION N/A 10/28/2022   Procedure: TRANSESOPHAGEAL ECHOCARDIOGRAM;  Surgeon: Jerryl Morin, DO;  Location: [facility] INVASIVE CV LAB;  Service: Cardiovascular;  Laterality: N/A;    ROS: Review of Systems Negative except as stated above  PHYSICAL EXAM: BP (!) 168/120   Pulse 85   Temp 97.9 F (36.6 C) (Oral)   Resp 18   Ht 6' (1.829 m)   Wt 200 lb 3.2 oz (90.8 kg)   SpO2 97%   BMI 27.15 kg/m   Physical Exam HENT:     Head: Normocephalic and atraumatic.     Nose: Nose normal.     Mouth/Throat:     Mouth: Mucous membranes are moist.     Pharynx: Oropharynx is clear.  Eyes:     Extraocular Movements: Extraocular movements intact.     Conjunctiva/sclera: Conjunctivae normal.     Pupils: Pupils are equal, round, and reactive to light.  Neck:     Comments: Soft movable knot lower posterior neck. Cardiovascular:     Rate and Rhythm: Normal rate and regular rhythm.     Pulses: Normal pulses.     Heart sounds: Normal heart sounds.  Pulmonary:     Effort: Pulmonary effort is normal.     Breath sounds: Normal breath sounds.  Musculoskeletal:        General: Normal range of motion.     Cervical back: Normal range of motion and neck supple.  Skin:     General: Skin is warm and dry.  Neurological:     General: No  focal deficit present.     Mental Status: He is alert and oriented to person, place, and time.  Psychiatric:        Mood and Affect: Mood normal.        Behavior: Behavior normal.     ASSESSMENT AND PLAN: 1. 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 - Hemoglobin A1c result pending.  - Discussed the importance of healthy eating habits, low-carbohydrate diet, low-sugar diet, regular aerobic exercise (at least 150 minutes a week as tolerated) and medication compliance to achieve or maintain control of diabetes. Counseled on medication adherence/adverse effects. - Follow-up with primary provider as scheduled. - POCT glycosylated hemoglobin (Hb A1C) - insulin  glargine-yfgn (SEMGLEE ) 100 UNIT/ML injection; Inject 0.12 mLs (12 Units total) into the skin at bedtime.  Dispense: 10 mL; Refill: 2 - insulin  lispro (HUMALOG ) 100 UNIT/ML injection; Inject 0.01-0.07 mLs (1-7 Units total) into the skin See admin instructions. Per sliding scale 3 times daily with meals 151-200= 1 unit 201-250= 2 units 251-300= 3 units 301-350= 5 units 351-400= 7 units Greater than 400 call md takes only as needed if blood sugar is over 200  Dispense: 10 mL; Refill: 2 - Insulin  Pen Needle (PEN NEEDLES) 31G X 8 MM MISC; 1 each by Other route at bedtime. UAD  Dispense: 100 each; Refill: 5  2. Diabetic eye exam [facility]) - Referral to Ophthalmology for evaluation/management. - Ambulatory referral to Ophthalmology  3. Encounter for diabetic foot exam [facility]) - Referral to Podiatry for evaluation/management. - Ambulatory referral to Podiatry  4. Primary hypertension 5. Hypertrophic cardiomyopathy (HCC) - Blood pressure not at goal during today's visit. Patient asymptomatic without chest pressure, chest pain, palpitations, shortness of breath, worst headache of life, and  any additional red flag symptoms. - Continue present management.  - Counseled on blood pressure goal of less than 130/80, low-sodium, DASH diet, medication compliance, and 150 minutes of moderate intensity exercise per week as tolerated. Counseled on medication adherence and adverse effects. - Referral to Cardiology for evaluation/management.  - Follow-up with primary provider as scheduled. - Ambulatory referral to Cardiology  6. Chronic kidney disease, unspecified CKD stage - Keep all scheduled appointments with dialysis.  - Referral to Nephrology for evaluation/management. - Ambulatory referral to Nephrology  7. Neuropathic pain 8. Pain of left hip - Patient declined referral to Orthopedics.  - Referral to Physical Therapy for evaluation/management. - Follow-up with primary provider as scheduled. - Ambulatory referral to Physical Therapy  9. Lipoma of neck - Referral to Dermatology for evaluation/management. - Ambulatory referral to Dermatology  10. Excessive sweating - Referral to Dermatology for evaluation/management. - Ambulatory referral to Dermatology  11. Cough, unspecified type - Patient today in office with no cardiopulmonary/acute distress.  - Benzonatate as prescribed. Counseled on medication adherence/adverse effects. - Routine screening.  - COVID-19, Flu A+B and RSV - benzonatate (TESSALON PERLES) 100 MG capsule; Take 1 capsule (100 mg total) by mouth 3 (three) times daily as needed.  Dispense: 30 capsule; Refill: 2  12. Need for home health care - Message sent to RN case manager for updates.    Patient was given the opportunity to ask questions.  Patient verbalized understanding of the plan and was able to repeat key elements of the plan. Patient was given clear instructions to go to Emergency Department or return to medical center if symptoms don't improve, worsen, or new problems develop.The patient verbalized understanding.  Orders Placed This Encounter   Procedures   COVID-19, Flu A+B and RSV   Ambulatory referral to Cardiology   Ambulatory referral to Dermatology   Ambulatory referral to Nephrology   Ambulatory referral to Physical Therapy   Ambulatory referral to Podiatry   Ambulatory referral to Ophthalmology   POCT glycosylated hemoglobin (Hb A1C)     Requested Prescriptions   Signed Prescriptions Disp Refills   benzonatate (TESSALON PERLES) 100 MG capsule 30 capsule 2    Sig: Take 1 capsule (100 mg total) by mouth 3 (three) times daily as needed.   insulin  glargine-yfgn (SEMGLEE ) 100 UNIT/ML injection 10 mL 2    Sig: Inject 0.12 mLs (12 Units total) into the skin at bedtime.   insulin  lispro (HUMALOG ) 100 UNIT/ML injection 10 mL 2    Sig: Inject 0.01-0.07 mLs (1-7 Units total) into the skin See admin instructions. Per sliding scale 3 times daily with meals 151-200= 1 unit 201-250= 2 units 251-300= 3 units 301-350= 5 units 351-400= 7 units Greater than 400 call md takes only as needed if blood sugar is over 200   Insulin  Pen Needle (PEN NEEDLES) 31G X 8 MM MISC 100 each 5    Sig: 1 each by Other route at bedtime. UAD    Follow-up with primary provider as scheduled.  Senaida Dama, NP

## 2023-11-08 ENCOUNTER — Encounter (HOSPITAL_COMMUNITY)
Admission: RE | Admit: 2023-11-08 | Discharge: 2023-11-08 | Disposition: A | Discharge status: Home / Self Care | Source: Ambulatory Visit | Attending: Gastroenterology | Admitting: Gastroenterology

## 2023-11-08 DIAGNOSIS — E0800 Diabetes mellitus due to underlying condition with hyperosmolarity without nonketotic hyperglycemic-hyperosmolar coma (NKHHC): Secondary | ICD-10-CM

## 2023-11-08 NOTE — Telephone Encounter (Signed)
 I called patient so I could provide him the phone number for [facility] to call to check on the status of his PCS request; but I could not leave a message, his voicemail was not set up.   NCL[facility]: [phone]

## 2023-11-08 NOTE — Pre-Procedure Instructions (Signed)
 Called pt for telephone interview. Pt stated that 0630 was too early for him and was going to see if he could change his time for later.

## 2023-11-09 ENCOUNTER — Emergency Department (HOSPITAL_COMMUNITY)

## 2023-11-09 ENCOUNTER — Other Ambulatory Visit: Payer: Self-pay

## 2023-11-09 ENCOUNTER — Inpatient Hospital Stay (HOSPITAL_COMMUNITY)
Admission: EM | Admit: 2023-11-09 | Discharge: 2023-11-13 | DRG: 391 | Disposition: A | Discharge status: Home / Self Care | Attending: Internal Medicine | Admitting: Internal Medicine

## 2023-11-09 DIAGNOSIS — A09 Infectious gastroenteritis and colitis, unspecified: Principal | ICD-10-CM | POA: Diagnosis present

## 2023-11-09 DIAGNOSIS — Z823 Family history of stroke: Secondary | ICD-10-CM

## 2023-11-09 DIAGNOSIS — E7800 Pure hypercholesterolemia, unspecified: Secondary | ICD-10-CM | POA: Diagnosis present

## 2023-11-09 DIAGNOSIS — R1110 Vomiting, unspecified: Secondary | ICD-10-CM | POA: Diagnosis present

## 2023-11-09 DIAGNOSIS — K529 Noninfective gastroenteritis and colitis, unspecified: Secondary | ICD-10-CM | POA: Diagnosis not present

## 2023-11-09 DIAGNOSIS — E1122 Type 2 diabetes mellitus with diabetic chronic kidney disease: Secondary | ICD-10-CM | POA: Diagnosis present

## 2023-11-09 DIAGNOSIS — Z794 Long term (current) use of insulin: Secondary | ICD-10-CM

## 2023-11-09 DIAGNOSIS — K209 Esophagitis, unspecified without bleeding: Secondary | ICD-10-CM | POA: Diagnosis present

## 2023-11-09 DIAGNOSIS — N2581 Secondary hyperparathyroidism of renal origin: Secondary | ICD-10-CM | POA: Diagnosis present

## 2023-11-09 DIAGNOSIS — I5032 Chronic diastolic (congestive) heart failure: Secondary | ICD-10-CM | POA: Diagnosis present

## 2023-11-09 DIAGNOSIS — Z8249 Family history of ischemic heart disease and other diseases of the circulatory system: Secondary | ICD-10-CM

## 2023-11-09 DIAGNOSIS — E109 Type 1 diabetes mellitus without complications: Secondary | ICD-10-CM | POA: Diagnosis present

## 2023-11-09 DIAGNOSIS — Z888 Allergy status to other drugs, medicaments and biological substances status: Secondary | ICD-10-CM

## 2023-11-09 DIAGNOSIS — R112 Nausea with vomiting, unspecified: Secondary | ICD-10-CM | POA: Diagnosis present

## 2023-11-09 DIAGNOSIS — R9431 Abnormal electrocardiogram [ECG] [EKG]: Secondary | ICD-10-CM | POA: Diagnosis present

## 2023-11-09 DIAGNOSIS — I160 Hypertensive urgency: Secondary | ICD-10-CM | POA: Diagnosis present

## 2023-11-09 DIAGNOSIS — Z79899 Other long term (current) drug therapy: Secondary | ICD-10-CM

## 2023-11-09 DIAGNOSIS — N186 End stage renal disease: Secondary | ICD-10-CM

## 2023-11-09 DIAGNOSIS — D631 Anemia in chronic kidney disease: Secondary | ICD-10-CM | POA: Diagnosis present

## 2023-11-09 DIAGNOSIS — Z992 Dependence on renal dialysis: Secondary | ICD-10-CM

## 2023-11-09 DIAGNOSIS — H547 Unspecified visual loss: Secondary | ICD-10-CM | POA: Diagnosis present

## 2023-11-09 DIAGNOSIS — I132 Hypertensive heart and chronic kidney disease with heart failure and with stage 5 chronic kidney disease, or end stage renal disease: Secondary | ICD-10-CM | POA: Diagnosis present

## 2023-11-09 DIAGNOSIS — Z89421 Acquired absence of other right toe(s): Secondary | ICD-10-CM

## 2023-11-09 DIAGNOSIS — Z833 Family history of diabetes mellitus: Secondary | ICD-10-CM

## 2023-11-09 LAB — CBC
HCT: 40.5 % (ref 39.0–52.0)
Hemoglobin: 12.9 g/dL — ABNORMAL LOW (ref 13.0–17.0)
MCH: 27 pg (ref 26.0–34.0)
MCHC: 31.9 g/dL (ref 30.0–36.0)
MCV: 84.7 fL (ref 80.0–100.0)
Platelets: 260 10*3/uL (ref 150–400)
RBC: 4.78 MIL/uL (ref 4.22–5.81)
RDW: 16.1 % — ABNORMAL HIGH (ref 11.5–15.5)
WBC: 8.1 10*3/uL (ref 4.0–10.5)
nRBC: 0 % (ref 0.0–0.2)

## 2023-11-09 LAB — COVID-19, FLU A+B AND RSV
Influenza A, NAA: NOT DETECTED
Influenza B, NAA: NOT DETECTED
RSV, NAA: NOT DETECTED
SARS-CoV-2, NAA: NOT DETECTED

## 2023-11-09 LAB — COMPREHENSIVE METABOLIC PANEL WITH GFR
ALT: 15 U/L (ref 0–44)
AST: 20 U/L (ref 15–41)
Albumin: 4.3 g/dL (ref 3.5–5.0)
Alkaline Phosphatase: 62 U/L (ref 38–126)
Anion gap: 27 — ABNORMAL HIGH (ref 5–15)
BUN: 62 mg/dL — ABNORMAL HIGH (ref 6–20)
CO2: 18 mmol/L — ABNORMAL LOW (ref 22–32)
Calcium: 8 mg/dL — ABNORMAL LOW (ref 8.9–10.3)
Chloride: 90 mmol/L — ABNORMAL LOW (ref 98–111)
Creatinine, Ser: 15.35 mg/dL — ABNORMAL HIGH (ref 0.61–1.24)
GFR, Estimated: 4 mL/min — ABNORMAL LOW (ref >60)
Glucose, Bld: 179 mg/dL — ABNORMAL HIGH (ref 70–99)
Potassium: 5 mmol/L (ref 3.5–5.1)
Sodium: 135 mmol/L (ref 135–145)
Total Bilirubin: 0.7 mg/dL (ref 0.0–1.2)
Total Protein: 9 g/dL — ABNORMAL HIGH (ref 6.5–8.1)

## 2023-11-09 LAB — LIPASE, BLOOD: Lipase: 27 U/L (ref 11–51)

## 2023-11-09 MED ORDER — IOHEXOL 350 MG/ML SOLN
75.0000 mL | Freq: Once | INTRAVENOUS | Status: AC | PRN
Start: 2023-11-09 — End: 2023-11-09
  Administered 2023-11-09: 75 mL via INTRAVENOUS

## 2023-11-09 MED ORDER — SODIUM CHLORIDE 0.9 % IV BOLUS
1000.0000 mL | Freq: Once | INTRAVENOUS | Status: AC
  Administered 2023-11-09: 1000 mL via INTRAVENOUS

## 2023-11-09 MED ORDER — ONDANSETRON HCL 4 MG/2ML IJ SOLN
4.0000 mg | Freq: Once | INTRAMUSCULAR | Status: AC | PRN
  Administered 2023-11-09: 4 mg via INTRAVENOUS
  Filled 2023-11-09: qty 2

## 2023-11-09 MED ORDER — PANTOPRAZOLE SODIUM 40 MG IV SOLR
40.0000 mg | Freq: Once | INTRAVENOUS | Status: AC
  Administered 2023-11-09: 40 mg via INTRAVENOUS
  Filled 2023-11-09: qty 10

## 2023-11-09 MED ORDER — HYDROMORPHONE HCL 1 MG/ML IJ SOLN
0.5000 mg | Freq: Once | INTRAMUSCULAR | Status: AC
  Administered 2023-11-09: 0.5 mg via INTRAVENOUS
  Filled 2023-11-09: qty 1

## 2023-11-09 MED ORDER — METOCLOPRAMIDE HCL 5 MG/ML IJ SOLN
10.0000 mg | Freq: Once | INTRAMUSCULAR | Status: AC
  Administered 2023-11-09: 10 mg via INTRAVENOUS
  Filled 2023-11-09: qty 2

## 2023-11-09 NOTE — Discharge Instructions (Addendum)
 I am glad you are feeling better.  Please follow-up with primary care provider.  Seek emergency care experiencing any new or worsening symptoms.

## 2023-11-09 NOTE — Telephone Encounter (Signed)
 I tried to reach the patient again and his voicemail was not set up.

## 2023-11-09 NOTE — Anesthesia Preprocedure Evaluation (Signed)
 Anesthesia Evaluation  Patient identified by MRN, date of birth, ID band Patient awake    Reviewed: Allergy & Precautions, H&P , NPO status , Patient's Chart, lab work & pertinent test results, reviewed documented beta blocker date and time   Airway Mallampati: II  TM Distance: >3 FB Neck ROM: full    Dental no notable dental hx. (+) Dental Advisory Given, Teeth Intact   Pulmonary neg pulmonary ROS   Pulmonary exam normal breath sounds clear to auscultation       Cardiovascular Exercise Tolerance: Good hypertension, +CHF  Normal cardiovascular exam Rhythm:regular Rate:Normal  Hypertrophic cardiomyopathy.   Good EG  ? Endocarditis vegetations   Neuro/Psych Seizures -,  PSYCHIATRIC DISORDERS         GI/Hepatic negative GI ROS, Neg liver ROS,,,  Endo/Other  diabetes, Poorly Controlled, Type 2    Renal/GU ESRF and DialysisRenal disease  negative genitourinary   Musculoskeletal   Abdominal   Peds  Hematology negative hematology ROS (+) Blood dyscrasia, anemia   Anesthesia Other Findings   Reproductive/Obstetrics negative OB ROS                             Anesthesia Physical Anesthesia Plan  ASA: 3 and emergent  Anesthesia Plan: General   Post-op Pain Management: Minimal or no pain anticipated   Induction: Intravenous  PONV Risk Score and Plan: Propofol  infusion  Airway Management Planned: Nasal Cannula and Natural Airway  Additional Equipment: None  Intra-op Plan:   Post-operative Plan:   Informed Consent: I have reviewed the patients History and Physical, chart, labs and discussed the procedure including the risks, benefits and alternatives for the proposed anesthesia with the patient or authorized representative who has indicated his/her understanding and acceptance.     Dental Advisory Given  Plan Discussed with: CRNA  Anesthesia Plan Comments:        Anesthesia  Quick Evaluation

## 2023-11-09 NOTE — ED Triage Notes (Signed)
 Patient reports vomiting since noon and he is in a lot of pain and is concerned for esophagitis because he had that in January. Patient reports no one else has been vomiting in the household.

## 2023-11-09 NOTE — ED Provider Notes (Signed)
 [place] EMERGENCY DEPARTMENT AT [facility] Provider Note   CSN: [id number] Arrival date & time: 11/09/23  1907     History  Chief Complaint  Patient presents with   Emesis    [name] is a 38 y.o. male with PMHx ADHD, DM, ESRD on hemodialysis TThS, HLD, HTN who presents to ED concerned for vomiting since noon today. Also with upper abdominal pain. Patient also endorses some diarrhea yesterday which has since resolved. Denies fever. Does also endorse some rhinorrhea, congestion, and mild cough as well recently.  Patient's last dialysis was yesterday.    Emesis      Home Medications Prior to Admission medications   Medication Sig Start Date End Date Taking? Authorizing Provider  acetaminophen  (TYLENOL ) 325 MG tablet Take 2 tablets (650 mg total) by mouth every 6 (six) hours as needed for mild pain (or Fever >/= 101). 12/30/21   Samtani, Jai-Gurmukh, MD  atorvastatin  (LIPITOR) 40 MG tablet Take 1 tablet (40 mg total) by mouth daily. 01/18/23 10/26/23  Iva Mariner, MD  benzonatate  (TESSALON  PERLES) 100 MG capsule Take 1 capsule (100 mg total) by mouth 3 (three) times daily as needed. 11/07/23   Senaida Dama, NP  calcitRIOL  (ROCALTROL ) 0.5 MCG capsule Take 2 capsules (1 mcg total) by mouth Every Tuesday,Thursday,and Saturday with dialysis. 12/31/21   Samtani, Jai-Gurmukh, MD  carvedilol  (COREG ) 25 MG tablet Take 1 tablet (25 mg total) by mouth 2 (two) times daily with a meal. 01/18/23 10/26/23  Iva Mariner, MD  cloNIDine  (CATAPRES ) 0.1 MG tablet Take 1 tablet (0.1 mg total) by mouth 2 (two) times daily. 01/18/23   Iva Mariner, MD  diphenhydramine -acetaminophen  (TYLENOL  PM) 25-500 MG TABS tablet Take 2 tablets by mouth 2 (two) times daily as needed (pain, sleep).    [provider]  insulin  glargine-yfgn (SEMGLEE ) 100 UNIT/ML injection Inject 0.12 mLs (12 Units total) into the skin at bedtime. 11/07/23   Senaida Dama, NP  insulin  lispro (HUMALOG ) 100 UNIT/ML  injection Inject 0.01-0.07 mLs (1-7 Units total) into the skin See admin instructions. Per sliding scale 3 times daily with meals 151-200= 1 unit 201-250= 2 units 251-300= 3 units 301-350= 5 units 351-400= 7 units Greater than 400 call md takes only as needed if blood sugar is over 200 11/07/23   Senaida Dama, NP  Insulin  Pen Needle (PEN NEEDLES) 31G X 8 MM MISC 1 each by Other route at bedtime. UAD 11/07/23   Senaida Dama, NP  Multiple Vitamins-Minerals (MULTIVITAMIN WITH MINERALS) tablet Take 1 tablet by mouth daily.    [provider]  pantoprazole  (PROTONIX ) 40 MG tablet TAKE 1 TABLET (40 MG TOTAL) BY MOUTH 2 (TWO) TIMES DAILY. 11/03/23 11/02/24  Delman Ferns, NP  sodium zirconium cyclosilicate  (LOKELMA ) 10 g PACK packet Take 10 g by mouth See admin instructions. Take 1 packet (10g) once daily on non-dialysis days (Sunday, Monday, Wednesday, Friday)    [provider]  sucralfate  (CARAFATE ) 1 GM/10ML suspension Take 10 mLs (1 g total) by mouth 4 (four) times daily. 10/26/23   Delman Ferns, NP      Allergies    Apresoline  [hydralazine ] and Norvasc  Darrian.Curl ]    Review of Systems   Review of Systems  Gastrointestinal:  Positive for vomiting.    Physical Exam Updated Vital Signs BP (!) 156/112   Pulse (!) 103   Temp 98 F (36.7 C)   Resp 16   Ht 6' (1.829 m)  Wt 90.8 kg   SpO2 99%   BMI 27.15 kg/m  Physical Exam Vitals and nursing note reviewed.  Constitutional:      General: He is not in acute distress.    Appearance: He is not ill-appearing or toxic-appearing.  HENT:     Head: Normocephalic and atraumatic.     Mouth/Throat:     Mouth: Mucous membranes are moist.  Eyes:     General: No scleral icterus.       Right eye: No discharge.        Left eye: No discharge.     Conjunctiva/sclera: Conjunctivae normal.  Cardiovascular:     Rate and Rhythm: Regular rhythm. Tachycardia present.     Pulses: Normal pulses.     Heart sounds: Normal heart sounds.  No murmur heard. Pulmonary:     Effort: Pulmonary effort is normal. No respiratory distress.     Breath sounds: Normal breath sounds. No wheezing, rhonchi or rales.  Abdominal:     General: Abdomen is flat. Bowel sounds are normal. There is no distension.     Palpations: Abdomen is soft. There is no mass.     Tenderness: There is abdominal tenderness.     Comments: Epigastric tenderness to palpation  Musculoskeletal:     Right lower leg: No edema.     Left lower leg: No edema.  Skin:    General: Skin is warm and dry.     Findings: No rash.  Neurological:     General: No focal deficit present.     Mental Status: He is alert and oriented to person, place, and time. Mental status is at baseline.  Psychiatric:        Mood and Affect: Mood normal.     ED Results / Procedures / Treatments   Labs (all labs ordered are listed, but only abnormal results are displayed) Labs Reviewed  COMPREHENSIVE METABOLIC PANEL WITH GFR - Abnormal; Notable for the following components:      Result Value   Chloride 90 (*)    CO2 18 (*)    Glucose, Bld 179 (*)    BUN 62 (*)    Creatinine, Ser 15.35 (*)    Calcium  8.0 (*)    Total Protein 9.0 (*)    GFR, Estimated 4 (*)    Anion gap 27 (*)    All other components within normal limits  CBC - Abnormal; Notable for the following components:   Hemoglobin 12.9 (*)    RDW 16.1 (*)    All other components within normal limits  LIPASE, BLOOD    EKG None  Radiology CT ABDOMEN PELVIS W CONTRAST Result Date: 11/09/2023 CLINICAL DATA:  Abdominal pain EXAM: CT ABDOMEN AND PELVIS WITH CONTRAST TECHNIQUE: Multidetector CT imaging of the abdomen and pelvis was performed using the standard protocol following bolus administration of intravenous contrast. RADIATION DOSE REDUCTION: This exam was performed according to the departmental dose-optimization program which includes automated exposure control, adjustment of the mA and/or kV according to patient size  and/or use of iterative reconstruction technique. CONTRAST:  75mL OMNIPAQUE  IOHEXOL  350 MG/ML SOLN COMPARISON:  08/04/2023 FINDINGS: Lower chest: Dependent ground-glass consolidation within the left lower lobe may be inflammatory, infectious, or hypoventilatory. Stable cardiomegaly. Hepatobiliary: No focal liver abnormality is seen. No gallstones, gallbladder wall thickening, or biliary dilatation. Pancreas: Unremarkable. No pancreatic ductal dilatation or surrounding inflammatory changes. Spleen: Normal in size without focal abnormality. Adrenals/Urinary Tract: Horseshoe configuration of the kidney. Punctate less than 2 mm  nonobstructing calculi within the left moiety. The adrenals are unremarkable. The bladder is decompressed, limiting its evaluation. Stomach/Bowel: No bowel obstruction or ileus. Normal appendix right lower quadrant. There is diffuse colonic wall thickening most pronounced from the splenic flexure through the rectum, compatible with inflammatory or infectious colitis. Vascular/Lymphatic: No significant vascular findings are present. No enlarged abdominal or pelvic lymph nodes. Reproductive: Prostate is unremarkable. Other: No free fluid or free intraperitoneal gas. No abdominal wall hernia. Musculoskeletal: No acute or destructive bony abnormalities. Reconstructed images demonstrate no additional findings. IMPRESSION: 1. Mild diffuse colonic wall thickening greatest from the splenic flexure through the rectum, consistent with inflammatory or infectious colitis. 2. Dependent ground-glass attenuation within the left lower lobe, which may be hypoventilatory or related to inflammatory/infectious etiology. 3. 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 Electronically Signed   By: Bobbye Burrow M.D.   On: 11/09/2023 22:05    Procedures Procedures    Medications Ordered in ED Medications  ondansetron  (ZOFRAN ) injection 4 mg (4 mg Intravenous Given 11/09/23  2010)  sodium chloride  0.9 % bolus 1,000 mL (0 mLs Intravenous Stopped 11/09/23 2115)  pantoprazole  (PROTONIX ) injection 40 mg (40 mg Intravenous Given 11/09/23 2031)  HYDROmorphone  (DILAUDID ) injection 0.5 mg (0.5 mg Intravenous Given 11/09/23 2030)  iohexol  (OMNIPAQUE ) 350 MG/ML injection 75 mL (75 mLs Intravenous Contrast Given 11/09/23 2157)    ED Course/ Medical Decision Making/ A&P                                 Medical Decision Making Amount and/or Complexity of Data Reviewed Labs: ordered. Radiology: ordered.  Risk Prescription drug management.    This patient presents to the ED for concern of abdominal pain, this involves an extensive number of treatment options, and is a complaint that carries with it a high risk of complications and morbidity.  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  Also with many episodes of vomiting since noon today.  Also endorses diarrhea yesterday which has since resolved.   I Ordered, and personally interpreted labs.  CMP with low chloride and CO2 which is leading to anion gap; however, this appears to be d/t patient's recent severe vomiting. BUN/Cr elevated at patient's baseline. Lipase within normal limits. CBC without leukocytosis. There is mild anemia with Hgb 12.9.  I ordered imaging studies including CT Abd/Pelvis with contrast: evaluate for structural/surgical etiology of patients' severe abdominal pain. I independently visualized and interpreted imaging and I agree with the radiologist interpretation of mild colitis. I personally  viewed and interpreted the EKG/cardiac monitored which showed an underlying rhythm of: sinus rhythm.  Patient feeling a lot better after 1 round of nausea and pain medications. Patient tolerating PO. I have reviewed the patients home medicines and have made adjustments as needed   Social Determinants of Health:  none  12AM Care of [name] transferred to [facility] at the end of my shift as the patient will require reassessment once labs/imaging have resulted. Patient presentation, ED course, and plan of care discussed with review of all pertinent labs and imaging. Please  see his/her note for further details regarding further ED course and disposition. Plan at time of handoff is reassess patient after medications. This may be altered or completely changed at the discretion of the oncoming team pending results of further workup.          Final Clinical Impression(s) / ED Diagnoses Final diagnoses:  None    Rx / DC Orders ED Discharge Orders     None         [place] Bureau, [place] 11/09/23 2358    Hershel Los, MD 11/11/23 1501

## 2023-11-09 NOTE — ED Notes (Signed)
 Pt provided with water for PO challenge.

## 2023-11-10 ENCOUNTER — Encounter: Payer: Self-pay | Admitting: Family

## 2023-11-10 ENCOUNTER — Encounter (HOSPITAL_COMMUNITY): Payer: Self-pay | Admitting: Anesthesiology

## 2023-11-10 ENCOUNTER — Encounter (HOSPITAL_COMMUNITY): Payer: Self-pay | Admitting: Family Medicine

## 2023-11-10 ENCOUNTER — Encounter (HOSPITAL_COMMUNITY)
Admission: EM | Disposition: A | Discharge status: Home / Self Care | Payer: Self-pay | Source: Home / Self Care | Attending: Internal Medicine

## 2023-11-10 ENCOUNTER — Ambulatory Visit (HOSPITAL_COMMUNITY): Admission: RE | Admit: 2023-11-10 | Source: Home / Self Care | Admitting: Gastroenterology

## 2023-11-10 DIAGNOSIS — Z992 Dependence on renal dialysis: Secondary | ICD-10-CM

## 2023-11-10 DIAGNOSIS — N186 End stage renal disease: Secondary | ICD-10-CM | POA: Diagnosis not present

## 2023-11-10 DIAGNOSIS — I160 Hypertensive urgency: Secondary | ICD-10-CM

## 2023-11-10 DIAGNOSIS — I5032 Chronic diastolic (congestive) heart failure: Secondary | ICD-10-CM | POA: Diagnosis not present

## 2023-11-10 DIAGNOSIS — E1122 Type 2 diabetes mellitus with diabetic chronic kidney disease: Secondary | ICD-10-CM

## 2023-11-10 DIAGNOSIS — R112 Nausea with vomiting, unspecified: Secondary | ICD-10-CM | POA: Diagnosis present

## 2023-11-10 DIAGNOSIS — R9431 Abnormal electrocardiogram [ECG] [EKG]: Secondary | ICD-10-CM | POA: Diagnosis present

## 2023-11-10 LAB — BASIC METABOLIC PANEL WITH GFR
Anion gap: 19 — ABNORMAL HIGH (ref 5–15)
BUN: 65 mg/dL — ABNORMAL HIGH (ref 6–20)
CO2: 21 mmol/L — ABNORMAL LOW (ref 22–32)
Calcium: 7.4 mg/dL — ABNORMAL LOW (ref 8.9–10.3)
Chloride: 92 mmol/L — ABNORMAL LOW (ref 98–111)
Creatinine, Ser: 15.57 mg/dL — ABNORMAL HIGH (ref 0.61–1.24)
GFR, Estimated: 4 mL/min — ABNORMAL LOW (ref >60)
Glucose, Bld: 155 mg/dL — ABNORMAL HIGH (ref 70–99)
Potassium: 5.1 mmol/L (ref 3.5–5.1)
Sodium: 132 mmol/L — ABNORMAL LOW (ref 135–145)

## 2023-11-10 LAB — HEPATITIS B SURFACE ANTIGEN: Hepatitis B Surface Ag: NONREACTIVE

## 2023-11-10 LAB — CBC
HCT: 37.7 % — ABNORMAL LOW (ref 39.0–52.0)
Hemoglobin: 12.2 g/dL — ABNORMAL LOW (ref 13.0–17.0)
MCH: 27.1 pg (ref 26.0–34.0)
MCHC: 32.4 g/dL (ref 30.0–36.0)
MCV: 83.8 fL (ref 80.0–100.0)
Platelets: 252 10*3/uL (ref 150–400)
RBC: 4.5 MIL/uL (ref 4.22–5.81)
RDW: 16.3 % — ABNORMAL HIGH (ref 11.5–15.5)
WBC: 7.5 10*3/uL (ref 4.0–10.5)
nRBC: 0 % (ref 0.0–0.2)

## 2023-11-10 LAB — PHOSPHORUS: Phosphorus: 6.8 mg/dL — ABNORMAL HIGH (ref 2.5–4.6)

## 2023-11-10 LAB — GLUCOSE, CAPILLARY
Glucose-Capillary: 150 mg/dL — ABNORMAL HIGH (ref 70–99)
Glucose-Capillary: 150 mg/dL — ABNORMAL HIGH (ref 70–99)
Glucose-Capillary: 114 mg/dL — ABNORMAL HIGH (ref 70–99)
Glucose-Capillary: 131 mg/dL — ABNORMAL HIGH (ref 70–99)
Glucose-Capillary: 108 mg/dL — ABNORMAL HIGH (ref 70–99)
Glucose-Capillary: 152 mg/dL — ABNORMAL HIGH (ref 70–99)

## 2023-11-10 LAB — HEMOGLOBIN A1C
Hgb A1c MFr Bld: 6.4 % — ABNORMAL HIGH (ref 4.8–5.6)
Mean Plasma Glucose: 136.98 mg/dL

## 2023-11-10 LAB — MAGNESIUM: Magnesium: 2.2 mg/dL (ref 1.7–2.4)

## 2023-11-10 LAB — HIV ANTIBODY (ROUTINE TESTING W REFLEX): HIV Screen 4th Generation wRfx: NONREACTIVE

## 2023-11-10 SURGERY — EGD (ESOPHAGOGASTRODUODENOSCOPY)
Anesthesia: Choice

## 2023-11-10 MED ORDER — OXYCODONE HCL 5 MG PO TABS
5.0000 mg | ORAL_TABLET | Freq: Four times a day (QID) | ORAL | Status: DC | PRN
  Administered 2023-11-11 – 2023-11-12 (×4): 5 mg via ORAL
  Filled 2023-11-10 (×4): qty 1

## 2023-11-10 MED ORDER — INSULIN ASPART 100 UNIT/ML IJ SOLN
0.0000 [IU] | INTRAMUSCULAR | Status: DC
  Administered 2023-11-10 – 2023-11-12 (×2): 1 [IU] via SUBCUTANEOUS

## 2023-11-10 MED ORDER — ACETAMINOPHEN 325 MG PO TABS
650.0000 mg | ORAL_TABLET | Freq: Four times a day (QID) | ORAL | Status: DC | PRN
  Administered 2023-11-10 – 2023-11-13 (×3): 650 mg via ORAL
  Filled 2023-11-10 (×2): qty 2

## 2023-11-10 MED ORDER — ACETAMINOPHEN 650 MG RE SUPP: 650.0000 mg | Freq: Four times a day (QID) | RECTAL | Status: DC | PRN

## 2023-11-10 MED ORDER — INSULIN GLARGINE-YFGN 100 UNIT/ML ~~LOC~~ SOLN
5.0000 [IU] | Freq: Every day | SUBCUTANEOUS | Status: DC
  Administered 2023-11-10 – 2023-11-12 (×3): 5 [IU] via SUBCUTANEOUS
  Filled 2023-11-10 (×4): qty 0.05

## 2023-11-10 MED ORDER — ACETAMINOPHEN 325 MG PO TABS
ORAL_TABLET | ORAL | Status: AC
  Filled 2023-11-10: qty 2

## 2023-11-10 MED ORDER — TRIMETHOBENZAMIDE HCL 100 MG/ML IM SOLN
200.0000 mg | Freq: Four times a day (QID) | INTRAMUSCULAR | Status: DC | PRN
  Administered 2023-11-10 – 2023-11-11 (×4): 200 mg via INTRAMUSCULAR
  Filled 2023-11-10 (×5): qty 2

## 2023-11-10 MED ORDER — HEPARIN SODIUM (PORCINE) 1000 UNIT/ML DIALYSIS: 1000.0000 [IU] | INTRAMUSCULAR | Status: DC | PRN

## 2023-11-10 MED ORDER — HYDROMORPHONE HCL 1 MG/ML IJ SOLN
0.5000 mg | INTRAMUSCULAR | Status: DC | PRN
  Administered 2023-11-10 – 2023-11-12 (×5): 0.5 mg via INTRAVENOUS
  Filled 2023-11-10 (×5): qty 1

## 2023-11-10 MED ORDER — CLONIDINE HCL 0.1 MG PO TABS
0.1000 mg | ORAL_TABLET | Freq: Two times a day (BID) | ORAL | Status: DC
  Administered 2023-11-10 – 2023-11-13 (×6): 0.1 mg via ORAL
  Filled 2023-11-10 (×7): qty 1

## 2023-11-10 MED ORDER — PANTOPRAZOLE SODIUM 40 MG IV SOLR
40.0000 mg | Freq: Two times a day (BID) | INTRAVENOUS | Status: DC
  Administered 2023-11-10 – 2023-11-13 (×7): 40 mg via INTRAVENOUS
  Filled 2023-11-10 (×7): qty 10

## 2023-11-10 MED ORDER — ANTICOAGULANT SODIUM CITRATE 4% (200MG/5ML) IV SOLN: 5.0000 mL | Status: DC | PRN

## 2023-11-10 MED ORDER — ALTEPLASE 2 MG IJ SOLR: 2.0000 mg | Freq: Once | INTRAMUSCULAR | Status: DC | PRN

## 2023-11-10 MED ORDER — HEPARIN SODIUM (PORCINE) 1000 UNIT/ML DIALYSIS: 1500.0000 [IU] | INTRAMUSCULAR | Status: DC | PRN

## 2023-11-10 MED ORDER — CHLORHEXIDINE GLUCONATE CLOTH 2 % EX PADS
6.0000 | MEDICATED_PAD | Freq: Every day | CUTANEOUS | Status: DC
  Administered 2023-11-11 – 2023-11-13 (×2): 6 via TOPICAL

## 2023-11-10 MED ORDER — LIDOCAINE-PRILOCAINE 2.5-2.5 % EX CREA: 1.0000 | TOPICAL_CREAM | CUTANEOUS | Status: DC | PRN

## 2023-11-10 MED ORDER — NEPRO/CARBSTEADY PO LIQD: 237.0000 mL | ORAL | Status: DC | PRN

## 2023-11-10 MED ORDER — LABETALOL HCL 100 MG PO TABS
100.0000 mg | ORAL_TABLET | Freq: Two times a day (BID) | ORAL | Status: DC
  Administered 2023-11-10 – 2023-11-13 (×7): 100 mg via ORAL
  Filled 2023-11-10 (×8): qty 1

## 2023-11-10 MED ORDER — HEPARIN SODIUM (PORCINE) 1000 UNIT/ML DIALYSIS: 2500.0000 [IU] | Freq: Once | INTRAMUSCULAR | Status: DC

## 2023-11-10 MED ORDER — SODIUM CHLORIDE 0.9% FLUSH
3.0000 mL | Freq: Two times a day (BID) | INTRAVENOUS | Status: DC
  Administered 2023-11-10 – 2023-11-12 (×7): 3 mL via INTRAVENOUS

## 2023-11-10 MED ORDER — CARVEDILOL 12.5 MG PO TABS: 25.0000 mg | ORAL_TABLET | Freq: Two times a day (BID) | ORAL | Status: DC

## 2023-11-10 MED ORDER — PENTAFLUOROPROP-TETRAFLUOROETH EX AERO: 1.0000 | INHALATION_SPRAY | CUTANEOUS | Status: DC | PRN

## 2023-11-10 MED ORDER — LABETALOL HCL 5 MG/ML IV SOLN
10.0000 mg | INTRAVENOUS | Status: DC | PRN
  Administered 2023-11-10: 10 mg via INTRAVENOUS
  Filled 2023-11-10: qty 4

## 2023-11-10 MED ORDER — LIDOCAINE HCL (PF) 1 % IJ SOLN: 5.0000 mL | INTRAMUSCULAR | Status: DC | PRN

## 2023-11-10 NOTE — Care Management Obs Status (Signed)
 MEDICARE OBSERVATION STATUS NOTIFICATION   Patient Details  Name: [name] MRN: [id number] Date of Birth: [birth year]   Medicare Observation Status Notification Given:  Yes    Janith Melnick 11/10/2023, 3:03 PM

## 2023-11-10 NOTE — Plan of Care (Signed)

## 2023-11-10 NOTE — H&P (Signed)
 History and Physical    [name] NWG:[id number] DOB: Dec 05, 1985 DOA: 11/09/2023  PCP: Senaida Dama, NP   Patient coming from: Home   Chief Complaint: Abdominal pain, vomiting   HPI: [name] is a 38 y.o. male with medical history significant for insulin -dependent diabetes mellitus, hypertension, chronic diastolic CHF, blindness, and ESRD on hemodialysis, now presenting with abdominal pain, nausea, and vomiting.  Patient reports upper abdominal pain with nausea and frequent vomiting since around noon on 11/09/2023.  He had some loose stools on 11/08/2023, but that has resolved. He denied fevers or sick contacts.   ED Course: Upon arrival to the ED, patient is found to be afebrile and saturating low 90s on room air with slightly elevated heart rate and severely elevated blood pressures.  Labs are most notable for bicarbonate 18, BUN 62, normal WBC, normal lipase, and hemoglobin 12.9.  CT findings suggestive of mild inflammatory or infectious colitis.  Patient was treated in the ED with 1 L NS, IV Protonix , Reglan , Zofran , and Dilaudid .  Review of Systems:  All other systems reviewed and apart from HPI, are negative.  Past Medical History:  Diagnosis Date   ADHD (attention deficit hyperactivity disorder)    Diabetes mellitus    ESRD on hemodialysis (HCC)    [facility]   High cholesterol    Hypertension    Hypertrophic cardiomyopathy (HCC) 02/26/2021   Seizure (HCC) 02/08/2021    Past Surgical History:  Procedure Laterality Date   AMPUTATION TOE Right 12/29/2021   Procedure: AMPUTATION TOE, fifth;  Surgeon: Jennefer Moats, DPM;  Location: [facility] OR;  Service: Podiatry;  Laterality: Right;  surgical team will do local block   AV FISTULA PLACEMENT Left 08/11/2020   Procedure: LEFT UPPER EXTREMITY ARTERIOVENOUS (AV) FISTULA CREATION;  Surgeon: Carlene Che, MD;  Location: [facility] OR;  Service: Vascular;  Laterality: Left;   BIOPSY  08/04/2023   Procedure: BIOPSY;   Surgeon: Hargis Lias, MD;  Location: [facility] ENDO SUITE;  Service: Endoscopy;;   ESOPHAGOGASTRODUODENOSCOPY (EGD) WITH PROPOFOL  N/A 08/04/2023   Procedure: ESOPHAGOGASTRODUODENOSCOPY (EGD) WITH PROPOFOL ;  Surgeon: Hargis Lias, MD;  Location: [facility];  Service: Endoscopy;  Laterality: N/A;   FISTULA SUPERFICIALIZATION Left 12/02/2021   Procedure: PLICATION OF LEFT RADIUS CEPHALIC FISTULA;  Surgeon: Carlene Che, MD;  Location: [facility] OR;  Service: Vascular;  Laterality: Left;  PERIPHERAL NERVE BLOCK   FRACTURE SURGERY     I & D EXTREMITY Left 07/16/2014   Procedure: IRRIGATION AND DEBRIDEMENT EXTREMITY/LEFT INDEX FINGER;  Surgeon: Brunilda Capra, MD;  Location: [facility] OR;  Service: Orthopedics;  Laterality: Left;   IR FLUORO GUIDE CV LINE RIGHT  10/04/2022   IR FLUORO GUIDE CV LINE RIGHT  10/05/2022   IR PERC TUN PERIT CATH WO PORT S&I /IMAG  08/07/2020   IR REMOVAL TUN CV CATH W/O FL  10/27/2022   IR THROMBECTOMY AV FISTULA W/THROMBOLYSIS/PTA INC/SHUNT/IMG LEFT Left 10/05/2022   IR US  GUIDE VASC ACCESS RIGHT  08/07/2020   IR US  GUIDE VASC ACCESS RIGHT  10/04/2022   IR US  GUIDE VASC ACCESS RIGHT  10/05/2022   TEE WITHOUT CARDIOVERSION N/A 10/28/2022   Procedure: TRANSESOPHAGEAL ECHOCARDIOGRAM;  Surgeon: Jerryl Morin, DO;  Location: [facility] INVASIVE CV LAB;  Service: Cardiovascular;  Laterality: N/A;    Social History:   reports that he has never smoked. He has never been exposed to tobacco smoke. He has never used smokeless tobacco. He reports that he does not currently  use alcohol. He reports that he does not use drugs.  Allergies  Allergen Reactions   Apresoline  [Hydralazine ] Nausea And Vomiting and Other (See Comments)    Patient was taking Hydralazine  AND Amlodipine  at the same time, so the reactions came from one of the 2: Lethargy and an all-over feeling of NOT feeling well   Norvasc  [Amlodipine ] Nausea And Vomiting and Other (See Comments)    Patient was taking Amlodipine  AND Hydralazine  at the  same time, so the reactions came from one of the 2: Lethargy and an all-over feeling of NOT feeling well     Family History  Problem Relation Age of Onset   Cancer Mother    Stroke Father    Diabetes Father    Hypertension Father      Prior to Admission medications   Medication Sig Start Date End Date Taking? Authorizing Provider  atorvastatin  (LIPITOR) 40 MG tablet Take 1 tablet (40 mg total) by mouth daily. Patient taking differently: Take 40 mg by mouth daily after supper. 01/18/23 05/08/24 Yes Iva Mariner, MD  benzonatate  (TESSALON  PERLES) 100 MG capsule Take 1 capsule (100 mg total) by mouth 3 (three) times daily as needed. 11/07/23  Yes Rogerio Clay, Amy J, NP  insulin  lispro (HUMALOG ) 100 UNIT/ML KwikPen Inject into the skin. 10/24/23  Yes [provider]  labetalol  (NORMODYNE ) 200 MG tablet Take 200 mg by mouth 2 (two) times daily. 10/24/23  Yes [provider]  minoxidil (LONITEN) 2.5 MG tablet Take 2.5 mg by mouth 2 (two) times daily. 10/24/23  Yes [provider]  acetaminophen  (TYLENOL ) 325 MG tablet Take 2 tablets (650 mg total) by mouth every 6 (six) hours as needed for mild pain (or Fever >/= 101). Patient not taking: Reported on 11/10/2023 12/30/21   Samtani, Jai-Gurmukh, MD  carvedilol  (COREG ) 25 MG tablet Take 1 tablet (25 mg total) by mouth 2 (two) times daily with a meal. Patient not taking: Reported on 11/10/2023 01/18/23 10/26/23  Iva Mariner, MD  cloNIDine  (CATAPRES ) 0.1 MG tablet Take 1 tablet (0.1 mg total) by mouth 2 (two) times daily. 01/18/23   Iva Mariner, MD  diphenhydramine -acetaminophen  (TYLENOL  PM) 25-500 MG TABS tablet Take 2 tablets by mouth 2 (two) times daily as needed (pain, sleep).    [provider]  insulin  glargine-yfgn (SEMGLEE ) 100 UNIT/ML injection Inject 0.12 mLs (12 Units total) into the skin at bedtime. 11/07/23   Senaida Dama, NP  insulin  lispro (HUMALOG ) 100 UNIT/ML injection Inject 0.01-0.07 mLs (1-7 Units total) into the  skin See admin instructions. Per sliding scale 3 times daily with meals 151-200= 1 unit 201-250= 2 units 251-300= 3 units 301-350= 5 units 351-400= 7 units Greater than 400 call md takes only as needed if blood sugar is over 200 11/07/23   Senaida Dama, NP  Multiple Vitamins-Minerals (MULTIVITAMIN WITH MINERALS) tablet Take 1 tablet by mouth daily.    [provider]  pantoprazole  (PROTONIX ) 40 MG tablet TAKE 1 TABLET (40 MG TOTAL) BY MOUTH 2 (TWO) TIMES DAILY. 11/03/23 11/02/24  Delman Ferns, NP  sodium zirconium cyclosilicate  (LOKELMA ) 10 g PACK packet Take 10 g by mouth See admin instructions. Take 1 packet (10g) once daily on non-dialysis days (Sunday, Monday, Wednesday, Friday)    [provider]  sucralfate  (CARAFATE ) 1 GM/10ML suspension Take 10 mLs (1 g total) by mouth 4 (four) times daily. 10/26/23   Delman Ferns, NP    Physical Exam: Vitals:   11/09/23 2315 11/10/23 0000  11/10/23 0015 11/10/23 0100  BP: (!) 170/114 (!) 155/98 (!) 161/107 (!) 154/96  Pulse: (!) 104  (!) 101   Resp: 20  20 15   Temp:  98.6 F (37 C)    SpO2: 93%  90% 94%  Weight:      Height:        Constitutional: NAD, calm  Eyes: PERTLA, lids and conjunctivae normal ENMT: Mucous membranes are moist. Posterior pharynx clear of any exudate or lesions.   Neck: supple, no masses  Respiratory: no wheezing, no crackles. No accessory muscle use.  Cardiovascular: S1 & S2 heard, regular rate and rhythm. No extremity edema.   Abdomen: Soft, no tenderness. Bowel sounds active.  Musculoskeletal: no clubbing / cyanosis. No joint deformity upper and lower extremities.   Skin: no significant rashes, lesions, ulcers. Warm, dry, well-perfused. Neurologic: CN 2-12 grossly intact aside from gross visual acuity deficit. Moving all extremities. Alert and oriented.  Psychiatric: Pleasant. Cooperative.    Labs and Imaging on Admission: I have personally reviewed following labs and imaging studies  CBC: Recent  Labs  Lab 11/09/23 2017  WBC 8.1  HGB 12.9*  HCT 40.5  MCV 84.7  PLT 260   Basic Metabolic Panel: Recent Labs  Lab 11/09/23 2017  NA 135  K 5.0  CL 90*  CO2 18*  GLUCOSE 179*  BUN 62*  CREATININE 15.35*  CALCIUM  8.0*   GFR: Estimated Creatinine Clearance: 7.2 mL/min (A) (by C-G formula based on SCr of 15.35 mg/dL (H)). Liver Function Tests: Recent Labs  Lab 11/09/23 2017  AST 20  ALT 15  ALKPHOS 62  BILITOT 0.7  PROT 9.0*  ALBUMIN 4.3   Recent Labs  Lab 11/09/23 2017  LIPASE 27   No results for input(s): "AMMONIA" in the last 168 hours. Coagulation Profile: No results for input(s): "INR", "PROTIME" in the last 168 hours. Cardiac Enzymes: No results for input(s): "CKTOTAL", "CKMB", "CKMBINDEX", "TROPONINI" in the last 168 hours. BNP (last 3 results) No results for input(s): "PROBNP" in the last 8760 hours. HbA1C: No results for input(s): "HGBA1C" in the last 72 hours. CBG: No results for input(s): "GLUCAP" in the last 168 hours. Lipid Profile: No results for input(s): "CHOL", "HDL", "LDLCALC", "TRIG", "CHOLHDL", "LDLDIRECT" in the last 72 hours. Thyroid Function Tests: No results for input(s): "TSH", "T4TOTAL", "FREET4", "T3FREE", "THYROIDAB" in the last 72 hours. Anemia Panel: No results for input(s): "VITAMINB12", "FOLATE", "FERRITIN", "TIBC", "IRON", "RETICCTPCT" in the last 72 hours. Urine analysis:    Component Value Date/Time   COLORURINE YELLOW 11/25/2020 0301   APPEARANCEUR CLEAR 11/25/2020 0301   LABSPEC 1.016 11/25/2020 0301   PHURINE 7.0 11/25/2020 0301   GLUCOSEU 50 (A) 11/25/2020 0301   HGBUR SMALL (A) 11/25/2020 0301   BILIRUBINUR NEGATIVE 11/25/2020 0301   KETONESUR NEGATIVE 11/25/2020 0301   PROTEINUR >=300 (A) 11/25/2020 0301   UROBILINOGEN 0.2 07/16/2014 1747   NITRITE NEGATIVE 11/25/2020 0301   LEUKOCYTESUR NEGATIVE 11/25/2020 0301   Sepsis Labs: @LABRCNTIP (procalcitonin:4,lacticidven:4) ) Recent Results (from the past 240  hours)  COVID-19, Flu A+B and RSV     Status: None   Collection Time: 11/07/23 11:06 AM   Specimen: Nasopharyngeal(NP) swabs in vial transport medium   Nasopharynge  Resident  Result Value Ref Range Status   SARS-CoV-2, NAA Not Detected Not Detected Final   Influenza A, NAA Not Detected Not Detected Final   Influenza B, NAA Not Detected Not Detected Final   RSV, NAA Not Detected Not Detected Final  Test Information: Comment  Final    Comment: This nucleic acid amplification test was developed and its performance characteristics determined by [employer]. Nucleic acid amplification tests include RT-PCR and TMA. This test has not been FDA cleared or approved. This test has been authorized by FDA under an Emergency Use Authorization (EUA). This test is only authorized for the duration of time the declaration that circumstances exist justifying the authorization of the emergency use of in vitro diagnostic tests for detection of SARS-CoV-2 virus and/or diagnosis of COVID-19 infection under section 564(b)(1) of the Act, 21 U.S.C. 696EXB-2(W) (1), unless the authorization is terminated or revoked sooner. When diagnostic testing is negative, the possibility of a false negative result should be considered in the context of a patient's recent exposures and the presence of clinical signs and symptoms consistent with COVID-19. An individual without symptoms of COVID-19 and who is not shedding SARS-CoV-2 virus wo uld expect to have a negative (not detected) result in this assay.      Radiological Exams on Admission: CT ABDOMEN PELVIS W CONTRAST Result Date: 11/09/2023 CLINICAL DATA:  Abdominal pain EXAM: CT ABDOMEN AND PELVIS WITH CONTRAST TECHNIQUE: Multidetector CT imaging of the abdomen and pelvis was performed using the standard protocol following bolus administration of intravenous contrast. RADIATION DOSE REDUCTION: This exam was performed according to the departmental  dose-optimization program which includes automated exposure control, adjustment of the mA and/or kV according to patient size and/or use of iterative reconstruction technique. CONTRAST:  75mL OMNIPAQUE  IOHEXOL  350 MG/ML SOLN COMPARISON:  08/04/2023 FINDINGS: Lower chest: Dependent ground-glass consolidation within the left lower lobe may be inflammatory, infectious, or hypoventilatory. Stable cardiomegaly. Hepatobiliary: No focal liver abnormality is seen. No gallstones, gallbladder wall thickening, or biliary dilatation. Pancreas: Unremarkable. No pancreatic ductal dilatation or surrounding inflammatory changes. Spleen: Normal in size without focal abnormality. Adrenals/Urinary Tract: Horseshoe configuration of the kidney. Punctate less than 2 mm nonobstructing calculi within the left moiety. The adrenals are unremarkable. The bladder is decompressed, limiting its evaluation. Stomach/Bowel: No bowel obstruction or ileus. Normal appendix right lower quadrant. There is diffuse colonic wall thickening most pronounced from the splenic flexure through the rectum, compatible with inflammatory or infectious colitis. Vascular/Lymphatic: No significant vascular findings are present. No enlarged abdominal or pelvic lymph nodes. Reproductive: Prostate is unremarkable. Other: No free fluid or free intraperitoneal gas. No abdominal wall hernia. Musculoskeletal: No acute or destructive bony abnormalities. Reconstructed images demonstrate no additional findings. IMPRESSION: 1. Mild diffuse colonic wall thickening greatest from the splenic flexure through the rectum, consistent with inflammatory or infectious colitis. 2. Dependent ground-glass attenuation within the left lower lobe, which may be hypoventilatory or related to inflammatory/infectious etiology. 3. 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 Electronically Signed   By: Bobbye Burrow M.D.   On: 11/09/2023 22:05    EKG:  Independently reviewed. Sinus tachycardia, rate 101, QTc 511.   Assessment/Plan   1. Intractable N/V; abdominal pain  - Vomit appeared blood-tinged per ED PA  - Bowel rest for now, supportive care, IV PPI, trend H&H, advance diet if improves, consider GI consultation if worsens or fails to improve    2. Hypertensive urgency  - Severely elevated BP without target-organ damage; improved with analgesics  - Continue labetalol  and clonidine  as tolerated, use parenteral agents if needed    3. ESRD  - Last dialyzed 4/29 per patient report  - Renally-dose medications, repeat chemistries in am, consult nephrology in am for maintenance HD  4. Insulin -dependent DM  - A1c was 6.0% in January 2023 - Check CBGs, continue management with insulin     5. Chronic HFpEF  - EF 55-60% with grade 3 diastolic dysfunction on echo from April 2024  - Appears compensated    DVT prophylaxis: SCDs Code Status: Full   Level of Care: Level of care: Progressive Family Communication: None present   Disposition Plan:  Patient is from: home  Anticipated d/c is to: Home  Anticipated d/c date is: TBD  Patient currently: Pending pain-control and tolerance of adequate oral intake Consults called: None  Admission status: Observation     Walton Guppy, MD [facility]  11/10/2023, 1:29 AM

## 2023-11-10 NOTE — TOC CM/SW Note (Signed)
 Transition of Care [facility]) - Inpatient Brief Assessment   Patient Details  Name: [name] MRN: [id number] Date of Birth: May 24, 1986  [facility] CM/SW Contact:    Jennett Model, RN Phone Number: 11/10/2023, 5:38 PM   Clinical Narrative: From home alone, indep, has PCP and insurance on file, states has no HH services in place at this time or DME at home.  States family member will transport them home at [employer] and family is support system, states gets medications from [facility].  Pta self ambulatory.  Patient does not give permission to speak to a family member.    Transition of Care Asessment: Insurance and Status: Insurance coverage has been reviewed Patient has primary care physician: Yes Home environment has been reviewed: home alone Prior level of function:: indep Prior/Current Home Services: No current home services Social Drivers of Health Review: SDOH reviewed no interventions necessary Readmission risk has been reviewed: Yes Transition of care needs: no transition of care needs at this time

## 2023-11-10 NOTE — ED Provider Notes (Signed)
  BP (!) 161/107   Pulse (!) 101   Temp 98 F (36.7 C)   Resp 20   Ht 6' (1.829 m)   Wt 90.8 kg   SpO2 90%   BMI 27.15 kg/m   Hand off from previous shift, [employer], please see her previous note for further detail.   Patient is still tachycardic. Still appears dry on examination. Limited with IVF given his ESRD. The patient reports that he is still having some pain. His emesis does appear slightly red tinged to me as well. Given his multiple comorbidities, physical presentation, and labs values, I do feel he needs gentle rehydration and observation. Dr. Brice Campi to admit.        Spence Dux, PA-C 11/10/23 0136    Ballard Bongo, MD 11/10/23 [phone]

## 2023-11-10 NOTE — Telephone Encounter (Signed)
Patient currently in hospital.

## 2023-11-10 NOTE — Care Management Obs Status (Signed)
 MEDICARE OBSERVATION STATUS NOTIFICATION   Patient Details  Name: [name] MRN: [id number] Date of Birth: 03-02-1986   Medicare Observation Status Notification Given:       Janith Melnick 11/10/2023, 3:00 PM

## 2023-11-10 NOTE — Progress Notes (Addendum)
 Same day note  [name] is a 38 y.o. male with medical history significant for insulin -dependent diabetes mellitus, hypertension, chronic diastolic CHF, blindness, and ESRD on hemodialysis, presented to hospital with abdominal pain nausea and vomiting with some loose stools which has improved since admission.  In the ED patient had elevated blood pressure.  CT showed mild inflammatory/infective colitis.    Patient was admitted to the hospital for nausea vomiting abdominal pain.  At the time of my evaluation, patient complains of feeling a little better with less nausea and no vomiting.  Mild abdominal discomfort.Wants to try Jell-O.  Physical examination reveals average built male, not in obvious distress.    Laboratory data and imaging was reviewed  Assessment and Plan.  Nausea vomiting abdominal pain.  CT scan showed some features of colitis.  Patient without fever or elevated WBC.  Will avoid antibiotic for now.  States that he did have esophagitis in January when he had similar symptoms.  Continue supportive care including hydration antiemetics analgesics.  IV Protonix  twice daily.  States that his diabetes under good control.  Will check hemoglobin A1c.  Diabetes mellitus type 2.  On Semglee  and sliding scale insulin  at this time.  Will continue to monitor closely.  Last known hemoglobin A1c was 6.0 in January 2023.  Latest hemoglobin A1c of 6.4.  End-stage renal disease on hemodialysis.  Nephrology has been notified for dialysis needs.  Tuesday Thursday Saturday schedule.  Accelerated hypertension.  Will continue to monitor closely.  Continue labetalol  and clonidine   Congestive heart failure with preserved ejection fraction.  EF of 55 to 60% from last year.  Appears compensated.   No Charge  Signed,  Lindwood Rhody, MD [facility]

## 2023-11-10 NOTE — Consult Note (Signed)
 Renal Service Consult Note [facility]  [name] 11/10/2023 Lynae Sandifer, MD Requesting Physician: Dr. Efrain Grant  Reason for Consult: ESRD pt w/ abdominal pain and vomiting HPI: The patient is a 38 y.o. year-old w/ PMH as below who presented to ED yesterday afternoon complaining of vomiting and abdominal pain for less than 1 day.  He also had some diarrhea.  No fevers.  Some URI symptoms.  His last dialysis was on Tuesday, schedule is TTS.  In the ED patient was afebrile, slightly elevated HR and severely elevated BP.  Labs showed BUN 62, normal WBC, Hgb 12.9.  CT of abdomen showed mild inflammatory or infectious colitis.  In the ED patient received 1 L normal saline, IV PPI and IV Reglan  and Zofran  and Dilaudid .  Patient was admitted and we are asked to see for dialysis   Pt seen in room. Not giving much history today. Denies SOB, cough or LE edema.    ROS - denies CP, no joint pain, no HA, no blurry vision, no rash  PMH: Diabetes type 2 ESRD on HD Hypercholesterolemia Hypertension History of seizures Hypertrophic cardiomyopathy  Past Surgical History  Past Surgical History:  Procedure Laterality Date   AMPUTATION TOE Right 12/29/2021   Procedure: AMPUTATION TOE, fifth;  Surgeon: Jennefer Moats, DPM;  Location: [facility] OR;  Service: Podiatry;  Laterality: Right;  surgical team will do local block   AV FISTULA PLACEMENT Left 08/11/2020   Procedure: LEFT UPPER EXTREMITY ARTERIOVENOUS (AV) FISTULA CREATION;  Surgeon: Carlene Che, MD;  Location: [facility] OR;  Service: Vascular;  Laterality: Left;   BIOPSY  08/04/2023   Procedure: BIOPSY;  Surgeon: Hargis Lias, MD;  Location: [facility] ENDO SUITE;  Service: Endoscopy;;   ESOPHAGOGASTRODUODENOSCOPY (EGD) WITH PROPOFOL  N/A 08/04/2023   Procedure: ESOPHAGOGASTRODUODENOSCOPY (EGD) WITH PROPOFOL ;  Surgeon: Hargis Lias, MD;  Location: [facility] END[facility] SUITE;  Service: Endoscopy;  Laterality: N/A;   FISTULA SUPERFICIALIZATION Left  12/02/2021   Procedure: PLICATION OF LEFT RADIUS CEPHALIC FISTULA;  Surgeon: Carlene Che, MD;  Location: [facility] OR;  Service: Vascular;  Laterality: Left;  PERIPHERAL NERVE BLOCK   FRACTURE SURGERY     I & D EXTREMITY Left 07/16/2014   Procedure: IRRIGATION AND DEBRIDEMENT EXTREMITY/LEFT INDEX FINGER;  Surgeon: Brunilda Capra, MD;  Location: [facility] OR;  Service: Orthopedics;  Laterality: Left;   IR FLUORO GUIDE CV LINE RIGHT  10/04/2022   IR FLUORO GUIDE CV LINE RIGHT  10/05/2022   IR PERC TUN PERIT CATH WO PORT S&I /IMAG  08/07/2020   IR REMOVAL TUN CV CATH W/O FL  10/27/2022   IR THROMBECTOMY AV FISTULA W/THROMBOLYSIS/PTA INC/SHUNT/IMG LEFT Left 10/05/2022   IR US  GUIDE VASC ACCESS RIGHT  08/07/2020   IR US  GUIDE VASC ACCESS RIGHT  10/04/2022   IR US  GUIDE VASC ACCESS RIGHT  10/05/2022   TEE WITHOUT CARDIOVERSION N/A 10/28/2022   Procedure: TRANSESOPHAGEAL ECHOCARDIOGRAM;  Surgeon: Jerryl Morin, DO;  Location: [facility] INVASIVE CV LAB;  Service: Cardiovascular;  Laterality: N/A;   Family History  Family History  Problem Relation Age of Onset   Cancer Mother    Stroke Father    Diabetes Father    Hypertension Father    Social History  reports that he has never smoked. He has never been exposed to tobacco smoke. He has never used smokeless tobacco. He reports that he does not currently use alcohol. He reports that he does not use drugs. Allergies  Allergies  Allergen Reactions  Apresoline  [Hydralazine ] Nausea And Vomiting and Other (See Comments)    Patient was taking Hydralazine  AND Amlodipine  at the same time, so the reactions came from one of the 2: Lethargy and an all-over feeling of NOT feeling well   Norvasc  [Amlodipine ] Nausea And Vomiting and Other (See Comments)    Patient was taking Amlodipine  AND Hydralazine  at the same time, so the reactions came from one of the 2: Lethargy and an all-over feeling of NOT feeling well    Home medications Prior to Admission medications   Medication Sig Start  Date End Date Taking? Authorizing Provider  atorvastatin  (LIPITOR) 40 MG tablet Take 1 tablet (40 mg total) by mouth daily. Patient taking differently: Take 40 mg by mouth daily after supper. 01/18/23 05/08/24 Yes Iva Mariner, MD  benzonatate  (TESSALON  PERLES) 100 MG capsule Take 1 capsule (100 mg total) by mouth 3 (three) times daily as needed. 11/07/23  Yes Rogerio Clay, Amy J, NP  cloNIDine  (CATAPRES ) 0.1 MG tablet Take 1 tablet (0.1 mg total) by mouth 2 (two) times daily. 01/18/23  Yes Iva Mariner, MD  diphenhydramine -acetaminophen  (TYLENOL  PM) 25-500 MG TABS tablet Take 2 tablets by mouth 2 (two) times daily as needed (pain, sleep).   Yes [provider]  insulin  glargine-yfgn (SEMGLEE ) 100 UNIT/ML injection Inject 0.12 mLs (12 Units total) into the skin at bedtime. Patient taking differently: Inject 7 Units into the skin at bedtime. 11/07/23  Yes Rogerio Clay, Amy J, NP  insulin  lispro (HUMALOG ) 100 UNIT/ML KwikPen Inject 1-7 Units into the skin 3 (three) times daily. Per sliding scale 3 times daily with meals 151-200= 1 unit 201-250= 2 units 251-300= 3 units 301-350= 5 units 351-400= 7 units Greater than 400 call md takes only as needed if blood sugar is over 200 10/24/23  Yes [provider]  labetalol  (NORMODYNE ) 200 MG tablet Take 200 mg by mouth 2 (two) times daily. 10/24/23  Yes [provider]  metoCLOPramide  (REGLAN ) 5 MG tablet Take 5 mg by mouth 4 (four) times daily -  before meals and at bedtime. 08/02/23  Yes [provider]  minoxidil (LONITEN) 2.5 MG tablet Take 2.5 mg by mouth 2 (two) times daily. 10/24/23  Yes [provider]  Multiple Vitamins-Minerals (MULTIVITAMIN WITH MINERALS) tablet Take 1 tablet by mouth daily.   Yes [provider]  pantoprazole  (PROTONIX ) 40 MG tablet TAKE 1 TABLET (40 MG TOTAL) BY MOUTH 2 (TWO) TIMES DAILY. 11/03/23 11/02/24 Yes Delman Ferns, NP  sodium zirconium cyclosilicate  (LOKELMA ) 10 g PACK packet Take 10 g by  mouth See admin instructions. Take 1 packet (10g) once daily on non-dialysis days (Sunday, Monday, Wednesday, Friday)   Yes [provider]  sucralfate  (CARAFATE ) 1 GM/10ML suspension Take 10 mLs (1 g total) by mouth 4 (four) times daily. 10/26/23  Yes Delman Ferns, NP     Vitals:   11/10/23 0316 11/10/23 0400 11/10/23 0900 11/10/23 1024  BP: 131/85 127/88 (!) 141/93 (!) 157/108  Pulse: 87  83 97  Resp:   18   Temp:   98.5 F (36.9 C)   TempSrc:   Oral   SpO2: 92%  93%   Weight:      Height:       Exam Gen alert, no distress, on room air No rash, cyanosis or gangrene Sclera anicteric, throat clear  No jvd or bruits Chest clear bilat to bases, no rales/ wheezing RRR no MRG Abd soft ntnd no mass or ascites +bs GU nl  male MS no joint effusions or deformity Ext no LE or UE edema, no other edema Neuro is alert, Ox 3 , nf    LUE AVF+bruit     Renal-related home meds: Labetalol  200 twice daily Minoxidil 2.5 mg twice daily Lokelma  10 g on nondialysis days Clonidine  0.1 twice daily Others: Carafate , PPI, Reglan , insulin  lispro/glargine, statin    OP HD: TTS [facility] 4h  B450   88.5kg  LUE AVF  Heparin  500 bolus + 1000/hr    BUN 65 creat 15   K 5.1  Na 132   CO2 21   Hb 12.2     Assessment/ Plan: Abdominal pain/ N/V: per CT possibly has icolitis. Per pmd.  ESRD: on HD TTS. Last HD Tuesday. Plan HD today/ tonight.  HTNsive urgency: possibly due to not keeping BP meds down at home. Improved w/ pain medications here. Better today.  HTN: cont home meds, no vol overload Volume: euvolemic on exam, just under dry wt. Keep even on HD today.  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  Secondary hyperparathyroidism: CCa on the slow side, get phos, cont binders ac.       Larry Poag  MD [facility] 11/10/2023, 1:08 PM  Recent Labs  Lab 11/09/23 2017 11/10/23 0226  HGB 12.9* 12.2*  ALBUMIN 4.3  --   CALCIUM  8.0* 7.4*  CREATININE 15.35* 15.57*  K 5.0 5.1   Inpatient  medications:  cloNIDine   0.1 mg Oral BID   insulin  aspart  0-6 Units Subcutaneous Q4H   insulin  glargine-yfgn  5 Units Subcutaneous QHS   labetalol   100 mg Oral BID   pantoprazole  (PROTONIX ) IV  40 mg Intravenous Q12H   sodium chloride  flush  3 mL Intravenous Q12H    acetaminophen  **OR** acetaminophen , HYDROmorphone  (DILAUDID ) injection, labetalol , oxyCODONE , trimethobenzamide 

## 2023-11-10 NOTE — Care Management Important Message (Signed)
 Important Message  Patient Details  Name: [name] MRN: [id number] Date of Birth: December 12, 1985   Important Message Given:        Janith Melnick 11/10/2023, 3:00 PM

## 2023-11-10 NOTE — Progress Notes (Signed)
 Pt receives out-pt HD at [facility] on TTS around 10:00 am chair time. Will assist as needed.   Lauraine Polite Renal Navigator [phone]

## 2023-11-11 DIAGNOSIS — Z794 Long term (current) use of insulin: Secondary | ICD-10-CM | POA: Diagnosis not present

## 2023-11-11 DIAGNOSIS — Z992 Dependence on renal dialysis: Secondary | ICD-10-CM | POA: Diagnosis not present

## 2023-11-11 DIAGNOSIS — A09 Infectious gastroenteritis and colitis, unspecified: Secondary | ICD-10-CM | POA: Diagnosis present

## 2023-11-11 DIAGNOSIS — R9431 Abnormal electrocardiogram [ECG] [EKG]: Secondary | ICD-10-CM | POA: Diagnosis present

## 2023-11-11 DIAGNOSIS — N186 End stage renal disease: Secondary | ICD-10-CM | POA: Diagnosis present

## 2023-11-11 DIAGNOSIS — I132 Hypertensive heart and chronic kidney disease with heart failure and with stage 5 chronic kidney disease, or end stage renal disease: Secondary | ICD-10-CM | POA: Diagnosis present

## 2023-11-11 DIAGNOSIS — Z8249 Family history of ischemic heart disease and other diseases of the circulatory system: Secondary | ICD-10-CM | POA: Diagnosis not present

## 2023-11-11 DIAGNOSIS — Z823 Family history of stroke: Secondary | ICD-10-CM | POA: Diagnosis not present

## 2023-11-11 DIAGNOSIS — D631 Anemia in chronic kidney disease: Secondary | ICD-10-CM | POA: Diagnosis present

## 2023-11-11 DIAGNOSIS — K209 Esophagitis, unspecified without bleeding: Secondary | ICD-10-CM | POA: Diagnosis present

## 2023-11-11 DIAGNOSIS — I5032 Chronic diastolic (congestive) heart failure: Secondary | ICD-10-CM | POA: Diagnosis present

## 2023-11-11 DIAGNOSIS — E1122 Type 2 diabetes mellitus with diabetic chronic kidney disease: Secondary | ICD-10-CM | POA: Diagnosis present

## 2023-11-11 DIAGNOSIS — K529 Noninfective gastroenteritis and colitis, unspecified: Secondary | ICD-10-CM | POA: Diagnosis present

## 2023-11-11 DIAGNOSIS — N2581 Secondary hyperparathyroidism of renal origin: Secondary | ICD-10-CM | POA: Diagnosis present

## 2023-11-11 DIAGNOSIS — I160 Hypertensive urgency: Secondary | ICD-10-CM | POA: Diagnosis present

## 2023-11-11 DIAGNOSIS — R112 Nausea with vomiting, unspecified: Secondary | ICD-10-CM | POA: Diagnosis not present

## 2023-11-11 DIAGNOSIS — Z888 Allergy status to other drugs, medicaments and biological substances status: Secondary | ICD-10-CM | POA: Diagnosis not present

## 2023-11-11 DIAGNOSIS — H547 Unspecified visual loss: Secondary | ICD-10-CM | POA: Diagnosis present

## 2023-11-11 DIAGNOSIS — Z89421 Acquired absence of other right toe(s): Secondary | ICD-10-CM | POA: Diagnosis not present

## 2023-11-11 DIAGNOSIS — Z79899 Other long term (current) drug therapy: Secondary | ICD-10-CM | POA: Diagnosis not present

## 2023-11-11 DIAGNOSIS — E7800 Pure hypercholesterolemia, unspecified: Secondary | ICD-10-CM | POA: Diagnosis present

## 2023-11-11 DIAGNOSIS — Z833 Family history of diabetes mellitus: Secondary | ICD-10-CM | POA: Diagnosis not present

## 2023-11-11 LAB — GLUCOSE, CAPILLARY
Glucose-Capillary: 96 mg/dL (ref 70–99)
Glucose-Capillary: 129 mg/dL — ABNORMAL HIGH (ref 70–99)
Glucose-Capillary: 136 mg/dL — ABNORMAL HIGH (ref 70–99)
Glucose-Capillary: 118 mg/dL — ABNORMAL HIGH (ref 70–99)
Glucose-Capillary: 130 mg/dL — ABNORMAL HIGH (ref 70–99)

## 2023-11-11 LAB — CBC
HCT: 34.7 % — ABNORMAL LOW (ref 39.0–52.0)
Hemoglobin: 11.2 g/dL — ABNORMAL LOW (ref 13.0–17.0)
MCH: 27.1 pg (ref 26.0–34.0)
MCHC: 32.3 g/dL (ref 30.0–36.0)
MCV: 84 fL (ref 80.0–100.0)
Platelets: 220 10*3/uL (ref 150–400)
RBC: 4.13 MIL/uL — ABNORMAL LOW (ref 4.22–5.81)
RDW: 16.3 % — ABNORMAL HIGH (ref 11.5–15.5)
WBC: 7.5 10*3/uL (ref 4.0–10.5)
nRBC: 0 % (ref 0.0–0.2)

## 2023-11-11 LAB — MAGNESIUM: Magnesium: 2.1 mg/dL (ref 1.7–2.4)

## 2023-11-11 LAB — HEPATITIS B SURFACE ANTIBODY, QUANTITATIVE: Hep B S AB Quant (Post): 7915 m[IU]/mL

## 2023-11-11 LAB — BASIC METABOLIC PANEL WITH GFR
Anion gap: 17 — ABNORMAL HIGH (ref 5–15)
BUN: 38 mg/dL — ABNORMAL HIGH (ref 6–20)
CO2: 24 mmol/L (ref 22–32)
Calcium: 8 mg/dL — ABNORMAL LOW (ref 8.9–10.3)
Chloride: 90 mmol/L — ABNORMAL LOW (ref 98–111)
Creatinine, Ser: 9.78 mg/dL — ABNORMAL HIGH (ref 0.61–1.24)
GFR, Estimated: 6 mL/min — ABNORMAL LOW (ref >60)
Glucose, Bld: 90 mg/dL (ref 70–99)
Potassium: 3.3 mmol/L — ABNORMAL LOW (ref 3.5–5.1)
Sodium: 131 mmol/L — ABNORMAL LOW (ref 135–145)

## 2023-11-11 MED ORDER — METOCLOPRAMIDE HCL 5 MG/ML IJ SOLN
5.0000 mg | Freq: Once | INTRAMUSCULAR | Status: AC
  Administered 2023-11-11: 5 mg via INTRAVENOUS
  Filled 2023-11-11: qty 2

## 2023-11-11 MED ORDER — CHLORHEXIDINE GLUCONATE CLOTH 2 % EX PADS
6.0000 | MEDICATED_PAD | Freq: Every day | CUTANEOUS | Status: DC
  Administered 2023-11-13: 6 via TOPICAL

## 2023-11-11 MED ORDER — POTASSIUM CHLORIDE CRYS ER 20 MEQ PO TBCR
40.0000 meq | EXTENDED_RELEASE_TABLET | Freq: Once | ORAL | Status: AC
  Administered 2023-11-11: 40 meq via ORAL
  Filled 2023-11-11: qty 2

## 2023-11-11 NOTE — Plan of Care (Signed)
  Problem: Education: Goal: Knowledge of General Education information will improve Description: Including pain rating scale, medication(s)/side effects and non-pharmacologic comfort measures Outcome: Progressing   Problem: Clinical Measurements: Goal: Will remain free from infection Outcome: Progressing Goal: Respiratory complications will improve Outcome: Progressing   Problem: Skin Integrity: Goal: Risk for impaired skin integrity will decrease Outcome: Not Progressing   Problem: Clinical Measurements: Goal: Ability to maintain clinical measurements within normal limits will improve Outcome: Not Progressing   Problem: Elimination: Goal: Will not experience complications related to urinary retention Outcome: Not Progressing   Problem: Pain Managment: Goal: General experience of comfort will improve and/or be controlled Outcome: Not Progressing

## 2023-11-11 NOTE — Progress Notes (Signed)
 [facility] Progress Note   Subjective:   Complaining of nausea and abdominal pain today. Reports pain was worse after dialysis. Denies shortness of breath.   Objective Vitals:   11/11/23 0412 11/11/23 0523 11/11/23 0734 11/11/23 0925  BP: 135/87 (!) 161/111 (!) 158/91   Pulse: 95  (!) 101   Resp: (!) 25 16 20    Temp: 99 F (37.2 C) 99.2 F (37.3 C) 98.6 F (37 C) (!) 101.1 F (38.4 C)  TempSrc: Oral Oral Oral   SpO2: 94% 93% 91%   Weight:      Height:       Physical Exam General: Alert male, appears uncomfortable Heart: RRR, no murmurs, rubs or gallops Lungs: CTA bilaterally, respirations unlabored Abdomen: Non-distended, palpation deferred  Extremities: No edema b/l lower extremities Dialysis Access: LUE AVF + t/b  Additional Objective Labs: Basic Metabolic Panel: Recent Labs  Lab 11/09/23 2017 11/10/23 0226 11/11/23 0244  NA 135 132* 131*  K 5.0 5.1 3.3*  CL 90* 92* 90*  CO2 18* 21* 24  GLUCOSE 179* 155* 90  BUN 62* 65* 38*  CREATININE 15.35* 15.57* 9.78*  CALCIUM  8.0* 7.4* 8.0*  PHOS  --  6.8*  --    Liver Function Tests: Recent Labs  Lab 11/09/23 2017  AST 20  ALT 15  ALKPHOS 62  BILITOT 0.7  PROT 9.0*  ALBUMIN 4.3   Recent Labs  Lab 11/09/23 2017  LIPASE 27   CBC: Recent Labs  Lab 11/09/23 2017 11/10/23 0226 11/11/23 0244  WBC 8.1 7.5 7.5  HGB 12.9* 12.2* 11.2*  HCT 40.5 37.7* 34.7*  MCV 84.7 83.8 84.0  PLT 260 252 220   Blood Culture    Component Value Date/Time   SDES BLOOD RIGHT HAND 05/22/2023 1146   SPECREQUEST  05/22/2023 1146    BOTTLES DRAWN AEROBIC AND ANAEROBIC Blood Culture results may not be optimal due to an excessive volume of blood received in culture bottles   CULT  05/22/2023 1146    NO GROWTH 5 DAYS Performed at [facility], [address] [place], [place] [address]    REPTSTATUS 05/27/2023 FINAL 05/22/2023 1146    Cardiac Enzymes: No results for input(s): "CKTOTAL", "CKMB",  "CKMBINDEX", "TROPONINI" in the last 168 hours. CBG: Recent Labs  Lab 11/10/23 1155 11/10/23 1645 11/10/23 2001 11/11/23 0545 11/11/23 0733  GLUCAP 131* 108* 152* 96 129*   Iron Studies: No results for input(s): "IRON", "TIBC", "TRANSFERRIN", "FERRITIN" in the last 72 hours. @lablastinr3 @ Studies/Results: CT ABDOMEN PELVIS W CONTRAST Result Date: 11/09/2023 CLINICAL DATA:  Abdominal pain EXAM: CT ABDOMEN AND PELVIS WITH CONTRAST TECHNIQUE: Multidetector CT imaging of the abdomen and pelvis was performed using the standard protocol following bolus administration of intravenous contrast. RADIATION DOSE REDUCTION: This exam was performed according to the departmental dose-optimization program which includes automated exposure control, adjustment of the mA and/or kV according to patient size and/or use of iterative reconstruction technique. CONTRAST:  75mL OMNIPAQUE  IOHEXOL  350 MG/ML SOLN COMPARISON:  08/04/2023 FINDINGS: Lower chest: Dependent ground-glass consolidation within the left lower lobe may be inflammatory, infectious, or hypoventilatory. Stable cardiomegaly. Hepatobiliary: No focal liver abnormality is seen. No gallstones, gallbladder wall thickening, or biliary dilatation. Pancreas: Unremarkable. No pancreatic ductal dilatation or surrounding inflammatory changes. Spleen: Normal in size without focal abnormality. Adrenals/Urinary Tract: Horseshoe configuration of the kidney. Punctate less than 2 mm nonobstructing calculi within the left moiety. The adrenals are unremarkable. The bladder is decompressed, limiting its evaluation. Stomach/Bowel: No  bowel obstruction or ileus. Normal appendix right lower quadrant. There is diffuse colonic wall thickening most pronounced from the splenic flexure through the rectum, compatible with inflammatory or infectious colitis. Vascular/Lymphatic: No significant vascular findings are present. No enlarged abdominal or pelvic lymph nodes. Reproductive: Prostate  is unremarkable. Other: No free fluid or free intraperitoneal gas. No abdominal wall hernia. Musculoskeletal: No acute or destructive bony abnormalities. Reconstructed images demonstrate no additional findings. IMPRESSION: 1. Mild diffuse colonic wall thickening greatest from the splenic flexure through the rectum, consistent with inflammatory or infectious colitis. 2. Dependent ground-glass attenuation within the left lower lobe, which may be hypoventilatory or related to inflammatory/infectious etiology. 3. 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 Electronically Signed   By: Bobbye Burrow M.D.   On: 11/09/2023 22:05   Medications:   Chlorhexidine  Gluconate Cloth  6 each Topical Q0600   cloNIDine   0.1 mg Oral BID   insulin  aspart  0-6 Units Subcutaneous Q4H   insulin  glargine-yfgn  5 Units Subcutaneous QHS   labetalol   100 mg Oral BID   pantoprazole  (PROTONIX ) IV  40 mg Intravenous Q12H   sodium chloride  flush  3 mL Intravenous Q12H    Dialysis Orders: TTS [facility] 4h  B450   88.5kg  LUE AVF  Heparin  500 bolus + 1000/hr     BUN 65 creat 15   K 5.1  Na 132   CO2 21   Hb 12.2     Assessment/Plan: Abdominal pain/ N/V: per CT possibly has icolitis. Management per primary team ESRD: on HD TTS. Last HD Tuesday, continue TTS schedule HTNsive urgency: possibly due to not keeping BP meds down at home. Improved but still elevated, pain may be contributing.  HTN: cont home meds, no vol overload on exam Volume: euvolemic on exam, just under dry wt with minimal PO intake. No UF with HD tomorrow Anemia of esrd: Hb 11.2, no ESA indicated at this time.  Secondary hyperparathyroidism: Corrected calcium  at goal, phos elevated. Resume binders once tolerating PO  [name], PA-C 11/11/2023, 10:19 AM  [facility] Pager: [phone]

## 2023-11-11 NOTE — Progress Notes (Signed)
 Received patient in bed to unit.  Alert and oriented.  Informed consent signed and in chart. TX duration: 3hr    Patient tolerated well.  Transported back to the room Alert, without acute distress.  Hand-off given to Charge Nurse Leeland Pugh  Access used: AVF  Access issues: NO  Total UF removed:  Medication(s) given: Tylenol  650 mg tab PO  Post HD VS: 135/87 Post HD weight: unable to obtain      11/11/23 0412  Vitals  Temp 99 F (37.2 C)  Temp Source Oral  BP 135/87  MAP (mmHg) 98  BP Location Right Arm  BP Method Automatic  Patient Position (if appropriate) Lying  Pulse Rate 95  Pulse Rate Source Monitor  ECG Heart Rate 95  Resp (!) 25  Oxygen  Therapy  SpO2 94 %  O2 Device Room Air  During Treatment Monitoring  Blood Flow Rate (mL/min) 0 mL/min  Arterial Pressure (mmHg) -0.8 mmHg  Venous Pressure (mmHg) -1.61 mmHg  TMP (mmHg) 25.05 mmHg  Ultrafiltration Rate (mL/min) 1306 mL/min  Dialysate Flow Rate (mL/min) 300 ml/min  Duration of HD Treatment -hour(s) 3.5 hour(s)  Cumulative Fluid Removed (mL) per Treatment  2000.16  Intra-Hemodialysis Comments Tx completed;Tolerated well;See progress note  Post Treatment  Dialyzer Clearance Lightly streaked  Hemodialysis Intake (mL) 0 mL  Liters Processed 81.8  Fluid Removed (mL) 2000 mL  Tolerated HD Treatment Yes  Post-Hemodialysis Comments alert, conscious and coherent axo x4  AVG/AVF Arterial Site Held (minutes) 10 minutes  AVG/AVF Venous Site Held (minutes) 10 minutes  Fistula / Graft Left Forearm Arteriovenous fistula  Placement Date/Time: 08/11/20 0839   Orientation: Left  Access Location: Forearm  Access Type: Arteriovenous fistula  Site Condition No complications  Fistula / Graft Assessment Present;Thrill;Bruit;Aneurysm present  Status Deaccessed  Drainage Description None     Staci Dykes, BSN, RN Kidney Dialysis Unit

## 2023-11-11 NOTE — Progress Notes (Signed)
 PROGRESS NOTE  [name]:[id number] DOB: [birth year] DOA: 11/09/2023 PCP: Senaida Dama, NP   LOS: 0 days   Brief narrative:  [name] is a 38 y.o. male with medical history significant for insulin -dependent diabetes mellitus, hypertension, chronic diastolic CHF, blindness, and ESRD on hemodialysis, presented to hospital with abdominal pain nausea and vomiting with some loose stools which has improved since admission.  In the ED patient had elevated blood pressure.  CT showed mild inflammatory/infective colitis.    Assessment/Plan: Principal Problem:   Intractable nausea and vomiting Active Problems:   Hypertensive urgency   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)   ESRD (end stage renal disease) on dialysis (HCC)   Chronic diastolic CHF (congestive heart failure) (HCC)   Prolonged QT interval    Nausea vomiting abdominal pain.   CT scan showed some features of colitis.  Patient without fever or elevated WBC.  Will avoid antibiotic for now.  States that he did have esophagitis in January when he had similar symptoms and had EGD done at that time..  Continue supportive care including hydration antiemetics analgesics.  IV Protonix  twice daily.  States that his diabetes under good control.  Hemoglobin A1c of 6.4.  Will continue on clears only for now.  Patient still complains of abdominal pain and vomiting.  Will give 1 dose of IV Reglan  for now.   Diabetes mellitus type 2.  On Semglee  and sliding scale insulin  at this time.  Will continue to monitor closely.  Last known hemoglobin A1c was 6.0 in January 2023.  Latest hemoglobin A1c of 6.4.  Glycemic control adequate at this time.  On clears.   End-stage renal disease on hemodialysis.  Nephrology has been notified for dialysis needs.  Tuesday Thursday Saturday schedule.   Accelerated hypertension.  Will continue to monitor closely.  Continue labetalol  and clonidine    Congestive heart failure  with preserved ejection fraction.  EF of 55 to 60% from last year.  Appears compensated.  DVT prophylaxis: SCDs Start: 11/10/23 0118   Disposition: Home likely in 1 to 2 days  Status is: Observation The patient will require care spanning > 2 midnights and should be moved to inpatient because: Intractable vomiting and nausea, pending clinical improvement,    Code Status:     Code Status: Full Code  Family Communication: None at bedside  Consultants: Nephrology  Procedures: Hemodialysis  Anti-infectives:  None  Anti-infectives (From admission, onward)    None        Subjective: Today, patient was seen and examined at bedside.  Still complains of abdominal pain nausea and vomiting.  On clears.  Objective: Vitals:   11/11/23 0734 11/11/23 0925  BP: (!) 158/91   Pulse: (!) 101   Resp: 20   Temp: 98.6 F (37 C) (!) 101.1 F (38.4 C)  SpO2: 91%     Intake/Output Summary (Last 24 hours) at 11/11/2023 0954 Last data filed at 11/11/2023 0412 Gross per 24 hour  Intake --  Output 2000 ml  Net -2000 ml   Filed Weights   11/09/23 2012 11/10/23 0214  Weight: 90.8 kg 88 kg   Body mass index is 26.31 kg/m.   Physical Exam: GENERAL: Patient is alert awake and oriented appears to be in mild distress due to abdominal discomfort and pain. HENT: No scleral pallor or icterus.  Blindness. Oral mucosa is moist NECK: is supple, no gross swelling noted. CHEST: Clear to auscultation. No crackles or wheezes.  Diminished breath sounds bilaterally. CVS: S1 and S2 heard, no murmur. Regular rate and rhythm.  ABDOMEN: Soft, tenderness over the general abdomen.  Bowel sounds present.   EXTREMITIES: No edema. CNS: Cranial nerves are intact. No focal motor deficits. SKIN: warm and dry without rashes.  Skin with darker pigmentation.  Data Review: I have personally reviewed the following laboratory data and studies,  CBC: Recent Labs  Lab 11/09/23 2017 11/10/23 0226  11/11/23 0244  WBC 8.1 7.5 7.5  HGB 12.9* 12.2* 11.2*  HCT 40.5 37.7* 34.7*  MCV 84.7 83.8 84.0  PLT 260 252 220   Basic Metabolic Panel: Recent Labs  Lab 11/09/23 2017 11/10/23 0226 11/11/23 0244  NA 135 132* 131*  K 5.0 5.1 3.3*  CL 90* 92* 90*  CO2 18* 21* 24  GLUCOSE 179* 155* 90  BUN 62* 65* 38*  CREATININE 15.35* 15.57* 9.78*  CALCIUM  8.0* 7.4* 8.0*  MG  --  2.2 2.1  PHOS  --  6.8*  --    Liver Function Tests: Recent Labs  Lab 11/09/23 2017  AST 20  ALT 15  ALKPHOS 62  BILITOT 0.7  PROT 9.0*  ALBUMIN 4.3   Recent Labs  Lab 11/09/23 2017  LIPASE 27   No results for input(s): "AMMONIA" in the last 168 hours. Cardiac Enzymes: No results for input(s): "CKTOTAL", "CKMB", "CKMBINDEX", "TROPONINI" in the last 168 hours. BNP (last 3 results) Recent Labs    08/02/23 1120  BNP 3,974.0*    ProBNP (last 3 results) No results for input(s): "PROBNP" in the last 8760 hours.  CBG: Recent Labs  Lab 11/10/23 1155 11/10/23 1645 11/10/23 2001 11/11/23 0545 11/11/23 0733  GLUCAP 131* 108* 152* 96 129*   Recent Results (from the past 240 hours)  COVID-19, Flu A+B and RSV     Status: None   Collection Time: 11/07/23 11:06 AM   Specimen: Nasopharyngeal(NP) swabs in vial transport medium   Nasopharynge  Resident  Result Value Ref Range Status   SARS-CoV-2, NAA Not Detected Not Detected Final   Influenza A, NAA Not Detected Not Detected Final   Influenza B, NAA Not Detected Not Detected Final   RSV, NAA Not Detected Not Detected Final   Test Information: Comment  Final    Comment: This nucleic acid amplification test was developed and its performance characteristics determined by [employer]. Nucleic acid amplification tests include RT-PCR and TMA. This test has not been FDA cleared or approved. This test has been authorized by FDA under an Emergency Use Authorization (EUA). This test is only authorized for the duration of time the declaration that  circumstances exist justifying the authorization of the emergency use of in vitro diagnostic tests for detection of SARS-CoV-2 virus and/or diagnosis of COVID-19 infection under section 564(b)(1) of the Act, 21 U.S.C. 161WRU-0(A) (1), unless the authorization is terminated or revoked sooner. When diagnostic testing is negative, the possibility of a false negative result should be considered in the context of a patient's recent exposures and the presence of clinical signs and symptoms consistent with COVID-19. An individual without symptoms of COVID-19 and who is not shedding SARS-CoV-2 virus wo uld expect to have a negative (not detected) result in this assay.      Studies: CT ABDOMEN PELVIS W CONTRAST Result Date: 11/09/2023 CLINICAL DATA:  Abdominal pain EXAM: CT ABDOMEN AND PELVIS WITH CONTRAST TECHNIQUE: Multidetector CT imaging of the abdomen and pelvis was performed using the standard protocol following bolus administration of intravenous contrast.  RADIATION DOSE REDUCTION: This exam was performed according to the departmental dose-optimization program which includes automated exposure control, adjustment of the mA and/or kV according to patient size and/or use of iterative reconstruction technique. CONTRAST:  75mL OMNIPAQUE  IOHEXOL  350 MG/ML SOLN COMPARISON:  08/04/2023 FINDINGS: Lower chest: Dependent ground-glass consolidation within the left lower lobe may be inflammatory, infectious, or hypoventilatory. Stable cardiomegaly. Hepatobiliary: No focal liver abnormality is seen. No gallstones, gallbladder wall thickening, or biliary dilatation. Pancreas: Unremarkable. No pancreatic ductal dilatation or surrounding inflammatory changes. Spleen: Normal in size without focal abnormality. Adrenals/Urinary Tract: Horseshoe configuration of the kidney. Punctate less than 2 mm nonobstructing calculi within the left moiety. The adrenals are unremarkable. The bladder is decompressed, limiting its  evaluation. Stomach/Bowel: No bowel obstruction or ileus. Normal appendix right lower quadrant. There is diffuse colonic wall thickening most pronounced from the splenic flexure through the rectum, compatible with inflammatory or infectious colitis. Vascular/Lymphatic: No significant vascular findings are present. No enlarged abdominal or pelvic lymph nodes. Reproductive: Prostate is unremarkable. Other: No free fluid or free intraperitoneal gas. No abdominal wall hernia. Musculoskeletal: No acute or destructive bony abnormalities. Reconstructed images demonstrate no additional findings. IMPRESSION: 1. Mild diffuse colonic wall thickening greatest from the splenic flexure through the rectum, consistent with inflammatory or infectious colitis. 2. Dependent ground-glass attenuation within the left lower lobe, which may be hypoventilatory or related to inflammatory/infectious etiology. 3. 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 Electronically Signed   By: Bobbye Burrow M.D.   On: 11/09/2023 22:05      Rosena Conradi, MD  [facility] 11/11/2023  If 7PM-7AM, please contact night-coverage

## 2023-11-12 DIAGNOSIS — R112 Nausea with vomiting, unspecified: Secondary | ICD-10-CM | POA: Diagnosis not present

## 2023-11-12 LAB — RENAL FUNCTION PANEL
Albumin: 3.4 g/dL — ABNORMAL LOW (ref 3.5–5.0)
Anion gap: 20 — ABNORMAL HIGH (ref 5–15)
BUN: 53 mg/dL — ABNORMAL HIGH (ref 6–20)
CO2: 21 mmol/L — ABNORMAL LOW (ref 22–32)
Calcium: 7.7 mg/dL — ABNORMAL LOW (ref 8.9–10.3)
Chloride: 87 mmol/L — ABNORMAL LOW (ref 98–111)
Creatinine, Ser: 14.25 mg/dL — ABNORMAL HIGH (ref 0.61–1.24)
GFR, Estimated: 4 mL/min — ABNORMAL LOW (ref >60)
Glucose, Bld: 118 mg/dL — ABNORMAL HIGH (ref 70–99)
Phosphorus: 8.2 mg/dL — ABNORMAL HIGH (ref 2.5–4.6)
Potassium: 4.4 mmol/L (ref 3.5–5.1)
Sodium: 128 mmol/L — ABNORMAL LOW (ref 135–145)

## 2023-11-12 LAB — CBC
HCT: 33.7 % — ABNORMAL LOW (ref 39.0–52.0)
Hemoglobin: 10.9 g/dL — ABNORMAL LOW (ref 13.0–17.0)
MCH: 27.3 pg (ref 26.0–34.0)
MCHC: 32.3 g/dL (ref 30.0–36.0)
MCV: 84.3 fL (ref 80.0–100.0)
Platelets: 227 10*3/uL (ref 150–400)
RBC: 4 MIL/uL — ABNORMAL LOW (ref 4.22–5.81)
RDW: 15.9 % — ABNORMAL HIGH (ref 11.5–15.5)
WBC: 9.6 10*3/uL (ref 4.0–10.5)
nRBC: 0 % (ref 0.0–0.2)

## 2023-11-12 LAB — GLUCOSE, CAPILLARY
Glucose-Capillary: 107 mg/dL — ABNORMAL HIGH (ref 70–99)
Glucose-Capillary: 116 mg/dL — ABNORMAL HIGH (ref 70–99)
Glucose-Capillary: 121 mg/dL — ABNORMAL HIGH (ref 70–99)
Glucose-Capillary: 132 mg/dL — ABNORMAL HIGH (ref 70–99)
Glucose-Capillary: 151 mg/dL — ABNORMAL HIGH (ref 70–99)
Glucose-Capillary: 99 mg/dL (ref 70–99)

## 2023-11-12 MED ORDER — HEPARIN SODIUM (PORCINE) 1000 UNIT/ML DIALYSIS
20.0000 [IU]/kg | INTRAMUSCULAR | Status: DC | PRN
  Administered 2023-11-12: 1800 [IU] via INTRAVENOUS_CENTRAL

## 2023-11-12 MED ORDER — HEPARIN SODIUM (PORCINE) 1000 UNIT/ML IJ SOLN
INTRAMUSCULAR | Status: AC
  Filled 2023-11-12: qty 1

## 2023-11-12 MED ORDER — ACETAMINOPHEN 325 MG PO TABS
ORAL_TABLET | ORAL | Status: AC
  Filled 2023-11-12: qty 1

## 2023-11-12 NOTE — Plan of Care (Signed)
  Problem: Education: Goal: Knowledge of General Education information will improve Description: Including pain rating scale, medication(s)/side effects and non-pharmacologic comfort measures Outcome: Progressing   Problem: Clinical Measurements: Goal: Respiratory complications will improve Outcome: Progressing Goal: Cardiovascular complication will be avoided Outcome: Progressing   Problem: Activity: Goal: Risk for activity intolerance will decrease Outcome: Progressing   Problem: Clinical Measurements: Goal: Ability to maintain clinical measurements within normal limits will improve Outcome: Not Progressing

## 2023-11-12 NOTE — Progress Notes (Signed)
 [facility] Progress Note   Subjective:   Reports abdominal pain is a little bit better this AM. Reports mild SOB. Denies CP.   Objective Vitals:   11/12/23 0519 11/12/23 0754 11/12/23 0845 11/12/23 0850  BP: 134/82 (!) 162/97    Pulse: 81     Resp: 18 18    Temp: 98.3 F (36.8 C) 98.2 F (36.8 C) 97.7 F (36.5 C)   TempSrc: Oral Oral Oral   SpO2: 94%     Weight: 87.8 kg   87.8 kg  Height:       Physical Exam General: Alert male, NAD Heart: RRR, no murmurs, rubs or gallops Lungs: CTA bilaterally, respirations unlabored Abdomen: Non-distended, +BS Extremities: No edema b/l lower extremities Dialysis Access: LUE AVF + t/b, large aneurysms  Additional Objective Labs: Basic Metabolic Panel: Recent Labs  Lab 11/09/23 2017 11/10/23 0226 11/11/23 0244  NA 135 132* 131*  K 5.0 5.1 3.3*  CL 90* 92* 90*  CO2 18* 21* 24  GLUCOSE 179* 155* 90  BUN 62* 65* 38*  CREATININE 15.35* 15.57* 9.78*  CALCIUM  8.0* 7.4* 8.0*  PHOS  --  6.8*  --    Liver Function Tests: Recent Labs  Lab 11/09/23 2017  AST 20  ALT 15  ALKPHOS 62  BILITOT 0.7  PROT 9.0*  ALBUMIN 4.3   Recent Labs  Lab 11/09/23 2017  LIPASE 27   CBC: Recent Labs  Lab 11/09/23 2017 11/10/23 0226 11/11/23 0244 11/12/23 0901  WBC 8.1 7.5 7.5 9.6  HGB 12.9* 12.2* 11.2* 10.9*  HCT 40.5 37.7* 34.7* 33.7*  MCV 84.7 83.8 84.0 84.3  PLT 260 252 220 227   Blood Culture    Component Value Date/Time   SDES BLOOD RIGHT HAND 05/22/2023 1146   SPECREQUEST  05/22/2023 1146    BOTTLES DRAWN AEROBIC AND ANAEROBIC Blood Culture results may not be optimal due to an excessive volume of blood received in culture bottles   CULT  05/22/2023 1146    NO GROWTH 5 DAYS Performed at [facility], [address], [place], [place] [address]    REPTSTATUS 05/27/2023 FINAL 05/22/2023 1146    Cardiac Enzymes: No results for input(s): "CKTOTAL", "CKMB", "CKMBINDEX", "TROPONINI" in the last 168  hours. CBG: Recent Labs  Lab 11/11/23 1549 11/11/23 2005 11/12/23 0015 11/12/23 0515 11/12/23 0751  GLUCAP 118* 130* 132* 99 116*   Iron Studies: No results for input(s): "IRON", "TIBC", "TRANSFERRIN", "FERRITIN" in the last 72 hours. @lablastinr3 @ Studies/Results: No results found. Medications:   Chlorhexidine  Gluconate Cloth  6 each Topical Q0600   Chlorhexidine  Gluconate Cloth  6 each Topical Q0600   cloNIDine   0.1 mg Oral BID   insulin  aspart  0-6 Units Subcutaneous Q4H   insulin  glargine-yfgn  5 Units Subcutaneous QHS   labetalol   100 mg Oral BID   pantoprazole  (PROTONIX ) IV  40 mg Intravenous Q12H   sodium chloride  flush  3 mL Intravenous Q12H    Dialysis Orders:  TTS [facility] 4h  B450   88.5kg  LUE AVF  Heparin  500 bolus + 1000/hr     BUN 65 creat 15   K 5.1  Na 132   CO2 21   Hb 12.2     Assessment/Plan: Abdominal pain/ N/V: per CT possibly has colitis. Management per primary team ESRD: on HD TTS. HD today, continue TTS schedule HTNsive urgency: possibly due to not keeping BP meds down at home. Improved but still elevated, pain may  be contributing.  HTN: cont home meds, no vol overload on exam Volume: euvolemic on exam, just under dry wt with minimal PO intake.  Anemia of esrd: Hb at goal, no ESA indicated at this time.  Secondary hyperparathyroidism: Corrected calcium  at goal, phos elevated. Resume binders once tolerating PO  [name], PA-C 11/12/2023, 9:51 AM  [facility] Pager: [phone]

## 2023-11-12 NOTE — Progress Notes (Signed)
 PROGRESS NOTE  [name] WUJ:[id number] DOB: 31-Oct-1985 DOA: 11/09/2023 PCP: Senaida Dama, NP   LOS: 1 day   Brief narrative:  [name] is a 38 y.o. male with medical history significant for insulin -dependent diabetes mellitus, hypertension, chronic diastolic CHF, blindness, and ESRD on hemodialysis, presented to hospital with abdominal pain nausea and vomiting with some loose stools which has improved since admission.  In the ED, patient had elevated blood pressure.  CT showed mild inflammatory/infective colitis.    Assessment/Plan: Principal Problem:   Intractable nausea and vomiting Active Problems:   Hypertensive urgency   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)   ESRD (end stage renal disease) on dialysis (HCC)   Chronic diastolic CHF (congestive heart failure) (HCC)   Intractable vomiting   Prolonged QT interval    Nausea vomiting abdominal pain.   CT scan showed some features of colitis.  Patient without fever or elevated WBC.  Will avoid antibiotic for now.  History of esophagitis in January when he had similar symptoms and had EGD done at that time..  Continue supportive care including hydration antiemetic,s analgesics.  IV Protonix  twice daily.  States that his diabetes under good control.  Hemoglobin A1c of 6.4.  Will advance to full liquid this morning.  Will advance diet as tolerated.  Was able to hold clears overnight.    Diabetes mellitus type 2.  On Semglee  and sliding scale insulin  at this time.  Will continue to monitor closely.  Last known hemoglobin A1c was 6.0 in January 2023.  Latest hemoglobin A1c of 6.4.  Glycemic control adequate at this time.  Latest POC glucose of 121.  Will advance to full liquids  End-stage renal disease on hemodialysis.  Nephrology has been notified for dialysis needs.  Tuesday Thursday Saturday schedule.  Seen during hemodialysis   Accelerated hypertension.  Will continue to monitor closely.   Continue labetalol  and clonidine .  Overall blood pressure has started to improve.   Congestive heart failure with preserved ejection fraction.  EF of 55 to 60% from last year.  Appears compensated.  DVT prophylaxis: SCDs Start: 11/10/23 0118   Disposition: Home likely in 1 to 2 days  Status is: Inpatient The patient is  inpatient because: Intractable vomiting and nausea, pending clinical improvement,    Code Status:     Code Status: Full Code  Family Communication: None at bedside  Consultants: Nephrology  Procedures: Hemodialysis  Anti-infectives:  None  Anti-infectives (From admission, onward)    None        Subjective: Today, patient was seen and examined at bedside.  Seen during hemodialysis.  States that he was able to keep the clears down.  Denies overt pain nausea or vomiting today..  Objective: Vitals:   11/12/23 1215 11/12/23 1308  BP:  (!) 182/103  Pulse:  80  Resp:  18  Temp: 98.6 F (37 C) 98.5 F (36.9 C)  SpO2:  98%    Intake/Output Summary (Last 24 hours) at 11/12/2023 1430 Last data filed at 11/12/2023 1215 Gross per 24 hour  Intake 300 ml  Output 0 ml  Net 300 ml   Filed Weights   11/10/23 0214 11/12/23 0519 11/12/23 0850  Weight: 88 kg 87.8 kg 87.8 kg   Body mass index is 26.25 kg/m.   Physical Exam: GENERAL: Patient is alert awake and oriented HENT: No scleral pallor or icterus.  Blindness. Oral mucosa is moist NECK: is supple, no gross swelling noted.  CHEST: Clear to auscultation. No crackles or wheezes.  Diminished breath sounds bilaterally. CVS: S1 and S2 heard, no murmur. Regular rate and rhythm.  ABDOMEN: Soft, generalized tenderness over the general abdomen.  Bowel sounds present.   EXTREMITIES: No edema. CNS: Cranial nerves are intact. No focal motor deficits. SKIN: warm and dry without rashes.  Skin with dark pigmentation.  Data Review: I have personally reviewed the following laboratory data and  studies,  CBC: Recent Labs  Lab 11/09/23 2017 11/10/23 0226 11/11/23 0244 11/12/23 0901  WBC 8.1 7.5 7.5 9.6  HGB 12.9* 12.2* 11.2* 10.9*  HCT 40.5 37.7* 34.7* 33.7*  MCV 84.7 83.8 84.0 84.3  PLT 260 252 220 227   Basic Metabolic Panel: Recent Labs  Lab 11/09/23 2017 11/10/23 0226 11/11/23 0244 11/12/23 0901  NA 135 132* 131* 128*  K 5.0 5.1 3.3* 4.4  CL 90* 92* 90* 87*  CO2 18* 21* 24 21*  GLUCOSE 179* 155* 90 118*  BUN 62* 65* 38* 53*  CREATININE 15.35* 15.57* 9.78* 14.25*  CALCIUM  8.0* 7.4* 8.0* 7.7*  MG  --  2.2 2.1  --   PHOS  --  6.8*  --  8.2*   Liver Function Tests: Recent Labs  Lab 11/09/23 2017 11/12/23 0901  AST 20  --   ALT 15  --   ALKPHOS 62  --   BILITOT 0.7  --   PROT 9.0*  --   ALBUMIN 4.3 3.4*   Recent Labs  Lab 11/09/23 2017  LIPASE 27   No results for input(s): "AMMONIA" in the last 168 hours. Cardiac Enzymes: No results for input(s): "CKTOTAL", "CKMB", "CKMBINDEX", "TROPONINI" in the last 168 hours. BNP (last 3 results) Recent Labs    08/02/23 1120  BNP 3,974.0*    ProBNP (last 3 results) No results for input(s): "PROBNP" in the last 8760 hours.  CBG: Recent Labs  Lab 11/11/23 2005 11/12/23 0015 11/12/23 0515 11/12/23 0751 11/12/23 1300  GLUCAP 130* 132* 99 116* 121*   Recent Results (from the past 240 hours)  COVID-19, Flu A+B and RSV     Status: None   Collection Time: 11/07/23 11:06 AM   Specimen: Nasopharyngeal(NP) swabs in vial transport medium   Nasopharynge  Resident  Result Value Ref Range Status   SARS-CoV-2, NAA Not Detected Not Detected Final   Influenza A, NAA Not Detected Not Detected Final   Influenza B, NAA Not Detected Not Detected Final   RSV, NAA Not Detected Not Detected Final   Test Information: Comment  Final    Comment: This nucleic acid amplification test was developed and its performance characteristics determined by [employer]. Nucleic acid amplification tests include RT-PCR  and TMA. This test has not been FDA cleared or approved. This test has been authorized by FDA under an Emergency Use Authorization (EUA). This test is only authorized for the duration of time the declaration that circumstances exist justifying the authorization of the emergency use of in vitro diagnostic tests for detection of SARS-CoV-2 virus and/or diagnosis of COVID-19 infection under section 564(b)(1) of the Act, 21 U.S.C. 621HYQ-6(V) (1), unless the authorization is terminated or revoked sooner. When diagnostic testing is negative, the possibility of a false negative result should be considered in the context of a patient's recent exposures and the presence of clinical signs and symptoms consistent with COVID-19. An individual without symptoms of COVID-19 and who is not shedding SARS-CoV-2 virus wo uld expect to have a negative (not detected) result in  this assay.      Studies: No results found.     Rosena Conradi, MD  [facility] 11/12/2023  If 7PM-7AM, please contact night-coverage

## 2023-11-12 NOTE — Progress Notes (Signed)
 Received patient in bed.Alert and oriented to person and place.He is blind. Consent verified.  Access used : Right arm avg that worked well.  Duration of treatment :  3 hours.  UF goal: Even.  Medicines given: Heparin  2800 units mid run dose.                                                         Tylenol  650 mg.                              Hemo comment: Last 7 minutes of his treatment .circuit clogged.  Hand off to the patient's nurse via transporter with stable condition.

## 2023-11-13 DIAGNOSIS — R112 Nausea with vomiting, unspecified: Secondary | ICD-10-CM | POA: Diagnosis not present

## 2023-11-13 LAB — GLUCOSE, CAPILLARY
Glucose-Capillary: 102 mg/dL — ABNORMAL HIGH (ref 70–99)
Glucose-Capillary: 119 mg/dL — ABNORMAL HIGH (ref 70–99)
Glucose-Capillary: 135 mg/dL — ABNORMAL HIGH (ref 70–99)

## 2023-11-13 NOTE — Discharge Summary (Signed)
 Physician Discharge Summary  [name]:[id number] DOB: 1985-12-25 DOA: 11/09/2023  PCP: James Dama, NP  Admit date: 11/09/2023 Discharge date: 11/13/2023  Admitted From: Home  Discharge disposition: Home  Recommendations for Outpatient Follow-Up:   Follow up with your primary care provider in one week.  Check CBC, BMP, magnesium in the next visit Continue dialysis as scheduled as outpatient.  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  Diet recommendation: Low sodium, heart healthy.  Carbohydrate-modified.  Small portion meals, avoid fatty fried greasy food.  Wound care: None.  Code status: Full.   History of Present Illness:    [name] is a 38 y.o. male with medical history significant for insulin -dependent diabetes mellitus, hypertension, chronic diastolic CHF, blindness, and ESRD on hemodialysis, presented to hospital with abdominal pain nausea and vomiting with some loose stools which has improved since admission.  In the ED, patient had elevated blood pressure.  CT showed mild inflammatory/infective colitis.    Hospital Course:   Following conditions were addressed during hospitalization as listed below,  Nausea vomiting abdominal pain.   Improved at this time.  CT scan showed some features of colitis.  Patient without fever or elevated WBC.  Has improved at this time and has tolerated oral diet.  Was treated symptomatically during hospitalization.  Will continue antiemetics and Protonix  on discharge.. Hemoglobin A1c of 6.4.     Diabetes mellitus type 2.  On Semglee  and sliding scale insulin  at this time.    Latest  hemoglobin A1c of 6.4.   End-stage renal disease on hemodialysis.    Tuesday Thursday Saturday schedule.  Will resume dialysis after discharge.   Accelerated hypertension.   Improved.  Continue labetalol  and clonidine .    Congestive heart failure with preserved ejection fraction.  EF of 55 to 60% from last year.  Appears compensated.  Disposition.  At this time, patient is stable for disposition home with outpatient PCP follow-up.  Medical Consultants:   Nephrology  Procedures:    Hemodialysis Subjective:   Today, patient was seen and examined at bedside.  Denies any nausea vomiting abdominal pain.  Has tolerated oral diet.  Okay for discharge home.  Discharge Exam:   Vitals:   11/13/23 0420 11/13/23 0740  BP: (!) 137/97 (!) 165/99  Pulse: 95 80  Resp: 18   Temp: 98.1 F (36.7 C) 98.4 F (36.9 C)  SpO2: 98% 93%   Vitals:   11/12/23 1951 11/13/23 0015 11/13/23 0420 11/13/23 0740  BP: (!) 168/111 (!) 148/93 (!) 137/97 (!) 165/99  Pulse: 95 95 95 80  Resp: 18 18 18    Temp: 98.2 F (36.8 C) 98.2 F (36.8 C) 98.1 F (36.7 C) 98.4 F (36.9 C)  TempSrc: Oral Oral Oral Oral  SpO2: 95% 93% 98% 93%  Weight:   89.4 kg   Height:       Body mass index is 26.75 kg/m.  General: Alert awake, not in obvious distress, oriented HENT: Blindness noted no scleral pallor or icterus noted. Oral mucosa is moist.  Chest:  Clear breath sounds.  No crackles or wheezes.  CVS: S1 &S2 heard. No murmur.  Regular rate  and rhythm. Abdomen: Soft, nontender, nondistended.  Bowel sounds are heard.   Extremities: No cyanosis, clubbing or edema.  Peripheral pulses are palpable. Psych: Alert, awake and oriented, normal mood CNS:  No cranial nerve deficits.  Power equal in all extremities.   Skin: Warm and dry.  Skin with dark pigmentation.  The results of significant diagnostics from this hospitalization (including imaging, microbiology, ancillary and laboratory) are listed below for reference.      Diagnostic Studies:   CT ABDOMEN PELVIS W CONTRAST Result Date: 11/09/2023 CLINICAL DATA:  Abdominal pain EXAM: CT ABDOMEN AND PELVIS WITH CONTRAST TECHNIQUE: Multidetector CT imaging of the abdomen and pelvis was performed using the standard protocol following bolus administration of intravenous contrast. RADIATION DOSE REDUCTION: This exam was performed according to the departmental dose-optimization program which includes automated exposure control, adjustment of the mA and/or kV according to patient size and/or use of iterative reconstruction technique. CONTRAST:  75mL OMNIPAQUE  IOHEXOL  350 MG/ML SOLN COMPARISON:  08/04/2023 FINDINGS: Lower chest: Dependent ground-glass consolidation within the left lower lobe may be inflammatory, infectious, or hypoventilatory. Stable cardiomegaly. Hepatobiliary: No focal liver abnormality is seen. No gallstones, gallbladder wall thickening, or biliary dilatation. Pancreas: Unremarkable. No pancreatic ductal dilatation or surrounding inflammatory changes. Spleen: Normal in size without focal abnormality. Adrenals/Urinary Tract: Horseshoe configuration of the kidney. Punctate less than 2 mm nonobstructing calculi within the left moiety. The adrenals are unremarkable. The bladder is decompressed, limiting its evaluation. Stomach/Bowel: No bowel obstruction or ileus. Normal appendix right lower quadrant. There is diffuse colonic wall thickening most pronounced from the splenic flexure through the rectum, compatible with inflammatory or infectious colitis. Vascular/Lymphatic: No significant vascular findings are present. No enlarged abdominal or pelvic lymph nodes. Reproductive: Prostate is unremarkable. Other: No free fluid or free intraperitoneal gas. No abdominal wall hernia. Musculoskeletal: No acute or destructive bony abnormalities. Reconstructed images demonstrate no additional findings. IMPRESSION: 1. Mild diffuse colonic wall thickening greatest from the splenic  flexure through the rectum, consistent with inflammatory or infectious colitis. 2. Dependent ground-glass attenuation within the left lower lobe, which may be hypoventilatory or related to inflammatory/infectious etiology. 3. 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 Electronically Signed   By: Bobbye Burrow M.D.   On: 11/09/2023 22:05     Labs:   Basic Metabolic Panel: Recent Labs  Lab 11/09/23 2017 11/10/23 0226 11/11/23 0244 11/12/23 0901  NA 135 132* 131* 128*  K 5.0 5.1 3.3* 4.4  CL 90* 92* 90* 87*  CO2 18* 21* 24 21*  GLUCOSE 179* 155* 90 118*  BUN 62* 65* 38* 53*  CREATININE 15.35* 15.57* 9.78* 14.25*  CALCIUM  8.0* 7.4* 8.0* 7.7*  MG  --  2.2 2.1  --   PHOS  --  6.8*  --  8.2*   GFR Estimated Creatinine Clearance: 7.7 mL/min (A) (by C-G formula based on SCr of 14.25 mg/dL (H)). Liver Function Tests: Recent Labs  Lab 11/09/23 2017 11/12/23 0901  AST 20  --   ALT 15  --   ALKPHOS 62  --   BILITOT 0.7  --   PROT 9.0*  --   ALBUMIN 4.3 3.4*   Recent Labs  Lab 11/09/23 2017  LIPASE 27   No results for input(s): "AMMONIA" in the last 168 hours. Coagulation profile No results for input(s): "INR", "PROTIME" in the last 168 hours.  CBC: Recent Labs  Lab 11/09/23 2017 11/10/23 0226 11/11/23 0244 11/12/23 0901  WBC 8.1 7.5 7.5 9.6  HGB 12.9* 12.2* 11.2* 10.9*  HCT 40.5 37.7* 34.7* 33.7*  MCV 84.7 83.8 84.0 84.3  PLT 260 252 220 227   Cardiac Enzymes: No results for input(s): "CKTOTAL", "CKMB", "CKMBINDEX", "TROPONINI" in the last 168 hours. BNP: Invalid input(s): "POCBNP" CBG: Recent Labs  Lab 11/12/23 1710 11/12/23 2006 11/13/23 0012 11/13/23 0419 11/13/23 0736  GLUCAP 151* 107* 135* 119* 102*   D-Dimer No results for input(s): "DDIMER" in the last 72 hours. Hgb A1c No results for input(s): "HGBA1C" in the last 72 hours. Lipid Profile No results for input(s): "CHOL", "HDL", "LDLCALC", "TRIG",  "CHOLHDL", "LDLDIRECT" in the last 72 hours. Thyroid function studies No results for input(s): "TSH", "T4TOTAL", "T3FREE", "THYROIDAB" in the last 72 hours.  Invalid input(s): "FREET3" Anemia work up No results for input(s): "VITAMINB12", "FOLATE", "FERRITIN", "TIBC", "IRON", "RETICCTPCT" in the last 72 hours. Microbiology Recent Results (from the past 240 hours)  COVID-19, Flu A+B and RSV     Status: None   Collection Time: 11/07/23 11:06 AM   Specimen: Nasopharyngeal(NP) swabs in vial transport medium   Nasopharynge  Resident  Result Value Ref Range Status   SARS-CoV-2, NAA Not Detected Not Detected Final   Influenza A, NAA Not Detected Not Detected Final   Influenza B, NAA Not Detected Not Detected Final   RSV, NAA Not Detected Not Detected Final   Test Information: Comment  Final    Comment: This nucleic acid amplification test was developed and its performance characteristics determined by [employer]. Nucleic acid amplification tests include RT-PCR and TMA. This test has not been FDA cleared or approved. This test has been authorized by FDA under an Emergency Use Authorization (EUA). This test is only authorized for the duration of time the declaration that circumstances exist justifying the authorization of the emergency use of in vitro diagnostic tests for detection of SARS-CoV-2 virus and/or diagnosis of COVID-19 infection under section 564(b)(1) of the Act, 21 U.S.C. 161WRU-0(A) (1), unless the authorization is terminated or revoked sooner. When diagnostic testing is negative, the possibility of a false negative result should be considered in the context of a patient's recent exposures and the presence of clinical signs and symptoms consistent with COVID-19. An individual without symptoms of COVID-19 and who is not shedding SARS-CoV-2 virus wo uld expect to have a negative (not detected) result in this assay.      Discharge Instructions:   Discharge  Instructions     Call MD for:  persistant nausea and vomiting   Complete by: As directed    Call MD for:  severe uncontrolled pain   Complete by: As directed    Diet - low sodium heart healthy   Complete by: As directed    Discharge instructions   Complete by: As directed    Follow-up with your primary care provider in 1 week.  Seek medical attention for worsening symptoms.  Stick to small portion frequent meals.  Avoid fatty fried greasy food if possible.   Increase activity slowly   Complete by: As directed       Allergies as of 11/13/2023       Reactions   Apresoline  [hydralazine ] Nausea And Vomiting, Other (See Comments)   Patient was taking Hydralazine  AND Amlodipine  at the same time, so the reactions came from one of the 2: Lethargy and an all-over feeling of NOT feeling well   Norvasc  [amlodipine ] Nausea And Vomiting, Other (See Comments)   Patient was taking Amlodipine  AND Hydralazine  at the same time, so  the reactions came from one of the 2: Lethargy and an all-over feeling of NOT feeling well         Medication List     TAKE these medications    atorvastatin  40 MG tablet Commonly known as: LIPITOR Take 1 tablet (40 mg total) by mouth daily. What changed: when to take this   benzonatate  100 MG capsule Commonly known as: Tessalon  Perles Take 1 capsule (100 mg total) by mouth 3 (three) times daily as needed.   cloNIDine  0.1 MG tablet Commonly known as: CATAPRES  Take 1 tablet (0.1 mg total) by mouth 2 (two) times daily.   diphenhydramine -acetaminophen  25-500 MG Tabs tablet Commonly known as: TYLENOL  PM Take 2 tablets by mouth 2 (two) times daily as needed (pain, sleep).   insulin  glargine-yfgn 100 UNIT/ML injection Commonly known as: SEMGLEE  Inject 0.12 mLs (12 Units total) into the skin at bedtime. What changed: how much to take   insulin  lispro 100 UNIT/ML KwikPen Commonly known as: HUMALOG  Inject 1-7 Units into the skin 3 (three) times daily. Per sliding  scale 3 times daily with meals 151-200= 1 unit 201-250= 2 units 251-300= 3 units 301-350= 5 units 351-400= 7 units Greater than 400 call md takes only as needed if blood sugar is over 200   labetalol  200 MG tablet Commonly known as: NORMODYNE  Take 200 mg by mouth 2 (two) times daily.   Lokelma  10 g Pack packet Generic drug: sodium zirconium cyclosilicate  Take 10 g by mouth See admin instructions. Take 1 packet (10g) once daily on non-dialysis days (Sunday, Monday, Wednesday, Friday)   metoCLOPramide  5 MG tablet Commonly known as: REGLAN  Take 5 mg by mouth 4 (four) times daily -  before meals and at bedtime.   minoxidil 2.5 MG tablet Commonly known as: LONITEN Take 2.5 mg by mouth 2 (two) times daily.   multivitamin with minerals tablet Take 1 tablet by mouth daily.   pantoprazole  40 MG tablet Commonly known as: PROTONIX  TAKE 1 TABLET (40 MG TOTAL) BY MOUTH 2 (TWO) TIMES DAILY.   sucralfate  1 GM/10ML suspension Commonly known as: CARAFATE  Take 10 mLs (1 g total) by mouth 4 (four) times daily.        Follow-up Information     [facility] .   Specialty: Emergency Medicine Why: If symptoms worsen Contact information: [address] [place]  [address] [phone]        James Dama, NP. Schedule an appointment as soon as possible for a visit .   Specialty: Nurse Practitioner Contact information: [address] [place] [address] [phone]                  Time coordinating discharge: 39 minutes  Signed:  Ailsa Mireles  [facility] 11/13/2023, 9:05 AM

## 2023-11-13 NOTE — Progress Notes (Signed)
 [facility] Progress Note   Subjective:   Tolerated HD yesterday with no UF. Denies SOB, dizziness. Abdominal pain and nausea improved.   Objective Vitals:   11/12/23 1308 11/12/23 1951 11/13/23 0015 11/13/23 0420  BP: (!) 182/103 (!) 168/111 (!) 148/93 (!) 137/97  Pulse: 80 95 95 95  Resp: 18 18 18 18   Temp: 98.5 F (36.9 C) 98.2 F (36.8 C) 98.2 F (36.8 C) 98.1 F (36.7 C)  TempSrc: Oral Oral Oral Oral  SpO2: 98% 95% 93% 98%  Weight:    89.4 kg  Height:       Physical Exam General: Alert male, NAD Heart: RRR, no murmurs, rubs or gallops Lungs: CTA bilaterally, respirations unlabored Abdomen: Non-distended, +BS Extremities: No edema b/l lower extremities Dialysis Access: LUE AVF + t/b, large aneurysms    Additional Objective Labs: Basic Metabolic Panel: Recent Labs  Lab 11/10/23 0226 11/11/23 0244 11/12/23 0901  NA 132* 131* 128*  K 5.1 3.3* 4.4  CL 92* 90* 87*  CO2 21* 24 21*  GLUCOSE 155* 90 118*  BUN 65* 38* 53*  CREATININE 15.57* 9.78* 14.25*  CALCIUM  7.4* 8.0* 7.7*  PHOS 6.8*  --  8.2*   Liver Function Tests: Recent Labs  Lab 11/09/23 2017 11/12/23 0901  AST 20  --   ALT 15  --   ALKPHOS 62  --   BILITOT 0.7  --   PROT 9.0*  --   ALBUMIN 4.3 3.4*   Recent Labs  Lab 11/09/23 2017  LIPASE 27   CBC: Recent Labs  Lab 11/09/23 2017 11/10/23 0226 11/11/23 0244 11/12/23 0901  WBC 8.1 7.5 7.5 9.6  HGB 12.9* 12.2* 11.2* 10.9*  HCT 40.5 37.7* 34.7* 33.7*  MCV 84.7 83.8 84.0 84.3  PLT 260 252 220 227   Blood Culture    Component Value Date/Time   SDES BLOOD RIGHT HAND 05/22/2023 1146   SPECREQUEST  05/22/2023 1146    BOTTLES DRAWN AEROBIC AND ANAEROBIC Blood Culture results may not be optimal due to an excessive volume of blood received in culture bottles   CULT  05/22/2023 1146    NO GROWTH 5 DAYS Performed at [facility], [address], [place], [place] [address]    REPTSTATUS 05/27/2023 FINAL 05/22/2023  1146    Cardiac Enzymes: No results for input(s): "CKTOTAL", "CKMB", "CKMBINDEX", "TROPONINI" in the last 168 hours. CBG: Recent Labs  Lab 11/12/23 1710 11/12/23 2006 11/13/23 0012 11/13/23 0419 11/13/23 0736  GLUCAP 151* 107* 135* 119* 102*   Iron Studies: No results for input(s): "IRON", "TIBC", "TRANSFERRIN", "FERRITIN" in the last 72 hours. @lablastinr3 @ Studies/Results: No results found. Medications:   Chlorhexidine  Gluconate Cloth  6 each Topical Q0600   Chlorhexidine  Gluconate Cloth  6 each Topical Q0600   cloNIDine   0.1 mg Oral BID   insulin  aspart  0-6 Units Subcutaneous Q4H   insulin  glargine-yfgn  5 Units Subcutaneous QHS   labetalol   100 mg Oral BID   pantoprazole  (PROTONIX ) IV  40 mg Intravenous Q12H   sodium chloride  flush  3 mL Intravenous Q12H    Dialysis Orders: TTS [facility] 4h  B450   88.5kg  LUE AVF  Heparin  500 bolus + 1000/hr     BUN 65 creat 15   K 5.1  Na 132   CO2 21   Hb 12.2     Assessment/Plan: Abdominal pain/ N/V: per CT possibly has colitis. Management per primary team ESRD: on HD TTS. Next HD  Tuesday HTNsive urgency: possibly due to not keeping BP meds down at home. Improved but still elevated, pain may be contributing.  HTN: cont home meds, no vol overload on exam Volume: euvolemic on exam, just under dry wt with minimal PO intake.  Anemia of esrd: Hb at goal, no ESA indicated at this time.  Secondary hyperparathyroidism: Corrected calcium  a bit low, will use higher Ca bath, phos elevated. Resuming binders,   [name], PA-C 11/13/2023, 8:11 AM  [facility] Pager: [phone]

## 2023-11-14 ENCOUNTER — Telehealth: Payer: Self-pay

## 2023-11-14 NOTE — Transitions of Care (Post Inpatient/ED Visit) (Unsigned)
   11/14/2023  Name: [name] MRN: [id number] DOB: Dec 12, 1985  Today's TOC FU Call Status: Today's TOC FU Call Status:: Unsuccessful Call (1st Attempt) Unsuccessful Call (1st Attempt) Date: 11/14/23  Attempted to reach the patient regarding the most recent Inpatient/ED visit.  Follow Up Plan: Additional outreach attempts will be made to reach the patient to complete the Transitions of Care (Post Inpatient/ED visit) call.   Signature Darrall Ellison, LPN [facility] Nurse Health Advisor Direct Dial [phone]

## 2023-11-14 NOTE — Progress Notes (Signed)
 Late Note Entry- Nov 14, 2023  Pt was d/c yesterday. Contacted [facility] this morning to advise clinic of pt's d/c date and that pt should resume care tomorrow. D/C summary and last renal note faxed to clinic for continuation of care.   Lauraine Polite Renal Navigator [phone]

## 2023-11-15 NOTE — Transitions of Care (Post Inpatient/ED Visit) (Unsigned)
   11/15/2023  Name: [name] MRN: [id number] DOB: [birth year]  Today's TOC FU Call Status: Today's TOC FU Call Status:: Unsuccessful Call (2nd Attempt) Unsuccessful Call (1st Attempt) Date: 11/14/23 Unsuccessful Call (2nd Attempt) Date: 11/15/23  Attempted to reach the patient regarding the most recent Inpatient/ED visit.  Follow Up Plan: Additional outreach attempts will be made to reach the patient to complete the Transitions of Care (Post Inpatient/ED visit) call.   Signature Darrall Ellison, LPN [facility] Nurse Health Advisor Direct Dial [phone]

## 2023-11-15 NOTE — Telephone Encounter (Signed)
 The patient returned home from the hospital.  I called him to provide him the phone number for [employer] to call to check on the status of his PCS request; but I had to leave a message requesting a call back.      NCLIFTSS: [phone]

## 2023-11-17 NOTE — Transitions of Care (Post Inpatient/ED Visit) (Signed)
   11/17/2023  Name: [name] MRN: [id number] DOB: Sep 07, 1985  Today's TOC FU Call Status: Today's TOC FU Call Status:: Unsuccessful Call (3rd Attempt) Unsuccessful Call (1st Attempt) Date: 11/14/23 Unsuccessful Call (2nd Attempt) Date: 11/15/23 Unsuccessful Call (3rd Attempt) Date: 11/17/23  Attempted to reach the patient regarding the most recent Inpatient/ED visit.  Follow Up Plan: No further outreach attempts will be made at this time. We have been unable to contact the patient.  Signature Darrall Ellison, LPN [facility] Nurse Health Advisor Direct Dial [phone]

## 2023-11-21 NOTE — Telephone Encounter (Signed)
 I tried to reach the patient again to  provide him the phone number for [employer] to call to check on the status of his PCS request; but the voicemail was not set up      NCLIFTSS: [phone]

## 2023-11-30 ENCOUNTER — Ambulatory Visit: Attending: Family

## 2023-11-30 NOTE — Therapy (Deleted)
 OUTPATIENT PHYSICAL THERAPY LOWER EXTREMITY EVALUATION   Patient Name: [name] MRN: [id number] DOB:[birth year], 38 y.o., male Today's Date: 11/30/2023  END OF SESSION:   Past Medical History:  Diagnosis Date   ADHD (attention deficit hyperactivity disorder)    Diabetes mellitus    ESRD on hemodialysis (HCC)    [facility]   High cholesterol    Hypertension    Hypertrophic cardiomyopathy (HCC) 02/26/2021   Seizure (HCC) 02/08/2021   Past Surgical History:  Procedure Laterality Date   AMPUTATION TOE Right 12/29/2021   Procedure: AMPUTATION TOE, fifth;  Surgeon: Jennefer Moats, DPM;  [facility]: [facility];  Service: Podiatry;  Laterality: Right;  surgical team will do local block   AV FISTULA PLACEMENT Left 08/11/2020   Procedure: LEFT UPPER EXTREMITY ARTERIOVENOUS (AV) FISTULA CREATION;  Surgeon: Carlene Che, MD;  Location: [facility] OR;  Service: Vascular;  Laterality: Left;   BIOPSY  08/04/2023   Procedure: BIOPSY;  Surgeon: Hargis Lias, MD;  [facility];  Service: Endoscopy;;   ESOPHAGOGASTRODUODENOSCOPY (EGD) WITH PROPOFOL  N/A 08/04/2023   Procedure: ESOPHAGOGASTRODUODENOSCOPY (EGD) WITH PROPOFOL ;  Surgeon: Hargis Lias, MD;  [facility];  Service: Endoscopy;  Laterality: N/A;   FISTULA SUPERFICIALIZATION Left 12/02/2021   Procedure: PLICATION OF LEFT RADIUS CEPHALIC FISTULA;  Surgeon: Carlene Che, MD;  Location: [facility] OR;  Service: Vascular;  Laterality: Left;  PERIPHERAL NERVE BLOCK   FRACTURE SURGERY     I & D EXTREMITY Left 07/16/2014   Procedure: IRRIGATION AND DEBRIDEMENT EXTREMITY/LEFT INDEX FINGER;  Surgeon: Brunilda Capra, MD;  Location: [facility] OR;  Service: Orthopedics;  Laterality: Left;   IR FLUORO GUIDE CV LINE RIGHT  10/04/2022   IR FLUORO GUIDE CV LINE RIGHT  10/05/2022   IR PERC TUN PERIT CATH WO PORT S&I /IMAG  08/07/2020   IR REMOVAL TUN CV CATH W/O FL  10/27/2022   IR THROMBECTOMY AV FISTULA W/THROMBOLYSIS/PTA INC/SHUNT/IMG  LEFT Left 10/05/2022   IR US  GUIDE VASC ACCESS RIGHT  08/07/2020   IR US  GUIDE VASC ACCESS RIGHT  10/04/2022   IR US  GUIDE VASC ACCESS RIGHT  10/05/2022   TEE WITHOUT CARDIOVERSION N/A 10/28/2022   Procedure: TRANSESOPHAGEAL ECHOCARDIOGRAM;  Surgeon: Jerryl Morin, DO;  [facility];  Service: Cardiovascular;  Laterality: N/A;   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) 12/29/2021   Status post surgery 12/29/2021   Hypoglycemia 10/30/2021   Acute hypoxic respiratory failure (HCC) 03/12/2021   ESRD (end stage renal disease) on dialysis (HCC) 03/12/2021   Hypertrophic cardiomyopathy (HCC) 02/26/2021   Syncope 02/08/2021   DKA (diabetic ketoacidosis) (HCC) 02/08/2021   Hyperglycemia    Diabetic foot ulcer (HCC) 11/24/2020   Hypervolemia associated with renal insufficiency 08/07/2020   Uremia 08/07/2020   Metabolic acidosis 08/07/2020   Nausea and vomiting 05/07/2020   Dehydration    Hypertensive urgency 02/25/2020   Anemia 12/02/2019   Essential hypertension 12/02/2019   SOB (shortness of breath) 12/02/2019   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  Rationale for Evaluation and Treatment:  Rehabilitation  ONSET DATE: ***  SUBJECTIVE:   SUBJECTIVE STATEMENT: ***  PERTINENT HISTORY: *** PAIN:  Are you having pain? {OPRCPAIN:27236}  PRECAUTIONS: {Therapy precautions:24002}  RED FLAGS: {PT Red Flags:29287}   WEIGHT BEARING RESTRICTIONS: {Yes ***/No:24003}  FALLS:  Has patient fallen in last 6 months? {fallsyesno:27318}  LIVING ENVIRONMENT: Lives with: {OPRC lives with:25569::"lives with their family"} Lives in: {Lives in:25570} Stairs: {opstairs:27293} Has following equipment at home: {Assistive devices:23999}  OCCUPATION: ***  PLOF: {PLOF:24004}  PATIENT GOALS: ***  NEXT MD VISIT: ***  OBJECTIVE:  Note: Objective measures were completed at Evaluation unless otherwise noted.  DIAGNOSTIC FINDINGS: ***  PATIENT SURVEYS:  {rehab surveys:24030}  COGNITION: Overall cognitive status: {cognition:24006}     SENSATION: {sensation:27233}  EDEMA:  {edema:24020}  MUSCLE LENGTH: Hamstrings: Right *** deg; Left *** deg Andy Bannister test: Right *** deg; Left *** deg  POSTURE: {posture:25561}  PALPATION: ***  LOWER EXTREMITY ROM:  {AROM/PROM:27142} ROM Right eval Left eval  Hip flexion    Hip extension    Hip abduction    Hip adduction    Hip internal rotation    Hip external rotation    Knee flexion    Knee extension    Ankle dorsiflexion    Ankle plantarflexion    Ankle inversion    Ankle eversion     (Blank rows = not tested)  LOWER EXTREMITY MMT:  MMT Right eval Left eval  Hip flexion    Hip extension    Hip abduction    Hip adduction    Hip internal rotation    Hip external rotation    Knee flexion    Knee extension    Ankle dorsiflexion    Ankle plantarflexion    Ankle inversion    Ankle eversion     (Blank rows = not tested)  LOWER EXTREMITY SPECIAL TESTS:  {LEspecialtests:26242}  FUNCTIONAL TESTS:  {Functional tests:24029}  GAIT: Distance walked: *** Assistive device utilized: {Assistive devices:23999} Level  of assistance: {Levels of assistance:24026} Comments: ***                                                                                                                                TREATMENT DATE: ***    PATIENT EDUCATION:  Education details: *** Person educated: {Person educated:25204} Education method: {Education Method:25205} Education comprehension: {Education Comprehension:25206}  HOME EXERCISE PROGRAM: ***  ASSESSMENT:  CLINICAL IMPRESSION: Patient is a *** y.o. *** who was seen today for physical therapy evaluation and treatment for ***.   OBJECTIVE IMPAIRMENTS: {opptimpairments:25111}.   ACTIVITY LIMITATIONS: {activitylimitations:27494}  PARTICIPATION LIMITATIONS: {participationrestrictions:25113}  PERSONAL FACTORS: {Personal factors:25162} are also affecting  patient's functional outcome.   REHAB POTENTIAL: {rehabpotential:25112}  CLINICAL DECISION MAKING: {clinical decision making:25114}  EVALUATION COMPLEXITY: {Evaluation complexity:25115}   GOALS: Goals reviewed with patient? Yes  SHORT TERM GOALS: Target date: 12/14/23 I HEP Baseline: Goal status: INITIAL   LONG TERM GOALS: Target date: 02/22/24  *** Baseline:  Goal status: INITIAL  2.  *** Baseline:  Goal status: INITIAL  3.  *** Baseline:  Goal status: INITIAL  4.  *** Baseline:  Goal status: INITIAL  5.  *** Baseline:  Goal status: INITIAL  6.  *** Baseline:  Goal status: INITIAL   PLAN:  PT FREQUENCY: {rehab frequency:25116}  PT DURATION: {rehab duration:25117}  PLANNED INTERVENTIONS: {rehab planned interventions:25118::"97110-Therapeutic exercises","97530- Therapeutic [phone]- Neuromuscular re-education","97535- Self GEXB","28413- Manual therapy"}  PLAN FOR NEXT SESSION: ***   Amal Saiki L Alianah Lofton, PT 11/30/2023, 7:59 AM

## 2023-12-12 ENCOUNTER — Ambulatory Visit: Admitting: Podiatry

## 2023-12-14 DIAGNOSIS — Z992 Dependence on renal dialysis: Secondary | ICD-10-CM

## 2023-12-21 ENCOUNTER — Ambulatory Visit (HOSPITAL_BASED_OUTPATIENT_CLINIC_OR_DEPARTMENT_OTHER): Attending: Family | Admitting: Internal Medicine

## 2023-12-21 ENCOUNTER — Encounter (HOSPITAL_COMMUNITY)
Admission: RE | Disposition: A | Discharge status: Home / Self Care | Payer: Self-pay | Source: Home / Self Care | Attending: Vascular Surgery

## 2023-12-21 ENCOUNTER — Ambulatory Visit (HOSPITAL_COMMUNITY)
Admission: RE | Admit: 2023-12-21 | Discharge: 2023-12-21 | Disposition: A | Discharge status: Home / Self Care | Attending: Vascular Surgery | Admitting: Vascular Surgery

## 2023-12-21 ENCOUNTER — Other Ambulatory Visit: Payer: Self-pay

## 2023-12-21 DIAGNOSIS — T82858A Stenosis of vascular prosthetic devices, implants and grafts, initial encounter: Secondary | ICD-10-CM | POA: Diagnosis not present

## 2023-12-21 DIAGNOSIS — I120 Hypertensive chronic kidney disease with stage 5 chronic kidney disease or end stage renal disease: Secondary | ICD-10-CM | POA: Diagnosis not present

## 2023-12-21 DIAGNOSIS — Y832 Surgical operation with anastomosis, bypass or graft as the cause of abnormal reaction of the patient, or of later complication, without mention of misadventure at the time of the procedure: Secondary | ICD-10-CM | POA: Insufficient documentation

## 2023-12-21 DIAGNOSIS — E1122 Type 2 diabetes mellitus with diabetic chronic kidney disease: Secondary | ICD-10-CM | POA: Diagnosis not present

## 2023-12-21 DIAGNOSIS — T82898A Other specified complication of vascular prosthetic devices, implants and grafts, initial encounter: Secondary | ICD-10-CM

## 2023-12-21 DIAGNOSIS — Z992 Dependence on renal dialysis: Secondary | ICD-10-CM

## 2023-12-21 DIAGNOSIS — N186 End stage renal disease: Secondary | ICD-10-CM

## 2023-12-21 HISTORY — PX: VENOUS ANGIOPLASTY: CATH118376

## 2023-12-21 HISTORY — PX: A/V SHUNT INTERVENTION: CATH118220

## 2023-12-21 LAB — GLUCOSE, CAPILLARY: Glucose-Capillary: 134 mg/dL — ABNORMAL HIGH (ref 70–99)

## 2023-12-21 SURGERY — A/V SHUNT INTERVENTION
Anesthesia: LOCAL

## 2023-12-21 MED ORDER — LIDOCAINE HCL (PF) 1 % IJ SOLN
INTRAMUSCULAR | Status: DC | PRN
  Administered 2023-12-21: 2 mL via SUBCUTANEOUS

## 2023-12-21 MED ORDER — IODIXANOL 320 MG/ML IV SOLN
INTRAVENOUS | Status: DC | PRN
  Administered 2023-12-21: 50 mL via INTRAVENOUS

## 2023-12-21 MED ORDER — LIDOCAINE HCL (PF) 1 % IJ SOLN
INTRAMUSCULAR | Status: AC
  Filled 2023-12-21: qty 30

## 2023-12-21 MED ORDER — HEPARIN (PORCINE) IN NACL 1000-0.9 UT/500ML-% IV SOLN
INTRAVENOUS | Status: DC | PRN
Start: 2023-12-21 — End: 2023-12-21
  Administered 2023-12-21: 500 mL

## 2023-12-21 MED ORDER — MIDAZOLAM HCL 2 MG/2ML IJ SOLN
INTRAMUSCULAR | Status: AC
  Filled 2023-12-21: qty 2

## 2023-12-21 MED ORDER — MIDAZOLAM HCL 2 MG/2ML IJ SOLN
INTRAMUSCULAR | Status: DC | PRN
  Administered 2023-12-21: 1 mg via INTRAVENOUS

## 2023-12-21 SURGICAL SUPPLY — 14 items
BALLOON ATHLETIS 8X80X75 (BALLOONS) IMPLANT
BALLOON MUSTANG 7X80X75 (BALLOONS) IMPLANT
CATH NAVICROSS ANGLED 90CM (MICROCATHETER) IMPLANT
CATH SLIP KMP 65CM 5FR (CATHETERS) IMPLANT
DEVICE TORQUE SEADRAGON GRN (MISCELLANEOUS) IMPLANT
GUIDEWIRE ANGLED .035 180CM (WIRE) IMPLANT
KIT ENCORE 26 ADVANTAGE (KITS) IMPLANT
KIT MICROPUNCTURE NIT STIFF (SHEATH) IMPLANT
KIT PV (KITS) ×3 IMPLANT
SHEATH PINNACLE R/O II 6F 4CM (SHEATH) IMPLANT
SHEATH PROBE COVER 6X72 (BAG) IMPLANT
STOPCOCK MORSE 400PSI 3WAY (MISCELLANEOUS) IMPLANT
TRAY PV CATH (CUSTOM PROCEDURE TRAY) ×3 IMPLANT
TUBING CIL FLEX 10 FLL-RA (TUBING) IMPLANT

## 2023-12-21 NOTE — H&P (Signed)
 HD ACCESS CENTER H&P   Patient ID: ORONDE HALLENBECK, male   DOB: December 20, 1985, 38 y.o.   MRN: [id number]  Subjective:     HPI DONI WIDMER is a 38 y.o. male with ESRD presenting to the HD access center for intervention.  Past Medical History:  Diagnosis Date   ADHD (attention deficit hyperactivity disorder)    Diabetes mellitus    ESRD on hemodialysis (HCC)    [facility]   High cholesterol    Hypertension    Hypertrophic cardiomyopathy (HCC) 02/26/2021   Seizure (HCC) 02/08/2021   Family History  Problem Relation Age of Onset   Cancer Mother    Stroke Father    Diabetes Father    Hypertension Father    Past Surgical History:  Procedure Laterality Date   AMPUTATION TOE Right 12/29/2021   Procedure: AMPUTATION TOE, fifth;  Surgeon: Jennefer Moats, DPM;  [facility];  Service: Podiatry;  Laterality: Right;  surgical team will do local block   AV FISTULA PLACEMENT Left 08/11/2020   Procedure: LEFT UPPER EXTREMITY ARTERIOVENOUS (AV) FISTULA CREATION;  Surgeon: Carlene Che, MD;  Location: [facility] OR;  Service: Vascular;  Laterality: Left;   BIOPSY  08/04/2023   Procedure: BIOPSY;  Surgeon: Hargis Lias, MD;  [facility];  Service: Endoscopy;;   ESOPHAGOGASTRODUODENOSCOPY (EGD) WITH PROPOFOL  N/A 08/04/2023   Procedure: ESOPHAGOGASTRODUODENOSCOPY (EGD) WITH PROPOFOL ;  Surgeon: Hargis Lias, MD;  [facility];  Service: Endoscopy;  Laterality: N/A;   FISTULA SUPERFICIALIZATION Left 12/02/2021   Procedure: PLICATION OF LEFT RADIUS CEPHALIC FISTULA;  Surgeon: Carlene Che, MD;  Location: [facility] OR;  Service: Vascular;  Laterality: Left;  PERIPHERAL NERVE BLOCK   FRACTURE SURGERY     I & D EXTREMITY Left 07/16/2014   Procedure: IRRIGATION AND DEBRIDEMENT EXTREMITY/LEFT INDEX FINGER;  Surgeon: Brunilda Capra, MD;  [facility];  Service: Orthopedics;  Laterality: Left;   IR FLUORO GUIDE CV LINE RIGHT  10/04/2022   IR FLUORO GUIDE CV LINE RIGHT   10/05/2022   IR PERC TUN PERIT CATH WO PORT S&I /IMAG  08/07/2020   IR REMOVAL TUN CV CATH W/O FL  10/27/2022   IR THROMBECTOMY AV FISTULA W/THROMBOLYSIS/PTA INC/SHUNT/IMG LEFT Left 10/05/2022   IR US  GUIDE VASC ACCESS RIGHT  08/07/2020   IR US  GUIDE VASC ACCESS RIGHT  10/04/2022   IR US  GUIDE VASC ACCESS RIGHT  10/05/2022   TEE WITHOUT CARDIOVERSION N/A 10/28/2022   Procedure: TRANSESOPHAGEAL ECHOCARDIOGRAM;  Surgeon: Jerryl Morin, DO;  [facility];  Service: Cardiovascular;  Laterality: N/A;    Short Social History:  Social History   Tobacco Use   Smoking status: Never    Passive exposure: Never   Smokeless tobacco: Never  Substance Use Topics   Alcohol use: Not Currently    Comment: social drinker    Allergies  Allergen Reactions   Apresoline  [Hydralazine ] Nausea And Vomiting and Other (See Comments)    Patient was taking Hydralazine  AND Amlodipine  at the same time, so the reactions came from one of the 2: Lethargy and an all-over feeling of NOT feeling well   Norvasc  [Amlodipine ] Nausea And Vomiting and Other (See Comments)    Patient was taking Amlodipine  AND Hydralazine  at the same time, so the reactions came from one of the 2: Lethargy and an all-over feeling of NOT feeling well     No current facility-administered medications for this encounter.    REVIEW OF  SYSTEMS All other systems were reviewed and are negative     Objective:   Objective   Vitals:   12/21/23 0909 12/21/23 0918  BP: (!) (P) 148/104 (!) 131/92  Pulse: (P) 84 83  Resp: (P) 12 12  SpO2: (P) 100% 100%   There is no height or weight on file to calculate BMI.  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 male with ESRD presenting for fistulogram.  Having issues with low flows and aneurysmal formation.  Last HD session yesterday. Reviewed risks and benefits of fistulogram with intervention and patient agreed to proceed.   Delaney Fearing, MD [facility]

## 2023-12-21 NOTE — Op Note (Signed)
    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 Baranek is a 38 year old male with ESRD who presented to the HD access center for fistulogram.  He been having issues with low flows recently and has had aneurysmal formation.  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  His most recent dialysis session was yesterday.  Recent benefits of fistulogram with intervention were reviewed, he expressed understanding and elected to proceed.  Findings:  Widely patent central venous system.  Severe approximate 80% stenosis of the cephalic vein at the shoulder.  Severe greater than 80% stenosis of the cephalic vein at the [facility] fossa.  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  Widely patent anastomosis.   Procedure:  The patient was identified in the holding area and taken to the cath lab  The patient was then placed supine on the table and prepped and draped in the usual sterile fashion.  A time out was called.  Ultrasound was used to evaluate the left arm AV access. This was accessed under u/s guidance. An 018 wire was advanced without resistance, a micropuncture sheath was placed and fistulagram obtained which demonstrated the above findings.  This access was then upsized to a 5 6 X F  short sheath over a glidewire.  Using the [facility] and a barrier to catheter I was able to cross the lesions.  All of the above lesions were first treated with a 7 mm Mustang balloon.  There was an adequate response in the proximal fistula stenosis although the [facility] fossa stenosis as well as the upper arm cephalic vein stenosis demonstrated significant residual stenosis greater than 50% therefore an 8 mm x 80 mm Athletis balloon was used to treat these 2 segments again with an improved response and approximate 30% residual stenosis.  The wire and sheath were then removed and the access was managed with a figure-of-eight 4-0 Monocryl suture for hemostasis.  Contrast: 50 cc   Impression: Adequate response to balloon angioplasty in multiple segments of the cephalic vein outflow as well as a severe stenosis in the proximal segment of the radiocephalic fistula between the 2 aneurysmal segments.  In the future would likely need revision with an AVG from the proximal not aneurysmal segment of the fistula to the upper arm cephalic vein   Philipp Brawn MD [facility] [place] Office: [phone]

## 2023-12-22 ENCOUNTER — Encounter (HOSPITAL_COMMUNITY): Payer: Self-pay | Admitting: Vascular Surgery

## 2023-12-30 ENCOUNTER — Ambulatory Visit (HOSPITAL_BASED_OUTPATIENT_CLINIC_OR_DEPARTMENT_OTHER): Admitting: Cardiology

## 2023-12-30 ENCOUNTER — Emergency Department (HOSPITAL_COMMUNITY)
Admission: EM | Admit: 2023-12-30 | Discharge: 2023-12-31 | Disposition: A | Discharge status: Home / Self Care | Attending: Emergency Medicine | Admitting: Emergency Medicine

## 2023-12-30 ENCOUNTER — Other Ambulatory Visit: Payer: Self-pay

## 2023-12-30 DIAGNOSIS — N186 End stage renal disease: Secondary | ICD-10-CM | POA: Insufficient documentation

## 2023-12-30 DIAGNOSIS — Z992 Dependence on renal dialysis: Secondary | ICD-10-CM | POA: Diagnosis not present

## 2023-12-30 DIAGNOSIS — Z794 Long term (current) use of insulin: Secondary | ICD-10-CM | POA: Diagnosis not present

## 2023-12-30 DIAGNOSIS — R55 Syncope and collapse: Secondary | ICD-10-CM | POA: Diagnosis present

## 2023-12-30 NOTE — ED Triage Notes (Signed)
 Pt to ED via [facility] from home c/o syncope approx PTA.  Pt states was in recliner when he felt like blood sugar was dropping and got up to get juice.  Felt diaphoretic and weak and placed self on floor where he woke up.  States headache is only pain currently.  Hx of DBM, unsure if accidentally used too much insulin .  Hx of dialysis (fistula L arm) T, TH, Sat but missed yesterday and went today instead and had normal session.  EMS CBG 176 and states BP sitting 148/76 and standing 118/68.

## 2023-12-31 DIAGNOSIS — R55 Syncope and collapse: Secondary | ICD-10-CM | POA: Diagnosis not present

## 2023-12-31 LAB — COMPREHENSIVE METABOLIC PANEL WITH GFR
ALT: 10 U/L (ref 0–44)
AST: 16 U/L (ref 15–41)
Albumin: 3.9 g/dL (ref 3.5–5.0)
Alkaline Phosphatase: 68 U/L (ref 38–126)
Anion gap: 18 — ABNORMAL HIGH (ref 5–15)
BUN: 39 mg/dL — ABNORMAL HIGH (ref 6–20)
CO2: 22 mmol/L (ref 22–32)
Calcium: 7.2 mg/dL — ABNORMAL LOW (ref 8.9–10.3)
Chloride: 96 mmol/L — ABNORMAL LOW (ref 98–111)
Creatinine, Ser: 10.53 mg/dL — ABNORMAL HIGH (ref 0.61–1.24)
GFR, Estimated: 6 mL/min — ABNORMAL LOW (ref >60)
Glucose, Bld: 199 mg/dL — ABNORMAL HIGH (ref 70–99)
Potassium: 3.7 mmol/L (ref 3.5–5.1)
Sodium: 136 mmol/L (ref 135–145)
Total Bilirubin: 0.5 mg/dL (ref 0.0–1.2)
Total Protein: 8.1 g/dL (ref 6.5–8.1)

## 2023-12-31 LAB — CBC
HCT: 37.1 % — ABNORMAL LOW (ref 39.0–52.0)
Hemoglobin: 11.8 g/dL — ABNORMAL LOW (ref 13.0–17.0)
MCH: 27.2 pg (ref 26.0–34.0)
MCHC: 31.8 g/dL (ref 30.0–36.0)
MCV: 85.5 fL (ref 80.0–100.0)
Platelets: 256 K/uL (ref 150–400)
RBC: 4.34 MIL/uL (ref 4.22–5.81)
RDW: 17.2 % — ABNORMAL HIGH (ref 11.5–15.5)
WBC: 6.6 K/uL (ref 4.0–10.5)
nRBC: 0 % (ref 0.0–0.2)

## 2023-12-31 LAB — TROPONIN I (HIGH SENSITIVITY)
Troponin I (High Sensitivity): 32 ng/L — ABNORMAL HIGH (ref <18)
Troponin I (High Sensitivity): 35 ng/L — ABNORMAL HIGH (ref <18)

## 2023-12-31 LAB — CBG MONITORING, ED: Glucose-Capillary: 207 mg/dL — ABNORMAL HIGH (ref 70–99)

## 2023-12-31 NOTE — ED Provider Notes (Signed)
 [place] EMERGENCY DEPARTMENT AT [facility] Provider Note   CSN: [id number] Arrival date & time: 12/30/23  2336     Patient presents with: Loss of Consciousness   [name] is a 38 y.o. male with medical history include hypertrophic cardiomyopathy, seizures, ADHD, ESRD on hemodialysis Tuesday Thursday Saturday.  Patient presents to ED for evaluation of near syncopal event.  The patient reports that about 1 hour prior to arrival he was sitting down in his kitchen when he became lightheaded, nauseous, hot and sweaty.  States that he believes that he took too much insulin .  Reports he is supposed to take 7 units, reports that about 1 hour prior to symptom onset he took his insulin  and believes he took too much due to the amount of clicks that he noticed.  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  He states that this lightheaded sensation, sweatiness lasted until EMS arrived.  He reports that he goes to dialysis on Tuesday Thursday and Saturday.  Reports last session was yesterday due to the fact he missed dialysis on Thursday.  Denies shortness of breath but does state he has a little bit of chest pain.  Denies nausea, vomiting, abdominal pain.  Denies any syncope.  Reports he currently feels lightheaded but denies any other symptoms.    Loss of Consciousness      Prior to Admission medications   Medication Sig Start Date End Date Taking? Authorizing Provider  pantoprazole  (PROTONIX ) 40 MG tablet TAKE 1 TABLET (40 MG TOTAL) BY MOUTH 2 (TWO) TIMES DAILY. 11/03/23 11/02/24 Yes Shirlean Therisa ORN, NP  sodium zirconium cyclosilicate  (LOKELMA ) 10 g PACK packet Take 10 g by mouth See admin instructions. Take 1 packet (10g) once daily on non-dialysis days (Sunday, Monday, Wednesday, Friday)   Yes [provider]  sucralfate  (CARAFATE ) 1 GM/10ML suspension Take 10 mLs (1 g total) by mouth 4 (four) times  daily. 10/26/23  Yes Shirlean Therisa ORN, NP  atorvastatin  (LIPITOR) 40 MG tablet Take 1 tablet (40 mg total) by mouth daily. Patient taking differently: Take 40 mg by mouth daily after supper. 01/18/23 05/08/24  Melvenia Motto, MD  benzonatate  (TESSALON  PERLES) 100 MG capsule Take 1 capsule (100 mg total) by mouth 3 (three) times daily as needed. 11/07/23   Lorren Greig PARAS, NP  cloNIDine  (CATAPRES ) 0.1 MG tablet Take 1 tablet (0.1 mg total) by mouth 2 (two) times daily. 01/18/23   Melvenia Motto, MD  diphenhydramine -acetaminophen  (TYLENOL  PM) 25-500 MG TABS tablet Take 2 tablets by mouth 2 (two) times daily as needed (pain, sleep).    [provider]  insulin  glargine-yfgn (SEMGLEE ) 100 UNIT/ML injection Inject 0.12 mLs (12 Units total) into the skin at bedtime. Patient taking differently: Inject 7 Units into the skin at bedtime. 11/07/23   Lorren Greig PARAS, NP  insulin  lispro (HUMALOG ) 100 UNIT/ML KwikPen Inject 1-7 Units into the skin 3 (three) times daily. Per sliding scale 3 times daily with meals 151-200= 1 unit 201-250= 2 units 251-300= 3 units 301-350= 5 units 351-400= 7 units Greater than 400 call md takes only as needed if blood sugar is over 200 10/24/23   [provider]  labetalol  (NORMODYNE ) 200 MG tablet Take 200 mg by mouth 2 (two) times daily. 10/24/23   [provider]  metoCLOPramide  (REGLAN ) 5 MG tablet Take 5 mg by mouth 4 (four) times daily -  before meals  and at bedtime. 08/02/23   [provider]  minoxidil (LONITEN) 2.5 MG tablet Take 2.5 mg by mouth 2 (two) times daily. 10/24/23   [provider]  Multiple Vitamins-Minerals (MULTIVITAMIN WITH MINERALS) tablet Take 1 tablet by mouth daily.    [provider]    Allergies: Apresoline  [hydralazine ] and Norvasc  [amlodipine ]    Review of Systems  Cardiovascular:  Positive for syncope.  All other systems reviewed and are negative.   Updated Vital Signs BP (!) 144/82   Pulse 68   Temp (!) 97.5  F (36.4 C) (Oral)   Resp 12   Ht 6' (1.829 m)   Wt 90.7 kg   SpO2 100%   BMI 27.12 kg/m   Physical Exam Vitals and nursing note reviewed.  Constitutional:      General: He is not in acute distress.    Appearance: He is well-developed.  HENT:     Head: Normocephalic and atraumatic.   Eyes:     Conjunctiva/sclera: Conjunctivae normal.    Cardiovascular:     Rate and Rhythm: Normal rate and regular rhythm.     Heart sounds: No murmur heard. Pulmonary:     Effort: Pulmonary effort is normal. No respiratory distress.     Breath sounds: Normal breath sounds.  Abdominal:     Palpations: Abdomen is soft.     Tenderness: There is no abdominal tenderness.   Musculoskeletal:        General: No swelling.     Cervical back: Neck supple.   Skin:    General: Skin is warm and dry.     Capillary Refill: Capillary refill takes less than 2 seconds.   Neurological:     Mental Status: He is alert and oriented to person, place, and time. Mental status is at baseline.     Comments: CN III through XII intact.  Intact finger-nose, heel-to-shin.  Equal strength and sensation throughout.  PERRL.  Tracks past midline.  Psychiatric:        Mood and Affect: Mood normal.     (all labs ordered are listed, but only abnormal results are displayed) Labs Reviewed  COMPREHENSIVE METABOLIC PANEL WITH GFR - Abnormal; Notable for the following components:      Result Value   Chloride 96 (*)    Glucose, Bld 199 (*)    BUN 39 (*)    Creatinine, Ser 10.53 (*)    Calcium  7.2 (*)    GFR, Estimated 6 (*)    Anion gap 18 (*)    All other components within normal limits  CBC - Abnormal; Notable for the following components:   Hemoglobin 11.8 (*)    HCT 37.1 (*)    RDW 17.2 (*)    All other components within normal limits  CBG MONITORING, ED - Abnormal; Notable for the following components:   Glucose-Capillary 207 (*)    All other components within normal limits  TROPONIN I (HIGH SENSITIVITY) -  Abnormal; Notable for the following components:   Troponin I (High Sensitivity) 32 (*)    All other components within normal limits  TROPONIN I (HIGH SENSITIVITY) - Abnormal; Notable for the following components:   Troponin I (High Sensitivity) 35 (*)    All other components within normal limits    EKG: None  Radiology: No results found.   Procedures   Medications Ordered in the ED - No data to display  Medical Decision Making Amount and/or Complexity of Data Reviewed Labs: ordered.  38 year old male presents for evaluation.  Please see HPI for further details.  On examination the patient is afebrile and nontachycardic.  Lung sounds are clear bilaterally, he is not hypoxic.  Abdomen is soft and compressible.  Neurological examination is at baseline.  Alert and oriented x 4.  Vital signs reassuring.  Will assess with CBC, CMP, CBG monitoring, troponin x 2, EKG.  Patient CBC without leukocytosis or anemia.  Metabolic panel with BUN 39, glucose 199, creatinine 10.53, GFR 6 and anion gap 18.  Patient ESRD patient.  Patient CBG is 207.  Troponin 32, 35 with delta.  EKG is nonischemic.   At this time, patient workup reassuring.  Patient has had no repeat episodes of near syncope here in the department.  Patient will be discharged home at this time and advised to follow-up with his PCP.  Given return precautions and he voiced understanding.  Stable to discharge   Final diagnoses:  Near syncope    ED Discharge Orders     None          Secily Walthour F, PA-C 12/31/23 0409    Geroldine Berg, MD 12/31/23 [phone]

## 2023-12-31 NOTE — Discharge Instructions (Signed)
 It was a pleasure taking part in your care.  As discussed, your Is reassuring.  Please follow-up with your PCP.  Please also follow-up for regular scheduled dialysis.  Return to the ED with any new or worsening symptoms.

## 2024-02-03 ENCOUNTER — Ambulatory Visit: Attending: Cardiology | Admitting: Cardiology

## 2024-02-06 ENCOUNTER — Ambulatory Visit: Admitting: Family

## 2024-02-10 ENCOUNTER — Other Ambulatory Visit: Payer: Self-pay | Admitting: Family

## 2024-02-10 ENCOUNTER — Other Ambulatory Visit: Payer: Self-pay | Admitting: Gastroenterology

## 2024-02-10 DIAGNOSIS — E1122 Type 2 diabetes mellitus with diabetic chronic kidney disease: Secondary | ICD-10-CM

## 2024-02-13 ENCOUNTER — Other Ambulatory Visit: Payer: Self-pay | Admitting: Gastroenterology

## 2024-02-13 DIAGNOSIS — E1122 Type 2 diabetes mellitus with diabetic chronic kidney disease: Secondary | ICD-10-CM

## 2024-02-13 NOTE — Telephone Encounter (Signed)
 Complete

## 2024-02-17 ENCOUNTER — Ambulatory Visit: Payer: Self-pay | Admitting: Family

## 2024-02-17 ENCOUNTER — Ambulatory Visit (INDEPENDENT_AMBULATORY_CARE_PROVIDER_SITE_OTHER): Admitting: Family

## 2024-02-17 ENCOUNTER — Encounter: Payer: Self-pay | Admitting: Family

## 2024-02-17 VITALS — BP 100/65 | HR 87 | Temp 98.2°F | Resp 16 | Wt 204.0 lb

## 2024-02-17 DIAGNOSIS — I10 Essential (primary) hypertension: Secondary | ICD-10-CM

## 2024-02-17 DIAGNOSIS — M25552 Pain in left hip: Secondary | ICD-10-CM

## 2024-02-17 DIAGNOSIS — R61 Generalized hyperhidrosis: Secondary | ICD-10-CM

## 2024-02-17 DIAGNOSIS — N189 Chronic kidney disease, unspecified: Secondary | ICD-10-CM

## 2024-02-17 DIAGNOSIS — Z794 Long term (current) use of insulin: Secondary | ICD-10-CM

## 2024-02-17 DIAGNOSIS — D170 Benign lipomatous neoplasm of skin and subcutaneous tissue of head, face and neck: Secondary | ICD-10-CM

## 2024-02-17 DIAGNOSIS — E1122 Type 2 diabetes mellitus with diabetic chronic kidney disease: Secondary | ICD-10-CM | POA: Diagnosis not present

## 2024-02-17 DIAGNOSIS — I422 Other hypertrophic cardiomyopathy: Secondary | ICD-10-CM | POA: Diagnosis not present

## 2024-02-17 DIAGNOSIS — M792 Neuralgia and neuritis, unspecified: Secondary | ICD-10-CM

## 2024-02-17 DIAGNOSIS — R0683 Snoring: Secondary | ICD-10-CM

## 2024-02-17 DIAGNOSIS — E119 Type 2 diabetes mellitus without complications: Secondary | ICD-10-CM

## 2024-02-17 DIAGNOSIS — Z0100 Encounter for examination of eyes and vision without abnormal findings: Secondary | ICD-10-CM

## 2024-02-17 DIAGNOSIS — N186 End stage renal disease: Secondary | ICD-10-CM

## 2024-02-17 LAB — POCT GLYCOSYLATED HEMOGLOBIN (HGB A1C): Hemoglobin A1C: 6.6 % — AB (ref 4.0–5.6)

## 2024-02-17 MED ORDER — LABETALOL HCL 200 MG PO TABS: 200.0000 mg | ORAL_TABLET | Freq: Two times a day (BID) | ORAL | 0 refills | Status: DC

## 2024-02-17 MED ORDER — INSULIN LISPRO (1 UNIT DIAL) 100 UNIT/ML (KWIKPEN): PEN_INJECTOR | SUBCUTANEOUS | 2 refills | Status: DC

## 2024-02-17 MED ORDER — CLONIDINE HCL 0.1 MG PO TABS: 0.1000 mg | ORAL_TABLET | Freq: Two times a day (BID) | ORAL | 2 refills | Status: DC

## 2024-02-17 MED ORDER — INSULIN GLARGINE-YFGN 100 UNIT/ML ~~LOC~~ SOPN: PEN_INJECTOR | SUBCUTANEOUS | 2 refills | Status: DC

## 2024-02-17 NOTE — Progress Notes (Signed)
 Patient ID: [name], male    DOB: 10-19-1985  MRN: [id number]  CC: Chronic Conditions Follow-Up  Subjective: [name] is a 38 y.o. male who presents for chronic conditions follow-up.   His concerns today include: - Doing well on Clonidine  and Labetalol , no issues/concerns. He does not complain of red flag symptoms such as but not limited to chest pain, shortness of breath, worst headache of life, nausea/vomiting.  - Doing well on Inuslin Glargine-YFGN and Insulin  Lispro, no issues/concerns. Denies red flag symptoms associated with diabetes.  - Due for diabetic eye exam.  - Due for diabetic foot exam. - Snoring and requests sleep study.  - States since previous office visit he has been unable to schedule appointments with referrals due to his busy schedule.  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) 12/29/2021   Status post surgery 12/29/2021   Hypoglycemia 10/30/2021   Acute hypoxic respiratory failure (HCC) 03/12/2021   ESRD (end stage renal disease) on dialysis (HCC) 03/12/2021   Hypertrophic cardiomyopathy (HCC) 02/26/2021   Syncope 02/08/2021   DKA (diabetic ketoacidosis) (HCC) 02/08/2021   Hyperglycemia    Diabetic foot ulcer (HCC) 11/24/2020   Hypervolemia associated with renal insufficiency 08/07/2020   Uremia 08/07/2020   Metabolic acidosis 08/07/2020   Nausea and vomiting 05/07/2020   Dehydration    Hypertensive urgency 02/25/2020   Anemia  12/02/2019   Essential hypertension 12/02/2019   SOB (shortness of breath) 12/02/2019   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 (Patient taking differently: Take 40 mg by mouth daily after supper.) 30 tablet 2   benzonatate  (TESSALON  PERLES) 100 MG capsule Take 1 capsule (100 mg total) by mouth 3 (three) times daily as needed. 30 capsule 2   diphenhydramine -acetaminophen  (TYLENOL  PM) 25-500 MG TABS tablet Take 2 tablets by mouth 2 (two) times daily as needed (pain, sleep).     insulin  glargine-yfgn (SEMGLEE ) 100 UNIT/ML injection Inject 0.12 mLs (12 Units total) into the skin at bedtime. (Patient taking differently: Inject 7 Units into the skin at bedtime.) 10 mL 2   metoCLOPramide  (REGLAN ) 5 MG tablet Take 5 mg by mouth 4 (four) times daily -  before meals and at bedtime.     minoxidil (LONITEN) 2.5 MG tablet Take 2.5 mg by mouth 2 (two) times daily.     Multiple Vitamins-Minerals (MULTIVITAMIN WITH MINERALS) tablet Take 1 tablet by mouth daily.     pantoprazole  (PROTONIX ) 40 MG tablet TAKE 1 TABLET (40 MG TOTAL) BY MOUTH 2 (TWO) TIMES DAILY. 60 tablet 11   sodium zirconium cyclosilicate  (LOKELMA ) 10 g PACK packet Take 10 g by mouth See admin instructions. Take 1 packet (10g) once daily on non-dialysis days (Sunday, Monday,  Wednesday, Friday)     sucralfate  (CARAFATE ) 1 GM/10ML suspension TAKE 10 MLS (1 G TOTAL) BY MOUTH 4 (FOUR) TIMES DAILY. 420 mL 10   SURE COMFORT PEN NEEDLES 31G X 8 MM MISC USE AS DIRECTED WITH INSULIN  PENS FOR INJECTION 100 each 10   No current facility-administered medications on file prior to visit.    Allergies  Allergen Reactions   Apresoline  [Hydralazine ] Nausea And  Vomiting and Other (See Comments)    Patient was taking Hydralazine  AND Amlodipine  at the same time, so the reactions came from one of the 2: Lethargy and an all-over feeling of NOT feeling well   Norvasc  [Amlodipine ] Nausea And Vomiting and Other (See Comments)    Patient was taking Amlodipine  AND Hydralazine  at the same time, so the reactions came from one of the 2: Lethargy and an all-over feeling of NOT feeling well     Social History   Socioeconomic History   Marital status: Single    Spouse name: Not on file   Number of children: Not on file   Years of education: Not on file   Highest education level: Not on file  Occupational History   Occupation: disabled  Tobacco Use   Smoking status: Never    Passive exposure: Never   Smokeless tobacco: Never  Vaping Use   Vaping status: Never Used  Substance and Sexual Activity   Alcohol use: Not Currently    Comment: social drinker   Drug use: No   Sexual activity: Yes  Other Topics Concern   Not on file  Social History Narrative   Not on file   Social Drivers of Health   Financial Resource Strain: Low Risk  (11/07/2023)   Overall Financial Resource Strain (CARDIA)    Difficulty of Paying Living Expenses: Not hard at all  Food Insecurity: No Food Insecurity (11/10/2023)   Hunger Vital Sign    Worried About Running Out of Food in the Last Year: Never true    Ran Out of Food in the Last Year: Never true  Transportation Needs: No Transportation Needs (11/10/2023)   PRAPARE - Administrator, Civil Service (Medical): No    Lack of Transportation (Non-Medical): No  Physical Activity: Inactive (11/07/2023)   Exercise Vital Sign    Days of Exercise per Week: 0 days    Minutes of Exercise per Session: 0 min  Stress: No Stress Concern Present (11/07/2023)   [employer] of Occupational Health - Occupational Stress Questionnaire    Feeling of Stress : Only a little  Social Connections: Socially Isolated (11/10/2023)    Social Connection and Isolation Panel    Frequency of Communication with Friends and Family: More than three times a week    Frequency of Social Gatherings with Friends and Family: More than three times a week    Attends Religious Services: Never    Database administrator or Organizations: No    Attends Banker Meetings: Never    Marital Status: Never married  Intimate Partner Violence: Not At Risk (11/10/2023)   Humiliation, Afraid, Rape, and Kick questionnaire    Fear of Current or Ex-Partner: No    Emotionally Abused: No    Physically Abused: No    Sexually Abused: No    Family History  Problem Relation Age of Onset   Cancer Mother    Stroke Father    Diabetes Father    Hypertension Father     Past Surgical History:  Procedure  Laterality Date   A/V SHUNT INTERVENTION N/A 12/21/2023   Procedure: A/V SHUNT INTERVENTION;  Surgeon: Pearline Norman RAMAN, MD;  Location: [facility];  Service: Cardiovascular;  Laterality: N/A;   AMPUTATION TOE Right 12/29/2021   Procedure: AMPUTATION TOE, fifth;  Surgeon: Joya Stabs, DPM;  Location: [facility];  Service: Podiatry;  Laterality: Right;  surgical team will do local block   AV FISTULA PLACEMENT Left 08/11/2020   Procedure: LEFT UPPER EXTREMITY ARTERIOVENOUS (AV) FISTULA CREATION;  Surgeon: Magda Debby SAILOR, MD;  Location[facility] OR;  Service: Vascular;  Laterality: Left;   BIOPSY  08/04/2023   Procedure: BIOPSY;  Surgeon: Cinderella Deatrice FALCON, MD;  [facility];  Service: Endoscopy;;   ESOPHAGOGASTRODUODENOSCOPY (EGD) WITH PROPOFOL  N/A 08/04/2023   Procedure: ESOPHAGOGASTRODUODENOSCOPY (EGD) WITH PROPOFOL ;  Surgeon: Cinderella Deatrice FALCON, MD;  [facility];  Service: Endoscopy;  Laterality: N/A;   FISTULA SUPERFICIALIZATION Left 12/02/2021   Procedure: PLICATION OF LEFT RADIUS CEPHALIC FISTULA;  Surgeon: Magda Debby SAILOR, MD;  Location: [facility] OR;  Service: Vascular;  Laterality: Left;  PERIPHERAL NERVE BLOCK   FRACTURE SURGERY      I & D EXTREMITY Left 07/16/2014   Procedure: IRRIGATION AND DEBRIDEMENT EXTREMITY/LEFT INDEX FINGER;  Surgeon: Franky Curia, MD;  Location: [facility];  Service: Orthopedics;  Laterality: Left;   IR FLUORO GUIDE CV LINE RIGHT  10/04/2022   IR FLUORO GUIDE CV LINE RIGHT  10/05/2022   IR PERC TUN PERIT CATH WO PORT S&I /IMAG  08/07/2020   IR REMOVAL TUN CV CATH W/O FL  10/27/2022   IR THROMBECTOMY AV FISTULA W/THROMBOLYSIS/PTA INC/SHUNT/IMG LEFT Left 10/05/2022   IR US  GUIDE VASC ACCESS RIGHT  08/07/2020   IR US  GUIDE VASC ACCESS RIGHT  10/04/2022   IR US  GUIDE VASC ACCESS RIGHT  10/05/2022   TEE WITHOUT CARDIOVERSION N/A 10/28/2022   Procedure: TRANSESOPHAGEAL ECHOCARDIOGRAM;  Surgeon: Sheena Pugh, DO;  [facility];  Service: Cardiovascular;  Laterality: N/A;   VENOUS ANGIOPLASTY Left 12/21/2023   Procedure: VENOUS ANGIOPLASTY;  Surgeon: Pearline Norman RAMAN, MD;  Location: [facility];  Service: Cardiovascular;  Laterality: Left;    ROS: Review of Systems Negative except as stated above  PHYSICAL EXAM: BP 100/65   Pulse 87   Temp 98.2 F (36.8 C) (Oral)   Resp 16   Wt 204 lb (92.5 kg)   SpO2 97%   BMI 27.67 kg/m   Physical Exam HENT:     Head: Normocephalic and atraumatic.     Nose: Nose normal.     Mouth/Throat:     Mouth: Mucous membranes are moist.     Pharynx: Oropharynx is clear.  Eyes:     Extraocular Movements: Extraocular movements intact.     Conjunctiva/sclera: Conjunctivae normal.     Pupils: Pupils are equal, round, and reactive to light.  Cardiovascular:     Rate and Rhythm: Normal rate and regular rhythm.     Pulses: Normal pulses.     Heart sounds: Normal heart sounds.  Pulmonary:     Effort: Pulmonary effort is normal.     Breath sounds: Normal breath sounds.  Musculoskeletal:        General: Normal range of motion.     Cervical back: Normal range of motion and neck supple.  Neurological:     General: No focal deficit present.     Mental Status: He  is alert and oriented to person, place, and time.  Psychiatric:  Mood and Affect: Mood normal.        Behavior: Behavior normal.     Results for orders placed or performed in visit on 02/17/24  HgB A1c  Result Value Ref Range   Hemoglobin A1C 6.6 (A) 4.0 - 5.6 %   HbA1c POC (<> result, manual entry)     HbA1c, POC (prediabetic range)     HbA1c, POC (controlled diabetic range)      ASSESSMENT AND PLAN: 1. Primary hypertension (Primary) 2. Hypertrophic cardiomyopathy (HCC) - Continue Clonidine  and Labetalol  as prescribed.  - Counseled on blood pressure goal of less than 130/80, low-sodium, DASH diet, medication compliance, and 150 minutes of moderate intensity exercise per week as tolerated. Counseled on medication adherence and adverse effects. - Referral to Cardiology for evaluation/management.  - Follow-up with primary provider in 3 months or sooner if needed. - cloNIDine  (CATAPRES ) 0.1 MG tablet; Take 1 tablet (0.1 mg total) by mouth 2 (two) times daily.  Dispense: 60 tablet; Refill: 2 - labetalol  (NORMODYNE ) 200 MG tablet; Take 1 tablet (200 mg total) by mouth 2 (two) times daily.  Dispense: 180 tablet; Refill: 0 - Ambulatory referral to Cardiology  3. 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  - Continue Insulin  Glargine-YFGN and Insulin  Lispro as prescribed.  - Discussed the importance of healthy eating habits, low-carbohydrate diet, low-sugar diet, regular aerobic exercise (at least 150 minutes a week as tolerated) and medication compliance to achieve or maintain control of diabetes. Counseled on medication adherence/adverse effects.  - Follow-up with primary provider in 3 months or sooner if needed. - HgB A1c - insulin  glargine-yfgn (SEMGLEE ) 100 UNIT/ML Pen; INJECT 0.12 MLS (12 UNITS TOTAL) INTO THE SKIN AT BEDTIME.  Dispense: 10 mL; Refill: 2 - insulin  lispro (HUMALOG ) 100 UNIT/ML KwikPen; INJECT  PER SLIDING SCALE 3 TIMES DAILY WITH MEALS IF GLUCOSE IS 151-200=1 UNIT,201-250=2U, 251-300=3U,301-350=5U, 351-400=7U. CALL MD IF >400  Dispense: 10 mL; Refill: 2  4. Diabetic eye exam [facility]) - Referral to Ophthalmology for evaluation/management. - Ambulatory referral to Ophthalmology  5. Encounter for diabetic foot exam [facility]) - Referral to Podiatry for evaluation/management. - Ambulatory referral to Podiatry  6. Chronic kidney disease, unspecified CKD stage - Keep all scheduled appointments with established dialysis.  - Referral to Nephrology for evaluation/management.  - Ambulatory referral to Nephrology  7. Neuropathic pain 8. Pain of left hip - Referral to Orthopedic Surgery and Physical Therapy for evaluation/management. - Ambulatory referral to Orthopedic Surgery - Ambulatory referral to Physical Therapy  9. Lipoma of neck - Referral to Dermatology for evaluation/management. - Ambulatory referral to Dermatology  10. Excessive sweating - Referral to Dermatology for evaluation/management. - Ambulatory referral to Dermatology  11. Snoring - Routine screening.  - PSG Sleep Study; Future    Patient was given the opportunity to ask questions.  Patient verbalized understanding of the plan and was able to repeat key elements of the plan. Patient was given clear instructions to go to Emergency Department or return to medical center if symptoms don't improve, worsen, or new problems develop.The patient verbalized understanding.   Orders Placed This Encounter  Procedures   Ambulatory referral to Ophthalmology   Ambulatory referral to Podiatry   Ambulatory referral to Cardiology   Ambulatory referral to Nephrology   Ambulatory referral to Orthopedic Surgery   Ambulatory referral to Dermatology   Ambulatory referral to Physical Therapy   HgB A1c   PSG Sleep  Study     Requested Prescriptions   Signed Prescriptions Disp Refills   cloNIDine  (CATAPRES ) 0.1 MG tablet 60  tablet 2    Sig: Take 1 tablet (0.1 mg total) by mouth 2 (two) times daily.   insulin  glargine-yfgn (SEMGLEE ) 100 UNIT/ML Pen 10 mL 2    Sig: INJECT 0.12 MLS (12 UNITS TOTAL) INTO THE SKIN AT BEDTIME.   insulin  lispro (HUMALOG ) 100 UNIT/ML KwikPen 10 mL 2    Sig: INJECT PER SLIDING SCALE 3 TIMES DAILY WITH MEALS IF GLUCOSE IS 151-200=1 UNIT,201-250=2U, 251-300=3U,301-350=5U, 351-400=7U. CALL MD IF >400   labetalol  (NORMODYNE ) 200 MG tablet 180 tablet 0    Sig: Take 1 tablet (200 mg total) by mouth 2 (two) times daily.    Follow-up with primary provider as scheduled.   Greig JINNY Drones, NP

## 2024-03-14 ENCOUNTER — Ambulatory Visit

## 2024-03-28 ENCOUNTER — Ambulatory Visit: Admitting: Podiatry

## 2024-03-28 ENCOUNTER — Other Ambulatory Visit: Payer: Self-pay | Admitting: Family

## 2024-03-28 DIAGNOSIS — E1122 Type 2 diabetes mellitus with diabetic chronic kidney disease: Secondary | ICD-10-CM

## 2024-03-28 NOTE — Telephone Encounter (Signed)
 Complete

## 2024-04-11 ENCOUNTER — Ambulatory Visit: Admitting: Podiatry

## 2024-04-20 ENCOUNTER — Encounter: Payer: Self-pay | Admitting: Cardiology

## 2024-04-24 ENCOUNTER — Other Ambulatory Visit: Payer: Self-pay | Admitting: Family

## 2024-04-25 ENCOUNTER — Encounter (HOSPITAL_COMMUNITY)
Admission: RE | Disposition: A | Discharge status: Home / Self Care | Payer: Self-pay | Source: Home / Self Care | Attending: Vascular Surgery

## 2024-04-25 ENCOUNTER — Ambulatory Visit (HOSPITAL_COMMUNITY)
Admission: RE | Admit: 2024-04-25 | Discharge: 2024-04-25 | Disposition: A | Discharge status: Home / Self Care | Attending: Vascular Surgery | Admitting: Vascular Surgery

## 2024-04-25 ENCOUNTER — Other Ambulatory Visit: Payer: Self-pay

## 2024-04-25 DIAGNOSIS — N186 End stage renal disease: Secondary | ICD-10-CM | POA: Diagnosis not present

## 2024-04-25 DIAGNOSIS — I120 Hypertensive chronic kidney disease with stage 5 chronic kidney disease or end stage renal disease: Secondary | ICD-10-CM | POA: Diagnosis present

## 2024-04-25 DIAGNOSIS — E1122 Type 2 diabetes mellitus with diabetic chronic kidney disease: Secondary | ICD-10-CM | POA: Diagnosis not present

## 2024-04-25 DIAGNOSIS — Z992 Dependence on renal dialysis: Secondary | ICD-10-CM | POA: Insufficient documentation

## 2024-04-25 HISTORY — PX: DIALYSIS/PERMA CATHETER INSERTION: CATH118288

## 2024-04-25 LAB — GLUCOSE, CAPILLARY: Glucose-Capillary: 149 mg/dL — ABNORMAL HIGH (ref 70–99)

## 2024-04-25 SURGERY — DIALYSIS/PERMA CATHETER INSERTION
Anesthesia: LOCAL | Site: Arm Lower | Laterality: Right

## 2024-04-25 MED ORDER — HEPARIN SODIUM (PORCINE) 1000 UNIT/ML IJ SOLN
INTRAMUSCULAR | Status: AC
  Filled 2024-04-25: qty 10

## 2024-04-25 MED ORDER — HEPARIN (PORCINE) IN NACL 1000-0.9 UT/500ML-% IV SOLN
INTRAVENOUS | Status: DC | PRN
  Administered 2024-04-25: 500 mL

## 2024-04-25 MED ORDER — LIDOCAINE HCL (PF) 1 % IJ SOLN
INTRAMUSCULAR | Status: DC | PRN
  Administered 2024-04-25: 5 mL via SUBCUTANEOUS
  Administered 2024-04-25: 10 mL via SUBCUTANEOUS

## 2024-04-25 MED ORDER — HEPARIN SODIUM (PORCINE) 1000 UNIT/ML IJ SOLN
INTRAMUSCULAR | Status: DC | PRN
  Administered 2024-04-25 (×2): 1600 [IU] via INTRAVENOUS

## 2024-04-25 MED ORDER — LIDOCAINE HCL (PF) 1 % IJ SOLN
INTRAMUSCULAR | Status: AC
  Filled 2024-04-25: qty 30

## 2024-04-25 SURGICAL SUPPLY — 4 items
CATH PALINDROME-P 19CM W/VT (CATHETERS) IMPLANT
KIT MICROPUNCTURE NIT STIFF (SHEATH) IMPLANT
SHEATH PROBE COVER 6X72 (BAG) IMPLANT
TRAY PV CATH (CUSTOM PROCEDURE TRAY) ×3 IMPLANT

## 2024-04-25 NOTE — Telephone Encounter (Signed)
 Complete

## 2024-04-25 NOTE — H&P (Signed)
 HD ACCESS CENTER H&P   Patient ID: [name], male   DOB: 1985-07-24, 38 y.o.   MRN: [id number]  Subjective:     HPI [name] is a 38 y.o. male with ESRD presenting to the HD access center for intervention.  Past Medical History:  Diagnosis Date   ADHD (attention deficit hyperactivity disorder)    Diabetes mellitus    ESRD on hemodialysis (HCC)    [facility]   High cholesterol    Hypertension    Hypertrophic cardiomyopathy (HCC) 02/26/2021   Seizure (HCC) 02/08/2021   Family History  Problem Relation Age of Onset   Cancer Mother    Stroke Father    Diabetes Father    Hypertension Father    Past Surgical History:  Procedure Laterality Date   A/V SHUNT INTERVENTION N/A 12/21/2023   Procedure: A/V SHUNT INTERVENTION;  Surgeon: Pearline Norman RAMAN, MD;  Location: [facility];  Service: Cardiovascular;  Laterality: N/A;   AMPUTATION TOE Right 12/29/2021   Procedure: AMPUTATION TOE, fifth;  Surgeon: Joya Stabs, DPM;  Location[facility];  Service: Podiatry;  Laterality: Right;  surgical team will do local block   AV FISTULA PLACEMENT Left 08/11/2020   Procedure: LEFT UPPER EXTREMITY ARTERIOVENOUS (AV) FISTULA CREATION;  Surgeon: Magda Debby SAILOR, MD;  Location: [facility] OR;  Service: Vascular;  Laterality: Left;   BIOPSY  08/04/2023   Procedure: BIOPSY;  Surgeon: Cinderella Deatrice FALCON, MD;  [facility];  Service: Endoscopy;;   ESOPHAGOGASTRODUODENOSCOPY (EGD) WITH PROPOFOL  N/A 08/04/2023   Procedure: ESOPHAGOGASTRODUODENOSCOPY (EGD) WITH PROPOFOL ;  Surgeon: Cinderella Deatrice FALCON, MD;  [facility];  Service: Endoscopy;  Laterality: N/A;   FISTULA SUPERFICIALIZATION Left 12/02/2021   Procedure: PLICATION OF LEFT RADIUS CEPHALIC FISTULA;  Surgeon: Magda Debby SAILOR, MD;  Location: [facility] OR;  Service: Vascular;  Laterality: Left;  PERIPHERAL NERVE BLOCK   FRACTURE SURGERY     I & D EXTREMITY Left 07/16/2014   Procedure: IRRIGATION AND DEBRIDEMENT EXTREMITY/LEFT  INDEX FINGER;  Surgeon: Franky Curia, MD;  Location[facility];  Service: Orthopedics;  Laterality: Left;   IR FLUORO GUIDE CV LINE RIGHT  10/04/2022   IR FLUORO GUIDE CV LINE RIGHT  10/05/2022   IR PERC TUN PERIT CATH WO PORT S&I /IMAG  08/07/2020   IR REMOVAL TUN CV CATH W/O FL  10/27/2022   IR THROMBECTOMY AV FISTULA W/THROMBOLYSIS/PTA INC/SHUNT/IMG LEFT Left 10/05/2022   IR US  GUIDE VASC ACCESS RIGHT  08/07/2020   IR US  GUIDE VASC ACCESS RIGHT  10/04/2022   IR US  GUIDE VASC ACCESS RIGHT  10/05/2022   TEE WITHOUT CARDIOVERSION N/A 10/28/2022   Procedure: TRANSESOPHAGEAL ECHOCARDIOGRAM;  Surgeon: Sheena Pugh, DO;  [facility];  Service: Cardiovascular;  Laterality: N/A;   VENOUS ANGIOPLASTY Left 12/21/2023   Procedure: VENOUS ANGIOPLASTY;  Surgeon: Pearline Norman RAMAN, MD;  Location: [facility];  Service: Cardiovascular;  Laterality: Left;    Short Social History:  Social History   Tobacco Use   Smoking status: Never    Passive exposure: Never   Smokeless tobacco: Never  Substance Use Topics   Alcohol use: Not Currently    Comment: social drinker    Allergies  Allergen Reactions   Apresoline  [Hydralazine ] Nausea And Vomiting and Other (See Comments)    Patient was taking Hydralazine  AND Amlodipine  at the same time, so the reactions came from one of the 2: Lethargy and an all-over feeling of NOT feeling well  Norvasc  [Amlodipine ] Nausea And Vomiting and Other (See Comments)    Patient was taking Amlodipine  AND Hydralazine  at the same time, so the reactions came from one of the 2: Lethargy and an all-over feeling of NOT feeling well     No current facility-administered medications for this encounter.    REVIEW OF SYSTEMS All other systems were reviewed and are negative     Objective:   Objective   Vitals:   04/25/24 1005 04/25/24 1019  BP: (!) 178/115 (!) 161/104  Pulse: 73 75  Resp: (!) 73 14  Temp: 98 F (36.7 C)   TempSrc: Oral   SpO2: 96% 95%   There is no  height or weight on file to calculate BMI.  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  Data: Reviewed fistulogram from June.  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  This area was treated with a 7 mm Mustang.  The upper arm cephalic vein was treated with an 8 mm Athletis     Assessment/Plan:   [name] is a 37 y.o. male with ESRD presenting for thrombectomy versus tunneled dialysis catheter placement.  Having issues with possible clotted access. Reviewed risks and benefits of tunneled dialysis catheter placement, possible thrombectomy and patient agreed to proceed.   Norman Serve, MD [facility]

## 2024-04-25 NOTE — Op Note (Signed)
    Patient name: CASMER YEPIZ MRN: [id number] DOB: [birth year] Sex: male  04/25/2024 Pre-operative Diagnosis: ESRD on HD Post-operative diagnosis:  Same Surgeon:  Norman GORMAN Serve, MD Procedure Performed:   Ultrasound and fluoroscopic guided right internal jugular TDC placement, 19cm palindrome  Indications: Mr. Enns is a 38 year old male with ESRD on HD who has a left forearm radiocephalic fistula.  He was last intervened on in June where he was noted to have 2 significantly sized aneurysmal segments with an intervening segment that was severely stenosed.  This was ballooned.  He now presents to the HD access center today due to a clotted fistula.  I explained that given his years and then severe stenosis that was treated in June it is very unlikely that a thrombectomy would be successful and therefore I recommended placement of a tunneled dialysis catheter placement.  Risk benefits were reviewed and he elected to proceed.  Findings:  Patent and compressible right IJ. Successful placement of right IJ TDC at atriocaval junction.   Procedure:   The patient was brought to the operating room positioned supine on operating room table.  The neck was prepped and draped in the usual sterile fashion. Using ultrasound guidance the right internal jugular vein was accessed with micropuncture technique.  Through the micropuncture sheath, the guidewire was advanced into the superior vena cava.  A small incision was made around the skin access point.  The access point was serially dilated under direct fluoroscopic guidance.  A peel-away sheath was introduced into the superior vena cava under fluoroscopic guidance.  A counterincision was made in the chest under the clavicle.  A 19 cm tunnel dialysis catheter was then tunneled under the skin, over the clavicle into the incision in the neck.  The tunneling device was removed and the catheter fed through the peel-away sheath into the superior vena cava.  The  peel-away sheath was removed and the catheter gently pulled back.  Adequate position was confirmed with x-ray.  The catheter was tested and found to aspirate and flush with ease.    The catheter was sutured to the skin and the neck incision was closed with 4-0 Monocryl.  The catheter was then capped and heparin  locked.   Contrast: none  Sedation: none   Impression: Successful placement of right IJ TDC.   Norman GORMAN Serve MD [facility] Office: [phone]

## 2024-04-26 ENCOUNTER — Encounter (HOSPITAL_COMMUNITY): Payer: Self-pay | Admitting: Vascular Surgery

## 2024-04-27 ENCOUNTER — Other Ambulatory Visit: Payer: Self-pay

## 2024-04-27 DIAGNOSIS — Z992 Dependence on renal dialysis: Secondary | ICD-10-CM

## 2024-05-17 ENCOUNTER — Telehealth: Payer: Self-pay | Admitting: Family

## 2024-05-17 NOTE — Telephone Encounter (Signed)
 A document form from [employer] has been faxed: Personal Care Services request, to be filled out by provider. Send document back via Fax within 7-days to 14 days. Document is located in providers tray at front office.          Fax number: [phone] [employer]

## 2024-05-23 ENCOUNTER — Other Ambulatory Visit: Payer: Self-pay

## 2024-05-23 ENCOUNTER — Encounter: Payer: Self-pay | Admitting: Vascular Surgery

## 2024-05-23 ENCOUNTER — Ambulatory Visit (HOSPITAL_BASED_OUTPATIENT_CLINIC_OR_DEPARTMENT_OTHER)
Admission: RE | Admit: 2024-05-23 | Discharge: 2024-05-23 | Disposition: A | Discharge status: Home / Self Care | Source: Ambulatory Visit | Attending: Surgery | Admitting: Surgery

## 2024-05-23 ENCOUNTER — Ambulatory Visit (HOSPITAL_COMMUNITY)
Admission: RE | Admit: 2024-05-23 | Discharge: 2024-05-23 | Disposition: A | Discharge status: Home / Self Care | Source: Ambulatory Visit | Attending: Surgery | Admitting: Surgery

## 2024-05-23 ENCOUNTER — Ambulatory Visit (INDEPENDENT_AMBULATORY_CARE_PROVIDER_SITE_OTHER): Admitting: Vascular Surgery

## 2024-05-23 VITALS — BP 131/93 | HR 89 | Temp 97.8°F

## 2024-05-23 DIAGNOSIS — N186 End stage renal disease: Secondary | ICD-10-CM | POA: Insufficient documentation

## 2024-05-23 DIAGNOSIS — Z992 Dependence on renal dialysis: Secondary | ICD-10-CM

## 2024-05-23 NOTE — Progress Notes (Signed)
 Patient ID: [name], male   DOB: 1985/11/17, 38 y.o.   MRN: [id number]  Reason for Consult: Follow-up   Referred by Jaycee Greig PARAS, NP  Subjective:     HPI:  [name] is a 38 y.o. male history of end-stage renal disease has been on dialysis 3 years ago left forearm AV fistula and now on dialysis via catheter on Tuesdays, Thursdays and Saturdays.  He does not take any blood thinners.  He has not had any upper extremity procedures other than balloon angioplasty of the cephalic vein in the past.  He is currently on a transplant list.  He is a single father with a 15-year-old son.  He is legally blind.  Past Medical History:  Diagnosis Date   ADHD (attention deficit hyperactivity disorder)    Diabetes mellitus    ESRD on hemodialysis (HCC)    [facility]   High cholesterol    Hypertension    Hypertrophic cardiomyopathy (HCC) 02/26/2021   Seizure (HCC) 02/08/2021   Family History  Problem Relation Age of Onset   Cancer Mother    Stroke Father    Diabetes Father    Hypertension Father    Past Surgical History:  Procedure Laterality Date   A/V SHUNT INTERVENTION N/A 12/21/2023   Procedure: A/V SHUNT INTERVENTION;  Surgeon: Pearline Norman RAMAN, MD;  Location: [facility];  Service: Cardiovascular;  Laterality: N/A;   AMPUTATION TOE Right 12/29/2021   Procedure: AMPUTATION TOE, fifth;  Surgeon: Joya Stabs, DPM;  Location: [facility];  Service: Podiatry;  Laterality: Right;  surgical team will do local block   AV FISTULA PLACEMENT Left 08/11/2020   Procedure: LEFT UPPER EXTREMITY ARTERIOVENOUS (AV) FISTULA CREATION;  Surgeon: Magda Debby SAILOR, MD;  Location: [facility] OR;  Service: Vascular;  Laterality: Left;   BIOPSY  08/04/2023   Procedure: BIOPSY;  Surgeon: Cinderella Deatrice FALCON, MD;  Location: [facility];  Service: Endoscopy;;   DIALYSIS/PERMA CATHETER INSERTION Right 04/25/2024   Procedure: DIALYSIS/PERMA CATHETER INSERTION;  Surgeon: Pearline Norman RAMAN, MD;  Location:  [facility];  Service: Cardiovascular;  Laterality: Right;   ESOPHAGOGASTRODUODENOSCOPY (EGD) WITH PROPOFOL  N/A 08/04/2023   Procedure: ESOPHAGOGASTRODUODENOSCOPY (EGD) WITH PROPOFOL ;  Surgeon: Cinderella Deatrice FALCON, MD;  Location: [facility];  Service: Endoscopy;  Laterality: N/A;   FISTULA SUPERFICIALIZATION Left 12/02/2021   Procedure: PLICATION OF LEFT RADIUS CEPHALIC FISTULA;  Surgeon: Magda Debby SAILOR, MD;  Location: [facility];  Service: Vascular;  Laterality: Left;  PERIPHERAL NERVE BLOCK   FRACTURE SURGERY     I & D EXTREMITY Left 07/16/2014   Procedure: IRRIGATION AND DEBRIDEMENT EXTREMITY/LEFT INDEX FINGER;  Surgeon: Franky Curia, MD;  Location: [facility];  Service: Orthopedics;  Laterality: Left;   IR FLUORO GUIDE CV LINE RIGHT  10/04/2022   IR FLUORO GUIDE CV LINE RIGHT  10/05/2022   IR PERC TUN PERIT CATH WO PORT S&I /IMAG  08/07/2020   IR REMOVAL TUN CV CATH W/O FL  10/27/2022   IR THROMBECTOMY AV FISTULA W/THROMBOLYSIS/PTA INC/SHUNT/IMG LEFT Left 10/05/2022   IR US  GUIDE VASC ACCESS RIGHT  08/07/2020   IR US  GUIDE VASC ACCESS RIGHT  10/04/2022   IR US  GUIDE VASC ACCESS RIGHT  10/05/2022   TEE WITHOUT CARDIOVERSION N/A 10/28/2022   Procedure: TRANSESOPHAGEAL ECHOCARDIOGRAM;  Surgeon: Sheena Pugh, DO;  Location: [facility];  Service: Cardiovascular;  Laterality: N/A;   VENOUS ANGIOPLASTY Left 12/21/2023   Procedure: VENOUS ANGIOPLASTY;  Surgeon: Pearline Norman  S, MD;  Location: [facility];  Service: Cardiovascular;  Laterality: Left;    Short Social History:  Social History   Tobacco Use   Smoking status: Never    Passive exposure: Never   Smokeless tobacco: Never  Substance Use Topics   Alcohol use: Not Currently    Comment: social drinker    Allergies  Allergen Reactions   Apresoline  [Hydralazine ] Nausea And Vomiting and Other (See Comments)    Patient was taking Hydralazine  AND Amlodipine  at the same time, so the reactions came from one of the 2: Lethargy and an all-over feeling  of NOT feeling well   Norvasc  [Amlodipine ] Nausea And Vomiting and Other (See Comments)    Patient was taking Amlodipine  AND Hydralazine  at the same time, so the reactions came from one of the 2: Lethargy and an all-over feeling of NOT feeling well     Current Outpatient Medications  Medication Sig Dispense Refill   atorvastatin  (LIPITOR) 40 MG tablet Take 1 tablet (40 mg total) by mouth daily. (Patient taking differently: Take 40 mg by mouth daily after supper.) 30 tablet 2   benzonatate  (TESSALON  PERLES) 100 MG capsule Take 1 capsule (100 mg total) by mouth 3 (three) times daily as needed. 30 capsule 2   cloNIDine  (CATAPRES ) 0.1 MG tablet Take 1 tablet (0.1 mg total) by mouth 2 (two) times daily. 60 tablet 2   diphenhydramine -acetaminophen  (TYLENOL  PM) 25-500 MG TABS tablet Take 2 tablets by mouth 2 (two) times daily as needed (pain, sleep).     insulin  glargine (LANTUS  SOLOSTAR) 100 UNIT/ML Solostar Pen INJECT 12 UNITS INTO THE SKIN AT BEDTIME 15 mL 1   insulin  glargine-yfgn (SEMGLEE ) 100 UNIT/ML injection Inject 0.12 mLs (12 Units total) into the skin at bedtime. (Patient taking differently: Inject 7 Units into the skin at bedtime.) 10 mL 2   insulin  glargine-yfgn (SEMGLEE ) 100 UNIT/ML Pen INJECT 0.12 MLS (12 UNITS TOTAL) INTO THE SKIN AT BEDTIME. 10 mL 2   insulin  lispro (HUMALOG ) 100 UNIT/ML KwikPen INJECT PER SLIDING SCALE 3 TIMES DAILY WITH MEALS IF GLUCOSE IS 151-200=1 UNIT,201-250=2U, 251-300=3U,301-350=5U, 351-400=7U. CALL MD IF >400 15 mL 10   labetalol  (NORMODYNE ) 200 MG tablet Take 1 tablet (200 mg total) by mouth 2 (two) times daily. 180 tablet 0   metoCLOPramide  (REGLAN ) 5 MG tablet Take 5 mg by mouth 4 (four) times daily -  before meals and at bedtime.     minoxidil (LONITEN) 2.5 MG tablet Take 2.5 mg by mouth 2 (two) times daily.     Multiple Vitamins-Minerals (MULTIVITAMIN WITH MINERALS) tablet Take 1 tablet by mouth daily.     pantoprazole  (PROTONIX ) 40 MG tablet TAKE 1 TABLET  (40 MG TOTAL) BY MOUTH 2 (TWO) TIMES DAILY. 60 tablet 11   sodium zirconium cyclosilicate  (LOKELMA ) 10 g PACK packet Take 10 g by mouth See admin instructions. Take 1 packet (10g) once daily on non-dialysis days (Sunday, Monday, Wednesday, Friday)     sucralfate  (CARAFATE ) 1 GM/10ML suspension TAKE 10 MLS (1 G TOTAL) BY MOUTH 4 (FOUR) TIMES DAILY. 420 mL 10   SURE COMFORT PEN NEEDLES 31G X 8 MM MISC USE AS DIRECTED WITH INSULIN  PENS FOR INJECTION 100 each 10   No current facility-administered medications for this visit.    Review of Systems  Constitutional:  Constitutional negative. HENT: HENT negative.  Eyes: Positive for loss of vision.   Respiratory: Respiratory negative.  Cardiovascular: Cardiovascular negative.  GI: Gastrointestinal negative.  Musculoskeletal: Musculoskeletal negative.  Skin:  Skin negative.  Hematologic: Hematologic/lymphatic negative.  Psychiatric: Psychiatric negative.        Objective:  Objective   Vitals:   05/23/24 1146  BP: (!) 131/93  Pulse: 89  Temp: 97.8 F (36.6 C)  SpO2: 97%   There is no height or weight on file to calculate BMI.  Physical Exam HENT:     Head: Normocephalic.     Nose: Nose normal.  Eyes:     Pupils: Pupils are equal, round, and reactive to light.  Cardiovascular:     Pulses:          Radial pulses are 2+ on the right side and 2+ on the left side.  Pulmonary:     Effort: Pulmonary effort is normal.  Abdominal:     General: Abdomen is flat.  Musculoskeletal:     Comments: Large pseudoaneurysms left forearm  Neurological:     General: No focal deficit present.     Mental Status: He is alert.     Data: Right Pre-Dialysis Findings:  +-----------------------+----------+--------------------+---------+--------  +  Location              PSV (cm/s)Intralum. Diam. (cm)Waveform  Comments  +-----------------------+----------+--------------------+---------+--------  +  Brachial Antecub. fossa54        0.52                 triphasic           +-----------------------+----------+--------------------+---------+--------  +  Radial Art at Wrist    39        0.31                triphasic           +-----------------------+----------+--------------------+---------+--------  +  Ulnar Art at Wrist     25        0.30                triphasic           +-----------------------+----------+--------------------+---------+--------   Left Pre-Dialysis Findings:  +-----------------------+----------+--------------------+---------+--------  ---+  Location              PSV (cm/s)Intralum. Diam. (cm)Waveform  Comments     +-----------------------+----------+--------------------+---------+--------  ---+  Brachial Antecub. fossa96        0.63                triphasic              +-----------------------+----------+--------------------+---------+--------  ---+  Radial Art at Wrist    133       0.48                triphasicmid  forearm  +-----------------------+----------+--------------------+---------+--------  ---+  Ulnar Art at Wrist     25        0.46                triphasicmid  forearm  +-----------------------+----------+--------------------+---------+--------  ---+   Right Cephalic   Diameter (cm)Depth (cm)        Findings           +-----------------+-------------+----------+-------------------------+  Shoulder            0.34                                          +-----------------+-------------+----------+-------------------------+  Prox upper arm       0.50                                          +-----------------+-------------+----------+-------------------------+  Mid upper arm        0.45               change in diameter .28 cm  +-----------------+-------------+----------+-------------------------+  Dist upper arm       0.35                 branching and .28 cm      +-----------------+-------------+----------+-------------------------+  Antecubital fossa    0.67                                          +-----------------+-------------+----------+-------------------------+  Prox forearm         0.36                 branching and .23 cm     +-----------------+-------------+----------+-------------------------+  Mid forearm          0.37                 branching and .18 cm     +-----------------+-------------+----------+-------------------------+  Wrist               0.24                                          +-----------------+-------------+----------+-------------------------+   +-----------------+-------------+----------+--------------------+  Right Basilic    Diameter (cm)Depth (cm)      Findings        +-----------------+-------------+----------+--------------------+  Shoulder            0.35                                     +-----------------+-------------+----------+--------------------+  Prox upper arm       0.27                                     +-----------------+-------------+----------+--------------------+  Mid upper arm        0.23                                     +-----------------+-------------+----------+--------------------+  Dist upper arm       0.23               branching and .17 cm  +-----------------+-------------+----------+--------------------+  Antecubital fossa    0.32               branching and .29 cm  +-----------------+-------------+----------+--------------------+  Prox forearm         0.28                                     +-----------------+-------------+----------+--------------------+  Mid forearm          0.42                                     +-----------------+-------------+----------+--------------------+  Wrist               0.28  branching and .24 cm  +-----------------+-------------+----------+--------------------+    Rouleau flow is noted throughout the cephalic and basilc veins.  +-----------------+-------------+----------+--------+  Left Cephalic    Diameter (cm)Depth (cm)Findings  +-----------------+-------------+----------+--------+  Shoulder            0.40                         +-----------------+-------------+----------+--------+  Prox upper arm       0.40                         +-----------------+-------------+----------+--------+  Mid upper arm        0.44                         +-----------------+-------------+----------+--------+  Dist upper arm       0.38                         +-----------------+-------------+----------+--------+  Antecubital fossa    0.79                         +-----------------+-------------+----------+--------+   +-----------------+-------------+----------+-----------------------+  Left Basilic     Diameter (cm)Depth (cm)       Findings          +-----------------+-------------+----------+-----------------------+  Shoulder            0.62                                        +-----------------+-------------+----------+-----------------------+  Prox upper arm       0.59               branching and .14 cm.55  +-----------------+-------------+----------+-----------------------+  Mid upper arm        0.55                                        +-----------------+-------------+----------+-----------------------+  Dist upper arm       0.39                branching and .32 cm    +-----------------+-------------+----------+-----------------------+  Antecubital fossa    0.43                .23 cm and branching    +-----------------+-------------+----------+-----------------------+  Prox forearm         0.37                branching and .27 cm    +-----------------+-------------+----------+-----------------------+  Mid forearm          0.37                .22 cm and branching     +-----------------+-------------+----------+-----------------------+   Rouleau flow is noted throughout the cephalic and basilc veins.    Summary: Right: Patent cephalic and baslic veins with measurements noted         above.           Rouleau flow is noted throughout the cephalic and basilc         veins.  Left: Patent cephalic and baslic veins with measurements noted        above.  Rouleau flow is noted throughout the cephalic and basilc        veins.      Assessment/Plan:    38 year old male with end-stage renal disease currently on dialysis Tuesdays, Thursdays and Saturdays via right IJ catheter.  He has a mostly thrombosed left forearm AV fistula.  He is hopeful to keep access in his left arm and we will plan for left upper arm AV fistula versus graft.  He did have intervention of the cephalic vein in the upper arm likely will require basilic vein fistula and it is possible this could be a single-stage procedure given history of forearm fistula.  Patient has also requested that we remove the pseudoaneurysms in the forearm this is certainly reasonable as long as the creation of the fistula is straightforward we will plan to ligate the forearm fistula and removed 2 areas of pseudoaneurysm.  We discussed the risk benefits alternatives he demonstrates good understanding.     Penne Lonni Colorado MD [facility]

## 2024-05-23 NOTE — H&P (View-Only) (Signed)
 Patient ID: [name], male   DOB: 1985/11/17, 38 y.o.   MRN: [id number]  Reason for Consult: Follow-up   Referred by Jaycee Greig PARAS, NP  Subjective:     HPI:  [name] is a 38 y.o. male history of end-stage renal disease has been on dialysis 3 years ago left forearm AV fistula and now on dialysis via catheter on Tuesdays, Thursdays and Saturdays.  He does not take any blood thinners.  He has not had any upper extremity procedures other than balloon angioplasty of the cephalic vein in the past.  He is currently on a transplant list.  He is a single father with a 15-year-old son.  He is legally blind.  Past Medical History:  Diagnosis Date   ADHD (attention deficit hyperactivity disorder)    Diabetes mellitus    ESRD on hemodialysis (HCC)    [facility]   High cholesterol    Hypertension    Hypertrophic cardiomyopathy (HCC) 02/26/2021   Seizure (HCC) 02/08/2021   Family History  Problem Relation Age of Onset   Cancer Mother    Stroke Father    Diabetes Father    Hypertension Father    Past Surgical History:  Procedure Laterality Date   A/V SHUNT INTERVENTION N/A 12/21/2023   Procedure: A/V SHUNT INTERVENTION;  Surgeon: Pearline Norman RAMAN, MD;  Location: [facility];  Service: Cardiovascular;  Laterality: N/A;   AMPUTATION TOE Right 12/29/2021   Procedure: AMPUTATION TOE, fifth;  Surgeon: Joya Stabs, DPM;  Location: [facility];  Service: Podiatry;  Laterality: Right;  surgical team will do local block   AV FISTULA PLACEMENT Left 08/11/2020   Procedure: LEFT UPPER EXTREMITY ARTERIOVENOUS (AV) FISTULA CREATION;  Surgeon: Magda Debby SAILOR, MD;  Location: [facility] OR;  Service: Vascular;  Laterality: Left;   BIOPSY  08/04/2023   Procedure: BIOPSY;  Surgeon: Cinderella Deatrice FALCON, MD;  Location: [facility];  Service: Endoscopy;;   DIALYSIS/PERMA CATHETER INSERTION Right 04/25/2024   Procedure: DIALYSIS/PERMA CATHETER INSERTION;  Surgeon: Pearline Norman RAMAN, MD;  Location:  [facility];  Service: Cardiovascular;  Laterality: Right;   ESOPHAGOGASTRODUODENOSCOPY (EGD) WITH PROPOFOL  N/A 08/04/2023   Procedure: ESOPHAGOGASTRODUODENOSCOPY (EGD) WITH PROPOFOL ;  Surgeon: Cinderella Deatrice FALCON, MD;  Location: [facility];  Service: Endoscopy;  Laterality: N/A;   FISTULA SUPERFICIALIZATION Left 12/02/2021   Procedure: PLICATION OF LEFT RADIUS CEPHALIC FISTULA;  Surgeon: Magda Debby SAILOR, MD;  Location: [facility];  Service: Vascular;  Laterality: Left;  PERIPHERAL NERVE BLOCK   FRACTURE SURGERY     I & D EXTREMITY Left 07/16/2014   Procedure: IRRIGATION AND DEBRIDEMENT EXTREMITY/LEFT INDEX FINGER;  Surgeon: Franky Curia, MD;  Location: [facility];  Service: Orthopedics;  Laterality: Left;   IR FLUORO GUIDE CV LINE RIGHT  10/04/2022   IR FLUORO GUIDE CV LINE RIGHT  10/05/2022   IR PERC TUN PERIT CATH WO PORT S&I /IMAG  08/07/2020   IR REMOVAL TUN CV CATH W/O FL  10/27/2022   IR THROMBECTOMY AV FISTULA W/THROMBOLYSIS/PTA INC/SHUNT/IMG LEFT Left 10/05/2022   IR US  GUIDE VASC ACCESS RIGHT  08/07/2020   IR US  GUIDE VASC ACCESS RIGHT  10/04/2022   IR US  GUIDE VASC ACCESS RIGHT  10/05/2022   TEE WITHOUT CARDIOVERSION N/A 10/28/2022   Procedure: TRANSESOPHAGEAL ECHOCARDIOGRAM;  Surgeon: Sheena Pugh, DO;  Location: [facility];  Service: Cardiovascular;  Laterality: N/A;   VENOUS ANGIOPLASTY Left 12/21/2023   Procedure: VENOUS ANGIOPLASTY;  Surgeon: Pearline Norman  S, MD;  Location: [facility];  Service: Cardiovascular;  Laterality: Left;    Short Social History:  Social History   Tobacco Use   Smoking status: Never    Passive exposure: Never   Smokeless tobacco: Never  Substance Use Topics   Alcohol use: Not Currently    Comment: social drinker    Allergies  Allergen Reactions   Apresoline  [Hydralazine ] Nausea And Vomiting and Other (See Comments)    Patient was taking Hydralazine  AND Amlodipine  at the same time, so the reactions came from one of the 2: Lethargy and an all-over feeling  of NOT feeling well   Norvasc  [Amlodipine ] Nausea And Vomiting and Other (See Comments)    Patient was taking Amlodipine  AND Hydralazine  at the same time, so the reactions came from one of the 2: Lethargy and an all-over feeling of NOT feeling well     Current Outpatient Medications  Medication Sig Dispense Refill   atorvastatin  (LIPITOR) 40 MG tablet Take 1 tablet (40 mg total) by mouth daily. (Patient taking differently: Take 40 mg by mouth daily after supper.) 30 tablet 2   benzonatate  (TESSALON  PERLES) 100 MG capsule Take 1 capsule (100 mg total) by mouth 3 (three) times daily as needed. 30 capsule 2   cloNIDine  (CATAPRES ) 0.1 MG tablet Take 1 tablet (0.1 mg total) by mouth 2 (two) times daily. 60 tablet 2   diphenhydramine -acetaminophen  (TYLENOL  PM) 25-500 MG TABS tablet Take 2 tablets by mouth 2 (two) times daily as needed (pain, sleep).     insulin  glargine (LANTUS  SOLOSTAR) 100 UNIT/ML Solostar Pen INJECT 12 UNITS INTO THE SKIN AT BEDTIME 15 mL 1   insulin  glargine-yfgn (SEMGLEE ) 100 UNIT/ML injection Inject 0.12 mLs (12 Units total) into the skin at bedtime. (Patient taking differently: Inject 7 Units into the skin at bedtime.) 10 mL 2   insulin  glargine-yfgn (SEMGLEE ) 100 UNIT/ML Pen INJECT 0.12 MLS (12 UNITS TOTAL) INTO THE SKIN AT BEDTIME. 10 mL 2   insulin  lispro (HUMALOG ) 100 UNIT/ML KwikPen INJECT PER SLIDING SCALE 3 TIMES DAILY WITH MEALS IF GLUCOSE IS 151-200=1 UNIT,201-250=2U, 251-300=3U,301-350=5U, 351-400=7U. CALL MD IF >400 15 mL 10   labetalol  (NORMODYNE ) 200 MG tablet Take 1 tablet (200 mg total) by mouth 2 (two) times daily. 180 tablet 0   metoCLOPramide  (REGLAN ) 5 MG tablet Take 5 mg by mouth 4 (four) times daily -  before meals and at bedtime.     minoxidil (LONITEN) 2.5 MG tablet Take 2.5 mg by mouth 2 (two) times daily.     Multiple Vitamins-Minerals (MULTIVITAMIN WITH MINERALS) tablet Take 1 tablet by mouth daily.     pantoprazole  (PROTONIX ) 40 MG tablet TAKE 1 TABLET  (40 MG TOTAL) BY MOUTH 2 (TWO) TIMES DAILY. 60 tablet 11   sodium zirconium cyclosilicate  (LOKELMA ) 10 g PACK packet Take 10 g by mouth See admin instructions. Take 1 packet (10g) once daily on non-dialysis days (Sunday, Monday, Wednesday, Friday)     sucralfate  (CARAFATE ) 1 GM/10ML suspension TAKE 10 MLS (1 G TOTAL) BY MOUTH 4 (FOUR) TIMES DAILY. 420 mL 10   SURE COMFORT PEN NEEDLES 31G X 8 MM MISC USE AS DIRECTED WITH INSULIN  PENS FOR INJECTION 100 each 10   No current facility-administered medications for this visit.    Review of Systems  Constitutional:  Constitutional negative. HENT: HENT negative.  Eyes: Positive for loss of vision.   Respiratory: Respiratory negative.  Cardiovascular: Cardiovascular negative.  GI: Gastrointestinal negative.  Musculoskeletal: Musculoskeletal negative.  Skin:  Skin negative.  Hematologic: Hematologic/lymphatic negative.  Psychiatric: Psychiatric negative.        Objective:  Objective   Vitals:   05/23/24 1146  BP: (!) 131/93  Pulse: 89  Temp: 97.8 F (36.6 C)  SpO2: 97%   There is no height or weight on file to calculate BMI.  Physical Exam HENT:     Head: Normocephalic.     Nose: Nose normal.  Eyes:     Pupils: Pupils are equal, round, and reactive to light.  Cardiovascular:     Pulses:          Radial pulses are 2+ on the right side and 2+ on the left side.  Pulmonary:     Effort: Pulmonary effort is normal.  Abdominal:     General: Abdomen is flat.  Musculoskeletal:     Comments: Large pseudoaneurysms left forearm  Neurological:     General: No focal deficit present.     Mental Status: He is alert.     Data: Right Pre-Dialysis Findings:  +-----------------------+----------+--------------------+---------+--------  +  Location              PSV (cm/s)Intralum. Diam. (cm)Waveform  Comments  +-----------------------+----------+--------------------+---------+--------  +  Brachial Antecub. fossa54        0.52                 triphasic           +-----------------------+----------+--------------------+---------+--------  +  Radial Art at Wrist    39        0.31                triphasic           +-----------------------+----------+--------------------+---------+--------  +  Ulnar Art at Wrist     25        0.30                triphasic           +-----------------------+----------+--------------------+---------+--------   Left Pre-Dialysis Findings:  +-----------------------+----------+--------------------+---------+--------  ---+  Location              PSV (cm/s)Intralum. Diam. (cm)Waveform  Comments     +-----------------------+----------+--------------------+---------+--------  ---+  Brachial Antecub. fossa96        0.63                triphasic              +-----------------------+----------+--------------------+---------+--------  ---+  Radial Art at Wrist    133       0.48                triphasicmid  forearm  +-----------------------+----------+--------------------+---------+--------  ---+  Ulnar Art at Wrist     25        0.46                triphasicmid  forearm  +-----------------------+----------+--------------------+---------+--------  ---+   Right Cephalic   Diameter (cm)Depth (cm)        Findings           +-----------------+-------------+----------+-------------------------+  Shoulder            0.34                                          +-----------------+-------------+----------+-------------------------+  Prox upper arm       0.50                                          +-----------------+-------------+----------+-------------------------+  Mid upper arm        0.45               change in diameter .28 cm  +-----------------+-------------+----------+-------------------------+  Dist upper arm       0.35                 branching and .28 cm      +-----------------+-------------+----------+-------------------------+  Antecubital fossa    0.67                                          +-----------------+-------------+----------+-------------------------+  Prox forearm         0.36                 branching and .23 cm     +-----------------+-------------+----------+-------------------------+  Mid forearm          0.37                 branching and .18 cm     +-----------------+-------------+----------+-------------------------+  Wrist               0.24                                          +-----------------+-------------+----------+-------------------------+   +-----------------+-------------+----------+--------------------+  Right Basilic    Diameter (cm)Depth (cm)      Findings        +-----------------+-------------+----------+--------------------+  Shoulder            0.35                                     +-----------------+-------------+----------+--------------------+  Prox upper arm       0.27                                     +-----------------+-------------+----------+--------------------+  Mid upper arm        0.23                                     +-----------------+-------------+----------+--------------------+  Dist upper arm       0.23               branching and .17 cm  +-----------------+-------------+----------+--------------------+  Antecubital fossa    0.32               branching and .29 cm  +-----------------+-------------+----------+--------------------+  Prox forearm         0.28                                     +-----------------+-------------+----------+--------------------+  Mid forearm          0.42                                     +-----------------+-------------+----------+--------------------+  Wrist               0.28  branching and .24 cm  +-----------------+-------------+----------+--------------------+    Rouleau flow is noted throughout the cephalic and basilc veins.  +-----------------+-------------+----------+--------+  Left Cephalic    Diameter (cm)Depth (cm)Findings  +-----------------+-------------+----------+--------+  Shoulder            0.40                         +-----------------+-------------+----------+--------+  Prox upper arm       0.40                         +-----------------+-------------+----------+--------+  Mid upper arm        0.44                         +-----------------+-------------+----------+--------+  Dist upper arm       0.38                         +-----------------+-------------+----------+--------+  Antecubital fossa    0.79                         +-----------------+-------------+----------+--------+   +-----------------+-------------+----------+-----------------------+  Left Basilic     Diameter (cm)Depth (cm)       Findings          +-----------------+-------------+----------+-----------------------+  Shoulder            0.62                                        +-----------------+-------------+----------+-----------------------+  Prox upper arm       0.59               branching and .14 cm.55  +-----------------+-------------+----------+-----------------------+  Mid upper arm        0.55                                        +-----------------+-------------+----------+-----------------------+  Dist upper arm       0.39                branching and .32 cm    +-----------------+-------------+----------+-----------------------+  Antecubital fossa    0.43                .23 cm and branching    +-----------------+-------------+----------+-----------------------+  Prox forearm         0.37                branching and .27 cm    +-----------------+-------------+----------+-----------------------+  Mid forearm          0.37                .22 cm and branching     +-----------------+-------------+----------+-----------------------+   Rouleau flow is noted throughout the cephalic and basilc veins.    Summary: Right: Patent cephalic and baslic veins with measurements noted         above.           Rouleau flow is noted throughout the cephalic and basilc         veins.  Left: Patent cephalic and baslic veins with measurements noted        above.  Rouleau flow is noted throughout the cephalic and basilc        veins.      Assessment/Plan:    38 year old male with end-stage renal disease currently on dialysis Tuesdays, Thursdays and Saturdays via right IJ catheter.  He has a mostly thrombosed left forearm AV fistula.  He is hopeful to keep access in his left arm and we will plan for left upper arm AV fistula versus graft.  He did have intervention of the cephalic vein in the upper arm likely will require basilic vein fistula and it is possible this could be a single-stage procedure given history of forearm fistula.  Patient has also requested that we remove the pseudoaneurysms in the forearm this is certainly reasonable as long as the creation of the fistula is straightforward we will plan to ligate the forearm fistula and removed 2 areas of pseudoaneurysm.  We discussed the risk benefits alternatives he demonstrates good understanding.     Penne Lonni Colorado MD [facility]

## 2024-05-30 ENCOUNTER — Telehealth: Payer: Self-pay | Admitting: Emergency Medicine

## 2024-05-30 NOTE — Telephone Encounter (Signed)
 I called patient and made him aware that he needs an appointment to see pcp to fill out paperwork.  I transferred patient to front desk to schedule appointment.

## 2024-05-30 NOTE — Telephone Encounter (Signed)
 Icalled patient and made him aware that he needs to schedule an appointment with pcp to have paperwork filled out

## 2024-06-18 ENCOUNTER — Encounter: Payer: Self-pay | Admitting: Family

## 2024-06-18 ENCOUNTER — Ambulatory Visit: Admitting: Family

## 2024-06-18 ENCOUNTER — Ambulatory Visit: Payer: Self-pay | Admitting: Family

## 2024-06-18 VITALS — BP 126/78 | HR 70 | Temp 98.6°F | Resp 16 | Ht 72.0 in | Wt 208.4 lb

## 2024-06-18 DIAGNOSIS — Z794 Long term (current) use of insulin: Secondary | ICD-10-CM | POA: Diagnosis not present

## 2024-06-18 DIAGNOSIS — I422 Other hypertrophic cardiomyopathy: Secondary | ICD-10-CM

## 2024-06-18 DIAGNOSIS — I10 Essential (primary) hypertension: Secondary | ICD-10-CM | POA: Diagnosis not present

## 2024-06-18 DIAGNOSIS — Z0289 Encounter for other administrative examinations: Secondary | ICD-10-CM

## 2024-06-18 DIAGNOSIS — E119 Type 2 diabetes mellitus without complications: Secondary | ICD-10-CM

## 2024-06-18 DIAGNOSIS — N186 End stage renal disease: Secondary | ICD-10-CM | POA: Diagnosis not present

## 2024-06-18 DIAGNOSIS — F419 Anxiety disorder, unspecified: Secondary | ICD-10-CM | POA: Diagnosis not present

## 2024-06-18 DIAGNOSIS — Z7902 Long term (current) use of antithrombotics/antiplatelets: Secondary | ICD-10-CM | POA: Diagnosis not present

## 2024-06-18 DIAGNOSIS — F32A Depression, unspecified: Secondary | ICD-10-CM | POA: Diagnosis not present

## 2024-06-18 DIAGNOSIS — E1122 Type 2 diabetes mellitus with diabetic chronic kidney disease: Secondary | ICD-10-CM

## 2024-06-18 LAB — POCT GLYCOSYLATED HEMOGLOBIN (HGB A1C): Hemoglobin A1C: 6.5 % — AB (ref 4.0–5.6)

## 2024-06-18 MED ORDER — INSULIN LISPRO (1 UNIT DIAL) 100 UNIT/ML (KWIKPEN): PEN_INJECTOR | SUBCUTANEOUS | 11 refills | Status: AC

## 2024-06-18 MED ORDER — INSULIN GLARGINE-YFGN 100 UNIT/ML ~~LOC~~ SOPN: PEN_INJECTOR | SUBCUTANEOUS | 0 refills | Status: DC

## 2024-06-18 MED ORDER — CLONIDINE HCL 0.1 MG PO TABS: 0.1000 mg | ORAL_TABLET | Freq: Two times a day (BID) | ORAL | 2 refills | Status: DC

## 2024-06-18 MED ORDER — LABETALOL HCL 200 MG PO TABS: 200.0000 mg | ORAL_TABLET | Freq: Two times a day (BID) | ORAL | 0 refills | Status: AC

## 2024-06-18 NOTE — Progress Notes (Signed)
 Patient ID: [name], male    DOB: [birth year]  MRN: [id number]  CC: Chronic Conditions Follow-Up  Subjective: [name] is a 38 y.o. male who presents for chronic conditions follow-up.    His concerns today include:  - Doing well on Clonidine  and Labetalol . Since previous office visit he did not establish with Cardiology. He does not complain of red flag symptoms such as but not limited to chest pain, shortness of breath, worst headache of life, nausea/vomiting.  - Doing well on Insulin  Glargine-YFGN and Insulin  Lispro, no issues/concerns. Denies red flag symptoms associated with diabetes.  - Due for diabetic eye exam.  - Due for diabetic foot exam.  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 States he feels home health aide would help him feel better from a mental health perspective. He has personal care services form today in office for completion. Established with therapist. He denies thoughts of self-harm, suicidal ideations, homicidal ideations.  - States his medications come prepackaged and he thinks some of his morning medications may be causing lightheadedness but he isn't sure which ones. States because of this sometimes he doesn't take all of his medications as prescribed.   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) 12/29/2021   Status post surgery 12/29/2021   Hypoglycemia 10/30/2021   Acute hypoxic respiratory failure (HCC)  03/12/2021   ESRD (end stage renal disease) on dialysis (HCC) 03/12/2021   Hypertrophic cardiomyopathy (HCC) 02/26/2021   Syncope 02/08/2021   DKA (diabetic ketoacidosis) (HCC) 02/08/2021   Hyperglycemia    Diabetic foot ulcer (HCC) 11/24/2020   Hypervolemia associated with renal insufficiency 08/07/2020   Uremia 08/07/2020   Metabolic acidosis 08/07/2020   Nausea and vomiting 05/07/2020   Dehydration    Hypertensive urgency 02/25/2020   Anemia 12/02/2019   Essential hypertension 12/02/2019   SOB (shortness of breath) 12/02/2019   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 (Patient taking differently: Take 40 mg by mouth daily after supper.) 30 tablet 2   benzonatate  (TESSALON  PERLES) 100 MG capsule Take 1 capsule (100 mg total) by mouth 3 (three) times daily as needed. 30 capsule 2   diphenhydramine -acetaminophen  (TYLENOL  PM) 25-500 MG TABS tablet Take 2 tablets by mouth 2 (two) times daily as needed (pain, sleep).     insulin  glargine (LANTUS  SOLOSTAR) 100 UNIT/ML Solostar Pen INJECT 12 UNITS INTO THE SKIN AT BEDTIME 15 mL 1   metoCLOPramide  (REGLAN ) 5 MG tablet Take 5 mg by mouth 4 (four) times daily -  before meals and at bedtime.     minoxidil (LONITEN) 2.5 MG tablet Take 2.5 mg by mouth 2 (two) times daily.     Multiple Vitamins-Minerals (MULTIVITAMIN  WITH MINERALS) tablet Take 1 tablet by mouth daily.     pantoprazole  (PROTONIX ) 40 MG tablet TAKE 1 TABLET (40 MG TOTAL) BY MOUTH 2 (TWO) TIMES DAILY. 60 tablet 11   sodium zirconium cyclosilicate  (LOKELMA ) 10 g PACK packet Take 10 g by mouth See admin instructions. Take 1 packet (10g) once daily on non-dialysis days (Sunday, Monday,  Wednesday, Friday)     sucralfate  (CARAFATE ) 1 GM/10ML suspension TAKE 10 MLS (1 G TOTAL) BY MOUTH 4 (FOUR) TIMES DAILY. 420 mL 10   SURE COMFORT PEN NEEDLES 31G X 8 MM MISC USE AS DIRECTED WITH INSULIN  PENS FOR INJECTION 100 each 10   insulin  glargine-yfgn (SEMGLEE ) 100 UNIT/ML injection Inject 0.12 mLs (12 Units total) into the skin at bedtime. (Patient taking differently: Inject 7 Units into the skin at bedtime.) 10 mL 2   No current facility-administered medications on file prior to visit.    Allergies  Allergen Reactions   Apresoline  [Hydralazine ] Nausea And Vomiting and Other (See Comments)    Patient was taking Hydralazine  AND Amlodipine  at the same time, so the reactions came from one of the 2: Lethargy and an all-over feeling of NOT feeling well   Norvasc  [Amlodipine ] Nausea And Vomiting and Other (See Comments)    Patient was taking Amlodipine  AND Hydralazine  at the same time, so the reactions came from one of the 2: Lethargy and an all-over feeling of NOT feeling well     Social History   Socioeconomic History   Marital status: Single    Spouse name: Not on file   Number of children: Not on file   Years of education: Not on file   Highest education level: Not on file  Occupational History   Occupation: disabled  Tobacco Use   Smoking status: Never    Passive exposure: Never   Smokeless tobacco: Never  Vaping Use   Vaping status: Never Used  Substance and Sexual Activity   Alcohol use: Not Currently    Comment: social drinker   Drug use: No   Sexual activity: Yes  Other Topics Concern   Not on file  Social History Narrative   Not on file   Social Drivers of Health   Financial Resource Strain: Low Risk  (11/07/2023)   Overall Financial Resource Strain (CARDIA)    Difficulty of Paying Living Expenses: Not hard at all  Food Insecurity: No Food Insecurity (11/10/2023)   Hunger Vital Sign    Worried About Running Out of Food in the Last Year: Never true    Ran Out  of Food in the Last Year: Never true  Transportation Needs: No Transportation Needs (11/10/2023)   PRAPARE - Administrator, Civil Service (Medical): No    Lack of Transportation (Non-Medical): No  Physical Activity: Inactive (11/07/2023)   Exercise Vital Sign    Days of Exercise per Week: 0 days    Minutes of Exercise per Session: 0 min  Stress: No Stress Concern Present (11/07/2023)   [employer] of Occupational Health - Occupational Stress Questionnaire    Feeling of Stress : Only a little  Social Connections: Socially Isolated (11/10/2023)   Social Connection and Isolation Panel    Frequency of Communication with Friends and Family: More than three times a week    Frequency of Social Gatherings with Friends and Family: More than three times a week    Attends Religious Services: Never    Database Administrator or Organizations: No  Attends Banker Meetings: Never    Marital Status: Never married  Intimate Partner Violence: Not At Risk (11/10/2023)   Humiliation, Afraid, Rape, and Kick questionnaire    Fear of Current or Ex-Partner: No    Emotionally Abused: No    Physically Abused: No    Sexually Abused: No    Family History  Problem Relation Age of Onset   Cancer Mother    Stroke Father    Diabetes Father    Hypertension Father     Past Surgical History:  Procedure Laterality Date   A/V SHUNT INTERVENTION N/A 12/21/2023   Procedure: A/V SHUNT INTERVENTION;  Surgeon: Pearline Norman RAMAN, MD;  Location: [facility];  Service: Cardiovascular;  Laterality: N/A;   AMPUTATION TOE Right 12/29/2021   Procedure: AMPUTATION TOE, fifth;  Surgeon: Joya Stabs, DPM;  Location: [facility] OR;  Service: Podiatry;  Laterality: Right;  surgical team will do local block   AV FISTULA PLACEMENT Left 08/11/2020   Procedure: LEFT UPPER EXTREMITY ARTERIOVENOUS (AV) FISTULA CREATION;  Surgeon: Magda Debby SAILOR, MD;  Location: [facility];  Service: Vascular;  Laterality: Left;   BIOPSY   08/04/2023   Procedure: BIOPSY;  Surgeon: Cinderella Deatrice FALCON, MD;  Location: [facility];  Service: Endoscopy;;   DIALYSIS/PERMA CATHETER INSERTION Right 04/25/2024   Procedure: DIALYSIS/PERMA CATHETER INSERTION;  Surgeon: Pearline Norman RAMAN, MD;  Location: [facility];  Service: Cardiovascular;  Laterality: Right;   ESOPHAGOGASTRODUODENOSCOPY (EGD) WITH PROPOFOL  N/A 08/04/2023   Procedure: ESOPHAGOGASTRODUODENOSCOPY (EGD) WITH PROPOFOL ;  Surgeon: Cinderella Deatrice FALCON, MD;  Location: [facility];  Service: Endoscopy;  Laterality: N/A;   FISTULA SUPERFICIALIZATION Left 12/02/2021   Procedure: PLICATION OF LEFT RADIUS CEPHALIC FISTULA;  Surgeon: Magda Debby SAILOR, MD;  Location: [facility] OR;  Service: Vascular;  Laterality: Left;  PERIPHERAL NERVE BLOCK   FRACTURE SURGERY     I & D EXTREMITY Left 07/16/2014   Procedure: IRRIGATION AND DEBRIDEMENT EXTREMITY/LEFT INDEX FINGER;  Surgeon: Franky Curia, MD;  Location: [facility];  Service: Orthopedics;  Laterality: Left;   IR FLUORO GUIDE CV LINE RIGHT  10/04/2022   IR FLUORO GUIDE CV LINE RIGHT  10/05/2022   IR PERC TUN PERIT CATH WO PORT S&I /IMAG  08/07/2020   IR REMOVAL TUN CV CATH W/O FL  10/27/2022   IR THROMBECTOMY AV FISTULA W/THROMBOLYSIS/PTA INC/SHUNT/IMG LEFT Left 10/05/2022   IR US  GUIDE VASC ACCESS RIGHT  08/07/2020   IR US  GUIDE VASC ACCESS RIGHT  10/04/2022   IR US  GUIDE VASC ACCESS RIGHT  10/05/2022   TEE WITHOUT CARDIOVERSION N/A 10/28/2022   Procedure: TRANSESOPHAGEAL ECHOCARDIOGRAM;  Surgeon: Sheena Pugh, DO;  Location: [facility];  Service: Cardiovascular;  Laterality: N/A;   VENOUS ANGIOPLASTY Left 12/21/2023   Procedure: VENOUS ANGIOPLASTY;  Surgeon: Pearline Norman RAMAN, MD;  Location: [facility];  Service: Cardiovascular;  Laterality: Left;    ROS: Review of Systems Negative except as stated above  PHYSICAL EXAM: BP 126/78   Pulse 70   Temp 98.6 F (37 C) (Oral)   Resp 16   Ht 6' (1.829 m)   Wt 208 lb 6.4 oz (94.5 kg)   SpO2 97%   BMI  28.26 kg/m   Physical Exam HENT:     Head: Normocephalic and atraumatic.     Nose: Nose normal.     Mouth/Throat:     Mouth: Mucous membranes are moist.     Pharynx: Oropharynx is clear.  Eyes:  Extraocular Movements: Extraocular movements intact.     Conjunctiva/sclera: Conjunctivae normal.     Pupils: Pupils are equal, round, and reactive to light.  Cardiovascular:     Rate and Rhythm: Normal rate and regular rhythm.     Pulses: Normal pulses.     Heart sounds: Normal heart sounds.  Pulmonary:     Effort: Pulmonary effort is normal.     Breath sounds: Normal breath sounds.  Musculoskeletal:        General: Normal range of motion.     Cervical back: Normal range of motion and neck supple.  Neurological:     General: No focal deficit present.     Mental Status: He is alert and oriented to person, place, and time.  Psychiatric:        Mood and Affect: Mood normal.        Behavior: Behavior normal.     ASSESSMENT AND PLAN: 1. Primary hypertension (Primary) 2. Hypertrophic cardiomyopathy (HCC) - Continue Labetalol  and Clonidine  as prescribed.  - Counseled on blood pressure goal of less than 130/80, low-sodium, DASH diet, medication compliance, and 150 minutes of moderate intensity exercise per week as tolerated. Counseled on medication adherence and adverse effects. - Referral to Cardiology for evaluation/management.  - Follow-up with primary provider as scheduled.  - labetalol  (NORMODYNE ) 200 MG tablet; Take 1 tablet (200 mg total) by mouth 2 (two) times daily.  Dispense: 180 tablet; Refill: 0 - cloNIDine  (CATAPRES ) 0.1 MG tablet; Take 1 tablet (0.1 mg total) by mouth 2 (two) times daily.  Dispense: 60 tablet; Refill: 2 - Ambulatory referral to Cardiology  3. 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  - Continue Insulin  Glargine-YFGN and Insulin  Lispro as prescribed.  - Discussed the importance of healthy eating  habits, low-carbohydrate diet, low-sugar diet, regular aerobic exercise (at least 150 minutes a week as tolerated) and medication compliance to achieve or maintain control of diabetes. Counseled on medication adherence/adverse effects.  - Follow-up with primary provider as scheduled.  - insulin  glargine-yfgn (SEMGLEE ) 100 UNIT/ML Pen; INJECT 0.12 MLS (12 UNITS TOTAL) INTO THE SKIN AT BEDTIME.  Dispense: 15 mL; Refill: 0 - insulin  lispro (HUMALOG ) 100 UNIT/ML KwikPen; INJECT PER SLIDING SCALE 3 TIMES DAILY WITH MEALS IF GLUCOSE IS 151-200=1 UNIT,201-250=2U, 251-300=3U,301-350=5U, 351-400=7U. CALL MD IF >400  Dispense: 15 mL; Refill: 11 - POCT glycosylated hemoglobin (Hb A1C); Future  4. Diabetic eye exam [facility]) - Referral to Ophthalmology for evaluation/management.  - Ambulatory referral to Ophthalmology  5. Encounter for diabetic foot exam [facility]) - Referral to Podiatry for evaluation/management.  - Ambulatory referral to Podiatry  6. Anxiety and depression - Patient denies thoughts of self-harm, suicidal ideations, homicidal ideations. - Patient declined pharmacological therapy.  - Patient declined referral to Psychiatry.  - Keep all scheduled appointments with established therapist.  - Follow-up with primary provider as scheduled.   7. Encounter for completion of form with patient - Request For Independent Assessment For Personal Care Services (PCS) Attestation Of Medical Need form sent by Purvis Pepper, CMA to RN case manager Slater Diesel for completion. I will sign once complete.   Patient was given the opportunity to ask questions.  Patient verbalized understanding of the plan and was able to repeat key elements of the plan. Patient was given clear instructions to go to Emergency Department or return to medical center if symptoms don't improve, worsen, or new problems develop.The patient verbalized understanding.   Orders Placed  This Encounter  Procedures   Ambulatory referral to  Podiatry   Ambulatory referral to Ophthalmology   Ambulatory referral to Cardiology   POCT glycosylated hemoglobin (Hb A1C)     Requested Prescriptions   Signed Prescriptions Disp Refills   labetalol  (NORMODYNE ) 200 MG tablet 180 tablet 0    Sig: Take 1 tablet (200 mg total) by mouth 2 (two) times daily.   cloNIDine  (CATAPRES ) 0.1 MG tablet 60 tablet 2    Sig: Take 1 tablet (0.1 mg total) by mouth 2 (two) times daily.   insulin  glargine-yfgn (SEMGLEE ) 100 UNIT/ML Pen 15 mL 0    Sig: INJECT 0.12 MLS (12 UNITS TOTAL) INTO THE SKIN AT BEDTIME.   insulin  lispro (HUMALOG ) 100 UNIT/ML KwikPen 15 mL 11    Sig: INJECT PER SLIDING SCALE 3 TIMES DAILY WITH MEALS IF GLUCOSE IS 151-200=1 UNIT,201-250=2U, 251-300=3U,301-350=5U, 351-400=7U. CALL MD IF >400    Return in 3 months (on 09/16/2024) for Follow-Up or next available chronic conditions.  Greig JINNY Chute, NP

## 2024-06-18 NOTE — Progress Notes (Signed)
 Paperwork, medication making patient feel light headed, patient scored a 18 on GAD-7

## 2024-06-19 ENCOUNTER — Telehealth: Payer: Self-pay

## 2024-06-19 NOTE — Telephone Encounter (Signed)
 Signed PCS request efaxed to [employer]: [phone].

## 2024-06-21 ENCOUNTER — Other Ambulatory Visit: Payer: Self-pay

## 2024-06-21 ENCOUNTER — Encounter (HOSPITAL_COMMUNITY): Payer: Self-pay | Admitting: Vascular Surgery

## 2024-06-21 NOTE — Anesthesia Preprocedure Evaluation (Signed)
 Anesthesia Evaluation  Patient identified by MRN, date of birth, ID band Patient awake    Reviewed: Allergy & Precautions, NPO status , Patient's Chart, lab work & pertinent test results  History of Anesthesia Complications Negative for: history of anesthetic complications  Airway Mallampati: II  TM Distance: >3 FB Neck ROM: Full    Dental  (+) Teeth Intact, Dental Advisory Given   Pulmonary neg pulmonary ROS   breath sounds clear to auscultation       Cardiovascular hypertension (on Labetalol , Clonidine  Patch), Pt. on medications and Pt. on home beta blockers +CHF   Rhythm:Regular Rate:Normal  TTE (2024): 1. Left ventricular ejection fraction, by estimation, is 55 to 60%. The  left ventricle has normal function. Left ventricular diastolic function  could not be evaluated.   2. Right ventricular systolic function is normal. The right ventricular  size is normal.   3. No left atrial/left atrial appendage thrombus was detected.   4. A small pericardial effusion is present. The pericardial effusion is  localized near the right atrium and anterior to the right ventricle.   5. Small mobile mass noted on the anterior mitral leaflet ( best seen  frame 77) suspected to be a vegetation in the setting of bactermia. Mild  mitral valve regurgitation. No evidence of mitral stenosis.   6. Mobile mass on the tricuspid valve - suspect this may be on the  chordae not clearly seen on the valve leaflet, suspected to be a  vegetation in the setting of bactermia.   7. Very small oscillating mass on the left coronary cusp of the aortic  valve suspected to be a vegetation in the setting of bactermia. Aortic  valve regurgitation is trivial. No aortic stenosis is present.   8. Mild pulmonic stenosis.     Neuro/Psych Seizures -, Well Controlled,     GI/Hepatic negative GI ROS, Neg liver ROS,,,  Endo/Other  diabetes (Last A1c of 6.6), Type 2,  Insulin  Dependent    Renal/GU ESRFRenal disease     Musculoskeletal   Abdominal   Peds  Hematology  (+) Blood dyscrasia, anemia   Anesthesia Other Findings Legally Blind  Reproductive/Obstetrics                              Anesthesia Physical Anesthesia Plan  ASA: 4  Anesthesia Plan: Regional   Post-op Pain Management:    Induction: Intravenous  PONV Risk Score and Plan: 1 and Treatment may vary due to age or medical condition and Propofol  infusion  Airway Management Planned: Natural Airway and Simple Face Mask  Additional Equipment: None  Intra-op Plan:   Post-operative Plan: Extubation in OR  Informed Consent:      Dental advisory given  Plan Discussed with: CRNA  Anesthesia Plan Comments: (PAT note by Lynwood Hope, PA-C: 38 year old male with pertinent history including HTN, IDDM 2 (A1c 6.5 on 06/18/2024), legal blindness, ESRD on HD via TDC TTS, HFpEF, hypertrophic cardiomyopathy.  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  He was seen by CT surgery and not felt to meet criteria for operative intervention.  Recommended observation and IV antibiotics.  Patient has not had follow-up with cardiology or repeat echocardiogram since discharge on 10/30/2022.  History reviewed with multiple anesthesiologist.  Consensus opinion to proceed as planned barring acute status change.  Patient will be evaluated day of surgery by  assigned anesthesiologist.  EKG 12/30/2023: Sinus rhythm.  Rate 71.  Abnormal T, consider ischemia, lateral leads.  Prolonged QT interval (QTc 523)  TEE 10/28/2022: 1. Left ventricular ejection fraction, by estimation, is 55 to 60%. The  left ventricle has normal function. Left ventricular diastolic function  could not be evaluated.  2. Right ventricular systolic function is normal. The right ventricular  size is normal.   3. No left atrial/left atrial appendage thrombus was detected.  4. A small pericardial effusion is present. The pericardial effusion is  localized near the right atrium and anterior to the right ventricle.  5. Small mobile mass noted on the anterior mitral leaflet ( best seen  frame 77) suspected to be a vegetation in the setting of bactermia. Mild  mitral valve regurgitation. No evidence of mitral stenosis.  6. Mobile mass on the tricuspid valve - suspect this may be on the  chordae not clearly seen on the valve leaflet, suspected to be a  vegetation in the setting of bactermia.  7. Very small oscillating mass on the left coronary cusp of the aortic  valve suspected to be a vegetation in the setting of bactermia. Aortic  valve regurgitation is trivial. No aortic stenosis is present.  8. Mild pulmonic stenosis.   Conclusion(s)/Recommendation(s): Findings are concerning for  vegetation/infective endocarditis as detailed above.     UPDATE: EKG unchanged since prior in June 2024. Bedside POCUS demonstrates normal biventricular function with mild-to-moderate concentric LVH. No pericardial effusions. No obvious vegetations on valves. Plan to proceed with Regional anesthesia.   )         Anesthesia Quick Evaluation

## 2024-06-21 NOTE — Progress Notes (Signed)
 Pt states that he is legally blind He asked if I could email the surgical instructions and he would have someone to help him go over them at home. Pt states that [facility] will be with him tomorrow and will be able to stay with him for 24 hours after anesthesia

## 2024-06-21 NOTE — Progress Notes (Signed)
 Anesthesia Chart Review: Same day workup  38 year old male with pertinent history including HTN, IDDM 2 (A1c 6.5 on 06/18/2024), legal blindness, ESRD on HD via TDC TTS, HFpEF, hypertrophic cardiomyopathy.  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  He was seen by CT surgery and not felt to meet criteria for operative intervention.  Recommended observation and IV antibiotics.  Patient has not had follow-up with cardiology or repeat echocardiogram since discharge on 10/30/2022.  History reviewed with multiple anesthesiologist.  Consensus opinion to proceed as planned barring acute status change.  Patient will be evaluated day of surgery by assigned anesthesiologist.  EKG 12/30/2023: Sinus rhythm.  Rate 71.  Abnormal T, consider ischemia, lateral leads.  Prolonged QT interval (QTc 523)  TEE 10/28/2022: 1. Left ventricular ejection fraction, by estimation, is 55 to 60%. The  left ventricle has normal function. Left ventricular diastolic function  could not be evaluated.   2. Right ventricular systolic function is normal. The right ventricular  size is normal.   3. No left atrial/left atrial appendage thrombus was detected.   4. A small pericardial effusion is present. The pericardial effusion is  localized near the right atrium and anterior to the right ventricle.   5. Small mobile mass noted on the anterior mitral leaflet ( best seen  frame 77) suspected to be a vegetation in the setting of bactermia. Mild  mitral valve regurgitation. No evidence of mitral stenosis.   6. Mobile mass on the tricuspid valve - suspect this may be on the  chordae not clearly seen on the valve leaflet, suspected to be a  vegetation in the setting of bactermia.   7. Very small oscillating mass on the left coronary cusp of the aortic  valve suspected to be a vegetation in the setting of bactermia. Aortic  valve  regurgitation is trivial. No aortic stenosis is present.   8. Mild pulmonic stenosis.   Conclusion(s)/Recommendation(s): Findings are concerning for  vegetation/infective endocarditis as detailed above.      Lynwood Geofm RIGGERS [facility]/Anesthesiology Phone [phone] 06/21/2024 10:42 AM

## 2024-06-21 NOTE — Progress Notes (Signed)
 PCP - Amy Massey, NP Cardiologist - Dr. Oneil Parchment  PPM/ICD - denies   Chest x-ray - 08/01/23 EKG - 12/30/23 Stress Test - denies ECHO - 10/28/22 Cardiac Cath - denies  CPAP - denies  Fasting Blood Sugar - 100-120 Checks Blood Sugar every other day  ASA/Blood Thinner Instructions: n/a   ERAS Protcol - no, NPO  COVID TEST- n/a  Anesthesia review: yes, hx of endocarditis  Patient verbally denies any shortness of breath, fever, cough and chest pain during phone call      Questions were answered. Patient verbalized understanding of instructions.

## 2024-06-21 NOTE — Pre-Procedure Instructions (Signed)
 -------------  SDW INSTRUCTIONS given:  Your procedure is scheduled on 12/12.  Report to [facility] Main Entrance A at 05:30 A.M., and check in at the Admitting office.  Any questions or running late day of surgery: call [phone]    Remember:  Do not eat or drink after midnight the night before your surgery     Take these medicines the morning of surgery with A SIP OF WATER   Clonidine  Labetalol  pantoprazole     As of today, STOP taking any Aspirin (unless otherwise instructed by your surgeon) Aleve , Naproxen , Ibuprofen, Motrin, Advil, Goody's, BC's, all herbal medications, fish oil, and all vitamins.  WHAT DO I DO ABOUT MY DIABETES MEDICATION?  THE NIGHT BEFORE SURGERY, take 3.5 units (50%) of  insulin  glargine-yfgn (SEMGLEE )      If your CBG is greater than 220 mg/dL, you may take  of your sliding scale (correction) dose of insulin  lispro (HUMALOG )    HOW TO MANAGE YOUR DIABETES BEFORE AND AFTER SURGERY  Why is it important to control my blood sugar before and after surgery? Improving blood sugar levels before and after surgery helps healing and can limit problems. A way of improving blood sugar control is eating a healthy diet by:  Eating less sugar and carbohydrates  Increasing activity/exercise  Talking with your doctor about reaching your blood sugar goals High blood sugars (greater than 180 mg/dL) can raise your risk of infections and slow your recovery, so you will need to focus on controlling your diabetes during the weeks before surgery. Make sure that the doctor who takes care of your diabetes knows about your planned surgery including the date and location.  How do I manage my blood sugar before surgery? Check your blood sugar at least 4 times a day, starting 2 days before surgery, to make sure that the level is not too high or low.  Check your blood sugar the morning of your surgery when you wake up and every 2 hours until you get to the Short Stay  unit.  If your blood sugar is less than 70 mg/dL, you will need to treat for low blood sugar: Do not take insulin . Treat a low blood sugar (less than 70 mg/dL) with  cup of clear juice (cranberry or apple), 4 glucose tablets, OR glucose gel. Recheck blood sugar in 15 minutes after treatment (to make sure it is greater than 70 mg/dL). If your blood sugar is not greater than 70 mg/dL on recheck, call [phone] for further instructions. Report your blood sugar to the short stay nurse when you get to Short Stay.  If you are admitted to the hospital after surgery: Your blood sugar will be checked by the staff and you will probably be given insulin  after surgery (instead of oral diabetes medicines) to make sure you have good blood sugar levels. The goal for blood sugar control after surgery is 80-180 mg/dL.   Do NOT Smoke (Tobacco/Vaping) 24 hours prior to your procedure  If you use a CPAP at night, you may bring all equipment for your overnight stay.     You will be asked to remove any contacts, glasses, piercing's, hearing aid's, dentures/partials prior to surgery. Please bring cases for these items if needed.     Patients discharged the day of surgery will not be allowed to drive home, and someone needs to stay with them for 24 hours.  SURGICAL WAITING ROOM VISITATION Patients may have no more than 2 support people in the waiting area -  these visitors may rotate.   Pre-op  nurse will coordinate an appropriate time for 1 ADULT support person, who may not rotate, to accompany patient in pre-op .  Children under the age of 71 must have an adult with them who is not the patient and must remain in the main waiting area with an adult.  If the patient needs to stay at the hospital during part of their recovery, the visitor guidelines for inpatient rooms apply.  Please refer to the [facility] website for the visitor guidelines for any additional information.   Special instructions:   [facility]- Preparing For Surgery   Please follow these instructions carefully.   Shower the NIGHT BEFORE SURGERY and the MORNING OF SURGERY with DIAL  Soap.   Pat yourself dry with a CLEAN TOWEL.  Wear CLEAN PAJAMAS to bed the night before surgery  Place CLEAN SHEETS on your bed the night of your first shower and DO NOT SLEEP WITH PETS.   Additional instructions for the day of surgery: DO NOT APPLY any lotions, deodorants, cologne, or perfumes.   Do not wear jewelry or makeup Do not wear nail polish, gel polish, artificial nails, or any other type of covering on natural nails (fingers and toes) Do not bring valuables to the hospital. [facility] is not responsible for valuables/personal belongings. Put on clean/comfortable clothes.  Please brush your teeth.  Ask your nurse before applying any prescription medications to the skin.

## 2024-06-22 ENCOUNTER — Encounter (HOSPITAL_COMMUNITY): Admission: RE | Disposition: A | Payer: Self-pay | Source: Home / Self Care | Attending: Vascular Surgery

## 2024-06-22 ENCOUNTER — Ambulatory Visit (HOSPITAL_COMMUNITY): Admitting: Physician Assistant

## 2024-06-22 ENCOUNTER — Ambulatory Visit (HOSPITAL_COMMUNITY)
Admission: RE | Admit: 2024-06-22 | Discharge: 2024-06-22 | Disposition: A | Attending: Vascular Surgery | Admitting: Vascular Surgery

## 2024-06-22 ENCOUNTER — Encounter (HOSPITAL_COMMUNITY): Payer: Self-pay | Admitting: Vascular Surgery

## 2024-06-22 DIAGNOSIS — Z794 Long term (current) use of insulin: Secondary | ICD-10-CM | POA: Diagnosis not present

## 2024-06-22 DIAGNOSIS — N186 End stage renal disease: Secondary | ICD-10-CM | POA: Insufficient documentation

## 2024-06-22 DIAGNOSIS — T82898A Other specified complication of vascular prosthetic devices, implants and grafts, initial encounter: Secondary | ICD-10-CM | POA: Diagnosis not present

## 2024-06-22 DIAGNOSIS — I132 Hypertensive heart and chronic kidney disease with heart failure and with stage 5 chronic kidney disease, or end stage renal disease: Secondary | ICD-10-CM | POA: Diagnosis not present

## 2024-06-22 DIAGNOSIS — I422 Other hypertrophic cardiomyopathy: Secondary | ICD-10-CM | POA: Diagnosis not present

## 2024-06-22 DIAGNOSIS — T82868A Thrombosis of vascular prosthetic devices, implants and grafts, initial encounter: Secondary | ICD-10-CM | POA: Diagnosis not present

## 2024-06-22 DIAGNOSIS — H548 Legal blindness, as defined in USA: Secondary | ICD-10-CM | POA: Insufficient documentation

## 2024-06-22 DIAGNOSIS — Y832 Surgical operation with anastomosis, bypass or graft as the cause of abnormal reaction of the patient, or of later complication, without mention of misadventure at the time of the procedure: Secondary | ICD-10-CM | POA: Insufficient documentation

## 2024-06-22 DIAGNOSIS — D631 Anemia in chronic kidney disease: Secondary | ICD-10-CM | POA: Insufficient documentation

## 2024-06-22 DIAGNOSIS — Z79899 Other long term (current) drug therapy: Secondary | ICD-10-CM | POA: Insufficient documentation

## 2024-06-22 DIAGNOSIS — I5032 Chronic diastolic (congestive) heart failure: Secondary | ICD-10-CM | POA: Insufficient documentation

## 2024-06-22 DIAGNOSIS — E1122 Type 2 diabetes mellitus with diabetic chronic kidney disease: Secondary | ICD-10-CM | POA: Diagnosis not present

## 2024-06-22 DIAGNOSIS — Z992 Dependence on renal dialysis: Secondary | ICD-10-CM | POA: Diagnosis not present

## 2024-06-22 HISTORY — PX: AV FISTULA PLACEMENT: SHX1204

## 2024-06-22 HISTORY — PX: REVISON OF ARTERIOVENOUS FISTULA: SHX6074

## 2024-06-22 LAB — POCT I-STAT, CHEM 8
BUN: 39 mg/dL — ABNORMAL HIGH (ref 6–20)
Calcium, Ion: 0.83 mmol/L — CL (ref 1.15–1.40)
Chloride: 99 mmol/L (ref 98–111)
Creatinine, Ser: 11.2 mg/dL — ABNORMAL HIGH (ref 0.61–1.24)
Glucose, Bld: 111 mg/dL — ABNORMAL HIGH (ref 70–99)
HCT: 35 % — ABNORMAL LOW (ref 39.0–52.0)
Hemoglobin: 11.9 g/dL — ABNORMAL LOW (ref 13.0–17.0)
Potassium: 4 mmol/L (ref 3.5–5.1)
Sodium: 136 mmol/L (ref 135–145)
TCO2: 23 mmol/L (ref 22–32)

## 2024-06-22 LAB — GLUCOSE, CAPILLARY
Glucose-Capillary: 124 mg/dL — ABNORMAL HIGH (ref 70–99)
Glucose-Capillary: 137 mg/dL — ABNORMAL HIGH (ref 70–99)
Glucose-Capillary: 119 mg/dL — ABNORMAL HIGH (ref 70–99)

## 2024-06-22 SURGERY — ARTERIOVENOUS (AV) FISTULA CREATION
Anesthesia: Regional | Laterality: Left

## 2024-06-22 MED ORDER — ORAL CARE MOUTH RINSE: 15.0000 mL | Freq: Once | OROMUCOSAL | Status: AC

## 2024-06-22 MED ORDER — AMISULPRIDE (ANTIEMETIC) 5 MG/2ML IV SOLN: 10.0000 mg | Freq: Once | INTRAVENOUS | Status: DC | PRN

## 2024-06-22 MED ORDER — INSULIN ASPART 100 UNIT/ML IJ SOLN
0.0000 [IU] | INTRAMUSCULAR | Status: DC | PRN
  Filled 2024-06-22: qty 0.07

## 2024-06-22 MED ORDER — OXYCODONE HCL 5 MG/5ML PO SOLN: 5.0000 mg | Freq: Once | ORAL | Status: DC | PRN

## 2024-06-22 MED ORDER — PHENYLEPHRINE 80 MCG/ML (10ML) SYRINGE FOR IV PUSH (FOR BLOOD PRESSURE SUPPORT)
PREFILLED_SYRINGE | INTRAVENOUS | Status: DC | PRN
  Administered 2024-06-22: 80 ug via INTRAVENOUS

## 2024-06-22 MED ORDER — ACETAMINOPHEN 10 MG/ML IV SOLN: 1000.0000 mg | Freq: Once | INTRAVENOUS | Status: DC | PRN

## 2024-06-22 MED ORDER — MIDAZOLAM HCL (PF) 2 MG/2ML IJ SOLN: 2.0000 mg | Freq: Once | INTRAMUSCULAR | Status: AC

## 2024-06-22 MED ORDER — FENTANYL CITRATE (PF) 100 MCG/2ML IJ SOLN
INTRAMUSCULAR | Status: AC
  Administered 2024-06-22: 100 ug via INTRAVENOUS
  Filled 2024-06-22: qty 2

## 2024-06-22 MED ORDER — PHENYLEPHRINE HCL (PRESSORS) 10 MG/ML IV SOLN
INTRAVENOUS | Status: AC
  Filled 2024-06-22: qty 1

## 2024-06-22 MED ORDER — CHLORHEXIDINE GLUCONATE 4 % EX SOLN: 60.0000 mL | Freq: Once | CUTANEOUS | Status: DC

## 2024-06-22 MED ORDER — PROPOFOL 500 MG/50ML IV EMUL
INTRAVENOUS | Status: DC | PRN
  Administered 2024-06-22 (×3): 20 mg via INTRAVENOUS
  Administered 2024-06-22: 50 ug/kg/min via INTRAVENOUS

## 2024-06-22 MED ORDER — OXYCODONE-ACETAMINOPHEN 5-325 MG PO TABS: 1.0000 | ORAL_TABLET | ORAL | 0 refills | Status: AC | PRN

## 2024-06-22 MED ORDER — PHENYLEPHRINE HCL-NACL 20-0.9 MG/250ML-% IV SOLN
INTRAVENOUS | Status: DC | PRN
  Administered 2024-06-22: 20 ug/min via INTRAVENOUS

## 2024-06-22 MED ORDER — MIDAZOLAM HCL 2 MG/2ML IJ SOLN
INTRAMUSCULAR | Status: AC
  Administered 2024-06-22: 2 mg via INTRAVENOUS
  Filled 2024-06-22: qty 2

## 2024-06-22 MED ORDER — LIDOCAINE HCL (CARDIAC) PF 100 MG/5ML IV SOSY
PREFILLED_SYRINGE | INTRAVENOUS | Status: DC | PRN
  Administered 2024-06-22: 20 mg via INTRAVENOUS

## 2024-06-22 MED ORDER — HEPARIN 6000 UNIT IRRIGATION SOLUTION
Status: DC | PRN
  Administered 2024-06-22: 1

## 2024-06-22 MED ORDER — CHLORHEXIDINE GLUCONATE 0.12 % MT SOLN
15.0000 mL | Freq: Once | OROMUCOSAL | Status: AC
  Administered 2024-06-22: 15 mL via OROMUCOSAL
  Filled 2024-06-22: qty 15

## 2024-06-22 MED ORDER — CEFAZOLIN SODIUM-DEXTROSE 2-4 GM/100ML-% IV SOLN
2.0000 g | INTRAVENOUS | Status: AC
  Administered 2024-06-22: 2 g via INTRAVENOUS
  Filled 2024-06-22: qty 100

## 2024-06-22 MED ORDER — FENTANYL CITRATE (PF) 100 MCG/2ML IJ SOLN: 100.0000 ug | Freq: Once | INTRAMUSCULAR | Status: AC

## 2024-06-22 MED ORDER — DEXMEDETOMIDINE HCL IN NACL 80 MCG/20ML IV SOLN
INTRAVENOUS | Status: DC | PRN
  Administered 2024-06-22 (×2): 10 ug via INTRAVENOUS

## 2024-06-22 MED ORDER — OXYCODONE HCL 5 MG PO TABS: 5.0000 mg | ORAL_TABLET | Freq: Once | ORAL | Status: DC | PRN

## 2024-06-22 MED ORDER — SODIUM CHLORIDE 0.9 % IV SOLN: INTRAVENOUS | Status: DC

## 2024-06-22 MED ORDER — FENTANYL CITRATE (PF) 100 MCG/2ML IJ SOLN: 25.0000 ug | INTRAMUSCULAR | Status: DC | PRN

## 2024-06-22 MED ORDER — MEPIVACAINE HCL (PF) 1.5 % IJ SOLN
INTRAMUSCULAR | Status: DC | PRN
  Administered 2024-06-22: 27 mL via EPIDURAL

## 2024-06-22 MED ADMIN — HEMOSTATIC AGENTS (NO CHARGE) OPTIME: 1 | TOPICAL | NDC 99999080054

## 2024-06-22 MED ADMIN — Sodium Chloride Irrigation Soln 0.9%: 1000 mL | NDC 99999050048

## 2024-06-22 MED FILL — Phenylephrine-NaCl IV Solution 20 MG/250ML-0.9%: INTRAVENOUS | Qty: 500 | Status: AC

## 2024-06-22 MED FILL — Heparin Sodium (Porcine) Inj 1000 Unit/ML: Qty: 500 | Status: AC

## 2024-06-22 MED FILL — Propofol IV Emul 200 MG/20ML (10 MG/ML): INTRAVENOUS | Qty: 20 | Status: AC

## 2024-06-22 SURGICAL SUPPLY — 32 items
ARMBAND PINK RESTRICT EXTREMIT (MISCELLANEOUS) ×1 IMPLANT
BAG COUNTER SPONGE SURGICOUNT (BAG) ×1 IMPLANT
BNDG ELASTIC 4INX 5YD STR LF (GAUZE/BANDAGES/DRESSINGS) IMPLANT
CANISTER SUCTION 3000ML PPV (SUCTIONS) ×1 IMPLANT
CLIP LIGATING EXTRA MED SLVR (CLIP) ×1 IMPLANT
CLIP LIGATING EXTRA SM BLUE (MISCELLANEOUS) ×1 IMPLANT
COVER PROBE W GEL 5X96 (DRAPES) ×1 IMPLANT
DERMABOND ADVANCED .7 DNX12 (GAUZE/BANDAGES/DRESSINGS) ×1 IMPLANT
DRSG ADAPTIC 3X8 NADH LF (GAUZE/BANDAGES/DRESSINGS) IMPLANT
DRSG TEGADERM 4X4.75 (GAUZE/BANDAGES/DRESSINGS) IMPLANT
ELECTRODE REM PT RTRN 9FT ADLT (ELECTROSURGICAL) ×1 IMPLANT
GAUZE SPONGE 4X4 12PLY STRL (GAUZE/BANDAGES/DRESSINGS) IMPLANT
GLOVE BIO SURGEON STRL SZ7.5 (GLOVE) ×1 IMPLANT
GOWN STRL REUS W/ TWL LRG LVL3 (GOWN DISPOSABLE) ×2 IMPLANT
GOWN STRL REUS W/ TWL XL LVL3 (GOWN DISPOSABLE) ×1 IMPLANT
INSERT FOGARTY SM (MISCELLANEOUS) IMPLANT
KIT BASIN OR (CUSTOM PROCEDURE TRAY) ×1 IMPLANT
KIT TURNOVER KIT B (KITS) ×1 IMPLANT
LOOP VESSEL MINI RED (MISCELLANEOUS) IMPLANT
PACK CV ACCESS (CUSTOM PROCEDURE TRAY) ×1 IMPLANT
PAD ARMBOARD POSITIONER FOAM (MISCELLANEOUS) ×2 IMPLANT
POWDER SURGICEL 3.0 GRAM (HEMOSTASIS) IMPLANT
SLING ARM FOAM STRAP LRG (SOFTGOODS) IMPLANT
SOLN 0.9% NACL POUR BTL 1000ML (IV SOLUTION) ×1 IMPLANT
SOLN STERILE WATER BTL 1000 ML (IV SOLUTION) ×1 IMPLANT
STAPLER SKIN PROX 35W (STAPLE) IMPLANT
SUT MNCRL AB 4-0 PS2 18 (SUTURE) ×1 IMPLANT
SUT PROLENE 5 0 C 1 24 (SUTURE) IMPLANT
SUT PROLENE 6 0 BV (SUTURE) ×1 IMPLANT
SUT VIC AB 3-0 SH 27X BRD (SUTURE) ×1 IMPLANT
TOWEL GREEN STERILE (TOWEL DISPOSABLE) ×1 IMPLANT
UNDERPAD 30X36 HEAVY ABSORB (UNDERPADS AND DIAPERS) ×1 IMPLANT

## 2024-06-22 NOTE — Anesthesia Procedure Notes (Signed)
 Anesthesia Regional Block: Supraclavicular block   Pre-Anesthetic Checklist: , timeout performed,  Correct Patient, Correct Site, Correct Laterality,  Correct Procedure, Correct Position, site marked,  Risks and benefits discussed,  Surgical consent,  Pre-op  evaluation,  At surgeon's request and post-op pain management  Laterality: Upper and Lower  Prep: chloraprep       Needles:  Injection technique: Single-shot  Needle Type: Echogenic Stimulator Needle     Needle Length: 10cm  Needle Gauge: 20     Additional Needles:   Procedures:,,,, ultrasound used (permanent image in chart),, #20gu IV placed    Narrative:  Start time: 06/22/2024 9:00 AM End time: 06/22/2024 9:00 AM Injection made incrementally with aspirations every 5 mL.  Performed by: Personally  Anesthesiologist: Colhoun, Lauraine DASEN, MD

## 2024-06-22 NOTE — Op Note (Signed)
 Patient name: [name] MRN: [id number] DOB: 17-Apr-1986 Sex: male  06/22/2024 Pre-operative Diagnosis: End-stage renal disease, thrombosed left arm radiocephalic AV fistula Post-operative diagnosis:  Same Surgeon:  Penne BROCKS. Sheree, MD Assistant: Teretha Damme, PA Procedure Performed: 1.  Ligation and excision left arm radiocephalic AV fistula 2.  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  He is now indicated for upper arm access as he wants to avoid access and the right upper extremity.  Given that his left forearm AV fistula is thrombosed with very large pseudoaneurysms we have also discussed ligation and consideration of pseudoaneurysm excision.  In experience assistant was necessary to facilitate exposure of the brachial artery and cephalic vein at the antecubitum with creation of new fistula as well as difficult exposure of the existing fistula with ligation and excision of 2 large pseudoaneurysms in the forearm.  Findings: In the upper arm the basilic vein and cephalic vein were fused at the antecubitum.  The basilic vein was ligated can be used above the antecubitum for a fistula in the future if necessary.  The cephalic vein was used as fistula and there was strong thrill throughout the upper arm at completion confirmed with Doppler.  The existing fistula in the forearm was patent proximally and was ligated and at completion there was a much stronger radial artery pulse after ligation that was also confirmed with Doppler.  The pseudoaneurysms were excised and were very large.  At completion there was a strong thrill in the upper arm fistula and palpable radial artery pulse at the wrist.   Procedure:  The patient was identified in the holding area and taken to the  operating room where is placed supine upper table and MAC anesthesia was induced.  He was sterilely prepped and draped in the left upper extremity in the usual fashion, antibiotics were administered and a timeout was called.  The upper arm was evaluated with ultrasound we identified the existing arterial venous anastomosis as well as what appeared to be a suitable basilic and cephalic vein in the upper arm.  The existing block was checked and noted to be intact.  We then made a transverse incision at the antecubitum and dissected down and identified what appeared to be a joint basilic and cephalic vein.  We dissected through the deep fascia identified the brachial artery and encircled this with vessel loop.  Further down the forearm we then dissected out the common median cubital vein and ligated this and transected it.  We then dilated up to 4 mm serially and flushed with heparinized saline and the vein was spatulated and then clamped.  The artery was clamped distally and proximally and opened longitudinally and flushed with heparinized saline in both directions.  The vein was then sewn end to side with 6-0 Prolene suture and prior to completion we allow flushing all directions.  Upon completion there was a very strong thrill throughout the upper arm confirmed with Doppler.  We obtained hemostasis in that wound and then turned our attention toward excising the very large fistula in the forearm.  Again the anastomosis was identified with ultrasound we made a longitudinal incision at that level and as well as a separate longitudinal incision over the second pseudoaneurysm.  The fistula is dissected back to the anastomosis.  The proximal fistula was clamped the radial artery was then palpable and confirmed with Doppler flow.  It was transected and the proximal fistula was oversewn with running 5-0 Prolene suture in a mattress fashion.  We then tediously dissected out the pseudoaneurysms through the 2 incisions and  also through the previously created fistula incision we were able to ligate the fistula higher.  Branches were divided between ties.  Ultimately the entire fistula was resected.  We obtain hemostasis in the wound and then irrigated.  We closed all 3 incisions with running Vicryl suture followed by staples.  He was then awakened from anesthesia having tolerated the procedure well without immediate complication.  All counts were correct at completion.  EBL: 200 cc   Sara Keys C. Sheree, MD [facility] Office: [phone] Pager: [phone]

## 2024-06-22 NOTE — Discharge Instructions (Signed)
 [facility]  Discharge Instructions  AV Fistula or Graft Surgery for Dialysis Access  Please refer to the following instructions for your post-procedure care. Your surgeon or physician assistant will discuss any changes with you.  Activity  You may drive the day following your surgery, if you are comfortable and no longer taking prescription pain medication. Resume full activity as the soreness in your incision resolves.  Bathing/Showering  You may shower after you go home. Keep your incision dry for 48 hours. Do not soak in a bathtub, hot tub, or swim until the incision heals completely. You may not shower if you have a hemodialysis catheter.  Incision Care  Clean your incision with mild soap and water  after 48 hours. Pat the area dry with a clean towel. You do not need a bandage unless otherwise instructed. Do not apply any ointments or creams to your incision. You may have skin glue on your incision. Do not peel it off. It will come off on its own in about one week. Your arm may swell a bit after surgery. To reduce swelling use pillows to elevate your arm so it is above your heart. Your doctor will tell you if you need to lightly wrap your arm with an ACE bandage.  Diet  Resume your normal diet. There are not special food restrictions following this procedure. In order to heal from your surgery, it is CRITICAL to get adequate nutrition. Your body requires vitamins, minerals, and protein. Vegetables are the best source of vitamins and minerals. Vegetables also provide the perfect balance of protein. Processed food has little nutritional value, so try to avoid this.  Medications  Resume taking all of your medications. If your incision is causing pain, you may take over-the counter pain relievers such as acetaminophen  (Tylenol ). If you were prescribed a stronger pain medication, please be aware these medications can cause nausea and constipation. Prevent  nausea by taking the medication with a snack or meal. Avoid constipation by drinking plenty of fluids and eating foods with high amount of fiber, such as fruits, vegetables, and grains.  Do not take Tylenol  if you are taking prescription pain medications.  Follow up Your surgeon may want to see you in the office following your access surgery. If so, this will be arranged at the time of your surgery.  Please call us  immediately for any of the following conditions:  Increased pain, redness, drainage (pus) from your incision site Fever of 101 degrees or higher Severe or worsening pain at your incision site Hand pain or numbness.  Reduce your risk of vascular disease:  Stop smoking. If you would like help, call [facility] at 1-800-QUIT-NOW (575-845-1098) or [place] at (403)146-3108  Manage your cholesterol Maintain a desired weight Control your diabetes Keep your blood pressure down  Dialysis  It will take several weeks to several months for your new dialysis access to be ready for use. Your surgeon will determine when it is okay to use it. Your nephrologist will continue to direct your dialysis. You can continue to use your Permcath until your new access is ready for use.   06/22/2024 [name] [id number] [birth year]  Surgeon(s): Sheree Penne Bruckner, MD  Procedures: brachiocephalic FISTULA CREATION Ligation and Excision of cephalic Fistula   May stick graft immediately   May stick graft on designated area only:   x Do not stick left Brachiocephalic AC fistula for 12 weeks    If you have any questions,  please call the office at [phone].

## 2024-06-22 NOTE — Anesthesia Postprocedure Evaluation (Signed)
 Anesthesia Post Note  Patient: [name]  Procedure(s) Performed: brachiocephalic FISTULA CREATION (Left) Ligation and Excision of cephalic Fistula (Left)     Patient location during evaluation: PACU Anesthesia Type: Regional Level of consciousness: awake Pain management: pain level controlled Vital Signs Assessment: post-procedure vital signs reviewed and stable Respiratory status: spontaneous breathing Cardiovascular status: blood pressure returned to baseline Postop Assessment: no apparent nausea or vomiting Anesthetic complications: no   No notable events documented.  Last Vitals:  Vitals:   06/22/24 1130 06/22/24 1145  BP: 112/78 133/89  Pulse: 64 66  Resp: 13 17  Temp:    SpO2: 97% 99%    Last Pain:  Vitals:   06/22/24 1145  TempSrc:   PainSc: 0-No pain                 Cashus Halterman T Colhoun

## 2024-06-22 NOTE — Transfer of Care (Signed)
 Immediate Anesthesia Transfer of Care Note  Patient: [name]  Procedure(s) Performed: brachiocephalic FISTULA CREATION (Left) Ligation and Excision of cephalic Fistula (Left)  Patient Location: PACU  Anesthesia Type:MAC combined with regional for post-op pain  Level of Consciousness: awake, alert , oriented, and patient cooperative  Airway & Oxygen  Therapy: Patient Spontanous Breathing  Post-op Assessment: Report given to RN and Post -op Vital signs reviewed and stable  Post vital signs: Reviewed and stable  Last Vitals:  Vitals Value Taken Time  BP 103/73 06/22/24 11:15  Temp    Pulse 66 06/22/24 11:17  Resp 17 06/22/24 11:17  SpO2 96 % 06/22/24 11:17  Vitals shown include unfiled device data.  Last Pain:  Vitals:   06/22/24 0800  TempSrc: Oral  PainSc:          Complications: No notable events documented.

## 2024-06-22 NOTE — Interval H&P Note (Signed)
 History and Physical Interval Note:  06/22/2024 7:41 AM  [name]  has presented today for surgery, with the diagnosis of ESRD.  The various methods of treatment have been discussed with the patient and family. After consideration of risks, benefits and other options for treatment, the patient has consented to  Procedures: ARTERIOVENOUS (AV) FISTULA CREATION (Left) INSERTION, GRAFT, ARTERIOVENOUS, UPPER EXTREMITY (Left) REVISON OF ARTERIOVENOUS FISTULA (Left) as a surgical intervention.  The patient's history has been reviewed, patient examined, no change in status, stable for surgery.  I have reviewed the patient's chart and labs.  Questions were answered to the patient's satisfaction.     [name]

## 2024-06-23 ENCOUNTER — Encounter (HOSPITAL_COMMUNITY): Payer: Self-pay | Admitting: Vascular Surgery

## 2024-07-02 ENCOUNTER — Other Ambulatory Visit: Payer: Self-pay

## 2024-07-02 DIAGNOSIS — N186 End stage renal disease: Secondary | ICD-10-CM

## 2024-07-14 ENCOUNTER — Encounter (HOSPITAL_COMMUNITY): Payer: Self-pay

## 2024-07-14 ENCOUNTER — Other Ambulatory Visit: Payer: Self-pay

## 2024-07-14 ENCOUNTER — Emergency Department (HOSPITAL_COMMUNITY)

## 2024-07-14 ENCOUNTER — Emergency Department (HOSPITAL_COMMUNITY)
Admission: EM | Admit: 2024-07-14 | Discharge: 2024-07-14 | Disposition: A | Discharge status: Home / Self Care | Source: Home / Self Care | Attending: Emergency Medicine | Admitting: Emergency Medicine

## 2024-07-14 DIAGNOSIS — R112 Nausea with vomiting, unspecified: Secondary | ICD-10-CM | POA: Insufficient documentation

## 2024-07-14 DIAGNOSIS — M791 Myalgia, unspecified site: Secondary | ICD-10-CM | POA: Insufficient documentation

## 2024-07-14 DIAGNOSIS — R6889 Other general symptoms and signs: Secondary | ICD-10-CM

## 2024-07-14 DIAGNOSIS — I10 Essential (primary) hypertension: Secondary | ICD-10-CM | POA: Insufficient documentation

## 2024-07-14 LAB — CBC WITH DIFFERENTIAL/PLATELET
Abs Immature Granulocytes: 0.04 K/uL (ref 0.00–0.07)
Basophils Absolute: 0 K/uL (ref 0.0–0.1)
Basophils Relative: 0 %
Eosinophils Absolute: 0 K/uL (ref 0.0–0.5)
Eosinophils Relative: 0 %
HCT: 35.4 % — ABNORMAL LOW (ref 39.0–52.0)
Hemoglobin: 11.3 g/dL — ABNORMAL LOW (ref 13.0–17.0)
Immature Granulocytes: 1 %
Lymphocytes Relative: 4 %
Lymphs Abs: 0.3 K/uL — ABNORMAL LOW (ref 0.7–4.0)
MCH: 27.8 pg (ref 26.0–34.0)
MCHC: 31.9 g/dL (ref 30.0–36.0)
MCV: 87 fL (ref 80.0–100.0)
Monocytes Absolute: 0.6 K/uL (ref 0.1–1.0)
Monocytes Relative: 7 %
Neutro Abs: 7.4 K/uL (ref 1.7–7.7)
Neutrophils Relative %: 88 %
Platelets: 236 K/uL (ref 150–400)
RBC: 4.07 MIL/uL — ABNORMAL LOW (ref 4.22–5.81)
RDW: 15.9 % — ABNORMAL HIGH (ref 11.5–15.5)
WBC: 8.4 K/uL (ref 4.0–10.5)
nRBC: 0 % (ref 0.0–0.2)

## 2024-07-14 LAB — RESP PANEL BY RT-PCR (RSV, FLU A&B, COVID)  RVPGX2
Influenza A by PCR: NEGATIVE
Influenza B by PCR: NEGATIVE
Resp Syncytial Virus by PCR: NEGATIVE
SARS Coronavirus 2 by RT PCR: NEGATIVE

## 2024-07-14 LAB — COMPREHENSIVE METABOLIC PANEL WITH GFR
ALT: 10 U/L (ref 0–44)
AST: 16 U/L (ref 15–41)
Albumin: 4.4 g/dL (ref 3.5–5.0)
Alkaline Phosphatase: 72 U/L (ref 38–126)
Anion gap: 19 — ABNORMAL HIGH (ref 5–15)
BUN: 49 mg/dL — ABNORMAL HIGH (ref 6–20)
CO2: 25 mmol/L (ref 22–32)
Calcium: 7.8 mg/dL — ABNORMAL LOW (ref 8.9–10.3)
Chloride: 94 mmol/L — ABNORMAL LOW (ref 98–111)
Creatinine, Ser: 12.1 mg/dL — ABNORMAL HIGH (ref 0.61–1.24)
GFR, Estimated: 5 mL/min — ABNORMAL LOW
Glucose, Bld: 142 mg/dL — ABNORMAL HIGH (ref 70–99)
Potassium: 4.1 mmol/L (ref 3.5–5.1)
Sodium: 138 mmol/L (ref 135–145)
Total Bilirubin: 0.4 mg/dL (ref 0.0–1.2)
Total Protein: 8.2 g/dL — ABNORMAL HIGH (ref 6.5–8.1)

## 2024-07-14 LAB — I-STAT CG4 LACTIC ACID, ED: Lactic Acid, Venous: 1.6 mmol/L (ref 0.5–1.9)

## 2024-07-14 LAB — CK: Total CK: 272 U/L (ref 49–397)

## 2024-07-14 LAB — MAGNESIUM: Magnesium: 2 mg/dL (ref 1.7–2.4)

## 2024-07-14 MED ORDER — METOCLOPRAMIDE HCL 5 MG/ML IJ SOLN
10.0000 mg | Freq: Once | INTRAMUSCULAR | Status: AC
  Administered 2024-07-14: 10 mg via INTRAVENOUS
  Filled 2024-07-14: qty 2

## 2024-07-14 MED ORDER — ACETAMINOPHEN 325 MG PO TABS
650.0000 mg | ORAL_TABLET | Freq: Once | ORAL | Status: AC
  Administered 2024-07-14: 650 mg via ORAL
  Filled 2024-07-14: qty 2

## 2024-07-14 MED ORDER — ONDANSETRON 4 MG PO TBDP: 4.0000 mg | ORAL_TABLET | Freq: Three times a day (TID) | ORAL | 0 refills | Status: AC | PRN

## 2024-07-14 MED ORDER — ONDANSETRON HCL 4 MG/2ML IJ SOLN
4.0000 mg | Freq: Once | INTRAMUSCULAR | Status: AC
  Administered 2024-07-14: 4 mg via INTRAVENOUS
  Filled 2024-07-14: qty 2

## 2024-07-14 NOTE — ED Provider Notes (Signed)
 Care assumed from Dr. Haze.  At time of transfer of care, patient is awaiting results of viral swab chest x-ray and to be reassessed.  Patient reportedly has a viral like illness with some nausea, vomiting, abdominal discomfort, malaise and fatigue all over.  He reportedly had a normal dialysis yesterday and even took some extra fluid off.  Shortly after transfer of care, viral testing returned negative negative for COVID/flu/RSV.  His x-ray also does not show pneumonia and shows his dialysis catheter appears to be in place.  Will reassess patient but if he is well-appearing, per previous team will likely discharge for outpatient follow-up for outpatient management of likely viral illness.  7:28 AM Patient reassessed send we went over all his findings.  He tells me he is feeling better and would like to go home.  We will print prescription for nausea medicine for him and we discussed that is likely viral given the otherwise negative workup and everything being similar to prior.  He agrees.  He does not have significant abdominal pain or tenderness on my exam now and his lungs seem clear.  Will hold on further imaging with CT and patient would like to leave.  Will discharge for outpatient follow-up with likely viral illness causing him symptoms with good return precautions and follow-up instructions.   Clinical Impression: 1. Flu-like symptoms   2. Nausea and vomiting, unspecified vomiting type     Disposition: Discharge  Condition: Good  I have discussed the results, Dx and Tx plan with the pt(& family if present). He/she/they expressed understanding and agree(s) with the plan. Discharge instructions discussed at great length. Strict return precautions discussed and pt &/or family have verbalized understanding of the instructions. No further questions at time of discharge.    New Prescriptions   ONDANSETRON  (ZOFRAN -ODT) 4 MG DISINTEGRATING TABLET    Take 1 tablet (4 mg total) by mouth  every 8 (eight) hours as needed for nausea or vomiting.    Follow Up: Jaycee Greig PARAS, NP [address] [place] [address] [phone]     [facility] Emergency Department at [facility] [address] [place]  [address] [phone]       Khalid Lacko, Lonni PARAS, MD 07/14/24 [phone]

## 2024-07-14 NOTE — ED Triage Notes (Addendum)
 Pt BIB [facility] from home. Pt had dialysis yesterday and had 3L taken off. About 0430 this morning pt started to have generalized sharp pains, mainly in his legs, back, and abdomen. Pt has been weak and c/o of  N/V. Hx HTN, did not take his meds last night. 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   [facility] 160/100BP 108P 99% RA

## 2024-07-14 NOTE — ED Provider Notes (Signed)
 " [facility] Provider Note   CSN: [id number] Arrival date & time: 07/14/24  9482     Patient presents with: Abdominal Pain, Nausea, and Generalized Body Aches   [name] is a 39 y.o. male.   Patient presents to the emergency department for evaluation of generalized myalgias, aches and pains with nausea and vomiting.  Patient reports that symptoms began 1 to 2 hours before coming to the emergency department.  He reports that it feels like when I had COVID.       Prior to Admission medications  Medication Sig Start Date End Date Taking? Authorizing Provider  atorvastatin  (LIPITOR) 40 MG tablet Take 1 tablet (40 mg total) by mouth daily. Patient taking differently: Take 40 mg by mouth daily after supper. 01/18/23 06/22/24  Melvenia Motto, MD  cloNIDine  (CATAPRES ) 0.1 MG tablet Take 1 tablet (0.1 mg total) by mouth 2 (two) times daily. 06/18/24   Jaycee Greig PARAS, NP  diphenhydramine -acetaminophen  (TYLENOL  PM) 25-500 MG TABS tablet Take 2 tablets by mouth 2 (two) times daily as needed (pain, sleep).    [provider]  insulin  glargine-yfgn (SEMGLEE ) 100 UNIT/ML injection Inject 0.12 mLs (12 Units total) into the skin at bedtime. Patient taking differently: Inject 7 Units into the skin at bedtime. 11/07/23   Jaycee Greig PARAS, NP  insulin  lispro (HUMALOG ) 100 UNIT/ML KwikPen INJECT PER SLIDING SCALE 3 TIMES DAILY WITH MEALS IF GLUCOSE IS 151-200=1 UNIT,201-250=2U, 251-300=3U,301-350=5U, 351-400=7U. CALL MD IF >400 06/18/24   Jaycee Greig PARAS, NP  labetalol  (NORMODYNE ) 200 MG tablet Take 1 tablet (200 mg total) by mouth 2 (two) times daily. 06/18/24 09/16/24  Jaycee Greig PARAS, NP  minoxidil  (LONITEN ) 2.5 MG tablet Take 2.5 mg by mouth 2 (two) times daily. 10/24/23   [provider]  Multiple Vitamins-Minerals (MULTIVITAMIN WITH MINERALS) tablet Take 1 tablet by mouth daily.    [provider]  oxyCODONE -acetaminophen  (PERCOCET) 5-325 MG  tablet Take 1 tablet by mouth every 4 (four) hours as needed for severe pain (pain score 7-10). 06/22/24 06/22/25  Baglia, Corrina, PA-C  pantoprazole  (PROTONIX ) 40 MG tablet TAKE 1 TABLET (40 MG TOTAL) BY MOUTH 2 (TWO) TIMES DAILY. 11/03/23 11/02/24  Shirlean Therisa ORN, NP  sodium zirconium cyclosilicate  (LOKELMA ) 10 g PACK packet Take 10 g by mouth See admin instructions. Take 1 packet (10g) once daily on non-dialysis days (Sunday, Monday, Wednesday, Friday)    [provider]  sucralfate  (CARAFATE ) 1 GM/10ML suspension TAKE 10 MLS (1 G TOTAL) BY MOUTH 4 (FOUR) TIMES DAILY. 02/13/24   Shirlean Therisa ORN, NP  SURE COMFORT PEN NEEDLES 31G X 8 MM MISC USE AS DIRECTED WITH INSULIN  PENS FOR INJECTION 02/13/24   Jaycee Greig PARAS, NP    Allergies: Apresoline  [hydralazine ] and Norvasc  [amlodipine ]    Review of Systems  Gastrointestinal:  Positive for nausea and vomiting.  Musculoskeletal:  Positive for myalgias.    Updated Vital Signs BP (!) 164/104   Pulse (!) 106   Temp 98.3 F (36.8 C)   Resp 16   Ht 6' (1.829 m)   Wt 90.7 kg   SpO2 97%   BMI 27.12 kg/m   Physical Exam Vitals and nursing note reviewed.  Constitutional:      General: He is not in acute distress.    Appearance: He is well-developed.  HENT:     Head: Normocephalic and atraumatic.     Mouth/Throat:     Mouth: Mucous membranes are  moist.  Eyes:     General: Vision grossly intact. Gaze aligned appropriately.     Extraocular Movements: Extraocular movements intact.     Conjunctiva/sclera: Conjunctivae normal.  Cardiovascular:     Rate and Rhythm: Normal rate and regular rhythm.     Pulses: Normal pulses.     Heart sounds: Normal heart sounds, S1 normal and S2 normal. No murmur heard.    No friction rub. No gallop.  Pulmonary:     Effort: Pulmonary effort is normal. No respiratory distress.     Breath sounds: Normal breath sounds.  Abdominal:     Palpations: Abdomen is soft.     Tenderness: There is no abdominal  tenderness. There is no guarding or rebound.     Hernia: No hernia is present.  Musculoskeletal:        General: No swelling.     Cervical back: Full passive range of motion without pain, normal range of motion and neck supple. No pain with movement, spinous process tenderness or muscular tenderness. Normal range of motion.     Right lower leg: No edema.     Left lower leg: No edema.  Skin:    General: Skin is warm and dry.     Capillary Refill: Capillary refill takes less than 2 seconds.     Findings: No ecchymosis, erythema, lesion or wound.  Neurological:     Mental Status: He is alert and oriented to person, place, and time.     GCS: GCS eye subscore is 4. GCS verbal subscore is 5. GCS motor subscore is 6.     Cranial Nerves: Cranial nerves 2-12 are intact.     Sensory: Sensation is intact.     Motor: Motor function is intact. No weakness or abnormal muscle tone.     Coordination: Coordination is intact.  Psychiatric:        Mood and Affect: Mood normal.        Speech: Speech normal.        Behavior: Behavior normal.     (all labs ordered are listed, but only abnormal results are displayed) Labs Reviewed  CBC WITH DIFFERENTIAL/PLATELET - Abnormal; Notable for the following components:      Result Value   RBC 4.07 (*)    Hemoglobin 11.3 (*)    HCT 35.4 (*)    RDW 15.9 (*)    Lymphs Abs 0.3 (*)    All other components within normal limits  COMPREHENSIVE METABOLIC PANEL WITH GFR - Abnormal; Notable for the following components:   Chloride 94 (*)    Glucose, Bld 142 (*)    BUN 49 (*)    Creatinine, Ser 12.10 (*)    Calcium  7.8 (*)    Total Protein 8.2 (*)    GFR, Estimated 5 (*)    Anion gap 19 (*)    All other components within normal limits  RESP PANEL BY RT-PCR (RSV, FLU A&B, COVID)  RVPGX2  MAGNESIUM  CK  I-STAT CG4 LACTIC ACID, ED    EKG: EKG Interpretation Date/Time:  Saturday July 14 2024 05:25:22 EST Ventricular Rate:  107 PR Interval:  181 QRS  Duration:  97 QT Interval:  359 QTC Calculation: 479 R Axis:   93  Text Interpretation: Sinus tachycardia Anterior infarct, old Nonspecific T abnormalities, lateral leads Confirmed by Haze Lonni PARAS [id number]) on 07/14/2024 5:32:06 AM  Radiology: No results found.   Procedures   Medications Ordered in the ED  acetaminophen  (TYLENOL ) tablet 650 mg (  650 mg Oral Given 07/14/24 9391)  ondansetron  (ZOFRAN ) injection 4 mg (4 mg Intravenous Given 07/14/24 0537)  metoCLOPramide  (REGLAN ) injection 10 mg (10 mg Intravenous Given 07/14/24 0537)                                    Medical Decision Making Amount and/or Complexity of Data Reviewed Labs: ordered. Decision-making details documented in ED Course. Radiology: ordered. ECG/medicine tests: ordered and independent interpretation performed. Decision-making details documented in ED Course.  Risk OTC drugs. Prescription drug management.   Differential Diagnosis considered includes, but not limited to: Cholelithiasis; cholecystitis; cholangitis; bowel obstruction; esophagitis; gastritis; peptic ulcer disease; pancreatitis; cardiac.  Presents to the emergency department with nausea, vomiting, generalized aches and myalgias that woke him from sleep around 4:30 AM.  Abdominal exam is nonfocal, no signs of peritonitis.  Patient afebrile at arrival.  He is a dialysis patient, had dialysis yesterday, successfully completed.  No signs of volume overload.  There is no leukocytosis or lactic acidosis to suggest significant bacterial infection.  Chemistries are at baseline.  Awaiting pulm NSTEMI testing, chest x-ray.  Will start oncoming ER physician to follow these results and reevaluate patient.     Final diagnoses:  Flu-like symptoms    ED Discharge Orders     None          Daniyal Tabor, Lonni PARAS, MD 07/14/24 [phone]  "

## 2024-07-14 NOTE — Discharge Instructions (Signed)
 Your history, exam, and evaluation today seem consistent with a viral illness causing your diffuse aches nausea vomiting malaise and fatigue.  Your x-ray did not show pneumonia and your viral testing showed you were negative for COVID/flu/RSV.  Please try to rest and stay hydrated and use the nausea medicine to achieve this.  Please continue outpatient management with your PCP.  If any symptoms change or worsen acutely, please return to the nearest emergency department for further workup.  We had a shared decision making conversation and agreed to hold on more extensive imaging workup today.

## 2024-07-15 ENCOUNTER — Encounter (HOSPITAL_COMMUNITY): Payer: Self-pay | Admitting: Internal Medicine

## 2024-07-15 ENCOUNTER — Inpatient Hospital Stay (HOSPITAL_COMMUNITY)
Admission: EM | Admit: 2024-07-15 | Discharge: 2024-07-20 | DRG: 288 | Disposition: A | Discharge status: Home / Self Care | Attending: Internal Medicine | Admitting: Internal Medicine

## 2024-07-15 ENCOUNTER — Emergency Department (HOSPITAL_COMMUNITY)

## 2024-07-15 ENCOUNTER — Other Ambulatory Visit: Payer: Self-pay

## 2024-07-15 DIAGNOSIS — I422 Other hypertrophic cardiomyopathy: Secondary | ICD-10-CM

## 2024-07-15 DIAGNOSIS — H548 Legal blindness, as defined in USA: Secondary | ICD-10-CM | POA: Diagnosis present

## 2024-07-15 DIAGNOSIS — Z794 Long term (current) use of insulin: Secondary | ICD-10-CM

## 2024-07-15 DIAGNOSIS — R1110 Vomiting, unspecified: Principal | ICD-10-CM

## 2024-07-15 DIAGNOSIS — D72829 Elevated white blood cell count, unspecified: Secondary | ICD-10-CM | POA: Diagnosis present

## 2024-07-15 DIAGNOSIS — D638 Anemia in other chronic diseases classified elsewhere: Secondary | ICD-10-CM | POA: Diagnosis not present

## 2024-07-15 DIAGNOSIS — R109 Unspecified abdominal pain: Secondary | ICD-10-CM | POA: Diagnosis not present

## 2024-07-15 DIAGNOSIS — Z604 Social exclusion and rejection: Secondary | ICD-10-CM | POA: Diagnosis present

## 2024-07-15 DIAGNOSIS — R9431 Abnormal electrocardiogram [ECG] [EKG]: Secondary | ICD-10-CM | POA: Diagnosis present

## 2024-07-15 DIAGNOSIS — K219 Gastro-esophageal reflux disease without esophagitis: Secondary | ICD-10-CM | POA: Diagnosis not present

## 2024-07-15 DIAGNOSIS — Z992 Dependence on renal dialysis: Secondary | ICD-10-CM

## 2024-07-15 DIAGNOSIS — E871 Hypo-osmolality and hyponatremia: Secondary | ICD-10-CM | POA: Diagnosis not present

## 2024-07-15 DIAGNOSIS — K3184 Gastroparesis: Secondary | ICD-10-CM | POA: Diagnosis present

## 2024-07-15 DIAGNOSIS — B9561 Methicillin susceptible Staphylococcus aureus infection as the cause of diseases classified elsewhere: Secondary | ICD-10-CM | POA: Diagnosis present

## 2024-07-15 DIAGNOSIS — Z8719 Personal history of other diseases of the digestive system: Secondary | ICD-10-CM

## 2024-07-15 DIAGNOSIS — Z8249 Family history of ischemic heart disease and other diseases of the circulatory system: Secondary | ICD-10-CM

## 2024-07-15 DIAGNOSIS — I330 Acute and subacute infective endocarditis: Principal | ICD-10-CM | POA: Diagnosis present

## 2024-07-15 DIAGNOSIS — Z1152 Encounter for screening for COVID-19: Secondary | ICD-10-CM

## 2024-07-15 DIAGNOSIS — E7800 Pure hypercholesterolemia, unspecified: Secondary | ICD-10-CM | POA: Diagnosis present

## 2024-07-15 DIAGNOSIS — N2581 Secondary hyperparathyroidism of renal origin: Secondary | ICD-10-CM | POA: Diagnosis present

## 2024-07-15 DIAGNOSIS — I160 Hypertensive urgency: Secondary | ICD-10-CM | POA: Diagnosis not present

## 2024-07-15 DIAGNOSIS — E1122 Type 2 diabetes mellitus with diabetic chronic kidney disease: Secondary | ICD-10-CM

## 2024-07-15 DIAGNOSIS — Z823 Family history of stroke: Secondary | ICD-10-CM

## 2024-07-15 DIAGNOSIS — Z833 Family history of diabetes mellitus: Secondary | ICD-10-CM

## 2024-07-15 DIAGNOSIS — Z89421 Acquired absence of other right toe(s): Secondary | ICD-10-CM

## 2024-07-15 DIAGNOSIS — I5032 Chronic diastolic (congestive) heart failure: Secondary | ICD-10-CM | POA: Diagnosis not present

## 2024-07-15 DIAGNOSIS — E1022 Type 1 diabetes mellitus with diabetic chronic kidney disease: Secondary | ICD-10-CM | POA: Diagnosis not present

## 2024-07-15 DIAGNOSIS — E1043 Type 1 diabetes mellitus with diabetic autonomic (poly)neuropathy: Secondary | ICD-10-CM | POA: Diagnosis present

## 2024-07-15 DIAGNOSIS — R112 Nausea with vomiting, unspecified: Secondary | ICD-10-CM | POA: Diagnosis present

## 2024-07-15 DIAGNOSIS — N186 End stage renal disease: Secondary | ICD-10-CM | POA: Diagnosis present

## 2024-07-15 DIAGNOSIS — Z79899 Other long term (current) drug therapy: Secondary | ICD-10-CM

## 2024-07-15 DIAGNOSIS — I132 Hypertensive heart and chronic kidney disease with heart failure and with stage 5 chronic kidney disease, or end stage renal disease: Secondary | ICD-10-CM | POA: Diagnosis present

## 2024-07-15 DIAGNOSIS — K2100 Gastro-esophageal reflux disease with esophagitis, without bleeding: Secondary | ICD-10-CM | POA: Diagnosis present

## 2024-07-15 DIAGNOSIS — E109 Type 1 diabetes mellitus without complications: Secondary | ICD-10-CM | POA: Diagnosis present

## 2024-07-15 DIAGNOSIS — I10 Essential (primary) hypertension: Secondary | ICD-10-CM

## 2024-07-15 DIAGNOSIS — D631 Anemia in chronic kidney disease: Secondary | ICD-10-CM | POA: Diagnosis present

## 2024-07-15 LAB — BLOOD CULTURE ID PANEL (REFLEXED) - BCID2

## 2024-07-15 LAB — RESPIRATORY PANEL BY PCR

## 2024-07-15 LAB — URINALYSIS, ROUTINE W REFLEX MICROSCOPIC
Bilirubin Urine: NEGATIVE
Glucose, UA: NEGATIVE mg/dL
Ketones, ur: NEGATIVE mg/dL
Nitrite: NEGATIVE
Protein, ur: NEGATIVE mg/dL
Specific Gravity, Urine: 1.004 — ABNORMAL LOW (ref 1.005–1.030)
pH: 5 (ref 5.0–8.0)

## 2024-07-15 LAB — LIPASE, BLOOD: Lipase: 13 U/L (ref 11–51)

## 2024-07-15 LAB — CBC
HCT: 33.1 % — ABNORMAL LOW (ref 39.0–52.0)
Hemoglobin: 10.9 g/dL — ABNORMAL LOW (ref 13.0–17.0)
MCH: 27.4 pg (ref 26.0–34.0)
MCHC: 32.9 g/dL (ref 30.0–36.0)
MCV: 83.2 fL (ref 80.0–100.0)
Platelets: 216 K/uL (ref 150–400)
RBC: 3.98 MIL/uL — ABNORMAL LOW (ref 4.22–5.81)
RDW: 16.3 % — ABNORMAL HIGH (ref 11.5–15.5)
WBC: 12.4 K/uL — ABNORMAL HIGH (ref 4.0–10.5)
nRBC: 0 % (ref 0.0–0.2)

## 2024-07-15 LAB — COMPREHENSIVE METABOLIC PANEL WITH GFR
ALT: 17 U/L (ref 0–44)
AST: 24 U/L (ref 15–41)
Albumin: 4.3 g/dL (ref 3.5–5.0)
Alkaline Phosphatase: 64 U/L (ref 38–126)
Anion gap: 23 — ABNORMAL HIGH (ref 5–15)
BUN: 66 mg/dL — ABNORMAL HIGH (ref 6–20)
CO2: 23 mmol/L (ref 22–32)
Calcium: 7.9 mg/dL — ABNORMAL LOW (ref 8.9–10.3)
Chloride: 90 mmol/L — ABNORMAL LOW (ref 98–111)
Creatinine, Ser: 14.8 mg/dL — ABNORMAL HIGH (ref 0.61–1.24)
GFR, Estimated: 4 mL/min — ABNORMAL LOW
Glucose, Bld: 172 mg/dL — ABNORMAL HIGH (ref 70–99)
Potassium: 3.7 mmol/L (ref 3.5–5.1)
Sodium: 135 mmol/L (ref 135–145)
Total Bilirubin: 0.4 mg/dL (ref 0.0–1.2)
Total Protein: 8.2 g/dL — ABNORMAL HIGH (ref 6.5–8.1)

## 2024-07-15 LAB — HEMOGLOBIN AND HEMATOCRIT, BLOOD
HCT: 29.5 % — ABNORMAL LOW (ref 39.0–52.0)
Hemoglobin: 9.5 g/dL — ABNORMAL LOW (ref 13.0–17.0)

## 2024-07-15 LAB — GLUCOSE, CAPILLARY
Glucose-Capillary: 181 mg/dL — ABNORMAL HIGH (ref 70–99)
Glucose-Capillary: 185 mg/dL — ABNORMAL HIGH (ref 70–99)

## 2024-07-15 LAB — CBG MONITORING, ED: Glucose-Capillary: 153 mg/dL — ABNORMAL HIGH (ref 70–99)

## 2024-07-15 MED ORDER — INSULIN GLARGINE-YFGN 100 UNIT/ML ~~LOC~~ SOLN: 7.0000 [IU] | Freq: Every day | SUBCUTANEOUS | Status: DC

## 2024-07-15 MED ORDER — SUCRALFATE 1 GM/10ML PO SUSP
1.0000 g | Freq: Four times a day (QID) | ORAL | Status: DC
  Administered 2024-07-15 – 2024-07-20 (×20): 1 g via ORAL
  Filled 2024-07-15 (×22): qty 10

## 2024-07-15 MED ORDER — PANTOPRAZOLE SODIUM 40 MG IV SOLR
40.0000 mg | Freq: Two times a day (BID) | INTRAVENOUS | Status: DC
  Administered 2024-07-15 – 2024-07-19 (×8): 40 mg via INTRAVENOUS
  Filled 2024-07-15 (×8): qty 10

## 2024-07-15 MED ORDER — INSULIN GLARGINE 100 UNIT/ML ~~LOC~~ SOLN
7.0000 [IU] | Freq: Every day | SUBCUTANEOUS | Status: DC
  Filled 2024-07-15: qty 0.07

## 2024-07-15 MED ORDER — DIPHENHYDRAMINE-APAP (SLEEP) 25-500 MG PO TABS: 2.0000 | ORAL_TABLET | Freq: Two times a day (BID) | ORAL | Status: DC | PRN

## 2024-07-15 MED ORDER — ONDANSETRON 4 MG PO TBDP
4.0000 mg | ORAL_TABLET | Freq: Once | ORAL | Status: AC
  Administered 2024-07-15: 4 mg via ORAL
  Filled 2024-07-15: qty 1

## 2024-07-15 MED ORDER — IOHEXOL 350 MG/ML SOLN
75.0000 mL | Freq: Once | INTRAVENOUS | Status: AC | PRN
  Administered 2024-07-15: 75 mL via INTRAVENOUS

## 2024-07-15 MED ORDER — CEFAZOLIN SODIUM-DEXTROSE 1-4 GM/50ML-% IV SOLN
1.0000 g | INTRAVENOUS | Status: DC
  Administered 2024-07-15 – 2024-07-19 (×5): 1 g via INTRAVENOUS
  Filled 2024-07-15 (×6): qty 50

## 2024-07-15 MED ORDER — FENTANYL CITRATE (PF) 50 MCG/ML IJ SOSY
25.0000 ug | PREFILLED_SYRINGE | INTRAMUSCULAR | Status: DC | PRN
  Administered 2024-07-15: 25 ug via INTRAVENOUS
  Filled 2024-07-15: qty 1

## 2024-07-15 MED ORDER — CLONIDINE HCL 0.1 MG PO TABS
0.1000 mg | ORAL_TABLET | Freq: Two times a day (BID) | ORAL | Status: DC
  Administered 2024-07-15 – 2024-07-20 (×9): 0.1 mg via ORAL
  Filled 2024-07-15 (×10): qty 1

## 2024-07-15 MED ORDER — FENTANYL CITRATE (PF) 50 MCG/ML IJ SOSY
50.0000 ug | PREFILLED_SYRINGE | Freq: Once | INTRAMUSCULAR | Status: AC
  Administered 2024-07-15: 50 ug via INTRAVENOUS
  Filled 2024-07-15: qty 1

## 2024-07-15 MED ORDER — DIPHENHYDRAMINE HCL 50 MG/ML IJ SOLN
12.5000 mg | Freq: Four times a day (QID) | INTRAMUSCULAR | Status: DC | PRN
  Administered 2024-07-15 (×2): 12.5 mg via INTRAVENOUS
  Filled 2024-07-15 (×2): qty 1

## 2024-07-15 MED ORDER — INSULIN GLARGINE-YFGN 100 UNIT/ML ~~LOC~~ SOLN
7.0000 [IU] | Freq: Every day | SUBCUTANEOUS | Status: DC
  Administered 2024-07-15 – 2024-07-20 (×5): 7 [IU] via SUBCUTANEOUS
  Filled 2024-07-15 (×7): qty 0.07

## 2024-07-15 MED ORDER — ACETAMINOPHEN 650 MG RE SUPP: 650.0000 mg | Freq: Four times a day (QID) | RECTAL | Status: DC | PRN

## 2024-07-15 MED ORDER — PANTOPRAZOLE SODIUM 40 MG IV SOLR
40.0000 mg | Freq: Once | INTRAVENOUS | Status: AC
  Administered 2024-07-15: 40 mg via INTRAVENOUS
  Filled 2024-07-15: qty 10

## 2024-07-15 MED ORDER — ONDANSETRON HCL 4 MG/2ML IJ SOLN
4.0000 mg | Freq: Once | INTRAMUSCULAR | Status: AC
  Administered 2024-07-15: 4 mg via INTRAVENOUS
  Filled 2024-07-15: qty 2

## 2024-07-15 MED ORDER — MINOXIDIL 2.5 MG PO TABS
2.5000 mg | ORAL_TABLET | Freq: Two times a day (BID) | ORAL | Status: DC
  Administered 2024-07-15 – 2024-07-20 (×8): 2.5 mg via ORAL
  Filled 2024-07-15 (×11): qty 1

## 2024-07-15 MED ORDER — INSULIN ASPART 100 UNIT/ML IJ SOLN
0.0000 [IU] | Freq: Three times a day (TID) | INTRAMUSCULAR | Status: DC
  Administered 2024-07-15 – 2024-07-18 (×6): 1 [IU] via SUBCUTANEOUS
  Administered 2024-07-19: 2 [IU] via SUBCUTANEOUS
  Administered 2024-07-19: 1 [IU] via SUBCUTANEOUS
  Administered 2024-07-20 (×2): 2 [IU] via SUBCUTANEOUS
  Filled 2024-07-15 (×2): qty 2
  Filled 2024-07-15: qty 1
  Filled 2024-07-15: qty 2
  Filled 2024-07-15: qty 1
  Filled 2024-07-15: qty 2
  Filled 2024-07-15: qty 1
  Filled 2024-07-15: qty 2
  Filled 2024-07-15: qty 1

## 2024-07-15 MED ORDER — SODIUM CHLORIDE 0.9 % IV SOLN
12.5000 mg | Freq: Once | INTRAVENOUS | Status: AC
  Administered 2024-07-15: 12.5 mg via INTRAVENOUS
  Filled 2024-07-15: qty 0.5

## 2024-07-15 MED ORDER — OXYCODONE-ACETAMINOPHEN 5-325 MG PO TABS
1.0000 | ORAL_TABLET | ORAL | Status: DC | PRN
  Administered 2024-07-15 – 2024-07-20 (×6): 1 via ORAL
  Filled 2024-07-15 (×6): qty 1

## 2024-07-15 MED ORDER — SCOPOLAMINE 1 MG/3DAYS TD PT72
1.0000 | MEDICATED_PATCH | TRANSDERMAL | Status: AC
  Administered 2024-07-15: 1 mg via TRANSDERMAL
  Filled 2024-07-15: qty 1

## 2024-07-15 MED ORDER — ATORVASTATIN CALCIUM 40 MG PO TABS
40.0000 mg | ORAL_TABLET | Freq: Every day | ORAL | Status: DC
  Administered 2024-07-15 – 2024-07-20 (×6): 40 mg via ORAL
  Filled 2024-07-15 (×6): qty 1

## 2024-07-15 MED ORDER — CHLORHEXIDINE GLUCONATE CLOTH 2 % EX PADS
6.0000 | MEDICATED_PAD | Freq: Every day | CUTANEOUS | Status: DC
  Administered 2024-07-16 – 2024-07-18 (×3): 6 via TOPICAL

## 2024-07-15 MED ORDER — ALBUTEROL SULFATE (2.5 MG/3ML) 0.083% IN NEBU: 2.5000 mg | INHALATION_SOLUTION | Freq: Four times a day (QID) | RESPIRATORY_TRACT | Status: DC | PRN

## 2024-07-15 MED ORDER — HYDROMORPHONE HCL 1 MG/ML IJ SOLN
1.0000 mg | INTRAMUSCULAR | Status: DC | PRN
  Administered 2024-07-15 – 2024-07-20 (×7): 1 mg via INTRAVENOUS
  Filled 2024-07-15 (×7): qty 1

## 2024-07-15 MED ORDER — ACETAMINOPHEN 325 MG PO TABS
650.0000 mg | ORAL_TABLET | Freq: Four times a day (QID) | ORAL | Status: DC | PRN
  Administered 2024-07-16 – 2024-07-18 (×3): 650 mg via ORAL
  Filled 2024-07-15 (×3): qty 2

## 2024-07-15 MED ORDER — LABETALOL HCL 200 MG PO TABS
200.0000 mg | ORAL_TABLET | Freq: Two times a day (BID) | ORAL | Status: DC
  Administered 2024-07-15 – 2024-07-20 (×9): 200 mg via ORAL
  Filled 2024-07-15 (×12): qty 1

## 2024-07-15 MED ORDER — DIPHENHYDRAMINE HCL 25 MG PO CAPS
25.0000 mg | ORAL_CAPSULE | Freq: Every evening | ORAL | Status: DC | PRN
  Administered 2024-07-17: 25 mg via ORAL
  Filled 2024-07-15: qty 1

## 2024-07-15 MED ORDER — TRIMETHOBENZAMIDE HCL 100 MG/ML IM SOLN
200.0000 mg | Freq: Four times a day (QID) | INTRAMUSCULAR | Status: DC | PRN
  Administered 2024-07-15 – 2024-07-16 (×3): 200 mg via INTRAMUSCULAR
  Filled 2024-07-15 (×4): qty 2

## 2024-07-15 NOTE — ED Notes (Signed)
 CCMD Called

## 2024-07-15 NOTE — Consult Note (Addendum)
 Renal Service Consult Note [facility] Lamar JONETTA Fret, MD  Patient: [name] Date: 07/15/2024 Requesting Physician: Dr. FABIENE Sharps  Reason for Consult: ESRD pt w/ refractory nausea and vomiting HPI: The patient is a 39 y.o. year-old w/ PMH as below who presented to ED yesterday 1/03 complaining of nausea, vomiting, abdominal discomfort, malaise and fatigue all over.  He had normal dialysis that day.  Workup showed x-ray which was clear without pneumonia, his COVID/flu/RSV was negative.  Patient was discharged home with diagnosis of flulike symptoms.  Patient returned earlier this morning via EMS due to recurrent nausea, vomiting and full body chills.  Patient had dialysis on Saturday and they took off 3 L.  Symptoms started shortly after that.  EMS blood pressure 160/110, HR 102, 98% O2 on room air.  In ED blood pressure 150/105, HR 98, RR 9-20, temp 98.  100% sats on room air.  Labs this morning showed K+ 3.7, BUN 66, creatinine 14.8.  Hgb 10.9, WBC 12K.  Urinalysis showed 20-50 RBCs and epis, 0-5 WBC.  Blood cultures were sent.  CT angio for dissection was negative.  Chest x-ray was negative for infiltrates or edema, tunnel catheter was in the right position.  Patient was given Tylenol , antiemetics, IV fentanyl  for pain, and Protonix  40 mg IV. Pt was admitted. We are asked to see for dialysis.    Pt seen in hospital room.  Has been on dialysis 4 years.  He is blind.  He has a right chest tunneled catheter currently.  He had an access in his left arm which clotted in December.  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  This was done on December 19.   ROS - denies CP, no joint pain, no HA, no blurry vision, no rash, no diarrhea, no nausea/ vomiting   Past Medical History  Past Medical History:  Diagnosis Date   ADHD (attention  deficit hyperactivity disorder)    Diabetes mellitus    ESRD on hemodialysis (HCC)    [facility]   High cholesterol    Hypertension    Hypertrophic cardiomyopathy (HCC) 02/26/2021   Seizure (HCC) 02/08/2021   Past Surgical History  Past Surgical History:  Procedure Laterality Date   A/V SHUNT INTERVENTION N/A 12/21/2023   Procedure: A/V SHUNT INTERVENTION;  Surgeon: Pearline Norman RAMAN, MD;  Location: [facility];  Service: Cardiovascular;  Laterality: N/A;   AMPUTATION TOE Right 12/29/2021   Procedure: AMPUTATION TOE, fifth;  Surgeon: Joya Stabs, DPM;  Location: [facility] OR;  Service: Podiatry;  Laterality: Right;  surgical team will do local block   AV FISTULA PLACEMENT Left 08/11/2020   Procedure: LEFT UPPER EXTREMITY ARTERIOVENOUS (AV) FISTULA CREATION;  Surgeon: Magda Debby SAILOR, MD;  Location: [facility] OR;  Service: Vascular;  Laterality: Left;   AV FISTULA PLACEMENT Left 06/22/2024   Procedure: brachiocephalic FISTULA CREATION;  Surgeon: Sheree Penne Bruckner, MD;  Location: [facility] OR;  Service: Vascular;  Laterality: Left;   BIOPSY  08/04/2023   Procedure: BIOPSY;  Surgeon: Cinderella Deatrice FALCON, MD;  Location: [facility];  Service: Endoscopy;;   DIALYSIS/PERMA CATHETER INSERTION Right 04/25/2024   Procedure: DIALYSIS/PERMA CATHETER INSERTION;  Surgeon: Pearline Norman RAMAN, MD;  Location: [facility];  Service: Cardiovascular;  Laterality: Right;   ESOPHAGOGASTRODUODENOSCOPY (EGD) WITH  PROPOFOL  N/A 08/04/2023   Procedure: ESOPHAGOGASTRODUODENOSCOPY (EGD) WITH PROPOFOL ;  Surgeon: Cinderella Deatrice FALCON, MD;  Location: [facility];  Service: Endoscopy;  Laterality: N/A;   FISTULA SUPERFICIALIZATION Left 12/02/2021   Procedure: PLICATION OF LEFT RADIUS CEPHALIC FISTULA;  Surgeon: Magda Debby SAILOR, MD;  Location: [facility] OR;  Service: Vascular;  Laterality: Left;  PERIPHERAL NERVE BLOCK   FRACTURE SURGERY     I & D EXTREMITY Left 07/16/2014   Procedure: IRRIGATION AND DEBRIDEMENT EXTREMITY/LEFT INDEX  FINGER;  Surgeon: Franky Curia, MD;  Location: [facility] OR;  Service: Orthopedics;  Laterality: Left;   IR FLUORO GUIDE CV LINE RIGHT  10/04/2022   IR FLUORO GUIDE CV LINE RIGHT  10/05/2022   IR PERC TUN PERIT CATH WO PORT S&I /IMAG  08/07/2020   IR REMOVAL TUN CV CATH W/O FL  10/27/2022   IR THROMBECTOMY AV FISTULA W/THROMBOLYSIS/PTA INC/SHUNT/IMG LEFT Left 10/05/2022   IR US  GUIDE VASC ACCESS RIGHT  08/07/2020   IR US  GUIDE VASC ACCESS RIGHT  10/04/2022   IR US  GUIDE VASC ACCESS RIGHT  10/05/2022   REVISON OF ARTERIOVENOUS FISTULA Left 06/22/2024   Procedure: Ligation and Excision of cephalic Fistula;  Surgeon: Sheree Penne Bruckner, MD;  Location: [facility] OR;  Service: Vascular;  Laterality: Left;   TEE WITHOUT CARDIOVERSION N/A 10/28/2022   Procedure: TRANSESOPHAGEAL ECHOCARDIOGRAM;  Surgeon: Sheena Pugh, DO;  Location: [facility];  Service: Cardiovascular;  Laterality: N/A;   VENOUS ANGIOPLASTY Left 12/21/2023   Procedure: VENOUS ANGIOPLASTY;  Surgeon: Pearline Norman RAMAN, MD;  Location: [facility];  Service: Cardiovascular;  Laterality: Left;   Family History  Family History  Problem Relation Age of Onset   Cancer Mother    Stroke Father    Diabetes Father    Hypertension Father    Social History  reports that he has never smoked. He has never been exposed to tobacco smoke. He has never used smokeless tobacco. He reports that he does not currently use alcohol. He reports that he does not use drugs. Allergies Allergies[1] Home medications Prior to Admission medications  Medication Sig Start Date End Date Taking? Authorizing Provider  atorvastatin  (LIPITOR) 40 MG tablet Take 1 tablet (40 mg total) by mouth daily. Patient taking differently: Take 40 mg by mouth daily after supper. 01/18/23 07/15/24 Yes Melvenia Motto, MD  cloNIDine  (CATAPRES ) 0.2 MG tablet Take 0.2 mg by mouth daily. 06/25/24  Yes [provider]  diphenhydramine -acetaminophen  (TYLENOL  PM) 25-500 MG TABS tablet Take 2  tablets by mouth 2 (two) times daily as needed (pain, sleep).   Yes [provider]  insulin  glargine-yfgn (SEMGLEE ) 100 UNIT/ML injection Inject 0.12 mLs (12 Units total) into the skin at bedtime. Patient taking differently: Inject 6-7 Units into the skin at bedtime. 11/07/23  Yes Massey, Amy J, NP  insulin  lispro (HUMALOG ) 100 UNIT/ML KwikPen INJECT PER SLIDING SCALE 3 TIMES DAILY WITH MEALS IF GLUCOSE IS 151-200=1 UNIT,201-250=2U, 251-300=3U,301-350=5U, 351-400=7U. CALL MD IF >400 06/18/24  Yes Massey, Amy J, NP  labetalol  (NORMODYNE ) 200 MG tablet Take 1 tablet (200 mg total) by mouth 2 (two) times daily. Patient taking differently: Take 200 mg by mouth every evening. 06/18/24 09/16/24 Yes Massey, Amy J, NP  LOKELMA  5 g packet Take 5 g by mouth See admin instructions. Take 5 g by mouth every day not going to dialysis 05/29/24  Yes [provider]  metoCLOPramide  (REGLAN ) 10 MG tablet Take 10 mg by mouth every 6 (six) hours as needed for  vomiting or nausea. 06/25/24  Yes [provider]  minoxidil  (LONITEN ) 2.5 MG tablet Take 2.5 mg by mouth 2 (two) times daily. 10/24/23  Yes [provider]  Multiple Vitamins-Minerals (MULTIVITAMIN WITH MINERALS) tablet Take 1 tablet by mouth every other day.   Yes [provider]  ondansetron  (ZOFRAN -ODT) 4 MG disintegrating tablet Take 1 tablet (4 mg total) by mouth every 8 (eight) hours as needed for nausea or vomiting. 07/14/24  Yes Tegeler, Lonni PARAS, MD  oxyCODONE -acetaminophen  (PERCOCET) 5-325 MG tablet Take 1 tablet by mouth every 4 (four) hours as needed for severe pain (pain score 7-10). 06/22/24 06/22/25 Yes Baglia, Corrina, PA-C  pantoprazole  (PROTONIX ) 40 MG tablet TAKE 1 TABLET (40 MG TOTAL) BY MOUTH 2 (TWO) TIMES DAILY. 11/03/23 11/02/24 Yes Shirlean Therisa ORN, NP  sucralfate  (CARAFATE ) 1 GM/10ML suspension TAKE 10 MLS (1 G TOTAL) BY MOUTH 4 (FOUR) TIMES DAILY. Patient not taking: Reported on 07/15/2024 02/13/24   Shirlean Therisa ORN, NP  SURE COMFORT PEN NEEDLES 31G X 8 MM MISC USE AS DIRECTED WITH INSULIN  PENS FOR INJECTION 02/13/24   Jaycee Greig PARAS, NP     Vitals:   07/15/24 1345 07/15/24 1415 07/15/24 1447 07/15/24 1939  BP: 134/86 (!) 130/93 120/79 129/86  Pulse: (!) 103 (!) 104 (!) 109 96  Resp:  14 16 18   Temp:  98.2 F (36.8 C) 98.8 F (37.1 C) 98.2 F (36.8 C)  TempSrc:  Oral Oral Oral  SpO2: 100% 99% 97% 93%  Weight:      Height:       Exam Gen alert, no distress Sclera anicteric, throat clear  No jvd or bruits Chest clear bilat to bases RRR no MRG Abd soft ntnd no mass or ascites +bs Ext no LE edema, no other edema Neuro is alert, Ox 3 , nf    RIJ TDC intact     LUA access +bruit (not ready to use)    L forearm staples in place after aneurysm clean-up  Home bp meds: Clonidine  Labetalol   Minoxidil     OP HD: TTS [facility] may 2025 -> 4h  B450   88.5kg  LUE AVF  Hep 500 bolus + 1000/hr     Assessment/ Plan:  # ESRD - on HD TTS - only had 2 sessions (Tues/ Fri) last week - plan HD tomorrow 1st shift    # HTN - bp's stable here at 130/80 range - home clonidine / labetalol  are ordered for here  # Chills/ possible gram + bacteremia - blood cx's appear to be + for GPC's, staph species - will consult pharmacy to start IV vancomycin    # Volume - not sure has much volume on  - hx N/V and exam shows no edema - UF goal will be 2 L as tolerated  # 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 here, follow   # DM on insulin           Rob Ingvald Theisen  MD [facility] 07/15/2024, 8:02 PM  Recent Labs  Lab 07/14/24 0530 07/15/24 0218 07/15/24 0910  HGB 11.3* 10.9* 9.5*  ALBUMIN 4.4 4.3  --   CALCIUM  7.8* 7.9*  --   CREATININE 12.10* 14.80*  --   K 4.1 3.7  --    Inpatient medications:  atorvastatin   40 mg Oral QPC supper   cloNIDine   0.1 mg Oral BID   insulin  aspart  0-6 Units Subcutaneous TID WC   insulin  glargine-yfgn  7 Units Subcutaneous QHS   labetalol   200 mg Oral BID   minoxidil    2.5 mg Oral BID   pantoprazole  (PROTONIX ) IV  40 mg Intravenous Q12H   scopolamine   1 patch Transdermal Q72H   sucralfate   1 g Oral QID     ceFAZolin  (ANCEF ) IV     acetaminophen  **OR** acetaminophen , albuterol , diphenhydrAMINE , diphenhydrAMINE , HYDROmorphone  (DILAUDID ) injection, oxyCODONE -acetaminophen , trimethobenzamide       [1]  Allergies Allergen Reactions   Apresoline  [Hydralazine ] Nausea And Vomiting and Other (See Comments)    Patient was taking Hydralazine  AND Amlodipine  at the same time, so the reactions came from one of the 2: Lethargy and an all-over feeling of NOT feeling well   Norvasc  [Amlodipine ] Nausea And Vomiting and Other (See Comments)    Patient was taking Amlodipine  AND Hydralazine  at the same time, so the reactions came from one of the 2: Lethargy and an all-over feeling of NOT feeling well

## 2024-07-15 NOTE — ED Triage Notes (Addendum)
 Pt BIB [facility] from home c/o Nausea, vomiting, and full body chills. Pt underwent dialysis yesterday, dialysis took 3L off and the symptoms started shortly after. Pt was seen here yesterday, but unable to fill prescriptions and symptoms returned. Pt is visually impaired.  EMS Vitals: 162/110 102 HR 98% O2 191 CBG

## 2024-07-15 NOTE — ED Notes (Signed)
 Pt slipped out of bed onto his bottom, when trying to get situated on bed. MD made aware.

## 2024-07-15 NOTE — ED Provider Notes (Signed)
 " [facility] Provider Note   CSN: [id number] Arrival date & time: 07/15/24  9792     Patient presents with: Abdominal Pain   [name] is a 39 y.o. male.   The history is provided by the patient and medical records.  Abdominal Pain [name] is a 39 y.o. male who presents to the Emergency Department complaining of abdominal pain.  He presents to the emergency department for evaluation of sharp and stabbing abdominal pain that radiates to his chest.  He has associated numerous episodes of emesis.  Symptoms started on Friday while he was receiving dialysis.  He states that he had chills and felt unwell during the session.  He is unsure if fevers.  Symptoms have worsened since that time.  No diarrhea.  He is passing gas.  He does not make urine.  He does report similar symptoms in the past secondary to a staph infection.     Prior to Admission medications  Medication Sig Start Date End Date Taking? Authorizing Provider  atorvastatin  (LIPITOR) 40 MG tablet Take 1 tablet (40 mg total) by mouth daily. Patient taking differently: Take 40 mg by mouth daily after supper. 01/18/23 06/22/24  Melvenia Motto, MD  cloNIDine  (CATAPRES ) 0.1 MG tablet Take 1 tablet (0.1 mg total) by mouth 2 (two) times daily. 06/18/24   Jaycee Greig PARAS, NP  diphenhydramine -acetaminophen  (TYLENOL  PM) 25-500 MG TABS tablet Take 2 tablets by mouth 2 (two) times daily as needed (pain, sleep).    [provider]  insulin  glargine-yfgn (SEMGLEE ) 100 UNIT/ML injection Inject 0.12 mLs (12 Units total) into the skin at bedtime. Patient taking differently: Inject 7 Units into the skin at bedtime. 11/07/23   Jaycee Greig PARAS, NP  insulin  lispro (HUMALOG ) 100 UNIT/ML KwikPen INJECT PER SLIDING SCALE 3 TIMES DAILY WITH MEALS IF GLUCOSE IS 151-200=1 UNIT,201-250=2U, 251-300=3U,301-350=5U, 351-400=7U. CALL MD IF >400 06/18/24   Jaycee Greig PARAS, NP  labetalol  (NORMODYNE ) 200 MG tablet  Take 1 tablet (200 mg total) by mouth 2 (two) times daily. 06/18/24 09/16/24  Jaycee Greig PARAS, NP  minoxidil  (LONITEN ) 2.5 MG tablet Take 2.5 mg by mouth 2 (two) times daily. 10/24/23   [provider]  Multiple Vitamins-Minerals (MULTIVITAMIN WITH MINERALS) tablet Take 1 tablet by mouth daily.    [provider]  ondansetron  (ZOFRAN -ODT) 4 MG disintegrating tablet Take 1 tablet (4 mg total) by mouth every 8 (eight) hours as needed for nausea or vomiting. 07/14/24   Tegeler, Lonni PARAS, MD  oxyCODONE -acetaminophen  (PERCOCET) 5-325 MG tablet Take 1 tablet by mouth every 4 (four) hours as needed for severe pain (pain score 7-10). 06/22/24 06/22/25  Baglia, Corrina, PA-C  pantoprazole  (PROTONIX ) 40 MG tablet TAKE 1 TABLET (40 MG TOTAL) BY MOUTH 2 (TWO) TIMES DAILY. 11/03/23 11/02/24  Shirlean Therisa ORN, NP  sodium zirconium cyclosilicate  (LOKELMA ) 10 g PACK packet Take 10 g by mouth See admin instructions. Take 1 packet (10g) once daily on non-dialysis days (Sunday, Monday, Wednesday, Friday)    [provider]  sucralfate  (CARAFATE ) 1 GM/10ML suspension TAKE 10 MLS (1 G TOTAL) BY MOUTH 4 (FOUR) TIMES DAILY. 02/13/24   Shirlean Therisa ORN, NP  SURE COMFORT PEN NEEDLES 31G X 8 MM MISC USE AS DIRECTED WITH INSULIN  PENS FOR INJECTION 02/13/24   Jaycee Greig PARAS, NP    Allergies: Apresoline  [hydralazine ] and Norvasc  [amlodipine ]    Review of Systems  Gastrointestinal:  Positive for abdominal pain.  All  other systems reviewed and are negative.   Updated Vital Signs BP (!) 150/103   Pulse 95   Temp 98 F (36.7 C) (Oral)   Resp 18   Ht 6' (1.829 m)   Wt 90.7 kg   SpO2 97%   BMI 27.12 kg/m   Physical Exam Vitals and nursing note reviewed.  Constitutional:      Appearance: He is well-developed.  HENT:     Head: Normocephalic and atraumatic.  Cardiovascular:     Rate and Rhythm: Normal rate and regular rhythm.  Pulmonary:     Effort: Pulmonary effort is normal. No respiratory distress.   Abdominal:     Palpations: Abdomen is soft.     Tenderness: There is abdominal tenderness. There is no guarding or rebound.     Comments: Generalized upper abdominal tenderness  Musculoskeletal:        General: No tenderness.  Skin:    General: Skin is warm and dry.  Neurological:     Mental Status: He is alert and oriented to person, place, and time.  Psychiatric:        Behavior: Behavior normal.     (all labs ordered are listed, but only abnormal results are displayed) Labs Reviewed  COMPREHENSIVE METABOLIC PANEL WITH GFR - Abnormal; Notable for the following components:      Result Value   Chloride 90 (*)    Glucose, Bld 172 (*)    BUN 66 (*)    Creatinine, Ser 14.80 (*)    Calcium  7.9 (*)    Total Protein 8.2 (*)    GFR, Estimated 4 (*)    Anion gap 23 (*)    All other components within normal limits  CBC - Abnormal; Notable for the following components:   WBC 12.4 (*)    RBC 3.98 (*)    Hemoglobin 10.9 (*)    HCT 33.1 (*)    RDW 16.3 (*)    All other components within normal limits  CULTURE, BLOOD (ROUTINE X 2)  CULTURE, BLOOD (ROUTINE X 2)  LIPASE, BLOOD  URINALYSIS, ROUTINE W REFLEX MICROSCOPIC    EKG: EKG Interpretation Date/Time:  Sunday July 15 2024 02:24:08 EST Ventricular Rate:  107 PR Interval:  174 QRS Duration:  86 QT Interval:  384 QTC Calculation: 512 R Axis:   0  Text Interpretation: Sinus tachycardia Anterior infarct , age undetermined Abnormal ECG Confirmed by Griselda Norris [phone]) on 07/15/2024 3:13:56 AM  Radiology: CT Angio Chest/Abd/Pel for Dissection W and/or W/WO Result Date: 07/15/2024 EXAM: CTA CHEST, ABDOMEN AND PELVIS WITHOUT AND WITH CONTRAST 07/15/2024 04:21:17 AM TECHNIQUE: CTA of the chest was performed without and with the administration of 75 mL iohexol  (OMNIPAQUE ) 350 MG/ML intravenous contrast. CTA of the abdomen and pelvis was performed without and with the administration of 75 mL iohexol  (OMNIPAQUE ) 350 MG/ML  intravenous contrast. Multiplanar reformatted images are provided for review. MIP images are provided for review. Automated exposure control, iterative reconstruction, and/or weight based adjustment of the mA/kV was utilized to reduce the radiation dose to as low as reasonably achievable. COMPARISON: CT of the abdomen and pelvis dated 11/09/2023. CLINICAL HISTORY: Acute aortic syndrome (AAS) suspected. FINDINGS: VASCULATURE: AORTA: No acute finding. No abdominal aortic aneurysm. No dissection. PULMONARY ARTERIES: No pulmonary embolism with the limits of this exam. GREAT VESSELS OF AORTIC ARCH: No acute finding. No dissection. No arterial occlusion or significant stenosis. CELIAC TRUNK: No acute finding. No occlusion or significant stenosis. SUPERIOR MESENTERIC ARTERY: Mild-to-moderate calcific atheromatous  disease. No significant stenosis demonstrated. INFERIOR MESENTERIC ARTERY: No acute finding. No occlusion or significant stenosis. RENAL ARTERIES: There are 2 right renal arteries. There is a solitary left renal artery. No occlusion or significant stenosis. ILIAC ARTERIES: No acute finding. No occlusion or significant stenosis. CHEST: MEDIASTINUM: No mediastinal lymphadenopathy. The heart and pericardium demonstrate no acute abnormality. LUNGS AND PLEURA: Mild atelectasis present dependently within the lower lobes bilaterally. No focal consolidation or pulmonary edema. No evidence of pleural effusion or pneumothorax. THORACIC BONES AND SOFT TISSUES: A right internal jugular central venous line is present. No acute bone abnormality. ABDOMEN AND PELVIS: LIVER: The liver is unremarkable. GALLBLADDER AND BILE DUCTS: Gallbladder is unremarkable. No biliary ductal dilatation. SPLEEN: The spleen is unremarkable. PANCREAS: The pancreas is unremarkable. ADRENAL GLANDS: Bilateral adrenal glands demonstrate no acute abnormality. 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 No stones in the  kidneys or ureters. No hydronephrosis. No perinephric or periureteral stranding. Urinary bladder is unremarkable. GI AND BOWEL: Stomach and duodenal sweep demonstrate no acute abnormality. There is no bowel obstruction. No abnormal bowel wall thickening or distension. REPRODUCTIVE: Reproductive organs are unremarkable. PERITONEUM AND RETROPERITONEUM: No ascites or free air. LYMPH NODES: No lymphadenopathy. ABDOMINAL BONES AND SOFT TISSUES: No acute abnormality of the bones. Bilateral gynecomastia. No acute soft tissue abnormality. IMPRESSION: 1. No acute aortic syndrome. 2. Mild dependent atelectasis in the bilateral lower lobes. 3. 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 4. Right internal jugular central venous line in place. 5. Horseshoe kidney. Electronically signed by: Evalene Coho MD 07/15/2024 04:53 AM EST RP Workstation: [id number]   DG Chest Portable 1 View Result Date: 07/14/2024 EXAM: 1 VIEW(S) XRAY OF THE CHEST 07/14/2024 06:50:00 AM COMPARISON: 08/02/2023 CLINICAL HISTORY: flu-like illness FINDINGS: LINES, TUBES AND DEVICES: Right dialysis catheter in place with tip at atrial caval junction. LUNGS AND PLEURA: No focal pulmonary opacity. No pleural effusion. No pneumothorax. HEART AND MEDIASTINUM: No acute abnormality of the cardiac and mediastinal silhouettes. BONES AND SOFT TISSUES: No acute osseous abnormality. IMPRESSION: 1. No acute cardiopulmonary findings. 2. Right dialysis catheter in place with tip at the cavoatrial junction. Electronically signed by: Evalene Coho MD 07/14/2024 07:10 AM EST RP Workstation: [id number]     Procedures   Medications Ordered in the ED  ondansetron  (ZOFRAN -ODT) disintegrating tablet 4 mg (4 mg Oral Given 07/15/24 0239)  fentaNYL  (SUBLIMAZE ) injection 50 mcg (50 mcg Intravenous Given 07/15/24 0348)  ondansetron  (ZOFRAN ) injection 4 mg (4 mg Intravenous Given 07/15/24 0351)   iohexol  (OMNIPAQUE ) 350 MG/ML injection 75 mL (75 mLs Intravenous Contrast Given 07/15/24 0422)  pantoprazole  (PROTONIX ) injection 40 mg (40 mg Intravenous Given 07/15/24 0500)  promethazine  (PHENERGAN ) 12.5 mg in sodium chloride  0.9 % 50 mL IVPB (0 mg Intravenous Stopped 07/15/24 0659)                                    Medical Decision Making Amount and/or Complexity of Data Reviewed Labs: ordered. Radiology: ordered.  Risk Prescription drug management.   Patient with history of ESRD on hemodialysis, diabetes, hypertension here for eval ration of abdominal pain, numerous episodes of emesis.  Patient with recurrent emesis in the emergency department despite multiple medications.  He was treated with Protonix , ondansetron , promethazine .  Labs with stable renal function.  CTA is negative for acute abnormality.  Given patient's persistent vomiting plan to admit for ongoing  management.     Final diagnoses:  Intractable vomiting  ESRD (end stage renal disease) on dialysis [facility])    ED Discharge Orders     None          Griselda Norris, MD 07/15/24 [phone]  "

## 2024-07-15 NOTE — Care Management (Signed)
 Called patient  for code 64 notice. He asked to call back later, his SO is at work and he does not think his brother would take it,  Will call back as time allows

## 2024-07-15 NOTE — Progress Notes (Signed)
 PHARMACY - PHYSICIAN COMMUNICATION CRITICAL VALUE ALERT - BLOOD CULTURE IDENTIFICATION (BCID)  [name] is an 39 y.o. male who presented to [facility] on 07/15/2024 with a chief complaint of vomiting and abdominal pain.  Assessment:  2 of 4 bottles (both bottles of 1 set) growing MSSA  Name of physician (or Provider) Contacted: Dr. Claudene  Current antibiotics: none  Changes to prescribed antibiotics recommended:  Recommendations accepted by provider - start cefazolin  1g q24h (HD dosing)  Results for orders placed or performed during the hospital encounter of 07/15/24  Blood Culture ID Panel (Reflexed) (Collected: 07/15/2024  3:29 AM)  Result Value Ref Range   Enterococcus faecalis NOT DETECTED NOT DETECTED   Enterococcus Faecium NOT DETECTED NOT DETECTED   Listeria monocytogenes NOT DETECTED NOT DETECTED   Staphylococcus species DETECTED (A) NOT DETECTED   Staphylococcus aureus (BCID) DETECTED (A) NOT DETECTED   Staphylococcus epidermidis NOT DETECTED NOT DETECTED   Staphylococcus lugdunensis NOT DETECTED NOT DETECTED   Streptococcus species NOT DETECTED NOT DETECTED   Streptococcus agalactiae NOT DETECTED NOT DETECTED   Streptococcus pneumoniae NOT DETECTED NOT DETECTED   Streptococcus pyogenes NOT DETECTED NOT DETECTED   A.calcoaceticus-baumannii NOT DETECTED NOT DETECTED   Bacteroides fragilis NOT DETECTED NOT DETECTED   Enterobacterales NOT DETECTED NOT DETECTED   Enterobacter cloacae complex NOT DETECTED NOT DETECTED   Escherichia coli NOT DETECTED NOT DETECTED   Klebsiella aerogenes NOT DETECTED NOT DETECTED   Klebsiella oxytoca NOT DETECTED NOT DETECTED   Klebsiella pneumoniae NOT DETECTED NOT DETECTED   Proteus species NOT DETECTED NOT DETECTED   Salmonella species NOT DETECTED NOT DETECTED   Serratia marcescens NOT DETECTED NOT DETECTED   Haemophilus influenzae NOT DETECTED NOT DETECTED   Neisseria meningitidis NOT DETECTED NOT DETECTED   Pseudomonas aeruginosa  NOT DETECTED NOT DETECTED   Stenotrophomonas maltophilia NOT DETECTED NOT DETECTED   Candida albicans NOT DETECTED NOT DETECTED   Candida auris NOT DETECTED NOT DETECTED   Candida glabrata NOT DETECTED NOT DETECTED   Candida krusei NOT DETECTED NOT DETECTED   Candida parapsilosis NOT DETECTED NOT DETECTED   Candida tropicalis NOT DETECTED NOT DETECTED   Cryptococcus neoformans/gattii NOT DETECTED NOT DETECTED   Meth resistant mecA/C and MREJ NOT DETECTED NOT DETECTED    [name] 07/15/2024  6:08 PM

## 2024-07-15 NOTE — H&P (Addendum)
 " History and Physical    Patient: [name] FMW:[id number] DOB: 08-Mar-1986 DOA: 07/15/2024 DOS: the patient was seen and examined on 07/15/2024 PCP: Jaycee Greig PARAS, NP  Patient coming from: Home  Chief Complaint:  Chief Complaint  Patient presents with   Abdominal Pain   HPI: [name] is a 39 y.o. male with medical history significant of hypertension, hyperlipidemia, diastolic CHF, diabetes mellitus type 2,  ESRD on hemodialysis (TTS), and GERD with prior presents with persistent vomiting and abdominal pain.  He has been experiencing persistent vomiting since Friday afternoon, which led him to visit the emergency room on Saturday. He received Zofran  and Reglan , which provided some relief which he was able to be discharged home, but the vomiting continued by Saturday afternoon. The vomiting is described as severe and is accompanied by significant abdominal pain and a headache.  There is no history of fever or chills at home, although he currently experiences chills. He has not been around anyone recently who has been sick. No diarrhea is present, and he has not eaten anything recently, expressing a desire for Jell-O and water  due to not having eaten.  He reported having an issue with vomiting blood 07/2023. He is also on dialysis and has a new fistula in his left forearm after the previous one clotted.  He last dialyzed on Friday given holiday schedule and is not scheduled to have dialysis tomorrow.  In the emergency department the patient was noted to have temperature of 99.1 F with tachycardia and tachypnea meeting SIRS criteria.  Systolic blood pressures elevated up to 169 with diastolics elevated up to 106, and O2 saturations currently maintained on room air.  Chest x-ray noted no acute pulmonary abnormality.    CT angiogram of the chest abdomen pelvis showed no acute aortic syndrome with mild dependent atelectasis in the bilateral lower lobes and mild to moderate atheromatous disease  involving the superior mesenteric artery without significant stenosis.  Patient had been given antiemetics, fentanyl  IV for pain, and Protonix  40 mg IV   Review of Systems: As mentioned in the history of present illness. All other systems reviewed and are negative. Past Medical History:  Diagnosis Date   ADHD (attention deficit hyperactivity disorder)    Diabetes mellitus    ESRD on hemodialysis (HCC)    [facility]   High cholesterol    Hypertension    Hypertrophic cardiomyopathy (HCC) 02/26/2021   Seizure (HCC) 02/08/2021   Past Surgical History:  Procedure Laterality Date   A/V SHUNT INTERVENTION N/A 12/21/2023   Procedure: A/V SHUNT INTERVENTION;  Surgeon: Pearline Norman RAMAN, MD;  Location: [facility];  Service: Cardiovascular;  Laterality: N/A;   AMPUTATION TOE Right 12/29/2021   Procedure: AMPUTATION TOE, fifth;  Surgeon: Joya Stabs, DPM;  Location: [facility] OR;  Service: Podiatry;  Laterality: Right;  surgical team will do local block   AV FISTULA PLACEMENT Left 08/11/2020   Procedure: LEFT UPPER EXTREMITY ARTERIOVENOUS (AV) FISTULA CREATION;  Surgeon: Magda Debby SAILOR, MD;  Location: [facility] OR;  Service: Vascular;  Laterality: Left;   AV FISTULA PLACEMENT Left 06/22/2024   Procedure: brachiocephalic FISTULA CREATION;  Surgeon: Sheree Penne Bruckner, MD;  Location: [facility] OR;  Service: Vascular;  Laterality: Left;   BIOPSY  08/04/2023   Procedure: BIOPSY;  Surgeon: Cinderella Deatrice FALCON, MD;  [facility]: [facility];  Service: Endoscopy;;   DIALYSIS/PERMA CATHETER INSERTION Right 04/25/2024   Procedure: DIALYSIS/PERMA CATHETER INSERTION;  Surgeon: Pearline Norman RAMAN, MD;  Location: [facility];  Service: Cardiovascular;  Laterality: Right;   ESOPHAGOGASTRODUODENOSCOPY (EGD) WITH PROPOFOL  N/A 08/04/2023   Procedure: ESOPHAGOGASTRODUODENOSCOPY (EGD) WITH PROPOFOL ;  Surgeon: Cinderella Deatrice FALCON, MD;  [facility]: [facility];  Service: Endoscopy;  Laterality: N/A;   FISTULA SUPERFICIALIZATION  Left 12/02/2021   Procedure: PLICATION OF LEFT RADIUS CEPHALIC FISTULA;  Surgeon: Magda Debby SAILOR, MD;  Location: [facility] OR;  Service: Vascular;  Laterality: Left;  PERIPHERAL NERVE BLOCK   FRACTURE SURGERY     I & D EXTREMITY Left 07/16/2014   Procedure: IRRIGATION AND DEBRIDEMENT EXTREMITY/LEFT INDEX FINGER;  Surgeon: Franky Curia, MD;  Location: [facility];  Service: Orthopedics;  Laterality: Left;   IR FLUORO GUIDE CV LINE RIGHT  10/04/2022   IR FLUORO GUIDE CV LINE RIGHT  10/05/2022   IR PERC TUN PERIT CATH WO PORT S&I /IMAG  08/07/2020   IR REMOVAL TUN CV CATH W/O FL  10/27/2022   IR THROMBECTOMY AV FISTULA W/THROMBOLYSIS/PTA INC/SHUNT/IMG LEFT Left 10/05/2022   IR US  GUIDE VASC ACCESS RIGHT  08/07/2020   IR US  GUIDE VASC ACCESS RIGHT  10/04/2022   IR US  GUIDE VASC ACCESS RIGHT  10/05/2022   REVISON OF ARTERIOVENOUS FISTULA Left 06/22/2024   Procedure: Ligation and Excision of cephalic Fistula;  Surgeon: Sheree Penne Bruckner, MD;  Location: [facility] OR;  Service: Vascular;  Laterality: Left;   TEE WITHOUT CARDIOVERSION N/A 10/28/2022   Procedure: TRANSESOPHAGEAL ECHOCARDIOGRAM;  Surgeon: Sheena Pugh, DO;  [facility];  Service: Cardiovascular;  Laterality: N/A;   VENOUS ANGIOPLASTY Left 12/21/2023   Procedure: VENOUS ANGIOPLASTY;  Surgeon: Pearline Norman RAMAN, MD;  Location: [facility];  Service: Cardiovascular;  Laterality: Left;   Social History:  reports that he has never smoked. He has never been exposed to tobacco smoke. He has never used smokeless tobacco. He reports that he does not currently use alcohol. He reports that he does not use drugs.  Allergies[1]  Family History  Problem Relation Age of Onset   Cancer Mother    Stroke Father    Diabetes Father    Hypertension Father     Prior to Admission medications  Medication Sig Start Date End Date Taking? Authorizing Provider  atorvastatin  (LIPITOR) 40 MG tablet Take 1 tablet (40 mg total) by mouth daily. Patient taking  differently: Take 40 mg by mouth daily after supper. 01/18/23 06/22/24  Melvenia Motto, MD  cloNIDine  (CATAPRES ) 0.1 MG tablet Take 1 tablet (0.1 mg total) by mouth 2 (two) times daily. 06/18/24   Jaycee Greig PARAS, NP  diphenhydramine -acetaminophen  (TYLENOL  PM) 25-500 MG TABS tablet Take 2 tablets by mouth 2 (two) times daily as needed (pain, sleep).    [provider]  insulin  glargine-yfgn (SEMGLEE ) 100 UNIT/ML injection Inject 0.12 mLs (12 Units total) into the skin at bedtime. Patient taking differently: Inject 7 Units into the skin at bedtime. 11/07/23   Jaycee Greig PARAS, NP  insulin  lispro (HUMALOG ) 100 UNIT/ML KwikPen INJECT PER SLIDING SCALE 3 TIMES DAILY WITH MEALS IF GLUCOSE IS 151-200=1 UNIT,201-250=2U, 251-300=3U,301-350=5U, 351-400=7U. CALL MD IF >400 06/18/24   Jaycee Greig PARAS, NP  labetalol  (NORMODYNE ) 200 MG tablet Take 1 tablet (200 mg total) by mouth 2 (two) times daily. 06/18/24 09/16/24  Jaycee Greig PARAS, NP  minoxidil  (LONITEN ) 2.5 MG tablet Take 2.5 mg by mouth 2 (two) times daily. 10/24/23   [provider]  Multiple Vitamins-Minerals (MULTIVITAMIN WITH MINERALS) tablet Take 1 tablet by mouth daily.    [provider]  ondansetron  (ZOFRAN -ODT) 4 MG disintegrating tablet Take 1 tablet (4 mg total) by mouth every 8 (eight) hours as needed for nausea or vomiting. 07/14/24   Tegeler, Lonni PARAS, MD  oxyCODONE -acetaminophen  (PERCOCET) 5-325 MG tablet Take 1 tablet by mouth every 4 (four) hours as needed for severe pain (pain score 7-10). 06/22/24 06/22/25  Baglia, Corrina, PA-C  pantoprazole  (PROTONIX ) 40 MG tablet TAKE 1 TABLET (40 MG TOTAL) BY MOUTH 2 (TWO) TIMES DAILY. 11/03/23 11/02/24  Shirlean Therisa ORN, NP  sodium zirconium cyclosilicate  (LOKELMA ) 10 g PACK packet Take 10 g by mouth See admin instructions. Take 1 packet (10g) once daily on non-dialysis days (Sunday, Monday, Wednesday, Friday)    [provider]  sucralfate  (CARAFATE ) 1 GM/10ML suspension TAKE 10 MLS (1 G  TOTAL) BY MOUTH 4 (FOUR) TIMES DAILY. 02/13/24   Shirlean Therisa ORN, NP  SURE COMFORT PEN NEEDLES 31G X 8 MM MISC USE AS DIRECTED WITH INSULIN  PENS FOR INJECTION 02/13/24   Jaycee Greig PARAS, NP    Physical Exam: Vitals:   07/15/24 0600 07/15/24 0615 07/15/24 0645 07/15/24 0700  BP: (!) 162/104 (!) 155/98 (!) 150/103 (!) 156/89  Pulse: 99 97 95 96  Resp: 10 14 18  (!) 22  Temp:      TempSrc:      SpO2: 98% 97% 97% 100%  Weight:      Height:       Exam  Constitutional: Middle-age male who appears to be in some distress Eyes: PERRL.  Patient reports inability to see ENMT: Mucous membranes are dry.  Fair dentition Neck: normal, supple,  Respiratory: clear to auscultation bilaterally, no wheezing, no crackles. Normal respiratory effort. No accessory muscle use.  Cardiovascular: Regular rate and rhythm, no murmurs / rubs / gallops. No extremity edema. 2+ pedal pulses.  Left forearm fistula in place with staples still present and no significant erythema. Abdomen: No guarding appreciated.  No masses palpated.  Bowel sounds positive.  Musculoskeletal: no clubbing / cyanosis. No joint deformity upper and lower extremities. Good ROM, no contractures. Normal muscle tone.  Skin: no rashes, lesions, ulcers. No induration Neurologic: CN 2-12 grossly intact.  Strength 5/5 in all 4.  Psychiatric: Normal judgment and insight. Alert and oriented x 3. Normal mood.   Data Reviewed:  EKG sinus tachycardia 107 bpm with QTc 512.  Reviewed labs, imaging, and pertinent records as documented Assessment and Plan:   Intractable nausea and vomiting Abdominal pain Patient presents with complaints of abdominal pain with nausea and vomiting.  Emesis was reported to possibly being coffee-ground in appearance.  Question some viral illness.  CT angiogram of the chest abdomen and pelvis did not show acute cause for patient's symptoms.  Question  gastritis/esophagitis vs. gastroparesis/dysmotility vs. gastroenteritis  vs. less  likely mesenteric ischemia and bowel obstruction given CTA showing no significant stenosis or sign of obstruction.  Patient had initially been given 2 doses of Zofran , Reglan , and fentanyl  IV for pain. - Admit to the telemetry bed - Aspiration precautions with elevation head of bed - Monitor intake and output - Check complete respiratory virus panel - Check gastric/stool occult - Clear liquid diet and advance to renal appropriate - Tigan  as needed for nausea and vomiting given prolonged QT - Apply scopolamine  patch x 1 - Continue fentanyl  IV as needed for pain  Leukocytosis Acute.  WBC elevated 12.4.  Reactive to above. - Continue to monitor  Hypertensive urgency Blood pressures elevated with systolic 150-169 and diastolic 89-106. -  Continue clonidine , labetalol , and minoxidil   Anemia of chronic kidney disease Hemoglobin noted to be 10.9. - Follow-up gastric/stool occult - Continue to monitor H&H  Prolonged QT interval QTc prolonged at 512. - Avoid additional QT prolonging medication - Correct or any electrolyte abnormalities  ESRD on HD Elevated anion gap Patient on a Tuesday, Thursday, Saturday schedule.  Last dialyzed 2 days ago on holiday schedule as sent to be dialyzed tomorrow.  Labs noted potassium 3.7, CO2 23, BUN 66, creatinine 14.8, and anion gap 23. - Continue fluid restriction - Nephrology consulted for need of hemodialysis  Insulin -dependent diabetes mellitus  On admission glucose noted to be 172.  Patient's last available hemoglobin A1c noted to be 6.5 when checked on 06/18/2024. - Hypoglycemic protocols - Continue Semglee  7 units nightly - CBGs before every meal with very sensitive SSI  Diastolic congestive heart failure Patient appears to be euvolemic at this time.  Last echocardiogram noted EF to be 60 to 65% with grade 3 diastolic dysfunction last checked 10/2022. - Continue fluid management with dialysis  GERD with nausea esophagitis Patient with prior  history of GI bleed 07/2023 where endoscopic revealed LA grade D esophagitis, Mallory-Weiss tear, gastric polyp, and gastritis. - Continue Protonix  40 mg IV twice daily - Resume Carafate  once able   DVT prophylaxis: SCDs Advance Care Planning:   Code Status: Full Code   Consults: Nephrology  Family Communication: None  Severity of Illness: The appropriate patient status for this patient is OBSERVATION. Observation status is judged to be reasonable and necessary in order to provide the required intensity of service to ensure the patient's safety. The patient's presenting symptoms, physical exam findings, and initial radiographic and laboratory data in the context of their medical condition is felt to place them at decreased risk for further clinical deterioration. Furthermore, it is anticipated that the patient will be medically stable for discharge from the hospital within 2 midnights of admission.   Author: Maximino DELENA Sharps, MD 07/15/2024 7:50 AM  For on call review [email or web address].      [1]  Allergies Allergen Reactions   Apresoline  [Hydralazine ] Nausea And Vomiting and Other (See Comments)    Patient was taking Hydralazine  AND Amlodipine  at the same time, so the reactions came from one of the 2: Lethargy and an all-over feeling of NOT feeling well   Norvasc  [Amlodipine ] Nausea And Vomiting and Other (See Comments)    Patient was taking Amlodipine  AND Hydralazine  at the same time, so the reactions came from one of the 2: Lethargy and an all-over feeling of NOT feeling well    "

## 2024-07-15 NOTE — ED Provider Triage Note (Addendum)
 Emergency Medicine Provider Triage Evaluation Note  [name] , a 39 y.o. male  was evaluated in triage.  Pt complains of N/V/chills. Seen yesterday for same and diagnosed with flu-like illness. Did not fill antiemetic script provided yesterday. Has d/c papers at the bedside. HD patient, last dialyzed yesterday.   Review of Systems  Positive: As above Negative: sob  Physical Exam  BP (!) 154/106   Pulse (!) 114   Temp 98.7 F (37.1 C) (Oral)   Resp 18   Ht 6' (1.829 m)   Wt 90.7 kg   SpO2 100%   BMI 27.12 kg/m  Gen:   Awake, no distress   Resp:  Normal effort  MSK:   Moves extremities without difficulty  Other:  Dry heaving intermittently, tachycardia improved when not heaving. Lungs CTAB.  HR 99 at this time on pulse ox.   Medical Decision Making  Medically screening exam initiated at 2:39 AM.  Appropriate orders placed.  [name] was informed that the remainder of the evaluation will be completed by another provider, this initial triage assessment does not replace that evaluation, and the importance of remaining in the ED until their evaluation is complete.  This chart was dictated using voice recognition software, Dragon. Despite the best efforts of this provider to proofread and correct errors, errors may still occur which can change documentation meaning.    Bobette Pleasant SAUNDERS, PA-C 07/15/24 [phone]

## 2024-07-16 DIAGNOSIS — N2581 Secondary hyperparathyroidism of renal origin: Secondary | ICD-10-CM | POA: Diagnosis present

## 2024-07-16 DIAGNOSIS — I160 Hypertensive urgency: Secondary | ICD-10-CM | POA: Diagnosis present

## 2024-07-16 DIAGNOSIS — N186 End stage renal disease: Secondary | ICD-10-CM | POA: Diagnosis present

## 2024-07-16 DIAGNOSIS — Z1152 Encounter for screening for COVID-19: Secondary | ICD-10-CM | POA: Diagnosis not present

## 2024-07-16 DIAGNOSIS — B9561 Methicillin susceptible Staphylococcus aureus infection as the cause of diseases classified elsewhere: Secondary | ICD-10-CM | POA: Diagnosis present

## 2024-07-16 DIAGNOSIS — I110 Hypertensive heart disease with heart failure: Secondary | ICD-10-CM | POA: Diagnosis not present

## 2024-07-16 DIAGNOSIS — I3489 Other nonrheumatic mitral valve disorders: Secondary | ICD-10-CM | POA: Diagnosis not present

## 2024-07-16 DIAGNOSIS — I330 Acute and subacute infective endocarditis: Secondary | ICD-10-CM | POA: Diagnosis present

## 2024-07-16 DIAGNOSIS — E1022 Type 1 diabetes mellitus with diabetic chronic kidney disease: Secondary | ICD-10-CM | POA: Diagnosis present

## 2024-07-16 DIAGNOSIS — K3184 Gastroparesis: Secondary | ICD-10-CM | POA: Diagnosis present

## 2024-07-16 DIAGNOSIS — T827XXA Infection and inflammatory reaction due to other cardiac and vascular devices, implants and grafts, initial encounter: Secondary | ICD-10-CM | POA: Diagnosis not present

## 2024-07-16 DIAGNOSIS — Z89421 Acquired absence of other right toe(s): Secondary | ICD-10-CM | POA: Diagnosis not present

## 2024-07-16 DIAGNOSIS — Z794 Long term (current) use of insulin: Secondary | ICD-10-CM | POA: Diagnosis not present

## 2024-07-16 DIAGNOSIS — I351 Nonrheumatic aortic (valve) insufficiency: Secondary | ICD-10-CM | POA: Diagnosis not present

## 2024-07-16 DIAGNOSIS — Z604 Social exclusion and rejection: Secondary | ICD-10-CM | POA: Diagnosis present

## 2024-07-16 DIAGNOSIS — D631 Anemia in chronic kidney disease: Secondary | ICD-10-CM | POA: Diagnosis present

## 2024-07-16 DIAGNOSIS — R7881 Bacteremia: Secondary | ICD-10-CM | POA: Diagnosis present

## 2024-07-16 DIAGNOSIS — Z8249 Family history of ischemic heart disease and other diseases of the circulatory system: Secondary | ICD-10-CM | POA: Diagnosis not present

## 2024-07-16 DIAGNOSIS — B9689 Other specified bacterial agents as the cause of diseases classified elsewhere: Secondary | ICD-10-CM | POA: Diagnosis not present

## 2024-07-16 DIAGNOSIS — Z79899 Other long term (current) drug therapy: Secondary | ICD-10-CM | POA: Diagnosis not present

## 2024-07-16 DIAGNOSIS — Z992 Dependence on renal dialysis: Secondary | ICD-10-CM | POA: Diagnosis not present

## 2024-07-16 DIAGNOSIS — I120 Hypertensive chronic kidney disease with stage 5 chronic kidney disease or end stage renal disease: Secondary | ICD-10-CM

## 2024-07-16 DIAGNOSIS — E1043 Type 1 diabetes mellitus with diabetic autonomic (poly)neuropathy: Secondary | ICD-10-CM | POA: Diagnosis present

## 2024-07-16 DIAGNOSIS — E7800 Pure hypercholesterolemia, unspecified: Secondary | ICD-10-CM | POA: Diagnosis present

## 2024-07-16 DIAGNOSIS — E1122 Type 2 diabetes mellitus with diabetic chronic kidney disease: Secondary | ICD-10-CM | POA: Diagnosis not present

## 2024-07-16 DIAGNOSIS — H548 Legal blindness, as defined in USA: Secondary | ICD-10-CM | POA: Diagnosis present

## 2024-07-16 DIAGNOSIS — I5032 Chronic diastolic (congestive) heart failure: Secondary | ICD-10-CM | POA: Diagnosis present

## 2024-07-16 DIAGNOSIS — Z833 Family history of diabetes mellitus: Secondary | ICD-10-CM | POA: Diagnosis not present

## 2024-07-16 DIAGNOSIS — K2100 Gastro-esophageal reflux disease with esophagitis, without bleeding: Secondary | ICD-10-CM | POA: Diagnosis present

## 2024-07-16 DIAGNOSIS — R112 Nausea with vomiting, unspecified: Secondary | ICD-10-CM | POA: Diagnosis not present

## 2024-07-16 DIAGNOSIS — E871 Hypo-osmolality and hyponatremia: Secondary | ICD-10-CM | POA: Diagnosis not present

## 2024-07-16 DIAGNOSIS — I5022 Chronic systolic (congestive) heart failure: Secondary | ICD-10-CM | POA: Diagnosis not present

## 2024-07-16 DIAGNOSIS — Z823 Family history of stroke: Secondary | ICD-10-CM | POA: Diagnosis not present

## 2024-07-16 DIAGNOSIS — I132 Hypertensive heart and chronic kidney disease with heart failure and with stage 5 chronic kidney disease, or end stage renal disease: Secondary | ICD-10-CM | POA: Diagnosis present

## 2024-07-16 LAB — GLUCOSE, CAPILLARY
Glucose-Capillary: 124 mg/dL — ABNORMAL HIGH (ref 70–99)
Glucose-Capillary: 155 mg/dL — ABNORMAL HIGH (ref 70–99)
Glucose-Capillary: 118 mg/dL — ABNORMAL HIGH (ref 70–99)
Glucose-Capillary: 154 mg/dL — ABNORMAL HIGH (ref 70–99)

## 2024-07-16 LAB — BASIC METABOLIC PANEL WITH GFR
Anion gap: 24 — ABNORMAL HIGH (ref 5–15)
BUN: 83 mg/dL — ABNORMAL HIGH (ref 6–20)
CO2: 21 mmol/L — ABNORMAL LOW (ref 22–32)
Calcium: 7.1 mg/dL — ABNORMAL LOW (ref 8.9–10.3)
Chloride: 87 mmol/L — ABNORMAL LOW (ref 98–111)
Creatinine, Ser: 17.4 mg/dL — ABNORMAL HIGH (ref 0.61–1.24)
GFR, Estimated: 3 mL/min — ABNORMAL LOW
Glucose, Bld: 159 mg/dL — ABNORMAL HIGH (ref 70–99)
Potassium: 4.7 mmol/L (ref 3.5–5.1)
Sodium: 132 mmol/L — ABNORMAL LOW (ref 135–145)

## 2024-07-16 LAB — CBC
HCT: 30.1 % — ABNORMAL LOW (ref 39.0–52.0)
Hemoglobin: 9.6 g/dL — ABNORMAL LOW (ref 13.0–17.0)
MCH: 27.1 pg (ref 26.0–34.0)
MCHC: 31.9 g/dL (ref 30.0–36.0)
MCV: 85 fL (ref 80.0–100.0)
Platelets: 159 K/uL (ref 150–400)
RBC: 3.54 MIL/uL — ABNORMAL LOW (ref 4.22–5.81)
RDW: 16 % — ABNORMAL HIGH (ref 11.5–15.5)
WBC: 12.8 K/uL — ABNORMAL HIGH (ref 4.0–10.5)
nRBC: 0 % (ref 0.0–0.2)

## 2024-07-16 LAB — MAGNESIUM: Magnesium: 2.1 mg/dL (ref 1.7–2.4)

## 2024-07-16 LAB — HEPATITIS B SURFACE ANTIGEN: Hepatitis B Surface Ag: NONREACTIVE

## 2024-07-16 MED ORDER — HEPARIN SODIUM (PORCINE) 1000 UNIT/ML IJ SOLN
INTRAMUSCULAR | Status: AC
  Filled 2024-07-16: qty 3

## 2024-07-16 MED ORDER — NEPRO/CARBSTEADY PO LIQD: 237.0000 mL | ORAL | Status: DC | PRN

## 2024-07-16 MED ORDER — HEPARIN SODIUM (PORCINE) 1000 UNIT/ML DIALYSIS
1500.0000 [IU] | Freq: Once | INTRAMUSCULAR | Status: AC
  Administered 2024-07-16: 1500 [IU] via INTRAVENOUS_CENTRAL

## 2024-07-16 MED ORDER — HEPARIN SODIUM (PORCINE) 1000 UNIT/ML DIALYSIS
1500.0000 [IU] | INTRAMUSCULAR | Status: AC | PRN
  Administered 2024-07-16: 1500 [IU] via INTRAVENOUS_CENTRAL

## 2024-07-16 MED ORDER — ANTICOAGULANT SODIUM CITRATE 4% (200MG/5ML) IV SOLN: 5.0000 mL | Status: DC | PRN

## 2024-07-16 MED ORDER — ALTEPLASE 2 MG IJ SOLR: 2.0000 mg | Freq: Once | INTRAMUSCULAR | Status: DC | PRN

## 2024-07-16 MED ORDER — METOCLOPRAMIDE HCL 5 MG/ML IJ SOLN
10.0000 mg | Freq: Four times a day (QID) | INTRAMUSCULAR | Status: DC
  Administered 2024-07-17 – 2024-07-18 (×6): 10 mg via INTRAVENOUS
  Filled 2024-07-16 (×7): qty 2

## 2024-07-16 MED ORDER — HEPARIN SODIUM (PORCINE) 1000 UNIT/ML DIALYSIS
1000.0000 [IU] | INTRAMUSCULAR | Status: DC | PRN
  Administered 2024-07-16: 3200 [IU]

## 2024-07-16 MED ORDER — LIDOCAINE-PRILOCAINE 2.5-2.5 % EX CREA: 1.0000 | TOPICAL_CREAM | CUTANEOUS | Status: DC | PRN

## 2024-07-16 MED ORDER — METOCLOPRAMIDE HCL 5 MG/ML IJ SOLN
10.0000 mg | Freq: Four times a day (QID) | INTRAMUSCULAR | Status: DC
  Administered 2024-07-16: 10 mg via INTRAVENOUS
  Filled 2024-07-16: qty 2

## 2024-07-16 MED ORDER — PENTAFLUOROPROP-TETRAFLUOROETH EX AERO: 1.0000 | INHALATION_SPRAY | CUTANEOUS | Status: DC | PRN

## 2024-07-16 MED ORDER — HEPARIN SODIUM (PORCINE) 1000 UNIT/ML IJ SOLN
INTRAMUSCULAR | Status: AC
  Filled 2024-07-16: qty 4

## 2024-07-16 MED ORDER — LIDOCAINE HCL (PF) 1 % IJ SOLN: 5.0000 mL | INTRAMUSCULAR | Status: DC | PRN

## 2024-07-16 NOTE — Progress Notes (Addendum)
 " [facility] Progress Note   Subjective:   Seen in room - having intense abd pain with intermittent vomiting. Denies CP/dyspnea. For HD today. Blood Cx now positive, will be having TDC removed after HD today per notes.  Objective Vitals:   07/16/24 0450 07/16/24 0825 07/16/24 0910 07/16/24 0954  BP: 132/86 (!) 172/103 (!) 184/105 (!) 179/107  Pulse: 99 (!) 103 (!) 105 100  Resp:  16    Temp: 98.6 F (37 C) 99.8 F (37.7 C)    TempSrc:  Oral    SpO2: 99% 100%    Weight:      Height:       Physical Exam General: In mod distress due to pain, room air. Blind Heart: Tachycardic, no murmur Lungs: CTAB; no rales Abdomen: soft Extremities: no LE edema Dialysis Access: [facility]  Additional Objective Labs: Basic Metabolic Panel: Recent Labs  Lab 07/14/24 0530 07/15/24 0218 07/16/24 0427  NA 138 135 132*  K 4.1 3.7 4.7  CL 94* 90* 87*  CO2 25 23 21*  GLUCOSE 142* 172* 159*  BUN 49* 66* 83*  CREATININE 12.10* 14.80* 17.40*  CALCIUM  7.8* 7.9* 7.1*   Liver Function Tests: Recent Labs  Lab 07/14/24 0530 07/15/24 0218  AST 16 24  ALT 10 17  ALKPHOS 72 64  BILITOT 0.4 0.4  PROT 8.2* 8.2*  ALBUMIN 4.4 4.3   Recent Labs  Lab 07/15/24 0218  LIPASE 13   CBC: Recent Labs  Lab 07/14/24 0530 07/15/24 0218 07/15/24 0910 07/16/24 0427  WBC 8.4 12.4*  --  12.8*  NEUTROABS 7.4  --   --   --   HGB 11.3* 10.9* 9.5* 9.6*  HCT 35.4* 33.1* 29.5* 30.1*  MCV 87.0 83.2  --  85.0  PLT 236 216  --  159   Blood Culture    Component Value Date/Time   SDES BLOOD RIGHT HAND 07/15/2024 1514   SPECREQUEST  07/15/2024 1514    BOTTLES DRAWN AEROBIC AND ANAEROBIC Blood Culture results may not be optimal due to an inadequate volume of blood received in culture bottles   CULT GRAM POSITIVE COCCI 07/15/2024 1514   REPTSTATUS PENDING 07/15/2024 1514    Cardiac Enzymes: Recent Labs  Lab 07/14/24 0530  CKTOTAL 272   CBG: Recent Labs  Lab 07/15/24 1220  07/15/24 1630 07/15/24 2052 07/16/24 0752 07/16/24 1156  GLUCAP 153* 181* 185* 124* 155*   Studies/Results: CT Angio Chest/Abd/Pel for Dissection W and/or W/WO Result Date: 07/15/2024 EXAM: CTA CHEST, ABDOMEN AND PELVIS WITHOUT AND WITH CONTRAST 07/15/2024 04:21:17 AM TECHNIQUE: CTA of the chest was performed without and with the administration of 75 mL iohexol  (OMNIPAQUE ) 350 MG/ML intravenous contrast. CTA of the abdomen and pelvis was performed without and with the administration of 75 mL iohexol  (OMNIPAQUE ) 350 MG/ML intravenous contrast. Multiplanar reformatted images are provided for review. MIP images are provided for review. Automated exposure control, iterative reconstruction, and/or weight based adjustment of the mA/kV was utilized to reduce the radiation dose to as low as reasonably achievable. COMPARISON: CT of the abdomen and pelvis dated 11/09/2023. CLINICAL HISTORY: Acute aortic syndrome (AAS) suspected. FINDINGS: VASCULATURE: AORTA: No acute finding. No abdominal aortic aneurysm. No dissection. PULMONARY ARTERIES: No pulmonary embolism with the limits of this exam. GREAT VESSELS OF AORTIC ARCH: No acute finding. No dissection. No arterial occlusion or significant stenosis. CELIAC TRUNK: No acute finding. No occlusion or significant stenosis. SUPERIOR MESENTERIC ARTERY: Mild-to-moderate calcific atheromatous disease. No significant stenosis demonstrated. INFERIOR  MESENTERIC ARTERY: No acute finding. No occlusion or significant stenosis. RENAL ARTERIES: There are 2 right renal arteries. There is a solitary left renal artery. No occlusion or significant stenosis. ILIAC ARTERIES: No acute finding. No occlusion or significant stenosis. CHEST: MEDIASTINUM: No mediastinal lymphadenopathy. The heart and pericardium demonstrate no acute abnormality. LUNGS AND PLEURA: Mild atelectasis present dependently within the lower lobes bilaterally. No focal consolidation or pulmonary edema. No evidence of  pleural effusion or pneumothorax. THORACIC BONES AND SOFT TISSUES: A right internal jugular central venous line is present. No acute bone abnormality. ABDOMEN AND PELVIS: LIVER: The liver is unremarkable. GALLBLADDER AND BILE DUCTS: Gallbladder is unremarkable. No biliary ductal dilatation. SPLEEN: The spleen is unremarkable. PANCREAS: The pancreas is unremarkable. ADRENAL GLANDS: Bilateral adrenal glands demonstrate no acute abnormality. 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 No stones in the kidneys or ureters. No hydronephrosis. No perinephric or periureteral stranding. Urinary bladder is unremarkable. GI AND BOWEL: Stomach and duodenal sweep demonstrate no acute abnormality. There is no bowel obstruction. No abnormal bowel wall thickening or distension. REPRODUCTIVE: Reproductive organs are unremarkable. PERITONEUM AND RETROPERITONEUM: No ascites or free air. LYMPH NODES: No lymphadenopathy. ABDOMINAL BONES AND SOFT TISSUES: No acute abnormality of the bones. Bilateral gynecomastia. No acute soft tissue abnormality. IMPRESSION: 1. No acute aortic syndrome. 2. Mild dependent atelectasis in the bilateral lower lobes. 3. 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 4. Right internal jugular central venous line in place. 5. Horseshoe kidney. Electronically signed by: Evalene Coho MD 07/15/2024 04:53 AM EST RP Workstation: [id number]   Medications:   ceFAZolin  (ANCEF ) IV 1 g (07/15/24 2045)    atorvastatin   40 mg Oral QPC supper   Chlorhexidine  Gluconate Cloth  6 each Topical Q0600   cloNIDine   0.1 mg Oral BID   [START ON 07/17/2024] heparin   1,500 Units Dialysis Once in dialysis   insulin  aspart  0-6 Units Subcutaneous TID WC   insulin  glargine-yfgn  7 Units Subcutaneous QHS   labetalol   200 mg Oral BID   metoCLOPramide  (REGLAN ) injection  10 mg Intravenous Q6H   minoxidil   2.5 mg Oral BID    pantoprazole  (PROTONIX ) IV  40 mg Intravenous Q12H   scopolamine   1 patch Transdermal Q72H   sucralfate   1 g Oral QID   Dialysis Orders TTS - [facility] 4hr, 400/500, EDW 92kg, 1K/2.5Ca bath, TDC, heparin  1000 + 1000/hr - Mircera 30mcg IV q 2 weeks - no VDRA  Assessment/Plan: Staph aureus bacteremia: Blood Cx 1/4 positive, on Cefazolin . WBC high. For [facility] removal after dialysis today for line holiday. Abd pain with N/V: ?Related to #1, CTA without mesenteric ischemia. On PPI + sucralfate . Pain meds and anti-emetics per primary team. ESRD: Usual TTS schedule - for HD today (1/5) prior to line removal, anticipate next HD to be either Wed or Thurs pending labs. HTN/volume: BP high, no significant edema on exam - UF as tolerated Anemia of ESRD: Hgb 9.6 - follow, resume ESA if Hgb remains low. Secondary HPTH: Ca low end, 2.5Ca bath today T2DM: Per primary.   Izetta Boehringer, PA-C 07/16/2024, 1:03 PM  [facility]    "

## 2024-07-16 NOTE — Progress Notes (Signed)
 Administered ordered PRN Tigan  intravenously instead of the ordered route of intramuscularly.  Notified patient, who verbalized understanding. Assessed patient and vital signs, no adverse reactions.  Immediately notified the nurse manager, who stated to contact the physician and the pharmacist, she then went to the bedside and also assessed the patient. I called [facility] and she stated to monitor for dizziness and seizure activity. At 0827, I paged Dr. Laxman Pokhrel and he stated to monitor the patient for 30 minutes.   Continued to monitor patient and vital signs, no adverse reactions.

## 2024-07-16 NOTE — Consult Note (Signed)
 "    [facility]    Date of Admission:  07/15/2024     Total days of antibiotics 2             Reason for Consult: MSSA bacteremia   Referring Provider: Sharyon  Primary Care Provider: Jaycee Greig PARAS, NP   ASSESSMENT:  Mr. Jelinski is a 39 year old African-American male with diabetes and end-stage renal disease on hemodialysis presenting to the hospital with acute onset nausea and vomiting and found to have MSSA bacteremia with no significant abdominal findings on imaging or clear sources of infection outside of his dialysis catheter.    Discussed plan of care to continue current dose of cefazolin  and check blood cultures for clearance of bacteremia.  Will need line holiday secondary to MSSA bacteremia with timing per nephrology.  TTE to check for endocarditis.  Standard/universal precautions.  Remaining medical and supportive care per internal medicine.  PLAN:  Continue current dose of cefazolin . Obtain blood culture for clearance of bacteremia. Line holiday per nephrology. Echocardiogram to check for endocarditis. Standard/universal precautions. Remaining medical and supportive care per internal medicine.   Principal Problem:   Intractable nausea and vomiting Active Problems:   Anemia of chronic disease   Insulin  dependent type 1 diabetes mellitus (HCC)   Hypertensive urgency   Abdominal pain   ESRD (end stage renal disease) on dialysis (HCC)   Chronic diastolic CHF (congestive heart failure) (HCC)   Prolonged QT interval   Leukocytosis   GERD (gastroesophageal reflux disease)    atorvastatin   40 mg Oral QPC supper   Chlorhexidine  Gluconate Cloth  6 each Topical Q0600   cloNIDine   0.1 mg Oral BID   insulin  aspart  0-6 Units Subcutaneous TID WC   insulin  glargine-yfgn  7 Units Subcutaneous QHS   labetalol   200 mg Oral BID   metoCLOPramide  (REGLAN ) injection  10 mg Intravenous Q6H   minoxidil   2.5 mg Oral BID   pantoprazole  (PROTONIX ) IV  40 mg  Intravenous Q12H   scopolamine   1 patch Transdermal Q72H   sucralfate   1 g Oral QID     HPI: COURAGE BIGLOW is a 39 y.o. male with previous medical history of visual impairment, diabetes, end-stage renal disease on hemodialysis, seizures and hypertension presenting to the hospital with acute onset nausea, vomiting, and bodily chills.  Mr. Poffenberger was initially seen on 07/14/2024 with abdominal pain, nausea, and generalized bodyaches.  Following workup he was discharged with symptomatic management.  Return to the ED on 07/15/2024 with continued symptoms.  Initially afebrile with Divelbiss blood cell count of 8400.  Chest x-ray with no cardiopulmonary findings.  CT angio chest/abdomen/pelvis with no acute aortic syndrome and no significant abdominal findings. Blood cultures from 07/14/2024 turned positive for MSSA.  Started on cefazolin .  Internal medicine evaluating abdominal pain and nausea of unclear origin and no significant findings on imaging.  Respiratory panel for influenza, RSV, and COVID were negative.  Mr. Johanson continues to have nausea and vomited during our visit.  No tenderness or chest pain around catheter site.  No other central lines, hardware, or devices present.    Review of Systems: Review of Systems  Constitutional:  Negative for chills, fever and weight loss.  Respiratory:  Negative for cough, shortness of breath and wheezing.   Cardiovascular:  Negative for chest pain and leg swelling.  Gastrointestinal:  Positive for abdominal pain, nausea and vomiting. Negative for constipation and diarrhea.  Skin:  Negative for rash.  Past Medical History:  Diagnosis Date   ADHD (attention deficit hyperactivity disorder)    Diabetes mellitus    ESRD on hemodialysis (HCC)    [facility]   High cholesterol    Hypertension    Hypertrophic cardiomyopathy (HCC) 02/26/2021   Seizure (HCC) 02/08/2021    Social History[1]  Family History  Problem Relation Age of Onset    Cancer Mother    Stroke Father    Diabetes Father    Hypertension Father     Allergies[2]  OBJECTIVE: Blood pressure (!) 153/99, pulse 92, temperature 98.6 F (37 C), temperature source Axillary, resp. rate 18, height 6' (1.829 m), weight 93 kg, SpO2 100%.  Physical Exam Constitutional:      General: He is not in acute distress.    Appearance: He is well-developed. He is ill-appearing.  Cardiovascular:     Rate and Rhythm: Normal rate and regular rhythm.     Heart sounds: Normal heart sounds.  Pulmonary:     Effort: Pulmonary effort is normal.     Breath sounds: Normal breath sounds.  Skin:    General: Skin is warm and dry.  Neurological:     Mental Status: He is alert and oriented to person, place, and time.     Lab Results Lab Results  Component Value Date   WBC 12.8 (H) 07/16/2024   HGB 9.6 (L) 07/16/2024   HCT 30.1 (L) 07/16/2024   MCV 85.0 07/16/2024   PLT 159 07/16/2024    Lab Results  Component Value Date   CREATININE 17.40 (H) 07/16/2024   BUN 83 (H) 07/16/2024   NA 132 (L) 07/16/2024   K 4.7 07/16/2024   CL 87 (L) 07/16/2024   CO2 21 (L) 07/16/2024    Lab Results  Component Value Date   ALT 17 07/15/2024   AST 24 07/15/2024   ALKPHOS 64 07/15/2024   BILITOT 0.4 07/15/2024     Microbiology: Recent Results (from the past 240 hours)  Resp panel by RT-PCR (RSV, Flu A&B, Covid) Anterior Nasal Swab     Status: None   Collection Time: 07/14/24  5:28 AM   Specimen: Anterior Nasal Swab  Result Value Ref Range Status   SARS Coronavirus 2 by RT PCR NEGATIVE NEGATIVE Final   Influenza A by PCR NEGATIVE NEGATIVE Final   Influenza B by PCR NEGATIVE NEGATIVE Final    Comment: (NOTE) The Xpert Xpress SARS-CoV-2/FLU/RSV plus assay is intended as an aid in the diagnosis of influenza from Nasopharyngeal swab specimens and should not be used as a sole basis for treatment. Nasal washings and aspirates are unacceptable for Xpert Xpress  SARS-CoV-2/FLU/RSV testing.  Fact Sheet for Patients: bloggercourse.com  Fact Sheet for Healthcare Providers: seriousbroker.it  This test is not yet approved or cleared by the [place]  FDA and has been authorized for detection and/or diagnosis of SARS-CoV-2 by FDA under an Emergency Use Authorization (EUA). This EUA will remain in effect (meaning this test can be used) for the duration of the COVID-19 declaration under Section 564(b)(1) of the Act, 21 U.S.C. section 360bbb-3(b)(1), unless the authorization is terminated or revoked.     Resp Syncytial Virus by PCR NEGATIVE NEGATIVE Final    Comment: (NOTE) Fact Sheet for Patients: bloggercourse.com  Fact Sheet for Healthcare Providers: seriousbroker.it  This test is not yet approved or cleared by the [place]  FDA and has been authorized for detection and/or diagnosis of SARS-CoV-2 by FDA under an Emergency Use Authorization (EUA). This EUA  will remain in effect (meaning this test can be used) for the duration of the COVID-19 declaration under Section 564(b)(1) of the Act, 21 U.S.C. section 360bbb-3(b)(1), unless the authorization is terminated or revoked.  Performed at [facility], [address], [place], [place] [address]   Culture, blood (routine x 2)     Status: Abnormal (Preliminary result)   Collection Time: 07/15/24  3:29 AM   Specimen: BLOOD  Result Value Ref Range Status   Specimen Description BLOOD SITE NOT SPECIFIED  Final   Special Requests   Final    BOTTLES DRAWN AEROBIC AND ANAEROBIC Blood Culture results may not be optimal due to an inadequate volume of blood received in culture bottles   Culture  Setup Time   Final    GRAM POSITIVE COCCI IN CLUSTERS IN BOTH AEROBIC AND ANAEROBIC BOTTLES CRITICAL RESULT CALLED TO, READ BACK BY AND VERIFIED WITH: PHARMD ERIN BILLY [id number] AT 1759 BY EC     Culture (A)  Final    STAPHYLOCOCCUS AUREUS SUSCEPTIBILITIES TO FOLLOW Performed at [facility], [address] [place], [place] [address]    Report Status PENDING  Incomplete  Blood Culture ID Panel (Reflexed)     Status: Abnormal   Collection Time: 07/15/24  3:29 AM  Result Value Ref Range Status   Enterococcus faecalis NOT DETECTED NOT DETECTED Final   Enterococcus Faecium NOT DETECTED NOT DETECTED Final   Listeria monocytogenes NOT DETECTED NOT DETECTED Final   Staphylococcus species DETECTED (A) NOT DETECTED Final    Comment: CRITICAL RESULT CALLED TO, READ BACK BY AND VERIFIED WITH: PHARMD ERIN WILLIAMS ON [id number] AT 1759 BY EC    Staphylococcus aureus (BCID) DETECTED (A) NOT DETECTED Final    Comment: CRITICAL RESULT CALLED TO, READ BACK BY AND VERIFIED WITH: PHARMD ROCKY BILLY [id number] AT 1759 BY EC    Staphylococcus epidermidis NOT DETECTED NOT DETECTED Final   Staphylococcus lugdunensis NOT DETECTED NOT DETECTED Final   Streptococcus species NOT DETECTED NOT DETECTED Final   Streptococcus agalactiae NOT DETECTED NOT DETECTED Final   Streptococcus pneumoniae NOT DETECTED NOT DETECTED Final   Streptococcus pyogenes NOT DETECTED NOT DETECTED Final   A.calcoaceticus-baumannii NOT DETECTED NOT DETECTED Final   Bacteroides fragilis NOT DETECTED NOT DETECTED Final   Enterobacterales NOT DETECTED NOT DETECTED Final   Enterobacter cloacae complex NOT DETECTED NOT DETECTED Final   Escherichia coli NOT DETECTED NOT DETECTED Final   Klebsiella aerogenes NOT DETECTED NOT DETECTED Final   Klebsiella oxytoca NOT DETECTED NOT DETECTED Final   Klebsiella pneumoniae NOT DETECTED NOT DETECTED Final   Proteus species NOT DETECTED NOT DETECTED Final   Salmonella species NOT DETECTED NOT DETECTED Final   Serratia marcescens NOT DETECTED NOT DETECTED Final   Haemophilus influenzae NOT DETECTED NOT DETECTED Final   Neisseria meningitidis NOT DETECTED NOT DETECTED Final    Pseudomonas aeruginosa NOT DETECTED NOT DETECTED Final   Stenotrophomonas maltophilia NOT DETECTED NOT DETECTED Final   Candida albicans NOT DETECTED NOT DETECTED Final   Candida auris NOT DETECTED NOT DETECTED Final   Candida glabrata NOT DETECTED NOT DETECTED Final   Candida krusei NOT DETECTED NOT DETECTED Final   Candida parapsilosis NOT DETECTED NOT DETECTED Final   Candida tropicalis NOT DETECTED NOT DETECTED Final   Cryptococcus neoformans/gattii NOT DETECTED NOT DETECTED Final   Meth resistant mecA/C and MREJ NOT DETECTED NOT DETECTED Final    Comment: Performed at [facility], [address], [place],   [address]  Respiratory (~20 pathogens) panel by PCR     Status: None   Collection Time: 07/15/24  8:03 AM   Specimen: Nasopharyngeal Swab; Respiratory  Result Value Ref Range Status   Adenovirus NOT DETECTED NOT DETECTED Final   Coronavirus 229E NOT DETECTED NOT DETECTED Final    Comment: (NOTE) The Coronavirus on the Respiratory Panel, DOES NOT test for the novel  Coronavirus (2019 nCoV)    Coronavirus HKU1 NOT DETECTED NOT DETECTED Final   Coronavirus NL63 NOT DETECTED NOT DETECTED Final   Coronavirus OC43 NOT DETECTED NOT DETECTED Final   Metapneumovirus NOT DETECTED NOT DETECTED Final   Rhinovirus / Enterovirus NOT DETECTED NOT DETECTED Final   Influenza A NOT DETECTED NOT DETECTED Final   Influenza B NOT DETECTED NOT DETECTED Final   Parainfluenza Virus 1 NOT DETECTED NOT DETECTED Final   Parainfluenza Virus 2 NOT DETECTED NOT DETECTED Final   Parainfluenza Virus 3 NOT DETECTED NOT DETECTED Final   Parainfluenza Virus 4 NOT DETECTED NOT DETECTED Final   Respiratory Syncytial Virus NOT DETECTED NOT DETECTED Final   Bordetella pertussis NOT DETECTED NOT DETECTED Final   Bordetella Parapertussis NOT DETECTED NOT DETECTED Final   Chlamydophila pneumoniae NOT DETECTED NOT DETECTED Final   Mycoplasma pneumoniae NOT DETECTED NOT DETECTED Final    Comment:  Performed at [facility], [address], [place], [place] [address]  Culture, blood (routine x 2)     Status: None (Preliminary result)   Collection Time: 07/15/24  3:14 PM   Specimen: BLOOD RIGHT HAND  Result Value Ref Range Status   Specimen Description BLOOD RIGHT HAND  Final   Special Requests   Final    BOTTLES DRAWN AEROBIC AND ANAEROBIC Blood Culture results may not be optimal due to an inadequate volume of blood received in culture bottles   Culture  Setup Time   Final    GRAM POSITIVE COCCI IN BOTH AEROBIC AND ANAEROBIC BOTTLES CRITICAL VALUE NOTED.  VALUE IS CONSISTENT WITH PREVIOUSLY REPORTED AND CALLED VALUE. Performed at [facility], [address], [place], [place] [address]    Culture GRAM POSITIVE COCCI  Final   Report Status PENDING  Incomplete     Cathlyn July, NP [facility]  07/16/2024  3:59 PM     [1]  Social History Tobacco Use   Smoking status: Never    Passive exposure: Never   Smokeless tobacco: Never  Vaping Use   Vaping status: Never Used  Substance Use Topics   Alcohol use: Not Currently    Comment: social drinker   Drug use: No  [2]  Allergies Allergen Reactions   Apresoline  [Hydralazine ] Nausea And Vomiting and Other (See Comments)    Patient was taking Hydralazine  AND Amlodipine  at the same time, so the reactions came from one of the 2: Lethargy and an all-over feeling of NOT feeling well   Norvasc  [Amlodipine ] Nausea And Vomiting and Other (See Comments)    Patient was taking Amlodipine  AND Hydralazine  at the same time, so the reactions came from one of the 2: Lethargy and an all-over feeling of NOT feeling well    "

## 2024-07-16 NOTE — Care Management CC44 (Signed)
"         Condition Code 44 Documentation Completed  Patient Details  Name: [name] MRN: [id number] Date of Birth: 09-02-1985   Condition Code 44 given:  Yes Patient signature on Condition Code 44 notice:  Yes Documentation of 2 MD's agreement:  Yes Code 44 added to claim:  Yes    Stephane Powell Jansky, RN 07/16/2024, 10:18 AM  "

## 2024-07-16 NOTE — Progress Notes (Signed)
 " PROGRESS NOTE  [name] FMW:[id number] DOB: [birth year] DOA: 07/15/2024 PCP: Jaycee Greig PARAS, NP   LOS: 1 day   Brief narrative:   [name] is a 39 y.o. male with medical history significant of hypertension, hyperlipidemia, diastolic CHF, diabetes mellitus type 2,  ESRD on hemodialysis (TTS), and GERD with prior presents with persistent vomiting and abdominal pain.  In the ED patient was slightly tachycardic tachypneic and had low-grade fever with elevated blood pressure.  Chest x-ray without any infiltrate.   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  Patient had been given antiemetics, fentanyl  IV for pain, and Protonix  40 mg IV and was around hospital for further evaluation and treatment.   Assessment/Plan: Principal Problem:   Intractable nausea and vomiting Active Problems:   Abdominal pain   Leukocytosis   Hypertensive urgency   Anemia of chronic disease   Prolonged QT interval   ESRD (end stage renal disease) on dialysis (HCC)   Insulin  dependent type 1 diabetes mellitus (HCC)   Chronic diastolic CHF (congestive heart failure) (HCC)   GERD (gastroesophageal reflux disease)  Intractable nausea and vomiting Abdominal pain Coffee-ground emesis.  Question viral illness.  CT angiogram of the chest abdomen and pelvis did not show acute cause for patient's symptoms.  Question  gastritis/esophagitis vs. gastroparesis/dysmotility vs. Gastroenteritis. CTA showing no significant stenosis or sign of obstruction.  Supportive care scopolamine  patch.  Flu COVID and influenza was negative.  Patient is still continues to have intractable vomiting so we will try Reglan  doses.  QTc has improved at this time.  Staphylococcus aureus bacteremia on blood cultures.  Repeat blood cultures have  been ordered today.  ID has been consulted.  Plan is to remove hemodialysis catheter today.  Leukocytosis Likely reactive.  WBC at 12.8 from 12.4.  No fever or chills.  ID on board.  Repeat cultures have been sent.  Preliminary blood culture with Staph aureus.  Plan is hemodialysis catheter removal today.   Hypertensive urgency Continue clonidine  labetalol  and minoxidil , add as needed hydralazine .  Continue to monitor closely.   Anemia of chronic kidney disease Latest hemoglobin of 9.6.  Will monitor.  ESRD on HD Elevated anion gap On Tuesday Thursday Saturday schedule.  Hemodialysis as per nephrology.   Insulin -dependent diabetes mellitus  Recent hemoglobin A1c of 6.5 when checked on 06/18/2024.  Continue Semglee  at night and sliding scale insulin .   Diastolic congestive heart failure Compensated at this time.  Review of last echocardiogram with LV EF to be 60 to 65% with grade 3 diastolic dysfunction on 10/2022.  Continue fluid management with dialysis.   GERD with nausea History of EGD LA grade D esophagitis, Mallory-Weiss tear, gastric polyp, and gastritis.  Continue Protonix  40 twice daily.- Resume Carafate  once able    DVT prophylaxis: SCDs Start: 07/15/24 0801   Disposition: Home likely in 1 to 2 days  Status is: Inpatient Remains inpatient appropriate because: Pending clinical improvement, IV antibiotic, plan for hemodialysis catheter removal    Code Status:     Code Status: Full Code  Family Communication: Spoke with the patient's aunt at bedside.  Consultants: Nephrology Infectious disease  Procedures: Plan for hemodialysis line removal. Hemodialysis  Anti-infectives:  Cefazolin  IV  Anti-infectives (From admission, onward)    Start     Dose/Rate Route Frequency Ordered Stop  07/15/24 1815  ceFAZolin  (ANCEF ) IVPB 1 g/50 mL premix        1 g 100 mL/hr over 30 Minutes Intravenous Every 24 hours 07/15/24 1805          Subjective: Today, patient was  seen and examined at bedside.  ??  Objective: Vitals:   07/16/24 0910 07/16/24 0954  BP: (!) 184/105 (!) 179/107  Pulse: (!) 105 100  Resp:    Temp:    SpO2:      Intake/Output Summary (Last 24 hours) at 07/16/2024 1248 Last data filed at 07/16/2024 0300 Gross per 24 hour  Intake 443.76 ml  Output 250 ml  Net 193.76 ml   Filed Weights   07/15/24 0155  Weight: 90.7 kg   Body mass index is 27.12 kg/m.   Physical Exam:  GENERAL: Patient is alert awake and oriented.  Mild distress.  Legally blind HENT: No scleral pallor or icterus. Pupils equally reactive to light. Oral mucosa is moist, right internal jugular hemodialysis catheter in place NECK: is supple, no gross swelling noted. CHEST: Clear to auscultation. No crackles or wheezes.  Diminished breath sounds bilaterally. CVS: S1 and S2 heard, no murmur. Regular rate and rhythm.  ABDOMEN: Soft, non-tender, bowel sounds are present. EXTREMITIES: No edema.  Left upper extremity AV fistula in place with staples. CNS: Cranial nerves are intact. No focal motor deficits. SKIN: warm and dry without rashes.  Data Review: I have personally reviewed the following laboratory data and studies,  CBC: Recent Labs  Lab 07/14/24 0530 07/15/24 0218 07/15/24 0910 07/16/24 0427  WBC 8.4 12.4*  --  12.8*  NEUTROABS 7.4  --   --   --   HGB 11.3* 10.9* 9.5* 9.6*  HCT 35.4* 33.1* 29.5* 30.1*  MCV 87.0 83.2  --  85.0  PLT 236 216  --  159   Basic Metabolic Panel: Recent Labs  Lab 07/14/24 0530 07/15/24 0218 07/16/24 0427  NA 138 135 132*  K 4.1 3.7 4.7  CL 94* 90* 87*  CO2 25 23 21*  GLUCOSE 142* 172* 159*  BUN 49* 66* 83*  CREATININE 12.10* 14.80* 17.40*  CALCIUM  7.8* 7.9* 7.1*  MG 2.0  --  2.1   Liver Function Tests: Recent Labs  Lab 07/14/24 0530 07/15/24 0218  AST 16 24  ALT 10 17  ALKPHOS 72 64  BILITOT 0.4 0.4  PROT 8.2* 8.2*  ALBUMIN 4.4 4.3   Recent Labs  Lab 07/15/24 0218  LIPASE 13   No results for  input(s): AMMONIA in the last 168 hours. Cardiac Enzymes: Recent Labs  Lab 07/14/24 0530  CKTOTAL 272   BNP (last 3 results) Recent Labs    08/02/23 1120  BNP 3,974.0*    ProBNP (last 3 results) No results for input(s): PROBNP in the last 8760 hours.  CBG: Recent Labs  Lab 07/15/24 1220 07/15/24 1630 07/15/24 2052 07/16/24 0752 07/16/24 1156  GLUCAP 153* 181* 185* 124* 155*   Recent Results (from the past 240 hours)  Resp panel by RT-PCR (RSV, Flu A&B, Covid) Anterior Nasal Swab     Status: None   Collection Time: 07/14/24  5:28 AM   Specimen: Anterior Nasal Swab  Result Value Ref Range Status   SARS Coronavirus 2 by RT PCR NEGATIVE NEGATIVE Final   Influenza A by PCR NEGATIVE NEGATIVE Final   Influenza B by PCR NEGATIVE NEGATIVE Final    Comment: (NOTE) The Xpert Xpress SARS-CoV-2/FLU/RSV plus assay is intended as an aid  in the diagnosis of influenza from Nasopharyngeal swab specimens and should not be used as a sole basis for treatment. Nasal washings and aspirates are unacceptable for Xpert Xpress SARS-CoV-2/FLU/RSV testing.  Fact Sheet for Patients: bloggercourse.com  Fact Sheet for Healthcare Providers: seriousbroker.it  This test is not yet approved or cleared by the [place]  FDA and has been authorized for detection and/or diagnosis of SARS-CoV-2 by FDA under an Emergency Use Authorization (EUA). This EUA will remain in effect (meaning this test can be used) for the duration of the COVID-19 declaration under Section 564(b)(1) of the Act, 21 U.S.C. section 360bbb-3(b)(1), unless the authorization is terminated or revoked.     Resp Syncytial Virus by PCR NEGATIVE NEGATIVE Final    Comment: (NOTE) Fact Sheet for Patients: bloggercourse.com  Fact Sheet for Healthcare Providers: seriousbroker.it  This test is not yet approved or cleared by the  [place]  FDA and has been authorized for detection and/or diagnosis of SARS-CoV-2 by FDA under an Emergency Use Authorization (EUA). This EUA will remain in effect (meaning this test can be used) for the duration of the COVID-19 declaration under Section 564(b)(1) of the Act, 21 U.S.C. section 360bbb-3(b)(1), unless the authorization is terminated or revoked.  Performed at [facility], [address], [place], [place] [address]   Culture, blood (routine x 2)     Status: Abnormal (Preliminary result)   Collection Time: 07/15/24  3:29 AM   Specimen: BLOOD  Result Value Ref Range Status   Specimen Description BLOOD SITE NOT SPECIFIED  Final   Special Requests   Final    BOTTLES DRAWN AEROBIC AND ANAEROBIC Blood Culture results may not be optimal due to an inadequate volume of blood received in culture bottles   Culture  Setup Time   Final    GRAM POSITIVE COCCI IN CLUSTERS IN BOTH AEROBIC AND ANAEROBIC BOTTLES CRITICAL RESULT CALLED TO, READ BACK BY AND VERIFIED WITH: PHARMD ERIN BILLY [id number] AT 1759 BY EC    Culture (A)  Final    STAPHYLOCOCCUS AUREUS SUSCEPTIBILITIES TO FOLLOW Performed at [facility], [address], [place], [place] [address]    Report Status PENDING  Incomplete  Blood Culture ID Panel (Reflexed)     Status: Abnormal   Collection Time: 07/15/24  3:29 AM  Result Value Ref Range Status   Enterococcus faecalis NOT DETECTED NOT DETECTED Final   Enterococcus Faecium NOT DETECTED NOT DETECTED Final   Listeria monocytogenes NOT DETECTED NOT DETECTED Final   Staphylococcus species DETECTED (A) NOT DETECTED Final    Comment: CRITICAL RESULT CALLED TO, READ BACK BY AND VERIFIED WITH: PHARMD ERIN WILLIAMS ON [id number] AT 1759 BY EC    Staphylococcus aureus (BCID) DETECTED (A) NOT DETECTED Final    Comment: CRITICAL RESULT CALLED TO, READ BACK BY AND VERIFIED WITH: PHARMD ROCKY BILLY [id number] AT 1759 BY EC    Staphylococcus epidermidis NOT  DETECTED NOT DETECTED Final   Staphylococcus lugdunensis NOT DETECTED NOT DETECTED Final   Streptococcus species NOT DETECTED NOT DETECTED Final   Streptococcus agalactiae NOT DETECTED NOT DETECTED Final   Streptococcus pneumoniae NOT DETECTED NOT DETECTED Final   Streptococcus pyogenes NOT DETECTED NOT DETECTED Final   A.calcoaceticus-baumannii NOT DETECTED NOT DETECTED Final   Bacteroides fragilis NOT DETECTED NOT DETECTED Final   Enterobacterales NOT DETECTED NOT DETECTED Final   Enterobacter cloacae complex NOT DETECTED NOT DETECTED Final   Escherichia coli NOT DETECTED NOT DETECTED Final   Klebsiella aerogenes NOT  DETECTED NOT DETECTED Final   Klebsiella oxytoca NOT DETECTED NOT DETECTED Final   Klebsiella pneumoniae NOT DETECTED NOT DETECTED Final   Proteus species NOT DETECTED NOT DETECTED Final   Salmonella species NOT DETECTED NOT DETECTED Final   Serratia marcescens NOT DETECTED NOT DETECTED Final   Haemophilus influenzae NOT DETECTED NOT DETECTED Final   Neisseria meningitidis NOT DETECTED NOT DETECTED Final   Pseudomonas aeruginosa NOT DETECTED NOT DETECTED Final   Stenotrophomonas maltophilia NOT DETECTED NOT DETECTED Final   Candida albicans NOT DETECTED NOT DETECTED Final   Candida auris NOT DETECTED NOT DETECTED Final   Candida glabrata NOT DETECTED NOT DETECTED Final   Candida krusei NOT DETECTED NOT DETECTED Final   Candida parapsilosis NOT DETECTED NOT DETECTED Final   Candida tropicalis NOT DETECTED NOT DETECTED Final   Cryptococcus neoformans/gattii NOT DETECTED NOT DETECTED Final   Meth resistant mecA/C and MREJ NOT DETECTED NOT DETECTED Final    Comment: Performed at [facility], [address] [place], [place] [address]  Respiratory (~20 pathogens) panel by PCR     Status: None   Collection Time: 07/15/24  8:03 AM   Specimen: Nasopharyngeal Swab; Respiratory  Result Value Ref Range Status   Adenovirus NOT DETECTED NOT DETECTED Final   Coronavirus  229E NOT DETECTED NOT DETECTED Final    Comment: (NOTE) The Coronavirus on the Respiratory Panel, DOES NOT test for the novel  Coronavirus (2019 nCoV)    Coronavirus HKU1 NOT DETECTED NOT DETECTED Final   Coronavirus NL63 NOT DETECTED NOT DETECTED Final   Coronavirus OC43 NOT DETECTED NOT DETECTED Final   Metapneumovirus NOT DETECTED NOT DETECTED Final   Rhinovirus / Enterovirus NOT DETECTED NOT DETECTED Final   Influenza A NOT DETECTED NOT DETECTED Final   Influenza B NOT DETECTED NOT DETECTED Final   Parainfluenza Virus 1 NOT DETECTED NOT DETECTED Final   Parainfluenza Virus 2 NOT DETECTED NOT DETECTED Final   Parainfluenza Virus 3 NOT DETECTED NOT DETECTED Final   Parainfluenza Virus 4 NOT DETECTED NOT DETECTED Final   Respiratory Syncytial Virus NOT DETECTED NOT DETECTED Final   Bordetella pertussis NOT DETECTED NOT DETECTED Final   Bordetella Parapertussis NOT DETECTED NOT DETECTED Final   Chlamydophila pneumoniae NOT DETECTED NOT DETECTED Final   Mycoplasma pneumoniae NOT DETECTED NOT DETECTED Final    Comment: Performed at [facility], [address], [place], [place] [address]  Culture, blood (routine x 2)     Status: None (Preliminary result)   Collection Time: 07/15/24  3:14 PM   Specimen: BLOOD RIGHT HAND  Result Value Ref Range Status   Specimen Description BLOOD RIGHT HAND  Final   Special Requests   Final    BOTTLES DRAWN AEROBIC AND ANAEROBIC Blood Culture results may not be optimal due to an inadequate volume of blood received in culture bottles   Culture  Setup Time   Final    GRAM POSITIVE COCCI IN BOTH AEROBIC AND ANAEROBIC BOTTLES CRITICAL VALUE NOTED.  VALUE IS CONSISTENT WITH PREVIOUSLY REPORTED AND CALLED VALUE. Performed at [facility], [address] [place], [place] [address]    Culture GRAM POSITIVE COCCI  Final   Report Status PENDING  Incomplete     Studies: CT Angio Chest/Abd/Pel for Dissection W and/or W/WO Result Date:  07/15/2024 EXAM: CTA CHEST, ABDOMEN AND PELVIS WITHOUT AND WITH CONTRAST 07/15/2024 04:21:17 AM TECHNIQUE: CTA of the chest was performed without and with the administration of 75 mL  iohexol  (OMNIPAQUE ) 350 MG/ML intravenous contrast. CTA of the abdomen and pelvis was performed without and with the administration of 75 mL iohexol  (OMNIPAQUE ) 350 MG/ML intravenous contrast. Multiplanar reformatted images are provided for review. MIP images are provided for review. Automated exposure control, iterative reconstruction, and/or weight based adjustment of the mA/kV was utilized to reduce the radiation dose to as low as reasonably achievable. COMPARISON: CT of the abdomen and pelvis dated 11/09/2023. CLINICAL HISTORY: Acute aortic syndrome (AAS) suspected. FINDINGS: VASCULATURE: AORTA: No acute finding. No abdominal aortic aneurysm. No dissection. PULMONARY ARTERIES: No pulmonary embolism with the limits of this exam. GREAT VESSELS OF AORTIC ARCH: No acute finding. No dissection. No arterial occlusion or significant stenosis. CELIAC TRUNK: No acute finding. No occlusion or significant stenosis. SUPERIOR MESENTERIC ARTERY: Mild-to-moderate calcific atheromatous disease. No significant stenosis demonstrated. INFERIOR MESENTERIC ARTERY: No acute finding. No occlusion or significant stenosis. RENAL ARTERIES: There are 2 right renal arteries. There is a solitary left renal artery. No occlusion or significant stenosis. ILIAC ARTERIES: No acute finding. No occlusion or significant stenosis. CHEST: MEDIASTINUM: No mediastinal lymphadenopathy. The heart and pericardium demonstrate no acute abnormality. LUNGS AND PLEURA: Mild atelectasis present dependently within the lower lobes bilaterally. No focal consolidation or pulmonary edema. No evidence of pleural effusion or pneumothorax. THORACIC BONES AND SOFT TISSUES: A right internal jugular central venous line is present. No acute bone abnormality. ABDOMEN AND PELVIS: LIVER: The liver  is unremarkable. GALLBLADDER AND BILE DUCTS: Gallbladder is unremarkable. No biliary ductal dilatation. SPLEEN: The spleen is unremarkable. PANCREAS: The pancreas is unremarkable. ADRENAL GLANDS: Bilateral adrenal glands demonstrate no acute abnormality. 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 No stones in the kidneys or ureters. No hydronephrosis. No perinephric or periureteral stranding. Urinary bladder is unremarkable. GI AND BOWEL: Stomach and duodenal sweep demonstrate no acute abnormality. There is no bowel obstruction. No abnormal bowel wall thickening or distension. REPRODUCTIVE: Reproductive organs are unremarkable. PERITONEUM AND RETROPERITONEUM: No ascites or free air. LYMPH NODES: No lymphadenopathy. ABDOMINAL BONES AND SOFT TISSUES: No acute abnormality of the bones. Bilateral gynecomastia. No acute soft tissue abnormality. IMPRESSION: 1. No acute aortic syndrome. 2. Mild dependent atelectasis in the bilateral lower lobes. 3. 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 4. Right internal jugular central venous line in place. 5. Horseshoe kidney. Electronically signed by: Evalene Coho MD 07/15/2024 04:53 AM EST RP Workstation: [id number]      Vernal Alstrom, MD  [facility] 07/16/2024  If 7PM-7AM, please contact night-coverage             "

## 2024-07-16 NOTE — Progress Notes (Signed)
 Pt receives out-pt HD at [facility] on TTS 10:15 am chair time. Will assist as needed.   Randine Mungo Dialysis Navigator [phone]

## 2024-07-16 NOTE — Plan of Care (Signed)
" °  Problem: Nutritional: Goal: Maintenance of adequate nutrition will improve Outcome: Progressing   Problem: Skin Integrity: Goal: Risk for impaired skin integrity will decrease Outcome: Progressing   Problem: Education: Goal: Knowledge of General Education information will improve Description: Including pain rating scale, medication(s)/side effects and non-pharmacologic comfort measures Outcome: Progressing   Problem: Safety: Goal: Ability to remain free from injury will improve Outcome: Progressing   "

## 2024-07-16 NOTE — Progress Notes (Signed)
" °   07/16/24 1821  Vitals  Temp 98.3 F (36.8 C)  Temp Source Oral  During Treatment Monitoring  HD Safety Checks Performed Yes  Post Treatment  Dialyzer Clearance Lightly streaked  Hemodialysis Intake (mL) 0 mL  Liters Processed 72  Fluid Removed (mL) 2000 mL  Tolerated HD Treatment Yes  AVG/AVF Arterial Site Held (minutes) 0 minutes  AVG/AVF Venous Site Held (minutes) 0 minutes  Hemodialysis Catheter Right Internal jugular Double lumen Permanent (Tunneled)  Placement Date/Time: 04/25/24 1212   Placed prior to admission: No  Serial / Lot #: [id number]  Expiration Date: 10/09/28  Time Out: Correct patient;Correct site;Correct procedure  Maximum sterile barrier precautions: Hand hygiene;Large sterile sheet;S...  Site Condition No complications  Blue Lumen Status Flushed;Heparin  locked;Antimicrobial dead end cap  Red Lumen Status Flushed;Heparin  locked;Antimicrobial dead end cap  Purple Lumen Status N/A  Catheter fill solution Heparin  1000 units/ml  Catheter fill volume (Arterial) 1.6 cc  Catheter fill volume (Venous) 1.6  Dressing Type Transparent  Dressing Status Antimicrobial disc/dressing in place;Clean, Dry, Intact  Interventions Other (Comment) (Deaccessed)  Drainage Description None  Post treatment catheter status Capped and Clamped    "

## 2024-07-16 NOTE — Plan of Care (Signed)

## 2024-07-16 NOTE — Care Management Obs Status (Signed)
 MEDICARE OBSERVATION STATUS NOTIFICATION   Patient Details  Name: [name] MRN: [id number] Date of Birth: [birth year]   Medicare Observation Status Notification Given:  Yes    Stephane Powell Jansky, RN 07/16/2024, 10:18 AM

## 2024-07-16 NOTE — TOC CM/SW Note (Signed)
 Went to patient's room to do code 44, however patient violently dry heaving. Nurse has medicated. Code 44 not given

## 2024-07-16 NOTE — Hospital Course (Addendum)
 [name]

## 2024-07-16 NOTE — Progress Notes (Addendum)
 Called the staffing by secretary for the video monitor ,stated that they don't have one now  and will call back, but never received one . Patient is calm and follows command

## 2024-07-17 ENCOUNTER — Inpatient Hospital Stay (HOSPITAL_COMMUNITY)

## 2024-07-17 DIAGNOSIS — T827XXA Infection and inflammatory reaction due to other cardiac and vascular devices, implants and grafts, initial encounter: Secondary | ICD-10-CM

## 2024-07-17 DIAGNOSIS — N186 End stage renal disease: Secondary | ICD-10-CM | POA: Diagnosis not present

## 2024-07-17 DIAGNOSIS — B9689 Other specified bacterial agents as the cause of diseases classified elsewhere: Secondary | ICD-10-CM

## 2024-07-17 DIAGNOSIS — R112 Nausea with vomiting, unspecified: Secondary | ICD-10-CM | POA: Diagnosis not present

## 2024-07-17 LAB — GLUCOSE, CAPILLARY
Glucose-Capillary: 132 mg/dL — ABNORMAL HIGH (ref 70–99)
Glucose-Capillary: 139 mg/dL — ABNORMAL HIGH (ref 70–99)
Glucose-Capillary: 177 mg/dL — ABNORMAL HIGH (ref 70–99)
Glucose-Capillary: 162 mg/dL — ABNORMAL HIGH (ref 70–99)
Glucose-Capillary: 121 mg/dL — ABNORMAL HIGH (ref 70–99)

## 2024-07-17 LAB — CULTURE, BLOOD (ROUTINE X 2)

## 2024-07-17 LAB — BASIC METABOLIC PANEL WITH GFR
Anion gap: 18 — ABNORMAL HIGH (ref 5–15)
BUN: 50 mg/dL — ABNORMAL HIGH (ref 6–20)
CO2: 26 mmol/L (ref 22–32)
Calcium: 7.7 mg/dL — ABNORMAL LOW (ref 8.9–10.3)
Chloride: 87 mmol/L — ABNORMAL LOW (ref 98–111)
Creatinine, Ser: 12.2 mg/dL — ABNORMAL HIGH (ref 0.61–1.24)
GFR, Estimated: 5 mL/min — ABNORMAL LOW
Glucose, Bld: 122 mg/dL — ABNORMAL HIGH (ref 70–99)
Potassium: 3.5 mmol/L (ref 3.5–5.1)
Sodium: 131 mmol/L — ABNORMAL LOW (ref 135–145)

## 2024-07-17 LAB — HEPATITIS B SURFACE ANTIBODY, QUANTITATIVE: Hep B S AB Quant (Post): 7355 m[IU]/mL

## 2024-07-17 LAB — CBC
HCT: 29.1 % — ABNORMAL LOW (ref 39.0–52.0)
Hemoglobin: 9.6 g/dL — ABNORMAL LOW (ref 13.0–17.0)
MCH: 27.4 pg (ref 26.0–34.0)
MCHC: 33 g/dL (ref 30.0–36.0)
MCV: 82.9 fL (ref 80.0–100.0)
Platelets: 182 K/uL (ref 150–400)
RBC: 3.51 MIL/uL — ABNORMAL LOW (ref 4.22–5.81)
RDW: 16.4 % — ABNORMAL HIGH (ref 11.5–15.5)
WBC: 7.4 K/uL (ref 4.0–10.5)
nRBC: 0 % (ref 0.0–0.2)

## 2024-07-17 LAB — MAGNESIUM: Magnesium: 2.2 mg/dL (ref 1.7–2.4)

## 2024-07-17 MED ORDER — LIDOCAINE-EPINEPHRINE 1 %-1:100000 IJ SOLN
10.0000 mL | Freq: Once | INTRAMUSCULAR | Status: AC
  Administered 2024-07-17: 10 mL via INTRADERMAL
  Filled 2024-07-17 (×2): qty 20

## 2024-07-17 NOTE — Progress Notes (Signed)
 " James NOTE  CADARIUS NEVARES FMW:[id number] DOB: 02/14/1986 DOA: 07/15/2024 PCP: Jaycee Greig PARAS, [name]   LOS: 2 days   Brief narrative:   TAKESHI TEASDALE is a 40 y.o. male with medical history significant of hypertension, hyperlipidemia, diastolic CHF, diabetes mellitus type 2,  ESRD on hemodialysis (TTS), and GERD with prior presents with persistent vomiting and abdominal pain.  In the ED patient was slightly tachycardic tachypneic and had low-grade fever with elevated blood pressure.  Chest x-ray without any infiltrate.   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  Patient had been given antiemetics, fentanyl  IV for pain, and Protonix  40 mg IV and was around hospital for further evaluation and treatment.   Assessment/Plan: Principal Problem:   Intractable nausea and vomiting Active Problems:   Abdominal pain   Leukocytosis   Hypertensive urgency   Anemia of chronic disease   Prolonged QT interval   ESRD (end stage renal disease) on dialysis (HCC)   Insulin  dependent type 1 diabetes mellitus (HCC)   Chronic diastolic CHF (congestive heart failure) (HCC)   GERD (gastroesophageal reflux disease)   ESRD (end stage renal disease) (HCC)  Intractable nausea and vomiting Abdominal pain  Discussed about diabetic gastroparesis and small portion low residue diet on discharge.  CTA showing no significant stenosis or sign of obstruction.  Continue supportive care scopolamine  patch.  Flu COVID and influenza was negative.  Leukocytosis.  Has tolerated clears well.   Seems like Reglan  helps him a lot. Will advance to full liquids today.  Reinforced against narcotics if possible.  Staphylococcus aureus bacteremia on blood cultures.  Repeat blood cultures have been ordered today.  ID has been consulted.   Plan is to remove hemodialysis catheter today.  Leukocytosis Likely reactive.  WBC at 12.8 from 12.4.  No fever or chills.  ID on board.  Repeat cultures have been sent on 07/16/2024 and pending..  Preliminary blood culture with Staph aureus from 07/15/2024..  Surgery has been consulted for hemodialysis catheter removal.   Hypertensive urgency Continue clonidine  labetalol  and minoxidil ,.  Continue to monitor closely.   Anemia of chronic kidney disease Latest hemoglobin of 9.6.  Will monitor.  ESRD on HD Elevated anion gap On Tuesday Thursday Saturday schedule.  Hemodialysis as per nephrology.   Insulin -dependent diabetes mellitus  Recent hemoglobin A1c of 6.5  on 06/18/2024.  Continue Semglee  at night and sliding scale insulin .  Overall glycemic control adequate.   Diastolic congestive heart failure Compensated at this time.  Review of last echocardiogram with LV EF to be 60 to 65% with grade 3 diastolic dysfunction on 10/2022.  Continue fluid management with dialysis.   GERD with nausea History of EGD LA grade D esophagitis, Mallory-Weiss tear, gastric polyp, and gastritis.  Continue Protonix  40 milligram twice daily, continue Carafate .  No further emesis.  Will advance to full liquids.  DVT prophylaxis: SCDs Start: 07/15/24 0801   Disposition: Home likely in 1 to 2 days when okay with ID  Status is: Inpatient Remains inpatient appropriate because: Pending clinical improvement, IV antibiotic, plan for hemodialysis catheter removal    Code Status:     Code Status: Full Code  Family Communication: Spoke with the patient's aunt at bedside on 07/16/2024  Consultants: Nephrology Infectious disease  Procedures:  Hemodialysis  Anti-infectives:  Cefazolin  IV  Anti-infectives (From  admission, onward)    Start     Dose/Rate Route Frequency Ordered Stop   07/15/24 1815  ceFAZolin  (ANCEF ) IVPB 1 g/50 mL premix        1 g 100 mL/hr over 30 Minutes Intravenous Every 24 hours  07/15/24 1805          Subjective: Today, patient was seen and examined at bedside.  Patient states that he feels better today with pain nausea and vomiting has tolerated clears.  Wishes to have some solid food.  Denies any fever chills shortness of breath or dyspnea.   Objective: Vitals:   07/16/24 1900 07/17/24 0635  BP: (!) 155/99 126/78  Pulse: 99 89  Resp: 16 14  Temp: 99.3 F (37.4 C) 98.1 F (36.7 C)  SpO2: 100% 98%    Intake/Output Summary (Last 24 hours) at 07/17/2024 1006 Last data filed at 07/17/2024 0104 Gross per 24 hour  Intake 360 ml  Output 2025 ml  Net -1665 ml   Filed Weights   07/15/24 0155 07/16/24 1449 07/16/24 1840  Weight: 90.7 kg 93 kg 91 kg   Body mass index is 27.21 kg/m.   Physical Exam:  GENERAL: Patient is alert awake and oriented.  Not in distress.  Legally blind HENT: No scleral pallor or icterus. Pupils equally reactive to light. Oral mucosa is moist, right internal jugular hemodialysis catheter in place NECK: is supple, no gross swelling noted. CHEST:  Diminished breath sounds bilaterally. CVS: S1 and S2 heard, no murmur. Regular rate and rhythm.  ABDOMEN: Soft, non-tender, bowel sounds are present. EXTREMITIES: No edema.  Left upper extremity AV fistula in place with staples. CNS: Legally blind, no focal motor deficits. SKIN: warm and dry without rashes.  skin with pigmentation.  Data Review: I have personally reviewed the following laboratory data and studies,  CBC: Recent Labs  Lab 07/14/24 0530 07/15/24 0218 07/15/24 0910 07/16/24 0427 07/17/24 0446  WBC 8.4 12.4*  --  12.8* 7.4  NEUTROABS 7.4  --   --   --   --   HGB 11.3* 10.9* 9.5* 9.6* 9.6*  HCT 35.4* 33.1* 29.5* 30.1* 29.1*  MCV 87.0 83.2  --  85.0 82.9  PLT 236 216  --  159 182   Basic Metabolic Panel: Recent Labs  Lab 07/14/24 0530 07/15/24 0218 07/16/24 0427 07/17/24 0446  NA 138 135 132* 131*  K 4.1 3.7 4.7 3.5  CL 94* 90* 87* 87*  CO2 25 23 21* 26   GLUCOSE 142* 172* 159* 122*  BUN 49* 66* 83* 50*  CREATININE 12.10* 14.80* 17.40* 12.20*  CALCIUM  7.8* 7.9* 7.1* 7.7*  MG 2.0  --  2.1 2.2   Liver Function Tests: Recent Labs  Lab 07/14/24 0530 07/15/24 0218  AST 16 24  ALT 10 17  ALKPHOS 72 64  BILITOT 0.4 0.4  PROT 8.2* 8.2*  ALBUMIN 4.4 4.3   Recent Labs  Lab 07/15/24 0218  LIPASE 13   No results for input(s): AMMONIA in the last 168 hours. Cardiac Enzymes: Recent Labs  Lab 07/14/24 0530  CKTOTAL 272   BNP (last 3 results) Recent Labs    08/02/23 1120  BNP 3,974.0*    ProBNP (last 3 results) No results for input(s): PROBNP in the last 8760 hours.  CBG: Recent Labs  Lab 07/16/24 1156 07/16/24 1922 07/16/24 2159 07/17/24 0630 07/17/24 0749  GLUCAP 155* 118* 154* 132* 139*   Recent Results (from the past 240 hours)  Resp panel by RT-PCR (  RSV, Flu A&B, Covid) Anterior Nasal Swab     Status: None   Collection Time: 07/14/24  5:28 AM   Specimen: Anterior Nasal Swab  Result Value Ref Range Status   SARS Coronavirus 2 by RT PCR NEGATIVE NEGATIVE Final   Influenza A by PCR NEGATIVE NEGATIVE Final   Influenza B by PCR NEGATIVE NEGATIVE Final    Comment: (NOTE) The Xpert Xpress SARS-CoV-2/FLU/RSV plus assay is intended as an aid in the diagnosis of influenza from Nasopharyngeal swab specimens and should not be used as a sole basis for treatment. Nasal washings and aspirates are unacceptable for Xpert Xpress SARS-CoV-2/FLU/RSV testing.  Fact Sheet for Patients: bloggercourse.com  Fact Sheet for Healthcare Providers: seriousbroker.it  This test is not yet approved or cleared by the [place]  FDA and has been authorized for detection and/or diagnosis of SARS-CoV-2 by FDA under an Emergency Use Authorization (EUA). This EUA will remain in effect (meaning this test can be used) for the duration of the COVID-19 declaration under Section 564(b)(1)  of the Act, 21 U.S.C. section 360bbb-3(b)(1), unless the authorization is terminated or revoked.     Resp Syncytial Virus by PCR NEGATIVE NEGATIVE Final    Comment: (NOTE) Fact Sheet for Patients: bloggercourse.com  Fact Sheet for Healthcare Providers: seriousbroker.it  This test is not yet approved or cleared by the [place]  FDA and has been authorized for detection and/or diagnosis of SARS-CoV-2 by FDA under an Emergency Use Authorization (EUA). This EUA will remain in effect (meaning this test can be used) for the duration of the COVID-19 declaration under Section 564(b)(1) of the Act, 21 U.S.C. section 360bbb-3(b)(1), unless the authorization is terminated or revoked.  Performed at [facility], [address] [place], [place] [address]   Culture, blood (routine x 2)     Status: Abnormal   Collection Time: 07/15/24  3:29 AM   Specimen: BLOOD  Result Value Ref Range Status   Specimen Description BLOOD SITE NOT SPECIFIED  Final   Special Requests   Final    BOTTLES DRAWN AEROBIC AND ANAEROBIC Blood Culture results may not be optimal due to an inadequate volume of blood received in culture bottles   Culture  Setup Time   Final    GRAM POSITIVE COCCI IN CLUSTERS IN BOTH AEROBIC AND ANAEROBIC BOTTLES CRITICAL RESULT CALLED TO, READ BACK BY AND VERIFIED WITH: PHARMD ERIN WILLIAMSON [id number] AT 1759 BY EC Performed at [facility], [address] [place], [place] [address]    Culture STAPHYLOCOCCUS AUREUS (A)  Final   Report Status 07/17/2024 FINAL  Final   Organism ID, Bacteria STAPHYLOCOCCUS AUREUS  Final      Susceptibility   Staphylococcus aureus - MIC*    CIPROFLOXACIN  <=0.5 SENSITIVE Sensitive     ERYTHROMYCIN <=0.25 SENSITIVE Sensitive     GENTAMICIN <=0.5 SENSITIVE Sensitive     OXACILLIN 0.5 SENSITIVE Sensitive     TETRACYCLINE <=1 SENSITIVE Sensitive     VANCOMYCIN  1 SENSITIVE Sensitive      TRIMETH /SULFA  <=10 SENSITIVE Sensitive     CLINDAMYCIN <=0.25 SENSITIVE Sensitive     RIFAMPIN <=0.5 SENSITIVE Sensitive     Inducible Clindamycin NEGATIVE Sensitive     LINEZOLID 2 SENSITIVE Sensitive     * STAPHYLOCOCCUS AUREUS  Blood Culture ID Panel (Reflexed)     Status: Abnormal   Collection Time: 07/15/24  3:29 AM  Result Value Ref Range Status   Enterococcus faecalis NOT DETECTED NOT DETECTED Final  Enterococcus Faecium NOT DETECTED NOT DETECTED Final   Listeria monocytogenes NOT DETECTED NOT DETECTED Final   Staphylococcus species DETECTED (A) NOT DETECTED Final    Comment: CRITICAL RESULT CALLED TO, READ BACK BY AND VERIFIED WITH: PHARMD ERIN WILLIAMS ON [id number] AT 1759 BY EC    Staphylococcus aureus (BCID) DETECTED (A) NOT DETECTED Final    Comment: CRITICAL RESULT CALLED TO, READ BACK BY AND VERIFIED WITH: PHARMD ROCKY SLADE [id number] AT 1759 BY EC    Staphylococcus epidermidis NOT DETECTED NOT DETECTED Final   Staphylococcus lugdunensis NOT DETECTED NOT DETECTED Final   Streptococcus species NOT DETECTED NOT DETECTED Final   Streptococcus agalactiae NOT DETECTED NOT DETECTED Final   Streptococcus pneumoniae NOT DETECTED NOT DETECTED Final   Streptococcus pyogenes NOT DETECTED NOT DETECTED Final   A.calcoaceticus-baumannii NOT DETECTED NOT DETECTED Final   Bacteroides fragilis NOT DETECTED NOT DETECTED Final   Enterobacterales NOT DETECTED NOT DETECTED Final   Enterobacter cloacae complex NOT DETECTED NOT DETECTED Final   Escherichia coli NOT DETECTED NOT DETECTED Final   Klebsiella aerogenes NOT DETECTED NOT DETECTED Final   Klebsiella oxytoca NOT DETECTED NOT DETECTED Final   Klebsiella pneumoniae NOT DETECTED NOT DETECTED Final   Proteus species NOT DETECTED NOT DETECTED Final   Salmonella species NOT DETECTED NOT DETECTED Final   Serratia marcescens NOT DETECTED NOT DETECTED Final   Haemophilus influenzae NOT DETECTED NOT DETECTED Final   Neisseria  meningitidis NOT DETECTED NOT DETECTED Final   Pseudomonas aeruginosa NOT DETECTED NOT DETECTED Final   Stenotrophomonas maltophilia NOT DETECTED NOT DETECTED Final   Candida albicans NOT DETECTED NOT DETECTED Final   Candida auris NOT DETECTED NOT DETECTED Final   Candida glabrata NOT DETECTED NOT DETECTED Final   Candida krusei NOT DETECTED NOT DETECTED Final   Candida parapsilosis NOT DETECTED NOT DETECTED Final   Candida tropicalis NOT DETECTED NOT DETECTED Final   Cryptococcus neoformans/gattii NOT DETECTED NOT DETECTED Final   Meth resistant mecA/C and MREJ NOT DETECTED NOT DETECTED Final    Comment: Performed at [facility], [address] [place], [place] [address]  Respiratory (~20 pathogens) panel by PCR     Status: None   Collection Time: 07/15/24  8:03 AM   Specimen: Nasopharyngeal Swab; Respiratory  Result Value Ref Range Status   Adenovirus NOT DETECTED NOT DETECTED Final   Coronavirus 229E NOT DETECTED NOT DETECTED Final    Comment: (NOTE) The Coronavirus on the Respiratory Panel, DOES NOT test for the novel  Coronavirus (2019 nCoV)    Coronavirus HKU1 NOT DETECTED NOT DETECTED Final   Coronavirus NL63 NOT DETECTED NOT DETECTED Final   Coronavirus OC43 NOT DETECTED NOT DETECTED Final   Metapneumovirus NOT DETECTED NOT DETECTED Final   Rhinovirus / Enterovirus NOT DETECTED NOT DETECTED Final   Influenza A NOT DETECTED NOT DETECTED Final   Influenza B NOT DETECTED NOT DETECTED Final   Parainfluenza Virus 1 NOT DETECTED NOT DETECTED Final   Parainfluenza Virus 2 NOT DETECTED NOT DETECTED Final   Parainfluenza Virus 3 NOT DETECTED NOT DETECTED Final   Parainfluenza Virus 4 NOT DETECTED NOT DETECTED Final   Respiratory Syncytial Virus NOT DETECTED NOT DETECTED Final   Bordetella pertussis NOT DETECTED NOT DETECTED Final   Bordetella Parapertussis NOT DETECTED NOT DETECTED Final   Chlamydophila pneumoniae NOT DETECTED NOT DETECTED Final   Mycoplasma pneumoniae  NOT DETECTED NOT DETECTED Final    Comment: Performed at [facility], [address], [place],  [place] [address]  Culture, blood (routine x 2)     Status: Abnormal (Preliminary result)   Collection Time: 07/15/24  3:14 PM   Specimen: BLOOD RIGHT HAND  Result Value Ref Range Status   Specimen Description BLOOD RIGHT HAND  Final   Special Requests   Final    BOTTLES DRAWN AEROBIC AND ANAEROBIC Blood Culture results may not be optimal due to an inadequate volume of blood received in culture bottles   Culture  Setup Time   Final    GRAM POSITIVE COCCI IN BOTH AEROBIC AND ANAEROBIC BOTTLES CRITICAL VALUE NOTED.  VALUE IS CONSISTENT WITH PREVIOUSLY REPORTED AND CALLED VALUE.    Culture (A)  Final    STAPHYLOCOCCUS AUREUS SUSCEPTIBILITIES PERFORMED ON PREVIOUS CULTURE WITHIN THE LAST 5 DAYS. Performed at [facility], [address], [place], [place] [address]    Report Status PENDING  Incomplete     Studies: No results found.     Vernal Alstrom, MD  [facility] 07/17/2024  If 7PM-7AM, please contact night-coverage             "

## 2024-07-17 NOTE — Procedures (Signed)
 [facility] Catheter Removal Procedure Note   Diagnosis: ESRD with bacteremia   Plan:  Remove right diatek catheter   Consent signed:  Yes.   Time out completed:  Yes.   Coumadin:  No. PT/INR (if applicable):   Other labs:     Procedure: 1.  Sterile prepping and draping over catheter area 2. 5 ml 2% Xylocaine  instilled at removal site. 3.  right catheter removed in its entirety with cuff in tact. 4.  Complications: none  5. Tip of catheter sent for culture:  No.     Patient tolerated procedure well:  Yes.   Pressure held, no bleeding noted, dressing applied Instructions given to the pt regarding wound care and bleeding.   Other:   Naeema Patlan, PA-C  [facility] Catheter Removal Instructions   Please sit up at least 30 degrees for 4 hours then may lay flat   MAY REMOVE DRESSINGS IN AM AND MAY SHOWER   REPLACE DRESSING OVER EXIT SITE AS NEEDED   IF YOU SHOULD NOTE BLEEDING OR SWELLING AT THE NECK HOLD PRESSURE OVER THE DRESSINGS FOR 15 MINUTES, IF BLEEDING PERSISTS COME TO ER OR CALL 911

## 2024-07-17 NOTE — Consult Note (Addendum)
 " Hospital Consult    Reason for Consult:  line holiday Requesting Physician:  Lamarr Broadus RIGGERS MRN #:  [id number]  History of Present Illness: [name] is a 39 y.o. male with a PMH of DMII, HTN, HCM, seizures, and ESRD on HD who was admitted to [facility] 2 days ago with intractable nausea and vomiting.  He was found to have Staph aureus bacteremia and we were requested for line holiday.  The patient is resting comfortably on my exam.  He says on Saturday he had severe abdominal pain after eating a burger and then had several emesis events afterwards.  His abdominal pain has gotten much better after admission to the hospital and receiving antibiotics.  He is currently on a liquid diet and tolerating this well.  Of note, the patient had a right IJ TDC placed by Dr. Pearline on 04/25/2024.  He then had ligation and excision of a thrombosed left arm radiocephalic AV fistula with creation of a left brachiocephalic AV fistula on 06/22/2024 by Dr. Sheree.  Prior to this admission, he has been dialyzing through his [facility].  Past Medical History:  Diagnosis Date   ADHD (attention deficit hyperactivity disorder)    Diabetes mellitus    ESRD on hemodialysis (HCC)    [facility]   High cholesterol    Hypertension    Hypertrophic cardiomyopathy (HCC) 02/26/2021   Seizure (HCC) 02/08/2021    Past Surgical History:  Procedure Laterality Date   A/V SHUNT INTERVENTION N/A 12/21/2023   Procedure: A/V SHUNT INTERVENTION;  Surgeon: Pearline Norman RAMAN, MD;  Location: [facility];  Service: Cardiovascular;  Laterality: N/A;   AMPUTATION TOE Right 12/29/2021   Procedure: AMPUTATION TOE, fifth;  Surgeon: Joya Stabs, DPM;  Location: [facility] OR;  Service: Podiatry;  Laterality: Right;  surgical team will do local block   AV FISTULA PLACEMENT Left 08/11/2020   Procedure: LEFT UPPER EXTREMITY ARTERIOVENOUS (AV) FISTULA CREATION;  Surgeon: Magda Debby SAILOR, MD;  Location: [facility] OR;  Service: Vascular;   Laterality: Left;   AV FISTULA PLACEMENT Left 06/22/2024   Procedure: brachiocephalic FISTULA CREATION;  Surgeon: Sheree Penne Bruckner, MD;  Location: [facility] OR;  Service: Vascular;  Laterality: Left;   BIOPSY  08/04/2023   Procedure: BIOPSY;  Surgeon: Cinderella Deatrice FALCON, MD;  Location: [facility];  Service: Endoscopy;;   DIALYSIS/PERMA CATHETER INSERTION Right 04/25/2024   Procedure: DIALYSIS/PERMA CATHETER INSERTION;  Surgeon: Pearline Norman RAMAN, MD;  Location: [facility];  Service: Cardiovascular;  Laterality: Right;   ESOPHAGOGASTRODUODENOSCOPY (EGD) WITH PROPOFOL  N/A 08/04/2023   Procedure: ESOPHAGOGASTRODUODENOSCOPY (EGD) WITH PROPOFOL ;  Surgeon: Cinderella Deatrice FALCON, MD;  [facility];  Service: Endoscopy;  Laterality: N/A;   FISTULA SUPERFICIALIZATION Left 12/02/2021   Procedure: PLICATION OF LEFT RADIUS CEPHALIC FISTULA;  Surgeon: Magda Debby SAILOR, MD;  Location: [facility] OR;  Service: Vascular;  Laterality: Left;  PERIPHERAL NERVE BLOCK   FRACTURE SURGERY     I & D EXTREMITY Left 07/16/2014   Procedure: IRRIGATION AND DEBRIDEMENT EXTREMITY/LEFT INDEX FINGER;  Surgeon: Franky Curia, MD;  Location: [facility] OR;  Service: Orthopedics;  Laterality: Left;   IR FLUORO GUIDE CV LINE RIGHT  10/04/2022   IR FLUORO GUIDE CV LINE RIGHT  10/05/2022   IR PERC TUN PERIT CATH WO PORT S&I /IMAG  08/07/2020   IR REMOVAL TUN CV CATH W/O FL  10/27/2022   IR THROMBECTOMY AV FISTULA W/THROMBOLYSIS/PTA INC/SHUNT/IMG LEFT Left 10/05/2022   IR US  GUIDE VASC ACCESS RIGHT  08/07/2020   IR US  GUIDE VASC ACCESS RIGHT  10/04/2022   IR US  GUIDE VASC ACCESS RIGHT  10/05/2022   REVISON OF ARTERIOVENOUS FISTULA Left 06/22/2024   Procedure: Ligation and Excision of cephalic Fistula;  Surgeon: Sheree Penne Bruckner, MD;  Location: [facility] OR;  Service: Vascular;  Laterality: Left;   TEE WITHOUT CARDIOVERSION N/A 10/28/2022   Procedure: TRANSESOPHAGEAL ECHOCARDIOGRAM;  Surgeon: Sheena Pugh, DO;  Location: [facility];  Service:  Cardiovascular;  Laterality: N/A;   VENOUS ANGIOPLASTY Left 12/21/2023   Procedure: VENOUS ANGIOPLASTY;  Surgeon: Pearline Norman RAMAN, MD;  Location: [facility];  Service: Cardiovascular;  Laterality: Left;    Allergies[1]  Prior to Admission medications  Medication Sig Start Date End Date Taking? Authorizing Provider  atorvastatin  (LIPITOR) 40 MG tablet Take 1 tablet (40 mg total) by mouth daily. Patient taking differently: Take 40 mg by mouth daily after supper. 01/18/23 07/15/24 Yes Melvenia Motto, MD  cloNIDine  (CATAPRES ) 0.2 MG tablet Take 0.2 mg by mouth daily. 06/25/24  Yes [provider]  diphenhydramine -acetaminophen  (TYLENOL  PM) 25-500 MG TABS tablet Take 2 tablets by mouth 2 (two) times daily as needed (pain, sleep).   Yes [provider]  insulin  glargine-yfgn (SEMGLEE ) 100 UNIT/ML injection Inject 0.12 mLs (12 Units total) into the skin at bedtime. Patient taking differently: Inject 6-7 Units into the skin at bedtime. 11/07/23  Yes Massey, Amy J, NP  insulin  lispro (HUMALOG ) 100 UNIT/ML KwikPen INJECT PER SLIDING SCALE 3 TIMES DAILY WITH MEALS IF GLUCOSE IS 151-200=1 UNIT,201-250=2U, 251-300=3U,301-350=5U, 351-400=7U. CALL MD IF >400 06/18/24  Yes Massey, Amy J, NP  labetalol  (NORMODYNE ) 200 MG tablet Take 1 tablet (200 mg total) by mouth 2 (two) times daily. Patient taking differently: Take 200 mg by mouth every evening. 06/18/24 09/16/24 Yes Massey, Amy J, NP  LOKELMA  5 g packet Take 5 g by mouth See admin instructions. Take 5 g by mouth every day not going to dialysis 05/29/24  Yes [provider]  metoCLOPramide  (REGLAN ) 10 MG tablet Take 10 mg by mouth every 6 (six) hours as needed for vomiting or nausea. 06/25/24  Yes [provider]  minoxidil  (LONITEN ) 2.5 MG tablet Take 2.5 mg by mouth 2 (two) times daily. 10/24/23  Yes [provider]  Multiple Vitamins-Minerals (MULTIVITAMIN WITH MINERALS) tablet Take 1 tablet by mouth every other day.   Yes  [provider]  ondansetron  (ZOFRAN -ODT) 4 MG disintegrating tablet Take 1 tablet (4 mg total) by mouth every 8 (eight) hours as needed for nausea or vomiting. 07/14/24  Yes Tegeler, Bruckner PARAS, MD  oxyCODONE -acetaminophen  (PERCOCET) 5-325 MG tablet Take 1 tablet by mouth every 4 (four) hours as needed for severe pain (pain score 7-10). 06/22/24 06/22/25 Yes Baglia, Corrina, PA-C  pantoprazole  (PROTONIX ) 40 MG tablet TAKE 1 TABLET (40 MG TOTAL) BY MOUTH 2 (TWO) TIMES DAILY. 11/03/23 11/02/24 Yes Shirlean Therisa ORN, NP  sucralfate  (CARAFATE ) 1 GM/10ML suspension TAKE 10 MLS (1 G TOTAL) BY MOUTH 4 (FOUR) TIMES DAILY. Patient not taking: Reported on 07/15/2024 02/13/24   Shirlean Therisa ORN, NP  SURE COMFORT PEN NEEDLES 31G X 8 MM MISC USE AS DIRECTED WITH INSULIN  PENS FOR INJECTION 02/13/24   Jaycee Greig PARAS, NP    Social History   Socioeconomic History   Marital status: Single    Spouse name: Not on file   Number of children: Not on file   Years of education: Not on file   Highest education level: Not on  file  Occupational History   Occupation: disabled  Tobacco Use   Smoking status: Never    Passive exposure: Never   Smokeless tobacco: Never  Vaping Use   Vaping status: Never Used  Substance and Sexual Activity   Alcohol use: Not Currently    Comment: social drinker   Drug use: No   Sexual activity: Yes  Other Topics Concern   Not on file  Social History Narrative   Not on file   Social Drivers of Health   Tobacco Use: Low Risk (07/14/2024)   Patient History    Smoking Tobacco Use: Never    Smokeless Tobacco Use: Never    Passive Exposure: Never  Financial Resource Strain: Low Risk (11/07/2023)   Overall Financial Resource Strain (CARDIA)    Difficulty of Paying Living Expenses: Not hard at all  Food Insecurity: No Food Insecurity (07/16/2024)   Epic    Worried About Radiation Protection Practitioner of Food in the Last Year: Never true    Ran Out of Food in the Last Year: Never true  Transportation  Needs: No Transportation Needs (07/16/2024)   Epic    Lack of Transportation (Medical): No    Lack of Transportation (Non-Medical): No  Physical Activity: Inactive (11/07/2023)   Exercise Vital Sign    Days of Exercise per Week: 0 days    Minutes of Exercise per Session: 0 min  Stress: No Stress Concern Present (11/07/2023)   [employer] of Occupational Health - Occupational Stress Questionnaire    Feeling of Stress : Only a little  Social Connections: Socially Isolated (11/10/2023)   Social Connection and Isolation Panel    Frequency of Communication with Friends and Family: More than three times a week    Frequency of Social Gatherings with Friends and Family: More than three times a week    Attends Religious Services: Never    Database Administrator or Organizations: No    Attends Banker Meetings: Never    Marital Status: Never married  Intimate Partner Violence: Not At Risk (07/16/2024)   Epic    Fear of Current or Ex-Partner: No    Emotionally Abused: No    Physically Abused: No    Sexually Abused: No  Depression (PHQ2-9): Low Risk (06/18/2024)   Depression (PHQ2-9)    PHQ-2 Score: 1  Alcohol Screen: Low Risk (11/07/2023)   Alcohol Screen    Last Alcohol Screening Score (AUDIT): 0  Housing: Unknown (07/16/2024)   Epic    Unable to Pay for Housing in the Last Year: No    Number of Times Moved in the Last Year: Not on file    Homeless in the Last Year: No  Utilities: Not At Risk (07/16/2024)   Epic    Threatened with loss of utilities: No  Health Literacy: Adequate Health Literacy (11/07/2023)   B1300 Health Literacy    Frequency of need for help with medical instructions: Never    Family History  Problem Relation Age of Onset   Cancer Mother    Stroke Father    Diabetes Father    Hypertension Father     ROS: Otherwise negative unless mentioned in HPI  Physical Examination  Vitals:   07/17/24 1006 07/17/24 1041  BP: 137/82 137/82  Pulse: 94 94   Resp: 16   Temp: 99.5 F (37.5 C)   SpO2: 100%    Body mass index is 27.21 kg/m.  General:  WDWN in NAD Gait: Not observed HENT: WNL, normocephalic  Pulmonary: normal non-labored breathing Cardiac: Regular Abdomen:  soft, NT/ND, no masses Skin: without rashes Vascular Exam/Pulses: Palpable left radial pulse Extremities: Left forearm incisions well-healed, all staples removed.  Left brachiocephalic fistula with good thrill near the [facility] fossa, becomes slightly pulsatile moving up the arm Musculoskeletal: no muscle wasting or atrophy  Neurologic: A&O X 3;  No focal weakness or paresthesias are detected; speech is fluent/normal Psychiatric:  The pt has Normal affect. Lymph:  Unremarkable  CBC    Component Value Date/Time   WBC 7.4 07/17/2024 0446   RBC 3.51 (L) 07/17/2024 0446   HGB 9.6 (L) 07/17/2024 0446   HGB 8.1 (L) 09/16/2020 1356   HCT 29.1 (L) 07/17/2024 0446   HCT 26.5 (L) 09/16/2020 1356   PLT 182 07/17/2024 0446   PLT 272 09/16/2020 1356   MCV 82.9 07/17/2024 0446   MCV 86 09/16/2020 1356   MCH 27.4 07/17/2024 0446   MCHC 33.0 07/17/2024 0446   RDW 16.4 (H) 07/17/2024 0446   RDW 15.3 09/16/2020 1356   LYMPHSABS 0.3 (L) 07/14/2024 0530   MONOABS 0.6 07/14/2024 0530   EOSABS 0.0 07/14/2024 0530   BASOSABS 0.0 07/14/2024 0530    BMET    Component Value Date/Time   NA 131 (L) 07/17/2024 0446   NA 143 09/16/2020 1356   K 3.5 07/17/2024 0446   CL 87 (L) 07/17/2024 0446   CO2 26 07/17/2024 0446   GLUCOSE 122 (H) 07/17/2024 0446   BUN 50 (H) 07/17/2024 0446   BUN 40 (H) 09/16/2020 1356   CREATININE 12.20 (H) 07/17/2024 0446   CALCIUM  7.7 (L) 07/17/2024 0446   CALCIUM  7.2 (L) 08/08/2020 0534   GFRNONAA 5 (L) 07/17/2024 0446   GFRAA 19 (L) 03/10/2020 1410    COAGS: Lab Results  Component Value Date   INR 1.2 10/24/2022   INR 1.1 08/11/2020   INR 1.1 05/09/2020     Non-Invasive Vascular Imaging:   Dialysis Duplex Ordered   ASSESSMENT/PLAN: This  is a 39 y.o. male admitted with bacteremia   - The patient was recently admitted to [facility] with intractable nausea and vomiting and was found to have Staph aureus bacteremia.  We were consulted for line holiday. - The patient recently underwent ligation and excision of a thrombosed left radiocephalic AV fistula and creation of a left brachiocephalic AV fistula in December.  During the interim, he has been dialyzing through his right IJ [facility] -The patient is resting comfortably on my exam.  There are no obvious signs of infection at his right IJ [facility] site.  I numbed up the area surrounding his TDC cuff and dissected this out for removal.  I then removed his TDC and held pressure until hemostatic.  I also removed all of the staples in his left forearm.  All of his incisions are well-healed. - He has a new left brachiocephalic fistula has a good thrill near the [facility] fossa but becomes slightly pulsatile up the arm.  This is likely not ready for dialysis access yet.  I ordered a duplex to assess maturity. - After the patient receives a line holiday, we can plan to place a new TDC in the OR later this week.  He will need this for dialysis access until his fistula is mature.   Ahmed Holster PA-C [facility] [phone]   VASCULAR STAFF ADDENDUM: I have independently interviewed and examined the patient. I agree with the above.  Will tentatively plan for replacement of right IJ  TDC on Thursday as long as infectious disease is okay with replacement by then.  Norman GORMAN Serve MD [facility] Phone Number: [phone] 07/17/2024 2:34 PM     [1]  Allergies Allergen Reactions   Apresoline  [Hydralazine ] Nausea And Vomiting and Other (See Comments)    Patient was taking Hydralazine  AND Amlodipine  at the same time, so the reactions came from one of the 2: Lethargy and an all-over feeling of NOT feeling well   Norvasc  [Amlodipine ] Nausea And  Vomiting and Other (See Comments)    Patient was taking Amlodipine  AND Hydralazine  at the same time, so the reactions came from one of the 2: Lethargy and an all-over feeling of NOT feeling well    "

## 2024-07-17 NOTE — Progress Notes (Signed)
 Will consult [facility] for [facility] removal. S/p AVF last month 12/12 with Dr. Sheree - its fairly early, but will let them decide if this is useable moving forward. If not, then will need a new TDC in few days after his bacteremia is cleared.  Izetta Boehringer, PA-C [employer] Pager [phone]

## 2024-07-17 NOTE — Progress Notes (Signed)
 " [facility] Progress Note   Subjective:  Seen in room this AM - abd pain better, s/p full HD yesterday with 2L off. [facility] was consulted for [facility] removal, this has now been done. Will be having US  to new LUE AVF, but it is not ready to be used.  Objective Vitals:   07/16/24 1900 07/17/24 0635 07/17/24 1006 07/17/24 1041  BP: (!) 155/99 126/78 137/82 137/82  Pulse: 99 89 94 94  Resp: 16 14 16    Temp: 99.3 F (37.4 C) 98.1 F (36.7 C) 99.5 F (37.5 C)   TempSrc: Oral Oral Oral   SpO2: 100% 98% 100%   Weight:      Height:       Physical Exam General: Well appearing, NAD. Room air Heart: RRR; no murmur Lungs: CTAB; no rales Abdomen: soft Extremities: no LE edema Dialysis Access: TDC now removed. LUE AVF +b/t (deep in prox arm)  Additional Objective Labs: Basic Metabolic Panel: Recent Labs  Lab 07/15/24 0218 07/16/24 0427 07/17/24 0446  NA 135 132* 131*  K 3.7 4.7 3.5  CL 90* 87* 87*  CO2 23 21* 26  GLUCOSE 172* 159* 122*  BUN 66* 83* 50*  CREATININE 14.80* 17.40* 12.20*  CALCIUM  7.9* 7.1* 7.7*   Liver Function Tests: Recent Labs  Lab 07/14/24 0530 07/15/24 0218  AST 16 24  ALT 10 17  ALKPHOS 72 64  BILITOT 0.4 0.4  PROT 8.2* 8.2*  ALBUMIN 4.4 4.3   Recent Labs  Lab 07/15/24 0218  LIPASE 13   CBC: Recent Labs  Lab 07/14/24 0530 07/15/24 0218 07/15/24 0910 07/16/24 0427 07/17/24 0446  WBC 8.4 12.4*  --  12.8* 7.4  NEUTROABS 7.4  --   --   --   --   HGB 11.3* 10.9* 9.5* 9.6* 9.6*  HCT 35.4* 33.1* 29.5* 30.1* 29.1*  MCV 87.0 83.2  --  85.0 82.9  PLT 236 216  --  159 182   Blood Culture    Component Value Date/Time   SDES BLOOD RIGHT HAND 07/15/2024 1514   SPECREQUEST  07/15/2024 1514    BOTTLES DRAWN AEROBIC AND ANAEROBIC Blood Culture results may not be optimal due to an inadequate volume of blood received in culture bottles   CULT (A) 07/15/2024 1514    STAPHYLOCOCCUS AUREUS SUSCEPTIBILITIES PERFORMED ON PREVIOUS CULTURE  WITHIN THE LAST 5 DAYS. Performed at [facility], [address] [place], [place] [address]    REPTSTATUS PENDING 07/15/2024 1514   Medications:   ceFAZolin  (ANCEF ) IV Stopped (07/17/24 0753)    atorvastatin   40 mg Oral QPC supper   Chlorhexidine  Gluconate Cloth  6 each Topical Q0600   cloNIDine   0.1 mg Oral BID   insulin  aspart  0-6 Units Subcutaneous TID WC   insulin  glargine-yfgn  7 Units Subcutaneous QHS   labetalol   200 mg Oral BID   metoCLOPramide  (REGLAN ) injection  10 mg Intravenous Q6H   minoxidil   2.5 mg Oral BID   pantoprazole  (PROTONIX ) IV  40 mg Intravenous Q12H   scopolamine   1 patch Transdermal Q72H   sucralfate   1 g Oral QID   Dialysis Orders TTS - [facility] 4hr, 400/500, EDW 92kg, 1K/2.5Ca bath, TDC, heparin  1000 + 1000/hr - Mircera 30mcg IV q 2 weeks - no VDRA   Assessment/Plan: Staph aureus bacteremia: Blood Cx 1/4 positive, on Cefazolin . WBC high. TDC removed this AM. Abd pain with N/V: ?Related to #1, CTA without mesenteric ischemia. On PPI +  sucralfate . Pain meds and anti-emetics per primary team. ESRD: Usual TTS schedule, s/p HD 1/5 - now line holiday. Next HD likely Thursday 1/8. He has a new LUE AVF, not matured enough to use, will need new TDC prior to next HD. 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 Secondary HPTH: Ca low end - follow. T2DM: Per primary.   Izetta Boehringer, PA-C 07/17/2024, 1:05 PM  [facility]    "

## 2024-07-17 NOTE — Plan of Care (Signed)

## 2024-07-18 ENCOUNTER — Encounter

## 2024-07-18 ENCOUNTER — Inpatient Hospital Stay (HOSPITAL_COMMUNITY)

## 2024-07-18 ENCOUNTER — Encounter (HOSPITAL_COMMUNITY): Payer: Self-pay | Admitting: Internal Medicine

## 2024-07-18 DIAGNOSIS — R7881 Bacteremia: Secondary | ICD-10-CM | POA: Diagnosis not present

## 2024-07-18 DIAGNOSIS — B9561 Methicillin susceptible Staphylococcus aureus infection as the cause of diseases classified elsewhere: Secondary | ICD-10-CM | POA: Diagnosis not present

## 2024-07-18 DIAGNOSIS — R112 Nausea with vomiting, unspecified: Secondary | ICD-10-CM | POA: Diagnosis not present

## 2024-07-18 LAB — ECHOCARDIOGRAM COMPLETE BUBBLE STUDY
AR max vel: 3.03 cm2
AV Area VTI: 3.4 cm2
AV Area mean vel: 2.92 cm2
AV Mean grad: 4 mmHg
AV Peak grad: 6.6 mmHg
Ao pk vel: 1.28 m/s
Area-P 1/2: 4.86 cm2
Calc EF: 64.9 %
MV VTI: 3 cm2
S' Lateral: 3.1 cm
Single Plane A2C EF: 62.5 %
Single Plane A4C EF: 67.6 %

## 2024-07-18 LAB — GLUCOSE, CAPILLARY
Glucose-Capillary: 121 mg/dL — ABNORMAL HIGH (ref 70–99)
Glucose-Capillary: 183 mg/dL — ABNORMAL HIGH (ref 70–99)
Glucose-Capillary: 94 mg/dL (ref 70–99)
Glucose-Capillary: 188 mg/dL — ABNORMAL HIGH (ref 70–99)

## 2024-07-18 LAB — BASIC METABOLIC PANEL WITH GFR
Anion gap: 18 — ABNORMAL HIGH (ref 5–15)
BUN: 59 mg/dL — ABNORMAL HIGH (ref 6–20)
CO2: 24 mmol/L (ref 22–32)
Calcium: 7.2 mg/dL — ABNORMAL LOW (ref 8.9–10.3)
Chloride: 87 mmol/L — ABNORMAL LOW (ref 98–111)
Creatinine, Ser: 14.5 mg/dL — ABNORMAL HIGH (ref 0.61–1.24)
GFR, Estimated: 4 mL/min — ABNORMAL LOW
Glucose, Bld: 130 mg/dL — ABNORMAL HIGH (ref 70–99)
Potassium: 3.6 mmol/L (ref 3.5–5.1)
Sodium: 129 mmol/L — ABNORMAL LOW (ref 135–145)

## 2024-07-18 LAB — CULTURE, BLOOD (ROUTINE X 2)

## 2024-07-18 LAB — CBC
HCT: 28.9 % — ABNORMAL LOW (ref 39.0–52.0)
Hemoglobin: 9.4 g/dL — ABNORMAL LOW (ref 13.0–17.0)
MCH: 27.2 pg (ref 26.0–34.0)
MCHC: 32.5 g/dL (ref 30.0–36.0)
MCV: 83.8 fL (ref 80.0–100.0)
Platelets: 166 K/uL (ref 150–400)
RBC: 3.45 MIL/uL — ABNORMAL LOW (ref 4.22–5.81)
RDW: 16.2 % — ABNORMAL HIGH (ref 11.5–15.5)
WBC: 6.4 K/uL (ref 4.0–10.5)
nRBC: 0 % (ref 0.0–0.2)

## 2024-07-18 LAB — MAGNESIUM: Magnesium: 2.2 mg/dL (ref 1.7–2.4)

## 2024-07-18 MED ORDER — METOCLOPRAMIDE HCL 5 MG PO TABS
5.0000 mg | ORAL_TABLET | Freq: Four times a day (QID) | ORAL | Status: DC
  Administered 2024-07-18 – 2024-07-19 (×4): 5 mg via ORAL
  Filled 2024-07-18 (×4): qty 1

## 2024-07-18 MED ORDER — CHLORHEXIDINE GLUCONATE CLOTH 2 % EX PADS
6.0000 | MEDICATED_PAD | Freq: Every day | CUTANEOUS | Status: DC
  Administered 2024-07-19 – 2024-07-20 (×2): 6 via TOPICAL

## 2024-07-18 NOTE — Progress Notes (Signed)
 " [facility] Progress Note   Subjective:    Seen and examined patient at bedside. He reports feeling fine and denies SOB, CP, and N/V. S/p TDC removal yesterday for line holiday. Per VVS, plan for new [facility] placement tomorrow pending ID recs. Current AVF not ready to use until March.  Objective Vitals:   07/17/24 1041 07/17/24 1809 07/17/24 2206 07/18/24 0445  BP: 137/82 116/61 (!) 112/59 116/67  Pulse: 94 91 88 74  Resp:  16 17   Temp:  99.3 F (37.4 C) (!) 100.7 F (38.2 C) 98.5 F (36.9 C)  TempSrc:  Oral    SpO2:  100% 96% 99%  Weight:      Height:       Physical Exam General: Well appearing, NAD. Room air Heart: RRR; no murmur Lungs: CTAB; no rales Abdomen: soft Extremities: no LE edema Dialysis Access: TDC now removed 1/6. LUE AVF +b/t (deep in prox arm)  Filed Weights   07/15/24 0155 07/16/24 1449 07/16/24 1840  Weight: 90.7 kg 93 kg 91 kg    Intake/Output Summary (Last 24 hours) at 07/18/2024 0956 Last data filed at 07/17/2024 1700 Gross per 24 hour  Intake 340 ml  Output --  Net 340 ml    Additional Objective Labs: Basic Metabolic Panel: Recent Labs  Lab 07/16/24 0427 07/17/24 0446 07/18/24 0429  NA 132* 131* 129*  K 4.7 3.5 3.6  CL 87* 87* 87*  CO2 21* 26 24  GLUCOSE 159* 122* 130*  BUN 83* 50* 59*  CREATININE 17.40* 12.20* 14.50*  CALCIUM  7.1* 7.7* 7.2*   Liver Function Tests: Recent Labs  Lab 07/14/24 0530 07/15/24 0218  AST 16 24  ALT 10 17  ALKPHOS 72 64  BILITOT 0.4 0.4  PROT 8.2* 8.2*  ALBUMIN 4.4 4.3   Recent Labs  Lab 07/15/24 0218  LIPASE 13   CBC: Recent Labs  Lab 07/14/24 0530 07/15/24 0218 07/15/24 0910 07/16/24 0427 07/17/24 0446 07/18/24 0429  WBC 8.4 12.4*  --  12.8* 7.4 6.4  NEUTROABS 7.4  --   --   --   --   --   HGB 11.3* 10.9*   < > 9.6* 9.6* 9.4*  HCT 35.4* 33.1*   < > 30.1* 29.1* 28.9*  MCV 87.0 83.2  --  85.0 82.9 83.8  PLT 236 216  --  159 182 166   < > = values in this interval not  displayed.   Blood Culture    Component Value Date/Time   SDES BLOOD RIGHT HAND 07/15/2024 1514   SPECREQUEST  07/15/2024 1514    BOTTLES DRAWN AEROBIC AND ANAEROBIC Blood Culture results may not be optimal due to an inadequate volume of blood received in culture bottles   CULT (A) 07/15/2024 1514    STAPHYLOCOCCUS AUREUS SUSCEPTIBILITIES PERFORMED ON PREVIOUS CULTURE WITHIN THE LAST 5 DAYS. Performed at [facility], [address] [place], [place] [address]    REPTSTATUS 07/18/2024 FINAL 07/15/2024 1514    Cardiac Enzymes: Recent Labs  Lab 07/14/24 0530  CKTOTAL 272   CBG: Recent Labs  Lab 07/17/24 0749 07/17/24 1318 07/17/24 1652 07/17/24 2204 07/18/24 0745  GLUCAP 139* 177* 162* 121* 121*   Iron Studies: No results for input(s): IRON, TIBC, TRANSFERRIN, FERRITIN in the last 72 hours. Lab Results  Component Value Date   INR 1.2 10/24/2022   INR 1.1 08/11/2020   INR 1.1 05/09/2020   Studies/Results: VAS US  DUPLEX DIALYSIS ACCESS (AVF, AVG)  Result Date: 07/18/2024 DIALYSIS ACCESS Patient Name:  [name]  Date of Exam:   07/17/2024 Medical Rec #: [id number]        Accession #:    [id number] Date of Birth: 08-Mar-1986        Patient Gender: M Patient Age:   39 years Exam Location:  [facility] Procedure:      VAS US  DUPLEX DIALYSIS ACCESS (AVF, AVG) Referring Phys: [facility] --------------------------------------------------------------------------------  Reason for Exam: Routine follow up. Access Site: Left Upper Extremity. Access Type: Brachial-cephalic AVF. History: Brachiocephalic fistula creation 06/22/24          12/02/21 Plication left radius cephalic fistula          07/24/20 - Left upper Ext AVF Creation. Performing Technologist: Ricka Sturdivant-Jones RDMS, RVT  Examination Guidelines: A complete evaluation includes B-mode imaging, spectral Doppler, color Doppler, and power Doppler as needed of all accessible portions of each vessel.  Unilateral testing is considered an integral part of a complete examination. Limited examinations for reoccurring indications may be performed as noted.  Findings: +--------------------+----------+-----------------+--------+ AVF                 PSV (cm/s)Flow Vol (mL/min)Comments +--------------------+----------+-----------------+--------+ Native artery inflow   131           305                +--------------------+----------+-----------------+--------+ AVF Anastomosis        583                              +--------------------+----------+-----------------+--------+  +------------+----------+-------------+----------+----------------------------+ OUTFLOW VEINPSV (cm/s)Diameter (cm)Depth (cm)          Describe           +------------+----------+-------------+----------+----------------------------+ Shoulder        84        0.44        0.53                                +------------+----------+-------------+----------+----------------------------+ Prox UA         64        0.60        0.44         competing branch       +------------+----------+-------------+----------+----------------------------+ Mid UA          94        0.58        0.20   a large branch with thrombus +------------+----------+-------------+----------+----------------------------+ Dist UA        277        0.88        0.16                                +------------+----------+-------------+----------+----------------------------+ AC Fossa       408        0.39        0.50                                +------------+----------+-------------+----------+----------------------------+   Summary: Patent arteriovenous fistula with less than 100 cm/s noted in the proximal and mid segments. Low flow volume.  *See table(s) above for measurements and observations.  Diagnosing physician: Debby Robertson Electronically signed by Debby  Hawken on 07/18/2024 at 7:47:59 AM.    --------------------------------------------------------------------------------   Final     Medications:   ceFAZolin  (ANCEF ) IV 1 g (07/17/24 1848)    atorvastatin   40 mg Oral QPC supper   Chlorhexidine  Gluconate Cloth  6 each Topical Q0600   cloNIDine   0.1 mg Oral BID   insulin  aspart  0-6 Units Subcutaneous TID WC   insulin  glargine-yfgn  7 Units Subcutaneous QHS   labetalol   200 mg Oral BID   metoCLOPramide   5 mg Oral Q6H   minoxidil   2.5 mg Oral BID   pantoprazole  (PROTONIX ) IV  40 mg Intravenous Q12H   sucralfate   1 g Oral QID    Dialysis Orders: TTS - [facility] 4hr, 400/500, EDW 92kg, 1K/2.5Ca bath, TDC, heparin  1000 + 1000/hr - Mircera 30mcg IV q 2 weeks - no VDRA  Assessment/Plan: Staph aureus bacteremia: Blood Cx 1/4 positive, on Cefazolin . WBC high. TDC removed 1/6 for line holiday. Abd pain with N/V: ?Related to #1, CTA without mesenteric ischemia. On PPI + sucralfate . Pain meds and anti-emetics per primary team. ESRD: Usual TTS schedule, s/p HD 1/5 - now on line holiday. Per VVS, plan for new [facility] placement tomorrow pending ID recs. Will then plan for HD after new TDC is placed. In the meantime, monitor volume and labs closely. Current AVF cannot be used until March.  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 Secondary HPTH: Ca low end - follow. T2DM: Per primary.  Charmaine Piety, NP [facility] 07/18/2024,9:56 AM  LOS: 3 days    "

## 2024-07-18 NOTE — Progress Notes (Signed)
" °  Progress Note    07/18/2024 7:37 AM * No surgery found *  Subjective:  no complaints   Vitals:   07/17/24 2206 07/18/24 0445  BP: (!) 112/59 116/67  Pulse: 88 74  Resp: 17   Temp: (!) 100.7 F (38.2 C) 98.5 F (36.9 C)  SpO2: 96% 99%   Physical Exam: Cardiac:  regular Lungs:  non labored Extremities: left AV fistula with good thrill. Incision healing well Abdomen: none Neurologic: alert and oriented  CBC    Component Value Date/Time   WBC 6.4 07/18/2024 0429   RBC 3.45 (L) 07/18/2024 0429   HGB 9.4 (L) 07/18/2024 0429   HGB 8.1 (L) 09/16/2020 1356   HCT 28.9 (L) 07/18/2024 0429   HCT 26.5 (L) 09/16/2020 1356   PLT 166 07/18/2024 0429   PLT 272 09/16/2020 1356   MCV 83.8 07/18/2024 0429   MCV 86 09/16/2020 1356   MCH 27.2 07/18/2024 0429   MCHC 32.5 07/18/2024 0429   RDW 16.2 (H) 07/18/2024 0429   RDW 15.3 09/16/2020 1356   LYMPHSABS 0.3 (L) 07/14/2024 0530   MONOABS 0.6 07/14/2024 0530   EOSABS 0.0 07/14/2024 0530   BASOSABS 0.0 07/14/2024 0530    BMET    Component Value Date/Time   NA 129 (L) 07/18/2024 0429   NA 143 09/16/2020 1356   K 3.6 07/18/2024 0429   CL 87 (L) 07/18/2024 0429   CO2 24 07/18/2024 0429   GLUCOSE 130 (H) 07/18/2024 0429   BUN 59 (H) 07/18/2024 0429   BUN 40 (H) 09/16/2020 1356   CREATININE 14.50 (H) 07/18/2024 0429   CALCIUM  7.2 (L) 07/18/2024 0429   CALCIUM  7.2 (L) 08/08/2020 0534   GFRNONAA 4 (L) 07/18/2024 0429   GFRAA 19 (L) 03/10/2020 1410    INR    Component Value Date/Time   INR 1.2 10/24/2022 1428     Intake/Output Summary (Last 24 hours) at 07/18/2024 0737 Last data filed at 07/17/2024 1700 Gross per 24 hour  Intake 340 ml  Output --  Net 340 ml     Assessment/Plan:  39 y.o. male with ESRD with bacteremia  TDC pulled yesterday for line holiday Plan is to place new [facility] tomorrow in OR pending recs by ID Will keep NPO after midnight Duplex of LUE AV fistula show fistula is patent and maturing well. Will  not be ready to use until March Will push back his outpatient follow up with us  since we obtained duplex during this admission    Teretha Damme, PA-C [facility] [phone] 07/18/2024 7:37 AM "

## 2024-07-18 NOTE — Care Management Important Message (Signed)
 Important Message  Patient Details  Name: [name] MRN: [id number] Date of Birth: 01-15-1986   Important Message Given:  Yes - Medicare IM     Jennie Laneta Dragon 07/18/2024, 2:50 PM

## 2024-07-18 NOTE — Progress Notes (Signed)
 "   [facility]  Date of Admission:  07/15/2024     Reason for Follow Up: Intractable nausea and vomiting  Total days of antibiotics 3         ASSESSMENT:  [name] is a 38 year old African-American male with diabetes and end-stage renal disease on hemodialysis presenting to the hospital with acute onset nausea and vomiting and found to have MSSA bacteremia with no significant abdominal findings on imaging or clear sources of infection outside of his dialysis catheter.   [name] dialysis catheter has been removed and new blood cultures ordered. Blood cultures from 07/16/24 are without growth to date. Discussed plan of care to continue current dose of cefazolin  and monitor blood cultures for clearance of bacteremia. TTE ordered and TEE scheduled to check for endocarditis. Plan to insert dialysis catheter tomorrow. No other clear sources of infection at this time. Await TEE and clearance of blood cultures for final recommendations. Remaining medical and supportive care per Internal Medicine.   PLAN:  Continue current dose of Cefazolin . Monitor blood cultures for clearance of bacteremia.  Await TTE and TEE results to check for endocarditis.  Standard/universal precautions.  Remaining medical and supportive care per Internal Medicine.   Principal Problem:   Intractable nausea and vomiting Active Problems:   Anemia of chronic disease   Insulin  dependent type 1 diabetes mellitus (HCC)   Hypertensive urgency   Abdominal pain   ESRD (end stage renal disease) on dialysis (HCC)   Chronic diastolic CHF (congestive heart failure) (HCC)   Prolonged QT interval   Leukocytosis   GERD (gastroesophageal reflux disease)   ESRD (end stage renal disease) (HCC)    atorvastatin   40 mg Oral QPC supper   Chlorhexidine  Gluconate Cloth  6 each Topical Q0600   cloNIDine   0.1 mg Oral BID   insulin  aspart  0-6 Units Subcutaneous TID WC   insulin  glargine-yfgn  7 Units  Subcutaneous QHS   labetalol   200 mg Oral BID   metoCLOPramide   5 mg Oral Q6H   minoxidil   2.5 mg Oral BID   pantoprazole  (PROTONIX ) IV  40 mg Intravenous Q12H   sucralfate   1 g Oral QID    SUBJECTIVE:  Afebrile overnight with no acute events. Catheter removed yesterday. Abdominal pain, nausea, and vomiting improved. Tolerating oral intake and antibiotics.   Allergies[1]   Review of Systems: Review of Systems  Constitutional:  Negative for chills, fever and weight loss.  Respiratory:  Negative for cough, shortness of breath and wheezing.   Cardiovascular:  Negative for chest pain and leg swelling.  Gastrointestinal:  Negative for abdominal pain, constipation, diarrhea, nausea and vomiting.  Skin:  Negative for rash.      OBJECTIVE: Vitals:   07/17/24 1809 07/17/24 2206 07/18/24 0445 07/18/24 1002  BP: 116/61 (!) 112/59 116/67 (!) 140/89  Pulse: 91 88 74 81  Resp: 16 17  16   Temp: 99.3 F (37.4 C) (!) 100.7 F (38.2 C) 98.5 F (36.9 C) 98.3 F (36.8 C)  TempSrc: Oral   Oral  SpO2: 100% 96% 99% 100%  Weight:      Height:       Body mass index is 27.21 kg/m.  Physical Exam Constitutional:      General: He is not in acute distress.    Appearance: He is well-developed.     Comments: Seated on the side of the bed eating breakfast; pleasant.   Cardiovascular:     Rate and Rhythm: Normal  rate and regular rhythm.     Heart sounds: Normal heart sounds.  Pulmonary:     Effort: Pulmonary effort is normal.     Breath sounds: Normal breath sounds.  Skin:    General: Skin is warm and dry.  Neurological:     Mental Status: He is alert and oriented to person, place, and time.     Lab Results Lab Results  Component Value Date   WBC 6.4 07/18/2024   HGB 9.4 (L) 07/18/2024   HCT 28.9 (L) 07/18/2024   MCV 83.8 07/18/2024   PLT 166 07/18/2024    Lab Results  Component Value Date   CREATININE 14.50 (H) 07/18/2024   BUN 59 (H) 07/18/2024   NA 129 (L) 07/18/2024    K 3.6 07/18/2024   CL 87 (L) 07/18/2024   CO2 24 07/18/2024    Lab Results  Component Value Date   ALT 17 07/15/2024   AST 24 07/15/2024   ALKPHOS 64 07/15/2024   BILITOT 0.4 07/15/2024     Microbiology: Recent Results (from the past 240 hours)  Resp panel by RT-PCR (RSV, Flu A&B, Covid) Anterior Nasal Swab     Status: None   Collection Time: 07/14/24  5:28 AM   Specimen: Anterior Nasal Swab  Result Value Ref Range Status   SARS Coronavirus 2 by RT PCR NEGATIVE NEGATIVE Final   Influenza A by PCR NEGATIVE NEGATIVE Final   Influenza B by PCR NEGATIVE NEGATIVE Final    Comment: (NOTE) The Xpert Xpress SARS-CoV-2/FLU/RSV plus assay is intended as an aid in the diagnosis of influenza from Nasopharyngeal swab specimens and should not be used as a sole basis for treatment. Nasal washings and aspirates are unacceptable for Xpert Xpress SARS-CoV-2/FLU/RSV testing.  Fact Sheet for Patients: bloggercourse.com  Fact Sheet for Healthcare Providers: seriousbroker.it  This test is not yet approved or cleared by the [place]  FDA and has been authorized for detection and/or diagnosis of SARS-CoV-2 by FDA under an Emergency Use Authorization (EUA). This EUA will remain in effect (meaning this test can be used) for the duration of the COVID-19 declaration under Section 564(b)(1) of the Act, 21 U.S.C. section 360bbb-3(b)(1), unless the authorization is terminated or revoked.     Resp Syncytial Virus by PCR NEGATIVE NEGATIVE Final    Comment: (NOTE) Fact Sheet for Patients: bloggercourse.com  Fact Sheet for Healthcare Providers: seriousbroker.it  This test is not yet approved or cleared by the [place]  FDA and has been authorized for detection and/or diagnosis of SARS-CoV-2 by FDA under an Emergency Use Authorization (EUA). This EUA will remain in effect (meaning this test can  be used) for the duration of the COVID-19 declaration under Section 564(b)(1) of the Act, 21 U.S.C. section 360bbb-3(b)(1), unless the authorization is terminated or revoked.  Performed at [facility], [address], [place], [place] [address]   Culture, blood (routine x 2)     Status: Abnormal   Collection Time: 07/15/24  3:29 AM   Specimen: BLOOD  Result Value Ref Range Status   Specimen Description BLOOD SITE NOT SPECIFIED  Final   Special Requests   Final    BOTTLES DRAWN AEROBIC AND ANAEROBIC Blood Culture results may not be optimal due to an inadequate volume of blood received in culture bottles   Culture  Setup Time   Final    GRAM POSITIVE COCCI IN CLUSTERS IN BOTH AEROBIC AND ANAEROBIC BOTTLES CRITICAL RESULT CALLED TO, READ BACK BY AND VERIFIED WITH:  PHARMD ERIN WILLIAMSON [id number] AT 1759 BY EC Performed at [facility], [address] [place], [place] [address]    Culture STAPHYLOCOCCUS AUREUS (A)  Final   Report Status 07/17/2024 FINAL  Final   Organism ID, Bacteria STAPHYLOCOCCUS AUREUS  Final      Susceptibility   Staphylococcus aureus - MIC*    CIPROFLOXACIN  <=0.5 SENSITIVE Sensitive     ERYTHROMYCIN <=0.25 SENSITIVE Sensitive     GENTAMICIN <=0.5 SENSITIVE Sensitive     OXACILLIN 0.5 SENSITIVE Sensitive     TETRACYCLINE <=1 SENSITIVE Sensitive     VANCOMYCIN  1 SENSITIVE Sensitive     TRIMETH /SULFA  <=10 SENSITIVE Sensitive     CLINDAMYCIN <=0.25 SENSITIVE Sensitive     RIFAMPIN <=0.5 SENSITIVE Sensitive     Inducible Clindamycin NEGATIVE Sensitive     LINEZOLID 2 SENSITIVE Sensitive     * STAPHYLOCOCCUS AUREUS  Blood Culture ID Panel (Reflexed)     Status: Abnormal   Collection Time: 07/15/24  3:29 AM  Result Value Ref Range Status   Enterococcus faecalis NOT DETECTED NOT DETECTED Final   Enterococcus Faecium NOT DETECTED NOT DETECTED Final   Listeria monocytogenes NOT DETECTED NOT DETECTED Final   Staphylococcus species DETECTED (A) NOT  DETECTED Final    Comment: CRITICAL RESULT CALLED TO, READ BACK BY AND VERIFIED WITH: PHARMD ERIN WILLIAMS ON [id number] AT 1759 BY EC    Staphylococcus aureus (BCID) DETECTED (A) NOT DETECTED Final    Comment: CRITICAL RESULT CALLED TO, READ BACK BY AND VERIFIED WITH: PHARMD ROCKY SLADE [id number] AT 1759 BY EC    Staphylococcus epidermidis NOT DETECTED NOT DETECTED Final   Staphylococcus lugdunensis NOT DETECTED NOT DETECTED Final   Streptococcus species NOT DETECTED NOT DETECTED Final   Streptococcus agalactiae NOT DETECTED NOT DETECTED Final   Streptococcus pneumoniae NOT DETECTED NOT DETECTED Final   Streptococcus pyogenes NOT DETECTED NOT DETECTED Final   A.calcoaceticus-baumannii NOT DETECTED NOT DETECTED Final   Bacteroides fragilis NOT DETECTED NOT DETECTED Final   Enterobacterales NOT DETECTED NOT DETECTED Final   Enterobacter cloacae complex NOT DETECTED NOT DETECTED Final   Escherichia coli NOT DETECTED NOT DETECTED Final   Klebsiella aerogenes NOT DETECTED NOT DETECTED Final   Klebsiella oxytoca NOT DETECTED NOT DETECTED Final   Klebsiella pneumoniae NOT DETECTED NOT DETECTED Final   Proteus species NOT DETECTED NOT DETECTED Final   Salmonella species NOT DETECTED NOT DETECTED Final   Serratia marcescens NOT DETECTED NOT DETECTED Final   Haemophilus influenzae NOT DETECTED NOT DETECTED Final   Neisseria meningitidis NOT DETECTED NOT DETECTED Final   Pseudomonas aeruginosa NOT DETECTED NOT DETECTED Final   Stenotrophomonas maltophilia NOT DETECTED NOT DETECTED Final   Candida albicans NOT DETECTED NOT DETECTED Final   Candida auris NOT DETECTED NOT DETECTED Final   Candida glabrata NOT DETECTED NOT DETECTED Final   Candida krusei NOT DETECTED NOT DETECTED Final   Candida parapsilosis NOT DETECTED NOT DETECTED Final   Candida tropicalis NOT DETECTED NOT DETECTED Final   Cryptococcus neoformans/gattii NOT DETECTED NOT DETECTED Final   Meth resistant mecA/C and MREJ NOT  DETECTED NOT DETECTED Final    Comment: Performed at [facility], [address], [place], [place] [address]  Respiratory (~20 pathogens) panel by PCR     Status: None   Collection Time: 07/15/24  8:03 AM   Specimen: Nasopharyngeal Swab; Respiratory  Result Value Ref Range Status   Adenovirus NOT DETECTED NOT DETECTED Final   Coronavirus 229E NOT  DETECTED NOT DETECTED Final    Comment: (NOTE) The Coronavirus on the Respiratory Panel, DOES NOT test for the novel  Coronavirus (2019 nCoV)    Coronavirus HKU1 NOT DETECTED NOT DETECTED Final   Coronavirus NL63 NOT DETECTED NOT DETECTED Final   Coronavirus OC43 NOT DETECTED NOT DETECTED Final   Metapneumovirus NOT DETECTED NOT DETECTED Final   Rhinovirus / Enterovirus NOT DETECTED NOT DETECTED Final   Influenza A NOT DETECTED NOT DETECTED Final   Influenza B NOT DETECTED NOT DETECTED Final   Parainfluenza Virus 1 NOT DETECTED NOT DETECTED Final   Parainfluenza Virus 2 NOT DETECTED NOT DETECTED Final   Parainfluenza Virus 3 NOT DETECTED NOT DETECTED Final   Parainfluenza Virus 4 NOT DETECTED NOT DETECTED Final   Respiratory Syncytial Virus NOT DETECTED NOT DETECTED Final   Bordetella pertussis NOT DETECTED NOT DETECTED Final   Bordetella Parapertussis NOT DETECTED NOT DETECTED Final   Chlamydophila pneumoniae NOT DETECTED NOT DETECTED Final   Mycoplasma pneumoniae NOT DETECTED NOT DETECTED Final    Comment: Performed at [facility], [address] [place], [place] [address]  Culture, blood (routine x 2)     Status: Abnormal   Collection Time: 07/15/24  3:14 PM   Specimen: BLOOD RIGHT HAND  Result Value Ref Range Status   Specimen Description BLOOD RIGHT HAND  Final   Special Requests   Final    BOTTLES DRAWN AEROBIC AND ANAEROBIC Blood Culture results may not be optimal due to an inadequate volume of blood received in culture bottles   Culture  Setup Time   Final    GRAM POSITIVE COCCI IN BOTH AEROBIC AND ANAEROBIC  BOTTLES CRITICAL VALUE NOTED.  VALUE IS CONSISTENT WITH PREVIOUSLY REPORTED AND CALLED VALUE.    Culture (A)  Final    STAPHYLOCOCCUS AUREUS SUSCEPTIBILITIES PERFORMED ON PREVIOUS CULTURE WITHIN THE LAST 5 DAYS. Performed at [facility], [address], [place], [place] [address]    Report Status 07/18/2024 FINAL  Final     Cathlyn July, NP [facility]  07/18/2024  2:55 PM     [1]  Allergies Allergen Reactions   Apresoline  [Hydralazine ] Nausea And Vomiting and Other (See Comments)    Patient was taking Hydralazine  AND Amlodipine  at the same time, so the reactions came from one of the 2: Lethargy and an all-over feeling of NOT feeling well   Norvasc  [Amlodipine ] Nausea And Vomiting and Other (See Comments)    Patient was taking Amlodipine  AND Hydralazine  at the same time, so the reactions came from one of the 2: Lethargy and an all-over feeling of NOT feeling well    "

## 2024-07-18 NOTE — Progress Notes (Signed)
 " PROGRESS NOTE  [name] FMW:[id number] DOB: Mar 27, 1986 DOA: 07/15/2024 PCP: Jaycee Greig PARAS, NP   LOS: 3 days   Brief narrative:   [name] is a 39 y.o. male with medical history significant of hypertension, hyperlipidemia, diastolic CHF, diabetes mellitus type 2,  ESRD on hemodialysis (TTS), and GERD with prior presents with persistent vomiting and abdominal pain.  In the ED patient was slightly tachycardic tachypneic and had low-grade fever with elevated blood pressure.  Chest x-ray without any infiltrate.   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  Patient had been given antiemetics, fentanyl  IV for pain, and Protonix  40 mg IV and was admitted to the hospital for further evaluation and treatment.   Assessment/Plan: Principal Problem:   Intractable nausea and vomiting Active Problems:   Abdominal pain   Leukocytosis   Hypertensive urgency   Anemia of chronic disease   Prolonged QT interval   ESRD (end stage renal disease) on dialysis (HCC)   Insulin  dependent type 1 diabetes mellitus (HCC)   Chronic diastolic CHF (congestive heart failure) (HCC)   GERD (gastroesophageal reflux disease)   ESRD (end stage renal disease) (HCC)  Intractable nausea and vomiting Abdominal pain Improved at this time.  Has been tolerating full liquids.  Wishes to be advanced on soft diet.  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 History of EGD LA grade D esophagitis, Mallory-Weiss tear, gastric polyp, and gastritis.  Continue Protonix  40 milligram twice daily, continue Carafate .  CTA showing no significant stenosis or sign of obstruction.  Continue supportive care scopolamine  patch.  Flu COVID and influenza was negative.    Reinforced against narcotics if possible.  Staphylococcus aureus bacteremia on blood cultures.  Repeat blood cultures have been ordered after hemodialysis catheter line removal.  ID on board.  Follow ID recommendation.  Tentative plan for TEE on Friday, 07/20/2024.SABRA  Still with a temperature max of 100.7 F but her leukocytosis has improved.  Status post hemodialysis catheter removal by vascular surgery on 07/17/2024.  Plan for dialysis catheter placement likely tomorrow if okay with ID.  Leukocytosis Status post hemodialysis catheter removal with improvement in WBC WBC at 6.4 from 12.8.  No fever or chills.  ID on board.  Repeat blood cultures from 07/16/2024 pending..  Preliminary blood culture with Staph aureus from 07/15/2024.SABRA     Hypertensive urgency Continue clonidine  labetalol  and minoxidil ,.  Continue to monitor closely.   Anemia of chronic kidney disease Latest hemoglobin of 9.4 from 9.6.  Will monitor.  ESRD on HD On Tuesday Thursday Saturday schedule.  Hemodialysis as per nephrology.  Hyponatremia.  Patient is on hemodialysis.  Will continue to monitor.   Insulin -dependent diabetes mellitus  Recent hemoglobin A1c of 6.5  on 06/18/2024.  Continue Semglee  at night and sliding scale insulin .  Overall glycemic control adequate.  Latest POC of 121.   Diastolic congestive heart failure Compensated at this time.  Review of last echocardiogram with LV EF to be 60 to 65% with grade 3 diastolic dysfunction on 10/2022.  Continue f follow-up management with dialysis.  DVT prophylaxis: SCDs Start: 07/15/24 0801   Disposition: Home likely in 2 to 3 days, pending TEE, blood cultures.  Status is: Inpatient Remains inpatient appropriate because: Pending clinical improvement, IV antibiotic, pending further  workup    Code Status:     Code Status: Full Code  Family Communication: Spoke with the patient's aunt at bedside on 07/16/2024  Consultants: Nephrology Infectious  disease  Procedures:  Hemodialysis Right internal jugular hemodialysis catheter removal on 07/17/2024  Anti-infectives:  Cefazolin  IV  Anti-infectives (From admission, onward)    Start     Dose/Rate Route Frequency Ordered Stop   07/15/24 1815  ceFAZolin  (ANCEF ) IVPB 1 g/50 mL premix        1 g 100 mL/hr over 30 Minutes Intravenous Every 24 hours 07/15/24 1805         Subjective: Today, patient was seen and examined at bedside.  Patient states that he feels better and wishes to try more food.  Denies any shortness of breath dyspnea pain but did noted a low-grade fever.     Objective: Vitals:   07/18/24 0445 07/18/24 1002  BP: 116/67 (!) 140/89  Pulse: 74 81  Resp:  16  Temp: 98.5 F (36.9 C) 98.3 F (36.8 C)  SpO2: 99% 100%    Intake/Output Summary (Last 24 hours) at 07/18/2024 1056 Last data filed at 07/17/2024 1700 Gross per 24 hour  Intake 340 ml  Output --  Net 340 ml   Filed Weights   07/15/24 0155 07/16/24 1449 07/16/24 1840  Weight: 90.7 kg 93 kg 91 kg   Body mass index is 27.21 kg/m.   Physical Exam:  General:  Average built, not in obvious distress, legally blind, HENT:   No scleral pallor or icterus noted. Oral mucosa is moist.  Chest:  Clear breath sounds.  Diminished breath sounds bilaterally. No crackles or wheezes.  CVS: S1 &S2 heard. No murmur.  Regular rate and rhythm. Abdomen: Soft, nontender, nondistended.  Bowel sounds are heard.   Extremities: No cyanosis, clubbing or edema.  Left upper extremity AV fistula in place Psych: Alert, awake and oriented, normal mood CNS: Legally blind, power equal in all extremities.   Skin: Warm and dry skin with pigmentation.   Data Review: I have personally reviewed the following laboratory data and studies,  CBC: Recent Labs  Lab 07/14/24 0530 07/15/24 0218 07/15/24 0910 07/16/24 0427 07/17/24 0446 07/18/24 0429  WBC 8.4 12.4*  --  12.8* 7.4 6.4  NEUTROABS 7.4  --   --   --   --   --   HGB  11.3* 10.9* 9.5* 9.6* 9.6* 9.4*  HCT 35.4* 33.1* 29.5* 30.1* 29.1* 28.9*  MCV 87.0 83.2  --  85.0 82.9 83.8  PLT 236 216  --  159 182 166   Basic Metabolic Panel: Recent Labs  Lab 07/14/24 0530 07/15/24 0218 07/16/24 0427 07/17/24 0446 07/18/24 0429  NA 138 135 132* 131* 129*  K 4.1 3.7 4.7 3.5 3.6  CL 94* 90* 87* 87* 87*  CO2 25 23 21* 26 24  GLUCOSE 142* 172* 159* 122* 130*  BUN 49* 66* 83* 50* 59*  CREATININE 12.10* 14.80* 17.40* 12.20* 14.50*  CALCIUM  7.8* 7.9* 7.1* 7.7* 7.2*  MG 2.0  --  2.1 2.2 2.2   Liver Function Tests: Recent Labs  Lab 07/14/24 0530 07/15/24 0218  AST 16 24  ALT 10 17  ALKPHOS 72 64  BILITOT 0.4 0.4  PROT 8.2* 8.2*  ALBUMIN 4.4 4.3   Recent Labs  Lab 07/15/24 0218  LIPASE 13   No results for input(s): AMMONIA in the last 168 hours. Cardiac Enzymes: Recent Labs  Lab 07/14/24 0530  CKTOTAL 272  BNP (last 3 results) Recent Labs    08/02/23 1120  BNP 3,974.0*    ProBNP (last 3 results) No results for input(s): PROBNP in the last 8760 hours.  CBG: Recent Labs  Lab 07/17/24 0749 07/17/24 1318 07/17/24 1652 07/17/24 2204 07/18/24 0745  GLUCAP 139* 177* 162* 121* 121*   Recent Results (from the past 240 hours)  Resp panel by RT-PCR (RSV, Flu A&B, Covid) Anterior Nasal Swab     Status: None   Collection Time: 07/14/24  5:28 AM   Specimen: Anterior Nasal Swab  Result Value Ref Range Status   SARS Coronavirus 2 by RT PCR NEGATIVE NEGATIVE Final   Influenza A by PCR NEGATIVE NEGATIVE Final   Influenza B by PCR NEGATIVE NEGATIVE Final    Comment: (NOTE) The Xpert Xpress SARS-CoV-2/FLU/RSV plus assay is intended as an aid in the diagnosis of influenza from Nasopharyngeal swab specimens and should not be used as a sole basis for treatment. Nasal washings and aspirates are unacceptable for Xpert Xpress SARS-CoV-2/FLU/RSV testing.  Fact Sheet for Patients: bloggercourse.com  Fact Sheet for  Healthcare Providers: seriousbroker.it  This test is not yet approved or cleared by the [place]  FDA and has been authorized for detection and/or diagnosis of SARS-CoV-2 by FDA under an Emergency Use Authorization (EUA). This EUA will remain in effect (meaning this test can be used) for the duration of the COVID-19 declaration under Section 564(b)(1) of the Act, 21 U.S.C. section 360bbb-3(b)(1), unless the authorization is terminated or revoked.     Resp Syncytial Virus by PCR NEGATIVE NEGATIVE Final    Comment: (NOTE) Fact Sheet for Patients: bloggercourse.com  Fact Sheet for Healthcare Providers: seriousbroker.it  This test is not yet approved or cleared by the [place]  FDA and has been authorized for detection and/or diagnosis of SARS-CoV-2 by FDA under an Emergency Use Authorization (EUA). This EUA will remain in effect (meaning this test can be used) for the duration of the COVID-19 declaration under Section 564(b)(1) of the Act, 21 U.S.C. section 360bbb-3(b)(1), unless the authorization is terminated or revoked.  Performed at [facility], [address], [place], [place] [address]   Culture, blood (routine x 2)     Status: Abnormal   Collection Time: 07/15/24  3:29 AM   Specimen: BLOOD  Result Value Ref Range Status   Specimen Description BLOOD SITE NOT SPECIFIED  Final   Special Requests   Final    BOTTLES DRAWN AEROBIC AND ANAEROBIC Blood Culture results may not be optimal due to an inadequate volume of blood received in culture bottles   Culture  Setup Time   Final    GRAM POSITIVE COCCI IN CLUSTERS IN BOTH AEROBIC AND ANAEROBIC BOTTLES CRITICAL RESULT CALLED TO, READ BACK BY AND VERIFIED WITH: PHARMD ERIN WILLIAMSON [id number] AT 1759 BY EC Performed at [facility], [address] [place], [place] [address]    Culture STAPHYLOCOCCUS AUREUS (A)  Final   Report Status  07/17/2024 FINAL  Final   Organism ID, Bacteria STAPHYLOCOCCUS AUREUS  Final      Susceptibility   Staphylococcus aureus - MIC*    CIPROFLOXACIN  <=0.5 SENSITIVE Sensitive     ERYTHROMYCIN <=0.25 SENSITIVE Sensitive     GENTAMICIN <=0.5 SENSITIVE Sensitive     OXACILLIN 0.5 SENSITIVE Sensitive     TETRACYCLINE <=1 SENSITIVE Sensitive     VANCOMYCIN  1 SENSITIVE Sensitive     TRIMETH /SULFA  <=10 SENSITIVE Sensitive     CLINDAMYCIN <=0.25 SENSITIVE Sensitive  RIFAMPIN <=0.5 SENSITIVE Sensitive     Inducible Clindamycin NEGATIVE Sensitive     LINEZOLID 2 SENSITIVE Sensitive     * STAPHYLOCOCCUS AUREUS  Blood Culture ID Panel (Reflexed)     Status: Abnormal   Collection Time: 07/15/24  3:29 AM  Result Value Ref Range Status   Enterococcus faecalis NOT DETECTED NOT DETECTED Final   Enterococcus Faecium NOT DETECTED NOT DETECTED Final   Listeria monocytogenes NOT DETECTED NOT DETECTED Final   Staphylococcus species DETECTED (A) NOT DETECTED Final    Comment: CRITICAL RESULT CALLED TO, READ BACK BY AND VERIFIED WITH: PHARMD ERIN WILLIAMS ON [id number] AT 1759 BY EC    Staphylococcus aureus (BCID) DETECTED (A) NOT DETECTED Final    Comment: CRITICAL RESULT CALLED TO, READ BACK BY AND VERIFIED WITH: PHARMD ROCKY SLADE [id number] AT 1759 BY EC    Staphylococcus epidermidis NOT DETECTED NOT DETECTED Final   Staphylococcus lugdunensis NOT DETECTED NOT DETECTED Final   Streptococcus species NOT DETECTED NOT DETECTED Final   Streptococcus agalactiae NOT DETECTED NOT DETECTED Final   Streptococcus pneumoniae NOT DETECTED NOT DETECTED Final   Streptococcus pyogenes NOT DETECTED NOT DETECTED Final   A.calcoaceticus-baumannii NOT DETECTED NOT DETECTED Final   Bacteroides fragilis NOT DETECTED NOT DETECTED Final   Enterobacterales NOT DETECTED NOT DETECTED Final   Enterobacter cloacae complex NOT DETECTED NOT DETECTED Final   Escherichia coli NOT DETECTED NOT DETECTED Final   Klebsiella  aerogenes NOT DETECTED NOT DETECTED Final   Klebsiella oxytoca NOT DETECTED NOT DETECTED Final   Klebsiella pneumoniae NOT DETECTED NOT DETECTED Final   Proteus species NOT DETECTED NOT DETECTED Final   Salmonella species NOT DETECTED NOT DETECTED Final   Serratia marcescens NOT DETECTED NOT DETECTED Final   Haemophilus influenzae NOT DETECTED NOT DETECTED Final   Neisseria meningitidis NOT DETECTED NOT DETECTED Final   Pseudomonas aeruginosa NOT DETECTED NOT DETECTED Final   Stenotrophomonas maltophilia NOT DETECTED NOT DETECTED Final   Candida albicans NOT DETECTED NOT DETECTED Final   Candida auris NOT DETECTED NOT DETECTED Final   Candida glabrata NOT DETECTED NOT DETECTED Final   Candida krusei NOT DETECTED NOT DETECTED Final   Candida parapsilosis NOT DETECTED NOT DETECTED Final   Candida tropicalis NOT DETECTED NOT DETECTED Final   Cryptococcus neoformans/gattii NOT DETECTED NOT DETECTED Final   Meth resistant mecA/C and MREJ NOT DETECTED NOT DETECTED Final    Comment: Performed at [facility], [address] [place], [place] [address]  Respiratory (~20 pathogens) panel by PCR     Status: None   Collection Time: 07/15/24  8:03 AM   Specimen: Nasopharyngeal Swab; Respiratory  Result Value Ref Range Status   Adenovirus NOT DETECTED NOT DETECTED Final   Coronavirus 229E NOT DETECTED NOT DETECTED Final    Comment: (NOTE) The Coronavirus on the Respiratory Panel, DOES NOT test for the novel  Coronavirus (2019 nCoV)    Coronavirus HKU1 NOT DETECTED NOT DETECTED Final   Coronavirus NL63 NOT DETECTED NOT DETECTED Final   Coronavirus OC43 NOT DETECTED NOT DETECTED Final   Metapneumovirus NOT DETECTED NOT DETECTED Final   Rhinovirus / Enterovirus NOT DETECTED NOT DETECTED Final   Influenza A NOT DETECTED NOT DETECTED Final   Influenza B NOT DETECTED NOT DETECTED Final   Parainfluenza Virus 1 NOT DETECTED NOT DETECTED Final   Parainfluenza Virus 2 NOT DETECTED NOT DETECTED  Final   Parainfluenza Virus 3 NOT DETECTED NOT DETECTED Final   Parainfluenza Virus 4 NOT DETECTED  NOT DETECTED Final   Respiratory Syncytial Virus NOT DETECTED NOT DETECTED Final   Bordetella pertussis NOT DETECTED NOT DETECTED Final   Bordetella Parapertussis NOT DETECTED NOT DETECTED Final   Chlamydophila pneumoniae NOT DETECTED NOT DETECTED Final   Mycoplasma pneumoniae NOT DETECTED NOT DETECTED Final    Comment: Performed at [facility], [address] [place], [place] [address]  Culture, blood (routine x 2)     Status: Abnormal   Collection Time: 07/15/24  3:14 PM   Specimen: BLOOD RIGHT HAND  Result Value Ref Range Status   Specimen Description BLOOD RIGHT HAND  Final   Special Requests   Final    BOTTLES DRAWN AEROBIC AND ANAEROBIC Blood Culture results may not be optimal due to an inadequate volume of blood received in culture bottles   Culture  Setup Time   Final    GRAM POSITIVE COCCI IN BOTH AEROBIC AND ANAEROBIC BOTTLES CRITICAL VALUE NOTED.  VALUE IS CONSISTENT WITH PREVIOUSLY REPORTED AND CALLED VALUE.    Culture (A)  Final    STAPHYLOCOCCUS AUREUS SUSCEPTIBILITIES PERFORMED ON PREVIOUS CULTURE WITHIN THE LAST 5 DAYS. Performed at [facility], [address] [place], [place] [address]    Report Status 07/18/2024 FINAL  Final     Studies: VAS US  DUPLEX DIALYSIS ACCESS (AVF, AVG) Result Date: 07/18/2024 DIALYSIS ACCESS Patient Name:  TREVIN GARTRELL Mcdaris  Date of Exam:   07/17/2024 Medical Rec #: [id number]        Accession #:    [id number] Date of Birth: 29-Oct-1985        Patient Gender: M Patient Age:   27 years Exam Location:  [facility] Procedure:      VAS US  DUPLEX DIALYSIS ACCESS (AVF, AVG) Referring Phys: [facility] --------------------------------------------------------------------------------  Reason for Exam: Routine follow up. Access Site: Left Upper Extremity. Access Type: Brachial-cephalic AVF. History: Brachiocephalic fistula creation  06/22/24          12/02/21 Plication left radius cephalic fistula          07/24/20 - Left upper Ext AVF Creation. Performing Technologist: Ricka Sturdivant-Jones RDMS, RVT  Examination Guidelines: A complete evaluation includes B-mode imaging, spectral Doppler, color Doppler, and power Doppler as needed of all accessible portions of each vessel. Unilateral testing is considered an integral part of a complete examination. Limited examinations for reoccurring indications may be performed as noted.  Findings: +--------------------+----------+-----------------+--------+ AVF                 PSV (cm/s)Flow Vol (mL/min)Comments +--------------------+----------+-----------------+--------+ Native artery inflow   131           305                +--------------------+----------+-----------------+--------+ AVF Anastomosis        583                              +--------------------+----------+-----------------+--------+  +------------+----------+-------------+----------+----------------------------+ OUTFLOW VEINPSV (cm/s)Diameter (cm)Depth (cm)          Describe           +------------+----------+-------------+----------+----------------------------+ Shoulder        84        0.44        0.53                                +------------+----------+-------------+----------+----------------------------+ Prox UA  64        0.60        0.44         competing branch       +------------+----------+-------------+----------+----------------------------+ Mid UA          94        0.58        0.20   a large branch with thrombus +------------+----------+-------------+----------+----------------------------+ Dist UA        277        0.88        0.16                                +------------+----------+-------------+----------+----------------------------+ AC Fossa       408        0.39        0.50                                 +------------+----------+-------------+----------+----------------------------+   Summary: Patent arteriovenous fistula with less than 100 cm/s noted in the proximal and mid segments. Low flow volume.  *See table(s) above for measurements and observations.  Diagnosing physician: Debby Robertson Electronically signed by Debby Robertson on 07/18/2024 at 7:47:59 AM.   --------------------------------------------------------------------------------   Final        Vernal Alstrom, MD  [facility] 07/18/2024  If 7PM-7AM, please contact night-coverage             "

## 2024-07-19 ENCOUNTER — Inpatient Hospital Stay (HOSPITAL_COMMUNITY)

## 2024-07-19 ENCOUNTER — Encounter (HOSPITAL_COMMUNITY): Payer: Self-pay | Admitting: Internal Medicine

## 2024-07-19 ENCOUNTER — Inpatient Hospital Stay (HOSPITAL_COMMUNITY): Payer: Self-pay | Admitting: Certified Registered"

## 2024-07-19 ENCOUNTER — Encounter (HOSPITAL_COMMUNITY)
Admission: EM | Disposition: A | Discharge status: Home / Self Care | Payer: Self-pay | Source: Home / Self Care | Attending: Internal Medicine

## 2024-07-19 DIAGNOSIS — N186 End stage renal disease: Secondary | ICD-10-CM

## 2024-07-19 DIAGNOSIS — E1022 Type 1 diabetes mellitus with diabetic chronic kidney disease: Secondary | ICD-10-CM | POA: Diagnosis not present

## 2024-07-19 DIAGNOSIS — I5022 Chronic systolic (congestive) heart failure: Secondary | ICD-10-CM

## 2024-07-19 DIAGNOSIS — I132 Hypertensive heart and chronic kidney disease with heart failure and with stage 5 chronic kidney disease, or end stage renal disease: Secondary | ICD-10-CM | POA: Diagnosis not present

## 2024-07-19 HISTORY — PX: ULTRASOUND GUIDANCE FOR VASCULAR ACCESS: SHX6516

## 2024-07-19 HISTORY — PX: INSERTION OF DIALYSIS CATHETER: SHX1324

## 2024-07-19 LAB — CBC WITH DIFFERENTIAL/PLATELET
Abs Immature Granulocytes: 0.03 K/uL (ref 0.00–0.07)
Basophils Absolute: 0 K/uL (ref 0.0–0.1)
Basophils Relative: 1 %
Eosinophils Absolute: 0.2 K/uL (ref 0.0–0.5)
Eosinophils Relative: 2 %
HCT: 27.7 % — ABNORMAL LOW (ref 39.0–52.0)
Hemoglobin: 9.1 g/dL — ABNORMAL LOW (ref 13.0–17.0)
Immature Granulocytes: 1 %
Lymphocytes Relative: 16 %
Lymphs Abs: 1 K/uL (ref 0.7–4.0)
MCH: 27 pg (ref 26.0–34.0)
MCHC: 32.9 g/dL (ref 30.0–36.0)
MCV: 82.2 fL (ref 80.0–100.0)
Monocytes Absolute: 0.9 K/uL (ref 0.1–1.0)
Monocytes Relative: 14 %
Neutro Abs: 4.1 K/uL (ref 1.7–7.7)
Neutrophils Relative %: 66 %
Platelets: 190 K/uL (ref 150–400)
RBC: 3.37 MIL/uL — ABNORMAL LOW (ref 4.22–5.81)
RDW: 15.8 % — ABNORMAL HIGH (ref 11.5–15.5)
WBC: 6.2 K/uL (ref 4.0–10.5)
nRBC: 0 % (ref 0.0–0.2)

## 2024-07-19 LAB — GLUCOSE, CAPILLARY
Glucose-Capillary: 151 mg/dL — ABNORMAL HIGH (ref 70–99)
Glucose-Capillary: 151 mg/dL — ABNORMAL HIGH (ref 70–99)
Glucose-Capillary: 130 mg/dL — ABNORMAL HIGH (ref 70–99)
Glucose-Capillary: 239 mg/dL — ABNORMAL HIGH (ref 70–99)

## 2024-07-19 LAB — RENAL FUNCTION PANEL
Albumin: 3.6 g/dL (ref 3.5–5.0)
Anion gap: 22 — ABNORMAL HIGH (ref 5–15)
BUN: 72 mg/dL — ABNORMAL HIGH (ref 6–20)
CO2: 20 mmol/L — ABNORMAL LOW (ref 22–32)
Calcium: 7.3 mg/dL — ABNORMAL LOW (ref 8.9–10.3)
Chloride: 86 mmol/L — ABNORMAL LOW (ref 98–111)
Creatinine, Ser: 16.3 mg/dL — ABNORMAL HIGH (ref 0.61–1.24)
GFR, Estimated: 3 mL/min — ABNORMAL LOW
Glucose, Bld: 155 mg/dL — ABNORMAL HIGH (ref 70–99)
Phosphorus: 5.6 mg/dL — ABNORMAL HIGH (ref 2.5–4.6)
Potassium: 4 mmol/L (ref 3.5–5.1)
Sodium: 128 mmol/L — ABNORMAL LOW (ref 135–145)

## 2024-07-19 MED ORDER — PROPOFOL 10 MG/ML IV BOLUS
INTRAVENOUS | Status: AC
  Filled 2024-07-19: qty 20

## 2024-07-19 MED ORDER — ONDANSETRON HCL 4 MG/2ML IJ SOLN
INTRAMUSCULAR | Status: DC | PRN
  Administered 2024-07-19: 4 mg via INTRAVENOUS

## 2024-07-19 MED ORDER — LIDOCAINE 2% (20 MG/ML) 5 ML SYRINGE
INTRAMUSCULAR | Status: DC | PRN
  Administered 2024-07-19: 100 mg via INTRAVENOUS

## 2024-07-19 MED ORDER — PROPOFOL 10 MG/ML IV BOLUS
INTRAVENOUS | Status: DC | PRN
  Administered 2024-07-19: 150 mg via INTRAVENOUS

## 2024-07-19 MED ORDER — EPHEDRINE 5 MG/ML INJ
INTRAVENOUS | Status: AC
  Filled 2024-07-19: qty 5

## 2024-07-19 MED ORDER — FENTANYL CITRATE (PF) 250 MCG/5ML IJ SOLN
INTRAMUSCULAR | Status: DC | PRN
  Administered 2024-07-19: 100 ug via INTRAVENOUS

## 2024-07-19 MED ORDER — LIDOCAINE HCL (PF) 1 % IJ SOLN: 5.0000 mL | INTRAMUSCULAR | Status: DC | PRN

## 2024-07-19 MED ORDER — CEFAZOLIN SODIUM-DEXTROSE 2-3 GM-%(50ML) IV SOLR
INTRAVENOUS | Status: DC | PRN
  Administered 2024-07-19: 2 g via INTRAVENOUS

## 2024-07-19 MED ORDER — PHENYLEPHRINE 80 MCG/ML (10ML) SYRINGE FOR IV PUSH (FOR BLOOD PRESSURE SUPPORT)
PREFILLED_SYRINGE | INTRAVENOUS | Status: DC | PRN
  Administered 2024-07-19 (×2): 80 ug via INTRAVENOUS

## 2024-07-19 MED ORDER — ONDANSETRON HCL 4 MG/2ML IJ SOLN: 4.0000 mg | Freq: Once | INTRAMUSCULAR | Status: DC | PRN

## 2024-07-19 MED ORDER — OXYCODONE HCL 5 MG PO TABS: 5.0000 mg | ORAL_TABLET | Freq: Once | ORAL | Status: DC | PRN

## 2024-07-19 MED ORDER — CEFAZOLIN SODIUM-DEXTROSE 2-4 GM/100ML-% IV SOLN
INTRAVENOUS | Status: AC
  Filled 2024-07-19: qty 100

## 2024-07-19 MED ORDER — ALTEPLASE 2 MG IJ SOLR: 2.0000 mg | Freq: Once | INTRAMUSCULAR | Status: DC | PRN

## 2024-07-19 MED ORDER — HEPARIN 6000 UNIT IRRIGATION SOLUTION
Status: AC
  Filled 2024-07-19: qty 500

## 2024-07-19 MED ORDER — PANTOPRAZOLE SODIUM 40 MG PO TBEC
40.0000 mg | DELAYED_RELEASE_TABLET | Freq: Two times a day (BID) | ORAL | Status: DC
  Administered 2024-07-20: 40 mg via ORAL
  Filled 2024-07-19 (×2): qty 1

## 2024-07-19 MED ORDER — SODIUM CHLORIDE 0.9 % IV SOLN: INTRAVENOUS | Status: DC

## 2024-07-19 MED ORDER — SUGAMMADEX SODIUM 200 MG/2ML IV SOLN
INTRAVENOUS | Status: DC | PRN
  Administered 2024-07-19: 200 mg via INTRAVENOUS

## 2024-07-19 MED ORDER — DEXAMETHASONE SOD PHOSPHATE PF 10 MG/ML IJ SOLN
INTRAMUSCULAR | Status: AC
  Filled 2024-07-19: qty 1

## 2024-07-19 MED ORDER — PHENYLEPHRINE 80 MCG/ML (10ML) SYRINGE FOR IV PUSH (FOR BLOOD PRESSURE SUPPORT)
PREFILLED_SYRINGE | INTRAVENOUS | Status: AC
  Filled 2024-07-19: qty 10

## 2024-07-19 MED ORDER — FENTANYL CITRATE (PF) 100 MCG/2ML IJ SOLN
INTRAMUSCULAR | Status: AC
  Filled 2024-07-19: qty 2

## 2024-07-19 MED ORDER — ROCURONIUM BROMIDE 10 MG/ML (PF) SYRINGE
PREFILLED_SYRINGE | INTRAVENOUS | Status: AC
  Filled 2024-07-19: qty 10

## 2024-07-19 MED ORDER — PENTAFLUOROPROP-TETRAFLUOROETH EX AERO: 1.0000 | INHALATION_SPRAY | CUTANEOUS | Status: DC | PRN

## 2024-07-19 MED ORDER — DEXAMETHASONE SOD PHOSPHATE PF 10 MG/ML IJ SOLN
INTRAMUSCULAR | Status: DC | PRN
  Administered 2024-07-19: 10 mg via INTRAVENOUS

## 2024-07-19 MED ORDER — CHLORHEXIDINE GLUCONATE 0.12 % MT SOLN
OROMUCOSAL | Status: AC
  Filled 2024-07-19: qty 15

## 2024-07-19 MED ORDER — HEPARIN SODIUM (PORCINE) 1000 UNIT/ML DIALYSIS
1000.0000 [IU] | INTRAMUSCULAR | Status: DC | PRN
  Administered 2024-07-19: 4000 [IU] via INTRAVENOUS_CENTRAL

## 2024-07-19 MED ORDER — LIDOCAINE 2% (20 MG/ML) 5 ML SYRINGE
INTRAMUSCULAR | Status: AC
  Filled 2024-07-19: qty 5

## 2024-07-19 MED ORDER — SUCCINYLCHOLINE CHLORIDE 200 MG/10ML IV SOSY
PREFILLED_SYRINGE | INTRAVENOUS | Status: DC | PRN
  Administered 2024-07-19: 140 mg via INTRAVENOUS

## 2024-07-19 MED ORDER — ONDANSETRON HCL 4 MG/2ML IJ SOLN
INTRAMUSCULAR | Status: AC
  Filled 2024-07-19: qty 2

## 2024-07-19 MED ORDER — ORAL CARE MOUTH RINSE: 15.0000 mL | Freq: Once | OROMUCOSAL | Status: AC

## 2024-07-19 MED ORDER — HEPARIN SODIUM (PORCINE) 5000 UNIT/ML IJ SOLN
5000.0000 [IU] | Freq: Three times a day (TID) | INTRAMUSCULAR | Status: DC
  Administered 2024-07-19 – 2024-07-20 (×3): 5000 [IU] via SUBCUTANEOUS
  Filled 2024-07-19 (×3): qty 1

## 2024-07-19 MED ORDER — HEPARIN SODIUM (PORCINE) 1000 UNIT/ML IJ SOLN
INTRAMUSCULAR | Status: DC | PRN
  Administered 2024-07-19: 3200 [IU]

## 2024-07-19 MED ORDER — HEPARIN SODIUM (PORCINE) 1000 UNIT/ML IJ SOLN
INTRAMUSCULAR | Status: AC
  Filled 2024-07-19: qty 10

## 2024-07-19 MED ORDER — PHENYLEPHRINE HCL-NACL 20-0.9 MG/250ML-% IV SOLN
INTRAVENOUS | Status: DC | PRN
  Administered 2024-07-19: 25 ug/min via INTRAVENOUS

## 2024-07-19 MED ORDER — METOCLOPRAMIDE HCL 5 MG PO TABS: 5.0000 mg | ORAL_TABLET | Freq: Three times a day (TID) | ORAL | Status: DC | PRN

## 2024-07-19 MED ORDER — HEPARIN 6000 UNIT IRRIGATION SOLUTION
Status: DC | PRN
  Administered 2024-07-19: 1

## 2024-07-19 MED ORDER — OXYCODONE HCL 5 MG/5ML PO SOLN: 5.0000 mg | Freq: Once | ORAL | Status: DC | PRN

## 2024-07-19 MED ORDER — HEPARIN SODIUM (PORCINE) 1000 UNIT/ML IJ SOLN
INTRAMUSCULAR | Status: AC
  Filled 2024-07-19: qty 4

## 2024-07-19 MED ORDER — CHLORHEXIDINE GLUCONATE 0.12 % MT SOLN: 15.0000 mL | Freq: Once | OROMUCOSAL | Status: AC

## 2024-07-19 MED ORDER — ROCURONIUM BROMIDE 10 MG/ML (PF) SYRINGE
PREFILLED_SYRINGE | INTRAVENOUS | Status: DC | PRN
  Administered 2024-07-19: 40 mg via INTRAVENOUS

## 2024-07-19 MED ORDER — LIDOCAINE-PRILOCAINE 2.5-2.5 % EX CREA: 1.0000 | TOPICAL_CREAM | CUTANEOUS | Status: DC | PRN

## 2024-07-19 MED ORDER — FENTANYL CITRATE (PF) 100 MCG/2ML IJ SOLN
25.0000 ug | INTRAMUSCULAR | Status: DC | PRN
  Administered 2024-07-19 (×2): 25 ug via INTRAVENOUS

## 2024-07-19 MED ORDER — ACETAMINOPHEN 10 MG/ML IV SOLN
1000.0000 mg | Freq: Once | INTRAVENOUS | Status: DC | PRN
  Administered 2024-07-19: 1000 mg via INTRAVENOUS

## 2024-07-19 NOTE — Progress Notes (Signed)
 " [facility] Progress Note   Subjective:    Seen and examined patient at bedside. S/p TDC placement today by VVS. He denies SOB, CP, and N/V. Plan for HD tonight. Also plan for TEE tomorrow.  Objective Vitals:   07/19/24 1245 07/19/24 1300 07/19/24 1315 07/19/24 1333  BP: 139/83 (!) 146/85 (!) 154/93 (!) 152/83  Pulse: 78 78 78 80  Resp: 19 10 14 18   Temp:   98.1 F (36.7 C) 97.9 F (36.6 C)  TempSrc:      SpO2: 99% 98% 100% 100%  Weight:      Height:       Physical Exam General: Well appearing, NAD. Room air Heart: RRR; no murmur Lungs: CTAB; no rales Abdomen: soft Extremities: no LE edema Dialysis Access: New TDC placed today; LUE AVF +b/t (deep in prox arm)-cannot be used until March per VVS  [facility] Weights   07/15/24 0155 07/16/24 1449 07/16/24 1840  Weight: 90.7 kg 93 kg 91 kg    Intake/Output Summary (Last 24 hours) at 07/19/2024 1521 Last data filed at 07/19/2024 1216 Gross per 24 hour  Intake 300 ml  Output 5 ml  Net 295 ml    Additional Objective Labs: Basic Metabolic Panel: Recent Labs  Lab 07/17/24 0446 07/18/24 0429 07/19/24 0618  NA 131* 129* 128*  K 3.5 3.6 4.0  CL 87* 87* 86*  CO2 26 24 20*  GLUCOSE 122* 130* 155*  BUN 50* 59* 72*  CREATININE 12.20* 14.50* 16.30*  CALCIUM  7.7* 7.2* 7.3*  PHOS  --   --  5.6*   Liver Function Tests: Recent Labs  Lab 07/14/24 0530 07/15/24 0218 07/19/24 0618  AST 16 24  --   ALT 10 17  --   ALKPHOS 72 64  --   BILITOT 0.4 0.4  --   PROT 8.2* 8.2*  --   ALBUMIN 4.4 4.3 3.6   Recent Labs  Lab 07/15/24 0218  LIPASE 13   CBC: Recent Labs  Lab 07/14/24 0530 07/15/24 0218 07/15/24 0910 07/16/24 0427 07/17/24 0446 07/18/24 0429 07/19/24 0618  WBC 8.4 12.4*  --  12.8* 7.4 6.4 6.2  NEUTROABS 7.4  --   --   --   --   --  4.1  HGB 11.3* 10.9*   < > 9.6* 9.6* 9.4* 9.1*  HCT 35.4* 33.1*   < > 30.1* 29.1* 28.9* 27.7*  MCV 87.0 83.2  --  85.0 82.9 83.8 82.2  PLT 236 216  --  159 182  166 190   < > = values in this interval not displayed.   Blood Culture    Component Value Date/Time   SDES BLOOD RIGHT HAND 07/18/2024 1022   SDES BLOOD RIGHT ARM 07/18/2024 1022   SPECREQUEST  07/18/2024 1022    BOTTLES DRAWN AEROBIC AND ANAEROBIC Blood Culture adequate volume   SPECREQUEST  07/18/2024 1022    BOTTLES DRAWN AEROBIC AND ANAEROBIC Blood Culture adequate volume   CULT  07/18/2024 1022    NO GROWTH < 24 HOURS Performed at [facility], [address], [place], [place] [address]    CULT  07/18/2024 1022    NO GROWTH < 24 HOURS Performed at [facility], [address] [place], [place] [address]    REPTSTATUS PENDING 07/18/2024 1022   REPTSTATUS PENDING 07/18/2024 1022    Cardiac Enzymes: Recent Labs  Lab 07/14/24 0530  CKTOTAL 272   CBG: Recent Labs  Lab 07/18/24 1636  07/18/24 2210 07/19/24 0807 07/19/24 1053 07/19/24 1234  GLUCAP 94 188* 151* 151* 130*   Iron Studies: No results for input(s): IRON, TIBC, TRANSFERRIN, FERRITIN in the last 72 hours. Lab Results  Component Value Date   INR 1.2 10/24/2022   INR 1.1 08/11/2020   INR 1.1 05/09/2020   Studies/Results: DG Chest Port 1 View Result Date: 07/19/2024 CLINICAL DATA:  Status post dialysis catheter placement. EXAM: PORTABLE CHEST 1 VIEW COMPARISON:  July 15, 2023 FINDINGS: The heart size and mediastinal contours are within normal limits. Right internal jugular dialysis catheter is noted with distal tip in expected position of cavoatrial junction. Both lungs are clear. The visualized skeletal structures are unremarkable. IMPRESSION: Right internal jugular dialysis catheter is noted with distal tip in expected position of cavoatrial junction. Electronically Signed   By: Lynwood Landy Raddle M.D.   On: 07/19/2024 13:21   HYBRID OR IMAGING ([facility] ONLY) Result Date: 07/19/2024 There is no interpretation for this exam.  This order is for images obtained during a surgical procedure.  Please  See Surgeries Tab for more information regarding the procedure.   ECHOCARDIOGRAM COMPLETE BUBBLE STUDY Result Date: 07/18/2024    ECHOCARDIOGRAM REPORT   Patient Name:   [name] Date of Exam: 07/18/2024 Medical Rec #:  [id number]       Height:       72.0 in Accession #:    [id number]      Weight:       200.6 lb Date of Birth:  06/12/1986       BSA:          2.133 m Patient Age:    38 years        BP:           150/89 mmHg Patient Gender: M               HR:           77 bpm. Exam Location:  Inpatient Procedure: 2D Echo, Cardiac Doppler, Color Doppler and Saline Contrast Bubble            Study (Both Spectral and Color Flow Doppler were utilized during            procedure). Indications:    Bacteremia  History:        Patient has prior history of Echocardiogram examinations, most                 recent 10/28/2022. Risk Factors:Hypertension.  Sonographer:    Nathanel Devonshire Referring Phys: [id number] GREGORY D CALONE IMPRESSIONS  1. Left ventricular ejection fraction, by estimation, is 60 to 65%. The left ventricle has normal function. The left ventricle has no regional wall motion abnormalities. There is moderate concentric left ventricular hypertrophy. Left ventricular diastolic parameters are consistent with Grade II diastolic dysfunction (pseudonormalization).  2. Right ventricular systolic function is normal. The right ventricular size is normal.  3. Left atrial size was mildly dilated.  4. A small pericardial effusion is present. The pericardial effusion is posterior to the left ventricle. There is no evidence of cardiac tamponade.  5. There is a large rounded calcified mass (1.9x1.3cm) attached to the atrial surface of posterior leaflet/annulus of the mitral valve. The mitral valve is normal in structure. Trivial mitral valve regurgitation. No evidence of mitral stenosis.  6. The aortic valve is tricuspid. Aortic valve regurgitation is not visualized. Aortic valve sclerosis/calcification is present, without any  evidence of aortic stenosis.  7. The  inferior vena cava is dilated in size with >50% respiratory variability, suggesting right atrial pressure of 8 mmHg.  8. Agitated saline contrast bubble study was negative, with no evidence of any interatrial shunt. 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 In setting of bacteremia, this raises concern for vegetation. FINDINGS  Left Ventricle: Left ventricular ejection fraction, by estimation, is 60 to 65%. The left ventricle has normal function. The left ventricle has no regional wall motion abnormalities. The left ventricular internal cavity size was normal in size. There is  moderate concentric left ventricular hypertrophy. Left ventricular diastolic parameters are consistent with Grade II diastolic dysfunction (pseudonormalization). Right Ventricle: The right ventricular size is normal. No increase in right ventricular wall thickness. Right ventricular systolic function is normal. Left Atrium: Left atrial size was mildly dilated. Right Atrium: Right atrial size was normal in size. Pericardium: A small pericardial effusion is present. The pericardial effusion is posterior to the left ventricle. There is no evidence of cardiac tamponade. Mitral Valve: There is a large rounded calcified mass (1.9x1.3cm) attached to the atrial surface of posterior leaflet/annulus of the mitral valve. The mitral valve is normal in structure. Trivial mitral valve regurgitation. No evidence of mitral valve stenosis. MV peak gradient, 3.8 mmHg. The mean mitral valve gradient is 2.0 mmHg. Tricuspid Valve: The tricuspid valve is normal in structure. Tricuspid valve regurgitation is mild . No evidence of tricuspid stenosis. Aortic Valve: The aortic valve is tricuspid. Aortic valve regurgitation is not visualized. Aortic valve sclerosis/calcification is present, without any evidence of aortic stenosis. Aortic valve mean gradient measures 4.0 mmHg. Aortic valve peak  gradient measures 6.6 mmHg. Aortic valve area, by VTI measures 3.40 cm. Pulmonic Valve: The pulmonic valve was normal in structure. Pulmonic valve regurgitation is mild. No evidence of pulmonic stenosis. Aorta: The aortic root is normal in size and structure. Venous: The inferior vena cava is dilated in size with greater than 50% respiratory variability, suggesting right atrial pressure of 8 mmHg. IAS/Shunts: No atrial level shunt detected by color flow Doppler. Agitated saline contrast was given intravenously to evaluate for intracardiac shunting. Agitated saline contrast bubble study was negative, with no evidence of any interatrial shunt.  LEFT VENTRICLE PLAX 2D LVIDd:         4.60 cm      Diastology LVIDs:         3.10 cm      LV e' medial:    4.46 cm/s LV PW:         1.40 cm      LV E/e' medial:  24.9 LV IVS:        1.30 cm      LV e' lateral:   5.00 cm/s LVOT diam:     2.20 cm      LV E/e' lateral: 22.2 LV SV:         77 LV SV Index:   36 LVOT Area:     3.80 cm LV IVRT:       70 msec  LV Volumes (MOD) LV vol d, MOD A2C: 85.8 ml LV vol d, MOD A4C: 105.0 ml LV vol s, MOD A2C: 32.2 ml LV vol s, MOD A4C: 34.0 ml LV SV MOD A2C:     53.6 ml LV SV MOD A4C:     105.0 ml LV SV MOD BP:      62.5 ml RIGHT VENTRICLE          IVC RV Basal diam:  3.50  cm  IVC diam: 2.10 cm TAPSE (M-mode): 2.4 cm                          PULMONARY VEINS                          Diastolic Velocity: 45.00 cm/s                          S/D Velocity:       0.70                          Systolic Velocity:  32.60 cm/s LEFT ATRIUM             Index LA diam:        3.30 cm 1.55 cm/m LA Vol (A2C):   58.5 ml 27.42 ml/m LA Vol (A4C):   54.3 ml 25.45 ml/m LA Biplane Vol: 57.2 ml 26.81 ml/m  AORTIC VALVE AV Area (Vmax):    3.03 cm AV Area (Vmean):   2.92 cm AV Area (VTI):     3.40 cm AV Vmax:           128.00 cm/s AV Vmean:          87.700 cm/s AV VTI:            0.227 m AV Peak Grad:      6.6 mmHg AV Mean Grad:      4.0 mmHg LVOT Vmax:          102.00 cm/s LVOT Vmean:        67.300 cm/s LVOT VTI:          0.203 m LVOT/AV VTI ratio: 0.89  AORTA Ao Root diam: 3.30 cm Ao Asc diam:  2.80 cm MITRAL VALVE MV Area (PHT): 4.86 cm     SHUNTS MV Area VTI:   3.00 cm     Systemic VTI:  0.20 m MV Peak grad:  3.8 mmHg     Systemic Diam: 2.20 cm MV Mean grad:  2.0 mmHg MV Vmax:       0.98 m/s MV Vmean:      59.6 cm/s MV Decel Time: 156 msec MV E velocity: 111.00 cm/s MV A velocity: 104.00 cm/s MV E/A ratio:  1.07 Toribio Fuel MD Electronically signed by Toribio Fuel MD Signature Date/Time: 07/18/2024/3:30:32 PM    Final     Medications:  ceFAZolin       ceFAZolin  (ANCEF ) IV 1 g (07/18/24 1725)    atorvastatin   40 mg Oral QPC supper   Chlorhexidine  Gluconate Cloth  6 each Topical Q0600   cloNIDine   0.1 mg Oral BID   heparin  injection (subcutaneous)  5,000 Units Subcutaneous Q8H   insulin  aspart  0-6 Units Subcutaneous TID WC   insulin  glargine-yfgn  7 Units Subcutaneous QHS   labetalol   200 mg Oral BID   minoxidil   2.5 mg Oral BID   pantoprazole   40 mg Oral BID   sucralfate   1 g Oral QID    Dialysis Orders: TTS - [facility] 4hr, 400/500, EDW 92kg, 1K/2.5Ca bath, TDC, heparin  1000 + 1000/hr - Mircera 30mcg IV q 2 weeks - no VDRA  Assessment/Plan: Staph aureus bacteremia: Blood Cx 1/4 positive, on Cefazolin . WBC high. TDC removed 1/6 for line holiday. Abd pain with N/V: ?Related to #1, CTA without mesenteric ischemia. On PPI +  sucralfate . Pain meds and anti-emetics per primary team. ESRD: Usual TTS schedule, s/p HD 1/5, completed line holiday, and new TDC placed today. Plan for HD tonight. Current AVF cannot be used until March.  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 Secondary HPTH: Ca low end - follow. T2DM: Per primary.  Charmaine Piety, NP [facility] 07/19/2024,3:21 PM  LOS: 4 days    "

## 2024-07-19 NOTE — Anesthesia Postprocedure Evaluation (Signed)
"   Anesthesia Post Note  Patient: [name]  Procedure(s) Performed: INSERTION OF DIALYSIS CATHETER (Right: Chest) ULTRASOUND GUIDANCE, FOR VASCULAR ACCESS (Right: Neck)     Patient location during evaluation: PACU Anesthesia Type: General Level of consciousness: awake and alert Pain management: pain level controlled Vital Signs Assessment: post-procedure vital signs reviewed and stable Respiratory status: spontaneous breathing, nonlabored ventilation, respiratory function stable and patient connected to nasal cannula oxygen  Cardiovascular status: blood pressure returned to baseline and stable Postop Assessment: no apparent nausea or vomiting Anesthetic complications: no   No notable events documented.  Last Vitals:  Vitals:   07/19/24 1315 07/19/24 1333  BP: (!) 154/93 (!) 152/83  Pulse: 78 80  Resp: 14 18  Temp: 36.7 C 36.6 C  SpO2: 100% 100%    Last Pain:  Vitals:   07/19/24 1315  TempSrc:   PainSc: 3                  Lynwood MARLA Cornea      "

## 2024-07-19 NOTE — Progress Notes (Signed)
" ° °  [facility] has been requested to perform a transesophageal echocardiogram on [name] for bacteremia.     The patient does NOT have any absolute or relative contraindications to a Transesophageal Echocardiogram (TEE).  The patient has hx of LA Grade D esophagitis with no bleeding. Non bleeding Mallory Weiss tear. Acute gastritis. From EGD on 08/04/23.     After careful review of history and examination, the risks and benefits of transesophageal echocardiogram have been explained including risks of esophageal damage, perforation (1:10,000 risk), bleeding, pharyngeal hematoma as well as other potential complications associated with conscious sedation including aspiration, arrhythmia, respiratory failure and death. Alternatives to treatment were discussed, questions were answered. Patient is willing to proceed.   NPO after midnight. TEE scheduled tomorrow.     SignedShirl Fruits, NP  07/19/2024 3:17 PM   "

## 2024-07-19 NOTE — Op Note (Signed)
" ° ° °  Patient name: [name] MRN: [id number] DOB: 1986/03/01 Sex: male  07/19/2024 Pre-operative Diagnosis: ESRD on HD Post-operative diagnosis:  Same Surgeon:  Norman GORMAN Serve, MD Procedure Performed:  Ultrasound and fluoroscopic guided tunneled dialysis catheter placement  Indications: [name] is a 39 year old male with ESRD who presented with bacteremia.  His TDC was removed at the bedside 2 days ago and IV antibiotics was continued.  His blood cultures have cleared and now nephrology is requesting replacement of his tunneled dialysis cath.  Risks and benefits were reviewed and he elected to proceed.  Findings:  Patent and compressible right IJ. Successful placement of tunneled dialysis catheter in atriocaval junction   Procedure:   The patient was brought to the operating room positioned supine on operating room table.  The neck was prepped and draped in the usual sterile fashion.  Anesthesia was induced, preoperative antibiotics were administered and a timeout was performed. Using ultrasound guidance the right internal jugular vein was accessed with micropuncture technique.  Through the micropuncture sheath, the guidewire was advanced into the superior vena cava.  A small incision was made around the skin access point.  The access point was serially dilated under direct fluoroscopic guidance.  A peel-away sheath was introduced into the superior vena cava under fluoroscopic guidance.  A counterincision was made in the chest under the clavicle.  A 19 cm tunnel dialysis catheter was then tunneled under the skin, over the clavicle into the incision in the neck.  The tunneling device was removed and the catheter fed through the peel-away sheath into the superior vena cava.  The peel-away sheath was removed and the catheter gently pulled back.  Adequate position was confirmed with x-ray.  The catheter was tested and found to aspirate and flush with ease.    The catheter was sutured to the skin  and the neck incision was closed with 4-0 Monocryl.  The catheter was then capped and heparin  locked.   Norman GORMAN Serve MD [facility] Office: [phone]  "

## 2024-07-19 NOTE — Progress Notes (Signed)
" °  Progress Note    07/19/2024 10:46 AM * Day of Surgery *  Subjective:  no complaints   Vitals:   07/19/24 0840 07/19/24 0841  BP: (!) 146/92 (!) 146/92  Pulse:  82  Resp:  18  Temp:  98.6 F (37 C)  SpO2:  100%   Physical Exam: Cardiac:  regular Lungs:  non labored Extremities: left AV fistula with good thrill. Incision healing well Abdomen: none Neurologic: alert and oriented  CBC    Component Value Date/Time   WBC 6.2 07/19/2024 0618   RBC 3.37 (L) 07/19/2024 0618   HGB 9.1 (L) 07/19/2024 0618   HGB 8.1 (L) 09/16/2020 1356   HCT 27.7 (L) 07/19/2024 0618   HCT 26.5 (L) 09/16/2020 1356   PLT 190 07/19/2024 0618   PLT 272 09/16/2020 1356   MCV 82.2 07/19/2024 0618   MCV 86 09/16/2020 1356   MCH 27.0 07/19/2024 0618   MCHC 32.9 07/19/2024 0618   RDW 15.8 (H) 07/19/2024 0618   RDW 15.3 09/16/2020 1356   LYMPHSABS 1.0 07/19/2024 0618   MONOABS 0.9 07/19/2024 0618   EOSABS 0.2 07/19/2024 0618   BASOSABS 0.0 07/19/2024 0618    BMET    Component Value Date/Time   NA 128 (L) 07/19/2024 0618   NA 143 09/16/2020 1356   K 4.0 07/19/2024 0618   CL 86 (L) 07/19/2024 0618   CO2 20 (L) 07/19/2024 0618   GLUCOSE 155 (H) 07/19/2024 0618   BUN 72 (H) 07/19/2024 0618   BUN 40 (H) 09/16/2020 1356   CREATININE 16.30 (H) 07/19/2024 0618   CALCIUM  7.3 (L) 07/19/2024 0618   CALCIUM  7.2 (L) 08/08/2020 0534   GFRNONAA 3 (L) 07/19/2024 0618   GFRAA 19 (L) 03/10/2020 1410    INR    Component Value Date/Time   INR 1.2 10/24/2022 1428    No intake or output data in the 24 hours ending 07/19/24 1046    Assessment/Plan:  39 y.o. male with ESRD with bacteremia  TDC pulled 2 days ago for line holiday Plan is to place new [facility] today in OR   Norman GORMAN Serve, PA-C [facility] [phone] 07/19/2024 10:46 AM "

## 2024-07-19 NOTE — Plan of Care (Signed)
   Problem: Health Behavior/Discharge Planning: Goal: Ability to manage health-related needs will improve Outcome: Progressing   Problem: Nutritional: Goal: Maintenance of adequate nutrition will improve Outcome: Progressing   Problem: Skin Integrity: Goal: Risk for impaired skin integrity will decrease Outcome: Progressing   Problem: Education: Goal: Knowledge of General Education information will improve Description: Including pain rating scale, medication(s)/side effects and non-pharmacologic comfort measures Outcome: Progressing

## 2024-07-19 NOTE — H&P (View-Only) (Signed)
" ° °  [facility] has been requested to perform a transesophageal echocardiogram on [name] for bacteremia.     The patient does NOT have any absolute or relative contraindications to a Transesophageal Echocardiogram (TEE).  The patient has hx of LA Grade D esophagitis with no bleeding. Non bleeding Mallory Weiss tear. Acute gastritis. From EGD on 08/04/23.     After careful review of history and examination, the risks and benefits of transesophageal echocardiogram have been explained including risks of esophageal damage, perforation (1:10,000 risk), bleeding, pharyngeal hematoma as well as other potential complications associated with conscious sedation including aspiration, arrhythmia, respiratory failure and death. Alternatives to treatment were discussed, questions were answered. Patient is willing to proceed.   NPO after midnight. TEE scheduled tomorrow.     SignedShirl Fruits, NP  07/19/2024 3:17 PM   "

## 2024-07-19 NOTE — Transfer of Care (Signed)
 Immediate Anesthesia Transfer of Care Note  Patient: [name]  Procedure(s) Performed: INSERTION OF DIALYSIS CATHETER (Right: Chest) ULTRASOUND GUIDANCE, FOR VASCULAR ACCESS (Right: Neck)  Patient Location: PACU  Anesthesia Type:General  Level of Consciousness: awake, alert , and oriented  Airway & Oxygen  Therapy: Patient Spontanous Breathing  Post-op Assessment: Report given to RN and Post -op Vital signs reviewed and stable  Post vital signs: Reviewed and stable  Last Vitals:  Vitals Value Taken Time  BP 140/93 07/19/24 12:31  Temp    Pulse 79 07/19/24 12:33  Resp 15 07/19/24 12:33  SpO2 99 % 07/19/24 12:33  Vitals shown include unfiled device data.  Last Pain:  Vitals:   07/19/24 0849  TempSrc:   PainSc: 0-No pain      Patients Stated Pain Goal: 4 (07/16/24 0837)  Complications: No notable events documented.

## 2024-07-19 NOTE — Progress Notes (Signed)
 " PROGRESS NOTE  MAYANK TEUSCHER FMW:[id number] DOB: 1986/05/02 DOA: 07/15/2024 PCP: Jaycee Greig PARAS, NP   LOS: 4 days   Brief narrative:   ROWDY GUERRINI is a 39 y.o. male with medical history significant of hypertension, hyperlipidemia, diastolic CHF, diabetes mellitus type 2,  ESRD on hemodialysis (TTS), and GERD with prior presents with persistent vomiting and abdominal pain.  In the ED patient was slightly tachycardic tachypneic and had low-grade fever with elevated blood pressure.  Chest x-ray without any infiltrate.   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  Patient had been given antiemetics, fentanyl  IV for pain, and Protonix  40 mg IV and was admitted to the hospital for further evaluation and treatment.   Assessment/Plan: Principal Problem:   Intractable nausea and vomiting Active Problems:   Abdominal pain   Leukocytosis   Hypertensive urgency   Anemia of chronic disease   Prolonged QT interval   ESRD (end stage renal disease) on dialysis (HCC)   Insulin  dependent type 1 diabetes mellitus (HCC)   Chronic diastolic CHF (congestive heart failure) (HCC)   GERD (gastroesophageal reflux disease)   ESRD (end stage renal disease) (HCC)  Intractable nausea and vomiting Abdominal pain Improved at this time.    Has been advanced to regular diet today from soft diet.  As tolerated.  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 History of EGD LA grade D esophagitis, Mallory-Weiss tear, gastric polyp, and gastritis.  Continue Protonix  40 milligram twice daily, continue Carafate .  CTA showing no significant stenosis or sign of obstruction.  Continue supportive care scopolamine  patch.  Flu COVID and influenza was negative.    Reinforced against narcotics if possible.  Staphylococcus aureus bacteremia on blood cultures.  Repeat blood cultures have been ordered after hemodialysis catheter line removal.  Status post new hemodialysis catheter placement 07/19/2024.  ID on board.  Follow ID recommendation.  Tentative plan for TEE on Friday, 07/20/2024.  Leukocytosis Improved at this time.   Hypertensive urgency Continue clonidine  labetalol  and minoxidil ,.  Continue to monitor closely.   Anemia of chronic kidney disease Latest hemoglobin of 9.1 from initial 9.6.  Will monitor.  ESRD on HD On Tuesday Thursday Saturday schedule.  Hemodialysis as per nephrology.  Hyponatremia.  Patient is on hemodialysis.  Will continue to monitor.  Latest sodium of 128   Insulin -dependent diabetes mellitus  Recent hemoglobin A1c of 6.5  on 06/18/2024.  Continue Semglee  at night and sliding scale insulin .  Overall glycemic control adequate.  Latest POC of 130.   Diastolic congestive heart failure Compensated at this time.  Review of last echocardiogram with LV EF to be 60 to 65% with grade 3 diastolic dysfunction on 10/2022.  Continue f follow-up management with dialysis.  DVT prophylaxis: heparin  injection 5,000 Units Start: 07/19/24 1530 SCDs Start: 07/15/24 0801   Disposition: Home likely in 2 to 3 days, pending TEE, blood cultures.  Status is: Inpatient Remains inpatient appropriate because: Pending clinical improvement, IV antibiotic, pending further workup    Code Status:     Code Status: Full Code  Family Communication: Spoke with the patient's aunt at bedside on 07/16/2024  Consultants: Nephrology Infectious disease  Procedures:  Hemodialysis Right internal jugular hemodialysis catheter removal on 07/17/2024 Right internal jugular hemodialysis catheter placement on 07/19/2024  Anti-infectives:  Cefazolin  IV  Anti-infectives (From admission, onward)    Start     Dose/Rate Route Frequency Ordered Stop   07/19/24  1127  ceFAZolin  (ANCEF ) 2-4 GM/100ML-% IVPB       Note to Pharmacy: Chauncey Rubin L: cabinet override      07/19/24 1127 07/19/24 2344   07/15/24 1815  ceFAZolin  (ANCEF ) IVPB 1 g/50 mL premix        1 g 100 mL/hr over 30 Minutes Intravenous Every 24 hours 07/15/24 1805         Subjective: Today, patient was seen and examined at bedside.  Wants to try more food today.  Seen after hemodialysis catheter placement.  Denies pain nausea vomiting fever or chills.     Objective: Vitals:   07/19/24 1315 07/19/24 1333  BP: (!) 154/93 (!) 152/83  Pulse: 78 80  Resp: 14 18  Temp: 98.1 F (36.7 C) 97.9 F (36.6 C)  SpO2: 100% 100%    Intake/Output Summary (Last 24 hours) at 07/19/2024 1526 Last data filed at 07/19/2024 1216 Gross per 24 hour  Intake 300 ml  Output 5 ml  Net 295 ml   Filed Weights   07/15/24 0155 07/16/24 1449 07/16/24 1840  Weight: 90.7 kg 93 kg 91 kg   Body mass index is 27.21 kg/m.   Physical Exam:  General:  Average built, not in obvious distress, legally blind, HENT:   No scleral pallor or icterus noted. Oral mucosa is moist.  Right internal jugular hemodialysis catheter in place. Chest:  Clear breath sounds.  Diminished breath sounds bilaterally. No crackles or wheezes.  CVS: S1 &S2 heard. No murmur.  Regular rate and rhythm. Abdomen: Soft, nontender, nondistended.  Bowel sounds are heard.   Extremities: No cyanosis, clubbing or edema.  Left upper extremity AV fistula in place Psych: Alert, awake and oriented, normal mood CNS: Legally blind, power equal in all extremities.   Skin: Warm and dry skin with pigmentation.   Data Review: I have personally reviewed the following laboratory data and studies,  CBC: Recent Labs  Lab 07/14/24 0530 07/15/24 0218 07/15/24 0910 07/16/24 0427 07/17/24 0446 07/18/24 0429 07/19/24 0618  WBC 8.4 12.4*  --  12.8* 7.4 6.4 6.2  NEUTROABS 7.4  --   --   --   --   --  4.1  HGB 11.3* 10.9* 9.5* 9.6* 9.6* 9.4* 9.1*   HCT 35.4* 33.1* 29.5* 30.1* 29.1* 28.9* 27.7*  MCV 87.0 83.2  --  85.0 82.9 83.8 82.2  PLT 236 216  --  159 182 166 190   Basic Metabolic Panel: Recent Labs  Lab 07/14/24 0530 07/15/24 0218 07/16/24 0427 07/17/24 0446 07/18/24 0429 07/19/24 0618  NA 138 135 132* 131* 129* 128*  K 4.1 3.7 4.7 3.5 3.6 4.0  CL 94* 90* 87* 87* 87* 86*  CO2 25 23 21* 26 24 20*  GLUCOSE 142* 172* 159* 122* 130* 155*  BUN 49* 66* 83* 50* 59* 72*  CREATININE 12.10* 14.80* 17.40* 12.20* 14.50* 16.30*  CALCIUM  7.8* 7.9* 7.1* 7.7* 7.2* 7.3*  MG 2.0  --  2.1 2.2 2.2  --   PHOS  --   --   --   --   --  5.6*   Liver Function Tests: Recent Labs  Lab 07/14/24 0530 07/15/24 0218 07/19/24 0618  AST 16 24  --   ALT 10 17  --   ALKPHOS 72 64  --   BILITOT 0.4 0.4  --  PROT 8.2* 8.2*  --   ALBUMIN 4.4 4.3 3.6   Recent Labs  Lab 07/15/24 0218  LIPASE 13   No results for input(s): AMMONIA in the last 168 hours. Cardiac Enzymes: Recent Labs  Lab 07/14/24 0530  CKTOTAL 272   BNP (last 3 results) Recent Labs    08/02/23 1120  BNP 3,974.0*    ProBNP (last 3 results) No results for input(s): PROBNP in the last 8760 hours.  CBG: Recent Labs  Lab 07/18/24 1636 07/18/24 2210 07/19/24 0807 07/19/24 1053 07/19/24 1234  GLUCAP 94 188* 151* 151* 130*   Recent Results (from the past 240 hours)  Resp panel by RT-PCR (RSV, Flu A&B, Covid) Anterior Nasal Swab     Status: None   Collection Time: 07/14/24  5:28 AM   Specimen: Anterior Nasal Swab  Result Value Ref Range Status   SARS Coronavirus 2 by RT PCR NEGATIVE NEGATIVE Final   Influenza A by PCR NEGATIVE NEGATIVE Final   Influenza B by PCR NEGATIVE NEGATIVE Final    Comment: (NOTE) The Xpert Xpress SARS-CoV-2/FLU/RSV plus assay is intended as an aid in the diagnosis of influenza from Nasopharyngeal swab specimens and should not be used as a sole basis for treatment. Nasal washings and aspirates are unacceptable for Xpert Xpress  SARS-CoV-2/FLU/RSV testing.  Fact Sheet for Patients: bloggercourse.com  Fact Sheet for Healthcare Providers: seriousbroker.it  This test is not yet approved or cleared by the [place]  FDA and has been authorized for detection and/or diagnosis of SARS-CoV-2 by FDA under an Emergency Use Authorization (EUA). This EUA will remain in effect (meaning this test can be used) for the duration of the COVID-19 declaration under Section 564(b)(1) of the Act, 21 U.S.C. section 360bbb-3(b)(1), unless the authorization is terminated or revoked.     Resp Syncytial Virus by PCR NEGATIVE NEGATIVE Final    Comment: (NOTE) Fact Sheet for Patients: bloggercourse.com  Fact Sheet for Healthcare Providers: seriousbroker.it  This test is not yet approved or cleared by the [place]  FDA and has been authorized for detection and/or diagnosis of SARS-CoV-2 by FDA under an Emergency Use Authorization (EUA). This EUA will remain in effect (meaning this test can be used) for the duration of the COVID-19 declaration under Section 564(b)(1) of the Act, 21 U.S.C. section 360bbb-3(b)(1), unless the authorization is terminated or revoked.  Performed at [facility], [address] [place], [place] [address]   Culture, blood (routine x 2)     Status: Abnormal   Collection Time: 07/15/24  3:29 AM   Specimen: BLOOD  Result Value Ref Range Status   Specimen Description BLOOD SITE NOT SPECIFIED  Final   Special Requests   Final    BOTTLES DRAWN AEROBIC AND ANAEROBIC Blood Culture results may not be optimal due to an inadequate volume of blood received in culture bottles   Culture  Setup Time   Final    GRAM POSITIVE COCCI IN CLUSTERS IN BOTH AEROBIC AND ANAEROBIC BOTTLES CRITICAL RESULT CALLED TO, READ BACK BY AND VERIFIED WITH: PHARMD ERIN WILLIAMSON [id number] AT 1759 BY EC Performed at [facility], [address], [place], [place] [address]    Culture STAPHYLOCOCCUS AUREUS (A)  Final   Report Status 07/17/2024 FINAL  Final   Organism ID, Bacteria STAPHYLOCOCCUS AUREUS  Final      Susceptibility   Staphylococcus aureus - MIC*    CIPROFLOXACIN  <=0.5 SENSITIVE Sensitive     ERYTHROMYCIN <=0.25 SENSITIVE Sensitive  GENTAMICIN <=0.5 SENSITIVE Sensitive     OXACILLIN 0.5 SENSITIVE Sensitive     TETRACYCLINE <=1 SENSITIVE Sensitive     VANCOMYCIN  1 SENSITIVE Sensitive     TRIMETH /SULFA  <=10 SENSITIVE Sensitive     CLINDAMYCIN <=0.25 SENSITIVE Sensitive     RIFAMPIN <=0.5 SENSITIVE Sensitive     Inducible Clindamycin NEGATIVE Sensitive     LINEZOLID 2 SENSITIVE Sensitive     * STAPHYLOCOCCUS AUREUS  Blood Culture ID Panel (Reflexed)     Status: Abnormal   Collection Time: 07/15/24  3:29 AM  Result Value Ref Range Status   Enterococcus faecalis NOT DETECTED NOT DETECTED Final   Enterococcus Faecium NOT DETECTED NOT DETECTED Final   Listeria monocytogenes NOT DETECTED NOT DETECTED Final   Staphylococcus species DETECTED (A) NOT DETECTED Final    Comment: CRITICAL RESULT CALLED TO, READ BACK BY AND VERIFIED WITH: PHARMD ERIN WILLIAMS ON [id number] AT 1759 BY EC    Staphylococcus aureus (BCID) DETECTED (A) NOT DETECTED Final    Comment: CRITICAL RESULT CALLED TO, READ BACK BY AND VERIFIED WITH: PHARMD ROCKY SLADE [id number] AT 1759 BY EC    Staphylococcus epidermidis NOT DETECTED NOT DETECTED Final   Staphylococcus lugdunensis NOT DETECTED NOT DETECTED Final   Streptococcus species NOT DETECTED NOT DETECTED Final   Streptococcus agalactiae NOT DETECTED NOT DETECTED Final   Streptococcus pneumoniae NOT DETECTED NOT DETECTED Final   Streptococcus pyogenes NOT DETECTED NOT DETECTED Final   A.calcoaceticus-baumannii NOT DETECTED NOT DETECTED Final   Bacteroides fragilis NOT DETECTED NOT DETECTED Final   Enterobacterales NOT DETECTED NOT DETECTED Final   Enterobacter  cloacae complex NOT DETECTED NOT DETECTED Final   Escherichia coli NOT DETECTED NOT DETECTED Final   Klebsiella aerogenes NOT DETECTED NOT DETECTED Final   Klebsiella oxytoca NOT DETECTED NOT DETECTED Final   Klebsiella pneumoniae NOT DETECTED NOT DETECTED Final   Proteus species NOT DETECTED NOT DETECTED Final   Salmonella species NOT DETECTED NOT DETECTED Final   Serratia marcescens NOT DETECTED NOT DETECTED Final   Haemophilus influenzae NOT DETECTED NOT DETECTED Final   Neisseria meningitidis NOT DETECTED NOT DETECTED Final   Pseudomonas aeruginosa NOT DETECTED NOT DETECTED Final   Stenotrophomonas maltophilia NOT DETECTED NOT DETECTED Final   Candida albicans NOT DETECTED NOT DETECTED Final   Candida auris NOT DETECTED NOT DETECTED Final   Candida glabrata NOT DETECTED NOT DETECTED Final   Candida krusei NOT DETECTED NOT DETECTED Final   Candida parapsilosis NOT DETECTED NOT DETECTED Final   Candida tropicalis NOT DETECTED NOT DETECTED Final   Cryptococcus neoformans/gattii NOT DETECTED NOT DETECTED Final   Meth resistant mecA/C and MREJ NOT DETECTED NOT DETECTED Final    Comment: Performed at [facility], [address], [place], [place] [address]  Respiratory (~20 pathogens) panel by PCR     Status: None   Collection Time: 07/15/24  8:03 AM   Specimen: Nasopharyngeal Swab; Respiratory  Result Value Ref Range Status   Adenovirus NOT DETECTED NOT DETECTED Final   Coronavirus 229E NOT DETECTED NOT DETECTED Final    Comment: (NOTE) The Coronavirus on the Respiratory Panel, DOES NOT test for the novel  Coronavirus (2019 nCoV)    Coronavirus HKU1 NOT DETECTED NOT DETECTED Final   Coronavirus NL63 NOT DETECTED NOT DETECTED Final   Coronavirus OC43 NOT DETECTED NOT DETECTED Final   Metapneumovirus NOT DETECTED NOT DETECTED Final   Rhinovirus / Enterovirus NOT DETECTED NOT DETECTED Final   Influenza A NOT DETECTED NOT DETECTED  Final   Influenza B NOT DETECTED NOT DETECTED  Final   Parainfluenza Virus 1 NOT DETECTED NOT DETECTED Final   Parainfluenza Virus 2 NOT DETECTED NOT DETECTED Final   Parainfluenza Virus 3 NOT DETECTED NOT DETECTED Final   Parainfluenza Virus 4 NOT DETECTED NOT DETECTED Final   Respiratory Syncytial Virus NOT DETECTED NOT DETECTED Final   Bordetella pertussis NOT DETECTED NOT DETECTED Final   Bordetella Parapertussis NOT DETECTED NOT DETECTED Final   Chlamydophila pneumoniae NOT DETECTED NOT DETECTED Final   Mycoplasma pneumoniae NOT DETECTED NOT DETECTED Final    Comment: Performed at [facility], [address] [place], [place] [address]  Culture, blood (routine x 2)     Status: Abnormal   Collection Time: 07/15/24  3:14 PM   Specimen: BLOOD RIGHT HAND  Result Value Ref Range Status   Specimen Description BLOOD RIGHT HAND  Final   Special Requests   Final    BOTTLES DRAWN AEROBIC AND ANAEROBIC Blood Culture results may not be optimal due to an inadequate volume of blood received in culture bottles   Culture  Setup Time   Final    GRAM POSITIVE COCCI IN BOTH AEROBIC AND ANAEROBIC BOTTLES CRITICAL VALUE NOTED.  VALUE IS CONSISTENT WITH PREVIOUSLY REPORTED AND CALLED VALUE.    Culture (A)  Final    STAPHYLOCOCCUS AUREUS SUSCEPTIBILITIES PERFORMED ON PREVIOUS CULTURE WITHIN THE LAST 5 DAYS. Performed at [facility], [address], [place], [place] [address]    Report Status 07/18/2024 FINAL  Final  Culture, blood (Routine X 2) w Reflex to ID Panel     Status: None (Preliminary result)   Collection Time: 07/18/24 10:22 AM   Specimen: BLOOD RIGHT HAND  Result Value Ref Range Status   Specimen Description BLOOD RIGHT HAND  Final   Special Requests   Final    BOTTLES DRAWN AEROBIC AND ANAEROBIC Blood Culture adequate volume   Culture   Final    NO GROWTH < 24 HOURS Performed at [facility], [address] [place], [place] [address]    Report Status PENDING  Incomplete  Culture, blood (Routine X 2) w  Reflex to ID Panel     Status: None (Preliminary result)   Collection Time: 07/18/24 10:22 AM   Specimen: BLOOD RIGHT ARM  Result Value Ref Range Status   Specimen Description BLOOD RIGHT ARM  Final   Special Requests   Final    BOTTLES DRAWN AEROBIC AND ANAEROBIC Blood Culture adequate volume   Culture   Final    NO GROWTH < 24 HOURS Performed at [facility], [address], [place], [place] [address]    Report Status PENDING  Incomplete     Studies: DG Chest Port 1 View Result Date: 07/19/2024 CLINICAL DATA:  Status post dialysis catheter placement. EXAM: PORTABLE CHEST 1 VIEW COMPARISON:  July 15, 2023 FINDINGS: The heart size and mediastinal contours are within normal limits. Right internal jugular dialysis catheter is noted with distal tip in expected position of cavoatrial junction. Both lungs are clear. The visualized skeletal structures are unremarkable. IMPRESSION: Right internal jugular dialysis catheter is noted with distal tip in expected position of cavoatrial junction. Electronically Signed   By: Lynwood Landy Raddle M.D.   On: 07/19/2024 13:21   HYBRID OR IMAGING ([facility] ONLY) Result Date: 07/19/2024 There is no interpretation for this exam.  This order is for images obtained during a surgical procedure.  Please See  Surgeries Tab for more information regarding the procedure.   ECHOCARDIOGRAM COMPLETE BUBBLE STUDY Result Date: 07/18/2024    ECHOCARDIOGRAM REPORT   Patient Name:   [name] Date of Exam: 07/18/2024 Medical Rec #:  [id number]       Height:       72.0 in Accession #:    [id number]      Weight:       200.6 lb Date of Birth:  09-02-1985       BSA:          2.133 m Patient Age:    38 years        BP:           150/89 mmHg Patient Gender: M               HR:           77 bpm. Exam Location:  Inpatient Procedure: 2D Echo, Cardiac Doppler, Color Doppler and Saline Contrast Bubble            Study (Both Spectral and Color Flow Doppler were utilized during             procedure). Indications:    Bacteremia  History:        Patient has prior history of Echocardiogram examinations, most                 recent 10/28/2022. Risk Factors:Hypertension.  Sonographer:    Nathanel Devonshire Referring Phys: [id number] GREGORY D CALONE IMPRESSIONS  1. Left ventricular ejection fraction, by estimation, is 60 to 65%. The left ventricle has normal function. The left ventricle has no regional wall motion abnormalities. There is moderate concentric left ventricular hypertrophy. Left ventricular diastolic parameters are consistent with Grade II diastolic dysfunction (pseudonormalization).  2. Right ventricular systolic function is normal. The right ventricular size is normal.  3. Left atrial size was mildly dilated.  4. A small pericardial effusion is present. The pericardial effusion is posterior to the left ventricle. There is no evidence of cardiac tamponade.  5. There is a large rounded calcified mass (1.9x1.3cm) attached to the atrial surface of posterior leaflet/annulus of the mitral valve. The mitral valve is normal in structure. Trivial mitral valve regurgitation. No evidence of mitral stenosis.  6. The aortic valve is tricuspid. Aortic valve regurgitation is not visualized. Aortic valve sclerosis/calcification is present, without any evidence of aortic stenosis.  7. The inferior vena cava is dilated in size with >50% respiratory variability, suggesting right atrial pressure of 8 mmHg.  8. Agitated saline contrast bubble study was negative, with no evidence of any interatrial shunt. 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 In setting of bacteremia, this raises concern for vegetation. FINDINGS  Left Ventricle: Left ventricular ejection fraction, by estimation, is 60 to 65%. The left ventricle has normal function. The left ventricle has no regional wall motion abnormalities. The left ventricular internal cavity size was normal in size. There is  moderate concentric  left ventricular hypertrophy. Left ventricular diastolic parameters are consistent with Grade II diastolic dysfunction (pseudonormalization). Right Ventricle: The right ventricular size is normal. No increase in right ventricular wall thickness. Right ventricular systolic function is normal. Left Atrium: Left atrial size was mildly dilated. Right Atrium: Right atrial size was normal in size. Pericardium: A small pericardial effusion is present. The pericardial effusion is posterior to the left ventricle. There is no evidence of cardiac tamponade. Mitral Valve: There is a large rounded calcified mass (1.9x1.3cm)  attached to the atrial surface of posterior leaflet/annulus of the mitral valve. The mitral valve is normal in structure. Trivial mitral valve regurgitation. No evidence of mitral valve stenosis. MV peak gradient, 3.8 mmHg. The mean mitral valve gradient is 2.0 mmHg. Tricuspid Valve: The tricuspid valve is normal in structure. Tricuspid valve regurgitation is mild . No evidence of tricuspid stenosis. Aortic Valve: The aortic valve is tricuspid. Aortic valve regurgitation is not visualized. Aortic valve sclerosis/calcification is present, without any evidence of aortic stenosis. Aortic valve mean gradient measures 4.0 mmHg. Aortic valve peak gradient measures 6.6 mmHg. Aortic valve area, by VTI measures 3.40 cm. Pulmonic Valve: The pulmonic valve was normal in structure. Pulmonic valve regurgitation is mild. No evidence of pulmonic stenosis. Aorta: The aortic root is normal in size and structure. Venous: The inferior vena cava is dilated in size with greater than 50% respiratory variability, suggesting right atrial pressure of 8 mmHg. IAS/Shunts: No atrial level shunt detected by color flow Doppler. Agitated saline contrast was given intravenously to evaluate for intracardiac shunting. Agitated saline contrast bubble study was negative, with no evidence of any interatrial shunt.  LEFT VENTRICLE PLAX 2D LVIDd:          4.60 cm      Diastology LVIDs:         3.10 cm      LV e' medial:    4.46 cm/s LV PW:         1.40 cm      LV E/e' medial:  24.9 LV IVS:        1.30 cm      LV e' lateral:   5.00 cm/s LVOT diam:     2.20 cm      LV E/e' lateral: 22.2 LV SV:         77 LV SV Index:   36 LVOT Area:     3.80 cm LV IVRT:       70 msec  LV Volumes (MOD) LV vol d, MOD A2C: 85.8 ml LV vol d, MOD A4C: 105.0 ml LV vol s, MOD A2C: 32.2 ml LV vol s, MOD A4C: 34.0 ml LV SV MOD A2C:     53.6 ml LV SV MOD A4C:     105.0 ml LV SV MOD BP:      62.5 ml RIGHT VENTRICLE          IVC RV Basal diam:  3.50 cm  IVC diam: 2.10 cm TAPSE (M-mode): 2.4 cm                          PULMONARY VEINS                          Diastolic Velocity: 45.00 cm/s                          S/D Velocity:       0.70                          Systolic Velocity:  32.60 cm/s LEFT ATRIUM             Index LA diam:        3.30 cm 1.55 cm/m LA Vol (A2C):   58.5 ml 27.42 ml/m LA Vol (A4C):   54.3 ml 25.45 ml/m LA Biplane Vol: 57.2 ml 26.81  ml/m  AORTIC VALVE AV Area (Vmax):    3.03 cm AV Area (Vmean):   2.92 cm AV Area (VTI):     3.40 cm AV Vmax:           128.00 cm/s AV Vmean:          87.700 cm/s AV VTI:            0.227 m AV Peak Grad:      6.6 mmHg AV Mean Grad:      4.0 mmHg LVOT Vmax:         102.00 cm/s LVOT Vmean:        67.300 cm/s LVOT VTI:          0.203 m LVOT/AV VTI ratio: 0.89  AORTA Ao Root diam: 3.30 cm Ao Asc diam:  2.80 cm MITRAL VALVE MV Area (PHT): 4.86 cm     SHUNTS MV Area VTI:   3.00 cm     Systemic VTI:  0.20 m MV Peak grad:  3.8 mmHg     Systemic Diam: 2.20 cm MV Mean grad:  2.0 mmHg MV Vmax:       0.98 m/s MV Vmean:      59.6 cm/s MV Decel Time: 156 msec MV E velocity: 111.00 cm/s MV A velocity: 104.00 cm/s MV E/A ratio:  1.07 Toribio Fuel MD Electronically signed by Toribio Fuel MD Signature Date/Time: 07/18/2024/3:30:32 PM    Final        Vernal Alstrom, MD  [facility] 07/19/2024  If 7PM-7AM, please contact  night-coverage             "

## 2024-07-19 NOTE — Anesthesia Preprocedure Evaluation (Addendum)
"                                    Anesthesia Evaluation  Patient identified by MRN, date of birth, ID band Patient awake    Reviewed: Allergy & Precautions, NPO status , Patient's Chart, lab work & pertinent test results, reviewed documented beta blocker date and time   History of Anesthesia Complications Negative for: history of anesthetic complications  Airway Mallampati: II  TM Distance: >3 FB     Dental no notable dental hx.    Pulmonary neg COPD   breath sounds clear to auscultation       Cardiovascular hypertension, (-) angina +CHF  (-) CAD and (-) Past MI  Rhythm:Regular Rate:Normal  1. Left ventricular ejection fraction, by estimation, is 60 to 65%. The  left ventricle has normal function. The left ventricle has no regional  wall motion abnormalities. There is moderate concentric left ventricular  hypertrophy. Left ventricular  diastolic parameters are consistent with Grade II diastolic dysfunction  (pseudonormalization).   2. Right ventricular systolic function is normal. The right ventricular  size is normal.   3. Left atrial size was mildly dilated.   4. A small pericardial effusion is present. The pericardial effusion is  posterior to the left ventricle. There is no evidence of cardiac  tamponade.   5. There is a large rounded calcified mass (1.9x1.3cm) attached to the  atrial surface of posterior leaflet/annulus of the mitral valve. The  mitral valve is normal in structure. Trivial mitral valve regurgitation.  No evidence of mitral stenosis.   6. The aortic valve is tricuspid. Aortic valve regurgitation is not  visualized. Aortic valve sclerosis/calcification is present, without any  evidence of aortic stenosis.   7. The inferior vena cava is dilated in size with >50% respiratory  variability, suggesting right atrial pressure of 8 mmHg.   8. Agitated saline contrast bubble study was negative, with no evidence  of any interatrial shunt.      Neuro/Psych Seizures -,  PSYCHIATRIC DISORDERS         GI/Hepatic ,GERD  ,,(+) neg Cirrhosis        Endo/Other  diabetes, Well Controlled, Type 1, Insulin  Dependent    Renal/GU ESRFRenal disease     Musculoskeletal   Abdominal   Peds  Hematology  (+) Blood dyscrasia, anemia   Anesthesia Other Findings   Reproductive/Obstetrics                              Anesthesia Physical Anesthesia Plan  ASA: 3  Anesthesia Plan: General   Post-op Pain Management:    Induction: Intravenous  PONV Risk Score and Plan: 2 and Ondansetron  and Dexamethasone   Airway Management Planned: Oral ETT  Additional Equipment:   Intra-op Plan:   Post-operative Plan: Extubation in OR  Informed Consent: I have reviewed the patients History and Physical, chart, labs and discussed the procedure including the risks, benefits and alternatives for the proposed anesthesia with the patient or authorized representative who has indicated his/her understanding and acceptance.     Dental advisory given  Plan Discussed with: CRNA  Anesthesia Plan Comments:         Anesthesia Quick Evaluation  "

## 2024-07-19 NOTE — Anesthesia Procedure Notes (Signed)
 Procedure Name: Intubation Date/Time: 07/19/2024 11:35 AM  Performed by: Delores Dus, CRNAPre-anesthesia Checklist: Patient identified, Emergency Drugs available, Suction available and Patient being monitored Patient Re-evaluated:Patient Re-evaluated prior to induction Oxygen  Delivery Method: Circle system utilized Preoxygenation: Pre-oxygenation with 100% oxygen  Induction Type: IV induction Laryngoscope Size: Mac and 3 Grade View: Grade I Tube type: Oral Tube size: 7.0 mm Number of attempts: 1 Airway Equipment and Method: Stylet Placement Confirmation: ETT inserted through vocal cords under direct vision, positive ETCO2 and breath sounds checked- equal and bilateral Secured at: 23 cm Tube secured with: Tape Dental Injury: Teeth and Oropharynx as per pre-operative assessment

## 2024-07-20 ENCOUNTER — Inpatient Hospital Stay (HOSPITAL_COMMUNITY)

## 2024-07-20 ENCOUNTER — Encounter (HOSPITAL_COMMUNITY): Payer: Self-pay | Admitting: Vascular Surgery

## 2024-07-20 ENCOUNTER — Encounter (HOSPITAL_COMMUNITY)
Admission: EM | Disposition: A | Discharge status: Home / Self Care | Payer: Self-pay | Source: Home / Self Care | Attending: Internal Medicine

## 2024-07-20 DIAGNOSIS — I3489 Other nonrheumatic mitral valve disorders: Secondary | ICD-10-CM

## 2024-07-20 DIAGNOSIS — B9561 Methicillin susceptible Staphylococcus aureus infection as the cause of diseases classified elsewhere: Secondary | ICD-10-CM | POA: Diagnosis not present

## 2024-07-20 DIAGNOSIS — I351 Nonrheumatic aortic (valve) insufficiency: Secondary | ICD-10-CM

## 2024-07-20 DIAGNOSIS — N186 End stage renal disease: Secondary | ICD-10-CM

## 2024-07-20 DIAGNOSIS — I110 Hypertensive heart disease with heart failure: Secondary | ICD-10-CM

## 2024-07-20 DIAGNOSIS — I330 Acute and subacute infective endocarditis: Secondary | ICD-10-CM

## 2024-07-20 DIAGNOSIS — R7881 Bacteremia: Secondary | ICD-10-CM | POA: Diagnosis not present

## 2024-07-20 DIAGNOSIS — I5032 Chronic diastolic (congestive) heart failure: Secondary | ICD-10-CM

## 2024-07-20 HISTORY — PX: TRANSESOPHAGEAL ECHOCARDIOGRAM (CATH LAB): EP1270

## 2024-07-20 LAB — ECHO TEE

## 2024-07-20 LAB — GLUCOSE, CAPILLARY
Glucose-Capillary: 217 mg/dL — ABNORMAL HIGH (ref 70–99)
Glucose-Capillary: 218 mg/dL — ABNORMAL HIGH (ref 70–99)
Glucose-Capillary: 218 mg/dL — ABNORMAL HIGH (ref 70–99)
Glucose-Capillary: 223 mg/dL — ABNORMAL HIGH (ref 70–99)

## 2024-07-20 LAB — BASIC METABOLIC PANEL WITH GFR
Anion gap: 17 — ABNORMAL HIGH (ref 5–15)
BUN: 46 mg/dL — ABNORMAL HIGH (ref 6–20)
CO2: 24 mmol/L (ref 22–32)
Calcium: 7.8 mg/dL — ABNORMAL LOW (ref 8.9–10.3)
Chloride: 89 mmol/L — ABNORMAL LOW (ref 98–111)
Creatinine, Ser: 10.7 mg/dL — ABNORMAL HIGH (ref 0.61–1.24)
GFR, Estimated: 6 mL/min — ABNORMAL LOW
Glucose, Bld: 206 mg/dL — ABNORMAL HIGH (ref 70–99)
Potassium: 4.2 mmol/L (ref 3.5–5.1)
Sodium: 131 mmol/L — ABNORMAL LOW (ref 135–145)

## 2024-07-20 LAB — PROTIME-INR
INR: 1.1 (ref 0.8–1.2)
Prothrombin Time: 14.9 s (ref 11.4–15.2)

## 2024-07-20 MED ORDER — SODIUM CHLORIDE 0.9 % IV SOLN: INTRAVENOUS | Status: DC

## 2024-07-20 MED ORDER — CLONIDINE HCL 0.1 MG PO TABS: 0.1000 mg | ORAL_TABLET | Freq: Two times a day (BID) | ORAL | Status: AC

## 2024-07-20 MED ORDER — LIDOCAINE 2% (20 MG/ML) 5 ML SYRINGE
INTRAMUSCULAR | Status: DC | PRN
  Administered 2024-07-20: 50 mg via INTRAVENOUS

## 2024-07-20 MED ORDER — PROPOFOL 10 MG/ML IV BOLUS
INTRAVENOUS | Status: DC | PRN
  Administered 2024-07-20: 130 ug/kg/min via INTRAVENOUS
  Administered 2024-07-20: 100 mg via INTRAVENOUS

## 2024-07-20 MED ORDER — CEFAZOLIN SODIUM-DEXTROSE 2-4 GM/100ML-% IV SOLN: 2.0000 g | INTRAVENOUS | Status: DC

## 2024-07-20 MED ORDER — CEFAZOLIN IV (FOR PTA / DISCHARGE USE ONLY): 2.0000 g | INTRAVENOUS | Status: AC

## 2024-07-20 NOTE — Transfer of Care (Signed)
 Immediate Anesthesia Transfer of Care Note  Patient: [name]  Procedure(s) Performed: TRANSESOPHAGEAL ECHOCARDIOGRAM  Patient Location: PACU and Cath Lab  Anesthesia Type:MAC  Level of Consciousness: drowsy  Airway & Oxygen  Therapy: Patient Spontanous Breathing and Patient connected to nasal cannula oxygen   Post-op Assessment: Report given to RN and Post -op Vital signs reviewed and stable  Post vital signs: Reviewed and stable  Last Vitals:  Vitals Value Taken Time  BP 73/59 07/20/24 11:46  Temp 36.6 C 07/20/24 11:46  Pulse 74 07/20/24 11:48  Resp 22 07/20/24 11:48  SpO2 99 % 07/20/24 11:48  Vitals shown include unfiled device data.  Last Pain:  Vitals:   07/20/24 1146  TempSrc: Tympanic  PainSc: Asleep      Patients Stated Pain Goal: 4 (07/16/24 0837)  Complications: No notable events documented.

## 2024-07-20 NOTE — Anesthesia Preprocedure Evaluation (Signed)
"                                    Anesthesia Evaluation  Patient identified by MRN, date of birth, ID band Patient awake    Reviewed: Allergy & Precautions, NPO status , Patient's Chart, lab work & pertinent test results, reviewed documented beta blocker date and time   History of Anesthesia Complications Negative for: history of anesthetic complications  Airway Mallampati: II  TM Distance: >3 FB     Dental no notable dental hx.    Pulmonary neg COPD   breath sounds clear to auscultation       Cardiovascular hypertension, (-) angina +CHF  (-) CAD and (-) Past MI  Rhythm:Regular Rate:Normal  1. Left ventricular ejection fraction, by estimation, is 60 to 65%. The  left ventricle has normal function. The left ventricle has no regional  wall motion abnormalities. There is moderate concentric left ventricular  hypertrophy. Left ventricular  diastolic parameters are consistent with Grade II diastolic dysfunction  (pseudonormalization).   2. Right ventricular systolic function is normal. The right ventricular  size is normal.   3. Left atrial size was mildly dilated.   4. A small pericardial effusion is present. The pericardial effusion is  posterior to the left ventricle. There is no evidence of cardiac  tamponade.   5. There is a large rounded calcified mass (1.9x1.3cm) attached to the  atrial surface of posterior leaflet/annulus of the mitral valve. The  mitral valve is normal in structure. Trivial mitral valve regurgitation.  No evidence of mitral stenosis.   6. The aortic valve is tricuspid. Aortic valve regurgitation is not  visualized. Aortic valve sclerosis/calcification is present, without any  evidence of aortic stenosis.   7. The inferior vena cava is dilated in size with >50% respiratory  variability, suggesting right atrial pressure of 8 mmHg.   8. Agitated saline contrast bubble study was negative, with no evidence  of any interatrial shunt.      Neuro/Psych Seizures -,  PSYCHIATRIC DISORDERS         GI/Hepatic ,GERD  ,,(+) neg Cirrhosis        Endo/Other  diabetes, Well Controlled, Type 1, Insulin  Dependent    Renal/GU ESRFRenal disease     Musculoskeletal   Abdominal   Peds  Hematology  (+) Blood dyscrasia, anemia   Anesthesia Other Findings Bacteremia   Reproductive/Obstetrics                              Anesthesia Physical Anesthesia Plan  ASA: 3  Anesthesia Plan: MAC   Post-op Pain Management:    Induction: Intravenous  PONV Risk Score and Plan: 2 and Treatment may vary due to age or medical condition  Airway Management Planned: Natural Airway and Simple Face Mask  Additional Equipment: None  Intra-op Plan:   Post-operative Plan:   Informed Consent: I have reviewed the patients History and Physical, chart, labs and discussed the procedure including the risks, benefits and alternatives for the proposed anesthesia with the patient or authorized representative who has indicated his/her understanding and acceptance.     Dental advisory given  Plan Discussed with: CRNA  Anesthesia Plan Comments:          Anesthesia Quick Evaluation  "

## 2024-07-20 NOTE — Progress Notes (Signed)
 PHARMACY CONSULT NOTE FOR:  Informational Antimicrobial Plans with Dialysis   Indication: MSSA Endocarditis Regimen: cefazolin  2g IV with HD (usual schedule is T-T-S)  End date: 08/28/2024   This is not an official OPAT note and is intended to be only informational that the patient will receive antimicrobial therapy after dialysis sessions.    Thank you for allowing pharmacy to be a part of this patient's care.   Feliciano Close, PharmD PGY2 Infectious Diseases Pharmacy Resident

## 2024-07-20 NOTE — Progress Notes (Signed)
 Discharge Nurse Summary: DC order noted per MD. DC RN at bedside with patient. Patient agreeable with discharge plan, states family will arrive soon for pickup. AVS printed/reviewed. PIV removed, skin intact. No DME needs. No home/TOC meds. CP/Edu resolved. Telemonitor returned to charging station. All belongings accounted for. Patient instructed to call front desk when ride arrives. Primary RN notified.   Rosario EMERSON Lund, RN

## 2024-07-20 NOTE — CV Procedure (Signed)
 Brief TEE Note  LVEF 55-60% Moderate LVH There is a large, round, mobile density on the atrial side of the posterior mitral valve leaflet.  Consistent with endocarditis.   There is a tiny, mobile echodensity on the right coronary cusp with trivial aortic regurgitation.  Concerning for endocarditis.   Transgastric images were not obtained due to resistance at the GE junction, retching and known history of Mallory Weiss tear.  For additional detail see full report.    Merryn Thaker C. Raford, MD, [facility] 07/20/2024 12:01 PM

## 2024-07-20 NOTE — Anesthesia Postprocedure Evaluation (Signed)
"   Anesthesia Post Note  Patient: [name]  Procedure(s) Performed: TRANSESOPHAGEAL ECHOCARDIOGRAM     Patient location during evaluation: PACU Anesthesia Type: MAC Level of consciousness: awake and alert Pain management: pain level controlled Vital Signs Assessment: post-procedure vital signs reviewed and stable Respiratory status: spontaneous breathing, nonlabored ventilation, respiratory function stable and patient connected to nasal cannula oxygen  Cardiovascular status: stable and blood pressure returned to baseline Postop Assessment: no apparent nausea or vomiting Anesthetic complications: no   No notable events documented.  Last Vitals:  Vitals:   07/20/24 1206 07/20/24 1228  BP: 116/70 120/81  Pulse: 74 74  Resp: 16 17  Temp:  36.6 C  SpO2: 100% 100%    Last Pain:  Vitals:   07/20/24 1455  TempSrc:   PainSc: 7                  [name] R Candance Bohlman      "

## 2024-07-20 NOTE — Plan of Care (Signed)
  Problem: Nutritional: Goal: Maintenance of adequate nutrition will improve Outcome: Progressing   Problem: Education: Goal: Knowledge of General Education information will improve Description: Including pain rating scale, medication(s)/side effects and non-pharmacologic comfort measures Outcome: Progressing   Problem: Health Behavior/Discharge Planning: Goal: Ability to manage health-related needs will improve Outcome: Progressing   Problem: Activity: Goal: Risk for activity intolerance will decrease Outcome: Progressing   Problem: Pain Managment: Goal: General experience of comfort will improve and/or be controlled Outcome: Progressing

## 2024-07-20 NOTE — Discharge Summary (Signed)
 "  Physician Discharge Summary  TERENCE BART FMW:[id number] DOB: [birth year] DOA: 07/15/2024  PCP: Jaycee Greig PARAS, NP  Admit date: 07/15/2024 Discharge date: 07/20/2024  Admitted From: Home  Discharge disposition: Home    Recommendations for Outpatient Follow-Up:   Follow up with your primary care provider in one week.  Check CBC, BMP, magnesium in the next visit Follow up with infectious disease as scheduled by the clinic Continue HD and antibiotics as scheduled.    Discharge Diagnosis:   Principal Problem:   Intractable nausea and vomiting Active Problems:   Abdominal pain   Leukocytosis   Hypertensive urgency   Anemia of chronic disease   Prolonged QT interval   ESRD (end stage renal disease) on dialysis (HCC)   Insulin  dependent type 1 diabetes mellitus (HCC)   Chronic diastolic CHF (congestive heart failure) (HCC)   GERD (gastroesophageal reflux disease)   ESRD (end stage renal disease) (HCC)   Acute bacterial endocarditis   Discharge Condition: Improved.  Diet recommendation: Low sodium, heart healthy.  Carbohydrate-modified.  Small portion meals.  Wound care: None.  Code status: Full.   History of Present Illness:   [name] is a 39 y.o. male with medical history significant of hypertension, hyperlipidemia, diastolic CHF, diabetes mellitus type 2,  ESRD on hemodialysis (TTS), and GERD with prior presents with persistent vomiting and abdominal pain.  In the ED, patient was slightly tachycardic, tachypneic and had low-grade fever with elevated blood pressure.  Chest x-ray without any infiltrate.   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  Patient was given antiemetics, fentanyl  IV for pain, and Protonix  40 mg IV and was admitted to the hospital for further  evaluation and treatment.    Hospital Course:   Following conditions were addressed during hospitalization as listed below,  Intractable nausea and vomiting Abdominal pain Resolved at this time.    Has been advanced to regular diet and had tolerated. 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 History of EGD LA grade D esophagitis, Mallory-Weiss tear, gastric polyp, and gastritis.  Continue Protonix  40 milligram twice daily, continue Carafate .  CTA showing no significant stenosis or sign of obstruction Flu, COVID and influenza was negative.      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  Status post new hemodialysis catheter placement 07/19/2024.  ID on board.  S/p TEE on Friday, 07/20/2024 with large vegetation in the mitral valve small vegetation and the aortic valve concerning for mitral and aortic valve endocarditis.   ID has seen the patient and patient will receive the cefazolin  for 6 weeks with dialysis.  This has been arranged prior to discharge.   Leukocytosis Resolved   Hypertensive urgency Continue clonidine  labetalol  and minoxidil ,.  Overall improved.   Anemia of chronic kidney disease Latest hemoglobin of 9.1 from initial 9.6.  Will monitor.   ESRD on HD On Tuesday Thursday Saturday schedule.  Hemodialysis as per nephrology.   Hyponatremia.  Patient is on hemodialysis.  Will continue to monitor.  Latest sodium of 131.   Insulin -dependent diabetes mellitus  Recent hemoglobin A1c of 6.5  on 06/18/2024.  Continue Semglee  at night and sliding scale insulin .  Overall glycemic control adequate.  Latest POC of 217   Diastolic congestive heart failure Compensated at this time.  Review of last echocardiogram with LV EF to be 60 to 65% with grade 3 diastolic dysfunction on  10/2022.  Continue  follow-up management with dialysis.  Disposition.  At this time, patient is stable for disposition home with outpatient PCP and ID follow-up  Medical Consultants:   Nephrology Infectious disease  Procedures:    Hemodialysis Right internal jugular hemodialysis catheter removal on 07/17/2024 Right internal jugular hemodialysis catheter placement on 07/19/2024 TEE on 07/20/2024   Subjective:   Today, patient was seen and examined. Denies interval complains. Seen after TEE.  Patient denies any chest pain, shortness of breath, dyspnea fever or chills.   Discharge Exam:   Vitals:   07/20/24 1206 07/20/24 1228  BP: 116/70 120/81  Pulse: 74 74  Resp: 16 17  Temp:  97.9 F (36.6 C)  SpO2: 100% 100%   Vitals:   07/20/24 1146 07/20/24 1156 07/20/24 1206 07/20/24 1228  BP: (!) 73/59 (!) 87/78 116/70 120/81  Pulse: 73 71 74 74  Resp: 20 20 16 17   Temp: 97.8 F (36.6 C)   97.9 F (36.6 C)  TempSrc: Tympanic   Oral  SpO2: 98% 99% 100% 100%  Weight:      Height:       Body mass index is 27.9 kg/m.  General:  Average built, not in obvious distress, legally blind HENT:   No scleral pallor or icterus noted. Oral mucosa is moist.  Right internal jugular hemodialysis catheter in place Chest:  Clear breath sounds.  Diminished breath sounds bilaterally. No crackles or wheezes.  CVS: S1 &S2 heard. No murmur.  Regular rate and rhythm. Abdomen: Soft, nontender, nondistended.  Bowel sounds are heard.   Extremities: No cyanosis, clubbing or edema.  Left upper extremity AV fistula in place Psych: Alert, awake and oriented, normal mood CNS: Alert plan.  Or equal equal in all extremities Skin: Warm and dry with skin pigmentation.    The results of significant diagnostics from this hospitalization (including imaging, microbiology, ancillary and laboratory) are listed below for reference.     Diagnostic Studies:   CT Angio Chest/Abd/Pel for Dissection W and/or W/WO Result  Date: 07/15/2024 EXAM: CTA CHEST, ABDOMEN AND PELVIS WITHOUT AND WITH CONTRAST 07/15/2024 04:21:17 AM TECHNIQUE: CTA of the chest was performed without and with the administration of 75 mL iohexol  (OMNIPAQUE ) 350 MG/ML intravenous contrast. CTA of the abdomen and pelvis was performed without and with the administration of 75 mL iohexol  (OMNIPAQUE ) 350 MG/ML intravenous contrast. Multiplanar reformatted images are provided for review. MIP images are provided for review. Automated exposure control, iterative reconstruction, and/or weight based adjustment of the mA/kV was utilized to reduce the radiation dose to as low as reasonably achievable. COMPARISON: CT of the abdomen and pelvis dated 11/09/2023. CLINICAL HISTORY: Acute aortic syndrome (AAS) suspected. FINDINGS: VASCULATURE: AORTA: No acute finding. No abdominal aortic aneurysm. No dissection. PULMONARY ARTERIES: No pulmonary embolism with the limits of this exam. GREAT VESSELS OF AORTIC ARCH: No acute finding. No dissection. No arterial occlusion or significant stenosis. CELIAC TRUNK: No acute finding. No occlusion or significant stenosis. SUPERIOR MESENTERIC ARTERY: Mild-to-moderate calcific atheromatous disease. No significant stenosis demonstrated. INFERIOR MESENTERIC ARTERY: No acute finding. No occlusion or significant stenosis. RENAL ARTERIES: There are 2 right renal arteries. There is a solitary left renal artery. No occlusion or significant stenosis. ILIAC ARTERIES: No acute finding. No occlusion or significant  stenosis. CHEST: MEDIASTINUM: No mediastinal lymphadenopathy. The heart and pericardium demonstrate no acute abnormality. LUNGS AND PLEURA: Mild atelectasis present dependently within the lower lobes bilaterally. No focal consolidation or pulmonary edema. No evidence of pleural effusion or pneumothorax. THORACIC BONES AND SOFT TISSUES: A right internal jugular central venous line is present. No acute bone abnormality. ABDOMEN AND PELVIS: LIVER: The  liver is unremarkable. GALLBLADDER AND BILE DUCTS: Gallbladder is unremarkable. No biliary ductal dilatation. SPLEEN: The spleen is unremarkable. PANCREAS: The pancreas is unremarkable. ADRENAL GLANDS: Bilateral adrenal glands demonstrate no acute abnormality. 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 No stones in the kidneys or ureters. No hydronephrosis. No perinephric or periureteral stranding. Urinary bladder is unremarkable. GI AND BOWEL: Stomach and duodenal sweep demonstrate no acute abnormality. There is no bowel obstruction. No abnormal bowel wall thickening or distension. REPRODUCTIVE: Reproductive organs are unremarkable. PERITONEUM AND RETROPERITONEUM: No ascites or free air. LYMPH NODES: No lymphadenopathy. ABDOMINAL BONES AND SOFT TISSUES: No acute abnormality of the bones. Bilateral gynecomastia. No acute soft tissue abnormality. IMPRESSION: 1. No acute aortic syndrome. 2. Mild dependent atelectasis in the bilateral lower lobes. 3. 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 4. Right internal jugular central venous line in place. 5. Horseshoe kidney. Electronically signed by: Evalene Coho MD 07/15/2024 04:53 AM EST RP Workstation: [id number]     Labs:   Basic Metabolic Panel: Recent Labs  Lab 07/14/24 0530 07/15/24 9781 07/16/24 9572 07/17/24 9553 07/18/24 0429 07/19/24 0618 07/20/24 0838  NA 138   < > 132* 131* 129* 128* 131*  K 4.1   < > 4.7 3.5 3.6 4.0 4.2  CL 94*   < > 87* 87* 87* 86* 89*  CO2 25   < > 21* 26 24 20* 24  GLUCOSE 142*   < > 159* 122* 130* 155* 206*  BUN 49*   < > 83* 50* 59* 72* 46*  CREATININE 12.10*   < > 17.40* 12.20* 14.50* 16.30* 10.70*  CALCIUM  7.8*   < > 7.1* 7.7* 7.2* 7.3* 7.8*  MG 2.0  --  2.1 2.2 2.2  --   --   PHOS  --   --   --   --   --  5.6*  --    < > = values in this interval not displayed.   GFR Estimated Creatinine  Clearance: 11 mL/min (A) (by C-G formula based on SCr of 10.7 mg/dL (H)). Liver Function Tests: Recent Labs  Lab 07/14/24 0530 07/15/24 0218 07/19/24 0618  AST 16 24  --   ALT 10 17  --   ALKPHOS 72 64  --   BILITOT 0.4 0.4  --   PROT 8.2* 8.2*  --   ALBUMIN 4.4 4.3 3.6   Recent Labs  Lab 07/15/24 0218  LIPASE 13   No results for input(s): AMMONIA in the last 168 hours. Coagulation profile Recent Labs  Lab 07/20/24 0838  INR 1.1    CBC: Recent Labs  Lab 07/14/24 0530 07/15/24 0218 07/15/24 0910 07/16/24 0427 07/17/24 0446 07/18/24 0429 07/19/24 0618  WBC 8.4 12.4*  --  12.8* 7.4 6.4 6.2  NEUTROABS 7.4  --   --   --   --   --  4.1  HGB 11.3* 10.9* 9.5* 9.6* 9.6* 9.4* 9.1*  HCT 35.4* 33.1* 29.5* 30.1* 29.1* 28.9* 27.7*  MCV 87.0 83.2  --  85.0 82.9 83.8 82.2  PLT 236 216  --  159 182 166 190   Cardiac Enzymes: Recent Labs  Lab 07/14/24 0530  CKTOTAL 272   BNP: Invalid input(s): POCBNP CBG: Recent Labs  Lab 07/19/24 1704 07/20/24 0004 07/20/24 0813 07/20/24 1231 07/20/24 1634  GLUCAP 239* 218* 218* 217* 223*   D-Dimer No results for input(s): DDIMER in the last 72 hours. Hgb A1c No results for input(s): HGBA1C in the last 72 hours. Lipid Profile No results for input(s): CHOL, HDL, LDLCALC, TRIG, CHOLHDL, LDLDIRECT in the last 72 hours. Thyroid function studies No results for input(s): TSH, T4TOTAL, T3FREE, THYROIDAB in the last 72 hours.  Invalid input(s): FREET3 Anemia work up No results for input(s): VITAMINB12, FOLATE, FERRITIN, TIBC, IRON, RETICCTPCT in the last 72 hours. Microbiology Recent Results (from the past 240 hours)  Resp panel by RT-PCR (RSV, Flu A&B, Covid) Anterior Nasal Swab     Status: None   Collection Time: 07/14/24  5:28 AM   Specimen: Anterior Nasal Swab  Result Value Ref Range Status   SARS Coronavirus 2 by RT PCR NEGATIVE NEGATIVE Final   Influenza A by PCR NEGATIVE NEGATIVE  Final   Influenza B by PCR NEGATIVE NEGATIVE Final    Comment: (NOTE) The Xpert Xpress SARS-CoV-2/FLU/RSV plus assay is intended as an aid in the diagnosis of influenza from Nasopharyngeal swab specimens and should not be used as a sole basis for treatment. Nasal washings and aspirates are unacceptable for Xpert Xpress SARS-CoV-2/FLU/RSV testing.  Fact Sheet for Patients: bloggercourse.com  Fact Sheet for Healthcare Providers: seriousbroker.it  This test is not yet approved or cleared by the [place]  FDA and has been authorized for detection and/or diagnosis of SARS-CoV-2 by FDA under an Emergency Use Authorization (EUA). This EUA will remain in effect (meaning this test can be used) for the duration of the COVID-19 declaration under Section 564(b)(1) of the Act, 21 U.S.C. section 360bbb-3(b)(1), unless the authorization is terminated or revoked.     Resp Syncytial Virus by PCR NEGATIVE NEGATIVE Final    Comment: (NOTE) Fact Sheet for Patients: bloggercourse.com  Fact Sheet for Healthcare Providers: seriousbroker.it  This test is not yet approved or cleared by the [place]  FDA and has been authorized for detection and/or diagnosis of SARS-CoV-2 by FDA under an Emergency Use Authorization (EUA). This EUA will remain in effect (meaning this test can be used) for the duration of the COVID-19 declaration under Section 564(b)(1) of the Act, 21 U.S.C. section 360bbb-3(b)(1), unless the authorization is terminated or revoked.  Performed at [facility], [address], [place], [place] [address]   Culture, blood (routine x 2)     Status: Abnormal   Collection Time: 07/15/24  3:29 AM   Specimen: BLOOD  Result Value Ref Range Status   Specimen Description BLOOD SITE NOT SPECIFIED  Final   Special Requests   Final    BOTTLES DRAWN AEROBIC AND ANAEROBIC Blood Culture  results may not be optimal due to an inadequate volume of blood received in culture bottles   Culture  Setup Time   Final    GRAM POSITIVE COCCI IN CLUSTERS IN BOTH AEROBIC AND ANAEROBIC BOTTLES CRITICAL RESULT CALLED TO, READ BACK BY AND VERIFIED WITH: PHARMD ERIN WILLIAMSON [id number] AT 1759 BY EC Performed at [facility], [address] [place], [place] [address]    Culture STAPHYLOCOCCUS AUREUS (A)  Final   Report Status 07/17/2024 FINAL  Final   Organism ID, Bacteria STAPHYLOCOCCUS AUREUS  Final  Susceptibility   Staphylococcus aureus - MIC*    CIPROFLOXACIN  <=0.5 SENSITIVE Sensitive     ERYTHROMYCIN <=0.25 SENSITIVE Sensitive     GENTAMICIN <=0.5 SENSITIVE Sensitive     OXACILLIN 0.5 SENSITIVE Sensitive     TETRACYCLINE <=1 SENSITIVE Sensitive     VANCOMYCIN  1 SENSITIVE Sensitive     TRIMETH /SULFA  <=10 SENSITIVE Sensitive     CLINDAMYCIN <=0.25 SENSITIVE Sensitive     RIFAMPIN <=0.5 SENSITIVE Sensitive     Inducible Clindamycin NEGATIVE Sensitive     LINEZOLID 2 SENSITIVE Sensitive     * STAPHYLOCOCCUS AUREUS  Blood Culture ID Panel (Reflexed)     Status: Abnormal   Collection Time: 07/15/24  3:29 AM  Result Value Ref Range Status   Enterococcus faecalis NOT DETECTED NOT DETECTED Final   Enterococcus Faecium NOT DETECTED NOT DETECTED Final   Listeria monocytogenes NOT DETECTED NOT DETECTED Final   Staphylococcus species DETECTED (A) NOT DETECTED Final    Comment: CRITICAL RESULT CALLED TO, READ BACK BY AND VERIFIED WITH: PHARMD ERIN WILLIAMS ON [id number] AT 1759 BY EC    Staphylococcus aureus (BCID) DETECTED (A) NOT DETECTED Final    Comment: CRITICAL RESULT CALLED TO, READ BACK BY AND VERIFIED WITH: PHARMD ROCKY SLADE [id number] AT 1759 BY EC    Staphylococcus epidermidis NOT DETECTED NOT DETECTED Final   Staphylococcus lugdunensis NOT DETECTED NOT DETECTED Final   Streptococcus species NOT DETECTED NOT DETECTED Final   Streptococcus agalactiae NOT DETECTED  NOT DETECTED Final   Streptococcus pneumoniae NOT DETECTED NOT DETECTED Final   Streptococcus pyogenes NOT DETECTED NOT DETECTED Final   A.calcoaceticus-baumannii NOT DETECTED NOT DETECTED Final   Bacteroides fragilis NOT DETECTED NOT DETECTED Final   Enterobacterales NOT DETECTED NOT DETECTED Final   Enterobacter cloacae complex NOT DETECTED NOT DETECTED Final   Escherichia coli NOT DETECTED NOT DETECTED Final   Klebsiella aerogenes NOT DETECTED NOT DETECTED Final   Klebsiella oxytoca NOT DETECTED NOT DETECTED Final   Klebsiella pneumoniae NOT DETECTED NOT DETECTED Final   Proteus species NOT DETECTED NOT DETECTED Final   Salmonella species NOT DETECTED NOT DETECTED Final   Serratia marcescens NOT DETECTED NOT DETECTED Final   Haemophilus influenzae NOT DETECTED NOT DETECTED Final   Neisseria meningitidis NOT DETECTED NOT DETECTED Final   Pseudomonas aeruginosa NOT DETECTED NOT DETECTED Final   Stenotrophomonas maltophilia NOT DETECTED NOT DETECTED Final   Candida albicans NOT DETECTED NOT DETECTED Final   Candida auris NOT DETECTED NOT DETECTED Final   Candida glabrata NOT DETECTED NOT DETECTED Final   Candida krusei NOT DETECTED NOT DETECTED Final   Candida parapsilosis NOT DETECTED NOT DETECTED Final   Candida tropicalis NOT DETECTED NOT DETECTED Final   Cryptococcus neoformans/gattii NOT DETECTED NOT DETECTED Final   Meth resistant mecA/C and MREJ NOT DETECTED NOT DETECTED Final    Comment: Performed at [facility], [address], [place], [place] [address]  Respiratory (~20 pathogens) panel by PCR     Status: None   Collection Time: 07/15/24  8:03 AM   Specimen: Nasopharyngeal Swab; Respiratory  Result Value Ref Range Status   Adenovirus NOT DETECTED NOT DETECTED Final   Coronavirus 229E NOT DETECTED NOT DETECTED Final    Comment: (NOTE) The Coronavirus on the Respiratory Panel, DOES NOT test for the novel  Coronavirus (2019 nCoV)    Coronavirus HKU1 NOT DETECTED  NOT DETECTED Final   Coronavirus NL63 NOT DETECTED NOT DETECTED Final   Coronavirus OC43 NOT DETECTED NOT DETECTED  Final   Metapneumovirus NOT DETECTED NOT DETECTED Final   Rhinovirus / Enterovirus NOT DETECTED NOT DETECTED Final   Influenza A NOT DETECTED NOT DETECTED Final   Influenza B NOT DETECTED NOT DETECTED Final   Parainfluenza Virus 1 NOT DETECTED NOT DETECTED Final   Parainfluenza Virus 2 NOT DETECTED NOT DETECTED Final   Parainfluenza Virus 3 NOT DETECTED NOT DETECTED Final   Parainfluenza Virus 4 NOT DETECTED NOT DETECTED Final   Respiratory Syncytial Virus NOT DETECTED NOT DETECTED Final   Bordetella pertussis NOT DETECTED NOT DETECTED Final   Bordetella Parapertussis NOT DETECTED NOT DETECTED Final   Chlamydophila pneumoniae NOT DETECTED NOT DETECTED Final   Mycoplasma pneumoniae NOT DETECTED NOT DETECTED Final    Comment: Performed at [facility], [address] [place], [place] [address]  Culture, blood (routine x 2)     Status: Abnormal   Collection Time: 07/15/24  3:14 PM   Specimen: BLOOD RIGHT HAND  Result Value Ref Range Status   Specimen Description BLOOD RIGHT HAND  Final   Special Requests   Final    BOTTLES DRAWN AEROBIC AND ANAEROBIC Blood Culture results may not be optimal due to an inadequate volume of blood received in culture bottles   Culture  Setup Time   Final    GRAM POSITIVE COCCI IN BOTH AEROBIC AND ANAEROBIC BOTTLES CRITICAL VALUE NOTED.  VALUE IS CONSISTENT WITH PREVIOUSLY REPORTED AND CALLED VALUE.    Culture (A)  Final    STAPHYLOCOCCUS AUREUS SUSCEPTIBILITIES PERFORMED ON PREVIOUS CULTURE WITHIN THE LAST 5 DAYS. Performed at [facility], [address], [place], [place] [address]    Report Status 07/18/2024 FINAL  Final  Culture, blood (Routine X 2) w Reflex to ID Panel     Status: None (Preliminary result)   Collection Time: 07/16/24  8:13 PM   Specimen: BLOOD RIGHT ARM  Result Value Ref Range Status   Specimen  Description BLOOD RIGHT ARM  Final   Special Requests   Final    BOTTLES DRAWN AEROBIC AND ANAEROBIC Blood Culture adequate volume   Culture   Final    NO GROWTH 4 DAYS Performed at [facility], [address], [place], [place] [address]    Report Status PENDING  Incomplete  Culture, blood (Routine X 2) w Reflex to ID Panel     Status: None (Preliminary result)   Collection Time: 07/16/24  8:18 PM   Specimen: BLOOD RIGHT HAND  Result Value Ref Range Status   Specimen Description BLOOD RIGHT HAND  Final   Special Requests   Final    BOTTLES DRAWN AEROBIC AND ANAEROBIC Blood Culture adequate volume   Culture   Final    NO GROWTH 4 DAYS Performed at [facility], [address], [place], [place] [address]    Report Status PENDING  Incomplete  Culture, blood (Routine X 2) w Reflex to ID Panel     Status: None (Preliminary result)   Collection Time: 07/18/24 10:22 AM   Specimen: BLOOD RIGHT HAND  Result Value Ref Range Status   Specimen Description BLOOD RIGHT HAND  Final   Special Requests   Final    BOTTLES DRAWN AEROBIC AND ANAEROBIC Blood Culture adequate volume   Culture   Final    NO GROWTH 2 DAYS Performed at [facility], [address] [place], [place] [address]    Report Status PENDING  Incomplete  Culture, blood (Routine X  2) w Reflex to ID Panel     Status: None (Preliminary result)   Collection Time: 07/18/24 10:22 AM   Specimen: BLOOD RIGHT ARM  Result Value Ref Range Status   Specimen Description BLOOD RIGHT ARM  Final   Special Requests   Final    BOTTLES DRAWN AEROBIC AND ANAEROBIC Blood Culture adequate volume   Culture   Final    NO GROWTH 2 DAYS Performed at [facility], [address] [place], [place] [address]    Report Status PENDING  Incomplete     Discharge Instructions:   Discharge Instructions     Discharge instructions   Complete by: As directed    Follow up with your primary care provider in one week. Continue IV  antibiotics during HD as arranged by HD clinic. Seek medical attention for worsening symptoms. Follow up with infectious disease clinic as scheduled by the clinic.   Home infusion instructions   Complete by: As directed    Instructions: Flushing of vascular access device: 0.9% NaCl pre/post medication administration and prn patency; Heparin  100 u/ml, 5ml for implanted ports and Heparin  10u/ml, 5ml for all other central venous catheters.   Increase activity slowly   Complete by: As directed    No wound care   Complete by: As directed       Allergies as of 07/20/2024       Reactions   Apresoline  [hydralazine ] Nausea And Vomiting, Other (See Comments)   Patient was taking Hydralazine  AND Amlodipine  at the same time, so the reactions came from one of the 2: Lethargy and an all-over feeling of NOT feeling well   Norvasc  [amlodipine ] Nausea And Vomiting, Other (See Comments)   Patient was taking Amlodipine  AND Hydralazine  at the same time, so the reactions came from one of the 2: Lethargy and an all-over feeling of NOT feeling well         Medication List     STOP taking these medications    sucralfate  1 GM/10ML suspension Commonly known as: CARAFATE        TAKE these medications    atorvastatin  40 MG tablet Commonly known as: LIPITOR Take 1 tablet (40 mg total) by mouth daily. What changed: when to take this   ceFAZolin  IVPB Commonly known as: ANCEF  Inject 2 g into the vein Every Tuesday,Thursday,and Saturday with dialysis. Indication: MSSA endocarditis Last Day of Therapy:  08/28/2024 Labs - Once weekly:  CBC/D and BMP, Labs - Once weekly: ESR and CRP Method of administration: Per HD protocol Start taking on: July 21, 2024   cloNIDine  0.1 MG tablet Commonly known as: CATAPRES  Take 1 tablet (0.1 mg total) by mouth 2 (two) times daily. What changed: Another medication with the same name was removed. Continue taking this medication, and follow the directions you see  here.   diphenhydramine -acetaminophen  25-500 MG Tabs tablet Commonly known as: TYLENOL  PM Take 2 tablets by mouth 2 (two) times daily as needed (pain, sleep).   insulin  glargine-yfgn 100 UNIT/ML injection Commonly known as: SEMGLEE  Inject 0.12 mLs (12 Units total) into the skin at bedtime. What changed: how much to take   insulin  lispro 100 UNIT/ML KwikPen Commonly known as: HUMALOG  INJECT PER SLIDING SCALE 3 TIMES DAILY WITH MEALS IF GLUCOSE IS 151-200=1 UNIT,201-250=2U, 251-300=3U,301-350=5U, 351-400=7U. CALL MD IF >400   labetalol  200 MG tablet Commonly known as: NORMODYNE  Take 1 tablet (200 mg total) by mouth 2 (two) times daily. What changed: when to take this  Lokelma  5 g packet Generic drug: sodium zirconium cyclosilicate  Take 5 g by mouth See admin instructions. Take 5 g by mouth every day not going to dialysis   metoCLOPramide  10 MG tablet Commonly known as: REGLAN  Take 10 mg by mouth every 6 (six) hours as needed for vomiting or nausea.   minoxidil  2.5 MG tablet Commonly known as: LONITEN  Take 2.5 mg by mouth 2 (two) times daily.   multivitamin with minerals tablet Take 1 tablet by mouth every other day.   ondansetron  4 MG disintegrating tablet Commonly known as: ZOFRAN -ODT Take 1 tablet (4 mg total) by mouth every 8 (eight) hours as needed for nausea or vomiting.   oxyCODONE -acetaminophen  5-325 MG tablet Commonly known as: Percocet Take 1 tablet by mouth every 4 (four) hours as needed for severe pain (pain score 7-10).   pantoprazole  40 MG tablet Commonly known as: PROTONIX  TAKE 1 TABLET (40 MG TOTAL) BY MOUTH 2 (TWO) TIMES DAILY.   Sure Comfort Pen Needles 31G X 8 MM Misc Generic drug: Insulin  Pen Needle USE AS DIRECTED WITH INSULIN  PENS FOR INJECTION               Home Infusion Instuctions  (From admission, onward)           Start     Ordered   07/20/24 0000  Home infusion instructions       Question:  Instructions  Answer:  Flushing  of vascular access device: 0.9% NaCl pre/post medication administration and prn patency; Heparin  100 u/ml, 5ml for implanted ports and Heparin  10u/ml, 5ml for all other central venous catheters.   07/20/24 1610            Follow-up Information     Jaycee Greig PARAS, NP Follow up in 1 week(s).   Specialty: Nurse Practitioner Contact information: [address] [place] [address] [phone]                  Time coordinating discharge: 39 minutes  Signed:  Jarell Mcewen  [facility] 07/20/2024, 5:07 PM          "

## 2024-07-20 NOTE — Interval H&P Note (Signed)
 History and Physical Interval Note:  07/20/2024 10:02 AM  [name]  has presented today for surgery, with the diagnosis of bacteremia.  The various methods of treatment have been discussed with the patient and family. After consideration of risks, benefits and other options for treatment, the patient has consented to  Procedures: TRANSESOPHAGEAL ECHOCARDIOGRAM (N/A) as a surgical intervention.  The patient's history has been reviewed, patient examined, no change in status, stable for surgery.  I have reviewed the patient's chart and labs.  Questions were answered to the patient's satisfaction.     Annabella Scarce, MD

## 2024-07-20 NOTE — Plan of Care (Signed)
 " Problem: Education: Goal: Ability to describe self-care measures that may prevent or decrease complications (Diabetes Survival Skills Education) will improve Outcome: Adequate for Discharge Goal: Individualized Educational Video(s) Outcome: Adequate for Discharge   Problem: Coping: Goal: Ability to adjust to condition or change in health will improve Outcome: Adequate for Discharge   Problem: Fluid Volume: Goal: Ability to maintain a balanced intake and output will improve Outcome: Adequate for Discharge   Problem: Health Behavior/Discharge Planning: Goal: Ability to identify and utilize available resources and services will improve Outcome: Adequate for Discharge Goal: Ability to manage health-related needs will improve Outcome: Adequate for Discharge   Problem: Metabolic: Goal: Ability to maintain appropriate glucose levels will improve Outcome: Adequate for Discharge   Problem: Nutritional: Goal: Maintenance of adequate nutrition will improve Outcome: Adequate for Discharge Goal: Progress toward achieving an optimal weight will improve Outcome: Adequate for Discharge   Problem: Skin Integrity: Goal: Risk for impaired skin integrity will decrease Outcome: Adequate for Discharge   Problem: Tissue Perfusion: Goal: Adequacy of tissue perfusion will improve Outcome: Adequate for Discharge   Problem: Education: Goal: Knowledge of General Education information will improve Description: Including pain rating scale, medication(s)/side effects and non-pharmacologic comfort measures Outcome: Adequate for Discharge   Problem: Health Behavior/Discharge Planning: Goal: Ability to manage health-related needs will improve Outcome: Adequate for Discharge   Problem: Clinical Measurements: Goal: Ability to maintain clinical measurements within normal limits will improve Outcome: Adequate for Discharge Goal: Will remain free from infection Outcome: Adequate for Discharge Goal:  Diagnostic test results will improve Outcome: Adequate for Discharge Goal: Respiratory complications will improve Outcome: Adequate for Discharge Goal: Cardiovascular complication will be avoided Outcome: Adequate for Discharge   Problem: Activity: Goal: Risk for activity intolerance will decrease Outcome: Adequate for Discharge   Problem: Nutrition: Goal: Adequate nutrition will be maintained Outcome: Adequate for Discharge   Problem: Coping: Goal: Level of anxiety will decrease Outcome: Adequate for Discharge   Problem: Elimination: Goal: Will not experience complications related to bowel motility Outcome: Adequate for Discharge Goal: Will not experience complications related to urinary retention Outcome: Adequate for Discharge   Problem: Pain Managment: Goal: General experience of comfort will improve and/or be controlled Outcome: Adequate for Discharge   Problem: Safety: Goal: Ability to remain free from injury will improve Outcome: Adequate for Discharge   Problem: Skin Integrity: Goal: Risk for impaired skin integrity will decrease Outcome: Adequate for Discharge   Problem: Education: Goal: Knowledge of the prescribed therapeutic regimen will improve Outcome: Adequate for Discharge   Problem: Bowel/Gastric: Goal: Gastrointestinal status for postoperative course will improve Outcome: Adequate for Discharge   Problem: Cardiac: Goal: Ability to maintain an adequate cardiac output Outcome: Adequate for Discharge Goal: Will show no evidence of cardiac arrhythmias Outcome: Adequate for Discharge   Problem: Nutritional: Goal: Will attain and maintain optimal nutritional status Outcome: Adequate for Discharge   Problem: Neurological: Goal: Will regain or maintain usual level of consciousness Outcome: Adequate for Discharge   Problem: Clinical Measurements: Goal: Ability to maintain clinical measurements within normal limits Outcome: Adequate for  Discharge Goal: Postoperative complications will be avoided or minimized Outcome: Adequate for Discharge   Problem: Respiratory: Goal: Will regain and/or maintain adequate ventilation Outcome: Adequate for Discharge Goal: Respiratory status will improve Outcome: Adequate for Discharge   Problem: Skin Integrity: Goal: Demonstrates signs of wound healing without infection Outcome: Adequate for Discharge   Problem: Urinary Elimination: Goal: Will remain free from infection Outcome: Adequate for Discharge Goal: Ability to  achieve and maintain adequate urine output Outcome: Adequate for Discharge   "

## 2024-07-20 NOTE — Progress Notes (Signed)
 " PROGRESS NOTE  [name]:[id number] DOB: Jul 01, 1986 DOA: 07/15/2024 PCP: Jaycee Greig PARAS, NP   LOS: 5 days   Brief narrative:   [name] is a 39 y.o. male with medical history significant of hypertension, hyperlipidemia, diastolic CHF, diabetes mellitus type 2,  ESRD on hemodialysis (TTS), and GERD with prior presents with persistent vomiting and abdominal pain.  In the ED, patient was slightly tachycardic, tachypneic and had low-grade fever with elevated blood pressure.  Chest x-ray without any infiltrate.   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  Patient had been given antiemetics, fentanyl  IV for pain, and Protonix  40 mg IV and was admitted to the hospital for further evaluation and treatment.   Assessment/Plan: Principal Problem:   Intractable nausea and vomiting Active Problems:   Abdominal pain   Leukocytosis   Hypertensive urgency   Anemia of chronic disease   Prolonged QT interval   ESRD (end stage renal disease) on dialysis (HCC)   Insulin  dependent type 1 diabetes mellitus (HCC)   Chronic diastolic CHF (congestive heart failure) (HCC)   GERD (gastroesophageal reflux disease)   ESRD (end stage renal disease) (HCC)  Intractable nausea and vomiting Abdominal pain Improved at this time.    Has been advanced to regular diet and had tolerated. 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 History of EGD LA grade D esophagitis, Mallory-Weiss tear, gastric polyp, and gastritis.  Continue Protonix  40 milligram twice daily, continue Carafate .  CTA showing no significant stenosis or sign of obstruction.  Continue supportive care, scopolamine  patch.  Flu, COVID and influenza was negative.   Reinforced against  narcotics if possible.  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  Status post new hemodialysis catheter placement 07/19/2024.  ID on board.  S/p TEE on Friday, 07/20/2024 with large vegetation in the mitral valve small vegetation and the aortic valve concerning for mitral and aortic valve endocarditis.     Leukocytosis Improved at this time.   Hypertensive urgency Continue clonidine  labetalol  and minoxidil ,.  Overall improved.   Anemia of chronic kidney disease Latest hemoglobin of 9.1 from initial 9.6.  Will monitor.  ESRD on HD On Tuesday Thursday Saturday schedule.  Hemodialysis as per nephrology.  Hyponatremia.  Patient is on hemodialysis.  Will continue to monitor.  Latest sodium of 131.   Insulin -dependent diabetes mellitus  Recent hemoglobin A1c of 6.5  on 06/18/2024.  Continue Semglee  at night and sliding scale insulin .  Overall glycemic control adequate.  Latest POC of 217   Diastolic congestive heart failure Compensated at this time.  Review of last echocardiogram with LV EF to be 60 to 65% with grade 3 diastolic dysfunction on 10/2022.  Continue f follow-up management with dialysis.  DVT prophylaxis: heparin  injection 5,000 Units Start: 07/19/24 1530 SCDs Start: 07/15/24 0801   Disposition: Home likely in 2 to 3 days, TEE with vegetation.  Status is: Inpatient Remains inpatient appropriate because: Pending clinical improvement, IV antibiotic, pending further workup    Code Status:     Code Status: Full Code  Family Communication: Spoke with the patient's aunt at bedside on 07/16/2024  Consultants: Nephrology Infectious disease  Procedures:  Hemodialysis Right internal jugular hemodialysis  catheter removal on 07/17/2024 Right internal jugular hemodialysis catheter placement on 07/19/2024 TEE on 07/20/2024  Anti-infectives:  Cefazolin   IV  Anti-infectives (From admission, onward)    Start     Dose/Rate Route Frequency Ordered Stop   07/19/24 1127  ceFAZolin  (ANCEF ) 2-4 GM/100ML-% IVPB       Note to Pharmacy: Chauncey Rubin L: cabinet override      07/19/24 1127 07/19/24 2344   07/15/24 1815  ceFAZolin  (ANCEF ) IVPB 1 g/50 mL premix        1 g 100 mL/hr over 30 Minutes Intravenous Every 24 hours 07/15/24 1805         Subjective: Today, patient was seen and examined at bedside.  Seen after TEE.  Patient denies any chest pain, shortness of breath, dyspnea fever or chills.   Objective: Vitals:   07/20/24 1206 07/20/24 1228  BP: 116/70 120/81  Pulse: 74 74  Resp: 16 17  Temp:  97.9 F (36.6 C)  SpO2: 100% 100%    Intake/Output Summary (Last 24 hours) at 07/20/2024 1309 Last data filed at 07/20/2024 1143 Gross per 24 hour  Intake 356.24 ml  Output 2000 ml  Net -1643.76 ml   Filed Weights   07/16/24 1840 07/19/24 1918 07/19/24 2318  Weight: 91 kg 95.2 kg 93.3 kg   Body mass index is 27.9 kg/m.   Physical Exam:  General:  Average built, not in obvious distress, legally blind HENT:   No scleral pallor or icterus noted. Oral mucosa is moist.  Right internal jugular hemodialysis catheter in place Chest:  Clear breath sounds.  Diminished breath sounds bilaterally. No crackles or wheezes.  CVS: S1 &S2 heard. No murmur.  Regular rate and rhythm. Abdomen: Soft, nontender, nondistended.  Bowel sounds are heard.   Extremities: No cyanosis, clubbing or edema.  Left upper extremity AV fistula in place Psych: Alert, awake and oriented, normal mood CNS: Alert plan.  Or equal equal in all extremities Skin: Warm and dry with skin pigmentation.   Data Review: I have personally reviewed the following laboratory data and studies,  CBC: Recent Labs  Lab 07/14/24 0530 07/15/24 0218 07/15/24 0910 07/16/24 0427 07/17/24 0446 07/18/24 0429 07/19/24 0618  WBC 8.4 12.4*  --  12.8* 7.4 6.4 6.2  NEUTROABS 7.4  --    --   --   --   --  4.1  HGB 11.3* 10.9* 9.5* 9.6* 9.6* 9.4* 9.1*  HCT 35.4* 33.1* 29.5* 30.1* 29.1* 28.9* 27.7*  MCV 87.0 83.2  --  85.0 82.9 83.8 82.2  PLT 236 216  --  159 182 166 190   Basic Metabolic Panel: Recent Labs  Lab 07/14/24 0530 07/15/24 0218 07/16/24 0427 07/17/24 0446 07/18/24 0429 07/19/24 0618 07/20/24 0838  NA 138   < > 132* 131* 129* 128* 131*  K 4.1   < > 4.7 3.5 3.6 4.0 4.2  CL 94*   < > 87* 87* 87* 86* 89*  CO2 25   < > 21* 26 24 20* 24  GLUCOSE 142*   < > 159* 122* 130* 155* 206*  BUN 49*   < > 83* 50* 59* 72* 46*  CREATININE 12.10*   < > 17.40* 12.20* 14.50* 16.30* 10.70*  CALCIUM  7.8*   < > 7.1* 7.7* 7.2* 7.3* 7.8*  MG 2.0  --  2.1 2.2 2.2  --   --   PHOS  --   --   --   --   --  5.6*  --    < > =  values in this interval not displayed.   Liver Function Tests: Recent Labs  Lab 07/14/24 0530 07/15/24 0218 07/19/24 0618  AST 16 24  --   ALT 10 17  --   ALKPHOS 72 64  --   BILITOT 0.4 0.4  --   PROT 8.2* 8.2*  --   ALBUMIN 4.4 4.3 3.6   Recent Labs  Lab 07/15/24 0218  LIPASE 13   No results for input(s): AMMONIA in the last 168 hours. Cardiac Enzymes: Recent Labs  Lab 07/14/24 0530  CKTOTAL 272   BNP (last 3 results) Recent Labs    08/02/23 1120  BNP 3,974.0*    ProBNP (last 3 results) No results for input(s): PROBNP in the last 8760 hours.  CBG: Recent Labs  Lab 07/19/24 1234 07/19/24 1704 07/20/24 0004 07/20/24 0813 07/20/24 1231  GLUCAP 130* 239* 218* 218* 217*   Recent Results (from the past 240 hours)  Resp panel by RT-PCR (RSV, Flu A&B, Covid) Anterior Nasal Swab     Status: None   Collection Time: 07/14/24  5:28 AM   Specimen: Anterior Nasal Swab  Result Value Ref Range Status   SARS Coronavirus 2 by RT PCR NEGATIVE NEGATIVE Final   Influenza A by PCR NEGATIVE NEGATIVE Final   Influenza B by PCR NEGATIVE NEGATIVE Final    Comment: (NOTE) The Xpert Xpress SARS-CoV-2/FLU/RSV plus assay is intended as an  aid in the diagnosis of influenza from Nasopharyngeal swab specimens and should not be used as a sole basis for treatment. Nasal washings and aspirates are unacceptable for Xpert Xpress SARS-CoV-2/FLU/RSV testing.  Fact Sheet for Patients: bloggercourse.com  Fact Sheet for Healthcare Providers: seriousbroker.it  This test is not yet approved or cleared by the [place]  FDA and has been authorized for detection and/or diagnosis of SARS-CoV-2 by FDA under an Emergency Use Authorization (EUA). This EUA will remain in effect (meaning this test can be used) for the duration of the COVID-19 declaration under Section 564(b)(1) of the Act, 21 U.S.C. section 360bbb-3(b)(1), unless the authorization is terminated or revoked.     Resp Syncytial Virus by PCR NEGATIVE NEGATIVE Final    Comment: (NOTE) Fact Sheet for Patients: bloggercourse.com  Fact Sheet for Healthcare Providers: seriousbroker.it  This test is not yet approved or cleared by the [place]  FDA and has been authorized for detection and/or diagnosis of SARS-CoV-2 by FDA under an Emergency Use Authorization (EUA). This EUA will remain in effect (meaning this test can be used) for the duration of the COVID-19 declaration under Section 564(b)(1) of the Act, 21 U.S.C. section 360bbb-3(b)(1), unless the authorization is terminated or revoked.  Performed at [facility], [address], [place], [place] [address]   Culture, blood (routine x 2)     Status: Abnormal   Collection Time: 07/15/24  3:29 AM   Specimen: BLOOD  Result Value Ref Range Status   Specimen Description BLOOD SITE NOT SPECIFIED  Final   Special Requests   Final    BOTTLES DRAWN AEROBIC AND ANAEROBIC Blood Culture results may not be optimal due to an inadequate volume of blood received in culture bottles   Culture  Setup Time   Final    GRAM  POSITIVE COCCI IN CLUSTERS IN BOTH AEROBIC AND ANAEROBIC BOTTLES CRITICAL RESULT CALLED TO, READ BACK BY AND VERIFIED WITH: PHARMD ERIN WILLIAMSON [id number] AT 1759 BY EC Performed at [facility], [address], [place], [place] [address]    Culture  STAPHYLOCOCCUS AUREUS (A)  Final   Report Status 07/17/2024 FINAL  Final   Organism ID, Bacteria STAPHYLOCOCCUS AUREUS  Final      Susceptibility   Staphylococcus aureus - MIC*    CIPROFLOXACIN  <=0.5 SENSITIVE Sensitive     ERYTHROMYCIN <=0.25 SENSITIVE Sensitive     GENTAMICIN <=0.5 SENSITIVE Sensitive     OXACILLIN 0.5 SENSITIVE Sensitive     TETRACYCLINE <=1 SENSITIVE Sensitive     VANCOMYCIN  1 SENSITIVE Sensitive     TRIMETH /SULFA  <=10 SENSITIVE Sensitive     CLINDAMYCIN <=0.25 SENSITIVE Sensitive     RIFAMPIN <=0.5 SENSITIVE Sensitive     Inducible Clindamycin NEGATIVE Sensitive     LINEZOLID 2 SENSITIVE Sensitive     * STAPHYLOCOCCUS AUREUS  Blood Culture ID Panel (Reflexed)     Status: Abnormal   Collection Time: 07/15/24  3:29 AM  Result Value Ref Range Status   Enterococcus faecalis NOT DETECTED NOT DETECTED Final   Enterococcus Faecium NOT DETECTED NOT DETECTED Final   Listeria monocytogenes NOT DETECTED NOT DETECTED Final   Staphylococcus species DETECTED (A) NOT DETECTED Final    Comment: CRITICAL RESULT CALLED TO, READ BACK BY AND VERIFIED WITH: PHARMD ERIN WILLIAMS ON [id number] AT 1759 BY EC    Staphylococcus aureus (BCID) DETECTED (A) NOT DETECTED Final    Comment: CRITICAL RESULT CALLED TO, READ BACK BY AND VERIFIED WITH: PHARMD ROCKY SLADE [id number] AT 1759 BY EC    Staphylococcus epidermidis NOT DETECTED NOT DETECTED Final   Staphylococcus lugdunensis NOT DETECTED NOT DETECTED Final   Streptococcus species NOT DETECTED NOT DETECTED Final   Streptococcus agalactiae NOT DETECTED NOT DETECTED Final   Streptococcus pneumoniae NOT DETECTED NOT DETECTED Final   Streptococcus pyogenes NOT DETECTED NOT DETECTED  Final   A.calcoaceticus-baumannii NOT DETECTED NOT DETECTED Final   Bacteroides fragilis NOT DETECTED NOT DETECTED Final   Enterobacterales NOT DETECTED NOT DETECTED Final   Enterobacter cloacae complex NOT DETECTED NOT DETECTED Final   Escherichia coli NOT DETECTED NOT DETECTED Final   Klebsiella aerogenes NOT DETECTED NOT DETECTED Final   Klebsiella oxytoca NOT DETECTED NOT DETECTED Final   Klebsiella pneumoniae NOT DETECTED NOT DETECTED Final   Proteus species NOT DETECTED NOT DETECTED Final   Salmonella species NOT DETECTED NOT DETECTED Final   Serratia marcescens NOT DETECTED NOT DETECTED Final   Haemophilus influenzae NOT DETECTED NOT DETECTED Final   Neisseria meningitidis NOT DETECTED NOT DETECTED Final   Pseudomonas aeruginosa NOT DETECTED NOT DETECTED Final   Stenotrophomonas maltophilia NOT DETECTED NOT DETECTED Final   Candida albicans NOT DETECTED NOT DETECTED Final   Candida auris NOT DETECTED NOT DETECTED Final   Candida glabrata NOT DETECTED NOT DETECTED Final   Candida krusei NOT DETECTED NOT DETECTED Final   Candida parapsilosis NOT DETECTED NOT DETECTED Final   Candida tropicalis NOT DETECTED NOT DETECTED Final   Cryptococcus neoformans/gattii NOT DETECTED NOT DETECTED Final   Meth resistant mecA/C and MREJ NOT DETECTED NOT DETECTED Final    Comment: Performed at [facility], [address], [place], [place] [address]  Respiratory (~20 pathogens) panel by PCR     Status: None   Collection Time: 07/15/24  8:03 AM   Specimen: Nasopharyngeal Swab; Respiratory  Result Value Ref Range Status   Adenovirus NOT DETECTED NOT DETECTED Final   Coronavirus 229E NOT DETECTED NOT DETECTED Final    Comment: (NOTE) The Coronavirus on the Respiratory Panel, DOES NOT test for the novel  Coronavirus (2019 nCoV)  Coronavirus HKU1 NOT DETECTED NOT DETECTED Final   Coronavirus NL63 NOT DETECTED NOT DETECTED Final   Coronavirus OC43 NOT DETECTED NOT DETECTED Final    Metapneumovirus NOT DETECTED NOT DETECTED Final   Rhinovirus / Enterovirus NOT DETECTED NOT DETECTED Final   Influenza A NOT DETECTED NOT DETECTED Final   Influenza B NOT DETECTED NOT DETECTED Final   Parainfluenza Virus 1 NOT DETECTED NOT DETECTED Final   Parainfluenza Virus 2 NOT DETECTED NOT DETECTED Final   Parainfluenza Virus 3 NOT DETECTED NOT DETECTED Final   Parainfluenza Virus 4 NOT DETECTED NOT DETECTED Final   Respiratory Syncytial Virus NOT DETECTED NOT DETECTED Final   Bordetella pertussis NOT DETECTED NOT DETECTED Final   Bordetella Parapertussis NOT DETECTED NOT DETECTED Final   Chlamydophila pneumoniae NOT DETECTED NOT DETECTED Final   Mycoplasma pneumoniae NOT DETECTED NOT DETECTED Final    Comment: Performed at [facility], [address], [place], [place] [address]  Culture, blood (routine x 2)     Status: Abnormal   Collection Time: 07/15/24  3:14 PM   Specimen: BLOOD RIGHT HAND  Result Value Ref Range Status   Specimen Description BLOOD RIGHT HAND  Final   Special Requests   Final    BOTTLES DRAWN AEROBIC AND ANAEROBIC Blood Culture results may not be optimal due to an inadequate volume of blood received in culture bottles   Culture  Setup Time   Final    GRAM POSITIVE COCCI IN BOTH AEROBIC AND ANAEROBIC BOTTLES CRITICAL VALUE NOTED.  VALUE IS CONSISTENT WITH PREVIOUSLY REPORTED AND CALLED VALUE.    Culture (A)  Final    STAPHYLOCOCCUS AUREUS SUSCEPTIBILITIES PERFORMED ON PREVIOUS CULTURE WITHIN THE LAST 5 DAYS. Performed at [facility], [address] [place], [place] [address]    Report Status 07/18/2024 FINAL  Final  Culture, blood (Routine X 2) w Reflex to ID Panel     Status: None (Preliminary result)   Collection Time: 07/16/24  8:13 PM   Specimen: BLOOD RIGHT ARM  Result Value Ref Range Status   Specimen Description BLOOD RIGHT ARM  Final   Special Requests   Final    BOTTLES DRAWN AEROBIC AND ANAEROBIC Blood Culture adequate volume    Culture   Final    NO GROWTH 4 DAYS Performed at [facility], [address] [place], [place] [address]    Report Status PENDING  Incomplete  Culture, blood (Routine X 2) w Reflex to ID Panel     Status: None (Preliminary result)   Collection Time: 07/16/24  8:18 PM   Specimen: BLOOD RIGHT HAND  Result Value Ref Range Status   Specimen Description BLOOD RIGHT HAND  Final   Special Requests   Final    BOTTLES DRAWN AEROBIC AND ANAEROBIC Blood Culture adequate volume   Culture   Final    NO GROWTH 4 DAYS Performed at [facility], [address] [place], [place] [address]    Report Status PENDING  Incomplete  Culture, blood (Routine X 2) w Reflex to ID Panel     Status: None (Preliminary result)   Collection Time: 07/18/24 10:22 AM   Specimen: BLOOD RIGHT HAND  Result Value Ref Range Status   Specimen Description BLOOD RIGHT HAND  Final   Special Requests   Final    BOTTLES DRAWN AEROBIC AND ANAEROBIC Blood Culture adequate volume   Culture   Final    NO GROWTH 2 DAYS Performed  at [facility], [address], [place], [place] [address]    Report Status PENDING  Incomplete  Culture, blood (Routine X 2) w Reflex to ID Panel     Status: None (Preliminary result)   Collection Time: 07/18/24 10:22 AM   Specimen: BLOOD RIGHT ARM  Result Value Ref Range Status   Specimen Description BLOOD RIGHT ARM  Final   Special Requests   Final    BOTTLES DRAWN AEROBIC AND ANAEROBIC Blood Culture adequate volume   Culture   Final    NO GROWTH 2 DAYS Performed at [facility], [address] [place], [place] [address]    Report Status PENDING  Incomplete     Studies: EP STUDY Result Date: 07/20/2024 See surgical note for result.  DG Chest Port 1 View Result Date: 07/19/2024 CLINICAL DATA:  Status post dialysis catheter placement. EXAM: PORTABLE CHEST 1 VIEW COMPARISON:  July 15, 2023 FINDINGS: The heart size and mediastinal contours are within normal limits.  Right internal jugular dialysis catheter is noted with distal tip in expected position of cavoatrial junction. Both lungs are clear. The visualized skeletal structures are unremarkable. IMPRESSION: Right internal jugular dialysis catheter is noted with distal tip in expected position of cavoatrial junction. Electronically Signed   By: Lynwood Landy Raddle M.D.   On: 07/19/2024 13:21   HYBRID OR IMAGING ([facility] ONLY) Result Date: 07/19/2024 There is no interpretation for this exam.  This order is for images obtained during a surgical procedure.  Please See Surgeries Tab for more information regarding the procedure.   ECHOCARDIOGRAM COMPLETE BUBBLE STUDY Result Date: 07/18/2024    ECHOCARDIOGRAM REPORT   Patient Name:   [name] Date of Exam: 07/18/2024 Medical Rec #:  [id number]       Height:       72.0 in Accession #:    [id number]      Weight:       200.6 lb Date of Birth:  Oct 07, 1985       BSA:          2.133 m Patient Age:    38 years        BP:           150/89 mmHg Patient Gender: M               HR:           77 bpm. Exam Location:  Inpatient Procedure: 2D Echo, Cardiac Doppler, Color Doppler and Saline Contrast Bubble            Study (Both Spectral and Color Flow Doppler were utilized during            procedure). Indications:    Bacteremia  History:        Patient has prior history of Echocardiogram examinations, most                 recent 10/28/2022. Risk Factors:Hypertension.  Sonographer:    Nathanel Devonshire Referring Phys: [id number] GREGORY D CALONE IMPRESSIONS  1. Left ventricular ejection fraction, by estimation, is 60 to 65%. The left ventricle has normal function. The left ventricle has no regional wall motion abnormalities. There is moderate concentric left ventricular hypertrophy. Left ventricular diastolic parameters are consistent with Grade II diastolic dysfunction (pseudonormalization).  2. Right ventricular systolic function is normal. The right ventricular size is normal.  3. Left atrial size was  mildly dilated.  4. A small pericardial effusion is present. The  pericardial effusion is posterior to the left ventricle. There is no evidence of cardiac tamponade.  5. There is a large rounded calcified mass (1.9x1.3cm) attached to the atrial surface of posterior leaflet/annulus of the mitral valve. The mitral valve is normal in structure. Trivial mitral valve regurgitation. No evidence of mitral stenosis.  6. The aortic valve is tricuspid. Aortic valve regurgitation is not visualized. Aortic valve sclerosis/calcification is present, without any evidence of aortic stenosis.  7. The inferior vena cava is dilated in size with >50% respiratory variability, suggesting right atrial pressure of 8 mmHg.  8. Agitated saline contrast bubble study was negative, with no evidence of any interatrial shunt. 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 In setting of bacteremia, this raises concern for vegetation. FINDINGS  Left Ventricle: Left ventricular ejection fraction, by estimation, is 60 to 65%. The left ventricle has normal function. The left ventricle has no regional wall motion abnormalities. The left ventricular internal cavity size was normal in size. There is  moderate concentric left ventricular hypertrophy. Left ventricular diastolic parameters are consistent with Grade II diastolic dysfunction (pseudonormalization). Right Ventricle: The right ventricular size is normal. No increase in right ventricular wall thickness. Right ventricular systolic function is normal. Left Atrium: Left atrial size was mildly dilated. Right Atrium: Right atrial size was normal in size. Pericardium: A small pericardial effusion is present. The pericardial effusion is posterior to the left ventricle. There is no evidence of cardiac tamponade. Mitral Valve: There is a large rounded calcified mass (1.9x1.3cm) attached to the atrial surface of posterior leaflet/annulus of the mitral valve. The mitral valve  is normal in structure. Trivial mitral valve regurgitation. No evidence of mitral valve stenosis. MV peak gradient, 3.8 mmHg. The mean mitral valve gradient is 2.0 mmHg. Tricuspid Valve: The tricuspid valve is normal in structure. Tricuspid valve regurgitation is mild . No evidence of tricuspid stenosis. Aortic Valve: The aortic valve is tricuspid. Aortic valve regurgitation is not visualized. Aortic valve sclerosis/calcification is present, without any evidence of aortic stenosis. Aortic valve mean gradient measures 4.0 mmHg. Aortic valve peak gradient measures 6.6 mmHg. Aortic valve area, by VTI measures 3.40 cm. Pulmonic Valve: The pulmonic valve was normal in structure. Pulmonic valve regurgitation is mild. No evidence of pulmonic stenosis. Aorta: The aortic root is normal in size and structure. Venous: The inferior vena cava is dilated in size with greater than 50% respiratory variability, suggesting right atrial pressure of 8 mmHg. IAS/Shunts: No atrial level shunt detected by color flow Doppler. Agitated saline contrast was given intravenously to evaluate for intracardiac shunting. Agitated saline contrast bubble study was negative, with no evidence of any interatrial shunt.  LEFT VENTRICLE PLAX 2D LVIDd:         4.60 cm      Diastology LVIDs:         3.10 cm      LV e' medial:    4.46 cm/s LV PW:         1.40 cm      LV E/e' medial:  24.9 LV IVS:        1.30 cm      LV e' lateral:   5.00 cm/s LVOT diam:     2.20 cm      LV E/e' lateral: 22.2 LV SV:         77 LV SV Index:   36 LVOT Area:     3.80 cm LV IVRT:       70 msec  LV Volumes (MOD) LV vol d, MOD A2C: 85.8 ml LV vol d, MOD A4C: 105.0 ml LV vol s, MOD A2C: 32.2 ml LV vol s, MOD A4C: 34.0 ml LV SV MOD A2C:     53.6 ml LV SV MOD A4C:     105.0 ml LV SV MOD BP:      62.5 ml RIGHT VENTRICLE          IVC RV Basal diam:  3.50 cm  IVC diam: 2.10 cm TAPSE (M-mode): 2.4 cm                          PULMONARY VEINS                          Diastolic Velocity:  45.00 cm/s                          S/D Velocity:       0.70                          Systolic Velocity:  32.60 cm/s LEFT ATRIUM             Index LA diam:        3.30 cm 1.55 cm/m LA Vol (A2C):   58.5 ml 27.42 ml/m LA Vol (A4C):   54.3 ml 25.45 ml/m LA Biplane Vol: 57.2 ml 26.81 ml/m  AORTIC VALVE AV Area (Vmax):    3.03 cm AV Area (Vmean):   2.92 cm AV Area (VTI):     3.40 cm AV Vmax:           128.00 cm/s AV Vmean:          87.700 cm/s AV VTI:            0.227 m AV Peak Grad:      6.6 mmHg AV Mean Grad:      4.0 mmHg LVOT Vmax:         102.00 cm/s LVOT Vmean:        67.300 cm/s LVOT VTI:          0.203 m LVOT/AV VTI ratio: 0.89  AORTA Ao Root diam: 3.30 cm Ao Asc diam:  2.80 cm MITRAL VALVE MV Area (PHT): 4.86 cm     SHUNTS MV Area VTI:   3.00 cm     Systemic VTI:  0.20 m MV Peak grad:  3.8 mmHg     Systemic Diam: 2.20 cm MV Mean grad:  2.0 mmHg MV Vmax:       0.98 m/s MV Vmean:      59.6 cm/s MV Decel Time: 156 msec MV E velocity: 111.00 cm/s MV A velocity: 104.00 cm/s MV E/A ratio:  1.07 Toribio Fuel MD Electronically signed by Toribio Fuel MD Signature Date/Time: 07/18/2024/3:30:32 PM    Final        Vernal Alstrom, MD  [facility] 07/20/2024  If 7PM-7AM, please contact night-coverage             "

## 2024-07-20 NOTE — Progress Notes (Signed)
 Contacted by pharmacist that pt will need iv cefazolin  at d/c. Contacted [facility] who confirms that they have abx in stock and can provide to pt at d/c. Pharmacy note for today faxed to clinic for recs/review. Will assist as needed.   Randine Mungo Dialysis Navigator [phone]

## 2024-07-20 NOTE — Progress Notes (Signed)
 " [facility] Progress Note   Subjective:   Seen in room - eating lunch. Endorses mild CP, no dyspnea. 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  Objective Vitals:   07/20/24 1146 07/20/24 1156 07/20/24 1206 07/20/24 1228  BP: (!) 73/59 (!) 87/78 116/70 120/81  Pulse: 73 71 74 74  Resp: 20 20 16 17   Temp: 97.8 F (36.6 C)   97.9 F (36.6 C)  TempSrc: Tympanic   Oral  SpO2: 98% 99% 100% 100%  Weight:      Height:       Physical Exam General: Well appearing, NAD. Room air Heart: RRR; no murmur Lungs: CTAB; no rales Abdomen: soft Extremities: no LE edema Dialysis Access: TDC +  LUE AVF +b/t (deep in prox arm)-cannot be used until March per VVS  Additional Objective Labs: Basic Metabolic Panel: Recent Labs  Lab 07/18/24 0429 07/19/24 0618 07/20/24 0838  NA 129* 128* 131*  K 3.6 4.0 4.2  CL 87* 86* 89*  CO2 24 20* 24  GLUCOSE 130* 155* 206*  BUN 59* 72* 46*  CREATININE 14.50* 16.30* 10.70*  CALCIUM  7.2* 7.3* 7.8*  PHOS  --  5.6*  --    Liver Function Tests: Recent Labs  Lab 07/14/24 0530 07/15/24 0218 07/19/24 0618  AST 16 24  --   ALT 10 17  --   ALKPHOS 72 64  --   BILITOT 0.4 0.4  --   PROT 8.2* 8.2*  --   ALBUMIN 4.4 4.3 3.6   Recent Labs  Lab 07/15/24 0218  LIPASE 13   CBC: Recent Labs  Lab 07/14/24 0530 07/15/24 0218 07/15/24 0910 07/16/24 0427 07/17/24 0446 07/18/24 0429 07/19/24 0618  WBC 8.4 12.4*  --  12.8* 7.4 6.4 6.2  NEUTROABS 7.4  --   --   --   --   --  4.1  HGB 11.3* 10.9*   < > 9.6* 9.6* 9.4* 9.1*  HCT 35.4* 33.1*   < > 30.1* 29.1* 28.9* 27.7*  MCV 87.0 83.2  --  85.0 82.9 83.8 82.2  PLT 236 216  --  159 182 166 190   < > = values in this interval not displayed.   Blood Culture    Component Value Date/Time   SDES BLOOD RIGHT HAND 07/18/2024 1022   SDES BLOOD RIGHT ARM 07/18/2024 1022   SPECREQUEST  07/18/2024 1022    BOTTLES DRAWN AEROBIC AND ANAEROBIC Blood Culture  adequate volume   SPECREQUEST  07/18/2024 1022    BOTTLES DRAWN AEROBIC AND ANAEROBIC Blood Culture adequate volume   CULT  07/18/2024 1022    NO GROWTH 2 DAYS Performed at [facility], [address], [place], [place] [address]    CULT  07/18/2024 1022    NO GROWTH 2 DAYS Performed at [facility], [address] [place], [place] [address]    REPTSTATUS PENDING 07/18/2024 1022   REPTSTATUS PENDING 07/18/2024 1022   Studies/Results: ECHO TEE Result Date: 07/20/2024    TRANSESOPHOGEAL ECHO REPORT   Patient Name:   [name] Date of Exam: 07/20/2024 Medical Rec #:  [id number]       Height:       72.0 in Accession #:    [id number]      Weight:       205.7 lb Date of Birth:  11/19/1985       BSA:  2.156 m Patient Age:    38 years        BP:           123/77 mmHg Patient Gender: M               HR:           79 bpm. Exam Location:  Inpatient Procedure: Transesophageal Echo, 3D Echo, Cardiac Doppler and Color Doppler            (Both Spectral and Color Flow Doppler were utilized during            procedure). Indications:     I34.8 Other nonrheumatic mitral valve disorders  History:         Patient has prior history of Echocardiogram examinations, most                  recent 07/18/2024. Hypertrophic Cardiomyopathy; Risk                  Factors:Diabetes and Hypertension.  Sonographer:     Jayson Gaskins Referring Phys:  [id number] SHIRL FRUITS Diagnosing Phys: Annabella Scarce MD PROCEDURE: After discussion of the risks and benefits of a TEE, an informed consent was obtained from the patient. The transesophogeal probe was passed without difficulty through the esophogus of the patient. Sedation performed by different physician. The patient was monitored while under deep sedation. Anesthestetic sedation was provided intravenously by Anesthesiology: 230mg  of Propofol , 50mg  of Lidocaine . The patient's vital signs; including heart rate, blood pressure, and oxygen  saturation; remained stable  throughout the procedure. The patient developed no complications during the procedure.  IMPRESSIONS  1. Transgastric images were not obtained. There was resistance at the gastroesophageal junction and he began retching. Given his history of Mallory Weiss tear, these images were deferred.  2. Left ventricular ejection fraction, by estimation, is 55 to 60%. The left ventricle has normal function. The left ventricle has no regional wall motion abnormalities.  3. Right ventricular systolic function is normal. The right ventricular size is normal.  4. Left atrial size was mildly dilated. No left atrial/left atrial appendage thrombus was detected.  5. Round, mobile density measuring 1.3 x 1.0 cm on the atrial side of the posterior mitral valve. Concerning for endocarditis. Not seen on TEE 10/2022. The mitral valve is abnormal. Trivial mitral valve regurgitation. No evidence of mitral stenosis.  6. Tiny mobile density on the right coronary cusp measuring 0.2 x 0.2 cm. The aortic valve is tricuspid. Aortic valve regurgitation is trivial. No aortic stenosis is present.  7. The inferior vena cava is normal in size with greater than 50% respiratory variability, suggesting right atrial pressure of 3 mmHg.  8. 3D performed of the aortic valve and demonstrates Tricuspid valve with a tiny vegetation. Conclusion(s)/Recommendation(s): Findings are concerning for vegetation/infective endocarditis as detailed above. Findings concerning for mitral valve vegetation. FINDINGS  Left Ventricle: Left ventricular ejection fraction, by estimation, is 55 to 60%. The left ventricle has normal function. The left ventricle has no regional wall motion abnormalities. The left ventricular internal cavity size was normal in size. There is  no left ventricular hypertrophy. Right Ventricle: The right ventricular size is normal. No increase in right ventricular wall thickness. Right ventricular systolic function is normal. Left Atrium: Left atrial size  was mildly dilated. No left atrial/left atrial appendage thrombus was detected. Right Atrium: Right atrial size was normal in size. Pericardium: There is no evidence of pericardial effusion. Mitral Valve: Round, mobile density measuring  1.3 x 1.0 cm on the atrial side of the posterior mitral valve. Concerning for endocarditis. Not seen on TEE 10/2022. The mitral valve is abnormal. Trivial mitral valve regurgitation. No evidence of mitral valve stenosis. Tricuspid Valve: The tricuspid valve is normal in structure. Tricuspid valve regurgitation is trivial. No evidence of tricuspid stenosis. Aortic Valve: Tiny mobile density on the right coronary cusp measuring 0.2 x 0.2 cm. The aortic valve is tricuspid. Aortic valve regurgitation is trivial. No aortic stenosis is present. Pulmonic Valve: The pulmonic valve was normal in structure. Pulmonic valve regurgitation is trivial. No evidence of pulmonic stenosis. Aorta: The aortic root and ascending aorta are structurally normal, with no evidence of dilitation. Venous: The inferior vena cava is normal in size with greater than 50% respiratory variability, suggesting right atrial pressure of 3 mmHg. IAS/Shunts: No atrial level shunt detected by color flow Doppler. Additional Comments: 3D was performed not requiring image post processing on an independent workstation and was abnormal. TRICUSPID VALVE TR Peak grad:   24.8 mmHg TR Vmax:        249.00 cm/s Annabella Scarce MD Electronically signed by Annabella Scarce MD Signature Date/Time: 07/20/2024/2:00:17 PM    Final    EP STUDY Result Date: 07/20/2024 See surgical note for result.  DG Chest Port 1 View Result Date: 07/19/2024 CLINICAL DATA:  Status post dialysis catheter placement. EXAM: PORTABLE CHEST 1 VIEW COMPARISON:  July 15, 2023 FINDINGS: The heart size and mediastinal contours are within normal limits. Right internal jugular dialysis catheter is noted with distal tip in expected position of cavoatrial junction.  Both lungs are clear. The visualized skeletal structures are unremarkable. IMPRESSION: Right internal jugular dialysis catheter is noted with distal tip in expected position of cavoatrial junction. Electronically Signed   By: Lynwood Landy Raddle M.D.   On: 07/19/2024 13:21   HYBRID OR IMAGING ([facility] ONLY) Result Date: 07/19/2024 There is no interpretation for this exam.  This order is for images obtained during a surgical procedure.  Please See Surgeries Tab for more information regarding the procedure.   Medications:   ceFAZolin  (ANCEF ) IV Stopped (07/19/24 1748)    atorvastatin   40 mg Oral QPC supper   Chlorhexidine  Gluconate Cloth  6 each Topical Q0600   cloNIDine   0.1 mg Oral BID   heparin  injection (subcutaneous)  5,000 Units Subcutaneous Q8H   insulin  aspart  0-6 Units Subcutaneous TID WC   insulin  glargine-yfgn  7 Units Subcutaneous QHS   labetalol   200 mg Oral BID   minoxidil   2.5 mg Oral BID   pantoprazole   40 mg Oral BID   sucralfate   1 g Oral QID    Dialysis Orders TTS - [facility] 4hr, 400/500, EDW 92kg, 1K/2.5Ca bath, TDC, heparin  1000 + 1000/hr - Mircera 30mcg IV q 2 weeks - no VDRA   Assessment/Plan: Staph aureus bacteremia: Blood Cx 1/4 positive, on Cefazolin . S/p line holiday 1/6 - 07/19/24. Endocarditis on TEE, remains on Cefazolin . Abd pain with N/V: ?Related to #1, CTA without mesenteric ischemia. On PPI + sucralfate . Improving. 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 Secondary HPTH: Ca slightly low, Phos slightly high - follow. T2DM: Per primary.   Izetta Boehringer, PA-C 07/20/2024, 2:17 PM  [facility]    "

## 2024-07-20 NOTE — Plan of Care (Signed)
 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    Labs weekly while on IV antibiotics: _x_ CBC with differential __ BMP _x_ CMP __ CRP __ ESR __ Vancomycin  trough __ CK   Fax weekly labs to [phone]  Clinic Follow Up Appt: 1/26 @ 1045  @  [facility] [address], [place], [place] [address] Phone: [phone]    ----------- Subjective: No event Tee av/mv endocarditis   Objectives:  Vitals:   07/20/24 1206 07/20/24 1228  BP: 116/70 120/81  Pulse: 74 74  Resp: 16 17  Temp:  97.9 F (36.6 C)  SpO2: 100% 100%   Lab Results  Component Value Date   WBC 6.2 07/19/2024   HGB 9.1 (L) 07/19/2024   HCT 27.7 (L) 07/19/2024   MCV 82.2 07/19/2024   PLT 190 07/19/2024   Last metabolic panel Lab Results  Component Value Date   GLUCOSE 206 (H) 07/20/2024   NA 131 (L) 07/20/2024   K 4.2 07/20/2024   CL 89 (L) 07/20/2024   CO2 24 07/20/2024   BUN 46 (H) 07/20/2024   CREATININE 10.70 (H) 07/20/2024   GFRNONAA 6 (L) 07/20/2024   CALCIUM  7.8 (L) 07/20/2024   PHOS 5.6 (H) 07/19/2024   PROT 8.2 (H) 07/15/2024   ALBUMIN 3.6 07/19/2024   LABGLOB 3.1 09/16/2020   AGRATIO 1.2 09/16/2020   BILITOT 0.4 07/15/2024   ALKPHOS 64 07/15/2024   AST 24 07/15/2024   ALT 17 07/15/2024   ANIONGAP 17 (H) 07/20/2024     Imaging: Tee reviewed     I spent 15 minutes reviewing chart and discussing plan/tx/follow up with primary team. Evaluation of this patient requires complex antimicrobial therapy evaluation and  counseling + isolation needs for disease transmission risk assessment and mitigation

## 2024-07-21 ENCOUNTER — Encounter (HOSPITAL_COMMUNITY): Payer: Self-pay | Admitting: Cardiovascular Disease

## 2024-07-21 LAB — CULTURE, BLOOD (ROUTINE X 2)
Culture: NO GROWTH
Culture: NO GROWTH
Special Requests: ADEQUATE
Special Requests: ADEQUATE

## 2024-07-23 ENCOUNTER — Ambulatory Visit: Payer: Self-pay | Admitting: Family

## 2024-07-23 ENCOUNTER — Telehealth: Payer: Self-pay | Admitting: *Deleted

## 2024-07-23 LAB — CULTURE, BLOOD (ROUTINE X 2)
Culture: NO GROWTH
Culture: NO GROWTH
Special Requests: ADEQUATE
Special Requests: ADEQUATE

## 2024-07-23 NOTE — Transitions of Care (Post Inpatient/ED Visit) (Signed)
" ° °  07/23/2024  Name: [name] MRN: [id number] DOB: 1986-04-07  Today's TOC FU Call Status: Today's TOC FU Call Status:: Unsuccessful Call (1st Attempt) Unsuccessful Call (1st Attempt) Date: 07/23/24  Attempted to reach the patient regarding the most recent Inpatient/ED visit.  Follow Up Plan: Additional outreach attempts will be made to reach the patient to complete the Transitions of Care (Post Inpatient/ED visit) call.   Andrea Dimes RN, BSN [place]  [facility] RN Care Manager [phone]  "

## 2024-07-23 NOTE — Progress Notes (Signed)
 Late Note Entry- Jul 23, 2024  Pt was d/c late on Friday afternoon. Contacted [facility]  who confirms pt came for treatment on Saturday. Clinic received pharmacy note on Friday and clinic will contact navigator if other clinicals are needed. Clinic may already have d/c summary per staff.   Randine Mungo Dialysis Navigator [phone]

## 2024-07-25 ENCOUNTER — Telehealth: Payer: Self-pay | Admitting: *Deleted

## 2024-07-25 NOTE — Transitions of Care (Post Inpatient/ED Visit) (Signed)
" ° °  07/25/2024  Name: [name] MRN: [id number] DOB: 01/31/1986  Today's TOC FU Call Status: Today's TOC FU Call Status:: Unsuccessful Call (2nd Attempt) Unsuccessful Call (2nd Attempt) Date: 07/25/24  Attempted to reach the patient regarding the most recent Inpatient/ED visit.  Follow Up Plan: Additional outreach attempts will be made to reach the patient to complete the Transitions of Care (Post Inpatient/ED visit) call.   Andrea Dimes RN, BSN [place]  [facility] RN Care Manager [phone]  "

## 2024-07-26 ENCOUNTER — Telehealth: Payer: Self-pay | Admitting: *Deleted

## 2024-07-26 NOTE — Transitions of Care (Post Inpatient/ED Visit) (Signed)
" ° °  07/26/2024  Name: [name] MRN: [id number] DOB: Jul 09, 1986  Today's TOC FU Call Status: Today's TOC FU Call Status:: Successful TOC FU Call Completed TOC FU Call Complete Date: 07/26/24  Patient's Name and Date of Birth confirmed. Name, DOB  Transition Care Management Follow-up Telephone Call Date of Discharge: 07/20/24 Discharge Facility: [facility]) Type of Discharge: Inpatient Admission Primary Inpatient Discharge Diagnosis:: Intractable nausea and vomiting How have you been since you were released from the hospital?: Better Any questions or concerns?: No  Items Reviewed: Did you receive and understand the discharge instructions provided?: Yes Medications obtained,verified, and reconciled?: No Medications Not Reviewed Reasons::  (Patient ended the call prior to Medication review) Dietary orders reviewed?: Yes Type of Diet Ordered:: renal diet, heart healthy, carb modified Do you have support at home?: No  Medications Reviewed Today:  Medications Reviewed Today   Medications were not reviewed in this encounter     Home Care and Equipment/Supplies: Were Home Health Services Ordered?: No Any new equipment or medical supplies ordered?: No  Functional Questionnaire: Do you need assistance with bathing/showering or dressing?: No Do you need assistance with meal preparation?: No Do you need assistance with eating?: No Do you have difficulty maintaining continence: No Do you need assistance with getting out of bed/getting out of a chair/moving?: No Do you have difficulty managing or taking your medications?: No  Follow up appointments reviewed: PCP Follow-up appointment confirmed?: No (Patient prefers to call and schedule with PCP) MD Provider Line Number:[phone] Given: No Specialist Hospital Follow-up appointment confirmed?: Yes Date of Specialist follow-up appointment?: 08/01/24 Follow-Up Specialty Provider:: Vein and Vascular  Patient ended the  telephone call prior to Assessment completion, SDOH assessment and medication review. RNCM was unable to reconnect with [name].  James Dimes RN, BSN [place]  [facility] RN Care Manager [phone]  "

## 2024-07-27 ENCOUNTER — Ambulatory Visit

## 2024-07-29 NOTE — Progress Notes (Unsigned)
" °  Cardiology Office Note:  .   Date:  07/29/2024  ID:  [name], DOB December 20, 1985, MRN [id number] PCP: Jaycee Greig PARAS, NP  [facility] Providers Cardiologist:  Oneil Parchment, MD   History of Present Illness: .   No chief complaint on file.   ISSAC Velez is a 39 y.o. male with below history who presents for the evaluation of HTN, Hypertensive heart disease at the request of Jaycee Greig PARAS, NP.   History of Present Illness              Problem List Severe LVH ESRD HTN MV/AV endocarditis  -07/20/2024 5. DM    ROS: All other ROS reviewed and negative. Pertinent positives noted in the HPI.     Studies Reviewed: SABRA       TTE 07/18/2024  1. Left ventricular ejection fraction, by estimation, is 60 to 65%. The  left ventricle has normal function. The left ventricle has no regional  wall motion abnormalities. There is moderate concentric left ventricular  hypertrophy. Left ventricular  diastolic parameters are consistent with Grade II diastolic dysfunction  (pseudonormalization).   2. Right ventricular systolic function is normal. The right ventricular  size is normal.   3. Left atrial size was mildly dilated.   4. A small pericardial effusion is present. The pericardial effusion is  posterior to the left ventricle. There is no evidence of cardiac  tamponade.   5. There is a large rounded calcified mass (1.9x1.3cm) attached to the  atrial surface of posterior leaflet/annulus of the mitral valve. The  mitral valve is normal in structure. Trivial mitral valve regurgitation.  No evidence of mitral stenosis.   6. The aortic valve is tricuspid. Aortic valve regurgitation is not  visualized. Aortic valve sclerosis/calcification is present, without any  evidence of aortic stenosis.   7. The inferior vena cava is dilated in size with >50% respiratory  variability, suggesting right atrial pressure of 8 mmHg.   8. Agitated saline contrast bubble study was negative, with no  evidence  of any interatrial shunt.  Physical Exam:   VS:  There were no vitals taken for this visit.   Wt Readings from Last 3 Encounters:  07/19/24 205 lb 11 oz (93.3 kg)  07/14/24 200 lb (90.7 kg)  06/22/24 205 lb 0.4 oz (93 kg)    GEN: Well nourished, well developed in no acute distress NECK: No JVD; No carotid bruits CARDIAC: ***RRR, no murmurs, rubs, gallops RESPIRATORY:  Clear to auscultation without rales, wheezing or rhonchi  ABDOMEN: Soft, non-tender, non-distended EXTREMITIES:  No edema; No deformity  ASSESSMENT AND PLAN: .   Assessment and Plan                 {Are you ordering a CV Procedure (e.g. stress test, cath, DCCV, TEE, etc)?   Press F2        :[id number]}   Follow-up: No follow-ups on file.  Signed, Darryle DASEN. Barbaraann, MD, [facility]  [facility]  [address] [place], [place] [address] [phone]  1:07 PM   "

## 2024-08-01 ENCOUNTER — Ambulatory Visit (HOSPITAL_COMMUNITY)

## 2024-08-03 ENCOUNTER — Ambulatory Visit: Admitting: Cardiovascular Disease

## 2024-08-03 ENCOUNTER — Ambulatory Visit

## 2024-08-03 DIAGNOSIS — I330 Acute and subacute infective endocarditis: Secondary | ICD-10-CM

## 2024-08-03 DIAGNOSIS — I119 Hypertensive heart disease without heart failure: Secondary | ICD-10-CM

## 2024-08-03 DIAGNOSIS — I517 Cardiomegaly: Secondary | ICD-10-CM

## 2024-08-06 ENCOUNTER — Ambulatory Visit: Admitting: Family

## 2024-08-10 ENCOUNTER — Ambulatory Visit

## 2024-08-15 ENCOUNTER — Encounter: Payer: Self-pay | Admitting: *Deleted

## 2024-08-29 ENCOUNTER — Ambulatory Visit: Admitting: Internal Medicine

## 2024-09-05 ENCOUNTER — Encounter

## 2024-09-05 ENCOUNTER — Ambulatory Visit (HOSPITAL_COMMUNITY)

## 2024-09-19 ENCOUNTER — Ambulatory Visit: Admitting: Family
